# Patient Record
Sex: Female | Born: 1970
Health system: Southern US, Community
[De-identification: ages and names within clinical notes are randomized; demographics above are authoritative.]

## PROBLEM LIST (undated history)

## (undated) VITALS — BP 122/81 | HR 64 | Temp 98.1°F | Resp 18 | Ht 60.75 in | Wt 295.0 lb

## (undated) DIAGNOSIS — K589 Irritable bowel syndrome without diarrhea: Secondary | ICD-10-CM

## (undated) DIAGNOSIS — E119 Type 2 diabetes mellitus without complications: Secondary | ICD-10-CM

## (undated) DIAGNOSIS — F419 Anxiety disorder, unspecified: Secondary | ICD-10-CM

## (undated) DIAGNOSIS — D649 Anemia, unspecified: Secondary | ICD-10-CM

## (undated) DIAGNOSIS — F329 Major depressive disorder, single episode, unspecified: Secondary | ICD-10-CM

## (undated) DIAGNOSIS — G43009 Migraine without aura, not intractable, without status migrainosus: Secondary | ICD-10-CM

## (undated) DIAGNOSIS — G4730 Sleep apnea, unspecified: Secondary | ICD-10-CM

## (undated) DIAGNOSIS — I10 Essential (primary) hypertension: Secondary | ICD-10-CM

## (undated) DIAGNOSIS — F319 Bipolar disorder, unspecified: Secondary | ICD-10-CM

## (undated) DIAGNOSIS — E669 Obesity, unspecified: Secondary | ICD-10-CM

## (undated) DIAGNOSIS — F32A Depression, unspecified: Secondary | ICD-10-CM

## (undated) DIAGNOSIS — F70 Mild intellectual disabilities: Secondary | ICD-10-CM

## (undated) DIAGNOSIS — G40219 Localization-related (focal) (partial) symptomatic epilepsy and epileptic syndromes with complex partial seizures, intractable, without status epilepticus: Secondary | ICD-10-CM

## (undated) DIAGNOSIS — R569 Unspecified convulsions: Secondary | ICD-10-CM

## (undated) DIAGNOSIS — I639 Cerebral infarction, unspecified: Secondary | ICD-10-CM

## (undated) HISTORY — DX: Migraine without aura, not intractable, without status migrainosus: G43.009

## (undated) HISTORY — DX: Type 2 diabetes mellitus without complications: E11.9

## (undated) HISTORY — DX: Sleep apnea, unspecified: G47.30

## (undated) HISTORY — DX: Essential (primary) hypertension: I10

## (undated) HISTORY — DX: Localization-related (focal) (partial) symptomatic epilepsy and epileptic syndromes with complex partial seizures, intractable, without status epilepticus: G40.219

## (undated) HISTORY — DX: Obesity, unspecified: E66.9

## (undated) HISTORY — DX: Anemia, unspecified: D64.9

## (undated) HISTORY — DX: Mild intellectual disabilities: F70

## (undated) HISTORY — PX: COLONOSCOPY: SHX174

## (undated) HISTORY — DX: Unspecified convulsions: R56.9

## (undated) HISTORY — PX: NASAL SINUS SURGERY: SHX719

## (undated) HISTORY — DX: Anxiety disorder, unspecified: F41.9

## (undated) HISTORY — PX: ESOPHAGOGASTRODUODENOSCOPY: SHX1529

---

## 1998-03-21 ENCOUNTER — Encounter: Admission: RE | Admit: 1998-03-21 | Discharge: 1998-03-21 | Payer: Self-pay | Admitting: Family Medicine

## 1998-04-03 ENCOUNTER — Inpatient Hospital Stay (HOSPITAL_COMMUNITY): Admission: AD | Admit: 1998-04-03 | Discharge: 1998-04-06 | Payer: Self-pay | Admitting: Obstetrics

## 1998-05-25 ENCOUNTER — Encounter: Admission: RE | Admit: 1998-05-25 | Discharge: 1998-05-25 | Payer: Self-pay | Admitting: Sports Medicine

## 1998-06-19 ENCOUNTER — Encounter: Admission: RE | Admit: 1998-06-19 | Discharge: 1998-06-19 | Payer: Self-pay | Admitting: Family Medicine

## 1998-07-05 ENCOUNTER — Encounter: Admission: RE | Admit: 1998-07-05 | Discharge: 1998-07-05 | Payer: Self-pay | Admitting: Family Medicine

## 1998-10-05 ENCOUNTER — Encounter: Admission: RE | Admit: 1998-10-05 | Discharge: 1998-10-05 | Payer: Self-pay | Admitting: Family Medicine

## 1998-11-18 ENCOUNTER — Emergency Department (HOSPITAL_COMMUNITY): Admission: EM | Admit: 1998-11-18 | Discharge: 1998-11-18 | Payer: Self-pay | Admitting: Emergency Medicine

## 1999-10-23 ENCOUNTER — Encounter: Admission: RE | Admit: 1999-10-23 | Discharge: 1999-10-23 | Payer: Self-pay | Admitting: Sports Medicine

## 1999-12-07 ENCOUNTER — Emergency Department (HOSPITAL_COMMUNITY): Admission: EM | Admit: 1999-12-07 | Discharge: 1999-12-07 | Payer: Self-pay

## 1999-12-12 ENCOUNTER — Encounter: Admission: RE | Admit: 1999-12-12 | Discharge: 1999-12-12 | Payer: Self-pay | Admitting: Family Medicine

## 1999-12-31 ENCOUNTER — Emergency Department (HOSPITAL_COMMUNITY): Admission: EM | Admit: 1999-12-31 | Discharge: 2000-01-01 | Payer: Self-pay | Admitting: *Deleted

## 2000-01-01 ENCOUNTER — Encounter: Payer: Self-pay | Admitting: Emergency Medicine

## 2000-01-16 ENCOUNTER — Encounter: Admission: RE | Admit: 2000-01-16 | Discharge: 2000-01-16 | Payer: Self-pay | Admitting: Family Medicine

## 2000-07-10 ENCOUNTER — Emergency Department (HOSPITAL_COMMUNITY): Admission: EM | Admit: 2000-07-10 | Discharge: 2000-07-10 | Payer: Self-pay

## 2000-07-11 ENCOUNTER — Encounter: Payer: Self-pay | Admitting: Emergency Medicine

## 2001-02-23 ENCOUNTER — Emergency Department (HOSPITAL_COMMUNITY): Admission: EM | Admit: 2001-02-23 | Discharge: 2001-02-23 | Payer: Self-pay | Admitting: Emergency Medicine

## 2001-03-20 ENCOUNTER — Encounter: Admission: RE | Admit: 2001-03-20 | Discharge: 2001-03-20 | Payer: Self-pay | Admitting: Family Medicine

## 2001-07-27 ENCOUNTER — Ambulatory Visit (HOSPITAL_BASED_OUTPATIENT_CLINIC_OR_DEPARTMENT_OTHER): Admission: RE | Admit: 2001-07-27 | Discharge: 2001-07-27 | Payer: Self-pay | Admitting: Otolaryngology

## 2001-07-29 ENCOUNTER — Emergency Department (HOSPITAL_COMMUNITY): Admission: AC | Admit: 2001-07-29 | Discharge: 2001-07-30 | Payer: Self-pay

## 2001-07-29 ENCOUNTER — Encounter: Payer: Self-pay | Admitting: Emergency Medicine

## 2001-07-30 ENCOUNTER — Encounter: Payer: Self-pay | Admitting: Emergency Medicine

## 2001-07-30 ENCOUNTER — Encounter: Admission: RE | Admit: 2001-07-30 | Discharge: 2001-07-30 | Payer: Self-pay | Admitting: Family Medicine

## 2001-08-12 ENCOUNTER — Encounter: Admission: RE | Admit: 2001-08-12 | Discharge: 2001-08-12 | Payer: Self-pay | Admitting: Family Medicine

## 2001-12-17 ENCOUNTER — Emergency Department (HOSPITAL_COMMUNITY): Admission: EM | Admit: 2001-12-17 | Discharge: 2001-12-17 | Payer: Self-pay | Admitting: Emergency Medicine

## 2003-04-01 ENCOUNTER — Encounter: Admission: RE | Admit: 2003-04-01 | Discharge: 2003-04-01 | Payer: Self-pay | Admitting: Sports Medicine

## 2003-04-01 ENCOUNTER — Encounter: Payer: Self-pay | Admitting: Sports Medicine

## 2003-04-01 ENCOUNTER — Encounter: Admission: RE | Admit: 2003-04-01 | Discharge: 2003-04-01 | Payer: Self-pay | Admitting: Family Medicine

## 2003-04-14 ENCOUNTER — Encounter: Admission: RE | Admit: 2003-04-14 | Discharge: 2003-04-14 | Payer: Self-pay | Admitting: Family Medicine

## 2003-04-22 ENCOUNTER — Encounter: Admission: RE | Admit: 2003-04-22 | Discharge: 2003-04-22 | Payer: Self-pay | Admitting: Family Medicine

## 2003-04-28 ENCOUNTER — Encounter: Admission: RE | Admit: 2003-04-28 | Discharge: 2003-04-28 | Payer: Self-pay | Admitting: Sports Medicine

## 2003-04-28 ENCOUNTER — Other Ambulatory Visit: Admission: RE | Admit: 2003-04-28 | Discharge: 2003-04-28 | Payer: Self-pay | Admitting: Family Medicine

## 2003-05-18 ENCOUNTER — Ambulatory Visit (HOSPITAL_BASED_OUTPATIENT_CLINIC_OR_DEPARTMENT_OTHER): Admission: RE | Admit: 2003-05-18 | Discharge: 2003-05-18 | Payer: Self-pay | Admitting: Otolaryngology

## 2003-05-20 ENCOUNTER — Encounter: Admission: RE | Admit: 2003-05-20 | Discharge: 2003-05-20 | Payer: Self-pay | Admitting: Family Medicine

## 2003-06-11 ENCOUNTER — Emergency Department (HOSPITAL_COMMUNITY): Admission: EM | Admit: 2003-06-11 | Discharge: 2003-06-11 | Payer: Self-pay | Admitting: Emergency Medicine

## 2003-07-24 ENCOUNTER — Emergency Department (HOSPITAL_COMMUNITY): Admission: EM | Admit: 2003-07-24 | Discharge: 2003-07-24 | Payer: Self-pay

## 2003-07-27 ENCOUNTER — Encounter: Admission: RE | Admit: 2003-07-27 | Discharge: 2003-07-27 | Payer: Self-pay | Admitting: Family Medicine

## 2003-08-01 ENCOUNTER — Ambulatory Visit (HOSPITAL_COMMUNITY): Admission: RE | Admit: 2003-08-01 | Discharge: 2003-08-01 | Payer: Self-pay | Admitting: Sports Medicine

## 2003-08-05 ENCOUNTER — Encounter: Admission: RE | Admit: 2003-08-05 | Discharge: 2003-08-05 | Payer: Self-pay | Admitting: Family Medicine

## 2003-08-29 ENCOUNTER — Emergency Department (HOSPITAL_COMMUNITY): Admission: EM | Admit: 2003-08-29 | Discharge: 2003-08-29 | Payer: Self-pay | Admitting: Emergency Medicine

## 2003-09-09 ENCOUNTER — Emergency Department (HOSPITAL_COMMUNITY): Admission: EM | Admit: 2003-09-09 | Discharge: 2003-09-09 | Payer: Self-pay | Admitting: Emergency Medicine

## 2003-09-09 ENCOUNTER — Encounter: Payer: Self-pay | Admitting: Emergency Medicine

## 2003-10-12 ENCOUNTER — Encounter: Admission: RE | Admit: 2003-10-12 | Discharge: 2003-10-12 | Payer: Self-pay | Admitting: Family Medicine

## 2003-12-08 ENCOUNTER — Encounter: Admission: RE | Admit: 2003-12-08 | Discharge: 2003-12-08 | Payer: Self-pay | Admitting: Family Medicine

## 2004-01-16 ENCOUNTER — Encounter: Admission: RE | Admit: 2004-01-16 | Discharge: 2004-01-16 | Payer: Self-pay | Admitting: Family Medicine

## 2004-01-18 ENCOUNTER — Ambulatory Visit (HOSPITAL_COMMUNITY): Admission: RE | Admit: 2004-01-18 | Discharge: 2004-01-19 | Payer: Self-pay | Admitting: Otolaryngology

## 2004-01-20 ENCOUNTER — Emergency Department (HOSPITAL_COMMUNITY): Admission: AD | Admit: 2004-01-20 | Discharge: 2004-01-20 | Payer: Self-pay | Admitting: Emergency Medicine

## 2004-03-22 ENCOUNTER — Encounter: Admission: RE | Admit: 2004-03-22 | Discharge: 2004-03-22 | Payer: Self-pay | Admitting: Family Medicine

## 2004-06-08 ENCOUNTER — Encounter (INDEPENDENT_AMBULATORY_CARE_PROVIDER_SITE_OTHER): Payer: Self-pay | Admitting: *Deleted

## 2004-06-08 LAB — CONVERTED CEMR LAB

## 2004-06-29 ENCOUNTER — Other Ambulatory Visit: Admission: RE | Admit: 2004-06-29 | Discharge: 2004-06-29 | Payer: Self-pay | Admitting: Family Medicine

## 2004-06-29 ENCOUNTER — Encounter: Admission: RE | Admit: 2004-06-29 | Discharge: 2004-06-29 | Payer: Self-pay | Admitting: Family Medicine

## 2004-10-30 ENCOUNTER — Ambulatory Visit: Payer: Self-pay | Admitting: Family Medicine

## 2004-11-16 ENCOUNTER — Emergency Department (HOSPITAL_COMMUNITY): Admission: EM | Admit: 2004-11-16 | Discharge: 2004-11-16 | Payer: Self-pay | Admitting: Emergency Medicine

## 2004-12-06 ENCOUNTER — Ambulatory Visit: Payer: Self-pay | Admitting: Family Medicine

## 2005-02-21 ENCOUNTER — Emergency Department (HOSPITAL_COMMUNITY): Admission: EM | Admit: 2005-02-21 | Discharge: 2005-02-21 | Payer: Self-pay | Admitting: Emergency Medicine

## 2005-02-26 ENCOUNTER — Ambulatory Visit: Payer: Self-pay | Admitting: Psychiatry

## 2005-02-26 ENCOUNTER — Inpatient Hospital Stay (HOSPITAL_COMMUNITY): Admission: AD | Admit: 2005-02-26 | Discharge: 2005-03-02 | Payer: Self-pay | Admitting: Psychiatry

## 2005-03-22 ENCOUNTER — Inpatient Hospital Stay (HOSPITAL_COMMUNITY): Admission: EM | Admit: 2005-03-22 | Discharge: 2005-04-04 | Payer: Self-pay | Admitting: Psychiatry

## 2005-04-02 ENCOUNTER — Encounter: Payer: Self-pay | Admitting: Emergency Medicine

## 2005-05-27 ENCOUNTER — Ambulatory Visit: Payer: Self-pay | Admitting: Sports Medicine

## 2005-06-28 ENCOUNTER — Ambulatory Visit: Payer: Self-pay | Admitting: Family Medicine

## 2005-07-03 ENCOUNTER — Ambulatory Visit: Payer: Self-pay | Admitting: Psychiatry

## 2005-07-03 ENCOUNTER — Inpatient Hospital Stay (HOSPITAL_COMMUNITY): Admission: RE | Admit: 2005-07-03 | Discharge: 2005-07-05 | Payer: Self-pay | Admitting: Psychiatry

## 2005-07-12 ENCOUNTER — Ambulatory Visit: Payer: Self-pay | Admitting: Family Medicine

## 2005-11-12 ENCOUNTER — Ambulatory Visit: Payer: Self-pay | Admitting: Sports Medicine

## 2005-12-12 ENCOUNTER — Ambulatory Visit: Payer: Self-pay | Admitting: Family Medicine

## 2006-02-21 ENCOUNTER — Emergency Department (HOSPITAL_COMMUNITY): Admission: EM | Admit: 2006-02-21 | Discharge: 2006-02-21 | Payer: Self-pay | Admitting: Emergency Medicine

## 2006-02-26 ENCOUNTER — Ambulatory Visit: Payer: Self-pay | Admitting: Family Medicine

## 2006-02-26 ENCOUNTER — Ambulatory Visit (HOSPITAL_COMMUNITY): Admission: RE | Admit: 2006-02-26 | Discharge: 2006-02-26 | Payer: Self-pay | Admitting: Family Medicine

## 2006-03-05 ENCOUNTER — Emergency Department (HOSPITAL_COMMUNITY): Admission: EM | Admit: 2006-03-05 | Discharge: 2006-03-05 | Payer: Self-pay | Admitting: Emergency Medicine

## 2006-06-27 ENCOUNTER — Ambulatory Visit: Payer: Self-pay | Admitting: Family Medicine

## 2006-07-07 ENCOUNTER — Ambulatory Visit: Payer: Self-pay | Admitting: Family Medicine

## 2006-07-31 ENCOUNTER — Emergency Department (HOSPITAL_COMMUNITY): Admission: EM | Admit: 2006-07-31 | Discharge: 2006-08-01 | Payer: Self-pay | Admitting: Emergency Medicine

## 2006-08-20 ENCOUNTER — Encounter: Admission: RE | Admit: 2006-08-20 | Discharge: 2006-08-20 | Payer: Self-pay | Admitting: Sports Medicine

## 2006-09-25 ENCOUNTER — Emergency Department (HOSPITAL_COMMUNITY): Admission: EM | Admit: 2006-09-25 | Discharge: 2006-09-25 | Payer: Self-pay | Admitting: Emergency Medicine

## 2007-02-05 DIAGNOSIS — M479 Spondylosis, unspecified: Secondary | ICD-10-CM | POA: Insufficient documentation

## 2007-02-05 DIAGNOSIS — M25569 Pain in unspecified knee: Secondary | ICD-10-CM

## 2007-02-05 DIAGNOSIS — G40909 Epilepsy, unspecified, not intractable, without status epilepticus: Secondary | ICD-10-CM | POA: Insufficient documentation

## 2007-02-05 DIAGNOSIS — F070 Personality change due to known physiological condition: Secondary | ICD-10-CM

## 2007-02-06 ENCOUNTER — Encounter (INDEPENDENT_AMBULATORY_CARE_PROVIDER_SITE_OTHER): Payer: Self-pay | Admitting: *Deleted

## 2007-04-01 ENCOUNTER — Ambulatory Visit: Payer: Self-pay | Admitting: Sports Medicine

## 2007-04-01 ENCOUNTER — Encounter (INDEPENDENT_AMBULATORY_CARE_PROVIDER_SITE_OTHER): Payer: Self-pay | Admitting: Family Medicine

## 2007-04-01 DIAGNOSIS — L989 Disorder of the skin and subcutaneous tissue, unspecified: Secondary | ICD-10-CM | POA: Insufficient documentation

## 2007-04-02 ENCOUNTER — Telehealth: Payer: Self-pay | Admitting: *Deleted

## 2007-04-06 LAB — CONVERTED CEMR LAB: Chlamydia, DNA Probe: NEGATIVE

## 2007-04-13 ENCOUNTER — Telehealth (INDEPENDENT_AMBULATORY_CARE_PROVIDER_SITE_OTHER): Payer: Self-pay | Admitting: Family Medicine

## 2007-04-21 ENCOUNTER — Telehealth (INDEPENDENT_AMBULATORY_CARE_PROVIDER_SITE_OTHER): Payer: Self-pay | Admitting: *Deleted

## 2007-04-23 ENCOUNTER — Encounter (INDEPENDENT_AMBULATORY_CARE_PROVIDER_SITE_OTHER): Payer: Self-pay | Admitting: Family Medicine

## 2007-04-23 ENCOUNTER — Ambulatory Visit: Payer: Self-pay | Admitting: Family Medicine

## 2007-04-23 DIAGNOSIS — N9089 Other specified noninflammatory disorders of vulva and perineum: Secondary | ICD-10-CM

## 2007-04-24 ENCOUNTER — Encounter (INDEPENDENT_AMBULATORY_CARE_PROVIDER_SITE_OTHER): Payer: Self-pay | Admitting: *Deleted

## 2007-05-08 ENCOUNTER — Ambulatory Visit: Payer: Self-pay | Admitting: Family Medicine

## 2007-05-08 DIAGNOSIS — I868 Varicose veins of other specified sites: Secondary | ICD-10-CM

## 2007-05-15 ENCOUNTER — Ambulatory Visit: Payer: Self-pay | Admitting: Sports Medicine

## 2007-05-20 ENCOUNTER — Ambulatory Visit: Payer: Self-pay | Admitting: Family Medicine

## 2007-05-20 DIAGNOSIS — Z9889 Other specified postprocedural states: Secondary | ICD-10-CM

## 2007-05-22 ENCOUNTER — Ambulatory Visit: Payer: Self-pay | Admitting: Family Medicine

## 2007-05-24 ENCOUNTER — Emergency Department (HOSPITAL_COMMUNITY): Admission: EM | Admit: 2007-05-24 | Discharge: 2007-05-24 | Payer: Self-pay | Admitting: Family Medicine

## 2007-06-18 ENCOUNTER — Encounter: Payer: Self-pay | Admitting: Family Medicine

## 2007-06-18 ENCOUNTER — Encounter (INDEPENDENT_AMBULATORY_CARE_PROVIDER_SITE_OTHER): Payer: Self-pay | Admitting: Family Medicine

## 2007-12-11 ENCOUNTER — Telehealth (INDEPENDENT_AMBULATORY_CARE_PROVIDER_SITE_OTHER): Payer: Self-pay | Admitting: *Deleted

## 2007-12-11 ENCOUNTER — Ambulatory Visit: Payer: Self-pay | Admitting: Family Medicine

## 2008-05-04 ENCOUNTER — Encounter: Payer: Self-pay | Admitting: Family Medicine

## 2008-05-06 ENCOUNTER — Ambulatory Visit (HOSPITAL_BASED_OUTPATIENT_CLINIC_OR_DEPARTMENT_OTHER): Admission: RE | Admit: 2008-05-06 | Discharge: 2008-05-06 | Payer: Self-pay | Admitting: Otolaryngology

## 2008-05-14 ENCOUNTER — Ambulatory Visit: Payer: Self-pay | Admitting: Internal Medicine

## 2008-07-11 ENCOUNTER — Ambulatory Visit (HOSPITAL_COMMUNITY): Admission: RE | Admit: 2008-07-11 | Discharge: 2008-07-11 | Payer: Self-pay | Admitting: Emergency Medicine

## 2008-07-11 ENCOUNTER — Emergency Department (HOSPITAL_COMMUNITY): Admission: EM | Admit: 2008-07-11 | Discharge: 2008-07-11 | Payer: Self-pay | Admitting: Emergency Medicine

## 2008-07-27 ENCOUNTER — Ambulatory Visit: Payer: Self-pay | Admitting: Family Medicine

## 2008-07-27 DIAGNOSIS — I10 Essential (primary) hypertension: Secondary | ICD-10-CM

## 2008-10-03 ENCOUNTER — Ambulatory Visit: Payer: Self-pay | Admitting: Psychology

## 2009-04-20 ENCOUNTER — Encounter: Payer: Self-pay | Admitting: Family Medicine

## 2009-06-20 ENCOUNTER — Encounter: Payer: Self-pay | Admitting: Family Medicine

## 2010-05-15 ENCOUNTER — Emergency Department (HOSPITAL_COMMUNITY): Admission: EM | Admit: 2010-05-15 | Discharge: 2010-05-15 | Payer: Self-pay | Admitting: Emergency Medicine

## 2010-08-25 ENCOUNTER — Emergency Department (HOSPITAL_COMMUNITY): Admission: EM | Admit: 2010-08-25 | Discharge: 2010-08-25 | Payer: Self-pay | Admitting: Emergency Medicine

## 2010-12-09 ENCOUNTER — Emergency Department (HOSPITAL_COMMUNITY)
Admission: EM | Admit: 2010-12-09 | Discharge: 2010-12-09 | Payer: Self-pay | Source: Home / Self Care | Admitting: Family Medicine

## 2010-12-30 ENCOUNTER — Encounter: Payer: Self-pay | Admitting: Family Medicine

## 2011-01-27 ENCOUNTER — Encounter: Payer: Self-pay | Admitting: *Deleted

## 2011-01-28 ENCOUNTER — Encounter (INDEPENDENT_AMBULATORY_CARE_PROVIDER_SITE_OTHER): Payer: Self-pay | Admitting: *Deleted

## 2011-02-05 NOTE — Letter (Addendum)
Summary: New Patient letter  [facility]  [address]   [place], [place] [address]   Phone: [phone]  Fax: [phone]       01/28/2011 MRN: [id number]  [facility] [address] [place], [place]  [address]  Dear Ms. Gammell,  Welcome to the Gastroenterology Division at [facility].    You are scheduled to see Dr.  Jarold Motto on 03/05/2011 at 3:15 on the 3rd floor at [facility], [address]  We ask that you try to arrive at our office 15 minutes prior to your appointment time to allow for check-in.  We would like you to complete the enclosed self-administered evaluation form prior to your visit and bring it with you on the day of your appointment.  We will review it with you.  Also, please bring a complete list of all your medications or, if you prefer, bring the medication bottles and we will list them.  Please bring your insurance card so that we may make a copy of it.  If your insurance requires a referral to see a specialist, please bring your referral form from your primary care physician.  Co-payments are due at the time of your visit and may be paid by cash, check or credit card.     Your office visit will consist of a consult with your physician (includes a physical exam), any laboratory testing he/she may order, scheduling of any necessary diagnostic testing (e.g. x-ray, ultrasound, CT-scan), and scheduling of a procedure (e.g. Endoscopy, Colonoscopy) if required.  Please allow enough time on your schedule to allow for any/all of these possibilities.    If you cannot keep your appointment, please call [phone] to cancel or reschedule prior to your appointment date.  This allows [place] the opportunity to schedule an appointment for another patient in need of care.  If you do not cancel or reschedule by 5 p.m. the business day prior to your appointment date, you will be charged a $50.00 late cancellation/no-show fee.    Thank you for  choosing [facility] for your medical needs.  We appreciate the opportunity to care for you.  Please visit [place] at our website  to learn more about our practice.                     Sincerely,                                                             The Gastroenterology Division

## 2011-02-21 LAB — GLUCOSE, CAPILLARY: Glucose-Capillary: 181 mg/dL — ABNORMAL HIGH (ref 70–99)

## 2011-03-05 ENCOUNTER — Other Ambulatory Visit (INDEPENDENT_AMBULATORY_CARE_PROVIDER_SITE_OTHER): Payer: Self-pay

## 2011-03-05 ENCOUNTER — Encounter: Payer: Self-pay | Admitting: Gastroenterology

## 2011-03-05 ENCOUNTER — Ambulatory Visit (INDEPENDENT_AMBULATORY_CARE_PROVIDER_SITE_OTHER): Payer: Self-pay | Admitting: Gastroenterology

## 2011-03-05 VITALS — BP 120/80 | HR 72 | Ht 60.0 in | Wt 287.8 lb

## 2011-03-05 DIAGNOSIS — R1032 Left lower quadrant pain: Secondary | ICD-10-CM

## 2011-03-05 DIAGNOSIS — R197 Diarrhea, unspecified: Secondary | ICD-10-CM

## 2011-03-05 DIAGNOSIS — E119 Type 2 diabetes mellitus without complications: Secondary | ICD-10-CM

## 2011-03-05 LAB — CBC WITH DIFFERENTIAL/PLATELET
Eosinophils Relative: 1.4 % (ref 0.0–5.0)
HCT: 32.9 % — ABNORMAL LOW (ref 36.0–46.0)
Hemoglobin: 11 g/dL — ABNORMAL LOW (ref 12.0–15.0)
Lymphocytes Relative: 28.2 % (ref 12.0–46.0)
Lymphs Abs: 2.5 10*3/uL (ref 0.7–4.0)
Monocytes Relative: 4.6 % (ref 3.0–12.0)
Neutro Abs: 5.8 10*3/uL (ref 1.4–7.7)
RBC: 3.52 Mil/uL — ABNORMAL LOW (ref 3.87–5.11)
WBC: 8.8 10*3/uL (ref 4.5–10.5)

## 2011-03-05 LAB — BASIC METABOLIC PANEL
BUN: 12 mg/dL (ref 6–23)
Calcium: 8.6 mg/dL (ref 8.4–10.5)
Creatinine, Ser: 0.7 mg/dL (ref 0.4–1.2)
GFR: 114.23 mL/min (ref >60.00)
Potassium: 4.5 mEq/L (ref 3.5–5.1)

## 2011-03-05 LAB — SEDIMENTATION RATE: Sed Rate: 62 mm/hr — ABNORMAL HIGH (ref 0–22)

## 2011-03-05 LAB — HEPATIC FUNCTION PANEL
Albumin: 3.4 g/dL — ABNORMAL LOW (ref 3.5–5.2)
Total Protein: 7.2 g/dL (ref 6.0–8.3)

## 2011-03-05 LAB — IBC PANEL
Iron: 33 ug/dL — ABNORMAL LOW (ref 42–145)
Transferrin: 211.4 mg/dL — ABNORMAL LOW (ref 212.0–360.0)

## 2011-03-05 MED ORDER — PEG-KCL-NACL-NASULF-NA ASC-C 100 G PO SOLR: 1.0000 | Freq: Once | ORAL | Status: AC

## 2011-03-05 MED ORDER — HYOSCYAMINE SULFATE 0.125 MG PO TABS: 0.1250 mg | ORAL_TABLET | ORAL | Status: AC | PRN

## 2011-03-05 NOTE — Progress Notes (Signed)
History of Present Illness:  This is a  40 year old African American female referred by primary care for evaluation of 6 months of crampy lower abdominal pain and intermittent salt diarrhea type stools without rectal bleeding or melena. She denies infectious disease exposure or new medications. She does suffer from a chronic seizure disorder and is on Dilantin and also oral medications for her type 2 diabetes. She apparently was treated for diverticulitis one month ago without much symptomatic improvement. She has not had previous GI evaluations. Review of her labs show that she does have a mild chronic anemia of unexplained etiology. Apparently  She passed what sounds like a possible worm  Several weeks ago. She does have known lactose intolerance.    ROS: The remainder of the 10 point ROS is negative-- the patient does complain of  seasonal allergies, chronic anxiety and depression, mild confusion, chronic fatigue, menstrual cramps, excessive thirst and urination, the swelling of her lower extremities. She also has sleep apnea and uses a CPAP machine. Apparently she has a known ovarian cyst Past Medical History  Diagnosis Date  . Type II or unspecified type diabetes mellitus without mention of complication, not stated as uncontrolled   . Hypertension   . Seizures   . Sleep apnea   . Anxiety    Past Surgical History  Procedure Date  . Nasal sinus surgery     reports that she has never smoked. She does not have any smokeless tobacco history on file. She reports that she does not drink alcohol or use illicit drugs. family history includes Diabetes in her mother. Allergies  Allergen Reactions  . Hydrocodone         Physical Exam: General well developed well nourished patient in no acute distress, appearing their stated age.. Morbid  Obesity noted.Eyes PERRLA, no icterus fundoscopic exam per opthamologist Skin no lesions noted Neck supple, no adenopathy, no thyroid enlargement, no  tenderness Chest clear to percussion and auscultation Heart no significant murmurs, gallops or rubs noted Abdomen no hepatosplenomegaly masses or tenderness, BS normal.  . Extremities no acute joint lesions, edema, phlebitis or evidence of cellulitis. Neurologic patient oriented x 3, cranial nerves intact, no focal neurologic deficits noted. Psychological mental status normal and normal affect.  Assessment and plan: probable diarrhea predominant    Irritable bowel syndrome-rule out inflammatory bowel disease. The patient does have 2 lactose intolerance, she also needs to have test for intestinal parasites. Stool exams and  blood tests ordered.  Including celiac profile and Dilantin level. We have prescribed when necessary sublingual Levsin for abdominal cramps pending further evaluation. Adjustments remainder medications for colonoscopy procedure. Her diabetes appears to be under fairly good control at this time. Recent hemoglobin A1c was 6.6%.

## 2011-03-05 NOTE — Patient Instructions (Signed)
Your procedure has been scheduled for 03/08/2011, please follow the seperate instructions.  Please go to the basement today for your labs.  Your prescription(s) have been sent to you pharmacy.

## 2011-03-06 ENCOUNTER — Telehealth: Payer: Self-pay | Admitting: *Deleted

## 2011-03-06 LAB — PHENYTOIN LEVEL, TOTAL: Phenytoin Lvl: 13 ug/mL (ref 10.0–20.0)

## 2011-03-06 LAB — GLIA (IGA/G) + TTG IGA
Gliadin IgG: 22.4 U/mL — ABNORMAL HIGH (ref <20)
Tissue Transglutaminase Ab, IgA: 6.1 U/mL (ref <20)

## 2011-03-06 LAB — TSH: TSH: 1.17 u[IU]/mL (ref 0.35–5.50)

## 2011-03-06 MED ORDER — FERROUS SULFATE 325 (65 FE) MG PO TABS: 325.0000 mg | ORAL_TABLET | Freq: Two times a day (BID) | ORAL | Status: DC

## 2011-03-06 MED ORDER — FOLIC ACID 1 MG PO TABS: 1.0000 mg | ORAL_TABLET | Freq: Every day | ORAL | Status: AC

## 2011-03-06 NOTE — Telephone Encounter (Signed)
There are no working numbers in the patients chart I will mail her a letter. And rxs sent

## 2011-03-06 NOTE — Telephone Encounter (Signed)
Message copied by Harlow Mares on Wed Mar 06, 2011  3:17 PM ------      Message from: Jarold Motto, DAVID      Created: Wed Mar 06, 2011 11:18 AM       B12 RX,FOLIC ACID 1 MG/AY AND FERROUS SULFATE 325 MG BID.Marland KitchenMarland Kitchen

## 2011-03-07 ENCOUNTER — Encounter: Payer: Self-pay | Admitting: Gastroenterology

## 2011-03-08 ENCOUNTER — Ambulatory Visit (AMBULATORY_SURGERY_CENTER): Payer: Medicare Other | Admitting: Gastroenterology

## 2011-03-08 ENCOUNTER — Other Ambulatory Visit: Payer: Self-pay | Admitting: Gastroenterology

## 2011-03-08 DIAGNOSIS — R109 Unspecified abdominal pain: Secondary | ICD-10-CM

## 2011-03-08 DIAGNOSIS — R1032 Left lower quadrant pain: Secondary | ICD-10-CM

## 2011-03-08 DIAGNOSIS — D126 Benign neoplasm of colon, unspecified: Secondary | ICD-10-CM

## 2011-03-08 DIAGNOSIS — K589 Irritable bowel syndrome without diarrhea: Secondary | ICD-10-CM

## 2011-03-08 DIAGNOSIS — R197 Diarrhea, unspecified: Secondary | ICD-10-CM

## 2011-03-08 NOTE — Patient Instructions (Signed)
Discharged instructions given with verbal understanding. Reschedule to come back for endoscopy. 03-20-11 at 11;00. Also previsit on 03-13-11 at 10;30am.

## 2011-03-11 ENCOUNTER — Telehealth: Payer: Self-pay

## 2011-03-11 LAB — GLUCOSE, CAPILLARY: Glucose-Capillary: 93 mg/dL (ref 70–99)

## 2011-03-11 NOTE — Telephone Encounter (Signed)
No answer. No ID on message. Unable to leave message.

## 2011-03-12 NOTE — Procedures (Signed)
Summary: Colonoscopy  Patient: Brandi Gomez Note: All result statuses are Final unless otherwise noted.  Tests: (1) Colonoscopy (COL)   COL Colonoscopy           DONE      [facility]     [address]     [place], [place]  [address]          COLONOSCOPY PROCEDURE REPORT          PATIENT:  [name], [name]  MR#:  [id number]     BIRTHDATE:  [birth year], 40 yrs. old  GENDER:  female     ENDOSCOPIST:  Vania Rea. Jarold Motto, MD, [facility]     REF. BY:     PROCEDURE DATE:  03/08/2011     PROCEDURE:  Colonoscopy with biopsy     ASA CLASS:  Class III     INDICATIONS:  Abdominal pain, unexplained diarrhea     MEDICATIONS:   Fentanyl 100 mcg IV, Versed 10 mg IV          DESCRIPTION OF PROCEDURE:   After the risks benefits and     alternatives of the procedure were thoroughly explained, informed     consent was obtained.  Digital rectal exam was performed and     revealed no abnormalities.   The LB CF-[id number] endoscope     was introduced through the anus and advanced to the terminal ileum     which was intubated for a short distance, without limitations.     The quality of the prep was excellent, using MoviPrep.  The     instrument was then slowly withdrawn as the colon was fully     examined.     <<PROCEDUREIMAGES>>     FINDINGS:  A normal appearing cecum, ileocecal valve, and     appendiceal orifice were identified. The ascending, hepatic     flexure, transverse, splenic flexure, descending, sigmoid colon,     and rectum appeared unremarkable. random biopsies done.     Retroflexed v     iews in the rectum revealed no abnormalities.    The scope was     then withdrawn from the patient and the procedure completed.          COMPLICATIONS:  None     ENDOSCOPIC IMPRESSION:     1) Normal colon     IBS.R/O MICROSCOPIC COLITIS.R/O CELIAC DISEASE ASSOCIATED WITH     DIABETES.     RECOMMENDATIONS:     1.ENDOSCOPY AND SI BX. NEEDED     2.LEVSIN 0.125 MG Q6H PRN     REPEAT  EXAM:  No          ______________________________     Vania Rea. Jarold Motto, MD, Clementeen Graham          CC:          n.     eSIGNED:   Vania Rea. Patterson at 03/08/2011 02:29 PM          Vilma Prader, [id number]  Note: An exclamation mark (!) indicates a result that was not dispersed into the flowsheet. Document Creation Date: 03/08/2011 2:30 PM _______________________________________________________________________  (1) Order result status: Final Collection or observation date-time: 03/08/2011 14:15 Requested date-time:  Receipt date-time:  Reported date-time:  Referring Physician:   Ordering Physician: Sheryn Bison [phone]) Specimen Source:  Source: Launa Grill Order Number: [id number] Lab site:

## 2011-03-13 ENCOUNTER — Encounter: Payer: Medicare Other | Admitting: *Deleted

## 2011-03-19 ENCOUNTER — Ambulatory Visit (AMBULATORY_SURGERY_CENTER): Payer: Medicare Other | Admitting: *Deleted

## 2011-03-19 ENCOUNTER — Telehealth: Payer: Self-pay | Admitting: Gastroenterology

## 2011-03-19 VITALS — Ht 60.0 in | Wt 290.0 lb

## 2011-03-19 DIAGNOSIS — R197 Diarrhea, unspecified: Secondary | ICD-10-CM

## 2011-03-19 DIAGNOSIS — R109 Unspecified abdominal pain: Secondary | ICD-10-CM

## 2011-03-19 MED ORDER — DICYCLOMINE HCL 10 MG PO CAPS: 10.0000 mg | ORAL_CAPSULE | Freq: Three times a day (TID) | ORAL | Status: DC

## 2011-03-19 NOTE — Telephone Encounter (Signed)
Patient complains that her meds were denied by her insurance and she does not know the names of them. I called the pharm and they state that the patient did not come pick up the iron and folic acid which the iron was 0 and the folic acid is $1.61 when I called the patient back she says that she does not have the money to get those meds and she was just wanting her pain medication. I have advised her her Iron was free and she really needs to get the iron and folic acid and start it ASAP. I looked back on the procedure report and Dr Jarold Motto asked for levsin to be sent but it will not be covered by her insurance so I have sent Bentyl.

## 2011-03-20 ENCOUNTER — Other Ambulatory Visit: Payer: Medicare Other | Admitting: Gastroenterology

## 2011-03-22 ENCOUNTER — Encounter: Payer: Self-pay | Admitting: Gastroenterology

## 2011-03-25 ENCOUNTER — Ambulatory Visit (AMBULATORY_SURGERY_CENTER): Payer: Medicare Other | Admitting: Gastroenterology

## 2011-03-25 ENCOUNTER — Encounter: Payer: Self-pay | Admitting: Gastroenterology

## 2011-03-25 DIAGNOSIS — D1330 Benign neoplasm of unspecified part of small intestine: Secondary | ICD-10-CM

## 2011-03-25 DIAGNOSIS — R197 Diarrhea, unspecified: Secondary | ICD-10-CM

## 2011-03-25 DIAGNOSIS — R1032 Left lower quadrant pain: Secondary | ICD-10-CM

## 2011-03-25 LAB — GLUCOSE, CAPILLARY
Glucose-Capillary: 109 mg/dL — ABNORMAL HIGH (ref 70–99)
Glucose-Capillary: 90 mg/dL (ref 70–99)

## 2011-03-25 MED ORDER — SODIUM CHLORIDE 0.9 % IV SOLN: 500.0000 mL | INTRAVENOUS | Status: DC

## 2011-03-25 NOTE — Patient Instructions (Signed)
Please read over discharge instruction handouts given. Normal exam today,biopsies taken. You will receive result letter in your mail in about 2-3 weeks. Blood sugar today was 90. Resume your regular medications after you eat your 1st meal today. Resume care with your primary physician.

## 2011-03-26 ENCOUNTER — Telehealth: Payer: Self-pay | Admitting: *Deleted

## 2011-03-26 DIAGNOSIS — R1032 Left lower quadrant pain: Secondary | ICD-10-CM

## 2011-03-26 DIAGNOSIS — R197 Diarrhea, unspecified: Secondary | ICD-10-CM

## 2011-03-26 NOTE — Telephone Encounter (Signed)
No voice mail option on telephone number provided.

## 2011-03-27 ENCOUNTER — Encounter: Payer: Self-pay | Admitting: Gastroenterology

## 2011-03-29 ENCOUNTER — Telehealth: Payer: Self-pay | Admitting: *Deleted

## 2011-03-29 ENCOUNTER — Encounter: Payer: Self-pay | Admitting: Gastroenterology

## 2011-03-29 NOTE — Telephone Encounter (Signed)
Error in result note

## 2011-03-29 NOTE — Telephone Encounter (Signed)
NO///NOT HER PROBLEM

## 2011-03-29 NOTE — Telephone Encounter (Signed)
Dr Jarold Motto, H.Pylori was positive. I don't see any orders- do you want a PrevPak? Thanks.

## 2011-04-23 NOTE — Procedures (Signed)
Brandi Gomez, Brandi Gomez            ACCOUNT NO.:  [id number]   MEDICAL RECORD NO.:  [id number]          PATIENT TYPE:  OUT   LOCATION:  SLEEP CENTER                 FACILITY[facility]  [facility]   PHYSICIAN:  Clinton D. Maple Hudson, MD, FCCP, FACPDATE OF BIRTH:  07-27-69   DATE OF STUDY:  05/06/2008                            NOCTURNAL POLYSOMNOGRAM   REFERRING PHYSICIAN:   REFERRING PHYSICIAN:  Suzanna Obey, M.D.   INDICATION FOR STUDY:  Hypersomnia with sleep apnea.   EPWORTH SLEEPINESS SCORE:  15/24.  Height and weight were not provided.   MEDICATIONS:  Home medication charted and reviewed.   SLEEP ARCHITECTURE:  Split study protocol.  During the diagnostic phase  total sleep time was 138.5 minutes with sleep efficiency 86.3%.  Stage I  was 8.3%, stage II 91.7%, stage III and REM were absent.  Sleep latency  1.5 minutes.  Awake after sleep onset 16.5 minutes.  Arousal index 0.9.  Bedtime medication included Dilantin #4.  The patient said she forgot  the morning dose.  Also lorazepam and Lexapro.   RESPIRATORY DATA:  Split study protocol.  Apnea-hypopnea index (AHI)  34.7.  There were 10 obstructive apnea, one mixed apnea, 69 hypopneas.  The events were not positional.  CPAP was titrated to 17 CWP, AHI 0 per  hour.  She chose a small ResMed full-face Quattro mask wit heated  humidifier.   OXYGEN DATA:  Loud snoring before CPAP with oxygen desaturation to a  nadir of 80%.  After CPAP control mean oxygen saturation held at 92.1%  on room air.   CARDIAC DATA:  Sinus rhythm.   MOVEMENT-PARASOMNIA:  No significant movement disturbance.  Bathroom x1.   IMPRESSIONS-RECOMMENDATIONS:  1. Moderate obstructive sleep apnea/hypopnea syndrome, apnea-hypopnea      index 34.7 per hour with nonpositional events, loud snoring and      oxygen desaturation to a nadir of 87%.  2. Successful CPAP titration to 17 CWP, apnea-hypopnea index 0 per      hour.  She chose a small full-face Quattro mask with heated    humidifier.  3. The patient did not know her weight or height.  4. She indicates that she has oxygen intended for sleep at home, which      she does not use.  During the test, before CPAP control, she spent      40.5 minutes with oxygen saturation less than 88%.      Clinton D. Maple Hudson, MD, [facility], FACP  Diplomate, Biomedical engineer of Sleep Medicine  Electronically Signed     CDY/MEDQ  D:  05/14/2008 10:11:55  T:  05/14/2008 10:44:08  Job:  [id number]

## 2011-04-26 NOTE — Discharge Summary (Signed)
Brandi Gomez, CRUNK NO.:  [id number]   MEDICAL RECORD NO.:  [id number]          PATIENT TYPE:  IPS   LOCATION:  0406                          FACILITY:  [facility]   PHYSICIAN:  Geoffery Lyons, M.D.      DATE OF BIRTH:  10-11-1971   DATE OF ADMISSION:  03/22/2005  DATE OF DISCHARGE:  04/04/2005                                 DISCHARGE SUMMARY   CHIEF COMPLAINT AND PRESENT ILLNESS:  This was the second admission to [facility] for this 40 year old single African-American female  voluntarily admitted.  Complaining of suicidal ideation.  Planned to  overdose on her medication because of depression.  Depression is about her  finances.  She was working until about age 35 or 4 when she was found  sleeping and tested positive for drugs.  Currently on probation.  She has  three more years of probation.  She applied for disability.  She is frantic  that she cannot provide for her children.  Her 2 year old daughter has a 70-  month-old daughter.  Her 53 year old daughter and 44-year-old daughter are  always in the home.  Children are of different fathers.  The father of her 62-  year-old is her current boyfriend.  The boyfriend helped with the children  but she is obsessing about maintaining her independence.   PAST PSYCHIATRIC HISTORY:  She was admitted on February 26, 2005.   FAMILY HISTORY:  Mother was bipolar.   ALCOHOL/DRUG HISTORY:  Denies the use or abuse of any substances since she  abstained.   MEDICAL HISTORY:  Seizures, sleep apnea.   MEDICATIONS:  Ativan 1 mg at night, Celexa 20 mg in the morning, Aricept 10  mg at night, Dilantin 300 mg at night, Neurontin 600 mg three times a day.   PHYSICAL EXAMINATION:  Performed and failed to show any acute findings.   LABORATORY DATA:  Not available in the chart.   MENTAL STATUS EXAM:  Alert, cooperative female.  Appropriately groomed and  dressed.  Good eye contact.  Speech was normal rate, tempo and  production.  Mood was depressed, anxious.  Affect was depressed, anxious.  Thought  processes were clear, rational and goal-oriented but she was quite concrete  in her perceptions of the situation and her wanting solutions.  Wanted the  decision made about her financial assistance and social security disability.  There were no evidence of delusions.  There is a sense of hopelessness and  helplessness, feeling very overwhelmed.  Not knowing what to do with some  suicidal ruminations.  Cognition was well-preserved.   ADMISSION DIAGNOSES:   AXIS I:  1.  Major depression, recurrent.  2.  Borderline intellectual functioning.   AXIS II:  No diagnosis.   AXIS III:  1.  Sleep apnea.  2.  Obesity.  3.  Epilepsy.   AXIS IV:  Moderate.   AXIS V:  Global Assessment of Functioning upon admission 30; highest Global  Assessment of Functioning in the last year 60.   HOSPITAL COURSE:  She was admitted.  She was started in individual and group  psychotherapy.  She was given Ambien for sleep.  She was placed on the C-PAP  machine and given Ativan 1 mg at night, Celexa 20 mg in the morning, Aricept  10 mg at night, Dilantin 300 mg at night.  She was placed on Neurontin 600  mg three times a day.  Celexa was eventually increased to 30 mg per day and  then 40 mg.  Neurontin was increased to 600 mg four times a day.  It was  later decreased to 600 mg three times a day.  She endorsed that she was  having a hard time.  Endorsed that she has not been able to get her  disability.  The money is not coming.  She has all these financial  obligations.  Very upset.  Easily overwhelmed.  Ruminating, worrying,  wanting the lawyer to do something to help her out with the disability.  Felt that if she did not get the disability or get some money, she was able  to help the children, the family in the situation that they were.  Had a  difficult time with sleep due to her ruminating about the situation.  Somewhat  concrete in the way she was perceiving the situation.  On April  18th, stated that she was not going to take the medication until something  was done to help her out.  Casemanager was actively involved.  Endorsed that  she was more depressed, more overwhelmed, very worried about the children.  She was somewhat persistent to share how bad things were because of the  possibility of them being removed from the house.  Due to the information  shared, a report was made to Department of Social Services.  She was wanting  to find out if the appeal had been filed by the lawyer's office, easily  overwhelmed.  The more she thought about her situation, she became more  tearful, more upset.  Worried about the situation at home.  Endorsed a lot  of anxiety.  She did respond to encouragement and to medication adjustment.  She had an episode of heaviness in her chest.  She was taken to the  emergency room where she was cleared.  The social worker, casemanager was  able to involve some other outside agencies to try to help her but she was  able to talk to the DSS officers while in the hospital.  She was relieved by  the fact that they were pretty receptive and willing to help her with her  situation.  As things started happening, she was a little bit more  optimistic.  Her mood improved.  Her affect became brighter.  She had  secured a placement and some other help from the community and, as long as  she was pursuing help and she was getting better, DSS was not going to  pursue removing the children.   DISCHARGE DIAGNOSES:   AXIS I:  1.  Major depression, recurrent.  2.  Borderline intellectual functioning.   AXIS II:  No diagnosis.   AXIS III:  1.  Sleep apnea.  2.  Obesity.  3.  Epilepsy.   AXIS IV:  Moderate.   AXIS V:  Global Assessment of Functioning upon discharge 50.   DISCHARGE MEDICATIONS:  1.  Aricept 10 mg daily.  2.  Lorazepam 1 mg at night. 3.  Dilantin 100 mg, 3 at bedtime.   4.  Celexa 40 mg daily.  5.  Vistaril 25 mg, take 1 at noon time.  6.  Neurontin 300 mg, 2 three times a day.  7.  Ambien 10 mg, 1 at bedtime as needed for sleep.   FOLLOW UP:  Tamala Fothergill and [facility].      [place]/MEDQ  D:  04/27/2005  T:  04/27/2005  Job:  [id number]
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
## 2011-04-26 NOTE — Consult Note (Signed)
[name], Gomez NO.:  [id number]   MEDICAL RECORD NO.:  [id number]          PATIENT TYPE:  IPS   LOCATION:  0507                          FACILITY:  [facility]   PHYSICIAN:  Brandi Gomez, M.D.      DATE OF BIRTH:  1971/08/25   DATE OF CONSULTATION:  DATE OF DISCHARGE:                                   CONSULTATION   REASON FOR CONSULTATION:  Seizures.   HISTORY OF PRESENT ILLNESS:  Brandi Gomez is a 40 year old obese African-  American lady who was admitted with suicidal ideations.  She has a history  nocturnal generalized tonic-clonic seizures in sleep over the last 3 years.  She has had 3 episodes; the first two occurred on nights when she had not  used her sleep apnea machine.  She had previously been on Neurontin for  behavioral health reasons.  She saw Dr. Anne Hahn in the office who started her  on Lamictal 50 twice a day.  The patient states she is quite noncompliant  with her medications and does not remember to take the second dose and often  ends up taking them only once a day.  She had generalized tonic-clonic  seizure in sleep about a week ago.  She states her boyfriend woke up because  of her thrashing movements.  She was incontinent of urine as well as stool,  this time she was fairly disoriented and confused for a short time after  that.  She denies any childhood history of epilepsy, seizures, febrile  seizure, significant head injury.   FAMILY HISTORY:  No significant for seizures.   PAST MEDICAL HISTORY:  Significant for obesity, depression, mild mental  retardation.   MEDICAL ALLERGIES:  None known.   PAST SURGICAL HISTORY:  None.   REVIEW OF SYSTEMS:  Significant for suicidal ideations and worsening  depression, no fever, chest, chest pain or diarrhea.   CURRENT MEDICATIONS:  1.  Lamictal. 2. Aricept. 3. Neurontin. 4. CPAP at night.   PHYSICAL EXAMINATION:  GENERAL:  Obese, African-American lady who is not in  distress.  VITAL SIGNS:  She  is afebrile, pulse rate 70, __________.  HEAD:  Nontraumatic.  NECK:  Supple without bruit.  ENT:  Exam is unremarkable.  CARDIAC:  No rub, murmur or gallop.  LUNGS:  Clear to auscultation.  NEUROLOGIC:  Patient is awake, alert, oriented x 3.  She is slow to respond  to questions.  She has diminished attention, concentration and __________.  Her recall is also poor.  There is no aphasia or __________ dysarthria.  Pupils are equal and reactive to light and accommodation.  Face is  symmetric, bilateral.  Movements are normal.  Tongue is midline.  MOTOR:  Exam reveals symmetric upper and lower extremity strength, tone,  reflexes, coordination, sensation.  She walks with a slow steady gait and  has only minimal difficulty with tandem walking.   DATA REVIEWED:  Patient's past neurologic office notes from Dr. Anne Hahn and  Latrelle Dodrill, NP were reviewed.  EEG done on 11/30/03 was normal.  MRI  scan was obtained and on 11/11/03 was also normal.  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  The patient has clearly expressed a desire to take once a day  seizure medications as she keeps forgetting the second dose of her  medicines.  She may benefit by switching to Dilantin which can be given once  a day.  However, given her young age she may be at risk for long-term  complications like osteopenia and osteoporosis and may need supplement  calcium and vitamin D with the Dilantin.  I have also discussed  teratogenicity of Dilantin with the patient, but she states she has already  had three kids and does not want to have more.  I have advised her to use  contraception precautions if she is sexually active.  I recommended reducing  the Lamictal to 100 mg at night for a week and then stopping it.  Give  Dilantin 400 mg  loading dose now as well as a second dose in 8 hours.  Start  Dilantin 300 mg once a day starting tomorrow.  Check Dilantin level in 1  weeks time and aim for a level between 10 and 20 mg%.  The Neurontin I  suspect is for behavioral reasons; if she needs it we can continue.  If it  is for seizures I would recommend tapering the Neurontin as well as it not  the most effective medication for generalized seizures.  She may follow up  electively with Dr. Anne Hahn in the office as necessary.      PPS/MEDQ  D:  02/28/2005  T:  03/01/2005  Job:  [id number]

## 2011-04-26 NOTE — H&P (Signed)
Brandi Gomez, BARCELO NO.:  [id number]   MEDICAL RECORD NO.:  [id number]          PATIENT TYPE:  IPS   LOCATION:  0406                          FACILITY:  [facility]   PHYSICIAN:  Geoffery Lyons, M.D.      DATE OF BIRTH:  April 05, 1971   DATE OF ADMISSION:  03/22/2005  DATE OF DISCHARGE:                         PSYCHIATRIC ADMISSION ASSESSMENT   IDENTIFYING INFORMATION:  This is a 40 year old single African-American  female voluntarily admitted.  Basically she is representing today exactly as  she did at the time of her first admission on March 21.  Apparently her  boyfriend took her to mental health.  She as complaining of suicidal  ideation with a plan to overdose on her medication because of depression.  She is depressed about her finances.  She states that she was working until  about age 10 or 57 when she was found sleeping and tested positive for  drugs.  She is currently on probations.  She has 3 more years of probation.  She has applied for disability.  She is frantic that she cannot provide for  her children.  Her 66 year old daughter has a 40-month-old daughter.  Her 37-  year-old daughter and 67-year-old daughter are all within the home.  These  children all have different fathers.  The father of the 67-year-old is her  current boyfriend.  Apparently he helps take care of the children and pays  the bills etc.  However, the patient is obsessive about maintaining her  independence and getting her own income.   PAST PSYCHIATRIC HISTORY:  As already stated, she was recently admitted on  February 26, 2005, I am not sure of her discharge date.   SOCIAL HISTORY:  She has never married.  She has the children as already  indicated.  Apparently she had mild MR.  Her IQ is 67.   FAMILY HISTORY:  States her mother was bipolar.   ALCOHOL AND DRUG HISTORY:  She denies any recent use.   MEDICAL HISTORY/PRIMARY CARE Areliz Rothman:  She is followed by Dr. Para March at  [facility].  She is seen by Dr. Anne Hahn in neurology for  seizures.  She states that seizures began approximately 1-2 years ago, and  she states it is related to her sleep apnea.   MEDICATIONS:  1.  Lorazepam 1 mg at h.s.  2.  Celexa 20 mg p.o. q.a.m.  3.  Aricept 10 mg p.o. at h.s.  4.  Dilantin 300 mg at h.s.  5.  Neurontin 600 mg t.i.d.   MENTAL STATUS EXAM:  Today she was alert and oriented.  She was  appropriately groomed and dressed, albeit she was in bed.  She had good eye  contact.  Her speech was not pressured.  Her mood was appropriate to the  situation.  Her affect was congruent.  Her thought processes were clear,  rational and goal-oriented.  She wants the decision made about her financial  assistance, her Social Security Disability.  Her judgment and insight are  fair.  Concentration and memory are intact.  Intelligence is average to  below.  Apparently she has a documented IQ of 77.  She denies substance  abuse.  Today she is not suicidal or homicidal.  She denies audio or visual  hallucinations.  She states that she does not know what she is doing.  She  knows that when she comes in here she will calm down.  The real problem is  to get the judge to make some decision about her financial income.  Her labs  are not available yet.  Otherwise, she is obese.  She has her CPAP machine  in there.  She has a seizure disorder, but she has not had an active seizure  since she has been in here.   ADMISSION DIAGNOSES:   AXIS I:  1.  Major depressive disorder, recurrent, severe.  2.  Mild mental retardation, IQ 68.   AXIS II:  Deferred.   AXIS III:  1.  Sleep apnea.  2.  Obesity.  3.  Epilepsy.   AXIS IV:  Severe economic issues; also problems related to legal systems,  she is still on probation.   AXIS V:  30.   PLAN:  The plan is to stabilize and provide safety, adjust her medications  if indicated, and we will have the social worker try to ascertain where her  claim  is for disability.      MD/MEDQ  D:  03/23/2005  T:  03/23/2005  Job:  [id number]

## 2011-04-26 NOTE — Consult Note (Signed)
Brandi Gomez, Brandi Gomez            ACCOUNT NO.:  [id number]   MEDICAL RECORD NO.:  [id number]          PATIENT TYPE:  IPS   LOCATION:  0403                          FACILITY:  [facility]   PHYSICIAN:  Genene Churn. Love, M.D.    DATE OF BIRTH:  06/15/1971   DATE OF CONSULTATION:  07/04/2005  DATE OF DISCHARGE:                                   CONSULTATION   PATIENT ADDRESS:  [address] [place], [place], [place] [address].   IDENTIFYING INFORMATION:  This 40 year old right-handed, black, single  female was seen at the request of Dr. Jeanice Lim for evaluation of  major motor seizures and possible medication adjustments.   HISTORY OF PRESENT ILLNESS:  Brandi Gomez has a 1-1/2 year history of major  motor seizures that have all been nocturnal.  She indicates she has probably  had about four of these seizures during the year and a half, though I  suspect there have been more.  Her last seizure occurred on Sunday, June 22, 2005.  She has no warning of seizures.  Denies macropsia, micropsia, deja  vu, strange odors or tastes.  She has been followed by Dr. Lesia Sago of  [facility] and had an MRI study of the brain and an EEG  in December of 2004, both of which were unremarkable.  She has been on  Dilantin 200 mg q.h.s. and has been on Neurontin 600 mg t.i.d. but using  that primarily as a mood stabilizer.  In the past, I note she has been on  Lamictal also as a mood stabilizer.  She has had a one-year history of  memory loss, which she says began prior to the use of Dilantin medication.  She states that her memory loss is causing her to be very depressed.  She is  unable to work and, because of this, has had an aggravation of depression.  She has been admitted to [facility] in the past  and on this occasion, July 03, 2005, for suicidal ideation.  She has not had  any recent headaches, double vision, swallowing problems, slurred speech,  blackout spells or seizures.   PAST MEDICAL HISTORY:  Obesity, hypertension, depression, sleep apnea,  memory loss and nocturnal seizures.   MEDICATIONS:  1.  Phenytoin 200 mg q.h.s.  2.  Neurontin 600 mg t.i.d.  3.  Hydrochlorothiazide 25 mg q.d.  4.  Aricept 5 mg q.d.   PHYSICAL EXAMINATION:  This is a well-developed, obese, black female with  blood pressure in right arm 120/80, left arm 110/80, heart rate 64 and  regular.  There were no bruits.  Mental status:  She was alert and oriented  x 3.  She scored 29/30 on MMSE.  There was no evidence of an aphasia.  Cranial nerve examination full visual fields full, disks flat, extraocular  movements full, corneales present.  No seventh nerve palsy.  Tongue midline.  Uvula midline.  Gag is present.  Sternocleidomastoid and trapezius testing  normal.  Motor 5/5 strength.  Upper extremities and lower extremities  coordination testing was normal.  Sensory examination  was intact to  pinprick, touch, _________ position and vibration testing.  Deep tendon  reflexes were 1+ and plantar responses were downgoing.  She had decreased  right nasolabial fold.   IMPRESSION:  1.  Nocturnal seizures, code 3.5.10, most likely representing epilepsy but      this is adult onset and repeat MI study of the brain may be indicated.  2.  Depression, code 311.  3.  Poor medicine compliance.  4.  Memory loss secondary to depression, code 780.93.  5.  Sleep apnea, code 780.57.   PLAN:  At this time get a phenytoin level and consider repeating her MRI  study of the brain.  Adjustments in her medications will probably have been  accomplished by her taking her Neurontin now 600 mg t.i.d. which is on a  regular basis for her.       JML/MEDQ  D:  07/04/2005  T:  07/05/2005  Job:  [id number]

## 2011-04-26 NOTE — H&P (Signed)
Brandi Gomez, Brandi Gomez NO.:  [id number]   MEDICAL RECORD NO.:  [id number]          PATIENT TYPE:  IPS   LOCATION:  0505                          FACILITY:  [facility]   PHYSICIAN:  Geoffery Lyons, M.D.      DATE OF BIRTH:  1971/07/24   DATE OF ADMISSION:  02/26/2005  DATE OF DISCHARGE:                         PSYCHIATRIC ADMISSION ASSESSMENT   IDENTIFYING INFORMATION:  This is a 40 year old African-American female who  is single.  This is an involuntary admission.   HISTORY OF PRESENT ILLNESS:  This patient was referred by mental health  after presenting there complaining of suicidal thoughts with depressed mood  and a plan to overdose on her medications.  She endorses having depressed  mood for approximately 2-4 weeks and gradually getting worse over the past  three years.  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  She  is caring for three children and her 66 year old daughter has a 65-month-old  child in the home.  The patient has no means of financial support and is  reliant on her boyfriend to pay her bills.  She is waiting decision about  social security income based on her disability.  She receives food stamps  and is currently on probation because she admitted to stealing some goods  that had been pawned, although she said she, herself, did not steal them.  She decided to take responsibility for it instead of having her friend be  charged.  She endorses frequent crying episodes, anhedonia, depressed mood.  Has begun having some nightmares and, the night prior to admission, had  actually had a dream that she was killing herself and awoke crying in the  middle of the night.  Did not want to tell her family why she was crying.  Sleep is decreased down to five hours per night with some initial insomnia  and broken sleep throughout the night.  Denies any auditory or visual  hallucinations.  The patient is noted a history of short-term memory loss  related to her sleep apnea and grand mal seizures.  Last seizure two days  ago because she has been unable to remember to take her medications.  She  endorses suicidal ideation today but is able to contract for safety on the  unit.   PAST PSYCHIATRIC HISTORY:  The patient has been followed by Dr. Ezzard Flax  at [facility] since 2004 and is also followed by Tamala Fothergill, her psychologist, who she sees weekly.  This is her first inpatient  psychiatric admission.  In the past, she has been treated with Wellbutrin,  which was stopped when she was diagnosed with seizures approximately a year  ago.  Also had taken Paxil in the distant past but does not remember her  response or why it was stopped.  The patient denies any history of panic  attacks.  She does endorse some history of mood lability with increased  energy at times during which time she finds that she is constantly busy  around the  house, restless with poor sleep.  No clear history of mania.   FAMILY HISTORY:  Mother with a history of bipolar disorder.   ALCOHOL/DRUG HISTORY:  The patient denies any current or past history of  substance abuse.  No use of tobacco.   PRIMARY CARE PHYSICIAN:  The patient is followed by Dr. Anastasio Auerbach at  [facility], by Dr. Lesia Sago at [facility], she was last seen there six months ago, and by Dr.  Suzanna Obey, who is her ENT physician for her sleep apnea.   MEDICAL PROBLEMS:  Seizure disorder with her last seizure being a grand mal  seizure approximately two nights ago.  She attributes this to medication  noncompliance since she finds herself forgetting doses frequently.  Obesity  and sleep apnea.  She has past medical history for nasal surgery by her ENT  for her sleep apnea.   MEDICATIONS:  Lamictal 200 mg p.o. q.h.s., Aricept 10 mg prescribed by Dr.  Mila Homer  and Dr. Mila Homer also prescribed the Lamictal, and Neurontin 600 mg t.i.d.  prescribed by Dr. Lesia Sago.  The patient also uses a C-PAP and O2 at 3  liters per minutes q.h.s. supplied by [employer].   ALLERGIES:  None.   POSITIVE PHYSICAL FINDINGS:  GENERAL:  This is a well-nourished, well-  developed, obese, African-American female who is in no acute distress.  Her  hair is shaved close to her head.  Affect is bright with some tearfulness.  She is generally cooperative.  VITALS:  Temperature 98.5, pulse 68, respirations 24, blood pressure 130/88.  Her pulse oximetry on admission was 100%.  She is 5 feet tall and 272  pounds.  HEAD:  Normocephalic.  EENT:  PERRL.  Sclerae nonicteric.  Extraocular movements are normal.  NECK:  Supple.  No thyromegaly.  No carotid bruits.  CHEST:  Clear to auscultation.  BREASTS:  Exam deferred.  CARDIOVASCULAR:  S1 and S2 are heard.  No clicks, murmurs or gallops.  Apical pulse synchronous with radial pulse.  ABDOMEN:  Rounded, soft, nontender, nondistended.  GENITOURINARY:  Deferred.  EXTREMITIES:  Pink, warm.  No pedal edema at this time.  SKIN:  Intact.  No rashes.  No tattoos.  No signs of self-mutilation.  No  remarkable scarring.  NEUROLOGIC:  Cranial nerves 2-12 are intact.  Extraocular movements within  normal limits.  Grip strength equal bilaterally.  Gait is normal with normal  arm swing.  Neurological is nonfocal.   SOCIAL HISTORY:  This is a single female, never married.  Has a supportive  boyfriend.  Three children, ages 27, 24 and 61.  She receives SSI check for  the 81-year-old who has some developmental delay.  She is currently helped  financially by her boyfriend, who is supportive and is caring for the  children while she is hospitalized.  Her disability is pending.  She also  receives food stamps.  She is currently probation for charges as noted  above.  LABORATORY DATA:  CBC with white count 6000, hemoglobin 10.8, hematocrit  32.3,  platelets normal at 292,000.  CMET reveals sodium 135, potassium 3.5,  chloride 99, CO2 29, BUN 7, creatinine 0.9, glucose with very slight  elevation at 123.  The patient's liver enzymes are all within normal limits.  SGOT 19, SGPT 18.  TSH is currently pending.  Her urinalysis was within  normal limits.  Urine drug screen pending and UPT pending.   REVIEW OF SYSTEMS:  The patient reports no fever or chills.  Appetite is  poor but no change in weight.  Her sleep decreased at five hours per night  for the past 2-3 weeks.   MENTAL STATUS EXAM:  This is a well-nourished, well-developed female.  Fully  alert, pleasant and cooperative.  Constricted affect.  Some tearfulness as  the interview progresses.  She is obviously grieving the death of her  mother, who was her most supportive person.  Mother died approximately six  years ago.  The patient feels very inadequate and is also quite fearful  about her memory loss and she had accidentally left the baby in the car and  forgotten it was there one day this past fall.  The patient's manner is  cooperative and appropriate.  Speech is normal in pace, tone and amount.  She is articulate.  Mood depressed, hopeless.  Thought process positive for  suicidal thoughts with a plan to overdose on medications.  She has been  having recurrent suicidal thoughts daily for the past week or so, unable to  get this idea out of her head.  Finds herself planning which medications she  would take.  Has no prior history of suicide attempt.  No homicidal  thoughts.  No hallucinations.  No flight of ideas.  No ideas of reference.  No evidence of psychosis.  Cognitively, she is intact and oriented x 3.  Intelligence above average.  Insight adequate.  Impulse control and judgment  within normal limits.  She does have some obvious short-term memory loss.  She is able to remember one of three things at three minutes and also has  some difficulty remembering some relevant  facts of just her roommate's names  here at the hospital, is unable to remember my name after careful  registration.  By history, she has had some significant problems remembering  to take medications.   DIAGNOSES:   AXIS I:  Bipolar disorder, type 2, depressed.   AXIS II:  Deferred.   AXIS III:  1.  Arthritis.  2.  Seizure disorder.   AXIS IV:  Severe (financial stress and lack of adequate primary supports).   AXIS V:  Current 25-35; past year 60-65, estimated.   PLAN:  To voluntarily admit the patient with 15-minute checks in place.  We  will check a UPT and a UA, which is currently pending.  We are going to ask  the casemanager to follow up on patient's living situation and if we have  any resources that we can offer her.  We are going to consider starting her  on Celexa 10 mg daily and will get in touch with Lesia Sago to coordinate her care and to get some input on possible alternatives to the Neurontin  dosing.  Will place the patient on seizure precautions and she is going to  have her own CPAP machine brought in from her home by her brother.   ESTIMATED LENGTH OF STAY:  Five days.      MAS/MEDQ  D:  02/27/2005  T:  02/27/2005  Job:  [id number]

## 2011-04-26 NOTE — Op Note (Signed)
NAME:  Brandi Gomez, Brandi Gomez                      ACCOUNT NO.:  [id number]   MEDICAL RECORD NO.:  [id number]                   PATIENT TYPE:  OIB   LOCATION:  NA                                   FACILITY:  [facility]   PHYSICIAN:  Suzanna Obey, M.D.                    DATE OF BIRTH:  1971-08-23   DATE OF PROCEDURE:  01/18/2004  DATE OF DISCHARGE:                                 OPERATIVE REPORT   PREOPERATIVE DIAGNOSES:  1. Deviated septum.  2. Turbinate hypertrophy.   POSTOPERATIVE DIAGNOSES:  1. Deviated septum.  2. Turbinate hypertrophy.  3. Adenoid hypertrophy.   PROCEDURE:  Septoplasty, submucous resection of inferior turbinates, and  adenoidectomy.   SURGEON:  Suzanna Obey, M.D.   ANESTHESIA:  General endotracheal tube.   ESTIMATED BLOOD LOSS:  Approximately 20 mL.   INDICATIONS:  This is a 40 year old who has had significant nasal  obstruction and congestion along with sleep apnea problems.  She has nasal  obstruction issues that are enough that any treatment for nasal CPAP would  not be amenable.  She has failed medical therapy.  The patient was informed  of the risks and benefits of the procedure including bleeding, infection,  perforation of the septum, change in the external appearance of the nose,  chronic encrusting and drying, and risks of the anesthetic.  All questions  were answered and consent was obtained.   OPERATION:  The patient was taken to the operating room and placed in supine  position.  After adequate general endotracheal tube anesthesia, was placed  in a supine position and prepped and draped in the usual sterile manner.  The oxymetazoline pledgets were placed in the nose bilaterally and then the  septum and inferior turbinates were injected with 1% lidocaine with  1:100,000 epinephrine.  A right hemitransfixion incision was performed,  raising the mucoperichondrium and ostial flap.  The cartilage was divided  about 2 cm posterior to the caudal strut and  the deviated portion of the  cartilage was removed with the Therapist, nutritional.  The opposite flap was  elevated.  The Jansen-Middleton forceps were used to remove the deviated  portion of the bone.  A 4-mm osteotome was used to remove the inferior spur.  This corrected the septal deflection.  The hemitransfixion incision was  closed with interrupted 4-0 chromic and a 4-0 plain gut quilting stitch  placed at the septum.  The turbinates were in-fractured, midline incision  made with a 15 blade, mucosal flap elevated superiorly and the inferior  mucosa and bone were removed with the turbinate scissors.  The edges were  cauterized with suction cautery and both flaps were laid back down over the  raw surface and both turbinates were out-fractured.  Using the endoscope to  perform this procedure, the adenoid tissue could be seen as obstructing the  choana.  This was attempted to be removed with the suction cautery  through  the nose using the endoscope, but there was too much bleeding and difficulty  with exposure, so the Crowe-Davis was inserted and red rubber catheter was  then placed and the patient's nasopharynx was visualized with a mirror.  Using the suction cautery, the adenoid tissue was removed, opening up the  choana nicely.  There was good hemostasis and  the nasopharynx was irrigated with saline, expressing clear fluid.  The red  rubber catheter and Crowe-Davis were removed.  The Telfa rolls soaked in  Bacitracin were placed into the nose bilaterally and secured with a 3-0  nylon.  The patient was awakened and brought to the recovery room in stable  condition.  Counts correct.                                               Suzanna Obey, M.D.    Cordelia Pen  D:  01/18/2004  T:  01/18/2004  Job:  [id number]   cc:   [facility]

## 2011-04-26 NOTE — Discharge Summary (Signed)
NAMEAUSTRALIA, DROLL NO.:  [id number]   MEDICAL RECORD NO.:  [id number]          PATIENT TYPE:  IPS   LOCATION:  0403                          [facility]ILITY[facility]  [facility]   PHYSICIAN:  Jeanice Lim, M.D. DATE OF BIRTH:  06-Feb-1971   DATE OF ADMISSION:  07/03/2005  DATE OF DISCHARGE:  07/05/2005                                 DISCHARGE SUMMARY   IDENTIFYING DATA:  This is a 40 year old single African-American female  admitted on July 03, 2005.  Voluntary admission.  Presenting with  depression, frustrated, reported suicidal thoughts with plan to overdose.  Sometimes wants to live and other times does not see future.  Wants to be  able to work.  Feels hopeless at times.  Feels she cannot be completely  independent and what kind of life does she have, feeling sorry for herself.  Third admission, here in April.  She is __________ and at [facility].  No history of suicide attempts.  No report of acute  suicidal ideation at the time of admission.  The patient actually denied  being acutely suicidal and did not have a current plan.  Admitted to having  a history of a plan during assessment and therefore was admitted to err on  the side of safety.   MEDICATIONS:  Aricept, Neurontin, hydrochlorothiazide, phenytoin, Vistaril.   ALLERGIES:  No known drug allergies.   PHYSICAL EXAMINATION:  Physical and neurologic exam essentially within  normal limits.   MENTAL STATUS EXAM:  Alert, cooperative.  Good eye contact.  Morbidly obese.  Speech clear.  Mood frustrated, depressed.  No dangerous ideation.  No  suicidal or homicidal thoughts.  No psychotic symptoms.  Cognitively intact.  Judgment and insight were fair.   ADMISSION DIAGNOSES:  AXIS I:  Possible major depressive disorder,  recurrent, moderate versus adjustment disorder with mixed emotions.  AXIS II:  Borderline intellectual functioning.  AXIS III:  Epilepsy, hypertension, sleep apnea.  AXIS  IV:  Moderate (stressors with economic problems, psychosocial issues  and other medical problems).  AXIS V:  45/60.   HOSPITAL COURSE:  The patient was admitted and ordered routine p.r.n.  medications and underwent further monitoring.  Was encouraged to participate  in individual, group and milieu therapy.  The patient's family was contacted  and neuro consult obtained.  The patient was to follow up with [facility] and family felt that this was more important.  Did not  feel the patient presented a safety issue.  The patient denied any suicidal  or homicidal thoughts.   CONDITION ON DISCHARGE:  Discharged in slightly improved condition, denying  any dangerous ideation, no acute intent.  Mood was mostly euthymic.  It was  future-oriented with problem-solving and aftercare plan in place.  The  patient was discharged, again, with no risk issues.  Mood was euthymic.  Affect full.  Medication education given and patient was discharged to  continue medications as previously prescribed.  No medication changes.  No  prescriptions needed.   FOLLOW UP:  The patient was to follow up with Dr. __________ Tuesday, August  8th at 11 a.m. and to continue neurologic follow-up and at [facility] on Thursday, August 3rd at 1 p.m.   DISCHARGE DIAGNOSES:  AXIS I:  Possible major depressive disorder,  recurrent, moderate versus adjustment disorder with mixed emotions.  AXIS II:  Borderline intellectual functioning.  AXIS III:  Epilepsy, hypertension, sleep apnea.  AXIS IV:  Moderate (stressors with economic problems, psychosocial issues  and other medical problems).  AXIS V:  GAF on discharge 55-60.      Jeanice Lim, M.D.  Electronically Signed     JEM/MEDQ  D:  08/14/2005  T:  08/14/2005  Job:  [id number]

## 2011-05-28 ENCOUNTER — Emergency Department (HOSPITAL_COMMUNITY)
Admission: EM | Admit: 2011-05-28 | Discharge: 2011-05-28 | Disposition: A | Discharge status: Home / Self Care | Payer: Medicare Other | Attending: Emergency Medicine | Admitting: Emergency Medicine

## 2011-05-28 DIAGNOSIS — E669 Obesity, unspecified: Secondary | ICD-10-CM | POA: Insufficient documentation

## 2011-05-28 DIAGNOSIS — I10 Essential (primary) hypertension: Secondary | ICD-10-CM | POA: Insufficient documentation

## 2011-05-28 DIAGNOSIS — G4730 Sleep apnea, unspecified: Secondary | ICD-10-CM | POA: Insufficient documentation

## 2011-05-28 DIAGNOSIS — R569 Unspecified convulsions: Secondary | ICD-10-CM | POA: Insufficient documentation

## 2011-05-28 DIAGNOSIS — E119 Type 2 diabetes mellitus without complications: Secondary | ICD-10-CM | POA: Insufficient documentation

## 2011-05-28 DIAGNOSIS — R51 Headache: Secondary | ICD-10-CM | POA: Insufficient documentation

## 2011-05-28 DIAGNOSIS — F319 Bipolar disorder, unspecified: Secondary | ICD-10-CM | POA: Insufficient documentation

## 2011-06-16 ENCOUNTER — Emergency Department (HOSPITAL_COMMUNITY)
Admission: EM | Admit: 2011-06-16 | Discharge: 2011-06-17 | Disposition: A | Discharge status: Home / Self Care | Payer: Medicare Other | Attending: Emergency Medicine | Admitting: Emergency Medicine

## 2011-06-16 DIAGNOSIS — E119 Type 2 diabetes mellitus without complications: Secondary | ICD-10-CM | POA: Insufficient documentation

## 2011-06-16 DIAGNOSIS — G43909 Migraine, unspecified, not intractable, without status migrainosus: Secondary | ICD-10-CM | POA: Insufficient documentation

## 2011-06-16 DIAGNOSIS — F319 Bipolar disorder, unspecified: Secondary | ICD-10-CM | POA: Insufficient documentation

## 2011-06-16 DIAGNOSIS — G40909 Epilepsy, unspecified, not intractable, without status epilepticus: Secondary | ICD-10-CM | POA: Insufficient documentation

## 2011-06-16 LAB — POCT I-STAT, CHEM 8
BUN: 9 mg/dL (ref 6–23)
Calcium, Ion: 1.15 mmol/L (ref 1.12–1.32)
Glucose, Bld: 150 mg/dL — ABNORMAL HIGH (ref 70–99)
TCO2: 27 mmol/L (ref 0–100)

## 2011-06-16 LAB — PHENYTOIN LEVEL, TOTAL: Phenytoin Lvl: 8.1 ug/mL — ABNORMAL LOW (ref 10.0–20.0)

## 2011-09-05 NOTE — Progress Notes (Signed)
Addended by: Maple Hudson on: 09/05/2011 06:43 PM   Modules accepted: Orders

## 2011-09-17 ENCOUNTER — Emergency Department (HOSPITAL_COMMUNITY)
Admission: EM | Admit: 2011-09-17 | Discharge: 2011-09-17 | Disposition: A | Discharge status: Home / Self Care | Payer: Medicare Other | Attending: Emergency Medicine | Admitting: Emergency Medicine

## 2011-09-17 DIAGNOSIS — E119 Type 2 diabetes mellitus without complications: Secondary | ICD-10-CM | POA: Insufficient documentation

## 2011-09-17 DIAGNOSIS — G40909 Epilepsy, unspecified, not intractable, without status epilepticus: Secondary | ICD-10-CM | POA: Insufficient documentation

## 2011-09-17 LAB — BASIC METABOLIC PANEL
BUN: 13 mg/dL (ref 6–23)
CO2: 29 mEq/L (ref 19–32)
Calcium: 9 mg/dL (ref 8.4–10.5)
Creatinine, Ser: 0.72 mg/dL (ref 0.50–1.10)

## 2011-09-17 LAB — PHENYTOIN LEVEL, TOTAL: Phenytoin Lvl: 3 ug/mL — ABNORMAL LOW (ref 10.0–20.0)

## 2011-11-24 ENCOUNTER — Encounter (HOSPITAL_COMMUNITY): Payer: Self-pay | Admitting: *Deleted

## 2011-11-24 ENCOUNTER — Emergency Department (HOSPITAL_COMMUNITY)
Admission: EM | Admit: 2011-11-24 | Discharge: 2011-11-24 | Disposition: A | Discharge status: Home / Self Care | Payer: Medicare Other | Source: Home / Self Care | Attending: Family Medicine | Admitting: Family Medicine

## 2011-11-24 DIAGNOSIS — R05 Cough: Secondary | ICD-10-CM

## 2011-11-24 DIAGNOSIS — J40 Bronchitis, not specified as acute or chronic: Secondary | ICD-10-CM

## 2011-11-24 DIAGNOSIS — R0602 Shortness of breath: Secondary | ICD-10-CM

## 2011-11-24 MED ORDER — ALBUTEROL SULFATE HFA 108 (90 BASE) MCG/ACT IN AERS
1.0000 | INHALATION_SPRAY | Freq: Four times a day (QID) | RESPIRATORY_TRACT | Status: DC | PRN
Start: 2011-11-24 — End: 2012-06-22

## 2011-11-24 MED ORDER — ALBUTEROL SULFATE (5 MG/ML) 0.5% IN NEBU
INHALATION_SOLUTION | RESPIRATORY_TRACT | Status: AC
  Filled 2011-11-24: qty 1

## 2011-11-24 MED ORDER — AZITHROMYCIN 250 MG PO TABS: 250.0000 mg | ORAL_TABLET | Freq: Every day | ORAL | Status: AC

## 2011-11-24 NOTE — ED Provider Notes (Signed)
History     CSN: [id number] Arrival date & time: 11/24/2011  6:11 PM   First MD Initiated Contact with Patient 11/24/11 1858      No chief complaint on file.   (Consider location/radiation/quality/duration/timing/severity/associated sxs/prior treatment) Patient is a 40 y.o. female presenting with cough. The history is provided by the patient. No language interpreter was used.  Cough This is a new problem. The current episode started more than 2 days ago. The problem occurs constantly. The problem has been gradually worsening. The cough is non-productive. There has been no fever. The fever has been present for 1 to 2 days. Associated symptoms include sore throat and shortness of breath. Pertinent negatives include no chest pain. She has tried decongestants and cough syrup for the symptoms. The treatment provided no relief. She is not a smoker. Her past medical history is significant for bronchitis. Her past medical history does not include pneumonia.  Pt has been heating her house with her oven.  Pt reports she thinks fumes have made her short of breath.  Past Medical History  Diagnosis Date  . Type II or unspecified type diabetes mellitus without mention of complication, not stated as uncontrolled   . Hypertension   . Seizures   . Sleep apnea   . Anxiety     Past Surgical History  Procedure Date  . Nasal sinus surgery     Family History  Problem Relation Age of Onset  . Diabetes Mother     History  Substance Use Topics  . Smoking status: Never Smoker   . Smokeless tobacco: Never Used  . Alcohol Use: No    OB History    Grav Para Term Preterm Abortions TAB SAB Ect Mult Living                  Review of Systems  HENT: Positive for sore throat.   Respiratory: Positive for cough and shortness of breath.   Cardiovascular: Negative for chest pain.  All other systems reviewed and are negative.    Allergies  Hydrocodone  Home Medications   Current Outpatient Rx    Name Route Sig Dispense Refill  . CLONAZEPAM 0.5 MG PO TABS Oral Take 0.5 mg by mouth as needed.      Marland Kitchen ESCITALOPRAM OXALATE 20 MG PO TABS Oral Take 20 mg by mouth at bedtime.      . FUROSEMIDE 20 MG PO TABS Oral Take 20 mg by mouth daily.      Marland Kitchen METFORMIN HCL 500 MG PO TABS Oral Take 500 mg by mouth 2 (two) times daily with a meal.     . THERAFLU COLD & COUGH PO Oral Take by mouth.      Marland Kitchen PHENYTOIN 50 MG PO CHEW Oral Chew 50 mg by mouth at bedtime.      Marland Kitchen PHENYTOIN SODIUM EXTENDED 100 MG PO CAPS Oral Take 100 mg by mouth 2 (two) times daily.      Marland Kitchen PHENYTOIN SODIUM EXTENDED 100 MG PO CAPS Oral Take 100 mg by mouth 2 (two) times daily. Take 2 capsules     . SITAGLIPTIN PHOSPHATE 50 MG PO TABS Oral Take 50 mg by mouth daily.      Marland Kitchen DICYCLOMINE HCL 10 MG PO CAPS Oral Take 1 capsule (10 mg total) by mouth 4 (four) times daily -  before meals and at bedtime. 120 capsule 2  . FERROUS SULFATE 325 (65 FE) MG PO TABS Oral Take 1 tablet (325 mg total)  by mouth 2 (two) times daily. 60 tablet 3  . FOLIC ACID 1 MG PO TABS Oral Take 1 tablet (1 mg total) by mouth daily. 30 tablet 3  . HYDROCHLOROTHIAZIDE 12.5 MG PO CAPS Oral Take 12.5 mg by mouth daily.      Marland Kitchen NAPROXEN 500 MG PO TABS Oral Take 500 mg by mouth 2 (two) times daily with meals.        BP 131/85  Pulse 86  Temp(Src) 97.5 F (36.4 C) (Oral)  Resp 22  SpO2 100%  LMP 11/22/2011  Physical Exam  Nursing note and vitals reviewed. Constitutional: She is oriented to person, place, and time. She appears well-developed and well-nourished.  HENT:  Head: Normocephalic and atraumatic.  Right Ear: External ear normal.  Left Ear: External ear normal.  Nose: Nose normal.  Mouth/Throat: Oropharynx is clear and moist.  Eyes: Conjunctivae and EOM are normal. Pupils are equal, round, and reactive to light.  Neck: Normal range of motion. Neck supple.  Cardiovascular: Normal rate and normal heart sounds.   Pulmonary/Chest: Effort normal.  Abdominal:  Soft. Bowel sounds are normal.  Musculoskeletal: Normal range of motion.  Neurological: She is alert and oriented to person, place, and time. She has normal reflexes.  Skin: Skin is warm.  Psychiatric: She has a normal mood and affect.    ED Course  Procedures (including critical care time)  Labs Reviewed - No data to display No results found.   No diagnosis found.    MDM     Pt given albuterol neb.   I will treat with zithromax and albuterol     Langston Masker, [place] 11/24/11 1931

## 2011-11-24 NOTE — ED Provider Notes (Signed)
Medical screening examination/treatment/procedure(s) were performed by non-physician practitioner and as supervising physician I was immediately available for consultation/collaboration.   Barkley Bruns MD.    Barkley Bruns, MD 11/24/11 2010

## 2011-11-24 NOTE — ED Notes (Signed)
Pt with cough/congestion/increased sob - productive cough - denies fever

## 2011-12-26 ENCOUNTER — Ambulatory Visit: Payer: Self-pay | Admitting: Family Medicine

## 2012-01-22 ENCOUNTER — Other Ambulatory Visit: Payer: Self-pay | Admitting: Family Medicine

## 2012-02-06 ENCOUNTER — Other Ambulatory Visit: Payer: Self-pay | Admitting: Family Medicine

## 2012-03-12 DIAGNOSIS — F331 Major depressive disorder, recurrent, moderate: Secondary | ICD-10-CM | POA: Diagnosis not present

## 2012-03-17 ENCOUNTER — Other Ambulatory Visit: Payer: Self-pay | Admitting: Physician Assistant

## 2012-04-05 ENCOUNTER — Other Ambulatory Visit: Payer: Self-pay | Admitting: Physician Assistant

## 2012-04-28 ENCOUNTER — Other Ambulatory Visit: Payer: Self-pay | Admitting: Physician Assistant

## 2012-05-21 ENCOUNTER — Encounter (HOSPITAL_COMMUNITY): Payer: Self-pay | Admitting: Emergency Medicine

## 2012-05-21 ENCOUNTER — Emergency Department (INDEPENDENT_AMBULATORY_CARE_PROVIDER_SITE_OTHER)
Admission: EM | Admit: 2012-05-21 | Discharge: 2012-05-21 | Disposition: A | Discharge status: Home / Self Care | Payer: Medicare Other | Source: Home / Self Care | Attending: Emergency Medicine | Admitting: Emergency Medicine

## 2012-05-21 ENCOUNTER — Telehealth: Payer: Self-pay

## 2012-05-21 DIAGNOSIS — N390 Urinary tract infection, site not specified: Secondary | ICD-10-CM

## 2012-05-21 DIAGNOSIS — E119 Type 2 diabetes mellitus without complications: Secondary | ICD-10-CM | POA: Diagnosis not present

## 2012-05-21 DIAGNOSIS — R609 Edema, unspecified: Secondary | ICD-10-CM | POA: Diagnosis not present

## 2012-05-21 DIAGNOSIS — Z760 Encounter for issue of repeat prescription: Secondary | ICD-10-CM

## 2012-05-21 LAB — POCT URINALYSIS DIP (DEVICE)
Ketones, ur: NEGATIVE mg/dL
Protein, ur: 30 mg/dL — AB
Urobilinogen, UA: 2 mg/dL — ABNORMAL HIGH (ref 0.0–1.0)

## 2012-05-21 LAB — CBC
MCH: 30 pg (ref 26.0–34.0)
MCHC: 32.6 g/dL (ref 30.0–36.0)
MCV: 92 fL (ref 78.0–100.0)
Platelets: 244 10*3/uL (ref 150–400)
RDW: 13.6 % (ref 11.5–15.5)

## 2012-05-21 LAB — DIFFERENTIAL
Basophils Absolute: 0 10*3/uL (ref 0.0–0.1)
Eosinophils Absolute: 0.2 10*3/uL (ref 0.0–0.7)
Eosinophils Relative: 2 % (ref 0–5)

## 2012-05-21 LAB — POCT I-STAT, CHEM 8
Hemoglobin: 12.9 g/dL (ref 12.0–15.0)
Sodium: 141 mEq/L (ref 135–145)
TCO2: 30 mmol/L (ref 0–100)

## 2012-05-21 LAB — PHENYTOIN LEVEL, TOTAL: Phenytoin Lvl: 5.2 ug/mL — ABNORMAL LOW (ref 10.0–20.0)

## 2012-05-21 MED ORDER — NITROFURANTOIN MONOHYD MACRO 100 MG PO CAPS: 100.0000 mg | ORAL_CAPSULE | Freq: Two times a day (BID) | ORAL | Status: AC

## 2012-05-21 MED ORDER — HYDROCHLOROTHIAZIDE 12.5 MG PO CAPS: 12.5000 mg | ORAL_CAPSULE | Freq: Every day | ORAL | Status: DC

## 2012-05-21 NOTE — Telephone Encounter (Signed)
Pt calling to ask if she could get refills on her diabetic medication and on her fluid pills she has an appt next month with [place]

## 2012-05-21 NOTE — ED Notes (Signed)
Multiple complaints.  Reports ankle swelling for 2 weeks, associated with numbness to lower legs.  Also reports frequent urination and odor to urine.  Reports pressure with urination.  Last bm 2 days ago, reports feeling constipation

## 2012-05-21 NOTE — Telephone Encounter (Signed)
Patient down to see Dr. Elbert Ewings on 7/11, can we refill Metformin, Januvia and Lasix?  Looks like last labs were 03/2011.Marland KitchenMarland Kitchen

## 2012-05-21 NOTE — Discharge Instructions (Signed)
Check your blood sugar twice a day: Once in the morning before eating, and one hour after eating a meal. Write this down and take this record to Dr. Faustino Congress. Your blood sugar should be somewhere between 70-200. Your blood sugar was acceptable today, so I am not restarting the metformin or the Januvia  (your diabetic medications). Start the hydrochlorothiazide, this will help with both your blood pressure and with the swelling in your legs. Elevate your legs is much as possible. You  may wear support hose to help prevent swelling. Put these on in the morning when the swelling is at its least. I also sent off some other blood work, including a Dilantin level, to make sure that it is appropriate. Your primary care physician will call you if you need any medication changes. Make sure you finish all of the antibiotics, even if you're feeling better. Drink lots of extra, non sugary fluids.Return if you've a fever above 100.4, if you get worse, or for other concerns.

## 2012-05-21 NOTE — ED Provider Notes (Signed)
History     CSN: [id number]  Arrival date & time 05/21/12  1525   First MD Initiated Contact with Patient 05/21/12 1614      Chief Complaint  Patient presents with  . Joint Swelling    (Consider location/radiation/quality/duration/timing/severity/associated sxs/prior treatment) HPI Comments: Patient presents with multiple complaints. First, patient reports 2 weeks of bilateral lower extremity swelling, worse with standing for prolonged periods of time and at the end of the day, and better with elevation. She states that she is on a "fluid pill" but cannot remember which one. She is has not taken it in over a month. She complains of worsening paresthesias on her bilateral feet and lower legs. No color changes, recent or remote history of trauma to the area. No redness. She is a long-term diabetic, and doesn't check her sugar on a regular basis. She has not checked her glucose over 2 weeks, although she states that she has enough glucose testing strips, and a working meter.. She states that she has not taken her metformin or Januvia in over a month, because she was told by the drugstore that she has to be seen by her primary care physician prior to getting refills. States she has an appointment with him on 7/11. She states that she is taking all of her other medications as written.  Second, she reports urinary urgency, frequency, oderous urine, lower abdominal pressure after urinating. Some nausea, no vomiting. Is having normal bowel movements. No other abdominal pain, fevers, abdominal distention. No hematuria, vaginal bleeding, vaginal discharge. No aggravating or alleviating factors. She's not tried anything for this. She states this feels similar to previous UTIs.  Per chart review, patient has not had a lab work done since 09/27/2011. Her last Dilantin level was low, at 3.0.   ROS as noted in HPI. All other ROS negative.   Patient is a 41 y.o. female presenting with leg pain and frequency.  The history is provided by medical records and the patient. No language interpreter was used.  Leg Pain  The incident occurred more than 1 week ago. The incident occurred at home. There was no injury mechanism. The pain is present in the right leg and left leg. The quality of the pain is described as aching, burning and tingling. The pain has been constant since onset. Associated symptoms include numbness and tingling. Pertinent negatives include no inability to bear weight, no loss of motion and no muscle weakness. She reports no foreign bodies present. Nothing aggravates the symptoms. She has tried elevation for the symptoms. The treatment provided moderate relief.  Urinary Frequency This is a new problem. The current episode started more than 1 week ago. Nothing aggravates the symptoms. Nothing relieves the symptoms. She has tried nothing for the symptoms. The treatment provided no relief.    Past Medical History  Diagnosis Date  . Type II or unspecified type diabetes mellitus without mention of complication, not stated as uncontrolled   . Hypertension   . Seizures   . Sleep apnea   . Anxiety     Past Surgical History  Procedure Date  . Nasal sinus surgery     Family History  Problem Relation Age of Onset  . Diabetes Mother     History  Substance Use Topics  . Smoking status: Never Smoker   . Smokeless tobacco: Never Used  . Alcohol Use: No    OB History    Grav Para Term Preterm Abortions TAB SAB Ect Mult Living  Review of Systems  Genitourinary: Positive for frequency.  Neurological: Positive for tingling and numbness.    Allergies  Hydrocodone  Home Medications   Current Outpatient Rx  Name Route Sig Dispense Refill  . ALBUTEROL SULFATE HFA 108 (90 BASE) MCG/ACT IN AERS Inhalation Inhale 1-2 puffs into the lungs every 6 (six) hours as needed for wheezing. 1 Inhaler 0  . CLONAZEPAM 0.5 MG PO TABS Oral Take 0.5 mg by mouth as needed.      Marland Kitchen  DICYCLOMINE HCL 10 MG PO CAPS Oral Take 1 capsule (10 mg total) by mouth 4 (four) times daily -  before meals and at bedtime. 120 capsule 2  . ESCITALOPRAM OXALATE 20 MG PO TABS Oral Take 20 mg by mouth at bedtime.      Marland Kitchen FERROUS SULFATE 325 (65 FE) MG PO TABS Oral Take 1 tablet (325 mg total) by mouth 2 (two) times daily. 60 tablet 3  . HYDROCHLOROTHIAZIDE 12.5 MG PO CAPS Oral Take 1 capsule (12.5 mg total) by mouth daily. 30 capsule 0  . NAPROXEN 500 MG PO TABS Oral Take 500 mg by mouth 2 (two) times daily with meals.      Marland Kitchen NITROFURANTOIN MONOHYD MACRO 100 MG PO CAPS Oral Take 1 capsule (100 mg total) by mouth 2 (two) times daily. 10 capsule 0  . PHENYTOIN 50 MG PO CHEW Oral Chew 50 mg by mouth at bedtime.      Marland Kitchen PHENYTOIN SODIUM EXTENDED 100 MG PO CAPS Oral Take 100 mg by mouth 2 (two) times daily.      Marland Kitchen PHENYTOIN SODIUM EXTENDED 100 MG PO CAPS Oral Take 100 mg by mouth 2 (two) times daily. Take 2 capsules       BP 160/100  Pulse 73  Temp 98.5 F (36.9 C) (Oral)  Resp 20  SpO2 98%  LMP 04/23/2012  Physical Exam  Nursing note and vitals reviewed. Constitutional: She is oriented to person, place, and time. She appears well-developed and well-nourished.  HENT:  Head: Normocephalic and atraumatic.  Eyes: Conjunctivae and EOM are normal.  Neck: Normal range of motion.  Cardiovascular: Normal rate, regular rhythm, normal heart sounds and intact distal pulses.   No murmur heard. Pulmonary/Chest: Effort normal and breath sounds normal.  Abdominal: Soft. Bowel sounds are normal. She exhibits no distension and no mass. There is no tenderness. There is no rebound, no guarding and no CVA tenderness.  Musculoskeletal: Normal range of motion. She exhibits edema. She exhibits no tenderness.       1+ pitting edema to midshin bilaterally. Skin intact. No signs of infection. Calves symmetric. DP 2+.  Neurological: She is alert and oriented to person, place, and time.  Skin: Skin is warm and dry.    Psychiatric: She has a normal mood and affect. Her behavior is normal. Judgment and thought content normal.    ED Course  Procedures (including critical care time)  Labs Reviewed  POCT URINALYSIS DIP (DEVICE) - Abnormal; Notable for the following:    Bilirubin Urine SMALL (*)     Hgb urine dipstick TRACE (*)     Protein, ur 30 (*)     Urobilinogen, UA 2.0 (*)     Leukocytes, UA SMALL (*)  Biochemical Testing Only. Please order routine urinalysis from main lab if confirmatory testing is needed.   All other components within normal limits  PHENYTOIN LEVEL, TOTAL - Abnormal; Notable for the following:    Phenytoin Lvl 5.2 (*)  All other components within normal limits  CBC - Abnormal; Notable for the following:    RBC 3.77 (*)     Hemoglobin 11.3 (*)     HCT 34.7 (*)     All other components within normal limits  POCT I-STAT, CHEM 8 - Abnormal; Notable for the following:    Glucose, Bld 170 (*)     All other components within normal limits  DIFFERENTIAL  HEMOGLOBIN A1C   No results found.   1. UTI (lower urinary tract infection)   2. Medication refill   3. Peripheral edema      MDM  records reviewed. Patient called PMD"s office today asking for refills of metformin, Januvia, fluid pill ( Lasix?)  Has an appointment next month on 7/11.  Udip noted. Clinically, she has a UTI. Home with Macrobid. Will also send off an i-STAT, if this is normal, will restart her on hydrochlorothiazide for blood pressure and peripheral edema.  Suspect that her paresthesias are from  diabetes. No signs of stroke. Also sending off Dilantin level, CBC, A1c as a courtesy as she's not had lab work in almost a year. Discussed with patient about normal glucose ranges, and will have her start checking her sugars twice a day, and keeping a log of this. She states she has enough supplies to check her sugars regularly.   Labs noted. Glucose acceptable. Since she's not had the Januvia or metformin for 2  months, and her glucose is fine today, we'll not restart her on this. Have her followup with her private care physician for the Dilantin level and hgb a1c. Discuss lab results, MDM with patient. She agrees with plan. Will cc Dr. Faustino Congress on today's visit.  Luiz Blare, MD 05/21/12 2046

## 2012-05-22 ENCOUNTER — Telehealth (HOSPITAL_COMMUNITY): Payer: Self-pay | Admitting: *Deleted

## 2012-05-22 MED ORDER — FUROSEMIDE 20 MG PO TABS: 20.0000 mg | ORAL_TABLET | Freq: Every day | ORAL | Status: DC

## 2012-05-22 MED ORDER — METFORMIN HCL 500 MG PO TABS: ORAL_TABLET | ORAL | Status: DC

## 2012-05-22 MED ORDER — SITAGLIPTIN PHOSPHATE 50 MG PO TABS: 50.0000 mg | ORAL_TABLET | Freq: Every day | ORAL | Status: DC

## 2012-05-22 NOTE — Telephone Encounter (Signed)
Prescriptions done.

## 2012-05-22 NOTE — ED Notes (Signed)
Pt. called back and verified x 2. Pt. given results and Dr. Andreas Blower instructions-( previously noted.) Brandi Gomez 05/22/2012

## 2012-05-22 NOTE — Telephone Encounter (Signed)
Spoke with patient, she was seen at the [facility] yesterday for leg swelling and a UTI.  Given Macrobid and HCTZ, and is feeling better--leg swelling slightly down.  Can we refill meds?

## 2012-05-22 NOTE — Telephone Encounter (Signed)
Patient notified

## 2012-05-22 NOTE — Telephone Encounter (Signed)
It looks like the patient is currently in the hospital. Please get details.

## 2012-05-22 NOTE — ED Notes (Signed)
Phenytoin 5/2 L, Hgb A1C 6.6 H.  Labs shown to Dr. Chaney Malling. She said to call pt. her results and tell her to talk to Dr. Milus Glazier about  adjusting her medication.  I called and left a message to call.

## 2012-06-15 ENCOUNTER — Other Ambulatory Visit: Payer: Self-pay | Admitting: Physician Assistant

## 2012-06-18 ENCOUNTER — Encounter: Payer: Self-pay | Admitting: Family Medicine

## 2012-06-18 ENCOUNTER — Ambulatory Visit (INDEPENDENT_AMBULATORY_CARE_PROVIDER_SITE_OTHER): Payer: Medicare Other | Admitting: Family Medicine

## 2012-06-18 VITALS — BP 133/82 | HR 77 | Temp 98.3°F | Resp 18 | Ht 60.5 in | Wt 295.0 lb

## 2012-06-18 DIAGNOSIS — R600 Localized edema: Secondary | ICD-10-CM

## 2012-06-18 DIAGNOSIS — E1169 Type 2 diabetes mellitus with other specified complication: Secondary | ICD-10-CM

## 2012-06-18 DIAGNOSIS — N390 Urinary tract infection, site not specified: Secondary | ICD-10-CM | POA: Diagnosis not present

## 2012-06-18 DIAGNOSIS — G5710 Meralgia paresthetica, unspecified lower limb: Secondary | ICD-10-CM | POA: Diagnosis not present

## 2012-06-18 DIAGNOSIS — E119 Type 2 diabetes mellitus without complications: Secondary | ICD-10-CM

## 2012-06-18 LAB — POCT URINALYSIS DIPSTICK
Bilirubin, UA: NEGATIVE
Glucose, UA: NEGATIVE
Ketones, UA: NEGATIVE
Nitrite, UA: NEGATIVE
Protein, UA: NEGATIVE
Spec Grav, UA: >=1.03
Urobilinogen, UA: 0.2
pH, UA: 5.5

## 2012-06-18 LAB — POCT UA - MICROSCOPIC ONLY
Casts, Ur, LPF, POC: NEGATIVE
Crystals, Ur, HPF, POC: NEGATIVE
Yeast, UA: NEGATIVE

## 2012-06-18 MED ORDER — SITAGLIPTIN PHOSPHATE 50 MG PO TABS: 50.0000 mg | ORAL_TABLET | Freq: Every day | ORAL | Status: DC

## 2012-06-18 MED ORDER — CIPROFLOXACIN HCL 250 MG PO TABS: 250.0000 mg | ORAL_TABLET | Freq: Two times a day (BID) | ORAL | Status: DC

## 2012-06-18 MED ORDER — FUROSEMIDE 40 MG PO TABS: 40.0000 mg | ORAL_TABLET | Freq: Every day | ORAL | Status: DC

## 2012-06-18 NOTE — Progress Notes (Signed)
@[email or web address] @  Patient ID: Brandi Gomez MRN: [id number], DOB: Aug 04, 1971, 41 y.o. Date of Encounter: 06/18/2012, 8:02 AM  Primary Physician: Elvina Sidle, MD  Chief Complaint: Diabetes follow up  HPI: 41 y.o. year old female with history below presents for follow up of diabetes mellitus. Doing well. No issues or complaints. Taking medications daily without adverse effects. No polydipsia, polyphagia, polyuria, or nocturia.  Patient has run out of her Januvia and doesn't really know which medicines she is taking. She notes that the Dilantin upsets her stomach.  She had a seizure several days ago.  Her Dilantin level was low, and she says she is compliant, but her neurologist had initially refused to refill until she was seen.  She now has a refill. She was recently evaluated at [facility] for a UTI and was treated with Macrobid for a week.  Patient states that she took all the antibiotic.  Hobbies: sewing patchwork, she likes the movies, watches grandchild at the park and at home.  Last A1C:  6.6 two weeks ago  Eye MD: it's been over 2 years, and she says she will make an appointment. DDS: Influenza vaccine: Pneumococcal vaccine: Last CPE:  Past Medical History  Diagnosis Date  . Type II or unspecified type diabetes mellitus without mention of complication, not stated as uncontrolled   . Hypertension   . Seizures   . Sleep apnea   . Anxiety      Home Meds: Prior to Admission medications   Medication Sig Start Date End Date Taking? Authorizing Provider  clonazePAM (KLONOPIN) 0.5 MG tablet Take 0.5 mg by mouth as needed.     Yes Historical Provider, MD  donepezil (ARICEPT) 10 MG tablet Take 10 mg by mouth at bedtime as needed.   Yes Historical Provider, MD  escitalopram (LEXAPRO) 20 MG tablet Take 20 mg by mouth at bedtime.     Yes Historical Provider, MD  metFORMIN (GLUCOPHAGE) 500 MG tablet TAKE 1 TABLET BY MOUTH TWICE A DAY WITH FOOD 06/15/12  Yes Pattricia Boss, PA-C    phenytoin (DILANTIN INFATABS) 50 MG tablet Chew 50 mg by mouth at bedtime.     Yes Historical Provider, MD  phenytoin (DILANTIN) 100 MG ER capsule Take 100 mg by mouth 2 (two) times daily.     Yes Historical Provider, MD  albuterol (PROVENTIL HFA;VENTOLIN HFA) 108 (90 BASE) MCG/ACT inhaler Inhale 1-2 puffs into the lungs every 6 (six) hours as needed for wheezing. 11/24/11 11/23/12  Elson Areas, PA  dicyclomine (BENTYL) 10 MG capsule Take 1 capsule (10 mg total) by mouth 4 (four) times daily -  before meals and at bedtime. 03/19/11 03/18/12  Mardella Layman, MD  ferrous sulfate 325 (65 FE) MG tablet Take 1 tablet (325 mg total) by mouth 2 (two) times daily. 03/06/11 03/05/12  Mardella Layman, MD  furosemide (LASIX) 20 MG tablet Take 1 tablet (20 mg total) by mouth daily. 05/22/12   Heather Jaquita Rector, PA-C  hydrochlorothiazide (MICROZIDE) 12.5 MG capsule Take 1 capsule (12.5 mg total) by mouth daily. 05/21/12   Luiz Blare, MD  naproxen (NAPROSYN) 500 MG tablet Take 500 mg by mouth 2 (two) times daily with meals.      Historical Provider, MD  phenytoin (DILANTIN) 100 MG ER capsule Take 100 mg by mouth 2 (two) times daily. Take 2 capsules     Historical Provider, MD  sitaGLIPtin (JANUVIA) 50 MG tablet Take 1 tablet (50 mg total) by mouth daily. 06/18/12  Elvina Sidle, MD    Allergies:  Allergies  Allergen Reactions  . Hydrocodone     History   Social History  . Marital Status: Single    Spouse Name: N/A    Number of Children: N/A  . Years of Education: N/A   Occupational History  . disabled    Social History Main Topics  . Smoking status: Never Smoker   . Smokeless tobacco: Never Used  . Alcohol Use: No  . Drug Use: No  . Sexually Active: Not on file   Other Topics Concern  . Not on file   Social History Narrative  . No narrative on file     Review of Systems: Constitutional: negative for chills, fever, night sweats, or fatigue  HEENT: negative for vision  changes, hearing loss, congestion, rhinorrhea, or epistaxis Cardiovascular: negative for chest pain, palpitations, diaphoresis, DOE, orthopnea, or edema Respiratory: negative for hemoptysis, wheezing, shortness of breath, dyspnea, or cough Abdominal: negative for abdominal pain, nausea, vomiting, diarrhea, or constipation Dermatological: negative for rash, erythema, or wounds Neurologic: negative for headache, dizziness, or syncope.  Positive for paresthesias in feet Renal:  Negative for polyuria, polydipsia, or dysuria All other systems reviewed and are otherwise negative with the exception to those above and in the HPI.   Physical Exam: Blood pressure 133/82, pulse 77, temperature 98.3 F (36.8 C), resp. rate 18, height 5' 0.5" (1.537 m), weight 295 lb (133.811 kg), last menstrual period 04/23/2012., Body mass index is 56.66 kg/(m^2). General: Well developed, well nourished, in no acute distress. Head: Normocephalic, atraumatic, eyes without discharge, sclera non-icteric, nares are without discharge. Bilateral auditory canals clear, TM's are without perforation, pearly grey and translucent with reflective cone of light bilaterally. Oral cavity moist, posterior pharynx without exudate, erythema, peritonsillar abscess, or post nasal drip.  Neck: Supple. No thyromegaly. Full ROM. No lymphadenopathy. Lungs: Clear bilaterally to auscultation without wheezes, rales, or rhonchi. Breathing is unlabored. Heart: RRR with S1 S2. No murmurs, rubs, or gallops appreciated. Abdomen: Soft, non-tender, non-distended with normoactive bowel sounds. No hepatosplenomegaly. No rebound/guarding. No obvious abdominal masses. Msk:  Strength and tone normal for age. Extremities/Skin: Warm and dry. No clubbing or cyanosis. No edema. No rashes, wounds, or suspicious lesions. Monofilament exam unremarkable bilaterally.  Neuro: Alert and oriented X 3. Moves all extremities spontaneously. Gait is normal. CNII-XII grossly in  tact. Psych:  Responds to questions appropriately with a normal affect.   Labs: Results for orders placed in visit on 06/18/12  POCT URINALYSIS DIPSTICK      Component Value Range   Color, UA yellow     Clarity, UA slightly cloudy     Glucose, UA neg     Bilirubin, UA neg     Ketones, UA neg     Spec Grav, UA >=1.030     Blood, UA small     pH, UA 5.5     Protein, UA neg     Urobilinogen, UA 0.2     Nitrite, UA neg     Leukocytes, UA small (1+)        ASSESSMENT AND PLAN:  41 y.o. year old female with morbid obesity and consequently, hypertension, low back pain, and diabetes.  She is a sweet individual who lives a marginal existence. - 1. Diabetes mellitus type 2 in obese  sitaGLIPtin (JANUVIA) 50 MG tablet  2. UTI (lower urinary tract infection)  POCT urinalysis dipstick, POCT UA - Microscopic Only, ciprofloxacin (CIPRO) 250 MG tablet  3. Pedal  edema  furosemide (LASIX) 40 MG tablet     Signed, Elvina Sidle, MD 06/18/2012 8:02 AM

## 2012-06-22 ENCOUNTER — Ambulatory Visit (HOSPITAL_COMMUNITY)
Admission: RE | Admit: 2012-06-22 | Discharge: 2012-06-22 | Disposition: A | Discharge status: Home / Self Care | Payer: Medicare Other | Attending: Psychiatry | Admitting: Psychiatry

## 2012-06-22 ENCOUNTER — Emergency Department (HOSPITAL_COMMUNITY)
Admission: EM | Admit: 2012-06-22 | Discharge: 2012-06-23 | Disposition: A | Discharge status: Other Acute Inpatient Hospital | Payer: Medicare Other | Source: Home / Self Care

## 2012-06-22 ENCOUNTER — Encounter (HOSPITAL_COMMUNITY): Payer: Self-pay | Admitting: Emergency Medicine

## 2012-06-22 DIAGNOSIS — F329 Major depressive disorder, single episode, unspecified: Secondary | ICD-10-CM | POA: Insufficient documentation

## 2012-06-22 DIAGNOSIS — F411 Generalized anxiety disorder: Secondary | ICD-10-CM | POA: Insufficient documentation

## 2012-06-22 DIAGNOSIS — E669 Obesity, unspecified: Secondary | ICD-10-CM

## 2012-06-22 DIAGNOSIS — Z79899 Other long term (current) drug therapy: Secondary | ICD-10-CM | POA: Insufficient documentation

## 2012-06-22 DIAGNOSIS — F319 Bipolar disorder, unspecified: Secondary | ICD-10-CM | POA: Insufficient documentation

## 2012-06-22 DIAGNOSIS — F3289 Other specified depressive episodes: Secondary | ICD-10-CM | POA: Insufficient documentation

## 2012-06-22 DIAGNOSIS — E119 Type 2 diabetes mellitus without complications: Secondary | ICD-10-CM | POA: Diagnosis not present

## 2012-06-22 HISTORY — DX: Bipolar disorder, unspecified: F31.9

## 2012-06-22 HISTORY — DX: Depression, unspecified: F32.A

## 2012-06-22 HISTORY — DX: Major depressive disorder, single episode, unspecified: F32.9

## 2012-06-22 LAB — RAPID URINE DRUG SCREEN, HOSP PERFORMED
Amphetamines: NOT DETECTED
Cocaine: NOT DETECTED
Opiates: NOT DETECTED
Tetrahydrocannabinol: NOT DETECTED

## 2012-06-22 LAB — COMPREHENSIVE METABOLIC PANEL
ALT: 24 U/L (ref 0–35)
AST: 20 U/L (ref 0–37)
Albumin: 3.2 g/dL — ABNORMAL LOW (ref 3.5–5.2)
Alkaline Phosphatase: 157 U/L — ABNORMAL HIGH (ref 39–117)
BUN: 12 mg/dL (ref 6–23)
Chloride: 99 mEq/L (ref 96–112)
Potassium: 3.7 mEq/L (ref 3.5–5.1)
Sodium: 138 mEq/L (ref 135–145)
Total Bilirubin: 0.2 mg/dL — ABNORMAL LOW (ref 0.3–1.2)
Total Protein: 8 g/dL (ref 6.0–8.3)

## 2012-06-22 LAB — CBC
HCT: 34.9 % — ABNORMAL LOW (ref 36.0–46.0)
RDW: 13.5 % (ref 11.5–15.5)
WBC: 8 10*3/uL (ref 4.0–10.5)

## 2012-06-22 LAB — ETHANOL: Alcohol, Ethyl (B): <11 mg/dL (ref 0–11)

## 2012-06-22 MED ORDER — ACETAMINOPHEN 325 MG PO TABS: 650.0000 mg | ORAL_TABLET | ORAL | Status: DC | PRN

## 2012-06-22 MED ORDER — DONEPEZIL HCL 10 MG PO TABS
10.0000 mg | ORAL_TABLET | Freq: Every day | ORAL | Status: DC
  Filled 2012-06-22: qty 1

## 2012-06-22 MED ORDER — METFORMIN HCL 500 MG PO TABS
500.0000 mg | ORAL_TABLET | Freq: Two times a day (BID) | ORAL | Status: DC
  Filled 2012-06-22 (×3): qty 1

## 2012-06-22 MED ORDER — ONDANSETRON HCL 4 MG PO TABS: 4.0000 mg | ORAL_TABLET | Freq: Three times a day (TID) | ORAL | Status: DC | PRN

## 2012-06-22 MED ORDER — ALUM & MAG HYDROXIDE-SIMETH 200-200-20 MG/5ML PO SUSP: 30.0000 mL | ORAL | Status: DC | PRN

## 2012-06-22 MED ORDER — PHENYTOIN 50 MG PO CHEW
50.0000 mg | CHEWABLE_TABLET | Freq: Every day | ORAL | Status: DC
  Filled 2012-06-22 (×2): qty 1

## 2012-06-22 MED ORDER — CIPROFLOXACIN HCL 250 MG PO TABS: 250.0000 mg | ORAL_TABLET | Freq: Two times a day (BID) | ORAL | Status: DC

## 2012-06-22 MED ORDER — INSULIN ASPART 100 UNIT/ML ~~LOC~~ SOLN: 0.0000 [IU] | Freq: Three times a day (TID) | SUBCUTANEOUS | Status: DC

## 2012-06-22 MED ORDER — ZOLPIDEM TARTRATE 5 MG PO TABS: 5.0000 mg | ORAL_TABLET | Freq: Every evening | ORAL | Status: DC | PRN

## 2012-06-22 MED ORDER — FUROSEMIDE 40 MG PO TABS
40.0000 mg | ORAL_TABLET | Freq: Every day | ORAL | Status: DC
  Filled 2012-06-22: qty 1

## 2012-06-22 MED ORDER — LINAGLIPTIN 5 MG PO TABS
5.0000 mg | ORAL_TABLET | Freq: Every day | ORAL | Status: DC
  Filled 2012-06-22: qty 1

## 2012-06-22 MED ORDER — CITALOPRAM HYDROBROMIDE 20 MG PO TABS
20.0000 mg | ORAL_TABLET | Freq: Every day | ORAL | Status: DC
  Filled 2012-06-22: qty 1

## 2012-06-22 MED ORDER — LORAZEPAM 1 MG PO TABS: 1.0000 mg | ORAL_TABLET | Freq: Three times a day (TID) | ORAL | Status: DC | PRN

## 2012-06-22 MED ORDER — PHENYTOIN SODIUM EXTENDED 100 MG PO CAPS: 200.0000 mg | ORAL_CAPSULE | Freq: Two times a day (BID) | ORAL | Status: DC

## 2012-06-22 NOTE — ED Notes (Signed)
Pt. has 1 bag of belongings -- 1 bag located in locker #4 in triage.

## 2012-06-22 NOTE — ED Notes (Signed)
Pt. in their underwear due to being on menses and hospital underwear does not fit. -- RN Misty Stanley aware.
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
## 2012-06-22 NOTE — ED Provider Notes (Signed)
Medical screening examination/treatment/procedure(s) were performed by non-physician practitioner and as supervising physician I was immediately available for consultation/collaboration.   Orian Figueira, MD 06/22/12 2329 

## 2012-06-22 NOTE — ED Notes (Signed)
Pt. Has 1 belongings bag per NT, [place].

## 2012-06-22 NOTE — ED Notes (Signed)
Pt. and belongings both wanded by security 

## 2012-06-22 NOTE — ED Notes (Signed)
Pt. in gown (due to size, unable to fit scrubs), and red socks.

## 2012-06-22 NOTE — ED Notes (Signed)
Pt sent here from [facility] for medical clearance  Pt states she is suffering from depression and feeling suicidal   Pt denies plan at this time  Pt states hx of same  Pt states she has bipolar and depression

## 2012-06-22 NOTE — Progress Notes (Signed)
Per Ala Dach at [facility], pt has not been officially accepted at this time. Once pt is medically cleared, [facility] will need to be notified to review pt's information for possible acceptance.

## 2012-06-22 NOTE — ED Provider Notes (Signed)
History     CSN: [id number]  Arrival date & time 06/22/12  2040   First MD Initiated Contact with Patient 06/22/12 2151      Chief Complaint  Patient presents with  . Medical Clearance    (Consider location/radiation/quality/duration/timing/severity/associated sxs/prior treatment) HPI History from patient. 41 year old female with past medical history bipolar disorder and depression presents for medical clearance. She states that she has been undergoing financial difficulties lately and has had worsening of her depression. She states that she feels suicidal. She has no plan at this time to act on this. She has felt this way in the past but has never had any suicide attempts. Denies any homicidal ideation. She has had to be inpatient at behavioral health in the past but not for many years. She is currently taking Celexa and Klonopin and Aricept for her symptoms. She denies any chest pain, shortness of breath, abdominal pain, nausea, vomiting, or other somatic symptoms at this time. Patient does also have a history of seizure disorder for which she takes Dilantin. She has been taking her medications as prescribed.  Past Medical History  Diagnosis Date  . Type II or unspecified type diabetes mellitus without mention of complication, not stated as uncontrolled   . Hypertension   . Seizures   . Sleep apnea   . Anxiety   . Depression   . Bipolar 1 disorder     Past Surgical History  Procedure Date  . Nasal sinus surgery     Family History  Problem Relation Age of Onset  . Diabetes Mother     History  Substance Use Topics  . Smoking status: Never Smoker   . Smokeless tobacco: Never Used  . Alcohol Use: No    OB History    Grav Para Term Preterm Abortions TAB SAB Ect Mult Living                  Review of Systems  Constitutional: Negative for fever, chills, activity change and appetite change.  HENT: Negative for congestion.   Respiratory: Negative for cough and  shortness of breath.   Cardiovascular: Negative for chest pain and palpitations.  Gastrointestinal: Negative for nausea, vomiting and abdominal pain.  Genitourinary: Negative for dysuria and flank pain.  Musculoskeletal: Negative for myalgias.  Skin: Negative for color change and rash.  Neurological: Negative for weakness.    Allergies  Hydrocodone  Home Medications   Current Outpatient Rx  Name Route Sig Dispense Refill  . CITALOPRAM HYDROBROMIDE 20 MG PO TABS Oral Take 20 mg by mouth daily.    Marland Kitchen CLONAZEPAM 0.5 MG PO TABS Oral Take 0.5 mg by mouth 3 (three) times daily as needed. For anxiety.    . DONEPEZIL HCL 10 MG PO TABS Oral Take 10 mg by mouth at bedtime.     Marland Kitchen METFORMIN HCL 500 MG PO TABS  TAKE 1 TABLET BY MOUTH TWICE A DAY WITH FOOD 60 tablet 0  . PHENYTOIN 50 MG PO CHEW Oral Chew 50 mg by mouth at bedtime.      Marland Kitchen PHENYTOIN SODIUM EXTENDED 100 MG PO CAPS Oral Take 200 mg by mouth 2 (two) times daily.     Marland Kitchen CIPROFLOXACIN HCL 250 MG PO TABS Oral Take 250 mg by mouth 2 (two) times daily.    . FUROSEMIDE 40 MG PO TABS Oral Take 40 mg by mouth daily.    Marland Kitchen SITAGLIPTIN PHOSPHATE 50 MG PO TABS Oral Take 1 tablet (50 mg total) by  mouth daily. 30 tablet 11    BP 137/88  Pulse 71  Temp 99.4 F (37.4 C) (Oral)  Resp 18  SpO2 99%  LMP 06/21/2012  Physical Exam  Nursing note and vitals reviewed. Constitutional: She is oriented to person, place, and time. She appears well-developed and well-nourished. No distress.  HENT:  Head: Normocephalic and atraumatic.  Mouth/Throat: Oropharynx is clear and moist. No oropharyngeal exudate.  Eyes: EOM are normal. Pupils are equal, round, and reactive to light.  Neck: Normal range of motion.  Cardiovascular: Normal rate, regular rhythm and normal heart sounds.   Pulmonary/Chest: Effort normal and breath sounds normal.  Abdominal: Soft. Bowel sounds are normal. There is no tenderness.  Musculoskeletal: Normal range of motion.  Neurological:  She is alert and oriented to person, place, and time. No cranial nerve deficit. Coordination normal.  Skin: Skin is warm and dry. She is not diaphoretic.  Psychiatric:       Flat affect, avoids eye contact, suicidal ideations without plan     ED Course  Procedures (including critical care time)  Labs Reviewed  CBC - Abnormal; Notable for the following:    RBC 3.77 (*)     Hemoglobin 11.5 (*)     HCT 34.9 (*)     All other components within normal limits  COMPREHENSIVE METABOLIC PANEL - Abnormal; Notable for the following:    Glucose, Bld 112 (*)     Albumin 3.2 (*)     Alkaline Phosphatase 157 (*)     Total Bilirubin 0.2 (*)     All other components within normal limits  URINE RAPID DRUG SCREEN (HOSP PERFORMED) - Abnormal; Notable for the following:    Benzodiazepines POSITIVE (*)     All other components within normal limits  ETHANOL  ACETAMINOPHEN LEVEL   No results found.   1. Diabetes mellitus type 2 in obese       MDM  Patient presents for medical clearance. She is medically cleared for evaluation by the ACT team at this time. ACT counselor aware and will see the patient. Patient agreeable.        Grant Fontana, PA-C 06/22/12 2307

## 2012-06-23 ENCOUNTER — Inpatient Hospital Stay (HOSPITAL_COMMUNITY)
Admission: EM | Admit: 2012-06-23 | Discharge: 2012-06-24 | DRG: 885 | Disposition: A | Discharge status: Home / Self Care | Payer: Medicare Other | Source: Ambulatory Visit | Attending: Psychiatry | Admitting: Psychiatry

## 2012-06-23 DIAGNOSIS — E119 Type 2 diabetes mellitus without complications: Secondary | ICD-10-CM | POA: Diagnosis present

## 2012-06-23 DIAGNOSIS — F332 Major depressive disorder, recurrent severe without psychotic features: Principal | ICD-10-CM

## 2012-06-23 DIAGNOSIS — G40802 Other epilepsy, not intractable, without status epilepticus: Secondary | ICD-10-CM | POA: Diagnosis not present

## 2012-06-23 DIAGNOSIS — I10 Essential (primary) hypertension: Secondary | ICD-10-CM | POA: Diagnosis present

## 2012-06-23 DIAGNOSIS — R45851 Suicidal ideations: Secondary | ICD-10-CM | POA: Diagnosis not present

## 2012-06-23 DIAGNOSIS — E669 Obesity, unspecified: Secondary | ICD-10-CM

## 2012-06-23 DIAGNOSIS — Z79899 Other long term (current) drug therapy: Secondary | ICD-10-CM | POA: Diagnosis not present

## 2012-06-23 DIAGNOSIS — F339 Major depressive disorder, recurrent, unspecified: Secondary | ICD-10-CM

## 2012-06-23 DIAGNOSIS — F411 Generalized anxiety disorder: Secondary | ICD-10-CM | POA: Diagnosis not present

## 2012-06-23 DIAGNOSIS — G4730 Sleep apnea, unspecified: Secondary | ICD-10-CM | POA: Diagnosis not present

## 2012-06-23 DIAGNOSIS — R413 Other amnesia: Secondary | ICD-10-CM | POA: Diagnosis not present

## 2012-06-23 DIAGNOSIS — Z9149 Other personal history of psychological trauma, not elsewhere classified: Secondary | ICD-10-CM | POA: Diagnosis not present

## 2012-06-23 DIAGNOSIS — F070 Personality change due to known physiological condition: Secondary | ICD-10-CM | POA: Diagnosis present

## 2012-06-23 LAB — GLUCOSE, CAPILLARY
Glucose-Capillary: 121 mg/dL — ABNORMAL HIGH (ref 70–99)
Glucose-Capillary: 158 mg/dL — ABNORMAL HIGH (ref 70–99)
Glucose-Capillary: 96 mg/dL (ref 70–99)
Glucose-Capillary: 124 mg/dL — ABNORMAL HIGH (ref 70–99)

## 2012-06-23 MED ORDER — METFORMIN HCL 500 MG PO TABS
500.0000 mg | ORAL_TABLET | Freq: Two times a day (BID) | ORAL | Status: DC
  Administered 2012-06-23 – 2012-06-24 (×2): 500 mg via ORAL
  Filled 2012-06-23 (×4): qty 1

## 2012-06-23 MED ORDER — CITALOPRAM HYDROBROMIDE 40 MG PO TABS
40.0000 mg | ORAL_TABLET | Freq: Every day | ORAL | Status: DC
  Administered 2012-06-24: 40 mg via ORAL
  Filled 2012-06-23 (×3): qty 1

## 2012-06-23 MED ORDER — INSULIN ASPART 100 UNIT/ML ~~LOC~~ SOLN: 0.0000 [IU] | Freq: Three times a day (TID) | SUBCUTANEOUS | Status: DC

## 2012-06-23 MED ORDER — TRAZODONE HCL 100 MG PO TABS
100.0000 mg | ORAL_TABLET | Freq: Every evening | ORAL | Status: DC | PRN
  Administered 2012-06-23: 100 mg via ORAL
  Filled 2012-06-23: qty 1

## 2012-06-23 MED ORDER — ALUM & MAG HYDROXIDE-SIMETH 200-200-20 MG/5ML PO SUSP: 30.0000 mL | ORAL | Status: DC | PRN

## 2012-06-23 MED ORDER — CITALOPRAM HYDROBROMIDE 20 MG PO TABS
20.0000 mg | ORAL_TABLET | Freq: Every day | ORAL | Status: DC
  Administered 2012-06-23: 20 mg via ORAL
  Filled 2012-06-23 (×3): qty 1

## 2012-06-23 MED ORDER — PHENYTOIN 50 MG PO CHEW: 50.0000 mg | CHEWABLE_TABLET | Freq: Every day | ORAL | Status: DC

## 2012-06-23 MED ORDER — INSULIN ASPART 100 UNIT/ML ~~LOC~~ SOLN
6.0000 [IU] | Freq: Three times a day (TID) | SUBCUTANEOUS | Status: DC
  Administered 2012-06-23: 18:00:00 via SUBCUTANEOUS
  Administered 2012-06-24 (×2): 6 [IU] via SUBCUTANEOUS

## 2012-06-23 MED ORDER — PHENYTOIN SODIUM EXTENDED 30 MG PO CAPS
60.0000 mg | ORAL_CAPSULE | Freq: Every day | ORAL | Status: DC
  Administered 2012-06-23: 60 mg via ORAL
  Filled 2012-06-23 (×2): qty 2

## 2012-06-23 MED ORDER — LINAGLIPTIN 5 MG PO TABS
5.0000 mg | ORAL_TABLET | Freq: Every day | ORAL | Status: DC
  Administered 2012-06-23 – 2012-06-24 (×2): 5 mg via ORAL
  Filled 2012-06-23 (×3): qty 1

## 2012-06-23 MED ORDER — FUROSEMIDE 40 MG PO TABS
40.0000 mg | ORAL_TABLET | Freq: Every day | ORAL | Status: DC
  Administered 2012-06-23 – 2012-06-24 (×2): 40 mg via ORAL
  Filled 2012-06-23 (×4): qty 1

## 2012-06-23 MED ORDER — PHENYTOIN SODIUM EXTENDED 100 MG PO CAPS
200.0000 mg | ORAL_CAPSULE | Freq: Two times a day (BID) | ORAL | Status: DC
  Administered 2012-06-23 – 2012-06-24 (×2): 200 mg via ORAL
  Filled 2012-06-23 (×4): qty 2

## 2012-06-23 MED ORDER — DONEPEZIL HCL 10 MG PO TABS
10.0000 mg | ORAL_TABLET | Freq: Every day | ORAL | Status: DC
  Administered 2012-06-23: 10 mg via ORAL
  Filled 2012-06-23 (×2): qty 1

## 2012-06-23 MED ORDER — ACETAMINOPHEN 325 MG PO TABS
650.0000 mg | ORAL_TABLET | Freq: Four times a day (QID) | ORAL | Status: DC | PRN
  Administered 2012-06-23: 650 mg via ORAL

## 2012-06-23 MED ORDER — MAGNESIUM HYDROXIDE 400 MG/5ML PO SUSP: 30.0000 mL | Freq: Every day | ORAL | Status: DC | PRN

## 2012-06-23 NOTE — Progress Notes (Signed)
Admission Note: This is a 41 year old AAF admitted to the unit this morning for depression. Pt tearful on admission and unwilling to talk about her stressors. She reported that her power was turned off because she was unable to make payment. Also stated that her she is on disability and un very limited income. Patient's mood/ affect depressed and sad. She has medical history of Type 2 diabetes, Hypertension, Seizure disorder and obesity. Pt weighs 295 lbs. She endorsed suicide thoughts, but contracted for safety. She complaint of having head ache on admission and received Tylenol for the headache on arrival to the unit.

## 2012-06-23 NOTE — Therapy (Signed)
Psychoeducational Group Note  Date:  06/23/2012 Time:  2005   Group Topic/Focus:  Wrap-Up Group:   The focus of this group is to help patients review their daily goal of treatment and discuss progress on daily workbooks.  Participation Level:  Minimal  Participation Quality:  Resistant  Affect:  Anxious  Cognitive:  Oriented  Insight:  Limited  Engagement in Group:  Limited  Additional Comments:   Patient attended wrap-up group this evening.  Sharnese Heath, Newton Pigg 06/23/2012, 8:41 PM

## 2012-06-23 NOTE — Progress Notes (Signed)
D: Order for pt. To use c-pap with O2 @ HS . Unable to obtain tubing for c-pap. AC contacted & still unable to obtain tubing.

## 2012-06-23 NOTE — [facility] Suicide Risk Assessment (Signed)
Suicide Risk Assessment  Admission Assessment     Demographic factors:    Current Mental Status:    Loss Factors:    Historical Factors:    Risk Reduction Factors:     CLINICAL FACTORS:   Severe Anxiety and/or Agitation Depression:   Anhedonia Hopelessness Insomnia Severe More than one psychiatric diagnosis Previous Psychiatric Diagnoses and Treatments Medical Diagnoses and Treatments/Surgeries  COGNITIVE FEATURES THAT CONTRIBUTE TO RISK:  Thought constriction (tunnel vision)    Diagnosis:  Axis I:  Major Depressive Disorder - Recurrent - Severe.  Generalized Anxiety Disorder.  The patient was seen today and reports the following:   ADL's: Intact.  Sleep: The patient reports to having significant difficulty initiating and maintaining sleep.  Appetite: The patient reports that her appetite is decreased.   Mild>(1-10) >Severe  Hopelessness (1-10): 10  Depression (1-10): 10  Anxiety (1-10): 10   Suicidal Ideation: The patient reports passive suicidal ideations today but with no plan or intent.  Plan: No  Intent: No  Means: No   Homicidal Ideation: The patient denies any homicidal ideations today.  Plan: No  Intent: No.  Means: No   General Appearance/Behavior: The patient was friendly and cooperative today with this provider but appears significantly depressed.  Eye Contact: Good.  Speech: Appropriate in rate and volume today with no pressuring noted.  Motor Behavior: wnl.  Level of Consciousness: Alert and Oriented x 3.  Mental Status: Alert and Oriented x 3.  Mood: Appears severely depressed.  Affect: Essentially flat.  Anxiety Level: Severe anxiety reported today.  Thought Process: wnl  Thought Content: The patient denies any auditory or visual hallucinations or any delusional thinking today.   Perception: wnl  Judgment: Fair.  Insight: Fair.  Cognition: Oriented to person, place and time.   Review of Systems:  Neurological: The patient denies any  headaches today. She reports a seizure disorder which is under good control with medications.  G.I.: The patient denies any constipation or G.I. Upset today.  Musculoskeletal: The patient denies any musculoskeletal issues today.   Current Medications:    . citalopram  40 mg Oral Daily  . donepezil  10 mg Oral QHS  . furosemide  40 mg Oral Daily  . insulin aspart  0-20 Units Subcutaneous TID WC  . insulin aspart  6 Units Subcutaneous TID WC  . linagliptin  5 mg Oral Daily  . metFORMIN  500 mg Oral BID WC  . phenytoin  200 mg Oral BID  . phenytoin  60 mg Oral QHS  . DISCONTD: citalopram  20 mg Oral Daily  . DISCONTD: phenytoin  50 mg Oral QHS   Time was spent today discussing with the patient her current symptoms. The patient states that she is having significant difficulty initiating and maintaining sleep and reports a poor appetite.  She reports severe feelings of sadness, anhedonia and depressed mood as well as severe anxiety symptoms today. She reports passive suicidal ideations with no plan or intent and denies any homicidal ideations today.  She also denies any auditory or visual hallucinations or delusional thinking.   The patient states that she is also being treated for "memory loss."  She states she is having significant financial difficulty and is hoping [facility] can help with this.   Treatment Plan Summary:  1. Daily contact with patient to assess and evaluate symptoms and progress in treatment.  2. Medication management  3. The patient will deny suicidal ideations or homicidal ideations for 48 hours prior  to discharge and have a depression and anxiety rating of 3 or less. The patient will also deny any auditory or visual hallucinations or delusional thinking.  4. The patient will deny any symptoms of substance withdrawal at time of discharge.   Plan:  1. Will continue the patient on her current medications as listed above.  2. Will increase the medication Celexa to 40 mgs po q  am for depression.  3. Will order CPAP for the patient whenever she is sleeping. 4. Laboratory studies reviewed.  5. Will continue to monitor.   SUICIDE RISK:  Mild:  Suicidal ideation of limited frequency, intensity, duration, and specificity.  There are no identifiable plans, no associated intent, mild dysphoria and related symptoms, good self-control (both objective and subjective assessment), few other risk factors, and identifiable protective factors, including available and accessible social support.  Brandi Gomez 06/23/2012, 9:16 PM

## 2012-06-23 NOTE — Progress Notes (Signed)
Psychoeducational Group Note  Date:  06/23/2012 Time:  1100  Group Topic/Focus:  Recovery Goals:   The focus of this group is to identify appropriate goals for recovery and establish a plan to achieve them.  Participation Level:  Did Not Attend  Participation Quality:  Did not attend  Affect:  Appropriate  Cognitive:  Appropriate  Insight:  Did not attend  Engagement in Group:  Did not attend  Additional Comments:  Was meeting with MD during group  Meredith Staggers 06/23/2012, 7:31 PM

## 2012-06-23 NOTE — Progress Notes (Signed)
Patient ID: Brandi Gomez, female   DOB: June 20, 1971, 41 y.o.   MRN: [id number] D-Patient was admitted early this am and sleep was therefore poor.  She also has sleep apnea and did not bring her own machine.  She reports her depression and her anxiety are both 10.  She reports she is having financial struggles that have resulted in her power being turned off and needs help solving this problem,  Pt's daughter lives with her.  Patient was tearful and sad while talking about her issues.  She met with MD and later with treatment team.  Later pt told RN her depression had turned to anger because she got the impression the Team thought she had just come here to get help with bills.  Pt was again tearful and asking to leave. A- Talked with patient and reassured her about the Team's empathy for her situation.  Encouraged her to be open to help and that case manager plans to contact her home support team.R- Pt says that she has trouble advocating for herself and navigating systems to get help.  She  Attended groups this afternoon.  Bariatric bed ordered to increase patient's comfort sleeping.

## 2012-06-23 NOTE — Progress Notes (Signed)
[facility] Group Notes: (Counselor/Nursing/MHT/Case Management/Adjunct) 06/23/2012   @ 1:15-2:30PM Feelings About Diagnosis  Type of Therapy:  Group Therapy  Participation Level:  Minimal  Participation Quality: Drowsy    Affect:  Blunted  Cognitive:  Appropriate  Insight:  None  Engagement in Group: Limited  Engagement in Therapy:  Minimal  Modes of Intervention:  Support and Exploration  Summary of Progress/Problems: Maelee was present but not very engaged in group. She slept for a portion of group and when she woke up was more vocal. She talked briefly about finding support in places outside of the family when the family is toxic. Davelyn also seemed to relate with the concept of people thinking a person is mental illness is fragile and cannot live a fulfilling life.   Angus Palms, LCSW 06/23/2012  3:08 PM

## 2012-06-23 NOTE — ED Notes (Signed)
Report called to [facility] at [facility]. Transported by security and staff member.

## 2012-06-23 NOTE — [facility] Counselor (Signed)
Pt has been accepted to [facility] for tx. Dr. Dan Humphreys attending physician; [phone]

## 2012-06-23 NOTE — Progress Notes (Addendum)
Pt attended discharge planning group and actively participated in group.  SW provided pt with today's workbook.  Pt presents with flat affect and depressed mood.  Pt rates depression and anxiety at a 10 today.  Pt reports having SI, and doesn't want to live.  Pt states that she is having financial problems and being depressed.  Pt states that she lives in [place] with her 42 year old.  Pt states that she is on disability.  Pt states that she was going to [facility] for medication management.  SW contacted [facility] to see if a referral could be made for CST.  The intake coordinator states that pt would be appropriate for this and an intake appointment was scheduled for this week.  No further needs voiced by pt at this time.    Reyes Ivan, LCSWA 06/23/2012  10:31 AM    Per State Regulation 482.30 This Chart was reviewed for medical necessity with respect to the patient's Admission/Duration of stay.   Carmina Miller  06/23/2012  Next Review Date:  06/26/12

## 2012-06-23 NOTE — Tx Team (Signed)
Initial Interdisciplinary Treatment Plan  PATIENT STRENGTHS: (choose at least two) Capable of independent living Motivation for treatment/growth Religious Affiliation Supportive family/friends  PATIENT STRESSORS: Financial difficulties Medication change or noncompliance   PROBLEM LIST: Problem List/Patient Goals Date to be addressed Date deferred Reason deferred Estimated date of resolution  Depression 06/23/12     Suicide thoughts 06/23/12                                                DISCHARGE CRITERIA:  Ability to meet basic life and health needs Adequate post-discharge living arrangements Improved stabilization in mood, thinking, and/or behavior Medical problems require only outpatient monitoring Motivation to continue treatment in a less acute level of care Reduction of life-threatening or endangering symptoms to within safe limits  PRELIMINARY DISCHARGE PLAN: Outpatient therapy Participate in family therapy Return to previous living arrangement  PATIENT/FAMIILY INVOLVEMENT: This treatment plan has been presented to and reviewed with the patient, Brandi Gomez, and/or family member.  The patient and family have been given the opportunity to ask questions and make suggestions.  Roselie Skinner [facility] 06/23/2012, 3:13 AM
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
## 2012-06-23 NOTE — Tx Team (Signed)
Interdisciplinary Treatment Plan Update (Adult)  Date:  06/23/2012  Time Reviewed:  10:50 AM   Progress in Treatment: Attending groups: Yes Participating in groups:  Yes Taking medication as prescribed: Yes Tolerating medication:  Yes Family/Significant other contact made:  Counselor will assess for appropriate contact Patient understands diagnosis:  Yes Discussing patient identified problems/goals with staff:  Yes Medical problems stabilized or resolved:  Yes Denies suicidal/homicidal ideation: Yes Issues/concerns per patient self-inventory:  None identified Other: N/A  New problem(s) identified: None Identified  Reason for Continuation of Hospitalization: Anxiety Depression Medication stabilization Suicidal ideation  Interventions implemented related to continuation of hospitalization: mood stabilization, medication monitoring and adjustment, group therapy and psycho education, safety checks q 15 mins  Additional comments: N/A  Estimated length of stay: 3-5 days  Discharge Plan: Pt will [facility] with [facility] for medication management and therapy  New goal(s): N/A  Review of initial/current patient goals per problem list:    1.  Goal(s): Reduce depressive symptoms  Met:  No  Target date: by discharge  As evidenced by: Reducing depression from a 10 to a 3 as reported by pt.   2.  Goal (s): Reduce/Eliminate suicidal ideation  Met:  No  Target date: by discharge  As evidenced by: pt reporting no SI.    3.  Goal(s): Reduce anxiety symptoms  Met:  No  Target date: by discharge  As evidenced by: Reduce anxiety from a 10 to a 3 as reported by pt.    Attendees: Patient:  Brandi Gomez  06/23/2012 10:52 AM   Family:     Physician:     Nursing:  Barrie Folk, RN 06/23/2012 10:52 AM   Case Manager:  Reyes Ivan, LCSWA 06/23/2012  10:50 AM   Counselor:  Angus Palms, LCSW 06/23/2012  10:50 AM   Other:  Juline Patch, LCSW 06/23/2012  10:50  AM   Other:  Carney Living, RN 06/23/2012 10:52 AM   Other:     Other:      Scribe for Treatment Team:   Carmina Miller, 06/23/2012 , 10:50 AM

## 2012-06-24 LAB — URINALYSIS, ROUTINE W REFLEX MICROSCOPIC
Nitrite: NEGATIVE
Protein, ur: 30 mg/dL — AB
Urobilinogen, UA: 0.2 mg/dL (ref 0.0–1.0)

## 2012-06-24 LAB — URINE MICROSCOPIC-ADD ON

## 2012-06-24 MED ORDER — PHENYTOIN SODIUM EXTENDED 100 MG PO CAPS: 200.0000 mg | ORAL_CAPSULE | Freq: Two times a day (BID) | ORAL | Status: DC

## 2012-06-24 MED ORDER — TRAZODONE HCL 100 MG PO TABS
100.0000 mg | ORAL_TABLET | Freq: Every day | ORAL | Status: DC
  Filled 2012-06-24: qty 1

## 2012-06-24 MED ORDER — CITALOPRAM HYDROBROMIDE 20 MG PO TABS
20.0000 mg | ORAL_TABLET | Freq: Every day | ORAL | Status: DC
  Filled 2012-06-24: qty 2

## 2012-06-24 MED ORDER — DONEPEZIL HCL 10 MG PO TABS: 10.0000 mg | ORAL_TABLET | Freq: Every day | ORAL | Status: DC

## 2012-06-24 MED ORDER — CITALOPRAM HYDROBROMIDE 20 MG PO TABS: 40.0000 mg | ORAL_TABLET | Freq: Every day | ORAL | Status: DC

## 2012-06-24 MED ORDER — METFORMIN HCL 500 MG PO TABS: 500.0000 mg | ORAL_TABLET | Freq: Two times a day (BID) | ORAL | Status: DC

## 2012-06-24 MED ORDER — PHENYTOIN 50 MG PO CHEW: 50.0000 mg | CHEWABLE_TABLET | Freq: Every day | ORAL | Status: DC

## 2012-06-24 MED ORDER — SITAGLIPTIN PHOSPHATE 50 MG PO TABS: 50.0000 mg | ORAL_TABLET | Freq: Every day | ORAL | Status: DC

## 2012-06-24 MED ORDER — CITALOPRAM HYDROBROMIDE 20 MG PO TABS: 20.0000 mg | ORAL_TABLET | Freq: Every day | ORAL | Status: DC

## 2012-06-24 MED ORDER — CLONAZEPAM 0.5 MG PO TABS: 0.5000 mg | ORAL_TABLET | Freq: Three times a day (TID) | ORAL | Status: DC | PRN

## 2012-06-24 MED ORDER — TRAZODONE HCL 100 MG PO TABS: 100.0000 mg | ORAL_TABLET | Freq: Every day | ORAL | Status: DC

## 2012-06-24 MED ORDER — FUROSEMIDE 40 MG PO TABS: 40.0000 mg | ORAL_TABLET | Freq: Every day | ORAL | Status: DC

## 2012-06-24 NOTE — Progress Notes (Signed)
Pt d/c from hospital with family. All items returned. D/C instructions given, samples given and prescriptions given. Pt denies si and hi.

## 2012-06-24 NOTE — Progress Notes (Signed)
No tubings available for CPAP on the unit. Night AC notified. He suggested to give the oxygen 2 L as ordered by physician without CPAP for the night. Pt received oxygen 2 L via nasal canula for tonight. Writer to report to day nurse to follow up with getting CPAP tubings to the unit in the morning or have patient call her family to bring in her CPAP and her tubing from home for her.
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
## 2012-06-24 NOTE — Progress Notes (Signed)
[facility] Group Notes:  (Counselor/Nursing/MHT/Case Management/Adjunct)  06/24/2012 11:29 AM  Type of Therapy:  Psychoeducational Skills  Participation Level:  Active  Participation Quality:  Appropriate, Attentive and Sharing  Affect:  Appropriate  Cognitive:  Alert, Appropriate and Oriented  Insight:  Limited  Engagement in Group:  Good  Engagement in Therapy:  n/a  Modes of Intervention:  Activity, Clarification, Education, Problem-solving, Socialization and Support  Summary of Progress/Problems: [place] attended Psychoeducational group on goals. The focus of this group is to help patients establish daily goals to achieve during treatment and discuss how the patient can incorporate goal setting into their daily lives to aide in recovery. Calin was active while group discussed what makes a SMART goal, an activity reading empowering passages, and creating one SMART goal to work on for the day.    Wandra Scot 06/24/2012, 11:29 AM

## 2012-06-24 NOTE — Progress Notes (Signed)
[facility] MD Progress Note  06/24/2012 1:46 PM  Diagnosis:   Axis I: Generalized Anxiety Disorder, Major Depression, Recurrent severe and Memory loss responding to Arecept Axis II: Deferred Axis III:  Past Medical History  Diagnosis Date  . Type II or unspecified type diabetes mellitus without mention of complication, not stated as uncontrolled   . Hypertension   . Seizures   . Sleep apnea   . Anxiety   . Depression   . Bipolar 1 disorder    Axis IV: other psychosocial or environmental problems Axis V: 61-70 mild symptoms  ADL's:  Intact  Sleep: Good  Appetite:  Good  Suicidal Ideation:  Pt denies any thoughts, plans, intent of suicide Homicidal Ideation:  Pt denies any thoughts, plans, intent of homicide  Mental Status Examination/Evaluation: Objective:  Appearance: Casual  Eye Contact::  Good  Speech:  Clear and Coherent  Volume:  Normal  Mood:  Euthymic  Affect:  Congruent  Thought Process:  Coherent  Orientation:  Full  Thought Content:  WDL  Suicidal Thoughts:  No  Homicidal Thoughts:  No  Memory:  Immediate;   Fair Recent;   Fair Remote;   Fair  Judgement:  Fair  Insight:  Fair  Psychomotor Activity:  Normal  Concentration:  Good  Recall:  Good  Akathisia:  No  AIMS (if indicated):     Assets:  Communication Skills Desire for Improvement  Sleep:  Number of Hours: 5.75    Vital Signs:Blood pressure 122/81, pulse 64, temperature 98.1 F (36.7 C), temperature source Oral, resp. rate 18, height 5' 0.75" (1.543 m), weight 133.811 kg (295 lb), last menstrual period 06/21/2012. Current Medications: Current Facility-Administered Medications  Medication Dose Route Frequency Provider Last Rate Last Dose  . acetaminophen (TYLENOL) tablet 650 mg  650 mg Oral Q6H PRN Sanjuana Kava, NP   650 mg at 06/23/12 0409  . alum & mag hydroxide-simeth (MAALOX/MYLANTA) 200-200-20 MG/5ML suspension 30 mL  30 mL Oral Q4H PRN Sanjuana Kava, NP      . citalopram (CELEXA) tablet 40  mg  40 mg Oral Daily Curlene Labrum Readling, MD   40 mg at 06/24/12 0824  . donepezil (ARICEPT) tablet 10 mg  10 mg Oral QHS Sanjuana Kava, NP   10 mg at 06/23/12 2158  . furosemide (LASIX) tablet 40 mg  40 mg Oral Daily Sanjuana Kava, NP   40 mg at 06/24/12 1914  . insulin aspart (novoLOG) injection 0-20 Units  0-20 Units Subcutaneous TID WC Sanjuana Kava, NP      . insulin aspart (novoLOG) injection 6 Units  6 Units Subcutaneous TID WC Sanjuana Kava, NP   6 Units at 06/24/12 1201  . linagliptin (TRADJENTA) tablet 5 mg  5 mg Oral Daily Sanjuana Kava, NP   5 mg at 06/24/12 0823  . magnesium hydroxide (MILK OF MAGNESIA) suspension 30 mL  30 mL Oral Daily PRN Sanjuana Kava, NP      . metFORMIN (GLUCOPHAGE) tablet 500 mg  500 mg Oral BID WC Sanjuana Kava, NP   500 mg at 06/24/12 0823  . phenytoin (DILANTIN) ER capsule 200 mg  200 mg Oral BID Sanjuana Kava, NP   200 mg at 06/24/12 0824  . phenytoin (DILANTIN) ER capsule 60 mg  60 mg Oral QHS Mike Craze, MD   60 mg at 06/23/12 2157  . traZODone (DESYREL) tablet 100 mg  100 mg Oral QHS Mike Craze, MD      .  DISCONTD: citalopram (CELEXA) tablet 20 mg  20 mg Oral Daily Sanjuana Kava, NP   20 mg at 06/23/12 1616  . DISCONTD: citalopram (CELEXA) tablet 20 mg  20 mg Oral Daily Mike Craze, MD      . DISCONTD: phenytoin (DILANTIN) tablet 50 mg  50 mg Oral QHS Sanjuana Kava, NP      . DISCONTD: traZODone (DESYREL) tablet 100 mg  100 mg Oral QHS PRN Sanjuana Kava, NP   100 mg at 06/23/12 2202    Lab Results:  Results for orders placed during the hospital encounter of 06/23/12 (from the past 48 hour(s))  GLUCOSE, CAPILLARY     Status: Abnormal   Collection Time   06/23/12  6:08 AM      Component Value Range Comment   Glucose-Capillary 121 (*) 70 - 99 mg/dL   URINALYSIS, ROUTINE W REFLEX MICROSCOPIC     Status: Abnormal   Collection Time   06/23/12  6:30 AM      Component Value Range Comment   Color, Urine AMBER (*) YELLOW BIOCHEMICALS MAY BE AFFECTED  BY COLOR   APPearance CLOUDY (*) CLEAR    Specific Gravity, Urine 1.026  1.005 - 1.030    pH 5.5  5.0 - 8.0    Glucose, UA NEGATIVE  NEGATIVE mg/dL    Hgb urine dipstick LARGE (*) NEGATIVE    Bilirubin Urine NEGATIVE  NEGATIVE    Ketones, ur NEGATIVE  NEGATIVE mg/dL    Protein, ur 30 (*) NEGATIVE mg/dL    Urobilinogen, UA 0.2  0.0 - 1.0 mg/dL    Nitrite NEGATIVE  NEGATIVE    Leukocytes, UA SMALL (*) NEGATIVE   URINE MICROSCOPIC-ADD ON     Status: Abnormal   Collection Time   06/23/12  6:30 AM      Component Value Range Comment   Squamous Epithelial / LPF FEW (*) RARE    WBC, UA 7-10  <3 WBC/hpf    RBC / HPF 21-50  <3 RBC/hpf    Bacteria, UA FEW (*) RARE    Urine-Other AMORPHOUS URATES/PHOSPHATES     GLUCOSE, CAPILLARY     Status: Abnormal   Collection Time   06/23/12 11:51 AM      Component Value Range Comment   Glucose-Capillary 158 (*) 70 - 99 mg/dL   GLUCOSE, CAPILLARY     Status: Normal   Collection Time   06/23/12  5:10 PM      Component Value Range Comment   Glucose-Capillary 96  70 - 99 mg/dL   GLUCOSE, CAPILLARY     Status: Abnormal   Collection Time   06/23/12  9:24 PM      Component Value Range Comment   Glucose-Capillary 124 (*) 70 - 99 mg/dL   GLUCOSE, CAPILLARY     Status: Abnormal   Collection Time   06/24/12  6:16 AM      Component Value Range Comment   Glucose-Capillary 118 (*) 70 - 99 mg/dL   GLUCOSE, CAPILLARY     Status: Abnormal   Collection Time   06/24/12 11:47 AM      Component Value Range Comment   Glucose-Capillary 114 (*) 70 - 99 mg/dL     Physical Findings: AIMS: Facial and Oral Movements Muscles of Facial Expression: None, normal Lips and Perioral Area: None, normal Jaw: None, normal Tongue: None, normal,Extremity Movements Upper (arms, wrists, hands, fingers): None, normal Lower (legs, knees, ankles, toes): None, normal, Trunk Movements Neck, shoulders, hips:  None, normal, Overall Severity Severity of abnormal movements (highest score  from questions above): None, normal Incapacitation due to abnormal movements: None, normal Patient's awareness of abnormal movements (rate only patient's report): No Awareness, Dental Status Current problems with teeth and/or dentures?: No Does patient usually wear dentures?: No  CIWA:    COWS:     Treatment Plan Summary: Daily contact with patient to assess and evaluate symptoms and progress in treatment Medication management  Plan: D/C today, pt has several things to get straightened out at home and has a plan to do so now.  Brandi Gomez 06/24/2012, 1:46 PM

## 2012-06-24 NOTE — Progress Notes (Signed)
Pt's depression is down to 1 on a 1-10 scale with 10 being the worst. Pt reports that she is feeling better since coming to the hospital and talking with staff and peers. She says that it has helped to talk with others that are dealing with depression. Pt reports that she felt overwhelmed with family and financial stressors before coming to the hospital. Encouraged pt to express feelings and concerns. Gave scheduled medications as ordered. Pt continues to work on Pharmacologist for depression and stress. She denies si and hi. Safety maintained on unit.
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
## 2012-06-24 NOTE — [facility] Suicide Risk Assessment (Signed)
Suicide Risk Assessment  Discharge Assessment     Demographic factors:       Current Mental Status Per Nursing Assessment::   On Admission:    At Discharge:     Current Mental Status Per Physician:  Loss Factors:    Historical Factors:    Risk Reduction Factors:      Continued Clinical Symptoms:  Severe Anxiety and/or Agitation Depression:   Insomnia Epilepsy Previous Psychiatric Diagnoses and Treatments  Discharge Diagnoses:   Diagnosis:   Axis I: Generalized Anxiety Disorder, Major Depression, Recurrent severe and Memory loss responding to Arecept Axis II: Deferred Axis III:  Past Medical History  Diagnosis Date  . Type II or unspecified type diabetes mellitus without mention of complication, not stated as uncontrolled   . Hypertension   . Seizures   . Sleep apnea   . Anxiety   . Depression   . Bipolar 1 disorder    Axis IV: other psychosocial or environmental problems Axis V: 61-70 mild symptoms  ADL's:  Intact  Sleep: Good  Appetite:  Good  Suicidal Ideation:  Pt denies any thoughts, plans, intent of suicide Homicidal Ideation:  Pt denies any thoughts, plans, intent of homicide  Mental Status Examination/Evaluation: Objective:  Appearance: Casual  Eye Contact::  Good  Speech:  Clear and Coherent  Volume:  Normal  Mood:  Euthymic  Affect:  Congruent  Thought Process:  Coherent  Orientation:  Full  Thought Content:  WDL  Suicidal Thoughts:  No  Homicidal Thoughts:  No  Memory:  Immediate;   Fair Recent;   Fair Remote;   Fair  Judgement:  Fair  Insight:  Fair  Psychomotor Activity:  Normal  Concentration:  Good  Recall:  Good  Akathisia:  No  AIMS (if indicated):     Assets:  Communication Skills Desire for Improvement  Sleep:  Number of Hours: 5.75    Vital Signs:Blood pressure 122/81, pulse 64, temperature 98.1 F (36.7 C), temperature source Oral, resp. rate 18, height 5' 0.75" (1.543 m), weight 133.811 kg (295 lb), last  menstrual period 06/21/2012. Current Medications: Current Facility-Administered Medications  Medication Dose Route Frequency Provider Last Rate Last Dose  . acetaminophen (TYLENOL) tablet 650 mg  650 mg Oral Q6H PRN Sanjuana Kava, NP   650 mg at 06/23/12 0409  . alum & mag hydroxide-simeth (MAALOX/MYLANTA) 200-200-20 MG/5ML suspension 30 mL  30 mL Oral Q4H PRN Sanjuana Kava, NP      . citalopram (CELEXA) tablet 40 mg  40 mg Oral Daily Curlene Labrum Readling, MD   40 mg at 06/24/12 0824  . donepezil (ARICEPT) tablet 10 mg  10 mg Oral QHS Sanjuana Kava, NP   10 mg at 06/23/12 2158  . furosemide (LASIX) tablet 40 mg  40 mg Oral Daily Sanjuana Kava, NP   40 mg at 06/24/12 4098  . insulin aspart (novoLOG) injection 0-20 Units  0-20 Units Subcutaneous TID WC Sanjuana Kava, NP      . insulin aspart (novoLOG) injection 6 Units  6 Units Subcutaneous TID WC Sanjuana Kava, NP   6 Units at 06/24/12 1201  . linagliptin (TRADJENTA) tablet 5 mg  5 mg Oral Daily Sanjuana Kava, NP   5 mg at 06/24/12 0823  . magnesium hydroxide (MILK OF MAGNESIA) suspension 30 mL  30 mL Oral Daily PRN Sanjuana Kava, NP      . metFORMIN (GLUCOPHAGE) tablet 500 mg  500 mg Oral BID WC  Sanjuana Kava, NP   500 mg at 06/24/12 4098  . phenytoin (DILANTIN) ER capsule 200 mg  200 mg Oral BID Sanjuana Kava, NP   200 mg at 06/24/12 0824  . phenytoin (DILANTIN) ER capsule 60 mg  60 mg Oral QHS Mike Craze, MD   60 mg at 06/23/12 2157  . traZODone (DESYREL) tablet 100 mg  100 mg Oral QHS Mike Craze, MD      . DISCONTD: citalopram (CELEXA) tablet 20 mg  20 mg Oral Daily Sanjuana Kava, NP   20 mg at 06/23/12 1616  . DISCONTD: citalopram (CELEXA) tablet 20 mg  20 mg Oral Daily Mike Craze, MD      . DISCONTD: phenytoin (DILANTIN) tablet 50 mg  50 mg Oral QHS Sanjuana Kava, NP      . DISCONTD: traZODone (DESYREL) tablet 100 mg  100 mg Oral QHS PRN Sanjuana Kava, NP   100 mg at 06/23/12 2202    Lab Results:  Results for orders placed during  the hospital encounter of 06/23/12 (from the past 72 hour(s))  GLUCOSE, CAPILLARY     Status: Abnormal   Collection Time   06/23/12  6:08 AM      Component Value Range Comment   Glucose-Capillary 121 (*) 70 - 99 mg/dL   URINALYSIS, ROUTINE W REFLEX MICROSCOPIC     Status: Abnormal   Collection Time   06/23/12  6:30 AM      Component Value Range Comment   Color, Urine AMBER (*) YELLOW BIOCHEMICALS MAY BE AFFECTED BY COLOR   APPearance CLOUDY (*) CLEAR    Specific Gravity, Urine 1.026  1.005 - 1.030    pH 5.5  5.0 - 8.0    Glucose, UA NEGATIVE  NEGATIVE mg/dL    Hgb urine dipstick LARGE (*) NEGATIVE    Bilirubin Urine NEGATIVE  NEGATIVE    Ketones, ur NEGATIVE  NEGATIVE mg/dL    Protein, ur 30 (*) NEGATIVE mg/dL    Urobilinogen, UA 0.2  0.0 - 1.0 mg/dL    Nitrite NEGATIVE  NEGATIVE    Leukocytes, UA SMALL (*) NEGATIVE   URINE MICROSCOPIC-ADD ON     Status: Abnormal   Collection Time   06/23/12  6:30 AM      Component Value Range Comment   Squamous Epithelial / LPF FEW (*) RARE    WBC, UA 7-10  <3 WBC/hpf    RBC / HPF 21-50  <3 RBC/hpf    Bacteria, UA FEW (*) RARE    Urine-Other AMORPHOUS URATES/PHOSPHATES     GLUCOSE, CAPILLARY     Status: Abnormal   Collection Time   06/23/12 11:51 AM      Component Value Range Comment   Glucose-Capillary 158 (*) 70 - 99 mg/dL   GLUCOSE, CAPILLARY     Status: Normal   Collection Time   06/23/12  5:10 PM      Component Value Range Comment   Glucose-Capillary 96  70 - 99 mg/dL   GLUCOSE, CAPILLARY     Status: Abnormal   Collection Time   06/23/12  9:24 PM      Component Value Range Comment   Glucose-Capillary 124 (*) 70 - 99 mg/dL   GLUCOSE, CAPILLARY     Status: Abnormal   Collection Time   06/24/12  6:16 AM      Component Value Range Comment   Glucose-Capillary 118 (*) 70 - 99 mg/dL   GLUCOSE, CAPILLARY  Status: Abnormal   Collection Time   06/24/12 11:47 AM      Component Value Range Comment   Glucose-Capillary 114 (*) 70 - 99 mg/dL      RISK REDUCTION FACTORS: What pt has learned from hospital stay is that she can come back here if she gets depressed again and that when she starts to isolate she needs to go get help for help for her depression.  Risk of self harm is elevated by her depression and her poor impulse control, but she has herself and for her traveling more to live for.  She also has her daughter to live for.  Risk of harm to others is minimal in that she has not been involved in fights or had any legal charges filed on her.  Pt seen in treatment team where she divulged the above information. The treatment team concluded that she was ready for discharge and had met her goals for an inpatient setting.   PLAN: Discharge home Continue Medication List  As of 06/24/2012  1:56 PM   STOP taking these medications         ciprofloxacin 250 MG tablet         TAKE these medications      Indication    citalopram 20 MG tablet   Commonly known as: CELEXA   Take 1 tablet (20 mg total) by mouth daily. For depression.       clonazePAM 0.5 MG tablet   Commonly known as: KLONOPIN   Take 1 tablet (0.5 mg total) by mouth 3 (three) times daily as needed. STOP TAKING, this causes DEPRESSION, prescribed for anxiety, but causes depression       donepezil 10 MG tablet   Commonly known as: ARICEPT   Take 1 tablet (10 mg total) by mouth at bedtime. For memeory       furosemide 40 MG tablet   Commonly known as: LASIX   Take 1 tablet (40 mg total) by mouth daily. For high blood pressure control and fluid build up control       metFORMIN 500 MG tablet   Commonly known as: GLUCOPHAGE   Take 1 tablet (500 mg total) by mouth 2 (two) times daily with a meal. For control of blood sugar       phenytoin 100 MG ER capsule   Commonly known as: DILANTIN   Take 2 capsules (200 mg total) by mouth 2 (two) times daily. For seizure control       phenytoin 50 MG tablet   Commonly known as: DILANTIN   Chew 1 tablet (50 mg total) by  mouth at bedtime. For seizure control       sitaGLIPtin 50 MG tablet   Commonly known as: JANUVIA   Take 1 tablet (50 mg total) by mouth daily. For control of blood sugar       traZODone 100 MG tablet   Commonly known as: DESYREL   Take 1 tablet (100 mg total) by mouth at bedtime. For insomnia.            Follow-up recommendations:  Activities: Resume typical activities Diet: Resume typical diet Other: Follow up with outpatient provider and report any side effects to out patient prescriber.  Plan: D/C today, pt has several things to get straightened out at home and has a plan to do so now.  Cognitive Features That Contribute To Risk:  Loss of executive function Thought constriction (tunnel vision)    Suicide Risk:  Minimal:  No identifiable suicidal ideation.  Patients presenting with no risk factors but with morbid ruminations; may be classified as minimal risk based on the severity of the depressive symptoms  Brandi Gomez 06/24/2012, 1:54 PM

## 2012-06-24 NOTE — Progress Notes (Signed)
[facility] Group Notes:  (Counselor/Nursing/MHT/Case Management/Adjunct)  06/24/2012 2:55 PM  Type of Therapy:  Group Therapy  Participation Level:  Active  Participation Quality:  Appropriate  Affect:  Appropriate  Cognitive:  Appropriate  Insight:  Good  Engagement in Group:  Good  Engagement in Therapy:  Good  Modes of Intervention:  Education and Support  Summary of Progress/Problems: The focus of this group was to discuss emotional regulation.  Counselor discussed three components to regulate your emotions to include: understanding your emotions, reduce emotional vulnerability, and letting go of suffering. Brandi Gomez was able to share an experience of feeling guilt associated with the loss of her mother.  Brandi Gomez also expressed her difficulties with letting go with her adult children who are out of the home due to her instincts of wanting to protect them from harmful life situations.    Brandi Gomez 06/24/2012, 2:55 PM

## 2012-06-24 NOTE — Progress Notes (Signed)
[facility] Adult Inpatient Family/Significant Other Suicide Prevention Education  Suicide Prevention Education:  SW met with Brandi Gomez consent on file).  SW reviewed suicide  Prevention education information.  SW reviewed risk factors/warning signs to look for.  SW reviewed resources available to contact in the event he had concerns for Pt. Safety.  Mr. Brandi Gomez endorsed there were no safety concerns as far as access to weapons, and he would hide kitchen knives from Pt if they present as a concern.  Brandi Gomez volunteered information that he would try to get the Pt. help with her electric bill.  Brandi Gomez also endorsed understanding of resources.  Clarice Pole, LCASA 06/24/2012, 2:50 PM

## 2012-06-26 NOTE — Progress Notes (Signed)
Patient Discharge Instructions:  After Visit Summary (AVS):   Faxed to:  06/25/2012 Psychiatric Admission Assessment Note:   Faxed to:  06/25/2012 Suicide Risk Assessment - Discharge Assessment:   Faxed to:  06/25/2012 Faxed/Sent to the Next Level Care provider:  06/25/2012  Faxed to [facility] of Care @ [phone]  Wandra Scot, 06/26/2012, 4:33 PM

## 2012-07-03 ENCOUNTER — Telehealth: Payer: Self-pay

## 2012-07-03 NOTE — Telephone Encounter (Signed)
Pt was recently released from behavioral health, they did labs when she was there and advised her to f-up with [place]. The discharge paperwork says her alkaline phospate was high and her red blood count was low. She already has an appt with Dr. Elbert Ewings on 8/29, pt wants to know if she can wait until then to address the labs or if she should come in earlier. [phone]

## 2012-07-04 NOTE — Telephone Encounter (Signed)
If possible she should come in sooner to see him in the walk-in clinic so he can recheck labs and discuss plan.

## 2012-07-05 NOTE — Telephone Encounter (Signed)
Left message for patient to call back  

## 2012-07-05 NOTE — Telephone Encounter (Signed)
Patient called back. She will come see Dr. Milus Glazier on Friday, 07/10/12.

## 2012-07-08 DIAGNOSIS — G40309 Generalized idiopathic epilepsy and epileptic syndromes, not intractable, without status epilepticus: Secondary | ICD-10-CM | POA: Diagnosis not present

## 2012-07-08 DIAGNOSIS — Z5181 Encounter for therapeutic drug level monitoring: Secondary | ICD-10-CM | POA: Diagnosis not present

## 2012-07-10 ENCOUNTER — Encounter (HOSPITAL_COMMUNITY): Payer: Self-pay | Admitting: *Deleted

## 2012-07-10 ENCOUNTER — Ambulatory Visit (INDEPENDENT_AMBULATORY_CARE_PROVIDER_SITE_OTHER): Payer: Medicare Other | Admitting: Family Medicine

## 2012-07-10 ENCOUNTER — Emergency Department (HOSPITAL_COMMUNITY)
Admission: EM | Admit: 2012-07-10 | Discharge: 2012-07-10 | Discharge status: Against Medical Advice | Payer: Medicare Other | Attending: Emergency Medicine | Admitting: Emergency Medicine

## 2012-07-10 VITALS — BP 130/90 | HR 75 | Temp 98.6°F | Resp 16 | Ht <= 58 in | Wt 233.4 lb

## 2012-07-10 DIAGNOSIS — F32A Depression, unspecified: Secondary | ICD-10-CM

## 2012-07-10 DIAGNOSIS — R0602 Shortness of breath: Secondary | ICD-10-CM

## 2012-07-10 DIAGNOSIS — F329 Major depressive disorder, single episode, unspecified: Secondary | ICD-10-CM

## 2012-07-10 DIAGNOSIS — F341 Dysthymic disorder: Secondary | ICD-10-CM | POA: Diagnosis not present

## 2012-07-10 DIAGNOSIS — E119 Type 2 diabetes mellitus without complications: Secondary | ICD-10-CM

## 2012-07-10 DIAGNOSIS — G4730 Sleep apnea, unspecified: Secondary | ICD-10-CM

## 2012-07-10 DIAGNOSIS — R569 Unspecified convulsions: Secondary | ICD-10-CM | POA: Diagnosis not present
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
## 2012-07-10 NOTE — ED Notes (Signed)
Called pa at urgent care.
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
## 2012-07-10 NOTE — ED Notes (Signed)
Pt spoke with social and choose to go home

## 2012-07-10 NOTE — ED Notes (Signed)
Pt and family were seen walking out of ED

## 2012-07-10 NOTE — ED Notes (Signed)
pts belongings were sent with spouse home

## 2012-07-11 ENCOUNTER — Ambulatory Visit (INDEPENDENT_AMBULATORY_CARE_PROVIDER_SITE_OTHER): Payer: Medicare Other | Admitting: Family Medicine

## 2012-07-11 ENCOUNTER — Other Ambulatory Visit: Payer: Self-pay | Admitting: Physician Assistant

## 2012-07-11 VITALS — BP 120/85 | HR 74 | Temp 98.1°F | Resp 16 | Ht <= 58 in | Wt 233.0 lb

## 2012-07-11 DIAGNOSIS — G40909 Epilepsy, unspecified, not intractable, without status epilepticus: Secondary | ICD-10-CM | POA: Diagnosis not present

## 2012-07-11 DIAGNOSIS — E119 Type 2 diabetes mellitus without complications: Secondary | ICD-10-CM | POA: Diagnosis not present

## 2012-07-11 DIAGNOSIS — E875 Hyperkalemia: Secondary | ICD-10-CM

## 2012-07-11 DIAGNOSIS — D649 Anemia, unspecified: Secondary | ICD-10-CM

## 2012-07-11 DIAGNOSIS — R109 Unspecified abdominal pain: Secondary | ICD-10-CM

## 2012-07-11 DIAGNOSIS — F319 Bipolar disorder, unspecified: Secondary | ICD-10-CM

## 2012-07-11 LAB — POCT URINALYSIS DIPSTICK
Blood, UA: NEGATIVE
Glucose, UA: NEGATIVE
Nitrite, UA: NEGATIVE
Protein, UA: NEGATIVE
Spec Grav, UA: 1.02
Urobilinogen, UA: 0.2
pH, UA: 5.5

## 2012-07-11 LAB — GLUCOSE, POCT (MANUAL RESULT ENTRY): POC Glucose: 114 mg/dl — AB (ref 70–99)

## 2012-07-11 LAB — COMPREHENSIVE METABOLIC PANEL
ALT: 27 U/L (ref 0–35)
AST: 19 U/L (ref 0–37)
Albumin: 3.6 g/dL (ref 3.5–5.2)
Alkaline Phosphatase: 138 U/L — ABNORMAL HIGH (ref 39–117)
BUN: 11 mg/dL (ref 6–23)
CO2: 32 mEq/L (ref 19–32)
Calcium: 8.7 mg/dL (ref 8.4–10.5)
Chloride: 100 mEq/L (ref 96–112)
Creat: 0.79 mg/dL (ref 0.50–1.10)
Glucose, Bld: 113 mg/dL — ABNORMAL HIGH (ref 70–99)
Potassium: 3.5 mEq/L (ref 3.5–5.3)
Sodium: 138 mEq/L (ref 135–145)
Total Bilirubin: 0.2 mg/dL — ABNORMAL LOW (ref 0.3–1.2)
Total Protein: 6.8 g/dL (ref 6.0–8.3)

## 2012-07-11 LAB — POCT CBC
Granulocyte percent: 48.2 %G (ref 37–80)
HCT, POC: 36.4 % — AB (ref 37.7–47.9)
Hemoglobin: 10.7 g/dL — AB (ref 12.2–16.2)
Lymph, poc: 2.8 (ref 0.6–3.4)
MCH, POC: 28.3 pg (ref 27–31.2)
MCHC: 29.4 g/dL — AB (ref 31.8–35.4)
MCV: 96.3 fL (ref 80–97)
MID (cbc): 0.4 (ref 0–0.9)
MPV: 9.2 fL (ref 0–99.8)
POC Granulocyte: 2.9 (ref 2–6.9)
POC LYMPH PERCENT: 45.6 %L (ref 10–50)
POC MID %: 6.2 %M (ref 0–12)
Platelet Count, POC: 268 10*3/uL (ref 142–424)
RBC: 3.78 M/uL — AB (ref 4.04–5.48)
RDW, POC: 14 %
WBC: 6.1 10*3/uL (ref 4.6–10.2)

## 2012-07-11 LAB — POCT GLYCOSYLATED HEMOGLOBIN (HGB A1C): Hemoglobin A1C: 6.5

## 2012-07-11 LAB — PHENYTOIN LEVEL, TOTAL: Phenytoin Lvl: 8.9 ug/mL — ABNORMAL LOW (ref 10.0–20.0)

## 2012-07-11 MED ORDER — FERROUS GLUCONATE 324 (38 FE) MG PO TABS: 324.0000 mg | ORAL_TABLET | Freq: Every day | ORAL | Status: DC

## 2012-07-11 MED ORDER — CIPROFLOXACIN HCL 250 MG PO TABS: 250.0000 mg | ORAL_TABLET | Freq: Two times a day (BID) | ORAL | Status: AC

## 2012-07-11 NOTE — Telephone Encounter (Signed)
Pt would like a rx for iron tablets so they are cheaper for her.

## 2012-07-11 NOTE — Patient Instructions (Signed)
Return on Tuesday at noon.  Start taking iron tablets daily.

## 2012-07-11 NOTE — Progress Notes (Signed)
Author:  Ophelia Shoulder, RN  Service:  CASE MANAGEMENT  Author Type:  Case Manager   Filed:  07/10/12 1922  Note Time:  07/10/12 1845          [facility] ED CM was contacted by ED RN, Morrie Sheldon for pt who came from urgent care with request for admission because she is on CPAP (sleep apnea PMH) and her Power is out at her home. CM unable to review EPIC but went to speak with the pt, her daughter and a female family member who confirms pt was seen by [facility] urgent care 07/10/12 for labs and assessment. Per pt labs are pending. CM assessed home situation, possible care givers and her present treatment plan for sleep apnea. The daughter and female family member stated they live with someone other than the pt and when asked stated pt could not stay with them. Pt informed CM she was bipolar. Pt reports being compliant with her BH, seizure medications and "other medicines" Report taking Dilantin last on 07/09/12 pm She has not taken medicine today because she reports being "busy with seeing doctor". CM discussed patient responsibility (not the ED) for her present treatment plan, housing, electricity concerns. Discussed the difference in community and hospital level care. Cm discussed with pt that the CM could not explain to her why urgent care sent her to [facility] ED to bed admitted because she does not have electricity. Pt increased her voice level after CM empathase with pt about her electricity being turned off and her not being able to use her CPAP machine (CM shared with pt that CM is familiar with CPAP because of CM family member's). CM reviewed s/s for pt to watch for related to Oxygen saturation level. 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 Pt refused SW services for possible shelter because people there have diseases" Pt refused for Cm to check her O2 Saturation. Pt  speaking with Cm for 15-20 minutes into the assessment without noted sob, color changes, etc. Pt reports not having a seizure in a long time Initially pt daughter wanted pt to be evaluated by EDP for seizure but pt refused because she had labs at pcp office, states her saturation would be "ok" and did not want to wait for ED evaluation after Cm said time frame may be 2-3+ hours depending on other triage patients. Daughter, pt and female all agreed on pt waiting on pcp labs report on Monday. Pt and family left ED RN updated       Above note reviewed with patient.  She disputes the note, saying that she has been having seizures.   Patient says her oxygen level was checked and it was "fine." Her partner accompanying her today says she was having apneic spells and labored breathing overnight.  Objective:  NAD Alert HEENT: no abnormalities noted Neck supple without adenopathy Chest:  Clear Heart:  Reg, no murmur Abdomen:  Soft, nontender, obese Ext: no edema, good pedal pulses Skin:  No rashes. Results for orders placed in visit on 07/11/12  POCT URINALYSIS DIPSTICK      Component Value Range   Color, UA yellow     Clarity, UA clear     Glucose, UA neg     Bilirubin, UA eng     Ketones, UA eng     Spec Grav, UA 1.020  Blood, UA neg     pH, UA 5.5     Protein, UA neg     Urobilinogen, UA 0.2     Nitrite, UA neg     Leukocytes, UA Trace    POCT CBC      Component Value Range   WBC 6.1  4.6 - 10.2 K/uL   Lymph, poc 2.8  0.6 - 3.4   POC LYMPH PERCENT 45.6  10 - 50 %L   MID (cbc) 0.4  0 - 0.9   POC MID % 6.2  0 - 12 %M   POC Granulocyte 2.9  2 - 6.9   Granulocyte percent 48.2  37 - 80 %G   RBC 3.78 (*) 4.04 - 5.48 M/uL   Hemoglobin 10.7 (*) 12.2 - 16.2 g/dL   HCT, POC 16.1 (*) 09.6 - 47.9 %   MCV 96.3  80 - 97 fL   MCH, POC 28.3  27 - 31.2 pg   MCHC 29.4 (*) 31.8 - 35.4 g/dL   RDW, POC 04.5     Platelet Count, POC 268  142 - 424 K/uL   MPV 9.2  0 - 99.8 fL  GLUCOSE, POCT  (MANUAL RESULT ENTRY)      Component Value Range   POC Glucose 114 (*) 70 - 99 mg/dl  POCT GLYCOSYLATED HEMOGLOBIN (HGB A1C)      Component Value Range   Hemoglobin A1C 6.5       Assessment:  Patient clearly has a significant problem in not being able to use her CPAP machine.  I tried to access the available sources that I know of and was unsuccessful.  The emergency department was unable to offer her the assistance she requires to maintain good oxygenation at night.  I spent some time trying to figure out what we can do to prevent sudden death from sleep apnea.  The anemia appears to be worsening.  We will need to follow up closely on Tuesday.  Patient to take iron  1. Seizure disorder  POCT urinalysis dipstick, POCT CBC, Comprehensive metabolic panel  2. Diabetes type 2, controlled  POCT urinalysis dipstick, Comprehensive metabolic panel, POCT glucose (manual entry), POCT glycosylated hemoglobin (Hb A1C), Phenytoin level, total  3. Bipolar 1 disorder  POCT CBC  4. Abdominal pain  ciprofloxacin (CIPRO) 250 MG tablet, Urine culture  5. Anemia

## 2012-07-12 LAB — URINE CULTURE: Colony Count: 15000

## 2012-07-13 ENCOUNTER — Telehealth: Payer: Self-pay

## 2012-07-13 NOTE — Telephone Encounter (Signed)
Looks like this is related to recent behavioral health admission, were you expecting this? Let me know if anything further is neeeded

## 2012-07-13 NOTE — Telephone Encounter (Signed)
Jodean Lima will be the [facility] mgmt case worker for patient.  She can be contacted at [phone].

## 2012-07-25 ENCOUNTER — Other Ambulatory Visit: Payer: Self-pay | Admitting: Physician Assistant

## 2012-08-06 ENCOUNTER — Encounter: Payer: Self-pay | Admitting: Family Medicine

## 2012-08-06 ENCOUNTER — Ambulatory Visit (INDEPENDENT_AMBULATORY_CARE_PROVIDER_SITE_OTHER): Payer: Medicare Other | Admitting: Family Medicine

## 2012-08-06 VITALS — BP 128/82 | HR 69 | Temp 98.9°F | Resp 16 | Ht 60.0 in | Wt 289.0 lb

## 2012-08-06 DIAGNOSIS — L710 Perioral dermatitis: Secondary | ICD-10-CM

## 2012-08-06 DIAGNOSIS — D649 Anemia, unspecified: Secondary | ICD-10-CM | POA: Diagnosis not present

## 2012-08-06 DIAGNOSIS — M7989 Other specified soft tissue disorders: Secondary | ICD-10-CM | POA: Diagnosis not present

## 2012-08-06 DIAGNOSIS — R252 Cramp and spasm: Secondary | ICD-10-CM

## 2012-08-06 DIAGNOSIS — E119 Type 2 diabetes mellitus without complications: Secondary | ICD-10-CM | POA: Diagnosis not present

## 2012-08-06 DIAGNOSIS — L719 Rosacea, unspecified: Secondary | ICD-10-CM

## 2012-08-06 DIAGNOSIS — G4733 Obstructive sleep apnea (adult) (pediatric): Secondary | ICD-10-CM | POA: Diagnosis not present

## 2012-08-06 LAB — FERRITIN: Ferritin: 33 ng/mL (ref 10–291)

## 2012-08-06 MED ORDER — METRONIDAZOLE 1 % EX GEL: Freq: Every day | CUTANEOUS | Status: DC

## 2012-08-06 MED ORDER — MAGNESIUM OXIDE 400 MG PO TABS: 400.0000 mg | ORAL_TABLET | Freq: Every day | ORAL | Status: DC

## 2012-08-06 NOTE — Progress Notes (Signed)
41 yo impoverished woman with diabetes.  Still no power at home and cannot afford payment to [facility] [employer] for CPAP.  Complains of facial rash and cramps in feet for past month.  Not any worse recently.  Objective:  NAD Neck:  Supple Chest: clear Heart: reg, no murmur Extremities:  Normal pedal pulses, no edema Skin:  Acneiform bumps on chin Results for orders placed in visit on 07/11/12  POCT URINALYSIS DIPSTICK      Component Value Range   Color, UA yellow     Clarity, UA clear     Glucose, UA neg     Bilirubin, UA eng     Ketones, UA eng     Spec Grav, UA 1.020     Blood, UA neg     pH, UA 5.5     Protein, UA neg     Urobilinogen, UA 0.2     Nitrite, UA neg     Leukocytes, UA Trace    POCT CBC      Component Value Range   WBC 6.1  4.6 - 10.2 K/uL   Lymph, poc 2.8  0.6 - 3.4   POC LYMPH PERCENT 45.6  10 - 50 %L   MID (cbc) 0.4  0 - 0.9   POC MID % 6.2  0 - 12 %M   POC Granulocyte 2.9  2 - 6.9   Granulocyte percent 48.2  37 - 80 %G   RBC 3.78 (*) 4.04 - 5.48 M/uL   Hemoglobin 10.7 (*) 12.2 - 16.2 g/dL   HCT, POC 16.1 (*) 09.6 - 47.9 %   MCV 96.3  80 - 97 fL   MCH, POC 28.3  27 - 31.2 pg   MCHC 29.4 (*) 31.8 - 35.4 g/dL   RDW, POC 04.5     Platelet Count, POC 268  142 - 424 K/uL   MPV 9.2  0 - 99.8 fL  COMPREHENSIVE METABOLIC PANEL      Component Value Range   Sodium 138  135 - 145 mEq/L   Potassium 3.5  3.5 - 5.3 mEq/L   Chloride 100  96 - 112 mEq/L   CO2 32  19 - 32 mEq/L   Glucose, Bld 113 (*) 70 - 99 mg/dL   BUN 11  6 - 23 mg/dL   Creat 4.09  8.11 - 9.14 mg/dL   Total Bilirubin 0.2 (*) 0.3 - 1.2 mg/dL   Alkaline Phosphatase 138 (*) 39 - 117 U/L   AST 19  0 - 37 U/L   ALT 27  0 - 35 U/L   Total Protein 6.8  6.0 - 8.3 g/dL   Albumin 3.6  3.5 - 5.2 g/dL   Calcium 8.7  8.4 - 78.2 mg/dL  GLUCOSE, POCT (MANUAL RESULT ENTRY)      Component Value Range   POC Glucose 114 (*) 70 - 99 mg/dl  POCT GLYCOSYLATED HEMOGLOBIN (HGB A1C)      Component Value Range   Hemoglobin A1C 6.5    PHENYTOIN LEVEL, TOTAL      Component Value Range   Phenytoin Lvl 8.9 (*) 10.0 - 20.0 ug/mL  URINE CULTURE      Component Value Range   Colony Count 15,000 COLONIES/ML     Organism ID, Bacteria Multiple bacterial morphotypes present, none     Organism ID, Bacteria predominant. Suggest appropriate recollection if      Organism ID, Bacteria clinically indicated.       Assessment:  Acneiform  rash c/w perioral dermatitis and muscle cramps probably related to Lasix  Plan. 1. Perioral dermatitis  metroNIDAZOLE (METROGEL) 1 % gel  2. Muscle cramps  magnesium oxide (MAG-OX 400) 400 MG tablet  3. Type 2 diabetes mellitus    4. OSA on CPAP

## 2012-08-07 ENCOUNTER — Encounter: Payer: Medicare Other | Admitting: Physician Assistant

## 2012-08-08 ENCOUNTER — Ambulatory Visit (INDEPENDENT_AMBULATORY_CARE_PROVIDER_SITE_OTHER): Payer: Medicare Other | Admitting: Family Medicine

## 2012-08-08 VITALS — BP 120/82 | HR 57 | Temp 98.1°F | Resp 18 | Ht 61.0 in | Wt 191.4 lb

## 2012-08-08 DIAGNOSIS — R109 Unspecified abdominal pain: Secondary | ICD-10-CM | POA: Diagnosis not present

## 2012-08-08 MED ORDER — TRAZODONE HCL 100 MG PO TABS: 100.0000 mg | ORAL_TABLET | Freq: Every day | ORAL | Status: DC

## 2012-08-08 MED ORDER — METRONIDAZOLE 250 MG PO TABS: 250.0000 mg | ORAL_TABLET | Freq: Two times a day (BID) | ORAL | Status: AC

## 2012-08-08 NOTE — Progress Notes (Signed)
  Subjective:    Patient ID: Brandi Gomez, female    DOB: [birth year], 41 y.o.   MRN: [id number]  HPI Patient presents with complaints of epigastric pain and frequent loose stools. She has no complaints of any vomiting. Also she request renewal of her trazadone she takes this at night for sleep. Patient also has a difficult social situation recently her Electricity has been discontinued by [employer]. She has had financial difficulty including this situation. Patient would also like to know if the Topamax could be causing her this GI upset, recently her Dr Anne Hahn has advised her to begin this again. She is advised to discontinue this medication  Review of Systems     Objective:   Physical Exam Obese female with epigastric pain.  No HSM or masses.  Pain reproduced with deep palpation in epigastrium       Assessment & Plan:  Flagyl 250 bid x 7days Trazodone 100mg  for sleep. Discontinue the Topamax.  Recheck 72 hours

## 2012-08-11 ENCOUNTER — Telehealth: Payer: Self-pay

## 2012-08-11 NOTE — Telephone Encounter (Signed)
Pt had sent Dr L a letter concerning her power being cut off and [employer] and [employer] stating that she needs a minimum of $600 to get it cut back on. Called [employer] and ultimately spoke w/Energy Protection dept notifying them that pt is on medical equipment that she needs power to run at Dr [employer] request. I was told that there had been some evidence of meter tampering and that they require the minimum payment they D/W pt, but if she paid that, we could write a letter to have power cut back on the same day. They stated that if pt can not make the minimum payment "she will have to make other arrangements". I called pt and gave her this info and advised her to call if she needs [place] to write a letter if she is able to make the payment. Pt agreed

## 2012-09-20 ENCOUNTER — Emergency Department (HOSPITAL_COMMUNITY): Payer: Medicare Other

## 2012-09-20 ENCOUNTER — Encounter (HOSPITAL_COMMUNITY): Payer: Self-pay | Admitting: Emergency Medicine

## 2012-09-20 ENCOUNTER — Emergency Department (HOSPITAL_COMMUNITY)
Admission: EM | Admit: 2012-09-20 | Discharge: 2012-09-21 | Disposition: A | Discharge status: Home / Self Care | Payer: Medicare Other | Attending: Emergency Medicine | Admitting: Emergency Medicine

## 2012-09-20 DIAGNOSIS — R42 Dizziness and giddiness: Secondary | ICD-10-CM | POA: Diagnosis not present

## 2012-09-20 DIAGNOSIS — R569 Unspecified convulsions: Secondary | ICD-10-CM | POA: Diagnosis not present

## 2012-09-20 DIAGNOSIS — Z79899 Other long term (current) drug therapy: Secondary | ICD-10-CM | POA: Diagnosis not present

## 2012-09-20 DIAGNOSIS — G40909 Epilepsy, unspecified, not intractable, without status epilepticus: Secondary | ICD-10-CM | POA: Diagnosis not present

## 2012-09-20 DIAGNOSIS — E119 Type 2 diabetes mellitus without complications: Secondary | ICD-10-CM | POA: Insufficient documentation

## 2012-09-20 DIAGNOSIS — I10 Essential (primary) hypertension: Secondary | ICD-10-CM | POA: Diagnosis not present

## 2012-09-20 DIAGNOSIS — R110 Nausea: Secondary | ICD-10-CM | POA: Diagnosis not present

## 2012-09-20 LAB — COMPREHENSIVE METABOLIC PANEL

## 2012-09-20 LAB — URINALYSIS, ROUTINE W REFLEX MICROSCOPIC
Ketones, ur: NEGATIVE mg/dL
Nitrite: NEGATIVE
Specific Gravity, Urine: 1.021 (ref 1.005–1.030)
pH: 7 (ref 5.0–8.0)

## 2012-09-20 LAB — BASIC METABOLIC PANEL
BUN: 8 mg/dL (ref 6–23)
CO2: 30 mEq/L (ref 19–32)
Calcium: 9 mg/dL (ref 8.4–10.5)
Glucose, Bld: 91 mg/dL (ref 70–99)
Sodium: 135 mEq/L (ref 135–145)

## 2012-09-20 LAB — URINE MICROSCOPIC-ADD ON

## 2012-09-20 LAB — CBC
HCT: 37.5 % (ref 36.0–46.0)
Hemoglobin: 12.2 g/dL (ref 12.0–15.0)
MCH: 31.7 pg (ref 26.0–34.0)
MCHC: 34.1 g/dL (ref 30.0–36.0)
RBC: 4.07 MIL/uL (ref 3.87–5.11)
WBC: 6.3 10*3/uL (ref 4.0–10.5)
WBC: 6.9 10*3/uL (ref 4.0–10.5)

## 2012-09-20 LAB — PHENYTOIN LEVEL, TOTAL: Phenytoin Lvl: 10.4 ug/mL (ref 10.0–20.0)

## 2012-09-20 LAB — GLUCOSE, CAPILLARY: Glucose-Capillary: 139 mg/dL — ABNORMAL HIGH (ref 70–99)

## 2012-09-20 MED ORDER — MECLIZINE HCL 25 MG PO TABS
25.0000 mg | ORAL_TABLET | Freq: Once | ORAL | Status: AC
  Administered 2012-09-20: 25 mg via ORAL
  Filled 2012-09-20: qty 1

## 2012-09-20 MED ORDER — SODIUM CHLORIDE 0.9 % IV SOLN
500.0000 mg | Freq: Once | INTRAVENOUS | Status: AC
  Administered 2012-09-20: 500 mg via INTRAVENOUS
  Filled 2012-09-20: qty 10

## 2012-09-20 MED ORDER — SODIUM CHLORIDE 0.9 % IV BOLUS (SEPSIS)
1000.0000 mL | Freq: Once | INTRAVENOUS | Status: AC
  Administered 2012-09-20: 1000 mL via INTRAVENOUS

## 2012-09-20 MED ORDER — MECLIZINE HCL 25 MG PO TABS: 50.0000 mg | ORAL_TABLET | Freq: Three times a day (TID) | ORAL | Status: DC | PRN

## 2012-09-20 MED ORDER — ONDANSETRON HCL 4 MG/2ML IJ SOLN: 4.0000 mg | Freq: Once | INTRAMUSCULAR | Status: DC

## 2012-09-20 MED ORDER — IBUPROFEN 800 MG PO TABS
800.0000 mg | ORAL_TABLET | Freq: Once | ORAL | Status: AC
  Administered 2012-09-20: 800 mg via ORAL
  Filled 2012-09-20: qty 1

## 2012-09-20 NOTE — ED Notes (Signed)
cbg 88 

## 2012-09-20 NOTE — ED Notes (Signed)
Discussed plan of care with the PA specially the pain in pt's hips, CBG, and blood work. PA is allowing the pt to eat.

## 2012-09-20 NOTE — ED Notes (Addendum)
Pt presenting to ed with c/o dizziness. Pt states it feel like the room is spinning when she closes her eyes. Pt states positive nausea and vomiting. Pt denies chest pain at this time. Pt also states that she has a foul urine smell. Pt denies any dysuria or burning with urination at this time

## 2012-09-20 NOTE — ED Notes (Signed)
Pt reports throbbing pain down the hips and her upper thigh bilaterally.  Pt reports its feels "achy" as well.

## 2012-09-20 NOTE — ED Provider Notes (Signed)
History     CSN: [id number]  Arrival date & time 09/20/12  1731   First MD Initiated Contact with Patient 09/20/12 1858      Chief Complaint  Patient presents with  . Dizziness  . Nausea    (Consider location/radiation/quality/duration/timing/severity/associated sxs/prior treatment) HPI Comments: Patient with history of seizures presents with chief complaint of dizziness. She had a witnessed seizure around 3 am this morning, lasting about 5 minutes and described as left sided upper body jerks. No bladder incontinence. Patient took 2 chewable tablets of phenytoin after the seizure and fell back asleep. Dizziness began when she woke up later that morning. Dizziness (room spinning) occurs when closing eyes or walking & occasionally causes her to feel "off balance". Symptom improved sitting still. It has been worsening throughout the day and is associated with nausea and vomiting. She took her normal dose of phenytoin this morning but vomited up the pills and has not taken any since. Emesis was non-bloody. Denies fever, chills, night sweats, chest pain, SOB, diplopia, neck pain, or headache. She has had intermittent blurry vision throughout the day. She has had loose stools for the past few months with no gross blood. She has not had her phenytoin blood level checked in many months. Patient has sleep apnea but has been unable to use CPAP machine for past 3 months due to no electricity in the home.  The history is provided by the patient and the spouse.    Past Medical History  Diagnosis Date  . Type II or unspecified type diabetes mellitus without mention of complication, not stated as uncontrolled   . Hypertension   . Seizures   . Sleep apnea   . Anxiety   . Depression   . Bipolar 1 disorder     Past Surgical History  Procedure Date  . Nasal sinus surgery     Family History  Problem Relation Age of Onset  . Diabetes Mother     History  Substance Use Topics  . Smoking status:  Never Smoker   . Smokeless tobacco: Never Used  . Alcohol Use: No    OB History    Grav Para Term Preterm Abortions TAB SAB Ect Mult Living                  Review of Systems  Constitutional: Negative for fever and chills.  HENT: Negative for ear pain, trouble swallowing, neck pain, neck stiffness and tinnitus.   Eyes:       Blurry vision.  Cardiovascular: Negative for chest pain.  Gastrointestinal: Positive for nausea and vomiting. Negative for diarrhea.  Genitourinary: Negative for dysuria.  Neurological: Positive for dizziness and seizures. Negative for numbness and headaches.    Allergies  Hydrocodone  Home Medications   Current Outpatient Rx  Name Route Sig Dispense Refill  . CITALOPRAM HYDROBROMIDE 20 MG PO TABS Oral Take 2 tablets (40 mg total) by mouth daily. For depression. 60 tablet 0  . DONEPEZIL HCL 10 MG PO TABS Oral Take 1 tablet (10 mg total) by mouth at bedtime. For memeory 30 tablet 0  . FERROUS GLUCONATE 324 (38 FE) MG PO TABS Oral Take 324 mg by mouth daily with breakfast.    . FUROSEMIDE 40 MG PO TABS Oral Take 1 tablet (40 mg total) by mouth daily. For high blood pressure control and fluid build up control 30 tablet 0  . MAGNESIUM OXIDE 400 MG PO TABS Oral Take 1 tablet (400 mg total) by mouth  daily. 30 tablet 1  . METFORMIN HCL 500 MG PO TABS Oral Take 1 tablet (500 mg total) by mouth 2 (two) times daily with a meal. For control of blood sugar 60 tablet 0  . PHENYTOIN 50 MG PO CHEW Oral Chew 1 tablet (50 mg total) by mouth at bedtime. For seizure control 30 tablet 0  . PHENYTOIN SODIUM EXTENDED 100 MG PO CAPS Oral Take 2 capsules (200 mg total) by mouth 2 (two) times daily. For seizure control 120 capsule 0  . SITAGLIPTIN PHOSPHATE 50 MG PO TABS Oral Take 1 tablet (50 mg total) by mouth daily. For control of blood sugar 30 tablet 0  . TRAZODONE HCL 100 MG PO TABS Oral Take 1 tablet (100 mg total) by mouth at bedtime. For insomnia. 30 tablet 0  . TRAZODONE  HCL 100 MG PO TABS Oral Take 1 tablet (100 mg total) by mouth at bedtime. 30 tablet 5    BP 142/82  Pulse 72  Temp 98.3 F (36.8 C) (Oral)  SpO2 98%  LMP 09/13/2012  Physical Exam  Constitutional: She appears well-developed and well-nourished.  HENT:  Head: Normocephalic and atraumatic.  Eyes:       Pupillary reaction to light difficult to assess because pupil size 1mm bilaterally. Nystagmus noted with extreme horizontal gaze.  Neck: Normal range of motion. Neck supple.  Cardiovascular: Normal rate, regular rhythm and normal heart sounds.   Pulmonary/Chest: Effort normal and breath sounds normal.  Abdominal: Soft. Bowel sounds are normal. There is no tenderness.  Neurological:       CN V, VIII-XII intact.  Unable to perform rapid alternating movements but able to perform finger to nose test. No past pointing. Gait slowed, but without ataxia    ED Course  Procedures (including critical care time)  Labs Reviewed  URINALYSIS, ROUTINE W REFLEX MICROSCOPIC - Abnormal; Notable for the following:    APPearance CLOUDY (*)     Leukocytes, UA SMALL (*)     All other components within normal limits  URINE MICROSCOPIC-ADD ON - Abnormal; Notable for the following:    Squamous Epithelial / LPF MANY (*)     All other components within normal limits  POCT PREGNANCY, URINE  GLUCOSE, CAPILLARY  COMPREHENSIVE METABOLIC PANEL  CBC   No results found.   No diagnosis found.  BP 121/56  Pulse 67  Temp 98.2 F (36.8 C) (Oral)  Resp 22  SpO2 96%  LMP 09/13/2012   MDM  Patient is at mental baseline.  Labs and imaging have been reviewed.  Patient is advised to followup with neurologist in regards to today's event (she will call Dr. Anne Hahn tmw am).  Spoke with patient and family in detail about driving restrictions until cleared by a neurologist.  Patient verbalizes understanding.  Answered all questions.  Patient is hemodynamically stable and in no acute distress prior to discharge.  Medications given as below. Pt seen w Dr. Ethelda Chick who agrees with my plan to dc.      . ibuprofen  800 mg Oral Once  . meclizine  25 mg Oral Once  . ondansetron (ZOFRAN) IV  4 mg Intravenous Once  . phenytoin (DILANTIN) IV  500 mg Intravenous Once  . sodium chloride  1,000 mL Intravenous Once              [employer], PA-C 09/20/12 2249

## 2012-09-20 NOTE — ED Provider Notes (Signed)
Patient had seizure today at 3 AM. She denies noncompliance with medications. She presently feels well except for dizziness i.e. sensation of room spinning which is caused by change of position improved with remaining still she denies visual changes there are difficulty speaking . Nausea with dizziness no other complaint . Exam patient is alert Glasgow Coma Score 15 HEENT exam normal cephalic atraumatic extremities moist no facial asymmetry bilateral tympanic membranes normal neck is supple neurologic Glasgow Coma Score 15 cranial nerves II through XII grossly intact gait is normal finger to nose normal DTRs symmetric bilaterally knee jerk ankle jerk and biceps toes downgoing bilaterally VertigoIs felt to be peripheral in etiology  Doug Sou, MD 09/20/12 2235

## 2012-09-21 ENCOUNTER — Ambulatory Visit (INDEPENDENT_AMBULATORY_CARE_PROVIDER_SITE_OTHER): Payer: Medicare Other | Admitting: Family Medicine

## 2012-09-21 VITALS — BP 112/84 | HR 60 | Temp 98.4°F | Resp 16 | Ht 60.5 in | Wt 268.0 lb

## 2012-09-21 DIAGNOSIS — G4733 Obstructive sleep apnea (adult) (pediatric): Secondary | ICD-10-CM

## 2012-09-21 DIAGNOSIS — R42 Dizziness and giddiness: Secondary | ICD-10-CM

## 2012-09-21 DIAGNOSIS — E119 Type 2 diabetes mellitus without complications: Secondary | ICD-10-CM

## 2012-09-21 DIAGNOSIS — E1169 Type 2 diabetes mellitus with other specified complication: Secondary | ICD-10-CM

## 2012-09-21 DIAGNOSIS — G40909 Epilepsy, unspecified, not intractable, without status epilepticus: Secondary | ICD-10-CM | POA: Diagnosis not present

## 2012-09-21 MED ORDER — SITAGLIPTIN PHOSPHATE 50 MG PO TABS: 50.0000 mg | ORAL_TABLET | Freq: Every day | ORAL | Status: DC

## 2012-09-21 NOTE — Progress Notes (Signed)
41 yo with OSA who had seizure on early Sunday morning.  She thinks it comes from not having her CPAP machine working because [employer] shut off the power.  She had a CAT scan because of seizure and vertigo, and she stayed in ED for 9 hours before being released.  Today, she is complaining of hip pain, leg pain.  Did not bite tongue. Feels blurry vision.  Objective:  NAD.  Appears dejected.  Seen with boyfriend.  Appears disheveled, seen in wheelchair. HEENT:  Normal fundi, EOM, TM's, oroph Chest:  Clear Heart:  Reg, no murmur. Ext:  No edema Morbidly obese  Results for orders placed during the hospital encounter of 09/20/12  URINALYSIS, ROUTINE W REFLEX MICROSCOPIC      Component Value Range   Color, Urine YELLOW  YELLOW   APPearance CLOUDY (*) CLEAR   Specific Gravity, Urine 1.021  1.005 - 1.030   pH 7.0  5.0 - 8.0   Glucose, UA NEGATIVE  NEGATIVE mg/dL   Hgb urine dipstick NEGATIVE  NEGATIVE   Bilirubin Urine NEGATIVE  NEGATIVE   Ketones, ur NEGATIVE  NEGATIVE mg/dL   Protein, ur NEGATIVE  NEGATIVE mg/dL   Urobilinogen, UA 0.2  0.0 - 1.0 mg/dL   Nitrite NEGATIVE  NEGATIVE   Leukocytes, UA SMALL (*) NEGATIVE  POCT PREGNANCY, URINE      Component Value Range   Preg Test, Ur NEGATIVE  NEGATIVE  GLUCOSE, CAPILLARY      Component Value Range   Glucose-Capillary 88  70 - 99 mg/dL  COMPREHENSIVE METABOLIC PANEL      Component Value Range   Sodium HEMOLYZED SPECIMEN - SUGGEST RECOLLECT  135 - 145 mEq/L   Potassium HEMOLYZED SPECIMEN - SUGGEST RECOLLECT  3.5 - 5.1 mEq/L   Chloride HEMOLYZED SPECIMEN - SUGGEST RECOLLECT  96 - 112 mEq/L   CO2 HEMOLYZED SPECIMEN - SUGGEST RECOLLECT  19 - 32 mEq/L   Glucose, Bld HEMOLYZED SPECIMEN - SUGGEST RECOLLECT  70 - 99 mg/dL   BUN HEMOLYZED SPECIMEN - SUGGEST RECOLLECT  6 - 23 mg/dL   Creatinine, Ser HEMOLYZED SPECIMEN - SUGGEST RECOLLECT  0.50 - 1.10 mg/dL   Calcium HEMOLYZED SPECIMEN - SUGGEST RECOLLECT  8.4 - 10.5 mg/dL   Total Protein  HEMOLYZED SPECIMEN - SUGGEST RECOLLECT  6.0 - 8.3 g/dL   Albumin HEMOLYZED SPECIMEN - SUGGEST RECOLLECT  3.5 - 5.2 g/dL   AST HEMOLYZED SPECIMEN - SUGGEST RECOLLECT  0 - 37 U/L   ALT HEMOLYZED SPECIMEN - SUGGEST RECOLLECT  0 - 35 U/L   Alkaline Phosphatase HEMOLYZED SPECIMEN - SUGGEST RECOLLECT  39 - 117 U/L   Total Bilirubin HEMOLYZED SPECIMEN - SUGGEST RECOLLECT  0.3 - 1.2 mg/dL   GFR calc non Af Amer HEMOLYZED SPECIMEN - SUGGEST RECOLLECT  >90 mL/min   GFR calc Af Amer HEMOLYZED SPECIMEN - SUGGEST RECOLLECT  >90 mL/min  CBC      Component Value Range   WBC 6.3  4.0 - 10.5 K/uL   RBC 4.07  3.87 - 5.11 MIL/uL   Hemoglobin 12.2  12.0 - 15.0 g/dL   HCT 16.1  09.6 - 04.5 %   MCV 92.1  78.0 - 100.0 fL   MCH 30.0  26.0 - 34.0 pg   MCHC 32.5  30.0 - 36.0 g/dL   RDW 40.9  81.1 - 91.4 %   Platelets 279  150 - 400 K/uL  URINE MICROSCOPIC-ADD ON      Component Value Range  Squamous Epithelial / LPF MANY (*) RARE   WBC, UA 3-6  <3 WBC/hpf   Bacteria, UA RARE  RARE   Urine-Other MUCOUS PRESENT    PHENYTOIN LEVEL, TOTAL      Component Value Range   Phenytoin Lvl 10.4  10.0 - 20.0 ug/mL  CBC      Component Value Range   WBC 6.9  4.0 - 10.5 K/uL   RBC 3.98  3.87 - 5.11 MIL/uL   Hemoglobin 12.6  12.0 - 15.0 g/dL   HCT 78.2  95.6 - 21.3 %   MCV 92.7  78.0 - 100.0 fL   MCH 31.7  26.0 - 34.0 pg   MCHC 34.1  30.0 - 36.0 g/dL   RDW 08.6  57.8 - 46.9 %   Platelets 256  150 - 400 K/uL  BASIC METABOLIC PANEL      Component Value Range   Sodium 135  135 - 145 mEq/L   Potassium 4.2  3.5 - 5.1 mEq/L   Chloride 97  96 - 112 mEq/L   CO2 30  19 - 32 mEq/L   Glucose, Bld 91  70 - 99 mg/dL   BUN 8  6 - 23 mg/dL   Creatinine, Ser 6.29  0.50 - 1.10 mg/dL   Calcium 9.0  8.4 - 52.8 mg/dL   GFR calc non Af Amer >90  >90 mL/min   GFR calc Af Amer >90  >90 mL/min  GLUCOSE, CAPILLARY      Component Value Range   Glucose-Capillary 86  70 - 99 mg/dL  GLUCOSE, CAPILLARY      Component Value Range    Glucose-Capillary 139 (*) 70 - 99 mg/dL   Comment 1 Notify RN     Assessment:  Very difficult social situation.  Patient cannot afford med's.  She does not appear acutely ill or showing ill effects of recent seizure. I feel bad for patient:  She cannot afford CPAP machine or electricity.  Plan:   Patient reassured.  She will follow up with Dr. Anne Hahn

## 2012-09-21 NOTE — ED Provider Notes (Signed)
Medical screening examination/treatment/procedure(s) were conducted as a shared visit with non-physician practitioner(s) and myself.  I personally evaluated the patient during the encounter  Doug Sou, MD 09/21/12 [phone]

## 2012-09-24 ENCOUNTER — Other Ambulatory Visit: Payer: Self-pay | Admitting: Family Medicine

## 2012-09-24 DIAGNOSIS — D649 Anemia, unspecified: Secondary | ICD-10-CM

## 2012-09-24 MED ORDER — FERROUS SULFATE 325 (65 FE) MG PO TBEC: 325.0000 mg | DELAYED_RELEASE_TABLET | Freq: Three times a day (TID) | ORAL | Status: DC

## 2012-09-25 ENCOUNTER — Telehealth: Payer: Self-pay

## 2012-09-25 NOTE — Telephone Encounter (Signed)
Pt requests refill on diabetic testing supplies  [facility]  Best: [phone]  bf

## 2012-09-26 ENCOUNTER — Ambulatory Visit (INDEPENDENT_AMBULATORY_CARE_PROVIDER_SITE_OTHER): Payer: Medicare Other | Admitting: Family Medicine

## 2012-09-26 VITALS — BP 127/86 | HR 78 | Temp 98.2°F | Resp 17 | Ht 60.5 in | Wt 291.0 lb

## 2012-09-26 DIAGNOSIS — IMO0001 Reserved for inherently not codable concepts without codable children: Secondary | ICD-10-CM

## 2012-09-26 DIAGNOSIS — R51 Headache: Secondary | ICD-10-CM | POA: Diagnosis not present

## 2012-09-26 DIAGNOSIS — E1165 Type 2 diabetes mellitus with hyperglycemia: Secondary | ICD-10-CM

## 2012-09-26 DIAGNOSIS — IMO0002 Reserved for concepts with insufficient information to code with codable children: Secondary | ICD-10-CM

## 2012-09-26 LAB — COMPREHENSIVE METABOLIC PANEL
ALT: 20 U/L (ref 0–35)
AST: 13 U/L (ref 0–37)
Albumin: 3.6 g/dL (ref 3.5–5.2)
Alkaline Phosphatase: 146 U/L — ABNORMAL HIGH (ref 39–117)
BUN: 16 mg/dL (ref 6–23)
CO2: 29 mEq/L (ref 19–32)
Calcium: 9 mg/dL (ref 8.4–10.5)
Chloride: 102 mEq/L (ref 96–112)
Creat: 0.78 mg/dL (ref 0.50–1.10)
Glucose, Bld: 89 mg/dL (ref 70–99)
Potassium: 4.4 mEq/L (ref 3.5–5.3)
Sodium: 138 mEq/L (ref 135–145)
Total Bilirubin: 0.3 mg/dL (ref 0.3–1.2)
Total Protein: 6.9 g/dL (ref 6.0–8.3)

## 2012-09-26 LAB — GLUCOSE, POCT (MANUAL RESULT ENTRY): POC Glucose: 96 mg/dl (ref 70–99)

## 2012-09-26 LAB — POCT CBC
Granulocyte percent: 56.3 %G (ref 37–80)
HCT, POC: 40 % (ref 37.7–47.9)
Hemoglobin: 11.9 g/dL — AB (ref 12.2–16.2)
Lymph, poc: 2.9 (ref 0.6–3.4)
MCH, POC: 28.7 pg (ref 27–31.2)
MCHC: 29.8 g/dL — AB (ref 31.8–35.4)
MCV: 96.6 fL (ref 80–97)
MID (cbc): 0.6 (ref 0–0.9)
MPV: 8.9 fL (ref 0–99.8)
POC Granulocyte: 4.4 (ref 2–6.9)
POC LYMPH PERCENT: 36.4 %L (ref 10–50)
POC MID %: 7.3 %M (ref 0–12)
Platelet Count, POC: 294 10*3/uL (ref 142–424)
RBC: 4.14 M/uL (ref 4.04–5.48)
RDW, POC: 14.7 %
WBC: 7.9 10*3/uL (ref 4.6–10.2)

## 2012-09-26 LAB — POCT GLYCOSYLATED HEMOGLOBIN (HGB A1C): Hemoglobin A1C: 5.8

## 2012-09-26 MED ORDER — KETOROLAC TROMETHAMINE 60 MG/2ML IM SOLN
60.0000 mg | Freq: Once | INTRAMUSCULAR | Status: AC
  Administered 2012-09-26: 60 mg via INTRAMUSCULAR

## 2012-09-26 NOTE — Progress Notes (Signed)
41 yo with two days of headache and facial fullness associated with photophobia.  No nausea or vomiting today, did have vomiting on Thursday.  No head trauma.  No double vision  Objective:  NAD Neuro: normal mental status, CN  III-XII intact, gait and motor intact  HEENT:  Unremarkable except pupils are 1 mm and equally constricted Neck: supple Chest:  Clear Heart: reg, no murmur Skin: no new rash Ext:  No edema Results for orders placed in visit on 09/26/12  GLUCOSE, POCT (MANUAL RESULT ENTRY)      Component Value Range   POC Glucose 96  70 - 99 mg/dl  POCT CBC      Component Value Range   WBC 7.9  4.6 - 10.2 K/uL   Lymph, poc 2.9  0.6 - 3.4   POC LYMPH PERCENT 36.4  10 - 50 %L   MID (cbc) 0.6  0 - 0.9   POC MID % 7.3  0 - 12 %M   POC Granulocyte 4.4  2 - 6.9   Granulocyte percent 56.3  37 - 80 %G   RBC 4.14  4.04 - 5.48 M/uL   Hemoglobin 11.9 (*) 12.2 - 16.2 g/dL   HCT, POC 96.2  95.2 - 47.9 %   MCV 96.6  80 - 97 fL   MCH, POC 28.7  27 - 31.2 pg   MCHC 29.8 (*) 31.8 - 35.4 g/dL   RDW, POC 84.1     Platelet Count, POC 294  142 - 424 K/uL   MPV 8.9  0 - 99.8 fL      Assessment:  Migraine x 2 days.  Plan; 1. Headache  POCT glucose (manual entry), POCT CBC, POCT glycosylated hemoglobin (Hb A1C), Comprehensive metabolic panel, ketorolac (TORADOL) injection 60 mg  2. Diabetes type 2, uncontrolled  POCT glucose (manual entry), POCT CBC, POCT glycosylated hemoglobin (Hb A1C), Comprehensive metabolic panel   Follow up 3 days

## 2012-09-26 NOTE — Telephone Encounter (Signed)
PT STATES STATES SHE IS PRETTY SURE IF DR. L KNEW SHE WAS OUT OF HER RX HE WOULD TAKE CARE OF THIS. SHE HAS BEEN VERY SICK, CAN WE LET DR. L KNOW. [phone]

## 2012-09-26 NOTE — Telephone Encounter (Signed)
Pt came in to be seen

## 2012-09-26 NOTE — Patient Instructions (Signed)
Migraine Headache A migraine headache is an intense, throbbing pain on one or both sides of your head. A migraine can last for 30 minutes to several hours. CAUSES  The exact cause of a migraine headache is not always known. However, a migraine may be caused when nerves in the brain become irritated and release chemicals that cause inflammation. This causes pain. SYMPTOMS  Pain on one or both sides of your head.  Pulsating or throbbing pain.  Severe pain that prevents daily activities.  Pain that is aggravated by any physical activity.  Nausea, vomiting, or both.  Dizziness.  Pain with exposure to bright lights, loud noises, or activity.  General sensitivity to bright lights, loud noises, or smells. Before you get a migraine, you may get warning signs that a migraine is coming (aura). An aura may include:  Seeing flashing lights.  Seeing bright spots, halos, or zig-zag lines.  Having tunnel vision or blurred vision.  Having feelings of numbness or tingling.  Having trouble talking.  Having muscle weakness. MIGRAINE TRIGGERS  Alcohol.  Smoking.  Stress.  Menstruation.  Aged cheeses.  Foods or drinks that contain nitrates, glutamate, aspartame, or tyramine.  Lack of sleep.  Chocolate.  Caffeine.  Hunger.  Physical exertion.  Fatigue.  Medicines used to treat chest pain (nitroglycerine), birth control pills, estrogen, and some blood pressure medicines. DIAGNOSIS  A migraine headache is often diagnosed based on:  Symptoms.  Physical examination.  A CT scan or MRI of your head. TREATMENT Medicines may be given for pain and nausea. Medicines can also be given to help prevent recurrent migraines.  HOME CARE INSTRUCTIONS  Only take over-the-counter or prescription medicines for pain or discomfort as directed by your caregiver. The use of long-term narcotics is not recommended.  Lie down in a dark, quiet room when you have a migraine.  Keep a journal  to find out what may trigger your migraine headaches. For example, write down:  What you eat and drink.  How much sleep you get.  Any change to your diet or medicines.  Limit alcohol consumption.  Quit smoking if you smoke.  Get 7 to 9 hours of sleep, or as recommended by your caregiver.  Limit stress.  Keep lights dim if bright lights bother you and make your migraines worse. SEEK IMMEDIATE MEDICAL CARE IF:   Your migraine becomes severe.  You have a fever.  You have a stiff neck.  You have vision loss.  You have muscular weakness or loss of muscle control.  You start losing your balance or have trouble walking.  You feel faint or pass out.  You have severe symptoms that are different from your first symptoms. MAKE SURE YOU:   Understand these instructions.  Will watch your condition.  Will get help right away if you are not doing well or get worse. Document Released: 11/25/2005 Document Revised: 02/17/2012 Document Reviewed: 11/15/2011 [employer] Patient Information 2013 [employer].  

## 2012-09-29 ENCOUNTER — Encounter: Payer: Self-pay | Admitting: Family Medicine

## 2012-09-29 ENCOUNTER — Ambulatory Visit (INDEPENDENT_AMBULATORY_CARE_PROVIDER_SITE_OTHER): Payer: Medicare Other | Admitting: Family Medicine

## 2012-09-29 VITALS — BP 143/81 | HR 75 | Temp 98.4°F | Resp 18 | Ht 61.0 in | Wt 290.0 lb

## 2012-09-29 DIAGNOSIS — R51 Headache: Secondary | ICD-10-CM | POA: Diagnosis not present

## 2012-09-29 DIAGNOSIS — E669 Obesity, unspecified: Secondary | ICD-10-CM

## 2012-09-29 NOTE — Progress Notes (Signed)
S:  Patient here for follow up of visit 2 days ago.  Headache has resolved.    ROS:  No chest pain, cough, shortness of breath, leg swelling  Objective:  NAD Chest:  Clear Heart:  Reg, no murmur Ext:  1 + pedal edema  Results for orders placed in visit on 09/26/12  GLUCOSE, POCT (MANUAL RESULT ENTRY)      Component Value Range   POC Glucose 96  70 - 99 mg/dl  POCT CBC      Component Value Range   WBC 7.9  4.6 - 10.2 K/uL   Lymph, poc 2.9  0.6 - 3.4   POC LYMPH PERCENT 36.4  10 - 50 %L   MID (cbc) 0.6  0 - 0.9   POC MID % 7.3  0 - 12 %M   POC Granulocyte 4.4  2 - 6.9   Granulocyte percent 56.3  37 - 80 %G   RBC 4.14  4.04 - 5.48 M/uL   Hemoglobin 11.9 (*) 12.2 - 16.2 g/dL   HCT, POC 96.0  45.4 - 47.9 %   MCV 96.6  80 - 97 fL   MCH, POC 28.7  27 - 31.2 pg   MCHC 29.8 (*) 31.8 - 35.4 g/dL   RDW, POC 09.8     Platelet Count, POC 294  142 - 424 K/uL   MPV 8.9  0 - 99.8 fL  POCT GLYCOSYLATED HEMOGLOBIN (HGB A1C)      Component Value Range   Hemoglobin A1C 5.8    COMPREHENSIVE METABOLIC PANEL      Component Value Range   Sodium 138  135 - 145 mEq/L   Potassium 4.4  3.5 - 5.3 mEq/L   Chloride 102  96 - 112 mEq/L   CO2 29  19 - 32 mEq/L   Glucose, Bld 89  70 - 99 mg/dL   BUN 16  6 - 23 mg/dL   Creat 1.19  1.47 - 8.29 mg/dL   Total Bilirubin 0.3  0.3 - 1.2 mg/dL   Alkaline Phosphatase 146 (*) 39 - 117 U/L   AST 13  0 - 37 U/L   ALT 20  0 - 35 U/L   Total Protein 6.9  6.0 - 8.3 g/dL   Albumin 3.6  3.5 - 5.2 g/dL   Calcium 9.0  8.4 - 56.2 mg/dL   Assessment: needs to lose weight.  Plan:  Recheck in November.  Ask about sodas and diet

## 2012-10-01 DIAGNOSIS — E119 Type 2 diabetes mellitus without complications: Secondary | ICD-10-CM | POA: Diagnosis not present

## 2012-10-01 NOTE — Progress Notes (Signed)
This encounter was created in error - please disregard.

## 2012-10-16 DIAGNOSIS — G40309 Generalized idiopathic epilepsy and epileptic syndromes, not intractable, without status epilepticus: Secondary | ICD-10-CM | POA: Insufficient documentation

## 2012-10-19 ENCOUNTER — Encounter: Payer: Self-pay | Admitting: Family Medicine

## 2012-10-19 ENCOUNTER — Ambulatory Visit (INDEPENDENT_AMBULATORY_CARE_PROVIDER_SITE_OTHER): Payer: Medicare Other | Admitting: Family Medicine

## 2012-10-19 VITALS — BP 126/86 | HR 74 | Temp 98.4°F | Resp 16 | Ht 60.5 in | Wt 288.2 lb

## 2012-10-19 DIAGNOSIS — E119 Type 2 diabetes mellitus without complications: Secondary | ICD-10-CM

## 2012-10-19 DIAGNOSIS — E669 Obesity, unspecified: Secondary | ICD-10-CM

## 2012-10-19 DIAGNOSIS — G40909 Epilepsy, unspecified, not intractable, without status epilepticus: Secondary | ICD-10-CM

## 2012-10-19 DIAGNOSIS — Z23 Encounter for immunization: Secondary | ICD-10-CM

## 2012-10-19 DIAGNOSIS — E1169 Type 2 diabetes mellitus with other specified complication: Secondary | ICD-10-CM

## 2012-10-19 DIAGNOSIS — F32A Depression, unspecified: Secondary | ICD-10-CM

## 2012-10-19 DIAGNOSIS — F3289 Other specified depressive episodes: Secondary | ICD-10-CM | POA: Diagnosis not present

## 2012-10-19 DIAGNOSIS — F329 Major depressive disorder, single episode, unspecified: Secondary | ICD-10-CM

## 2012-10-19 MED ORDER — METFORMIN HCL 500 MG PO TABS: 500.0000 mg | ORAL_TABLET | Freq: Two times a day (BID) | ORAL | Status: DC

## 2012-10-19 MED ORDER — SITAGLIPTIN PHOSPHATE 50 MG PO TABS: 50.0000 mg | ORAL_TABLET | Freq: Every day | ORAL | Status: DC

## 2012-10-19 NOTE — Addendum Note (Signed)
Addended by: Anselm Pancoast R on: 10/19/2012 02:36 PM   Modules accepted: Orders

## 2012-10-19 NOTE — Progress Notes (Signed)
@[email or web address] @  Patient ID: Brandi Gomez MRN: [id number], DOB: 1971-11-05, 41 y.o. Date of Encounter: 10/19/2012, 1:26 PM  Primary Physician: Elvina Sidle, MD  Chief Complaint: Diabetes follow up  HPI: 41 y.o. year old female with history below presents for follow up of diabetes mellitus. Doing well. No issues or complaints. Taking medications daily without adverse effects. No polydipsia, polyphagia, polyuria, or nocturia.  Blood sugars at home:  Not checked  Not exercising regularly because of low back pain chronically. Last A1C: 07/11/12:  6.5  Eye MD: 1 week ago Neurologist:  Stopped  DDS:  Three years ago. Influenza vaccine: today Pneumococcal vaccine: never   Past Medical History  Diagnosis Date  . Type II or unspecified type diabetes mellitus without mention of complication, not stated as uncontrolled   . Hypertension   . Seizures   . Sleep apnea   . Anxiety   . Depression   . Bipolar 1 disorder      Home Meds: Prior to Admission medications   Medication Sig Start Date End Date Taking? Authorizing Provider  citalopram (CELEXA) 20 MG tablet Take 2 tablets (40 mg total) by mouth daily. For depression. 06/24/12  Yes Mike Craze, MD  ferrous gluconate (FERGON) 324 MG tablet Take 324 mg by mouth daily with breakfast. 07/11/12 07/11/13 Yes Morrell Riddle, PA-C  ferrous sulfate 325 (65 FE) MG EC tablet Take 1 tablet (325 mg total) by mouth 3 (three) times daily with meals. 09/24/12  Yes Elvina Sidle, MD  furosemide (LASIX) 40 MG tablet Take 1 tablet (40 mg total) by mouth daily. For high blood pressure control and fluid build up control 06/24/12  Yes Mike Craze, MD  ibuprofen (ADVIL,MOTRIN) 200 MG tablet Take 200 mg by mouth every 6 (six) hours as needed.   Yes Historical Provider, MD  magnesium oxide (MAG-OX 400) 400 MG tablet Take 1 tablet (400 mg total) by mouth daily. 08/06/12 08/06/13 Yes Elvina Sidle, MD  metFORMIN (GLUCOPHAGE) 500 MG tablet Take 1 tablet (500 mg  total) by mouth 2 (two) times daily with a meal. For control of blood sugar 06/24/12  Yes Mike Craze, MD  phenytoin (DILANTIN INFATABS) 50 MG tablet Chew 1 tablet (50 mg total) by mouth at bedtime. For seizure control 06/24/12  Yes Mike Craze, MD  phenytoin (DILANTIN) 100 MG ER capsule Take 2 capsules (200 mg total) by mouth 2 (two) times daily. For seizure control 06/24/12  Yes Mike Craze, MD  sitaGLIPtin (JANUVIA) 50 MG tablet Take 1 tablet (50 mg total) by mouth daily. For control of blood sugar 09/21/12  Yes Elvina Sidle, MD  traZODone (DESYREL) 100 MG tablet Take 1 tablet (100 mg total) by mouth at bedtime. 08/08/12 10/26/12 Yes Elvina Sidle, MD  donepezil (ARICEPT) 10 MG tablet Take 1 tablet (10 mg total) by mouth at bedtime. For [facility] 06/24/12   Mike Craze, MD  meclizine (ANTIVERT) 25 MG tablet Take 2 tablets (50 mg total) by mouth 3 (three) times daily as needed for dizziness. 09/20/12   Lisette Paz, PA-C  topiramate (TOPAMAX) 100 MG tablet Take 100 mg by mouth 2 (two) times daily.    Historical Provider, MD  traZODone (DESYREL) 100 MG tablet Take 1 tablet (100 mg total) by mouth at bedtime. For insomnia. 06/24/12 07/24/12  Mike Craze, MD    Allergies:  Allergies  Allergen Reactions  . Hydrocodone Other (See Comments)    Depressed     History   Social  History  . Marital Status: Single    Spouse Name: N/A    Number of Children: N/A  . Years of Education: N/A   Occupational History  . disabled    Social History Main Topics  . Smoking status: Never Smoker   . Smokeless tobacco: Never Used  . Alcohol Use: No  . Drug Use: No  . Sexually Active: Not on file   Other Topics Concern  . Not on file   Social History Narrative  . No narrative on file     Review of Systems: Constitutional: negative for chills, fever, night sweats, weight changes, or fatigue  HEENT: negative for vision changes, hearing loss, congestion, rhinorrhea, or  epistaxis Cardiovascular: negative for chest pain, palpitations, diaphoresis, DOE, orthopnea, or edema Respiratory: negative for hemoptysis, wheezing, shortness of breath, dyspnea, or cough Abdominal: negative for abdominal pain, nausea, vomiting, diarrhea, or constipation Dermatological: negative for rash, erythema, or wounds Neurologic: negative for headache, dizziness, or syncope Renal:  Negative for polyuria, polydipsia, or dysuria All other systems reviewed and are otherwise negative with the exception to those above and in the HPI.   Physical Exam: Blood pressure 126/86, pulse 74, temperature 98.4 F (36.9 C), temperature source Oral, resp. rate 16, height 5' 0.5" (1.537 m), weight 288 lb 3.2 oz (130.727 kg), last menstrual period 09/13/2012, SpO2 97.00%., Body mass index is 55.36 kg/(m^2). General: Well developed, well nourished, in no acute distress. Head: Normocephalic, atraumatic, eyes without discharge, sclera non-icteric, nares are without discharge. Bilateral auditory canals clear, TM's are without perforation, pearly grey and translucent with reflective cone of light bilaterally. Oral cavity moist, posterior pharynx without exudate, erythema, peritonsillar abscess, or post nasal drip.  Neck: Supple. No thyromegaly. Full ROM. No lymphadenopathy. Lungs: Clear bilaterally to auscultation without wheezes, rales, or rhonchi. Breathing is unlabored. Heart: RRR with S1 S2. No murmurs, rubs, or gallops appreciated. Abdomen: Soft, non-tender, non-distended with normoactive bowel sounds. No hepatosplenomegaly. No rebound/guarding. No obvious abdominal masses. Msk:  Strength and tone normal for 41 Extremities/Skin: Warm and dry. No clubbing or cyanosis. No edema. No rashes, wounds, or suspicious lesions. Monofilament exam unremarkable bilaterally.  Neuro: Alert and oriented X 3. Moves all extremities spontaneously. Gait is normal. CNII-XII grossly in tact. Psych:  Responds to questions  appropriately with a normal affect.   Labs:   ASSESSMENT AND PLAN:  41 y.o. year old female with diabetes.  Has gained weight recently, but otherwise seems stable -  Signed, Elvina Sidle, MD 10/19/2012 1:26 PM

## 2012-10-21 DIAGNOSIS — Z5181 Encounter for therapeutic drug level monitoring: Secondary | ICD-10-CM | POA: Diagnosis not present

## 2012-10-31 ENCOUNTER — Encounter: Payer: Self-pay | Admitting: Family Medicine

## 2012-11-19 ENCOUNTER — Ambulatory Visit (INDEPENDENT_AMBULATORY_CARE_PROVIDER_SITE_OTHER): Payer: Medicare Other | Admitting: Family Medicine

## 2012-11-19 ENCOUNTER — Encounter: Payer: Self-pay | Admitting: Family Medicine

## 2012-11-19 VITALS — BP 112/90 | HR 90 | Temp 98.5°F | Resp 16 | Ht 60.0 in | Wt 292.0 lb

## 2012-11-19 DIAGNOSIS — R609 Edema, unspecified: Secondary | ICD-10-CM | POA: Diagnosis not present

## 2012-11-19 DIAGNOSIS — E669 Obesity, unspecified: Secondary | ICD-10-CM

## 2012-11-19 DIAGNOSIS — F418 Other specified anxiety disorders: Secondary | ICD-10-CM

## 2012-11-19 DIAGNOSIS — E119 Type 2 diabetes mellitus without complications: Secondary | ICD-10-CM

## 2012-11-19 DIAGNOSIS — E1169 Type 2 diabetes mellitus with other specified complication: Secondary | ICD-10-CM

## 2012-11-19 DIAGNOSIS — F341 Dysthymic disorder: Secondary | ICD-10-CM | POA: Diagnosis not present

## 2012-11-19 LAB — POCT GLYCOSYLATED HEMOGLOBIN (HGB A1C): Hemoglobin A1C: 6.1

## 2012-11-19 MED ORDER — CITALOPRAM HYDROBROMIDE 20 MG PO TABS: 40.0000 mg | ORAL_TABLET | Freq: Every day | ORAL | Status: DC

## 2012-11-19 MED ORDER — FUROSEMIDE 40 MG PO TABS: 40.0000 mg | ORAL_TABLET | Freq: Every day | ORAL | Status: DC

## 2012-11-19 MED ORDER — METFORMIN HCL 500 MG PO TABS: 500.0000 mg | ORAL_TABLET | Freq: Two times a day (BID) | ORAL | Status: DC

## 2012-11-19 MED ORDER — SITAGLIPTIN PHOSPHATE 50 MG PO TABS: 50.0000 mg | ORAL_TABLET | Freq: Every day | ORAL | Status: DC

## 2012-11-19 NOTE — Progress Notes (Signed)
41 yo woman living with disabled man, Kingsley Callander.  She is disabled herself and living in a motel room which makes her claustrophobic.  She has had her oxygen concentrator and CPAP machines.  She is very stressed.  Eating more and gaining weight.  Having dyspnea on exertion.  Cannot buy daughter a Christmas present.  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  Objective: crying in the office   Patient ID: Brandi Gomez MRN: [id number], DOB: 04/29/1971, 41 y.o. Date of Encounter: 11/19/2012, 11:04 AM  Primary Physician: Elvina Sidle, MD  Chief Complaint: Diabetes follow up  HPI: 41 y.o. year old female with history below presents for follow up of diabetes mellitus. Doing well. No issues or complaints. Taking medications daily without adverse effects. No polydipsia, polyphagia, polyuria, or nocturia.  Blood sugars at home: Diet consists of: Exercising regularly. Last A1C:   Eye MD: DDS: Influenza vaccine: Pneumococcal vaccine: Last CPE:  Past Medical History  Diagnosis Date  . Type II or unspecified type diabetes mellitus without mention of complication, not stated as uncontrolled   . Hypertension   . Sleep apnea   . Anxiety   . Depression   . Bipolar 1 disorder   . Seizures     intractable  . Obesity   . Mild mental retardation      Home Meds: Prior to Admission medications   Medication Sig Start Date End Date Taking? Authorizing Provider  citalopram (CELEXA) 20 MG tablet Take 2 tablets (40 mg total) by mouth daily. For depression. 11/19/12  Yes Elvina Sidle, MD  ferrous gluconate (FERGON) 324 MG tablet Take 324 mg by mouth daily with breakfast. 07/11/12 07/11/13 Yes Morrell Riddle, PA-C  ferrous sulfate 325 (65 FE) MG EC tablet Take 1 tablet (325 mg total) by mouth 3 (three) times daily with meals. 09/24/12  Yes Elvina Sidle, MD  furosemide (LASIX) 40 MG tablet Take 1 tablet (40 mg total) by mouth daily. For high blood pressure control and  fluid build up control 11/19/12  Yes Elvina Sidle, MD  ibuprofen (ADVIL,MOTRIN) 200 MG tablet Take 200 mg by mouth every 6 (six) hours as needed.   Yes Historical Provider, MD  magnesium oxide (MAG-OX 400) 400 MG tablet Take 1 tablet (400 mg total) by mouth daily. 08/06/12 08/06/13 Yes Elvina Sidle, MD  meclizine (ANTIVERT) 25 MG tablet Take 2 tablets (50 mg total) by mouth 3 (three) times daily as needed for dizziness. 09/20/12  Yes Lisette Paz, PA-C  metFORMIN (GLUCOPHAGE) 500 MG tablet Take 1 tablet (500 mg total) by mouth 2 (two) times daily with a meal. For control of blood sugar 11/19/12  Yes Elvina Sidle, MD  phenytoin (DILANTIN INFATABS) 50 MG tablet Chew 1 tablet (50 mg total) by mouth at bedtime. For seizure control 06/24/12  Yes Mike Craze, MD  phenytoin (DILANTIN) 100 MG ER capsule Take 2 capsules (200 mg total) by mouth 2 (two) times daily. For seizure control 06/24/12  Yes Mike Craze, MD  sitaGLIPtin (JANUVIA) 50 MG tablet Take 1 tablet (50 mg total) by mouth daily. For control of blood sugar 11/19/12  Yes Elvina Sidle, MD  topiramate (TOPAMAX) 100 MG tablet Take 100 mg by mouth 2 (two) times daily.    Historical Provider, MD  traZODone (DESYREL) 100 MG tablet Take 1 tablet (100 mg total) by mouth at bedtime. For insomnia. 06/24/12 07/24/12  Mike Craze, MD  traZODone (DESYREL) 100 MG tablet Take 1 tablet (  100 mg total) by mouth at bedtime. 08/08/12 10/26/12  Elvina Sidle, MD    Allergies:  Allergies  Allergen Reactions  . Hydrocodone Other (See Comments)    Depressed     History   Social History  . Marital Status: Single    Spouse Name: N/A    Number of Children: N/A  . Years of Education: N/A   Occupational History  . disabled    Social History Main Topics  . Smoking status: Never Smoker   . Smokeless tobacco: Never Used  . Alcohol Use: No  . Drug Use: No  . Sexually Active: Not on file   Other Topics Concern  . Not on file   Social History  Narrative  . No narrative on file     Review of Systems: Constitutional: negative for chills, fever, night sweats, weight changes, or fatigue  HEENT: negative for vision changes, hearing loss, congestion, rhinorrhea, or epistaxis Cardiovascular: negative for chest pain, palpitations, diaphoresis, DOE, orthopnea, or edema Respiratory: negative for hemoptysis, wheezing, shortness of breath, dyspnea, or cough Abdominal: negative for abdominal pain, nausea, vomiting, diarrhea, or constipation Dermatological: negative for rash, erythema, or wounds Neurologic: negative for headache, dizziness, or syncope Renal:  Negative for polyuria, polydipsia, or dysuria All other systems reviewed and are otherwise negative with the exception to those above and in the HPI.   Physical Exam: Blood pressure 112/90, pulse 90, temperature 98.5 F (36.9 C), temperature source Oral, resp. rate 16, height 5' (1.524 m), weight 292 lb (132.45 kg), last menstrual period 11/02/2012, SpO2 99.00%., Body mass index is 57.03 kg/(m^2). General: Well developed, well nourished, in no acute distress. Head: Normocephalic, atraumatic, eyes without discharge, sclera non-icteric, nares are without discharge. Bilateral auditory canals clear, TM's are without perforation, pearly grey and translucent with reflective cone of light bilaterally. Oral cavity moist, posterior pharynx without exudate, erythema, peritonsillar abscess, or post nasal drip.  Neck: Supple. No thyromegaly. Full ROM. No lymphadenopathy. Lungs: Clear bilaterally to auscultation without wheezes, rales, or rhonchi. Breathing is unlabored. Heart: RRR with S1 S2. No murmurs, rubs, or gallops appreciated. Abdomen: Soft, non-tender, non-distended with normoactive bowel sounds. No hepatosplenomegaly. No rebound/guarding. No obvious abdominal masses. Msk:  Strength and tone normal for age. Extremities/Skin: Warm and dry. No clubbing or cyanosis. No edema. No rashes, wounds,  or suspicious lesions. Monofilament exam unremarkable bilaterally.  Neuro: Alert and oriented X 3. Moves all extremities spontaneously. Gait is normal. CNII-XII grossly in tact. Psych:  Responds to questions appropriately with a normal affect.   Labs: Results for orders placed in visit on 11/19/12  POCT GLYCOSYLATED HEMOGLOBIN (HGB A1C)      Component Value Range   Hemoglobin A1C 6.1        ASSESSMENT AND PLAN:  41 y.o. year old female with diabetes, depression I will try to restart the citalopram and see patient back in a month. 1. Diabetes mellitus type 2 in obese  metFORMIN (GLUCOPHAGE) 500 MG tablet, sitaGLIPtin (JANUVIA) 50 MG tablet, POCT glycosylated hemoglobin (Hb A1C)  2. Edema  furosemide (LASIX) 40 MG tablet  3. Depression with anxiety  citalopram (CELEXA) 20 MG tablet    -  Signed, Elvina Sidle, MD 11/19/2012 11:04 AM

## 2012-11-19 NOTE — Patient Instructions (Signed)
New address:  [facility] [address] [place], near [place] phone:  [phone] Patient's phone: [phone]  Or [phone]

## 2012-11-27 ENCOUNTER — Telehealth: Payer: Self-pay

## 2012-11-27 NOTE — Telephone Encounter (Signed)
Pt needs Dr. Arabella Merles to call her, she has some questions [phone]

## 2012-11-27 NOTE — Telephone Encounter (Signed)
I called her, she is asking about whether or not you are still doing christmas gifts and food this year.

## 2012-11-30 NOTE — Telephone Encounter (Signed)
Tried to call, no answer

## 2012-11-30 NOTE — Telephone Encounter (Signed)
Have patient come in before we close tomorrow.  I am trying to pick a chicken up by 1 pm

## 2012-12-31 ENCOUNTER — Ambulatory Visit (INDEPENDENT_AMBULATORY_CARE_PROVIDER_SITE_OTHER): Payer: Medicare Other | Admitting: Family Medicine

## 2012-12-31 ENCOUNTER — Encounter: Payer: Self-pay | Admitting: Family Medicine

## 2012-12-31 VITALS — BP 130/80 | HR 76 | Temp 98.6°F | Resp 16 | Ht 60.5 in | Wt 285.0 lb

## 2012-12-31 DIAGNOSIS — E669 Obesity, unspecified: Secondary | ICD-10-CM | POA: Diagnosis not present

## 2012-12-31 DIAGNOSIS — E119 Type 2 diabetes mellitus without complications: Secondary | ICD-10-CM

## 2012-12-31 DIAGNOSIS — H6690 Otitis media, unspecified, unspecified ear: Secondary | ICD-10-CM

## 2012-12-31 MED ORDER — CIPROFLOXACIN HCL 500 MG PO TABS: 500.0000 mg | ORAL_TABLET | Freq: Two times a day (BID) | ORAL | Status: DC

## 2012-12-31 NOTE — Progress Notes (Signed)
42 yo woman living in poverty.  She has an apartment in [place] for $600/mo, living on $900/mo social security.  Living with Mr. Almedia Balls and granddaughter (31 yo) in school [address].  No longer has car:  Father brought her here.  No new changes in diabetes Left ear has been draining and ringing at times.  Hearing is muffled. X 2 weeks Last seen by eye doctor 6 months ago  Unsure about last urine microalb Last hgb A1C was 11/2012:  6.1  Objective:  NAD TM's bilateral retraction with yellow pus behind left TM Oroph:  Clear Neck: supple, no adenopathy Chest: clear Heart:  Reg, no murmur Ext: no edema, filament testing normal   Assessment:  Morbid obesity, controlled type 2 diabetes, otitis media  1. Otitis media  ciprofloxacin (CIPRO) 500 MG tablet  2. Obesity (BMI 30.0-34.9)  Ambulatory referral to General Surgery  3. Diabetes type 2, controlled  Microalbumin, urine

## 2012-12-31 NOTE — Patient Instructions (Addendum)
Bariatric Surgery (Gastrointestinal Surgery for Severe Obesity) Severe obesity is a longstanding condition. It is difficult to treat through diet and exercise alone. Gastrointestinal surgery is the best option for people who are severely obese and cannot lose weight by traditional means, or who suffer from serious obesity-related health problems. The surgery promotes weight loss by decreasing the absorption of food and, in some operations, interrupting the digestive process. As in other treatments for obesity, the best results are achieved with healthy eating behaviors and regular physical activity.  People who may consider gastrointestinal surgery include those with a body mass index (BMI) above 40. This is about 100 pounds of overweight for men and 80 pounds for women. People with a BMI between 35 and 40 and who suffer from type 2 diabetes or life-threatening cardiopulmonary (heart and lung) problems, such as severe sleep apnea or obesity-related heart disease, may also be candidates for surgery. (To use the Body Mass Index chart. find your weight on the bottom of the graph. Go straight up from that point until you come to the line that matches your height. Then look to find your weight group). The idea of gastrointestinal surgery to control obesity grew out of results of operations for cancer or severe ulcers that removed large portions of the stomach or small intestine. Patients undergoing these procedures tended to lose weight after surgery. So some physicians began to use such operations to treat severe obesity. The first operation that was widely used for severe obesity was the intestinal bypass. This operation was first used 40 years ago. It produced weight loss by causing malabsorption. The idea was that patients could eat large amounts of food, which would be poorly digested or passed along too fast for the body to absorb many calories. The problem with this surgery was that it caused a loss of  essential nutrients. Also, its side effects were unpredictable and sometimes fatal. The original form of the intestinal bypass operation is no longer used. THE NORMAL DIGESTIVE PROCESS Normally, as food moves along the digestive tract, digestive juices and enzymes digest and absorb calories and nutrients. After we chew and swallow our food, it moves down the esophagus to the stomach. There a strong acid continues the digestive process. The stomach can hold about 3 pints of food at one time. When the stomach contents move to the first portion of the small intestine (duodenum ), bile and pancreatic juice speed up digestion. Most of the iron and calcium in the foods we eat is absorbed in the duodenum. The jejunum and ileum are the remaining two segments of the nearly 20 feet of small intestine. They complete the absorption of almost all calories and nutrients. The food particles that cannot be digested in the small intestine are stored in the large intestine until eliminated.  HOW DOES SURGERY PROMOTE WEIGHT LOSS? Gastrointestinal surgery for obesity is also called bariatric surgery. It alters the digestive process. The operations promote weight loss by closing off parts of the stomach. This will make it smaller. Operations that only reduce stomach size are known as "restrictive operations". They restrict the amount of food the stomach can hold. Some operations combine stomach restriction with a partial bypass of the small intestine. These procedures create a direct connection from the stomach to the lower segment of the small intestine. This causes bypassing portions of the digestive tract that absorb calories and nutrients. These are known as malabsorptive operations. WHAT ARE THE SURGICAL OPTIONS? There are several types of restrictive and  malabsorptive operations. Each one carries its own benefits and risks.  Restrictive Operations  Restrictive operations serve only to restrict food intake. They do not  interfere with the normal digestive process. To perform the surgery, doctors create a small pouch at the top of the stomach where food enters from the esophagus. At first, the pouch holds about 1 ounce of food. It later expands to 2-3 ounces. The lower outlet of the pouch usually has a diameter of only about  inch. This small outlet delays the emptying of food from the pouch and causes a feeling of fullness. As a result of this surgery, most people lose the ability to eat large amounts of food at one time. After an operation, the person usually can eat only  to 1 cup of food without discomfort or nausea. Also, food has to be well chewed. Restrictive operations for obesity include adjustable gastric banding (AGB) and vertical banded gastroplasty (VBG).  Adjustable gastric banding  In this procedure, a hollow band made of special material is placed around the stomach near its upper end. This creates a small pouch and a narrow passage into the larger remainder of the stomach. The band is then inflated with a salt solution. It can be tightened or loosened over time to change the size of the passage by increasing or decreasing the amount of salt solution.  The band is adjusted based on feelings of hunger and weight loss. Patients decide when they need an adjustment and come to their surgeons to evaluate this. The adjustment is done as an office visit. The band is fully reversible with a second surgery if the patient changes his/her mind. There is no cutting or re-routing of the intestine.  Vertical banded gastroplasty  VBG has been the most common restrictive operation for weight control. Both a band and staples are used to create a small stomach pouch. Vertical banded gastroplasty is based on the same principle of restriction as the band. But the stomach is surgically altered with the stapling. This treatment is not reversible.  Restrictive operations lead to weight loss in almost all patients. But they are  less successful than malabsorptive operations in achieving substantial, long-term weight loss. About 30 percent of those who undergo VBG achieve normal weight. About 80 percent achieve some degree of weight loss. Some patients regain weight. Others are unable to adjust their eating habits and fail to lose the desired weight. Successful results depend on the patient's willingness to adopt a long-term plan of healthy eating and regular physical activity.  A common risk of restrictive operations is vomiting. This is caused when the small stomach is overly stretched by food particles that have not been chewed well. Band slippage and saline leakage have been reported after AGB. Risks of VBG include wearing away of the band and breakdown of the staple line. In a small number of cases, stomach juices may leak into the abdomen. This requires an emergency operation. In less than 1 percent of all cases, infection or death from complications may occur. Malabsorptive Operations  Malabsorptive operations are the most common gastrointestinal surgeries for weight loss. They restrict both food intake and the amount of calories and nutrients the body absorbs.  Roux-en-Y gastric bypass (RGB)  This operation is the most common and successful malabsorptive surgery. First, a small stomach pouch is created to restrict food intake. Next, a Y-shaped section of the small intestine is attached to the pouch. This allows food to bypass the lower stomach, the first   segment of the small intestine (duodenum), and the first portion of the jejunum (the second segment of the small intestine). This bypass reduces the amount of calories and nutrients the body absorbs.  Biliopancreatic diversion (BPD)  In this more complicated malabsorptive operation, portions of the stomach are removed. The small pouch that remains is connected directly to the final segment of the small intestine, completely bypassing the duodenum and the jejunum. This  procedure successfully promotes weight loss. But it is less frequently used than other types of surgery because of the high risk for nutritional deficiencies. A variation of BPD includes a "duodenal switch". This leaves a larger portion of the stomach intact, including the pyloric valve. This valve regulates the release of stomach contents into the small intestine. It also keeps a small part of the duodenum in the digestive pathway.  Malabsorptive operations produce more weight loss than restrictive operations. And they are more effective in reversing the health problems associated with severe obesity. Patients who have malabsorptive operations generally lose two-thirds of their excess weight within 2 years.  In addition to the risks of restrictive surgeries, malabsorptive operations also carry greater risk for nutritional deficiencies. This is because the procedure causes food to bypass the duodenum and jejunum. That is where most iron and calcium are absorbed. Menstruating women may develop anemia because not enough vitamin B12 and iron are absorbed. Decreased absorption of calcium may also bring on osteoporosis and metabolic bone disease. Patients are required to take nutritional supplements that usually prevent these deficiencies. Patients who have the biliopancreatic diversion surgery must also take fat-soluble (dissolved by fat) vitamins A, D, E, and K supplements.  RGB and BPD operations may also cause "dumping syndrome". This means that stomach contents move too rapidly through the small intestine. Symptoms include nausea, weakness, sweating, faintness, and sometimes diarrhea after eating. The duodenal switch operation keeps the pyloric valve intact. So it may reduce the likelihood of dumping syndrome.  The more extensive the bypass, the greater the risk is for complications and nutritional deficiencies. Patients with extensive bypasses of the normal digestive process require close monitoring. They  also need life-long use of special foods, supplements, and medications. EXPLORE BENEFITS AND RISKS Surgery to produce weight loss is a serious undertaking. Anyone thinking about surgery should understand what the operation involves. Patients and physicians should carefully consider the following benefits and risks.  Benefits  Right after surgery, most patients lose weight quickly. They continue to lose for 18 to 24 months after the procedure. Most patients regain 5 to 10 percent of the weight they lost. But many maintain a long-term weight loss of about 100 pounds.  Surgery improves most obesity-related conditions. For example, in one study blood sugar levels of 83 percent of obese patients with diabetes returned to normal after surgery. Nearly all patients whose blood sugar levels did not return to normal were older. Or they had lived with diabetes for a long time. Risks  Ten to 20 percent of patients who have weight-loss surgery require follow-up operations to correct complications. Abdominal hernia was the most common complication requiring follow-up surgery. But laparoscopic techniques seem to have solved this problem. In laparoscopy, the surgeon makes one or more small incisions. Slender surgical instruments are passed them. This technique eliminates the need for a large incision. And it creates less tissue damage. Patients who are super obese (greater than 350 pounds) or have had previous abdominal surgery, may not be good candidates for laparoscopy. Less common complications include breakdown  of the staple line and stretched stomach outlets.  Some obese patients who have weight-loss surgery develop gallstones. These are clumps of cholesterol and other matter that form in the gallbladder. During quick or substantial weight loss, one's risk of developing gallstones increases. Taking supplemental bile salts for the first 6 months after surgery can prevent them.  Nearly 30 percent of patients who  have weight-loss surgery develop nutritional deficiencies. These include anemia, osteoporosis, and metabolic bone disease. These usually can be avoided if vitamin and mineral intakes are high enough.  Women of childbearing age should avoid pregnancy until their weight becomes stable. Quick weight loss and nutritional deficiencies can harm a growing fetus.  Other risks of restrictive surgeries include:  Band slippage.  Stomach prolapse.  Band erosion into the lumen of the stomach.  Port infection.  The main risk with malabsorption operations is life threatening. It is the risk of leak from any of the anastomosis. The more involved the operation, the more risk involved.  There is one other risk of having the surgery. If people do not follow a strict diet, they will stretch out their stomach pouches. Then they will not lose weight. MEDICAL COSTS Gastrointestinal surgery costs vary. They depend on the procedure. Medical insurance coverage varies by state and insurance provider. If you are considering gastrointestinal surgery, contact your r egional Medicare or Medicaid office or your insurance plan. Find out from them if the procedure is covered. IS THE SURGERY FOR YOU?  Gastrointestinal surgery may be the next step for people who remain severely obese after trying nonsurgical approaches or have an obesity-related disease. Candidates for surgery have:  A BMI of 40 or more.  A BMI of 35 or more and a life-threatening obesity-related health problem such as:  Diabetes.  Severe sleep apnea.  Heart disease.  Obesity-related physical problems that interfere with:  Employment.  Walking.  Family function. If you fit the profile for surgery, answers to these questions may help you decide whether weight-loss surgery is appropriate for you. Are you:  Unlikely to lose weight successfully without surgery?  Well informed about the surgical procedure? The effects of treatment?  Determined  to lose weight? Improve your health?  Aware of how your life may change after the operation? Adjustment to the side effects of the surgery include the need to chew well and being unable to eat large meals.  Aware of the potential for serious complications? Dietary restrictions? Occasional failures?  Committed to lifelong medical follow-up?  Restrictive operations are very successful with patients who follow a diet created by a dietician. Support groups and follow up with caregivers is important. Remember: There are no guarantees for any method to produce and maintain weight loss. This includes surgery. Success is possible only with:  Maximum cooperation.  Commitment to behavioral change.  Medical follow-up. This cooperation and commitment must be carried out for the rest of your life.  ADDITIONAL RESOURCES [employer] [address] [place], [place] [address] www.asmbs.org  Weight-control Information Network (WIN) [address] [place] [address] [employer] Document Released: 11/25/2005 Document Revised: 02/17/2012 Document Reviewed: 02/18/2007 [facility] Patient Information 2013 [place], [place]. Obesity Obesity is defined as having too much total body fat and a body mass index (BMI) of 30 or more. BMI is an estimate of body fat and is calculated from your height and weight. Obesity happens when you consume more calories than you can burn by exercising or performing daily physical  tasks. Prolonged obesity can cause major illnesses or emergencies, such as:   A stroke.  Heart disease.  Diabetes.  Cancer.  Arthritis.  High blood pressure (hypertension).  High cholesterol.  Sleep apnea.  Erectile dysfunction.  Infertility problems. CAUSES   Regularly eating unhealthy foods.  Physical inactivity.  Certain disorders, such as an underactive thyroid (hypothyroidism), Cushing's syndrome, and  polycystic ovarian syndrome.  Certain medicines, such as steroids, some depression medicines, and antipsychotics.  Genetics.  Lack of sleep. DIAGNOSIS  A caregiver can diagnose obesity after calculating your BMI. Obesity will be diagnosed if your BMI is 30 or higher.  There are other methods of measuring obesity levels. Some other methods include measuring your skin fold thickness, your waist circumference, and comparing your hip circumference to your waist circumference. TREATMENT  A healthy treatment program includes some or all of the following:  Long-term dietary changes.  Exercise and physical activity.  Behavioral and lifestyle changes.  Medicine only under the supervision of your caregiver. Medicines may help, but only if they are used with diet and exercise programs. An unhealthy treatment program includes:  Fasting.  Fad diets.  Supplements and drugs. These choices do not succeed in long-term weight control.  HOME CARE INSTRUCTIONS   Exercise and perform physical activity as directed by your caregiver. To increase physical activity, try the following:  Use stairs instead of elevators.  Park farther away from store entrances.  Garden, bike, or walk instead of watching television or using the computer.  Eat healthy, low-calorie foods and drinks on a regular basis. Eat more fruits and vegetables. Use low-calorie cookbooks or take healthy cooking classes.  Limit fast food, sweets, and processed snack foods.  Eat smaller portions.  Keep a daily journal of everything you eat. There are many free websites to help you with this. It may be helpful to measure your foods so you can determine if you are eating the correct portion sizes.  Avoid drinking alcohol. Drink more water and drinks without calories.  Take vitamins and supplements only as recommended by your caregiver.  Weight-loss support groups, Optometrist, counselors, and stress reduction education  can also be very helpful. SEEK IMMEDIATE MEDICAL CARE IF:  You have chest pain or tightness.  You have trouble breathing or feel short of breath.  You have weakness or leg numbness.  You feel confused or have trouble talking.  You have sudden changes in your vision. MAKE SURE YOU:  Understand these instructions.  Will watch your condition.  Will get help right away if you are not doing well or get worse. Document Released: 01/02/2005 Document Revised: 05/26/2012 Document Reviewed: 01/01/2012 [facility] Patient Information 2013 [place], [place]. Gastric Bypass Surgery Care After Refer to this sheet in the next few weeks. These discharge instructions provide you with general information on caring for yourself after you leave the hospital. Your caregiver may also give you specific instructions. Your treatment has been planned according to the most current medical practices available, but unavoidable complications sometimes occur. If you have any problems or questions after discharge, call your caregiver. HOME CARE INSTRUCTIONS  Activity  Take frequent walks throughout the day. This will help to prevent blood clots. Do not sit for longer than 45 minutes to 1 hour while awake for 4 to 6 weeks after surgery.  Continue to do coughing and deep breathing exercises once you get home. This will help to prevent pneumonia.  Do not do strenuous activities, such as heavy lifting, pushing, or pulling,  until after your follow-up visit with your caregiver. Do not lift anything heavier than 10 lb (4.5 kg).  Talk with your caregiver about when you may return to work and your exercise routine.  Do not drive while taking prescription pain medicine. Nutrition  It is very important that you drink at least 80 oz (2,400 mL) of fluid a day.  You should stay on a clear liquid diet until your follow-up visit with your caregiver. Keep sugar-free, clear liquid items on hand, including:  Tea: hot or cold.  Drink only decaffeinated for the first month.  Broths: clear beef, chicken, vegetable.  Others: water, sugar-free frozen ice pops, flavored water, gelatin (after 1 week).  Do not consume caffeine for 1 month. Large amounts of caffeine can cause dehydration.  A dietician may also give you specific instructions.  Follow your caregiver's recommendations about vitamins and protein requirements after surgery. Hygiene  You may shower and wash your hair 2 days after surgery. Pat incisions dry. Do not rub incisions with a washcloth or towel.  Follow your caregiver's recommendations about baths and pools following surgery. Pain control  If a prescription medicine was given, follow your caregiver's directions.  You may feel some gas pain caused by the carbon dioxide used to inflate your abdomen during surgery. This pain can be felt in your chest, shoulder, back, or abdominal area. Moving around often is advised. Incision care  You may have 4 or more small incisions. They are closed with skin adhesive strips. Skin adhesive strips can get wet and will fall off on their own. Check your incisions and surrounding area daily for any redness, swelling, discoloration, fluid (drainage), or bleeding. Dark red, dried blood may appear under these coverings. This is normal.  If you have a drain, it will be removed at your follow-up visit or before you leave the hospital.  If your drain is left in, follow your caregiver's instructions on drain care.  If your drain is taken out, keep a clean, dry bandage over the drain site. SEEK MEDICAL CARE IF:   You develop persistent nausea and vomiting.  You have pain and discomfort with swallowing.  You have pain, swelling, or warmth in the lower extremities.  You have an oral temperature above 102 F (38.9 C).  You develop chills.  Your incision sites look red, swollen, or have drainage.  Your stool is black, tarry, or maroon in color.  You are  lightheaded when standing.  You notice a bruise getting larger.  You have any questions or concerns. SEEK IMMEDIATE MEDICAL CARE IF:   You have chest pain.  You have severe calf pain or pain not relieved by medicine.  You develop shortness of breath or difficulty breathing.  There is bright red blood coming from the drain.  You feel confused.  You have slurred speech.  You suddenly feel weak. MAKE SURE YOU:   Understand these instructions.  Will watch your condition.  Will get help right away if you are not doing well or get worse. Document Released: 07/09/2004 Document Revised: 02/17/2012 Document Reviewed: 04/17/2010 [facility] Patient Information 2013 [place], [place].

## 2013-01-01 LAB — MICROALBUMIN, URINE: Microalb, Ur: 1.09 mg/dL (ref 0.00–1.89)

## 2013-01-10 ENCOUNTER — Other Ambulatory Visit: Payer: Self-pay | Admitting: Family Medicine

## 2013-02-20 ENCOUNTER — Ambulatory Visit (INDEPENDENT_AMBULATORY_CARE_PROVIDER_SITE_OTHER): Payer: Medicare Other | Admitting: Family Medicine

## 2013-02-20 VITALS — BP 132/88 | HR 73 | Temp 98.3°F | Resp 16 | Ht 61.0 in | Wt 279.0 lb

## 2013-02-20 DIAGNOSIS — H6690 Otitis media, unspecified, unspecified ear: Secondary | ICD-10-CM

## 2013-02-20 DIAGNOSIS — E119 Type 2 diabetes mellitus without complications: Secondary | ICD-10-CM | POA: Diagnosis not present

## 2013-02-20 DIAGNOSIS — G40209 Localization-related (focal) (partial) symptomatic epilepsy and epileptic syndromes with complex partial seizures, not intractable, without status epilepticus: Secondary | ICD-10-CM

## 2013-02-20 DIAGNOSIS — H6692 Otitis media, unspecified, left ear: Secondary | ICD-10-CM

## 2013-02-20 LAB — POCT CBC
Granulocyte percent: 44.8 %G (ref 37–80)
HCT, POC: 36.3 % — AB (ref 37.7–47.9)
Hemoglobin: 10.8 g/dL — AB (ref 12.2–16.2)
Lymph, poc: 2.1 (ref 0.6–3.4)
MCH, POC: 28.6 pg (ref 27–31.2)
MCHC: 29.8 g/dL — AB (ref 31.8–35.4)
MCV: 96.1 fL (ref 80–97)
MID (cbc): 0.2 (ref 0–0.9)
MPV: 9.1 fL (ref 0–99.8)
POC Granulocyte: 1.9 — AB (ref 2–6.9)
POC LYMPH PERCENT: 49.7 %L (ref 10–50)
POC MID %: 5.5 %M (ref 0–12)
Platelet Count, POC: 242 10*3/uL (ref 142–424)
RBC: 3.78 M/uL — AB (ref 4.04–5.48)
RDW, POC: 14.5 %
WBC: 4.3 10*3/uL — AB (ref 4.6–10.2)

## 2013-02-20 LAB — COMPREHENSIVE METABOLIC PANEL
ALT: 23 U/L (ref 0–35)
AST: 18 U/L (ref 0–37)
Albumin: 3.7 g/dL (ref 3.5–5.2)
Alkaline Phosphatase: 154 U/L — ABNORMAL HIGH (ref 39–117)
BUN: 13 mg/dL (ref 6–23)
CO2: 28 mEq/L (ref 19–32)
Calcium: 8.6 mg/dL (ref 8.4–10.5)
Chloride: 103 mEq/L (ref 96–112)
Creat: 0.84 mg/dL (ref 0.50–1.10)
Glucose, Bld: 101 mg/dL — ABNORMAL HIGH (ref 70–99)
Potassium: 4.2 mEq/L (ref 3.5–5.3)
Sodium: 137 mEq/L (ref 135–145)
Total Bilirubin: 0.3 mg/dL (ref 0.3–1.2)
Total Protein: 7.1 g/dL (ref 6.0–8.3)

## 2013-02-20 LAB — PHENYTOIN LEVEL, TOTAL: Phenytoin Lvl: 8.8 ug/mL — ABNORMAL LOW (ref 10.0–20.0)

## 2013-02-20 LAB — POCT GLYCOSYLATED HEMOGLOBIN (HGB A1C): Hemoglobin A1C: 5.9

## 2013-02-20 MED ORDER — CEFDINIR 300 MG PO CAPS: 600.0000 mg | ORAL_CAPSULE | Freq: Every day | ORAL | Status: DC

## 2013-02-20 NOTE — Progress Notes (Signed)
42 yo seizure patient who is homeless.  She reports an episode of lip smacking and unresponsiveness about 3 am.  No loss of bowel or bladder.  No lip laceration.  She feels a little disconnected and floating.  She took an extra 2 dilantin.    She normally takes Dilantin 100 mg 2 tabs twice a day.  At night she usually takes Dilantin 50 infatab at night.  Objective:  NAD Oroph:  Atraumatic Neck: supple CN III-XII: intact Reflexes:  Normal BJ Motor:  Symmetrical and normal  Left ear shows yellow wet meniscus behind the TM and she has decreased hearing there.  Seizure disorder, complex partial - Plan: POCT CBC, Comprehensive metabolic panel, Phenytoin level, total  Otitis media, left - Plan: cefdinir (OMNICEF) 300 MG capsule  Type 2 diabetes mellitus not at goal  If labs okay, will refer to neurology.

## 2013-02-22 ENCOUNTER — Telehealth: Payer: Self-pay

## 2013-02-22 NOTE — Telephone Encounter (Signed)
Patient advised to increase Dilantin infatabs to 2 at bedtime.

## 2013-02-22 NOTE — Telephone Encounter (Signed)
PT STATES WE CALLED THE OTHER DAY WITH HER LAB RESULTS. PLEASE CALL [phone]

## 2013-02-24 ENCOUNTER — Encounter: Payer: Self-pay | Admitting: Family Medicine

## 2013-02-24 ENCOUNTER — Ambulatory Visit (INDEPENDENT_AMBULATORY_CARE_PROVIDER_SITE_OTHER): Payer: Medicare Other | Admitting: Family Medicine

## 2013-02-24 VITALS — BP 134/82 | HR 66 | Temp 98.1°F | Resp 16 | Ht 60.5 in | Wt 280.0 lb

## 2013-02-24 DIAGNOSIS — R609 Edema, unspecified: Secondary | ICD-10-CM

## 2013-02-24 DIAGNOSIS — L259 Unspecified contact dermatitis, unspecified cause: Secondary | ICD-10-CM

## 2013-02-24 DIAGNOSIS — E119 Type 2 diabetes mellitus without complications: Secondary | ICD-10-CM | POA: Diagnosis not present

## 2013-02-24 DIAGNOSIS — G40909 Epilepsy, unspecified, not intractable, without status epilepticus: Secondary | ICD-10-CM

## 2013-02-24 DIAGNOSIS — H6590 Unspecified nonsuppurative otitis media, unspecified ear: Secondary | ICD-10-CM

## 2013-02-24 DIAGNOSIS — Z0000 Encounter for general adult medical examination without abnormal findings: Secondary | ICD-10-CM

## 2013-02-24 DIAGNOSIS — E1169 Type 2 diabetes mellitus with other specified complication: Secondary | ICD-10-CM

## 2013-02-24 DIAGNOSIS — E669 Obesity, unspecified: Secondary | ICD-10-CM

## 2013-02-24 DIAGNOSIS — L309 Dermatitis, unspecified: Secondary | ICD-10-CM

## 2013-02-24 MED ORDER — PHENYTOIN SODIUM EXTENDED 100 MG PO CAPS: 200.0000 mg | ORAL_CAPSULE | Freq: Two times a day (BID) | ORAL | Status: DC

## 2013-02-24 MED ORDER — SITAGLIPTIN PHOSPHATE 50 MG PO TABS: 50.0000 mg | ORAL_TABLET | Freq: Every day | ORAL | Status: DC

## 2013-02-24 MED ORDER — TRIAMCINOLONE ACETONIDE 0.1 % EX CREA: TOPICAL_CREAM | Freq: Two times a day (BID) | CUTANEOUS | Status: DC

## 2013-02-24 MED ORDER — FUROSEMIDE 40 MG PO TABS: 40.0000 mg | ORAL_TABLET | Freq: Every day | ORAL | Status: DC

## 2013-02-24 NOTE — Progress Notes (Signed)
42 yo woman with seizure disorder, diabetes, and otitis media.  She is still not hearing well out of either ear, particularly the left.  Nocturia persists. No further seizures. C/o rash under nose. C/o loose stools  Objective:  NAD  Results for orders placed in visit on 02/20/13  COMPREHENSIVE METABOLIC PANEL      Result Value Range   Sodium 137  135 - 145 mEq/L   Potassium 4.2  3.5 - 5.3 mEq/L   Chloride 103  96 - 112 mEq/L   CO2 28  19 - 32 mEq/L   Glucose, Bld 101 (*) 70 - 99 mg/dL   BUN 13  6 - 23 mg/dL   Creat 1.61  0.96 - 0.45 mg/dL   Total Bilirubin 0.3  0.3 - 1.2 mg/dL   Alkaline Phosphatase 154 (*) 39 - 117 U/L   AST 18  0 - 37 U/L   ALT 23  0 - 35 U/L   Total Protein 7.1  6.0 - 8.3 g/dL   Albumin 3.7  3.5 - 5.2 g/dL   Calcium 8.6  8.4 - 40.9 mg/dL  PHENYTOIN LEVEL, TOTAL      Result Value Range   Phenytoin Lvl 8.8 (*) 10.0 - 20.0 ug/mL  POCT CBC      Result Value Range   WBC 4.3 (*) 4.6 - 10.2 K/uL   Lymph, poc 2.1  0.6 - 3.4   POC LYMPH PERCENT 49.7  10 - 50 %L   MID (cbc) 0.2  0 - 0.9   POC MID % 5.5  0 - 12 %M   POC Granulocyte 1.9 (*) 2 - 6.9   Granulocyte percent 44.8  37 - 80 %G   RBC 3.78 (*) 4.04 - 5.48 M/uL   Hemoglobin 10.8 (*) 12.2 - 16.2 g/dL   HCT, POC 81.1 (*) 91.4 - 47.9 %   MCV 96.1  80 - 97 fL   MCH, POC 28.6  27 - 31.2 pg   MCHC 29.8 (*) 31.8 - 35.4 g/dL   RDW, POC 78.2     Platelet Count, POC 242  142 - 424 K/uL   MPV 9.1  0 - 99.8 fL  POCT GLYCOSYLATED HEMOGLOBIN (HGB A1C)      Result Value Range   Hemoglobin A1C 5.9     Heart:  Reg, no murmur Chest:  Clear Ext:  1+ edema Skin:  Scaly erythema nasolabial folds  Assessment:  Good diabetic control, low dilatin level, persistent otitis, mild edema  Plan: Nonsuppurative otitis media, not specified as acute or chronic - Plan: Ambulatory referral to ENT  Diabetes mellitus type 2 in obese - Plan: sitaGLIPtin (JANUVIA) 50 MG tablet  Edema - Plan: furosemide (LASIX) 40 MG  tablet  Seizure disorder - Plan: phenytoin (DILANTIN) 100 MG ER capsule  Eczema - Plan: triamcinolone cream (KENALOG) 0.1 %  Try holding the metformin to see if it is causing the diarrhea.   Call back on Saturday.  Set up CPE exam for next month

## 2013-03-02 ENCOUNTER — Telehealth: Payer: Self-pay

## 2013-03-02 NOTE — Telephone Encounter (Signed)
PATIENT STATES SHE DOES NOT HAVE ANYMORE OF HER PHENYTOIN 50MG . DR. Milus Glazier INCREASED HER TO TAKE IT 2 AT BEDTIME INSTEAD OF 1. NOW THE PHARMACIST SAYS SHE IS GETTING IT FILLED TOO EARLY. PLEASE CALL IT INTO THE PHARMACY FOR HER. BEST PHONE [phone] (HOME)   PHARMACY CHOICE IS [facility] ON [address].   MBC

## 2013-03-02 NOTE — Telephone Encounter (Signed)
Please advise 

## 2013-03-03 ENCOUNTER — Other Ambulatory Visit: Payer: Self-pay | Admitting: Family Medicine

## 2013-03-03 MED ORDER — PHENYTOIN 50 MG PO CHEW: 100.0000 mg | CHEWABLE_TABLET | Freq: Every day | ORAL | Status: DC

## 2013-03-09 ENCOUNTER — Encounter: Payer: Self-pay | Admitting: Neurology

## 2013-03-09 ENCOUNTER — Ambulatory Visit (INDEPENDENT_AMBULATORY_CARE_PROVIDER_SITE_OTHER): Payer: Medicare Other | Admitting: Neurology

## 2013-03-09 VITALS — BP 133/84 | HR 80 | Ht 61.0 in | Wt 284.0 lb

## 2013-03-09 DIAGNOSIS — R51 Headache: Secondary | ICD-10-CM | POA: Diagnosis not present

## 2013-03-09 DIAGNOSIS — G40309 Generalized idiopathic epilepsy and epileptic syndromes, not intractable, without status epilepticus: Secondary | ICD-10-CM | POA: Diagnosis not present

## 2013-03-09 DIAGNOSIS — R569 Unspecified convulsions: Secondary | ICD-10-CM | POA: Diagnosis not present

## 2013-03-09 DIAGNOSIS — G4733 Obstructive sleep apnea (adult) (pediatric): Secondary | ICD-10-CM

## 2013-03-09 DIAGNOSIS — Z5181 Encounter for therapeutic drug level monitoring: Secondary | ICD-10-CM

## 2013-03-09 NOTE — Patient Instructions (Signed)
  We will check a dilantin level today. I will send you to a sleep doctor for re-evaluation of the CPAP machine.

## 2013-03-09 NOTE — Progress Notes (Signed)
Reason for visit: Seizures  Brandi Gomez is an 42 y.o. female  History of present illness:  Brandi Gomez is a 42 year old right-handed black female with a history of morbid obesity and intractable seizures. The patient has partial complex seizures associated with staring and lip smacking. The patient last had a seizure about 2 weeks ago. The patient was supposed to be on 550 mg of Dilantin daily, but she was actually only on 200 mg twice daily. The patient has been increased to 500 mg daily. Most recent Dilantin levels were 8.8. The patient returns to this office for an evaluation. The patient indicates that she is getting generic Dilantin. The patient goes on to say that she is on CPAP for obstructive sleep apnea. The patient wakes up frequently with headaches, and the headaches go away rapidly when she is fully awake. The patient returns to this office for an evaluation. She is not operating motor vehicle.   Past Medical History  Diagnosis Date  . Type II or unspecified type diabetes mellitus without mention of complication, not stated as uncontrolled   . Hypertension   . Sleep apnea   . Anxiety   . Depression   . Bipolar 1 disorder   . Seizures     intractable  . Obesity   . Mild mental retardation     Past Surgical History  Procedure Laterality Date  . Nasal sinus surgery    . Myringotomy with tube placement      Family History  Problem Relation Age of Onset  . Diabetes Mother     passed away from accidental death  . Mental illness Father   . Diabetes Daughter     Social history:  reports that she has never smoked. She has never used smokeless tobacco. She reports that she does not drink alcohol or use illicit drugs.  Allergies:  Allergies  Allergen Reactions  . Hydrocodone Other (See Comments)    Depressed     Medications:  Current Outpatient Prescriptions on File Prior to Visit  Medication Sig Dispense Refill  . citalopram (CELEXA) 20 MG tablet TAKE 2  TABLETS BY MOUTH EVERY DAY FOR DEPRESSION  60 tablet  0  . furosemide (LASIX) 40 MG tablet Take 1 tablet (40 mg total) by mouth daily. For high blood pressure control and fluid build up control  30 tablet  11  . ibuprofen (ADVIL,MOTRIN) 200 MG tablet Take 200 mg by mouth every 6 (six) hours as needed.      . metFORMIN (GLUCOPHAGE) 500 MG tablet Take 1 tablet (500 mg total) by mouth 2 (two) times daily with a meal. For control of blood sugar  180 tablet  3  . phenytoin (DILANTIN INFATABS) 50 MG tablet Chew 2 tablets (100 mg total) by mouth at bedtime. For seizure control  60 tablet  11  . phenytoin (DILANTIN) 100 MG ER capsule Take 2 capsules (200 mg total) by mouth 2 (two) times daily. For seizure control  120 capsule  11  . sitaGLIPtin (JANUVIA) 50 MG tablet Take 1 tablet (50 mg total) by mouth daily. For control of blood sugar  30 tablet  5  . triamcinolone cream (KENALOG) 0.1 % Apply topically 2 (two) times daily.  30 g  0   No current facility-administered medications on file prior to visit.    ROS:  Out of a complete 14 system review of symptoms, the patient complains only of the following symptoms, and all other reviewed systems are negative.  Weight gain, fatigue Hearing loss Itching Diarrhea Increased thirst Memory loss, confusion, headache, numbness, seizures Anxiety, not enough sleep, decreased energy  Blood pressure 133/84, pulse 80, height 5\' 1"  (1.549 m), weight 284 lb (128.822 kg), last menstrual period 02/16/2013.  Physical Exam  General: The patient is alert and cooperative at the time of the examination. The patient is morbidly obese.  Skin: No significant peripheral edema is noted.   Neurologic Exam  Cranial nerves: Facial symmetry is present. Speech is normal, no aphasia or dysarthria is noted. Extraocular movements are full. Visual fields are full.  Motor: The patient has good strength in all 4 extremities.  Coordination: The patient has good  finger-nose-finger and heel-to-shin bilaterally.  Gait and station: The patient has a normal gait. Tandem gait is minimally unsteady. Romberg is negative. No drift is seen.  Reflexes: Deep tendon reflexes are symmetric.   Assessment/Plan:  One. Intractable seizures  2. Obstructive sleep apnea on CPAP  The patient indicates that she is not actively being followed on her CPAP. The patient will be referred to a sleep specialist through our office. The patient is waking up with early morning headaches, and she may not be getting maximal benefit from her CPAP machine. This may have an adverse effect on her seizure control. The patient will have a Dilantin level checked today, and the dosing will be readjusted accordingly. The patient may require a second seizure medication to help control her seizures. The patient will followup in 4 or 5 months.  Marlan Palau MD 03/09/2013 4:48 PM  [facility] [address] [place], [place] [address]  Phone [phone] Fax [phone]

## 2013-03-10 ENCOUNTER — Telehealth: Payer: Self-pay | Admitting: Neurology

## 2013-03-10 ENCOUNTER — Other Ambulatory Visit: Payer: Self-pay | Admitting: Physician Assistant

## 2013-03-10 DIAGNOSIS — G40909 Epilepsy, unspecified, not intractable, without status epilepticus: Secondary | ICD-10-CM

## 2013-03-10 MED ORDER — PHENYTOIN 50 MG PO CHEW: 100.0000 mg | CHEWABLE_TABLET | Freq: Every morning | ORAL | Status: DC

## 2013-03-10 MED ORDER — PHENYTOIN SODIUM EXTENDED 100 MG PO CAPS: ORAL_CAPSULE | ORAL | Status: DC

## 2013-03-10 NOTE — Telephone Encounter (Signed)
I called patient. The Dilantin level is 11. I will increase the Dilantin dosing by 50 mg daily. The patient will go to 250 mg in the morning, 300 mg in the evening. I discussed this with the patient. I'll call in prescriptions that reflect that.

## 2013-03-24 ENCOUNTER — Telehealth: Payer: Self-pay | Admitting: Neurology

## 2013-03-24 NOTE — Telephone Encounter (Signed)
Dr. Lesia Sago is referring patient for evaluation of OSA.  Ht. - 5'1" Wt. - 284 lbs. BMI - 53.69  OSA Morbid Obesity Hypertension  Snoring Excessive Daytime Sleepiness  Medication List Citalopram 20 MG Furosemide 40 MG Ibuprofen 200 MG Metformin 500 MG Phenytoin 50 MG Phenytoin 100 MG Sitagliptin 50 MG Trazodone 100 MG Triamcinolone Cream 0.1%  Patient has a history of OSA, on CPAP, not being actively managed.  States her previous sleep study was years ago, more than 3.  She is not sure where she had the study.  She has intractable seizures, and she reports early morning headache even on CPAP.  Reports very loud snoring .  Has high degree of daytime sleepiness and fatigue.

## 2013-03-25 ENCOUNTER — Other Ambulatory Visit: Payer: Self-pay | Admitting: Neurology

## 2013-03-25 DIAGNOSIS — G4733 Obstructive sleep apnea (adult) (pediatric): Secondary | ICD-10-CM

## 2013-03-27 ENCOUNTER — Telehealth: Payer: Self-pay

## 2013-03-27 NOTE — Telephone Encounter (Signed)
Pt is wanting to talk with someone to see if she needs to come in -she is having trouble with her legs   Best number [phone]

## 2013-03-29 NOTE — Telephone Encounter (Signed)
What is happening with her legs? Called her. They feel warm, bones feel cold. Her legs are swelling, advised her to come in today to see Dr Milus Glazier.

## 2013-03-31 ENCOUNTER — Ambulatory Visit (INDEPENDENT_AMBULATORY_CARE_PROVIDER_SITE_OTHER): Payer: Medicare Other

## 2013-03-31 DIAGNOSIS — G4710 Hypersomnia, unspecified: Secondary | ICD-10-CM | POA: Diagnosis not present

## 2013-03-31 DIAGNOSIS — G4733 Obstructive sleep apnea (adult) (pediatric): Secondary | ICD-10-CM | POA: Diagnosis not present

## 2013-03-31 DIAGNOSIS — R0989 Other specified symptoms and signs involving the circulatory and respiratory systems: Secondary | ICD-10-CM | POA: Diagnosis not present

## 2013-03-31 DIAGNOSIS — R0609 Other forms of dyspnea: Secondary | ICD-10-CM

## 2013-04-02 ENCOUNTER — Other Ambulatory Visit: Payer: Self-pay | Admitting: Neurology

## 2013-04-02 DIAGNOSIS — G4733 Obstructive sleep apnea (adult) (pediatric): Secondary | ICD-10-CM

## 2013-04-12 ENCOUNTER — Ambulatory Visit (INDEPENDENT_AMBULATORY_CARE_PROVIDER_SITE_OTHER): Payer: Medicare Other | Admitting: Family Medicine

## 2013-04-12 ENCOUNTER — Encounter: Payer: Self-pay | Admitting: Family Medicine

## 2013-04-12 DIAGNOSIS — R609 Edema, unspecified: Secondary | ICD-10-CM

## 2013-04-12 DIAGNOSIS — R600 Localized edema: Secondary | ICD-10-CM

## 2013-04-12 MED ORDER — TORSEMIDE 20 MG PO TABS: 20.0000 mg | ORAL_TABLET | Freq: Every day | ORAL | Status: DC

## 2013-04-12 NOTE — Progress Notes (Signed)
42 yo obese woman who comes in complaining of increasing edema.  She is taking her medicine as ordered, but the swelling increases.  No chest pain or shortness of breath  Objective:  Obese woman in NAD Chest:  Clear Extrem:  Nontender, 2+ pitting edema bilaterally up to knees  Assessment:  Worsening edema  Plan:  Change lasix to torsemide, Jobst stockings.

## 2013-04-15 ENCOUNTER — Encounter: Payer: Self-pay | Admitting: Family Medicine

## 2013-04-15 ENCOUNTER — Telehealth: Payer: Self-pay | Admitting: *Deleted

## 2013-04-15 ENCOUNTER — Ambulatory Visit (INDEPENDENT_AMBULATORY_CARE_PROVIDER_SITE_OTHER): Payer: Medicare Other | Admitting: Family Medicine

## 2013-04-15 DIAGNOSIS — H6520 Chronic serous otitis media, unspecified ear: Secondary | ICD-10-CM | POA: Diagnosis not present

## 2013-04-15 MED ORDER — AMOXICILLIN-POT CLAVULANATE 875-125 MG PO TABS: 1.0000 | ORAL_TABLET | Freq: Two times a day (BID) | ORAL | Status: DC

## 2013-04-15 NOTE — Patient Instructions (Signed)

## 2013-04-15 NOTE — Telephone Encounter (Signed)
spk with [name] and discussed sleep study results of recent split night test.  Gave info for both diagnostic portion of study and CPAP titration portion, also discussed PLMD and follow up with Dr. Frances Furbish.  Pt had no questions, [name] had no questions.  They will call back if any questions come up. -S. Honora Searson, RPSGT

## 2013-04-15 NOTE — Progress Notes (Signed)
Patient ID: Brandi Gomez MRN: [id number], DOB: 1971/07/04, 42 y.o. Date of Encounter: 04/15/2013, 11:13 AM  Primary Physician: Elvina Sidle, MD  Chief Complaint:  Chief Complaint  Patient presents with  . Otalgia    left ear bleeding  . Sinusitis    HPI: 42 y.o. year old female presents with 5 day history of otalgia. Symptoms began with nasal congestion, sore throat, and cough. Afebrile. Nasal congestion thick and green/yellow. Cough is  Sounds productive. Ear painful and  feels full, leading to sensation of muffled hearing. No drainage or discharge from affected ear. Has tried OTC cold preps without success. No GI complaints. Appetite decreased  No sick contacts, recent antibiotics, or recent travels.   Here with   Past Medical History  Diagnosis Date  . Type II or unspecified type diabetes mellitus without mention of complication, not stated as uncontrolled   . Hypertension   . Sleep apnea   . Anxiety   . Depression   . Bipolar 1 disorder   . Seizures     intractable  . Obesity   . Mild mental retardation      Home Meds: Prior to Admission medications   Medication Sig Start Date End Date Taking? Authorizing Provider  citalopram (CELEXA) 20 MG tablet TAKE 2 TABLETS BY MOUTH EVERY DAY FOR DEPRESSION 03/10/13  Yes Heather M Marte, PA-C  ibuprofen (ADVIL,MOTRIN) 200 MG tablet Take 200 mg by mouth every 6 (six) hours as needed.   Yes Historical Provider, MD  phenytoin (DILANTIN INFATABS) 50 MG tablet Chew 2 tablets (100 mg total) by mouth every morning. For seizure control 03/10/13  Yes York Spaniel, MD  phenytoin (DILANTIN) 100 MG ER capsule 2 capsules in the morning, 3 capsules in the evening. Brand is medically necessary. 03/10/13  Yes York Spaniel, MD  sitaGLIPtin (JANUVIA) 50 MG tablet Take 1 tablet (50 mg total) by mouth daily. For control of blood sugar 02/24/13  Yes Elvina Sidle, MD  torsemide (DEMADEX) 20 MG tablet Take 1 tablet (20 mg total) by mouth  daily. 04/12/13  Yes Elvina Sidle, MD  traZODone (DESYREL) 100 MG tablet Take 100 mg by mouth at bedtime.   Yes Historical Provider, MD  triamcinolone cream (KENALOG) 0.1 % Apply topically 2 (two) times daily. 02/24/13  Yes Elvina Sidle, MD  amoxicillin-clavulanate (AUGMENTIN) 875-125 MG per tablet Take 1 tablet by mouth 2 (two) times daily. 04/15/13   Elvina Sidle, MD  metFORMIN (GLUCOPHAGE) 500 MG tablet Take 1 tablet (500 mg total) by mouth 2 (two) times daily with a meal. For control of blood sugar 11/19/12   Elvina Sidle, MD    Allergies:  Allergies  Allergen Reactions  . Hydrocodone Other (See Comments)    Depressed     History   Social History  . Marital Status: Single    Spouse Name: N/A    Number of Children: N/A  . Years of Education: N/A   Occupational History  . disabled    Social History Main Topics  . Smoking status: Never Smoker   . Smokeless tobacco: Never Used  . Alcohol Use: No  . Drug Use: No  . Sexually Active: Not on file   Other Topics Concern  . Not on file   Social History Narrative  . No narrative on file     Review of Systems: Constitutional: negative for chills, fever, night sweats or weight changes HEENT: see above Respiratory: negative for hemoptysis, wheezing, or shortness of breath Abdominal:  negative for abdominal pain, nausea, vomiting or diarrhea Dermatological: negative for rash Neurologic: negative for headache   Physical Exam: Blood pressure 142/92, pulse 83, temperature 98.3 F (36.8 C), temperature source Oral, resp. rate 18, height 5' (1.524 m), weight 282 lb 12.8 oz (128.277 kg), last menstrual period 04/12/2013, SpO2 97.00%., Body mass index is 55.23 kg/(m^2). General: Well developed, well nourished, in no acute distress. Head: Normocephalic, atraumatic, eyes without discharge, sclera non-icteric, nares are congested. Bilateral auditory canals clear. both TM erythematous, dull, and bulging with purulent effusion  behind.  perforation on rightvisualized. Contralateral TM pearly grey with marked scarring. Oral cavity moist, dentition normal. Posterior pharynx with post nasal drip and mild erythema. No peritonsillar abscess or tonsillar exudate. Neck: Supple. No thyromegaly. Full ROM. No lymphadenopathy. Lungs: Clear bilaterally to auscultation without wheezes, rales, or rhonchi. Breathing is unlabored.  Heart: RRR with S1 S2. No murmurs, rubs, or gallops appreciated. Abdomen: Soft, non-tender, non-distended with normoactive bowel sounds. No hepatosplenomegaly. No rebound/guarding. No obvious abdominal masses. McBurney's, Rovsing's, Iliopsoas, and table jar all negative. Msk:  Strength and tone normal for age. Extremities: No clubbing or cyanosis. No edema. Neuro: Alert and oriented X 3. Moves all extremities spontaneously. CNII-XII grossly in tact. Psych:  Responds to questions appropriately with a normal affect.     ASSESSMENT AND PLAN:  42 y.o. year old female with chronic otitis media of both ear with perforation. Chronic serous OM (otitis media), bilateral - Plan: Ambulatory referral to ENT, amoxicillin-clavulanate (AUGMENTIN) 875-125 MG per tablet   - -Mucinex for children -Tylenol prn -Rest/fluids -RTC precautions -RTC 3 days if no improvement  Signed, Elvina Sidle, md 04/15/2013 11:13 AM

## 2013-04-28 ENCOUNTER — Ambulatory Visit (INDEPENDENT_AMBULATORY_CARE_PROVIDER_SITE_OTHER): Payer: Medicare Other | Admitting: Family Medicine

## 2013-04-28 VITALS — BP 132/86 | HR 96 | Temp 98.2°F | Resp 18 | Ht 60.5 in | Wt 280.0 lb

## 2013-04-28 DIAGNOSIS — Z880 Allergy status to penicillin: Secondary | ICD-10-CM | POA: Diagnosis not present

## 2013-04-28 DIAGNOSIS — H6590 Unspecified nonsuppurative otitis media, unspecified ear: Secondary | ICD-10-CM

## 2013-04-28 MED ORDER — PREDNISONE 20 MG PO TABS: 20.0000 mg | ORAL_TABLET | Freq: Every day | ORAL | Status: DC

## 2013-04-28 MED ORDER — LEVOFLOXACIN 500 MG PO TABS: 500.0000 mg | ORAL_TABLET | Freq: Every day | ORAL | Status: DC

## 2013-04-28 NOTE — Patient Instructions (Addendum)
Otitis Media with Effusion Otitis media with effusion is the presence of fluid in the middle ear. This is a common problem that often follows ear infections. It may be present for weeks or longer after the infection. Unlike an acute ear infection, otits media with effusion refers only to fluid behind the ear drum and not infection. Children with repeated ear and sinus infections and allergy problems are the most likely to get otitis media with effusion. CAUSES  The most frequent cause of the fluid buildup is dysfunction of the eustacian tubes. These are the tubes that drain fluid in the ears to the throat. SYMPTOMS   The main symptom of this condition is hearing loss. As a result, you or your child may:  Listen to the TV at a loud volume.  Not respond to questions.  Ask "what" often when spoken to.  There may be a sensation of fullness or pressure but usually not pain. DIAGNOSIS   Your caregiver will diagnose this condition by examining you or your child's ears.  Your caregiver may test the pressure in you or your child's ear with a tympanometer.  A hearing test may be conducted if the problem persists.  A caregiver will want to re-evaluate the condition periodically to see if it improves. TREATMENT   Treatment depends on the duration and the effects of the effusion.  Antibiotics, decongestants, nose drops, and cortisone-type drugs may not be helpful.  Children with persistent ear effusions may have delayed language. Children at risk for developmental delays in hearing, learning, and speech may require referral to a specialist earlier than children not at risk.  You or your child's caregiver may suggest a referral to an Ear, Nose, and Throat (ENT) surgeon for treatment. The following may help restore normal hearing:  Drainage of fluid.  Placement of ear tubes (tympanostomy tubes).  Removal of adenoids (adenoidectomy). HOME CARE INSTRUCTIONS   Avoid second hand  smoke.  Infants who are breast fed are less likely to have this condition.  Avoid feeding infants while laying flat.  Avoid known environmental allergens.  Be sure to see a caregiver or an ENT specialist for follow up.  Avoid people who are sick. SEEK MEDICAL CARE IF:   Hearing is not better in 3 months.  Hearing is worse.  Ear pain.  Drainage from the ear.  Dizziness. Document Released: 01/02/2005 Document Revised: 02/17/2012 Document Reviewed: 04/17/2010 [employer] Patient Information 2014 [employer].  

## 2013-04-28 NOTE — Progress Notes (Signed)
42 yo with chronic OM, with increasing pain right ear and loss of hearing.  She took the Augmentin but this did not resolve the pain and hearing loss.  Pain radiates into left jaw  Also, she has been feeling like bugs are crawling on skin this last week.  Objective:  NAD Both  TM's have perforations Neck:  Supple Skin: clear Eyes:  injected  Assessment:  Chronic OM with amoxicillin reaction  Plan:   Prednisone and levaquin ENT referral  Signed, Elvina Sidle, MD

## 2013-05-05 DIAGNOSIS — H66019 Acute suppurative otitis media with spontaneous rupture of ear drum, unspecified ear: Secondary | ICD-10-CM | POA: Diagnosis not present

## 2013-05-25 ENCOUNTER — Encounter: Payer: Self-pay | Admitting: Family Medicine

## 2013-05-25 ENCOUNTER — Ambulatory Visit (INDEPENDENT_AMBULATORY_CARE_PROVIDER_SITE_OTHER): Payer: Medicare Other | Admitting: Family Medicine

## 2013-05-25 VITALS — BP 123/83 | HR 79 | Temp 97.9°F | Resp 18 | Ht 60.5 in | Wt 286.0 lb

## 2013-05-25 DIAGNOSIS — H9209 Otalgia, unspecified ear: Secondary | ICD-10-CM | POA: Diagnosis not present

## 2013-05-25 DIAGNOSIS — N907 Vulvar cyst: Secondary | ICD-10-CM

## 2013-05-25 DIAGNOSIS — R5381 Other malaise: Secondary | ICD-10-CM | POA: Diagnosis not present

## 2013-05-25 DIAGNOSIS — H7292 Unspecified perforation of tympanic membrane, left ear: Secondary | ICD-10-CM

## 2013-05-25 DIAGNOSIS — R5383 Other fatigue: Secondary | ICD-10-CM

## 2013-05-25 LAB — POCT CBC
Granulocyte percent: 70.9 %G (ref 37–80)
HCT, POC: 34 % — AB (ref 37.7–47.9)
Hemoglobin: 10.3 g/dL — AB (ref 12.2–16.2)
Lymph, poc: 1.9 (ref 0.6–3.4)
MCH, POC: 29.9 pg (ref 27–31.2)
MCHC: 30.3 g/dL — AB (ref 31.8–35.4)
MCV: 98.8 fL — AB (ref 80–97)
MID (cbc): 0.5 (ref 0–0.9)
MPV: 9.4 fL (ref 0–99.8)
POC Granulocyte: 5.8 (ref 2–6.9)
POC LYMPH PERCENT: 22.6 %L (ref 10–50)
POC MID %: 6.5 %M (ref 0–12)
Platelet Count, POC: 228 10*3/uL (ref 142–424)
RBC: 3.44 M/uL — AB (ref 4.04–5.48)
RDW, POC: 14.1 %
WBC: 8.2 10*3/uL (ref 4.6–10.2)

## 2013-05-25 LAB — TSH: TSH: 1.05 u[IU]/mL (ref 0.350–4.500)

## 2013-05-25 MED ORDER — DOXYCYCLINE HYCLATE 100 MG PO TABS: 100.0000 mg | ORAL_TABLET | Freq: Two times a day (BID) | ORAL | Status: DC

## 2013-05-25 NOTE — Progress Notes (Signed)
42 yo woman with otalgia who saw ENT specialist 10 days ago and Cipro drops were prescribed and taken, without improvement.  Now the left sided teeth are sore.  She is feeling weak as well.  She has a bump on left labia for two days.  Never had such a sore before.  Objective:  NAD Perforated left ear drum with mild erythema Oroph: no obvious abscess Neck: mild left AC nodes Chest: clear Left labia:  5 mm swelling, tenderness Results for orders placed in visit on 05/25/13  POCT CBC      Result Value Range   WBC 8.2  4.6 - 10.2 K/uL   Lymph, poc 1.9  0.6 - 3.4   POC LYMPH PERCENT 22.6  10 - 50 %L   MID (cbc) 0.5  0 - 0.9   POC MID % 6.5  0 - 12 %M   POC Granulocyte 5.8  2 - 6.9   Granulocyte percent 70.9  37 - 80 %G   RBC 3.44 (*) 4.04 - 5.48 M/uL   Hemoglobin 10.3 (*) 12.2 - 16.2 g/dL   HCT, POC 47.8 (*) 29.5 - 47.9 %   MCV 98.8 (*) 80 - 97 fL   MCH, POC 29.9  27 - 31.2 pg   MCHC 30.3 (*) 31.8 - 35.4 g/dL   RDW, POC 62.1     Platelet Count, POC 228  142 - 424 K/uL   MPV 9.4  0 - 99.8 fL    Assessment:  Fatigue secondary to low iron, small labial cyst, left ear perforation  Fatigue - Plan: POCT CBC, TSH  Labial cyst  Ear drum perforation, left  Signed, Elvina Sidle, MD

## 2013-05-25 NOTE — Patient Instructions (Signed)
Take iron daily .

## 2013-06-02 DIAGNOSIS — H7200 Central perforation of tympanic membrane, unspecified ear: Secondary | ICD-10-CM | POA: Diagnosis not present

## 2013-06-02 DIAGNOSIS — R1310 Dysphagia, unspecified: Secondary | ICD-10-CM | POA: Diagnosis not present

## 2013-06-02 DIAGNOSIS — K219 Gastro-esophageal reflux disease without esophagitis: Secondary | ICD-10-CM | POA: Diagnosis not present

## 2013-06-10 ENCOUNTER — Encounter: Payer: Self-pay | Admitting: Neurology

## 2013-06-20 ENCOUNTER — Ambulatory Visit (INDEPENDENT_AMBULATORY_CARE_PROVIDER_SITE_OTHER): Payer: Medicare Other | Admitting: Family Medicine

## 2013-06-20 VITALS — BP 129/84 | HR 76 | Temp 98.1°F | Resp 16 | Ht 61.0 in | Wt 279.0 lb

## 2013-06-20 DIAGNOSIS — E119 Type 2 diabetes mellitus without complications: Secondary | ICD-10-CM

## 2013-06-20 DIAGNOSIS — L0291 Cutaneous abscess, unspecified: Secondary | ICD-10-CM

## 2013-06-20 DIAGNOSIS — L0390 Cellulitis, unspecified: Secondary | ICD-10-CM

## 2013-06-20 LAB — POCT CBC
Granulocyte percent: 63.4 %G (ref 37–80)
HCT, POC: 28.9 % — AB (ref 37.7–47.9)
Hemoglobin: 11.8 g/dL — AB (ref 12.2–16.2)
Lymph, poc: 2.2 (ref 0.6–3.4)
MCH, POC: 29.8 pg (ref 27–31.2)
MCHC: 30.3 g/dL — AB (ref 31.8–35.4)
MCV: 98.3 fL — AB (ref 80–97)
MID (cbc): 0.5 (ref 0–0.9)
MPV: 9.6 fL (ref 0–99.8)
POC Granulocyte: 4.7 (ref 2–6.9)
POC LYMPH PERCENT: 29.4 %L (ref 10–50)
POC MID %: 7.2 %M (ref 0–12)
Platelet Count, POC: 256 10*3/uL (ref 142–424)
RBC: 3.96 M/uL — AB (ref 4.04–5.48)
RDW, POC: 14.2 %
WBC: 7.4 10*3/uL (ref 4.6–10.2)

## 2013-06-20 LAB — GLUCOSE, POCT (MANUAL RESULT ENTRY): POC Glucose: 122 mg/dl — AB (ref 70–99)

## 2013-06-20 LAB — POCT GLYCOSYLATED HEMOGLOBIN (HGB A1C): Hemoglobin A1C: 6.6

## 2013-06-20 MED ORDER — DOXYCYCLINE HYCLATE 100 MG PO TABS: 100.0000 mg | ORAL_TABLET | Freq: Two times a day (BID) | ORAL | Status: DC

## 2013-06-20 NOTE — Progress Notes (Signed)
42 yo disabled woman with flea bites on her legs x 2 weeks after having moved to a new apartment.  The areas of the bites are sore and festering.  Yesterday she started vomiting.  No abdominal pain or diarrhea.  She has not been able to check sugar because she ran out of strips.   Obj:  NAD Chest: clear Heart:  Reg, no murmur Abdomen: soft, nontender Ext: bilateral red lesions on left leg with crusting on the central area Results for orders placed in visit on 06/20/13  GLUCOSE, POCT (MANUAL RESULT ENTRY)      Result Value Range   POC Glucose 122 (*) 70 - 99 mg/dl  POCT GLYCOSYLATED HEMOGLOBIN (HGB A1C)      Result Value Range   Hemoglobin A1C 6.6    POCT CBC      Result Value Range   WBC 7.4  4.6 - 10.2 K/uL   Lymph, poc 2.2  0.6 - 3.4   POC LYMPH PERCENT 29.4  10 - 50 %L   MID (cbc) 0.5  0 - 0.9   POC MID % 7.2  0 - 12 %M   POC Granulocyte 4.7  2 - 6.9   Granulocyte percent 63.4  37 - 80 %G   RBC 3.96 (*) 4.04 - 5.48 M/uL   Hemoglobin 11.8 (*) 12.2 - 16.2 g/dL   HCT, POC 16.1 (*) 09.6 - 47.9 %   MCV 98.3 (*) 80 - 97 fL   MCH, POC 29.8  27 - 31.2 pg   MCHC 30.3 (*) 31.8 - 35.4 g/dL   RDW, POC 04.5     Platelet Count, POC 256  142 - 424 K/uL   MPV 9.6  0 - 99.8 fL     Assessment:  Flea bites with some cellulitis, diabetes  Plan:  [facility] will call in for strips and lancets because patient ran out  Diabetes - Plan: POCT glucose (manual entry), POCT glycosylated hemoglobin (Hb A1C), POCT CBC  Cellulitis - Plan: POCT glucose (manual entry), POCT glycosylated hemoglobin (Hb A1C), POCT CBC  Signed, Elvina Sidle, MD

## 2013-06-26 ENCOUNTER — Telehealth: Payer: Self-pay

## 2013-06-26 NOTE — Telephone Encounter (Signed)
Pt thought she had missed something and thought that Dr. Elbert Ewings had sent out lab but I went over again what did here with her.

## 2013-06-26 NOTE — Telephone Encounter (Signed)
Patient is calling to get her lab results from her visit on 7/13.  Patient states that she is very anxious for these results.   Best#: [phone]

## 2013-07-02 ENCOUNTER — Encounter: Payer: Self-pay | Admitting: Neurology

## 2013-07-02 NOTE — Progress Notes (Signed)
Quick Note:  I reviewed the patient's CPAP compliance data from 04/27/2013 to 05/26/2013, which is a total of 30 days, during which time the patient used CPAP every day. The average usage for all days was 6 hours and 10 minutes. The percent used days greater than 4 hours was 97%, indicating excellent compliance. The residual AHI was 1.8 per hour, indicating a good treatment pressure of 10 cwp. I will review this data with the patient at the next visit, provide feedback and additional trouble shooting if need be.  Huston Foley, MD, PhD [facility] ([facility]   ______

## 2013-07-06 ENCOUNTER — Other Ambulatory Visit: Payer: Self-pay | Admitting: Physician Assistant

## 2013-07-10 ENCOUNTER — Encounter: Payer: Self-pay | Admitting: Neurology

## 2013-07-26 ENCOUNTER — Institutional Professional Consult (permissible substitution): Payer: Medicare Other | Admitting: Neurology

## 2013-07-26 ENCOUNTER — Telehealth: Payer: Self-pay | Admitting: Neurology

## 2013-07-28 ENCOUNTER — Institutional Professional Consult (permissible substitution): Payer: Medicare Other | Admitting: Neurology

## 2013-07-28 NOTE — Telephone Encounter (Signed)
I called pt and made appt for her to see Heide Guile, NP// Dr. Anne Hahn on site as well.  She will come in 08-02-13 at 1030.

## 2013-08-02 ENCOUNTER — Ambulatory Visit: Payer: Self-pay | Admitting: Nurse Practitioner

## 2013-08-03 ENCOUNTER — Institutional Professional Consult (permissible substitution): Payer: Medicare Other | Admitting: Neurology

## 2013-08-13 ENCOUNTER — Ambulatory Visit (INDEPENDENT_AMBULATORY_CARE_PROVIDER_SITE_OTHER): Payer: Medicare Other | Admitting: Neurology

## 2013-08-13 ENCOUNTER — Encounter: Payer: Self-pay | Admitting: Neurology

## 2013-08-13 VITALS — BP 127/79 | HR 71 | Temp 98.8°F | Ht 61.0 in | Wt 286.0 lb

## 2013-08-13 DIAGNOSIS — G4733 Obstructive sleep apnea (adult) (pediatric): Secondary | ICD-10-CM | POA: Diagnosis not present

## 2013-08-13 DIAGNOSIS — G40909 Epilepsy, unspecified, not intractable, without status epilepticus: Secondary | ICD-10-CM

## 2013-08-13 NOTE — Patient Instructions (Signed)
Please continue using your CPAP regularly. While your insurance requires that you use CPAP at least 4 hours each night on 70% of the nights, I recommend, that you not skip any nights and use it throughout the night if you can. Getting used to CPAP does take time and patience and discipline. Untreated obstructive sleep apnea when it is moderate to severe can have an adverse impact on cardiovascular health and raise her risk for heart disease, arrhythmias, hypertension, congestive heart failure, stroke and diabetes. Untreated obstructive sleep apnea causes sleep disruption, nonrestorative sleep, and sleep deprivation. This can have an impact on your day to day functioning and cause daytime sleepiness and impairment of cognitive function, memory loss, mood disturbance, and problems focussing. Using CPAP regularly can improve these symptoms.   

## 2013-08-13 NOTE — Progress Notes (Signed)
Subjective:    Patient ID: Brandi Gomez is a 42 y.o. female.  HPI  Huston Foley, MD, PhD [facility] [address] [address] [place], [place] [address]  Dear [name],   I saw your patient, Brandi Gomez, upon your kind request in my clinic today for initial consultation of her obstructive sleep apnea after a recent sleep study. The patient is accompanied by her fiance, [name], today. As you know, [name] is a very pleasant 42 year old right-handed woman with an underlying medical history of hypertension, diabetes, depression, morbid obesity, seizures, anxiety as well as a prior diagnosis of obstructive sleep apnea who has had problems tolerating her CPAP machine and suboptimal compliance. She had stopped using CPAP about 1-2 years ago. You had seen her on 03/09/2013, and which time she reported waking up frequently with headaches. She has a history of partial complex seizures manifested by staring and lipsmacking. Her last seizure was approximately 2 days ago and consisted of lip smacking and hand twitching per BF. She is on Dilantin for seizures. Her other medications are Celexa, Lasix, Advil, metformin, Januvia. She had a split-night sleep study on 03/31/2013 and I went over her test results with her and her BF in detail today. Sleep efficiency was at baseline reduced at 77.3% with a latency to sleep of 11.5 minutes and wake after sleep onset of 12 minutes. She had an arousal index of 21.3 arousals. She had a high normal percentage of stage I sleep, borderline increased percentage of stage II sleep, reduced percentage of deep sleep at 2.5% and markedly increased percentage of REM sleep at 31.9% with a mildly reduced REM latency of 65.5 minutes. She had no significant PLMs or cardiac arrhythmias. She had mild to moderate snoring and slept only in the right lateral position. She had a total of 34 obstructive apneas and 34 obstructive hypopneas, and her AHI was  52.3 per hour. Her baseline oxygen saturation was noted to be only 86%, her nadir was 66% and REM sleep. She was then titrated on CPAP on a pressure of 5-10 cm of water utilizing a nasal mask. Her AHI was reduced to 1.4 events per hour at 10 cm of pressure. Supine REM sleep was achieved on the final pressure. Based on the test results I prescribed CPAP for her. I also reviewed with her compliance data from 04/27/2013 through 05/26/2013 which is a total of 30 days, during which time she uses CPAP every day. Her percent used days greater than 4 hours was 97% indicating excellent compliance. Her average usage was 6 hours and 10 minutes and her residual AHI was 1.8, indicating a good treatment pressure of 10 cm of water with an EPR level of 2.  I also reviewed the most recent compliance data from her compliance to that she brought in from dates 05/15/2013 through 08/12/2013 which is a total of 90 days during which time she used it every day except for 6 days. Her percent used days greater than 4 hours was only 47% and her average usage for all days was 3 hours and 50 minutes only. Her residual AHI was 3.1 indicating again adequate treatment pressure of 10 cm with EPR of 2.  She reports struggling with the machine, but mainly because she is tired of using it. She actually did feel better restarting on CPAP, feeling more rested, less tired during. Her compliance dropped by 50% and her average usage also dropped to exactly 50% of where she was  in the first 30 days.  Her BF tries to remind her to use CPAP each night. She does not have a scheduled bed time or wake time routine and takes multiple cat naps during the day. He reports, that she has had memory issues, mostly forgetful.   Her Past Medical History Is Significant For: Past Medical History  Diagnosis Date  . Type II or unspecified type diabetes mellitus without mention of complication, not stated as uncontrolled   . Hypertension   . Sleep apnea   . Anxiety    . Depression   . Bipolar 1 disorder   . Seizures     intractable  . Obesity   . Mild mental retardation     Her Past Surgical History Is Significant For: Past Surgical History  Procedure Laterality Date  . Nasal sinus surgery    . Myringotomy with tube placement      Her Family History Is Significant For: Family History  Problem Relation Age of Onset  . Diabetes Mother     passed away from accidental death  . Mental illness Father   . Diabetes Daughter     Her Social History Is Significant For: History   Social History  . Marital Status: Single    Spouse Name: N/A    Number of Children: N/A  . Years of Education: N/A   Occupational History  . disabled    Social History Main Topics  . Smoking status: Never Smoker   . Smokeless tobacco: Never Used  . Alcohol Use: No  . Drug Use: No  . Sexual Activity: None   Other Topics Concern  . None   Social History Narrative  . None    Her Allergies Are:  Allergies  Allergen Reactions  . Amoxicillin Itching  . Hydrocodone Other (See Comments)    Depressed   :   Her Current Medications Are:  Outpatient Encounter Prescriptions as of 08/13/2013  Medication Sig Dispense Refill  . citalopram (CELEXA) 20 MG tablet Take 2 tablets (40 mg total) by mouth daily. PATIENT NEEDS OFFICE VISIT FOR ADDITIONAL REFILLS  60 tablet  1  . doxycycline (VIBRA-TABS) 100 MG tablet Take 1 tablet (100 mg total) by mouth 2 (two) times daily.  20 tablet  0  . ibuprofen (ADVIL,MOTRIN) 200 MG tablet Take 200 mg by mouth every 6 (six) hours as needed.      . phenytoin (DILANTIN) 100 MG ER capsule 2 capsules in the morning, 3 capsules in the evening. Brand is medically necessary.  120 capsule  11  . phenytoin (DILANTIN) 50 MG tablet Chew 50 mg by mouth 2 (two) times daily. For seizure control      . sitaGLIPtin (JANUVIA) 50 MG tablet Take 1 tablet (50 mg total) by mouth daily. For control of blood sugar  30 tablet  5  . torsemide (DEMADEX) 20 MG  tablet Take 1 tablet (20 mg total) by mouth daily.  30 tablet  5  . [DISCONTINUED] phenytoin (DILANTIN INFATABS) 50 MG tablet Chew 2 tablets (100 mg total) by mouth every morning. For seizure control  30 tablet  11   No facility-administered encounter medications on file as of 08/13/2013.  :  Review of Systems  Constitutional: Positive for fever, activity change and unexpected weight change.  HENT: Positive for hearing loss and tinnitus.   Respiratory:       Snoring  Cardiovascular: Positive for leg swelling.  Endocrine: Positive for heat intolerance.  Neurological: Positive for weakness and  numbness.       Memory loss  Hematological: Bruises/bleeds easily.  Psychiatric/Behavioral: Positive for confusion, sleep disturbance and dysphoric mood.    Objective:  Neurologic Exam  Physical Exam Physical Examination:   Filed Vitals:   08/13/13 1040  BP: 127/79  Pulse: 71  Temp: 98.8 F (37.1 C)    General Examination: The patient is a very pleasant 42 y.o. female in no acute distress. She appears well-developed and well-nourished and adequately groomed. She is morbidly obese.  HEENT: Normocephalic, atraumatic, pupils are equal, round and reactive to light and accommodation. Funduscopic exam is normal with sharp disc margins noted. Extraocular tracking is good without limitation to gaze excursion or nystagmus noted. Normal smooth pursuit is noted. Hearing is grossly intact. Tympanic membranes are: clear on the R and perforated on the L, not new, per pt. Face is symmetric with normal facial animation and normal facial sensation. Speech is clear with no dysarthria noted. There is no hypophonia. There is no lip, neck/head, jaw or voice tremor. Neck is supple with full range of passive and active motion. There are no carotid bruits on auscultation. Oropharynx exam reveals: mild mouth dryness, adequate dental hygiene and moderate airway crowding, due to redundant soft palate and larger tongue.  Mallampati is class II. Tongue protrudes centrally and palate elevates symmetrically. Neck is enlarged.    Chest: Clear to auscultation without wheezing, rhonchi or crackles noted.  Heart: S1+S2+0, regular and normal without murmurs, rubs or gallops noted.   Abdomen: Soft, non-tender and non-distended with normal bowel sounds appreciated on auscultation.  Extremities: There is trace pitting edema in the distal lower extremities bilaterally in her ankles. Pedal pulses are intact.  Skin: Warm and dry without trophic changes noted. There are no varicose veins.  Musculoskeletal: exam reveals no obvious joint deformities, tenderness or joint swelling or erythema.   Neurologically:  Mental status: The patient is awake, alert and oriented in all 4 spheres. Her memory, attention, language and knowledge are mildly impaired; there is mild psychomotor slowness. There is no aphasia, agnosia, apraxia or anomia. Speech is clear with normal prosody and enunciation. Thought process is linear. Mood is mildly depressed and affect is blunted.  Cranial nerves are as described above under HEENT exam. In addition, shoulder shrug is normal with equal shoulder height noted. Motor exam: Normal bulk, strength and tone is noted. There is no drift, tremor or rebound. Romberg shows swaying. Reflexes are 2-3+ throughout. Fine motor skills are intact with normal finger taps, normal hand movements, normal rapid alternating patting, normal foot taps and normal foot agility.  Cerebellar testing shows no dysmetria or intention tremor on finger to nose testing. There is no truncal or gait ataxia.  Sensory exam is intact to light touch, pinprick, vibration, temperature sense and proprioception in the upper and lower extremities.  Gait, station and balance are unremarkable. No veering to one side is noted. No leaning to one side is noted. Posture is age-appropriate and stance is narrow based. No problems turning are noted. She turns  en bloc. Tandem walk is not possible. She is unable to do toe or heel stance.               Assessment and Plan:   In summary, JINX GILDEN is a very pleasant 42 y.o.-year old female with a history of Seizures, and OSA, now back on CPAP treatment at a pressure of 10 cwp. Her physical exam is stable and She indicates fairly good results with the  use of CPAP, but recently has reduced the use of her CPAP to exactly 50% to where she was in the beginning. She actually reports reasonable tolerance of the pressure and the mask. I reviewed the compliance data with the patient and encouraged her to go back to using CPAP regularly all night and during daytime naps to help reduce cardiovascular risk. We talked about sleep hygiene and I advised her to keep a regular sleep and wake schedule, try not to exercise or have a meal within 2 hours of her bedtime, try to keep her bedroom conducive for sleep, that is, cool and dark, without light distractors such as an illuminated alarm clock, and refrain from watching TV right before sleep or in the middle of the night and do not keep the TV or radio on during the night. Also, she is advised not to use or play on electronic devices at bedtime.   We also talked about trying to maintaining a healthy lifestyle in general. I encouraged the patient to eat healthy, exercise daily and keep well hydrated, to keep a scheduled bedtime and wake time routine, to not skip any meals and eat healthy snacks in between meals and to have protein with every meal. I stressed the importance of regular exercise and weight loss.   I answered all their questions today and the patient and her fiance were in agreement with the above outlined plan. I would like to see the patient back in 3 months, sooner if the need arises and encouraged them to call with any interim questions, concerns, problems or updates.

## 2013-08-17 ENCOUNTER — Encounter: Payer: Self-pay | Admitting: Neurology

## 2013-08-20 ENCOUNTER — Telehealth: Payer: Self-pay | Admitting: Neurology

## 2013-08-20 NOTE — Telephone Encounter (Signed)
I called and spoke with patient and provided her with the information from Dr. Frances Furbish. She asked who could order her a bed. I advised her to discuss that further with Dr. Anne Hahn when she came to see him in October. Patient stated that she would discuss it with her primary doctor. I let her know I would share that information with Dr. Frances Furbish. Patient stated that she is using a c-pap machine.

## 2013-08-20 NOTE — Telephone Encounter (Signed)
Please advise patient that I cannot prescribe her a adjustable bed. As discussed, she will need treatment for obstructive sleep apnea with a CPAP machine. She is also advised to continue to followup with Dr. Anne Hahn as previously scheduled.

## 2013-08-22 ENCOUNTER — Telehealth: Payer: Self-pay | Admitting: Radiology

## 2013-08-22 ENCOUNTER — Ambulatory Visit (INDEPENDENT_AMBULATORY_CARE_PROVIDER_SITE_OTHER): Payer: Medicare Other | Admitting: Family Medicine

## 2013-08-22 VITALS — BP 128/82 | HR 72 | Temp 98.0°F | Resp 17 | Ht 60.5 in | Wt 291.0 lb

## 2013-08-22 DIAGNOSIS — R569 Unspecified convulsions: Secondary | ICD-10-CM | POA: Diagnosis not present

## 2013-08-22 DIAGNOSIS — G4733 Obstructive sleep apnea (adult) (pediatric): Secondary | ICD-10-CM

## 2013-08-22 DIAGNOSIS — M545 Low back pain, unspecified: Secondary | ICD-10-CM

## 2013-08-22 MED ORDER — LORCASERIN HCL 10 MG PO TABS: 1.0000 | ORAL_TABLET | Freq: Every day | ORAL | Status: DC

## 2013-08-22 NOTE — Patient Instructions (Signed)
Calorie Counting Diet A calorie counting diet requires you to eat the number of calories that are right for you in a day. Calories are the measurement of how much energy you get from the food you eat. Eating the right amount of calories is important for staying at a healthy weight. If you eat too many calories, your body will store them as fat and you may gain weight. If you eat too few calories, you may lose weight. Counting the number of calories you eat during a day will help you know if you are eating the right amount. A Registered Dietitian can determine how many calories you need in a day. The amount of calories needed varies from person to person. If your goal is to lose weight, you will need to eat fewer calories. Losing weight can benefit you if you are overweight or have health problems such as heart disease, high blood pressure, or diabetes. If your goal is to gain weight, you will need to eat more calories. Gaining weight may be necessary if you have a certain health problem that causes your body to need more energy. TIPS Whether you are increasing or decreasing the number of calories you eat during a day, it may be hard to get used to changes in what you eat and drink. The following are tips to help you keep track of the number of calories you eat.  Measure foods at home with measuring cups. This helps you know the amount of food and number of calories you are eating.  Restaurants often serve food in amounts that are larger than 1 serving. While eating out, estimate how many servings of a food you are given. For example, a serving of cooked rice is  cup or about the size of half of a fist. Knowing serving sizes will help you be aware of how much food you are eating at restaurants.  Ask for smaller portion sizes or child-size portions at restaurants.  Plan to eat half of a meal at a restaurant. Take the rest home or share the other half with a friend.  Read the Nutrition Facts panel on  food labels for calorie content and serving size. You can find out how many servings are in a package, the size of a serving, and the number of calories each serving has.  For example, a package might contain 3 cookies. The Nutrition Facts panel on that package says that 1 serving is 1 cookie. Below that, it will say there are 3 servings in the container. The calories section of the Nutrition Facts label says there are 90 calories. This means there are 90 calories in 1 cookie (1 serving). If you eat 1 cookie you have eaten 90 calories. If you eat all 3 cookies, you have eaten 270 calories (3 servings x 90 calories = 270 calories). The list below tells you how big or small some common portion sizes are.  1 oz.........4 stacked dice.  3 oz.........Deck of cards.  1 tsp........Tip of little finger.  1 tbs........Thumb.  2 tbs........Golf ball.   cup.......Half of a fist.  1 cup........A fist. KEEP A FOOD LOG Write down every food item you eat, the amount you eat, and the number of calories in each food you eat during the day. At the end of the day, you can add up the total number of calories you have eaten. It may help to keep a list like the one below. Find out the calorie information by reading the   Nutrition Facts panel on food labels. Breakfast  Bran cereal (1 cup, 110 calories).  Fat-free milk ( cup, 45 calories). Snack  Apple (1 medium, 80 calories). Lunch  Spinach (1 cup, 20 calories).  Tomato ( medium, 20 calories).  Chicken breast strips (3 oz, 165 calories).  Shredded cheddar cheese ( cup, 110 calories).  Light Italian dressing (2 tbs, 60 calories).  Whole-wheat bread (1 slice, 80 calories).  Tub margarine (1 tsp, 35 calories).  Vegetable soup (1 cup, 160 calories). Dinner  Pork chop (3 oz, 190 calories).  Brown rice (1 cup, 215 calories).  Steamed broccoli ( cup, 20 calories).  Strawberries (1  cup, 65 calories).  Whipped cream (1 tbs, 50  calories). Daily Calorie Total: 1425 Document Released: 11/25/2005 Document Revised: 02/17/2012 Document Reviewed: 05/22/2007 [employer] Patient Information 2014 [employer].  

## 2013-08-22 NOTE — Telephone Encounter (Signed)
Patient has been given rx for hospital bed

## 2013-08-22 NOTE — Progress Notes (Signed)
  Subjective:    Patient ID: Brandi Gomez, female    DOB: 18-Sep-1971, 42 y.o.   MRN: [id number]  HPI  42yr old female with complaints of back pain. Patient complains of trouble sleeping due to pain, states for two weeks wakes up with pain. States she can not sleep flat on her bed. Wants adjustable bed. Patient indicates her pain is located in the left flank. Patient states she has seen Dr Almyra Brace for her sleep apnea and she has indicated her CPAP does not need settings changed. Patient indicates when she was in hospital she slept fine. Patient frustrated over her current weight, states she wants to lose weight.   Review of Systems     Objective:   Physical Exam Obese female, seated on exam table complains of pain with her back.  HEENT: Unremarkable Neck: Supple without adenopathy or thyromegaly Chest: Clear and nontender Heart: Regular without murmur gallop or rub Abdomen: Soft nontender, morbidly obese Extremities: 1+ edema, normal gait      Assessment & Plan:  Hospital bed order given to patient, per her request.  Patient encouraged to lose weight, increase exercise, decrease calorie intake, encouraged walking and dancing, decrease television, and decrease food intake.   Seizures - Plan: DME Hospital bed  OSA on CPAP - Plan: DME Hospital bed  Lumbago - Plan: DME Hospital bed  Signed, Elvina Sidle, MD

## 2013-09-07 DIAGNOSIS — F70 Mild intellectual disabilities: Secondary | ICD-10-CM | POA: Diagnosis not present

## 2013-09-07 DIAGNOSIS — F331 Major depressive disorder, recurrent, moderate: Secondary | ICD-10-CM | POA: Diagnosis not present

## 2013-09-14 ENCOUNTER — Encounter: Payer: Self-pay | Admitting: Family Medicine

## 2013-09-14 ENCOUNTER — Ambulatory Visit (INDEPENDENT_AMBULATORY_CARE_PROVIDER_SITE_OTHER): Payer: Medicare Other | Admitting: Family Medicine

## 2013-09-14 VITALS — BP 126/84 | HR 66 | Temp 98.9°F | Resp 18 | Ht 60.5 in | Wt 295.8 lb

## 2013-09-14 DIAGNOSIS — R21 Rash and other nonspecific skin eruption: Secondary | ICD-10-CM

## 2013-09-14 DIAGNOSIS — F411 Generalized anxiety disorder: Secondary | ICD-10-CM

## 2013-09-14 DIAGNOSIS — E119 Type 2 diabetes mellitus without complications: Secondary | ICD-10-CM

## 2013-09-14 DIAGNOSIS — N390 Urinary tract infection, site not specified: Secondary | ICD-10-CM

## 2013-09-14 DIAGNOSIS — R109 Unspecified abdominal pain: Secondary | ICD-10-CM

## 2013-09-14 LAB — POCT URINALYSIS DIPSTICK
Bilirubin, UA: NEGATIVE
Glucose, UA: NEGATIVE
Ketones, UA: NEGATIVE
Leukocytes, UA: NEGATIVE
Nitrite, UA: NEGATIVE
Protein, UA: NEGATIVE
Spec Grav, UA: 1.02
Urobilinogen, UA: 0.2
pH, UA: 5.5

## 2013-09-14 LAB — POCT UA - MICROSCOPIC ONLY
Crystals, Ur, HPF, POC: NEGATIVE
Mucus, UA: NEGATIVE
Yeast, UA: NEGATIVE

## 2013-09-14 MED ORDER — MUPIROCIN 2 % EX OINT: TOPICAL_OINTMENT | Freq: Three times a day (TID) | CUTANEOUS | Status: DC

## 2013-09-14 NOTE — Progress Notes (Signed)
Subjective:    Patient ID: Brandi Gomez, female    DOB: 21-Jan-1971, 42 y.o.   MRN: [id number] HPI Review of Systems Objective:   Physical Exam Assessment & Plan:    @UMFCLOGO @   Patient ID: Brandi Gomez MRN: [id number], DOB: 11-Jul-1971, 42 y.o. Date of Encounter: 09/14/2013, 12:04 PM This chart was scribed for Brandi Gomez, Brandi Gomez by Brandi Gomez, ED Scribe. This patient was seen in room 01and the patient's care was started at 12:04.   Primary Physician: Brandi Gomez, Brandi Gomez  Chief Complaint:  Chief Complaint  Patient presents with  . Urinary Tract Infection    pressure  . Rash    pt states she has been itching on her right leg    HPI: 42 year old female presents with history of dysuria, urgency, and frequency. She reports that she feels an unusual pressure when urinating. A pulling like pressure. No hematuria. No sick contacts, recent antibiotics, or recent travels. No vaginal discharge, back pain, fever.   She also reports a sudden, mild, itchy rash underneath her chin. She is requesting a cream for her rash.  When asked about her mattress, she reports that she tried to change her mattress size, but that it is still too thin.   When asked about her eating habits, she reports eating everything, including chips, nuts, sodas, etc. She reports eating throughout the day and into the night. She states she enjoys fish. She doesn't enjoy drinking water. She was informed that sugars and breads will make her hungry by Dr. Milus Glazier. She understood that she needs to change her eating habits.   She denies smoking, EtOH use.   She reports having a 84 year old daughter, who is healthy.   Past Medical History  Diagnosis Date  . Type II or unspecified type diabetes mellitus without mention of complication, not stated as uncontrolled   . Hypertension   . Sleep apnea   . Anxiety   . Depression   . Bipolar 1 disorder   . Seizures     intractable  . Obesity   . Mild mental  retardation      Home Meds: Prior to Admission medications   Medication Sig Start Date End Date Taking? Authorizing Provider  citalopram (CELEXA) 20 MG tablet Take 2 tablets (40 mg total) by mouth daily. PATIENT NEEDS OFFICE VISIT FOR ADDITIONAL REFILLS 07/06/13  Yes Brandi Gomez, Brandi Gomez  ibuprofen (ADVIL,MOTRIN) 200 MG tablet Take 200 mg by mouth every 6 (six) hours as needed.   Yes Historical Provider, Brandi Gomez  phenytoin (DILANTIN) 100 MG ER capsule 2 capsules in the morning, 3 capsules in the evening. Brand is medically necessary. 03/10/13  Yes York Spaniel, Brandi Gomez  phenytoin (DILANTIN) 50 MG tablet Chew 50 mg by mouth 2 (two) times daily. For seizure control 03/10/13  Yes York Spaniel, Brandi Gomez  sitaGLIPtin (JANUVIA) 50 MG tablet Take 1 tablet (50 mg total) by mouth daily. For control of blood sugar 02/24/13  Yes Brandi Gomez, Brandi Gomez  torsemide (DEMADEX) 20 MG tablet Take 1 tablet (20 mg total) by mouth daily. 04/12/13  Yes Brandi Gomez, Brandi Gomez  doxycycline (VIBRA-TABS) 100 MG tablet Take 1 tablet (100 mg total) by mouth 2 (two) times daily. 06/20/13   Brandi Gomez, Brandi Gomez  Lorcaserin HCl (BELVIQ) 10 MG TABS Take 1 tablet by mouth daily. 08/22/13   Brandi Gomez, Brandi Gomez    Allergies:  Allergies  Allergen Reactions  . Amoxicillin Itching  . Hydrocodone Other (See Comments)  Depressed     History   Social History  . Marital Status: Single    Spouse Name: N/A    Number of Children: N/A  . Years of Education: N/A   Occupational History  . disabled    Social History Main Topics  . Smoking status: Never Smoker   . Smokeless tobacco: Never Used  . Alcohol Use: No  . Drug Use: No  . Sexual Activity: Not on file   Other Topics Concern  . Not on file   Social History Narrative  . No narrative on file     Review of Systems: Constitutional: negative for chills, fever, night sweats or weight changes Cardiovascular: negative for chest pain or palpitations Respiratory: negative for hemoptysis,  wheezing, or shortness of breath Abdominal: negative for abdominal pain, nausea, vomiting or diarrhea.  Does have some pelvic "pulling" with voiding Dermatological: negative for rash Neurologic: negative for headache   Physical Exam: Blood pressure 126/84, pulse 66, temperature 98.9 F (37.2 C), temperature source Oral, resp. rate 18, height 5' 0.5" (1.537 m), weight 295 lb 12.8 oz (134.174 kg), SpO2 100.00%., There is no weight on file to calculate BMI. General: Well developed, well nourished, in no acute distress. Head: Normocephalic, atraumatic, eyes without discharge, sclera non-icteric, nares are congested. Bilateral auditory canals clear, TM's are without perforation, pearly grey with reflective cone of light bilaterally. Serous effusion bilaterally behind TM's. Maxillary sinus TTP. Oral cavity moist, dentition normal. Posterior pharynx with post nasal drip and mild erythema. No peritonsillar abscess or tonsillar exudate. Neck: Supple. No thyromegaly. Full ROM. No lymphadenopathy. Lungs: Coarse breath sounds bilaterally without Clear bilaterally to auscultation without wheezes, rales, or rhonchi. Breathing is unlabored.  Heart: RRR with S1 S2. No murmurs, rubs, or gallops appreciated. Abdomen: Soft, non-tender, non-distended with normoactive bowel sounds. No hepatosplenomegaly. No rebound/guarding. No obvious abdominal masses. McBurney's, Rovsing's, Iliopsoas, and table jar all negative. Msk:  Strength and tone normal for age. Extremities: No clubbing or cyanosis. No edema. Neuro: Alert and oriented X 3. Moves all extremities spontaneously. CNII-XII grossly in tact. Psych:  Responds to questions appropriately with a normal affect.   Labs:   ASSESSMENT AND PLAN:  42 y.o. year old female with UTI (urinary tract infection) - Plan: POCT urinalysis dipstick, POCT UA - Microscopic Only, Urine culture  Rash and nonspecific skin eruption  Abdominal  pain, other specified site  Type II or  unspecified type diabetes mellitus without mention of complication, not stated as uncontrolled   - -Mucinex -Tylenol/Motrin prn -Rest/fluids -RTC precautions -RTC 3-5 days if no improvement  Signed, Brandi Gomez, Brandi Gomez 09/14/2013 11:57 AM

## 2013-09-15 ENCOUNTER — Ambulatory Visit: Payer: Medicare Other | Admitting: Neurology

## 2013-09-15 LAB — URINE CULTURE
Colony Count: NO GROWTH
Organism ID, Bacteria: NO GROWTH

## 2013-09-20 ENCOUNTER — Telehealth: Payer: Self-pay

## 2013-09-20 DIAGNOSIS — M545 Low back pain: Secondary | ICD-10-CM

## 2013-09-20 NOTE — Telephone Encounter (Signed)
I do not see any medical necessity for an egg crate, please advise.

## 2013-09-20 NOTE — Telephone Encounter (Signed)
Patient is morbidly obese and suffers back pain.  Egg crate mattress was recommended to decrease her suffering

## 2013-09-20 NOTE — Telephone Encounter (Signed)
Patient would like to [place] to call in for her to get her egg crate foam from [facility].  Patient states that she can not pick up the prescription and is needing [place] to call it in.  Best#: [phone]

## 2013-09-21 NOTE — Addendum Note (Signed)
Addended byCaffie Damme on: 09/21/2013 08:28 AM   Modules accepted: Orders

## 2013-09-21 NOTE — Telephone Encounter (Signed)
Thank you. Order put in, signed, can you assist patient in getting needed DME?

## 2013-09-22 NOTE — Telephone Encounter (Signed)
Spoke w/Amy to see if she has heard anything back from [facility]. Amy reported that [facility] from [facility] advised that ins will not cover the egg crate mattress and that it would be less exp for pt to buy from [facility]. LMOM for pt to CB to advise.

## 2013-09-22 NOTE — Telephone Encounter (Signed)
Spoke with pt, advised message from Amy. Pt understood.

## 2013-09-23 ENCOUNTER — Telehealth: Payer: Self-pay

## 2013-09-23 NOTE — Telephone Encounter (Signed)
Patient is requesting alternate pressure pads for her hospital bed.  Insurance company will not pay for the egg crate.   [phone]

## 2013-09-23 NOTE — Telephone Encounter (Signed)
Patient advised per Brandi Gomez medicare will not cover the egg crate pad, she was advised to try [facility] , but she wants you to recommend something Medicare may cover, I am not familiar with anything else, are you?

## 2013-09-24 ENCOUNTER — Telehealth: Payer: Self-pay | Admitting: Radiology

## 2013-09-24 NOTE — Telephone Encounter (Signed)
Patient states a pressure pad is covered, she will get the information, so we can check with Dr Milus Glazier.

## 2013-09-24 NOTE — Telephone Encounter (Signed)
I don't know of anything else at present

## 2013-09-24 NOTE — Telephone Encounter (Signed)
Called to advise.  Left message

## 2013-09-27 ENCOUNTER — Telehealth: Payer: Self-pay

## 2013-09-27 NOTE — Telephone Encounter (Signed)
PT WOULD LIKE TO KNOW IF DR KURT WOULD ORDER HER AN ALTERNATIVE BED PAD FOR HER, THE MATTRESS HE ORDERED ISN'T COVERED BY HER INSURANCE BUT THE PAD IS PLEASE CALL [phone]   [facility]

## 2013-09-27 NOTE — Telephone Encounter (Signed)
Have advised her we do not know what will be covered, she has indicated she will bring in the information.

## 2013-09-30 ENCOUNTER — Telehealth: Payer: Self-pay

## 2013-09-30 NOTE — Telephone Encounter (Signed)
Called her again to advise her this is not covered by medicare she indicates it is called alternating pressure pad, please advise. She states she is getting sores on her buttock, small areas, using cream. Please advise.

## 2013-09-30 NOTE — Telephone Encounter (Signed)
Patient is calling to check the status of her alternative bed pad.  [phone]

## 2013-10-01 NOTE — Telephone Encounter (Signed)
I really don't have an answer for this.  She can come in so we can discuss this, but I cannot provide what Medicare does not cover

## 2013-11-07 ENCOUNTER — Other Ambulatory Visit: Payer: Self-pay | Admitting: Neurology

## 2013-11-12 ENCOUNTER — Ambulatory Visit: Payer: Medicare Other | Admitting: Neurology

## 2014-01-01 ENCOUNTER — Ambulatory Visit: Payer: Medicare Other

## 2014-01-01 ENCOUNTER — Ambulatory Visit (INDEPENDENT_AMBULATORY_CARE_PROVIDER_SITE_OTHER): Payer: Medicare Other | Admitting: Family Medicine

## 2014-01-01 VITALS — BP 120/70 | HR 74 | Temp 98.2°F | Resp 18 | Ht 60.5 in | Wt 286.2 lb

## 2014-01-01 DIAGNOSIS — E119 Type 2 diabetes mellitus without complications: Secondary | ICD-10-CM

## 2014-01-01 DIAGNOSIS — R059 Cough, unspecified: Secondary | ICD-10-CM

## 2014-01-01 DIAGNOSIS — R05 Cough: Secondary | ICD-10-CM

## 2014-01-01 DIAGNOSIS — J020 Streptococcal pharyngitis: Secondary | ICD-10-CM | POA: Diagnosis not present

## 2014-01-01 DIAGNOSIS — R5383 Other fatigue: Secondary | ICD-10-CM | POA: Diagnosis not present

## 2014-01-01 DIAGNOSIS — R42 Dizziness and giddiness: Secondary | ICD-10-CM | POA: Diagnosis not present

## 2014-01-01 DIAGNOSIS — R5381 Other malaise: Secondary | ICD-10-CM

## 2014-01-01 LAB — POCT CBC
Granulocyte percent: 47.7 % (ref 37–80)
HCT, POC: 36.5 % — AB (ref 37.7–47.9)
Hemoglobin: 11.1 g/dL — AB (ref 12.2–16.2)
Lymph, poc: 3.1 (ref 0.6–3.4)
MCH, POC: 29.4 pg (ref 27–31.2)
MCHC: 30.4 g/dL — AB (ref 31.8–35.4)
MCV: 96.8 fL (ref 80–97)
MID (cbc): 0.6 (ref 0–0.9)
MPV: 9.6 fL (ref 0–99.8)
POC Granulocyte: 3.4 (ref 2–6.9)
POC LYMPH PERCENT: 44.3 %L (ref 10–50)
POC MID %: 8 %M (ref 0–12)
Platelet Count, POC: 259 10*3/uL (ref 142–424)
RBC: 3.77 M/uL — AB (ref 4.04–5.48)
RDW, POC: 13.8 %
WBC: 7.1 10*3/uL (ref 4.6–10.2)

## 2014-01-01 LAB — POCT URINALYSIS DIPSTICK
Bilirubin, UA: NEGATIVE
Glucose, UA: NEGATIVE
Leukocytes, UA: NEGATIVE
Nitrite, UA: NEGATIVE
Spec Grav, UA: 1.025
Urobilinogen, UA: 0.2
pH, UA: 5.5

## 2014-01-01 LAB — POCT UA - MICROSCOPIC ONLY
Casts, Ur, LPF, POC: NEGATIVE
Crystals, Ur, HPF, POC: NEGATIVE
Yeast, UA: NEGATIVE

## 2014-01-01 LAB — POCT RAPID STREP A (OFFICE): Rapid Strep A Screen: POSITIVE — AB

## 2014-01-01 LAB — POCT GLYCOSYLATED HEMOGLOBIN (HGB A1C): Hemoglobin A1C: 6.1

## 2014-01-01 MED ORDER — FIRST-DUKES MOUTHWASH MT SUSP: 10.0000 mL | Freq: Three times a day (TID) | OROMUCOSAL | Status: DC | PRN

## 2014-01-01 MED ORDER — AZITHROMYCIN 250 MG PO TABS: ORAL_TABLET | ORAL | Status: DC

## 2014-01-01 NOTE — Progress Notes (Signed)
Chief Complaint:  Chief Complaint  Patient presents with  . Headache  . Fatigue    no energy  . throat fullness  . thyroid concerns    HPI: Brandi Gomez is a 43 y.o. female who is here for cough, sore throat,  dizziness, hot and cold flashes at night, chest congestion, migraines, for about three weeks.  Feels tired, lots of mucus in her throat and lots of headaches, body feels hot and cold chills. Denies fevers. She did not get flu vaccine She has been complaint with medications She states her DM has been well controlled, she has had some INcrease frequency, denies urgency,admits to not completely empty baldder Denies n/v/abd pain /pelvic pain or back pain Has had numbness in her feet but htat has been chronic.     Past Medical History  Diagnosis Date  . Type II or unspecified type diabetes mellitus without mention of complication, not stated as uncontrolled   . Hypertension   . Sleep apnea   . Anxiety   . Depression   . Bipolar 1 disorder   . Seizures     intractable  . Obesity   . Mild mental retardation    Past Surgical History  Procedure Laterality Date  . Nasal sinus surgery    . Myringotomy with tube placement    . Colonoscopy      2012-normal , Dr Sharlett Iles  . Esophagogastroduodenoscopy      normal-Dr Sharlett Iles 2012   History   Social History  . Marital Status: Single    Spouse Name: N/A    Number of Children: N/A  . Years of Education: N/A   Occupational History  . disabled    Social History Main Topics  . Smoking status: Never Smoker   . Smokeless tobacco: Never Used  . Alcohol Use: No  . Drug Use: No  . Sexual Activity: None   Other Topics Concern  . None   Social History Narrative  . None   Family History  Problem Relation Age of Onset  . Diabetes Mother     passed away from accidental death  . Mental illness Father   . Diabetes Daughter    Allergies  Allergen Reactions  . Amoxicillin Itching  . Hydrocodone Other  (See Comments)    Depressed    Prior to Admission medications   Medication Sig Start Date End Date Taking? Authorizing Provider  citalopram (CELEXA) 20 MG tablet Take 2 tablets (40 mg total) by mouth daily. PATIENT NEEDS OFFICE VISIT FOR ADDITIONAL REFILLS 07/06/13  Yes [name], MD  DILANTIN INFATABS 50 MG tablet CHEW 2 TABLETS (100 MG TOTAL) BY MOUTH EVERY MORNING. FOR SEIZURE CONTROL 11/07/13  Yes [name], MD  ibuprofen (ADVIL,MOTRIN) 200 MG tablet Take 200 mg by mouth every 6 (six) hours as needed.   Yes Historical Provider, MD  phenytoin (DILANTIN) 100 MG ER capsule 2 capsules in the morning, 3 capsules in the evening. Brand is medically necessary. 03/10/13  Yes [name], MD  doxycycline (VIBRA-TABS) 100 MG tablet Take 1 tablet (100 mg total) by mouth 2 (two) times daily. 06/20/13   [name], MD  Lorcaserin HCl (BELVIQ) 10 MG TABS Take 1 tablet by mouth daily. 08/22/13   [name], MD  mupirocin ointment (BACTROBAN) 2 % Apply topically 3 (three) times daily. 09/14/13   [name], MD  sitaGLIPtin (JANUVIA) 50 MG tablet Take 1 tablet (50 mg total) by mouth daily. For control  of blood sugar 02/24/13   [name], MD  torsemide (DEMADEX) 20 MG tablet Take 1 tablet (20 mg total) by mouth daily. 04/12/13   [name], MD     ROS: The patient denies fevers, chills, night sweats, unintentional weight loss, chest pain, palpitations, wheezing, dyspnea on exertion, nausea, vomiting, abdominal pain, dysuria, hematuria, melena, numbness, weakness, or tingling.   All other systems have been reviewed and were otherwise negative with the exception of those mentioned in the HPI and as above.    PHYSICAL EXAM: Filed Vitals:   01/01/14 1543  BP: 120/70  Pulse: 74  Temp: 98.2 F (36.8 C)  Resp: 18   Filed Vitals:   01/01/14 1543  Height: 5' 0.5" (1.537 m)  Weight: 286 lb 3.2 oz (129.819 kg)   Body mass index is 54.95 kg/(m^2).  General: Alert, no  acute distress, obese AA female, delayed HEENT:  Normocephalic, atraumatic, oropharynx patent. EOMI, PERRLA, + mucus, + erythema throat , TM nl, no exudates Cardiovascular:  Regular rate and rhythm, no rubs murmurs or gallops.  No Carotid bruits, radial pulse intact. No pedal edema.  Respiratory: Clear to auscultation bilaterally.  No wheezes, rales, or rhonchi.  No cyanosis, no use of accessory musculature GI: No organomegaly, abdomen is soft and non-tender, positive bowel sounds.  No masses. Skin: No rashes. Neurologic: Facial musculature symmetric. Psychiatric: Patient is appropriate throughout our interaction. Lymphatic: No cervical lymphadenopathy Musculoskeletal: Gait intact.   LABS: Results for orders placed in visit on 01/01/14  TSH      Result Value Range   TSH 2.096  0.350 - 4.500 uIU/mL  POCT CBC      Result Value Range   WBC 7.1  4.6 - 10.2 K/uL   Lymph, poc 3.1  0.6 - 3.4   POC LYMPH PERCENT 44.3  10 - 50 %L   MID (cbc) 0.6  0 - 0.9   POC MID % 8.0  0 - 12 %M   POC Granulocyte 3.4  2 - 6.9   Granulocyte percent 47.7  37 - 80 %G   RBC 3.77 (*) 4.04 - 5.48 M/uL   Hemoglobin 11.1 (*) 12.2 - 16.2 g/dL   HCT, POC 36.5 (*) 37.7 - 47.9 %   MCV 96.8  80 - 97 fL   MCH, POC 29.4  27 - 31.2 pg   MCHC 30.4 (*) 31.8 - 35.4 g/dL   RDW, POC 13.8     Platelet Count, POC 259  142 - 424 K/uL   MPV 9.6  0 - 99.8 fL  POCT RAPID STREP A (OFFICE)      Result Value Range   Rapid Strep A Screen Positive (*) Negative  POCT UA - MICROSCOPIC ONLY      Result Value Range   WBC, Ur, HPF, POC 8-17     RBC, urine, microscopic 0-4     Bacteria, U Microscopic 1+     Mucus, UA mod     Epithelial cells, urine per micros 0-2     Crystals, Ur, HPF, POC neg     Casts, Ur, LPF, POC neg     Yeast, UA neg    POCT URINALYSIS DIPSTICK      Result Value Range   Color, UA amber     Clarity, UA cloudy     Glucose, UA neg     Bilirubin, UA neg     Ketones, UA trace     Spec Grav, UA 1.025  Blood, UA trace-lysed     pH, UA 5.5     Protein, UA trace     Urobilinogen, UA 0.2     Nitrite, UA neg     Leukocytes, UA Negative    POCT GLYCOSYLATED HEMOGLOBIN (HGB A1C)      Result Value Range   Hemoglobin A1C 6.1       EKG/XRAY:   Primary read interpreted by Dr. Marin Comment at [facility]. No acute cardiopulmonary process + Cardiomegaly   ASSESSMENT/PLAN: Encounter Diagnoses  Name Primary?  . Cough   . Dizziness and giddiness   . Other malaise and fatigue   . Type II or unspecified type diabetes mellitus without mention of complication, not stated as uncontrolled   . Strep pharyngitis Yes   Brandi Gomez is a pleasant 44 y/o AA female who is cognittively delayed who presents with a 3 week hx of strep pharyngitis Rx Azithromycin for strep pharyngitis due to PCN allergy Magic mouthwash, salt water gargles She has an assortment of other issues but since it takes a while to get the history I will ask her to return in 1 week to see how here sxs are doing.  Iron deficiency anemia is stable (Hgb stable at around 10-11)  She has had normal EGD and colonoscopy in 2012 with Dr Sharlett Iles, she has hematuria without infection on UA consider rechecking hematuria in 1 week She is stable with her diabetes.  F/u prn or in 1 week   Gross sideeffects, risk and benefits, and alternatives of medications d/w patient. Patient is aware that all medications have potential sideeffects and we are unable to predict every sideeffect or drug-drug interaction that may occur.  LE, [place], DO 01/03/2014 4:19 PM

## 2014-01-01 NOTE — Patient Instructions (Signed)
Strep Throat  Strep throat is an infection of the throat caused by a bacteria named Streptococcus pyogenes. Your caregiver may call the infection streptococcal "tonsillitis" or "pharyngitis" depending on whether there are signs of inflammation in the tonsils or back of the throat. Strep throat is most common in children aged 45 15 years during the cold months of the year, but it can occur in people of any age during any season. This infection is spread from person to person (contagious) through coughing, sneezing, or other close contact.  SYMPTOMS   · Fever or chills.  · Painful, swollen, red tonsils or throat.  · Pain or difficulty when swallowing.  · White or yellow spots on the tonsils or throat.  · Swollen, tender lymph nodes or "glands" of the neck or under the jaw.  · Red rash all over the body (rare).  DIAGNOSIS   Many different infections can cause the same symptoms. A test must be done to confirm the diagnosis so the right treatment can be given. A "rapid strep test" can help your caregiver make the diagnosis in a few minutes. If this test is not available, a light swab of the infected area can be used for a throat culture test. If a throat culture test is done, results are usually available in a day or two.  TREATMENT   Strep throat is treated with antibiotic medicine.  HOME CARE INSTRUCTIONS   · Gargle with 1 tsp of salt in 1 cup of warm water, 3 4 times per day or as needed for comfort.  · Family members who also have a sore throat or fever should be tested for strep throat and treated with antibiotics if they have the strep infection.  · Make sure everyone in your household washes their hands well.  · Do not share food, drinking cups, or personal items that could cause the infection to spread to others.  · You may need to eat a soft food diet until your sore throat gets better.  · Drink enough water and fluids to keep your urine clear or pale yellow. This will help prevent dehydration.  · Get plenty of  rest.  · Stay home from school, daycare, or work until you have been on antibiotics for 24 hours.  · Only take over-the-counter or prescription medicines for pain, discomfort, or fever as directed by your caregiver.  · If antibiotics are prescribed, take them as directed. Finish them even if you start to feel better.  SEEK MEDICAL CARE IF:   · The glands in your neck continue to enlarge.  · You develop a rash, cough, or earache.  · You cough up green, yellow-brown, or bloody sputum.  · You have pain or discomfort not controlled by medicines.  · Your problems seem to be getting worse rather than better.  SEEK IMMEDIATE MEDICAL CARE IF:   · You develop any new symptoms such as vomiting, severe headache, stiff or painful neck, chest pain, shortness of breath, or trouble swallowing.  · You develop severe throat pain, drooling, or changes in your voice.  · You develop swelling of the neck, or the skin on the neck becomes red and tender.  · You have a fever.  · You develop signs of dehydration, such as fatigue, dry mouth, and decreased urination.  · You become increasingly sleepy, or you cannot wake up completely.  Document Released: 11/22/2000 Document Revised: 11/11/2012 Document Reviewed: 01/24/2011  ExitCare® Patient Information ©2014 [employer].

## 2014-01-02 LAB — TSH: TSH: 2.096 u[IU]/mL (ref 0.350–4.500)

## 2014-01-03 ENCOUNTER — Encounter: Payer: Self-pay | Admitting: Family Medicine

## 2014-02-13 ENCOUNTER — Ambulatory Visit (INDEPENDENT_AMBULATORY_CARE_PROVIDER_SITE_OTHER): Payer: Medicare Other | Admitting: Family Medicine

## 2014-02-13 VITALS — BP 132/84 | HR 73 | Temp 98.3°F | Resp 16 | Ht 60.5 in | Wt 286.3 lb

## 2014-02-13 DIAGNOSIS — J029 Acute pharyngitis, unspecified: Secondary | ICD-10-CM | POA: Diagnosis not present

## 2014-02-13 DIAGNOSIS — J020 Streptococcal pharyngitis: Secondary | ICD-10-CM

## 2014-02-13 MED ORDER — METHYLPREDNISOLONE ACETATE 80 MG/ML IJ SUSP
80.0000 mg | Freq: Once | INTRAMUSCULAR | Status: AC
  Administered 2014-02-13: 80 mg via INTRAMUSCULAR

## 2014-02-13 MED ORDER — AZITHROMYCIN 250 MG PO TABS: ORAL_TABLET | ORAL | Status: DC

## 2014-02-13 NOTE — Progress Notes (Signed)
Subjective:    Patient ID: Brandi Gomez, female    DOB: 10/10/1971, 44 y.o.   MRN: [id number]  HPI Chief Complaint  Patient presents with   Mucus in Throat    x 1 month    Sneezing    x 2 weeks ; hears poping in hear when sneezing     This chart was scribed for [name], MD by [name], ED Scribe. This patient was seen in room 13 and the patient's care was started at 11:21 AM.  HPI Comments: Brandi Gomez is a 43 y.o. female who presents to the [facility] complaining of mucus in her throat onset 3 weeks with associated sneezing onset 2 weeks. Pt states when she uses her breathing tubes at night because it makes her nostrils dry.   Past Medical History  Diagnosis Date   Type II or unspecified type diabetes mellitus without mention of complication, not stated as uncontrolled    Hypertension    Sleep apnea    Anxiety    Depression    Bipolar 1 disorder    Seizures     intractable   Obesity    Mild mental retardation    Allergies  Allergen Reactions   Amoxicillin Itching   Hydrocodone Other (See Comments)    Depressed    Prior to Admission medications   Medication Sig Start Date End Date Taking? Authorizing Provider  citalopram (CELEXA) 20 MG tablet Take 2 tablets (40 mg total) by mouth daily. PATIENT NEEDS OFFICE VISIT FOR ADDITIONAL REFILLS 07/06/13  Yes [name], MD  DILANTIN INFATABS 50 MG tablet CHEW 2 TABLETS (100 MG TOTAL) BY MOUTH EVERY MORNING. FOR SEIZURE CONTROL 11/07/13  Yes [name], MD  ibuprofen (ADVIL,MOTRIN) 200 MG tablet Take 200 mg by mouth every 6 (six) hours as needed.   Yes Historical Provider, MD  phenytoin (DILANTIN) 100 MG ER capsule 2 capsules in the morning, 3 capsules in the evening. Brand is medically necessary. 03/10/13  Yes [name], MD  torsemide (DEMADEX) 20 MG tablet Take 1 tablet (20 mg total) by mouth daily. 04/12/13  Yes [name], MD  azithromycin (ZITHROMAX)  250 MG tablet Take 2 tabs po now then 1 tab po daily 01/01/14   Thao P Le, DO  Diphenhyd-Hydrocort-Nystatin (FIRST-DUKES MOUTHWASH) SUSP Use as directed 10 mLs in the mouth or throat 3 (three) times daily as needed. Swish and spit 01/01/14   Thao P Le, DO  doxycycline (VIBRA-TABS) 100 MG tablet Take 1 tablet (100 mg total) by mouth 2 (two) times daily. 06/20/13   [name], MD  Lorcaserin HCl (BELVIQ) 10 MG TABS Take 1 tablet by mouth daily. 08/22/13   [name], MD  mupirocin ointment (BACTROBAN) 2 % Apply topically 3 (three) times daily. 09/14/13   [name], MD  sitaGLIPtin (JANUVIA) 50 MG tablet Take 1 tablet (50 mg total) by mouth daily. For control of blood sugar 02/24/13   [name], MD   Review of Systems  Constitutional: Negative for fever and chills.  HENT: Positive for sneezing.   Respiratory: Negative for cough.        Objective:   Physical Exam  Nursing note and vitals reviewed. Constitutional: She is oriented to person, place, and time. She appears well-developed and well-nourished. No distress.  HENT:  Head: Normocephalic and atraumatic.  Right Ear: Tympanic membrane is retracted.  Left Ear: Tympanic membrane is retracted.  Eyes: EOM are normal.  Neck:  Neck supple.  Cardiovascular: Normal rate.   Pulmonary/Chest: Effort normal.  Musculoskeletal: Normal range of motion.  Neurological: She is alert and oriented to person, place, and time.  Skin: Skin is warm and dry.  Psychiatric: She has a normal mood and affect. Her behavior is normal.      Assessment & Plan:   Strep pharyngitis - Plan: azithromycin (ZITHROMAX) 250 MG tablet, methylPREDNISolone acetate (DEPO-MEDROL) injection 80 mg  Acute pharyngitis  Signed, [name], MD

## 2014-02-21 ENCOUNTER — Other Ambulatory Visit: Payer: Self-pay | Admitting: Neurology

## 2014-03-09 ENCOUNTER — Other Ambulatory Visit: Payer: Self-pay | Admitting: Neurology

## 2014-03-09 NOTE — Telephone Encounter (Signed)
Please refer to note from 04/02.  It says: I called patient. The Dilantin level is 11. I will increase the Dilantin dosing by 50 mg daily. The patient will go to 250 mg in the morning, 300 mg in the evening. I discussed this with the patient. I'll call in prescriptions that reflect that.      50mg  Rx was entered and sent as two tabs (100mg ) in the morning.

## 2014-03-14 ENCOUNTER — Ambulatory Visit (INDEPENDENT_AMBULATORY_CARE_PROVIDER_SITE_OTHER): Payer: Medicare Other | Admitting: Family Medicine

## 2014-03-14 VITALS — BP 120/80 | HR 68 | Temp 98.9°F | Resp 14 | Ht 60.5 in | Wt 294.2 lb

## 2014-03-14 DIAGNOSIS — N63 Unspecified lump in unspecified breast: Secondary | ICD-10-CM

## 2014-03-14 DIAGNOSIS — N644 Mastodynia: Secondary | ICD-10-CM | POA: Diagnosis not present

## 2014-03-14 NOTE — Progress Notes (Signed)
 Chief Complaint:  Chief Complaint  Patient presents with  . Breast Pain    C/O right breast pain from under the arm around to mipple. Also palpated 2 knots and reports a weird sensation, & tender. Noticed last week. No leakage noticed.     HPI: Brandi Gomez is a 43 y.o. female who is here for  1 week history of right breast lumps , 3 lumps which she noticed on self palpation, nothing moves around, they ar just painful lumps. She has had no skin changes, unintentional weight loss, night sweats or nipple discharge. She has not had mammogram ever. She denies any family history of breast cancehe never gets lumps or cystic changes around her periods. She denies smoking, or excessive use of caffeine.   Past Medical History  Diagnosis Date  . Type II or unspecified type diabetes mellitus without mention of complication, not stated as uncontrolled   . Hypertension   . Sleep apnea   . Anxiety   . Depression   . Bipolar 1 disorder   . Seizures     intractable  . Obesity   . Mild mental retardation    Past Surgical History  Procedure Laterality Date  . Nasal sinus surgery    . Myringotomy with tube placement    . Colonoscopy      2012-normal , Dr Sharlett Iles  . Esophagogastroduodenoscopy      normal-Dr Sharlett Iles 2012   History   Social History  . Marital Status: Single    Spouse Name: N/A    Number of Children: N/A  . Years of Education: N/A   Occupational History  . disabled    Social History Main Topics  . Smoking status: Never Smoker   . Smokeless tobacco: Never Used  . Alcohol Use: No  . Drug Use: No  . Sexual Activity: None   Other Topics Concern  . None   Social History Narrative  . None   Family History  Problem Relation Age of Onset  . Diabetes Mother     passed away from accidental death  . Mental illness Father   . Diabetes Daughter    Allergies  Allergen Reactions  . Amoxicillin Itching  . Hydrocodone Other (See Comments)    Depressed      Prior to Admission medications   Medication Sig Start Date End Date Taking? Authorizing Provider  citalopram (CELEXA) 20 MG tablet Take 2 tablets (40 mg total) by mouth daily. PATIENT NEEDS OFFICE VISIT FOR ADDITIONAL REFILLS 07/06/13  Yes [name], MD  DILANTIN 100 MG ER capsule 2 CAPSULES IN THE MORNING, 3 CAPSULES IN THE EVENING. BRAND IS MEDICALLY NECESSARY. 02/21/14  Yes [name], MD  DILANTIN INFATABS 50 MG tablet CHEW 2 TABLETS (100 MG TOTAL) BY MOUTH EVERY MORNING. FOR SEIZURE CONTROL 03/09/14  Yes [name], MD  ibuprofen (ADVIL,MOTRIN) 200 MG tablet Take 200 mg by mouth every 6 (six) hours as needed.   Yes Historical Provider, MD  torsemide (DEMADEX) 20 MG tablet Take 1 tablet (20 mg total) by mouth daily. 04/12/13  Yes [name], MD  sitaGLIPtin (JANUVIA) 50 MG tablet Take 1 tablet (50 mg total) by mouth daily. For control of blood sugar 02/24/13   [name], MD     ROS: The patient denies fevers, chills, night sweats, unintentional weight loss, chest pain, palpitations, wheezing, dyspnea on exertion, nausea, vomiting, abdominal pain, dysuria, hematuria, melena, numbness, weakness, or tingling.   All other  systems have been reviewed and were otherwise negative with the exception of those mentioned in the HPI and as above.    PHYSICAL EXAM: Filed Vitals:   03/14/14 1941  BP: 120/80  Pulse: 68  Temp: 98.9 F (37.2 C)  Resp: 14   Filed Vitals:   03/14/14 1941  Height: 5' 0.5" (1.537 m)  Weight: 294 lb 4 oz (133.471 kg)   Body mass index is 56.5 kg/(m^2).  General: Alert, no acute distress, morbidly obese AA female HEENT:  Normocephalic, atraumatic, oropharynx patent. EOMI, PERRLA Cardiovascular:  Regular rate and rhythm, no rubs murmurs or gallops.  No Carotid bruits, radial pulse intact. No pedal edema.  Respiratory: Clear to auscultation bilaterally.  No wheezes, rales, or rhonchi.  No cyanosis, no use of accessory musculature GI: No  organomegaly, abdomen is soft and non-tender, positive bowel sounds.  No masses. Skin: No rashes. Neurologic: Facial musculature symmetric. Her facial expressionare slightly masked Psychiatric: Patient is appropriate throughout our interaction. Lymphatic: No cervical lymphadenopathy Musculoskeletal: Gait intact. Breast exam- No skin changes, no nipple cahnges/discharge Tenderness along right upper lateral quadrant, tender around areolar Right upper lateral breast lumps BReast tissue is very dense  LABS: Results for orders placed in visit on 01/01/14  TSH      Result Value Ref Range   TSH 2.096  0.350 - 4.500 uIU/mL  POCT CBC      Result Value Ref Range   WBC 7.1  4.6 - 10.2 K/uL   Lymph, poc 3.1  0.6 - 3.4   POC LYMPH PERCENT 44.3  10 - 50 %L   MID (cbc) 0.6  0 - 0.9   POC MID % 8.0  0 - 12 %M   POC Granulocyte 3.4  2 - 6.9   Granulocyte percent 47.7  37 - 80 %G   RBC 3.77 (*) 4.04 - 5.48 M/uL   Hemoglobin 11.1 (*) 12.2 - 16.2 g/dL   HCT, POC 36.5 (*) 37.7 - 47.9 %   MCV 96.8  80 - 97 fL   MCH, POC 29.4  27 - 31.2 pg   MCHC 30.4 (*) 31.8 - 35.4 g/dL   RDW, POC 13.8     Platelet Count, POC 259  142 - 424 K/uL   MPV 9.6  0 - 99.8 fL  POCT RAPID STREP A (OFFICE)      Result Value Ref Range   Rapid Strep A Screen Positive (*) Negative  POCT UA - MICROSCOPIC ONLY      Result Value Ref Range   WBC, Ur, HPF, POC 8-17     RBC, urine, microscopic 0-4     Bacteria, U Microscopic 1+     Mucus, UA mod     Epithelial cells, urine per micros 0-2     Crystals, Ur, HPF, POC neg     Casts, Ur, LPF, POC neg     Yeast, UA neg    POCT URINALYSIS DIPSTICK      Result Value Ref Range   Color, UA amber     Clarity, UA cloudy     Glucose, UA neg     Bilirubin, UA neg     Ketones, UA trace     Spec Grav, UA 1.025     Blood, UA trace-lysed     pH, UA 5.5     Protein, UA trace     Urobilinogen, UA 0.2     Nitrite, UA neg     Leukocytes, UA Negative  POCT GLYCOSYLATED HEMOGLOBIN  (HGB A1C)      Result Value Ref Range   Hemoglobin A1C 6.1       EKG/XRAY:   Primary read interpreted by Dr. Marin Comment at [facility].   ASSESSMENT/PLAN: Encounter Diagnoses  Name Primary?  . Breast lump in female Yes  . Breast pain in female    Refer to mammogram, large and dense breasts so will need to check which facility will be optimal for mammogram [facility] can accommodate Warm compresses/motrin prn for pain F/u prn  F/u prn  Gross sideeffects, risk and benefits, and alternatives of medications d/w patient. Patient is aware that all medications have potential sideeffects and we are unable to predict every sideeffect or drug-drug interaction that may occur.  , [place], DO 03/14/2014 9:24 PM

## 2014-03-17 ENCOUNTER — Telehealth: Payer: Self-pay

## 2014-03-17 ENCOUNTER — Ambulatory Visit (INDEPENDENT_AMBULATORY_CARE_PROVIDER_SITE_OTHER): Payer: Medicare Other | Admitting: Family Medicine

## 2014-03-17 VITALS — BP 134/80 | HR 75 | Temp 98.3°F | Resp 18 | Ht 59.0 in | Wt 292.0 lb

## 2014-03-17 DIAGNOSIS — N63 Unspecified lump in unspecified breast: Secondary | ICD-10-CM

## 2014-03-17 DIAGNOSIS — H6690 Otitis media, unspecified, unspecified ear: Secondary | ICD-10-CM | POA: Diagnosis not present

## 2014-03-17 DIAGNOSIS — R928 Other abnormal and inconclusive findings on diagnostic imaging of breast: Secondary | ICD-10-CM | POA: Diagnosis not present

## 2014-03-17 MED ORDER — CIPROFLOXACIN HCL 500 MG PO TABS: 500.0000 mg | ORAL_TABLET | Freq: Two times a day (BID) | ORAL | Status: DC

## 2014-03-17 NOTE — Progress Notes (Signed)
This chart was scribed for [name], MD by Vernell Barrier, Medical Scribe. This patient's care was started at 6:51 PM.   Patient ID: Brandi Gomez MRN: [id number], DOB: 1971-11-23, 43 y.o. Date of Encounter: 03/17/2014, 6:51 PM  Primary Physician: [name], MD  Chief Complaint: left ear blockage, sore throat, right breast lumps  HPI: 43 y.o. year old female with history below presents with left ear blockage and hearing disturbance. Also reports sore throat. States when she lays down, she feels like she has mucous in the back of her throat. Pt was also seen 3 days ago by Dr. Marin Comment and was supposed to be scheduled to have a mammogram completed but never received a follow up phone call. States there are 2 knots in the right breast that have increased in size and are tender.  Past Medical History  Diagnosis Date   Type II or unspecified type diabetes mellitus without mention of complication, not stated as uncontrolled    Hypertension    Sleep apnea    Anxiety    Depression    Bipolar 1 disorder    Seizures     intractable   Obesity    Mild mental retardation      Home Meds: Prior to Admission medications   Medication Sig Start Date End Date Taking? Authorizing Provider  citalopram (CELEXA) 20 MG tablet Take 2 tablets (40 mg total) by mouth daily. PATIENT NEEDS OFFICE VISIT FOR ADDITIONAL REFILLS 07/06/13  Yes [name], MD  DILANTIN 100 MG ER capsule 2 CAPSULES IN THE MORNING, 3 CAPSULES IN THE EVENING. BRAND IS MEDICALLY NECESSARY. 02/21/14  Yes [name], MD  DILANTIN INFATABS 50 MG tablet CHEW 2 TABLETS (100 MG TOTAL) BY MOUTH EVERY MORNING. FOR SEIZURE CONTROL 03/09/14  Yes [name], MD  ibuprofen (ADVIL,MOTRIN) 200 MG tablet Take 200 mg by mouth every 6 (six) hours as needed.   Yes Historical Provider, MD  sitaGLIPtin (JANUVIA) 50 MG tablet Take 1 tablet (50 mg total) by mouth daily. For control of blood sugar 02/24/13  Yes [name],  MD  torsemide (DEMADEX) 20 MG tablet Take 1 tablet (20 mg total) by mouth daily. 04/12/13  Yes [name], MD    Allergies:  Allergies  Allergen Reactions   Amoxicillin Itching   Hydrocodone Other (See Comments)    Depressed     History   Social History   Marital Status: Single    Spouse Name: N/A    Number of Children: N/A   Years of Education: N/A   Occupational History   disabled    Social History Main Topics   Smoking status: Never Smoker    Smokeless tobacco: Never Used   Alcohol Use: No   Drug Use: No   Sexual Activity: Not on file   Other Topics Concern   Not on file   Social History Narrative   No narrative on file     Review of Systems: Constitutional: negative for chills, fever, night sweats, weight changes, or fatigue  HEENT: negative for vision changes, hearing loss, congestion, rhinorrhea, ST, epistaxis, or sinus pressure. Positive for ear blockage, sore throat. Cardiovascular: negative for chest pain or palpitations Respiratory: negative for hemoptysis, wheezing, shortness of breath, or cough Abdominal: negative for abdominal pain, nausea, vomiting, diarrhea, or constipation Dermatological: negative for rash Neurologic: negative for headache, dizziness, or syncope Skin: Positive for breast lumps All other systems reviewed and are otherwise negative with the exception to those above and in the  HPI.   Physical Exam: Blood pressure 134/80, pulse 75, temperature 98.3 F (36.8 C), temperature source Oral, resp. rate 18, height 4\' 11"  (1.499 m), weight 292 lb (132.45 kg), last menstrual period 02/27/2014, SpO2 98.00%., Body mass index is 58.95 kg/(m^2). General: Well developed, well nourished, in no acute distress. Head: Normocephalic, atraumatic, eyes without discharge, sclera non-icteric, nares are without discharge. Bilateral auditory canals clear, TM's are without perforation, pearly grey and translucent with reflective cone of light  bilaterally. Oral cavity moist, posterior pharynx without exudate, erythema, peritonsillar abscess, or post nasal drip.  Neck: Supple. No thyromegaly. Full ROM. No lymphadenopathy. Lungs: Clear bilaterally to auscultation without wheezes, rales, or rhonchi. Breathing is unlabored. Heart: RRR with S1 S2. No murmurs, rubs, or gallops appreciated. Abdomen: Soft, non-tender, non-distended with normoactive bowel sounds. No hepatomegaly. No rebound/guarding. No obvious abdominal masses. Msk:  Strength and tone normal for age. Extremities/Skin: Warm and dry. No clubbing or cyanosis. No edema. No rashes or suspicious lesions. Neuro: Alert and oriented X 3. Moves all extremities spontaneously. Gait is normal. CNII-XII grossly in tact. Psych:  Responds to questions appropriately with a normal affect.   Patient could not tolerate irrigation of her left ear. She has wet serosanguineous fluid coming from the canal and I do not clearly see the tympanic membrane.  Examination right breast reveals no erythema and no definite mass was she has some nodularity.  ASSESSMENT AND PLAN:  43 y.o. year old female with difficult social situation. I don't know why she hasn't gotten her diagnostic mammogram at this point so we will pick that up in the morning. Otitis media - Plan: Ambulatory referral to ENT  Breast nodule    Signed, [name], MD 03/17/2014 6:51 PM

## 2014-03-17 NOTE — Telephone Encounter (Signed)
Dr Joseph Art check with referrals about mammogram referral. It is put in but pt hasn't had anyone call her.

## 2014-03-22 ENCOUNTER — Telehealth: Payer: Self-pay

## 2014-03-22 DIAGNOSIS — N63 Unspecified lump in unspecified breast: Secondary | ICD-10-CM

## 2014-03-22 DIAGNOSIS — R928 Other abnormal and inconclusive findings on diagnostic imaging of breast: Secondary | ICD-10-CM

## 2014-03-22 NOTE — Telephone Encounter (Signed)
Patient called upset on referral voicemail that she has not been scheduled for a mammogram yet. There are orders under appt tab (not in chart) but not in referral workque. Please fix if you can. I have faxed records to [facility] but they will need orders from Dr. Carlean Jews.

## 2014-03-23 NOTE — Telephone Encounter (Signed)
Order has been placed.

## 2014-03-31 DIAGNOSIS — N644 Mastodynia: Secondary | ICD-10-CM | POA: Diagnosis not present

## 2014-04-07 ENCOUNTER — Ambulatory Visit (INDEPENDENT_AMBULATORY_CARE_PROVIDER_SITE_OTHER): Payer: Medicare Other | Admitting: Family Medicine

## 2014-04-07 ENCOUNTER — Other Ambulatory Visit: Payer: Self-pay | Admitting: Family Medicine

## 2014-04-07 VITALS — BP 116/72 | HR 85 | Temp 99.1°F | Resp 18 | Ht 60.0 in | Wt 294.0 lb

## 2014-04-07 DIAGNOSIS — R635 Abnormal weight gain: Secondary | ICD-10-CM

## 2014-04-07 DIAGNOSIS — R5383 Other fatigue: Secondary | ICD-10-CM | POA: Diagnosis not present

## 2014-04-07 DIAGNOSIS — D649 Anemia, unspecified: Secondary | ICD-10-CM | POA: Diagnosis not present

## 2014-04-07 DIAGNOSIS — R5381 Other malaise: Secondary | ICD-10-CM

## 2014-04-07 LAB — POCT CBC
Granulocyte percent: 57 %G (ref 37–80)
HCT, POC: 33.2 % — AB (ref 37.7–47.9)
Hemoglobin: 10.4 g/dL — AB (ref 12.2–16.2)
Lymph, poc: 2.7 (ref 0.6–3.4)
MCH, POC: 29.9 pg (ref 27–31.2)
MCHC: 31.3 g/dL — AB (ref 31.8–35.4)
MCV: 95.5 fL (ref 80–97)
MID (cbc): 0.5 (ref 0–0.9)
MPV: 9.3 fL (ref 0–99.8)
POC Granulocyte: 4.2 (ref 2–6.9)
POC LYMPH PERCENT: 36.2 %L (ref 10–50)
POC MID %: 6.8 %M (ref 0–12)
Platelet Count, POC: 251 10*3/uL (ref 142–424)
RBC: 3.48 M/uL — AB (ref 4.04–5.48)
RDW, POC: 13.1 %
WBC: 7.4 10*3/uL (ref 4.6–10.2)

## 2014-04-07 NOTE — Progress Notes (Addendum)
Subjective:  This chart was scribed for Brandi Haber, MD by [name], ED Scribe. This patient was seen in room 4 and the patient's care was started at 6:28 PM.   Patient ID: Brandi Gomez, female    DOB: 08-14-1971, 43 y.o.   MRN: [id number]  Chief Complaint  Patient presents with   Fatigue    pt would like thyroid checked    HPI HPI Comments: [name] is a 43 y.o. female with a h/o DM type II and HTN who presents to the [facility] complaining of constant unchanged moderate fatigue. She also reports feeling cold all the time. States she would like her thyroid checked. She last had it checked in January and it was normal.  She also reports recent weight gain and resulting depression. She reports constant hunger and inability to feel satisfied after eating.  She reports reduced hearing from bilateral ears. She was seen here 3 weeks ago for left ear blockage.  PCP Brandi Haber, MD  Past Medical History  Diagnosis Date   Type II or unspecified type diabetes mellitus without mention of complication, not stated as uncontrolled    Hypertension    Sleep apnea    Anxiety    Depression    Bipolar 1 disorder    Seizures     intractable   Obesity    Mild mental retardation    Current Outpatient Prescriptions on File Prior to Visit  Medication Sig Dispense Refill   citalopram (CELEXA) 20 MG tablet Take 2 tablets (40 mg total) by mouth daily. PATIENT NEEDS OFFICE VISIT FOR ADDITIONAL REFILLS  60 tablet  1   DILANTIN 100 MG ER capsule 2 CAPSULES IN THE MORNING, 3 CAPSULES IN THE EVENING. BRAND IS MEDICALLY NECESSARY.  120 capsule  2   DILANTIN INFATABS 50 MG tablet CHEW 2 TABLETS (100 MG TOTAL) BY MOUTH EVERY MORNING. FOR SEIZURE CONTROL  60 tablet  2   ibuprofen (ADVIL,MOTRIN) 200 MG tablet Take 200 mg by mouth every 6 (six) hours as needed.       ciprofloxacin (CIPRO) 500 MG tablet Take 1 tablet (500 mg total) by mouth 2  (two) times daily.  14 tablet  0   sitaGLIPtin (JANUVIA) 50 MG tablet Take 1 tablet (50 mg total) by mouth daily. For control of blood sugar  30 tablet  5   torsemide (DEMADEX) 20 MG tablet Take 1 tablet (20 mg total) by mouth daily.  30 tablet  5   No current facility-administered medications on file prior to visit.   Allergies  Allergen Reactions   Amoxicillin Itching   Hydrocodone Other (See Comments)    Depressed    History  Substance Use Topics   Smoking status: Never Smoker    Smokeless tobacco: Never Used   Alcohol Use: No    Review of Systems  Constitutional: Positive for fatigue.  HENT: Positive for hearing loss.   Endocrine: Positive for cold intolerance.       Objective:   Physical Exam  Nursing note and vitals reviewed. Constitutional: She is oriented to person, place, and time. She appears well-developed and well-nourished. No distress.  HENT:  Head: Normocephalic and atraumatic.  Eyes: EOM are normal.  Cardiovascular: Normal rate.   Pulmonary/Chest: Effort normal. No respiratory distress.  Musculoskeletal: Normal range of motion.  Neurological: She is alert and oriented to person, place, and time.  Skin: Skin is warm and dry.  Psychiatric: She has a normal  mood and affect. Her behavior is normal.     Filed Vitals:   04/07/14 1806  BP: 116/72  Pulse: 85  Temp: 99.1 F (37.3 C)  TempSrc: Oral  Resp: 18  Height: 5' (1.524 m)  Weight: 294 lb (133.358 kg)  SpO2: 96%    Results for orders placed in visit on 04/07/14  POCT CBC      Result Value Ref Range   WBC 7.4  4.6 - 10.2 K/uL   Lymph, poc 2.7  0.6 - 3.4   POC LYMPH PERCENT 36.2  10 - 50 %L   MID (cbc) 0.5  0 - 0.9   POC MID % 6.8  0 - 12 %M   POC Granulocyte 4.2  2 - 6.9   Granulocyte percent 57.0  37 - 80 %G   RBC 3.48 (*) 4.04 - 5.48 M/uL   Hemoglobin 10.4 (*) 12.2 - 16.2 g/dL   HCT, POC 33.2 (*) 37.7 - 47.9 %   MCV 95.5  80 - 97 fL   MCH, POC 29.9  27 - 31.2 pg   MCHC 31.3 (*)  31.8 - 35.4 g/dL   RDW, POC 13.1     Platelet Count, POC 251  142 - 424 K/uL   MPV 9.3  0 - 99.8 fL       Assessment & Plan:    1. Fatigue   2. Weight gain   3. Anemia    Fatigue - Plan: POCT CBC, TSH, Lipase, Folate RBC  Weight gain - Plan: POCT CBC, TSH, Lipase, Folate RBC  Anemia - Plan: POCT CBC, TSH, Ferritin, Lipase, Folate RBC, CANCELED: Folate, RBC and Serum  Signed, Brandi Haber, MD   I personally performed the services described in this documentation, which was scribed in my presence. The recorded information has been reviewed and is accurate.

## 2014-04-07 NOTE — Patient Instructions (Signed)
I want you to take one Citrucel tablet with each meal to help satisfy your appetite

## 2014-04-08 LAB — TSH: TSH: 2.215 u[IU]/mL (ref 0.350–4.500)

## 2014-04-08 LAB — LIPASE: Lipase: 53 U/L (ref 0–75)

## 2014-04-08 LAB — FERRITIN: Ferritin: 36 ng/mL (ref 10–291)

## 2014-04-09 ENCOUNTER — Telehealth: Payer: Self-pay

## 2014-04-09 LAB — FOLATE RBC

## 2014-04-09 NOTE — Telephone Encounter (Signed)
[facility] called to say that a Lavendar top was not sent for the Folate RBC test that was ordered. Do you want pt to RTC?

## 2014-04-15 ENCOUNTER — Encounter: Payer: Self-pay | Admitting: Neurology

## 2014-04-27 ENCOUNTER — Telehealth: Payer: Self-pay

## 2014-04-27 DIAGNOSIS — D649 Anemia, unspecified: Secondary | ICD-10-CM

## 2014-04-27 NOTE — Telephone Encounter (Signed)
I would love to help her with her fatigue.  Some of her problem may be from anemia, which should respond to iron tablets.  However, this is not seeming to help.  Therefore, I will refer her to hematologist to see why.

## 2014-04-27 NOTE — Telephone Encounter (Signed)
Spoke with pt and she has been feeling "extremely fatigued for the past month or two." She has also had a couple headaches. She wants a prescription to help with her fatigue.  Please advise. [phone]

## 2014-04-27 NOTE — Addendum Note (Signed)
Addended by: [name] on: 04/27/2014 10:04 PM   Modules accepted: Orders

## 2014-04-27 NOTE — Telephone Encounter (Signed)
PT STATES SHE HAVE NO ENERGY AT ALL AND WOULD LIKE DR KURT TO CALL HER SOMETHING IN. PLEASE CALL PT AT [phone]    [facility] [address]

## 2014-04-28 ENCOUNTER — Encounter: Payer: Self-pay | Admitting: Family Medicine

## 2014-04-28 NOTE — Telephone Encounter (Signed)
Pt advised.

## 2014-05-03 ENCOUNTER — Telehealth: Payer: Self-pay | Admitting: Hematology and Oncology

## 2014-05-03 NOTE — Telephone Encounter (Signed)
S/W PATIENT AND GAVE NP APPT FOR 06/05 @ 10:30 W/DR. [place].  [place] PACK[facility] MAILED

## 2014-05-05 ENCOUNTER — Ambulatory Visit (INDEPENDENT_AMBULATORY_CARE_PROVIDER_SITE_OTHER): Payer: Medicare Other | Admitting: Family Medicine

## 2014-05-05 VITALS — BP 128/78 | HR 68 | Temp 99.2°F | Resp 20 | Ht 61.0 in | Wt 297.8 lb

## 2014-05-05 DIAGNOSIS — D649 Anemia, unspecified: Secondary | ICD-10-CM | POA: Diagnosis not present

## 2014-05-05 DIAGNOSIS — R109 Unspecified abdominal pain: Secondary | ICD-10-CM | POA: Diagnosis not present

## 2014-05-05 DIAGNOSIS — F329 Major depressive disorder, single episode, unspecified: Secondary | ICD-10-CM

## 2014-05-05 DIAGNOSIS — N946 Dysmenorrhea, unspecified: Secondary | ICD-10-CM | POA: Diagnosis not present

## 2014-05-05 DIAGNOSIS — R1030 Lower abdominal pain, unspecified: Secondary | ICD-10-CM

## 2014-05-05 DIAGNOSIS — R82998 Other abnormal findings in urine: Secondary | ICD-10-CM

## 2014-05-05 DIAGNOSIS — F3289 Other specified depressive episodes: Secondary | ICD-10-CM | POA: Diagnosis not present

## 2014-05-05 DIAGNOSIS — F32A Depression, unspecified: Secondary | ICD-10-CM

## 2014-05-05 DIAGNOSIS — R8281 Pyuria: Secondary | ICD-10-CM

## 2014-05-05 LAB — POCT CBC
Granulocyte percent: 58.2 %G (ref 37–80)
HCT, POC: 32.7 % — AB (ref 37.7–47.9)
Hemoglobin: 10.3 g/dL — AB (ref 12.2–16.2)
Lymph, poc: 2.6 (ref 0.6–3.4)
MCH, POC: 29.9 pg (ref 27–31.2)
MCHC: 31.5 g/dL — AB (ref 31.8–35.4)
MCV: 94.9 fL (ref 80–97)
MID (cbc): 0.5 (ref 0–0.9)
MPV: 9 fL (ref 0–99.8)
POC Granulocyte: 4.3 (ref 2–6.9)
POC LYMPH PERCENT: 34.8 %L (ref 10–50)
POC MID %: 7 %M (ref 0–12)
Platelet Count, POC: 242 10*3/uL (ref 142–424)
RBC: 3.45 M/uL — AB (ref 4.04–5.48)
RDW, POC: 14.1 %
WBC: 7.4 10*3/uL (ref 4.6–10.2)

## 2014-05-05 LAB — POCT URINALYSIS DIPSTICK
Bilirubin, UA: NEGATIVE
Glucose, UA: NEGATIVE
Ketones, UA: NEGATIVE
Nitrite, UA: NEGATIVE
Spec Grav, UA: 1.02
Urobilinogen, UA: 0.2
pH, UA: 6

## 2014-05-05 LAB — POCT UA - MICROSCOPIC ONLY
Casts, Ur, LPF, POC: NEGATIVE
Crystals, Ur, HPF, POC: NEGATIVE
Mucus, UA: NEGATIVE
Yeast, UA: NEGATIVE

## 2014-05-05 LAB — POCT URINE PREGNANCY: Preg Test, Ur: NEGATIVE

## 2014-05-05 MED ORDER — KETOROLAC TROMETHAMINE 60 MG/2ML IM SOLN
60.0000 mg | Freq: Once | INTRAMUSCULAR | Status: AC
  Administered 2014-05-05: 60 mg via INTRAMUSCULAR

## 2014-05-05 MED ORDER — CIPROFLOXACIN HCL 500 MG PO TABS
500.0000 mg | ORAL_TABLET | Freq: Two times a day (BID) | ORAL | Status: DC
Start: 2014-05-05 — End: 2014-05-22

## 2014-05-05 MED ORDER — ARIPIPRAZOLE 10 MG PO TABS: 10.0000 mg | ORAL_TABLET | Freq: Every day | ORAL | Status: DC

## 2014-05-05 NOTE — Progress Notes (Signed)
43 yo obese disabled woman with acute crampy lower abdominal pain consistent with dysmenorrhea.  She is late for her period.  She did have some spotting.  Patient complains of depression as well.  She has no energy.  She is supposed to see hematologist next week.  Objective:  NAD Abdomen:  Obese, minimal suprapubic tenderness, no mass palpated. Extrem:  Moving 4 extremities Results for orders placed in visit on 05/05/14  POCT URINALYSIS DIPSTICK      Result Value Ref Range   Color, UA yellow     Clarity, UA hazy     Glucose, UA neg     Bilirubin, UA neg     Ketones, UA neg     Spec Grav, UA 1.020     Blood, UA large     pH, UA 6.0     Protein, UA trace     Urobilinogen, UA 0.2     Nitrite, UA neg     Leukocytes, UA Trace    POCT URINE PREGNANCY      Result Value Ref Range   Preg Test, Ur Negative    POCT UA - MICROSCOPIC ONLY      Result Value Ref Range   WBC, Ur, HPF, POC 8-12     RBC, urine, microscopic 3-6     Bacteria, U Microscopic 1+     Mucus, UA neg     Epithelial cells, urine per micros 2-4     Crystals, Ur, HPF, POC neg     Casts, Ur, LPF, POC neg     Yeast, UA neg    POCT CBC      Result Value Ref Range   WBC 7.4  4.6 - 10.2 K/uL   Lymph, poc 2.6  0.6 - 3.4   POC LYMPH PERCENT 34.8  10 - 50 %L   MID (cbc) 0.5  0 - 0.9   POC MID % 7.0  0 - 12 %M   POC Granulocyte 4.3  2 - 6.9   Granulocyte percent 58.2  37 - 80 %G   RBC 3.45 (*) 4.04 - 5.48 M/uL   Hemoglobin 10.3 (*) 12.2 - 16.2 g/dL   HCT, POC 32.7 (*) 37.7 - 47.9 %   MCV 94.9  80 - 97 fL   MCH, POC 29.9  27 - 31.2 pg   MCHC 31.5 (*) 31.8 - 35.4 g/dL   RDW, POC 14.1     Platelet Count, POC 242  142 - 424 K/uL   MPV 9.0  0 - 99.8 fL     Assessment:   Anemia - Plan: POCT urinalysis dipstick, POCT urine pregnancy, POCT UA - Microscopic Only, POCT CBC  Dysmenorrhea - Plan: POCT urinalysis dipstick, POCT urine pregnancy, POCT CBC  Lower abdominal pain - Plan: POCT CBC, Urine culture  Depression -  Plan: ARIPiprazole (ABILIFY) 10 MG tablet  Pyuria - Plan: ciprofloxacin (CIPRO) 500 MG tablet  Signed, [name], MD

## 2014-05-07 LAB — URINE CULTURE: Colony Count: 50000

## 2014-05-09 ENCOUNTER — Other Ambulatory Visit: Payer: Self-pay | Admitting: Family Medicine

## 2014-05-09 NOTE — Telephone Encounter (Signed)
Dr L, you recently saw this pt for some chronic issues but I don't see this med/edema discussed. Do you want to RF?

## 2014-05-09 NOTE — Telephone Encounter (Signed)
Pt called in and states that Dr Joseph Art put her on Abilify but it not working and she needs something else. She can be reached at [phone]. Thank you

## 2014-05-10 ENCOUNTER — Telehealth: Payer: Self-pay

## 2014-05-10 NOTE — Telephone Encounter (Signed)
Please inform patient that any antidepressant usually takes 3 weeks to really work.  At this point, I would urge her to give it another week.

## 2014-05-10 NOTE — Telephone Encounter (Signed)
PT STATES THE ABILIFY 10MG S SHE WAS GIVEN FOR DEPRESSION ISN'T WORKING AND WOULD LIKE TO HAVE SOMETHING ELSE CALLED IN. PLEASE CALL PT AT [phone]     [facility] [address]

## 2014-05-10 NOTE — Telephone Encounter (Signed)
Gave pt inst's and she agreed. She will CB if still no improvement in another week or two.

## 2014-05-12 ENCOUNTER — Encounter (INDEPENDENT_AMBULATORY_CARE_PROVIDER_SITE_OTHER): Payer: Self-pay

## 2014-05-12 ENCOUNTER — Telehealth: Payer: Self-pay | Admitting: Neurology

## 2014-05-12 ENCOUNTER — Ambulatory Visit (INDEPENDENT_AMBULATORY_CARE_PROVIDER_SITE_OTHER): Payer: Medicare Other | Admitting: Neurology

## 2014-05-12 ENCOUNTER — Encounter: Payer: Self-pay | Admitting: Neurology

## 2014-05-12 VITALS — BP 137/87 | HR 78 | Wt 301.0 lb

## 2014-05-12 DIAGNOSIS — Z5181 Encounter for therapeutic drug level monitoring: Secondary | ICD-10-CM | POA: Diagnosis not present

## 2014-05-12 DIAGNOSIS — G40219 Localization-related (focal) (partial) symptomatic epilepsy and epileptic syndromes with complex partial seizures, intractable, without status epilepticus: Secondary | ICD-10-CM

## 2014-05-12 DIAGNOSIS — F411 Generalized anxiety disorder: Secondary | ICD-10-CM | POA: Diagnosis not present

## 2014-05-12 DIAGNOSIS — G4733 Obstructive sleep apnea (adult) (pediatric): Secondary | ICD-10-CM

## 2014-05-12 HISTORY — DX: Localization-related (focal) (partial) symptomatic epilepsy and epileptic syndromes with complex partial seizures, intractable, without status epilepticus: G40.219

## 2014-05-12 LAB — COMPREHENSIVE METABOLIC PANEL
ALT: 21 IU/L (ref 0–32)
AST: 18 IU/L (ref 0–40)
Albumin/Globulin Ratio: 1.1 (ref 1.1–2.5)
Albumin: 3.7 g/dL (ref 3.5–5.5)
Alkaline Phosphatase: 185 IU/L — ABNORMAL HIGH (ref 39–117)
BUN/Creatinine Ratio: 19 (ref 9–23)
BUN: 14 mg/dL (ref 6–24)
CO2: 29 mmol/L (ref 18–29)
Calcium: 9 mg/dL (ref 8.7–10.2)
Chloride: 102 mmol/L (ref 96–108)
Creatinine, Ser: 0.75 mg/dL (ref 0.57–1.00)
GFR calc Af Amer: 111 mL/min/{1.73_m2} (ref >59)
GFR calc non Af Amer: 97 mL/min/{1.73_m2} (ref >59)
Globulin, Total: 3.5 g/dL (ref 1.5–4.5)
Glucose: 143 mg/dL — ABNORMAL HIGH (ref 65–99)
POTASSIUM: 4 mmol/L (ref 3.5–5.2)
SODIUM: 139 mmol/L (ref 134–144)
TOTAL PROTEIN: 7.2 g/dL (ref 6.0–8.5)
Total Bilirubin: 0.2 mg/dL (ref 0.0–1.2)

## 2014-05-12 LAB — PHENYTOIN LEVEL, TOTAL: PHENYTOIN LVL: 18.9 ug/mL (ref 10.0–20.0)

## 2014-05-12 NOTE — Progress Notes (Signed)
Reason for visit: Seizures  Brandi Gomez is an 43 y.o. female  History of present illness:  Brandi Gomez is a 43 year old right-handed black female with a history of intractable partial complex type seizures. She indicates that she does not recall the seizure itself. She has had at least 2 seizures within the last month or so, and she will have episodes where she has lipsmacking, and then she will start singing. The event will usually last about 4 or 5 minutes, with resolution at that time. The patient remains on Dilantin taking 200 mg in the morning and 300 mg in the evening with a 50 mg tablet in the evening. She indicates that the 50 mg tablets upset her stomach. She is continuing to gain weight, and she remains on CPAP. She continues to have memory problems and excessive daytime drowsiness. She has been seen by Dr. Rexene Gomez for her sleep issues in the past. She returns to this office for an evaluation.  Past Medical History  Diagnosis Date  . Type II or unspecified type diabetes mellitus without mention of complication, not stated as uncontrolled   . Hypertension   . Sleep apnea   . Anxiety   . Depression   . Bipolar 1 disorder   . Seizures     intractable  . Obesity   . Mild mental retardation   . Partial complex seizure disorder with intractable epilepsy 05/12/2014    Past Surgical History  Procedure Laterality Date  . Nasal sinus surgery    . Myringotomy with tube placement    . Colonoscopy      2012-normal , Dr Brandi Gomez  . Esophagogastroduodenoscopy      normal-Dr Brandi Gomez 2012    Family History  Problem Relation Age of Onset  . Diabetes Mother     passed away from accidental death  . Mental illness Father   . Diabetes Daughter     Social history:  reports that she has never smoked. She has never used smokeless tobacco. She reports that she does not drink alcohol or use illicit drugs.    Allergies  Allergen Reactions  . Amoxicillin Itching  . Hydrocodone  Other (See Comments)    Depressed     Medications:  Current Outpatient Prescriptions on File Prior to Visit  Medication Sig Dispense Refill  . ARIPiprazole (ABILIFY) 10 MG tablet Take 1 tablet (10 mg total) by mouth daily.  30 tablet  3  . ciprofloxacin (CIPRO) 500 MG tablet Take 1 tablet (500 mg total) by mouth 2 (two) times daily.  14 tablet  0  . DILANTIN 100 MG ER capsule 2 CAPSULES IN THE MORNING, 3 CAPSULES IN THE EVENING. BRAND IS MEDICALLY NECESSARY.  120 capsule  2  . ibuprofen (ADVIL,MOTRIN) 200 MG tablet Take 200 mg by mouth every 6 (six) hours as needed.      . torsemide (DEMADEX) 20 MG tablet Take 1 tablet (20 mg total) by mouth daily.  30 tablet  5  . sitaGLIPtin (JANUVIA) 50 MG tablet Take 1 tablet (50 mg total) by mouth daily. For control of blood sugar  30 tablet  5   No current facility-administered medications on file prior to visit.    ROS:  Out of a complete 14 system review of symptoms, the patient complains only of the following symptoms, and all other reviewed systems are negative.  Fatigue Unexpected weight gain, excessive sweating Hearing loss Light sensitivity, blurred vision Cold intolerance, excessive eating Abdominal pain Apnea, daytime  sleepiness Frequent infections Frequency of urination Achy muscles, muscle cramps Skin rash Memory loss, numbness, seizures, weakness Agitation, confusion, decreased concentration, depression  Blood pressure 137/87, pulse 78, weight 301 lb (136.533 kg), last menstrual period 05/05/2014.  Physical Exam  General: The patient is alert and cooperative at the time of the examination. The patient is morbidly obese.  Skin: No significant peripheral edema is noted.   Neurologic Exam  Mental status: The Mini-Mental status examination done today shows a total score 24/30.  Cranial nerves: Facial symmetry is present. Speech is normal, no aphasia or dysarthria is noted. Extraocular movements are full. Visual fields are  full.  Motor: The patient has good strength in all 4 extremities.  Sensory examination: Soft touch sensation is symmetric on the face, arms, and legs.  Coordination: The patient has good finger-nose-finger and heel-to-shin bilaterally.  Gait and station: The patient has a normal gait. Tandem gait is unsteady. Romberg is negative. No drift is seen.  Reflexes: Deep tendon reflexes are symmetric.   Assessment/Plan:  1. Partial complex seizures, intractable  2. Morbid obesity  3. Sleep apnea on CPAP  4. Reported memory disturbance  The patient will be sent for blood work today. If the Dilantin level is adequate, the patient may have Topamax added to her regimen. She will follow up in 4 or 5 months. The patient continues to have excessive daytime drowsiness and memory problems likely related to her sleep apnea.  Brandi Alexanders MD 05/12/2014 3:25 PM  [facility] [address] [place], [place] [address]  Phone [phone] Fax [phone]

## 2014-05-12 NOTE — Telephone Encounter (Signed)
I called patient. The blood work shows an excellent Dilantin level of around 18. The chemistry profile shows an elevation in the alkaline phosphatase which is likely related to the Dilantin. I have talked to her about going on Topamax for seizures, she was on this medication in October 2013 at 100 mg twice daily, possibly being treated for headaches. The patient does not recall why she stopped the medication, she does not remember if she had a reaction to it or not. She will call her primary Dr., if there are no contraindications, this can be restarted.

## 2014-05-12 NOTE — Patient Instructions (Signed)
Epilepsy Epilepsy is a disorder in which a person has repeated seizures over time. A seizure is a release of abnormal electrical activity in the brain. Seizures can cause a change in attention, behavior, or the ability to remain awake and alert (altered mental status). Seizures often involve uncontrollable shaking (convulsions).  Most people with epilepsy lead normal lives. However, people with epilepsy are at an increased risk of falls, accidents, and injuries. Therefore, it is important to begin treatment right away. CAUSES  Epilepsy has many possible causes. Anything that disturbs the normal pattern of brain cell activity can lead to seizures. This may include:   Head injury.  Birth trauma.  High fever as a child.  Stroke.  Bleeding into or around the brain.  Certain drugs.  Prolonged low oxygen, such as what occurs after CPR efforts.  Abnormal brain development.  Certain illnesses, such as meningitis, encephalitis (brain infection), malaria, and other infections.  An imbalance of nerve signaling chemicals (neurotransmitters).  SIGNS AND SYMPTOMS  The symptoms of a seizure can vary greatly from one person to another. Right before a seizure, you may have a warning (aura) that a seizure is about to occur. An aura may include the following symptoms:  Fear or anxiety.  Nausea.  Feeling like the room is spinning (vertigo).  Vision changes, such as seeing flashing lights or spots. Common symptoms during a seizure include:  Abnormal sensations, such as an abnormal smell or a bitter taste in the mouth.   Sudden, general body stiffness.   Convulsions that involve rhythmic jerking of the face, arm, or leg on one or both sides.   Sudden change in consciousness.   Appearing to be awake but not responding.   Appearing to be asleep but cannot be awakened.   Grimacing, chewing, lip smacking, drooling, tongue biting, or loss of bowel or bladder control. After a seizure,  you may feel sleepy for a while. DIAGNOSIS  Your health care provider will ask about your symptoms and take a medical history. Descriptions from any witnesses to your seizures will be very helpful in the diagnosis. A physical exam, including a detailed neurological exam, is necessary. Various tests may be done, such as:   An electroencephalogram (EEG). This is a painless test of your brain waves. In this test, a diagram is created of your brain waves. These diagrams can be interpreted by a specialist.  An MRI of the brain.   A CT scan of the brain.   A spinal tap (lumbar puncture, LP).  Blood tests to check for signs of infection or abnormal blood chemistry. TREATMENT  There is no cure for epilepsy, but it is generally treatable. Once epilepsy is diagnosed, it is important to begin treatment as soon as possible. For most people with epilepsy, seizures can be controlled with medicines. The following may also be used:  A pacemaker for the brain (vagus nerve stimulator) can be used for people with seizures that are not well controlled by medicine.  Surgery on the brain. For some people, epilepsy eventually goes away. HOME CARE INSTRUCTIONS   Follow your health care provider's recommendations on driving and safety in normal activities.  Get enough rest. Lack of sleep can cause seizures.  Only take over-the-counter or prescription medicines as directed by your health care provider. Take any prescribed medicine exactly as directed.  Avoid any known triggers of your seizures.  Keep a seizure diary. Record what you recall about any seizure, especially any possible trigger.   Make   sure the people you live and work with know that you are prone to seizures. They should receive instructions on how to help you. In general, a witness to a seizure should:   Cushion your head and body.   Turn you on your side.   Avoid unnecessarily restraining you.   Not place anything inside your  mouth.   Call for emergency medical help if there is any question about what has occurred.   Follow up with your health care provider as directed. You may need regular blood tests to monitor the levels of your medicine.  SEEK MEDICAL CARE IF:   You develop signs of infection or other illness. This might increase the risk of a seizure.   You seem to be having more frequent seizures.   Your seizure pattern is changing.  SEEK IMMEDIATE MEDICAL CARE IF:   You have a seizure that does not stop after a few moments.   You have a seizure that causes any difficulty in breathing.   You have a seizure that results in a very severe headache.   You have a seizure that leaves you with the inability to speak or use a part of your body.  Document Released: 11/25/2005 Document Revised: 09/15/2013 Document Reviewed: 07/07/2013 [employer] Patient Information 2014 [employer].  

## 2014-05-13 ENCOUNTER — Ambulatory Visit (HOSPITAL_BASED_OUTPATIENT_CLINIC_OR_DEPARTMENT_OTHER): Payer: Medicare Other

## 2014-05-13 ENCOUNTER — Telehealth: Payer: Self-pay | Admitting: Hematology and Oncology

## 2014-05-13 ENCOUNTER — Ambulatory Visit: Payer: Medicare Other

## 2014-05-13 ENCOUNTER — Ambulatory Visit (HOSPITAL_BASED_OUTPATIENT_CLINIC_OR_DEPARTMENT_OTHER): Payer: Medicare Other | Admitting: Hematology and Oncology

## 2014-05-13 ENCOUNTER — Encounter: Payer: Self-pay | Admitting: Hematology and Oncology

## 2014-05-13 VITALS — BP 144/90 | HR 56 | Temp 98.1°F | Resp 20 | Ht 61.0 in | Wt 291.5 lb

## 2014-05-13 DIAGNOSIS — R5383 Other fatigue: Secondary | ICD-10-CM | POA: Insufficient documentation

## 2014-05-13 DIAGNOSIS — D638 Anemia in other chronic diseases classified elsewhere: Secondary | ICD-10-CM

## 2014-05-13 DIAGNOSIS — R5381 Other malaise: Secondary | ICD-10-CM | POA: Diagnosis not present

## 2014-05-13 DIAGNOSIS — D539 Nutritional anemia, unspecified: Secondary | ICD-10-CM

## 2014-05-13 LAB — IRON AND TIBC CHCC
%SAT: 14 % — AB (ref 21–57)
IRON: 35 ug/dL — AB (ref 41–142)
TIBC: 251 ug/dL (ref 236–444)
UIBC: 217 ug/dL (ref 120–384)

## 2014-05-13 LAB — CBC & DIFF AND RETIC
BASO%: 0.3 % (ref 0.0–2.0)
Basophils Absolute: 0 10*3/uL (ref 0.0–0.1)
EOS%: 3 % (ref 0.0–7.0)
Eosinophils Absolute: 0.2 10*3/uL (ref 0.0–0.5)
HCT: 35.1 % (ref 34.8–46.6)
HGB: 11.8 g/dL (ref 11.6–15.9)
IMMATURE RETIC FRACT: 3.2 % (ref 1.60–10.00)
LYMPH#: 2.2 10*3/uL (ref 0.9–3.3)
LYMPH%: 37 % (ref 14.0–49.7)
MCH: 30.6 pg (ref 25.1–34.0)
MCHC: 33.6 g/dL (ref 31.5–36.0)
MCV: 90.9 fL (ref 79.5–101.0)
MONO#: 0.4 10*3/uL (ref 0.1–0.9)
MONO%: 6.1 % (ref 0.0–14.0)
NEUT%: 53.6 % (ref 38.4–76.8)
NEUTROS ABS: 3.2 10*3/uL (ref 1.5–6.5)
NRBC: 0 % (ref 0–0)
PLATELETS: 259 10*3/uL (ref 145–400)
RBC: 3.86 10*6/uL (ref 3.70–5.45)
RDW: 13.2 % (ref 11.2–14.5)
RETIC CT ABS: 50.18 10*3/uL (ref 33.70–90.70)
Retic %: 1.3 % (ref 0.70–2.10)
WBC: 5.9 10*3/uL (ref 3.9–10.3)

## 2014-05-13 LAB — CHCC SMEAR

## 2014-05-13 LAB — VITAMIN B12: Vitamin B-12: 379 pg/mL (ref 211–911)

## 2014-05-13 LAB — LACTATE DEHYDROGENASE (CC13): LDH: 244 U/L (ref 125–245)

## 2014-05-13 NOTE — Progress Notes (Signed)
Checked in new patient with no financial issues. She has appt card and has not been out of the country. No issues at this time because she has not seen the dr.

## 2014-05-13 NOTE — Progress Notes (Signed)
[place] NOTE  Patient Care Team: [name], MD as PCP - General (Family Medicine) [name], MD (Neurology)  CHIEF COMPLAINTS/PURPOSE OF CONSULTATION:  Chronic anemia  HISTORY OF PRESENTING ILLNESS:  Brandi Gomez 43 y.o. female is here because of chronic anemia.  She was found to have abnormal CBC from recent blood work. Her hemoglobin range from 10.3 to within normal limits in 2013.  She denies recent chest pain on exertion. She has shortness of breath on minimal exertion but denies  pre-syncopal episodes or palpitations. She has profound fatigue and take naps all the time. She have occasional headaches and leg cramps.  She had not noticed any recent bleeding such as epistaxis, hematuria or hematochezia The patient denies regular over the counter NSAID ingestion. She is not on antiplatelets agents. Her last colonoscopy was 2012 then it was normal.   She had no prior history or diagnosis of cancer. Her age appropriate screening programs are up-to-date. She denies any pica and eats a variety of diet. She never donated blood or received blood transfusion The patient was not prescribed oral iron supplements. She complained of heavy menorrhagia over the last 2 months. She would have 4-5 days of heavy bleeding with a cycle of every 30 days.   MEDICAL HISTORY:  Past Medical History  Diagnosis Date  . Type II or unspecified type diabetes mellitus without mention of complication, not stated as uncontrolled   . Hypertension   . Sleep apnea   . Anxiety   . Depression   . Bipolar 1 disorder   . Seizures     intractable  . Obesity   . Mild mental retardation   . Partial complex seizure disorder with intractable epilepsy 05/12/2014  . Anemia     SURGICAL HISTORY: Past Surgical History  Procedure Laterality Date  . Nasal sinus surgery    . Myringotomy with tube placement    . Colonoscopy      2012-normal , Dr Sharlett Iles  .  Esophagogastroduodenoscopy      normal-Dr Sharlett Iles 2012    SOCIAL HISTORY: History   Social History  . Marital Status: Single    Spouse Name: N/A    Number of Children: N/A  . Years of Education: N/A   Occupational History  . disabled    Social History Main Topics  . Smoking status: Never Smoker   . Smokeless tobacco: Never Used  . Alcohol Use: No  . Drug Use: No  . Sexual Activity: Not on file   Other Topics Concern  . Not on file   Social History Narrative  . No narrative on file    FAMILY HISTORY: Family History  Problem Relation Age of Onset  . Diabetes Mother     passed away from accidental death  . Mental illness Father   . Diabetes Daughter   . Cancer Daughter     leukemia  . Cancer Maternal Aunt     ovarian ca    ALLERGIES:  is allergic to amoxicillin and hydrocodone.  MEDICATIONS:  Current Outpatient Prescriptions  Medication Sig Dispense Refill  . ARIPiprazole (ABILIFY) 10 MG tablet Take 1 tablet (10 mg total) by mouth daily.  30 tablet  3  . ciprofloxacin (CIPRO) 500 MG tablet Take 1 tablet (500 mg total) by mouth 2 (two) times daily.  14 tablet  0  . DILANTIN 100 MG ER capsule 2 CAPSULES IN THE MORNING, 3 CAPSULES IN THE EVENING. BRAND IS MEDICALLY NECESSARY.  120 capsule  2  . ibuprofen (ADVIL,MOTRIN) 200 MG tablet Take 200 mg by mouth every 6 (six) hours as needed.      . phenytoin (DILANTIN INFATABS) 50 MG tablet one tablet daily      . torsemide (DEMADEX) 20 MG tablet Take 1 tablet (20 mg total) by mouth daily.  30 tablet  5  . sitaGLIPtin (JANUVIA) 50 MG tablet Take 1 tablet (50 mg total) by mouth daily. For control of blood sugar  30 tablet  5   No current facility-administered medications for this visit.    REVIEW OF SYSTEMS:   Constitutional: Denies fevers, chills or abnormal night sweats Eyes: Denies blurriness of vision, double vision or watery eyes Ears, nose, mouth, throat, and face: Denies mucositis or sore  throat Gastrointestinal:  Denies nausea, heartburn or change in bowel habits Skin: Denies abnormal skin rashes Lymphatics: Denies new lymphadenopathy or easy bruising Neurological:Denies numbness, tingling or new weaknesses Behavioral/Psych: Mood is stable, no new changes  All other systems were reviewed with the patient and are negative.  PHYSICAL EXAMINATION: ECOG PERFORMANCE STATUS: 2 - Symptomatic, <50% confined to bed  Filed Vitals:   05/13/14 1102  BP: 144/90  Pulse: 56  Temp: 98.1 F (36.7 C)  Resp: 20   Filed Weights   05/13/14 1102  Weight: 291 lb 8 oz (132.224 kg)    GENERAL:alert, no distress and comfortable. She is morbidly obese  SKIN: skin color, texture, turgor are normal, no rashes or significant lesions EYES: normal, conjunctiva are  pale  and non-injected, sclera clear OROPHARYNX:no exudate, no erythema and lips, buccal mucosa, and tongue normal  NECK: supple, thyroid normal size, non-tender, without nodularity LYMPH:  no palpable lymphadenopathy in the cervical, axillary or inguinal LUNGS: clear to auscultation and percussion with normal breathing effort HEART: regular rate & rhythm and no murmurs and no lower extremity edema ABDOMEN:abdomen soft, non-tender and normal bowel sounds. Unable to appreciate hepatosplenomegaly due to morbid obesity.  Musculoskeletal:no cyanosis of digits and no clubbing  PSYCH: alert & oriented x 3 with fluent speech NEURO: no focal motor/sensory deficits  LABORATORY DATA:  I have reviewed the data as listed Recent Results (from the past 2160 hour(s))  TSH     Status: None   Collection Time    04/07/14  7:26 PM      Result Value Ref Range   TSH 2.215  0.350 - 4.500 uIU/mL  POCT CBC     Status: Abnormal   Collection Time    04/07/14  7:27 PM      Result Value Ref Range   WBC 7.4  4.6 - 10.2 K/uL   Lymph, poc 2.7  0.6 - 3.4   POC LYMPH PERCENT 36.2  10 - 50 %L   MID (cbc) 0.5  0 - 0.9   POC MID % 6.8  0 - 12 %M   POC  Granulocyte 4.2  2 - 6.9   Granulocyte percent 57.0  37 - 80 %G   RBC 3.48 (*) 4.04 - 5.48 M/uL   Hemoglobin 10.4 (*) 12.2 - 16.2 g/dL   HCT, POC 33.2 (*) 37.7 - 47.9 %   MCV 95.5  80 - 97 fL   MCH, POC 29.9  27 - 31.2 pg   MCHC 31.3 (*) 31.8 - 35.4 g/dL   RDW, POC 13.1     Platelet Count, POC 251  142 - 424 K/uL   MPV 9.3  0 - 99.8 fL  FERRITIN  Status: None   Collection Time    04/07/14  7:57 PM      Result Value Ref Range   Ferritin 36  10 - 291 ng/mL  LIPASE     Status: None   Collection Time    04/07/14  8:00 PM      Result Value Ref Range   Lipase 53  0 - 75 U/L  FOLATE RBC     Status: None   Collection Time    04/07/14  8:00 PM      Result Value Ref Range   RBC Folate TEST NOT PERFORMED  >280 ng/mL   Comment: Reference range not established for pediatric patients.  POCT URINALYSIS DIPSTICK     Status: None   Collection Time    05/05/14  7:13 PM      Result Value Ref Range   Color, UA yellow     Clarity, UA hazy     Glucose, UA neg     Bilirubin, UA neg     Ketones, UA neg     Spec Grav, UA 1.020     Blood, UA large     pH, UA 6.0     Protein, UA trace     Urobilinogen, UA 0.2     Nitrite, UA neg     Leukocytes, UA Trace    POCT URINE PREGNANCY     Status: None   Collection Time    05/05/14  7:13 PM      Result Value Ref Range   Preg Test, Ur Negative    POCT UA - MICROSCOPIC ONLY     Status: None   Collection Time    05/05/14  7:13 PM      Result Value Ref Range   WBC, Ur, HPF, POC 8-12     RBC, urine, microscopic 3-6     Bacteria, U Microscopic 1+     Mucus, UA neg     Epithelial cells, urine per micros 2-4     Crystals, Ur, HPF, POC neg     Casts, Ur, LPF, POC neg     Yeast, UA neg    POCT CBC     Status: Abnormal   Collection Time    05/05/14  7:13 PM      Result Value Ref Range   WBC 7.4  4.6 - 10.2 K/uL   Lymph, poc 2.6  0.6 - 3.4   POC LYMPH PERCENT 34.8  10 - 50 %L   MID (cbc) 0.5  0 - 0.9   POC MID % 7.0  0 - 12 %M   POC  Granulocyte 4.3  2 - 6.9   Granulocyte percent 58.2  37 - 80 %G   RBC 3.45 (*) 4.04 - 5.48 M/uL   Hemoglobin 10.3 (*) 12.2 - 16.2 g/dL   HCT, POC 32.7 (*) 37.7 - 47.9 %   MCV 94.9  80 - 97 fL   MCH, POC 29.9  27 - 31.2 pg   MCHC 31.5 (*) 31.8 - 35.4 g/dL   RDW, POC 14.1     Platelet Count, POC 242  142 - 424 K/uL   MPV 9.0  0 - 99.8 fL  URINE CULTURE     Status: None   Collection Time    05/05/14  9:19 PM      Result Value Ref Range   Colony Count 50,000 COLONIES/ML     Organism ID, Bacteria Multiple bacterial morphotypes present, none  Organism ID, Bacteria predominant. Suggest appropriate recollection if      Organism ID, Bacteria clinically indicated.    PHENYTOIN LEVEL, TOTAL     Status: None   Collection Time    05/12/14  2:51 PM      Result Value Ref Range   Phenytoin Lvl 18.9  10.0 - 20.0 ug/mL   Comment:                                 Neonatal:                                     Therapeutic 6.0 - 14.0                                     Detection Limit =  0.6                               <0.6 Indicates None Detected  COMPREHENSIVE METABOLIC PANEL     Status: Abnormal   Collection Time    05/12/14  2:51 PM      Result Value Ref Range   Glucose 143 (*) 65 - 99 mg/dL   BUN 14  6 - 24 mg/dL   Creatinine, Ser 0.75  0.57 - 1.00 mg/dL   GFR calc non Af Amer 97  >59 mL/min/1.73   GFR calc Af Amer 111  >59 mL/min/1.73   BUN/Creatinine Ratio 19  9 - 23   Sodium 139  134 - 144 mmol/L   Potassium 4.0  3.5 - 5.2 mmol/L   Chloride 102  96 - 108 mmol/L   CO2 29  18 - 29 mmol/L   Calcium 9.0  8.7 - 10.2 mg/dL   Total Protein 7.2  6.0 - 8.5 g/dL   Albumin 3.7  3.5 - 5.5 g/dL   Globulin, Total 3.5  1.5 - 4.5 g/dL   Albumin/Globulin Ratio 1.1  1.1 - 2.5   Total Bilirubin 0.2  0.0 - 1.2 mg/dL   Comment: **Result Repeated**   Alkaline Phosphatase 185 (*) 39 - 117 IU/L   AST 18  0 - 40 IU/L   ALT 21  0 - 32 IU/L  CBC & DIFF AND RETIC     Status: None   Collection Time     05/13/14 11:52 AM      Result Value Ref Range   WBC 5.9  3.9 - 10.3 10e3/uL   NEUT# 3.2  1.5 - 6.5 10e3/uL   HGB 11.8  11.6 - 15.9 g/dL   HCT 35.1  34.8 - 46.6 %   Platelets 259  145 - 400 10e3/uL   MCV 90.9  79.5 - 101.0 fL   MCH 30.6  25.1 - 34.0 pg   MCHC 33.6  31.5 - 36.0 g/dL   RBC 3.86  3.70 - 5.45 10e6/uL   RDW 13.2  11.2 - 14.5 %   lymph# 2.2  0.9 - 3.3 10e3/uL   MONO# 0.4  0.1 - 0.9 10e3/uL   Eosinophils Absolute 0.2  0.0 - 0.5 10e3/uL   Basophils Absolute 0.0  0.0 - 0.1 10e3/uL   NEUT% 53.6  38.4 - 76.8 %   LYMPH% 37.0  14.0 -  49.7 %   MONO% 6.1  0.0 - 14.0 %   EOS% 3.0  0.0 - 7.0 %   BASO% 0.3  0.0 - 2.0 %   nRBC 0  0 - 0 %   Retic % 1.30  0.70 - 2.10 %   Retic Ct Abs 50.18  33.70 - 90.70 10e3/uL   Immature Retic Fract 3.20  1.60 - 10.00 %  [facility] SMEAR     Status: None   Collection Time    05/13/14 11:53 AM      Result Value Ref Range   Smear Result Smear Available     ASSESSMENT & PLAN:  Unspecified deficiency anemia This is likely anemia of chronic disease. The patient denies recent history of bleeding such as epistaxis, hematuria or hematochezia. She is asymptomatic from the anemia. We will observe for now.  She does not require transfusion now.  I will order an additional workup for this.  Fatigue I suspect this is due to her medication. The patient also has obstructive sleep apnea. The most recent thyroid function tests were normal. I do not think the fatigue is due to anemia. I reassured the patient.     All questions were answered. The patient knows to call the clinic with any problems, questions or concerns. I spent 40 minutes counseling the patient face to face. The total time spent in the appointment was 55 minutes and more than 50% was on counseling.     Heath Lark, MD 05/13/2014 12:55 PM

## 2014-05-13 NOTE — Assessment & Plan Note (Signed)
This is likely anemia of chronic disease. The patient denies recent history of bleeding such as epistaxis, hematuria or hematochezia. She is asymptomatic from the anemia. We will observe for now.  She does not require transfusion now.  I will order an additional workup for this.

## 2014-05-13 NOTE — Assessment & Plan Note (Signed)
I suspect this is due to her medication. The patient also has obstructive sleep apnea. The most recent thyroid function tests were normal. I do not think the fatigue is due to anemia. I reassured the patient.

## 2014-05-13 NOTE — Telephone Encounter (Signed)
gv adn printed appt sched and avs for pt for June...sent pt to lab.. °

## 2014-05-14 LAB — DIRECT ANTIGLOBULIN TEST (NOT AT ARMC)
DAT (Complement): NEGATIVE
DAT IgG: NEGATIVE

## 2014-05-14 LAB — SEDIMENTATION RATE: Sed Rate: 58 mm/hr — ABNORMAL HIGH (ref 0–22)

## 2014-05-16 ENCOUNTER — Telehealth: Payer: Self-pay

## 2014-05-16 NOTE — Telephone Encounter (Signed)
DR. L - Pt said she has hospital bed that she doesn't need any more.  She said we have to call [facility] before they will come pick it up.  Please call them at  [phone].  Her number is [phone]

## 2014-05-17 NOTE — Telephone Encounter (Signed)
Called [facility]- the system is down and they are unable to take an order at this time. Suggested we call back.

## 2014-05-18 ENCOUNTER — Other Ambulatory Visit: Payer: Self-pay | Admitting: Neurology

## 2014-05-18 NOTE — Telephone Encounter (Signed)
Called [facility] to pick up bed.

## 2014-05-19 ENCOUNTER — Other Ambulatory Visit: Payer: Self-pay | Admitting: Neurology

## 2014-05-22 ENCOUNTER — Ambulatory Visit (INDEPENDENT_AMBULATORY_CARE_PROVIDER_SITE_OTHER): Payer: Medicare Other | Admitting: Family Medicine

## 2014-05-22 VITALS — BP 120/80 | HR 80 | Temp 98.2°F | Resp 18 | Ht 60.0 in | Wt 297.8 lb

## 2014-05-22 DIAGNOSIS — F329 Major depressive disorder, single episode, unspecified: Secondary | ICD-10-CM | POA: Diagnosis not present

## 2014-05-22 DIAGNOSIS — F32A Depression, unspecified: Secondary | ICD-10-CM

## 2014-05-22 DIAGNOSIS — F3289 Other specified depressive episodes: Secondary | ICD-10-CM | POA: Diagnosis not present

## 2014-05-22 DIAGNOSIS — R8281 Pyuria: Secondary | ICD-10-CM

## 2014-05-22 DIAGNOSIS — R82998 Other abnormal findings in urine: Secondary | ICD-10-CM

## 2014-05-22 DIAGNOSIS — L0390 Cellulitis, unspecified: Secondary | ICD-10-CM

## 2014-05-22 DIAGNOSIS — R300 Dysuria: Secondary | ICD-10-CM | POA: Diagnosis not present

## 2014-05-22 DIAGNOSIS — G43909 Migraine, unspecified, not intractable, without status migrainosus: Secondary | ICD-10-CM

## 2014-05-22 DIAGNOSIS — R350 Frequency of micturition: Secondary | ICD-10-CM

## 2014-05-22 DIAGNOSIS — L0291 Cutaneous abscess, unspecified: Secondary | ICD-10-CM

## 2014-05-22 LAB — POCT UA - MICROSCOPIC ONLY
Casts, Ur, LPF, POC: NEGATIVE
Crystals, Ur, HPF, POC: NEGATIVE
Mucus, UA: NEGATIVE
Yeast, UA: NEGATIVE

## 2014-05-22 LAB — POCT URINALYSIS DIPSTICK
Bilirubin, UA: NEGATIVE
Blood, UA: NEGATIVE
Glucose, UA: NEGATIVE
Ketones, UA: NEGATIVE
Nitrite, UA: NEGATIVE
Spec Grav, UA: 1.015
Urobilinogen, UA: 0.2
pH, UA: 5.5

## 2014-05-22 MED ORDER — TOPIRAMATE 50 MG PO TABS: 50.0000 mg | ORAL_TABLET | Freq: Two times a day (BID) | ORAL | Status: DC

## 2014-05-22 MED ORDER — CIPROFLOXACIN HCL 500 MG PO TABS: 500.0000 mg | ORAL_TABLET | Freq: Two times a day (BID) | ORAL | Status: DC

## 2014-05-22 MED ORDER — FLUOXETINE HCL 20 MG PO TABS: 20.0000 mg | ORAL_TABLET | Freq: Every day | ORAL | Status: DC

## 2014-05-22 NOTE — Patient Instructions (Signed)
Urinary Tract Infection  Urinary tract infections (UTIs) can develop anywhere along your urinary tract. Your urinary tract is your body's drainage system for removing wastes and extra water. Your urinary tract includes two kidneys, two ureters, a bladder, and a urethra. Your kidneys are a pair of bean-shaped organs. Each kidney is about the size of your fist. They are located below your ribs, one on each side of your spine.  CAUSES  Infections are caused by microbes, which are microscopic organisms, including fungi, viruses, and bacteria. These organisms are so small that they can only be seen through a microscope. Bacteria are the microbes that most commonly cause UTIs.  SYMPTOMS   Symptoms of UTIs may vary by age and gender of the patient and by the location of the infection. Symptoms in young women typically include a frequent and intense urge to urinate and a painful, burning feeling in the bladder or urethra during urination. Older women and men are more likely to be tired, shaky, and weak and have muscle aches and abdominal pain. A fever may mean the infection is in your kidneys. Other symptoms of a kidney infection include pain in your back or sides below the ribs, nausea, and vomiting.  DIAGNOSIS  To diagnose a UTI, your caregiver will ask you about your symptoms. Your caregiver also will ask to provide a urine sample. The urine sample will be tested for bacteria and white blood cells. White blood cells are made by your body to help fight infection.  TREATMENT   Typically, UTIs can be treated with medication. Because most UTIs are caused by a bacterial infection, they usually can be treated with the use of antibiotics. The choice of antibiotic and length of treatment depend on your symptoms and the type of bacteria causing your infection.  HOME CARE INSTRUCTIONS   If you were prescribed antibiotics, take them exactly as your caregiver instructs you. Finish the medication even if you feel better after you  have only taken some of the medication.   Drink enough water and fluids to keep your urine clear or pale yellow.   Avoid caffeine, tea, and carbonated beverages. They tend to irritate your bladder.   Empty your bladder often. Avoid holding urine for long periods of time.   Empty your bladder before and after sexual intercourse.   After a bowel movement, women should cleanse from front to back. Use each tissue only once.  SEEK MEDICAL CARE IF:    You have back pain.   You develop a fever.   Your symptoms do not begin to resolve within 3 days.  SEEK IMMEDIATE MEDICAL CARE IF:    You have severe back pain or lower abdominal pain.   You develop chills.   You have nausea or vomiting.   You have continued burning or discomfort with urination.  MAKE SURE YOU:    Understand these instructions.   Will watch your condition.   Will get help right away if you are not doing well or get worse.  Document Released: 09/04/2005 Document Revised: 05/26/2012 Document Reviewed: 01/03/2012  [employer] Patient Information 2014 [employer].

## 2014-05-22 NOTE — Progress Notes (Signed)
Subjective:    Patient ID: Brandi Gomez, female    DOB: 09-22-1971, 42 y.o.   MRN: [id number]  Urinary Tract Infection  Associated symptoms include frequency.   Chief Complaint  Patient presents with   Urinary Tract Infection    per pt stopped burning yesterday    This chart was scribed for Brandi Haber, MD by [name], ED Scribe. This patient was seen in room 12 and the patient's care was started at 12:42 PM.  HPI Comments: Brandi Gomez is a 43 y.o. female who presents to the [facility] complaining of a possible UTI with associated dysuria and frequency onset 3 days.    Pt also s/o an infected hair bump on pelvic area.  Pt states she has lost 4lb. She states she has been depressed due to the abilify. She reports she has stopped taking the medication.    Patient Active Problem List   Diagnosis Date Noted   Unspecified deficiency anemia 05/13/2014   Fatigue 05/13/2014   Partial complex seizure disorder with intractable epilepsy 05/12/2014   OSA (obstructive sleep apnea) 03/09/2013   Encounter for therapeutic drug monitoring 10/16/2012   Generalized convulsive epilepsy without mention of intractable epilepsy 10/16/2012   Generalized anxiety disorder 06/23/2012   Unspecified essential hypertension 07/27/2008   HEADACHE 12/11/2007   VARICOSE VEIN 05/08/2007   ANKLE EDEMA 05/08/2007   VULVAR CYST 04/23/2007   SKIN LESION 04/01/2007   OBESITY, NOS 02/05/2007   DEPRESSION, MAJOR, RECURRENT 02/05/2007   MEMORY LOSS 02/05/2007   OSTEOARTHRITIS OF SPINE, NOS 02/05/2007   CONVULSIONS, SEIZURES, NOS 02/05/2007   APNEA, SLEEP 02/05/2007   Past Medical History  Diagnosis Date   Type II or unspecified type diabetes mellitus without mention of complication, not stated as uncontrolled    Hypertension    Sleep apnea    Anxiety    Depression    Bipolar 1 disorder    Seizures     intractable   Obesity    Mild  mental retardation    Partial complex seizure disorder with intractable epilepsy 05/12/2014   Anemia    Allergies  Allergen Reactions   Amoxicillin Itching   Hydrocodone Other (See Comments)    Depressed    Prior to Admission medications   Medication Sig Start Date End Date Taking? Authorizing Provider  ciprofloxacin (CIPRO) 500 MG tablet Take 1 tablet (500 mg total) by mouth 2 (two) times daily. 05/05/14  Yes Brandi Haber, MD  DILANTIN 100 MG ER capsule TAKE 2 CAPSULES IN THE MORNING AND 3 CAPSULES IN THE EVENING 05/18/14  Yes [name], MD  DILANTIN 100 MG ER capsule TAKE 2 CAPSULES IN THE MORNING AND 3 CAPSULES IN THE EVENING 05/19/14  Yes [name], MD  ibuprofen (ADVIL,MOTRIN) 200 MG tablet Take 200 mg by mouth every 6 (six) hours as needed.   Yes Historical Provider, MD  phenytoin (DILANTIN INFATABS) 50 MG tablet one tablet daily 03/09/14  Yes [name], MD  sitaGLIPtin (JANUVIA) 50 MG tablet Take 1 tablet (50 mg total) by mouth daily. For control of blood sugar 02/24/13  Yes Brandi Haber, MD  torsemide (DEMADEX) 20 MG tablet Take 1 tablet (20 mg total) by mouth daily.   Yes Brandi Haber, MD  ARIPiprazole (ABILIFY) 10 MG tablet Take 1 tablet (10 mg total) by mouth daily. 05/05/14   Brandi Haber, MD   Review of Systems  Genitourinary: Positive for dysuria and frequency.  Psychiatric/Behavioral: Positive  for dysphoric mood.     Objective:   Physical Exam  Nursing note and vitals reviewed. Constitutional: She is oriented to person, place, and time. She appears well-developed and well-nourished. No distress.  HENT:  Head: Normocephalic and atraumatic.  Eyes: Conjunctivae and EOM are normal.  Neck: Normal range of motion.  Cardiovascular: Normal rate, regular rhythm and normal heart sounds.  Exam reveals no gallop and no friction rub.   No murmur heard. Musculoskeletal: Normal range of motion.  Neurological: She is alert and oriented to person, place,  and time.  Skin: Skin is warm and dry.  Psychiatric: She has a normal mood and affect. Her behavior is normal.   Results for orders placed in visit on 05/22/14  POCT UA - MICROSCOPIC ONLY      Result Value Ref Range   WBC, Ur, HPF, POC 5-15     RBC, urine, microscopic 2-3     Bacteria, U Microscopic 1+     Mucus, UA neg     Epithelial cells, urine per micros 10-20     Crystals, Ur, HPF, POC neg     Casts, Ur, LPF, POC neg     Yeast, UA neg    POCT URINALYSIS DIPSTICK      Result Value Ref Range   Color, UA yellow     Clarity, UA cloudy     Glucose, UA neg     Bilirubin, UA neg     Ketones, UA neg     Spec Grav, UA 1.015     Blood, UA neg     pH, UA 5.5     Protein, UA trace     Urobilinogen, UA 0.2     Nitrite, UA neg     Leukocytes, UA small (1+)      Assessment & Plan:   1. Urinary frequency   2. Dysuria   3. Depression   4. Pyuria      Meds ordered this encounter  Medications   FLUoxetine (PROZAC) 20 MG tablet    Sig: Take 1 tablet (20 mg total) by mouth daily.    Dispense:  30 tablet    Refill:  3   ciprofloxacin (CIPRO) 500 MG tablet    Sig: Take 1 tablet (500 mg total) by mouth 2 (two) times daily.    Dispense:  10 tablet    Refill:  0   Brandi Haber, MD

## 2014-06-03 ENCOUNTER — Ambulatory Visit: Payer: Self-pay | Admitting: Hematology and Oncology

## 2014-06-13 ENCOUNTER — Other Ambulatory Visit: Payer: Self-pay | Admitting: Neurology

## 2014-07-03 ENCOUNTER — Ambulatory Visit (INDEPENDENT_AMBULATORY_CARE_PROVIDER_SITE_OTHER): Payer: Medicare Other | Admitting: Family Medicine

## 2014-07-03 VITALS — BP 128/74 | HR 79 | Temp 98.2°F | Resp 16 | Ht 60.5 in | Wt 296.4 lb

## 2014-07-03 DIAGNOSIS — L710 Perioral dermatitis: Secondary | ICD-10-CM

## 2014-07-03 DIAGNOSIS — G43909 Migraine, unspecified, not intractable, without status migrainosus: Secondary | ICD-10-CM | POA: Diagnosis not present

## 2014-07-03 DIAGNOSIS — L748 Other eccrine sweat disorders: Secondary | ICD-10-CM | POA: Diagnosis not present

## 2014-07-03 DIAGNOSIS — L750 Bromhidrosis: Secondary | ICD-10-CM

## 2014-07-03 DIAGNOSIS — L719 Rosacea, unspecified: Secondary | ICD-10-CM | POA: Diagnosis not present

## 2014-07-03 DIAGNOSIS — R350 Frequency of micturition: Secondary | ICD-10-CM

## 2014-07-03 LAB — POCT UA - MICROSCOPIC ONLY
Casts, Ur, LPF, POC: NEGATIVE
Crystals, Ur, HPF, POC: NEGATIVE
Mucus, UA: NEGATIVE
Yeast, UA: NEGATIVE

## 2014-07-03 LAB — POCT URINALYSIS DIPSTICK
Glucose, UA: NEGATIVE
Ketones, UA: NEGATIVE
Nitrite, UA: NEGATIVE
Spec Grav, UA: 1.025
Urobilinogen, UA: 0.2
pH, UA: 5.5

## 2014-07-03 MED ORDER — METRONIDAZOLE 500 MG PO TABS: 500.0000 mg | ORAL_TABLET | Freq: Three times a day (TID) | ORAL | Status: DC

## 2014-07-03 MED ORDER — CLINDAMYCIN PHOSPHATE 1 % EX SOLN: Freq: Two times a day (BID) | CUTANEOUS | Status: DC

## 2014-07-03 NOTE — Progress Notes (Addendum)
This 43 year old woman with multiple problems comes in with 1 week of foul-smelling urine. She's had this problem before and it has responded to Flagyl.  Patient has no dysuria but she does have some increased frequency in urination. Patient also denies fever or back pain  Patient is also been breaking out on her face around her mouth and nose. This is been occurring over the last month or 2. She has no pain and no itching.  Objective: No acute distress Patient is seen with her friend [name] No CVAT Chest clear Heart reg, no murmur Facial blemishes (4) without significant erythema, only 1 mm scaly areas  Results for orders placed in visit on 07/03/14  POCT URINALYSIS DIPSTICK      Result Value Ref Range   Color, UA dark yellow     Clarity, UA cloudy     Glucose, UA neg     Bilirubin, UA small     Ketones, UA neg     Spec Grav, UA 1.025     Blood, UA trace-lysed     pH, UA 5.5     Protein, UA trace     Urobilinogen, UA 0.2     Nitrite, UA neg     Leukocytes, UA Trace    POCT UA - MICROSCOPIC ONLY      Result Value Ref Range   WBC, Ur, HPF, POC 3-15     RBC, urine, microscopic 0-2     Bacteria, U Microscopic trace     Mucus, UA neg     Epithelial cells, urine per micros 1-5     Crystals, Ur, HPF, POC neg     Casts, Ur, LPF, POC neg     Yeast, UA neg     Assessment: I suspect patient has a bacterial vaginitis.  Increased frequency of urination - Plan: POCT urinalysis dipstick, POCT UA - Microscopic Only, metroNIDAZOLE (FLAGYL) 500 MG tablet  Urinary body odor - Plan: metroNIDAZOLE (FLAGYL) 500 MG tablet  Mild peri-oral dermatitis  Cleocin solution bid   [name], MD

## 2014-07-03 NOTE — Addendum Note (Signed)
Addended by: [name] on: 07/03/2014 03:35 PM   Modules accepted: Orders, Level of Service

## 2014-08-07 ENCOUNTER — Other Ambulatory Visit: Payer: Self-pay | Admitting: Neurology

## 2014-08-26 ENCOUNTER — Ambulatory Visit (INDEPENDENT_AMBULATORY_CARE_PROVIDER_SITE_OTHER): Payer: Medicare Other | Admitting: Family Medicine

## 2014-08-26 VITALS — BP 120/80 | HR 81 | Temp 97.9°F | Resp 16 | Ht 62.5 in | Wt 293.0 lb

## 2014-08-26 DIAGNOSIS — E669 Obesity, unspecified: Secondary | ICD-10-CM

## 2014-08-26 DIAGNOSIS — R350 Frequency of micturition: Secondary | ICD-10-CM

## 2014-08-26 DIAGNOSIS — L750 Bromhidrosis: Secondary | ICD-10-CM

## 2014-08-26 DIAGNOSIS — E1169 Type 2 diabetes mellitus with other specified complication: Secondary | ICD-10-CM

## 2014-08-26 DIAGNOSIS — E119 Type 2 diabetes mellitus without complications: Secondary | ICD-10-CM

## 2014-08-26 DIAGNOSIS — Z23 Encounter for immunization: Secondary | ICD-10-CM

## 2014-08-26 DIAGNOSIS — L748 Other eccrine sweat disorders: Secondary | ICD-10-CM

## 2014-08-26 LAB — POCT UA - MICROSCOPIC ONLY
BACTERIA, U MICROSCOPIC: NEGATIVE
Casts, Ur, LPF, POC: NEGATIVE
Crystals, Ur, HPF, POC: NEGATIVE
Mucus, UA: NEGATIVE
Yeast, UA: NEGATIVE

## 2014-08-26 LAB — POCT URINALYSIS DIPSTICK
BILIRUBIN UA: NEGATIVE
Glucose, UA: NEGATIVE
KETONES UA: NEGATIVE
Leukocytes, UA: NEGATIVE
Nitrite, UA: NEGATIVE
PH UA: 5.5
SPEC GRAV UA: 1.025
Urobilinogen, UA: 0.2

## 2014-08-26 MED ORDER — METRONIDAZOLE 500 MG PO TABS: 500.0000 mg | ORAL_TABLET | Freq: Two times a day (BID) | ORAL | Status: DC

## 2014-08-26 MED ORDER — SITAGLIPTIN PHOSPHATE 50 MG PO TABS: 50.0000 mg | ORAL_TABLET | Freq: Every day | ORAL | Status: DC

## 2014-08-26 NOTE — Progress Notes (Addendum)
This chart was scribed for [name], MD by Ladene Artist, ED Scribe. The patient was seen in room 11. Patient's care was started at 11:33 AM.  Patient ID: Brandi Gomez, female   DOB: 1971/04/03, 43 y.o.   MRN: [id number]   Patient ID: Brandi Gomez MRN: [id number], DOB: 06-Feb-1971, 43 y.o. Date of Encounter: 08/26/2014, 11:33 AM  Primary Physician: [name], MD  Chief Complaint  Patient presents with  . Urinary Frequency    with odor   HPI: 43 y.o. year old female with history below presents with urinary frequency over the past few days. Pt reports associated malodor and chills. She denies dysuria, hematuria, flank pain, nausea. Pt admits to drinking large amounts of Coca Cola and lemon flavored tea.   Pt also reports back pain with walking. She states that she applies pressure to relieve pain.   Pt states that she can not see herself losing weight right now. She is considering having a weight loss procedure which she suspects will change her mentality about food. Pt dances an hour per night.   Past Medical History  Diagnosis Date  . Type II or unspecified type diabetes mellitus without mention of complication, not stated as uncontrolled   . Hypertension   . Sleep apnea   . Anxiety   . Depression   . Bipolar 1 disorder   . Seizures     intractable  . Obesity   . Mild mental retardation   . Partial complex seizure disorder with intractable epilepsy 05/12/2014  . Anemia      Home Meds: Prior to Admission medications   Medication Sig Start Date End Date Taking? Authorizing Provider  clindamycin (CLEOCIN-T) 1 % external solution Apply topically 2 (two) times daily. 07/03/14   [name], MD  DILANTIN 100 MG ER capsule TAKE 2 CAPSULES IN THE MORNING AND 3 CAPSULES IN THE EVENING 05/19/14   [name], MD  DILANTIN 100 MG ER capsule TAKE 2 CAPSULES IN THE MORNING AND 3 CAPSULES IN THE EVENING 08/07/14   [name], MD  DILANTIN INFATABS 50 MG  tablet CHEW 2 TABLETS BY MOUTH EVERY MORNING FOR SEIZURE CONTROL 06/13/14   [name], MD  FLUoxetine (PROZAC) 20 MG tablet Take 1 tablet (20 mg total) by mouth daily. 05/22/14   [name], MD  ibuprofen (ADVIL,MOTRIN) 200 MG tablet Take 200 mg by mouth every 6 (six) hours as needed.    Historical Provider, MD  metroNIDAZOLE (FLAGYL) 500 MG tablet Take 1 tablet (500 mg total) by mouth 3 (three) times daily. 07/03/14   [name], MD  sitaGLIPtin (JANUVIA) 50 MG tablet Take 1 tablet (50 mg total) by mouth daily. For control of blood sugar 02/24/13   [name], MD  topiramate (TOPAMAX) 50 MG tablet Take 1 tablet (50 mg total) by mouth 2 (two) times daily. 05/22/14   [name], MD  torsemide (DEMADEX) 20 MG tablet Take 1 tablet (20 mg total) by mouth daily.    [name], MD    Allergies:  Allergies  Allergen Reactions  . Amoxicillin Itching  . Hydrocodone Other (See Comments)    Depressed     History   Social History  . Marital Status: Single    Spouse Name: N/A    Number of Children: N/A  . Years of Education: N/A   Occupational History  . disabled    Social History Main Topics  . Smoking status: Never Smoker   . Smokeless tobacco:  Never Used  . Alcohol Use: No  . Drug Use: No  . Sexual Activity: Not on file   Other Topics Concern  . Not on file   Social History Narrative  . No narrative on file     Review of Systems: Constitutional: negative for fever, night sweats, weight changes, or fatigue, +chills HEENT: negative for vision changes, hearing loss, congestion, rhinorrhea, ST, epistaxis, or sinus pressure Cardiovascular: negative for chest pain or palpitations Respiratory: negative for hemoptysis, wheezing, shortness of breath, or cough Abdominal: negative for abdominal pain, nausea, vomiting, diarrhea, or constipation GU: negative for dysuria, hematuria, or flank pain, +frequency Dermatological: negative for rash Neurologic: negative  for headache, dizziness, or syncope All other systems reviewed and are otherwise negative with the exception to those above and in the HPI.  Physical Exam: Triage Vitals: Blood pressure 120/80, pulse 81, temperature 97.9 F (36.6 C), temperature source Oral, resp. rate 16, height 5' 2.5" (1.588 m), weight 293 lb (132.904 kg), last menstrual period 08/15/2014, SpO2 99.00%., Body mass index is 52.7 kg/(m^2). General: Well developed, well nourished, in no acute distress. Head: Normocephalic, atraumatic, eyes without discharge, sclera non-icteric, nares are without discharge. Bilateral auditory canals clear, TM's are without perforation, pearly grey and translucent with reflective cone of light bilaterally. Oral cavity moist, posterior pharynx without exudate, erythema, peritonsillar abscess, or post nasal drip.  Neck: Supple. No thyromegaly. Full ROM. No lymphadenopathy. Lungs: Clear bilaterally to auscultation without wheezes, rales, or rhonchi. Breathing is unlabored. Heart: RRR with S1 S2. No murmurs, rubs, or gallops appreciated. Abdomen: Soft, non-tender, non-distended with normoactive bowel sounds. No hepatomegaly. No rebound/guarding. No obvious abdominal masses. Msk:  Strength and tone normal for age. Extremities/Skin: Warm and dry. No clubbing or cyanosis. No edema. No rashes or suspicious lesions. Neuro: Alert and oriented X 3. Moves all extremities spontaneously. Gait is normal. CNII-XII grossly in tact. Psych:  Responds to questions appropriately with a normal affect.   Wt Readings from Last 3 Encounters:  08/26/14 293 lb (132.904 kg)  07/03/14 296 lb 6.4 oz (134.446 kg)  05/22/14 297 lb 12.8 oz (135.081 kg)   Labs: Results for orders placed in visit on 08/26/14  POCT UA - MICROSCOPIC ONLY      Result Value Ref Range   WBC, Ur, HPF, POC 0-1     RBC, urine, microscopic 0-1     Bacteria, U Microscopic neg     Mucus, UA neg     Epithelial cells, urine per micros 0-2     Crystals,  Ur, HPF, POC neg     Casts, Ur, LPF, POC neg     Yeast, UA neg    POCT URINALYSIS DIPSTICK      Result Value Ref Range   Color, UA yellow     Clarity, UA clear     Glucose, UA neg     Bilirubin, UA neg     Ketones, UA neg     Spec Grav, UA 1.025     Blood, UA trace-lysed     pH, UA 5.5     Protein, UA trace     Urobilinogen, UA 0.2     Nitrite, UA neg     Leukocytes, UA Negative       ASSESSMENT AND PLAN:  42 y.o. year old female with  1. Urinary frequency    Results for orders placed in visit on 08/26/14  POCT UA - MICROSCOPIC ONLY      Result Value Ref Range  WBC, Ur, HPF, POC 0-1     RBC, urine, microscopic 0-1     Bacteria, U Microscopic neg     Mucus, UA neg     Epithelial cells, urine per micros 0-2     Crystals, Ur, HPF, POC neg     Casts, Ur, LPF, POC neg     Yeast, UA neg    POCT URINALYSIS DIPSTICK      Result Value Ref Range   Color, UA yellow     Clarity, UA clear     Glucose, UA neg     Bilirubin, UA neg     Ketones, UA neg     Spec Grav, UA 1.025     Blood, UA trace-lysed     pH, UA 5.5     Protein, UA trace     Urobilinogen, UA 0.2     Nitrite, UA neg     Leukocytes, UA Negative      I personally performed the services described in this documentation, which was scribed in my presence. The recorded information has been reviewed and is accurate. Urinary frequency - Plan: POCT UA - Microscopic Only, POCT urinalysis dipstick  Increased frequency of urination - Plan: metroNIDAZOLE (FLAGYL) 500 MG tablet  Urinary body odor - Plan: metroNIDAZOLE (FLAGYL) 500 MG tablet  Diabetes mellitus type 2 in obese - Plan: sitaGLIPtin (JANUVIA) 50 MG tablet  Need for prophylactic vaccination and inoculation against influenza - Plan: Flu Vaccine QUAD 36+ mos IM   Signed, [name], MD 08/26/2014 11:33 AM

## 2014-09-13 ENCOUNTER — Ambulatory Visit (INDEPENDENT_AMBULATORY_CARE_PROVIDER_SITE_OTHER): Payer: Medicare Other | Admitting: Adult Health

## 2014-09-13 ENCOUNTER — Encounter: Payer: Self-pay | Admitting: Adult Health

## 2014-09-13 VITALS — BP 136/81 | HR 75 | Ht 60.0 in | Wt 293.0 lb

## 2014-09-13 DIAGNOSIS — R569 Unspecified convulsions: Secondary | ICD-10-CM

## 2014-09-13 DIAGNOSIS — Z5181 Encounter for therapeutic drug level monitoring: Secondary | ICD-10-CM | POA: Diagnosis not present

## 2014-09-13 DIAGNOSIS — G4733 Obstructive sleep apnea (adult) (pediatric): Secondary | ICD-10-CM | POA: Diagnosis not present

## 2014-09-13 DIAGNOSIS — Z9989 Dependence on other enabling machines and devices: Secondary | ICD-10-CM

## 2014-09-13 NOTE — Progress Notes (Signed)
I have read the note, and I agree with the clinical assessment and plan.  Sadonna Kotara KEITH   

## 2014-09-13 NOTE — Patient Instructions (Signed)
Nonepileptic Seizures °Nonepileptic seizures are seizures that are not caused by abnormal electrical signals in your brain. These seizures often seem like epileptic seizures, but they are not caused by epilepsy.  °There are two types of nonepileptic seizures: °· A physiologic nonepileptic seizure results from a disruption in your brain. °· A psychogenic seizure results from emotional stress. These seizures are sometimes called pseudoseizures. °CAUSES  °Causes of physiologic nonepileptic seizures include:  °· Sudden drop in blood pressure. °· Low blood sugar. °· Low levels of salt (sodium) in your blood. °· Low levels of calcium in your blood. °· Migraine. °· Heart rhythm problems. °· Sleep disorders. °· Drug and alcohol abuse. °Common causes of psychogenic nonepileptic seizures include: °· Stress. °· Emotional trauma. °· Sexual or physical abuse. °· Major life events, such as divorce or the death of a loved one. °· Mental health disorders, including panic attack and hyperactivity disorder. °SIGNS AND SYMPTOMS °A nonepileptic seizure can look like an epileptic seizure, including uncontrollable shaking (convulsions), or changes in attention, behavior, or the ability to remain awake and alert. However, there are some differences. Nonepileptic seizures usually: °· Do not cause physical injuries. °· Start slowly. °· Include crying or shrieking. °· Last longer than 2 minutes. °· Have a short recovery time without headache or exhaustion. °DIAGNOSIS  °Your health care provider can usually diagnose nonepileptic seizures after taking your medical history and giving you a physical exam. Your health care provider may want to talk to your friends or relatives who have seen you have a seizure.  °You may also need to have tests to look for causes of physiologic nonepileptic seizures. This may include an electroencephalogram (EEG), which is a test that measures electrical activity in your brain. If you have had an epileptic  seizure, the results of your EEG will be abnormal. If your health care provider thinks you have had a psychogenic nonepileptic seizure, you may need to see a mental health specialist for an evaluation. °TREATMENT  °Treatment depends on the type and cause of your seizures. °· For physiologic nonepileptic seizures, treatment is aimed at addressing the underlying condition that caused the seizures. These seizures usually stop when the underlying condition is properly treated. °· Nonepileptic seizures do not respond to the seizure medicines used to treat epilepsy. °· For psychogenic seizures, you may need to work with a mental health specialist. °HOME CARE INSTRUCTIONS °Home care will depend on the type of nonepileptic seizures you have.  °· Follow all your health care provider's instructions. °· Keep all your follow-up appointments. °SEEK MEDICAL CARE IF: °You continue to have seizures after treatment. °SEEK IMMEDIATE MEDICAL CARE IF: °· Your seizures change or become more frequent. °· You injure yourself during a seizure. °· You have one seizure after another. °· You have trouble recovering from a seizure. °· You have chest pain or trouble breathing. °MAKE SURE YOU: °· Understand these instructions. °· Will watch your condition. °· Will get help right away if you are not doing well or get worse. °Document Released: 01/10/2006 Document Revised: 04/11/2014 Document Reviewed: 09/21/2013 °[employer]® Patient Information ©2015 [employer]. This information is not intended to replace advice given to you by your health care provider. Make sure you discuss any questions you have with your health care provider. ° °

## 2014-09-13 NOTE — Progress Notes (Signed)
PATIENT: Brandi Gomez DOB: [birth year]  REASON FOR VISIT: follow up HISTORY FROM: patient  HISTORY OF PRESENT ILLNESS: Brandi Gomez is a 43 year old female with a history of intractable partial complex seizures. She returns today for follow-up. She is currently taking Dilantin and Topamax. She is tolerating both medications well. She reports that she has not had any seizures since the last visit. She does not operate a motor vehicle. She does have OSA on CPAP, she has not followed up with Dr. Rexene Alberts for a Sleep Download. She did not bring her machine today. She states that she uses the CPAP but often takes it off because she gets "mucus in her nose." patient states that her memory has gotten worse. She states that someone can ask her a question and her brain "freezes." She does not think her memory has gotten worse since starting Topamax.   HISTORY 05/12/14 (CW): 43 year old right-handed black female with a history of intractable partial complex type seizures. She indicates that she does not recall the seizure itself. She has had at least 2 seizures within the last month or so, and she will have episodes where she has lipsmacking, and then she will start singing. The event will usually last about 4 or 5 minutes, with resolution at that time. The patient remains on Dilantin taking 200 mg in the morning and 300 mg in the evening with a 50 mg tablet in the evening. She indicates that the 50 mg tablets upset her stomach. She is continuing to gain weight, and she remains on CPAP. She continues to have memory problems and excessive daytime drowsiness. She has been seen by Dr. Rexene Alberts for her sleep issues in the past. She returns to this office for an evaluation.  REVIEW OF SYSTEMS: Full 14 system review of systems performed and notable only for:  Constitutional: Chills Eyes: Eye discharge, light sensitivity, blurred vision Ear/Nose/Throat: Hearing loss Skin: N/A  Cardiovascular: N/A  Respiratory: N/A    Gastrointestinal: N/A  Genitourinary: N/A Hematology/Lymphatic: N/A  Endocrine: N/A Musculoskeletal:N/A  Allergy/Immunology: N/A  Neurological: Memory loss, headache, numbness  Psychiatric: Agitation Sleep: Apnea   ALLERGIES: Allergies  Allergen Reactions  . Amoxicillin Itching  . Hydrocodone Other (See Comments)    Depressed     HOME MEDICATIONS: Outpatient Prescriptions Prior to Visit  Medication Sig Dispense Refill  . clindamycin (CLEOCIN-T) 1 % external solution Apply topically 2 (two) times daily.  30 mL  0  . DILANTIN 100 MG ER capsule TAKE 2 CAPSULES IN THE MORNING AND 3 CAPSULES IN THE EVENING  150 capsule  2  . DILANTIN 100 MG ER capsule TAKE 2 CAPSULES IN THE MORNING AND 3 CAPSULES IN THE EVENING  120 capsule  3  . DILANTIN INFATABS 50 MG tablet CHEW 2 TABLETS BY MOUTH EVERY MORNING FOR SEIZURE CONTROL  60 tablet  3  . FLUoxetine (PROZAC) 20 MG tablet Take 1 tablet (20 mg total) by mouth daily.  30 tablet  3  . ibuprofen (ADVIL,MOTRIN) 200 MG tablet Take 200 mg by mouth every 6 (six) hours as needed.      . metroNIDAZOLE (FLAGYL) 500 MG tablet Take 1 tablet (500 mg total) by mouth 2 (two) times daily.  20 tablet  5  . sitaGLIPtin (JANUVIA) 50 MG tablet Take 1 tablet (50 mg total) by mouth daily. For control of blood sugar  30 tablet  5  . topiramate (TOPAMAX) 50 MG tablet Take 1 tablet (50 mg total) by mouth  2 (two) times daily.  30 tablet  3  . torsemide (DEMADEX) 20 MG tablet Take 1 tablet (20 mg total) by mouth daily.  30 tablet  5   No facility-administered medications prior to visit.    PAST MEDICAL HISTORY: Past Medical History  Diagnosis Date  . Type II or unspecified type diabetes mellitus without mention of complication, not stated as uncontrolled   . Hypertension   . Sleep apnea   . Anxiety   . Depression   . Bipolar 1 disorder   . Seizures     intractable  . Obesity   . Mild mental retardation   . Partial complex seizure disorder with  intractable epilepsy 05/12/2014  . Anemia     PAST SURGICAL HISTORY: Past Surgical History  Procedure Laterality Date  . Nasal sinus surgery    . Myringotomy with tube placement    . Colonoscopy      2012-normal , Dr Sharlett Iles  . Esophagogastroduodenoscopy      normal-Dr Sharlett Iles 2012    FAMILY HISTORY: Family History  Problem Relation Age of Onset  . Diabetes Mother     passed away from accidental death  . Mental illness Father   . Diabetes Daughter   . Cancer Daughter     leukemia  . Cancer Maternal Aunt     ovarian ca    SOCIAL HISTORY: History   Social History  . Marital Status: Single    Spouse Name: N/A    Number of Children: N/A  . Years of Education: N/A   Occupational History  . disabled    Social History Main Topics  . Smoking status: Never Smoker   . Smokeless tobacco: Never Used  . Alcohol Use: No  . Drug Use: No  . Sexual Activity: Not on file   Other Topics Concern  . Not on file   Social History Narrative  . No narrative on file      PHYSICAL EXAM  Filed Vitals:   09/13/14 0906  BP: 136/81  Pulse: 75  Height: 5' (1.524 m)  Weight: 293 lb (132.904 kg)   Body mass index is 57.22 kg/(m^2).  Generalized: Well developed, in no acute distress   Neurological examination  Mentation: Alert oriented to time, place, history taking. Follows all commands speech and language fluent Cranial nerve II-XII: Pupils were equal round reactive to light. Extraocular movements were full, visual field were full on confrontational test. Facial sensation and strength were normal.  Uvula tongue midline. Head turning and shoulder shrug  were normal and symmetric. Motor: The motor testing reveals 5 over 5 strength of all 4 extremities. Good symmetric motor tone is noted throughout.  Sensory: Sensory testing is intact to soft touch on all 4 extremities. No evidence of extinction is noted.  Coordination: Cerebellar testing reveals good finger-nose-finger and  heel-to-shin bilaterally.  Gait and station: Gait is normal. Tandem gait is unsteady. Romberg is positive. No drift is seen.  Reflexes: Deep tendon reflexes are symmetric and normal bilaterally.    DIAGNOSTIC DATA (LABS, IMAGING, TESTING) - I reviewed patient records, labs, notes, testing and imaging myself where available.  Lab Results  Component Value Date   WBC 5.9 05/13/2014   HGB 11.8 05/13/2014   HCT 35.1 05/13/2014   MCV 90.9 05/13/2014   PLT 259 05/13/2014      Component Value Date/Time   NA 139 05/12/2014 1451   NA 137 02/20/2013 1411   K 4.0 05/12/2014 1451   CL 102  05/12/2014 1451   CO2 29 05/12/2014 1451   GLUCOSE 143* 05/12/2014 1451   GLUCOSE 101* 02/20/2013 1411   BUN 14 05/12/2014 1451   BUN 13 02/20/2013 1411   CREATININE 0.75 05/12/2014 1451   CREATININE 0.84 02/20/2013 1411   CALCIUM 9.0 05/12/2014 1451   PROT 7.2 05/12/2014 1451   PROT 7.1 02/20/2013 1411   ALBUMIN 3.7 02/20/2013 1411   AST 18 05/12/2014 1451   ALT 21 05/12/2014 1451   ALKPHOS 185* 05/12/2014 1451   BILITOT 0.2 05/12/2014 1451   GFRNONAA 97 05/12/2014 1451   GFRAA 111 05/12/2014 1451    Lab Results  Component Value Date   HGBA1C 6.1 01/01/2014   Lab Results  Component Value Date   EEFEOFHQ19 758 05/13/2014   Lab Results  Component Value Date   TSH 2.215 04/07/2014      ASSESSMENT AND PLAN 43 y.o. year old female  has a past medical history of Type II or unspecified type diabetes mellitus without mention of complication, not stated as uncontrolled; Hypertension; Sleep apnea; Anxiety; Depression; Bipolar 1 disorder; Seizures; Obesity; Mild mental retardation; Partial complex seizure disorder with intractable epilepsy (05/12/2014); and Anemia. here with:  1. Seizures 2. OSA on CPAP 3. Mild memory loss  Patient's seizures have been controlled with dilantin and topamax. She has not had any additional seizures since the last visit. On exam tandem gait is unsteady and romberg positive. I will check dilantin level today. The  patient does on OSA on CPAP but has not followed up with Dr. Rexene Alberts since diagnoses. I have advised the patient to scheduled an appointment for follow-up with Dr. Rexene Alberts. Her ongoing memory issues could be due to Sleep Apnea not adequately treated. Patient will follow-up in 3 months or sooner if needed.   Ward Givens, MSN, NP-C 09/13/2014, 8:50 AM [facility] [address], [place], [place] [address] [phone]  Note: This document was prepared with digital dictation and possible smart phrase technology. Any transcriptional errors that result from this process are unintentional.

## 2014-09-14 ENCOUNTER — Telehealth: Payer: Self-pay | Admitting: Adult Health

## 2014-09-14 LAB — PHENYTOIN LEVEL, TOTAL: PHENYTOIN LVL: 24.2 ug/mL — AB (ref 10.0–20.0)

## 2014-09-14 MED ORDER — DILANTIN INFATABS 50 MG PO CHEW: CHEWABLE_TABLET | ORAL | Status: DC

## 2014-09-14 NOTE — Telephone Encounter (Signed)
Patient is returning a call regarding her lab results.

## 2014-09-14 NOTE — Telephone Encounter (Signed)
I called the patient in regards to dilantin level. It is elevated. She will decrease her dose and take 1 of the 50 mg chewable dilantin tablets instead of 2. I will have her back in 2 weeks to recheck her dilantin level. Patient verbalized understanding.

## 2014-09-14 NOTE — Telephone Encounter (Signed)
I called the patient in regards to her lab work. Left a message for her to call our office.

## 2014-09-28 ENCOUNTER — Telehealth: Payer: Self-pay | Admitting: Adult Health

## 2014-09-28 NOTE — Telephone Encounter (Signed)
I called the patient to come have her dilantin level rechecked.

## 2014-10-08 ENCOUNTER — Other Ambulatory Visit: Payer: Self-pay | Admitting: Neurology

## 2014-10-10 DIAGNOSIS — Z23 Encounter for immunization: Secondary | ICD-10-CM | POA: Diagnosis not present

## 2014-10-11 ENCOUNTER — Other Ambulatory Visit (INDEPENDENT_AMBULATORY_CARE_PROVIDER_SITE_OTHER): Payer: Self-pay

## 2014-10-11 ENCOUNTER — Other Ambulatory Visit: Payer: Self-pay | Admitting: Adult Health

## 2014-10-11 DIAGNOSIS — Z5181 Encounter for therapeutic drug level monitoring: Secondary | ICD-10-CM

## 2014-10-11 DIAGNOSIS — Z0289 Encounter for other administrative examinations: Secondary | ICD-10-CM

## 2014-10-12 ENCOUNTER — Telehealth: Payer: Self-pay

## 2014-10-12 NOTE — Telephone Encounter (Signed)
Received a fax from [facility] that pt received a flu shot on 10/10/14. We gave pt a flu shot on 08/26/14. Called pt to let her know that she did receive 2 flu shots and that it would not harm her, but that she may receive a bill from either [place] or [facility] because her ins will prob only pay for one.

## 2014-11-10 ENCOUNTER — Other Ambulatory Visit: Payer: Self-pay | Admitting: Neurology

## 2014-11-30 ENCOUNTER — Telehealth: Payer: Self-pay | Admitting: Neurology

## 2014-11-30 NOTE — Telephone Encounter (Signed)
Called patient to ask her to take machine to [facility] for recent download for upcoming visit, she verbalized understanding said ok.

## 2014-12-07 ENCOUNTER — Other Ambulatory Visit: Payer: Self-pay | Admitting: Neurology

## 2014-12-07 ENCOUNTER — Other Ambulatory Visit: Payer: Self-pay | Admitting: Family Medicine

## 2014-12-07 ENCOUNTER — Telehealth: Payer: Self-pay | Admitting: Neurology

## 2014-12-07 NOTE — Telephone Encounter (Signed)
Both Rx's were already sent to the pharmacy.  I called the pharmacy.  Spoke with Lennette Bihari.  He said they do have the Rx's on file, they just do not have the medication in stock, so they have ordered it.  They will contact patient when meds arrive and are ready for pick up.  I called the patient back.  Got no answer.  Left message.

## 2014-12-07 NOTE — Telephone Encounter (Signed)
Pt is calling to make sure that her DILANTIN INFATABS 50 MG tablet and 100mg  will be refilled before the holiday cause she will be out.  She said [facility] contact [facility] on [address].  Please advise.

## 2014-12-12 ENCOUNTER — Ambulatory Visit (INDEPENDENT_AMBULATORY_CARE_PROVIDER_SITE_OTHER): Payer: Commercial Managed Care - HMO | Admitting: Family Medicine

## 2014-12-12 VITALS — BP 136/82 | HR 92 | Temp 98.0°F | Resp 16 | Ht 61.5 in | Wt 297.4 lb

## 2014-12-12 DIAGNOSIS — F329 Major depressive disorder, single episode, unspecified: Secondary | ICD-10-CM

## 2014-12-12 DIAGNOSIS — F32A Depression, unspecified: Secondary | ICD-10-CM

## 2014-12-12 DIAGNOSIS — E119 Type 2 diabetes mellitus without complications: Secondary | ICD-10-CM | POA: Diagnosis not present

## 2014-12-12 LAB — POCT GLYCOSYLATED HEMOGLOBIN (HGB A1C): Hemoglobin A1C: 6.9

## 2014-12-12 MED ORDER — FLUOXETINE HCL 20 MG PO TABS: 20.0000 mg | ORAL_TABLET | Freq: Every day | ORAL | Status: DC

## 2014-12-12 NOTE — Progress Notes (Signed)
Subjective:  This chart was scribed for [name], MD by Mercy Moore, Medial Scribe. This patient was seen in room 4 and the patient's care was started at 7:01 PM.    Patient ID: Brandi Gomez, female    DOB: [birth year], 44 y.o.   MRN: [id number]  Chief Complaint  Patient presents with   Medication Refill    HPI HPI Comments: Brandi Gomez is a 44 y.o. female who presents to the Emergency Department to have her mediations refilled for her depression medication. Patient reports left foot pain located at her arch. Patient reports pain with ambulation and bearing weight, but states that her pain has since resolved.   Patient reports that they are no longer living in a hotel; they now live on [address]. Patient reports turmoil between her and her daughter who just recently married.   Patient Active Problem List   Diagnosis Date Noted   OSA (obstructive sleep apnea) 03/09/2013   Encounter for therapeutic drug monitoring 10/16/2012   Generalized anxiety disorder 06/23/2012   Unspecified essential hypertension 07/27/2008   HEADACHE 12/11/2007   ANKLE EDEMA 05/08/2007   OBESITY, NOS 02/05/2007   DEPRESSION, MAJOR, RECURRENT 02/05/2007   Convulsions 02/05/2007   APNEA, SLEEP 02/05/2007   Past Medical History  Diagnosis Date   Type II or unspecified type diabetes mellitus without mention of complication, not stated as uncontrolled    Hypertension    Sleep apnea    Anxiety    Depression    Bipolar 1 disorder    Seizures     intractable   Obesity    Mild mental retardation    Partial complex seizure disorder with intractable epilepsy 05/12/2014   Anemia    Past Surgical History  Procedure Laterality Date   Nasal sinus surgery     Myringotomy with tube placement     Colonoscopy      2012-normal , Dr Sharlett Iles   Esophagogastroduodenoscopy      normal-Dr Sharlett Iles 2012   Allergies  Allergen Reactions   Amoxicillin Itching    Hydrocodone Other (See Comments)    Depressed    Prior to Admission medications   Medication Sig Start Date End Date Taking? Authorizing Provider  clindamycin (CLEOCIN-T) 1 % external solution Apply topically 2 (two) times daily. 07/03/14   [name], MD  DILANTIN 100 MG ER capsule TAKE 2 CAPSULES IN THE MORNING AND 3 CAPSULES IN THE EVENING 08/07/14   [name], MD  DILANTIN 100 MG ER capsule TAKE 2 CAPSULES IN THE MORNING AND 3 CAPSULES IN THE EVENING 11/11/14   [name], MD  DILANTIN INFATABS 50 MG tablet CHEW 1 TABLETS BY MOUTH EVERY MORNING FOR SEIZURE CONTROL 09/14/14   Ward Givens, NP  FLUoxetine (PROZAC) 20 MG tablet Take 1 tablet (20 mg total) by mouth daily. 05/22/14   [name], MD  ibuprofen (ADVIL,MOTRIN) 200 MG tablet Take 200 mg by mouth every 6 (six) hours as needed.    Historical Provider, MD  metroNIDAZOLE (FLAGYL) 500 MG tablet Take 1 tablet (500 mg total) by mouth 2 (two) times daily. 08/26/14   [name], MD  phenytoin (DILANTIN INFATABS) 50 MG tablet Chew 1 tablet (50 mg total) by mouth daily. 10/08/14   Ward Givens, NP  phenytoin (DILANTIN INFATABS) 50 MG tablet Chew 1 tablet (50 mg total) by mouth every morning. For seizure control 12/07/14   Ward Givens, NP  sitaGLIPtin (JANUVIA) 50 MG tablet Take 1 tablet (50 mg  total) by mouth daily. For control of blood sugar 08/26/14   [name], MD  topiramate (TOPAMAX) 50 MG tablet Take 1 tablet (50 mg total) by mouth 2 (two) times daily. 05/22/14   [name], MD  torsemide (DEMADEX) 20 MG tablet Take 1 tablet (20 mg total) by mouth daily.    [name], MD   History   Social History   Marital Status: Single    Spouse Name: N/A    Number of Children: 3   Years of Education: 81   Occupational History   disabled        Social History Main Topics   Smoking status: Never Smoker    Smokeless tobacco: Never Used   Alcohol Use: No   Drug Use: No   Sexual Activity:  Not on file   Other Topics Concern   Not on file   Social History Narrative   Patient lives at home with daughter.    Patient has 3 children.    Patient is right handed.    Patient has a high school education.    Patient is on disability     Review of Systems     Objective:   Physical Exam  Constitutional: She is oriented to person, place, and time. She appears well-developed and well-nourished. No distress.  Patient is obese and appears depressed.   HENT:  Head: Normocephalic and atraumatic.  Eyes: EOM are normal.  Neck: Neck supple. No tracheal deviation present.  Cardiovascular: Normal rate.   Pulmonary/Chest: Effort normal. No respiratory distress.  Musculoskeletal: Normal range of motion.  Neurological: She is alert and oriented to person, place, and time.  Skin: Skin is warm and dry.  Psychiatric: She has a normal mood and affect. Her behavior is normal.  Nursing note and vitals reviewed.    Filed Vitals:   12/12/14 1841  BP: 136/82  Pulse: 92  Temp: 98 F (36.7 C)  TempSrc: Oral  Resp: 16  Height: 5' 1.5" (1.562 m)  Weight: 297 lb 6.4 oz (134.9 kg)  SpO2: 100%   Results for orders placed or performed in visit on 12/12/14  POCT glycosylated hemoglobin (Hb A1C)  Result Value Ref Range   Hemoglobin A1C 6.9         Assessment & Plan:    I personally performed the services described in this documentation, which was scribed in my presence. The recorded information has been reviewed and is accurate.  This chart was scribed in my presence and reviewed by me personally.    ICD-9-CM ICD-10-CM   1. Depression 311 F32.9 Comprehensive metabolic panel     FLUoxetine (PROZAC) 20 MG tablet     DISCONTINUED: FLUoxetine (PROZAC) 20 MG tablet  2. Diabetes type 2, controlled 250.00 E11.9 POCT glycosylated hemoglobin (Hb A1C)     Comprehensive metabolic panel     Microalbumin, urine     Signed, [name], MD

## 2014-12-12 NOTE — Patient Instructions (Signed)
Continue to work on your weight loss. I like to see undergoing in 3 months

## 2014-12-13 LAB — MICROALBUMIN, URINE: Microalb, Ur: 5 mg/dL — ABNORMAL HIGH (ref <2.0)

## 2014-12-13 LAB — COMPREHENSIVE METABOLIC PANEL
ALT: 21 U/L (ref 0–35)
AST: 16 U/L (ref 0–37)
Albumin: 3.5 g/dL (ref 3.5–5.2)
Alkaline Phosphatase: 186 U/L — ABNORMAL HIGH (ref 39–117)
BUN: 17 mg/dL (ref 6–23)
CO2: 27 mEq/L (ref 19–32)
Calcium: 8.2 mg/dL — ABNORMAL LOW (ref 8.4–10.5)
Chloride: 102 mEq/L (ref 96–112)
Creat: 0.8 mg/dL (ref 0.50–1.10)
Glucose, Bld: 148 mg/dL — ABNORMAL HIGH (ref 70–99)
Potassium: 3.9 mEq/L (ref 3.5–5.3)
Sodium: 138 mEq/L (ref 135–145)
Total Bilirubin: 0.2 mg/dL (ref 0.2–1.2)
Total Protein: 7 g/dL (ref 6.0–8.3)

## 2014-12-16 ENCOUNTER — Telehealth: Payer: Self-pay | Admitting: Neurology

## 2014-12-16 NOTE — Telephone Encounter (Signed)
Contacted [facility] for recent download, (Brandi Gomez)said that they did not have any thing current. Patient has to bring machine in for visit to get information. Called patient to inform to bring in machine for visit.

## 2014-12-19 ENCOUNTER — Ambulatory Visit (INDEPENDENT_AMBULATORY_CARE_PROVIDER_SITE_OTHER): Payer: Commercial Managed Care - HMO | Admitting: Neurology

## 2014-12-19 ENCOUNTER — Encounter: Payer: Self-pay | Admitting: Neurology

## 2014-12-19 VITALS — BP 153/94 | HR 92 | Temp 99.0°F | Ht 60.0 in | Wt 297.0 lb

## 2014-12-19 DIAGNOSIS — G4733 Obstructive sleep apnea (adult) (pediatric): Secondary | ICD-10-CM

## 2014-12-19 DIAGNOSIS — G40909 Epilepsy, unspecified, not intractable, without status epilepticus: Secondary | ICD-10-CM

## 2014-12-19 DIAGNOSIS — Z9989 Dependence on other enabling machines and devices: Principal | ICD-10-CM

## 2014-12-19 NOTE — Progress Notes (Signed)
Subjective:    Patient ID: Brandi Gomez is a 44 y.o. female.  HPI     Interim history:   Brandi Gomez is a 44 year old right-handed woman with an underlying medical history of hypertension, diabetes, depression, morbid obesity, seizures, anxiety who presents for follow-up consultation of her severe obstructive sleep apnea. The patient is accompanied by her friend today. I first met her on 08/13/2013 at the request of Dr. Jannifer Franklin, at which time we talked about her recent sleep study results. Brandi Gomez was previously diagnosed with obstructive sleep apnea but not compliant with CPAP treatment. Brandi Gomez reported trying to adjust CPAP therapy.   Today, I reviewed her compliance data from 09/21/2014 through 12/18/2014 which is a total of 89 days during which time Brandi Gomez used her machine 43 days with percent used days greater than 4 hours at only 7%, indicating poor compliance, residual AHI at 1.7 per hour, leak acceptable at 19 L/m for the 95th percentile pressure of 10 cm with EPR of 2, average usage for all days of only 1 hour and 19 minutes, average usage for days on machine of 2 hours and 45 minutes.  Today, Brandi Gomez reports having had increase in mucus and Brandi Gomez coughs. Brandi Gomez had a new mask, but has no filter in the machine. Her DME is [employer]. Brandi Gomez uses distilled water in the humidifier. Brandi Gomez does admit to skipping many days. Her boyfriend tries to remind her to use her machine. Brandi Gomez reports no acute illness or issues with her seizures or seizure medication.   Brandi Gomez had a split-night sleep study on 03/31/2013. Sleep efficiency was at baseline reduced at 77.3% with a latency to sleep of 11.5 minutes and wake after sleep onset of 12 minutes. Brandi Gomez had an arousal index of 21.3 arousals. Brandi Gomez had a high normal percentage of stage I sleep, borderline increased percentage of stage II sleep, reduced percentage of deep sleep at 2.5% and markedly increased percentage of REM sleep at 31.9% with a mildly reduced REM latency of 65.5 minutes.  Brandi Gomez had no significant PLMs or cardiac arrhythmias. Brandi Gomez had mild to moderate snoring and slept only in the right lateral position. Brandi Gomez had a total of 34 obstructive apneas and 34 obstructive hypopneas, and her AHI was 52.3 per hour. Her baseline oxygen saturation was noted to be only 86%, her nadir was 66% and REM sleep. Brandi Gomez was then titrated on CPAP on a pressure of 5-10 cm of water utilizing a nasal mask. Her AHI was reduced to 1.4 events per hour at 10 cm of pressure. Supine REM sleep was achieved on the final pressure. Based on the test results I prescribed CPAP for her.   I reviewed her compliance data from 04/27/2013 through 05/26/2013 which is a total of 30 days, during which time Brandi Gomez uses CPAP every day. Her percent used days greater than 4 hours was 97% indicating excellent compliance. Her average usage was 6 hours and 10 minutes and her residual AHI was 1.8, indicating a good treatment pressure of 10 cm of water with an EPR level of 2.    I reviewed the most recent compliance data from her compliance to that Brandi Gomez brought in from dates 05/15/2013 through 08/12/2013 which is a total of 90 days during which time Brandi Gomez used it every day except for 6 days. Her percent used days greater than 4 hours was only 47% and her average usage for all days was 3 hours and 50 minutes only. Her residual AHI was 3.1 indicating again adequate  treatment pressure of 10 cm with EPR of 2.    Her Past Medical History Is Significant For: Past Medical History  Diagnosis Date  . Type II or unspecified type diabetes mellitus without mention of complication, not stated as uncontrolled   . Hypertension   . Sleep apnea   . Anxiety   . Depression   . Bipolar 1 disorder   . Seizures     intractable  . Obesity   . Mild mental retardation   . Partial complex seizure disorder with intractable epilepsy 05/12/2014  . Anemia     Her Past Surgical History Is Significant For: Past Surgical History  Procedure Laterality Date  .  Nasal sinus surgery    . Myringotomy with tube placement    . Colonoscopy      2012-normal , Dr Sharlett Iles  . Esophagogastroduodenoscopy      normal-Dr Patterson 2012    Her Family History Is Significant For: Family History  Problem Relation Age of Onset  . Diabetes Mother     passed away from accidental death  . Mental illness Father   . Diabetes Daughter   . Cancer Daughter     leukemia  . Cancer Maternal Aunt     ovarian ca    Her Social History Is Significant For: History   Social History  . Marital Status: Single    Spouse Name: N/A    Number of Children: 3  . Years of Education: 12   Occupational History  . disabled   .     Social History Main Topics  . Smoking status: Never Smoker   . Smokeless tobacco: Never Used  . Alcohol Use: No  . Drug Use: No  . Sexual Activity: None   Other Topics Concern  . None   Social History Narrative   Patient lives at home with daughter.    Patient has 3 children.    Patient is right handed.    Patient has a high school education.    Patient is on disability    Her Allergies Are:  Allergies  Allergen Reactions  . Amoxicillin Itching  . Hydrocodone Other (See Comments)    Depressed   :   Her Current Medications Are:  Outpatient Encounter Prescriptions as of 12/19/2014  Medication Sig  . clindamycin (CLEOCIN-T) 1 % external solution Apply topically 2 (two) times daily.  Marland Kitchen DILANTIN 100 MG ER capsule TAKE 2 CAPSULES IN THE MORNING AND 3 CAPSULES IN THE EVENING  . DILANTIN 100 MG ER capsule TAKE 2 CAPSULES IN THE MORNING AND 3 CAPSULES IN THE EVENING  . DILANTIN INFATABS 50 MG tablet CHEW 1 TABLETS BY MOUTH EVERY MORNING FOR SEIZURE CONTROL  . FLUoxetine (PROZAC) 20 MG tablet Take 1 tablet (20 mg total) by mouth daily.  Marland Kitchen ibuprofen (ADVIL,MOTRIN) 200 MG tablet Take 200 mg by mouth every 6 (six) hours as needed.  . sitaGLIPtin (JANUVIA) 50 MG tablet Take 1 tablet (50 mg total) by mouth daily. For control of blood sugar   . torsemide (DEMADEX) 20 MG tablet Take 1 tablet (20 mg total) by mouth daily.  . [DISCONTINUED] phenytoin (DILANTIN INFATABS) 50 MG tablet Chew 1 tablet (50 mg total) by mouth daily. (Patient not taking: Reported on 12/19/2014)  . [DISCONTINUED] phenytoin (DILANTIN INFATABS) 50 MG tablet Chew 1 tablet (50 mg total) by mouth every morning. For seizure control (Patient not taking: Reported on 12/19/2014)  . [DISCONTINUED] topiramate (TOPAMAX) 50 MG tablet Take 1 tablet (50 mg  total) by mouth 2 (two) times daily. (Patient not taking: Reported on 12/19/2014)  :  Review of Systems:  Out of a complete 14 point review of systems, all are reviewed and negative with the exception of these symptoms as listed below:   Review of Systems  Allergic/Immunologic:       Wake up with a lot of phelp in throat, possible allegies    Objective:  Neurologic Exam  Physical Exam Physical Examination:   Filed Vitals:   12/19/14 1334  BP: 153/94  Pulse: 92  Temp:    General Examination: The patient is a very pleasant 44 y.o. female in no acute distress. Brandi Gomez appears well-developed and well-nourished and adequately groomed. Brandi Gomez is morbidly obese.  HEENT: Normocephalic, atraumatic, pupils are equal, round and reactive to light and accommodation. Funduscopic exam is normal with sharp disc margins noted. Extraocular tracking is good without limitation to gaze excursion or nystagmus noted. Normal smooth pursuit is noted. Hearing is grossly intact. Face is symmetric with normal facial animation and normal facial sensation. Speech is clear with no dysarthria noted. There is no hypophonia. There is no lip, neck/head, jaw or voice tremor. Neck is supple with full range of passive and active motion. There are no carotid bruits on auscultation. Oropharynx exam reveals: mild mouth dryness, adequate dental hygiene and moderate airway crowding, due to redundant soft palate and larger tongue. Mallampati is class II. Tongue  protrudes centrally and palate elevates symmetrically. Neck is enlarged.    Chest: Clear to auscultation without wheezing, rhonchi or crackles noted.  Heart: S1+S2+0, regular and normal without murmurs, rubs or gallops noted.   Abdomen: Soft, non-tender and non-distended with normal bowel sounds appreciated on auscultation.  Extremities: There is trace pitting edema in the distal lower extremities bilaterally in her ankles. Pedal pulses are intact.  Skin: Warm and dry without trophic changes noted. There are no varicose veins.  Musculoskeletal: exam reveals no obvious joint deformities, tenderness or joint swelling or erythema.   Neurologically:  Mental status: The patient is awake, alert and oriented in all 4 spheres. Her memory, attention, language and knowledge are mildly impaired; there is mild psychomotor slowness. There is no aphasia, agnosia, apraxia or anomia. Speech is clear with normal prosody and enunciation. Thought process is linear. Mood is mildly depressed and affect is blunted.  Cranial nerves are as described above under HEENT exam. In addition, shoulder shrug is normal with equal shoulder height noted. Motor exam: Normal bulk, strength and tone is noted. There is no drift, tremor or rebound. Romberg shows swaying. Reflexes are 2-3+ throughout. Fine motor skills are intact with normal finger taps, normal hand movements, normal rapid alternating patting, normal foot taps and normal foot agility.  Cerebellar testing shows no dysmetria or intention tremor on finger to nose testing. There is no truncal or gait ataxia.  Sensory exam is intact to light touch, pinprick, vibration, temperature sense in the upper and lower extremities.  Gait, station and balance are unremarkable. No veering to one side is noted. No leaning to one side is noted. Posture is age-appropriate and stance is narrow based. No problems turning are noted. Brandi Gomez turns en bloc. Tandem walk is possible with some  difficulty.   Assessment and Plan:   In summary, KINGA CASSAR is a very pleasant 44 year old female with a history of Seizures, and OSA, confirmation of diagnosis of severe obstructive sleep apnea in 2014, for which Brandi Gomez was placed back on CPAP therapy with initially very  good compliance but Brandi Gomez has since then been less adherent to therapy. Brandi Gomez reports feeling uncomfortable at times and having excess mucus. Brandi Gomez also reports opening her mouth for which I did prescribe an as needed chinstrap but it does not sound like Brandi Gomez has one. Brandi Gomez is advised to get a chinstrap from her DME supplier and I renewed her prescription for CPAP supplies. Brandi Gomez is advised about her sleep study results and her severe obstructive sleep apnea diagnosis. Brandi Gomez is advised that while other treatment options exist, this is the best treatment for her. Brandi Gomez is advised about the risks and ramifications of untreated severe obstructive sleep apnea including increased seizure frequency, mood disorder, cognitive decline, risk for stroke, risk for diabetes and heart disease and arrhythmias. I asked her to strive for weight loss. The patient is willing to get back on track with treatment. Brandi Gomez is advised that Brandi Gomez needs to pick up supplies regularly from her DME company. Brandi Gomez needs to use a filter in the back of her machine.    I answered all their questions today and the patient and her fiance were in agreement with the above outlined plan. I would like to see the patient back in 6 months, sooner if the need arises and encouraged them to call with any interim questions, concerns, problems or updates.

## 2014-12-19 NOTE — Patient Instructions (Signed)

## 2014-12-25 ENCOUNTER — Ambulatory Visit (INDEPENDENT_AMBULATORY_CARE_PROVIDER_SITE_OTHER): Payer: Commercial Managed Care - HMO | Admitting: Family Medicine

## 2014-12-25 VITALS — BP 134/78 | HR 87 | Temp 99.2°F | Resp 16 | Ht 61.0 in | Wt 299.0 lb

## 2014-12-25 DIAGNOSIS — M722 Plantar fascial fibromatosis: Secondary | ICD-10-CM | POA: Diagnosis not present

## 2014-12-25 MED ORDER — DICLOFENAC SODIUM 75 MG PO TBEC: 75.0000 mg | DELAYED_RELEASE_TABLET | Freq: Two times a day (BID) | ORAL | Status: DC

## 2014-12-25 NOTE — Progress Notes (Signed)
Subjective:  This chart was scribed for Brandi Haber MD, by Brandi Gomez, at [facility].  This patient was seen in room 4 and the patient's care was started at 10:14 AM.    Patient ID: Brandi Gomez, female    DOB: May 12, 1971, 44 y.o.   MRN: [id number]  HPI  HPI Comments: Brandi Gomez is a 44 y.o. female who presents to [facility] complaining of left foot heel pain.  Notes she is not able to stand on her left foot at all. Patient states that her heel does not hurt to touch. Patient notes this is the second occurrence, last time which was three weeks ago.     Patient Active Problem List   Diagnosis Date Noted   OSA (obstructive sleep apnea) 03/09/2013   Encounter for therapeutic drug monitoring 10/16/2012   Generalized anxiety disorder 06/23/2012   Unspecified essential hypertension 07/27/2008   HEADACHE 12/11/2007   ANKLE EDEMA 05/08/2007   OBESITY, NOS 02/05/2007   DEPRESSION, MAJOR, RECURRENT 02/05/2007   Convulsions 02/05/2007   APNEA, SLEEP 02/05/2007   Past Medical History  Diagnosis Date   Type II or unspecified type diabetes mellitus without mention of complication, not stated as uncontrolled    Hypertension    Sleep apnea    Anxiety    Depression    Bipolar 1 disorder    Seizures     intractable   Obesity    Mild mental retardation    Partial complex seizure disorder with intractable epilepsy 05/12/2014   Anemia    Past Surgical History  Procedure Laterality Date   Nasal sinus surgery     Myringotomy with tube placement     Colonoscopy      2012-normal , Dr Sharlett Iles   Esophagogastroduodenoscopy      normal-Dr Sharlett Iles 2012   Allergies  Allergen Reactions   Amoxicillin Itching   Hydrocodone Other (See Comments)    Depressed    Prior to Admission medications   Medication Sig Start Date End Date Taking? Authorizing Provider  DILANTIN 100 MG ER capsule TAKE 2 CAPSULES  IN THE MORNING AND 3 CAPSULES IN THE EVENING 08/07/14  Yes [name], MD  DILANTIN 100 MG ER capsule TAKE 2 CAPSULES IN THE MORNING AND 3 CAPSULES IN THE EVENING 11/11/14  Yes [name], MD  DILANTIN INFATABS 50 MG tablet CHEW 1 TABLETS BY MOUTH EVERY MORNING FOR SEIZURE CONTROL 09/14/14  Yes Ward Givens, NP  FLUoxetine (PROZAC) 20 MG tablet Take 1 tablet (20 mg total) by mouth daily. 12/12/14  Yes Brandi Haber, MD  ibuprofen (ADVIL,MOTRIN) 200 MG tablet Take 200 mg by mouth every 6 (six) hours as needed.   Yes Historical Provider, MD  sitaGLIPtin (JANUVIA) 50 MG tablet Take 1 tablet (50 mg total) by mouth daily. For control of blood sugar 08/26/14  Yes Brandi Haber, MD  torsemide (DEMADEX) 20 MG tablet Take 1 tablet (20 mg total) by mouth daily.   Yes Brandi Haber, MD   History   Social History   Marital Status: Single    Spouse Name: N/A    Number of Children: 3   Years of Education: 12   Occupational History   disabled        Social History Main Topics   Smoking status: Never Smoker    Smokeless tobacco: Never Used   Alcohol Use: No   Drug Use: No   Sexual Activity: Not on file  Other Topics Concern   Not on file   Social History Narrative   Patient lives at home with daughter.    Patient has 3 children.    Patient is right handed.    Patient has a high school education.    Patient is on disability        Review of Systems     Objective:   Physical Exam         Middle-aged African-American woman in no acute distress, morbid obesity present Lower extremities are warm and dry with 1+ edema. She is tender over the left calcaneus, medial aspect.     Filed Vitals:   12/25/14 1001  BP: 134/78  Pulse: 87  Temp: 99.2 F (37.3 C)  TempSrc: Oral  Resp: 16  Height: 5\' 1"  (1.549 m)  Weight: 299 lb (135.626 kg)  SpO2: 96%        Assessment & Plan:    This chart was scribed in my presence and reviewed by me  personally.    ICD-9-CM ICD-10-CM   1. Plantar fasciitis of left foot 728.71 M72.2 diclofenac (VOLTAREN) 75 MG EC tablet     Signed, Brandi Haber, MD \

## 2014-12-28 ENCOUNTER — Encounter: Payer: Self-pay | Admitting: Family Medicine

## 2014-12-28 ENCOUNTER — Ambulatory Visit (INDEPENDENT_AMBULATORY_CARE_PROVIDER_SITE_OTHER): Payer: Commercial Managed Care - HMO | Admitting: Family Medicine

## 2014-12-28 VITALS — BP 132/82 | HR 85 | Temp 98.8°F | Resp 17 | Ht 62.0 in | Wt 297.0 lb

## 2014-12-28 DIAGNOSIS — R3589 Other polyuria: Secondary | ICD-10-CM

## 2014-12-28 DIAGNOSIS — J209 Acute bronchitis, unspecified: Secondary | ICD-10-CM | POA: Diagnosis not present

## 2014-12-28 DIAGNOSIS — R358 Other polyuria: Secondary | ICD-10-CM

## 2014-12-28 DIAGNOSIS — G4733 Obstructive sleep apnea (adult) (pediatric): Secondary | ICD-10-CM | POA: Diagnosis not present

## 2014-12-28 LAB — POCT URINALYSIS DIPSTICK
Bilirubin, UA: NEGATIVE
Glucose, UA: NEGATIVE
Ketones, UA: NEGATIVE
Leukocytes, UA: NEGATIVE
Nitrite, UA: NEGATIVE
Spec Grav, UA: 1.02
Urobilinogen, UA: 0.2
pH, UA: 5

## 2014-12-28 LAB — POCT UA - MICROSCOPIC ONLY
Casts, Ur, LPF, POC: NEGATIVE
Crystals, Ur, HPF, POC: NEGATIVE
Mucus, UA: NEGATIVE
Yeast, UA: NEGATIVE

## 2014-12-28 MED ORDER — AZITHROMYCIN 250 MG PO TABS: ORAL_TABLET | ORAL | Status: DC

## 2014-12-28 MED ORDER — HYDROCODONE-HOMATROPINE 5-1.5 MG/5ML PO SYRP: 5.0000 mL | ORAL_SOLUTION | Freq: Three times a day (TID) | ORAL | Status: DC | PRN

## 2014-12-28 NOTE — Patient Instructions (Signed)
Upper Respiratory Infection, Adult An upper respiratory infection (URI) is also sometimes known as the common cold. The upper respiratory tract includes the nose, sinuses, throat, trachea, and bronchi. Bronchi are the airways leading to the lungs. Most people improve within 1 week, but symptoms can last up to 2 weeks. A residual cough may last even longer.  CAUSES Many different viruses can infect the tissues lining the upper respiratory tract. The tissues become irritated and inflamed and often become very moist. Mucus production is also common. A cold is contagious. You can easily spread the virus to others by oral contact. This includes kissing, sharing a glass, coughing, or sneezing. Touching your mouth or nose and then touching a surface, which is then touched by another person, can also spread the virus. SYMPTOMS  Symptoms typically develop 1 to 3 days after you come in contact with a cold virus. Symptoms vary from person to person. They may include:  Runny nose.  Sneezing.  Nasal congestion.  Sinus irritation.  Sore throat.  Loss of voice (laryngitis).  Cough.  Fatigue.  Muscle aches.  Loss of appetite.  Headache.  Low-grade fever. DIAGNOSIS  You might diagnose your own cold based on familiar symptoms, since most people get a cold 2 to 3 times a year. Your caregiver can confirm this based on your exam. Most importantly, your caregiver can check that your symptoms are not due to another disease such as strep throat, sinusitis, pneumonia, asthma, or epiglottitis. Blood tests, throat tests, and X-rays are not necessary to diagnose a common cold, but they may sometimes be helpful in excluding other more serious diseases. Your caregiver will decide if any further tests are required. RISKS AND COMPLICATIONS  You may be at risk for a more severe case of the common cold if you smoke cigarettes, have chronic heart disease (such as heart failure) or lung disease (such as asthma), or if  you have a weakened immune system. The very young and very old are also at risk for more serious infections. Bacterial sinusitis, middle ear infections, and bacterial pneumonia can complicate the common cold. The common cold can worsen asthma and chronic obstructive pulmonary disease (COPD). Sometimes, these complications can require emergency medical care and may be life-threatening. PREVENTION  The best way to protect against getting a cold is to practice good hygiene. Avoid oral or hand contact with people with cold symptoms. Wash your hands often if contact occurs. There is no clear evidence that vitamin C, vitamin E, echinacea, or exercise reduces the chance of developing a cold. However, it is always recommended to get plenty of rest and practice good nutrition. TREATMENT  Treatment is directed at relieving symptoms. There is no cure. Antibiotics are not effective, because the infection is caused by a virus, not by bacteria. Treatment may include:  Increased fluid intake. Sports drinks offer valuable electrolytes, sugars, and fluids.  Breathing heated mist or steam (vaporizer or shower).  Eating chicken soup or other clear broths, and maintaining good nutrition.  Getting plenty of rest.  Using gargles or lozenges for comfort.  Controlling fevers with ibuprofen or acetaminophen as directed by your caregiver.  Increasing usage of your inhaler if you have asthma. Zinc gel and zinc lozenges, taken in the first 24 hours of the common cold, can shorten the duration and lessen the severity of symptoms. Pain medicines may help with fever, muscle aches, and throat pain. A variety of non-prescription medicines are available to treat congestion and runny nose. Your caregiver   can make recommendations and may suggest nasal or lung inhalers for other symptoms.  HOME CARE INSTRUCTIONS   Only take over-the-counter or prescription medicines for pain, discomfort, or fever as directed by your  caregiver.  Use a warm mist humidifier or inhale steam from a shower to increase air moisture. This may keep secretions moist and make it easier to breathe.  Drink enough water and fluids to keep your urine clear or pale yellow.  Rest as needed.  Return to work when your temperature has returned to normal or as your caregiver advises. You may need to stay home longer to avoid infecting others. You can also use a face mask and careful hand washing to prevent spread of the virus. SEEK MEDICAL CARE IF:   After the first few days, you feel you are getting worse rather than better.  You need your caregiver's advice about medicines to control symptoms.  You develop chills, worsening shortness of breath, or brown or red sputum. These may be signs of pneumonia.  You develop yellow or brown nasal discharge or pain in the face, especially when you bend forward. These may be signs of sinusitis.  You develop a fever, swollen neck glands, pain with swallowing, or white areas in the back of your throat. These may be signs of strep throat. SEEK IMMEDIATE MEDICAL CARE IF:   You have a fever.  You develop severe or persistent headache, ear pain, sinus pain, or chest pain.  You develop wheezing, a prolonged cough, cough up blood, or have a change in your usual mucus (if you have chronic lung disease).  You develop sore muscles or a stiff neck. Document Released: 05/21/2001 Document Revised: 02/17/2012 Document Reviewed: 03/02/2014 [employer] Patient Information 2015 [employer]. This information is not intended to replace advice given to you by your health care provider. Make sure you discuss any questions you have with your health care provider.  

## 2014-12-28 NOTE — Progress Notes (Signed)
Brandi Gomez MRN: [id number], DOB: 1971/04/16, 44 y.o. Date of Encounter: 12/28/2014, 11:46 AM  Primary Physician: [name], MD  Chief Complaint:  Chief Complaint  Patient presents with  . Cough  . Nasal Congestion  . Ear Pain  . Leg Pain  . Other    urine frequency     HPI: 44 y.o. year old female presents with a  3  day history of nasal congestion, post nasal drip, sore throat, and cough. Mild sinus pressure. Afebrile. No chills. Nasal congestion thick and green/yellow. Cough is productive of green/yellow sputum and not associated with time of day. Ears feel full, leading to sensation of muffled hearing. Has tried OTC cold preps without success. No GI complaints.   No sick contacts, recent antibiotics, or recent travels.   No leg trauma, sedentary periods, h/o cancer, or tobacco use.  Past Medical History  Diagnosis Date  . Type II or unspecified type diabetes mellitus without mention of complication, not stated as uncontrolled   . Hypertension   . Sleep apnea   . Anxiety   . Depression   . Bipolar 1 disorder   . Seizures     intractable  . Obesity   . Mild mental retardation   . Partial complex seizure disorder with intractable epilepsy 05/12/2014  . Anemia      Home Meds: Prior to Admission medications   Medication Sig Start Date End Date Taking? Authorizing Provider  diclofenac (VOLTAREN) 75 MG EC tablet Take 1 tablet (75 mg total) by mouth 2 (two) times daily. 12/25/14  Yes [name], MD  DILANTIN 100 MG ER capsule TAKE 2 CAPSULES IN THE MORNING AND 3 CAPSULES IN THE EVENING 08/07/14  Yes [name], MD  DILANTIN 100 MG ER capsule TAKE 2 CAPSULES IN THE MORNING AND 3 CAPSULES IN THE EVENING 11/11/14  Yes [name], MD  DILANTIN INFATABS 50 MG tablet CHEW 1 TABLETS BY MOUTH EVERY MORNING FOR SEIZURE CONTROL 09/14/14  Yes Ward Givens, NP  FLUoxetine (PROZAC) 20 MG tablet Take 1 tablet (20 mg total) by mouth daily. 12/12/14  Yes [name], MD  ibuprofen (ADVIL,MOTRIN) 200 MG tablet Take 200 mg by mouth every 6 (six) hours as needed.   Yes Historical Provider, MD  sitaGLIPtin (JANUVIA) 50 MG tablet Take 1 tablet (50 mg total) by mouth daily. For control of blood sugar 08/26/14  Yes [name], MD  torsemide (DEMADEX) 20 MG tablet Take 1 tablet (20 mg total) by mouth daily.   Yes [name], MD    Allergies:  Allergies  Allergen Reactions  . Amoxicillin Itching  . Hydrocodone Other (See Comments)    Depressed     History   Social History  . Marital Status: Single    Spouse Name: N/A    Number of Children: 3  . Years of Education: 12   Occupational History  . disabled   .     Social History Main Topics  . Smoking status: Never Smoker   . Smokeless tobacco: Never Used  . Alcohol Use: No  . Drug Use: No  . Sexual Activity: Not on file   Other Topics Concern  . Not on file   Social History Narrative   Patient lives at home with daughter.    Patient has 3 children.    Patient is right handed.    Patient has a high school education.    Patient is on disability     Review of Systems:  Constitutional: negative for chills, fever, night sweats or weight changes Cardiovascular: negative for chest pain or palpitations Respiratory: negative for hemoptysis, wheezing, or shortness of breath Abdominal: negative for abdominal pain, nausea, vomiting or diarrhea Dermatological: negative for rash Neurologic: negative for headache Urology:  frequency   Physical Exam: Blood pressure 132/82, pulse 85, temperature 98.8 F (37.1 C), temperature source Oral, resp. rate 17, height 5\' 2"  (1.575 m), weight 297 lb (134.718 kg), last menstrual period 12/12/2014, SpO2 96 %., Body mass index is 54.31 kg/(m^2). General: Well developed, well nourished, in no acute distress. Head: Normocephalic, atraumatic, eyes without discharge, sclera non-icteric, nares are congested. Bilateral auditory canals clear, TM's  show old perforations bilaterally with scarring and dullness. No sinus TTP. Oral cavity moist, dentition normal. Posterior pharynx with post nasal drip and mild erythema. No peritonsillar abscess or tonsillar exudate. Neck: Supple. No thyromegaly. Full ROM. No lymphadenopathy. Lungs: Coarse breath sounds bilaterally without wheezes, rales, or rhonchi. Breathing is unlabored.  Heart: RRR with S1 S2. No murmurs, rubs, or gallops appreciated. Msk:  Strength and tone normal for age. Extremities: No clubbing or cyanosis. No edema. Neuro: Alert and oriented X 3. Moves all extremities spontaneously. CNII-XII grossly in tact. Psych:  Responds to questions appropriately with a normal affect.   Results for orders placed or performed in visit on 12/28/14  POCT urinalysis dipstick  Result Value Ref Range   Color, UA yellow    Clarity, UA cloudy    Glucose, UA neg    Bilirubin, UA neg    Ketones, UA neg    Spec Grav, UA 1.020    Blood, UA small    pH, UA 5.0    Protein, UA trace    Urobilinogen, UA 0.2    Nitrite, UA neg    Leukocytes, UA Negative   POCT UA - Microscopic Only  Result Value Ref Range   WBC, Ur, HPF, POC 1-3    RBC, urine, microscopic 1-2    Bacteria, U Microscopic 1+    Mucus, UA neg    Epithelial cells, urine per micros 5-9    Crystals, Ur, HPF, POC neg    Casts, Ur, LPF, POC neg    Yeast, UA neg      ASSESSMENT AND PLAN:  44 y.o. year old female with bronchitis. -Acute bronchitis, unspecified organism - Plan: HYDROcodone-homatropine (HYCODAN) 5-1.5 MG/5ML syrup, azithromycin (ZITHROMAX Z-PAK) 250 MG tablet  Polyuria - Plan: POCT urinalysis dipstick, POCT UA - Microscopic Only  This chart was scribed in my presence and reviewed by me personally.    ICD-9-CM ICD-10-CM   1. Acute bronchitis, unspecified organism 466.0 J20.9 HYDROcodone-homatropine (HYCODAN) 5-1.5 MG/5ML syrup     azithromycin (ZITHROMAX Z-PAK) 250 MG tablet  2. Polyuria 788.42 R35.8 POCT urinalysis  dipstick     POCT UA - Microscopic Only   -RTC 3-5 days if no improvement  Signed, [name], MD 12/28/2014 11:46 AM

## 2015-01-02 ENCOUNTER — Telehealth: Payer: Self-pay

## 2015-01-02 MED ORDER — HYDROCOD POLST-CHLORPHEN POLST 10-8 MG/5ML PO LQCR: 5.0000 mL | Freq: Two times a day (BID) | ORAL | Status: DC | PRN

## 2015-01-02 NOTE — Telephone Encounter (Signed)
Rx for Tussionex printed.  Meds ordered this encounter  Medications  . chlorpheniramine-HYDROcodone (TUSSIONEX PENNKINETIC ER) 10-8 MG/5ML LQCR    Sig: Take 5 mLs by mouth every 12 (twelve) hours as needed for cough (cough).    Dispense:  80 mL    Refill:  0    Order Specific Question:  Supervising Provider    Answer:  DOOLITTLE, ROBERT P [[id number]]

## 2015-01-02 NOTE — Telephone Encounter (Signed)
Pt advised. Rx in pick up drawer.

## 2015-01-02 NOTE — Telephone Encounter (Signed)
Pt states the HYDROCODONE the Dr. Doristine Section her on, the insurance isn't paying for it and wanted to know if she could be given something else instead Please call pt at [phone]

## 2015-01-02 NOTE — Telephone Encounter (Signed)
I called the pharmacy to check on why the insurance will not pay for the hydrocodone syrup. The pharmacist told me that from experience her insurance may pay for the Tussionex instead. Can we send in a Rx for this. Please advise.

## 2015-01-02 NOTE — Telephone Encounter (Signed)
Can a PA review please?

## 2015-01-03 ENCOUNTER — Ambulatory Visit (INDEPENDENT_AMBULATORY_CARE_PROVIDER_SITE_OTHER): Payer: Commercial Managed Care - HMO | Admitting: Family Medicine

## 2015-01-03 VITALS — BP 120/86 | HR 96 | Temp 98.4°F | Resp 20 | Ht 61.5 in | Wt 300.2 lb

## 2015-01-03 DIAGNOSIS — J209 Acute bronchitis, unspecified: Secondary | ICD-10-CM | POA: Diagnosis not present

## 2015-01-03 MED ORDER — METHYLPREDNISOLONE ACETATE 80 MG/ML IJ SUSP
80.0000 mg | Freq: Once | INTRAMUSCULAR | Status: AC
  Administered 2015-01-03: 80 mg via INTRAMUSCULAR

## 2015-01-03 MED ORDER — ALBUTEROL SULFATE HFA 108 (90 BASE) MCG/ACT IN AERS: 2.0000 | INHALATION_SPRAY | Freq: Four times a day (QID) | RESPIRATORY_TRACT | Status: DC | PRN

## 2015-01-03 MED ORDER — BENZONATATE 200 MG PO CAPS: 200.0000 mg | ORAL_CAPSULE | Freq: Three times a day (TID) | ORAL | Status: DC | PRN

## 2015-01-03 NOTE — Telephone Encounter (Signed)
Pt called back. Her ins will not cover Tussionex nor Tessalon. Advised that she call her ins company and see what they will cover. She will do that and call us back. Advised that she hold on to the Tussionex rx so that she can bring it back if needed.

## 2015-01-03 NOTE — Patient Instructions (Signed)

## 2015-01-03 NOTE — Progress Notes (Signed)
This chart was scribed for Dr. [name], MD by Erling Conte, Medical Scribe. This patient was seen in Room 10 and the patient's care was started at 4:56 PM.  Patient ID: Brandi Gomez MRN: [id number], DOB: 1971/04/01, 44 y.o. Date of Encounter: 01/03/2015, 5:08 PM  Primary Physician: [name], MD  Chief Complaint:  Chief Complaint  Patient presents with  . Follow-up  . Cough    Persistent "barking" cough   . Medication Refill    Januvia  . Medication Management    Pt reports she stopped Prozac 2 days ago, because one of the listed side effects is cough     HPI: 44 y.o. year old female with history below presents with persistent barking cough for 2 weeks. She is having associated mild chest tightness accompanied with the cough. Pt was previously prescribed Hycodan for her symptoms but she states she was unable to get the prescription because her insurance would not cover it. She states that her cough has not improved. Pt states that her right heal has improved since the last visit. She denies any other complaints at this time.   Pt states that she stopped her Prozac medication 2 days ago because one of the listed side effects is a cough.   No sick contacts, recent antibiotics, or recent travels.   No leg trauma, sedentary periods, h/o cancer, or tobacco use.  Past Medical History  Diagnosis Date  . Type II or unspecified type diabetes mellitus without mention of complication, not stated as uncontrolled   . Hypertension   . Sleep apnea   . Anxiety   . Depression   . Bipolar 1 disorder   . Seizures     intractable  . Obesity   . Mild mental retardation   . Partial complex seizure disorder with intractable epilepsy 05/12/2014  . Anemia      Home Meds: Prior to Admission medications   Medication Sig Start Date End Date Taking? Authorizing Provider  diclofenac (VOLTAREN) 75 MG EC tablet Take 1 tablet (75 mg total) by mouth 2 (two) times daily. 12/25/14   Yes [name], MD  DILANTIN 100 MG ER capsule TAKE 2 CAPSULES IN THE MORNING AND 3 CAPSULES IN THE EVENING 08/07/14  Yes [name], MD  DILANTIN INFATABS 50 MG tablet CHEW 1 TABLETS BY MOUTH EVERY MORNING FOR SEIZURE CONTROL 09/14/14  Yes Ward Givens, NP  azithromycin (ZITHROMAX Z-PAK) 250 MG tablet Take as directed on pack Patient not taking: Reported on 01/03/2015 12/28/14   [name], MD  chlorpheniramine-HYDROcodone [facility] PENNKINETIC ER) 10-8 MG/5ML LQCR Take 5 mLs by mouth every 12 (twelve) hours as needed for cough (cough). Patient not taking: Reported on 01/03/2015 01/02/15   Chelle S Jeffery, PA-C  DILANTIN 100 MG ER capsule TAKE 2 CAPSULES IN THE MORNING AND 3 CAPSULES IN THE EVENING Patient not taking: Reported on 01/03/2015 11/11/14   [name], MD  FLUoxetine (PROZAC) 20 MG tablet Take 1 tablet (20 mg total) by mouth daily. Patient not taking: Reported on 01/03/2015 12/12/14   [name], MD  ibuprofen (ADVIL,MOTRIN) 200 MG tablet Take 200 mg by mouth every 6 (six) hours as needed.    Historical Provider, MD  sitaGLIPtin (JANUVIA) 50 MG tablet Take 1 tablet (50 mg total) by mouth daily. For control of blood sugar Patient not taking: Reported on 01/03/2015 08/26/14   [name], MD  torsemide (DEMADEX) 20 MG tablet Take 1 tablet (20 mg total) by mouth daily.  Patient not taking: Reported on 01/03/2015    [name], MD    Allergies:  Allergies  Allergen Reactions  . Amoxicillin Itching  . Hydrocodone Other (See Comments)    Depressed     History   Social History  . Marital Status: Single    Spouse Name: N/A    Number of Children: 3  . Years of Education: 12   Occupational History  . disabled   .     Social History Main Topics  . Smoking status: Never Smoker   . Smokeless tobacco: Never Used  . Alcohol Use: No  . Drug Use: No  . Sexual Activity: Not on file   Other Topics Concern  . Not on file   Social History Narrative    Patient lives at home with daughter.    Patient has 3 children.    Patient is right handed.    Patient has a high school education.    Patient is on disability     Review of Systems: Constitutional: negative for chills, fever, night sweats, weight changes, or fatigue  HEENT: negative for vision changes, hearing loss, congestion, rhinorrhea, ST, epistaxis, or sinus pressure Cardiovascular: negative for chest pain or palpitations Respiratory: positive for cough and chest tightness. negative for hemoptysis, wheezing, or shortness of breath,  Abdominal: negative for abdominal pain, nausea, vomiting, diarrhea, or constipation Dermatological: negative for rash Neurologic: negative for headache, dizziness, or syncope All other systems reviewed and are otherwise negative with the exception to those above and in the HPI.   Physical Exam: Blood pressure 120/86, pulse 96, temperature 98.4 F (36.9 C), temperature source Oral, resp. rate 20, height 5' 1.5" (1.562 m), weight 300 lb 3.2 oz (136.17 kg), last menstrual period 12/12/2014, SpO2 97 %., Body mass index is 55.81 kg/(m^2). General: Well developed, well nourished, in no acute distress. Head: Normocephalic, atraumatic, eyes without discharge, sclera non-icteric, nares are without discharge. Bilateral auditory canals clear, TM's are without perforation, pearly grey and translucent with reflective cone of light bilaterally. Oral cavity moist, posterior pharynx without exudate, erythema, peritonsillar abscess, or post nasal drip.  Neck: Supple. No thyromegaly. Full ROM. No lymphadenopathy. Lungs: Bilateral expiatory wheezes  Heart: RRR with S1 S2. No murmurs, rubs, or gallops appreciated. Abdomen: Soft, non-tender, non-distended with normoactive bowel sounds. No hepatomegaly. No rebound/guarding. No obvious abdominal masses. Msk:  Strength and tone normal for age. Extremities/Skin: Warm and dry. No clubbing or cyanosis. No edema. No rashes or  suspicious lesions. Neuro: Alert and oriented X 3. Moves all extremities spontaneously. Gait is normal. CNII-XII grossly in tact. Psych:  Responds to questions appropriately with a normal affect.    ASSESSMENT AND PLAN:  44 y.o. year old female with  1. Acute bronchitis, unspecified organism     Meds ordered this encounter  Medications  . benzonatate (TESSALON) 200 MG capsule    Sig: Take 1 capsule (200 mg total) by mouth 3 (three) times daily as needed for cough.    Dispense:  20 capsule    Refill:  0  . albuterol (PROVENTIL HFA;VENTOLIN HFA) 108 (90 BASE) MCG/ACT inhaler    Sig: Inhale 2 puffs into the lungs every 6 (six) hours as needed for wheezing or shortness of breath.    Dispense:  1 Inhaler    Refill:  0  . methylPREDNISolone acetate (DEPO-MEDROL) injection 80 mg    Sig:    This chart was scribed in my presence and reviewed by me personally.  ICD-9-CM ICD-10-CM   1. Acute bronchitis, unspecified organism 466.0 J20.9 benzonatate (TESSALON) 200 MG capsule     albuterol (PROVENTIL HFA;VENTOLIN HFA) 108 (90 BASE) MCG/ACT inhaler     methylPREDNISolone acetate (DEPO-MEDROL) injection 80 mg     Signed, [name], MD  Signed, [name], MD 01/03/2015 5:08 PM

## 2015-01-30 ENCOUNTER — Ambulatory Visit (INDEPENDENT_AMBULATORY_CARE_PROVIDER_SITE_OTHER): Payer: Commercial Managed Care - HMO | Admitting: Family Medicine

## 2015-01-30 VITALS — BP 126/82 | HR 86 | Temp 98.3°F | Resp 16 | Ht 61.0 in | Wt 303.0 lb

## 2015-01-30 DIAGNOSIS — R350 Frequency of micturition: Secondary | ICD-10-CM | POA: Diagnosis not present

## 2015-01-30 DIAGNOSIS — R319 Hematuria, unspecified: Secondary | ICD-10-CM

## 2015-01-30 DIAGNOSIS — R109 Unspecified abdominal pain: Secondary | ICD-10-CM

## 2015-01-30 DIAGNOSIS — N898 Other specified noninflammatory disorders of vagina: Secondary | ICD-10-CM

## 2015-01-30 DIAGNOSIS — R569 Unspecified convulsions: Secondary | ICD-10-CM

## 2015-01-30 DIAGNOSIS — E1165 Type 2 diabetes mellitus with hyperglycemia: Secondary | ICD-10-CM

## 2015-01-30 DIAGNOSIS — M25559 Pain in unspecified hip: Secondary | ICD-10-CM | POA: Diagnosis not present

## 2015-01-30 DIAGNOSIS — F3130 Bipolar disorder, current episode depressed, mild or moderate severity, unspecified: Secondary | ICD-10-CM

## 2015-01-30 DIAGNOSIS — N939 Abnormal uterine and vaginal bleeding, unspecified: Secondary | ICD-10-CM

## 2015-01-30 LAB — POCT URINALYSIS DIPSTICK
Bilirubin, UA: NEGATIVE
GLUCOSE UA: 250
Ketones, UA: NEGATIVE
LEUKOCYTES UA: NEGATIVE
NITRITE UA: NEGATIVE
Protein, UA: NEGATIVE
Spec Grav, UA: 1.02
Urobilinogen, UA: 0.2
pH, UA: 6

## 2015-01-30 LAB — POCT CBC
Granulocyte percent: 56.5 % (ref 37–80)
HCT, POC: 37.2 % — AB (ref 37.7–47.9)
Hemoglobin: 12 g/dL — AB (ref 12.2–16.2)
Lymph, poc: 3.3 (ref 0.6–3.4)
MCH, POC: 30.4 pg (ref 27–31.2)
MCHC: 32.3 g/dL (ref 31.8–35.4)
MCV: 94.1 fL (ref 80–97)
MID (cbc): 0.5 (ref 0–0.9)
MPV: 7.2 fL (ref 0–99.8)
POC Granulocyte: 4.9 (ref 2–6.9)
POC LYMPH PERCENT: 37.6 %L (ref 10–50)
POC MID %: 5.9 % (ref 0–12)
Platelet Count, POC: 275 10*3/uL (ref 142–424)
RBC: 3.95 M/uL — AB (ref 4.04–5.48)
RDW, POC: 14.2 %
WBC: 8.7 10*3/uL (ref 4.6–10.2)

## 2015-01-30 LAB — COMPLETE METABOLIC PANEL WITH GFR
ALT: 34 U/L (ref 0–35)
AST: 34 U/L (ref 0–37)
Alkaline Phosphatase: 211 U/L — ABNORMAL HIGH (ref 39–117)
Chloride: 101 mEq/L (ref 96–112)
Creat: 0.72 mg/dL (ref 0.50–1.10)
GFR, Est African American: >89 mL/min
GFR, Est Non African American: >89 mL/min
Sodium: 138 mEq/L (ref 135–145)
Total Bilirubin: 0.3 mg/dL (ref 0.2–1.2)
Total Protein: 7.5 g/dL (ref 6.0–8.3)

## 2015-01-30 LAB — POCT UA - MICROSCOPIC ONLY
CASTS, UR, LPF, POC: NEGATIVE
Crystals, Ur, HPF, POC: NEGATIVE
Mucus, UA: NEGATIVE
Yeast, UA: NEGATIVE

## 2015-01-30 LAB — POCT URINE PREGNANCY: Preg Test, Ur: NEGATIVE

## 2015-01-30 LAB — COMPLETE METABOLIC PANEL WITHOUT GFR
Albumin: 3.8 g/dL (ref 3.5–5.2)
BUN: 12 mg/dL (ref 6–23)
CO2: 29 meq/L (ref 19–32)
Calcium: 8.9 mg/dL (ref 8.4–10.5)
Glucose, Bld: 202 mg/dL — ABNORMAL HIGH (ref 70–99)
Potassium: 4.3 meq/L (ref 3.5–5.3)

## 2015-01-30 LAB — POCT GLYCOSYLATED HEMOGLOBIN (HGB A1C): Hemoglobin A1C: 6.8

## 2015-01-30 NOTE — Progress Notes (Signed)
Chief Complaint:  Chief Complaint  Patient presents with  . abnormal bleeding    x10 days now   . Abdominal Pain  . Urinary Frequency  . Insomnia    HPI: Brandi Gomez is a 44 y.o. female who is here for :  Intermittent abd pain in the pelvic area, associated with  Increase urinary  Frequency for the last 10 days, LMP was 12/30/14 She has had spotting for 10 days now, ittle spots on underwear, she had a little line onher underwear when she changes out her underwear, she denies hemorrhoids, she has been having contsipation, last time she had BM was this AM, she di dnot have constipation this morning, thismorning her BM was  regular. She feels her spotting is from her vagina and not from her urine. She is sexually active, she does not have more or less spotting after sex, she denies any pain with sex, She does not use acondom, she has one partner, she had a STD a "long time ago". Last pap was 3-4 years ago, does not remember if she had any abnormal paps.  She is eating well,no abd pain with food intake, denies any fevers, chills, nausea, vomtiing, back pain, pain with urination; no recent medication changes, she is not complaint with all her meds, she denies any diarrhea with abd pain, she denies recent travels.  She has had more polyuria but not polydipsia, sh eis diabetic but sometimes forgets to take her medicines, denies neuropathy She has a hx of seizures and also Bipolar and is on meds and has no SEs.    Past Medical History  Diagnosis Date  . Type II or unspecified type diabetes mellitus without mention of complication, not stated as uncontrolled   . Hypertension   . Sleep apnea   . Anxiety   . Depression   . Bipolar 1 disorder   . Seizures     intractable  . Obesity   . Mild mental retardation   . Partial complex seizure disorder with intractable epilepsy 05/12/2014  . Anemia    Past Surgical History  Procedure Laterality Date  . Nasal sinus surgery    .  Myringotomy with tube placement    . Colonoscopy      2012-normal , Dr Sharlett Iles  . Esophagogastroduodenoscopy      normal-Dr Sharlett Iles 2012   History   Social History  . Marital Status: Single    Spouse Name: N/A  . Number of Children: 3  . Years of Education: 12   Occupational History  . disabled   .     Social History Main Topics  . Smoking status: Never Smoker   . Smokeless tobacco: Never Used  . Alcohol Use: No  . Drug Use: No  . Sexual Activity: Not on file   Other Topics Concern  . None   Social History Narrative   Patient lives at home with daughter.    Patient has 3 children.    Patient is right handed.    Patient has a high school education.    Patient is on disability   Family History  Problem Relation Age of Onset  . Diabetes Mother     passed away from accidental death  . Mental illness Father   . Diabetes Daughter   . Cancer Daughter     leukemia  . Cancer Maternal Aunt     ovarian ca   Allergies  Allergen Reactions  . Amoxicillin Itching  .  Hydrocodone Other (See Comments)    Depressed    Prior to Admission medications   Medication Sig Start Date End Date Taking? Authorizing Provider  albuterol (PROVENTIL HFA;VENTOLIN HFA) 108 (90 BASE) MCG/ACT inhaler Inhale 2 puffs into the lungs every 6 (six) hours as needed for wheezing or shortness of breath. 01/03/15  Yes [name], MD  diclofenac (VOLTAREN) 75 MG EC tablet Take 1 tablet (75 mg total) by mouth 2 (two) times daily. 12/25/14  Yes [name], MD  DILANTIN 100 MG ER capsule TAKE 2 CAPSULES IN THE MORNING AND 3 CAPSULES IN THE EVENING 08/07/14  Yes [name], MD  DILANTIN INFATABS 50 MG tablet CHEW 1 TABLETS BY MOUTH EVERY MORNING FOR SEIZURE CONTROL 09/14/14  Yes Ward Givens, NP  FLUoxetine (PROZAC) 20 MG tablet Take 1 tablet (20 mg total) by mouth daily. 12/12/14  Yes [name], MD  sitaGLIPtin (JANUVIA) 50 MG tablet Take 1 tablet (50 mg total) by mouth daily. For  control of blood sugar 08/26/14  Yes [name], MD  torsemide (DEMADEX) 20 MG tablet Take 1 tablet (20 mg total) by mouth daily.   Yes [name], MD  azithromycin (ZITHROMAX Z-PAK) 250 MG tablet Take as directed on pack Patient not taking: Reported on 01/03/2015 12/28/14   [name], MD  benzonatate (TESSALON) 200 MG capsule Take 1 capsule (200 mg total) by mouth 3 (three) times daily as needed for cough. Patient not taking: Reported on 01/30/2015 01/03/15   [name], MD  chlorpheniramine-HYDROcodone [facility] ER) 10-8 MG/5ML [facility] Take 5 mLs by mouth every 12 (twelve) hours as needed for cough (cough). Patient not taking: Reported on 01/03/2015 01/02/15   Chelle S Jeffery, PA-C  DILANTIN 100 MG ER capsule TAKE 2 CAPSULES IN THE MORNING AND 3 CAPSULES IN THE EVENING Patient not taking: Reported on 01/03/2015 11/11/14   [name], MD  ibuprofen (ADVIL,MOTRIN) 200 MG tablet Take 200 mg by mouth every 6 (six) hours as needed.    Historical Provider, MD     ROS: The patient denies fevers, chills, night sweats, unintentional weight loss, chest pain, palpitations, wheezing, dyspnea on exertion, nausea, vomiting, dysuria, hematuria, melena, numbness, weakness, or tingling. + vaginal spotting, + icnrease frequency  All other systems have been reviewed and were otherwise negative with the exception of those mentioned in the HPI and as above.    PHYSICAL EXAM: Filed Vitals:   01/30/15 1122  BP: 126/82  Pulse: 86  Temp: 98.3 F (36.8 C)  Resp: 16   Filed Vitals:   01/30/15 1122  Height: 5\' 1"  (1.549 m)  Weight: 303 lb (137.44 kg)   Body mass index is 57.28 kg/(m^2).  General: Alert, no acute distress, slightly agitated AA obese female  HEENT:  Normocephalic, atraumatic, oropharynx patent. EOMI, PERRLA, tm nl, Fundo exam nl Cardiovascular:  Regular rate and rhythm, no rubs murmurs or gallops.  No Carotid bruits, radial pulse intact. No pedal edema.    Respiratory: Clear to auscultation bilaterally.  No wheezes, rales, or rhonchi.  No cyanosis, no use of accessory musculature GI: No organomegaly, abdomen is soft and non-tender, positive bowel sounds.  No masses. Skin: No rashes. Neurologic: Facial musculature symmetric. Psychiatric: Patient is appropriate throughout our interaction. Lymphatic: No cervical lymphadenopathy Musculoskeletal: Gait intact.   LABS: Results for orders placed or performed in visit on 01/30/15  POCT urinalysis dipstick  Result Value Ref Range   Color, UA yellow    Clarity, UA clear  Glucose, UA 250    Bilirubin, UA neg    Ketones, UA neg    Spec Grav, UA 1.020    Blood, UA tr-intact    pH, UA 6.0    Protein, UA neg    Urobilinogen, UA 0.2    Nitrite, UA neg    Leukocytes, UA Negative   POCT UA - Microscopic Only  Result Value Ref Range   WBC, Ur, HPF, POC 0-3    RBC, urine, microscopic 3-5    Bacteria, U Microscopic small    Mucus, UA neg    Epithelial cells, urine per micros 0-5    Crystals, Ur, HPF, POC neg    Casts, Ur, LPF, POC neg    Yeast, UA neg   POCT CBC  Result Value Ref Range   WBC 8.7 4.6 - 10.2 K/uL   Lymph, poc 3.3 0.6 - 3.4   POC LYMPH PERCENT 37.6 10 - 50 %L   MID (cbc) 0.5 0 - 0.9   POC MID % 5.9 0 - 12 %M   POC Granulocyte 4.9 2 - 6.9   Granulocyte percent 56.5 37 - 80 %G   RBC 3.95 (A) 4.04 - 5.48 M/uL   Hemoglobin 12.0 (A) 12.2 - 16.2 g/dL   HCT, POC 37.2 (A) 37.7 - 47.9 %   MCV 94.1 80 - 97 fL   MCH, POC 30.4 27 - 31.2 pg   MCHC 32.3 31.8 - 35.4 g/dL   RDW, POC 14.2 %   Platelet Count, POC 275 142 - 424 K/uL   MPV 7.2 0 - 99.8 fL  POCT urine pregnancy  Result Value Ref Range   Preg Test, Ur Negative   POCT glycosylated hemoglobin (Hb A1C)  Result Value Ref Range   Hemoglobin A1C 6.8      EKG/XRAY:   Primary read interpreted by Dr. Marin Comment at [facility].   ASSESSMENT/PLAN: Encounter Diagnoses  Name Primary?  . Frequent urination Yes  . Abdominal pain,  unspecified abdominal location   . Blood in urine   . Pain in joint, pelvic region and thigh, unspecified laterality   . Seizures   . Bipolar disorder, most recent episode depressed, remission status unspecified   . Vaginal spotting   . Type 2 diabetes mellitus with hyperglycemia     44 y/o AA female with PMH of MR, seizures, OSA, bipolar, T2DM without neuropathy presents with 10 day hx of minimal vaginal spotting and pelvic pain. LAst pap was 3-4 years ago, no prior abnormal paps CBC is stable, UA normal, pregnancy test negative Labs pending: CMP She declines to have a pap or wet prep done, agitated that has had to wait so long Will defer for future appt with Dr Joseph Art, she will see him at 104 appt office Increase urinary frequency without dysuria due to poorly controlled DM vs  ? Cystitis vs GU infection  ( but again  refuseses pap and wet prep, unable to get Gc uriprobe since clean catch) . She is also on torsemide diuretic Fu prn   Gross sideeffects, risk and benefits, and alternatives of medications d/w patient. Patient is aware that all medications have potential sideeffects and we are unable to predict every sideeffect or drug-drug interaction that may occur.  Cathy Ropp, [place], DO 01/30/2015 1:03 PM

## 2015-02-03 ENCOUNTER — Ambulatory Visit (INDEPENDENT_AMBULATORY_CARE_PROVIDER_SITE_OTHER): Payer: Commercial Managed Care - HMO | Admitting: Family Medicine

## 2015-02-03 VITALS — BP 146/89 | HR 74 | Temp 98.8°F | Resp 18 | Wt 301.0 lb

## 2015-02-03 DIAGNOSIS — R1030 Lower abdominal pain, unspecified: Secondary | ICD-10-CM | POA: Diagnosis not present

## 2015-02-03 DIAGNOSIS — R079 Chest pain, unspecified: Secondary | ICD-10-CM

## 2015-02-03 DIAGNOSIS — N898 Other specified noninflammatory disorders of vagina: Secondary | ICD-10-CM | POA: Diagnosis not present

## 2015-02-03 DIAGNOSIS — N920 Excessive and frequent menstruation with regular cycle: Secondary | ICD-10-CM

## 2015-02-03 DIAGNOSIS — R1011 Right upper quadrant pain: Secondary | ICD-10-CM

## 2015-02-03 LAB — POCT URINALYSIS DIPSTICK
Bilirubin, UA: NEGATIVE
Glucose, UA: NEGATIVE
Ketones, UA: NEGATIVE
Leukocytes, UA: NEGATIVE
Nitrite, UA: NEGATIVE
Spec Grav, UA: 1.025
Urobilinogen, UA: 0.2
pH, UA: 5.5

## 2015-02-03 LAB — POCT UA - MICROSCOPIC ONLY
Casts, Ur, LPF, POC: NEGATIVE
Crystals, Ur, HPF, POC: NEGATIVE
Mucus, UA: NEGATIVE
Yeast, UA: NEGATIVE

## 2015-02-03 LAB — POCT URINE PREGNANCY: Preg Test, Ur: NEGATIVE

## 2015-02-03 LAB — GLUCOSE, POCT (MANUAL RESULT ENTRY): POC Glucose: 132 mg/dl — AB (ref 70–99)

## 2015-02-03 MED ORDER — RANITIDINE HCL 300 MG PO TABS: 300.0000 mg | ORAL_TABLET | Freq: Every day | ORAL | Status: DC

## 2015-02-03 NOTE — Progress Notes (Addendum)
Subjective:    Patient ID: Brandi Gomez, female    DOB: 12-Jul-1971, 44 y.o.   MRN: [id number]  This chart was scribed for [name], MD, by Stephania Fragmin, ED Scribe. This patient was seen in room 8 and the patient's care was started at 12:40 PM.   Chief Complaint  Patient presents with   Follow-up   Abdominal Pain    HPI  HPI Comments: Brandi Gomez is a 44 y.o. female who presents to the [facility] complaining of lower abdominal pain and some epigastric pain. This been going on for over a week.  Patient was seen by Dr. Truman Hayward several days ago but she didn't feel comfortable in pursuing the abdominal pain at that time.  Patient has been having some spotting and has not had a regular period in several months. She says she feels like something is falling out of her. She's had no fever or nausea or vomiting.  Patient is not had a Pap test in many years. She is sexually active.   Review of Systems  Gastrointestinal: Positive for abdominal pain.       Objective:   Physical Exam  Constitutional: She is oriented to person, place, and time. She appears well-developed and well-nourished. No distress.  HENT:  Head: Normocephalic and atraumatic.  Eyes: Conjunctivae and EOM are normal.  Neck: Neck supple. No tracheal deviation present.  Cardiovascular: Normal rate.   Pulmonary/Chest: Effort normal. No respiratory distress.  Musculoskeletal: Normal range of motion.  Neurological: She is alert and oriented to person, place, and time.  Skin: Skin is warm and dry.  Psychiatric: She has a normal mood and affect. Her behavior is normal.  Nursing note and vitals reviewed.  pelvic exam reveals some descensus of the uterus with small amount of spotting coming from the cervical os. Patient has morbid obesity. Results for orders placed or performed in visit on 02/03/15  POCT glucose (manual entry)  Result Value Ref Range   POC Glucose 132 (A) 70 - 99 mg/dl    POCT urinalysis dipstick  Result Value Ref Range   Color, UA yellow    Clarity, UA slightly cloudy    Glucose, UA neg    Bilirubin, UA neg    Ketones, UA neg    Spec Grav, UA 1.025    Blood, UA large    pH, UA 5.5    Protein, UA trace    Urobilinogen, UA 0.2    Nitrite, UA neg    Leukocytes, UA Negative   POCT UA - Microscopic Only  Result Value Ref Range   WBC, Ur, HPF, POC 2-4    RBC, urine, microscopic 6-8    Bacteria, U Microscopic trace    Mucus, UA neg    Epithelial cells, urine per micros 0-6    Crystals, Ur, HPF, POC neg    Casts, Ur, LPF, POC neg    Yeast, UA neg   POCT urine pregnancy  Result Value Ref Range   Preg Test, Ur Negative        Assessment & Plan:    Recheck in 2 weeks This chart was scribed in my presence and reviewed by me personally.    ICD-9-CM ICD-10-CM   1. Lower abdominal pain 789.09 R10.30 POCT glucose (manual entry)     POCT urinalysis dipstick     POCT UA - Microscopic Only     POCT urine pregnancy     Ambulatory referral to Gynecology  2. Abdominal pain, right upper quadrant 789.01 R10.11 POCT glucose (manual entry)     POCT urinalysis dipstick     POCT UA - Microscopic Only     POCT urine pregnancy     US Abdomen Limited RUQ     ranitidine (ZANTAC) 300 MG tablet     Pap IG, CT/NG w/ reflex HPV when ASC-U  3. Spotting 623.8 N89.8 POCT urine pregnancy     Ambulatory referral to Gynecology     Pap IG, CT/NG w/ reflex HPV when ASC-U  4. Chest pain, unspecified chest pain type 786.50 R07.9 ranitidine (ZANTAC) 300 MG tablet     Signed, [name], MD

## 2015-02-03 NOTE — Patient Instructions (Signed)
I am referring you to a gynecologist. Were also, check on your gallbladder.  I want you to come back in 2 weeks.  I'm ordering some acid reducing medicine called Zantac.

## 2015-02-07 LAB — PAP IG, CT-NG, RFX HPV ASCU
Chlamydia Probe Amp: NEGATIVE
GC Probe Amp: NEGATIVE

## 2015-02-13 ENCOUNTER — Ambulatory Visit
Admission: RE | Admit: 2015-02-13 | Discharge: 2015-02-13 | Disposition: A | Discharge status: Home / Self Care | Payer: Medicaid Other | Source: Ambulatory Visit | Attending: Family Medicine | Admitting: Family Medicine

## 2015-02-13 DIAGNOSIS — R1011 Right upper quadrant pain: Secondary | ICD-10-CM

## 2015-03-01 ENCOUNTER — Other Ambulatory Visit: Payer: Self-pay

## 2015-03-01 DIAGNOSIS — E669 Obesity, unspecified: Secondary | ICD-10-CM

## 2015-03-01 DIAGNOSIS — E1169 Type 2 diabetes mellitus with other specified complication: Secondary | ICD-10-CM

## 2015-03-01 DIAGNOSIS — R1011 Right upper quadrant pain: Secondary | ICD-10-CM

## 2015-03-01 DIAGNOSIS — R079 Chest pain, unspecified: Secondary | ICD-10-CM

## 2015-03-01 NOTE — Telephone Encounter (Signed)
I don't know of any glaucoma machine for home use.   If she needs a glucose monitor, of course she can have one of her choice with lancets and test strips for daily bid use x 1 year. I have not idea how to order the latter. If the depression is not lifting, she needs to come back in.

## 2015-03-01 NOTE — Telephone Encounter (Signed)
[employer] company is calling on behalf of patient because she needs a prescription for a glucoma machine sent to [facility] on [address]. and she also needs an A1C done on her next visit. She also added that the medication that was recently prescribed for depression is not working for the patient and suggestion that maybe there needs to be a dosage change.

## 2015-03-01 NOTE — Telephone Encounter (Signed)
Should pt just come in for an office visit to discuss these concerns from her insurance company? i believe this would be easier. Please advise.

## 2015-03-02 NOTE — Telephone Encounter (Signed)
Spoke with pt, she will come in on Sunday to be seen by Dr. Joseph Art.

## 2015-03-03 MED ORDER — RANITIDINE HCL 300 MG PO TABS: 300.0000 mg | ORAL_TABLET | Freq: Every day | ORAL | Status: DC

## 2015-03-03 MED ORDER — SITAGLIPTIN PHOSPHATE 50 MG PO TABS: 50.0000 mg | ORAL_TABLET | Freq: Every day | ORAL | Status: DC

## 2015-03-03 NOTE — Addendum Note (Signed)
Addended by: Elwyn Reach A on: 03/03/2015 04:45 PM   Modules accepted: Orders

## 2015-03-03 NOTE — Telephone Encounter (Addendum)
Got RF req from [facility] mail order for prozac, januvia, and ranitidine. I did not want to send in until pt is seen on Sunday in case adjustments are made. Dr L, please send in any of these meds you want her to have at time of OV Sunday. Pended 2 of them, but not prozac since pt is not doing well on it.

## 2015-03-06 ENCOUNTER — Other Ambulatory Visit: Payer: Self-pay

## 2015-03-06 DIAGNOSIS — F329 Major depressive disorder, single episode, unspecified: Secondary | ICD-10-CM

## 2015-03-06 DIAGNOSIS — F32A Depression, unspecified: Secondary | ICD-10-CM

## 2015-03-06 DIAGNOSIS — M722 Plantar fascial fibromatosis: Secondary | ICD-10-CM

## 2015-03-06 MED ORDER — DICLOFENAC SODIUM 75 MG PO TBEC: 75.0000 mg | DELAYED_RELEASE_TABLET | Freq: Two times a day (BID) | ORAL | Status: DC

## 2015-03-06 MED ORDER — FLUOXETINE HCL 20 MG PO TABS: 20.0000 mg | ORAL_TABLET | Freq: Every day | ORAL | Status: DC

## 2015-03-06 NOTE — Telephone Encounter (Signed)
[employer] reqs RF of Diclofenac and dilantin. I have sent note back that Dr Rexene Alberts Rxs the Dilantin. Dr Joseph Art, do you want to RF the diclofenac you Rxd for pt's foot pain in Jan?

## 2015-03-14 ENCOUNTER — Ambulatory Visit (INDEPENDENT_AMBULATORY_CARE_PROVIDER_SITE_OTHER): Payer: Commercial Managed Care - HMO | Admitting: Family Medicine

## 2015-03-14 VITALS — BP 124/80 | HR 67 | Temp 98.5°F | Resp 20 | Ht 61.0 in | Wt 301.4 lb

## 2015-03-14 DIAGNOSIS — E1165 Type 2 diabetes mellitus with hyperglycemia: Secondary | ICD-10-CM

## 2015-03-14 DIAGNOSIS — R5382 Chronic fatigue, unspecified: Secondary | ICD-10-CM | POA: Diagnosis not present

## 2015-03-14 DIAGNOSIS — E669 Obesity, unspecified: Secondary | ICD-10-CM | POA: Diagnosis not present

## 2015-03-14 DIAGNOSIS — E119 Type 2 diabetes mellitus without complications: Secondary | ICD-10-CM | POA: Diagnosis not present

## 2015-03-14 DIAGNOSIS — M25559 Pain in unspecified hip: Secondary | ICD-10-CM | POA: Diagnosis not present

## 2015-03-14 DIAGNOSIS — D539 Nutritional anemia, unspecified: Secondary | ICD-10-CM | POA: Diagnosis not present

## 2015-03-14 LAB — POCT CBC
Granulocyte percent: 55 %G (ref 37–80)
HCT, POC: 35.1 % — AB (ref 37.7–47.9)
Hemoglobin: 11.2 g/dL — AB (ref 12.2–16.2)
Lymph, poc: 3 (ref 0.6–3.4)
MCH, POC: 29.3 pg (ref 27–31.2)
MCHC: 31.9 g/dL (ref 31.8–35.4)
MCV: 92.1 fL (ref 80–97)
MID (cbc): 0.3 (ref 0–0.9)
MPV: 7.8 fL (ref 0–99.8)
POC Granulocyte: 4.1 (ref 2–6.9)
POC LYMPH PERCENT: 40.5 %L (ref 10–50)
POC MID %: 4.5 %M (ref 0–12)
Platelet Count, POC: 310 10*3/uL (ref 142–424)
RBC: 3.81 M/uL — AB (ref 4.04–5.48)
RDW, POC: 14.7 %
WBC: 7.5 10*3/uL (ref 4.6–10.2)

## 2015-03-14 LAB — GLUCOSE, POCT (MANUAL RESULT ENTRY): POC Glucose: 119 mg/dl — AB (ref 70–99)

## 2015-03-14 NOTE — Progress Notes (Addendum)
Patient ID: Brandi Gomez, female   DOB: 31-Aug-1971, 44 y.o.   MRN: [id number]   This chart was scribed for [name], MD by [facility], medical scribe at [facility].The patient was seen in exam room 13 and the patient's care was started at 5:47 PM.  Patient ID: Brandi Gomez MRN: [id number], DOB: [birth year], 44 y.o. Date of Encounter: 03/14/2015  Primary Physician: [name], MD  Chief Complaint:  Chief Complaint  Patient presents with   Diabetes    Patient states insurance wants her to have her A1c checked   Follow-up    Referral to specialist   HPI:  Brandi Gomez is a 44 y.o. female who presents to [facility] for a follow up. She would like a referral to a gynecologist for her abdominal pain. Her abdominal pain is intermittent, last was 3-4 days ago. She describes it has sharp and last 10 min when she gets up and walks. She denies unusual bleeding. Her last period was 8 days ago, and it lasted about 4-5 days. Her periods are generally regular. Pt does have fatigue and headaches. She has not seen a gynecologist. Her appetitie has been the same. She is unsure of her blood sugar. Pt goes to the bathroom about 5-6 times a night. She need another Accu-Chek. She denies dysuria, and fever.  Past Medical History  Diagnosis Date   Type II or unspecified type diabetes mellitus without mention of complication, not stated as uncontrolled    Hypertension    Sleep apnea    Anxiety    Depression    Bipolar 1 disorder    Seizures     intractable   Obesity    Mild mental retardation    Partial complex seizure disorder with intractable epilepsy 05/12/2014   Anemia     Home Meds: Prior to Admission medications   Medication Sig Start Date End Date Taking? Authorizing Provider  diclofenac (VOLTAREN) 75 MG EC tablet Take 1 tablet (75 mg total) by mouth 2 (two) times daily. 03/06/15  Yes [name], MD  DILANTIN 100  MG ER capsule TAKE 2 CAPSULES IN THE MORNING AND 3 CAPSULES IN THE EVENING 08/07/14  Yes [name], MD  DILANTIN INFATABS 50 MG tablet CHEW 1 TABLETS BY MOUTH EVERY MORNING FOR SEIZURE CONTROL 09/14/14  Yes Megan P Millikan, NP  FLUoxetine (PROZAC) 20 MG tablet Take 1 tablet (20 mg total) by mouth daily. 03/06/15  Yes [name], MD  ibuprofen (ADVIL,MOTRIN) 200 MG tablet Take 200 mg by mouth every 6 (six) hours as needed.   Yes Historical Provider, MD  ranitidine (ZANTAC) 300 MG tablet Take 1 tablet (300 mg total) by mouth at bedtime. 03/03/15  Yes [name], MD  sitaGLIPtin (JANUVIA) 50 MG tablet Take 1 tablet (50 mg total) by mouth daily. For control of blood sugar 03/03/15  Yes [name], MD  torsemide (DEMADEX) 20 MG tablet Take 1 tablet (20 mg total) by mouth daily.   Yes [name], MD   Allergies:  Allergies  Allergen Reactions   Amoxicillin Itching   Hydrocodone Other (See Comments)    Depressed    History   Social History   Marital Status: Single    Spouse Name: N/A   Number of Children: 3   Years of Education: 12   Occupational History   disabled        Social History Main Topics   Smoking status: Never  Smoker    Smokeless tobacco: Never Used   Alcohol Use: No   Drug Use: No   Sexual Activity: Not on file   Other Topics Concern   Not on file   Social History Narrative   Patient lives at home with daughter.    Patient has 3 children.    Patient is right handed.    Patient has a high school education.    Patient is on disability    Review of Systems: Constitutional: negative for chills, fever, night sweats, weight changes. Positive for fatigue HEENT: negative for vision changes, hearing loss, congestion, rhinorrhea, ST, epistaxis, or sinus pressure Cardiovascular: negative for chest pain or palpitations Respiratory: negative for hemoptysis, wheezing, shortness of breath, or cough Abdominal: negative for nausea, vomiting,  diarrhea, or constipation. Positive for abdominal pain. Dermatological: negative for rash Genitourinary: Positive for increased urinary frequency. Neurologic: negative for dizziness, or syncope. Positive for headache. All other systems reviewed and are otherwise negative with the exception to those above and in the HPI.  Physical Exam: Blood pressure 124/80, pulse 67, temperature 98.5 F (36.9 C), temperature source Oral, resp. rate 20, height 5\' 1"  (1.549 m), weight 301 lb 6 oz (136.703 kg), last menstrual period 03/06/2015, SpO2 97 %., Body mass index is 56.97 kg/(m^2). General: Well developed, well nourished, in no acute distress. Obese. Head: Normocephalic, atraumatic, eyes without discharge, sclera non-icteric, nares are without discharge. Bilateral auditory canals clear, TM's are without perforation, pearly grey and translucent with reflective cone of light bilaterally. Oral cavity moist, posterior pharynx without exudate, erythema, peritonsillar abscess, or post nasal drip.  Neck: Supple. No thyromegaly. Full ROM. No lymphadenopathy. Lungs: Clear bilaterally to auscultation without wheezes, rales, or rhonchi. Breathing is unlabored. Heart: RRR with S1 S2. No murmurs, rubs, or gallops appreciated. Abdomen: Soft, non-tender, non-distended with normoactive bowel sounds. No hepatomegaly. No rebound/guarding. No obvious abdominal masses. Msk:  Strength and tone normal for age. Extremities/Skin: Warm and dry. No clubbing or cyanosis. No edema. No rashes or suspicious lesions. Neuro: Alert and oriented X 3. Moves all extremities spontaneously. Gait is normal. CNII-XII grossly in tact. Psych:  Responds to questions appropriately with a normal affect.   Labs: Results for orders placed or performed in visit on 03/14/15  POCT CBC  Result Value Ref Range   WBC 7.5 4.6 - 10.2 K/uL   Lymph, poc 3.0 0.6 - 3.4   POC LYMPH PERCENT 40.5 10 - 50 %L   MID (cbc) 0.3 0 - 0.9   POC MID % 4.5 0 - 12 %M    POC Granulocyte 4.1 2 - 6.9   Granulocyte percent 55.0 37 - 80 %G   RBC 3.81 (A) 4.04 - 5.48 M/uL   Hemoglobin 11.2 (A) 12.2 - 16.2 g/dL   HCT, POC 35.1 (A) 37.7 - 47.9 %   MCV 92.1 80 - 97 fL   MCH, POC 29.3 27 - 31.2 pg   MCHC 31.9 31.8 - 35.4 g/dL   RDW, POC 14.7 %   Platelet Count, POC 310 142 - 424 K/uL   MPV 7.8 0 - 99.8 fL  POCT glucose (manual entry)  Result Value Ref Range   POC Glucose 119 (A) 70 - 99 mg/dl     ASSESSMENT AND PLAN:  44 y.o. year old female with  This chart was scribed in my presence and reviewed by me personally.    ICD-9-CM ICD-10-CM   1. Pain in joint, pelvic region and thigh, unspecified laterality 719.45 M25.559  POCT CBC     POCT glucose (manual entry)     POCT glycosylated hemoglobin (Hb A1C)     Ambulatory referral to Gynecology    Signed, [name], MD 03/14/2015 6:18 PM

## 2015-03-15 LAB — HEMOGLOBIN A1C
Hgb A1c MFr Bld: 7 % — ABNORMAL HIGH (ref <5.7)
Mean Plasma Glucose: 154 mg/dL — ABNORMAL HIGH (ref <117)

## 2015-03-15 LAB — TSH: TSH: 1.07 u[IU]/mL (ref 0.350–4.500)

## 2015-03-15 LAB — FERRITIN: Ferritin: 42 ng/mL (ref 10–291)

## 2015-05-13 ENCOUNTER — Encounter (HOSPITAL_COMMUNITY): Payer: Self-pay | Admitting: *Deleted

## 2015-05-13 ENCOUNTER — Emergency Department (HOSPITAL_COMMUNITY)
Admission: EM | Admit: 2015-05-13 | Discharge: 2015-05-13 | Discharge status: Against Medical Advice | Payer: Medicaid Other | Attending: Emergency Medicine | Admitting: Emergency Medicine

## 2015-05-13 ENCOUNTER — Emergency Department (HOSPITAL_COMMUNITY): Admission: EM | Admit: 2015-05-13 | Discharge: 2015-05-13 | Discharge status: Absent without leave | Payer: Self-pay

## 2015-05-13 DIAGNOSIS — E669 Obesity, unspecified: Secondary | ICD-10-CM | POA: Diagnosis not present

## 2015-05-13 DIAGNOSIS — E119 Type 2 diabetes mellitus without complications: Secondary | ICD-10-CM | POA: Diagnosis not present

## 2015-05-13 DIAGNOSIS — R51 Headache: Secondary | ICD-10-CM | POA: Diagnosis present

## 2015-05-13 DIAGNOSIS — I10 Essential (primary) hypertension: Secondary | ICD-10-CM | POA: Insufficient documentation

## 2015-05-13 NOTE — ED Notes (Signed)
Pt left tired of waiting.. She threatened to walk out 20 minutes eralier i asked her to wait

## 2015-05-14 ENCOUNTER — Emergency Department (HOSPITAL_COMMUNITY)
Admission: EM | Admit: 2015-05-14 | Discharge: 2015-05-15 | Disposition: A | Discharge status: Home / Self Care | Payer: Commercial Managed Care - HMO | Attending: Emergency Medicine | Admitting: Emergency Medicine

## 2015-05-14 DIAGNOSIS — F319 Bipolar disorder, unspecified: Secondary | ICD-10-CM | POA: Diagnosis not present

## 2015-05-14 DIAGNOSIS — R51 Headache: Secondary | ICD-10-CM | POA: Diagnosis not present

## 2015-05-14 DIAGNOSIS — Z79899 Other long term (current) drug therapy: Secondary | ICD-10-CM | POA: Diagnosis not present

## 2015-05-14 DIAGNOSIS — E669 Obesity, unspecified: Secondary | ICD-10-CM | POA: Insufficient documentation

## 2015-05-14 DIAGNOSIS — Z862 Personal history of diseases of the blood and blood-forming organs and certain disorders involving the immune mechanism: Secondary | ICD-10-CM | POA: Insufficient documentation

## 2015-05-14 DIAGNOSIS — R519 Headache, unspecified: Secondary | ICD-10-CM

## 2015-05-14 DIAGNOSIS — F419 Anxiety disorder, unspecified: Secondary | ICD-10-CM | POA: Insufficient documentation

## 2015-05-14 DIAGNOSIS — I10 Essential (primary) hypertension: Secondary | ICD-10-CM | POA: Diagnosis not present

## 2015-05-14 DIAGNOSIS — E119 Type 2 diabetes mellitus without complications: Secondary | ICD-10-CM | POA: Diagnosis not present

## 2015-05-15 ENCOUNTER — Telehealth: Payer: Self-pay | Admitting: Neurology

## 2015-05-15 ENCOUNTER — Emergency Department (HOSPITAL_COMMUNITY): Payer: Commercial Managed Care - HMO

## 2015-05-15 ENCOUNTER — Encounter (HOSPITAL_COMMUNITY): Payer: Self-pay | Admitting: Emergency Medicine

## 2015-05-15 DIAGNOSIS — E119 Type 2 diabetes mellitus without complications: Secondary | ICD-10-CM | POA: Diagnosis not present

## 2015-05-15 DIAGNOSIS — F419 Anxiety disorder, unspecified: Secondary | ICD-10-CM | POA: Diagnosis not present

## 2015-05-15 DIAGNOSIS — Z79899 Other long term (current) drug therapy: Secondary | ICD-10-CM | POA: Diagnosis not present

## 2015-05-15 DIAGNOSIS — F319 Bipolar disorder, unspecified: Secondary | ICD-10-CM | POA: Diagnosis not present

## 2015-05-15 DIAGNOSIS — R51 Headache: Secondary | ICD-10-CM | POA: Diagnosis not present

## 2015-05-15 DIAGNOSIS — I10 Essential (primary) hypertension: Secondary | ICD-10-CM | POA: Diagnosis not present

## 2015-05-15 DIAGNOSIS — Z862 Personal history of diseases of the blood and blood-forming organs and certain disorders involving the immune mechanism: Secondary | ICD-10-CM | POA: Diagnosis not present

## 2015-05-15 DIAGNOSIS — E669 Obesity, unspecified: Secondary | ICD-10-CM | POA: Diagnosis not present

## 2015-05-15 LAB — CBC WITH DIFFERENTIAL/PLATELET
BASOS PCT: 0 % (ref 0–1)
Basophils Absolute: 0 10*3/uL (ref 0.0–0.1)
EOS ABS: 0.2 10*3/uL (ref 0.0–0.7)
Eosinophils Relative: 2 % (ref 0–5)
HCT: 32.2 % — ABNORMAL LOW (ref 36.0–46.0)
Hemoglobin: 10.5 g/dL — ABNORMAL LOW (ref 12.0–15.0)
Lymphocytes Relative: 31 % (ref 12–46)
Lymphs Abs: 2.6 10*3/uL (ref 0.7–4.0)
MCH: 30.3 pg (ref 26.0–34.0)
MCHC: 32.6 g/dL (ref 30.0–36.0)
MCV: 92.8 fL (ref 78.0–100.0)
Monocytes Absolute: 0.4 10*3/uL (ref 0.1–1.0)
Monocytes Relative: 5 % (ref 3–12)
NEUTROS ABS: 5.3 10*3/uL (ref 1.7–7.7)
NEUTROS PCT: 62 % (ref 43–77)
Platelets: 236 10*3/uL (ref 150–400)
RBC: 3.47 MIL/uL — ABNORMAL LOW (ref 3.87–5.11)
RDW: 13.2 % (ref 11.5–15.5)
WBC: 8.6 10*3/uL (ref 4.0–10.5)

## 2015-05-15 LAB — BASIC METABOLIC PANEL
ANION GAP: 7 (ref 5–15)
BUN: 17 mg/dL (ref 6–20)
CHLORIDE: 103 mmol/L (ref 101–111)
CO2: 28 mmol/L (ref 22–32)
Calcium: 8.3 mg/dL — ABNORMAL LOW (ref 8.9–10.3)
Creatinine, Ser: 0.81 mg/dL (ref 0.44–1.00)
GFR calc non Af Amer: >60 mL/min (ref >60)
Glucose, Bld: 218 mg/dL — ABNORMAL HIGH (ref 65–99)
Potassium: 3.7 mmol/L (ref 3.5–5.1)
SODIUM: 138 mmol/L (ref 135–145)

## 2015-05-15 MED ORDER — KETOROLAC TROMETHAMINE 30 MG/ML IJ SOLN
30.0000 mg | Freq: Once | INTRAMUSCULAR | Status: AC
  Administered 2015-05-15: 30 mg via INTRAVENOUS
  Filled 2015-05-15: qty 1

## 2015-05-15 MED ORDER — METOCLOPRAMIDE HCL 5 MG/ML IJ SOLN
10.0000 mg | Freq: Once | INTRAMUSCULAR | Status: AC
  Administered 2015-05-15: 10 mg via INTRAVENOUS
  Filled 2015-05-15: qty 2

## 2015-05-15 MED ORDER — SODIUM CHLORIDE 0.9 % IV BOLUS (SEPSIS)
1000.0000 mL | Freq: Once | INTRAVENOUS | Status: AC
Start: 2015-05-15 — End: 2015-05-15
  Administered 2015-05-15: 1000 mL via INTRAVENOUS

## 2015-05-15 MED ORDER — DIPHENHYDRAMINE HCL 50 MG/ML IJ SOLN
25.0000 mg | Freq: Once | INTRAMUSCULAR | Status: AC
  Administered 2015-05-15: 25 mg via INTRAVENOUS
  Filled 2015-05-15: qty 1

## 2015-05-15 NOTE — Discharge Instructions (Signed)
Follow up with your doctor for further evaluation. Refer to attached documents for more information.

## 2015-05-15 NOTE — ED Provider Notes (Signed)
CSN: [id number]     Arrival date & time 05/14/15  2347 History   First MD Initiated Contact with Patient 05/15/15 0110     Chief Complaint  Patient presents with  . Headache     (Consider location/radiation/quality/duration/timing/severity/associated sxs/prior Treatment) HPI Comments: Patient is a 44 year old female with a past medical history of OSA, diabetes, hypertension, and migraines who presents with a headache for 3 days. Patient reports a gradual onset and progressive worsening of the headache. The pain is sharp, constant and is located in the right temporal area without radiation. Patient has tried nothing for symptoms without relief. No alleviating/aggravating factors. Patient reports associated nausea and photophobia. Patient denies fever, vomiting, diarrhea, numbness/tingling, weakness, visual changes, congestion, chest pain, SOB, abdominal pain.      Past Medical History  Diagnosis Date  . Type II or unspecified type diabetes mellitus without mention of complication, not stated as uncontrolled   . Hypertension   . Sleep apnea   . Anxiety   . Depression   . Bipolar 1 disorder   . Seizures     intractable  . Obesity   . Mild mental retardation   . Partial complex seizure disorder with intractable epilepsy 05/12/2014  . Anemia    Past Surgical History  Procedure Laterality Date  . Nasal sinus surgery    . Myringotomy with tube placement    . Colonoscopy      2012-normal , Dr Sharlett Iles  . Esophagogastroduodenoscopy      normal-Dr Sharlett Iles 2012   Family History  Problem Relation Age of Onset  . Diabetes Mother     passed away from accidental death  . Mental illness Father   . Diabetes Daughter   . Cancer Daughter     leukemia  . Cancer Maternal Aunt     ovarian ca   History  Substance Use Topics  . Smoking status: Never Smoker   . Smokeless tobacco: Never Used  . Alcohol Use: No   OB History    No data available     Review of Systems  Constitutional:  Negative for fever, chills and fatigue.  HENT: Negative for trouble swallowing.   Eyes: Negative for visual disturbance.  Respiratory: Negative for shortness of breath.   Cardiovascular: Negative for chest pain and palpitations.  Gastrointestinal: Negative for nausea, vomiting, abdominal pain and diarrhea.  Genitourinary: Negative for dysuria and difficulty urinating.  Musculoskeletal: Negative for arthralgias and neck pain.  Skin: Negative for color change.  Neurological: Positive for headaches. Negative for dizziness and weakness.  Psychiatric/Behavioral: Negative for dysphoric mood.      Allergies  Amoxicillin and Hydrocodone  Home Medications   Prior to Admission medications   Medication Sig Start Date End Date Taking? Authorizing Provider  DILANTIN 100 MG ER capsule TAKE 2 CAPSULES IN THE MORNING AND 3 CAPSULES IN THE EVENING 08/07/14  Yes [name], MD  DILANTIN INFATABS 50 MG tablet CHEW 1 TABLETS BY MOUTH EVERY MORNING FOR SEIZURE CONTROL Patient taking differently: Chew 50 mg by mouth at bedtime. CHEW 1 TABLETS BY MOUTH EVERY MORNING FOR SEIZURE CONTROL 09/14/14  Yes Ward Givens, NP  FLUoxetine (PROZAC) 20 MG tablet Take 1 tablet (20 mg total) by mouth daily. 03/06/15  Yes [name], MD  ibuprofen (ADVIL,MOTRIN) 200 MG tablet Take 400-600 mg by mouth every 6 (six) hours as needed for moderate pain.    Yes Historical Provider, MD  ranitidine (ZANTAC) 300 MG tablet Take 1 tablet (300 mg  total) by mouth at bedtime. 03/03/15  Yes [name], MD  sitaGLIPtin (JANUVIA) 50 MG tablet Take 1 tablet (50 mg total) by mouth daily. For control of blood sugar 03/03/15  Yes [name], MD  torsemide (DEMADEX) 20 MG tablet Take 1 tablet (20 mg total) by mouth daily.   Yes [name], MD  diclofenac (VOLTAREN) 75 MG EC tablet Take 1 tablet (75 mg total) by mouth 2 (two) times daily. Patient not taking: Reported on 05/15/2015 03/06/15   [name], MD   BP 127/88  mmHg  Pulse 73  Temp(Src) 97.5 F (36.4 C) (Oral)  Resp 18  SpO2 94%  LMP 04/22/2015 Physical Exam  Constitutional: She is oriented to person, place, and time. She appears well-developed and well-nourished. No distress.  HENT:  Head: Normocephalic and atraumatic.  Eyes: Conjunctivae and EOM are normal.  Neck: Normal range of motion.  Cardiovascular: Normal rate and regular rhythm.  Exam reveals no gallop and no friction rub.   No murmur heard. Pulmonary/Chest: Effort normal and breath sounds normal. She has no wheezes. She has no rales. She exhibits no tenderness.  Abdominal: Soft. She exhibits no distension. There is no tenderness. There is no rebound.  Musculoskeletal: Normal range of motion.  Neurological: She is alert and oriented to person, place, and time. Coordination normal.  Speech is goal-oriented. Moves limbs without ataxia.   Skin: Skin is warm and dry.  Psychiatric: She has a normal mood and affect. Her behavior is normal.  Nursing note and vitals reviewed.   ED Course  Procedures (including critical care time) Labs Review Labs Reviewed  CBC WITH DIFFERENTIAL/PLATELET - Abnormal; Notable for the following:    RBC 3.47 (*)    Hemoglobin 10.5 (*)    HCT 32.2 (*)    All other components within normal limits  BASIC METABOLIC PANEL - Abnormal; Notable for the following:    Glucose, Bld 218 (*)    Calcium 8.3 (*)    All other components within normal limits    Imaging Review Ct Head Wo Contrast  05/15/2015   CLINICAL DATA:  Headache for 2-3 days.  EXAM: CT HEAD WITHOUT CONTRAST  TECHNIQUE: Contiguous axial images were obtained from the base of the skull through the vertex without intravenous contrast.  COMPARISON:  09/20/2012  FINDINGS: No intracranial hemorrhage, mass effect, or midline shift. No hydrocephalus. The basilar cisterns are patent. No evidence of territorial infarct. No intracranial fluid collection. Calvarium is intact. Included paranasal sinuses are well  aerated. Minimal opacification of the lower mastoid air cells, left greater than right.  IMPRESSION: No acute intracranial abnormality.   Electronically Signed   By: Jeb Levering M.D.   On: 05/15/2015 04:20     EKG Interpretation None      MDM   Final diagnoses:  Nonintractable headache, unspecified chronicity pattern, unspecified headache type    4:38 AM Patient's CT unremarkable for acute changes. Patient will be treated with migraine cocktail.   6:00 AM Patient feeling better after migraine cocktail. Patient will be discharged without further evaluation. Vitals stable and patient afebrile.     Alvina Chou, PA-C 05/15/15 0601  Jola Schmidt, MD 05/15/15 [phone]

## 2015-05-15 NOTE — Telephone Encounter (Signed)
I called the patient. She had a migraine cocktail in the ED this morning but she states it has already worn off. She is only taking ibuprofen at home for her headaches.

## 2015-05-15 NOTE — ED Notes (Signed)
Family at bedside. 

## 2015-05-15 NOTE — ED Notes (Signed)
Patient transported to CT 

## 2015-05-15 NOTE — ED Notes (Signed)
Patient c/o headache x2-3 days. Patient states she has been taking ibuprofen, last dose on 05/13/2015. Patient states pain is sharp, and "comes and goes".

## 2015-05-15 NOTE — Telephone Encounter (Signed)
Patient called requesting to be seen asap stating her headaches are worse. She seen in ED on 05/14/15. Please call and advise. Patient can be reached at [phone].

## 2015-05-15 NOTE — Telephone Encounter (Signed)
I called patient. I left messages. Unable to reach the patient. We have not really treated her for headaches in the past, he was seen previously for seizures. I will try to get a revisit for her to determine how best to manage the headache issue.

## 2015-05-15 NOTE — ED Notes (Signed)
Blood draw attempt unsuccessful. RN notified. 

## 2015-05-15 NOTE — ED Notes (Signed)
Pt states she has been having sharp pain on the right side of her head for the past 3 days  Pt states the pain last a few seconds then lets up then comes again  Pt states [place] went to [place] last [place] but didn't stay

## 2015-05-15 NOTE — ED Notes (Signed)
Patient ambulated unassisted to restroom 

## 2015-05-15 NOTE — ED Notes (Signed)
PA at bedside. Aware of difficulty obtaining labs

## 2015-05-16 NOTE — Telephone Encounter (Signed)
Appt scheduled for 6/10.

## 2015-05-19 ENCOUNTER — Ambulatory Visit (INDEPENDENT_AMBULATORY_CARE_PROVIDER_SITE_OTHER): Payer: Commercial Managed Care - HMO | Admitting: Neurology

## 2015-05-19 ENCOUNTER — Encounter: Payer: Self-pay | Admitting: Neurology

## 2015-05-19 VITALS — BP 140/82 | HR 81 | Ht 61.0 in | Wt 299.0 lb

## 2015-05-19 DIAGNOSIS — G43019 Migraine without aura, intractable, without status migrainosus: Secondary | ICD-10-CM

## 2015-05-19 DIAGNOSIS — R569 Unspecified convulsions: Secondary | ICD-10-CM | POA: Diagnosis not present

## 2015-05-19 DIAGNOSIS — G4733 Obstructive sleep apnea (adult) (pediatric): Secondary | ICD-10-CM

## 2015-05-19 DIAGNOSIS — G43009 Migraine without aura, not intractable, without status migrainosus: Secondary | ICD-10-CM

## 2015-05-19 HISTORY — DX: Migraine without aura, not intractable, without status migrainosus: G43.009

## 2015-05-19 MED ORDER — RIZATRIPTAN BENZOATE 10 MG PO TBDP: 10.0000 mg | ORAL_TABLET | Freq: Three times a day (TID) | ORAL | Status: DC | PRN

## 2015-05-19 MED ORDER — TOPIRAMATE 25 MG PO TABS: ORAL_TABLET | ORAL | Status: DC

## 2015-05-19 NOTE — Progress Notes (Signed)
Reason for visit: Migraine headache  Brandi Gomez is an 44 y.o. female  History of present illness:  Brandi Gomez is a 44 year old right-handed black female with a history of intractable seizures. The patient had been on Dilantin and Topamax, but at some point between October 2015 and now, the patient stopped the Topamax. The patient has begun having headaches over the last week or 2. The patient went to the emergency room on 6 of June 2016 with a severe headache that had been present for 2-3 days. The patient indicates that 4 months prior to this, she had gone without any headaches. She will take Tylenol or Aleve for the headache. The headaches are often times on the right side the head, unassociated with visual changes or nausea or vomiting. The patient reports that she is having problems with racing thoughts that have been a problem for her over the last several weeks. The patient underwent a CT scan of brain in the emergency room that was unremarkable. The patient has done well with her seizures, she has not had a seizure in over 6 months. She does not operate a motor vehicle.  Past Medical History  Diagnosis Date  . Type II or unspecified type diabetes mellitus without mention of complication, not stated as uncontrolled   . Hypertension   . Sleep apnea   . Anxiety   . Depression   . Bipolar 1 disorder   . Seizures     intractable  . Obesity   . Mild mental retardation   . Partial complex seizure disorder with intractable epilepsy 05/12/2014  . Anemia   . Common migraine 05/19/2015    Past Surgical History  Procedure Laterality Date  . Nasal sinus surgery    . Myringotomy with tube placement    . Colonoscopy      2012-normal , Dr Sharlett Iles  . Esophagogastroduodenoscopy      normal-Dr Sharlett Iles 2012    Family History  Problem Relation Age of Onset  . Diabetes Mother     passed away from accidental death  . Mental illness Father   . Diabetes Daughter   . Cancer  Daughter     leukemia  . Cancer Maternal Aunt     ovarian ca    Social history:  reports that she has never smoked. She has never used smokeless tobacco. She reports that she does not drink alcohol or use illicit drugs.    Allergies  Allergen Reactions  . Amoxicillin Itching  . Hydrocodone Other (See Comments)    Depressed     Medications:  Prior to Admission medications   Medication Sig Start Date End Date Taking? Authorizing Provider  diclofenac (VOLTAREN) 75 MG EC tablet Take 1 tablet (75 mg total) by mouth 2 (two) times daily. 03/06/15  Yes [name], MD  DILANTIN 100 MG ER capsule TAKE 2 CAPSULES IN THE MORNING AND 3 CAPSULES IN THE EVENING 08/07/14  Yes [name], MD  DILANTIN INFATABS 50 MG tablet CHEW 1 TABLETS BY MOUTH EVERY MORNING FOR SEIZURE CONTROL Patient taking differently: Chew 50 mg by mouth at bedtime. CHEW 1 TABLETS BY MOUTH EVERY MORNING FOR SEIZURE CONTROL 09/14/14  Yes Ward Givens, NP  FLUoxetine (PROZAC) 20 MG tablet Take 1 tablet (20 mg total) by mouth daily. 03/06/15  Yes [name], MD  ibuprofen (ADVIL,MOTRIN) 200 MG tablet Take 400-600 mg by mouth every 6 (six) hours as needed for moderate pain.    Yes Historical Provider, MD  ranitidine (ZANTAC) 300 MG tablet Take 1 tablet (300 mg total) by mouth at bedtime. 03/03/15  Yes [name], MD  sitaGLIPtin (JANUVIA) 50 MG tablet Take 1 tablet (50 mg total) by mouth daily. For control of blood sugar 03/03/15  Yes [name], MD  torsemide (DEMADEX) 20 MG tablet Take 1 tablet (20 mg total) by mouth daily.   Yes [name], MD    ROS:  Out of a complete 14 system review of symptoms, the patient complains only of the following symptoms, and all other reviewed systems are negative.  Hearing loss Light sensitivity Heat intolerance Constipation Memory loss, headache, numbness Confusion  Blood pressure 140/82, pulse 81, height 5\' 1"  (1.549 m), weight 299 lb (135.626 kg), last  menstrual period 04/22/2015.  Physical Exam  General: The patient is alert and cooperative at the time of the examination. The patient is markedly obese.  Skin: 1+ edema at ankles is noted bilaterally.   Neurologic Exam  Mental status: The patient is alert and oriented x 3 at the time of the examination. The patient has apparent normal recent and remote memory, with an apparently normal attention span and concentration ability.   Cranial nerves: Facial symmetry is present. Speech is normal, no aphasia or dysarthria is noted. Extraocular movements are full. Visual fields are full.  Motor: The patient has good strength in all 4 extremities.  Sensory examination: Soft touch sensation is symmetric on the face, arms, and legs.  Coordination: The patient has good finger-nose-finger and heel-to-shin bilaterally.  Gait and station: The patient has a normal gait. Tandem gait is normal. Romberg is negative. No drift is seen.  Reflexes: Deep tendon reflexes are symmetric.   CT head 05/15/15:  IMPRESSION: No acute intracranial abnormality.  * CT scan images were reviewed online. I agree with the written report.    Assessment/Plan:  1. Intractable seizures  2. Migraine headache  The patient will be placed back on Topamax beginning at 25 mg twice daily for 2 weeks, then go to 50 mg twice daily. Maxalt will be given to take if needed for the headache. The patient will follow-up in about 4 months. She will contact our office if she is not doing well.  Jill Alexanders MD 05/19/2015 6:36 PM  [facility] [address] [place], [place] [address]  Phone [phone] Fax [phone]

## 2015-05-19 NOTE — Patient Instructions (Signed)

## 2015-06-20 ENCOUNTER — Ambulatory Visit: Payer: Medicare Other | Admitting: Neurology

## 2015-06-30 ENCOUNTER — Other Ambulatory Visit: Payer: Self-pay | Admitting: Neurology

## 2015-07-01 ENCOUNTER — Ambulatory Visit (INDEPENDENT_AMBULATORY_CARE_PROVIDER_SITE_OTHER): Payer: Commercial Managed Care - HMO | Admitting: Family Medicine

## 2015-07-01 VITALS — BP 116/82 | HR 66 | Temp 98.8°F | Resp 16 | Ht 61.0 in | Wt 296.2 lb

## 2015-07-01 DIAGNOSIS — R8290 Unspecified abnormal findings in urine: Secondary | ICD-10-CM | POA: Diagnosis not present

## 2015-07-01 DIAGNOSIS — E119 Type 2 diabetes mellitus without complications: Secondary | ICD-10-CM | POA: Diagnosis not present

## 2015-07-01 DIAGNOSIS — R300 Dysuria: Secondary | ICD-10-CM | POA: Diagnosis not present

## 2015-07-01 DIAGNOSIS — R21 Rash and other nonspecific skin eruption: Secondary | ICD-10-CM | POA: Diagnosis not present

## 2015-07-01 LAB — POCT UA - MICROSCOPIC ONLY
Casts, Ur, LPF, POC: NEGATIVE
Crystals, Ur, HPF, POC: NEGATIVE
Yeast, UA: NEGATIVE

## 2015-07-01 LAB — POCT URINALYSIS DIPSTICK
Bilirubin, UA: NEGATIVE
Blood, UA: NEGATIVE
Glucose, UA: NEGATIVE
Ketones, UA: NEGATIVE
Leukocytes, UA: NEGATIVE
Nitrite, UA: NEGATIVE
Spec Grav, UA: >=1.03
Urobilinogen, UA: 0.2
pH, UA: 6

## 2015-07-01 LAB — POCT GLYCOSYLATED HEMOGLOBIN (HGB A1C): Hemoglobin A1C: 7

## 2015-07-01 LAB — GLUCOSE, POCT (MANUAL RESULT ENTRY): POC Glucose: 130 mg/dl — AB (ref 70–99)

## 2015-07-01 MED ORDER — MUPIROCIN 2 % EX OINT: 1.0000 "application " | TOPICAL_OINTMENT | Freq: Two times a day (BID) | CUTANEOUS | Status: DC

## 2015-07-01 MED ORDER — GLUCOSE BLOOD VI STRP: ORAL_STRIP | Status: DC

## 2015-07-01 MED ORDER — KETOCONAZOLE 2 % EX CREA: 1.0000 "application " | TOPICAL_CREAM | Freq: Every day | CUTANEOUS | Status: DC

## 2015-07-01 MED ORDER — FREESTYLE SYSTEM KIT: 1.0000 | PACK | Status: DC | PRN

## 2015-07-01 MED ORDER — METRONIDAZOLE 500 MG PO TABS: 500.0000 mg | ORAL_TABLET | Freq: Two times a day (BID) | ORAL | Status: DC

## 2015-07-01 NOTE — Patient Instructions (Signed)
Please return if the odor does not go away by midweek. You appear to have more vaginal infection.  I'm giving you some Flagyl which he take twice a day and it should take care of the problem. Also giving you a cream for the skin infections.

## 2015-07-01 NOTE — Progress Notes (Addendum)
Subjective:    Patient ID: Brandi Gomez, female    DOB: 15-Dec-1970, 44 y.o.   MRN: [id number] This chart was scribed for [name], MD by Marti Sleigh, Medical Scribe. This patient was seen in Room 3 and the patient's care was started a 11:51 AM.  Chief Complaint  Patient presents with   Dysuria   Other    urine odor     HPI HPI Comments: Brandi Gomez is a 44 y.o. female who presents to [facility] complaining of abnormal urine smell for the last two weeks. Pt denies fever, or back pain.   Pt also states she had a boil at her panty line, which released some puss and blood.  Pt also states she needs a new blood sugar checking machine.    Review of Systems  Constitutional: Negative for fever and chills.  Gastrointestinal: Negative for nausea and abdominal pain.  Genitourinary: Negative for dysuria, frequency and flank pain.       Abnormal urine smell       Objective:   Physical Exam  Constitutional: She is oriented to person, place, and time. She appears well-developed and well-nourished. No distress.  HENT:  Head: Normocephalic and atraumatic.  Eyes: Pupils are equal, round, and reactive to light.  Neck: Neck supple.  Cardiovascular: Normal rate.   Pulmonary/Chest: Effort normal. No respiratory distress.  Musculoskeletal: Normal range of motion.  Neurological: She is alert and oriented to person, place, and time. Coordination normal.  Skin: Skin is warm and dry. She is not diaphoretic.  Psychiatric: She has a normal mood and affect. Her behavior is normal.  Nursing note and vitals reviewed.  This chart was scribed in my presence and reviewed by me personally.  Results for orders placed or performed in visit on 07/01/15  POCT UA - Microscopic Only  Result Value Ref Range   WBC, Ur, HPF, POC 0-2    RBC, urine, microscopic 0-1    Bacteria, U Microscopic 2+    Mucus, UA small    Epithelial cells, urine per micros 5-10    Crystals, Ur, HPF, POC negative     Casts, Ur, LPF, POC negative    Yeast, UA negative   POCT urinalysis dipstick  Result Value Ref Range   Color, UA yellow    Clarity, UA clear    Glucose, UA negative    Bilirubin, UA negative    Ketones, UA negative    Spec Grav, UA >=1.030    Blood, UA negative    pH, UA 6.0    Protein, UA trace    Urobilinogen, UA 0.2    Nitrite, UA negative    Leukocytes, UA Negative Negative  POCT glucose (manual entry)  Result Value Ref Range   POC Glucose 130 (A) 70 - 99 mg/dl  POCT glycosylated hemoglobin (Hb A1C)  Result Value Ref Range   Hemoglobin A1C 7.0         Assessment & Plan:    This chart was scribed in my presence and reviewed by me personally.    ICD-9-CM ICD-10-CM   1. Dysuria 788.1 R30.0 POCT UA - Microscopic Only     POCT urinalysis dipstick     metroNIDAZOLE (FLAGYL) 500 MG tablet  2. Abnormal urine odor 791.9 R82.90 POCT UA - Microscopic Only     POCT urinalysis dipstick     metroNIDAZOLE (FLAGYL) 500 MG tablet  3. Type 2 diabetes mellitus, controlled 250.00 E11.9 POCT glucose (manual entry)  POCT glycosylated hemoglobin (Hb A1C)     glucose monitoring kit (FREESTYLE) monitoring kit     glucose blood test strip  4. Rash and nonspecific skin eruption 782.1 R21 ketoconazole (NIZORAL) 2 % cream     mupirocin ointment (BACTROBAN) 2 %     Signed, [name], MD

## 2015-07-03 ENCOUNTER — Telehealth: Payer: Self-pay

## 2015-07-03 ENCOUNTER — Telehealth: Payer: Self-pay | Admitting: Neurology

## 2015-07-03 NOTE — Telephone Encounter (Signed)
Events noted

## 2015-07-03 NOTE — Telephone Encounter (Signed)
Brandi Gomez with [facility] called and wanted to let Dr. Jannifer Franklin know there was a medication error on filling the prescription Rx. DILANTIN 100 MG ER capsule. The patient noticed the error before taking any of the medication but they are filing an incident report and correcting the issue. No need to return call.

## 2015-09-20 ENCOUNTER — Other Ambulatory Visit: Payer: Self-pay

## 2015-09-20 ENCOUNTER — Ambulatory Visit: Payer: Commercial Managed Care - HMO | Admitting: Neurology

## 2015-09-20 MED ORDER — FREESTYLE SYSTEM KIT: PACK | Status: DC

## 2015-09-20 MED ORDER — GLUCOSE BLOOD VI STRP: ORAL_STRIP | Status: DC

## 2015-09-20 MED ORDER — LANCETS MISC: Status: DC

## 2015-09-24 ENCOUNTER — Ambulatory Visit: Payer: Commercial Managed Care - HMO

## 2015-10-04 NOTE — Telephone Encounter (Signed)
Error

## 2015-10-11 ENCOUNTER — Ambulatory Visit (INDEPENDENT_AMBULATORY_CARE_PROVIDER_SITE_OTHER): Payer: Commercial Managed Care - HMO | Admitting: Family Medicine

## 2015-10-11 VITALS — BP 134/68 | HR 89 | Temp 98.2°F | Resp 16 | Ht 61.0 in | Wt 296.6 lb

## 2015-10-11 DIAGNOSIS — H1089 Other conjunctivitis: Secondary | ICD-10-CM

## 2015-10-11 MED ORDER — TOBRAMYCIN-DEXAMETHASONE 0.3-0.1 % OP SUSP: 1.0000 [drp] | Freq: Two times a day (BID) | OPHTHALMIC | Status: DC

## 2015-10-11 NOTE — Patient Instructions (Signed)
Use the eyedrops twice a day for both eyes for the next 3 days. Then if the problem comes back continues it as needed

## 2015-10-11 NOTE — Progress Notes (Addendum)
This chart was scribed for [name], MD by Moises Blood, medical scribe at [facility].The patient was seen in exam room 9 and the patient's care was started at 1:39 PM.  Patient ID: Brandi Gomez MRN: [id number], DOB: 11/16/1971, 44 y.o. Date of Encounter: 10/11/2015  Primary Physician: [name], MD  Chief Complaint:  Chief Complaint  Patient presents with   Itchy eyes    Both eyes , x 2 weeks    Dry Eye    Usually red   Blurred Vision    HPI:  Brandi Gomez is a 44 y.o. female who presents to [facility] complaining of itchy and dry eyes with blurry vision for about 2 weeks now. She's been using allergy drops on her eyes because it feels "gummy". She's been rubbing her eyes to try to clean the "eye boogers" out of her eyes.   Past Medical History  Diagnosis Date   Type II or unspecified type diabetes mellitus without mention of complication, not stated as uncontrolled    Hypertension    Sleep apnea    Anxiety    Depression    Bipolar 1 disorder (HCC)    Seizures (HCC)     intractable   Obesity    Mild mental retardation    Partial complex seizure disorder with intractable epilepsy ([place]) 05/12/2014   Anemia    Common migraine 05/19/2015     Home Meds: Prior to Admission medications   Medication Sig Start Date End Date Taking? Authorizing Provider  diclofenac (VOLTAREN) 75 MG EC tablet Take 1 tablet (75 mg total) by mouth 2 (two) times daily. 03/06/15   [name], MD  DILANTIN 100 MG ER capsule TAKE 2 CAPSULES IN THE MORNING AND 3 CAPSULES IN THE EVENING 08/07/14   [name], MD  DILANTIN 100 MG ER capsule TAKE 2 CAPSULES IN THE MORNING AND 3 CAPSULES IN THE EVENING 06/30/15   [name], MD  DILANTIN INFATABS 50 MG tablet CHEW 1 TABLETS BY MOUTH EVERY MORNING FOR SEIZURE CONTROL Patient taking differently: Chew 50 mg by mouth at bedtime. CHEW 1 TABLETS BY MOUTH EVERY MORNING FOR  SEIZURE CONTROL 09/14/14   Ward Givens, NP  FLUoxetine (PROZAC) 20 MG tablet Take 1 tablet (20 mg total) by mouth daily. 03/06/15   [name], MD  glucose blood test strip Test blood sugar daily. Dx E11.9 09/20/15   [name], MD  glucose monitoring kit (FREESTYLE) monitoring kit Test blood sugar daily. Dx E11.9 09/20/15   [name], MD  ibuprofen (ADVIL,MOTRIN) 200 MG tablet Take 400-600 mg by mouth every 6 (six) hours as needed for moderate pain.     Historical Provider, MD  ketoconazole (NIZORAL) 2 % cream Apply 1 application topically daily. 07/01/15   [name], MD  Lancets MISC Test blood sugar daily. Dx E11.9 09/20/15   [name], MD  metroNIDAZOLE (FLAGYL) 500 MG tablet Take 1 tablet (500 mg total) by mouth 2 (two) times daily with a meal. DO NOT CONSUME ALCOHOL WHILE TAKING THIS MEDICATION. 07/01/15   [name], MD  mupirocin ointment (BACTROBAN) 2 % Place 1 application into the nose 2 (two) times daily. 07/01/15   [name], MD  ranitidine (ZANTAC) 300 MG tablet Take 1 tablet (300 mg total) by mouth at bedtime. 03/03/15   [name], MD  rizatriptan (MAXALT-MLT) 10 MG disintegrating tablet Take 1 tablet (10 mg total) by mouth 3 (three) times daily as  needed for migraine. Patient not taking: Reported on 07/01/2015 05/19/15   [name], MD  sitaGLIPtin (JANUVIA) 50 MG tablet Take 1 tablet (50 mg total) by mouth daily. For control of blood sugar 03/03/15   [name], MD  topiramate (TOPAMAX) 25 MG tablet 1 tablet twice a day for 2 weeks, then take 2 tablets twice a day 05/19/15   [name], MD  torsemide (DEMADEX) 20 MG tablet Take 1 tablet (20 mg total) by mouth daily.    [name], MD    Allergies:  Allergies  Allergen Reactions   Amoxicillin Itching   Hydrocodone Other (See Comments)    Depressed     Social History   Social History   Marital Status: Single    Spouse Name: N/A   Number of Children: 3    Years of Education: 79   Occupational History   disabled        Social History Main Topics   Smoking status: Never Smoker    Smokeless tobacco: Never Used   Alcohol Use: No   Drug Use: No   Sexual Activity: Not on file   Other Topics Concern   Not on file   Social History Narrative   Patient lives at home with daughter.    Patient has 3 children.    Patient is right handed.    Patient has a high school education.    Patient is on disability   Patient drinks 2 cups of caffeine daily.     Review of Systems: Constitutional: negative for fever, chills, night sweats, weight changes, or fatigue  HEENT: negative for hearing loss, congestion, rhinorrhea, ST, epistaxis, or sinus pressure; positive for eye redness, eye pain, blurry vision Cardiovascular: negative for chest pain or palpitations Respiratory: negative for hemoptysis, wheezing, shortness of breath, or cough Abdominal: negative for abdominal pain, nausea, vomiting, diarrhea, or constipation Dermatological: negative for rash Neurologic: negative for headache, dizziness, or syncope All other systems reviewed and are otherwise negative with the exception to those above and in the HPI.  Physical Exam: Blood pressure 134/68, pulse 89, temperature 98.2 F (36.8 C), temperature source Oral, resp. rate 16, height _0  (1.549 m), weight 296 lb 9.6 oz (134.537 kg), SpO2 97 %., Body mass index is 56.07 kg/(m^2). General: Well developed, well nourished, in no acute distress. Head: Normocephalic, atraumatic, nares are without discharge. Bilateral auditory canals clear, TM's are without perforation, pearly grey and translucent with reflective cone of light bilaterally. Oral cavity moist, posterior pharynx without exudate, erythema, peritonsillar abscess, or post nasal drip; conjunctivitis bilaterally with mild injection, swollen lids Neck: Supple. No thyromegaly. Full ROM. No lymphadenopathy. Lungs: Clear bilaterally to  auscultation without wheezes, rales, or rhonchi. Breathing is unlabored. Heart: RRR with S1 S2. No murmurs, rubs, or gallops appreciated. Extremities/Skin: Warm and dry. No clubbing or cyanosis. No edema. No rashes or suspicious lesions. Neuro: Alert and oriented X 3. Moves all extremities spontaneously. Gait is normal. CNII-XII grossly in tact. Psych:  Responds to questions appropriately with a normal affect.     ASSESSMENT AND PLAN:  44 y.o. year old female with  This chart was scribed in my presence and reviewed by me personally.    ICD-9-CM ICD-10-CM   1. Other conjunctivitis 372.39 H10.89 tobramycin-dexamethasone (TOBRADEX) ophthalmic solution     Signed, [name], MD   By signing my name below, I, Moises Blood, attest that this documentation has been prepared under the direction and in the presence of Synetta Shadow  Joseph Art, MD. Electronically Signed: Moises Blood, [place]. 10/11/2015 , 1:39 PM .  Signed, [name], MD 10/11/2015 1:39 PM

## 2015-10-17 ENCOUNTER — Telehealth: Payer: Self-pay | Admitting: Family Medicine

## 2015-10-17 NOTE — Telephone Encounter (Signed)
LEFT A MESSAGE FOR PATIENT TO RETURN CALL AND FIND OUT WHERE AND WHEN SHE WENT TO THE EYE DOCTOR LAST.  NEED TO UPDATE HER CHART.

## 2015-10-20 ENCOUNTER — Telehealth: Payer: Self-pay | Admitting: Family Medicine

## 2015-10-20 DIAGNOSIS — H109 Unspecified conjunctivitis: Secondary | ICD-10-CM

## 2015-10-20 NOTE — Telephone Encounter (Signed)
Pt stated that her conjunctivitis is not better so she called eye doctor and made appt, but she needed a referral.  Referral made.

## 2015-10-20 NOTE — Telephone Encounter (Signed)
Patient states that she needs a referral for an eye appointment that she has on 10/23/2015. She is going to [facility] and her appointment is at 1:15pm.  [phone]

## 2015-10-23 DIAGNOSIS — H04123 Dry eye syndrome of bilateral lacrimal glands: Secondary | ICD-10-CM | POA: Diagnosis not present

## 2015-10-25 ENCOUNTER — Other Ambulatory Visit: Payer: Self-pay | Admitting: Adult Health

## 2015-12-12 DIAGNOSIS — G4733 Obstructive sleep apnea (adult) (pediatric): Secondary | ICD-10-CM | POA: Diagnosis not present

## 2015-12-12 DIAGNOSIS — M545 Low back pain: Secondary | ICD-10-CM | POA: Diagnosis not present

## 2015-12-12 DIAGNOSIS — R569 Unspecified convulsions: Secondary | ICD-10-CM | POA: Diagnosis not present

## 2015-12-16 ENCOUNTER — Other Ambulatory Visit: Payer: Self-pay

## 2015-12-16 MED ORDER — PHENYTOIN SODIUM EXTENDED 100 MG PO CAPS: ORAL_CAPSULE | ORAL | Status: DC

## 2016-01-02 ENCOUNTER — Ambulatory Visit (INDEPENDENT_AMBULATORY_CARE_PROVIDER_SITE_OTHER): Payer: Commercial Managed Care - HMO | Admitting: Family Medicine

## 2016-01-02 VITALS — BP 116/74 | HR 87 | Temp 99.2°F | Resp 18 | Ht 60.5 in | Wt 284.2 lb

## 2016-01-02 DIAGNOSIS — G479 Sleep disorder, unspecified: Secondary | ICD-10-CM

## 2016-01-02 DIAGNOSIS — R350 Frequency of micturition: Secondary | ICD-10-CM | POA: Diagnosis not present

## 2016-01-02 DIAGNOSIS — IMO0001 Reserved for inherently not codable concepts without codable children: Secondary | ICD-10-CM

## 2016-01-02 LAB — POCT URINALYSIS DIP (MANUAL ENTRY)
Glucose, UA: NEGATIVE
Leukocytes, UA: NEGATIVE
Nitrite, UA: POSITIVE — AB
Protein Ur, POC: 30 — AB
Spec Grav, UA: 1.025
Urobilinogen, UA: 1
pH, UA: 5.5

## 2016-01-02 LAB — POC MICROSCOPIC URINALYSIS (UMFC): Mucus: ABSENT

## 2016-01-02 MED ORDER — CIPROFLOXACIN HCL 250 MG PO TABS: 250.0000 mg | ORAL_TABLET | Freq: Two times a day (BID) | ORAL | Status: DC

## 2016-01-02 MED ORDER — TORSEMIDE 20 MG PO TABS: 20.0000 mg | ORAL_TABLET | Freq: Every day | ORAL | Status: DC

## 2016-01-02 NOTE — Patient Instructions (Signed)

## 2016-01-02 NOTE — Progress Notes (Signed)
By signing my name below, I, Moises Blood, attest that this documentation has been prepared under the direction and in the presence of [name], MD. Electronically Signed: Moises Blood, [place]. 01/02/2016 , 6:30 PM .  Patient was seen in room 3 .   Patient ID: Brandi Gomez MRN: [id number], DOB: [birth year], 45 y.o. Date of Encounter: 01/02/2016  Primary Physician: [name], MD  Chief Complaint:  Chief Complaint  Patient presents with  . Urinary Frequency  . Urine odor  . Medication Refill    torsemide    HPI:  Brandi Gomez is a 45 y.o. female who presents to [facility] complaining of UTI symptoms. She notes having urinary frequency with "really yellow urine". She denies fever and back pain.   She also notes having some sleep paralysis. She could whisper but couldn't talk. She takes dilantin, prescribed by Dr. Jannifer Franklin. She's been having nightmares recently. She says that Dr. Jannifer Franklin will not see her because she owes $39.  Past Medical History  Diagnosis Date  . Type II or unspecified type diabetes mellitus without mention of complication, not stated as uncontrolled   . Hypertension   . Sleep apnea   . Anxiety   . Depression   . Bipolar 1 disorder ([place])   . Seizures ([place])     intractable  . Obesity   . Mild mental retardation   . Partial complex seizure disorder with intractable epilepsy ([place]) 05/12/2014  . Anemia   . Common migraine 05/19/2015     Home Meds: Prior to Admission medications   Medication Sig Start Date End Date Taking? Authorizing Provider  diclofenac (VOLTAREN) 75 MG EC tablet Take 1 tablet (75 mg total) by mouth 2 (two) times daily. Patient not taking: Reported on 10/11/2015 03/06/15   [name], MD  DILANTIN INFATABS 50 MG tablet CHEW 1 TABLETS BY MOUTH EVERY MORNING FOR SEIZURE CONTROL 09/14/14   Ward Givens, NP  DILANTIN INFATABS 50 MG tablet CHEW 1 TABLET BY MOUTH EVERY MORNING. FOR SEIZURE CONTROL  10/26/15   Ward Givens, NP  FLUoxetine (PROZAC) 20 MG tablet Take 1 tablet (20 mg total) by mouth daily. 03/06/15   [name], MD  glucose blood test strip Test blood sugar daily. Dx E11.9 09/20/15   [name], MD  glucose monitoring kit (FREESTYLE) monitoring kit Test blood sugar daily. Dx E11.9 09/20/15   [name], MD  ibuprofen (ADVIL,MOTRIN) 200 MG tablet Take 400-600 mg by mouth every 6 (six) hours as needed for moderate pain.     Historical Provider, MD  ketoconazole (NIZORAL) 2 % cream Apply 1 application topically daily. Patient not taking: Reported on 10/11/2015 07/01/15   [name], MD  Lancets MISC Test blood sugar daily. Dx E11.9 09/20/15   [name], MD  metroNIDAZOLE (FLAGYL) 500 MG tablet Take 1 tablet (500 mg total) by mouth 2 (two) times daily with a meal. DO NOT CONSUME ALCOHOL WHILE TAKING THIS MEDICATION. Patient not taking: Reported on 10/11/2015 07/01/15   [name], MD  mupirocin ointment (BACTROBAN) 2 % Place 1 application into the nose 2 (two) times daily. Patient not taking: Reported on 10/11/2015 07/01/15   [name], MD  phenytoin (DILANTIN) 100 MG ER capsule TAKE 2 CAPSULES IN THE MORNING AND 3 CAPSULES IN THE EVENING 12/16/15   [name], MD  ranitidine (ZANTAC) 300 MG tablet Take 1 tablet (300 mg total) by mouth at bedtime. Patient not taking: Reported on 10/11/2015 03/03/15  [name], MD  rizatriptan (MAXALT-MLT) 10 MG disintegrating tablet Take 1 tablet (10 mg total) by mouth 3 (three) times daily as needed for migraine. Patient not taking: Reported on 07/01/2015 05/19/15   [name], MD  sitaGLIPtin (JANUVIA) 50 MG tablet Take 1 tablet (50 mg total) by mouth daily. For control of blood sugar 03/03/15   [name], MD  tobramycin-dexamethasone [facility]) ophthalmic solution Place 1 drop into both eyes 2 (two) times daily. 10/11/15   [name], MD  topiramate (TOPAMAX) 25 MG tablet 1 tablet twice a day  for 2 weeks, then take 2 tablets twice a day Patient not taking: Reported on 10/11/2015 05/19/15   [name], MD  torsemide (DEMADEX) 20 MG tablet Take 1 tablet (20 mg total) by mouth daily. Patient not taking: Reported on 10/11/2015    [name], MD    Allergies:  Allergies  Allergen Reactions  . Amoxicillin Itching  . Hydrocodone Other (See Comments)    Depressed     Social History   Social History  . Marital Status: Single    Spouse Name: N/A  . Number of Children: 3  . Years of Education: 12   Occupational History  . disabled   .     Social History Main Topics  . Smoking status: Never Smoker   . Smokeless tobacco: Never Used  . Alcohol Use: No  . Drug Use: No  . Sexual Activity: Not on file   Other Topics Concern  . Not on file   Social History Narrative   Patient lives at home with daughter.    Patient has 3 children.    Patient is right handed.    Patient has a high school education.    Patient is on disability   Patient drinks 2 cups of caffeine daily.     Review of Systems: Constitutional: negative for fever, chills, night sweats, weight changes, or fatigue  HEENT: negative for vision changes, hearing loss, congestion, rhinorrhea, ST, epistaxis, or sinus pressure Cardiovascular: negative for chest pain or palpitations Respiratory: negative for hemoptysis, wheezing, shortness of breath, or cough Abdominal: negative for abdominal pain, nausea, vomiting, diarrhea, or constipation Dermatological: negative for rash Musc: negative for back pain Neurologic: negative for headache, dizziness, or syncope Psych: positive for sleep disturbance GU: positive for urinary frequency All other systems reviewed and are otherwise negative with the exception to those above and in the HPI.  Physical Exam: Blood pressure 116/74, pulse 87, temperature 99.2 F (37.3 C), temperature source Oral, resp. rate 18, height 5' 0.5" (1.537 m), weight 284 lb 3.2 oz (128.912  kg), SpO2 97 %., Body mass index is 54.57 kg/(m^2). General: Well developed, well nourished, in no acute distress. Head: Normocephalic, atraumatic, eyes without discharge, sclera non-icteric, nares are without discharge. Bilateral auditory canals clear, TM's are without perforation, pearly grey and translucent with reflective cone of light bilaterally. Oral cavity moist, posterior pharynx without exudate, erythema, peritonsillar abscess, or post nasal drip.  Neck: Supple. No thyromegaly. Full ROM. No lymphadenopathy. Lungs: Clear bilaterally to auscultation without wheezes, rales, or rhonchi. Breathing is unlabored. Heart: RRR with S1 S2. No murmurs, rubs, or gallops appreciated. Abdomen: Soft, non-tender, non-distended with normoactive bowel sounds. No hepatomegaly. No rebound/guarding. No obvious abdominal masses. Msk:  Strength and tone normal for age. Extremities/Skin: Warm and dry. No clubbing or cyanosis. No edema. No rashes or suspicious lesions. Neuro: Alert and oriented X 3. Moves all extremities spontaneously. Gait is normal. CNII-XII grossly in  tact. Psych:  Responds to questions appropriately with a normal affect.   Labs: Results for orders placed or performed in visit on 01/02/16  POCT urinalysis dipstick  Result Value Ref Range   Color, UA yellow yellow   Clarity, UA hazy (A) clear   Glucose, UA negative negative   Bilirubin, UA small (A) negative   Ketones, POC UA trace (5) (A) negative   Spec Grav, UA 1.025    Blood, UA trace-lysed (A) negative   pH, UA 5.5    Protein Ur, POC =30 (A) negative   Urobilinogen, UA 1.0    Nitrite, UA Positive (A) Negative   Leukocytes, UA Negative Negative  POCT Microscopic Urinalysis ([facility])  Result Value Ref Range   WBC,UR,HPF,POC Moderate (A) None WBC/hpf   RBC,UR,HPF,POC Few (A) None RBC/hpf   Bacteria Many (A) None, Too numerous to count   Mucus Absent Absent   Epithelial Cells, UR Per Microscopy Many (A) None, Too numerous to count  cells/hpf    ASSESSMENT AND PLAN:  45 y.o. year old female with  This chart was scribed in my presence and reviewed by me personally.    ICD-9-CM ICD-10-CM   1. Frequency 788.41 R35.0 POCT urinalysis dipstick     POCT Microscopic Urinalysis ([facility])     ciprofloxacin (CIPRO) 250 MG tablet  2. Sleep disorder 780.50 G47.9 Ambulatory referral to Neurology       Signed, [name], MD 01/02/2016 6:45 PM

## 2016-01-02 NOTE — Addendum Note (Signed)
Addended by: [name] on: 01/02/2016 06:50 PM   Modules accepted: Orders, SmartSet

## 2016-01-03 ENCOUNTER — Telehealth: Payer: Self-pay | Admitting: Neurology

## 2016-01-03 ENCOUNTER — Telehealth: Payer: Self-pay

## 2016-01-03 NOTE — Telephone Encounter (Signed)
Dr. Carlean Jews  Your Pt. Called in today wanting to tell you that her right foot has been shaking lately and she is worried about it. She wanted to know should she be worried, and do you want her to come in.

## 2016-01-03 NOTE — Telephone Encounter (Signed)
I called patient, left a message, I will call back later. 

## 2016-01-03 NOTE — Telephone Encounter (Signed)
Pt called and says her right foot started to "vibrate" today, 5 sec. Will go away and come back. Pt says the other night she was asleep but awake in her dream and could not speak out loud. She could whisper though. She says she is not sure if it was a dream or if she was really awake. No new medication, no recreational drugs. No alcohol with medication. May call at [phone]

## 2016-01-03 NOTE — Telephone Encounter (Signed)
I believe this will go away in 2 days

## 2016-01-03 NOTE — Telephone Encounter (Signed)
I called the patient. She had several episodes of vibration sensation of the right foot today, but it has stopped now, and she feels OK. She had an episode of sleep paralysis several days ago, this has not recurred. I have indicated that this is a benign event.

## 2016-01-03 NOTE — Telephone Encounter (Signed)
I spoke to patient, she states that she has an "on and off vibration" sensation in her R foot starting today. This is only a sensation, no movement in foot seen. Eran also reports that she woke up the other night and could only whisper, she was unable to mover her body. She states that she is using CPAP but according to download, she has only used it 2x in last 30 days.

## 2016-01-04 NOTE — Telephone Encounter (Signed)
Advised pt

## 2016-03-05 ENCOUNTER — Ambulatory Visit (INDEPENDENT_AMBULATORY_CARE_PROVIDER_SITE_OTHER): Payer: Commercial Managed Care - HMO | Admitting: Family Medicine

## 2016-03-05 VITALS — BP 138/66 | HR 65 | Temp 98.8°F | Resp 19 | Ht 60.5 in | Wt 286.2 lb

## 2016-03-05 DIAGNOSIS — N915 Oligomenorrhea, unspecified: Secondary | ICD-10-CM | POA: Diagnosis not present

## 2016-03-05 DIAGNOSIS — R140 Abdominal distension (gaseous): Secondary | ICD-10-CM

## 2016-03-05 LAB — POC MICROSCOPIC URINALYSIS (UMFC): Mucus: ABSENT

## 2016-03-05 LAB — POCT URINALYSIS DIP (MANUAL ENTRY)
Bilirubin, UA: NEGATIVE
Glucose, UA: NEGATIVE
Leukocytes, UA: NEGATIVE
Nitrite, UA: NEGATIVE
Protein Ur, POC: 30 — AB
Spec Grav, UA: 1.025
Urobilinogen, UA: 0.2
pH, UA: 5

## 2016-03-05 LAB — POCT URINE PREGNANCY: Preg Test, Ur: NEGATIVE

## 2016-03-05 NOTE — Progress Notes (Signed)
This 45 year old woman with diabetes who has developed epigastric fullness over the last couple weeks. She states that her last period was very light and she has concerns about being pregnant.  Patient notes some breast tenderness as well as the epigastric fullness. She's also having some lower abdominal discomfort.  Objective: Morbidly obese woman in no acute distress.vs BP 138/66 mmHg  Pulse 65  Temp(Src) 98.8 F (37.1 C) (Oral)  Resp 19  Ht 5' 0.5" (1.537 m)  Wt 286 lb 3.2 oz (129.819 kg)  BMI 54.95 kg/m2  SpO2 96%  LMP 01/24/2016 Patient does not appear to be in any significant discomfort   HEENT: Poor dental condition otherwise negative Chest: Clear Heart: Regular no murmur Abdomen: Soft with mild tenderness in the epigastrium Extremities: No edema  Results for orders placed or performed in visit on 03/05/16  POCT urinalysis dipstick  Result Value Ref Range   Color, UA yellow yellow   Clarity, UA cloudy (A) clear   Glucose, UA negative negative   Bilirubin, UA negative negative   Ketones, POC UA trace (5) (A) negative   Spec Grav, UA 1.025    Blood, UA large (A) negative   pH, UA 5.0    Protein Ur, POC =30 (A) negative   Urobilinogen, UA 0.2    Nitrite, UA Negative Negative   Leukocytes, UA Negative Negative  POCT Microscopic Urinalysis ([facility])  Result Value Ref Range   WBC,UR,HPF,POC Few (A) None WBC/hpf   RBC,UR,HPF,POC Too numerous to count  (A) None RBC/hpf   Bacteria Moderate (A) None, Too numerous to count   Mucus Absent Absent   Epithelial Cells, UR Per Microscopy Many (A) None, Too numerous to count cells/hpf  POCT urine pregnancy  Result Value Ref Range   Preg Test, Ur Negative Negative   Assessment: I believe that this epigastric discomfort is coming from her recent antibiotic use (she had 2 different ones simultaneously).  Plan: Patient given samples of probiotics and told to call me if she is not getting better.  Signed, Carola Frost.D.

## 2016-03-05 NOTE — Patient Instructions (Addendum)
Take the probiotics one daily 1103 not better in the next 3 or 4 days

## 2016-04-23 ENCOUNTER — Other Ambulatory Visit: Payer: Self-pay | Admitting: Neurology

## 2016-04-24 ENCOUNTER — Other Ambulatory Visit: Payer: Self-pay

## 2016-05-18 ENCOUNTER — Other Ambulatory Visit: Payer: Self-pay | Admitting: Neurology

## 2016-05-22 ENCOUNTER — Other Ambulatory Visit: Payer: Self-pay | Admitting: Neurology

## 2016-05-23 ENCOUNTER — Other Ambulatory Visit: Payer: Self-pay | Admitting: Adult Health

## 2016-07-09 ENCOUNTER — Ambulatory Visit (INDEPENDENT_AMBULATORY_CARE_PROVIDER_SITE_OTHER): Payer: Commercial Managed Care - HMO | Admitting: Neurology

## 2016-07-09 ENCOUNTER — Encounter: Payer: Self-pay | Admitting: Neurology

## 2016-07-09 VITALS — BP 117/75 | HR 88 | Ht 61.0 in | Wt 292.0 lb

## 2016-07-09 DIAGNOSIS — Z5181 Encounter for therapeutic drug level monitoring: Secondary | ICD-10-CM

## 2016-07-09 DIAGNOSIS — G43019 Migraine without aura, intractable, without status migrainosus: Secondary | ICD-10-CM | POA: Diagnosis not present

## 2016-07-09 DIAGNOSIS — R569 Unspecified convulsions: Secondary | ICD-10-CM | POA: Diagnosis not present

## 2016-07-09 MED ORDER — PHENYTOIN 50 MG PO CHEW: CHEWABLE_TABLET | ORAL | 3 refills | Status: DC

## 2016-07-09 NOTE — Progress Notes (Signed)
Reason for visit: Seizures  Brandi Gomez is an 45 y.o. female  History of present illness:  Brandi Gomez is a 45 year old right-handed black female with a history of obesity, diabetes, sleep apnea on CPAP, and a history of seizures. The patient has had problems with headaches, she was placed on Topamax, but she is not having very frequent headaches at this time. She has gone off of the Topamax. The patient reports some difficulty with sleeping, she may jerk and twitch at night with the sleep apnea, she does wear the CPAP. She has not had any seizures since last seen. She does report some daytime drowsiness. She returns for an evaluation.  Past Medical History:  Diagnosis Date  . Anemia   . Anxiety   . Bipolar 1 disorder ([place])   . Common migraine 05/19/2015  . Depression   . Hypertension   . Mild mental retardation   . Obesity   . Partial complex seizure disorder with intractable epilepsy ([place]) 05/12/2014  . Seizures ([place])    intractable  . Sleep apnea   . Type II or unspecified type diabetes mellitus without mention of complication, not stated as uncontrolled     Past Surgical History:  Procedure Laterality Date  . COLONOSCOPY     2012-normal , Dr Sharlett Iles  . ESOPHAGOGASTRODUODENOSCOPY     normal-Dr Patterson 2012  . MYRINGOTOMY WITH TUBE PLACEMENT    . NASAL SINUS SURGERY      Family History  Problem Relation Age of Onset  . Diabetes Mother     passed away from accidental death  . Mental illness Father   . Diabetes Daughter   . Cancer Daughter     leukemia  . Cancer Maternal Aunt     ovarian ca    Social history:  reports that she has never smoked. She has never used smokeless tobacco. She reports that she does not drink alcohol or use drugs.    Allergies  Allergen Reactions  . Amoxicillin Itching  . Hydrocodone Other (See Comments)    Depressed     Medications:  Prior to Admission medications   Medication Sig Start Date End Date Taking?  Authorizing Provider  DILANTIN 100 MG ER capsule TAKE 2 CAPSULES IN THE MORNING AND 3 CAPSULES IN THE EVENING 05/20/16  Yes [name], MD  phenytoin (DILANTIN INFATABS) 50 MG tablet CHEW 1 TABLET BY MOUTH EVERY MORNING. FOR SEIZURE CONTROL 07/09/16  Yes [name], MD  ranitidine (ZANTAC) 300 MG tablet Take 1 tablet (300 mg total) by mouth at bedtime. Patient taking differently: Take 300 mg by mouth 3 times/day as needed-between meals & bedtime.  03/03/15  Yes [name], MD  sitaGLIPtin (JANUVIA) 50 MG tablet Take 1 tablet (50 mg total) by mouth daily. For control of blood sugar 03/03/15  Yes [name], MD  torsemide (DEMADEX) 20 MG tablet Take 1 tablet (20 mg total) by mouth daily. 01/02/16  Yes [name], MD  glucose blood test strip Test blood sugar daily. Dx E11.9 Patient not taking: Reported on 07/09/2016 09/20/15   [name], MD  glucose monitoring kit (FREESTYLE) monitoring kit Test blood sugar daily. Dx E11.9 Patient not taking: Reported on 07/09/2016 09/20/15   [name], MD  ketoconazole (NIZORAL) 2 % cream Apply 1 application topically daily. Patient not taking: Reported on 07/09/2016 07/01/15   [name], MD  Lancets MISC Test blood sugar daily. Dx E11.9 Patient not taking: Reported on 07/09/2016 09/20/15  [name], MD  rizatriptan (MAXALT-MLT) 10 MG disintegrating tablet Take 1 tablet (10 mg total) by mouth 3 (three) times daily as needed for migraine. Patient not taking: Reported on 07/01/2015 05/19/15   [name], MD    ROS:  Out of a complete 14 system review of symptoms, the patient complains only of the following symptoms, and all other reviewed systems are negative.  Hearing loss Eye discharge, blurred vision Sleep apnea, daytime sleepiness Memory loss Agitation  Blood pressure 117/75, pulse 88, height '5\' 1"'$  (1.549 m), weight 292 lb (132.5 kg).  Physical Exam  General: The patient is alert and cooperative at the time  of the examination. The patient is markedly obese.  Skin: No significant peripheral edema is noted.   Neurologic Exam  Mental status: The patient is alert and oriented x 3 at the time of the examination. The patient has apparent normal recent and remote memory, with an apparently normal attention span and concentration ability.   Cranial nerves: Facial symmetry is present. Speech is normal, no aphasia or dysarthria is noted. Extraocular movements are full. Visual fields are full.  Motor: The patient has good strength in all 4 extremities.  Sensory examination: Soft touch sensation is symmetric on the face, arms, and legs.  Coordination: The patient has good finger-nose-finger and heel-to-shin bilaterally.  Gait and station: The patient has a normal gait. Tandem gait is slightly unsteady. Romberg is negative. No drift is seen.  Reflexes: Deep tendon reflexes are symmetric, but are depressed.   Assessment/Plan:  1. History of seizures  2. History of headache, well controlled  3. Sleep apnea on CPAP  The patient will continue the Dilantin for now. We will check blood levels today. A prescription was given for the 50 mg tablet of Dilantin. The patient will follow-up in one year, sooner if needed. If the headaches recur, the patient will contact our office.  Jill Alexanders MD 07/09/2016 12:23 PM  [facility] [address] [place], [place] [address]  Phone [phone] Fax [phone]

## 2016-07-10 ENCOUNTER — Telehealth: Payer: Self-pay | Admitting: Neurology

## 2016-07-10 LAB — COMPREHENSIVE METABOLIC PANEL WITH GFR
ALT: 20 IU/L (ref 0–32)
AST: 15 IU/L (ref 0–40)
Albumin/Globulin Ratio: 1.1 — ABNORMAL LOW (ref 1.2–2.2)
Albumin: 3.6 g/dL (ref 3.5–5.5)
Alkaline Phosphatase: 182 IU/L — ABNORMAL HIGH (ref 39–117)
BUN/Creatinine Ratio: 18 (ref 9–23)
BUN: 15 mg/dL (ref 6–24)
Bilirubin Total: <0.2 mg/dL (ref 0.0–1.2)
CO2: 24 mmol/L (ref 18–29)
Calcium: 8.5 mg/dL — ABNORMAL LOW (ref 8.7–10.2)
Chloride: 98 mmol/L (ref 96–106)
Creatinine, Ser: 0.83 mg/dL (ref 0.57–1.00)
GFR calc Af Amer: 97 mL/min/1.73
GFR calc non Af Amer: 84 mL/min/1.73
Globulin, Total: 3.2 g/dL (ref 1.5–4.5)
Glucose: 258 mg/dL — ABNORMAL HIGH (ref 65–99)
Potassium: 4.3 mmol/L (ref 3.5–5.2)
Sodium: 138 mmol/L (ref 134–144)
Total Protein: 6.8 g/dL (ref 6.0–8.5)

## 2016-07-10 LAB — CBC WITH DIFFERENTIAL/PLATELET
Basophils Absolute: 0 x10E3/uL (ref 0.0–0.2)
Basos: 0 %
EOS (ABSOLUTE): 0.2 x10E3/uL (ref 0.0–0.4)
Eos: 2 %
Hematocrit: 31.8 % — ABNORMAL LOW (ref 34.0–46.6)
Hemoglobin: 10.8 g/dL — ABNORMAL LOW (ref 11.1–15.9)
Immature Grans (Abs): 0 x10E3/uL (ref 0.0–0.1)
Immature Granulocytes: 0 %
Lymphocytes Absolute: 2.6 x10E3/uL (ref 0.7–3.1)
Lymphs: 35 %
MCH: 30.1 pg (ref 26.6–33.0)
MCHC: 34 g/dL (ref 31.5–35.7)
MCV: 89 fL (ref 79–97)
Monocytes Absolute: 0.5 x10E3/uL (ref 0.1–0.9)
Monocytes: 6 %
Neutrophils Absolute: 4.2 x10E3/uL (ref 1.4–7.0)
Neutrophils: 57 %
Platelets: 251 x10E3/uL (ref 150–379)
RBC: 3.59 x10E6/uL — ABNORMAL LOW (ref 3.77–5.28)
RDW: 14.3 % (ref 12.3–15.4)
WBC: 7.3 x10E3/uL (ref 3.4–10.8)

## 2016-07-10 LAB — PHENYTOIN LEVEL, TOTAL: Phenytoin (Dilantin), Serum: 23 ug/mL (ref 10.0–20.0)

## 2016-07-10 MED ORDER — PHENYTOIN 50 MG PO CHEW: CHEWABLE_TABLET | ORAL | Status: DC

## 2016-07-10 NOTE — Telephone Encounter (Signed)
I called the patient. Blood work showed an elevation in the blood sugar to around 260, not sure what her control has been recently, I will send blood work to her primary care physician. The alkaline phosphatase was slightly elevated, stable from a year ago. This may be related to the use of Dilantin. The calcium level is minimally low. Once again, this is stable from last year. There is a mild stable anemia, hemoglobin is 10.8. Dilantin level is elevated at 23.0. This is not a trough level, but I would like the patient to reduce the Dilantin slightly to taking one half of the 50 mg tablet in the morning rather than the full tablet. The patient seems to understand these instructions.

## 2016-09-06 ENCOUNTER — Telehealth: Payer: Self-pay | Admitting: Neurology

## 2016-09-06 NOTE — Telephone Encounter (Signed)
The patient is having some gum overgrowth on the Dilantin, she wants to come off the medication and go to something different. We will get her into the office in the next several weeks to initiate the transfer to another medication.

## 2016-09-06 NOTE — Telephone Encounter (Signed)
Spoke to pt and she has had gum problems for years now.  Was not aware of dilantin causing this problem.  Wanted to see about changing to something else.  Last seen 07-09-2016.  Nex apt in a yr.  Forwarded to Dr. Jannifer Franklin.

## 2016-09-06 NOTE — Telephone Encounter (Signed)
Pt called in asking to be taken off of phenytoin (DILANTIN INFATABS) 50 MG tablet due to gum issues and weight gain. Would like to know if there is something else she could take? Please call [phone]

## 2016-09-09 NOTE — Telephone Encounter (Signed)
Called and spoke to pt. Appt scheduled for Fri, 10/13 @ noon. Pt agreed to arrive @ 11:45.

## 2016-09-12 ENCOUNTER — Ambulatory Visit (INDEPENDENT_AMBULATORY_CARE_PROVIDER_SITE_OTHER): Payer: Commercial Managed Care - HMO | Admitting: Physician Assistant

## 2016-09-12 VITALS — BP 128/84 | HR 73 | Temp 99.1°F | Resp 16 | Ht 61.0 in | Wt 286.0 lb

## 2016-09-12 DIAGNOSIS — N3090 Cystitis, unspecified without hematuria: Secondary | ICD-10-CM | POA: Diagnosis not present

## 2016-09-12 DIAGNOSIS — R8290 Unspecified abnormal findings in urine: Secondary | ICD-10-CM | POA: Diagnosis not present

## 2016-09-12 DIAGNOSIS — Z23 Encounter for immunization: Secondary | ICD-10-CM

## 2016-09-12 LAB — POCT URINALYSIS DIP (MANUAL ENTRY)
BILIRUBIN UA: NEGATIVE
BILIRUBIN UA: NEGATIVE
Glucose, UA: NEGATIVE
LEUKOCYTES UA: NEGATIVE
NITRITE UA: POSITIVE — AB
PH UA: 5.5
Spec Grav, UA: 1.02
Urobilinogen, UA: 0.2

## 2016-09-12 LAB — POC MICROSCOPIC URINALYSIS (UMFC): MUCUS RE: ABSENT

## 2016-09-12 MED ORDER — NITROFURANTOIN MONOHYD MACRO 100 MG PO CAPS: 100.0000 mg | ORAL_CAPSULE | Freq: Two times a day (BID) | ORAL | 0 refills | Status: AC

## 2016-09-12 NOTE — Patient Instructions (Addendum)
IF you received an x-ray today, you will receive an invoice from [facility]. Please contact [facility] at [phone] with questions or concerns regarding your invoice.   IF you received labwork today, you will receive an invoice from [employer]. Please contact [facility] at [phone] with questions or concerns regarding your invoice.   Our billing staff will not be able to assist you with questions regarding bills from these companies.  You will be contacted with the lab results as soon as they are available. The fastest way to get your results is to activate your My Chart account. Instructions are located on the last page of this paperwork. If you have not heard from [place] regarding the results in 2 weeks, please contact this office.    Please hydrate well with 64 oz. Of water if not more.   Return if your symptoms do not improve. Urinary Tract Infection Urinary tract infections (UTIs) can develop anywhere along your urinary tract. Your urinary tract is your body's drainage system for removing wastes and extra water. Your urinary tract includes two kidneys, two ureters, a bladder, and a urethra. Your kidneys are a pair of bean-shaped organs. Each kidney is about the size of your fist. They are located below your ribs, one on each side of your spine. CAUSES Infections are caused by microbes, which are microscopic organisms, including fungi, viruses, and bacteria. These organisms are so small that they can only be seen through a microscope. Bacteria are the microbes that most commonly cause UTIs. SYMPTOMS  Symptoms of UTIs may vary by age and gender of the patient and by the location of the infection. Symptoms in young women typically include a frequent and intense urge to urinate and a painful, burning feeling in the bladder or urethra during urination. Older women and men are more likely to be tired, shaky, and weak and have muscle aches and  abdominal pain. A fever may mean the infection is in your kidneys. Other symptoms of a kidney infection include pain in your back or sides below the ribs, nausea, and vomiting. DIAGNOSIS To diagnose a UTI, your caregiver will ask you about your symptoms. Your caregiver will also ask you to provide a urine sample. The urine sample will be tested for bacteria and white blood cells. White blood cells are made by your body to help fight infection. TREATMENT  Typically, UTIs can be treated with medication. Because most UTIs are caused by a bacterial infection, they usually can be treated with the use of antibiotics. The choice of antibiotic and length of treatment depend on your symptoms and the type of bacteria causing your infection. HOME CARE INSTRUCTIONS  If you were prescribed antibiotics, take them exactly as your caregiver instructs you. Finish the medication even if you feel better after you have only taken some of the medication.  Drink enough water and fluids to keep your urine clear or pale yellow.  Avoid caffeine, tea, and carbonated beverages. They tend to irritate your bladder.  Empty your bladder often. Avoid holding urine for long periods of time.  Empty your bladder before and after sexual intercourse.  After a bowel movement, women should cleanse from front to back. Use each tissue only once. SEEK MEDICAL CARE IF:   You have back pain.  You develop a fever.  Your symptoms do not begin to resolve within 3 days. SEEK IMMEDIATE MEDICAL CARE IF:   You have severe back pain or lower abdominal pain.  You develop chills.  You have nausea or vomiting.  You have continued burning or discomfort with urination. MAKE SURE YOU:   Understand these instructions.  Will watch your condition.  Will get help right away if you are not doing well or get worse.   This information is not intended to replace advice given to you by your health care provider. Make sure you discuss any  questions you have with your health care provider.   Document Released: 09/04/2005 Document Revised: 08/16/2015 Document Reviewed: 01/03/2012 Elsevier Interactive Patient Education [employer].

## 2016-09-16 NOTE — Progress Notes (Signed)
[facility] [address] [place] [address] [phone]  Date:  09/12/2016   Name:  Brandi Gomez   DOB:  1971-05-06   MRN:  [id number]  PCP:  [name], MD    History of Present Illness:  Brandi Gomez is a 45 y.o. female patient who presents to [facility] for cc of urinary odor. Patient reports that she has 1 week of a malodorous odor with urination.  There is no hematuria, or dysuria.  She is urinating slightly more frequent.  She denies vaginal discharge or rash.  She has no fever, abdominal pain, or flank pain.   She is sexually active but not at this time.     Patient Active Problem List   Diagnosis Date Noted  . Common migraine 05/19/2015  . OSA (obstructive sleep apnea) 03/09/2013  . Encounter for therapeutic drug monitoring 10/16/2012  . Generalized anxiety disorder 06/23/2012  . Unspecified essential hypertension 07/27/2008  . HEADACHE 12/11/2007  . ANKLE EDEMA 05/08/2007  . OBESITY, NOS 02/05/2007  . DEPRESSION, MAJOR, RECURRENT 02/05/2007  . Convulsions ([place]) 02/05/2007  . APNEA, SLEEP 02/05/2007    Past Medical History:  Diagnosis Date  . Anemia   . Anxiety   . Bipolar 1 disorder ([place])   . Common migraine 05/19/2015  . Depression   . Hypertension   . Mild mental retardation   . Obesity   . Partial complex seizure disorder with intractable epilepsy ([place]) 05/12/2014  . Seizures ([place])    intractable  . Sleep apnea   . Type II or unspecified type diabetes mellitus without mention of complication, not stated as uncontrolled     Past Surgical History:  Procedure Laterality Date  . COLONOSCOPY     2012-normal , Dr Sharlett Iles  . ESOPHAGOGASTRODUODENOSCOPY     normal-Dr Patterson 2012  . MYRINGOTOMY WITH TUBE PLACEMENT    . NASAL SINUS SURGERY      Social History  Substance Use Topics  . Smoking status: Never Smoker  . Smokeless tobacco: Never Used  . Alcohol use No    Family History  Problem Relation Age of Onset   . Diabetes Mother     passed away from accidental death  . Mental illness Father   . Diabetes Daughter   . Cancer Daughter     leukemia  . Cancer Maternal Aunt     ovarian ca    Allergies  Allergen Reactions  . Amoxicillin Itching  . Hydrocodone Other (See Comments)    Depressed     Medication list has been reviewed and updated.  Current Outpatient Prescriptions on File Prior to Visit  Medication Sig Dispense Refill  . DILANTIN 100 MG ER capsule TAKE 2 CAPSULES IN THE MORNING AND 3 CAPSULES IN THE EVENING 450 capsule 2  . phenytoin (DILANTIN INFATABS) 50 MG tablet CHEW 1/2 TABLET BY MOUTH EVERY MORNING. FOR SEIZURE CONTROL    . sitaGLIPtin (JANUVIA) 50 MG tablet Take 1 tablet (50 mg total) by mouth daily. For control of blood sugar 90 tablet 1  . torsemide (DEMADEX) 20 MG tablet Take 1 tablet (20 mg total) by mouth daily. 30 tablet 5  . glucose blood test strip Test blood sugar daily. Dx E11.9 (Patient not taking: Reported on 09/12/2016) 100 each 3  . glucose monitoring kit (FREESTYLE) monitoring kit Test blood sugar daily. Dx E11.9 (Patient not taking: Reported on 09/12/2016) 1 each 0  . ketoconazole (NIZORAL) 2 % cream Apply 1 application topically  daily. (Patient not taking: Reported on 09/12/2016) 15 g 0  . Lancets MISC Test blood sugar daily. Dx E11.9 (Patient not taking: Reported on 09/12/2016) 100 each 2  . ranitidine (ZANTAC) 300 MG tablet Take 1 tablet (300 mg total) by mouth at bedtime. (Patient not taking: Reported on 09/12/2016) 90 tablet 3  . rizatriptan (MAXALT-MLT) 10 MG disintegrating tablet Take 1 tablet (10 mg total) by mouth 3 (three) times daily as needed for migraine. (Patient not taking: Reported on 09/12/2016) 12 tablet 3   No current facility-administered medications on file prior to visit.     ROS ROS otherwise unremarkable unless listed above.   Physical Examination: BP 128/84 (BP Location: Right Arm, Patient Position: Sitting, Cuff Size: Large)   Pulse  73   Temp 99.1 F (37.3 C)   Resp 16   Ht _0  (1.549 m)   Wt 286 lb (129.7 kg)   LMP 09/05/2016 (Approximate)   SpO2 97%   BMI 54.04 kg/m  Ideal Body Weight: Weight in (lb) to have BMI = 25: 132  Physical Exam  Constitutional: She is oriented to person, place, and time. She appears well-developed and well-nourished. No distress.  HENT:  Head: Normocephalic and atraumatic.  Right Ear: External ear normal.  Left Ear: External ear normal.  Eyes: Conjunctivae and EOM are normal. Pupils are equal, round, and reactive to light.  Cardiovascular: Normal rate and regular rhythm.  Exam reveals no friction rub.   No murmur heard. Pulmonary/Chest: Effort normal. No respiratory distress. She has no wheezes.  Abdominal: Soft. Bowel sounds are normal. She exhibits no distension. There is no tenderness. There is no guarding.  Neurological: She is alert and oriented to person, place, and time.  Skin: She is not diaphoretic.  Psychiatric: She has a normal mood and affect. Her behavior is normal.     Assessment and Plan: Brandi Gomez is a 45 y.o. female who is here today for urinary odor. uti--treat with macrobid.  rtc as needed.   Flu vaccine given.   Cystitis - Plan: nitrofurantoin, macrocrystal-monohydrate, (MACROBID) 100 MG capsule  Need for influenza vaccination - Plan: Flu Vaccine QUAD 36+ mos IM  Abnormal urine odor - Plan: POCT Microscopic Urinalysis ([facility]), POCT urinalysis dipstick, nitrofurantoin, macrocrystal-monohydrate, (MACROBID) 100 MG capsule  Ivar Drape, PA-C [facility] 10/9/20175:16 PM

## 2016-09-20 ENCOUNTER — Encounter: Payer: Self-pay | Admitting: Neurology

## 2016-09-20 ENCOUNTER — Ambulatory Visit (INDEPENDENT_AMBULATORY_CARE_PROVIDER_SITE_OTHER): Payer: Commercial Managed Care - HMO | Admitting: Neurology

## 2016-09-20 VITALS — BP 144/87 | HR 76 | Ht 61.0 in | Wt 287.5 lb

## 2016-09-20 DIAGNOSIS — G43019 Migraine without aura, intractable, without status migrainosus: Secondary | ICD-10-CM

## 2016-09-20 DIAGNOSIS — R569 Unspecified convulsions: Secondary | ICD-10-CM

## 2016-09-20 MED ORDER — LEVETIRACETAM 500 MG PO TABS: ORAL_TABLET | ORAL | 1 refills | Status: DC

## 2016-09-20 NOTE — Patient Instructions (Addendum)
   With the Dilantin, stop the 50 mg tablet, but continue the capsules for now.   With the Keppra 500 mg tablet take 1/2 tablet twice a day for 2 weeks, then go to one tablet twice a day.   I will call in 3 weeks, and we will start the taper of the Dilantin.  Watch out for drowsiness or irritability on the Keppra.

## 2016-09-20 NOTE — Progress Notes (Signed)
Reason for visit: Seizures  Brandi Gomez is an 45 y.o. female  History of present illness:  Brandi Gomez is a 45 year old right-handed black female with a history of seizures. The last seizure was sometime around December 2015, she has done quite well on Dilantin. She has done well with her headaches and she has been able to come off of Topamax. She has developed some gum hyperplasia, and she wishes come off of Dilantin because of this side effect. She returns for an evaluation. She has not had any seizures since last seen.  Past Medical History:  Diagnosis Date  . Anemia   . Anxiety   . Bipolar 1 disorder ([place])   . Common migraine 05/19/2015  . Depression   . Hypertension   . Mild mental retardation   . Obesity   . Partial complex seizure disorder with intractable epilepsy ([place]) 05/12/2014  . Seizures ([place])    intractable  . Sleep apnea   . Type II or unspecified type diabetes mellitus without mention of complication, not stated as uncontrolled     Past Surgical History:  Procedure Laterality Date  . COLONOSCOPY     2012-normal , Dr Sharlett Iles  . ESOPHAGOGASTRODUODENOSCOPY     normal-Dr Patterson 2012  . MYRINGOTOMY WITH TUBE PLACEMENT    . NASAL SINUS SURGERY      Family History  Problem Relation Age of Onset  . Diabetes Mother     passed away from accidental death  . Mental illness Father   . Diabetes Daughter   . Cancer Daughter     leukemia  . Cancer Maternal Aunt     ovarian ca    Social history:  reports that she has never smoked. She has never used smokeless tobacco. She reports that she does not drink alcohol or use drugs.    Allergies  Allergen Reactions  . Amoxicillin Itching  . Hydrocodone Other (See Comments)    Depressed     Medications:  Prior to Admission medications   Medication Sig Start Date End Date Taking? Authorizing Provider  DILANTIN 100 MG ER capsule TAKE 2 CAPSULES IN THE MORNING AND 3 CAPSULES IN THE EVENING 05/20/16  Yes  [name], MD  phenytoin (DILANTIN INFATABS) 50 MG tablet CHEW 1/2 TABLET BY MOUTH EVERY MORNING. FOR SEIZURE CONTROL 07/10/16  Yes [name], MD  sitaGLIPtin (JANUVIA) 50 MG tablet Take 1 tablet (50 mg total) by mouth daily. For control of blood sugar 03/03/15  Yes [name], MD  torsemide (DEMADEX) 20 MG tablet Take 1 tablet (20 mg total) by mouth daily. 01/02/16  Yes [name], MD  glucose blood test strip Test blood sugar daily. Dx E11.9 Patient not taking: Reported on 09/20/2016 09/20/15   [name], MD  glucose monitoring kit (FREESTYLE) monitoring kit Test blood sugar daily. Dx E11.9 Patient not taking: Reported on 09/20/2016 09/20/15   [name], MD  ketoconazole (NIZORAL) 2 % cream Apply 1 application topically daily. Patient not taking: Reported on 09/20/2016 07/01/15   [name], MD  Lancets MISC Test blood sugar daily. Dx E11.9 Patient not taking: Reported on 09/20/2016 09/20/15   [name], MD  rizatriptan (MAXALT-MLT) 10 MG disintegrating tablet Take 1 tablet (10 mg total) by mouth 3 (three) times daily as needed for migraine. Patient not taking: Reported on 09/20/2016 05/19/15   [name], MD    ROS:  Out of a complete 14 system review of symptoms, the patient complains only  of the following symptoms, and all other reviewed systems are negative.  Hearing loss Cold and heat intolerance Sleep apnea Muscle cramps Memory loss  Blood pressure (!) 144/87, pulse 76, height '5\' 1"'$  (1.549 m), weight 287 lb 8 oz (130.4 kg), last menstrual period 09/05/2016.  Physical Exam  General: The patient is alert and cooperative at the time of the examination. The patient is markedly obese.  Skin: No significant peripheral edema is noted.   Neurologic Exam  Mental status: The patient is alert and oriented x 3 at the time of the examination. The patient has apparent normal recent and remote memory, with an apparently normal attention  span and concentration ability.   Cranial nerves: Facial symmetry is present. Speech is normal, no aphasia or dysarthria is noted. Extraocular movements are full. Visual fields are full.  Motor: The patient has good strength in all 4 extremities.  Sensory examination: Soft touch sensation is symmetric on the face, arms, and legs.  Coordination: The patient has good finger-nose-finger and heel-to-shin bilaterally.  Gait and station: The patient has a normal gait. Tandem gait is slightly unsteady. Romberg is negative. No drift is seen.  Reflexes: Deep tendon reflexes are symmetric, but are depressed.   Assessment/Plan:  1. History of seizures  2. Gum hyperplasia  The patient will the placed on Keppra, we will work up to a 500 mg dose over the next 2 weeks, I will call her in 3 weeks and we will initiate a taper off of the Dilantin. She will stop the 50 mg tablet of Dilantin now. We will eventually work her up to a 750 mg twice daily dose of Keppra. She is to watch out for drowsiness or irritability.  Jill Alexanders MD 09/20/2016 12:40 PM  [facility] [address] [place], [place] [address]  Phone [phone] Fax [phone]

## 2016-10-10 ENCOUNTER — Telehealth: Payer: Self-pay | Admitting: Neurology

## 2016-10-10 MED ORDER — LEVETIRACETAM 750 MG PO TABS: 750.0000 mg | ORAL_TABLET | Freq: Two times a day (BID) | ORAL | 3 refills | Status: DC

## 2016-10-10 NOTE — Telephone Encounter (Signed)
I called patient. The patient has been on Dilantin and Keppra. She missed a dose of Keppra yesterday, had a seizure. The patient otherwise is tolerating the [place] well.  The plans are to increase the Keppra to 750 mg twice daily. The patient then will initiate a taper off of the Dilantin by one capsule every 2 weeks until she is off the medication. If she is having seizures, she is to contact our office.

## 2016-10-11 ENCOUNTER — Other Ambulatory Visit: Payer: Self-pay

## 2016-10-11 MED ORDER — LEVETIRACETAM 750 MG PO TABS: 750.0000 mg | ORAL_TABLET | Freq: Two times a day (BID) | ORAL | 3 refills | Status: DC

## 2016-10-11 NOTE — Telephone Encounter (Signed)
90 day rx e-scribed per faxed request from pharmacy. 

## 2016-11-05 DIAGNOSIS — R69 Illness, unspecified: Secondary | ICD-10-CM | POA: Diagnosis not present

## 2016-11-11 ENCOUNTER — Telehealth: Payer: Self-pay

## 2016-11-11 ENCOUNTER — Ambulatory Visit (INDEPENDENT_AMBULATORY_CARE_PROVIDER_SITE_OTHER): Payer: Commercial Managed Care - HMO | Admitting: Physician Assistant

## 2016-11-11 VITALS — BP 134/86 | HR 73 | Temp 98.6°F | Resp 17 | Ht 61.0 in | Wt 283.0 lb

## 2016-11-11 DIAGNOSIS — E119 Type 2 diabetes mellitus without complications: Secondary | ICD-10-CM | POA: Diagnosis not present

## 2016-11-11 DIAGNOSIS — R748 Abnormal levels of other serum enzymes: Secondary | ICD-10-CM

## 2016-11-11 DIAGNOSIS — N912 Amenorrhea, unspecified: Secondary | ICD-10-CM

## 2016-11-11 DIAGNOSIS — L298 Other pruritus: Secondary | ICD-10-CM | POA: Diagnosis not present

## 2016-11-11 DIAGNOSIS — B3731 Acute candidiasis of vulva and vagina: Secondary | ICD-10-CM

## 2016-11-11 DIAGNOSIS — B373 Candidiasis of vulva and vagina: Secondary | ICD-10-CM | POA: Diagnosis not present

## 2016-11-11 DIAGNOSIS — I10 Essential (primary) hypertension: Secondary | ICD-10-CM

## 2016-11-11 DIAGNOSIS — N898 Other specified noninflammatory disorders of vagina: Secondary | ICD-10-CM

## 2016-11-11 LAB — POCT WET + KOH PREP: Trich by wet prep: ABSENT

## 2016-11-11 LAB — POCT URINE PREGNANCY: Preg Test, Ur: NEGATIVE

## 2016-11-11 MED ORDER — TORSEMIDE 20 MG PO TABS: 20.0000 mg | ORAL_TABLET | Freq: Every day | ORAL | 1 refills | Status: DC

## 2016-11-11 MED ORDER — SITAGLIPTIN PHOSPHATE 50 MG PO TABS: 50.0000 mg | ORAL_TABLET | Freq: Every day | ORAL | 1 refills | Status: DC

## 2016-11-11 MED ORDER — FLUCONAZOLE 150 MG PO TABS: 150.0000 mg | ORAL_TABLET | Freq: Once | ORAL | 0 refills | Status: AC

## 2016-11-11 NOTE — Progress Notes (Signed)
Patient ID: Brandi Gomez, female    DOB: [birth year], 45 y.o.   MRN: [id number]  PCP: Brandi Gomez, who has retired. She plans to follow-up with Brandi Gomez going forward.  Chief Complaint  Patient presents with  . Vaginal Itching  . Medication Refill    januvia,demadex    Subjective:   Presents for evaluation of vaginal itching. She is accompanied by her husband.  "Pulling, itching and fullness" in the vagina x 2 days. Some some atypical discharge. Started after applying Green Grease (hair grease) as a vaginal lubricant, because she had run out of Vaseline.  No pelvic or back pain. No nausea/vomiting or diarrhea. No dyspareunia. No fever/chills. No urinary urgency, frequency or burning. No contraception. "I figured I can't get pregnant." Has regular menses.  Currently on antibiotic for recent oral surgery, almost completed.  Also needs refills of Januvia and Demadex.   Review of Systems As above.    Patient Active Problem List   Diagnosis Date Noted  . Common migraine 05/19/2015  . OSA (obstructive sleep apnea) 03/09/2013  . Encounter for therapeutic drug monitoring 10/16/2012  . Generalized anxiety disorder 06/23/2012  . Unspecified essential hypertension 07/27/2008  . HEADACHE 12/11/2007  . ANKLE EDEMA 05/08/2007  . OBESITY, NOS 02/05/2007  . DEPRESSION, MAJOR, RECURRENT 02/05/2007  . Convulsions ([place]) 02/05/2007  . APNEA, SLEEP 02/05/2007     Prior to Admission medications   Medication Sig Start Date End Date Taking? Authorizing Provider  DILANTIN 100 MG ER capsule TAKE 2 CAPSULES IN THE MORNING AND 3 CAPSULES IN THE EVENING 05/20/16  Yes [name], MD  oxycodone-acetaminophen (PERCOCET) 2.5-325 MG tablet Take 1 tablet by mouth every 4 (four) hours as needed for pain.   Yes Historical Provider, MD  penicillin v potassium (VEETID) 500 MG tablet Take 500 mg by mouth 4 (four) times daily.   Yes Historical Provider, MD  rizatriptan (MAXALT-MLT) 10  MG disintegrating tablet Take 1 tablet (10 mg total) by mouth 3 (three) times daily as needed for migraine. 05/19/15  Yes [name], MD  sitaGLIPtin (JANUVIA) 50 MG tablet Take 1 tablet (50 mg total) by mouth daily. For control of blood sugar 03/03/15  Yes [name], MD  torsemide (DEMADEX) 20 MG tablet Take 1 tablet (20 mg total) by mouth daily. 01/02/16  Yes [name], MD  chlorhexidine (PERIDEX) 0.12 % solution  11/05/16   Historical Provider, MD  glucose blood test strip Test blood sugar daily. Dx E11.9 Patient not taking: Reported on 11/11/2016 09/20/15   [name], MD  glucose monitoring kit (FREESTYLE) monitoring kit Test blood sugar daily. Dx E11.9 Patient not taking: Reported on 11/11/2016 09/20/15   [name], MD  ibuprofen (ADVIL,MOTRIN) 600 MG tablet  11/05/16   Historical Provider, MD  ketoconazole (NIZORAL) 2 % cream Apply 1 application topically daily. Patient not taking: Reported on 11/11/2016 07/01/15   [name], MD  Lancets MISC Test blood sugar daily. Dx E11.9 Patient not taking: Reported on 11/11/2016 09/20/15   [name], MD  oxyCODONE-acetaminophen (PERCOCET/ROXICET) 5-325 MG tablet  11/05/16   Historical Provider, MD     Allergies  Allergen Reactions  . Amoxicillin Itching  . Hydrocodone Other (See Comments)    Depressed        Objective:  Physical Exam  Constitutional: She is oriented to person, place, and time. She appears well-developed and well-nourished. She is active and cooperative. No distress.  BP 134/86 (BP Location: Right Arm, Patient Position: Sitting, Cuff  Size: Large)   Pulse 73   Temp 98.6 F (37 C) (Oral)   Resp 17   Ht '5\' 1"'$  (1.549 m)   Wt 283 lb (128.4 kg)   SpO2 96%   BMI 53.47 kg/m   HENT:  Head: Normocephalic and atraumatic.  Right Ear: Hearing normal.  Left Ear: Hearing normal.  Eyes: Conjunctivae are normal. No scleral icterus.  Neck: Normal range of motion. Neck supple. No thyromegaly  present.  Cardiovascular: Normal rate, regular rhythm and normal heart sounds.   Pulses:      Radial pulses are 2+ on the right side, and 2+ on the left side.  Pulmonary/Chest: Effort normal and breath sounds normal.  Abdominal: Hernia confirmed negative in the right inguinal area and confirmed negative in the left inguinal area.  Genitourinary: Uterus normal. Pelvic exam was performed with patient supine. No labial fusion. There is no rash, tenderness, lesion or injury on the right labia. There is no rash, tenderness, lesion or injury on the left labia. Cervix exhibits motion tenderness (mild). Cervix exhibits no discharge and no friability. Right adnexum displays no mass, no tenderness and no fullness. Left adnexum displays no mass, no tenderness and no fullness. No erythema, tenderness or bleeding in the vagina. No foreign body in the vagina. No signs of injury around the vagina. Vaginal discharge found.  Lymphadenopathy:       Head (right side): No tonsillar, no preauricular, no posterior auricular and no occipital adenopathy present.       Head (left side): No tonsillar, no preauricular, no posterior auricular and no occipital adenopathy present.    She has no cervical adenopathy.       Right: No inguinal and no supraclavicular adenopathy present.       Left: No inguinal and no supraclavicular adenopathy present.  Neurological: She is alert and oriented to person, place, and time. No sensory deficit.  Skin: Skin is warm, dry and intact. No rash noted. No cyanosis or erythema. Nails show no clubbing.  Psychiatric: She has a normal mood and affect. Her speech is normal and behavior is normal.      Results for orders placed or performed in visit on 11/11/16  POCT urine pregnancy  Result Value Ref Range   Preg Test, Ur Negative Negative  POCT Wet + KOH Prep  Result Value Ref Range   Yeast by KOH Present (A) Absent   Yeast by wet prep Present (A) Absent   WBC by wet prep None (A) Few    Clue Cells Wet Prep HPF POC None None   Trich by wet prep Absent Absent   Bacteria Wet Prep HPF POC None (A) Few   Epithelial Cells By [employer] Pref ([facility]) Few None, Few, Too numerous to count   RBC,UR,HPF,POC None None RBC/hpf        Assessment & Plan:   1. Vaginal itching - POCT Wet + KOH Prep  2. Controlled type 2 diabetes mellitus without complication, without long-term current use of insulin ([place]) Await lab results. Will need routine follow-up with new PCP. Will need to consider: ASA, statin, ACEI, metformin. - CBC with Differential/Platelet - Comprehensive metabolic panel - Hemoglobin A1c - Microalbumin, urine - sitaGLIPtin (JANUVIA) 50 MG tablet; Take 1 tablet (50 mg total) by mouth daily. For control of blood sugar  Dispense: 90 tablet; Refill: 1  3. Essential hypertension Controlled. - torsemide (DEMADEX) 20 MG tablet; Take 1 tablet (20 mg total) by mouth daily.  Dispense: 90 tablet; Refill:  1  4. Amenorrhea Negative UCG today. Suspect PCOS. - POCT urine pregnancy  5. Vaginal yeast infection - fluconazole (DIFLUCAN) 150 MG tablet; Take 1 tablet (150 mg total) by mouth once. Repeat if needed  Dispense: 2 tablet; Refill: 0   Fara Chute, PA-C Physician Assistant-Certified [facility]

## 2016-11-11 NOTE — Telephone Encounter (Signed)
[facility] pharmacy is calling about an interaction with [facility]   Please call [phone]

## 2016-11-11 NOTE — Progress Notes (Signed)
[facility]  Aware ok to fill for 1 dose. Pharmacy advised and they will call pt. When ready.

## 2016-11-11 NOTE — Patient Instructions (Addendum)
Use a product designed for vaginal lubrication as needed.    IF you received an x-ray today, you will receive an invoice from [facility]. Please contact [facility] at [phone] with questions or concerns regarding your invoice.   IF you received labwork today, you will receive an invoice from [employer]. Please contact [facility] at [phone] with questions or concerns regarding your invoice.   Our billing staff will not be able to assist you with questions regarding bills from these companies.  You will be contacted with the lab results as soon as they are available. The fastest way to get your results is to activate your My Chart account. Instructions are located on the last page of this paperwork. If you have not heard from [place] regarding the results in 2 weeks, please contact this office.

## 2016-11-11 NOTE — Telephone Encounter (Signed)
[facility]le  Aware ok to fill for 1 dose. Pharmacy advised and they will call pt. When ready. (dilatin and diflucan)

## 2016-11-12 LAB — HEMOGLOBIN A1C
ESTIMATED AVERAGE GLUCOSE: 160 mg/dL
Hgb A1c MFr Bld: 7.2 % — ABNORMAL HIGH (ref 4.8–5.6)

## 2016-11-12 LAB — COMPREHENSIVE METABOLIC PANEL
ALT: 18 IU/L (ref 0–32)
AST: 14 IU/L (ref 0–40)
Albumin/Globulin Ratio: 1.1 — ABNORMAL LOW (ref 1.2–2.2)
Albumin: 4 g/dL (ref 3.5–5.5)
Alkaline Phosphatase: 188 IU/L — ABNORMAL HIGH (ref 39–117)
BUN/Creatinine Ratio: 19 (ref 9–23)
BUN: 13 mg/dL (ref 6–24)
CALCIUM: 8.8 mg/dL (ref 8.7–10.2)
CHLORIDE: 98 mmol/L (ref 96–106)
CO2: 26 mmol/L (ref 18–29)
CREATININE: 0.67 mg/dL (ref 0.57–1.00)
GFR, EST AFRICAN AMERICAN: 121 mL/min/{1.73_m2} (ref >59)
GFR, EST NON AFRICAN AMERICAN: 105 mL/min/{1.73_m2} (ref >59)
Globulin, Total: 3.6 g/dL (ref 1.5–4.5)
Glucose: 124 mg/dL — ABNORMAL HIGH (ref 65–99)
Potassium: 4 mmol/L (ref 3.5–5.2)
Sodium: 139 mmol/L (ref 134–144)
TOTAL PROTEIN: 7.6 g/dL (ref 6.0–8.5)

## 2016-11-12 LAB — CBC WITH DIFFERENTIAL/PLATELET
BASOS ABS: 0 10*3/uL (ref 0.0–0.2)
Basos: 0 %
EOS (ABSOLUTE): 0.2 10*3/uL (ref 0.0–0.4)
Eos: 3 %
Hematocrit: 35 % (ref 34.0–46.6)
Hemoglobin: 11.5 g/dL (ref 11.1–15.9)
IMMATURE GRANS (ABS): 0 10*3/uL (ref 0.0–0.1)
IMMATURE GRANULOCYTES: 0 %
LYMPHS: 44 %
Lymphocytes Absolute: 2.9 10*3/uL (ref 0.7–3.1)
MCH: 30.1 pg (ref 26.6–33.0)
MCHC: 32.9 g/dL (ref 31.5–35.7)
MCV: 92 fL (ref 79–97)
MONOCYTES: 6 %
Monocytes Absolute: 0.4 10*3/uL (ref 0.1–0.9)
NEUTROS PCT: 47 %
Neutrophils Absolute: 3.2 10*3/uL (ref 1.4–7.0)
PLATELETS: 293 10*3/uL (ref 150–379)
RBC: 3.82 x10E6/uL (ref 3.77–5.28)
RDW: 13.9 % (ref 12.3–15.4)
WBC: 6.7 10*3/uL (ref 3.4–10.8)

## 2016-11-12 LAB — MICROALBUMIN, URINE: Microalbumin, Urine: 36.8 ug/mL

## 2016-11-13 ENCOUNTER — Encounter: Payer: Self-pay | Admitting: Physician Assistant

## 2016-11-16 NOTE — Addendum Note (Signed)
Addended by: Fara Chute on: 11/16/2016 04:28 PM   Modules accepted: Orders

## 2016-11-21 ENCOUNTER — Telehealth: Payer: Self-pay

## 2016-11-21 NOTE — Telephone Encounter (Signed)
Brandi Gomez  Patient would like to talk to you regarding her labs and bleeding issues  [phone]

## 2016-11-22 ENCOUNTER — Ambulatory Visit (INDEPENDENT_AMBULATORY_CARE_PROVIDER_SITE_OTHER): Payer: Commercial Managed Care - HMO | Admitting: Physician Assistant

## 2016-11-22 VITALS — BP 122/80 | HR 95 | Temp 98.5°F | Resp 16 | Ht 61.0 in | Wt 282.0 lb

## 2016-11-22 DIAGNOSIS — R102 Pelvic and perineal pain: Secondary | ICD-10-CM

## 2016-11-22 DIAGNOSIS — R748 Abnormal levels of other serum enzymes: Secondary | ICD-10-CM

## 2016-11-22 LAB — POCT URINALYSIS DIP (MANUAL ENTRY)
BILIRUBIN UA: NEGATIVE
GLUCOSE UA: NEGATIVE
Leukocytes, UA: NEGATIVE
Nitrite, UA: NEGATIVE
Protein Ur, POC: 100 — AB
UROBILINOGEN UA: 0.2
pH, UA: 5.5

## 2016-11-22 LAB — POCT CBC
Granulocyte percent: 61.1 %G (ref 37–80)
HCT, POC: 33.4 % — AB (ref 37.7–47.9)
HEMOGLOBIN: 11.6 g/dL — AB (ref 12.2–16.2)
LYMPH, POC: 2.5 (ref 0.6–3.4)
MCH, POC: 31 pg (ref 27–31.2)
MCHC: 34.8 g/dL (ref 31.8–35.4)
MCV: 89.3 fL (ref 80–97)
MID (cbc): 0.3 (ref 0–0.9)
MPV: 8.1 fL (ref 0–99.8)
PLATELET COUNT, POC: 244 10*3/uL (ref 142–424)
POC Granulocyte: 4.5 (ref 2–6.9)
POC LYMPH PERCENT: 34.2 %L (ref 10–50)
POC MID %: 4.7 % (ref 0–12)
RBC: 3.74 M/uL — AB (ref 4.04–5.48)
RDW, POC: 13.8 %
WBC: 7.3 10*3/uL (ref 4.6–10.2)

## 2016-11-22 LAB — IFOBT (OCCULT BLOOD): IMMUNOLOGICAL FECAL OCCULT BLOOD TEST: POSITIVE

## 2016-11-22 MED ORDER — IBUPROFEN 600 MG PO TABS: 600.0000 mg | ORAL_TABLET | Freq: Three times a day (TID) | ORAL | 0 refills | Status: DC

## 2016-11-22 NOTE — Progress Notes (Signed)
 Brandi Gomez  MRN: [id number] DOB: 01/10/1971  Subjective:  Pt presents to clinic with abd pain with pressure in her rectum and vagina - the pain is sharp and feels like there is something stuck in her vagina.  The pain has not changed since it started.  She is having no urinary or vaginal symptoms.  The pain is intermittent and only is helped with the tylenol.  No problems with eating today.    Significant other is with her today.  Review of Systems  Constitutional: Negative for chills and fever.  Gastrointestinal: Positive for abdominal pain (low). Negative for blood in stool, constipation and diarrhea.  Genitourinary: Positive for menstrual problem (current - LMP yesterday). Negative for dysuria, urgency and vaginal discharge.    Patient Active Problem List   Diagnosis Date Noted  . Elevated alkaline phosphatase level 11/13/2016  . Common migraine 05/19/2015  . OSA (obstructive sleep apnea) 03/09/2013  . Encounter for therapeutic drug monitoring 10/16/2012  . Generalized anxiety disorder 06/23/2012  . Essential hypertension 07/27/2008  . HEADACHE 12/11/2007  . ANKLE EDEMA 05/08/2007  . OBESITY, NOS 02/05/2007  . DEPRESSION, MAJOR, RECURRENT 02/05/2007  . Convulsions (HCC) 02/05/2007  . APNEA, SLEEP 02/05/2007    Current Outpatient Prescriptions on File Prior to Visit  Medication Sig Dispense Refill  . chlorhexidine (PERIDEX) 0.12 % solution     . DILANTIN 100 MG ER capsule TAKE 2 CAPSULES IN THE MORNING AND 3 CAPSULES IN THE EVENING 450 capsule 2  . glucose blood test strip Test blood sugar daily. Dx E11.9 100 each 3  . glucose monitoring kit (FREESTYLE) monitoring kit Test blood sugar daily. Dx E11.9 1 each 0  . Lancets MISC Test blood sugar daily. Dx E11.9 100 each 2  . sitaGLIPtin (JANUVIA) 50 MG tablet Take 1 tablet (50 mg total) by mouth daily. For control of blood sugar 90 tablet 1  . torsemide (DEMADEX) 20 MG tablet Take 1 tablet (20 mg total) by mouth daily.  90 tablet 1   No current facility-administered medications on file prior to visit.     Allergies  Allergen Reactions  . Amoxicillin Itching  . Hydrocodone Other (See Comments)    Depressed     Pt patients past, family and social history were reviewed and updated.   Objective:  BP 122/80 (BP Location: Right Arm, Patient Position: Sitting, Cuff Size: Large)   Pulse 95   Temp 98.5 F (36.9 C) (Oral)   Resp 16   Ht 5' 1" (1.549 m)   Wt 282 lb (127.9 kg)   LMP 11/21/2016 (Exact Date)   SpO2 96%   BMI 53.28 kg/m   Physical Exam  Constitutional: She is oriented to person, place, and time and well-developed, well-nourished, and in no distress.  HENT:  Head: Normocephalic and atraumatic.  Right Ear: Hearing and external ear normal.  Left Ear: Hearing and external ear normal.  Eyes: Conjunctivae are normal.  Neck: Normal range of motion.  Pulmonary/Chest: Effort normal.  Abdominal: There is tenderness (generalized).  Genitourinary: Uterus normal, cervix normal, right adnexa normal, left adnexa normal and vulva normal. Rectal exam shows guaiac positive stool. Rectal exam shows no external hemorrhoid and no mass.  Genitourinary Comments: On menses  Neurological: She is alert and oriented to person, place, and time. Gait normal.  Skin: Skin is warm and dry.  Psychiatric: Mood, memory, affect and judgment normal.  Vitals reviewed.  Results for orders placed or performed in visit on 11/22/16  POCT   urinalysis dipstick  Result Value Ref Range   Color, UA yellow yellow   Clarity, UA clear clear   Glucose, UA negative negative   Bilirubin, UA small (A) negative   Ketones, POC UA negative negative   Spec Grav, UA >=1.030    Blood, UA large (A) negative   pH, UA 5.5    Protein Ur, POC =100 (A) negative   Urobilinogen, UA 0.2    Nitrite, UA Negative Negative   Leukocytes, UA Negative Negative  POCT CBC  Result Value Ref Range   WBC 7.3 4.6 - 10.2 K/uL   Lymph, poc 2.5 0.6 -  3.4   POC LYMPH PERCENT 34.2 10 - 50 %L   MID (cbc) 0.3 0 - 0.9   POC MID % 4.7 0 - 12 %M   POC Granulocyte 4.5 2 - 6.9   Granulocyte percent 61.1 37 - 80 %G   RBC 3.74 (A) 4.04 - 5.48 M/uL   Hemoglobin 11.6 (A) 12.2 - 16.2 g/dL   HCT, POC 33.4 (A) 37.7 - 47.9 %   MCV 89.3 80 - 97 fL   MCH, POC 31.0 27 - 31.2 pg   MCHC 34.8 31.8 - 35.4 g/dL   RDW, POC 13.8 %   Platelet Count, POC 244 142 - 424 K/uL   MPV 8.1 0 - 99.8 fL  IFOBT POC (occult bld, rslt in office)  Result Value Ref Range   IFOBT Positive     Assessment and Plan :  Pelvic pain - Plan: POCT urinalysis dipstick, POCT CBC, GC/Chlamydia Probe Amp, IFOBT POC (occult bld, rslt in office), ibuprofen (ADVIL,MOTRIN) 600 MG tablet - unsure of cause of the patient's pain - she did just start her menses so that is a possibility but she has never had this type of pain before - fibroids are possible but I am unable to palpate any on exam due to size of patient - she is sexually active and we will check for STIs - she has a normal blood count and un unremarkable exam - she will watch and wait over the next several days - she will RTC if pain changes or get worse - she will increase her hydration as her urine is concentrated today.  She will be mindful of her bowel habits as constipation can cause abdominal pain.  She has blood in her stool which should be rechecked in the next month or so and if still present patient will be sent to GI.  Elevated alkaline phosphatase level - Plan: Gamma GT, Alkaline Phosphatase, Isoenzymes - repeat labs - pt does not drink ETOH - she drinks mostly sodas - she has never been exposed to someone who has Hep B or Hep C  Sarah Weber PA-C  [facility] 11/22/2016 7:35 PM 

## 2016-11-22 NOTE — Patient Instructions (Addendum)
Increase your fluid intake  Watch your stool to make sure they stay soft and long  If no better return to clinic in 3-4 days if worse seek medical attention at that time    IF you received an x-ray today, you will receive an invoice from [facility]. Please contact [facility] at [phone] with questions or concerns regarding your invoice.   IF you received labwork today, you will receive an invoice from [place] [place]. Please contact [facility] at [phone] with questions or concerns regarding your invoice.   Our billing staff will not be able to assist you with questions regarding bills from these companies.  You will be contacted with the lab results as soon as they are available. The fastest way to get your results is to activate your My Chart account. Instructions are located on the last page of this paperwork. If you have not heard from [place] regarding the results in 2 weeks, please contact this office.

## 2016-11-25 LAB — ALKALINE PHOSPHATASE, ISOENZYMES
ALK PHOS: 213 IU/L — AB (ref 39–117)
BONE FRACTION: 18 % (ref 14–68)
INTESTINAL FRAC.: 6 % (ref 0–18)
LIVER FRACTION: 76 % (ref 18–85)

## 2016-11-25 LAB — GAMMA GT: GGT: 216 IU/L — ABNORMAL HIGH (ref 0–60)

## 2016-11-25 NOTE — Telephone Encounter (Signed)
Spoke with pt and transferred to Brandi Gomez to discuss lab results.

## 2016-11-26 LAB — GC/CHLAMYDIA PROBE AMP
Chlamydia trachomatis, NAA: NEGATIVE
Neisseria gonorrhoeae by PCR: NEGATIVE

## 2016-12-04 ENCOUNTER — Telehealth: Payer: Self-pay

## 2016-12-04 DIAGNOSIS — R1011 Right upper quadrant pain: Secondary | ICD-10-CM

## 2016-12-04 NOTE — Telephone Encounter (Signed)
-----   Message from [place], [place] sent at 11/29/2016 12:15 PM EST ----- Please call this patient. The blood tests suggest that there may be a problem with the liver/gallbladder. She needs to have an ultrasound of the RIGHT Upper quadrant of the abdomen to look at those organs.   [place] RUQ, routine Dx: elevated alkaline phosphatase, elevated GGT

## 2016-12-04 NOTE — Telephone Encounter (Signed)
12/12/16 [place] 845am npo after midnight  Pt advised.(pt wanted after 12/09/16)

## 2016-12-12 ENCOUNTER — Ambulatory Visit (HOSPITAL_COMMUNITY)
Admission: RE | Admit: 2016-12-12 | Discharge: 2016-12-12 | Disposition: A | Discharge status: Home / Self Care | Payer: Medicare HMO | Source: Ambulatory Visit | Attending: Physician Assistant | Admitting: Physician Assistant

## 2016-12-12 DIAGNOSIS — R1011 Right upper quadrant pain: Secondary | ICD-10-CM | POA: Insufficient documentation

## 2016-12-24 ENCOUNTER — Telehealth: Payer: Self-pay | Admitting: Physician Assistant

## 2016-12-24 NOTE — Telephone Encounter (Signed)
Pt has a form for [employer] - please call the patient and tell her to f/u with her PCP regarding this form - I have placed the form in the nurse box.  It looks like Brandi Gomez is her PCP.

## 2016-12-24 NOTE — Telephone Encounter (Signed)
lmtcb to schedule

## 2016-12-27 NOTE — Telephone Encounter (Signed)
Form completed and faxed for pt to get meals from [facility] signed form - pt of [place] - DM

## 2017-01-20 ENCOUNTER — Telehealth (HOSPITAL_COMMUNITY): Payer: Self-pay

## 2017-01-20 NOTE — Telephone Encounter (Signed)
Returned pt's call / L/M on v/mail with call back information. [phone]

## 2017-02-18 ENCOUNTER — Other Ambulatory Visit: Payer: Self-pay | Admitting: Neurology

## 2017-03-05 ENCOUNTER — Telehealth: Payer: Self-pay

## 2017-03-05 NOTE — Telephone Encounter (Signed)
Looks like patient was rescheduled to a spot that was blocked on 4/12. Dr. Rexene Alberts will be out for a meeting at that time. Can you please reschedule her? Feel free to use any open slot this week.

## 2017-03-05 NOTE — Telephone Encounter (Signed)
Patient has been rescheduled for 03/25/17 @ 10:00am.

## 2017-03-12 ENCOUNTER — Ambulatory Visit: Payer: Commercial Managed Care - HMO | Admitting: Neurology

## 2017-03-14 ENCOUNTER — Ambulatory Visit (INDEPENDENT_AMBULATORY_CARE_PROVIDER_SITE_OTHER): Payer: Medicare HMO

## 2017-03-14 ENCOUNTER — Ambulatory Visit (INDEPENDENT_AMBULATORY_CARE_PROVIDER_SITE_OTHER): Payer: Medicare HMO | Admitting: Emergency Medicine

## 2017-03-14 VITALS — BP 124/84 | HR 101 | Temp 99.9°F | Resp 18 | Ht 61.0 in | Wt 273.5 lb

## 2017-03-14 DIAGNOSIS — R6889 Other general symptoms and signs: Secondary | ICD-10-CM | POA: Diagnosis not present

## 2017-03-14 DIAGNOSIS — R52 Pain, unspecified: Secondary | ICD-10-CM

## 2017-03-14 DIAGNOSIS — R05 Cough: Secondary | ICD-10-CM | POA: Diagnosis not present

## 2017-03-14 DIAGNOSIS — J209 Acute bronchitis, unspecified: Secondary | ICD-10-CM | POA: Diagnosis not present

## 2017-03-14 LAB — POCT INFLUENZA A/B
Influenza A, POC: NEGATIVE
Influenza B, POC: NEGATIVE

## 2017-03-14 LAB — POCT CBC
GRANULOCYTE PERCENT: 78.7 % (ref 37–80)
HEMATOCRIT: 33.5 % — AB (ref 37.7–47.9)
HEMOGLOBIN: 11.4 g/dL — AB (ref 12.2–16.2)
LYMPH, POC: 1.9 (ref 0.6–3.4)
MCH: 30.7 pg (ref 27–31.2)
MCHC: 34.1 g/dL (ref 31.8–35.4)
MCV: 90 fL (ref 80–97)
MID (cbc): 0.8 (ref 0–0.9)
MPV: 7.8 fL (ref 0–99.8)
POC GRANULOCYTE: 10.1 — AB (ref 2–6.9)
POC LYMPH PERCENT: 15 %L (ref 10–50)
POC MID %: 6.3 % (ref 0–12)
Platelet Count, POC: 261 10*3/uL (ref 142–424)
RBC: 3.72 M/uL — AB (ref 4.04–5.48)
RDW, POC: 14.3 %
WBC: 12.8 10*3/uL — AB (ref 4.6–10.2)

## 2017-03-14 MED ORDER — DM-GUAIFENESIN ER 30-600 MG PO TB12: 1.0000 | ORAL_TABLET | Freq: Two times a day (BID) | ORAL | 0 refills | Status: AC

## 2017-03-14 MED ORDER — PROMETHAZINE-DM 6.25-15 MG/5ML PO SYRP: 5.0000 mL | ORAL_SOLUTION | Freq: Four times a day (QID) | ORAL | 0 refills | Status: DC | PRN

## 2017-03-14 MED ORDER — AZITHROMYCIN 250 MG PO TABS: ORAL_TABLET | ORAL | 0 refills | Status: DC

## 2017-03-14 NOTE — Patient Instructions (Addendum)
     IF you received an x-ray today, you will receive an invoice from [facility]. Please contact [facility] at [phone] with questions or concerns regarding your invoice.   IF you received labwork today, you will receive an invoice from [facility]. Please contact [facility] at [phone] with questions or concerns regarding your invoice.   Our billing staff will not be able to assist you with questions regarding bills from these companies.  You will be contacted with the lab results as soon as they are available. The fastest way to get your results is to activate your My Chart account. Instructions are located on the last page of this paperwork. If you have not heard from us regarding the results in 2 weeks, please contact this office.      Acute Bronchitis, Adult Acute bronchitis is when air tubes (bronchi) in the lungs suddenly get swollen. The condition can make it hard to breathe. It can also cause these symptoms:  A cough.  Coughing up clear, yellow, or green mucus.  Wheezing.  Chest congestion.  Shortness of breath.  A fever.  Body aches.  Chills.  A sore throat.  Follow these instructions at home: Medicines  Take over-the-counter and prescription medicines only as told by your doctor.  If you were prescribed an antibiotic medicine, take it as told by your doctor. Do not stop taking the antibiotic even if you start to feel better. General instructions  Rest.  Drink enough fluids to keep your pee (urine) clear or pale yellow.  Avoid smoking and secondhand smoke. If you smoke and you need help quitting, ask your doctor. Quitting will help your lungs heal faster.  Use an inhaler, cool mist vaporizer, or humidifier as told by your doctor.  Keep all follow-up visits as told by your doctor. This is important. How is this prevented? To lower your risk of getting this condition again:  Wash your hands often with soap and water. If you cannot  use soap and water, use hand sanitizer.  Avoid contact with people who have cold symptoms.  Try not to touch your hands to your mouth, nose, or eyes.  Make sure to get the flu shot every year.  Contact a doctor if:  Your symptoms do not get better in 2 weeks. Get help right away if:  You cough up blood.  You have chest pain.  You have very bad shortness of breath.  You become dehydrated.  You faint (pass out) or keep feeling like you are going to pass out.  You keep throwing up (vomiting).  You have a very bad headache.  Your fever or chills gets worse. This information is not intended to replace advice given to you by your health care provider. Make sure you discuss any questions you have with your health care provider. Document Released: 05/13/2008 Document Revised: 07/03/2016 Document Reviewed: 05/15/2016 Elsevier Interactive Patient Education  2017 Elsevier Inc.  

## 2017-03-14 NOTE — Progress Notes (Signed)
Brandi Gomez 46 y.o.   Chief Complaint  Patient presents with  . Generalized Body Aches    sxs started within the past few days  . Cough    hurts to cough; takes breath away  . Chills    states she is cold  . Fever    HISTORY OF PRESENT ILLNESS: This is a 46 y.o. female complaining of flu-like symptoms x 4-5 days.  URI   This is a new problem. The current episode started in the past 7 days. The problem has been gradually worsening. The maximum temperature recorded prior to her arrival was 100.4 - 100.9 F. The fever has been present for 1 to 2 days. Associated symptoms include congestion, coughing, headaches, rhinorrhea, sinus pain and a sore throat. Pertinent negatives include no abdominal pain, chest pain, diarrhea, dysuria, ear pain, joint pain, nausea, neck pain, rash, vomiting or wheezing.     Prior to Admission medications   Medication Sig Start Date End Date Taking? Authorizing Provider  levETIRAcetam (KEPPRA) 500 MG tablet Take 1 tablet (500 mg total) by mouth 2 (two) times daily. 02/19/17  Yes [name], MD  sitaGLIPtin (JANUVIA) 50 MG tablet Take 1 tablet (50 mg total) by mouth daily. For control of blood sugar 11/11/16  Yes Chelle Jeffery, PA-C  chlorhexidine (PERIDEX) 0.12 % solution  11/05/16   Historical Provider, MD  DILANTIN 100 MG ER capsule TAKE 2 CAPSULES IN THE MORNING AND 3 CAPSULES IN THE EVENING Patient not taking: Reported on 03/14/2017 05/20/16   [name], MD  glucose blood test strip Test blood sugar daily. Dx E11.9 Patient not taking: Reported on 03/14/2017 09/20/15   [name], MD  glucose monitoring kit (FREESTYLE) monitoring kit Test blood sugar daily. Dx E11.9 Patient not taking: Reported on 03/14/2017 09/20/15   [name], MD  ibuprofen (ADVIL,MOTRIN) 600 MG tablet Take 1 tablet (600 mg total) by mouth 3 (three) times daily. Patient not taking: Reported on 03/14/2017 11/22/16   Mancel Bale, PA-C  Lancets MISC Test blood sugar  daily. Dx E11.9 Patient not taking: Reported on 03/14/2017 09/20/15   [name], MD  torsemide (DEMADEX) 20 MG tablet Take 1 tablet (20 mg total) by mouth daily. Patient not taking: Reported on 03/14/2017 11/11/16   Harrison Mons, PA-C    Allergies  Allergen Reactions  . Amoxicillin Itching  . Hydrocodone Other (See Comments)    Depressed     Patient Active Problem List   Diagnosis Date Noted  . Elevated alkaline phosphatase level 11/13/2016  . Common migraine 05/19/2015  . OSA (obstructive sleep apnea) 03/09/2013  . Encounter for therapeutic drug monitoring 10/16/2012  . Generalized anxiety disorder 06/23/2012  . Essential hypertension 07/27/2008  . HEADACHE 12/11/2007  . ANKLE EDEMA 05/08/2007  . OBESITY, NOS 02/05/2007  . DEPRESSION, MAJOR, RECURRENT 02/05/2007  . Convulsions ([place]) 02/05/2007  . APNEA, SLEEP 02/05/2007    Past Medical History:  Diagnosis Date  . Anemia   . Anxiety   . Bipolar 1 disorder ([place])   . Common migraine 05/19/2015  . Depression   . Hypertension   . Mild mental retardation   . Obesity   . Partial complex seizure disorder with intractable epilepsy ([place]) 05/12/2014  . Seizures ([place])    intractable  . Sleep apnea   . Type II or unspecified type diabetes mellitus without mention of complication, not stated as uncontrolled     Past Surgical History:  Procedure Laterality Date  . COLONOSCOPY  2012-normal , Dr Sharlett Iles  . ESOPHAGOGASTRODUODENOSCOPY     normal-Dr Patterson 2012  . MYRINGOTOMY WITH TUBE PLACEMENT    . NASAL SINUS SURGERY      Social History   Social History  . Marital status: Single    Spouse name: N/A  . Number of children: 3  . Years of education: 12   Occupational History  . disabled   .  Disabled   Social History Main Topics  . Smoking status: Never Smoker  . Smokeless tobacco: Never Used  . Alcohol use No  . Drug use: No  . Sexual activity: Not on file   Other Topics Concern  . Not on file    Social History Narrative   Patient lives at home with daughter.    Patient has 3 children.    Patient is right handed.    Patient has a high school education.    Patient is on disability   Patient drinks 2 cups of caffeine daily.    Family History  Problem Relation Age of Onset  . Diabetes Mother     passed away from accidental death  . Mental illness Father   . Diabetes Daughter   . Cancer Daughter     leukemia  . Cancer Maternal Aunt     ovarian ca     Review of Systems  Constitutional: Positive for chills, fever and malaise/fatigue.  HENT: Positive for congestion, rhinorrhea, sinus pain and sore throat. Negative for ear pain and nosebleeds.   Eyes: Negative for blurred vision, double vision, discharge and redness.  Respiratory: Positive for cough and sputum production. Negative for hemoptysis, shortness of breath and wheezing.   Cardiovascular: Negative for chest pain, palpitations and leg swelling.  Gastrointestinal: Negative for abdominal pain, diarrhea, nausea and vomiting.  Genitourinary: Negative for dysuria and hematuria.  Musculoskeletal: Positive for myalgias. Negative for joint pain and neck pain.  Skin: Negative for rash.  Neurological: Positive for weakness and headaches. Negative for dizziness, sensory change and focal weakness.  Endo/Heme/Allergies: Negative.   All other systems reviewed and are negative.  Vitals:   03/14/17 1220  BP: 124/84  Pulse: (!) 101  Resp: 18  Temp: 99.9 F (37.7 C)     Physical Exam  Constitutional: She is oriented to person, place, and time. She appears well-developed.  Obese.  HENT:  Head: Normocephalic and atraumatic.  Right Ear: External ear normal.  Left Ear: External ear normal.  Mouth/Throat: Uvula is midline. Posterior oropharyngeal erythema present. No oropharyngeal exudate.  Eyes: Conjunctivae and EOM are normal. Pupils are equal, round, and reactive to light.  Neck: Normal range of motion. Neck supple. No  JVD present. No thyromegaly present.  Cardiovascular: Normal rate, regular rhythm and normal heart sounds.   Pulmonary/Chest: Effort normal and breath sounds normal.  Abdominal: Soft. Bowel sounds are normal. There is no tenderness.  Musculoskeletal: Normal range of motion.  Lymphadenopathy:    She has no cervical adenopathy.  Neurological: She is alert and oriented to person, place, and time. No sensory deficit. She exhibits normal muscle tone.  Skin: Skin is warm and dry.  Psychiatric: She has a normal mood and affect. Her behavior is normal.  Vitals reviewed.   CXR reviewed by me: NAD, no pneumonia Results for orders placed or performed in visit on 03/14/17 (from the past 24 hour(s))  POCT Influenza A/B     Status: None   Collection Time: 03/14/17 12:57 PM  Result Value Ref Range   Influenza  A, POC Negative Negative   Influenza B, POC Negative Negative  POCT CBC     Status: Abnormal   Collection Time: 03/14/17  1:17 PM  Result Value Ref Range   WBC 12.8 (A) 4.6 - 10.2 K/uL   Lymph, poc 1.9 0.6 - 3.4   POC LYMPH PERCENT 15.0 10 - 50 %L   MID (cbc) 0.8 0 - 0.9   POC MID % 6.3 0 - 12 %M   POC Granulocyte 10.1 (A) 2 - 6.9   Granulocyte percent 78.7 37 - 80 %G   RBC 3.72 (A) 4.04 - 5.48 M/uL   Hemoglobin 11.4 (A) 12.2 - 16.2 g/dL   HCT, POC 33.5 (A) 37.7 - 47.9 %   MCV 90.0 80 - 97 fL   MCH, POC 30.7 27 - 31.2 pg   MCHC 34.1 31.8 - 35.4 g/dL   RDW, POC 14.3 %   Platelet Count, POC 261 142 - 424 K/uL   MPV 7.8 0 - 99.8 fL     ASSESSMENT & PLAN: Brandi Gomez was seen today for generalized body aches, cough, chills and fever.  Diagnoses and all orders for this visit:  Acute bronchitis, unspecified organism  Body aches -     Cancel: POCT Influenza A -     POCT CBC  Flu-like symptoms -     POCT CBC -     POCT Influenza A/B -     DG Chest 2 View; Future  Other orders -     azithromycin (ZITHROMAX) 250 MG tablet; Sig as indicated -     Discontinue:  promethazine-dextromethorphan (PROMETHAZINE-DM) 6.25-15 MG/5ML syrup; Take 5 mLs by mouth 4 (four) times daily as needed for cough. -     dextromethorphan-guaiFENesin (MUCINEX DM) 30-600 MG 12hr tablet; Take 1 tablet by mouth 2 (two) times daily.    Patient Instructions       IF you received an x-ray today, you will receive an invoice from [facility]. Please contact [facility] at [phone] with questions or concerns regarding your invoice.   IF you received labwork today, you will receive an invoice from [facility]. Please contact [facility] at [phone] with questions or concerns regarding your invoice.   Our billing staff will not be able to assist you with questions regarding bills from these companies.  You will be contacted with the lab results as soon as they are available. The fastest way to get your results is to activate your My Chart account. Instructions are located on the last page of this paperwork. If you have not heard from [place] regarding the results in 2 weeks, please contact this office.      Acute Bronchitis, Adult Acute bronchitis is when air tubes (bronchi) in the lungs suddenly get swollen. The condition can make it hard to breathe. It can also cause these symptoms:  A cough.  Coughing up clear, yellow, or green mucus.  Wheezing.  Chest congestion.  Shortness of breath.  A fever.  Body aches.  Chills.  A sore throat. Follow these instructions at home: Medicines   Take over-the-counter and prescription medicines only as told by your doctor.  If you were prescribed an antibiotic medicine, take it as told by your doctor. Do not stop taking the antibiotic even if you start to feel better. General instructions   Rest.  Drink enough fluids to keep your pee (urine) clear or pale yellow.  Avoid smoking and secondhand smoke. If you smoke and you need help quitting, ask  your doctor. Quitting will help your lungs heal faster.  Use  an inhaler, cool mist vaporizer, or humidifier as told by your doctor.  Keep all follow-up visits as told by your doctor. This is important. How is this prevented? To lower your risk of getting this condition again:  Wash your hands often with soap and water. If you cannot use soap and water, use hand sanitizer.  Avoid contact with people who have cold symptoms.  Try not to touch your hands to your mouth, nose, or eyes.  Make sure to get the flu shot every year. Contact a doctor if:  Your symptoms do not get better in 2 weeks. Get help right away if:  You cough up blood.  You have chest pain.  You have very bad shortness of breath.  You become dehydrated.  You faint (pass out) or keep feeling like you are going to pass out.  You keep throwing up (vomiting).  You have a very bad headache.  Your fever or chills gets worse. This information is not intended to replace advice given to you by your health care provider. Make sure you discuss any questions you have with your health care provider. Document Released: 05/13/2008 Document Revised: 07/03/2016 Document Reviewed: 05/15/2016 Elsevier Interactive Patient Education  2017 Elsevier Inc.       Agustina Caroli, MD [facility]

## 2017-03-19 ENCOUNTER — Emergency Department (HOSPITAL_COMMUNITY)
Admission: EM | Admit: 2017-03-19 | Discharge: 2017-03-19 | Disposition: A | Discharge status: Home / Self Care | Payer: Medicare HMO | Attending: Emergency Medicine | Admitting: Emergency Medicine

## 2017-03-19 ENCOUNTER — Ambulatory Visit (INDEPENDENT_AMBULATORY_CARE_PROVIDER_SITE_OTHER): Payer: Medicare HMO

## 2017-03-19 ENCOUNTER — Ambulatory Visit (INDEPENDENT_AMBULATORY_CARE_PROVIDER_SITE_OTHER): Payer: Medicare HMO | Admitting: Emergency Medicine

## 2017-03-19 ENCOUNTER — Encounter (HOSPITAL_COMMUNITY): Payer: Self-pay

## 2017-03-19 ENCOUNTER — Encounter: Payer: Self-pay | Admitting: Emergency Medicine

## 2017-03-19 VITALS — BP 131/87 | HR 100 | Temp 99.5°F | Resp 18 | Ht <= 58 in | Wt 276.2 lb

## 2017-03-19 DIAGNOSIS — R05 Cough: Secondary | ICD-10-CM | POA: Insufficient documentation

## 2017-03-19 DIAGNOSIS — I10 Essential (primary) hypertension: Secondary | ICD-10-CM | POA: Diagnosis not present

## 2017-03-19 DIAGNOSIS — G479 Sleep disorder, unspecified: Secondary | ICD-10-CM | POA: Diagnosis not present

## 2017-03-19 DIAGNOSIS — J209 Acute bronchitis, unspecified: Secondary | ICD-10-CM

## 2017-03-19 DIAGNOSIS — Z79899 Other long term (current) drug therapy: Secondary | ICD-10-CM | POA: Diagnosis not present

## 2017-03-19 DIAGNOSIS — E119 Type 2 diabetes mellitus without complications: Secondary | ICD-10-CM | POA: Insufficient documentation

## 2017-03-19 DIAGNOSIS — R059 Cough, unspecified: Secondary | ICD-10-CM

## 2017-03-19 DIAGNOSIS — Z7984 Long term (current) use of oral hypoglycemic drugs: Secondary | ICD-10-CM | POA: Diagnosis not present

## 2017-03-19 DIAGNOSIS — R030 Elevated blood-pressure reading, without diagnosis of hypertension: Secondary | ICD-10-CM | POA: Diagnosis not present

## 2017-03-19 MED ORDER — BENZONATATE 100 MG PO CAPS
200.0000 mg | ORAL_CAPSULE | Freq: Once | ORAL | Status: AC
  Administered 2017-03-19: 200 mg via ORAL
  Filled 2017-03-19: qty 2

## 2017-03-19 MED ORDER — IPRATROPIUM-ALBUTEROL 0.5-2.5 (3) MG/3ML IN SOLN
3.0000 mL | Freq: Once | RESPIRATORY_TRACT | Status: AC
  Administered 2017-03-19: 3 mL via RESPIRATORY_TRACT
  Filled 2017-03-19: qty 3

## 2017-03-19 MED ORDER — AEROCHAMBER PLUS FLO-VU LARGE MISC
1.0000 | Freq: Once | Status: AC
  Administered 2017-03-19: 1
  Filled 2017-03-19 (×2): qty 1

## 2017-03-19 MED ORDER — METHYLPREDNISOLONE SODIUM SUCC 125 MG IJ SOLR
125.0000 mg | Freq: Once | INTRAMUSCULAR | Status: AC
  Administered 2017-03-19: 125 mg via INTRAMUSCULAR

## 2017-03-19 MED ORDER — PREDNISONE 20 MG PO TABS: 40.0000 mg | ORAL_TABLET | Freq: Every day | ORAL | 0 refills | Status: DC

## 2017-03-19 MED ORDER — ALBUTEROL SULFATE HFA 108 (90 BASE) MCG/ACT IN AERS
2.0000 | INHALATION_SPRAY | Freq: Once | RESPIRATORY_TRACT | Status: AC
  Administered 2017-03-19: 2 via RESPIRATORY_TRACT
  Filled 2017-03-19: qty 6.7

## 2017-03-19 MED ORDER — BENZONATATE 100 MG PO CAPS: 100.0000 mg | ORAL_CAPSULE | Freq: Three times a day (TID) | ORAL | 0 refills | Status: DC | PRN

## 2017-03-19 NOTE — ED Triage Notes (Signed)
Patient from home with c/o cough x4 days.  Patient ambulatory with EMS to room.  Patient has seen PCP and been prescribed medications without relief.  BP en route 154/86, 99HR, 20RR, 97%RA.

## 2017-03-19 NOTE — Patient Instructions (Addendum)
  Continue medications as prescribed today in the ER.   IF you received an x-ray today, you will receive an invoice from [facility]. Please contact [facility] at [phone] with questions or concerns regarding your invoice.   IF you received labwork today, you will receive an invoice from [place]. Please contact [facility] at [phone] with questions or concerns regarding your invoice.   Our billing staff will not be able to assist you with questions regarding bills from these companies.  You will be contacted with the lab results as soon as they are available. The fastest way to get your results is to activate your My Chart account. Instructions are located on the last page of this paperwork. If you have not heard from [place] regarding the results in 2 weeks, please contact this office.      Acute Bronchitis, Adult Acute bronchitis is when air tubes (bronchi) in the lungs suddenly get swollen. The condition can make it hard to breathe. It can also cause these symptoms:  A cough.  Coughing up clear, yellow, or green mucus.  Wheezing.  Chest congestion.  Shortness of breath.  A fever.  Body aches.  Chills.  A sore throat. Follow these instructions at home: Medicines   Take over-the-counter and prescription medicines only as told by your doctor.  If you were prescribed an antibiotic medicine, take it as told by your doctor. Do not stop taking the antibiotic even if you start to feel better. General instructions   Rest.  Drink enough fluids to keep your pee (urine) clear or pale yellow.  Avoid smoking and secondhand smoke. If you smoke and you need help quitting, ask your doctor. Quitting will help your lungs heal faster.  Use an inhaler, cool mist vaporizer, or humidifier as told by your doctor.  Keep all follow-up visits as told by your doctor. This is important. How is this prevented? To lower your risk of getting this condition again:  Wash  your hands often with soap and water. If you cannot use soap and water, use hand sanitizer.  Avoid contact with people who have cold symptoms.  Try not to touch your hands to your mouth, nose, or eyes.  Make sure to get the flu shot every year. Contact a doctor if:  Your symptoms do not get better in 2 weeks. Get help right away if:  You cough up blood.  You have chest pain.  You have very bad shortness of breath.  You become dehydrated.  You faint (pass out) or keep feeling like you are going to pass out.  You keep throwing up (vomiting).  You have a very bad headache.  Your fever or chills gets worse. This information is not intended to replace advice given to you by your health care provider. Make sure you discuss any questions you have with your health care provider. Document Released: 05/13/2008 Document Revised: 07/03/2016 Document Reviewed: 05/15/2016 Elsevier Interactive Patient Education  2017 [employer].

## 2017-03-19 NOTE — ED Provider Notes (Signed)
[place] DEPT Provider Note   CSN: [id number] Arrival date & time: 03/19/17  0403    History   Chief Complaint Chief Complaint  Patient presents with  . Cough    HPI Brandi Gomez is a 46 y.o. female.   46 year old female with a history of diabetes mellitus, hypertension, bipolar 1 disorder, and mild mental retardation presents to the emergency department for evaluation of cough. She was seen by her primary care doctor for this and diagnosed with acute bronchitis. She took azithromycin which was prescribed to her as well as Robitussin without relief. She continues to complain of a dry cough which is sporadic and worse with deep breathing. She had chills and this really, but denies known fever. She has not had any vomiting or diarrhea. No known sick contacts. Chest x-ray performed 5 days ago was negative for pneumonia.   The history is provided by the patient. No language interpreter was used.  Cough     Past Medical History:  Diagnosis Date  . Anemia   . Anxiety   . Bipolar 1 disorder ([place])   . Common migraine 05/19/2015  . Depression   . Hypertension   . Mild mental retardation   . Obesity   . Partial complex seizure disorder with intractable epilepsy ([place]) 05/12/2014  . Seizures ([place])    intractable  . Sleep apnea   . Type II or unspecified type diabetes mellitus without mention of complication, not stated as uncontrolled     Patient Active Problem List   Diagnosis Date Noted  . Body aches 03/14/2017  . Flu-like symptoms 03/14/2017  . Acute bronchitis 03/14/2017  . Elevated alkaline phosphatase level 11/13/2016  . Common migraine 05/19/2015  . OSA (obstructive sleep apnea) 03/09/2013  . Encounter for therapeutic drug monitoring 10/16/2012  . Generalized anxiety disorder 06/23/2012  . Essential hypertension 07/27/2008  . HEADACHE 12/11/2007  . ANKLE EDEMA 05/08/2007  . OBESITY, NOS 02/05/2007  . DEPRESSION, MAJOR, RECURRENT 02/05/2007  . Convulsions  ([place]) 02/05/2007  . APNEA, SLEEP 02/05/2007    Past Surgical History:  Procedure Laterality Date  . COLONOSCOPY     2012-normal , Dr Sharlett Iles  . ESOPHAGOGASTRODUODENOSCOPY     normal-Dr Patterson 2012  . MYRINGOTOMY WITH TUBE PLACEMENT    . NASAL SINUS SURGERY      OB History    No data available       Home Medications    Prior to Admission medications   Medication Sig Start Date End Date Taking? Authorizing Provider  azithromycin (ZITHROMAX) 250 MG tablet Sig as indicated 03/14/17   [facility], MD  benzonatate (TESSALON) 100 MG capsule Take 1-2 capsules (100-200 mg total) by mouth 3 (three) times daily as needed for cough. 03/19/17   Antonietta Breach, PA-C  chlorhexidine (PERIDEX) 0.12 % solution  11/05/16   Historical Provider, MD  dextromethorphan-guaiFENesin (MUCINEX DM) 30-600 MG 12hr tablet Take 1 tablet by mouth 2 (two) times daily. 03/14/17 03/21/17  Horald Pollen, MD  DILANTIN 100 MG ER capsule TAKE 2 CAPSULES IN THE MORNING AND 3 CAPSULES IN THE EVENING Patient not taking: Reported on 03/14/2017 05/20/16   [name], MD  glucose blood test strip Test blood sugar daily. Dx E11.9 Patient not taking: Reported on 03/14/2017 09/20/15   [name], MD  glucose monitoring kit (FREESTYLE) monitoring kit Test blood sugar daily. Dx E11.9 Patient not taking: Reported on 03/14/2017 09/20/15   [name], MD  ibuprofen (ADVIL,MOTRIN) 600 MG tablet Take  1 tablet (600 mg total) by mouth 3 (three) times daily. Patient not taking: Reported on 03/14/2017 11/22/16   Mancel Bale, PA-C  Lancets MISC Test blood sugar daily. Dx E11.9 Patient not taking: Reported on 03/14/2017 09/20/15   [name], MD  levETIRAcetam (KEPPRA) 500 MG tablet Take 1 tablet (500 mg total) by mouth 2 (two) times daily. 02/19/17   [name], MD  predniSONE (DELTASONE) 20 MG tablet Take 2 tablets (40 mg total) by mouth daily. 03/19/17   Antonietta Breach, PA-C  sitaGLIPtin (JANUVIA) 50 MG tablet  Take 1 tablet (50 mg total) by mouth daily. For control of blood sugar 11/11/16   Chelle Jeffery, PA-C  torsemide (DEMADEX) 20 MG tablet Take 1 tablet (20 mg total) by mouth daily. Patient not taking: Reported on 03/14/2017 11/11/16   Harrison Mons, PA-C    Family History Family History  Problem Relation Age of Onset  . Diabetes Mother     passed away from accidental death  . Mental illness Father   . Diabetes Daughter   . Cancer Daughter     leukemia  . Cancer Maternal Aunt     ovarian ca    Social History Social History  Substance Use Topics  . Smoking status: Never Smoker  . Smokeless tobacco: Never Used  . Alcohol use No     Allergies   Amoxicillin and Hydrocodone   Review of Systems Review of Systems  Respiratory: Positive for cough.   Ten systems reviewed and are negative for acute change, except as noted in the HPI.    Physical Exam Updated Vital Signs BP (!) 136/96 (BP Location: Left Arm)   Pulse 94   Temp 98.7 F (37.1 C) (Oral)   Resp 19   Ht _0  (1.448 m)   Wt 123.8 kg   LMP 02/20/2017 (Approximate)   SpO2 99%   BMI 59.08 kg/m   Physical Exam  Constitutional: She is oriented to person, place, and time. She appears well-developed and well-nourished. No distress.  Nontoxic and in NAD. Morbidly obese.  HENT:  Head: Normocephalic and atraumatic.  Patient tolerating secretions without difficulty.  Eyes: Conjunctivae and EOM are normal. No scleral icterus.  Neck: Normal range of motion.  Cardiovascular: Normal rate, regular rhythm and intact distal pulses.   Pulmonary/Chest: Effort normal. No respiratory distress. She has no wheezes. She has no rales.  Respirations even and unlabored. Very faint expiratory wheeze; scattered. Dry cough appreciated at bedside. No rales or rhonchi.  Musculoskeletal: Normal range of motion.  Neurological: She is alert and oriented to person, place, and time. Coordination normal.  Skin: Skin is warm and dry. No rash  noted. She is not diaphoretic. No erythema. No pallor.  Psychiatric: She has a normal mood and affect. Her behavior is normal.  Nursing note and vitals reviewed.    ED Treatments / Results  Labs (all labs ordered are listed, but only abnormal results are displayed) Labs Reviewed - No data to display   Dg Chest 2 View  Result Date: 03/14/2017 CLINICAL DATA:  Cough and fever.  Rule out pneumonia EXAM: CHEST  2 VIEW COMPARISON:  01/01/2014 FINDINGS: Chronic cardiomegaly.  Stable aortic and hilar contours. There is minimal linear opacity in the left mid lung. There is no edema, consolidation, effusion, or pneumothorax. No acute osseous finding. IMPRESSION: 1. No indication of pneumonia. Probable minor atelectasis on the left. 2. Chronic cardiomegaly. Electronically Signed   By: Monte Fantasia M.D.   On:  03/14/2017 13:17    EKG  EKG Interpretation None       Radiology No results found.  Procedures Procedures (including critical care time)  Medications Ordered in ED Medications  benzonatate (TESSALON) capsule 200 mg (not administered)  albuterol (PROVENTIL HFA;VENTOLIN HFA) 108 (90 Base) MCG/ACT inhaler 2 puff (not administered)  AEROCHAMBER PLUS FLO-VU LARGE MISC 1 each (not administered)  ipratropium-albuterol (DUONEB) 0.5-2.5 (3) MG/3ML nebulizer solution 3 mL (3 mLs Nebulization Given 03/19/17 0539)     Initial Impression / Assessment and Plan / ED Course  I have reviewed the triage vital signs and the nursing notes.  Pertinent labs & imaging results that were available during my care of the patient were reviewed by me and considered in my medical decision making (see chart for details).     46 year old female presents to the emergency department for evaluation of persistent cough. She was seen by her primary care doctor 5 days ago for similar symptoms. She has completed a Z-Pak without relief. She is afebrile today and without hypoxia. Previous CXR reviewed; negative for  PNA. Lung sounds are grossly clear, though scattered expiratory wheeze is noted. This improved on repeat examination following DuoNeb.   I do believe the patient's symptoms to be secondary to bronchitis; likely viral etiology. Will provide outpatient inhaler as well as course of prednisone and Tessalon for cough. Primary care follow-up advised and return precautions given. Patient discharged in stable condition with no unaddressed concerns.   Vitals:   03/19/17 0404 03/19/17 0407 03/19/17 0538 03/19/17 0539  BP: (!) 136/96     Pulse: 94     Resp: 19     Temp: 98.7 F (37.1 C)     TempSrc: Oral     SpO2: 96%  98% 99%  Weight:  123.8 kg    Height:  _0  (1.448 m)      Final Clinical Impressions(s) / ED Diagnoses   Final diagnoses:  Cough    New Prescriptions New Prescriptions   BENZONATATE (TESSALON) 100 MG CAPSULE    Take 1-2 capsules (100-200 mg total) by mouth 3 (three) times daily as needed for cough.   PREDNISONE (DELTASONE) 20 MG TABLET    Take 2 tablets (40 mg total) by mouth daily.     Antonietta Breach, PA-C 03/19/17 [place], MD 03/19/17 [phone]

## 2017-03-19 NOTE — ED Notes (Signed)
Pt reports improved breathing and states "I feel much better".  Lung sounds clear bilat.

## 2017-03-19 NOTE — Progress Notes (Signed)
Brandi Gomez 46 y.o.   Chief Complaint  Patient presents with  . Follow-up    pt not feeling short of breath, can't take a full breath or she starts coughing real hard, some yellow phelgm    HISTORY OF PRESENT ILLNESS: This is a 46 y.o. female seen by me 4/6 with acute bronchitis and started on Z-pak and cough medicine; seen in the ER today and given nebulizer; same diagnosis; has been c/o sob and persistent cough. No new symptoms.  HPI   Prior to Admission medications   Medication Sig Start Date End Date Taking? Authorizing Provider  DILANTIN 100 MG ER capsule TAKE 2 CAPSULES IN THE MORNING AND 3 CAPSULES IN THE EVENING 05/20/16  Yes [name], MD  levETIRAcetam (KEPPRA) 500 MG tablet Take 1 tablet (500 mg total) by mouth 2 (two) times daily. 02/19/17  Yes [name], MD  sitaGLIPtin (JANUVIA) 50 MG tablet Take 1 tablet (50 mg total) by mouth daily. For control of blood sugar 11/11/16  Yes Chelle Jeffery, PA-C  azithromycin (ZITHROMAX) 250 MG tablet Sig as indicated Patient not taking: Reported on 03/19/2017 03/14/17   Horald Pollen, MD  benzonatate (TESSALON) 100 MG capsule Take 1-2 capsules (100-200 mg total) by mouth 3 (three) times daily as needed for cough. Patient not taking: Reported on 03/19/2017 03/19/17   Antonietta Breach, PA-C  chlorhexidine (PERIDEX) 0.12 % solution  11/05/16   Historical Provider, MD  dextromethorphan-guaiFENesin (MUCINEX DM) 30-600 MG 12hr tablet Take 1 tablet by mouth 2 (two) times daily. Patient not taking: Reported on 03/19/2017 03/14/17 03/21/17  Horald Pollen, MD  glucose blood test strip Test blood sugar daily. Dx E11.9 Patient not taking: Reported on 03/14/2017 09/20/15   [name], MD  glucose monitoring kit (FREESTYLE) monitoring kit Test blood sugar daily. Dx E11.9 Patient not taking: Reported on 03/14/2017 09/20/15   [name], MD  ibuprofen (ADVIL,MOTRIN) 600 MG tablet Take 1 tablet (600 mg total) by mouth 3 (three)  times daily. Patient not taking: Reported on 03/14/2017 11/22/16   Mancel Bale, PA-C  Lancets MISC Test blood sugar daily. Dx E11.9 Patient not taking: Reported on 03/14/2017 09/20/15   [name], MD  predniSONE (DELTASONE) 20 MG tablet Take 2 tablets (40 mg total) by mouth daily. Patient not taking: Reported on 03/19/2017 03/19/17   Antonietta Breach, PA-C  torsemide (DEMADEX) 20 MG tablet Take 1 tablet (20 mg total) by mouth daily. Patient not taking: Reported on 03/14/2017 11/11/16   Harrison Mons, PA-C    Allergies  Allergen Reactions  . Amoxicillin Itching  . Hydrocodone Other (See Comments)    Depressed     Patient Active Problem List   Diagnosis Date Noted  . Body aches 03/14/2017  . Flu-like symptoms 03/14/2017  . Acute bronchitis 03/14/2017  . Elevated alkaline phosphatase level 11/13/2016  . Common migraine 05/19/2015  . OSA (obstructive sleep apnea) 03/09/2013  . Encounter for therapeutic drug monitoring 10/16/2012  . Generalized anxiety disorder 06/23/2012  . Essential hypertension 07/27/2008  . HEADACHE 12/11/2007  . ANKLE EDEMA 05/08/2007  . OBESITY, NOS 02/05/2007  . DEPRESSION, MAJOR, RECURRENT 02/05/2007  . Convulsions ([place]) 02/05/2007  . APNEA, SLEEP 02/05/2007    Past Medical History:  Diagnosis Date  . Anemia   . Anxiety   . Bipolar 1 disorder ([place])   . Common migraine 05/19/2015  . Depression   . Hypertension   . Mild mental retardation   . Obesity   .  Partial complex seizure disorder with intractable epilepsy ([place]) 05/12/2014  . Seizures ([place])    intractable  . Sleep apnea   . Type II or unspecified type diabetes mellitus without mention of complication, not stated as uncontrolled     Past Surgical History:  Procedure Laterality Date  . COLONOSCOPY     2012-normal , Dr Sharlett Iles  . ESOPHAGOGASTRODUODENOSCOPY     normal-Dr Patterson 2012  . MYRINGOTOMY WITH TUBE PLACEMENT    . NASAL SINUS SURGERY      Social History   Social History  .  Marital status: Single    Spouse name: N/A  . Number of children: 3  . Years of education: 12   Occupational History  . disabled   .  Disabled   Social History Main Topics  . Smoking status: Never Smoker  . Smokeless tobacco: Never Used  . Alcohol use No  . Drug use: No  . Sexual activity: Not on file   Other Topics Concern  . Not on file   Social History Narrative   Patient lives at home with daughter.    Patient has 3 children.    Patient is right handed.    Patient has a high school education.    Patient is on disability   Patient drinks 2 cups of caffeine daily.    Family History  Problem Relation Age of Onset  . Diabetes Mother     passed away from accidental death  . Mental illness Father   . Diabetes Daughter   . Cancer Daughter     leukemia  . Cancer Maternal Aunt     ovarian ca     Review of Systems  Constitutional: Negative for chills and fever.  HENT: Negative for ear pain, nosebleeds, sinus pain and sore throat.   Eyes: Negative for blurred vision, double vision, discharge and redness.  Respiratory: Positive for cough, sputum production, shortness of breath and wheezing. Negative for hemoptysis.   Cardiovascular: Negative for chest pain, palpitations and claudication.  Gastrointestinal: Negative for abdominal pain, diarrhea, nausea and vomiting.  Genitourinary: Negative for dysuria and hematuria.  Musculoskeletal: Negative for back pain, myalgias and neck pain.  Skin: Negative for rash.  Neurological: Negative for dizziness and headaches.  All other systems reviewed and are negative.    Vitals:   03/19/17 1405  BP: 131/87  Pulse: 100  Resp: 18  Temp: 99.5 F (37.5 C)   CXR: NAD  Physical Exam  Constitutional: She is oriented to person, place, and time. She appears well-developed and well-nourished.  HENT:  Head: Normocephalic and atraumatic.  Nose: Nose normal.  Mouth/Throat: Oropharynx is clear and moist. No oropharyngeal exudate.    Eyes: Conjunctivae and EOM are normal. Pupils are equal, round, and reactive to light.  Neck: Normal range of motion. Neck supple. No JVD present. No thyromegaly present.  Cardiovascular: Normal rate, regular rhythm and normal heart sounds.   Pulmonary/Chest: Effort normal and breath sounds normal.  Abdominal: Soft. Bowel sounds are normal. She exhibits no distension. There is no tenderness.  Musculoskeletal: Normal range of motion.  Lymphadenopathy:    She has no cervical adenopathy.  Neurological: She is alert and oriented to person, place, and time. No sensory deficit. She exhibits normal muscle tone.  Skin: Skin is warm and dry. Capillary refill takes less than 2 seconds. No rash noted.  Psychiatric: She has a normal mood and affect. Her behavior is normal.  Vitals reviewed.    ASSESSMENT & PLAN: Brandi Gomez  was seen today for follow-up.  Diagnoses and all orders for this visit:  Acute bronchitis, unspecified organism -     methylPREDNISolone sodium succinate (SOLU-MEDROL) 125 mg/2 mL injection 125 mg; Inject 2 mLs (125 mg total) into the muscle once. -     DG Chest 2 View; Future    Patient Instructions    Continue medications as prescribed today in the ER.   IF you received an x-ray today, you will receive an invoice from [facility]. Please contact [facility] at [phone] with questions or concerns regarding your invoice.   IF you received labwork today, you will receive an invoice from [place]. Please contact [facility] at [phone] with questions or concerns regarding your invoice.   Our billing staff will not be able to assist you with questions regarding bills from these companies.  You will be contacted with the lab results as soon as they are available. The fastest way to get your results is to activate your My Chart account. Instructions are located on the last page of this paperwork. If you have not heard from [place] regarding the results in 2  weeks, please contact this office.      Acute Bronchitis, Adult Acute bronchitis is when air tubes (bronchi) in the lungs suddenly get swollen. The condition can make it hard to breathe. It can also cause these symptoms:  A cough.  Coughing up clear, yellow, or green mucus.  Wheezing.  Chest congestion.  Shortness of breath.  A fever.  Body aches.  Chills.  A sore throat. Follow these instructions at home: Medicines   Take over-the-counter and prescription medicines only as told by your doctor.  If you were prescribed an antibiotic medicine, take it as told by your doctor. Do not stop taking the antibiotic even if you start to feel better. General instructions   Rest.  Drink enough fluids to keep your pee (urine) clear or pale yellow.  Avoid smoking and secondhand smoke. If you smoke and you need help quitting, ask your doctor. Quitting will help your lungs heal faster.  Use an inhaler, cool mist vaporizer, or humidifier as told by your doctor.  Keep all follow-up visits as told by your doctor. This is important. How is this prevented? To lower your risk of getting this condition again:  Wash your hands often with soap and water. If you cannot use soap and water, use hand sanitizer.  Avoid contact with people who have cold symptoms.  Try not to touch your hands to your mouth, nose, or eyes.  Make sure to get the flu shot every year. Contact a doctor if:  Your symptoms do not get better in 2 weeks. Get help right away if:  You cough up blood.  You have chest pain.  You have very bad shortness of breath.  You become dehydrated.  You faint (pass out) or keep feeling like you are going to pass out.  You keep throwing up (vomiting).  You have a very bad headache.  Your fever or chills gets worse. This information is not intended to replace advice given to you by your health care provider. Make sure you discuss any questions you have with your health  care provider. Document Released: 05/13/2008 Document Revised: 07/03/2016 Document Reviewed: 05/15/2016 Elsevier Interactive Patient Education  2017 [employer]      Agustina Caroli, MD [facility]

## 2017-03-19 NOTE — Discharge Instructions (Signed)
Use 2 puffs of an albuterol inhaler every 4-6 hours as needed for cough and shortness of breath. Take prednisone as prescribed. You may use Tessalon for cough as needed. Follow-up with your primary care doctor regarding your visit today.

## 2017-03-20 ENCOUNTER — Ambulatory Visit: Payer: Commercial Managed Care - HMO | Admitting: Neurology

## 2017-03-24 ENCOUNTER — Other Ambulatory Visit: Payer: Self-pay | Admitting: Neurology

## 2017-03-25 ENCOUNTER — Ambulatory Visit: Payer: Commercial Managed Care - HMO | Admitting: Neurology

## 2017-03-25 ENCOUNTER — Telehealth: Payer: Self-pay

## 2017-03-25 NOTE — Telephone Encounter (Signed)
I called pt to get her appt from this morning r/s since we were closed. No answer, left a message asking her to call me back. If pt calls back, please get her rescheduled for another office visit with Dr. Rexene Alberts.

## 2017-04-01 NOTE — Telephone Encounter (Signed)
I called pt again to get her appt rescheduled with Dr. Rexene Alberts. No answer, left a message asking her to call us back. If pt calls back, please get her rescheduled with Dr. Rexene Alberts.

## 2017-04-20 ENCOUNTER — Other Ambulatory Visit: Payer: Self-pay | Admitting: Neurology

## 2017-04-21 ENCOUNTER — Other Ambulatory Visit: Payer: Self-pay

## 2017-04-21 MED ORDER — PHENYTOIN SODIUM EXTENDED 100 MG PO CAPS: ORAL_CAPSULE | ORAL | 2 refills | Status: DC

## 2017-05-01 ENCOUNTER — Telehealth: Payer: Self-pay

## 2017-05-01 NOTE — Telephone Encounter (Signed)
Left message to schedule Medicare Annual Wellness Visit -nr

## 2017-05-08 ENCOUNTER — Telehealth: Payer: Self-pay | Admitting: Neurology

## 2017-05-08 ENCOUNTER — Ambulatory Visit (INDEPENDENT_AMBULATORY_CARE_PROVIDER_SITE_OTHER): Payer: Medicare HMO | Admitting: Emergency Medicine

## 2017-05-08 ENCOUNTER — Encounter: Payer: Self-pay | Admitting: Emergency Medicine

## 2017-05-08 VITALS — BP 122/84 | HR 79 | Temp 98.5°F | Resp 16 | Ht 60.25 in | Wt 269.8 lb

## 2017-05-08 DIAGNOSIS — N390 Urinary tract infection, site not specified: Secondary | ICD-10-CM

## 2017-05-08 DIAGNOSIS — R8290 Unspecified abnormal findings in urine: Secondary | ICD-10-CM | POA: Diagnosis not present

## 2017-05-08 DIAGNOSIS — H9201 Otalgia, right ear: Secondary | ICD-10-CM

## 2017-05-08 DIAGNOSIS — I10 Essential (primary) hypertension: Secondary | ICD-10-CM

## 2017-05-08 DIAGNOSIS — R109 Unspecified abdominal pain: Secondary | ICD-10-CM | POA: Diagnosis not present

## 2017-05-08 DIAGNOSIS — G8929 Other chronic pain: Secondary | ICD-10-CM

## 2017-05-08 DIAGNOSIS — E119 Type 2 diabetes mellitus without complications: Secondary | ICD-10-CM | POA: Diagnosis not present

## 2017-05-08 LAB — POCT URINALYSIS DIP (MANUAL ENTRY)
GLUCOSE UA: NEGATIVE mg/dL
Ketones, POC UA: NEGATIVE mg/dL
Leukocytes, UA: NEGATIVE
NITRITE UA: POSITIVE — AB
Protein Ur, POC: 30 mg/dL — AB
RBC UA: NEGATIVE
UROBILINOGEN UA: 0.2 U/dL
pH, UA: 5.5 (ref 5.0–8.0)

## 2017-05-08 MED ORDER — SULFAMETHOXAZOLE-TRIMETHOPRIM 800-160 MG PO TABS: 1.0000 | ORAL_TABLET | Freq: Two times a day (BID) | ORAL | 0 refills | Status: AC

## 2017-05-08 MED ORDER — TORSEMIDE 20 MG PO TABS: 20.0000 mg | ORAL_TABLET | Freq: Every day | ORAL | 1 refills | Status: DC

## 2017-05-08 MED ORDER — SITAGLIPTIN PHOSPHATE 50 MG PO TABS: 50.0000 mg | ORAL_TABLET | Freq: Every day | ORAL | 1 refills | Status: DC

## 2017-05-08 MED ORDER — DICLOFENAC SODIUM 75 MG PO TBEC: 75.0000 mg | DELAYED_RELEASE_TABLET | Freq: Two times a day (BID) | ORAL | 0 refills | Status: AC

## 2017-05-08 MED ORDER — PREDNISONE 5 MG PO TABS: ORAL_TABLET | ORAL | 0 refills | Status: DC

## 2017-05-08 NOTE — Telephone Encounter (Signed)
Patient called office in reference to having right sided ear pain radiating to the side of her head behind her ear.  Also down the shoulder, arm and goes to her leg to foot.  Patient states she has a headache also symptoms have been going on for 2 days.   Patient did see PCP today with results of no ear infection.

## 2017-05-08 NOTE — Patient Instructions (Addendum)
IF you received an x-ray today, you will receive an invoice from [facility]iology. Please contact [facility] Radiology at [phone] with questions or concerns regarding your invoice.   IF you received labwork today, you will receive an invoice from [facility]. Please contact [facility] at [phone] with questions or concerns regarding your invoice.   Our billing staff will not be able to assist you with questions regarding bills from these companies.  You will be contacted with the lab results as soon as they are available. The fastest way to get your results is to activate your My Chart account. Instructions are located on the last page of this paperwork. If you have not heard from [place] regarding the results in 2 weeks, please contact this office.    We recommend that you schedule a mammogram for breast cancer screening. Typically, you do not need a referral to do this. Please contact a local imaging center to schedule your mammogram.  [facility] - [phone]  *ask for the Radiology Department [facility] [place] ([place]) - [phone] or [phone]  [facility] - [phone] [place] [phone] [facility] - [phone]  *ask for the [facility] - [phone]  *ask for the Radiology Department [facility] - [phone]  *ask for the Mammography Department [facility] - [phone] Urinary Tract Infection, Adult A urinary tract infection (UTI) is an infection of any part of the urinary tract. The urinary tract includes the:  Kidneys.  Ureters.  Bladder.  Urethra.  These organs make, store, and get rid of pee (urine) in the body. Follow these instructions at home:  Take over-the-counter and prescription medicines only as told by your doctor.  If you were prescribed an antibiotic medicine, take it as told by your doctor. Do not stop  taking the antibiotic even if you start to feel better.  Avoid the following drinks: ? Alcohol. ? Caffeine. ? Tea. ? Carbonated drinks.  Drink enough fluid to keep your pee clear or pale yellow.  Keep all follow-up visits as told by your doctor. This is important.  Make sure to: ? Empty your bladder often and completely. Do not to hold pee for long periods of time. ? Empty your bladder before and after sex. ? Wipe from front to back after a bowel movement if you are female. Use each tissue one time when you wipe. Contact a doctor if:  You have back pain.  You have a fever.  You feel sick to your stomach (nauseous).  You throw up (vomit).  Your symptoms do not get better after 3 days.  Your symptoms go away and then come back. Get help right away if:  You have very bad back pain.  You have very bad lower belly (abdominal) pain.  You are throwing up and cannot keep down any medicines or water. This information is not intended to replace advice given to you by your health care provider. Make sure you discuss any questions you have with your health care provider. Document Released: 05/13/2008 Document Revised: 05/02/2016 Document Reviewed: 10/16/2015 Elsevier Interactive Patient Education  2018 [place], Adult An earache, or ear pain, can be caused by many things, including:  An infection.  Ear wax buildup.  Ear pressure.  Something in the ear that should not be there (foreign body).  A sore throat.  Tooth problems.  Jaw problems.  Treatment of  the earache will depend on the cause. If the cause is not clear or cannot be determined, you may need to watch your symptoms until your earache goes away or until a cause is found. Follow these instructions at home: Pay attention to any changes in your symptoms. Take these actions to help with your pain:  Take or apply over-the-counter and prescription medicines only as told by your health care  provider.  If you were prescribed an antibiotic medicine, use it as told by your health care provider. Do not stop using the antibiotic even if you start to feel better.  Do not put anything in your ear other than medicine that is prescribed by your health care provider.  If directed, apply heat to the affected area as often as told by your health care provider. Use the heat source that your health care provider recommends, such as a moist heat pack or a heating pad. ? Place a towel between your skin and the heat source. ? Leave the heat on for 20-30 minutes. ? Remove the heat if your skin turns bright red. This is especially important if you are unable to feel pain, heat, or cold. You may have a greater risk of getting burned.  If directed, put ice on the ear: ? Put ice in a plastic bag. ? Place a towel between your skin and the bag. ? Leave the ice on for 20 minutes, 2-3 times a day.  Try resting in an upright position instead of lying down. This may help to reduce pressure in your ear and relieve pain.  Chew gum if it helps to relieve your ear pain.  Treat any allergies as told by your health care provider.  Keep all follow-up visits as told by your health care provider. This is important.  Contact a health care provider if:  Your pain does not improve within 2 days.  Your earache gets worse.  You have new symptoms.  You have a fever. Get help right away if:  You have a severe headache.  You have a stiff neck.  You have trouble swallowing.  You have redness or swelling behind your ear.  You have fluid or blood coming from your ear.  You have hearing loss.  You feel dizzy. This information is not intended to replace advice given to you by your health care provider. Make sure you discuss any questions you have with your health care provider. Document Released: 07/12/2004 Document Revised: 07/23/2016 Document Reviewed: 05/20/2016 Elsevier Interactive Patient  Education  [employer].

## 2017-05-08 NOTE — Telephone Encounter (Signed)
I called pt again to get her rescheduled for and OV with Dr. Rexene Alberts. No answer, left a message asking her to call me back to reschedule.

## 2017-05-08 NOTE — Telephone Encounter (Signed)
Noted, thanks for trying, if she calls back, get her scheduled for an office visit.

## 2017-05-08 NOTE — Telephone Encounter (Signed)
I called the patient. Within the last 2 days she has started to have sharp shooting pains in the left ear, back of the head, and going down the entire right side the body to include arm and leg. She believes that the right arm is somewhat weak.  I will call in a prednisone Dosepak for her, her primary doctor gave her a prescription for diclofenac, she will hold off on taking that until she finishes the prednisone.  The patient will be called about getting a work in revisit.

## 2017-05-08 NOTE — Progress Notes (Signed)
Brandi Gomez 46 y.o.   Chief Complaint  Patient presents with  . Ear Pain    RIGHT- PER PATIENT STARTING AND DOWN RIGHT SIDE OF BODY x 2 days   . Medication Refill    januvia and torsemide    HISTORY OF PRESENT ILLNESS: This is a 46 y.o. female complaining of pain to right ear that radiates down the face into neck and right side of her body; denies trauma or any other associated symptoms. Also c/o foul smelling urine.  HPI   Prior to Admission medications   Medication Sig Start Date End Date Taking? Authorizing Provider  ibuprofen (ADVIL,MOTRIN) 600 MG tablet Take 1 tablet (600 mg total) by mouth 3 (three) times daily. 11/22/16  Yes Weber, Sarah L, PA-C  levETIRAcetam (KEPPRA) 500 MG tablet Take 1 tablet (500 mg total) by mouth 2 (two) times daily. 02/19/17  Yes [name], MD  phenytoin (DILANTIN) 100 MG ER capsule TAKE 2 CAPSULES IN THE MORNING AND 3 CAPSULES IN THE EVENING 04/21/17  Yes [name], MD  chlorhexidine ([place]) 0.12 % solution  11/05/16   [provider]  glucose blood test strip Test blood sugar daily. Dx E11.9 Patient not taking: Reported on 03/14/2017 09/20/15   [name], MD  glucose monitoring kit (FREESTYLE) monitoring kit Test blood sugar daily. Dx E11.9 Patient not taking: Reported on 03/14/2017 09/20/15   [name], MD  Lancets MISC Test blood sugar daily. Dx E11.9 Patient not taking: Reported on 03/14/2017 09/20/15   [name], MD  predniSONE (DELTASONE) 20 MG tablet Take 2 tablets (40 mg total) by mouth daily. Patient not taking: Reported on 03/19/2017 03/19/17   Antonietta Breach, PA-C  sitaGLIPtin (JANUVIA) 50 MG tablet Take 1 tablet (50 mg total) by mouth daily. For control of blood sugar 05/08/17   Izak Anding, Ines Bloomer, MD  torsemide (DEMADEX) 20 MG tablet Take 1 tablet (20 mg total) by mouth daily. 05/08/17   Horald Pollen, MD    Allergies  Allergen Reactions  . Amoxicillin Itching  . Hydrocodone Other  (See Comments)    Depressed     Patient Active Problem List   Diagnosis Date Noted  . Body aches 03/14/2017  . Flu-like symptoms 03/14/2017  . Acute bronchitis 03/14/2017  . Elevated alkaline phosphatase level 11/13/2016  . Common migraine 05/19/2015  . OSA (obstructive sleep apnea) 03/09/2013  . Encounter for therapeutic drug monitoring 10/16/2012  . Generalized anxiety disorder 06/23/2012  . Essential hypertension 07/27/2008  . HEADACHE 12/11/2007  . ANKLE EDEMA 05/08/2007  . OBESITY, NOS 02/05/2007  . DEPRESSION, MAJOR, RECURRENT 02/05/2007  . Convulsions ([place]) 02/05/2007  . APNEA, SLEEP 02/05/2007    Past Medical History:  Diagnosis Date  . Anemia   . Anxiety   . Bipolar 1 disorder ([place])   . Common migraine 05/19/2015  . Depression   . Hypertension   . Mild mental retardation   . Obesity   . Partial complex seizure disorder with intractable epilepsy ([place]) 05/12/2014  . Seizures ([place])    intractable  . Sleep apnea   . Type II or unspecified type diabetes mellitus without mention of complication, not stated as uncontrolled     Past Surgical History:  Procedure Laterality Date  . COLONOSCOPY     2012-normal , Dr Sharlett Iles  . ESOPHAGOGASTRODUODENOSCOPY     normal-Dr Patterson 2012  . MYRINGOTOMY WITH TUBE PLACEMENT    . NASAL SINUS SURGERY      Social History  Social History  . Marital status: Single    Spouse name: N/A  . Number of children: 3  . Years of education: 12   Occupational History  . disabled   .  Disabled   Social History Main Topics  . Smoking status: Never Smoker  . Smokeless tobacco: Never Used  . Alcohol use No  . Drug use: No  . Sexual activity: Not on file   Other Topics Concern  . Not on file   Social History Narrative   Patient lives at home with daughter.    Patient has 3 children.    Patient is right handed.    Patient has a high school education.    Patient is on disability   Patient drinks 2 cups of caffeine daily.      Family History  Problem Relation Age of Onset  . Diabetes Mother        passed away from accidental death  . Mental illness Father   . Diabetes Daughter   . Cancer Daughter        leukemia  . Cancer Maternal Aunt        ovarian ca     Review of Systems  Constitutional: Negative.  Negative for chills, fever and malaise/fatigue.  HENT: Positive for ear pain. Negative for congestion, ear discharge, hearing loss, nosebleeds, sinus pain, sore throat and tinnitus.   Eyes: Negative for blurred vision, double vision, discharge and redness.  Respiratory: Negative for cough and shortness of breath.   Cardiovascular: Negative for chest pain, palpitations and leg swelling.  Gastrointestinal: Positive for abdominal pain (chronic). Negative for diarrhea, nausea and vomiting.  Genitourinary: Positive for dysuria, frequency and urgency.  Musculoskeletal: Negative for myalgias and neck pain.  Skin: Negative for rash.  Neurological: Negative for dizziness, sensory change, focal weakness and headaches.  Endo/Heme/Allergies: Negative.   All other systems reviewed and are negative.   Vitals:   05/08/17 1208  BP: 122/84  Pulse: 79  Resp: 16  Temp: 98.5 F (36.9 C)    Physical Exam  Constitutional: She is oriented to person, place, and time. She appears well-developed.  Obese.  HENT:  Head: Normocephalic and atraumatic.  Right Ear: Hearing, tympanic membrane, external ear and ear canal normal.  Left Ear: Hearing, tympanic membrane, external ear and ear canal normal.  Nose: Nose normal.  Mouth/Throat: Oropharynx is clear and moist. No oropharyngeal exudate.  Eyes: Conjunctivae and EOM are normal. Pupils are equal, round, and reactive to light.  Neck: Normal range of motion. Neck supple. No JVD present. No thyromegaly present.  Cardiovascular: Normal rate, regular rhythm, normal heart sounds and intact distal pulses.   Pulmonary/Chest: Effort normal and breath sounds normal.   Abdominal: Soft. Bowel sounds are normal. She exhibits no distension and no mass. There is no tenderness. There is no rebound and no guarding.  Musculoskeletal: Normal range of motion.  Lymphadenopathy:    She has no cervical adenopathy.  Neurological: She is alert and oriented to person, place, and time. No sensory deficit. She exhibits normal muscle tone.  Skin: Skin is warm and dry. Capillary refill takes less than 2 seconds. No rash noted.  Psychiatric: She has a normal mood and affect. Her behavior is normal.  Vitals reviewed.    ASSESSMENT & PLAN: Kathrin was seen today for ear pain and medication refill.  Diagnoses and all orders for this visit:  Acute otalgia, right -     diclofenac (VOLTAREN) 75 MG EC tablet; Take 1  tablet (75 mg total) by mouth 2 (two) times daily.  Essential hypertension -     torsemide (DEMADEX) 20 MG tablet; Take 1 tablet (20 mg total) by mouth daily.  Controlled type 2 diabetes mellitus without complication, without long-term current use of insulin (HCC) -     sitaGLIPtin (JANUVIA) 50 MG tablet; Take 1 tablet (50 mg total) by mouth daily. For control of blood sugar  Foul smelling urine -     POCT urinalysis dipstick -     Urine culture  Acute UTI -     sulfamethoxazole-trimethoprim (BACTRIM DS,SEPTRA DS) 800-160 MG tablet; Take 1 tablet by mouth 2 (two) times daily.  Chronic abdominal pain -     Ambulatory referral to Gastroenterology     Patient Instructions       IF you received an x-ray today, you will receive an invoice from [facility] Radiology. Please contact [facility] Radiology at [phone] with questions or concerns regarding your invoice.   IF you received labwork today, you will receive an invoice from [facility]. Please contact [facility] at [phone] with questions or concerns regarding your invoice.   Our billing staff will not be able to assist you with questions regarding bills from these companies.  You will be  contacted with the lab results as soon as they are available. The fastest way to get your results is to activate your My Chart account. Instructions are located on the last page of this paperwork. If you have not heard from [place] regarding the results in 2 weeks, please contact this office.    We recommend that you schedule a mammogram for breast cancer screening. Typically, you do not need a referral to do this. Please contact a local imaging center to schedule your mammogram.  [facility] - [phone]  *ask for the Radiology Department [facility] [place] ([place]) - [phone] or [phone]  [facility] - [phone] [place] [phone] [facility] - [phone]  *ask for the [facility] - [phone]  *ask for the Radiology Department [facility] - [phone]  *ask for the Mammography Department [facility] - [phone] Urinary Tract Infection, Adult A urinary tract infection (UTI) is an infection of any part of the urinary tract. The urinary tract includes the:  Kidneys.  Ureters.  Bladder.  Urethra.  These organs make, store, and get rid of pee (urine) in the body. Follow these instructions at home:  Take over-the-counter and prescription medicines only as told by your doctor.  If you were prescribed an antibiotic medicine, take it as told by your doctor. Do not stop taking the antibiotic even if you start to feel better.  Avoid the following drinks: ? Alcohol. ? Caffeine. ? Tea. ? Carbonated drinks.  Drink enough fluid to keep your pee clear or pale yellow.  Keep all follow-up visits as told by your doctor. This is important.  Make sure to: ? Empty your bladder often and completely. Do not to hold pee for long periods of time. ? Empty your bladder before and after sex. ? Wipe from front to back after a bowel movement if you are  female. Use each tissue one time when you wipe. Contact a doctor if:  You have back pain.  You have a fever.  You feel sick to your stomach (nauseous).  You throw up (vomit).  Your symptoms do not get better after 3 days.  Your  symptoms go away and then come back. Get help right away if:  You have very bad back pain.  You have very bad lower belly (abdominal) pain.  You are throwing up and cannot keep down any medicines or water. This information is not intended to replace advice given to you by your health care provider. Make sure you discuss any questions you have with your health care provider. Document Released: 05/13/2008 Document Revised: 05/02/2016 Document Reviewed: 10/16/2015 Elsevier Interactive Patient Education  2018 [place], Adult An earache, or ear pain, can be caused by many things, including:  An infection.  Ear wax buildup.  Ear pressure.  Something in the ear that should not be there (foreign body).  A sore throat.  Tooth problems.  Jaw problems.  Treatment of the earache will depend on the cause. If the cause is not clear or cannot be determined, you may need to watch your symptoms until your earache goes away or until a cause is found. Follow these instructions at home: Pay attention to any changes in your symptoms. Take these actions to help with your pain:  Take or apply over-the-counter and prescription medicines only as told by your health care provider.  If you were prescribed an antibiotic medicine, use it as told by your health care provider. Do not stop using the antibiotic even if you start to feel better.  Do not put anything in your ear other than medicine that is prescribed by your health care provider.  If directed, apply heat to the affected area as often as told by your health care provider. Use the heat source that your health care provider recommends, such as a moist heat pack or a heating pad. ? Place a towel  between your skin and the heat source. ? Leave the heat on for 20-30 minutes. ? Remove the heat if your skin turns bright red. This is especially important if you are unable to feel pain, heat, or cold. You may have a greater risk of getting burned.  If directed, put ice on the ear: ? Put ice in a plastic bag. ? Place a towel between your skin and the bag. ? Leave the ice on for 20 minutes, 2-3 times a day.  Try resting in an upright position instead of lying down. This may help to reduce pressure in your ear and relieve pain.  Chew gum if it helps to relieve your ear pain.  Treat any allergies as told by your health care provider.  Keep all follow-up visits as told by your health care provider. This is important.  Contact a health care provider if:  Your pain does not improve within 2 days.  Your earache gets worse.  You have new symptoms.  You have a fever. Get help right away if:  You have a severe headache.  You have a stiff neck.  You have trouble swallowing.  You have redness or swelling behind your ear.  You have fluid or blood coming from your ear.  You have hearing loss.  You feel dizzy. This information is not intended to replace advice given to you by your health care provider. Make sure you discuss any questions you have with your health care provider. Document Released: 07/12/2004 Document Revised: 07/23/2016 Document Reviewed: 05/20/2016 Elsevier Interactive Patient Education  2018 [employer].     Agustina Caroli, MD [facility]

## 2017-05-08 NOTE — Telephone Encounter (Signed)
Sheena,  Will you please call this pt and get her rescheduled for an office visit with Dr. Rexene Alberts that was missed due to the bad weather in April?  Thank you!

## 2017-05-08 NOTE — Addendum Note (Signed)
Addended by: [name] on: 05/08/2017 05:11 PM   Modules accepted: Orders

## 2017-05-09 NOTE — Telephone Encounter (Signed)
Called and spoke with patient. Offered Monday 6/4 at 730am, pt declined stating she had another appt. Offered 05/13/17 at 12pm, pt declined, has appt conflict.   Scheduled 05/13/17 at 730am, check in 715am. Pt verbalized understanding.

## 2017-05-11 LAB — URINE CULTURE

## 2017-05-13 ENCOUNTER — Ambulatory Visit: Payer: Self-pay | Admitting: Neurology

## 2017-05-14 ENCOUNTER — Telehealth: Payer: Self-pay | Admitting: Neurology

## 2017-05-14 ENCOUNTER — Telehealth: Payer: Self-pay | Admitting: *Deleted

## 2017-05-14 ENCOUNTER — Encounter: Payer: Self-pay | Admitting: Neurology

## 2017-05-14 NOTE — Telephone Encounter (Signed)
Called pt. She no showed appt yesterday. Scheduled appt for 05/19/17 at 9am, check in 830am. She verbalized understanding.  cx appt for 05/30/17.

## 2017-05-14 NOTE — Telephone Encounter (Signed)
This patient did not show for a revisit appointment on 05/13/17.

## 2017-05-19 ENCOUNTER — Ambulatory Visit (INDEPENDENT_AMBULATORY_CARE_PROVIDER_SITE_OTHER): Payer: Medicare HMO | Admitting: Neurology

## 2017-05-19 ENCOUNTER — Encounter: Payer: Self-pay | Admitting: Neurology

## 2017-05-19 ENCOUNTER — Encounter: Payer: Self-pay | Admitting: Gastroenterology

## 2017-05-19 VITALS — BP 139/90 | HR 67 | Ht 60.25 in | Wt 272.0 lb

## 2017-05-19 DIAGNOSIS — R569 Unspecified convulsions: Secondary | ICD-10-CM | POA: Diagnosis not present

## 2017-05-19 DIAGNOSIS — G4489 Other headache syndrome: Secondary | ICD-10-CM

## 2017-05-19 NOTE — Progress Notes (Signed)
Reason for visit: History of seizures  Brandi Gomez is an 46 y.o. female  History of present illness:  Brandi Gomez is a 46 year old right-handed black female with a history of sleep apnea, seizures, and migraine headache. The patient comes into the office today for new problem. She began having right occipital and ear discomfort that began about 3 or 4 weeks ago. Over the last 1 week, she has not had any discomfort. The patient indicates that the episodes are associated with sharp pain going into the ear and back of the head, she may have similar pain in the right shoulder and arm with a sensation of weakness of the arm. The patient will have similar sharp pains in the right leg at times, the arm, leg, and head pain occurs independently of one another. The patient has had at least one seizure coming out of sleep 2 weeks ago, she may have missed a dose of medication around that time. The episode was associated with left arm elevation and verbalization, the patient had a blank stare, she was not able to respond. The patient has been tapered down on the Dilantin because of gum hypertrophy. She is only getting 250 mg of Keppra twice daily. The patient returns for an evaluation. She has not been on CPAP over the last 3 months that she is not able to get supplies secondary to a lapse of her Medicaid. The Medicaid is now reinstated.  Past Medical History:  Diagnosis Date  . Anemia   . Anxiety   . Bipolar 1 disorder ([place])   . Common migraine 05/19/2015  . Depression   . Hypertension   . Mild mental retardation   . Obesity   . Partial complex seizure disorder with intractable epilepsy ([place]) 05/12/2014  . Seizures ([place])    intractable  . Sleep apnea   . Type II or unspecified type diabetes mellitus without mention of complication, not stated as uncontrolled     Past Surgical History:  Procedure Laterality Date  . COLONOSCOPY     2012-normal , Dr Sharlett Iles  . ESOPHAGOGASTRODUODENOSCOPY       normal-Dr Patterson 2012  . MYRINGOTOMY WITH TUBE PLACEMENT    . NASAL SINUS SURGERY      Family History  Problem Relation Age of Onset  . Diabetes Mother        passed away from accidental death  . Mental illness Father   . Diabetes Daughter   . Cancer Daughter        leukemia  . Cancer Maternal Aunt        ovarian ca    Social history:  reports that she has never smoked. She has never used smokeless tobacco. She reports that she does not drink alcohol or use drugs.    Allergies  Allergen Reactions  . Amoxicillin Itching  . Hydrocodone Other (See Comments)    Depressed     Medications:  Prior to Admission medications   Medication Sig Start Date End Date Taking? Authorizing Provider  chlorhexidine (PERIDEX) 0.12 % solution  11/05/16  Yes [provider]  ibuprofen (ADVIL,MOTRIN) 600 MG tablet Take 1 tablet (600 mg total) by mouth 3 (three) times daily. 11/22/16  Yes Weber, Sarah L, PA-C  levETIRAcetam (KEPPRA) 500 MG tablet Take 1 tablet (500 mg total) by mouth 2 (two) times daily. 02/19/17  Yes [name], MD  phenytoin (DILANTIN) 100 MG ER capsule TAKE 2 CAPSULES IN THE MORNING AND 3 CAPSULES IN THE  EVENING 04/21/17  Yes [name], MD  predniSONE (DELTASONE) 5 MG tablet Begin taking 6 tablets daily, taper by one tablet daily until off the medication. 05/08/17  Yes [name], MD  sitaGLIPtin (JANUVIA) 50 MG tablet Take 1 tablet (50 mg total) by mouth daily. For control of blood sugar 05/08/17  Yes Sagardia, Ines Bloomer, MD  torsemide (DEMADEX) 20 MG tablet Take 1 tablet (20 mg total) by mouth daily. 05/08/17  Yes Sagardia, Ines Bloomer, MD    ROS:  Out of a complete 14 system review of symptoms, the patient complains only of the following symptoms, and all other reviewed systems are negative.  Hearing loss, wheezing Sleep apnea Memory loss  Blood pressure 139/90, pulse 67, height 5' 0.25" (1.53 m), weight 272 lb (123.4 kg).  Physical  Exam  General: The patient is alert and cooperative at the time of the examination. The patient is markedly obese.  Neuromuscular: Range of movement of the cervical spine is full.  Skin: No significant peripheral edema is noted.   Neurologic Exam  Mental status: The patient is alert and oriented x 3 at the time of the examination. The patient has apparent normal recent and remote memory, with an apparently normal attention span and concentration ability.   Cranial nerves: Facial symmetry is present. Speech is normal, no aphasia or dysarthria is noted. Extraocular movements are full. Visual fields are full.  Motor: The patient has good strength in all 4 extremities.  Sensory examination: Soft touch sensation is symmetric on the face, arms, and legs.  Coordination: The patient has good finger-nose-finger and heel-to-shin bilaterally.  Gait and station: The patient has a normal gait. Tandem gait is normal. Romberg is negative. No drift is seen.  Reflexes: Deep tendon reflexes are symmetric.   Assessment/Plan:  1. Episodic right occipital, right arm, right leg pain  2. Seizures with recent recurrence  3. History of sleep apnea on CPAP  4. Reported memory disturbance  The patient has pain in the right occipital area that could be occipital neuralgia, but the patient also reports a sharp pain in the arm and right leg, the etiology of this discomfort is not clear. The patient will undergo MRI evaluation of the brain, she will go up on the [employer] taking 500 mg twice daily and go down the Dilantin taking 100 mg in the morning and 200 mg in the evening. The patient will follow-up in 3 or 4 months. She will contact our office if seizures recur. The patient is no longer having sharp shooting pains.  Jill Alexanders MD 05/19/2017 8:56 AM  [facility] [address] [place], [place] [address]  Phone [phone] Fax [phone]

## 2017-05-19 NOTE — Patient Instructions (Addendum)
   We will go up to 500 mg (whole tablet) twice a day of the Keppra. Reduce the dilantin 100 mg capsules to one in the morning and two in the evening.  We will get MRI of the brain.

## 2017-05-20 ENCOUNTER — Other Ambulatory Visit: Payer: Self-pay | Admitting: Neurology

## 2017-05-20 ENCOUNTER — Encounter (HOSPITAL_COMMUNITY): Payer: Self-pay | Admitting: Emergency Medicine

## 2017-05-20 ENCOUNTER — Telehealth: Payer: Self-pay | Admitting: Neurology

## 2017-05-20 ENCOUNTER — Inpatient Hospital Stay (HOSPITAL_COMMUNITY)
Admission: EM | Admit: 2017-05-20 | Discharge: 2017-05-22 | DRG: 065 | Disposition: A | Discharge status: Home Health | Payer: Medicare HMO | Attending: Internal Medicine | Admitting: Internal Medicine

## 2017-05-20 ENCOUNTER — Emergency Department (HOSPITAL_COMMUNITY): Payer: Medicare HMO

## 2017-05-20 DIAGNOSIS — F319 Bipolar disorder, unspecified: Secondary | ICD-10-CM | POA: Diagnosis present

## 2017-05-20 DIAGNOSIS — Z7982 Long term (current) use of aspirin: Secondary | ICD-10-CM

## 2017-05-20 DIAGNOSIS — Z6841 Body Mass Index (BMI) 40.0 and over, adult: Secondary | ICD-10-CM | POA: Diagnosis not present

## 2017-05-20 DIAGNOSIS — I10 Essential (primary) hypertension: Secondary | ICD-10-CM

## 2017-05-20 DIAGNOSIS — Z833 Family history of diabetes mellitus: Secondary | ICD-10-CM | POA: Diagnosis not present

## 2017-05-20 DIAGNOSIS — R200 Anesthesia of skin: Secondary | ICD-10-CM

## 2017-05-20 DIAGNOSIS — R29702 NIHSS score 2: Secondary | ICD-10-CM | POA: Diagnosis present

## 2017-05-20 DIAGNOSIS — R569 Unspecified convulsions: Secondary | ICD-10-CM | POA: Diagnosis not present

## 2017-05-20 DIAGNOSIS — I360 Nonrheumatic tricuspid (valve) stenosis: Secondary | ICD-10-CM | POA: Diagnosis not present

## 2017-05-20 DIAGNOSIS — E785 Hyperlipidemia, unspecified: Secondary | ICD-10-CM | POA: Diagnosis not present

## 2017-05-20 DIAGNOSIS — Z23 Encounter for immunization: Secondary | ICD-10-CM

## 2017-05-20 DIAGNOSIS — I639 Cerebral infarction, unspecified: Principal | ICD-10-CM | POA: Diagnosis present

## 2017-05-20 DIAGNOSIS — R9431 Abnormal electrocardiogram [ECG] [EKG]: Secondary | ICD-10-CM | POA: Diagnosis not present

## 2017-05-20 DIAGNOSIS — I110 Hypertensive heart disease with heart failure: Secondary | ICD-10-CM | POA: Diagnosis present

## 2017-05-20 DIAGNOSIS — I63541 Cerebral infarction due to unspecified occlusion or stenosis of right cerebellar artery: Secondary | ICD-10-CM | POA: Diagnosis not present

## 2017-05-20 DIAGNOSIS — Z818 Family history of other mental and behavioral disorders: Secondary | ICD-10-CM

## 2017-05-20 DIAGNOSIS — G4733 Obstructive sleep apnea (adult) (pediatric): Secondary | ICD-10-CM

## 2017-05-20 DIAGNOSIS — F70 Mild intellectual disabilities: Secondary | ICD-10-CM | POA: Diagnosis present

## 2017-05-20 DIAGNOSIS — I5042 Chronic combined systolic (congestive) and diastolic (congestive) heart failure: Secondary | ICD-10-CM | POA: Diagnosis not present

## 2017-05-20 DIAGNOSIS — R531 Weakness: Secondary | ICD-10-CM | POA: Diagnosis present

## 2017-05-20 DIAGNOSIS — E119 Type 2 diabetes mellitus without complications: Secondary | ICD-10-CM | POA: Diagnosis not present

## 2017-05-20 DIAGNOSIS — G40909 Epilepsy, unspecified, not intractable, without status epilepticus: Secondary | ICD-10-CM

## 2017-05-20 DIAGNOSIS — G40209 Localization-related (focal) (partial) symptomatic epilepsy and epileptic syndromes with complex partial seizures, not intractable, without status epilepticus: Secondary | ICD-10-CM | POA: Diagnosis present

## 2017-05-20 DIAGNOSIS — I69398 Other sequelae of cerebral infarction: Principal | ICD-10-CM

## 2017-05-20 DIAGNOSIS — Z7984 Long term (current) use of oral hypoglycemic drugs: Secondary | ICD-10-CM | POA: Diagnosis not present

## 2017-05-20 DIAGNOSIS — I69359 Hemiplegia and hemiparesis following cerebral infarction affecting unspecified side: Secondary | ICD-10-CM

## 2017-05-20 DIAGNOSIS — F329 Major depressive disorder, single episode, unspecified: Secondary | ICD-10-CM | POA: Diagnosis not present

## 2017-05-20 DIAGNOSIS — Z806 Family history of leukemia: Secondary | ICD-10-CM | POA: Diagnosis not present

## 2017-05-20 DIAGNOSIS — E1159 Type 2 diabetes mellitus with other circulatory complications: Secondary | ICD-10-CM | POA: Diagnosis present

## 2017-05-20 LAB — CBC WITH DIFFERENTIAL/PLATELET
BASOS ABS: 0 10*3/uL (ref 0.0–0.1)
Basophils Relative: 0 %
EOS ABS: 0.1 10*3/uL (ref 0.0–0.7)
EOS PCT: 2 %
HCT: 34.5 % — ABNORMAL LOW (ref 36.0–46.0)
HEMOGLOBIN: 11.3 g/dL — AB (ref 12.0–15.0)
LYMPHS ABS: 2.7 10*3/uL (ref 0.7–4.0)
LYMPHS PCT: 48 %
MCH: 30.1 pg (ref 26.0–34.0)
MCHC: 32.8 g/dL (ref 30.0–36.0)
MCV: 91.8 fL (ref 78.0–100.0)
Monocytes Absolute: 0.3 10*3/uL (ref 0.1–1.0)
Monocytes Relative: 5 %
NEUTROS PCT: 45 %
Neutro Abs: 2.5 10*3/uL (ref 1.7–7.7)
PLATELETS: 238 10*3/uL (ref 150–400)
RBC: 3.76 MIL/uL — AB (ref 3.87–5.11)
RDW: 13.7 % (ref 11.5–15.5)
WBC: 5.6 10*3/uL (ref 4.0–10.5)

## 2017-05-20 LAB — BASIC METABOLIC PANEL
ANION GAP: 9 (ref 5–15)
BUN: 15 mg/dL (ref 6–20)
CHLORIDE: 105 mmol/L (ref 101–111)
CO2: 24 mmol/L (ref 22–32)
Calcium: 8.4 mg/dL — ABNORMAL LOW (ref 8.9–10.3)
Creatinine, Ser: 0.86 mg/dL (ref 0.44–1.00)
GFR calc Af Amer: >60 mL/min (ref >60)
GFR calc non Af Amer: >60 mL/min (ref >60)
Glucose, Bld: 146 mg/dL — ABNORMAL HIGH (ref 65–99)
POTASSIUM: 3.9 mmol/L (ref 3.5–5.1)
Sodium: 138 mmol/L (ref 135–145)

## 2017-05-20 LAB — I-STAT TROPONIN, ED: TROPONIN I, POC: 0 ng/mL (ref 0.00–0.08)

## 2017-05-20 LAB — GLUCOSE, CAPILLARY: Glucose-Capillary: 148 mg/dL — ABNORMAL HIGH (ref 65–99)

## 2017-05-20 LAB — APTT: APTT: 22 s — AB (ref 24–36)

## 2017-05-20 LAB — PROTIME-INR
INR: 1.06
PROTHROMBIN TIME: 13.8 s (ref 11.4–15.2)

## 2017-05-20 MED ORDER — PREDNISONE 5 MG PO TABS
10.0000 mg | ORAL_TABLET | Freq: Every day | ORAL | Status: AC
  Administered 2017-05-22: 10 mg via ORAL
  Filled 2017-05-20: qty 2

## 2017-05-20 MED ORDER — PREDNISONE 5 MG PO TABS: 5.0000 mg | ORAL_TABLET | Freq: Every day | ORAL | Status: DC

## 2017-05-20 MED ORDER — PHENYTOIN SODIUM EXTENDED 100 MG PO CAPS
200.0000 mg | ORAL_CAPSULE | Freq: Every day | ORAL | Status: DC
  Administered 2017-05-21 – 2017-05-22 (×2): 200 mg via ORAL
  Filled 2017-05-20 (×3): qty 2

## 2017-05-20 MED ORDER — PREDNISONE 5 MG PO TABS
15.0000 mg | ORAL_TABLET | Freq: Every day | ORAL | Status: AC
  Administered 2017-05-21: 15 mg via ORAL
  Filled 2017-05-20: qty 3

## 2017-05-20 MED ORDER — SODIUM CHLORIDE 0.9 % IV SOLN
INTRAVENOUS | Status: DC
  Administered 2017-05-20: 22:00:00 via INTRAVENOUS

## 2017-05-20 MED ORDER — LEVETIRACETAM 500 MG PO TABS
500.0000 mg | ORAL_TABLET | Freq: Two times a day (BID) | ORAL | Status: DC
  Administered 2017-05-20 – 2017-05-22 (×4): 500 mg via ORAL
  Filled 2017-05-20 (×4): qty 1

## 2017-05-20 MED ORDER — ACETAMINOPHEN 650 MG RE SUPP: 650.0000 mg | RECTAL | Status: DC | PRN

## 2017-05-20 MED ORDER — INSULIN ASPART 100 UNIT/ML ~~LOC~~ SOLN
0.0000 [IU] | Freq: Three times a day (TID) | SUBCUTANEOUS | Status: DC
  Administered 2017-05-21 – 2017-05-22 (×2): 1 [IU] via SUBCUTANEOUS
  Administered 2017-05-22 (×2): 2 [IU] via SUBCUTANEOUS

## 2017-05-20 MED ORDER — STROKE: EARLY STAGES OF RECOVERY BOOK
Freq: Once | Status: AC
  Administered 2017-05-20: 21:00:00

## 2017-05-20 MED ORDER — ACETAMINOPHEN 325 MG PO TABS: 650.0000 mg | ORAL_TABLET | ORAL | Status: DC | PRN

## 2017-05-20 MED ORDER — ACETAMINOPHEN 160 MG/5ML PO SOLN: 650.0000 mg | ORAL | Status: DC | PRN

## 2017-05-20 MED ORDER — LINAGLIPTIN 5 MG PO TABS
5.0000 mg | ORAL_TABLET | Freq: Every day | ORAL | Status: DC
  Administered 2017-05-21 – 2017-05-22 (×2): 5 mg via ORAL
  Filled 2017-05-20 (×2): qty 1

## 2017-05-20 MED ORDER — PHENYTOIN SODIUM EXTENDED 100 MG PO CAPS
300.0000 mg | ORAL_CAPSULE | Freq: Every day | ORAL | Status: DC
  Administered 2017-05-20 – 2017-05-21 (×2): 300 mg via ORAL
  Filled 2017-05-20 (×2): qty 3

## 2017-05-20 MED ORDER — TORSEMIDE 20 MG PO TABS: 20.0000 mg | ORAL_TABLET | Freq: Every day | ORAL | Status: DC

## 2017-05-20 NOTE — H&P (Signed)
History and Physical    Brandi Gomez DOB: 01/20/1971 DOA: 05/20/2017  Referring MD/NP/PA: Dr. Leonides Schanz  PCP: Horald Pollen, MD    Patient coming from: home   Chief Complaint: numbness of left arm  HPI: Brandi Gomez is a 46 y.o. female with medical history significant for seizures (on dilantin and keppra), diabetes (on [place]), hypertension who presented to ED with sudden onset numbness or left upper extremity started from finger and hand and radiated upward to her arm. No numbness of her LE. No slurred speech or facial asymmetry. No history of similar symptoms in past. Here in ED, she continues to have numbness, it has not resolved yet. No chest pain, no shortness of breath or palpitations. No fevers, chills. No abdominal pain, nausea or vomiting.   ED Course: In ED, pt was hemodynamically stable. Her BP was 135/93, RR 14-25, afebrile and good oxygen saturation. Blood work was relatively unremarkable. MRI brain showed small acute right perirolandic infarct and mild chronic small vessel ischemic changes. Neurology recommended transfer to [facility] for stroke work up.   Review of Systems:  Constitutional: Negative for fever, chills, diaphoresis, activity change, appetite change and fatigue.  HENT: Negative for ear pain, nosebleeds, congestion, facial swelling, rhinorrhea, neck pain, neck stiffness and ear discharge.   Eyes: Negative for pain, discharge, redness, itching and visual disturbance.  Respiratory: Negative for cough, choking, chest tightness, shortness of breath, wheezing and stridor.   Cardiovascular: Negative for chest pain, palpitations and leg swelling.  Gastrointestinal: Negative for abdominal distention.  Genitourinary: Negative for dysuria, urgency, frequency, hematuria, flank pain, decreased urine volume, difficulty urinating and dyspareunia.  Musculoskeletal: Negative for back pain, joint swelling, arthralgias and gait problem.  Neurological: per  HPI Hematological: Negative for adenopathy. Does not bruise/bleed easily.  Psychiatric/Behavioral: Negative for hallucinations, behavioral problems, confusion, dysphoric mood, decreased concentration and agitation.   Past Medical History:  Diagnosis Date  . Anemia   . Anxiety   . Bipolar 1 disorder ([place])   . Common migraine 05/19/2015  . Depression   . Hypertension   . Mild mental retardation   . Obesity   . Partial complex seizure disorder with intractable epilepsy ([place]) 05/12/2014  . Seizures ([place])    intractable  . Sleep apnea   . Type II or unspecified type diabetes mellitus without mention of complication, not stated as uncontrolled     Past Surgical History:  Procedure Laterality Date  . COLONOSCOPY     2012-normal , Dr Sharlett Iles  . ESOPHAGOGASTRODUODENOSCOPY     normal-Dr Patterson 2012  . MYRINGOTOMY WITH TUBE PLACEMENT    . NASAL SINUS SURGERY      Social history:  reports that she has never smoked. She has never used smokeless tobacco. She reports that she does not drink alcohol or use drugs.  Ambulation: ambulates without assistance at baseline.  Allergies  Allergen Reactions  . Amoxicillin Itching    Has patient had a PCN reaction causing immediate rash, facial/tongue/throat swelling, SOB or lightheadedness with hypotension: yes Has patient had a PCN reaction causing severe rash involving mucus membranes or skin necrosis: no Has patient had a PCN reaction that required hospitalization: no Has patient had a PCN reaction occurring within the last 10 years: yes If all of the above answers are "NO", then may proceed with Cephalosporin use.  Marland Kitchen Hydrocodone Other (See Comments)    Depressed     Family History  Problem Relation Age of Onset  . Diabetes Mother  passed away from accidental death  . Mental illness Father   . Diabetes Daughter   . Cancer Daughter        leukemia  . Cancer Maternal Aunt        ovarian ca    Prior to Admission medications     Medication Sig Start Date End Date Taking? Authorizing Provider  levETIRAcetam (KEPPRA) 500 MG tablet TAKE 1 TABLET BY MOUTH TWICE A DAY 05/20/17  Yes [name], MD  phenytoin (DILANTIN) 100 MG ER capsule TAKE 2 CAPSULES IN THE MORNING AND 3 CAPSULES IN THE EVENING 04/21/17  Yes [name], MD  predniSONE (DELTASONE) 5 MG tablet Begin taking 6 tablets daily, taper by one tablet daily until off the medication. 05/08/17  Yes [name], MD  sitaGLIPtin (JANUVIA) 50 MG tablet Take 1 tablet (50 mg total) by mouth daily. For control of blood sugar 05/08/17  Yes Sagardia, Ines Bloomer, MD  torsemide (DEMADEX) 20 MG tablet Take 1 tablet (20 mg total) by mouth daily. 05/08/17  Yes Sagardia, Ines Bloomer, MD  ibuprofen (ADVIL,MOTRIN) 600 MG tablet Take 1 tablet (600 mg total) by mouth 3 (three) times daily. Patient not taking: Reported on 05/20/2017 11/22/16   Mancel Bale, PA-C    Physical Exam: Vitals:   05/20/17 1330 05/20/17 1400 05/20/17 1611 05/20/17 1613  BP: 124/87 119/86 (!) 144/86   Pulse: 64 63 64   Resp: 16 20 18    Temp:    98.3 F (36.8 C)  TempSrc:      SpO2: 100% 99% 98%   Weight:      Height:        Constitutional: NAD, calm, comfortable Vitals:   05/20/17 1330 05/20/17 1400 05/20/17 1611 05/20/17 1613  BP: 124/87 119/86 (!) 144/86   Pulse: 64 63 64   Resp: 16 20 18    Temp:    98.3 F (36.8 C)  TempSrc:      SpO2: 100% 99% 98%   Weight:      Height:       Eyes: PERRL, lids and conjunctivae normal ENMT: Mucous membranes are moist. Posterior pharynx clear of any exudate or lesions.Normal dentition.  Neck: normal, supple, no masses, no thyromegaly Respiratory: clear to auscultation bilaterally, no wheezing, no crackles. Normal respiratory effort. No accessory muscle use.  Cardiovascular: Regular rate and rhythm, no murmurs / rubs / gallops. No extremity edema. 2+ pedal pulses. No carotid bruits.  Abdomen: no tenderness, no masses palpated. No  hepatosplenomegaly. Bowel sounds positive.  Musculoskeletal: no clubbing / cyanosis. No joint deformity upper and lower extremities. Good ROM, no contractures. Normal muscle tone.  Skin: no rashes, lesions, ulcers. No induration Psychiatric: Normal judgment and insight. Alert and oriented x 3. Normal mood.  Neurologic: General: Mental Status: Alert, oriented, thought content appropriate. Mild dysarthria without evidence of aphasia. Able to follow 3 step commands without difficulty. Cranial Nerves: II: Discs flat bilaterally; Visual fields grossly normal, pupils equal, round, reactive to light and accommodation III,IV, VI: ptosis not present, extra-ocular motions intact bilaterally V,VII: smile symmetric, facial light touch sensation normal bilaterally VIII: hearing normal bilaterally IX,X: uvula rises symmetrically XI: bilateral shoulder shrug XII: midline tongue extension without atrophy or fasciculations  Motor: Right :Upper extremity 5/5Left: Upper extremity 5/5 Lower extremity 5/5Lower extremity 5/5 Tone and bulk:normal tone throughout; no atrophy noted Sensory: Light touch intact throughout, bilaterally Deep Tendon Reflexes:  Right: Upper Extremity Left: Upper extremity   biceps (C-5 to C-6) 2/4 biceps (  C-5 to C-6) 2/4 tricep (C7) 2/4triceps (C7) 2/4 Brachioradialis (C6) 2/4Brachioradialis (C6) 2/4  Lower Extremity Lower Extremity  quadriceps (L-2 to L-4) 2/4 quadriceps (L-2 to L-4) 2/4 Achilles (S1) 2/4Achilles (S1) 2/4  Plantars: Right: downgoingLeft: downgoing Cerebellar: normal finger-to-nose, normal heel-to-shin test    Labs on Admission: I have personally reviewed  following labs and imaging studies  CBC:  Recent Labs Lab 05/20/17 1149  WBC 5.6  NEUTROABS 2.5  HGB 11.3*  HCT 34.5*  MCV 91.8  PLT 409   Basic Metabolic Panel:  Recent Labs Lab 05/20/17 1149  NA 138  K 3.9  CL 105  CO2 24  GLUCOSE 146*  BUN 15  CREATININE 0.86  CALCIUM 8.4*   GFR: Estimated Creatinine Clearance: 100.3 mL/min (by C-G formula based on SCr of 0.86 mg/dL). Liver Function Tests: No results for input(s): AST, ALT, ALKPHOS, BILITOT, PROT, ALBUMIN in the last 168 hours. No results for input(s): LIPASE, AMYLASE in the last 168 hours. No results for input(s): AMMONIA in the last 168 hours. Coagulation Profile:  Recent Labs Lab 05/20/17 1610  INR 1.06   Cardiac Enzymes: No results for input(s): CKTOTAL, CKMB, CKMBINDEX, TROPONINI in the last 168 hours. BNP (last 3 results) No results for input(s): PROBNP in the last 8760 hours. HbA1C: No results for input(s): HGBA1C in the last 72 hours. CBG: No results for input(s): GLUCAP in the last 168 hours. Lipid Profile: No results for input(s): CHOL, HDL, LDLCALC, TRIG, CHOLHDL, LDLDIRECT in the last 72 hours. Thyroid Function Tests: No results for input(s): TSH, T4TOTAL, FREET4, T3FREE, THYROIDAB in the last 72 hours. Anemia Panel: No results for input(s): VITAMINB12, FOLATE, FERRITIN, TIBC, IRON, RETICCTPCT in the last 72 hours.  epsis Labs: @LABRCNTIP (procalcitonin:4,lacticidven:4) )No results found for this or any previous visit (from the past 240 hour(s)).   Radiological Exams on Admission: [name]  Result Date: 05/20/2017 CLINICAL DATA:  Left arm numbness and heaviness. EXAM: MRI HEAD WITHOUT CONTRAST TECHNIQUE: Multiplanar, multiecho pulse sequences of the brain and surrounding structures were obtained without intravenous contrast. COMPARISON:  Head CT 05/15/2015 FINDINGS: Brain: There is a small acute cortical and subcortical infarct involving the perirolandic region on the right. No  intracranial hemorrhage, mass, midline shift, or extra-axial fluid collection is identified. The ventricles are normal in size. There is mild cerebellar atrophy which may be related to patient's history of seizures. Small foci of T2 hyperintensity scattered throughout the cerebral white matter bilaterally are greater than expected for patient's age and nonspecific but compatible with chronic small vessel ischemic disease given patient's risk factors including diabetes. A small focus of cortical laminar necrosis is noted in the right parietal lobe. Vascular: Major intracranial vascular flow voids are preserved. Skull and upper cervical spine: Calvarial thickening and decreased calvarial bone marrow signal, possibly related to anti epileptic medications. Sinuses/Orbits: Unremarkable orbits. Small bilateral mastoid effusions. Small amount of right middle ear fluid as well. Clear paranasal sinuses. Other: None. IMPRESSION: 1. Small acute right perirolandic infarct. 2. Mild chronic small vessel ischemic disease, advanced for age. Electronically Signed   By: Logan Bores M.D.   On: 05/20/2017 15:31    EKG: pending   Assessment/Plan   Principal Problem:   Acute CVA (cerebrovascular accident) ([place]) Stroke work up initiated:  - Aspirin daily - MRI brain - small acute right perirolandic infarct and chronic small vessel ischemic changes  - 2D ECHO - pending  - Carotid doppler - pending  - HgbA1c, Lipid panel - pending.  LDL goal < 100. Patient not on statin therapy  - Diet: NPO until swallow evaluation completed  - Therapy: PT/OT - Appreciate neurology following  Active Problems:   Convulsions ([employer]) - Continue keppra and dilantin - Seizure precaution     Essential hypertension - Resume torsemide     Controlled type 2 diabetes mellitus without complication, without long-term current use of insulin (HCC) - Resume home meds - Added SSI - Check A1c    DVT prophylaxis: SCD's  Code Status: full code   Family Communication: no family at the bedside  Disposition Plan: admission to telemetry, [facility] Consults called: Neurology   Disposition plan: Further plan will depend as patient's clinical course evolves and further radiologic and laboratory data become available.    At the time of admission, it appears that the appropriate admission status for this patient is INPATIENT .Thisis judged to be reasonable and necessary in order to provide the required intensity of service to ensure the patient's safetygiven the patient presentation of acute stroke in addition to physical exam findings, radiographic and laboratory data in the context of chronic comorbidities.   Leisa Lenz MD [facility] Pager [phone]  If 7PM-7AM, please contact night-coverage [email or web address] Password [facility]  05/20/2017, 4:52 PM

## 2017-05-20 NOTE — ED Notes (Signed)
Patient transported to MRI 

## 2017-05-20 NOTE — Telephone Encounter (Signed)
The patient will be admitted to the hospital with a stroke event involving the right brain, I'm not quite sure how this correlates with her complaints of right sided headache, arm pain, and right leg discomfort.  I will cancel my previous order for the MRI of the brain.

## 2017-05-20 NOTE — Telephone Encounter (Signed)
Pt said she woke this morning with lt hand and arm heaviness and index finger is pulled down. She said left arm is "lifeless". Pt said she doesn't know if she had a stroke during the night, she was advised if she thinks she had a stroke to go to ED. Pt said she is going to wait for RN to call.

## 2017-05-20 NOTE — ED Provider Notes (Signed)
[facility] DEPT Provider Note   CSN: [id number] Arrival date & time: 05/20/17  1108     History   Chief Complaint Chief Complaint  Patient presents with  . Numbness    HPI RESHONDA KOERBER is a 46 y.o. female.  The history is provided by the patient and medical records. No language interpreter was used.   ADYLYNN HERTENSTEIN is a 46 y.o. female  with a PMH of seizure disorder, bipolar disorder, migraines, mild MR, HTN, DM who presents to the Emergency Department complaining of left arm numbness and weakness which she noticed upon awakening this morning. Patient states that she felt fine yesterday. This morning, when she awoke, her left arm felt very heavy. She could move it fine, but it felt like dead weight. Numbness of the entire left arm. No neck pain. She also feels as if her fingers are curling up. She has never experienced similar sxs. Family and patient at bedside deny slurred speech, change in mental status or gait abnormalities. No visual changes. She has been experiencing frequent right-sided headaches over the last week, but no headache currently. She actually saw her neurologist yesterday where outpatient brain MR was scheduled. No fever, chills. No recent injury or change in activity. Not on anticoagulants.   Past Medical History:  Diagnosis Date  . Anemia   . Anxiety   . Bipolar 1 disorder ([place])   . Common migraine 05/19/2015  . Depression   . Hypertension   . Mild mental retardation   . Obesity   . Partial complex seizure disorder with intractable epilepsy ([place]) 05/12/2014  . Seizures ([place])    intractable  . Sleep apnea   . Type II or unspecified type diabetes mellitus without mention of complication, not stated as uncontrolled     Patient Active Problem List   Diagnosis Date Noted  . CVA (cerebral vascular accident) ([place]) 05/20/2017  . Controlled type 2 diabetes mellitus without complication, without long-term current use of insulin ([place]) 05/08/2017  .  Acute UTI 05/08/2017  . Chronic abdominal pain 05/08/2017  . Acute otalgia, right 05/08/2017  . Body aches 03/14/2017  . Flu-like symptoms 03/14/2017  . Acute bronchitis 03/14/2017  . Elevated alkaline phosphatase level 11/13/2016  . Common migraine 05/19/2015  . OSA (obstructive sleep apnea) 03/09/2013  . Encounter for therapeutic drug monitoring 10/16/2012  . Generalized anxiety disorder 06/23/2012  . Essential hypertension 07/27/2008  . HEADACHE 12/11/2007  . ANKLE EDEMA 05/08/2007  . OBESITY, NOS 02/05/2007  . DEPRESSION, MAJOR, RECURRENT 02/05/2007  . Convulsions ([place]) 02/05/2007  . APNEA, SLEEP 02/05/2007    Past Surgical History:  Procedure Laterality Date  . COLONOSCOPY     2012-normal , Dr Sharlett Iles  . ESOPHAGOGASTRODUODENOSCOPY     normal-Dr Patterson 2012  . MYRINGOTOMY WITH TUBE PLACEMENT    . NASAL SINUS SURGERY      OB History    No data available       Home Medications    Prior to Admission medications   Medication Sig Start Date End Date Taking? Authorizing Provider  levETIRAcetam (KEPPRA) 500 MG tablet TAKE 1 TABLET BY MOUTH TWICE A DAY 05/20/17  Yes [name], MD  phenytoin (DILANTIN) 100 MG ER capsule TAKE 2 CAPSULES IN THE MORNING AND 3 CAPSULES IN THE EVENING 04/21/17  Yes [name], MD  predniSONE (DELTASONE) 5 MG tablet Begin taking 6 tablets daily, taper by one tablet daily until off the medication. 05/08/17  Yes [name], MD  sitaGLIPtin (JANUVIA) 50 MG tablet Take 1 tablet (50 mg total) by mouth daily. For control of blood sugar 05/08/17  Yes Sagardia, Ines Bloomer, MD  torsemide (DEMADEX) 20 MG tablet Take 1 tablet (20 mg total) by mouth daily. 05/08/17  Yes Sagardia, Ines Bloomer, MD  ibuprofen (ADVIL,MOTRIN) 600 MG tablet Take 1 tablet (600 mg total) by mouth 3 (three) times daily. Patient not taking: Reported on 05/20/2017 11/22/16   Mancel Bale, PA-C    Family History Family History  Problem Relation Age of Onset    . Diabetes Mother        passed away from accidental death  . Mental illness Father   . Diabetes Daughter   . Cancer Daughter        leukemia  . Cancer Maternal Aunt        ovarian ca    Social History Social History  Substance Use Topics  . Smoking status: Never Smoker  . Smokeless tobacco: Never Used  . Alcohol use No     Allergies   Amoxicillin and Hydrocodone   Review of Systems Review of Systems  Neurological: Positive for weakness and numbness.  All other systems reviewed and are negative.    Physical Exam Updated Vital Signs BP (!) 144/86 (BP Location: Left Arm)   Pulse 64   Temp 98.3 F (36.8 C)   Resp 18   Ht 5\' 2"  (1.575 m)   Wt 123.4 kg (272 lb)   LMP 05/20/2017   SpO2 98%   BMI 49.75 kg/m   Physical Exam  Constitutional: She is oriented to person, place, and time. She appears well-developed and well-nourished. No distress.  HENT:  Head: Normocephalic and atraumatic.  Cardiovascular: Normal rate, regular rhythm, normal heart sounds and intact distal pulses.   No murmur heard. Pulmonary/Chest: Effort normal and breath sounds normal. No respiratory distress.  Abdominal: Soft. She exhibits no distension. There is no tenderness.  Musculoskeletal:  Bilateral upper extremities with full ROM. 5/5 muscle strength of RUE, 4/5 muscle strength of LUE.  Neurological: She is alert and oriented to person, place, and time.  Alert, oriented, thought content appropriate, able to give a coherent history. Speech is clear and goal oriented, able to follow commands.  Cranial Nerves:  II:  Peripheral visual fields grossly normal, pupils equal, round, reactive to light III, IV, VI: EOM intact bilaterally, ptosis not present V,VII: smile symmetric, eyes kept closed tightly against resistance, facial light touch sensation equal VIII: hearing grossly normal IX, X: symmetric soft palate movement, uvula elevates symmetrically  XI: bilateral shoulder shrug symmetric and  strong XII: midline tongue extension Sensory to light touch diminished in LUE when compared to RUE. + drift, left  Skin: Skin is warm and dry.  Nursing note and vitals reviewed.    ED Treatments / Results  Labs (all labs ordered are listed, but only abnormal results are displayed) Labs Reviewed  CBC WITH DIFFERENTIAL/PLATELET - Abnormal; Notable for the following:       Result Value   RBC 3.76 (*)    Hemoglobin 11.3 (*)    HCT 34.5 (*)    All other components within normal limits  BASIC METABOLIC PANEL - Abnormal; Notable for the following:    Glucose, Bld 146 (*)    Calcium 8.4 (*)    All other components within normal limits  APTT  PROTIME-INR  I-STAT TROPOININ, ED    EKG  EKG Interpretation None       Radiology Mr  Brain Wo Contrast  Result Date: 05/20/2017 CLINICAL DATA:  Left arm numbness and heaviness. EXAM: MRI HEAD WITHOUT CONTRAST TECHNIQUE: Multiplanar, multiecho pulse sequences of the brain and surrounding structures were obtained without intravenous contrast. COMPARISON:  Head CT 05/15/2015 FINDINGS: Brain: There is a small acute cortical and subcortical infarct involving the perirolandic region on the right. No intracranial hemorrhage, mass, midline shift, or extra-axial fluid collection is identified. The ventricles are normal in size. There is mild cerebellar atrophy which may be related to patient's history of seizures. Small foci of T2 hyperintensity scattered throughout the cerebral white matter bilaterally are greater than expected for patient's age and nonspecific but compatible with chronic small vessel ischemic disease given patient's risk factors including diabetes. A small focus of cortical laminar necrosis is noted in the right parietal lobe. Vascular: Major intracranial vascular flow voids are preserved. Skull and upper cervical spine: Calvarial thickening and decreased calvarial bone marrow signal, possibly related to anti epileptic medications.  Sinuses/Orbits: Unremarkable orbits. Small bilateral mastoid effusions. Small amount of right middle ear fluid as well. Clear paranasal sinuses. Other: None. IMPRESSION: 1. Small acute right perirolandic infarct. 2. Mild chronic small vessel ischemic disease, advanced for age. Electronically Signed   By: Logan Bores M.D.   On: 05/20/2017 15:31    Procedures Procedures (including critical care time)  Medications Ordered in ED Medications - No data to display   Initial Impression / Assessment and Plan / ED Course  I have reviewed the triage vital signs and the nursing notes.  Pertinent labs & imaging results that were available during my care of the patient were reviewed by me and considered in my medical decision making (see chart for details).    JOSLIN DOELL is a 46 y.o. female who presents to ED for left arm numbness and weakness which she awoke with this morning. On exam, patient with + drift on left, noticeable muscle weakness and diminished sensation to light touch of LUE when compared to RUE. MRI shows small acute right perirolandic infarct. Discussed case with neurology, Dr. Cheral Marker who recommends admission to [facility] and neuro will follow. Dr. Cheral Marker requests to be called when patient arrives to [facility]. Hospitalist consulted who will admit.   Patient seen by and discussed with Dr. Ashok Cordia who agrees with treatment plan.    Final Clinical Impressions(s) / ED Diagnoses   Final diagnoses:  Left arm numbness  Cerebrovascular accident (CVA), unspecified mechanism [facility])    New Prescriptions New Prescriptions   No medications on file     Clotilda Hafer, Ozella Almond, PA-C 05/20/17 1630    Lajean Saver, MD 05/23/17 1739

## 2017-05-20 NOTE — ED Triage Notes (Signed)
Patient c/o numbness to left arm since waking this morning. Denies headache, blurred vision, chest pain, SOB. Ambulatory to triage.

## 2017-05-20 NOTE — Telephone Encounter (Signed)
Spoke to Dr Jannifer Franklin. Advised pt to proceed to ER. Advised her to have someone bring her. She verbalized understanding and has someone to bring her. She is going to go to [facility]. Advised her to call if she has further questions or concerns.

## 2017-05-20 NOTE — ED Notes (Signed)
Attempted to call report. Receiving nurse unable to take report at this time and is to call back.

## 2017-05-20 NOTE — Progress Notes (Signed)
Patient arrived to unit from [facility] ED. Safety precautions and orders reviewed with patient. TELE applied and confirmed. VSS. Will continue to monitor.  Ave Filter, RN

## 2017-05-20 NOTE — ED Notes (Signed)
Brandi Gomez requesting meal tray was informed of the NPO status.

## 2017-05-20 NOTE — ED Notes (Signed)
Attempted to call report. Charge nurse unable to take report. Charge nurse to call back for report.

## 2017-05-20 NOTE — Telephone Encounter (Signed)
Called and spoke with patient. She stated she  woke up this morning and whole left arm numb and fingers numb. Cannot pick up anything. Just hags by her side. Her fingers are curled. Did not have these sx last night. This is first occurrence. Has not happened before. Just started prednisone last week.  Advised I will speak with Dr Jannifer Franklin and call her back to advise how to proceed. May need to go to ER.

## 2017-05-20 NOTE — ED Notes (Signed)
[facility] called for transportation 

## 2017-05-20 NOTE — ED Notes (Signed)
RN at bedside starting ultrasound IV

## 2017-05-20 NOTE — Telephone Encounter (Signed)
Patient called stating she is at [facility] and will be transported to [facility] because she did have a small stroke on her right side. She wanted to thank you for advising her to go to the hospital.

## 2017-05-21 ENCOUNTER — Inpatient Hospital Stay (HOSPITAL_COMMUNITY): Payer: Medicare HMO

## 2017-05-21 DIAGNOSIS — F329 Major depressive disorder, single episode, unspecified: Secondary | ICD-10-CM

## 2017-05-21 DIAGNOSIS — G4733 Obstructive sleep apnea (adult) (pediatric): Secondary | ICD-10-CM

## 2017-05-21 DIAGNOSIS — I639 Cerebral infarction, unspecified: Secondary | ICD-10-CM

## 2017-05-21 LAB — ANTITHROMBIN III: AntiThromb III Func: 119 % (ref 75–120)

## 2017-05-21 LAB — GLUCOSE, CAPILLARY
GLUCOSE-CAPILLARY: 131 mg/dL — AB (ref 65–99)
GLUCOSE-CAPILLARY: 120 mg/dL — AB (ref 65–99)
Glucose-Capillary: 110 mg/dL — ABNORMAL HIGH (ref 65–99)
Glucose-Capillary: 139 mg/dL — ABNORMAL HIGH (ref 65–99)
Glucose-Capillary: 104 mg/dL — ABNORMAL HIGH (ref 65–99)

## 2017-05-21 LAB — VAS US CAROTID
LCCADSYS: -58 cm/s
LEFT ECA DIAS: -14 cm/s
LEFT VERTEBRAL DIAS: 15 cm/s
LICAPDIAS: -27 cm/s
Left CCA dist dias: -17 cm/s
Left CCA prox dias: 21 cm/s
Left CCA prox sys: 91 cm/s
Left ICA dist dias: -21 cm/s
Left ICA dist sys: -58 cm/s
Left ICA prox sys: -79 cm/s
RCCAPDIAS: 14 cm/s
RIGHT ECA DIAS: -12 cm/s
RIGHT VERTEBRAL DIAS: 12 cm/s
Right CCA prox sys: 83 cm/s
Right cca dist sys: -63 cm/s

## 2017-05-21 LAB — HIV ANTIBODY (ROUTINE TESTING W REFLEX): HIV SCREEN 4TH GENERATION: NONREACTIVE

## 2017-05-21 LAB — LIPID PANEL
CHOL/HDL RATIO: 3.6 ratio
CHOLESTEROL: 158 mg/dL (ref 0–200)
HDL: 44 mg/dL (ref >40)
LDL Cholesterol: 82 mg/dL (ref 0–99)
TRIGLYCERIDES: 161 mg/dL — AB (ref <150)
VLDL: 32 mg/dL (ref 0–40)

## 2017-05-21 MED ORDER — PNEUMOCOCCAL VAC POLYVALENT 25 MCG/0.5ML IJ INJ
0.5000 mL | INJECTION | INTRAMUSCULAR | Status: AC
  Administered 2017-05-22: 0.5 mL via INTRAMUSCULAR
  Filled 2017-05-21: qty 0.5

## 2017-05-21 MED ORDER — ATORVASTATIN CALCIUM 10 MG PO TABS
10.0000 mg | ORAL_TABLET | Freq: Every day | ORAL | Status: DC
  Administered 2017-05-22: 10 mg via ORAL
  Filled 2017-05-21: qty 1

## 2017-05-21 MED ORDER — ASPIRIN 325 MG PO TABS
325.0000 mg | ORAL_TABLET | Freq: Every day | ORAL | Status: DC
  Administered 2017-05-21 – 2017-05-22 (×2): 325 mg via ORAL
  Filled 2017-05-21 (×2): qty 1

## 2017-05-21 NOTE — Evaluation (Signed)
Occupational Therapy Evaluation Patient Details Name: Brandi Gomez MRN: [id number] DOB: 1971/08/09 Today's Date: 05/21/2017    History of Present Illness Pt is a 46 y.o. female who presented to the ED with new onset weakness and numbness in her L UE. MRI revealed R perirolandic acute ischemic infarction. Pt has  PMH significant for anemia, anxiety, bipolar 1 disorder, common migraine, depression, hypertension, mild mental retardation, obesity, partial complex seizure intractable epilepsy, intractable seizures, sleep apnea, type II or unspecified type diabetes mellitus without mention of complication, not stated as uncontrolled.    Clinical Impression   PTA, pt reports independence with basic ADL and functional mobility and requires assistance with IADL from her daughter and fiancee. Pt currently requires min assist for self-feeding and grooming tasks and moderate assistance for UB dressing tasks. She presents with decreased L UE strength and decreased L UE coordination along with decreased dynamic standing balance impacting her ability to participate in ADL at PLOF. Pt would benefit from continued OT services while admitted to improve independence with ADL and functional mobility. Initiated HEP as detailed below with written handout provided. Recommend outpatient OT services for OT follow-up as well as [facility] for safe transportation. OT will continue to follow while admitted.     Follow Up Recommendations  Outpatient OT;Supervision/Assistance - 24 hour    Equipment Recommendations  Tub/shower seat;3 in 1 bedside commode    Recommendations for Other Services       Precautions / Restrictions Precautions Precautions: Fall Restrictions Weight Bearing Restrictions: No      Mobility Bed Mobility Overal bed mobility: Needs Assistance Bed Mobility: Supine to Sit;Sit to Supine     Supine to sit: Supervision Sit to supine: Supervision   General bed mobility comments: Supervision for  safety.   Transfers Overall transfer level: Needs assistance Equipment used: None Transfers: Sit to/from Stand Sit to Stand: Supervision         General transfer comment: Supervision for safety.     Balance Overall balance assessment: Needs assistance Sitting-balance support: No upper extremity supported;Feet supported Sitting balance-Leahy Scale: Good     Standing balance support: Bilateral upper extremity supported;During functional activity Standing balance-Leahy Scale: Fair Standing balance comment: Supervision for dynamic standing activities for safety.                            ADL either performed or assessed with clinical judgement   ADL Overall ADL's : Needs assistance/impaired Eating/Feeding: Sitting;Minimal assistance Eating/Feeding Details (indicate cue type and reason): Min assist for bilateral tasks.  Grooming: Minimal assistance;Sitting Grooming Details (indicate cue type and reason): Min assist for bilateral tasks.  Upper Body Bathing: Minimal assistance;Sitting   Lower Body Bathing: Minimal assistance;Sit to/from stand   Upper Body Dressing : Moderate assistance;Sitting   Lower Body Dressing: Minimal assistance;Sit to/from stand   Toilet Transfer: Supervision/safety;Ambulation   Toileting- Clothing Manipulation and Hygiene: Min guard;Sit to/from stand       Functional mobility during ADLs: Supervision/safety General ADL Comments: Pt limited by decreased strength and coordination in L UE.      Vision Patient Visual Report: No change from baseline Vision Assessment?: Yes Eye Alignment: Within Functional Limits Ocular Range of Motion: Within Functional Limits Alignment/Gaze Preference: Within Defined Limits Tracking/Visual Pursuits: Able to track stimulus in all quads without difficulty Saccades: Within functional limits Convergence: Within functional limits Visual Fields: No apparent deficits     Perception     Praxis  Pertinent Vitals/Pain Pain Assessment: Faces Faces Pain Scale: Hurts a little bit Pain Location: L shoulder with movement; "tightness" Pain Descriptors / Indicators: Tightness Pain Intervention(s): Monitored during session     Hand Dominance Right   Extremity/Trunk Assessment Upper Extremity Assessment Upper Extremity Assessment: LUE deficits/detail LUE Deficits / Details: Shoulder strength WFL. Strength for elbow flexion/extension 4/5, grasp strength 3/5. Signs of dysmetria with finger to nose testing. Decreased coordination with rapidly alternating movements which may indicate disdiadochokinesia. Of note, L index finger and fifth digit with decreased active extension at PIP and hyperextension at MCP and pt reports that this is a new deficit.    Lower Extremity Assessment Lower Extremity Assessment: Defer to PT evaluation       Communication Communication Communication:  (slow and slightly slurred speech)   Cognition Arousal/Alertness: Awake/alert Behavior During Therapy: WFL for tasks assessed/performed Overall Cognitive Status: History of cognitive impairments - at baseline                                 General Comments: Pt with history of decreased short-term memory and decreased problem solving skills.    General Comments       Exercises Exercises: Hand exercises;General Upper Extremity;Other exercises General Exercises - Upper Extremity Shoulder Flexion: AROM;Both;10 reps;Seated Shoulder ABduction: AROM;Both;10 reps;Seated Elbow Flexion: AROM;Left;10 reps;Seated Elbow Extension: AROM;Left;10 reps;Seated Digit Composite Flexion: AROM;Left;10 reps;Seated Composite Extension: AROM;10 reps;Seated;Left Hand Exercises Digit Composite Abduction: AROM;Left;10 reps;Seated Digit Composite Adduction: AROM;Left;10 reps;Seated Digit Lifts: AROM;Left;10 reps;Seated Opposition: AROM;Left;10 reps;Seated Other Exercises Other Exercises: Educated pt on thumb  circles for L thumb for 10 repetitions.    Shoulder Instructions      Home Living Family/patient expects to be discharged to:: Private residence Living Arrangements: Spouse/significant other;Children (daughter and fiancee) Available Help at Discharge: Family   Home Access: Stairs to enter Technical brewer of Steps: 4-5 Entrance Stairs-Rails: Right;Left Home Layout: One level     Bathroom Shower/Tub: Tub/shower unit                    Prior Functioning/Environment Level of Independence: Needs assistance    ADL's / Homemaking Assistance Needed: Reports independence with basic ADL. Assistance required for IADL and home management from family.             OT Problem List: Decreased strength;Decreased range of motion;Impaired balance (sitting and/or standing);Decreased cognition;Decreased safety awareness;Impaired UE functional use;Decreased knowledge of precautions;Decreased knowledge of use of DME or AE;Decreased coordination      OT Treatment/Interventions: Self-care/ADL training;Therapeutic exercise;Neuromuscular education;Energy conservation;DME and/or AE instruction;Therapeutic activities;Patient/family education;Balance training;Cognitive remediation/compensation;Splinting    OT Goals(Current goals can be found in the care plan section) Acute Rehab OT Goals Patient Stated Goal: to get her arm to work OT Goal Formulation: With patient Time For Goal Achievement: 06/04/17 Potential to Achieve Goals: Good ADL Goals Pt Will Perform Grooming: with modified independence;standing Pt Will Perform Upper Body Dressing: with modified independence;sitting Pt Will Perform Lower Body Dressing: with modified independence;sit to/from stand Pt Will Transfer to Toilet: with modified independence;ambulating;regular height toilet Pt Will Perform Toileting - Clothing Manipulation and hygiene: with modified independence;sit to/from stand Pt Will Perform Tub/Shower Transfer: with  supervision;ambulating;shower seat Pt/caregiver will Perform Home Exercise Program: Left upper extremity;With written HEP provided;With Supervision (strength and coordination)  OT Frequency: Min 2X/week   Barriers to D/C:            Co-evaluation  AM-PAC PT "6 Clicks" Daily Activity     Outcome Measure Help from another person eating meals?: A Little Help from another person taking care of personal grooming?: A Little Help from another person toileting, which includes using toliet, bedpan, or urinal?: A Little Help from another person bathing (including washing, rinsing, drying)?: A Little Help from another person to put on and taking off regular upper body clothing?: A Lot Help from another person to put on and taking off regular lower body clothing?: A Little 6 Click Score: 17   End of Session Equipment Utilized During Treatment: Gait belt Nurse Communication: Mobility status  Activity Tolerance: Patient tolerated treatment well Patient left: in bed;with call bell/phone within reach;with bed alarm set  OT Visit Diagnosis: Hemiplegia and hemiparesis Hemiplegia - Right/Left: Left Hemiplegia - dominant/non-dominant: Non-Dominant Hemiplegia - caused by: Cerebral infarction                Time: 0821-0858 OT Time Calculation (min): 37 min Charges:  OT General Charges $OT Visit: 1 Procedure OT Evaluation $OT Eval Moderate Complexity: 1 Procedure OT Treatments $Self Care/Home Management : 8-22 mins G-Codes:     Norman Herrlich, MS OTR/L  Pager: McDonald A Jarron Curley 05/21/2017, 10:25 AM

## 2017-05-21 NOTE — Progress Notes (Signed)
[facility] PROGRESS NOTE  Brandi Gomez OMV:[id number] DOB: Aug 13, 1971 DOA: 05/20/2017 PCP: Horald Pollen, MD  Interim summary and HPI 46 y/o female with PMH significant for HTN, DM type, obesity, OSA, seizure and depression; whopresented with new onset LUE weakness and numbness. Found to have acute right perirolandic stroke.  Assessment/Plan: 1-acute perirolandic infarct -with left side weakness/numbness on her upper extremity  -LDL 82 and A1C pending -echo pending -carotid duplex: w/o significant stenosis  -will continue aspirin for secondary prevention -will follow neurology rec's  2-HTN -stable overall -will be permissive with HTN in acute setting of CVA -heart healthy diet ordered  3-HTLD -started on lipitor -will discharge on statins -LDL goal is < 70  4-type 2 diabetes  -A1C pending  -will continue linagliptin  -adjust hypoglycemic regimen as needed   5-morbid obesity -Body mass index is 49.75 kg/m. -low calorie diet and exercise discussed with patient  6-seizure disorder  -stable overall  -will continue dilantin and keppra   7-OSA -will use CPAP QHS  8-depression: -no SI or hallucinations -currently not taking any antidepressant   Code Status: Full Family Communication: no family at bedside  Disposition Plan: will complete stroke work up. Patient seen by PT and OT and recommending outpatient rehab therapy. Will follow neurology rec's.   Consultants:  Neurology   Procedures:  See below for x-ray reports   Carotid duplex: no significant ICA stenosis (1-39%) bilaterally; vertebral arteries with antegrade flow.  2-D echo: pending   Antibiotics:  None   HPI/Subjective: Afebrile, no CP, no SOB, no nausea, no vomiting and no abd pain. Patient with left side weakness, numbness and poor coordination on exam.  Objective: Vitals:   05/21/17 1718 05/21/17 2100  BP: 126/76 (!) 147/94  Pulse: 67 72  Resp: 16 16  Temp: 98.1 F  (36.7 C) 98.9 F (37.2 C)    Intake/Output Summary (Last 24 hours) at 05/21/17 2303 Last data filed at 05/21/17 1722  Gross per 24 hour  Intake              240 ml  Output                0 ml  Net              240 ml   Filed Weights   05/20/17 1111  Weight: 123.4 kg (272 lb)    Exam:   General:  Afebrile, no CP, no SOB. Patient obese on exam,. Reported still some numbness and left hand weakness.  Cardiovascular: S1 and S2, no rubs, no gallops  Respiratory: good air movement, no wheezing, no crackles  Abdomen: soft, NT, ND, positive BS  Musculoskeletal: no edema, no cyanosis, no clubbing  Neurology: left hand weakness, numbness described and poor coordination. Normal right side and grossly intact CN.  Data Reviewed: Basic Metabolic Panel:  Recent Labs Lab 05/20/17 1149  NA 138  K 3.9  CL 105  CO2 24  GLUCOSE 146*  BUN 15  CREATININE 0.86  CALCIUM 8.4*   CBC:  Recent Labs Lab 05/20/17 1149  WBC 5.6  NEUTROABS 2.5  HGB 11.3*  HCT 34.5*  MCV 91.8  PLT 238   CBG:  Recent Labs Lab 05/21/17 0612 05/21/17 0757 05/21/17 1111 05/21/17 1718 05/21/17 2100  GLUCAP 131* 110* 120* 139* 104*    Studies: [name]  Result Date: 05/20/2017 CLINICAL DATA:  Left arm numbness and heaviness. EXAM: MRI HEAD WITHOUT CONTRAST TECHNIQUE: Multiplanar, multiecho pulse sequences  of the brain and surrounding structures were obtained without intravenous contrast. COMPARISON:  Head CT 05/15/2015 FINDINGS: Brain: There is a small acute cortical and subcortical infarct involving the perirolandic region on the right. No intracranial hemorrhage, mass, midline shift, or extra-axial fluid collection is identified. The ventricles are normal in size. There is mild cerebellar atrophy which may be related to patient's history of seizures. Small foci of T2 hyperintensity scattered throughout the cerebral white matter bilaterally are greater than expected for patient's age  and nonspecific but compatible with chronic small vessel ischemic disease given patient's risk factors including diabetes. A small focus of cortical laminar necrosis is noted in the right parietal lobe. Vascular: Major intracranial vascular flow voids are preserved. Skull and upper cervical spine: Calvarial thickening and decreased calvarial bone marrow signal, possibly related to anti epileptic medications. Sinuses/Orbits: Unremarkable orbits. Small bilateral mastoid effusions. Small amount of right middle ear fluid as well. Clear paranasal sinuses. Other: None. IMPRESSION: 1. Small acute right perirolandic infarct. 2. Mild chronic small vessel ischemic disease, advanced for age. Electronically Signed   By: Logan Bores M.D.   On: 05/20/2017 15:31   Mr Jodene Nam Head/brain LD Cm  Result Date: 05/21/2017 CLINICAL DATA:  Acute onset LEFT extremity numbness. History of seizures, diabetes, hypertension, bipolar disorder, migraines. EXAM: MRA HEAD WITHOUT CONTRAST TECHNIQUE: Angiographic images of the Circle of Willis were obtained using MRA technique without intravenous contrast. COMPARISON:  MRI of the head May 20, 2017 at 1419 hours FINDINGS: ANTERIOR CIRCULATION: Normal flow related enhancement of the included cervical, petrous, cavernous and supraclinoid internal carotid arteries. Patent anterior communicating artery. Normal flow related enhancement of the anterior and middle cerebral arteries, including distal segments. No large vessel occlusion, high-grade stenosis, abnormal luminal irregularity, aneurysm. POSTERIOR CIRCULATION: Codominant vertebral artery's. Basilar artery is patent, with normal flow related enhancement of the main branch vessels. Normal flow related enhancement of the posterior cerebral arteries. No large vessel occlusion, high-grade stenosis, abnormal luminal irregularity, aneurysm. ANATOMIC VARIANTS: None. Source images and MIP images were reviewed. IMPRESSION: Negative MRA head.  Electronically Signed   By: Elon Alas M.D.   On: 05/21/2017 02:14    Scheduled Meds: . aspirin  325 mg Oral Daily  . atorvastatin  10 mg Oral q1800  . insulin aspart  0-9 Units Subcutaneous TID WC  . levETIRAcetam  500 mg Oral BID  . linagliptin  5 mg Oral Daily  . phenytoin  200 mg Oral Daily  . phenytoin  300 mg Oral QHS  . [START ON 05/22/2017] pneumococcal 23 valent vaccine  0.5 mL Intramuscular Tomorrow-1000  . [START ON 05/22/2017] predniSONE  10 mg Oral Q breakfast   Followed by  . [START ON 05/23/2017] predniSONE  5 mg Oral Q breakfast   Continuous Infusions: . sodium chloride 50 mL/hr at 05/20/17 2145    Principal Problem:   Acute CVA (cerebrovascular accident) [facility]) Active Problems:   Convulsions ([place])   Essential hypertension   Controlled type 2 diabetes mellitus without complication, without long-term current use of insulin ([place])    Time spent: 25 minutes    Barton Dubois  [facility] Pager [phone]. If 7PM-7AM, please contact night-coverage at [email or web address], password [facility] 05/21/2017, 11:03 PM  LOS: 1 day

## 2017-05-21 NOTE — Progress Notes (Signed)
*  PRELIMINARY RESULTS* Vascular Ultrasound Carotid Duplex (Doppler) has been completed.  Preliminary findings: Bilateral: No significant (1-39%) ICA stenosis. Antegrade vertebral flow.     Landry Mellow, RDMS, RVT  05/21/2017, 3:25 PM

## 2017-05-21 NOTE — Progress Notes (Signed)
STROKE TEAM PROGRESS NOTE   SUBJECTIVE (INTERVAL HISTORY) No family at bedside. Still numb and weak left arm and hand with difficulty using the left hand.    OBJECTIVE Temp:  [98 F (36.7 C)-99.3 F (37.4 C)] 98.1 F (36.7 C) (06/13 1431) Pulse Rate:  [61-74] 73 (06/13 1431) Cardiac Rhythm: Normal sinus rhythm (06/12 2053) Resp:  [14-25] 18 (06/13 1431) BP: (120-153)/(65-97) 131/78 (06/13 1431) SpO2:  [94 %-99 %] 96 % (06/13 1431)  CBC:  Recent Labs Lab 05/20/17 1149  WBC 5.6  NEUTROABS 2.5  HGB 11.3*  HCT 34.5*  MCV 91.8  PLT 466    Basic Metabolic Panel:  Recent Labs Lab 05/20/17 1149  NA 138  K 3.9  CL 105  CO2 24  GLUCOSE 146*  BUN 15  CREATININE 0.86  CALCIUM 8.4*    Lipid Panel:    Component Value Date/Time   CHOL 158 05/21/2017 0506   TRIG 161 (H) 05/21/2017 0506   HDL 44 05/21/2017 0506   CHOLHDL 3.6 05/21/2017 0506   VLDL 32 05/21/2017 0506   LDLCALC 82 05/21/2017 0506   HgbA1c:  Lab Results  Component Value Date   HGBA1C 7.2 (H) 11/11/2016   Urine Drug Screen:    Component Value Date/Time   LABOPIA NONE DETECTED 06/22/2012 2109   COCAINSCRNUR NONE DETECTED 06/22/2012 2109   LABBENZ POSITIVE (A) 06/22/2012 2109   AMPHETMU NONE DETECTED 06/22/2012 2109   THCU NONE DETECTED 06/22/2012 2109   LABBARB NONE DETECTED 06/22/2012 2109    Alcohol Level     Component Value Date/Time   [facility] <11 06/22/2012 2109    IMAGING  [name] 05/20/2017 1. Small acute right perirolandic infarct. 2. Mild chronic small vessel ischemic disease, advanced for age.   Mr Jodene Nam Head/brain Wo Cm 05/21/2017 Negative MRA head.    PHYSICAL EXAM  Physical exam: Exam: Gen: NAD, conversant    CV: RRR, no MRG. No Carotid Bruits. No peripheral edema, warm, nontender Eyes: Conjunctivae clear without exudates or hemorrhage  Neuro: Detailed Neurologic Exam  Speech:    Speech is normal; fluent and spontaneous with normal comprehension.  Cognition:    The patient is oriented to person, place, and time;     recent and remote memory intact;     language fluent;     normal attention, concentration,     fund of knowledge Cranial Nerves:    The pupils are equal, round, and reactive to light. Visual fields are full to finger confrontation. Extraocular movements are intact. Trigeminal sensation is intact and the muscles of mastication are normal. The face is symmetric. The palate elevates in the midline. Hearing intact. Voice is normal. Shoulder shrug is normal. The tongue has normal motion without fasciculations.   Coordination:   Decreased fine motor movements left hand  Motor Observation:    No asymmetry, no atrophy, and no involuntary movements noted. Tone:    Normal muscle tone.    Strength: Left arm drift. Otherwise strength is V/V in the upper and lower limbs.      Sensation: intact to LT     Reflex Exam:  DTR's:    Deep tendon reflexes in the upper and lower extremities are symmetrical bilaterally.    ASSESSMENT/PLAN Ms. FARHEEN PFAHLER is a 46 y.o. female with history of OSA, migraine, seizures on dilantin and keppra, diabetes and HTN presenting with LUE numbness. She did not receive IV t-PA due to delay in arrival.   Stroke:  R perirolandic infarct possibly embolic  MRI head small R perirolandic infarct  MRA head Unremarkable   Resultant mild left arm weakness and difficulty with fine motor left hand  Carotid Doppler  pending  2D Echo  pending  LDL 82  HgbA1c pending  May consider TEE, will wait for carotids and echo and re-evaluate  SCDs for VTE prophylaxis  Diet Heart Room service appropriate? Yes; Fluid consistency: Thin  No antithrombotic prior to admission, now on aspirin 325 mg daily  Patient counseled to be compliant with her antithrombotic medications  Ongoing aggressive stroke risk factor management  Therapy recommendations:  HH SLP, OP OT  Disposition:  pending   Followed by Dr. Jannifer Franklin  as an OP (migraine, seizures)  Hypertension  Stable Permissive hypertension (OK if < 220/120) but gradually normalize in 5-7 days Long-term BP goal normotensive  Hyperlipidemia  Home meds:  No statin  LDL 82, goal < 70  Added statin - lipitor 10  Continue statin at discharge  Diabetes type II  HgbA1c pending, goal < 7.0  Other Stroke Risk Factors  Morbid Obesity, Body mass index is 49.75 kg/m., recommend weight loss, diet and exercise as appropriate   Migraines  Obstructive sleep apnea, stress compliance.  Other Active Problems  Bipolar d/o  Depression  Mild MR  Seizures on dilantin and [facility] day # 1  Personally examined patient and images, and have participated in and made any corrections needed to history, physical, neuro exam,assessment and plan as stated above.  I have personally obtained the history, evaluated lab date, reviewed imaging studies and agree with radiology interpretations.    Sarina Ill, MD Stroke Neurology  To contact Stroke Continuity provider, please refer to [email or web address] After hours, contact General Neurology

## 2017-05-21 NOTE — Evaluation (Addendum)
Physical Therapy Evaluation Patient Details Name: Brandi Gomez MRN: [id number] DOB: September 10, 1971 Today's Date: 05/21/2017   History of Present Illness  Pt is a 46 y.o. female who presented to the ED with new onset weakness and numbness in her L UE. MRI revealed R perirolandic acute ischemic infarction. Pt has  PMH significant for anemia, anxiety, bipolar 1 disorder, common migraine, depression, hypertension, mild mental retardation, obesity, partial complex seizure intractable epilepsy, intractable seizures, sleep apnea, type II or unspecified type diabetes mellitus without mention of complication, not stated as uncontrolled.   Clinical Impression  Pt admitted with/for new onset of weakness and nubness or L UE and heaviness of L LE.  MRI showing right motor cortex.  Pt currently limited functionally due to the problems listed below.  (see problems list.)  Pt will benefit from PT to maximize function and safety to be able to get home safely with available assist.     Follow Up Recommendations Outpatient PT    Equipment Recommendations  None recommended by PT    Recommendations for Other Services       Precautions / Restrictions Precautions Precautions: Fall      Mobility  Bed Mobility Overal bed mobility: Needs Assistance Bed Mobility: Supine to Sit;Sit to Supine     Supine to sit: Supervision Sit to supine: Supervision   General bed mobility comments: Supervision for safety.   Transfers Overall transfer level: Needs assistance Equipment used: None Transfers: Sit to/from Stand Sit to Stand: Supervision         General transfer comment: Supervision for safety.   Ambulation/Gait Ambulation/Gait assistance: Supervision Ambulation Distance (Feet): 160 Feet Assistive device: None Gait Pattern/deviations: Step-through pattern   Gait velocity interpretation: Below normal speed for age/gender General Gait Details: generally steady with mildly excessive lateral w/shift  bil.  Pt able to scan R/L/up/down without deviation,  she can speed up appreciably, turn abruptly without deviation or LOB.  Stairs Stairs: Yes Stairs assistance: Min guard Stair Management: One rail Right;Step to pattern;Forwards Number of Stairs: 2 General stair comments: safe enough with rail  Wheelchair Mobility    Modified Rankin (Stroke Patients Only) Modified Rankin (Stroke Patients Only) Pre-Morbid Rankin Score: Slight disability Modified Rankin: Moderate disability     Balance Overall balance assessment: Needs assistance Sitting-balance support: No upper extremity supported Sitting balance-Leahy Scale: Good     Standing balance support: Bilateral upper extremity supported;During functional activity Standing balance-Leahy Scale: Fair                               Pertinent Vitals/Pain Pain Assessment: No/denies pain    Home Living Family/patient expects to be discharged to:: Private residence Living Arrangements: Spouse/significant other;Children Available Help at Discharge: Family;Available 24 hours/day Type of Home: House Home Access: Stairs to enter Entrance Stairs-Rails: Psychiatric nurse of Steps: 4-5 Home Layout: One level Home Equipment: None      Prior Function Level of Independence: Needs assistance   Gait / Transfers Assistance Needed: Independence in the home.  Sedentary per fiance'  ADL's / Homemaking Assistance Needed: Reports independence with basic ADL. Assistance required for IADL and home management from family.         Hand Dominance   Dominant Hand: Right    Extremity/Trunk Assessment   Upper Extremity Assessment Upper Extremity Assessment: Defer to OT evaluation    Lower Extremity Assessment Lower Extremity Assessment: LLE deficits/detail LLE Deficits / Details: proximal weakness and  imbalances at 4/5 LLE Coordination: decreased fine motor       Communication   Communication: No difficulties   Cognition Arousal/Alertness: Awake/alert Behavior During Therapy: WFL for tasks assessed/performed Overall Cognitive Status: History of cognitive impairments - at baseline                                 General Comments: Pt with history of decreased short-term memory and decreased problem solving skills.       General Comments      Exercises     Assessment/Plan    PT Assessment Patient needs continued PT services  PT Problem List Decreased strength;Decreased activity tolerance;Decreased mobility;Decreased balance       PT Treatment Interventions Gait training;Functional mobility training;Therapeutic activities;Balance training;Patient/family education;Neuromuscular re-education    PT Goals (Current goals can be found in the Care Plan section)  Acute Rehab PT Goals Patient Stated Goal: to get her arm to work PT Goal Formulation: With patient Time For Goal Achievement: 05/28/17 Potential to Achieve Goals: Good    Frequency Min 3X/week   Barriers to discharge        Co-evaluation               AM-PAC PT "6 Clicks" Daily Activity  Outcome Measure Difficulty turning over in bed (including adjusting bedclothes, sheets and blankets)?: None Difficulty moving from lying on back to sitting on the side of the bed? : None Difficulty sitting down on and standing up from a chair with arms (e.g., wheelchair, bedside commode, etc,.)?: A Little Help needed moving to and from a bed to chair (including a wheelchair)?: A Little Help needed walking in hospital room?: A Little Help needed climbing 3-5 steps with a railing? : A Little 6 Click Score: 20    End of Session   Activity Tolerance: Patient tolerated treatment well Patient left: in bed;with call bell/phone within reach;with bed alarm set;with family/visitor present Nurse Communication: Mobility status PT Visit Diagnosis: Unsteadiness on feet (R26.81);Other abnormalities of gait and mobility  (R26.89);Hemiplegia and hemiparesis Hemiplegia - Right/Left: Left Hemiplegia - dominant/non-dominant: Non-dominant Hemiplegia - caused by: Cerebral infarction    Time: 7121-9758 PT Time Calculation (min) (ACUTE ONLY): 33 min   Charges:   PT Evaluation $PT Eval Moderate Complexity: 1 Procedure PT Treatments $Gait Training: 8-22 mins   PT G Codes:        2017-06-17  Donnella Sham, PT [phone]  (pager)  Brandi Gomez 06-17-17, 3:55 PM

## 2017-05-21 NOTE — Consult Note (Signed)
Admission H&P    Chief Complaint: New onset left upper extremity weakness and numbness.  HPI: Brandi Gomez is an 46 y.o. female history diabetes mellitus, sleep apnea, seizure disorder, hypertension and depression, presenting with new onset weakness and numbness involving after upper extremity. Patient noticed when she woke up on the morning of 05/20/2017 that she had numbness and weakness of her left hand and difficulty with using her left upper extremity. She had no symptoms involving left lower extremity. There was no facial droop and no facial numbness. Speech did not change. MRI of the brain showed right perirolandic acute ischemic infarction. NIH stroke score was 2.  LSN: 1:00 AM on 05/20/2017 tPA Given: No: Beyond time under for treatment when she arrived in the ED mRankin:  Past Medical History:  Diagnosis Date  . Anemia   . Anxiety   . Bipolar 1 disorder ([place])   . Common migraine 05/19/2015  . Depression   . Hypertension   . Mild mental retardation   . Obesity   . Partial complex seizure disorder with intractable epilepsy ([place]) 05/12/2014  . Seizures ([place])    intractable  . Sleep apnea   . Type II or unspecified type diabetes mellitus without mention of complication, not stated as uncontrolled     Past Surgical History:  Procedure Laterality Date  . COLONOSCOPY     2012-normal , Dr Sharlett Iles  . ESOPHAGOGASTRODUODENOSCOPY     normal-Dr Patterson 2012  . MYRINGOTOMY WITH TUBE PLACEMENT    . NASAL SINUS SURGERY      Family History  Problem Relation Age of Onset  . Diabetes Mother        passed away from accidental death  . Mental illness Father   . Diabetes Daughter   . Cancer Daughter        leukemia  . Cancer Maternal Aunt        ovarian ca   Social History:  reports that she has never smoked. She has never used smokeless tobacco. She reports that she does not drink alcohol or use drugs.  Allergies:  Allergies  Allergen Reactions  . Amoxicillin Itching     Has patient had a PCN reaction causing immediate rash, facial/tongue/throat swelling, SOB or lightheadedness with hypotension: yes Has patient had a PCN reaction causing severe rash involving mucus membranes or skin necrosis: no Has patient had a PCN reaction that required hospitalization: no Has patient had a PCN reaction occurring within the last 10 years: yes If all of the above answers are "NO", then may proceed with Cephalosporin use.  Marland Kitchen Hydrocodone Other (See Comments)    Depressed     Medications Prior to Admission  Medication Sig Dispense Refill  . levETIRAcetam (KEPPRA) 500 MG tablet TAKE 1 TABLET BY MOUTH TWICE A DAY 60 tablet 3  . phenytoin (DILANTIN) 100 MG ER capsule TAKE 2 CAPSULES IN THE MORNING AND 3 CAPSULES IN THE EVENING 450 capsule 2  . predniSONE (DELTASONE) 5 MG tablet Begin taking 6 tablets daily, taper by one tablet daily until off the medication. 21 tablet 0  . sitaGLIPtin (JANUVIA) 50 MG tablet Take 1 tablet (50 mg total) by mouth daily. For control of blood sugar 90 tablet 1  . torsemide (DEMADEX) 20 MG tablet Take 1 tablet (20 mg total) by mouth daily. 90 tablet 1  . ibuprofen (ADVIL,MOTRIN) 600 MG tablet Take 1 tablet (600 mg total) by mouth 3 (three) times daily. (Patient not taking: Reported on 05/20/2017) 30 tablet  0    ROS: History obtained from chart review and the patient  General ROS: negative for - chills, fatigue, fever, night sweats, weight gain or weight loss Psychological ROS: negative for - behavioral disorder, hallucinations, memory difficulties, mood swings or suicidal ideation Ophthalmic ROS: negative for - blurry vision, double vision, eye pain or loss of vision ENT ROS: negative for - epistaxis, nasal discharge, oral lesions, sore throat, tinnitus or vertigo Allergy and Immunology ROS: negative for - hives or itchy/watery eyes Hematological and Lymphatic ROS: negative for - bleeding problems, bruising or swollen lymph nodes Endocrine ROS:  negative for - galactorrhea, hair pattern changes, polydipsia/polyuria or temperature intolerance Respiratory ROS: negative for - cough, hemoptysis, shortness of breath or wheezing Cardiovascular ROS: negative for - chest pain, dyspnea on exertion, edema or irregular heartbeat Gastrointestinal ROS: negative for - abdominal pain, diarrhea, hematemesis, nausea/vomiting or stool incontinence Genito-Urinary ROS: negative for - dysuria, hematuria, incontinence or urinary frequency/urgency Musculoskeletal ROS: negative for - joint swelling or muscular weakness Neurological ROS: as noted in HPI; nocturnal seizure about 2 weeks ago. Dermatological ROS: negative for rash and skin lesion changes  Physical Examination: Blood pressure 134/75, pulse 74, temperature 98 F (36.7 C), temperature source Oral, resp. rate 16, height '5\' 2"'$  (1.575 m), weight 123.4 kg (272 lb), last menstrual period 05/20/2017, SpO2 98 %.  HEENT-  Normocephalic, no lesions, without obvious abnormality.  Normal external eye and conjunctiva.  Normal TM's bilaterally.  Normal auditory canals and external ears. Normal external nose, mucus membranes and septum.  Normal pharynx. Neck supple with no masses, nodes, nodules or enlargement. Cardiovascular - regular rate and rhythm, S1, S2 normal, no murmur, click, rub or gallop Lungs - chest clear, no wheezing, rales, normal symmetric air entry Abdomen - soft, non-tender; bowel sounds normal; no masses,  no organomegaly Extremities - no joint deformities, effusion, or inflammation  Neurologic Examination: Mental Status: Alert, oriented, no acute distress.  Speech fluent without evidence of aphasia. Able to follow commands without difficulty. Cranial Nerves: II-Visual fields were normal. III/IV/VI-Pupils were equal and reacted normally to light. Extraocular movements were full and conjugate.    V/VII-no facial numbness and no facial weakness. VIII-normal. X-normal speech and symmetrical  palatal movement. XI: trapezius strength/neck flexion strength normal bilaterally XII-midline tongue extension with normal strength. Motor: Slight drift of left upper extremity as well as reduced grip strength of left hand; motor exam is otherwise unremarkable. Sensory: Normal throughout, including left upper extremity distally. Deep Tendon Reflexes: 2+ and symmetric. Plantars: Flexor bilaterally Cerebellar: Normal finger-to-nose testing. Carotid auscultation: Normal  Results for orders placed or performed during the hospital encounter of 05/20/17 (from the past 48 hour(s))  CBC with Differential     Status: Abnormal   Collection Time: 05/20/17 11:49 AM  Result Value Ref Range   WBC 5.6 4.0 - 10.5 K/uL   RBC 3.76 (L) 3.87 - 5.11 MIL/uL   Hemoglobin 11.3 (L) 12.0 - 15.0 g/dL   HCT 34.5 (L) 36.0 - 46.0 %   MCV 91.8 78.0 - 100.0 fL   MCH 30.1 26.0 - 34.0 pg   MCHC 32.8 30.0 - 36.0 g/dL   RDW 13.7 11.5 - 15.5 %   Platelets 238 150 - 400 K/uL   Neutrophils Relative % 45 %   Neutro Abs 2.5 1.7 - 7.7 K/uL   Lymphocytes Relative 48 %   Lymphs Abs 2.7 0.7 - 4.0 K/uL   Monocytes Relative 5 %   Monocytes Absolute 0.3 0.1 - 1.0  K/uL   Eosinophils Relative 2 %   Eosinophils Absolute 0.1 0.0 - 0.7 K/uL   Basophils Relative 0 %   Basophils Absolute 0.0 0.0 - 0.1 K/uL  Basic metabolic panel     Status: Abnormal   Collection Time: 05/20/17 11:49 AM  Result Value Ref Range   Sodium 138 135 - 145 mmol/L   Potassium 3.9 3.5 - 5.1 mmol/L   Chloride 105 101 - 111 mmol/L   CO2 24 22 - 32 mmol/L   Glucose, Bld 146 (H) 65 - 99 mg/dL   BUN 15 6 - 20 mg/dL   Creatinine, Ser 0.86 0.44 - 1.00 mg/dL   Calcium 8.4 (L) 8.9 - 10.3 mg/dL   GFR calc non Af Amer >60 >60 mL/min   GFR calc Af Amer >60 >60 mL/min    Comment: (NOTE) The eGFR has been calculated using the CKD EPI equation. This calculation has not been validated in all clinical situations. eGFR's persistently <60 mL/min signify possible  Chronic Kidney Disease.    Anion gap 9 5 - 15  I-stat troponin, ED     Status: None   Collection Time: 05/20/17 11:57 AM  Result Value Ref Range   Troponin i, poc 0.00 0.00 - 0.08 ng/mL   Comment 3            Comment: Due to the release kinetics of cTnI, a negative result within the first hours of the onset of symptoms does not rule out myocardial infarction with certainty. If myocardial infarction is still suspected, repeat the test at appropriate intervals.   APTT     Status: Abnormal   Collection Time: 05/20/17  4:10 PM  Result Value Ref Range   aPTT 22 (L) 24 - 36 seconds  Protime-INR     Status: None   Collection Time: 05/20/17  4:10 PM  Result Value Ref Range   Prothrombin Time 13.8 11.4 - 15.2 seconds   INR 1.06   Glucose, capillary     Status: Abnormal   Collection Time: 05/20/17  9:53 PM  Result Value Ref Range   Glucose-Capillary 148 (H) 65 - 99 mg/dL   Comment 1 Notify RN    Comment 2 Document in Chart    [name]  Result Date: 05/20/2017 CLINICAL DATA:  Left arm numbness and heaviness. EXAM: MRI HEAD WITHOUT CONTRAST TECHNIQUE: Multiplanar, multiecho pulse sequences of the brain and surrounding structures were obtained without intravenous contrast. COMPARISON:  Head CT 05/15/2015 FINDINGS: Brain: There is a small acute cortical and subcortical infarct involving the perirolandic region on the right. No intracranial hemorrhage, mass, midline shift, or extra-axial fluid collection is identified. The ventricles are normal in size. There is mild cerebellar atrophy which may be related to patient's history of seizures. Small foci of T2 hyperintensity scattered throughout the cerebral white matter bilaterally are greater than expected for patient's age and nonspecific but compatible with chronic small vessel ischemic disease given patient's risk factors including diabetes. A small focus of cortical laminar necrosis is noted in the right parietal lobe. Vascular:  Major intracranial vascular flow voids are preserved. Skull and upper cervical spine: Calvarial thickening and decreased calvarial bone marrow signal, possibly related to anti epileptic medications. Sinuses/Orbits: Unremarkable orbits. Small bilateral mastoid effusions. Small amount of right middle ear fluid as well. Clear paranasal sinuses. Other: None. IMPRESSION: 1. Small acute right perirolandic infarct. 2. Mild chronic small vessel ischemic disease, advanced for age. Electronically Signed   By: Logan Bores  M.D.   On: 05/20/2017 15:31    Assessment: 46 y.o. female with multiple risk factors for stroke presenting with acute right perirolandic ischemic infarction.  Stroke Risk Factors - diabetes mellitus and hypertension  Plan: 1. HgbA1c, fasting lipid panel 2. MRA  of the brain without contrast 3. PT consult, OT consult 4. Echocardiogram 5. Carotid dopplers 6. Prophylactic therapy-Antiplatelet med: Aspirin  7. Risk factor modification 8. Telemetry monitoring 9. Hypercoagulopathy panel  C.R. Nicole Kindred, MD [facility] [phone]  05/21/2017, 12:33 AM

## 2017-05-21 NOTE — Evaluation (Signed)
Speech Language Pathology Evaluation Patient Details Name: Brandi Gomez MRN: [id number] DOB: [birth year] Today's Date: 05/21/2017 Time: 3382-5053 SLP Time Calculation (min) (ACUTE ONLY): 25 min  Problem List:  Patient Active Problem List   Diagnosis Date Noted  . Acute CVA (cerebrovascular accident) ([place]) 05/20/2017  . Controlled type 2 diabetes mellitus without complication, without long-term current use of insulin ([place]) 05/08/2017  . Essential hypertension 07/27/2008  . Convulsions ([place]) 02/05/2007   Past Medical History:  Past Medical History:  Diagnosis Date  . Anemia   . Anxiety   . Bipolar 1 disorder ([place])   . Common migraine 05/19/2015  . Depression   . Hypertension   . Mild mental retardation   . Obesity   . Partial complex seizure disorder with intractable epilepsy ([place]) 05/12/2014  . Seizures ([place])    intractable  . Sleep apnea   . Type II or unspecified type diabetes mellitus without mention of complication, not stated as uncontrolled    Past Surgical History:  Past Surgical History:  Procedure Laterality Date  . COLONOSCOPY     2012-normal , Dr Sharlett Iles  . ESOPHAGOGASTRODUODENOSCOPY     normal-Dr Patterson 2012  . MYRINGOTOMY WITH TUBE PLACEMENT    . NASAL SINUS SURGERY     HPI:  46 year old female admitted 05/20/17 due to LUE numbness. PMH significant for seizures, DM, HTN, anemia, anxiety, BiPolar, mild mental retardation. MRI revealed small acute right perirolandic infarct and advanced mild chronic small vessel ischemic disease.   Assessment / Plan / Recommendation Clinical Impression  The Mini-Mental State Exam (MMSE) was administered. Pt scored 26/30, indicating performance within functional limits for pt level of education (high school graduate) and history of mild mental retardation. Points lost on attention subtest (spelling WORLD backwards), orientation (unsure of date), and figure copying. Pt was encouraged to notify fiance Brandi Gomez or PCP if  difficulties are noted after DC from acute care, as home health ST may be beneficial to maximize safety and independence. No further ST intervention at acute level of care.    SLP Assessment  SLP Recommendation/Assessment: All further Speech Language Pathology  needs can be addressed in the next venue of care (if needs arise)  SLP Visit Diagnosis: Attention and concentration deficit Attention and concentration deficit following: Cerebral infarction    Follow Up Recommendations  Home health SLP (if issues arise once pt returns to daily routine.)       SLP Evaluation Cognition  Overall Cognitive Status: History of cognitive impairments - at baseline Arousal/Alertness: Awake/alert Orientation Level: Oriented X4 Attention: Focused;Sustained Focused Attention: Appears intact Sustained Attention: Appears intact Awareness: Appears intact Problem Solving:  (higher levels of cognition not assessed at this time.) Comments: pt reports memory has "always been bad", due to epilepsy and OSA. Pt reports fiance does most daily chores (cooking, cleaning, manages finances). Pt doesn't drive.       Comprehension  Auditory Comprehension Overall Auditory Comprehension: Appears within functional limits for tasks assessed Reading Comprehension Reading Status: Within funtional limits (sentence level)    Expression Expression Primary Mode of Expression: Verbal Verbal Expression Overall Verbal Expression: Appears within functional limits for tasks assessed Written Expression Dominant Hand: Right Written Expression: Within Functional Limits (sentence level)   Oral / Motor  Oral Motor/Sensory Function Overall Oral Motor/Sensory Function: Within functional limits Motor Speech Overall Motor Speech: Appears within functional limits for tasks assessed   GO  Celia B. [place], [facility], [place]  Shonna Chock 05/21/2017, 12:20 PM

## 2017-05-22 ENCOUNTER — Inpatient Hospital Stay (HOSPITAL_COMMUNITY): Payer: Medicare HMO

## 2017-05-22 DIAGNOSIS — Z23 Encounter for immunization: Secondary | ICD-10-CM | POA: Diagnosis not present

## 2017-05-22 DIAGNOSIS — I360 Nonrheumatic tricuspid (valve) stenosis: Secondary | ICD-10-CM

## 2017-05-22 DIAGNOSIS — G4733 Obstructive sleep apnea (adult) (pediatric): Secondary | ICD-10-CM

## 2017-05-22 LAB — ECHOCARDIOGRAM COMPLETE
HEIGHTINCHES: 62 in
Weight: 4352 oz

## 2017-05-22 LAB — HEMOGLOBIN A1C
Hgb A1c MFr Bld: 7.2 % — ABNORMAL HIGH (ref 4.8–5.6)
Hgb A1c MFr Bld: 7.4 % — ABNORMAL HIGH (ref 4.8–5.6)
MEAN PLASMA GLUCOSE: 166 mg/dL
Mean Plasma Glucose: 160 mg/dL

## 2017-05-22 LAB — GLUCOSE, CAPILLARY
Glucose-Capillary: 125 mg/dL — ABNORMAL HIGH (ref 65–99)
Glucose-Capillary: 162 mg/dL — ABNORMAL HIGH (ref 65–99)
Glucose-Capillary: 160 mg/dL — ABNORMAL HIGH (ref 65–99)

## 2017-05-22 LAB — PROTEIN S, TOTAL: Protein S Ag, Total: 82 % (ref 60–150)

## 2017-05-22 LAB — LUPUS ANTICOAGULANT PANEL
DRVVT: 37.6 s (ref 0.0–47.0)
PTT Lupus Anticoagulant: 31 s (ref 0.0–51.9)

## 2017-05-22 LAB — CARDIOLIPIN ANTIBODIES, IGG, IGM, IGA: Anticardiolipin IgA: <9 APL U/mL (ref 0–11)

## 2017-05-22 LAB — HOMOCYSTEINE: Homocysteine: 9.3 umol/L (ref 0.0–15.0)

## 2017-05-22 LAB — PROTEIN C ACTIVITY: PROTEIN C ACTIVITY: 93 % (ref 73–180)

## 2017-05-22 LAB — PROTEIN S ACTIVITY: Protein S Activity: 63 % (ref 63–140)

## 2017-05-22 MED ORDER — ATORVASTATIN CALCIUM 10 MG PO TABS
10.0000 mg | ORAL_TABLET | Freq: Every day | ORAL | 1 refills | Status: DC
Start: 2017-05-22 — End: 2017-08-06

## 2017-05-22 MED ORDER — CARVEDILOL 3.125 MG PO TABS: 3.1250 mg | ORAL_TABLET | Freq: Two times a day (BID) | ORAL | 3 refills | Status: DC

## 2017-05-22 MED ORDER — LISINOPRIL 20 MG PO TABS: 20.0000 mg | ORAL_TABLET | Freq: Every day | ORAL | 3 refills | Status: DC

## 2017-05-22 MED ORDER — ASPIRIN 325 MG PO TABS: 325.0000 mg | ORAL_TABLET | Freq: Every day | ORAL | 1 refills | Status: DC

## 2017-05-22 NOTE — Progress Notes (Signed)
  Echocardiogram 2D Echocardiogram has been performed.  Matilde Bash 05/22/2017, 12:02 PM

## 2017-05-22 NOTE — Discharge Summary (Signed)
Physician Discharge Summary  Brandi Gomez:[id number] DOB: [birth year] DOA: 05/20/2017  PCP: Horald Pollen, MD  Admit date: 05/20/2017 Discharge date: 05/22/2017  Time spent: 35 minutes  Recommendations for Outpatient Follow-up:  1. Reassess BP and adjust medications as needed 2. Repeat BMET to follow electrolytes and renal function  3. Please arrange outpatient split night and resume CPAP   Discharge Diagnoses:  Principal Problem:   Acute CVA (cerebrovascular accident) ([name]) Active Problems:   Convulsions ([name])   Essential hypertension   Controlled type 2 diabetes mellitus without complication, without long-term current use of insulin (HCC)   OSA (obstructive sleep apnea) Chronic combined CHF  Discharge Condition: stable and improved. Will discharge home with outpatient follow up with neurology and PCP.  Diet recommendation: heart healthy diet   Filed Weights   05/20/17 1111  Weight: 123.4 kg (272 lb)    History of present illness:  46 y/o female with PMH significant for HTN, DM type, obesity, OSA, seizure and depression; whopresented with new onset LUE weakness and numbness. Found to have acute right perirolandic stroke.  Hospital Course:  1-acute perirolandic infarct -with left side weakness/numbness on her upper extremity  -LDL 82 and A1C 7.4 -echo w/o source of emboli; EF 40-45% and left ventricular hypertrophy   -carotid duplex: w/o significant stenosis  -will continue aspirin for secondary prevention -will follow up with neurology at discharge  2-HTN -stable overall -heart healthy diet ordered -will continue demadex -discharge also on low dose coreg and lisinopril   3-HLD -started on lipitor -will discharge on statins -LDL goal is < 70  4-type 2 diabetes  -A1C 7.4  -will continue linagliptin  -adjust hypoglycemic regimen as needed at follow up -advise to follow low carb diet   5-morbid obesity -Body mass index is 49.75  kg/m. -low calorie diet and exercise discussed with patient  6-seizure disorder  -stable overall  -will continue dilantin and keppra   7-OSA -patient used CPAP QHS while inpatient -will need repeat split night as an outpatient and restart treatment with CPAP  8-depression: -no SI or hallucinations -currently not taking any antidepressant   9-chronic combined systolic/diastolic heart failure  -compensated -EF 4045% -started on low dose coreg and lisinopril -advised to follow low sodium diet -will continue demadex  Procedures:  See below for x-ray reports   Carotid duplex: no significant ICA stenosis (1-39%) bilaterally; vertebral arteries with antegrade flow.  2-D echo:  - Left ventricle: Inferior and septal hypokinesis Poor image   quality no definity used. The cavity size was mildly dilated.   Wall thickness was increased in a pattern of mild LVH. Systolic   function was mildly to moderately reduced. The estimated ejection   fraction was in the range of 40% to 45%. Left ventricular   diastolic function parameters were normal. - Left atrium: The atrium was mildly dilated. - Atrial septum: No defect or patent foramen ovale was identified.  Consultations:  Neurology   Discharge Exam: Vitals:   05/22/17 0521 05/22/17 0920  BP: (!) 141/76 (!) 147/79  Pulse: 64 72  Resp: 16 18  Temp: 98 F (36.7 C) 98.3 F (36.8 C)    General:  Afebrile, no CP, no SOB. Patient obese on exam. Reported still some weakness on left hand; no further numbness reported.  Cardiovascular: S1 and S2, no rubs, no gallops  Respiratory: good air movement, no wheezing, no crackles  Abdomen: soft, NT, ND, positive BS  Musculoskeletal: no edema, no cyanosis, no clubbing  Neurology: left hand weakness, numbness described and poor coordination. Normal right side and grossly intact CN.   Discharge Instructions   Discharge Instructions    Ambulatory referral to Neurology    Complete  by:  As directed    Diet - low sodium heart healthy    Complete by:  As directed    Discharge instructions    Complete by:  As directed    Take medications as prescribed Follow heart healthy and modified carb diet  Arrange follow up with PCP in 10 days Follow up with neurology as instructed Follow low calorie diet     Current Discharge Medication List    START taking these medications   Details  aspirin 325 MG tablet Take 1 tablet (325 mg total) by mouth daily. Qty: 30 tablet, Refills: 1    atorvastatin (LIPITOR) 10 MG tablet Take 1 tablet (10 mg total) by mouth daily at 6 PM. Qty: 30 tablet, Refills: 1    carvedilol (COREG) 3.125 MG tablet Take 1 tablet (3.125 mg total) by mouth 2 (two) times daily. Qty: 60 tablet, Refills: 3    lisinopril (PRINIVIL,ZESTRIL) 20 MG tablet Take 1 tablet (20 mg total) by mouth daily. Qty: 30 tablet, Refills: 3      CONTINUE these medications which have NOT CHANGED   Details  levETIRAcetam (KEPPRA) 500 MG tablet TAKE 1 TABLET BY MOUTH TWICE A DAY Qty: 60 tablet, Refills: 3    phenytoin (DILANTIN) 100 MG ER capsule TAKE 2 CAPSULES IN THE MORNING AND 3 CAPSULES IN THE EVENING Qty: 450 capsule, Refills: 2    sitaGLIPtin (JANUVIA) 50 MG tablet Take 1 tablet (50 mg total) by mouth daily. For control of blood sugar Qty: 90 tablet, Refills: 1   Associated Diagnoses: Controlled type 2 diabetes mellitus without complication, without long-term current use of insulin (HCC)    torsemide (DEMADEX) 20 MG tablet Take 1 tablet (20 mg total) by mouth daily. Qty: 90 tablet, Refills: 1   Associated Diagnoses: Essential hypertension      STOP taking these medications     predniSONE (DELTASONE) 5 MG tablet      ibuprofen (ADVIL,MOTRIN) 600 MG tablet        Allergies  Allergen Reactions  . Amoxicillin Itching    Has patient had a PCN reaction causing immediate rash, facial/tongue/throat swelling, SOB or lightheadedness with hypotension: yes Has  patient had a PCN reaction causing severe rash involving mucus membranes or skin necrosis: no Has patient had a PCN reaction that required hospitalization: no Has patient had a PCN reaction occurring within the last 10 years: yes If all of the above answers are "NO", then may proceed with Cephalosporin use.  Marland Kitchen Hydrocodone Other (See Comments)    Depressed    Follow-up Information    Horald Pollen, MD. Schedule an appointment as soon as possible for a visit in 10 day(s).   Specialty:  Internal Medicine Contact information: [place] [address] [phone]           The results of significant diagnostics from this hospitalization (including imaging, microbiology, ancillary and laboratory) are listed below for reference.    Significant Diagnostic Studies: [name]  Result Date: 05/20/2017 CLINICAL DATA:  Left arm numbness and heaviness. EXAM: MRI HEAD WITHOUT CONTRAST TECHNIQUE: Multiplanar, multiecho pulse sequences of the brain and surrounding structures were obtained without intravenous contrast. COMPARISON:  Head CT 05/15/2015 FINDINGS: Brain: There is a small acute cortical and subcortical infarct  involving the perirolandic region on the right. No intracranial hemorrhage, mass, midline shift, or extra-axial fluid collection is identified. The ventricles are normal in size. There is mild cerebellar atrophy which may be related to patient's history of seizures. Small foci of T2 hyperintensity scattered throughout the cerebral white matter bilaterally are greater than expected for patient's age and nonspecific but compatible with chronic small vessel ischemic disease given patient's risk factors including diabetes. A small focus of cortical laminar necrosis is noted in the right parietal lobe. Vascular: Major intracranial vascular flow voids are preserved. Skull and upper cervical spine: Calvarial thickening and decreased calvarial bone marrow signal,  possibly related to anti epileptic medications. Sinuses/Orbits: Unremarkable orbits. Small bilateral mastoid effusions. Small amount of right middle ear fluid as well. Clear paranasal sinuses. Other: None. IMPRESSION: 1. Small acute right perirolandic infarct. 2. Mild chronic small vessel ischemic disease, advanced for age. Electronically Signed   By: Logan Bores M.D.   On: 05/20/2017 15:31   Mr Jodene Nam Head/brain XM Cm  Result Date: 05/21/2017 CLINICAL DATA:  Acute onset LEFT extremity numbness. History of seizures, diabetes, hypertension, bipolar disorder, migraines. EXAM: MRA HEAD WITHOUT CONTRAST TECHNIQUE: Angiographic images of the Circle of Willis were obtained using MRA technique without intravenous contrast. COMPARISON:  MRI of the head May 20, 2017 at 1419 hours FINDINGS: ANTERIOR CIRCULATION: Normal flow related enhancement of the included cervical, petrous, cavernous and supraclinoid internal carotid arteries. Patent anterior communicating artery. Normal flow related enhancement of the anterior and middle cerebral arteries, including distal segments. No large vessel occlusion, high-grade stenosis, abnormal luminal irregularity, aneurysm. POSTERIOR CIRCULATION: Codominant vertebral artery's. Basilar artery is patent, with normal flow related enhancement of the main branch vessels. Normal flow related enhancement of the posterior cerebral arteries. No large vessel occlusion, high-grade stenosis, abnormal luminal irregularity, aneurysm. ANATOMIC VARIANTS: None. Source images and MIP images were reviewed. IMPRESSION: Negative MRA head. Electronically Signed   By: Elon Alas M.D.   On: 05/21/2017 02:14   Labs: Basic Metabolic Panel:  Recent Labs Lab 05/20/17 1149  NA 138  K 3.9  CL 105  CO2 24  GLUCOSE 146*  BUN 15  CREATININE 0.86  CALCIUM 8.4*   CBC:  Recent Labs Lab 05/20/17 1149  WBC 5.6  NEUTROABS 2.5  HGB 11.3*  HCT 34.5*  MCV 91.8  PLT 238   CBG:  Recent  Labs Lab 05/21/17 1718 05/21/17 2100 05/22/17 0606 05/22/17 1122 05/22/17 1611  GLUCAP 139* 104* 125* 162* 160*    Signed:  Barton Dubois MD.  [facility] 05/22/2017, 5:27 PM

## 2017-05-22 NOTE — Care Management (Signed)
ED CM contacted by [name] RN concerning late discharge needs. Recommendations for Outpatient Neuro Rehab.  CM contacted patient to discuss recommendations patient verbalized understanding teach back done and she is agreeable. Referral completed and faxed to [facility] [phone] fax confirmation received. No further CM needs identified

## 2017-05-22 NOTE — Care Management Note (Signed)
Case Management Note  Patient Details  Name: Brandi Gomez MRN: [id number] Date of Birth: 1971/01/06  Subjective/Objective:     Pt admitted with CVA. She lives at home with her daughter and boyfriend. Per patient she has limited transportation. States her boyfriend drives but depends on if they have money for gas. Pt asking for information on transportation services. Pt states she receives some meals through her [facility]. She would like more meal assist but states was turned down for food stamps a few years ago. CM encouraged her to call and try to qualify for food stamps again.                Action/Plan: CM provided her with information on [employer] and the applications. PT/OT recommending outpatient but patient would have a difficult time getting to the appointments. Patient is more interested in having [facility] services. CM will update the MD.  CM continuing to follow for d/c needs.   Expected Discharge Date:                  Expected Discharge Plan:  [place]  In-House Referral:     Discharge planning Services  CM Consult  Post Acute Care Choice:    Choice offered to:     DME Arranged:    DME Agency:     HH Arranged:    [place] Agency:     Status of Service:  In process, will continue to follow  If discussed at Long Length of Stay Meetings, dates discussed:    Additional Comments:  Pollie Friar, RN 05/22/2017, 10:36 AM

## 2017-05-22 NOTE — Progress Notes (Signed)
Pt is being discharged home. Discharge instructions home were reviewed with patient. Pt verbalized understanding.

## 2017-05-22 NOTE — Progress Notes (Signed)
STROKE TEAM PROGRESS NOTE   SUBJECTIVE (INTERVAL HISTORY) No family at bedside. Still numb and weak left arm and hand with difficulty using the left hand. She tried calling fiance and daughter so I could speak to her on the phone and they did not answer. Hopefully will be there for discharge instructions.   OBJECTIVE Temp:  [98 F (36.7 C)-98.9 F (37.2 C)] 98.3 F (36.8 C) (06/14 0920) Pulse Rate:  [64-72] 72 (06/14 0920) Cardiac Rhythm: Normal sinus rhythm (06/14 0700) Resp:  [16-18] 18 (06/14 0920) BP: (126-147)/(75-94) 147/79 (06/14 0920) SpO2:  [97 %-99 %] 99 % (06/14 0920)  CBC:   Recent Labs Lab 05/20/17 1149  WBC 5.6  NEUTROABS 2.5  HGB 11.3*  HCT 34.5*  MCV 91.8  PLT 798    Basic Metabolic Panel:   Recent Labs Lab 05/20/17 1149  NA 138  K 3.9  CL 105  CO2 24  GLUCOSE 146*  BUN 15  CREATININE 0.86  CALCIUM 8.4*    Lipid Panel:     Component Value Date/Time   CHOL 158 05/21/2017 0506   TRIG 161 (H) 05/21/2017 0506   HDL 44 05/21/2017 0506   CHOLHDL 3.6 05/21/2017 0506   VLDL 32 05/21/2017 0506   LDLCALC 82 05/21/2017 0506   HgbA1c:  Lab Results  Component Value Date   HGBA1C 7.4 (H) 05/21/2017   Urine Drug Screen:     Component Value Date/Time   LABOPIA NONE DETECTED 06/22/2012 2109   COCAINSCRNUR NONE DETECTED 06/22/2012 2109   LABBENZ POSITIVE (A) 06/22/2012 2109   AMPHETMU NONE DETECTED 06/22/2012 2109   THCU NONE DETECTED 06/22/2012 2109   LABBARB NONE DETECTED 06/22/2012 2109    Alcohol Level     Component Value Date/Time   [facility] <11 06/22/2012 2109    IMAGING  [name] 05/20/2017 1. Small acute right perirolandic infarct. 2. Mild chronic small vessel ischemic disease, advanced for age.   Mr Jodene Nam Head/brain Wo Cm 05/21/2017 Negative MRA head.   Carotid Dopplers: Findings consistent with 1- 39 percent stenosis involving the right internal carotid artery and the left internal carotid   artery. PHYSICAL  EXAM  Physical exam: Exam: Gen: NAD, conversant    CV: RRR, no MRG. No Carotid Bruits. No peripheral edema, warm, nontender Eyes: Conjunctivae clear without exudates or hemorrhage  Neuro: Detailed Neurologic Exam  Speech:    Speech is normal; fluent and spontaneous with normal comprehension.  Cognition:    The patient is oriented to person, place, and time;     recent and remote memory intact;     language fluent;     normal attention, concentration,     fund of knowledge Cranial Nerves:    The pupils are equal, round, and reactive to light. Visual fields are full to finger confrontation. Extraocular movements are intact. Trigeminal sensation is intact and the muscles of mastication are normal. The face is symmetric. The palate elevates in the midline. Hearing intact. Voice is normal. Shoulder shrug is normal. The tongue has normal motion without fasciculations.   Coordination:   Decreased fine motor movements left hand  Motor Observation:    No asymmetry, no atrophy, and no involuntary movements noted. Tone:    Normal muscle tone.    Strength: Left arm drift. Otherwise strength is V/V in the upper and lower limbs.      Sensation: intact to LT     Reflex Exam:  DTR's:    Deep tendon reflexes in the  upper and lower extremities are symmetrical bilaterally.    ASSESSMENT/PLAN Brandi Gomez is a 46 y.o. female with history of OSA, migraine, seizures on dilantin and keppra, diabetes and HTN presenting with LUE numbness. She did not receive IV t-PA due to delay in arrival.   Stroke:   R perirolandic infarct possibly embolic due to its cortical location  MRI head small R perirolandic infarct  MRA head Unremarkable   Resultant mild left arm weakness and difficulty with fine motor left hand 1- 39 percent stenosis involving the  right internal carotid artery and the left internal carotid   artery.  2D Echo  No thrombus  LDL 82  HgbA1c 7.4  Recommend TEE  outpatient and consideration for loop  SCDs for VTE prophylaxis Diet Heart Room service appropriate? Yes; Fluid consistency: Thin  No antithrombotic prior to admission, now on aspirin 325 mg daily  Patient counseled to be compliant with her antithrombotic medications  Ongoing aggressive stroke risk factor management  Therapy recommendations:  HH SLP, OP OT  Disposition:  pending   Followed by Dr. Jannifer Franklin as an OP (migraine, seizures)  Hypertension  Stable Permissive hypertension (OK if < 220/120) but gradually normalize in 5-7 days Long-term BP goal normotensive  Hyperlipidemia  Home meds:  No statin  LDL 82, goal < 70  Added statin - lipitor 10  Continue statin at discharge  Diabetes type II  HgbA1c 7.4, goal < 7.0  Other Stroke Risk Factors  Morbid Obesity, Body mass index is 49.75 kg/m., recommend weight loss, diet and exercise as appropriate   Migraines  Obstructive sleep apnea, stress compliance.  Other Active Problems  Bipolar d/o  Depression  Mild MR  Seizures on dilantin and keppra  Follow up with Dr. Jannifer Franklin outpatient  [facility] day # 2  Personally examined patient and images, and have participated in and made any corrections needed to history, physical, neuro exam,assessment and plan as stated above.  I have personally obtained the history, evaluated lab date, reviewed imaging studies and agree with radiology interpretations.   Stroke will sign off at this time  Sarina Ill, MD Stroke Neurology  To contact Stroke Continuity provider, please refer to [email or web address] After hours, contact General Neurology

## 2017-05-22 NOTE — Progress Notes (Signed)
Physical Therapy Treatment Patient Details Name: Brandi Gomez MRN: [id number] DOB: [birth year] Today's Date: 05/22/2017    History of Present Illness Pt is a 46 y.o. female who presented to the ED with new onset weakness and numbness in her L UE. MRI revealed R perirolandic acute ischemic infarction. Pt has  PMH significant for anemia, anxiety, bipolar 1 disorder, common migraine, depression, hypertension, mild mental retardation, obesity, partial complex seizure intractable epilepsy, intractable seizures, sleep apnea, type II or unspecified type diabetes mellitus without mention of complication, not stated as uncontrolled.     PT Comments    Pt improving well.  DGI shows low risk for falls, but the bigger test will be more advanced balance and mobility incorporating L UE which can be accomplished at OPPT.   Follow Up Recommendations  Outpatient PT     Equipment Recommendations  None recommended by PT    Recommendations for Other Services       Precautions / Restrictions Precautions Precautions: Fall    Mobility  Bed Mobility Overal bed mobility: Needs Assistance Bed Mobility: Supine to Sit     Supine to sit: Supervision     General bed mobility comments: pt biased toward using R UE to assist up to EOB.  Transfers Overall transfer level: Needs assistance Equipment used: None Transfers: Sit to/from Stand Sit to Stand: Supervision         General transfer comment: Supervision for safety.   Ambulation/Gait Ambulation/Gait assistance: Supervision Ambulation Distance (Feet): 250 Feet Assistive device: None Gait Pattern/deviations: Step-through pattern   Gait velocity interpretation: at or above normal speed for age/gender General Gait Details: more steady with less lat w/shift than on evaluation.   Stairs Stairs: Yes   Stair Management: One rail Right;Step to pattern;Forwards Number of Stairs: 4 General stair comments: safe enough with rail  Wheelchair  Mobility    Modified Rankin (Stroke Patients Only) Modified Rankin (Stroke Patients Only) Pre-Morbid Rankin Score: Slight disability Modified Rankin: Moderate disability     Balance Overall balance assessment: Needs assistance Sitting-balance support: No upper extremity supported Sitting balance-Leahy Scale: Good     Standing balance support: No upper extremity supported Standing balance-Leahy Scale: Good                   Standardized Balance Assessment Standardized Balance Assessment : Dynamic Gait Index   Dynamic Gait Index Level Surface: Normal Change in Gait Speed: Normal Gait with Horizontal Head Turns: Normal Gait with Vertical Head Turns: Mild Impairment Gait and Pivot Turn: Normal Step Over Obstacle: Mild Impairment Step Around Obstacles: Normal Steps: Mild Impairment Total Score: 21      Cognition Arousal/Alertness: Awake/alert Behavior During Therapy: WFL for tasks assessed/performed Overall Cognitive Status: History of cognitive impairments - at baseline                                 General Comments: Pt with history of decreased short-term memory and decreased problem solving skills.       Exercises      General Comments        Pertinent Vitals/Pain Pain Assessment: Faces Faces Pain Scale: No hurt    Home Living                      Prior Function            PT Goals (current goals can now be found in the  care plan section) Acute Rehab PT Goals Patient Stated Goal: to get her arm to work PT Goal Formulation: With patient Time For Goal Achievement: 05/28/17 Potential to Achieve Goals: Good Progress towards PT goals: Progressing toward goals    Frequency    Min 3X/week      PT Plan Current plan remains appropriate    Co-evaluation              AM-PAC PT "6 Clicks" Daily Activity  Outcome Measure  Difficulty turning over in bed (including adjusting bedclothes, sheets and blankets)?:  None Difficulty moving from lying on back to sitting on the side of the bed? : None Difficulty sitting down on and standing up from a chair with arms (e.g., wheelchair, bedside commode, etc,.)?: A Little Help needed moving to and from a bed to chair (including a wheelchair)?: A Little Help needed walking in hospital room?: A Little Help needed climbing 3-5 steps with a railing? : A Little 6 Click Score: 20    End of Session   Activity Tolerance: Patient tolerated treatment well Patient left: in chair;with call bell/phone within reach Nurse Communication: Mobility status PT Visit Diagnosis: Hemiplegia and hemiparesis;Other abnormalities of gait and mobility (R26.89) Hemiplegia - Right/Left: Left Hemiplegia - dominant/non-dominant: Non-dominant Hemiplegia - caused by: Cerebral infarction     Time: 3818-2993 PT Time Calculation (min) (ACUTE ONLY): 24 min  Charges:  $Gait Training: 8-22 mins $Therapeutic Activity: 8-22 mins                    G Codes:       24-May-2017  Donnella Sham, PT [phone]  (pager)   [name] May 24, 2017, 5:21 PM

## 2017-05-23 ENCOUNTER — Telehealth: Payer: Self-pay | Admitting: Emergency Medicine

## 2017-05-23 LAB — BETA-2-GLYCOPROTEIN I ABS, IGG/M/A
Beta-2 Glyco I IgG: <9 GPI IgG units (ref 0–20)
Beta-2-Glycoprotein I IgA: <9 GPI IgA units (ref 0–25)
Beta-2-Glycoprotein I IgM: <9 GPI IgM units (ref 0–32)

## 2017-05-23 NOTE — Consult Note (Signed)
           [facility] CM Primary Care Navigator  05/23/2017  Brandi Gomez 24-Jan-1971 [id number]   Went to see patientat the bedsideto identify possible discharge needs but she was alreadydischarged.  Patient was discharged home yesterday per staff report with home health services.  Primary care provider's office called ([name])to notify of patient's discharge and need for post hospital follow-up and transition of care. Reminded of patient's health issues needing follow-up as well.   Made aware to refer patient to [facility] care management ifdeemed appropriatefor services.    For questions, please contact:  Dannielle Huh, BSN, RN- [facility] Primary Care Navigator  Telephone: [phone] [place]

## 2017-05-23 NOTE — Telephone Encounter (Signed)
Nurse from [facility] called stating that the pt needs to schedule a hospital follow up with [facility].  I offered to schedule but she states that she is not authorized to schedule.  Please contact pt to schedule.  Nurse also states that she was admitted for acute stroke and follow up visit needs to include a BP check and test renal functions, as well as reinforcing dietary actions and use of CPAP.  She also needs to follow up with neuro.  [facility] nurse: [phone]

## 2017-05-23 NOTE — Telephone Encounter (Signed)
pNeeds verbal order that it is okay to go  Nurse from [facility] called stating that there was a delay in starting pt care.  They can go out and see her on the 19th but a verbal OK is needed before they start.  Please advise at [phone]

## 2017-05-23 NOTE — Care Management Note (Signed)
Case Management Note  Patient Details  Name: Brandi Gomez MRN: [id number] Date of Birth: 04-Nov-1971  Subjective/Objective:                    Action/Plan: 05/23/2017 at 24: CM spoke with patient this am and she would prefer [facility] therapy instead of outpatient. CM called patient and went over list of [facility] agencies. She selected [facility]. Mary with [facility] notified and accepted the referral.   Expected Discharge Date:  05/22/17               Expected Discharge Plan:  [place]  In-House Referral:     Discharge planning Services  CM Consult  Post Acute Care Choice:  Home Health Choice offered to:  Patient  DME Arranged:    DME Agency:     HH Arranged:  PT [place]:  [facility] (now [facility])  Status of Service:  Completed, signed off  If discussed at [employer] of Stay Meetings, dates discussed:    Additional Comments:  Pollie Friar, RN 05/23/2017, 10:07 AM

## 2017-05-24 LAB — PROTEIN C, TOTAL: Protein C, Total: 83 % (ref 60–150)

## 2017-05-25 ENCOUNTER — Emergency Department (HOSPITAL_COMMUNITY): Payer: Medicare HMO

## 2017-05-25 ENCOUNTER — Encounter (HOSPITAL_COMMUNITY): Payer: Self-pay | Admitting: Emergency Medicine

## 2017-05-25 ENCOUNTER — Emergency Department (HOSPITAL_COMMUNITY)
Admission: EM | Admit: 2017-05-25 | Discharge: 2017-05-25 | Disposition: A | Discharge status: Home / Self Care | Payer: Medicare HMO | Attending: Emergency Medicine | Admitting: Emergency Medicine

## 2017-05-25 DIAGNOSIS — Z8673 Personal history of transient ischemic attack (TIA), and cerebral infarction without residual deficits: Secondary | ICD-10-CM | POA: Diagnosis not present

## 2017-05-25 DIAGNOSIS — E119 Type 2 diabetes mellitus without complications: Secondary | ICD-10-CM | POA: Insufficient documentation

## 2017-05-25 DIAGNOSIS — Z7982 Long term (current) use of aspirin: Secondary | ICD-10-CM | POA: Insufficient documentation

## 2017-05-25 DIAGNOSIS — Z79899 Other long term (current) drug therapy: Secondary | ICD-10-CM | POA: Diagnosis not present

## 2017-05-25 DIAGNOSIS — R4781 Slurred speech: Secondary | ICD-10-CM | POA: Diagnosis not present

## 2017-05-25 DIAGNOSIS — I6789 Other cerebrovascular disease: Secondary | ICD-10-CM | POA: Diagnosis not present

## 2017-05-25 DIAGNOSIS — I639 Cerebral infarction, unspecified: Secondary | ICD-10-CM | POA: Diagnosis not present

## 2017-05-25 DIAGNOSIS — R791 Abnormal coagulation profile: Secondary | ICD-10-CM | POA: Diagnosis not present

## 2017-05-25 DIAGNOSIS — I10 Essential (primary) hypertension: Secondary | ICD-10-CM | POA: Diagnosis not present

## 2017-05-25 HISTORY — DX: Cerebral infarction, unspecified: I63.9

## 2017-05-25 LAB — CBC
HCT: 34.6 % — ABNORMAL LOW (ref 36.0–46.0)
Hemoglobin: 10.9 g/dL — ABNORMAL LOW (ref 12.0–15.0)
MCH: 29.4 pg (ref 26.0–34.0)
MCHC: 31.5 g/dL (ref 30.0–36.0)
MCV: 93.3 fL (ref 78.0–100.0)
PLATELETS: 261 10*3/uL (ref 150–400)
RBC: 3.71 MIL/uL — ABNORMAL LOW (ref 3.87–5.11)
RDW: 13.7 % (ref 11.5–15.5)
WBC: 6.4 10*3/uL (ref 4.0–10.5)

## 2017-05-25 LAB — COMPREHENSIVE METABOLIC PANEL
ALBUMIN: 3 g/dL — AB (ref 3.5–5.0)
ALK PHOS: 158 U/L — AB (ref 38–126)
ALT: 34 U/L (ref 14–54)
AST: 29 U/L (ref 15–41)
Anion gap: 7 (ref 5–15)
BUN: 11 mg/dL (ref 6–20)
CHLORIDE: 104 mmol/L (ref 101–111)
CO2: 28 mmol/L (ref 22–32)
CREATININE: 0.84 mg/dL (ref 0.44–1.00)
Calcium: 8.5 mg/dL — ABNORMAL LOW (ref 8.9–10.3)
GFR calc Af Amer: >60 mL/min (ref >60)
GFR calc non Af Amer: >60 mL/min (ref >60)
GLUCOSE: 125 mg/dL — AB (ref 65–99)
Potassium: 4.2 mmol/L (ref 3.5–5.1)
SODIUM: 139 mmol/L (ref 135–145)
Total Bilirubin: 0.2 mg/dL — ABNORMAL LOW (ref 0.3–1.2)
Total Protein: 7.5 g/dL (ref 6.5–8.1)

## 2017-05-25 LAB — APTT: APTT: 30 s (ref 24–36)

## 2017-05-25 LAB — ETHANOL: Alcohol, Ethyl (B): <5 mg/dL (ref <5)

## 2017-05-25 LAB — URINALYSIS, ROUTINE W REFLEX MICROSCOPIC
Bilirubin Urine: NEGATIVE
Glucose, UA: NEGATIVE mg/dL
Ketones, ur: NEGATIVE mg/dL
Leukocytes, UA: NEGATIVE
NITRITE: NEGATIVE
PROTEIN: NEGATIVE mg/dL
SPECIFIC GRAVITY, URINE: 1.027 (ref 1.005–1.030)
pH: 5 (ref 5.0–8.0)

## 2017-05-25 LAB — I-STAT CHEM 8, ED
BUN: 14 mg/dL (ref 6–20)
CALCIUM ION: 1.14 mmol/L — AB (ref 1.15–1.40)
CHLORIDE: 102 mmol/L (ref 101–111)
Creatinine, Ser: 0.8 mg/dL (ref 0.44–1.00)
Glucose, Bld: 121 mg/dL — ABNORMAL HIGH (ref 65–99)
HEMATOCRIT: 34 % — AB (ref 36.0–46.0)
Hemoglobin: 11.6 g/dL — ABNORMAL LOW (ref 12.0–15.0)
POTASSIUM: 4.3 mmol/L (ref 3.5–5.1)
SODIUM: 139 mmol/L (ref 135–145)
TCO2: 30 mmol/L (ref 0–100)

## 2017-05-25 LAB — PROTIME-INR
INR: 1.11
PROTHROMBIN TIME: 14.3 s (ref 11.4–15.2)

## 2017-05-25 LAB — I-STAT TROPONIN, ED: Troponin i, poc: 0.01 ng/mL (ref 0.00–0.08)

## 2017-05-25 LAB — RAPID URINE DRUG SCREEN, HOSP PERFORMED
AMPHETAMINES: NOT DETECTED
BENZODIAZEPINES: NOT DETECTED
Barbiturates: NOT DETECTED
Cocaine: NOT DETECTED
OPIATES: NOT DETECTED
TETRAHYDROCANNABINOL: NOT DETECTED

## 2017-05-25 LAB — DIFFERENTIAL
BASOS ABS: 0 10*3/uL (ref 0.0–0.1)
BASOS PCT: 0 %
Eosinophils Absolute: 0.2 10*3/uL (ref 0.0–0.7)
Eosinophils Relative: 3 %
Lymphocytes Relative: 44 %
Lymphs Abs: 2.8 10*3/uL (ref 0.7–4.0)
Monocytes Absolute: 0.4 10*3/uL (ref 0.1–1.0)
Monocytes Relative: 6 %
NEUTROS ABS: 3 10*3/uL (ref 1.7–7.7)
NEUTROS PCT: 47 %

## 2017-05-25 MED ORDER — ACETAMINOPHEN 325 MG PO TABS
650.0000 mg | ORAL_TABLET | Freq: Once | ORAL | Status: AC
  Administered 2017-05-25: 650 mg via ORAL
  Filled 2017-05-25: qty 2

## 2017-05-25 NOTE — ED Provider Notes (Signed)
[place] DEPT Provider Note   CSN: [id number] Arrival date & time: 05/25/17  1051     History   Chief Complaint Chief Complaint  Patient presents with  . Anxiety    HPI Brandi Gomez is a 46 y.o. female.  Patient is a 46 year old female who was recently admitted on June 12 for left-sided weakness and numbness and found to have an acute right perirolandic stroke.  She was admitted for a stroke evaluation and was discharged on June 14. She states that she's been doing fine at home. She has had some improvement in the left-sided weakness. She still has some difficulty walking but is able ambulate. She states she woke up this morning and became very emotional and noticed that she was having slurred speech. Her last known normal from this was last night before she went to bed. She doesn't note any change in her left side weakness. No new numbness. The only difference from when she left the hospital is a new slurred speech which she was not having lost she was in the hospital. She does state that she's been having difficulty controlling her emotions and she was admitted with a stroke and she became very emotional and upset this morning which is when she noticed the slurred speech. She does note that it has persisted since arrival to the ED.      Past Medical History:  Diagnosis Date  . Anemia   . Anxiety   . Bipolar 1 disorder ([place])   . Common migraine 05/19/2015  . Depression   . Hypertension   . Mild mental retardation   . Obesity   . Partial complex seizure disorder with intractable epilepsy ([place]) 05/12/2014  . Seizures ([place])    intractable  . Sleep apnea   . Type II or unspecified type diabetes mellitus without mention of complication, not stated as uncontrolled     Patient Active Problem List   Diagnosis Date Noted  . OSA (obstructive sleep apnea)   . Acute CVA (cerebrovascular accident) ([place]) 05/20/2017  . Controlled type 2 diabetes mellitus without complication,  without long-term current use of insulin ([place]) 05/08/2017  . Essential hypertension 07/27/2008  . Convulsions ([place]) 02/05/2007    Past Surgical History:  Procedure Laterality Date  . COLONOSCOPY     2012-normal , Dr Sharlett Iles  . ESOPHAGOGASTRODUODENOSCOPY     normal-Dr Patterson 2012  . MYRINGOTOMY WITH TUBE PLACEMENT    . NASAL SINUS SURGERY      OB History    No data available       Home Medications    Prior to Admission medications   Medication Sig Start Date End Date Taking? Authorizing Provider  aspirin 325 MG tablet Take 1 tablet (325 mg total) by mouth daily. 05/23/17   Barton Dubois, MD  atorvastatin (LIPITOR) 10 MG tablet Take 1 tablet (10 mg total) by mouth daily at 6 PM. 05/22/17   Barton Dubois, MD  carvedilol (COREG) 3.125 MG tablet Take 1 tablet (3.125 mg total) by mouth 2 (two) times daily. 05/22/17 05/22/18  Barton Dubois, MD  levETIRAcetam (KEPPRA) 500 MG tablet TAKE 1 TABLET BY MOUTH TWICE A DAY 05/20/17   [name], MD  lisinopril (PRINIVIL,ZESTRIL) 20 MG tablet Take 1 tablet (20 mg total) by mouth daily. 05/22/17 05/22/18  Barton Dubois, MD  phenytoin (DILANTIN) 100 MG ER capsule TAKE 2 CAPSULES IN THE MORNING AND 3 CAPSULES IN THE EVENING 04/21/17   [name], MD  sitaGLIPtin [facility])  50 MG tablet Take 1 tablet (50 mg total) by mouth daily. For control of blood sugar 05/08/17   Sagardia, Ines Bloomer, MD  torsemide (DEMADEX) 20 MG tablet Take 1 tablet (20 mg total) by mouth daily. 05/08/17   Horald Pollen, MD    Family History Family History  Problem Relation Age of Onset  . Diabetes Mother        passed away from accidental death  . Mental illness Father   . Diabetes Daughter   . Cancer Daughter        leukemia  . Cancer Maternal Aunt        ovarian ca    Social History Social History  Substance Use Topics  . Smoking status: Never Smoker  . Smokeless tobacco: Never Used  . Alcohol use No     Allergies   Amoxicillin and  Hydrocodone   Review of Systems Review of Systems  Constitutional: Negative for chills, diaphoresis, fatigue and fever.  HENT: Negative for congestion, rhinorrhea and sneezing.   Eyes: Negative.   Respiratory: Negative for cough, chest tightness and shortness of breath.   Cardiovascular: Negative for chest pain and leg swelling.  Gastrointestinal: Negative for abdominal pain, blood in stool, diarrhea, nausea and vomiting.  Genitourinary: Negative for difficulty urinating, flank pain, frequency and hematuria.  Musculoskeletal: Negative for arthralgias and back pain.  Skin: Negative for rash.  Neurological: Positive for speech difficulty and weakness. Negative for dizziness, numbness and headaches.     Physical Exam Updated Vital Signs BP (!) 149/97 (BP Location: Right Arm)   Pulse 68   Temp 98.8 F (37.1 C) (Oral)   Resp (!) 22   LMP 05/20/2017   SpO2 100%   Physical Exam  Constitutional: She is oriented to person, place, and time. She appears well-developed and well-nourished.  HENT:  Head: Normocephalic and atraumatic.  Eyes: Pupils are equal, round, and reactive to light.  Neck: Normal range of motion. Neck supple.  Cardiovascular: Normal rate, regular rhythm and normal heart sounds.   Pulmonary/Chest: Effort normal and breath sounds normal. No respiratory distress. She has no wheezes. She has no rales. She exhibits no tenderness.  Abdominal: Soft. Bowel sounds are normal. There is no tenderness. There is no rebound and no guarding.  Musculoskeletal: Normal range of motion. She exhibits no edema.  Lymphadenopathy:    She has no cervical adenopathy.  Neurological: She is alert and oriented to person, place, and time.  Patient has slurred speech, and there is some slight right-sided facial drooping, she has normal sensation to her face. Normal tongue protrusion, normal shoulder shrug, normal eye movements, motor 4 out of 5 in the left upper and lower extremities, 5 out of 5  on the right, there some slowed finger to nose on the left as compared to the right  Skin: Skin is warm and dry. No rash noted.  Psychiatric: She has a normal mood and affect.     ED Treatments / Results  Labs (all labs ordered are listed, but only abnormal results are displayed) Labs Reviewed  ETHANOL  PROTIME-INR  APTT  CBC  DIFFERENTIAL  COMPREHENSIVE METABOLIC PANEL  RAPID URINE DRUG SCREEN, HOSP PERFORMED  URINALYSIS, ROUTINE W REFLEX MICROSCOPIC  I-STAT CHEM 8, ED  I-STAT TROPOININ, ED    EKG  EKG Interpretation None       Radiology No results found.  Procedures Procedures (including critical care time)  Medications Ordered in ED Medications - No data to display  Initial Impression / Assessment and Plan / ED Course  I have reviewed the triage vital signs and the nursing notes.  Pertinent labs & imaging results that were available during my care of the patient were reviewed by me and considered in my medical decision making (see chart for details).     Patient presents with slurred speech. On recheck, her speech sounds about the same but she states it's back to normal. She has no other new neurologic deficits. CT head is negative for acute abnormality. Labs are non-concerning. I spoke with Dr. Cheral Marker with neurology.  We will obtain an MR and if negative for acute findings, pt may be discharged home.  Care turned over to Dr. Roderic Palau pending MR.  Final Clinical Impressions(s) / ED Diagnoses   Final diagnoses:  None    New Prescriptions New Prescriptions   No medications on file     Malvin Johns, MD 05/25/17 1516

## 2017-05-25 NOTE — ED Provider Notes (Signed)
MRI did not show any new stroke. Patient's symptoms have improved. She will be discharged home and will follow-up as planned for her recent stroke   Milton Ferguson, MD 05/25/17 1904

## 2017-05-25 NOTE — ED Notes (Signed)
Patient transported to MRI 

## 2017-05-25 NOTE — ED Triage Notes (Signed)
Patient arrived to ED via [facility] from home. EMS reports: Patient awoke crying and emotional, thinking of her mom that had passed at age 48. Patient had CVA on Wed & was discharged to home on Friday. Reports that she has been emotional since CVA.  Patient has residual symptoms of slurred speech and L sided weakness from CVA. These symptoms are not new.  BP 160/90, Pulse 80, 97% on room air. CBG 131.

## 2017-05-25 NOTE — ED Notes (Signed)
Patient transported to CT 

## 2017-05-25 NOTE — ED Notes (Signed)
Patient back in room from MRI.

## 2017-05-25 NOTE — Discharge Instructions (Signed)
Follow up as planned for your previous stroke

## 2017-05-26 ENCOUNTER — Telehealth: Payer: Self-pay | Admitting: *Deleted

## 2017-05-26 ENCOUNTER — Other Ambulatory Visit: Payer: Self-pay | Admitting: Physician Assistant

## 2017-05-26 ENCOUNTER — Encounter: Payer: Self-pay | Admitting: *Deleted

## 2017-05-26 LAB — FACTOR 5 LEIDEN

## 2017-05-26 LAB — PROTHROMBIN GENE MUTATION

## 2017-05-26 NOTE — Telephone Encounter (Signed)
Spoke with pt and went over instructions for TEE on 6/22.  Pt verbalized understanding and repeated all instructions back to me.  Advised pt to call back with any questions.  Pt appreciative for call.

## 2017-05-26 NOTE — Telephone Encounter (Signed)
Patient called office in reference to scheduling hospital fu for recent stroke.  Will this patient need to see Dr. Jannifer Franklin for follow up or Dr. Erlinda Hong or Dr. Leonie Man.  Please call

## 2017-05-26 NOTE — Telephone Encounter (Signed)
Called and LVM for pt to call and set up 4-6 week f/u with Dr Jannifer Franklin. Gave [facility] phone number.   *Can offer 06/26/17 at 330pm, check in 300pm if she calls

## 2017-05-26 NOTE — Telephone Encounter (Signed)
The patient will follow-up with me in 4-6 weeks.

## 2017-05-26 NOTE — Telephone Encounter (Signed)
Dr Willis- please advise 

## 2017-05-26 NOTE — Telephone Encounter (Signed)
-----   Message from Damian Leavell, RN sent at 05/26/2017  7:45 AM EDT -----   ----- Message ----- From: Eileen Stanford, PA-C Sent: 05/23/2017  10:59 AM To: Damian Leavell, RN  Can you call patient and put in orders for outpatient TEE for stroke on 6/22 at 9am for Dr. Acie Fredrickson ? Also there is a possible LOOP recorder placement. She is currently admitted but will be going home. Thanks!!!

## 2017-05-27 NOTE — Telephone Encounter (Signed)
Dr Jannifer Franklin- are you okay with placing this referral? Patient has appt with you on 06/26/17 for hospital f/u

## 2017-05-27 NOTE — Telephone Encounter (Signed)
RN Lenna Sciara from [facility] has called asking for a home health nurse to be assigend to pt as a result of her inabilities as a result of the stroke. Melissa states pt will be in need of OT along with other services, including a walker, hospital sent pt home without anything.Please call RN Melissa at [phone] xt [phone]

## 2017-05-27 NOTE — Telephone Encounter (Signed)
Patient called office back. Spoke with phone staff. She verified she can take appt for 06/26/17 at 330 pm check in 300pm. Scheduled patient.

## 2017-05-27 NOTE — Telephone Encounter (Signed)
I called pt and advised her of appt for Friday @11am  with Dr Mitchel Honour @ [address], pt stated this wouldn't work so I transferred her to front desk to reschedule.

## 2017-05-27 NOTE — Telephone Encounter (Signed)
I will place or for home health nursing and right ever stiffen for a rolling walker.

## 2017-05-27 NOTE — Addendum Note (Signed)
Addended by: [name] on: 05/27/2017 04:40 PM   Modules accepted: Orders

## 2017-05-28 NOTE — Telephone Encounter (Signed)
Scott with [facility] called office stating they are going to have to wait for therapy before they can write and order for DME.  Please call

## 2017-05-28 NOTE — Telephone Encounter (Signed)
Called Scott back. He stated to be on safe side, patient needs to see PT/OT first before rx can be written for walker. They will call if they need anything further throughout the process.

## 2017-05-30 ENCOUNTER — Ambulatory Visit (HOSPITAL_COMMUNITY): Payer: Medicare HMO | Admitting: Anesthesiology

## 2017-05-30 ENCOUNTER — Ambulatory Visit (HOSPITAL_BASED_OUTPATIENT_CLINIC_OR_DEPARTMENT_OTHER): Payer: Medicare HMO

## 2017-05-30 ENCOUNTER — Ambulatory Visit (HOSPITAL_COMMUNITY)
Admission: RE | Admit: 2017-05-30 | Discharge: 2017-05-30 | Disposition: A | Discharge status: Home / Self Care | Payer: Medicare HMO | Source: Ambulatory Visit | Attending: Cardiovascular Disease | Admitting: Cardiovascular Disease

## 2017-05-30 ENCOUNTER — Other Ambulatory Visit: Payer: Self-pay | Admitting: Nurse Practitioner

## 2017-05-30 ENCOUNTER — Encounter (HOSPITAL_COMMUNITY)
Admission: RE | Disposition: A | Discharge status: Home / Self Care | Payer: Self-pay | Source: Ambulatory Visit | Attending: Cardiovascular Disease

## 2017-05-30 ENCOUNTER — Encounter (HOSPITAL_COMMUNITY): Payer: Self-pay | Admitting: *Deleted

## 2017-05-30 ENCOUNTER — Ambulatory Visit (HOSPITAL_COMMUNITY): Admit: 2017-05-30 | Payer: Commercial Managed Care - HMO | Admitting: Cardiology

## 2017-05-30 ENCOUNTER — Ambulatory Visit: Payer: Medicare HMO | Admitting: Emergency Medicine

## 2017-05-30 ENCOUNTER — Ambulatory Visit: Payer: Medicare HMO | Admitting: Neurology

## 2017-05-30 DIAGNOSIS — F70 Mild intellectual disabilities: Secondary | ICD-10-CM | POA: Diagnosis not present

## 2017-05-30 DIAGNOSIS — E119 Type 2 diabetes mellitus without complications: Secondary | ICD-10-CM | POA: Insufficient documentation

## 2017-05-30 DIAGNOSIS — G4733 Obstructive sleep apnea (adult) (pediatric): Secondary | ICD-10-CM | POA: Diagnosis not present

## 2017-05-30 DIAGNOSIS — Z6841 Body Mass Index (BMI) 40.0 and over, adult: Secondary | ICD-10-CM | POA: Diagnosis not present

## 2017-05-30 DIAGNOSIS — Z8673 Personal history of transient ischemic attack (TIA), and cerebral infarction without residual deficits: Secondary | ICD-10-CM | POA: Insufficient documentation

## 2017-05-30 DIAGNOSIS — E785 Hyperlipidemia, unspecified: Secondary | ICD-10-CM | POA: Insufficient documentation

## 2017-05-30 DIAGNOSIS — F319 Bipolar disorder, unspecified: Secondary | ICD-10-CM | POA: Insufficient documentation

## 2017-05-30 DIAGNOSIS — G43909 Migraine, unspecified, not intractable, without status migrainosus: Secondary | ICD-10-CM | POA: Diagnosis not present

## 2017-05-30 DIAGNOSIS — I639 Cerebral infarction, unspecified: Secondary | ICD-10-CM | POA: Diagnosis not present

## 2017-05-30 DIAGNOSIS — Z880 Allergy status to penicillin: Secondary | ICD-10-CM | POA: Diagnosis not present

## 2017-05-30 DIAGNOSIS — I6523 Occlusion and stenosis of bilateral carotid arteries: Secondary | ICD-10-CM | POA: Insufficient documentation

## 2017-05-30 DIAGNOSIS — Z7982 Long term (current) use of aspirin: Secondary | ICD-10-CM | POA: Insufficient documentation

## 2017-05-30 DIAGNOSIS — G40219 Localization-related (focal) (partial) symptomatic epilepsy and epileptic syndromes with complex partial seizures, intractable, without status epilepticus: Secondary | ICD-10-CM | POA: Diagnosis not present

## 2017-05-30 DIAGNOSIS — R569 Unspecified convulsions: Secondary | ICD-10-CM | POA: Diagnosis not present

## 2017-05-30 DIAGNOSIS — I340 Nonrheumatic mitral (valve) insufficiency: Secondary | ICD-10-CM

## 2017-05-30 DIAGNOSIS — I10 Essential (primary) hypertension: Secondary | ICD-10-CM | POA: Diagnosis not present

## 2017-05-30 DIAGNOSIS — I081 Rheumatic disorders of both mitral and tricuspid valves: Secondary | ICD-10-CM | POA: Diagnosis not present

## 2017-05-30 DIAGNOSIS — F419 Anxiety disorder, unspecified: Secondary | ICD-10-CM | POA: Insufficient documentation

## 2017-05-30 DIAGNOSIS — Z885 Allergy status to narcotic agent status: Secondary | ICD-10-CM | POA: Diagnosis not present

## 2017-05-30 HISTORY — PX: TEE WITHOUT CARDIOVERSION: SHX5443

## 2017-05-30 HISTORY — PX: LOOP RECORDER INSERTION: EP1214

## 2017-05-30 LAB — GLUCOSE, CAPILLARY: GLUCOSE-CAPILLARY: 135 mg/dL — AB (ref 65–99)

## 2017-05-30 SURGERY — ECHOCARDIOGRAM, TRANSESOPHAGEAL
Anesthesia: Monitor Anesthesia Care

## 2017-05-30 SURGERY — LOOP RECORDER INSERTION
Anesthesia: LOCAL

## 2017-05-30 MED ORDER — LIDOCAINE-EPINEPHRINE 1 %-1:100000 IJ SOLN
INTRAMUSCULAR | Status: AC
  Filled 2017-05-30: qty 1

## 2017-05-30 MED ORDER — LIDOCAINE-EPINEPHRINE 1 %-1:100000 IJ SOLN
INTRAMUSCULAR | Status: DC | PRN
  Administered 2017-05-30: 20 mL

## 2017-05-30 MED ORDER — MIDAZOLAM HCL 2 MG/2ML IJ SOLN
INTRAMUSCULAR | Status: DC | PRN
  Administered 2017-05-30: 2 mg via INTRAVENOUS

## 2017-05-30 MED ORDER — SODIUM CHLORIDE 0.9 % IV SOLN
INTRAVENOUS | Status: DC
  Administered 2017-05-30: 08:00:00 via INTRAVENOUS

## 2017-05-30 MED ORDER — PROPOFOL 500 MG/50ML IV EMUL
INTRAVENOUS | Status: DC | PRN
  Administered 2017-05-30: 100 ug/kg/min via INTRAVENOUS

## 2017-05-30 SURGICAL SUPPLY — 2 items
LOOP REVEAL LINQSYS (Prosthesis & Implant Heart) ×1 IMPLANT
PACK LOOP INSERTION (CUSTOM PROCEDURE TRAY) ×2 IMPLANT

## 2017-05-30 NOTE — Discharge Instructions (Signed)

## 2017-05-30 NOTE — Anesthesia Preprocedure Evaluation (Addendum)
Anesthesia Evaluation  Patient identified by MRN, date of birth, ID band Patient awake    Reviewed: Allergy & Precautions, H&P , NPO status , Patient's Chart, lab work & pertinent test results, reviewed documented beta blocker date and time   Airway Mallampati: II  TM Distance: >3 FB Neck ROM: full    Dental no notable dental hx. (+) Teeth Intact   Pulmonary    Pulmonary exam normal breath sounds clear to auscultation       Cardiovascular hypertension,  Rhythm:regular Rate:Normal     Neuro/Psych    GI/Hepatic   Endo/Other  diabetes  Renal/GU      Musculoskeletal   Abdominal   Peds  Hematology   Anesthesia Other Findings Type II or unspecified type diabetes mellitus Hypertension   Sleep apnea    Anxiety   Depression    Bipolar 1 disorder (HCC)   Seizures   intractable  Obesity   Mild mental retardation    Partial complex seizure disorder with intractable epilepsy ([place]) 05/12/2014  Anemia    Common migraine 05/19/2015  Stroke ([place])         Reproductive/Obstetrics                           Anesthesia Physical Anesthesia Plan  ASA: III  Anesthesia Plan: MAC   Post-op Pain Management:    Induction: Intravenous  PONV Risk Score and Plan:   Airway Management Planned: Mask and Natural Airway  Additional Equipment:   Intra-op Plan:   Post-operative Plan:   Informed Consent: I have reviewed the patients History and Physical, chart, labs and discussed the procedure including the risks, benefits and alternatives for the proposed anesthesia with the patient or authorized representative who has indicated his/her understanding and acceptance.   Dental Advisory Given  Plan Discussed with: CRNA and Surgeon  Anesthesia Plan Comments:         Anesthesia Quick Evaluation

## 2017-05-30 NOTE — H&P (View-Only) (Signed)
STROKE TEAM PROGRESS NOTE   SUBJECTIVE (INTERVAL HISTORY) No family at bedside. Still numb and weak left arm and hand with difficulty using the left hand. She tried calling fiance and daughter so I could speak to her on the phone and they did not answer. Hopefully will be there for discharge instructions.   OBJECTIVE Temp:  [98 F (36.7 C)-98.9 F (37.2 C)] 98.3 F (36.8 C) (06/14 0920) Pulse Rate:  [64-72] 72 (06/14 0920) Cardiac Rhythm: Normal sinus rhythm (06/14 0700) Resp:  [16-18] 18 (06/14 0920) BP: (126-147)/(75-94) 147/79 (06/14 0920) SpO2:  [97 %-99 %] 99 % (06/14 0920)  CBC:   Recent Labs Lab 05/20/17 1149  WBC 5.6  NEUTROABS 2.5  HGB 11.3*  HCT 34.5*  MCV 91.8  PLT 431    Basic Metabolic Panel:   Recent Labs Lab 05/20/17 1149  NA 138  K 3.9  CL 105  CO2 24  GLUCOSE 146*  BUN 15  CREATININE 0.86  CALCIUM 8.4*    Lipid Panel:     Component Value Date/Time   CHOL 158 05/21/2017 0506   TRIG 161 (H) 05/21/2017 0506   HDL 44 05/21/2017 0506   CHOLHDL 3.6 05/21/2017 0506   VLDL 32 05/21/2017 0506   LDLCALC 82 05/21/2017 0506   HgbA1c:  Lab Results  Component Value Date   HGBA1C 7.4 (H) 05/21/2017   Urine Drug Screen:     Component Value Date/Time   LABOPIA NONE DETECTED 06/22/2012 2109   COCAINSCRNUR NONE DETECTED 06/22/2012 2109   LABBENZ POSITIVE (A) 06/22/2012 2109   AMPHETMU NONE DETECTED 06/22/2012 2109   THCU NONE DETECTED 06/22/2012 2109   LABBARB NONE DETECTED 06/22/2012 2109    Alcohol Level     Component Value Date/Time   [facility] <11 06/22/2012 2109    IMAGING  [name] 05/20/2017 1. Small acute right perirolandic infarct. 2. Mild chronic small vessel ischemic disease, advanced for age.   Mr Jodene Nam Head/brain Wo Cm 05/21/2017 Negative MRA head.   Carotid Dopplers: Findings consistent with 1- 39 percent stenosis involving the right internal carotid artery and the left internal carotid   artery. PHYSICAL  EXAM  Physical exam: Exam: Gen: NAD, conversant    CV: RRR, no MRG. No Carotid Bruits. No peripheral edema, warm, nontender Eyes: Conjunctivae clear without exudates or hemorrhage  Neuro: Detailed Neurologic Exam  Speech:    Speech is normal; fluent and spontaneous with normal comprehension.  Cognition:    The patient is oriented to person, place, and time;     recent and remote memory intact;     language fluent;     normal attention, concentration,     fund of knowledge Cranial Nerves:    The pupils are equal, round, and reactive to light. Visual fields are full to finger confrontation. Extraocular movements are intact. Trigeminal sensation is intact and the muscles of mastication are normal. The face is symmetric. The palate elevates in the midline. Hearing intact. Voice is normal. Shoulder shrug is normal. The tongue has normal motion without fasciculations.   Coordination:   Decreased fine motor movements left hand  Motor Observation:    No asymmetry, no atrophy, and no involuntary movements noted. Tone:    Normal muscle tone.    Strength: Left arm drift. Otherwise strength is V/V in the upper and lower limbs.      Sensation: intact to LT     Reflex Exam:  DTR's:    Deep tendon reflexes in the  upper and lower extremities are symmetrical bilaterally.    ASSESSMENT/PLAN [name] is a 46 y.o. female with history of OSA, migraine, seizures on dilantin and keppra, diabetes and HTN presenting with LUE numbness. She did not receive IV t-PA due to delay in arrival.   Stroke:   R perirolandic infarct possibly embolic due to its cortical location  MRI head small R perirolandic infarct  MRA head Unremarkable   Resultant mild left arm weakness and difficulty with fine motor left hand 1- 39 percent stenosis involving the  right internal carotid artery and the left internal carotid   artery.  2D Echo  No thrombus  LDL 82  HgbA1c 7.4  Recommend TEE  outpatient and consideration for loop  SCDs for VTE prophylaxis Diet Heart Room service appropriate? Yes; Fluid consistency: Thin  No antithrombotic prior to admission, now on aspirin 325 mg daily  Patient counseled to be compliant with her antithrombotic medications  Ongoing aggressive stroke risk factor management  Therapy recommendations:  HH SLP, OP OT  Disposition:  pending   Followed by Dr. Jannifer Franklin as an OP (migraine, seizures)  Hypertension  Stable Permissive hypertension (OK if < 220/120) but gradually normalize in 5-7 days Long-term BP goal normotensive  Hyperlipidemia  Home meds:  No statin  LDL 82, goal < 70  Added statin - lipitor 10  Continue statin at discharge  Diabetes type II  HgbA1c 7.4, goal < 7.0  Other Stroke Risk Factors  Morbid Obesity, Body mass index is 49.75 kg/m., recommend weight loss, diet and exercise as appropriate   Migraines  Obstructive sleep apnea, stress compliance.  Other Active Problems  Bipolar d/o  Depression  Mild MR  Seizures on dilantin and keppra  Follow up with Dr. Jannifer Franklin outpatient  [facility] day # 2  Personally examined patient and images, and have participated in and made any corrections needed to history, physical, neuro exam,assessment and plan as stated above.  I have personally obtained the history, evaluated lab date, reviewed imaging studies and agree with radiology interpretations.   Stroke will sign off at this time  Sarina Ill, MD Stroke Neurology  To contact Stroke Continuity provider, please refer to [email or web address] After hours, contact General Neurology

## 2017-05-30 NOTE — Interval H&P Note (Signed)
History and Physical Interval Note:  05/30/2017 8:43 AM  Brandi Gomez  has presented today for surgery, with the diagnosis of STROKE  The various methods of treatment have been discussed with the patient and family. After consideration of risks, benefits and other options for treatment, the patient has consented to  Procedure(s): TRANSESOPHAGEAL ECHOCARDIOGRAM (TEE) (N/A) as a surgical intervention .  The patient's history has been reviewed, patient examined, no change in status, stable for surgery.  I have reviewed the patient's chart and labs.  Questions were answered to the patient's satisfaction.     Mertie Moores

## 2017-05-30 NOTE — Interval H&P Note (Signed)
History and Physical Interval Note:  05/30/2017 8:19 AM  Brandi Gomez  has presented today for surgery, with the diagnosis of STROKE  The various methods of treatment have been discussed with the patient and family. After consideration of risks, benefits and other options for treatment, the patient has consented to  Procedure(s): TRANSESOPHAGEAL ECHOCARDIOGRAM (TEE) (N/A) as a surgical intervention .  The patient's history has been reviewed, patient examined, no change in status, stable for surgery.  I have reviewed the patient's chart and labs.  Questions were answered to the patient's satisfaction.     Mertie Moores

## 2017-05-30 NOTE — CV Procedure (Signed)
    Transesophageal Echocardiogram Note  Brandi Gomez [id number] 01-25-1971  Procedure: Transesophageal Echocardiogram Indications: CVA   Procedure Details Consent: Obtained Time Out: Verified patient identification, verified procedure, site/side was marked, verified correct patient position, special equipment/implants available, Radiology Safety Procedures followed,  medications/allergies/relevent history reviewed, required imaging and test results available.  Performed  Medications:  During this procedure the patient is administered a total of Versed 2 mg and Propofol 163.5 mg IV   to achieve and maintain   Sedation ( per anesthesia ) .  The patient's heart rate, blood pressure, and oxygen saturation are monitored continuously during the procedure.  .  Left Ventrical:  Normal LV function,     Mitral Valve: normal MV  Aortic Valve: normal   Tricuspid Valve: normal   Pulmonic Valve: normal   Left Atrium/ Left atrial appendage: no thrombi   Atrial septum: no ASD or PFO by color flow and bubble contrast   Aorta: normal    Complications: No apparent complications Patient did tolerate procedure well.   Thayer Headings, Brooke Bonito., MD, [facility] 05/30/2017, 9:32 AM

## 2017-05-30 NOTE — Consult Note (Signed)
ELECTROPHYSIOLOGY CONSULT NOTE  Patient ID: Brandi Gomez MRN: [id number], DOB/AGE: 07-25-1971   Admit date: 05/30/2017 Date of Consult: 05/30/2017  Primary Physician: Horald Pollen, MD Primary Cardiologist: new to [facility] Reason for Consultation: Cryptogenic stroke; recommendations regarding Implantable Loop Recorder  History of Present Illness Brandi Gomez is a 46 y.o. female whom EP has been asked to see by Dr Jaynee Eagles for evaluation of implantable loop recorder in the setting of cryptogenic stroke. She was admitted on earlier this month with numbness and weakness of left hand and arm.  They first developed symptoms the morning of admission.  Imaging demonstrated right periolandic infarct felt to be possibly embolic 2/2 unknown source 2/2 location.  She has undergone workup for stroke including echocardiogram and carotid dopplers.  The patient has been monitored on telemetry which has demonstrated sinus rhythm with no arrhythmias.  Stroke work-up is to be completed with a TEE.   Echocardiogram this admission demonstrated EF 40-45%, mild LVH, LA 39.  Lab work is reviewed.  Prior to admission, the patient denies chest pain, shortness of breath, dizziness, palpitations, or syncope.   EP has been asked to evaluate for placement of an implantable loop recorder to monitor for atrial fibrillation.   Past Medical History:  Diagnosis Date  . Anemia   . Anxiety   . Bipolar 1 disorder ([place])   . Common migraine 05/19/2015  . Depression   . Hypertension   . Mild mental retardation   . Obesity   . Partial complex seizure disorder with intractable epilepsy ([place]) 05/12/2014  . Seizures ([place])    intractable  . Sleep apnea   . Stroke ([place])   . Type II or unspecified type diabetes mellitus without mention of complication, not stated as uncontrolled      Surgical History:  Past Surgical History:  Procedure Laterality Date  . COLONOSCOPY     2012-normal , Dr Sharlett Iles  .  ESOPHAGOGASTRODUODENOSCOPY     normal-Dr Patterson 2012  . MYRINGOTOMY WITH TUBE PLACEMENT    . NASAL SINUS SURGERY       Prescriptions Prior to Admission  Medication Sig Dispense Refill Last Dose  . aspirin 325 MG tablet Take 1 tablet (325 mg total) by mouth daily. 30 tablet 1 Past Month at Unknown time  . atorvastatin (LIPITOR) 10 MG tablet Take 1 tablet (10 mg total) by mouth daily at 6 PM. 30 tablet 1 05/29/2017 at Unknown time  . carvedilol (COREG) 3.125 MG tablet Take 1 tablet (3.125 mg total) by mouth 2 (two) times daily. 60 tablet 3 05/29/2017 at Unknown time  . diclofenac (VOLTAREN) 75 MG EC tablet Take 75 mg by mouth 2 (two) times daily.  0 05/29/2017 at Unknown time  . levETIRAcetam (KEPPRA) 500 MG tablet TAKE 1 TABLET BY MOUTH TWICE A DAY 60 tablet 3 05/29/2017 at Unknown time  . lisinopril (PRINIVIL,ZESTRIL) 20 MG tablet Take 1 tablet (20 mg total) by mouth daily. 30 tablet 3 05/29/2017 at Unknown time  . phenytoin (DILANTIN) 100 MG ER capsule TAKE 2 CAPSULES IN THE MORNING AND 3 CAPSULES IN THE EVENING (Patient taking differently: Take 200-300 mg by mouth See admin instructions. TAKE 2 CAPSULES IN THE MORNING AND 3 CAPSULES IN THE EVENING) 450 capsule 2 05/29/2017 at Unknown time  . sitaGLIPtin (JANUVIA) 50 MG tablet Take 1 tablet (50 mg total) by mouth daily. For control of blood sugar 90 tablet 1 05/29/2017 at Unknown time  . torsemide (DEMADEX) 20 MG tablet  Take 1 tablet (20 mg total) by mouth daily. 90 tablet 1 05/29/2017 at Unknown time    Inpatient Medications:   Allergies:  Allergies  Allergen Reactions  . Amoxicillin Itching    Has patient had a PCN reaction causing immediate rash, facial/tongue/throat swelling, SOB or lightheadedness with hypotension: yes Has patient had a PCN reaction causing severe rash involving mucus membranes or skin necrosis: no Has patient had a PCN reaction that required hospitalization: no Has patient had a PCN reaction occurring within the last 10  years: yes If all of the above answers are "NO", then may proceed with Cephalosporin use.  Marland Kitchen Hydrocodone Other (See Comments)    Depressed     Social History   Social History  . Marital status: Single    Spouse name: N/A  . Number of children: 3  . Years of education: 12   Occupational History  . disabled   .  Disabled   Social History Main Topics  . Smoking status: Never Smoker  . Smokeless tobacco: Never Used  . Alcohol use No  . Drug use: No  . Sexual activity: Not on file   Other Topics Concern  . Not on file   Social History Narrative   Patient lives at home with daughter.    Patient has 3 children.    Patient is right handed.    Patient has a high school education.    Patient is on disability   Patient drinks 2 cups of caffeine daily.     Family History  Problem Relation Age of Onset  . Diabetes Mother        passed away from accidental death  . Mental illness Father   . Diabetes Daughter   . Cancer Daughter        leukemia  . Cancer Maternal Aunt        ovarian ca      Review of Systems: All other systems reviewed and are otherwise negative except as noted above.  Physical Exam: Vitals:   05/30/17 0745  BP: 134/79  Pulse: 73  Resp: 19  Temp: 98.4 F (36.9 C)  TempSrc: Oral  SpO2: 96%  Weight: 272 lb (123.4 kg)  Height: 5\' 2"  (1.575 m)    GEN- The patient is well appearing, alert and oriented x 3 today.   Head- normocephalic, atraumatic Eyes-  Sclera clear, conjunctiva pink Ears- hearing intact Oropharynx- clear Neck- supple Lungs- Clear to ausculation bilaterally, normal work of breathing Heart- Regular rate and rhythm  GI- soft, NT, ND, + BS Extremities- no clubbing, cyanosis, or edema MS- no significant deformity or atrophy Skin- no rash or lesion Psych- euthymic mood, full affect   Labs:   Lab Results  Component Value Date   WBC 6.4 05/25/2017   HGB 11.6 (L) 05/25/2017   HCT 34.0 (L) 05/25/2017   MCV 93.3 05/25/2017    PLT 261 05/25/2017    Recent Labs Lab 05/25/17 1101 05/25/17 1249  NA 139 139  K 4.2 4.3  CL 104 102  CO2 28  --   BUN 11 14  CREATININE 0.84 0.80  CALCIUM 8.5*  --   PROT 7.5  --   BILITOT 0.2*  --   ALKPHOS 158*  --   ALT 34  --   AST 29  --   GLUCOSE 125* 121*     Radiology/Studies: Ct Head Wo Contrast  Result Date: 05/25/2017 CLINICAL DATA:  Recent CVA with some residual slurred speech and  left-sided weakness. No new symptoms. EXAM: CT HEAD WITHOUT CONTRAST TECHNIQUE: Contiguous axial images were obtained from the base of the skull through the vertex without intravenous contrast. COMPARISON:  MRI brain 05/20/2017. FINDINGS: Brain: Small area of low attenuation near the right centrum semiovale in the perirolandic area consistent with known recent infarction demonstrated on the MRI. No complicating features such as hemorrhage are identified. No findings for new hemispheric infarction an or intracranial hemorrhage. The ventricles are normal in size and configuration and in the midline without mass effect or shift. No extra-axial fluid collections. The brainstem and cerebellum appear grossly normal and stable. Vascular: No hyperdense vessels or worrisome vascular calcifications. Skull: No skull fracture or bone lesions. Sinuses/Orbits: The paranasal sinuses and mastoid air cells are clear. The globes are intact. Other: No scalp lesions or hematoma. IMPRESSION: Stable right perirolandic infarct. No complicating features such as hemorrhage. No new/ acute intracranial findings such as hemispheric infarction. Electronically Signed   By: Marijo Sanes M.D.   On: 05/25/2017 14:29   [name]  Result Date: 05/25/2017 CLINICAL DATA:  46 y/o F; slurred speech and left-sided weakness from CVA. EXAM: MRI HEAD WITHOUT CONTRAST TECHNIQUE: Multiplanar, multiecho pulse sequences of the brain and surrounding structures were obtained without intravenous contrast. COMPARISON:  05/25/2017 CT of  the head. 05/21/2017 MRI of the brain. FINDINGS: Brain: Focus of reduced diffusion within the right posterior frontal cortex measuring approximately 1.0 x 2.0 x 1.2 cm (volume = 1.3 cm^3) (series 6, image 22) with increased T2 signal and no significant mass effect compatible with acute/early subacute infarction. The infarction is stable from prior MRI. There are scattered foci of T2 FLAIR hyperintense signal abnormality in subcortical and periventricular white matter that are nonspecific but compatible with mild chronic microvascular ischemic changes and there is mild brain parenchymal volume loss. There is no focal mass effect, hydrocephalus, or herniation. No extra-axial collection is evident. No abnormal susceptibility hypointensity to indicate intracranial hemorrhage. Vascular: Normal flow voids. Skull and upper cervical spine: Normal marrow signal. Sinuses/Orbits: The bilateral mastoid effusions. No significant abnormal signal of paranasal sinuses. Orbits are unremarkable. Other: None. IMPRESSION: 1. Stable small acute/early subacute infarction within the right posterior frontal cortex. No associated hemorrhage or significant mass effect. 2. Stable background of mild chronic microvascular ischemic changes and mild parenchymal volume loss of the brain. Electronically Signed   By: Kristine Garbe M.D.   On: 05/25/2017 18:25   [name]  Result Date: 05/20/2017 CLINICAL DATA:  Left arm numbness and heaviness. EXAM: MRI HEAD WITHOUT CONTRAST TECHNIQUE: Multiplanar, multiecho pulse sequences of the brain and surrounding structures were obtained without intravenous contrast. COMPARISON:  Head CT 05/15/2015 FINDINGS: Brain: There is a small acute cortical and subcortical infarct involving the perirolandic region on the right. No intracranial hemorrhage, mass, midline shift, or extra-axial fluid collection is identified. The ventricles are normal in size. There is mild cerebellar atrophy which  may be related to patient's history of seizures. Small foci of T2 hyperintensity scattered throughout the cerebral white matter bilaterally are greater than expected for patient's age and nonspecific but compatible with chronic small vessel ischemic disease given patient's risk factors including diabetes. A small focus of cortical laminar necrosis is noted in the right parietal lobe. Vascular: Major intracranial vascular flow voids are preserved. Skull and upper cervical spine: Calvarial thickening and decreased calvarial bone marrow signal, possibly related to anti epileptic medications. Sinuses/Orbits: Unremarkable orbits. Small bilateral mastoid effusions. Small amount of right  middle ear fluid as well. Clear paranasal sinuses. Other: None. IMPRESSION: 1. Small acute right perirolandic infarct. 2. Mild chronic small vessel ischemic disease, advanced for age. Electronically Signed   By: Logan Bores M.D.   On: 05/20/2017 15:31   Mr Jodene Nam Head/brain AX Cm  Result Date: 05/21/2017 CLINICAL DATA:  Acute onset LEFT extremity numbness. History of seizures, diabetes, hypertension, bipolar disorder, migraines. EXAM: MRA HEAD WITHOUT CONTRAST TECHNIQUE: Angiographic images of the Circle of Willis were obtained using MRA technique without intravenous contrast. COMPARISON:  MRI of the head May 20, 2017 at 1419 hours FINDINGS: ANTERIOR CIRCULATION: Normal flow related enhancement of the included cervical, petrous, cavernous and supraclinoid internal carotid arteries. Patent anterior communicating artery. Normal flow related enhancement of the anterior and middle cerebral arteries, including distal segments. No large vessel occlusion, high-grade stenosis, abnormal luminal irregularity, aneurysm. POSTERIOR CIRCULATION: Codominant vertebral artery's. Basilar artery is patent, with normal flow related enhancement of the main branch vessels. Normal flow related enhancement of the posterior cerebral arteries. No large vessel  occlusion, high-grade stenosis, abnormal luminal irregularity, aneurysm. ANATOMIC VARIANTS: None. Source images and MIP images were reviewed. IMPRESSION: Negative MRA head. Electronically Signed   By: Elon Alas M.D.   On: 05/21/2017 02:14    12-lead ECG sinus rhythm - personally reviewed  All prior EKG's in EPIC reviewed with no documented atrial fibrillation  Telemetry sinus rhythm (reviewed strips from prior admission) - personally reviewed   Assessment and Plan:  1. Cryptogenic stroke The patient presents with cryptogenic stroke.  The patient has a TEE planned for this AM.  I spoke at length with the patient about monitoring for afib with an implantable loop recorder.  Risks, benefits, and alteratives to implantable loop recorder were discussed with the patient today.   At this time, the patient is very clear in their decision to proceed with implantable loop recorder.   Wound care was reviewed with the patient (keep incision clean and dry for 3 days).   Please call with questions.   Chanetta Marshall, NP 05/30/2017 7:58 AM  I have seen and examined this patient with Chanetta Marshall.  Agree with above, note added to reflect my findings.  On exam, RRR, no murmurs, lungs clear. Had cryptogenic stroke. Improving but still with left hand weakness. Plan for TEE today and if negative, plan for LINQ implant. Risks and benefits discussed. Risks include but not limited to bleeding and infection..    Teana Lindahl M. [place] Spees MD 05/30/2017 8:50 AM

## 2017-05-30 NOTE — Anesthesia Procedure Notes (Signed)
Procedure Name: MAC Date/Time: 05/30/2017 9:13 AM Performed by: Kyung Rudd Pre-anesthesia Checklist: Patient identified, Emergency Drugs available, Suction available and Patient being monitored Patient Re-evaluated:Patient Re-evaluated prior to inductionOxygen Delivery Method: Nasal cannula Intubation Type: IV induction Placement Confirmation: positive ETCO2

## 2017-05-30 NOTE — Transfer of Care (Signed)
Immediate Anesthesia Transfer of Care Note  Patient: Brandi Gomez  Procedure(s) Performed: Procedure(s): TRANSESOPHAGEAL ECHOCARDIOGRAM (TEE) (N/A)  Patient Location: Endoscopy Unit  Anesthesia Type:MAC  Level of Consciousness: awake, alert  and oriented  Airway & Oxygen Therapy: Patient Spontanous Breathing and Patient connected to nasal cannula oxygen  Post-op Assessment: Report given to RN, Post -op Vital signs reviewed and stable and Patient moving all extremities X 4  Post vital signs: Reviewed and stable  Last Vitals:  Vitals:   05/30/17 0745  BP: 134/79  Pulse: 73  Resp: 19  Temp: 36.9 C    Last Pain:  Vitals:   05/30/17 0745  TempSrc: Oral         Complications: No apparent anesthesia complications

## 2017-05-30 NOTE — Progress Notes (Signed)
  Echocardiogram Echocardiogram Transesophageal has been performed.  Marcanthony Sleight L Androw 05/30/2017, 9:54 AM

## 2017-06-01 ENCOUNTER — Encounter (HOSPITAL_COMMUNITY): Payer: Self-pay | Admitting: Cardiovascular Disease

## 2017-06-02 ENCOUNTER — Telehealth: Payer: Self-pay | Admitting: Neurology

## 2017-06-02 ENCOUNTER — Telehealth: Payer: Self-pay | Admitting: Emergency Medicine

## 2017-06-02 DIAGNOSIS — Z7984 Long term (current) use of oral hypoglycemic drugs: Secondary | ICD-10-CM | POA: Diagnosis not present

## 2017-06-02 DIAGNOSIS — F319 Bipolar disorder, unspecified: Secondary | ICD-10-CM | POA: Diagnosis not present

## 2017-06-02 DIAGNOSIS — E119 Type 2 diabetes mellitus without complications: Secondary | ICD-10-CM | POA: Diagnosis not present

## 2017-06-02 DIAGNOSIS — I10 Essential (primary) hypertension: Secondary | ICD-10-CM | POA: Diagnosis not present

## 2017-06-02 DIAGNOSIS — F71 Moderate intellectual disabilities: Secondary | ICD-10-CM | POA: Diagnosis not present

## 2017-06-02 DIAGNOSIS — I69354 Hemiplegia and hemiparesis following cerebral infarction affecting left non-dominant side: Secondary | ICD-10-CM | POA: Diagnosis not present

## 2017-06-02 NOTE — Telephone Encounter (Signed)
Home health nurse called to state that they will be seeing the patient starting on 06/04/17

## 2017-06-02 NOTE — Anesthesia Postprocedure Evaluation (Signed)
Anesthesia Post Note  Patient: Brandi Gomez  Procedure(s) Performed: Procedure(s) (LRB): TRANSESOPHAGEAL ECHOCARDIOGRAM (TEE) (N/A)     Patient location during evaluation: PACU Anesthesia Type: MAC Level of consciousness: awake and alert Pain management: pain level controlled Vital Signs Assessment: post-procedure vital signs reviewed and stable Respiratory status: spontaneous breathing, nonlabored ventilation, respiratory function stable and patient connected to nasal cannula oxygen Cardiovascular status: stable and blood pressure returned to baseline Anesthetic complications: no    Last Vitals:  Vitals:   05/30/17 0945 05/30/17 1000  BP: 113/69 (!) 106/55  Pulse: 63 67  Resp: 18   Temp:      Last Pain:  Vitals:   05/30/17 0935  TempSrc: Oral                 Havah Ammon EDWARD

## 2017-06-02 NOTE — Telephone Encounter (Signed)
Geraldine @ [phone] is requesting verbal orders for OT  1 time a week for 1st week 2 times a week for 2 weeks   1 time a week for last week, please call

## 2017-06-02 NOTE — Telephone Encounter (Signed)
Okay per CW,MD to call and give VO. I called and spoke with Brandi S. Gave VO for OT 1W1, 2W2, 1W1. He verbalized understanding and Brandi make note in pt chart. Nothing further needed at this time.

## 2017-06-02 NOTE — Telephone Encounter (Signed)
Called and spoke with Lawson. I gave VO for PT continuation and nursing eval per CW,MD. She verbalized understanding. Nothing further needed at this time.

## 2017-06-02 NOTE — Telephone Encounter (Signed)
Betsy with [facility] called requesting verbal orders for Physical Therapy continuation.  2/1, 1/1, 2/2, 1/1.  Also would like a verbal order for nursing evaluation for disease an medication management.  Please call

## 2017-06-03 ENCOUNTER — Observation Stay (HOSPITAL_COMMUNITY)
Admission: EM | Admit: 2017-06-03 | Discharge: 2017-06-04 | Disposition: A | Discharge status: Home / Self Care | Payer: Medicare HMO | Attending: Internal Medicine | Admitting: Internal Medicine

## 2017-06-03 ENCOUNTER — Encounter (HOSPITAL_COMMUNITY): Payer: Self-pay | Admitting: *Deleted

## 2017-06-03 DIAGNOSIS — I110 Hypertensive heart disease with heart failure: Secondary | ICD-10-CM | POA: Diagnosis not present

## 2017-06-03 DIAGNOSIS — Z7982 Long term (current) use of aspirin: Secondary | ICD-10-CM | POA: Diagnosis not present

## 2017-06-03 DIAGNOSIS — Z79899 Other long term (current) drug therapy: Secondary | ICD-10-CM | POA: Diagnosis not present

## 2017-06-03 DIAGNOSIS — I5042 Chronic combined systolic (congestive) and diastolic (congestive) heart failure: Secondary | ICD-10-CM | POA: Insufficient documentation

## 2017-06-03 DIAGNOSIS — E785 Hyperlipidemia, unspecified: Secondary | ICD-10-CM | POA: Diagnosis not present

## 2017-06-03 DIAGNOSIS — G40909 Epilepsy, unspecified, not intractable, without status epilepticus: Secondary | ICD-10-CM | POA: Insufficient documentation

## 2017-06-03 DIAGNOSIS — T783XXA Angioneurotic edema, initial encounter: Secondary | ICD-10-CM | POA: Diagnosis not present

## 2017-06-03 DIAGNOSIS — T7840XA Allergy, unspecified, initial encounter: Secondary | ICD-10-CM | POA: Diagnosis not present

## 2017-06-03 DIAGNOSIS — E119 Type 2 diabetes mellitus without complications: Secondary | ICD-10-CM | POA: Insufficient documentation

## 2017-06-03 DIAGNOSIS — Z9119 Patient's noncompliance with other medical treatment and regimen: Secondary | ICD-10-CM | POA: Insufficient documentation

## 2017-06-03 DIAGNOSIS — G4733 Obstructive sleep apnea (adult) (pediatric): Secondary | ICD-10-CM | POA: Diagnosis not present

## 2017-06-03 DIAGNOSIS — T464X5A Adverse effect of angiotensin-converting-enzyme inhibitors, initial encounter: Secondary | ICD-10-CM | POA: Diagnosis not present

## 2017-06-03 DIAGNOSIS — R220 Localized swelling, mass and lump, head: Secondary | ICD-10-CM | POA: Diagnosis not present

## 2017-06-03 DIAGNOSIS — Z8673 Personal history of transient ischemic attack (TIA), and cerebral infarction without residual deficits: Secondary | ICD-10-CM | POA: Insufficient documentation

## 2017-06-03 DIAGNOSIS — Z6841 Body Mass Index (BMI) 40.0 and over, adult: Secondary | ICD-10-CM | POA: Insufficient documentation

## 2017-06-03 DIAGNOSIS — F70 Mild intellectual disabilities: Secondary | ICD-10-CM | POA: Diagnosis not present

## 2017-06-03 DIAGNOSIS — R1310 Dysphagia, unspecified: Secondary | ICD-10-CM | POA: Diagnosis not present

## 2017-06-03 DIAGNOSIS — Z833 Family history of diabetes mellitus: Secondary | ICD-10-CM | POA: Insufficient documentation

## 2017-06-03 DIAGNOSIS — Z7984 Long term (current) use of oral hypoglycemic drugs: Secondary | ICD-10-CM | POA: Diagnosis not present

## 2017-06-03 LAB — CBC WITH DIFFERENTIAL/PLATELET
BASOS ABS: 0 10*3/uL (ref 0.0–0.1)
Basophils Relative: 0 %
EOS ABS: 0.2 10*3/uL (ref 0.0–0.7)
Eosinophils Relative: 2 %
HCT: 36.8 % (ref 36.0–46.0)
Hemoglobin: 12 g/dL (ref 12.0–15.0)
Lymphocytes Relative: 25 %
Lymphs Abs: 2.1 10*3/uL (ref 0.7–4.0)
MCH: 30.4 pg (ref 26.0–34.0)
MCHC: 32.6 g/dL (ref 30.0–36.0)
MCV: 93.2 fL (ref 78.0–100.0)
Monocytes Absolute: 0.4 10*3/uL (ref 0.1–1.0)
Monocytes Relative: 5 %
Neutro Abs: 5.9 10*3/uL (ref 1.7–7.7)
Neutrophils Relative %: 68 %
Platelets: 269 10*3/uL (ref 150–400)
RBC: 3.95 MIL/uL (ref 3.87–5.11)
RDW: 13.7 % (ref 11.5–15.5)
WBC: 8.7 10*3/uL (ref 4.0–10.5)

## 2017-06-03 LAB — I-STAT CHEM 8, ED
BUN: 23 mg/dL — ABNORMAL HIGH (ref 6–20)
CALCIUM ION: 0.98 mmol/L — AB (ref 1.15–1.40)
Chloride: 98 mmol/L — ABNORMAL LOW (ref 101–111)
Creatinine, Ser: 1.1 mg/dL — ABNORMAL HIGH (ref 0.44–1.00)
Glucose, Bld: 174 mg/dL — ABNORMAL HIGH (ref 65–99)
HCT: 37 % (ref 36.0–46.0)
Hemoglobin: 12.6 g/dL (ref 12.0–15.0)
Potassium: 3.8 mmol/L (ref 3.5–5.1)
Sodium: 137 mmol/L (ref 135–145)
TCO2: 30 mmol/L (ref 0–100)

## 2017-06-03 LAB — ABO/RH: ABO/RH(D): B POS

## 2017-06-03 LAB — CBG MONITORING, ED: Glucose-Capillary: 191 mg/dL — ABNORMAL HIGH (ref 65–99)

## 2017-06-03 LAB — GLUCOSE, CAPILLARY: GLUCOSE-CAPILLARY: 189 mg/dL — AB (ref 65–99)

## 2017-06-03 MED ORDER — METHYLPREDNISOLONE SODIUM SUCC 125 MG IJ SOLR
125.0000 mg | Freq: Once | INTRAMUSCULAR | Status: AC
  Administered 2017-06-03: 125 mg via INTRAVENOUS
  Filled 2017-06-03: qty 2

## 2017-06-03 MED ORDER — DIPHENHYDRAMINE HCL 50 MG/ML IJ SOLN
25.0000 mg | Freq: Four times a day (QID) | INTRAMUSCULAR | Status: DC
  Administered 2017-06-03 – 2017-06-04 (×3): 25 mg via INTRAVENOUS
  Filled 2017-06-03 (×3): qty 1

## 2017-06-03 MED ORDER — SODIUM CHLORIDE 0.9 % IV SOLN
500.0000 mg | Freq: Two times a day (BID) | INTRAVENOUS | Status: DC
  Administered 2017-06-03: 500 mg via INTRAVENOUS
  Filled 2017-06-03 (×3): qty 5

## 2017-06-03 MED ORDER — FAMOTIDINE IN NACL 20-0.9 MG/50ML-% IV SOLN
20.0000 mg | Freq: Two times a day (BID) | INTRAVENOUS | Status: DC
  Administered 2017-06-04: 20 mg via INTRAVENOUS
  Filled 2017-06-03: qty 50

## 2017-06-03 MED ORDER — SODIUM CHLORIDE 0.9 % IV SOLN
200.0000 mg | Freq: Two times a day (BID) | INTRAVENOUS | Status: DC
  Administered 2017-06-03: 200 mg via INTRAVENOUS
  Filled 2017-06-03 (×2): qty 4

## 2017-06-03 MED ORDER — METHYLPREDNISOLONE SODIUM SUCC 40 MG IJ SOLR
40.0000 mg | Freq: Four times a day (QID) | INTRAMUSCULAR | Status: DC
  Administered 2017-06-03 – 2017-06-04 (×2): 40 mg via INTRAVENOUS
  Filled 2017-06-03 (×2): qty 1

## 2017-06-03 MED ORDER — DIPHENHYDRAMINE HCL 50 MG/ML IJ SOLN
25.0000 mg | Freq: Once | INTRAMUSCULAR | Status: AC
  Administered 2017-06-03: 25 mg via INTRAVENOUS
  Filled 2017-06-03: qty 1

## 2017-06-03 MED ORDER — SODIUM CHLORIDE 0.9 % IV SOLN
INTRAVENOUS | Status: DC
  Administered 2017-06-03: 75 mL/h via INTRAVENOUS

## 2017-06-03 MED ORDER — FAMOTIDINE IN NACL 20-0.9 MG/50ML-% IV SOLN
20.0000 mg | Freq: Once | INTRAVENOUS | Status: AC
  Administered 2017-06-03: 20 mg via INTRAVENOUS
  Filled 2017-06-03: qty 50

## 2017-06-03 MED ORDER — SODIUM CHLORIDE 0.9 % IV BOLUS (SEPSIS)
1000.0000 mL | Freq: Once | INTRAVENOUS | Status: AC
  Administered 2017-06-03: 1000 mL via INTRAVENOUS

## 2017-06-03 MED ORDER — SODIUM CHLORIDE 0.9 % IV SOLN: 10.0000 mL/h | Freq: Once | INTRAVENOUS | Status: DC

## 2017-06-03 MED ORDER — INSULIN ASPART 100 UNIT/ML ~~LOC~~ SOLN
2.0000 [IU] | SUBCUTANEOUS | Status: DC
  Administered 2017-06-03 – 2017-06-04 (×2): 4 [IU] via SUBCUTANEOUS
  Administered 2017-06-04: 6 [IU] via SUBCUTANEOUS
  Administered 2017-06-04: 2 [IU] via SUBCUTANEOUS
  Administered 2017-06-04: 4 [IU] via SUBCUTANEOUS
  Filled 2017-06-03: qty 1

## 2017-06-03 MED ORDER — HEPARIN SODIUM (PORCINE) 5000 UNIT/ML IJ SOLN
5000.0000 [IU] | Freq: Three times a day (TID) | INTRAMUSCULAR | Status: DC
  Administered 2017-06-03 – 2017-06-04 (×3): 5000 [IU] via SUBCUTANEOUS
  Filled 2017-06-03 (×3): qty 1

## 2017-06-03 NOTE — ED Notes (Signed)
vo for another bolus of ns per edp

## 2017-06-03 NOTE — Consult Note (Signed)
PULMONARY / CRITICAL CARE MEDICINE   Name: Brandi Gomez MRN: [id number] DOB: Oct 10, 1971    ADMISSION DATE:  06/03/2017 CONSULTATION DATE:  06/03/17  REFERRING MD:  Dr. Alvino Chapel  CHIEF COMPLAINT:  Angioedema  HISTORY OF PRESENT ILLNESS:   46 year old female with PMH as below, which is significant for HTN, DM, epilepsy, and OSA on CPAP (non-compliant for financial reasons). She was recently admitted to [facility] for CVA 6/12. She was discharged on 6/14 with new prescriptions for coreg and lisinopril. Etiology of stroke was not discovered and she had implantable device placed to monitor her cardiac rhythm 6/19. 6/26 she presented to [facility] ED with complaints of tongue swelling and difficulty swallowing. She says this was preceded by a few days of finger and toe swelling as well. This progressed for about an hour and she presented to the emergency department. She was given solu-medrol, famotidine, and benadryl in ED.  At the time of my exam, she still has mild facial swelling and slurred speech, but she thinks it is improved from when she first presented to the ER. A family member is with her and reports her speech sounds closer to normal now. She still endorses inability to swallow, but on exam she has no tongue or uvula edema.   PAST MEDICAL HISTORY :  She  has a past medical history of Anemia; Anxiety; Bipolar 1 disorder ([place]); Common migraine (05/19/2015); Depression; Hypertension; Mild mental retardation; Obesity; Partial complex seizure disorder with intractable epilepsy ([place]) (05/12/2014); Seizures ([place]); Sleep apnea; Stroke [facility]); and Type II or unspecified type diabetes mellitus without mention of complication, not stated as uncontrolled.  PAST SURGICAL HISTORY: She  has a past surgical history that includes Nasal sinus surgery; Myringotomy with tube placement; Colonoscopy; Esophagogastroduodenoscopy; Loop Recorder Insertion (N/A, 05/30/2017); and TEE without cardioversion (N/A,  05/30/2017).  Allergies  Allergen Reactions  . Amoxicillin Itching    Has patient had a PCN reaction causing immediate rash, facial/tongue/throat swelling, SOB or lightheadedness with hypotension: yes Has patient had a PCN reaction causing severe rash involving mucus membranes or skin necrosis: no Has patient had a PCN reaction that required hospitalization: no Has patient had a PCN reaction occurring within the last 10 years: yes If all of the above answers are "NO", then may proceed with Cephalosporin use.  Marland Kitchen Hydrocodone Other (See Comments)    Depressed   . Lisinopril Swelling    Pt suspects tongue swelling is caused by lisinopril    No current facility-administered medications on file prior to encounter.    Current Outpatient Prescriptions on File Prior to Encounter  Medication Sig  . aspirin 325 MG tablet Take 1 tablet (325 mg total) by mouth daily.  Marland Kitchen atorvastatin (LIPITOR) 10 MG tablet Take 1 tablet (10 mg total) by mouth daily at 6 PM.  . carvedilol (COREG) 3.125 MG tablet Take 1 tablet (3.125 mg total) by mouth 2 (two) times daily.  . diclofenac (VOLTAREN) 75 MG EC tablet Take 75 mg by mouth daily.   Marland Kitchen levETIRAcetam (KEPPRA) 500 MG tablet TAKE 1 TABLET BY MOUTH TWICE A DAY  . lisinopril (PRINIVIL,ZESTRIL) 20 MG tablet Take 1 tablet (20 mg total) by mouth daily.  . phenytoin (DILANTIN) 100 MG ER capsule TAKE 2 CAPSULES IN THE MORNING AND 3 CAPSULES IN THE EVENING (Patient taking differently: Take 200 mg by mouth See admin instructions. TAKE 2 CAPSULES IN THE MORNING AND 2 CAPSULES IN THE EVENING)  . sitaGLIPtin (JANUVIA) 50 MG tablet Take  1 tablet (50 mg total) by mouth daily. For control of blood sugar  . torsemide (DEMADEX) 20 MG tablet Take 1 tablet (20 mg total) by mouth daily.    FAMILY HISTORY:  Her indicated that her mother is deceased. She indicated that her father is alive. She indicated that her brother is alive. She indicated that her daughter is alive. She indicated  that the status of her maternal aunt is unknown.    SOCIAL HISTORY: She  reports that she has never smoked. She has never used smokeless tobacco. She reports that she does not drink alcohol or use drugs.  REVIEW OF SYSTEMS:   Bolds are positive  Constitutional: weight loss, gain, night sweats, Fevers, chills, fatigue .  HEENT: headaches, Sore throat, sneezing, nasal congestion, post nasal drip, Difficulty swallowing, Facial swelling, Tooth/dental problems, visual complaints visual changes, ear ache CV:  chest pain, radiates:,Orthopnea, PND, swelling in lower extremities, dizziness, palpitations, syncope.  GI  heartburn, indigestion, abdominal pain, nausea, vomiting, diarrhea, change in bowel habits, loss of appetite, bloody stools.  Resp: cough, productive:, hemoptysis, dyspnea, chest pain, pleuritic.  Skin: rash or itching or icterus GU: dysuria, change in color of urine, urgency or frequency. flank pain, hematuria  MS: joint pain or swelling. decreased range of motion  Psych: change in mood or affect. depression or anxiety.  Neuro: difficulty with speech, weakness, numbness, ataxia    VITAL SIGNS: BP 107/68   Pulse 65   Temp 98.4 F (36.9 C) (Oral)   Resp 18   LMP 05/20/2017   SpO2 98%      INTAKE / OUTPUT: No intake/output data recorded.  PHYSICAL EXAMINATION: General: morbidly obese AAF lying in stretcher in ER in NAD Neuro: AAOx3, moving all extremities HEENT: Mild swelling in area of right cheek; no lip or tongue edema, no uvular edema. Mallampati of 4.  Cardiovascular:  RRR no m/r/g Lungs: CTA b/l; no respiratory distress. Speaking in full sentences with no accessory muscle use Abdomen: obese soft NTND Musculoskeletal: no clubbing, cyanosis, or edema Skin: no rashes   LABS:  BMET  Recent Labs Lab 06/03/17 1618  NA 137  K 3.8  CL 98*  BUN 23*  CREATININE 1.10*  GLUCOSE 174*    Electrolytes No results for input(s): CALCIUM, MG, PHOS in the last 168  hours.  CBC  Recent Labs Lab 06/03/17 1553 06/03/17 1618  WBC 8.7  --   HGB 12.0 12.6  HCT 36.8 37.0  PLT 269  --     Coag's No results for input(s): APTT, INR in the last 168 hours.  Sepsis Markers No results for input(s): LATICACIDVEN, PROCALCITON, O2SATVEN in the last 168 hours.  ABG No results for input(s): PHART, PCO2ART, PO2ART in the last 168 hours.  Liver Enzymes No results for input(s): AST, ALT, ALKPHOS, BILITOT, ALBUMIN in the last 168 hours.  Cardiac Enzymes No results for input(s): TROPONINI, PROBNP in the last 168 hours.  Glucose  Recent Labs Lab 05/30/17 0819  GLUCAP 135*    Imaging No results found.  LINES/TUBES: PIV's   ASSESSMENT / PLAN: PULMONARY A: Angioedema likely secondary to new ACE-i OSA on CPAP (has not used for 6 months due to financial reasons)  P:   Close airway monitoring, may need intubation if worsens Received Solumedrol 125mg  IV in ER; continue 40mg  IV q6hrs Received Pepcid 20-mg IV in ER; will continue scheduled Received Benadryl only 25mg  in ER; could give up to 50mg  dose for an adult. However as swelling is  objectively improving as well as subjective complaints improved; so will continue 25mg  IVq6hrs Will hold CPAP as she is not using at home. Speech eval for bedside swallow in AM; NPO for now  CARDIOVASCULAR A:  Chronic systolic/diastolic CHF HTN HLD  P:  Telemetry in ICU Holding outpatient coreg, torsemide, atorvastatin  while NPO DC lisinopril  RENAL A:   AKI  P:   Continue IVF's Follow BMP  GASTROINTESTINAL A:   No acute issues  P:   NPO  HEMATOLOGIC A:   No acute issues  P:  Follow CBC  INFECTIOUS A:   No acute issues  P:   Monitor  ENDOCRINE A:   DM 2 P:   Holding sitagliptin while NPO CBG monitoring and SSI  NEUROLOGIC A:   Epilepsy Recent CVA P:   Will convert Keppra and Dilantin to IV until able to take PO   FAMILY  - Updates: patient updated  in ED - Inter-disciplinary family meet or Palliative Care meeting due by:  7/2   60 minutes critical care time   Vernie Murders, MD [facility] [place] Pager: [phone]  06/03/2017, 8:03 PM

## 2017-06-03 NOTE — H&P (Signed)
PULMONARY / CRITICAL CARE MEDICINE   Name: Brandi Gomez MRN: [id number] DOB: 1971-09-02    ADMISSION DATE:  06/03/2017 CONSULTATION DATE:  6/26  REFERRING MD:  Dr. Alvino Chapel  CHIEF COMPLAINT:  Angioedema  HISTORY OF PRESENT ILLNESS:   46 year old female with PMH as below, which is significant for HTN, DM, epilepsy, and OSA on CPAP (non-compliant for financial reasons). She was recently admitted to [facility] for CVA 6/12. She was discharged on 6/14 with new prescriptions for coreg and lisinopril. Etiology of stroke was not discovered and she had implantable device placed to monitor her cardiac rhythm 6/19. 6/26 she presented to [facility] ED with complaints of tongue swelling and difficulty swallowing. She says this was preceded by a few days of finger and toe swelling as well. This progressed for about an hour and she presented to the emergency department. She was given solu-medrol, famotidine, and benadryl in ED. PCCM asked to admit.   PAST MEDICAL HISTORY :  She  has a past medical history of Anemia; Anxiety; Bipolar 1 disorder ([place]); Common migraine (05/19/2015); Depression; Hypertension; Mild mental retardation; Obesity; Partial complex seizure disorder with intractable epilepsy ([place]) (05/12/2014); Seizures ([place]); Sleep apnea; Stroke [facility]); and Type II or unspecified type diabetes mellitus without mention of complication, not stated as uncontrolled.  PAST SURGICAL HISTORY: She  has a past surgical history that includes Nasal sinus surgery; Myringotomy with tube placement; Colonoscopy; Esophagogastroduodenoscopy; Loop Recorder Insertion (N/A, 05/30/2017); and TEE without cardioversion (N/A, 05/30/2017).  Allergies  Allergen Reactions  . Amoxicillin Itching    Has patient had a PCN reaction causing immediate rash, facial/tongue/throat swelling, SOB or lightheadedness with hypotension: yes Has patient had a PCN reaction causing severe rash involving mucus membranes or skin necrosis:  no Has patient had a PCN reaction that required hospitalization: no Has patient had a PCN reaction occurring within the last 10 years: yes If all of the above answers are "NO", then may proceed with Cephalosporin use.  Marland Kitchen Hydrocodone Other (See Comments)    Depressed     No current facility-administered medications on file prior to encounter.    Current Outpatient Prescriptions on File Prior to Encounter  Medication Sig  . aspirin 325 MG tablet Take 1 tablet (325 mg total) by mouth daily.  Marland Kitchen atorvastatin (LIPITOR) 10 MG tablet Take 1 tablet (10 mg total) by mouth daily at 6 PM.  . carvedilol (COREG) 3.125 MG tablet Take 1 tablet (3.125 mg total) by mouth 2 (two) times daily.  . diclofenac (VOLTAREN) 75 MG EC tablet Take 75 mg by mouth daily.   Marland Kitchen levETIRAcetam (KEPPRA) 500 MG tablet TAKE 1 TABLET BY MOUTH TWICE A DAY (Patient taking differently: TAKE 1 TABLET BY MOUTH ONCE A DAY)  . lisinopril (PRINIVIL,ZESTRIL) 20 MG tablet Take 1 tablet (20 mg total) by mouth daily.  . phenytoin (DILANTIN) 100 MG ER capsule TAKE 2 CAPSULES IN THE MORNING AND 3 CAPSULES IN THE EVENING (Patient taking differently: Take 200-300 mg by mouth See admin instructions. TAKE 2 CAPSULES IN THE MORNING AND 3 CAPSULES IN THE EVENING)  . sitaGLIPtin (JANUVIA) 50 MG tablet Take 1 tablet (50 mg total) by mouth daily. For control of blood sugar  . torsemide (DEMADEX) 20 MG tablet Take 1 tablet (20 mg total) by mouth daily.    FAMILY HISTORY:  Her indicated that her mother is deceased. She indicated that her father is alive. She indicated that her brother is alive. She indicated that her daughter  is alive. She indicated that the status of her maternal aunt is unknown.    SOCIAL HISTORY: She  reports that she has never smoked. She has never used smokeless tobacco. She reports that she does not drink alcohol or use drugs.  REVIEW OF SYSTEMS:   Bolds are positive  Constitutional: weight loss, gain, night sweats, Fevers,  chills, fatigue .  HEENT: headaches, Sore throat, sneezing, nasal congestion, post nasal drip, Difficulty swallowing, Tooth/dental problems, visual complaints visual changes, ear ache CV:  chest pain, radiates:,Orthopnea, PND, swelling in lower extremities, dizziness, palpitations, syncope.  GI  heartburn, indigestion, abdominal pain, nausea, vomiting, diarrhea, change in bowel habits, loss of appetite, bloody stools.  Resp: cough, productive:, hemoptysis, dyspnea, chest pain, pleuritic.  Skin: rash or itching or icterus GU: dysuria, change in color of urine, urgency or frequency. flank pain, hematuria  MS: joint pain or swelling. decreased range of motion  Psych: change in mood or affect. depression or anxiety.  Neuro: difficulty with speech, weakness, numbness, ataxia    SUBJECTIVE:    VITAL SIGNS: BP (!) 94/52   Pulse 67   Temp 99.3 F (37.4 C) (Oral)   Resp 20   LMP 05/20/2017   SpO2 99%   HEMODYNAMICS:    VENTILATOR SETTINGS:    INTAKE / OUTPUT: No intake/output data recorded.  PHYSICAL EXAMINATION: General:  Morbidly obese female Neuro:  Alert, oriented, non-focal HEENT:  Tongue edematous. No stridor. Moving good air. Phonating well.  Cardiovascular:  RRR, no MRG Lungs:  Distant clear breath sounds Abdomen:  Soft, non-tender, non-distended Musculoskeletal:  No acute deformity or ROM limitation Skin:  Grossly intact  LABS:  BMET  Recent Labs Lab 06/03/17 1618  NA 137  K 3.8  CL 98*  BUN 23*  CREATININE 1.10*  GLUCOSE 174*    Electrolytes No results for input(s): CALCIUM, MG, PHOS in the last 168 hours.  CBC  Recent Labs Lab 06/03/17 1553 06/03/17 1618  WBC 8.7  --   HGB 12.0 12.6  HCT 36.8 37.0  PLT 269  --     Coag's No results for input(s): APTT, INR in the last 168 hours.  Sepsis Markers No results for input(s): LATICACIDVEN, PROCALCITON, O2SATVEN in the last 168 hours.  ABG No results for input(s): PHART, PCO2ART, PO2ART in the  last 168 hours.  Liver Enzymes No results for input(s): AST, ALT, ALKPHOS, BILITOT, ALBUMIN in the last 168 hours.  Cardiac Enzymes No results for input(s): TROPONINI, PROBNP in the last 168 hours.  Glucose  Recent Labs Lab 05/30/17 0819  GLUCAP 135*    Imaging No results found.   STUDIES:    CULTURES:   ANTIBIOTICS:   SIGNIFICANT EVENTS: 6/26 admit  LINES/TUBES:   DISCUSSION:   ASSESSMENT / PLAN:  PULMONARY A: Angioedema likely secondary to new ACE-i OSA on CPAP (has not used for 6 months due to financial reasons)  P:   Close airway monitoring, may need intubation if worsens Solumedrol Pepcid Benadryl Will hold CPAP as she is not using at home.  CARDIOVASCULAR A:  Chronic systolic/diastolic CHF HTN HLD  P:  Telemetry in ICU Holding outpatient coreg, torsemide, atorvastatin  while NPO DC lisinopril  RENAL A:   AKI  P:   IVF Follow BMP  GASTROINTESTINAL A:   No acute issues  P:   NPO  HEMATOLOGIC A:   No acute issues  P:  Follow CBC  INFECTIOUS A:   No acute issues  P:   Monitor  ENDOCRINE A:   DM 2 P:   Holding sitagliptin while NPO CBG monitoring and SSI  NEUROLOGIC A:   Epilepsy Recent CVA P:   Will convert Keppra and Dilantin to IV until able to take PO    FAMILY  - Updates: patient updated in ED  - Inter-disciplinary family meet or Palliative Care meeting due by:  7/2   Georgann Housekeeper, AGACNP-BC [place] [facility] Pager [phone] or [phone]  06/03/2017 6:48 PM

## 2017-06-03 NOTE — ED Triage Notes (Signed)
Pt arrives via [facility] from home. Pt was started on lisinopril following a CVA 05/28/17. Pt states she noticed some mild hand swelling "a couple days ago" and woke up today with mild tongue and throat swelling which has become progressively worse throughout the day.

## 2017-06-03 NOTE — ED Provider Notes (Signed)
[facility] DEPT Provider Note   CSN: [id number] Arrival date & time: 06/03/17  1542     History   Chief Complaint Chief Complaint  Patient presents with  . Oral Swelling    HPI GWENEVERE Gomez is a 46 y.o. female.  HPI Patient presents with tongue swelling. Began around 2 hours prior to arrival. Some difficulty swallowing with it. No cough. No trauma. No fevers. Started on lisinopril for recent stroke. No family history of tongue swelling. States she also feels of her left fingers are swollen. States has progressed in the 2 hour since it started.   Past Medical History:  Diagnosis Date  . Anemia   . Anxiety   . Bipolar 1 disorder ([place])   . Common migraine 05/19/2015  . Depression   . Hypertension   . Mild mental retardation   . Obesity   . Partial complex seizure disorder with intractable epilepsy ([place]) 05/12/2014  . Seizures ([place])    intractable  . Sleep apnea   . Stroke ([place])   . Type II or unspecified type diabetes mellitus without mention of complication, not stated as uncontrolled     Patient Active Problem List   Diagnosis Date Noted  . Angioedema 06/03/2017  . OSA (obstructive sleep apnea)   . Cerebrovascular accident (CVA) ([place]) 05/20/2017  . Controlled type 2 diabetes mellitus without complication, without long-term current use of insulin ([place]) 05/08/2017  . Essential hypertension 07/27/2008  . Convulsions ([place]) 02/05/2007    Past Surgical History:  Procedure Laterality Date  . COLONOSCOPY     2012-normal , Dr Sharlett Iles  . ESOPHAGOGASTRODUODENOSCOPY     normal-Dr Patterson 2012  . LOOP RECORDER INSERTION N/A 05/30/2017   Procedure: Loop Recorder Insertion;  Surgeon: Constance Haw, MD;  Location: [place] CV LAB;  Service: Cardiovascular;  Laterality: N/A;  . MYRINGOTOMY WITH TUBE PLACEMENT    . NASAL SINUS SURGERY    . TEE WITHOUT CARDIOVERSION N/A 05/30/2017   Procedure: TRANSESOPHAGEAL ECHOCARDIOGRAM (TEE);  Surgeon: Acie Fredrickson Wonda Cheng, MD;  Location: [facility] ENDOSCOPY;  Service: Cardiovascular;  Laterality: N/A;    OB History    No data available       Home Medications    Prior to Admission medications   Medication Sig Start Date End Date Taking? Authorizing Provider  aspirin 325 MG tablet Take 1 tablet (325 mg total) by mouth daily. 05/23/17  Yes Barton Dubois, MD  atorvastatin (LIPITOR) 10 MG tablet Take 1 tablet (10 mg total) by mouth daily at 6 PM. 05/22/17  Yes Barton Dubois, MD  carvedilol (COREG) 3.125 MG tablet Take 1 tablet (3.125 mg total) by mouth 2 (two) times daily. 05/22/17 05/22/18 Yes Barton Dubois, MD  diclofenac (VOLTAREN) 75 MG EC tablet Take 75 mg by mouth daily.  05/08/17  Yes [provider]  levETIRAcetam (KEPPRA) 500 MG tablet TAKE 1 TABLET BY MOUTH TWICE A DAY 05/20/17  Yes [name], MD  lisinopril (PRINIVIL,ZESTRIL) 20 MG tablet Take 1 tablet (20 mg total) by mouth daily. 05/22/17 05/22/18 Yes Barton Dubois, MD  phenytoin (DILANTIN) 100 MG ER capsule TAKE 2 CAPSULES IN THE MORNING AND 3 CAPSULES IN THE EVENING Patient taking differently: Take 200 mg by mouth See admin instructions. TAKE 2 CAPSULES IN THE MORNING AND 2 CAPSULES IN THE EVENING 04/21/17  Yes [name], MD  sitaGLIPtin (JANUVIA) 50 MG tablet Take 1 tablet (50 mg total) by mouth daily. For control of blood sugar 05/08/17  Yes Sagardia,  Ines Bloomer, MD  torsemide (DEMADEX) 20 MG tablet Take 1 tablet (20 mg total) by mouth daily. 05/08/17  Yes SagardiaInes Bloomer, MD    Family History Family History  Problem Relation Age of Onset  . Diabetes Mother        passed away from accidental death  . Mental illness Father   . Diabetes Daughter   . Cancer Daughter        leukemia  . Cancer Maternal Aunt        ovarian ca    Social History Social History  Substance Use Topics  . Smoking status: Never Smoker  . Smokeless tobacco: Never Used  . Alcohol use No     Allergies   Amoxicillin; Hydrocodone; and  Lisinopril   Review of Systems Review of Systems  Constitutional: Negative for appetite change.  HENT: Positive for trouble swallowing.   Respiratory: Negative for shortness of breath.   Gastrointestinal: Negative for abdominal pain.  Genitourinary: Negative for flank pain.  Musculoskeletal: Negative for back pain.  Neurological: Positive for weakness.  Psychiatric/Behavioral: Negative for confusion.     Physical Exam Updated Vital Signs BP 98/68   Pulse 64   Temp 98.1 F (36.7 C) (Oral)   Resp (!) 22   Ht 5\' 1"  (1.549 m)   Wt 124.1 kg (273 lb 9.5 oz)   LMP 05/20/2017   SpO2 94%   BMI 51.69 kg/m   Physical Exam  Constitutional: She appears well-developed.  HENT:  Head: Atraumatic.  Angioedema of tongue and mildly left cheek and floor mouth. Able to speak but does have somewhat slurred voice. Posterior pharynx has no visualized edema.  Neck: Neck supple.  Cardiovascular: Normal rate.   Pulmonary/Chest: Effort normal.  Abdominal: Soft.  Musculoskeletal:  May have mild edema and left fingers but also could be just her normal body habitus.  Neurological: She is alert.  Skin: Skin is warm.  Psychiatric: She has a normal mood and affect.     ED Treatments / Results  Labs (all labs ordered are listed, but only abnormal results are displayed) Labs Reviewed  GLUCOSE, CAPILLARY - Abnormal; Notable for the following:       Result Value   Glucose-Capillary 189 (*)    All other components within normal limits  I-STAT CHEM 8, ED - Abnormal; Notable for the following:    Chloride 98 (*)    BUN 23 (*)    Creatinine, Ser 1.10 (*)    Glucose, Bld 174 (*)    Calcium, Ion 0.98 (*)    All other components within normal limits  CBG MONITORING, ED - Abnormal; Notable for the following:    Glucose-Capillary 191 (*)    All other components within normal limits  CBC WITH DIFFERENTIAL/PLATELET  CBC  BASIC METABOLIC PANEL  MAGNESIUM  PHOSPHORUS  PHENYTOIN LEVEL, TOTAL    PREPARE FRESH FROZEN PLASMA  ABO/RH    EKG  EKG Interpretation None       Radiology No results found.  Procedures Procedures (including critical care time)  Medications Ordered in ED Medications  heparin injection 5,000 Units (5,000 Units Subcutaneous Given 06/03/17 2219)  0.9 %  sodium chloride infusion (75 mL/hr Intravenous New Bag/Given 06/03/17 1927)  famotidine (PEPCID) IVPB 20 mg premix (not administered)  methylPREDNISolone sodium succinate (SOLU-MEDROL) 40 mg/mL injection 40 mg (40 mg Intravenous Given 06/03/17 2247)  diphenhydrAMINE (BENADRYL) injection 25 mg (25 mg Intravenous Given 06/03/17 2247)  insulin aspart (novoLOG) injection 2-6 Units (4 Units  Subcutaneous Given 06/03/17 2029)  levETIRAcetam (KEPPRA) 500 mg in sodium chloride 0.9 % 100 mL IVPB (0 mg Intravenous Stopped 06/03/17 2111)  phenytoin (DILANTIN) 200 mg in sodium chloride 0.9 % 100 mL IVPB (200 mg Intravenous New Bag/Given 06/03/17 2300)  diphenhydrAMINE (BENADRYL) injection 25 mg (25 mg Intravenous Given 06/03/17 1621)  famotidine (PEPCID) IVPB 20 mg premix (0 mg Intravenous Stopped 06/03/17 1651)  methylPREDNISolone sodium succinate (SOLU-MEDROL) 125 mg/2 mL injection 125 mg (125 mg Intravenous Given 06/03/17 1622)  sodium chloride 0.9 % bolus 1,000 mL (0 mLs Intravenous Stopped 06/03/17 1725)     Initial Impression / Assessment and Plan / ED Course  I have reviewed the triage vital signs and the nursing notes.  Pertinent labs & imaging results that were available during my care of the patient were reviewed by me and considered in my medical decision making (see chart for details).     Patient with angioedema. During time of monitoring in the ER swelling is increased. No impending airway obstruction but is worsening. Discussed with critical care who will admit the patient. Given Benadryl and Pepcid steroids and FFP.  CRITICAL CARE Performed by: Mackie Pai Total critical care time: 30  minutes Critical care time was exclusive of separately billable procedures and treating other patients. Critical care was necessary to treat or prevent imminent or life-threatening deterioration. Critical care was time spent personally by me on the following activities: development of treatment plan with patient and/or surrogate as well as nursing, discussions with consultants, evaluation of patient's response to treatment, examination of patient, obtaining history from patient or surrogate, ordering and performing treatments and interventions, ordering and review of laboratory studies, ordering and review of radiographic studies, pulse oximetry and re-evaluation of patient's condition.   Final Clinical Impressions(s) / ED Diagnoses   Final diagnoses:  Angioedema due to angiotensin converting enzyme inhibitor (ACE-I)    New Prescriptions Current Discharge Medication List       Davonna Belling, MD 06/03/17 2322

## 2017-06-04 DIAGNOSIS — T783XXA Angioneurotic edema, initial encounter: Secondary | ICD-10-CM

## 2017-06-04 LAB — BASIC METABOLIC PANEL
ANION GAP: 8 (ref 5–15)
BUN: 15 mg/dL (ref 6–20)
CHLORIDE: 101 mmol/L (ref 101–111)
CO2: 27 mmol/L (ref 22–32)
Calcium: 7.9 mg/dL — ABNORMAL LOW (ref 8.9–10.3)
Creatinine, Ser: 1.03 mg/dL — ABNORMAL HIGH (ref 0.44–1.00)
GFR calc non Af Amer: >60 mL/min (ref >60)
GLUCOSE: 182 mg/dL — AB (ref 65–99)
Potassium: 4.1 mmol/L (ref 3.5–5.1)
Sodium: 136 mmol/L (ref 135–145)

## 2017-06-04 LAB — PREPARE FRESH FROZEN PLASMA
UNIT DIVISION: 0
Unit division: 0

## 2017-06-04 LAB — CBC
HEMATOCRIT: 33.4 % — AB (ref 36.0–46.0)
HEMOGLOBIN: 10.4 g/dL — AB (ref 12.0–15.0)
MCH: 28.9 pg (ref 26.0–34.0)
MCHC: 31.1 g/dL (ref 30.0–36.0)
MCV: 92.8 fL (ref 78.0–100.0)
Platelets: 236 10*3/uL (ref 150–400)
RBC: 3.6 MIL/uL — ABNORMAL LOW (ref 3.87–5.11)
RDW: 13.7 % (ref 11.5–15.5)
WBC: 7.6 10*3/uL (ref 4.0–10.5)

## 2017-06-04 LAB — GLUCOSE, CAPILLARY
GLUCOSE-CAPILLARY: 245 mg/dL — AB (ref 65–99)
Glucose-Capillary: 154 mg/dL — ABNORMAL HIGH (ref 65–99)
Glucose-Capillary: 124 mg/dL — ABNORMAL HIGH (ref 65–99)
Glucose-Capillary: 144 mg/dL — ABNORMAL HIGH (ref 65–99)

## 2017-06-04 LAB — BPAM FFP
BLOOD PRODUCT EXPIRATION DATE: 201806272359
Blood Product Expiration Date: 201806272359
ISSUE DATE / TIME: 201806262035
ISSUE DATE / TIME: 201806261822
UNIT TYPE AND RH: 8400
Unit Type and Rh: 8400

## 2017-06-04 LAB — MRSA PCR SCREENING: MRSA by PCR: NEGATIVE

## 2017-06-04 LAB — MAGNESIUM: Magnesium: 1.6 mg/dL — ABNORMAL LOW (ref 1.7–2.4)

## 2017-06-04 LAB — PHENYTOIN LEVEL, TOTAL: PHENYTOIN LVL: 7.2 ug/mL — AB (ref 10.0–20.0)

## 2017-06-04 LAB — PHOSPHORUS: PHOSPHORUS: 2.6 mg/dL (ref 2.5–4.6)

## 2017-06-04 MED ORDER — PHENYTOIN SODIUM EXTENDED 100 MG PO CAPS
200.0000 mg | ORAL_CAPSULE | Freq: Two times a day (BID) | ORAL | Status: DC
  Administered 2017-06-04: 200 mg via ORAL
  Filled 2017-06-04 (×2): qty 2

## 2017-06-04 MED ORDER — CARVEDILOL 3.125 MG PO TABS
3.1250 mg | ORAL_TABLET | Freq: Two times a day (BID) | ORAL | Status: DC
  Filled 2017-06-04: qty 1

## 2017-06-04 MED ORDER — TORSEMIDE 20 MG PO TABS
20.0000 mg | ORAL_TABLET | Freq: Every day | ORAL | Status: DC
  Administered 2017-06-04: 20 mg via ORAL
  Filled 2017-06-04: qty 1

## 2017-06-04 MED ORDER — ATORVASTATIN CALCIUM 10 MG PO TABS: 10.0000 mg | ORAL_TABLET | Freq: Every day | ORAL | Status: DC

## 2017-06-04 MED ORDER — FAMOTIDINE 20 MG PO TABS
20.0000 mg | ORAL_TABLET | Freq: Every day | ORAL | Status: DC
  Administered 2017-06-04: 20 mg via ORAL
  Filled 2017-06-04: qty 1

## 2017-06-04 MED ORDER — FAMOTIDINE 20 MG PO TABS: 20.0000 mg | ORAL_TABLET | Freq: Two times a day (BID) | ORAL | 0 refills | Status: DC

## 2017-06-04 MED ORDER — PREDNISONE 10 MG PO TABS: ORAL_TABLET | ORAL | 0 refills | Status: DC

## 2017-06-04 MED ORDER — DIPHENHYDRAMINE HCL 25 MG PO CAPS: 25.0000 mg | ORAL_CAPSULE | Freq: Two times a day (BID) | ORAL | 0 refills | Status: DC

## 2017-06-04 MED ORDER — LEVETIRACETAM 500 MG PO TABS
500.0000 mg | ORAL_TABLET | Freq: Two times a day (BID) | ORAL | Status: DC
  Administered 2017-06-04: 500 mg via ORAL
  Filled 2017-06-04: qty 1

## 2017-06-04 MED ORDER — ASPIRIN 325 MG PO TABS
325.0000 mg | ORAL_TABLET | Freq: Every day | ORAL | Status: DC
  Administered 2017-06-04: 325 mg via ORAL
  Filled 2017-06-04: qty 1

## 2017-06-04 MED ORDER — DICLOFENAC SODIUM 75 MG PO TBEC
75.0000 mg | DELAYED_RELEASE_TABLET | Freq: Every day | ORAL | Status: DC
  Administered 2017-06-04: 75 mg via ORAL
  Filled 2017-06-04: qty 1

## 2017-06-04 MED ORDER — PREDNISONE 20 MG PO TABS
40.0000 mg | ORAL_TABLET | Freq: Every day | ORAL | Status: DC
  Administered 2017-06-04: 40 mg via ORAL
  Filled 2017-06-04: qty 2

## 2017-06-04 NOTE — Discharge Instructions (Signed)
Angioedema  Angioedema is sudden swelling in the body. The swelling can happen in any part of the body. It often happens on the skin and causes itchy, bumpy patches (hives) to form.  This condition may:  · Happen only one time.  · Happen more than one time. It may come back at random times.  · Keep coming back for a number of years. Someday it may stop coming back.    Follow these instructions at home:  · Take over-the-counter and prescription medicines only as told by your doctor.  · If you were given medicines for emergency allergy treatment, always carry them with you.  · Wear a medical bracelet as told by your doctor.  · Avoid the things that cause your attacks (triggers).  · If this condition was passed to you from your parents and you want to have kids, talk to your doctor. Your kids may also have this condition.  Contact a doctor if:  · You have another attack.  · Your attacks happen more often, even after you take steps to prevent them.  · This condition was passed to you by your parents and you want to have kids.  Get help right away if:  · Your mouth, tongue, or lips get very swollen.  · You have trouble breathing.  · You have trouble swallowing.  · You pass out (faint).  This information is not intended to replace advice given to you by your health care provider. Make sure you discuss any questions you have with your health care provider.  Document Released: 11/13/2009 Document Revised: 06/26/2016 Document Reviewed: 06/04/2016  Elsevier Interactive Patient Education © 2017 Elsevier Inc.

## 2017-06-04 NOTE — Evaluation (Signed)
Clinical/Bedside Swallow Evaluation Patient Details  Name: Brandi Gomez MRN: [id number] Date of Birth: Jan 22, 1971  Today's Date: 06/04/2017 Time: SLP Start Time (ACUTE ONLY): 0914 SLP Stop Time (ACUTE ONLY): 0924 SLP Time Calculation (min) (ACUTE ONLY): 10 min  Past Medical History:  Past Medical History:  Diagnosis Date  . Anemia   . Anxiety   . Bipolar 1 disorder ([place])   . Common migraine 05/19/2015  . Depression   . Hypertension   . Mild mental retardation   . Obesity   . Partial complex seizure disorder with intractable epilepsy ([place]) 05/12/2014  . Seizures ([place])    intractable  . Sleep apnea   . Stroke ([place])   . Type II or unspecified type diabetes mellitus without mention of complication, not stated as uncontrolled    Past Surgical History:  Past Surgical History:  Procedure Laterality Date  . COLONOSCOPY     2012-normal , Dr Sharlett Iles  . ESOPHAGOGASTRODUODENOSCOPY     normal-Dr Patterson 2012  . LOOP RECORDER INSERTION N/A 05/30/2017   Procedure: Loop Recorder Insertion;  Surgeon: Constance Haw, MD;  Location: [place] CV LAB;  Service: Cardiovascular;  Laterality: N/A;  . MYRINGOTOMY WITH TUBE PLACEMENT    . NASAL SINUS SURGERY    . TEE WITHOUT CARDIOVERSION N/A 05/30/2017   Procedure: TRANSESOPHAGEAL ECHOCARDIOGRAM (TEE);  Surgeon: Acie Fredrickson Wonda Cheng, MD;  Location: [facility] ENDOSCOPY;  Service: Cardiovascular;  Laterality: N/A;   HPI:  46 year old female admitted with angioedema. She was recently d/c on 6/14 after acute CVA, at which time SLP was ordered for speech/language evaluation only. PMH includes HTN, DM, epilepsy, OSA, CVA, and self-reported GER.   Assessment / Plan / Recommendation Clinical Impression  Pt's oropharyngeal swallow appears to be [facility], with no overt signs of aspiration. She believes her swelling has cleared, and there is no obvious lingual edema during her oral motor exam. She self-reports a h/o reflux and describes what seems to be  consistent with a globus sensation intermittently when eating or swallowing pills, but this is a more chronic issue. SLP provided education about general aspiration and esophageal precautions. Would continue with a regular diet and thin liquids as tolerated - SLP to sign off. SLP Visit Diagnosis: Dysphagia, oral phase (R13.11)    Aspiration Risk  No limitations    Diet Recommendation Regular;Thin liquid   Liquid Administration via: Cup;Straw Medication Administration: Whole meds with liquid Supervision: Patient able to self feed;Intermittent supervision to cue for compensatory strategies Compensations: Slow rate;Small sips/bites;Follow solids with liquid Postural Changes: Seated upright at 90 degrees;Remain upright for at least 30 minutes after po intake    Other  Recommendations Oral Care Recommendations: Oral care BID   Follow up Recommendations None      Frequency and Duration            Prognosis        Swallow Study   General HPI: 46 year old female admitted with angioedema. She was recently d/c on 6/14 after acute CVA, at which time SLP was ordered for speech/language evaluation only. PMH includes HTN, DM, epilepsy, OSA, CVA, and self-reported GER. Type of Study: Bedside Swallow Evaluation Previous Swallow Assessment: none in chart Diet Prior to this Study: Regular;Thin liquids (initiated this am, pt has not eaten yet) Temperature Spikes Noted: No Respiratory Status: Room air History of Recent Intubation: No Behavior/Cognition: Alert;Cooperative;Pleasant mood Oral Cavity Assessment: Within Functional Limits Oral Care Completed by SLP: No Oral Cavity - Dentition: Adequate natural dentition  Vision: Functional for self-feeding Self-Feeding Abilities: Able to feed self Patient Positioning: Upright in bed Baseline Vocal Quality: Normal Volitional Cough: Strong Volitional Swallow: Able to elicit    Oral/Motor/Sensory Function Overall Oral Motor/Sensory Function: Within  functional limits   Ice Chips Ice chips: Not tested   Thin Liquid Thin Liquid: Within functional limits Presentation: Self Fed;Straw    Nectar Thick Nectar Thick Liquid: Not tested   Honey Thick Honey Thick Liquid: Not tested   Puree Puree: Within functional limits Presentation: Self Fed;Spoon   Solid   GO   Solid: Within functional limits Presentation: Self Ennis Forts 06/04/2017,9:39 AM   Germain Osgood, M.A. CCC-SLP [phone]

## 2017-06-04 NOTE — Care Management Note (Addendum)
Case Management Note  Patient Details  Name: Brandi Gomez MRN: [id number] Date of Birth: June 01, 1971  Subjective/Objective:   Pt admitted on 06/03/17 with angioedema, likely caused by ACE-I.  PTA, pt resided at home with daughter.  Pt has hx of OSA, but reportedly has not used CPAP in 6 months due to financial reasons.  She is active with [facility] [place].  Her PCP is Dr. Mitchel Honour on [address]                   Action/Plan: Met with pt to discuss discharge needs.  Pt states she has a CPAP machine from [facility] that currently is not working properly.  She has been unable to get supplies from [facility] for some time, per her report, due to a lapse in her Medicaid.  Her Medicaid has now been reinstated, and she wants help with her CPAP/supplies.   I spoke with Santiago Glad from [facility]; she states pt is currently in good standing with [facility] and get CPAP supplies and repair of machine covered with her Medicaid.  She states pt received machine in 2014, and it is paid for.  The last time pt received supplies was January of 2017.  Will request CPAP supplies and technician to look at her machine for likely repair from [facility].   Will put information on AVS for patient to call to order further supplies or have machine serviced.  Pt pleased with this information.   Notified [facility] of pt discharging home today.  They will resume HHPT at dc.    Expected Discharge Date:  06/04/17               Expected Discharge Plan:  [place]  In-House Referral:     Discharge planning Services  CM Consult  Post Acute Care Choice:  Resumption of Svcs/PTA Provider, Durable Medical Equipment Choice offered to:  Patient  DME Arranged:    DME Agency:     HH Arranged:  PT [place]:  [facility] (now [facility])  Status of Service:  Completed, signed off  If discussed at [place] of Stay Meetings, dates discussed:    Additional Comments:  Reinaldo Raddle, RN, BSN  Trauma/Neuro ICU  Case Manager [phone]

## 2017-06-04 NOTE — Care Management CC44 (Signed)
Condition Code 44 Documentation Completed  Patient Details  Name: Brandi Gomez MRN: [id number] Date of Birth: 1971/08/14   Condition Code 44 given:  Yes Patient signature on Condition Code 44 notice:  Yes Documentation of 2 MD's agreement:  Yes Code 44 added to claim:  Yes    Ella Bodo, RN 06/04/2017, 12:13 PM

## 2017-06-04 NOTE — Care Management Obs Status (Signed)
[place] NOTIFICATION   Patient Details  Name: Brandi Gomez MRN: [id number] Date of Birth: 1971/05/06   Medicare Observation Status Notification Given:  Yes    Ella Bodo, RN 06/04/2017, 12:11 PM

## 2017-06-04 NOTE — Care Management Obs Status (Signed)
[place] NOTIFICATION   Patient Details  Name: QUINA WILBOURNE MRN: [id number] Date of Birth: [birth year]   Medicare Observation Status Notification Given:  Yes    Ella Bodo, RN 06/04/2017, 12:11 PM

## 2017-06-04 NOTE — Discharge Summary (Signed)
Physician Discharge Summary  Patient ID: Brandi Gomez MRN: [id number] DOB/AGE: March 28, 1971 46 y.o.  Admit date: 06/03/2017 Discharge date: 06/04/2017    Discharge Diagnoses:  Active Problems:   Angioedema                                                       D/c plan by Discharge Diagnosis  Angioedema likely secondary to new ACE-i OSA on CPAP (has not used for 6 months due to financial reasons) D/c plan --  continue Prednisone with taper  pepcid BID x3 days  Benadryl BID x3 days   Case management to work on CPAP for home  F/u with neurology for repeat sleep study if needed   Epilepsy Recent CVA D/c plan --  Resume outpt dilantin, keppra  Neuro f/u  Cont asa  Chronic systolic/diastolic CHF HTN HLD D/c plan --  NO ACE - listed as allergy  Continue outpt coreg, torsemide, atorvastatin  PCP f/u    DM2  D/c plan --  Continue outpt sitagliptin  PCP f/u   Brief Summary: Brandi Gomez is a 46 y.o. y/o female with a PMH of HTN, DM, epilepsy, and OSA on CPAP (non-compliant for financial reasons). She was recently admitted to [facility] for CVA 6/12. She was discharged on 6/14 with new prescriptions for coreg and lisinopril. Etiology of stroke was not discovered and she had implantable device placed to monitor her cardiac rhythm 6/19. 6/26 she presented to [facility] ED with complaints of tongue swelling and difficulty swallowing. She says this was preceded by a few days of finger and toe swelling as well. This progressed for about an hour and she presented to the emergency department. She was given solu-medrol, famotidine, and benadryl in ED and admitted to ICU for close observation.  Overnight she improved quickly with the above treatments.  On 6/27 am she feels "back to normal" and denies any residual tongue/mouth swelling.  She was seen by SLP and is eating and drinking without difficulty and swallowing meds.  Lisinopril has been listed as an allergy.  Case management  will f/u regarding home CPAP.  Pt is much improved, near baseline with resolved mouth/tongue swelling and ready for d/c home with close outpt f/u.    Consults: none  Lines/tubes: none  Microbiology/Sepsis markers:   Significant Diagnostic Studies:      Vitals:   06/04/17 0500 06/04/17 0600 06/04/17 0700 06/04/17 0800  BP: 94/63 107/70 (!) 104/59 111/76  Pulse: (!) 59 (!) 59 (!) 53 (!) 54  Resp: _0 Temp:    97.9 F (36.6 C)  TempSrc:    Oral  SpO2: 100% 100% 99% 99%  Weight:      Height:         Discharge Labs  BMET  Recent Labs Lab 06/03/17 1618 06/04/17 0040  NA 137 136  K 3.8 4.1  CL 98* 101  CO2  --  27  GLUCOSE 174* 182*  BUN 23* 15  CREATININE 1.10* 1.03*  CALCIUM  --  7.9*  MG  --  1.6*  PHOS  --  2.6     CBC   Recent Labs Lab 06/03/17 1553 06/03/17 1618 06/04/17 0040  HGB 12.0 12.6 10.4*  HCT 36.8 37.0 33.4*  WBC 8.7  --  7.6  PLT 269  --  236   Anti-Coagulation No results for input(s): INR in the last 168 hours.  Discharge Instructions    Diet - low sodium heart healthy    Complete by:  As directed    Increase activity slowly    Complete by:  As directed    Other Restrictions    Complete by:  As directed    Caution - benadryl may cause drowsiness       Follow-up Information    Horald Pollen, MD. Schedule an appointment as soon as possible for a visit in 2 day(s).   Specialty:  Internal Medicine Contact information: [place] [address] [phone]            Allergies as of 06/04/2017      Reactions   Amoxicillin Itching   Has patient had a PCN reaction causing immediate rash, facial/tongue/throat swelling, SOB or lightheadedness with hypotension: yes Has patient had a PCN reaction causing severe rash involving mucus membranes or skin necrosis: no Has patient had a PCN reaction that required hospitalization: no Has patient had a PCN reaction occurring within the last 10 years:  yes If all of the above answers are "NO", then may proceed with Cephalosporin use.   Lisinopril Swelling   Angioedema    Hydrocodone Other (See Comments)   Depressed       Medication List    STOP taking these medications   lisinopril 20 MG tablet Commonly known as:  PRINIVIL,ZESTRIL     TAKE these medications   aspirin 325 MG tablet Take 1 tablet (325 mg total) by mouth daily.   atorvastatin 10 MG tablet Commonly known as:  LIPITOR Take 1 tablet (10 mg total) by mouth daily at 6 PM.   carvedilol 3.125 MG tablet Commonly known as:  COREG Take 1 tablet (3.125 mg total) by mouth 2 (two) times daily.   diclofenac 75 MG EC tablet Commonly known as:  VOLTAREN Take 75 mg by mouth daily.   diphenhydrAMINE 25 mg capsule Commonly known as:  BENADRYL Take 1 capsule (25 mg total) by mouth 2 (two) times daily.   famotidine 20 MG tablet Commonly known as:  PEPCID Take 1 tablet (20 mg total) by mouth 2 (two) times daily.   levETIRAcetam 500 MG tablet Commonly known as:  KEPPRA TAKE 1 TABLET BY MOUTH TWICE A DAY   phenytoin 100 MG ER capsule Commonly known as:  DILANTIN TAKE 2 CAPSULES IN THE MORNING AND 3 CAPSULES IN THE EVENING What changed:  how much to take  how to take this  when to take this  additional instructions   predniSONE 10 MG tablet Commonly known as:  DELTASONE Take 4 tabs PO daily x 2 days, then 3 tabs PO daily x 2 days, then 2 tabs PO daily x 2 days, then 1 tab PO daily x 2 days, then STOP   sitaGLIPtin 50 MG tablet Commonly known as:  JANUVIA Take 1 tablet (50 mg total) by mouth daily. For control of blood sugar   torsemide 20 MG tablet Commonly known as:  DEMADEX Take 1 tablet (20 mg total) by mouth daily.         Disposition: 01-Home or Self Care  Discharged Condition: Brandi Gomez has met maximum benefit of inpatient care and is medically stable and cleared for discharge.  Patient is pending follow up as above.      Time spent  on disposition:  Greater than 35  minutes.   SignedNickolas Madrid, NP 06/04/2017  11:45 AM Pager: [phone] or [phone]

## 2017-06-04 NOTE — Progress Notes (Signed)
Pt d/c to home by car with family. Assessment stable. Prescriptions given. All questions answered. 

## 2017-06-04 NOTE — Telephone Encounter (Signed)
Excellent. Thanks!

## 2017-06-04 NOTE — Telephone Encounter (Signed)
FYI

## 2017-06-05 DIAGNOSIS — F71 Moderate intellectual disabilities: Secondary | ICD-10-CM | POA: Diagnosis not present

## 2017-06-05 DIAGNOSIS — E119 Type 2 diabetes mellitus without complications: Secondary | ICD-10-CM | POA: Diagnosis not present

## 2017-06-05 DIAGNOSIS — Z7984 Long term (current) use of oral hypoglycemic drugs: Secondary | ICD-10-CM | POA: Diagnosis not present

## 2017-06-05 DIAGNOSIS — I69354 Hemiplegia and hemiparesis following cerebral infarction affecting left non-dominant side: Secondary | ICD-10-CM | POA: Diagnosis not present

## 2017-06-05 DIAGNOSIS — F319 Bipolar disorder, unspecified: Secondary | ICD-10-CM | POA: Diagnosis not present

## 2017-06-05 DIAGNOSIS — I10 Essential (primary) hypertension: Secondary | ICD-10-CM | POA: Diagnosis not present

## 2017-06-05 NOTE — Progress Notes (Signed)
SLP Note (late entry):   06/04/17 0900  SLP G-Codes **NOT FOR INPATIENT CLASS**  Functional Assessment Tool Used skilled clinical judgment  Functional Limitations Swallowing  Swallow Current Status (E3329) CI  Swallow Goal Status (J1884) CI  Swallow Discharge Status (Z6606) CI  SLP Evaluations  $ SLP Speech Visit 1 Procedure  SLP Evaluations  $BSS Swallow 1 Procedure   Germain Osgood, M.A. CCC-SLP [phone]

## 2017-06-06 ENCOUNTER — Encounter: Payer: Self-pay | Admitting: Emergency Medicine

## 2017-06-06 ENCOUNTER — Ambulatory Visit (INDEPENDENT_AMBULATORY_CARE_PROVIDER_SITE_OTHER): Payer: Medicare HMO | Admitting: Emergency Medicine

## 2017-06-06 VITALS — BP 146/89 | HR 62 | Temp 98.3°F | Resp 18 | Ht 61.0 in | Wt 276.2 lb

## 2017-06-06 DIAGNOSIS — I69354 Hemiplegia and hemiparesis following cerebral infarction affecting left non-dominant side: Secondary | ICD-10-CM | POA: Diagnosis not present

## 2017-06-06 DIAGNOSIS — F319 Bipolar disorder, unspecified: Secondary | ICD-10-CM | POA: Diagnosis not present

## 2017-06-06 DIAGNOSIS — Z7984 Long term (current) use of oral hypoglycemic drugs: Secondary | ICD-10-CM | POA: Diagnosis not present

## 2017-06-06 DIAGNOSIS — E119 Type 2 diabetes mellitus without complications: Secondary | ICD-10-CM | POA: Diagnosis not present

## 2017-06-06 DIAGNOSIS — Z8673 Personal history of transient ischemic attack (TIA), and cerebral infarction without residual deficits: Secondary | ICD-10-CM | POA: Diagnosis not present

## 2017-06-06 DIAGNOSIS — R109 Unspecified abdominal pain: Secondary | ICD-10-CM

## 2017-06-06 DIAGNOSIS — I10 Essential (primary) hypertension: Secondary | ICD-10-CM

## 2017-06-06 DIAGNOSIS — G8929 Other chronic pain: Secondary | ICD-10-CM

## 2017-06-06 DIAGNOSIS — F71 Moderate intellectual disabilities: Secondary | ICD-10-CM | POA: Diagnosis not present

## 2017-06-06 MED ORDER — GLUCOSE BLOOD VI STRP: ORAL_STRIP | 12 refills | Status: DC

## 2017-06-06 MED ORDER — ACCU-CHEK SOFTCLIX LANCETS MISC: 12 refills | Status: DC

## 2017-06-06 NOTE — Patient Instructions (Addendum)
IF you received an x-ray today, you will receive an invoice from [facility]. Please contact [facility] at [phone] with questions or concerns regarding your invoice.   IF you received labwork today, you will receive an invoice from [facility]. Please contact [facility] at [phone] with questions or concerns regarding your invoice.   Our billing staff will not be able to assist you with questions regarding bills from these companies.  You will be contacted with the lab results as soon as they are available. The fastest way to get your results is to activate your My Chart account. Instructions are located on the last page of this paperwork. If you have not heard from [place] regarding the results in 2 weeks, please contact this office.      Type 2 Diabetes Mellitus, Diagnosis, Adult Type 2 diabetes (type 2 diabetes mellitus) is a long-term (chronic) disease. It may be caused by one or both of these problems:  Your body does not make enough of a hormone called insulin.  Your body does not react in a normal way to insulin that it makes.  Insulin lets sugars (glucose) go into cells in the body. This gives you energy. If you have type 2 diabetes, sugars cannot get into cells. This causes high blood sugar (hyperglycemia). Your doctor will set treatment goals for you. Generally, you should have these blood sugar levels:  Before meals (preprandial): 80-130 mg/dL (4.4-7.2 mmol/L).  After meals (postprandial): below 180 mg/dL (10 mmol/L).  A1c (hemoglobin A1c) level: less than 7%.  Follow these instructions at home: Questions to Ask Your Doctor  You may want to ask these questions:  Do I need to meet with a diabetes educator?  Where can I find a support group for people with diabetes?  What equipment will I need to care for myself at home?  What diabetes medicines do I need? When should I take them?  How often do I need to check my blood sugar?  What number  can I call if I have questions?  When is my next doctor's visit?  General instructions  Take over-the-counter and prescription medicines only as told by your doctor.  Keep all follow-up visits as told by your doctor. This is important. Contact a doctor if:  Your blood sugar is at or above 240 mg/dL (13.3 mmol/L) for 2 days in a row.  You have been sick or have had a fever for 2 days or more and you are not getting better.  You have any of these problems for more than 6 hours: ? You cannot eat or drink. ? You feel sick to your stomach (nauseous). ? You throw up (vomit). ? You have watery poop (diarrhea). Get help right away if:  Your blood sugar is lower than 54 mg/dL (3 mmol/L).  You get confused.  You have trouble: ? Thinking clearly. ? Breathing.  You have moderate or large ketone levels in your pee (urine). This information is not intended to replace advice given to you by your health care provider. Make sure you discuss any questions you have with your health care provider. Document Released: 09/03/2008 Document Revised: 05/02/2016 Document Reviewed: 12/29/2015 Elsevier Interactive Patient Education  2018 [employer].  Hypertension Hypertension is another name for high blood pressure. High blood pressure forces your heart to work harder to pump blood. This can cause problems over time. There are two numbers in a blood pressure reading. There is a top number (systolic) over a bottom  number (diastolic). It is best to have a blood pressure below 120/80. Healthy choices can help lower your blood pressure. You may need medicine to help lower your blood pressure if:  Your blood pressure cannot be lowered with healthy choices.  Your blood pressure is higher than 130/80.  Follow these instructions at home: Eating and drinking  If directed, follow the DASH eating plan. This diet includes: ? Filling half of your plate at each meal with fruits and vegetables. ? Filling one  quarter of your plate at each meal with whole grains. Whole grains include whole wheat pasta, brown rice, and whole grain bread. ? Eating or drinking low-fat dairy products, such as skim milk or low-fat yogurt. ? Filling one quarter of your plate at each meal with low-fat (lean) proteins. Low-fat proteins include fish, skinless chicken, eggs, beans, and tofu. ? Avoiding fatty meat, cured and processed meat, or chicken with skin. ? Avoiding premade or processed food.  Eat less than 1,500 mg of salt (sodium) a day.  Limit alcohol use to no more than 1 drink a day for nonpregnant women and 2 drinks a day for men. One drink equals 12 oz of beer, 5 oz of wine, or 1 oz of hard liquor. Lifestyle  Work with your doctor to stay at a healthy weight or to lose weight. Ask your doctor what the best weight is for you.  Get at least 30 minutes of exercise that causes your heart to beat faster (aerobic exercise) most days of the week. This may include walking, swimming, or biking.  Get at least 30 minutes of exercise that strengthens your muscles (resistance exercise) at least 3 days a week. This may include lifting weights or pilates.  Do not use any products that contain nicotine or tobacco. This includes cigarettes and e-cigarettes. If you need help quitting, ask your doctor.  Check your blood pressure at home as told by your doctor.  Keep all follow-up visits as told by your doctor. This is important. Medicines  Take over-the-counter and prescription medicines only as told by your doctor. Follow directions carefully.  Do not skip doses of blood pressure medicine. The medicine does not work as well if you skip doses. Skipping doses also puts you at risk for problems.  Ask your doctor about side effects or reactions to medicines that you should watch for. Contact a doctor if:  You think you are having a reaction to the medicine you are taking.  You have headaches that keep coming back  (recurring).  You feel dizzy.  You have swelling in your ankles.  You have trouble with your vision. Get help right away if:  You get a very bad headache.  You start to feel confused.  You feel weak or numb.  You feel faint.  You get very bad pain in your: ? Chest. ? Belly (abdomen).  You throw up (vomit) more than once.  You have trouble breathing. Summary  Hypertension is another name for high blood pressure.  Making healthy choices can help lower blood pressure. If your blood pressure cannot be controlled with healthy choices, you may need to take medicine. This information is not intended to replace advice given to you by your health care provider. Make sure you discuss any questions you have with your health care provider. Document Released: 05/13/2008 Document Revised: 10/23/2016 Document Reviewed: 10/23/2016 Elsevier Interactive Patient Education  [employer].

## 2017-06-06 NOTE — Progress Notes (Signed)
Brandi Gomez 46 y.o.   Chief Complaint  Patient presents with  . Other    Otalgia   . Follow-up    HISTORY OF PRESENT ILLNESS: This is a 46 y.o. female here for follow-up; has no complaints; doing well; s/p right perirolandic stroke 6/12; had angioedema due to ACE inhibitor 6/26.  HPI   Prior to Admission medications   Medication Sig Start Date End Date Taking? Authorizing Provider  aspirin 325 MG tablet Take 1 tablet (325 mg total) by mouth daily. 05/23/17  Yes Barton Dubois, MD  atorvastatin (LIPITOR) 10 MG tablet Take 1 tablet (10 mg total) by mouth daily at 6 PM. 05/22/17  Yes Barton Dubois, MD  carvedilol (COREG) 3.125 MG tablet Take 1 tablet (3.125 mg total) by mouth 2 (two) times daily. 05/22/17 05/22/18 Yes Barton Dubois, MD  diclofenac (VOLTAREN) 75 MG EC tablet Take 75 mg by mouth daily.  05/08/17  Yes [provider]  diphenhydrAMINE (BENADRYL) 25 mg capsule Take 1 capsule (25 mg total) by mouth 2 (two) times daily. 06/04/17 06/06/17 Yes Whiteheart, Cristal Ford, NP  famotidine (PEPCID) 20 MG tablet Take 1 tablet (20 mg total) by mouth 2 (two) times daily. 06/04/17 06/08/17 Yes Whiteheart, Cristal Ford, NP  levETIRAcetam (KEPPRA) 500 MG tablet TAKE 1 TABLET BY MOUTH TWICE A DAY 05/20/17  Yes [name], MD  phenytoin (DILANTIN) 100 MG ER capsule TAKE 2 CAPSULES IN THE MORNING AND 3 CAPSULES IN THE EVENING Patient taking differently: Take 200 mg by mouth See admin instructions. TAKE 2 CAPSULES IN THE MORNING AND 2 CAPSULES IN THE EVENING 04/21/17  Yes [name], MD  predniSONE (DELTASONE) 10 MG tablet Take 4 tabs PO daily x 2 days, then 3 tabs PO daily x 2 days, then 2 tabs PO daily x 2 days, then 1 tab PO daily x 2 days, then STOP 06/04/17  Yes Whiteheart, Cristal Ford, NP  sitaGLIPtin (JANUVIA) 50 MG tablet Take 1 tablet (50 mg total) by mouth daily. For control of blood sugar 05/08/17  Yes Loriel Diehl, Ines Bloomer, MD  torsemide (DEMADEX) 20 MG tablet Take 1 tablet  (20 mg total) by mouth daily. 05/08/17  Yes Horald Pollen, MD    Allergies  Allergen Reactions  . Amoxicillin Itching    Has patient had a PCN reaction causing immediate rash, facial/tongue/throat swelling, SOB or lightheadedness with hypotension: yes Has patient had a PCN reaction causing severe rash involving mucus membranes or skin necrosis: no Has patient had a PCN reaction that required hospitalization: no Has patient had a PCN reaction occurring within the last 10 years: yes If all of the above answers are "NO", then may proceed with Cephalosporin use.  Marland Kitchen Lisinopril Swelling    Angioedema   . Hydrocodone Other (See Comments)    Depressed     Patient Active Problem List   Diagnosis Date Noted  . Angioedema 06/03/2017  . OSA (obstructive sleep apnea)   . Cerebrovascular accident (CVA) ([place]) 05/20/2017  . Controlled type 2 diabetes mellitus without complication, without long-term current use of insulin ([place]) 05/08/2017  . Essential hypertension 07/27/2008  . Convulsions ([place]) 02/05/2007    Past Medical History:  Diagnosis Date  . Anemia   . Anxiety   . Bipolar 1 disorder ([place])   . Common migraine 05/19/2015  . Depression   . Hypertension   . Mild mental retardation   . Obesity   . Partial complex seizure disorder with intractable epilepsy ([place]) 05/12/2014  .  Seizures ([place])    intractable  . Sleep apnea   . Stroke ([place])   . Type II or unspecified type diabetes mellitus without mention of complication, not stated as uncontrolled     Past Surgical History:  Procedure Laterality Date  . COLONOSCOPY     2012-normal , Dr Sharlett Iles  . ESOPHAGOGASTRODUODENOSCOPY     normal-Dr Patterson 2012  . LOOP RECORDER INSERTION N/A 05/30/2017   Procedure: Loop Recorder Insertion;  Surgeon: Constance Haw, MD;  Location: [place] [facility];  Service: Cardiovascular;  Laterality: N/A;  . MYRINGOTOMY WITH TUBE PLACEMENT    . NASAL SINUS SURGERY    . TEE WITHOUT  CARDIOVERSION N/A 05/30/2017   Procedure: TRANSESOPHAGEAL ECHOCARDIOGRAM (TEE);  Surgeon: Acie Fredrickson Wonda Cheng, MD;  Location: [facility];  Service: Cardiovascular;  Laterality: N/A;    Social History   Social History  . Marital status: Single    Spouse name: N/A  . Number of children: 3  . Years of education: 12   Occupational History  . disabled   .  Disabled   Social History Main Topics  . Smoking status: Never Smoker  . Smokeless tobacco: Never Used  . Alcohol use No  . Drug use: No  . Sexual activity: Not on file   Other Topics Concern  . Not on file   Social History Narrative   Patient lives at home with daughter.    Patient has 3 children.    Patient is right handed.    Patient has a high school education.    Patient is on disability   Patient drinks 2 cups of caffeine daily.    Family History  Problem Relation Age of Onset  . Diabetes Mother        passed away from accidental death  . Mental illness Father   . Diabetes Daughter   . Cancer Daughter        leukemia  . Cancer Maternal Aunt        ovarian ca     Review of Systems  Constitutional: Negative.  Negative for chills and fever.  HENT: Negative.   Eyes: Negative.   Respiratory: Negative.  Negative for cough and shortness of breath.   Cardiovascular: Negative.  Negative for chest pain and palpitations.  Gastrointestinal: Positive for abdominal pain (chronic). Negative for nausea and vomiting.  Genitourinary: Negative.  Negative for dysuria.  Skin: Negative.  Negative for rash.  Neurological: Negative.  Negative for dizziness and headaches.  Endo/Heme/Allergies: Negative.   All other systems reviewed and are negative.  Vitals:   06/06/17 0949  BP: (!) 146/89  Pulse: 62  Resp: 18  Temp: 98.3 F (36.8 C)    Physical Exam  Constitutional: She is oriented to person, place, and time. She appears well-developed.  Obese.  HENT:  Head: Normocephalic and atraumatic.  Eyes: Conjunctivae and EOM  are normal. Pupils are equal, round, and reactive to light.  Neck: Normal range of motion. Neck supple. No JVD present.  Cardiovascular: Normal rate, regular rhythm, normal heart sounds and intact distal pulses.   Pulmonary/Chest: Effort normal and breath sounds normal.  Abdominal: Soft. She exhibits no distension. There is no tenderness.  Musculoskeletal: Normal range of motion.  Neurological: She is alert and oriented to person, place, and time. No sensory deficit. She exhibits normal muscle tone.  Skin: Skin is warm and dry. Capillary refill takes less than 2 seconds. No rash noted.  Psychiatric: She has a normal mood and affect.  Her behavior is normal.  Vitals reviewed.    ASSESSMENT & PLAN: Carlynn was seen today for other and follow-up.  Diagnoses and all orders for this visit:  Essential hypertension  Controlled type 2 diabetes mellitus without complication, without long-term current use of insulin (HCC) -     ACCU-CHEK SOFTCLIX LANCETS lancets; Use as instructed -     glucose blood test strip; Use as instructed  Chronic abdominal pain  History of recent stroke    Patient Instructions       IF you received an x-ray today, you will receive an invoice from [facility]. Please contact [facility] at [phone] with questions or concerns regarding your invoice.   IF you received labwork today, you will receive an invoice from [place]. Please contact [facility] at [phone] with questions or concerns regarding your invoice.   Our billing staff will not be able to assist you with questions regarding bills from these companies.  You will be contacted with the lab results as soon as they are available. The fastest way to get your results is to activate your My Chart account. Instructions are located on the last page of this paperwork. If you have not heard from [place] regarding the results in 2 weeks, please contact this office.      Type 2 Diabetes  Mellitus, Diagnosis, Adult Type 2 diabetes (type 2 diabetes mellitus) is a long-term (chronic) disease. It may be caused by one or both of these problems:  Your body does not make enough of a hormone called insulin.  Your body does not react in a normal way to insulin that it makes.  Insulin lets sugars (glucose) go into cells in the body. This gives you energy. If you have type 2 diabetes, sugars cannot get into cells. This causes high blood sugar (hyperglycemia). Your doctor will set treatment goals for you. Generally, you should have these blood sugar levels:  Before meals (preprandial): 80-130 mg/dL (4.4-7.2 mmol/L).  After meals (postprandial): below 180 mg/dL (10 mmol/L).  A1c (hemoglobin A1c) level: less than 7%.  Follow these instructions at home: Questions to Ask Your Doctor  You may want to ask these questions:  Do I need to meet with a diabetes educator?  Where can I find a support group for people with diabetes?  What equipment will I need to care for myself at home?  What diabetes medicines do I need? When should I take them?  How often do I need to check my blood sugar?  What number can I call if I have questions?  When is my next doctor's visit?  General instructions  Take over-the-counter and prescription medicines only as told by your doctor.  Keep all follow-up visits as told by your doctor. This is important. Contact a doctor if:  Your blood sugar is at or above 240 mg/dL (13.3 mmol/L) for 2 days in a row.  You have been sick or have had a fever for 2 days or more and you are not getting better.  You have any of these problems for more than 6 hours: ? You cannot eat or drink. ? You feel sick to your stomach (nauseous). ? You throw up (vomit). ? You have watery poop (diarrhea). Get help right away if:  Your blood sugar is lower than 54 mg/dL (3 mmol/L).  You get confused.  You have trouble: ? Thinking clearly. ? Breathing.  You have  moderate or large ketone levels in your pee (urine). This information is  not intended to replace advice given to you by your health care provider. Make sure you discuss any questions you have with your health care provider. Document Released: 09/03/2008 Document Revised: 05/02/2016 Document Reviewed: 12/29/2015 Elsevier Interactive Patient Education  2018 [employer].  Hypertension Hypertension is another name for high blood pressure. High blood pressure forces your heart to work harder to pump blood. This can cause problems over time. There are two numbers in a blood pressure reading. There is a top number (systolic) over a bottom number (diastolic). It is best to have a blood pressure below 120/80. Healthy choices can help lower your blood pressure. You may need medicine to help lower your blood pressure if:  Your blood pressure cannot be lowered with healthy choices.  Your blood pressure is higher than 130/80.  Follow these instructions at home: Eating and drinking  If directed, follow the DASH eating plan. This diet includes: ? Filling half of your plate at each meal with fruits and vegetables. ? Filling one quarter of your plate at each meal with whole grains. Whole grains include whole wheat pasta, brown rice, and whole grain bread. ? Eating or drinking low-fat dairy products, such as skim milk or low-fat yogurt. ? Filling one quarter of your plate at each meal with low-fat (lean) proteins. Low-fat proteins include fish, skinless chicken, eggs, beans, and tofu. ? Avoiding fatty meat, cured and processed meat, or chicken with skin. ? Avoiding premade or processed food.  Eat less than 1,500 mg of salt (sodium) a day.  Limit alcohol use to no more than 1 drink a day for nonpregnant women and 2 drinks a day for men. One drink equals 12 oz of beer, 5 oz of wine, or 1 oz of hard liquor. Lifestyle  Work with your doctor to stay at a healthy weight or to lose weight. Ask your doctor  what the best weight is for you.  Get at least 30 minutes of exercise that causes your heart to beat faster (aerobic exercise) most days of the week. This may include walking, swimming, or biking.  Get at least 30 minutes of exercise that strengthens your muscles (resistance exercise) at least 3 days a week. This may include lifting weights or pilates.  Do not use any products that contain nicotine or tobacco. This includes cigarettes and e-cigarettes. If you need help quitting, ask your doctor.  Check your blood pressure at home as told by your doctor.  Keep all follow-up visits as told by your doctor. This is important. Medicines  Take over-the-counter and prescription medicines only as told by your doctor. Follow directions carefully.  Do not skip doses of blood pressure medicine. The medicine does not work as well if you skip doses. Skipping doses also puts you at risk for problems.  Ask your doctor about side effects or reactions to medicines that you should watch for. Contact a doctor if:  You think you are having a reaction to the medicine you are taking.  You have headaches that keep coming back (recurring).  You feel dizzy.  You have swelling in your ankles.  You have trouble with your vision. Get help right away if:  You get a very bad headache.  You start to feel confused.  You feel weak or numb.  You feel faint.  You get very bad pain in your: ? Chest. ? Belly (abdomen).  You throw up (vomit) more than once.  You have trouble breathing. Summary  Hypertension is another name  for high blood pressure.  Making healthy choices can help lower blood pressure. If your blood pressure cannot be controlled with healthy choices, you may need to take medicine. This information is not intended to replace advice given to you by your health care provider. Make sure you discuss any questions you have with your health care provider. Document Released: 05/13/2008 Document  Revised: 10/23/2016 Document Reviewed: 10/23/2016 Elsevier Interactive Patient Education  2018 [employer]      Agustina Caroli, MD [facility]

## 2017-06-07 ENCOUNTER — Telehealth: Payer: Self-pay | Admitting: Emergency Medicine

## 2017-06-07 NOTE — Telephone Encounter (Signed)
Pt calling in regards to test strips she was prescribed on Friday. Pt said that she went to the pharmacy to get them and they told her they have been recalled. She was not sure what to do concerning this. I asked clinical and they said the pharmacy may be able to give her the proper ones. The pt is going to call her pharmacy to see what they can do and will give [place] a callback if they cannot change it. Pt contact number is [phone].

## 2017-06-10 ENCOUNTER — Telehealth: Payer: Self-pay | Admitting: Neurology

## 2017-06-10 DIAGNOSIS — F319 Bipolar disorder, unspecified: Secondary | ICD-10-CM | POA: Diagnosis not present

## 2017-06-10 DIAGNOSIS — F71 Moderate intellectual disabilities: Secondary | ICD-10-CM | POA: Diagnosis not present

## 2017-06-10 DIAGNOSIS — Z7984 Long term (current) use of oral hypoglycemic drugs: Secondary | ICD-10-CM | POA: Diagnosis not present

## 2017-06-10 DIAGNOSIS — I10 Essential (primary) hypertension: Secondary | ICD-10-CM | POA: Diagnosis not present

## 2017-06-10 DIAGNOSIS — I69354 Hemiplegia and hemiparesis following cerebral infarction affecting left non-dominant side: Secondary | ICD-10-CM | POA: Diagnosis not present

## 2017-06-10 DIAGNOSIS — E119 Type 2 diabetes mellitus without complications: Secondary | ICD-10-CM | POA: Diagnosis not present

## 2017-06-10 NOTE — Telephone Encounter (Signed)
We do not complete prior authorizations for cpap machines. [facility] should do this. I have reached out to [facility] to find out what is going on.

## 2017-06-10 NOTE — Telephone Encounter (Signed)
Please advise 

## 2017-06-10 NOTE — Telephone Encounter (Signed)
Pharmacy should be able to advise and replace as needed.

## 2017-06-10 NOTE — Telephone Encounter (Signed)
Olivia Mackie with [facility] is calling to advise patient cancelled nursing visit for today.

## 2017-06-10 NOTE — Telephone Encounter (Signed)
Patient called office in reference to [name] not covering orignal CPAP machine order due to it being discontinued.  Per patient she states we need to contact [employer] to start a new authorization for a new machine.  [employer] number [phone].  [facility] uses [facility].

## 2017-06-10 NOTE — Telephone Encounter (Signed)
Received this note from [facility]:  "I see that pt brought her unit (S9) in yesterday and it was assessed by our RT group; determine that the unit is un repairable and pt didn't want to private pay for a unit. I have reached out to them to determine what options the pt has, when she would be eligible for a replacement and what would be required etc. I'll let you know soon as I hear back from them."

## 2017-06-12 DIAGNOSIS — I10 Essential (primary) hypertension: Secondary | ICD-10-CM | POA: Diagnosis not present

## 2017-06-12 DIAGNOSIS — I69354 Hemiplegia and hemiparesis following cerebral infarction affecting left non-dominant side: Secondary | ICD-10-CM | POA: Diagnosis not present

## 2017-06-12 DIAGNOSIS — F71 Moderate intellectual disabilities: Secondary | ICD-10-CM | POA: Diagnosis not present

## 2017-06-12 DIAGNOSIS — E119 Type 2 diabetes mellitus without complications: Secondary | ICD-10-CM | POA: Diagnosis not present

## 2017-06-12 DIAGNOSIS — F319 Bipolar disorder, unspecified: Secondary | ICD-10-CM | POA: Diagnosis not present

## 2017-06-12 DIAGNOSIS — Z7984 Long term (current) use of oral hypoglycemic drugs: Secondary | ICD-10-CM | POA: Diagnosis not present

## 2017-06-12 NOTE — Telephone Encounter (Signed)
Received this note from [facility]: "So I have found that pt's unit is not repairable because she has some how broken the knob off the unit. Her current machine was provided in 04/2013 and insurance typically only covers a new unit every 5 years. She now has [employer] primary we can see if they will cover a replacement however pt will have to sign wavier because there is no guarantee they will pay as primary and her Medicaid now being secondary will not cover the copays.   In order to attempt to provide a replacement cpap we would need rx with pressure settings for replacement machine and OV notes within last 6 months showing pt's current usage and benefit of pap therapy.   However in reviewing her 6/11 OV note it states that due to her loosing Medicaid benefits she hasn't used her cpap in over 3 months.   The will still have a copay for a replacement unit as Medicaid has maxed out their coverage for the item.   Unfortunately for this pt since there was a 3 month break in service the pt would need to start the process over (new office visit, new sleep study and new order). "  I called pt. I explained this to her. She will need a new OV with Dr. Rexene Alberts, a new sleep study as well, and then an order for cpap can be generated. Pt is agreeable to an appt with Dr. Rexene Alberts on 06/17/17 at 1:00pm. Pt verbalized understanding of appt date and time.

## 2017-06-13 ENCOUNTER — Other Ambulatory Visit: Payer: Self-pay | Admitting: Adult Health

## 2017-06-13 DIAGNOSIS — I10 Essential (primary) hypertension: Secondary | ICD-10-CM | POA: Diagnosis not present

## 2017-06-13 DIAGNOSIS — F319 Bipolar disorder, unspecified: Secondary | ICD-10-CM | POA: Diagnosis not present

## 2017-06-13 DIAGNOSIS — F71 Moderate intellectual disabilities: Secondary | ICD-10-CM | POA: Diagnosis not present

## 2017-06-13 DIAGNOSIS — Z7984 Long term (current) use of oral hypoglycemic drugs: Secondary | ICD-10-CM | POA: Diagnosis not present

## 2017-06-13 DIAGNOSIS — E119 Type 2 diabetes mellitus without complications: Secondary | ICD-10-CM | POA: Diagnosis not present

## 2017-06-13 DIAGNOSIS — I69354 Hemiplegia and hemiparesis following cerebral infarction affecting left non-dominant side: Secondary | ICD-10-CM | POA: Diagnosis not present

## 2017-06-16 ENCOUNTER — Ambulatory Visit (INDEPENDENT_AMBULATORY_CARE_PROVIDER_SITE_OTHER): Payer: Self-pay | Admitting: *Deleted

## 2017-06-16 DIAGNOSIS — F319 Bipolar disorder, unspecified: Secondary | ICD-10-CM | POA: Diagnosis not present

## 2017-06-16 DIAGNOSIS — I69354 Hemiplegia and hemiparesis following cerebral infarction affecting left non-dominant side: Secondary | ICD-10-CM | POA: Diagnosis not present

## 2017-06-16 DIAGNOSIS — I10 Essential (primary) hypertension: Secondary | ICD-10-CM | POA: Diagnosis not present

## 2017-06-16 DIAGNOSIS — Z7984 Long term (current) use of oral hypoglycemic drugs: Secondary | ICD-10-CM | POA: Diagnosis not present

## 2017-06-16 DIAGNOSIS — F71 Moderate intellectual disabilities: Secondary | ICD-10-CM | POA: Diagnosis not present

## 2017-06-16 DIAGNOSIS — I639 Cerebral infarction, unspecified: Secondary | ICD-10-CM

## 2017-06-16 DIAGNOSIS — E119 Type 2 diabetes mellitus without complications: Secondary | ICD-10-CM | POA: Diagnosis not present

## 2017-06-17 ENCOUNTER — Ambulatory Visit (INDEPENDENT_AMBULATORY_CARE_PROVIDER_SITE_OTHER): Payer: Medicare HMO | Admitting: Neurology

## 2017-06-17 ENCOUNTER — Encounter: Payer: Self-pay | Admitting: Neurology

## 2017-06-17 VITALS — BP 137/93 | HR 77 | Ht 61.0 in | Wt 274.0 lb

## 2017-06-17 DIAGNOSIS — Z7984 Long term (current) use of oral hypoglycemic drugs: Secondary | ICD-10-CM | POA: Diagnosis not present

## 2017-06-17 DIAGNOSIS — R351 Nocturia: Secondary | ICD-10-CM | POA: Diagnosis not present

## 2017-06-17 DIAGNOSIS — F319 Bipolar disorder, unspecified: Secondary | ICD-10-CM | POA: Diagnosis not present

## 2017-06-17 DIAGNOSIS — G4719 Other hypersomnia: Secondary | ICD-10-CM

## 2017-06-17 DIAGNOSIS — F71 Moderate intellectual disabilities: Secondary | ICD-10-CM | POA: Diagnosis not present

## 2017-06-17 DIAGNOSIS — E119 Type 2 diabetes mellitus without complications: Secondary | ICD-10-CM | POA: Diagnosis not present

## 2017-06-17 DIAGNOSIS — Z8673 Personal history of transient ischemic attack (TIA), and cerebral infarction without residual deficits: Secondary | ICD-10-CM

## 2017-06-17 DIAGNOSIS — I69354 Hemiplegia and hemiparesis following cerebral infarction affecting left non-dominant side: Secondary | ICD-10-CM | POA: Diagnosis not present

## 2017-06-17 DIAGNOSIS — G4733 Obstructive sleep apnea (adult) (pediatric): Secondary | ICD-10-CM

## 2017-06-17 DIAGNOSIS — I10 Essential (primary) hypertension: Secondary | ICD-10-CM | POA: Diagnosis not present

## 2017-06-17 DIAGNOSIS — Z6841 Body Mass Index (BMI) 40.0 and over, adult: Secondary | ICD-10-CM

## 2017-06-17 LAB — CUP PACEART INCLINIC DEVICE CHECK
Date Time Interrogation Session: 20180709152416
MDC IDC PG IMPLANT DT: 20180622

## 2017-06-17 NOTE — Progress Notes (Signed)
Wound check in clinic s/p ILR implant. Wound well healed without redness or edema. Incision edges approximated. Normal ILR device function. Battery status: Good. R-waves 0.48mV. 0 symptom episodes, 0 tachy episodes, 0 pause episodes, 0 brady episodes. 0 AF episodes (0% burden). Monthly summary reports and ROV with WC PRN. Patient education completed including wound care and remote monitoring.

## 2017-06-17 NOTE — Progress Notes (Signed)
Subjective:    Patient ID: Brandi Gomez is a 46 y.o. female.  HPI     Interim history:   Brandi Gomez is a 46 year old right-handed woman with an underlying medical history of hypertension, diabetes, depression, morbid obesity, seizures, and anxiety who presents for reevaluation of her OSA. The patient is accompanied by her BF today. I last saw her on 12/19/2014, at which time she reported difficulty tolerating CPAP. She was not fully compliant with it. She lost coverage for her machine in the interim. She presents for reevaluation. She was originally referred by Dr. Jannifer Franklin.  Today, 06/17/2017 (all dictated new, as well as above notes, some dictation done in note pad or Word, outside of chart, may appear as copied):  She reports that her L sided weakness is getting better, in Brandi Gomez, including PT, OT and nursing. Of note, patient was hospitalized recently in June 2018 for angioedema secondary to new ACE inhibitor. She was encouraged to go back on CPAP therapy. Unfortunately, she had a lapse in her compliance for over 3 months and would have to go through the evaluation process again. She is willing to come back for another sleep study. She also was hospitalized earlier in June 2018 for new onset left UE weakness, was found to have an acute right peri-rolandic stroke. She last saw Dr. Jannifer Franklin on 05/19/2017 and I reviewed the note. She does report having mouth dryness and discomfort with the mask, tried a nose mask and FFM. She has a loop monitor placed, about 2 weeks ago. She had extensive w/u in the Gomez. Does not smoke or drink EtOH, regular soda about 1 l per day.  ESS is 15/24 today, does not drive.    The patient's allergies, current medications, family history, past medical history, past social history, past surgical history and problem list were reviewed and updated as appropriate.   Previously (copied from previous notes for reference):   I first met her on 08/13/2013 at the  request of Dr. Jannifer Franklin, at which time we talked about her recent sleep study results. She was previously diagnosed with obstructive sleep apnea but not compliant with CPAP treatment. She reported trying to adjust CPAP therapy.    I reviewed her compliance data from 09/21/2014 through 12/18/2014 which is a total of 89 days during which time she used her machine 43 days with percent used days greater than 4 hours at only 7%, indicating poor compliance, residual AHI at 1.7 per hour, leak acceptable at 19 L/m for the 95th percentile pressure of 10 cm with EPR of 2, average usage for all days of only 1 hour and 19 minutes, average usage for days on machine of 2 hours and 45 minutes.    She had a split-night sleep study on 03/31/2013. Sleep efficiency was at baseline reduced at 77.3% with a latency to sleep of 11.5 minutes and wake after sleep onset of 12 minutes. She had an arousal index of 21.3 arousals. She had a high normal percentage of stage I sleep, borderline increased percentage of stage II sleep, reduced percentage of deep sleep at 2.5% and markedly increased percentage of REM sleep at 31.9% with a mildly reduced REM latency of 65.5 minutes. She had no significant PLMs or cardiac arrhythmias. She had mild to moderate snoring and slept only in the right lateral position. She had a total of 34 obstructive apneas and 34 obstructive hypopneas, and her AHI was 52.3 per hour. Her baseline oxygen saturation was noted to be  only 86%, her nadir was 66% and REM sleep. She was then titrated on CPAP on a pressure of 5-10 cm of water utilizing a nasal mask. Her AHI was reduced to 1.4 events per hour at 10 cm of pressure. Supine REM sleep was achieved on the final pressure. Based on the test results I prescribed CPAP for her.    I reviewed her compliance data from 04/27/2013 through 05/26/2013 which is a total of 30 days, during which time she uses CPAP every day. Her percent used days greater than 4 hours was 97%  indicating excellent compliance. Her average usage was 6 hours and 10 minutes and her residual AHI was 1.8, indicating a good treatment pressure of 10 cm of water with an EPR level of 2.     I reviewed the most recent compliance data from her compliance to that she brought in from dates 05/15/2013 through 08/12/2013 which is a total of 90 days during which time she used it every day except for 6 days. Her percent used days greater than 4 hours was only 47% and her average usage for all days was 3 hours and 50 minutes only. Her residual AHI was 3.1 indicating again adequate treatment pressure of 10 cm with EPR of 2.    Her Past Medical History Is Significant For: Past Medical History:  Diagnosis Date  . Anemia   . Anxiety   . Bipolar 1 disorder ([place])   . Common migraine 05/19/2015  . Depression   . Hypertension   . Mild mental retardation   . Obesity   . Partial complex seizure disorder with intractable epilepsy ([place]) 05/12/2014  . Seizures ([place])    intractable  . Sleep apnea   . Stroke ([place])   . Type II or unspecified type diabetes mellitus without mention of complication, not stated as uncontrolled     Her Past Surgical History Is Significant For: Past Surgical History:  Procedure Laterality Date  . COLONOSCOPY     2012-normal , Dr Sharlett Iles  . ESOPHAGOGASTRODUODENOSCOPY     normal-Dr Patterson 2012  . LOOP RECORDER INSERTION N/A 05/30/2017   Procedure: Loop Recorder Insertion;  Surgeon: Constance Haw, MD;  Location: [facility];  Service: Cardiovascular;  Laterality: N/A;  . MYRINGOTOMY WITH TUBE PLACEMENT    . NASAL SINUS SURGERY    . TEE WITHOUT CARDIOVERSION N/A 05/30/2017   Procedure: TRANSESOPHAGEAL ECHOCARDIOGRAM (TEE);  Surgeon: Acie Fredrickson Wonda Cheng, MD;  Location: [facility] ENDOSCOPY;  Service: Cardiovascular;  Laterality: N/A;    Her Family History Is Significant For: Family History  Problem Relation Age of Onset  . Diabetes Mother        passed away from accidental  death  . Mental illness Father   . Diabetes Daughter   . Cancer Daughter        leukemia  . Cancer Maternal Aunt        ovarian ca    Her Social History Is Significant For: Social History   Social History  . Marital status: Single    Spouse name: N/A  . Number of children: 3  . Years of education: 12   Occupational History  . disabled   .  Disabled   Social History Main Topics  . Smoking status: Never Smoker  . Smokeless tobacco: Never Used  . Alcohol use No  . Drug use: No  . Sexual activity: Not Asked   Other Topics Concern  . None   Social History Narrative  Patient lives at home with daughter.    Patient has 3 children.    Patient is right handed.    Patient has a high school education.    Patient is on disability   Patient drinks 2 cups of caffeine daily.    Her Allergies Are:  Allergies  Allergen Reactions  . Amoxicillin Itching    Has patient had a PCN reaction causing immediate rash, facial/tongue/throat swelling, SOB or lightheadedness with hypotension: yes Has patient had a PCN reaction causing severe rash involving mucus membranes or skin necrosis: no Has patient had a PCN reaction that required hospitalization: no Has patient had a PCN reaction occurring within the last 10 years: yes If all of the above answers are "NO", then may proceed with Cephalosporin use.  Marland Kitchen Lisinopril Swelling    Angioedema   . Hydrocodone Other (See Comments)    Depressed   :   Her Current Medications Are:  Outpatient Encounter Prescriptions as of 06/17/2017  Medication Sig  . ACCU-CHEK SOFTCLIX LANCETS lancets Use as instructed  . aspirin 325 MG tablet Take 1 tablet (325 mg total) by mouth daily.  Marland Kitchen atorvastatin (LIPITOR) 10 MG tablet Take 1 tablet (10 mg total) by mouth daily at 6 PM.  . carvedilol (COREG) 3.125 MG tablet Take 1 tablet (3.125 mg total) by mouth 2 (two) times daily.  . diclofenac (VOLTAREN) 75 MG EC tablet Take 75 mg by mouth daily.   . famotidine  (PEPCID) 20 MG tablet TAKE 1 TABLET BY MOUTH TWICE A DAY  . glucose blood test strip Use as instructed  . levETIRAcetam (KEPPRA) 500 MG tablet TAKE 1 TABLET BY MOUTH TWICE A DAY  . phenytoin (DILANTIN) 100 MG ER capsule TAKE 2 CAPSULES IN THE MORNING AND 3 CAPSULES IN THE EVENING (Patient taking differently: Take 200 mg by mouth See admin instructions. TAKE 2 CAPSULES IN THE MORNING AND 2 CAPSULES IN THE EVENING)  . predniSONE (DELTASONE) 10 MG tablet Take 4 tabs PO daily x 2 days, then 3 tabs PO daily x 2 days, then 2 tabs PO daily x 2 days, then 1 tab PO daily x 2 days, then STOP  . sitaGLIPtin (JANUVIA) 50 MG tablet Take 1 tablet (50 mg total) by mouth daily. For control of blood sugar  . torsemide (DEMADEX) 20 MG tablet Take 1 tablet (20 mg total) by mouth daily.  . diphenhydrAMINE (BENADRYL) 25 mg capsule Take 1 capsule (25 mg total) by mouth 2 (two) times daily.   No facility-administered encounter medications on file as of 06/17/2017.   :  Review of Systems:  Out of a complete 14 point review of systems, all are reviewed and negative with the exception of these symptoms as listed below: Review of Systems  Neurological:       Pt presents today to discuss getting a new cpap. Pt's current cpap is broken and per her insurance requirements, pt must start over with new office visit and sleep study.    Objective:  Neurological Exam  Physical Exam Physical Examination:   Vitals:   06/17/17 1313  BP: (!) 137/93  Pulse: 77   General Examination: The patient is a very pleasant 46 y.o. female in no acute distress. She appears well-developed and well-nourished and well groomed.   HEENT: Normocephalic, atraumatic, pupils are equal, round and reactive to light and accommodation. Extraocular tracking is good without limitation to gaze excursion or nystagmus noted. Normal smooth pursuit is noted. Hearing is grossly intact. Face is  symmetric with normal facial animation and normal facial  sensation. Speech is clear but slight dysarthria noted. There is no hypophonia. There is no lip, neck/head, jaw or voice tremor. Neck is supple with full range of passive and active motion. There are no carotid bruits on auscultation. Oropharynx exam reveals: mild mouth dryness, adequate dental hygiene and moderate airway crowding, due to redundant soft palate and larger tongue and tonsils of 2+. Mallampati is class II. Tongue protrudes centrally and palate elevates symmetrically. Neck is 17 in.    Chest: Clear to auscultation without wheezing, rhonchi or crackles noted.  Heart: S1+S2+0, regular and normal without murmurs, rubs or gallops noted.   Abdomen: Soft, non-tender and non-distended with normal bowel sounds appreciated on auscultation.  Extremities: There is trace pitting edema in the distal lower extremities bilaterally in her ankles. Pedal pulses are intact.  Skin: Warm and dry without trophic changes noted. There are no varicose veins.  Musculoskeletal: exam reveals no obvious joint deformities, tenderness or joint swelling or erythema.   Neurologically:  Mental status: The patient is awake, alert and oriented in all 4 spheres. Her memory, attention, language and knowledge are mildly impaired; there is mild psychomotor slowness. There is no aphasia, agnosia, apraxia or anomia. Speech shows minimal dysarthria. Thought process is linear. Mood is normal, and affect is blunted.  Cranial nerves are as described above under HEENT exam. In addition, shoulder shrug is normal with equal shoulder height noted. Motor exam: Normal bulk, strength and tone is noted with very mild grip strength weakness L hand and L hip flexor weakness. There is no drift, tremor or rebound. Romberg shows swaying. Reflexes are 1-2+ throughout. Fine motor skills are intact with normal finger taps, normal hand movements, normal rapid alternating patting, normal foot taps and normal foot agility.  Cerebellar testing  shows no dysmetria or intention tremor on finger to nose testing. There is no truncal or gait ataxia. Heel to shin difficult with L. Sensory exam is intact to light touch, vibration, and temperature sense in the upper and lower extremities.  Gait, station and balance:  are unremarkable. No veering to one side is noted. No leaning to one side is noted. Posture is age-appropriate and stance is narrow based. No problems turning are noted. No limp.   Assessment and Plan:   In summary, Brandi Gomez is a very pleasant 46 year old female with a history of seizure d/o, Morbid obesity, prior diagnosis of OSA, recent stroke in June 2018 with minimal residual weakness and slight dysarthria, who presents for reevaluation of her obstructive sleep apnea. She was previously diagnosed with severe obstructive sleep apnea, had a split-night sleep study on 03/31/2013, baseline AHI of 52.3 per hour at the time, O2 nadir of 66%. She has had some fluctuation in her weight. Of note, she had a reaction to a recent ACE inhibitor trial and was hospitalized for this. She has an appointment with Dr. Jannifer Franklin next week. She is on a regular size aspirin and Lipitor. She is encouraged to pursue weight loss. She weighs about 10 pounds less compared to when she first had her sleep study. She is encouraged to eliminate soda. She drinks about a liter per day of regular cola. She is encouraged to proceed with a repeat sleep study. I ordered a split-night sleep study and hopefully we can reestablish CPAP therapy quickly. She is motivated to get back on treatment and is advised about the risks and ramifications of untreated severe OSA, particularly with regards  to cardiovascular disease. Especially in light of her recent stroke she is strongly encouraged to be fully compliant with treatment once she requalifies for her machine. She demonstrates understanding and agreement. I answered all their questions today and the patient and her  boyfriend were in agreement.  I spent 25 minutes in total face-to-face time with the patient, more than 50% of which was spent in counseling and coordination of care, reviewing test results, reviewing medication and discussing or reviewing the diagnosis of OSA, its prognosis and treatment options. Pertinent laboratory and imaging test results that were available during this visit with the patient were reviewed by me and considered in my medical decision making (see chart for details).

## 2017-06-17 NOTE — Patient Instructions (Signed)
Based on your symptoms and your exam I believe you still are at risk for obstructive sleep apnea or OSA, and I think we should proceed with a repeat sleep study to determine how severe it is. If you have more than mild OSA, I want you to consider treatment with CPAP. Please remember, the risks and ramifications of moderate to severe obstructive sleep apnea or OSA are: Cardiovascular disease, including congestive heart failure, stroke, difficult to control hypertension, arrhythmias, and even type 2 diabetes has been linked to untreated OSA. Sleep apnea causes disruption of sleep and sleep deprivation in most cases, which, in turn, can cause recurrent headaches, problems with memory, mood, concentration, focus, and vigilance. Most people with untreated sleep apnea report excessive daytime sleepiness, which can affect their ability to drive. Please do not drive if you feel sleepy.   I will likely see you back after your sleep study to go over the test results and where to go from there. We will call you after your sleep study to advise about the results and to set up an appointment at the time, as necessary.    Our sleep lab administrative assistant, Arrie Aran will meet with you or call you to schedule your sleep study. If you don't hear back from her by next week please feel free to call her at [phone]. This is her direct line and please leave a message with your phone number to call back if you get the voicemail box. She will call back as soon as possible.

## 2017-06-18 DIAGNOSIS — F71 Moderate intellectual disabilities: Secondary | ICD-10-CM | POA: Diagnosis not present

## 2017-06-18 DIAGNOSIS — I69354 Hemiplegia and hemiparesis following cerebral infarction affecting left non-dominant side: Secondary | ICD-10-CM | POA: Diagnosis not present

## 2017-06-18 DIAGNOSIS — F319 Bipolar disorder, unspecified: Secondary | ICD-10-CM | POA: Diagnosis not present

## 2017-06-18 DIAGNOSIS — Z7984 Long term (current) use of oral hypoglycemic drugs: Secondary | ICD-10-CM | POA: Diagnosis not present

## 2017-06-18 DIAGNOSIS — I10 Essential (primary) hypertension: Secondary | ICD-10-CM | POA: Diagnosis not present

## 2017-06-18 DIAGNOSIS — E119 Type 2 diabetes mellitus without complications: Secondary | ICD-10-CM | POA: Diagnosis not present

## 2017-06-19 DIAGNOSIS — I10 Essential (primary) hypertension: Secondary | ICD-10-CM | POA: Diagnosis not present

## 2017-06-19 DIAGNOSIS — F319 Bipolar disorder, unspecified: Secondary | ICD-10-CM | POA: Diagnosis not present

## 2017-06-19 DIAGNOSIS — I69354 Hemiplegia and hemiparesis following cerebral infarction affecting left non-dominant side: Secondary | ICD-10-CM | POA: Diagnosis not present

## 2017-06-19 DIAGNOSIS — Z7984 Long term (current) use of oral hypoglycemic drugs: Secondary | ICD-10-CM | POA: Diagnosis not present

## 2017-06-19 DIAGNOSIS — F71 Moderate intellectual disabilities: Secondary | ICD-10-CM | POA: Diagnosis not present

## 2017-06-19 DIAGNOSIS — E119 Type 2 diabetes mellitus without complications: Secondary | ICD-10-CM | POA: Diagnosis not present

## 2017-06-20 DIAGNOSIS — I10 Essential (primary) hypertension: Secondary | ICD-10-CM | POA: Diagnosis not present

## 2017-06-20 DIAGNOSIS — F71 Moderate intellectual disabilities: Secondary | ICD-10-CM | POA: Diagnosis not present

## 2017-06-20 DIAGNOSIS — Z7984 Long term (current) use of oral hypoglycemic drugs: Secondary | ICD-10-CM | POA: Diagnosis not present

## 2017-06-20 DIAGNOSIS — I69354 Hemiplegia and hemiparesis following cerebral infarction affecting left non-dominant side: Secondary | ICD-10-CM | POA: Diagnosis not present

## 2017-06-20 DIAGNOSIS — F319 Bipolar disorder, unspecified: Secondary | ICD-10-CM | POA: Diagnosis not present

## 2017-06-20 DIAGNOSIS — E119 Type 2 diabetes mellitus without complications: Secondary | ICD-10-CM | POA: Diagnosis not present

## 2017-06-23 ENCOUNTER — Encounter: Payer: Self-pay | Admitting: Gastroenterology

## 2017-06-23 ENCOUNTER — Telehealth: Payer: Self-pay | Admitting: Neurology

## 2017-06-23 ENCOUNTER — Ambulatory Visit (INDEPENDENT_AMBULATORY_CARE_PROVIDER_SITE_OTHER): Payer: Medicare HMO | Admitting: Gastroenterology

## 2017-06-23 VITALS — BP 118/76 | HR 100 | Ht 60.0 in | Wt 270.0 lb

## 2017-06-23 DIAGNOSIS — R1013 Epigastric pain: Secondary | ICD-10-CM

## 2017-06-23 DIAGNOSIS — I69398 Other sequelae of cerebral infarction: Principal | ICD-10-CM

## 2017-06-23 DIAGNOSIS — I69354 Hemiplegia and hemiparesis following cerebral infarction affecting left non-dominant side: Secondary | ICD-10-CM | POA: Diagnosis not present

## 2017-06-23 DIAGNOSIS — E119 Type 2 diabetes mellitus without complications: Secondary | ICD-10-CM | POA: Diagnosis not present

## 2017-06-23 DIAGNOSIS — Z7984 Long term (current) use of oral hypoglycemic drugs: Secondary | ICD-10-CM | POA: Diagnosis not present

## 2017-06-23 DIAGNOSIS — F71 Moderate intellectual disabilities: Secondary | ICD-10-CM | POA: Diagnosis not present

## 2017-06-23 DIAGNOSIS — K580 Irritable bowel syndrome with diarrhea: Secondary | ICD-10-CM | POA: Diagnosis not present

## 2017-06-23 DIAGNOSIS — G8929 Other chronic pain: Secondary | ICD-10-CM

## 2017-06-23 DIAGNOSIS — I10 Essential (primary) hypertension: Secondary | ICD-10-CM | POA: Diagnosis not present

## 2017-06-23 DIAGNOSIS — F319 Bipolar disorder, unspecified: Secondary | ICD-10-CM | POA: Diagnosis not present

## 2017-06-23 DIAGNOSIS — I69359 Hemiplegia and hemiparesis following cerebral infarction affecting unspecified side: Secondary | ICD-10-CM

## 2017-06-23 MED ORDER — DICYCLOMINE HCL 10 MG PO CAPS: 10.0000 mg | ORAL_CAPSULE | Freq: Three times a day (TID) | ORAL | 1 refills | Status: DC

## 2017-06-23 NOTE — Addendum Note (Signed)
Addended by: [name] on: 06/23/2017 01:34 PM   Modules accepted: Orders

## 2017-06-23 NOTE — Telephone Encounter (Signed)
Called Will back at [facility]. He requested signed order be faxed to [phone]. Faxed signed order, received confirmation.

## 2017-06-23 NOTE — Telephone Encounter (Signed)
I will write a prescription for the shower chair.

## 2017-06-23 NOTE — Patient Instructions (Addendum)
If you are age 48 or older, your body mass index should be between 23-30. Your Body mass index is 52.73 kg/m. If this is out of the aforementioned range listed, please consider follow up with your Primary Care Provider.  If you are age 23 or younger, your body mass index should be between 19-25. Your Body mass index is 52.73 kg/m. If this is out of the aformentioned range listed, please consider follow up with your Primary Care Provider.      Food Guidelines for a sensitive stomach  Many people have difficulty digesting certain foods, causing a variety of distressing and embarrassing symptoms such as abdominal pain, bloating and gas.  These foods may need to be avoided or consumed in small amounts.  Here are some tips that might be helpful for you.  1.   Lactose intolerance is the difficulty or complete inability to digest lactose, the natural sugar in milk and anything made from milk.  This condition is harmless, common, and can begin any time during life.  Some people can digest a modest amount of lactose while others cannot tolerate any.  Also, not all dairy products contain equal amounts of lactose.  For example, hard cheeses such as parmesan have less lactose than soft cheeses such as cheddar.  Yogurt has less lactose than milk or cheese.  Many packaged foods (even many brands of bread) have milk, so read ingredient lists carefully.  It is difficult to test for lactose intolerance, so just try avoiding lactose as much as possible for a week and see what happens with your symptoms.  If you seem to be lactose intolerant, the best plan is to avoid it (but make sure you get calcium from another source).  The next best thing is to use lactase enzyme supplements, available over the counter everywhere.  Just know that many lactose intolerant people need to take several tablets with each serving of dairy to avoid symptoms.  Lastly, a lot of restaurant food is made with milk or butter.  Many are things you  might not suspect, such as mashed potatoes, rice and pasta (cooked with butter) and "grilled" items.  If you are lactose intolerant, it never hurts to ask your server what has milk or butter.  2.   Fiber is an important part of your diet, but not all fiber is well-tolerated.  Insoluble fiber such as bran is often consumed by normal gut bacteria and converted into gas.  Soluble fiber such as oats, squash, carrots and green beans are typically tolerated better.  3.   Some types of carbohydrates can be poorly digested.  Examples include: fructose (apples, cherries, pears, raisins and other dried fruits), fructans (onions, zucchini, large amounts of wheat), sorbitol/mannitol/xylitol and sucralose/Splenda (common artificial sweeteners), and raffinose (lentils, broccoli, cabbage, asparagus, brussel sprouts, many types of beans).  Do a Development worker, community for [employer] and you will find helpful information. Beano, a dietary supplement, will often help with raffinose-containing foods.  As with lactase tablets, you may need several per serving.  4.   Whenever possible, avoid processed food&meats and chemical additives.  High fructose corn syrup, a common sweetener, may be difficult to digest.  Eggs and soy (comes from the soybean, and added to many foods now) are the other most common bloating/gassy foods.  - Dr. Herma [facility]

## 2017-06-23 NOTE — Progress Notes (Signed)
[facility] Consult Note:  History: Brandi Gomez 06/23/2017  Referring physician: Horald Pollen, MD  Reason for consult/chief complaint: Abdominal Pain (epigastric/upper abd pain, even drinking water hurts stomach) and Diarrhea (soon after eating)   Subjective  HPI:  This is a 46 year old woman last seen by Dr. Sharlett Iles in March 2012 for complaints of abdominal pain and diarrhea. EGD with biopsies and colonoscopy with biopsies were all normal. She was felt likely to have symptoms consistent with IBS and was prescribed Levsin. She is a limited historian and cannot recall whether or not she took that medicine. She is here with same complaints now, primarily upper abdominal pain that might even occur just after drinking water. She also has the urgent need for a BM soon after meals with stools that are typically loose and always nonbloody. These sound like the same symptoms she had when evaluated by Dr. Sharlett Iles. She does in fact report the symptoms have been going on for many years. Her diabetes appears under reasonable control with a recent hemoglobin A1c of 7.4. She is also bothered by difficulty losing weight and feels like her upper abdomen is no longer flat like it once was.  ROS:  Review of Systems  Constitutional: Negative for appetite change and unexpected weight change.  HENT: Negative for mouth sores and voice change.   Eyes: Negative for pain and redness.  Respiratory: Negative for cough and shortness of breath.   Cardiovascular: Positive for leg swelling. Negative for chest pain and palpitations.  Genitourinary: Negative for dysuria and hematuria.  Musculoskeletal: Negative for arthralgias and myalgias.  Skin: Negative for pallor and rash.  Neurological: Negative for weakness and headaches.  Hematological: Negative for adenopathy.  Psychiatric/Behavioral: Positive for dysphoric mood.     Past Medical History: Past Medical History:    Diagnosis Date  . Anemia   . Anxiety   . Bipolar 1 disorder ([place])   . Common migraine 05/19/2015  . Depression   . Hypertension   . Mild mental retardation   . Obesity   . Partial complex seizure disorder with intractable epilepsy ([place]) 05/12/2014  . Seizures ([place])    intractable  . Sleep apnea   . Stroke ([place])   . Type II or unspecified type diabetes mellitus without mention of complication, not stated as uncontrolled    Recent angioedema from ACEI Recent slurred speech in ED - so cards placed loop recorder.  Past Surgical History: Past Surgical History:  Procedure Laterality Date  . COLONOSCOPY     2012-normal , Dr Sharlett Iles  . ESOPHAGOGASTRODUODENOSCOPY     normal-Dr Patterson 2012  . LOOP RECORDER INSERTION N/A 05/30/2017   Procedure: Loop Recorder Insertion;  Surgeon: Constance Haw, MD;  Location: [facility];  Service: Cardiovascular;  Laterality: N/A;  . MYRINGOTOMY WITH TUBE PLACEMENT    . NASAL SINUS SURGERY    . TEE WITHOUT CARDIOVERSION N/A 05/30/2017   Procedure: TRANSESOPHAGEAL ECHOCARDIOGRAM (TEE);  Surgeon: Acie Fredrickson Wonda Cheng, MD;  Location: [facility] ENDOSCOPY;  Service: Cardiovascular;  Laterality: N/A;     Family History: Family History  Problem Relation Age of Onset  . Diabetes Mother        passed away from accidental death  . Hypertension Mother   . Bipolar disorder Father   . Diabetes Daughter   . Leukemia Daughter   . Ovarian cancer Maternal Aunt     Social History: Social History   Social History  . Marital status: Single  Spouse name: N/A  . Number of children: 3  . Years of education: 12   Occupational History  . disabled   .  Disabled   Social History Main Topics  . Smoking status: Never Smoker  . Smokeless tobacco: Never Used  . Alcohol use No  . Drug use: No  . Sexual activity: Not Asked   Other Topics Concern  . None   Social History Narrative   Patient lives at home with daughter.    Patient has 3 children.     Patient is right handed.    Patient has a high school education.    Patient is on disability   Patient drinks 2 cups of caffeine daily.    Allergies: Allergies  Allergen Reactions  . Amoxicillin Itching    Has patient had a PCN reaction causing immediate rash, facial/tongue/throat swelling, SOB or lightheadedness with hypotension: yes Has patient had a PCN reaction causing severe rash involving mucus membranes or skin necrosis: no Has patient had a PCN reaction that required hospitalization: no Has patient had a PCN reaction occurring within the last 10 years: yes If all of the above answers are "NO", then may proceed with Cephalosporin use.  Marland Kitchen Lisinopril Swelling    Angioedema   . Hydrocodone Other (See Comments)    Depressed     Outpatient Meds: Current Outpatient Prescriptions  Medication Sig Dispense Refill  . ACCU-CHEK SOFTCLIX LANCETS lancets Use as instructed 100 each 12  . aspirin 325 MG tablet Take 1 tablet (325 mg total) by mouth daily. 30 tablet 1  . atorvastatin (LIPITOR) 10 MG tablet Take 1 tablet (10 mg total) by mouth daily at 6 PM. 30 tablet 1  . carvedilol (COREG) 3.125 MG tablet Take 1 tablet (3.125 mg total) by mouth 2 (two) times daily. 60 tablet 3  . diclofenac (VOLTAREN) 75 MG EC tablet Take 75 mg by mouth daily.   0  . famotidine (PEPCID) 20 MG tablet TAKE 1 TABLET BY MOUTH TWICE A DAY 15 tablet 0  . glucose blood test strip Use as instructed 100 each 12  . levETIRAcetam (KEPPRA) 500 MG tablet TAKE 1 TABLET BY MOUTH TWICE A DAY 60 tablet 3  . phenytoin (DILANTIN) 100 MG ER capsule TAKE 2 CAPSULES IN THE MORNING AND 3 CAPSULES IN THE EVENING (Patient taking differently: Take 200 mg by mouth See admin instructions. TAKE 2 CAPSULES IN THE MORNING AND 2 CAPSULES IN THE EVENING) 450 capsule 2  . sitaGLIPtin (JANUVIA) 50 MG tablet Take 1 tablet (50 mg total) by mouth daily. For control of blood sugar 90 tablet 1  . torsemide (DEMADEX) 20 MG tablet Take 1 tablet (20  mg total) by mouth daily. 90 tablet 1  . dicyclomine (BENTYL) 10 MG capsule Take 1 capsule (10 mg total) by mouth 3 (three) times daily before meals. 90 capsule 1   No current facility-administered medications for this visit.       ___________________________________________________________________ Objective   Exam:  BP 118/76 (BP Location: Left Wrist, Patient Position: Sitting, Cuff Size: Normal)   Pulse 100   Ht 5' (1.524 m) Comment: height measured without shoes  Wt 270 lb (122.5 kg)   LMP 06/13/2017   BMI 52.73 kg/m    General: this is a(n) Morbidly obese woman accompanied by her significant other   Eyes: sclera anicteric, no redness  ENT: oral mucosa moist without lesions, no cervical or supraclavicular lymphadenopathy, good dentition  CV: RRR without murmur, S1/S2, no JVD,  no peripheral edema  Resp: clear to auscultation bilaterally, normal RR and effort noted  GI: soft, no tenderness, with active bowel sounds. No guarding or palpable organomegaly noted.  Skin; warm and dry, no rash or jaundice noted. Multiple tattoos.  Neuro: awake, alert and oriented x 3. Normal gross motor function and fluent speech  Labs:  June Hgb A1C 7.4  No recent imaging studies  Assessment: Encounter Diagnoses  Name Primary?  . Irritable bowel syndrome with diarrhea Yes  . Abdominal pain, chronic, epigastric   . Controlled type 2 diabetes mellitus without complication, without long-term current use of insulin (HCC)     These appear to be ongoing IBS symptoms that have been stable for many years. Bacterial overgrowth seems much less likely. Previous biopsies were negative for sprue and microscopic colitis. As near as I can tell, it sounds like she makes questionable dietary choices that I think may be a large part of her symptoms.  Plan:  Written dietary advice given Trial of dicyclomine 10 mg 3 times daily before meals See me as needed  Thank you for the courtesy of this  consult.  Please call me with any questions or concerns.  Nelida Meuse III  CC: Horald Pollen, MD

## 2017-06-23 NOTE — Telephone Encounter (Signed)
Will(Occupational therapist ) calling to request shower chair  For pt. Needs a written prescription .  Will can be reached at [phone]

## 2017-06-24 ENCOUNTER — Other Ambulatory Visit: Payer: Self-pay | Admitting: Adult Health

## 2017-06-25 DIAGNOSIS — E119 Type 2 diabetes mellitus without complications: Secondary | ICD-10-CM | POA: Diagnosis not present

## 2017-06-25 DIAGNOSIS — I10 Essential (primary) hypertension: Secondary | ICD-10-CM | POA: Diagnosis not present

## 2017-06-25 DIAGNOSIS — Z7984 Long term (current) use of oral hypoglycemic drugs: Secondary | ICD-10-CM | POA: Diagnosis not present

## 2017-06-25 DIAGNOSIS — F319 Bipolar disorder, unspecified: Secondary | ICD-10-CM | POA: Diagnosis not present

## 2017-06-25 DIAGNOSIS — I69354 Hemiplegia and hemiparesis following cerebral infarction affecting left non-dominant side: Secondary | ICD-10-CM | POA: Diagnosis not present

## 2017-06-25 DIAGNOSIS — F71 Moderate intellectual disabilities: Secondary | ICD-10-CM | POA: Diagnosis not present

## 2017-06-26 ENCOUNTER — Encounter: Payer: Self-pay | Admitting: Neurology

## 2017-06-26 ENCOUNTER — Ambulatory Visit (INDEPENDENT_AMBULATORY_CARE_PROVIDER_SITE_OTHER): Payer: Medicare HMO | Admitting: Neurology

## 2017-06-26 VITALS — BP 129/90 | HR 80 | Ht 64.0 in | Wt 274.0 lb

## 2017-06-26 DIAGNOSIS — Z7984 Long term (current) use of oral hypoglycemic drugs: Secondary | ICD-10-CM | POA: Diagnosis not present

## 2017-06-26 DIAGNOSIS — R569 Unspecified convulsions: Secondary | ICD-10-CM | POA: Diagnosis not present

## 2017-06-26 DIAGNOSIS — F71 Moderate intellectual disabilities: Secondary | ICD-10-CM | POA: Diagnosis not present

## 2017-06-26 DIAGNOSIS — I639 Cerebral infarction, unspecified: Secondary | ICD-10-CM

## 2017-06-26 DIAGNOSIS — I10 Essential (primary) hypertension: Secondary | ICD-10-CM | POA: Diagnosis not present

## 2017-06-26 DIAGNOSIS — I69354 Hemiplegia and hemiparesis following cerebral infarction affecting left non-dominant side: Secondary | ICD-10-CM | POA: Diagnosis not present

## 2017-06-26 DIAGNOSIS — E119 Type 2 diabetes mellitus without complications: Secondary | ICD-10-CM | POA: Diagnosis not present

## 2017-06-26 DIAGNOSIS — F319 Bipolar disorder, unspecified: Secondary | ICD-10-CM | POA: Diagnosis not present

## 2017-06-26 NOTE — Progress Notes (Signed)
Reason for visit: Seizures, stroke  Brandi Gomez is an 46 y.o. female  History of present illness:  Brandi Gomez is a 46 year old right-handed black female with a history of sleep apnea, seizures, and migraine headache. The patient was admitted to the hospital on 05/20/2017 with onset of left arm weakness. The patient was found to have a right brain stroke event that was cortical in nature, felt to be embolic. The patient has undergone a workup without delineation of the source of the stroke. The patient has had a carotid Doppler study, 2-D echocardiogram, transesophageal echocardiogram, MRA of the head, and she has had a loop recorder placed. The patient is on antiplatelet agents currently. She is getting occupational and speech therapy, she has some residual weakness and clumsiness of the left hand. She denies any visual complaints or problems with swallowing. She has not had any falls. She returns for an evaluation.  Past Medical History:  Diagnosis Date  . Anemia   . Anxiety   . Bipolar 1 disorder ([place])   . Common migraine 05/19/2015  . Depression   . Hypertension   . Mild mental retardation   . Obesity   . Partial complex seizure disorder with intractable epilepsy ([place]) 05/12/2014  . Seizures ([place])    intractable  . Sleep apnea   . Stroke ([place])   . Type II or unspecified type diabetes mellitus without mention of complication, not stated as uncontrolled     Past Surgical History:  Procedure Laterality Date  . COLONOSCOPY     2012-normal , Dr Sharlett Iles  . ESOPHAGOGASTRODUODENOSCOPY     normal-Dr Patterson 2012  . LOOP RECORDER INSERTION N/A 05/30/2017   Procedure: Loop Recorder Insertion;  Surgeon: Constance Haw, MD;  Location: [facility];  Service: Cardiovascular;  Laterality: N/A;  . MYRINGOTOMY WITH TUBE PLACEMENT    . NASAL SINUS SURGERY    . TEE WITHOUT CARDIOVERSION N/A 05/30/2017   Procedure: TRANSESOPHAGEAL ECHOCARDIOGRAM (TEE);  Surgeon: Acie Fredrickson  Brandi Cheng, MD;  [facility]: [facility] ENDOSCOPY;  Service: Cardiovascular;  Laterality: N/A;    Family History  Problem Relation Age of Onset  . Diabetes Mother        passed away from accidental death  . Hypertension Mother   . Bipolar disorder Father   . Diabetes Daughter   . Leukemia Daughter   . Ovarian cancer Maternal Aunt     Social history:  reports that she has never smoked. She has never used smokeless tobacco. She reports that she does not drink alcohol or use drugs.    Allergies  Allergen Reactions  . Amoxicillin Itching    Has patient had a PCN reaction causing immediate rash, facial/tongue/throat swelling, SOB or lightheadedness with hypotension: yes Has patient had a PCN reaction causing severe rash involving mucus membranes or skin necrosis: no Has patient had a PCN reaction that required hospitalization: no Has patient had a PCN reaction occurring within the last 10 years: yes If all of the above answers are "NO", then may proceed with Cephalosporin use.  Marland Kitchen Lisinopril Swelling    Angioedema   . Hydrocodone Other (See Comments)    Depressed     Medications:  Prior to Admission medications   Medication Sig Start Date End Date Taking? Authorizing Provider  ACCU-CHEK SOFTCLIX LANCETS lancets Use as instructed 06/06/17  Yes Sagardia, Ines Bloomer, MD  aspirin 325 MG tablet Take 1 tablet (325 mg total) by mouth daily. 05/23/17  Yes Barton Dubois, MD  atorvastatin (LIPITOR) 10 MG tablet Take 1 tablet (10 mg total) by mouth daily at 6 PM. 05/22/17  Yes Barton Dubois, MD  carvedilol (COREG) 3.125 MG tablet Take 1 tablet (3.125 mg total) by mouth 2 (two) times daily. 05/22/17 05/22/18 Yes Barton Dubois, MD  diclofenac (VOLTAREN) 75 MG EC tablet Take 75 mg by mouth daily.  05/08/17  Yes [provider]  dicyclomine (BENTYL) 10 MG capsule Take 1 capsule (10 mg total) by mouth 3 (three) times daily before meals. 06/23/17  Yes Danis, Kirke Corin, MD  famotidine (PEPCID) 20 MG  tablet TAKE 1 TABLET BY MOUTH TWICE A DAY 06/16/17  Yes Whiteheart, Cristal Ford, NP  glucose blood test strip Use as instructed 06/06/17  Yes Sagardia, Ines Bloomer, MD  levETIRAcetam (KEPPRA) 500 MG tablet TAKE 1 TABLET BY MOUTH TWICE A DAY 05/20/17  Yes [name], MD  phenytoin (DILANTIN) 100 MG ER capsule TAKE 2 CAPSULES IN THE MORNING AND 3 CAPSULES IN THE EVENING Patient taking differently: Take 200 mg by mouth See admin instructions. TAKE 2 CAPSULES IN THE MORNING AND 2 CAPSULES IN THE EVENING 04/21/17  Yes [name], MD  sitaGLIPtin (JANUVIA) 50 MG tablet Take 1 tablet (50 mg total) by mouth daily. For control of blood sugar 05/08/17  Yes Sagardia, Ines Bloomer, MD  torsemide (DEMADEX) 20 MG tablet Take 1 tablet (20 mg total) by mouth daily. 05/08/17  Yes Sagardia, Ines Bloomer, MD    ROS:  Out of a complete 14 system review of symptoms, the patient complains only of the following symptoms, and all other reviewed systems are negative.  Blurred vision Memory loss, speech difficulty Sleep apnea, snoring  Blood pressure 129/90, pulse 80, height 5\' 4"  (1.626 m), weight 274 lb (124.3 kg), last menstrual period 06/13/2017.  Physical Exam  General: The patient is alert and cooperative at the time of the examination. The patient is markedly obese.  Skin: No significant peripheral edema is noted.   Neurologic Exam  Mental status: The patient is alert and oriented x 3 at the time of the examination. The patient has apparent normal recent and remote memory, with an apparently normal attention span and concentration ability.   Cranial nerves: Facial symmetry is present. Speech is normal, no aphasia or dysarthria is noted. Extraocular movements are full. Visual fields are full.  Motor: The patient has good strength in all 4 extremities, with exception of some weakness with intrinsic muscles of the left hand. Rapid alternating movements of the left hand were slowed.  Sensory  examination: Soft touch sensation is symmetric on the face, arms, and legs.  Coordination: The patient has good finger-nose-finger and heel-to-shin bilaterally.  Gait and station: The patient has a normal gait. Tandem gait is slightly unsteady. Romberg is negative. No drift is seen.  Reflexes: Deep tendon reflexes are symmetric.   MRI brain 05/20/17:  IMPRESSION: 1. Small acute right perirolandic infarct. 2. Mild chronic small vessel ischemic disease, advanced for age.  * MRI scan images were reviewed online. I agree with the written report.    MRA head 05/21/17:  IMPRESSION: Negative MRA head.   TEE 05/30/17:  Study Conclusions  - Left ventricle: There was mild concentric hypertrophy. - Aortic valve: No evidence of vegetation. - Mitral valve: No evidence of vegetation. There was mild   regurgitation. - Left atrium: No evidence of thrombus in the atrial cavity or   appendage. No evidence of thrombus in the atrial cavity or  appendage. - Atrial septum: No defect or patent foramen ovale was identified.   2d Echo 05/22/17:  Study Conclusions  - Left ventricle: Inferior and septal hypokinesis Poor image   quality no definity used. The cavity size was mildly dilated.   Wall thickness was increased in a pattern of mild LVH. Systolic   function was mildly to moderately reduced. The estimated ejection   fraction was in the range of 40% to 45%. Left ventricular   diastolic function parameters were normal. - Left atrium: The atrium was mildly dilated. - Atrial septum: No defect or patent foramen ovale was identified.   Carotid doppler 05/21/17:  Carotid Dopplers: Findings consistent with 1- 39 percent stenosis involving the right internal carotid artery and the left internal carotid artery.    Assessment/Plan:  1. Recent right brain stroke  2. Sleep apnea  3. Morbid obesity  4. History of seizures, no recent recurrence  5. Migraine headache  The source of  the stroke is unclear. The patient has a loop recorder in place. The patient will continue to be followed in this regard, she will follow-up in this office in October 2018. She is to report any recurring seizure events. She will remain on aspirin, and continue the Keppra and Dilantin for the seizures.  Jill Alexanders MD 06/26/2017 4:06 PM  [facility] [address] [place], [place] [address]  Phone [phone] Fax [phone]

## 2017-06-27 ENCOUNTER — Ambulatory Visit: Payer: Medicare HMO | Admitting: Emergency Medicine

## 2017-06-30 ENCOUNTER — Ambulatory Visit (INDEPENDENT_AMBULATORY_CARE_PROVIDER_SITE_OTHER): Payer: Medicare HMO | Admitting: *Deleted

## 2017-06-30 ENCOUNTER — Ambulatory Visit: Payer: Medicare HMO | Admitting: Emergency Medicine

## 2017-06-30 DIAGNOSIS — I639 Cerebral infarction, unspecified: Secondary | ICD-10-CM | POA: Diagnosis not present

## 2017-07-01 ENCOUNTER — Telehealth: Payer: Self-pay | Admitting: *Deleted

## 2017-07-01 DIAGNOSIS — Z7984 Long term (current) use of oral hypoglycemic drugs: Secondary | ICD-10-CM | POA: Diagnosis not present

## 2017-07-01 DIAGNOSIS — I69354 Hemiplegia and hemiparesis following cerebral infarction affecting left non-dominant side: Secondary | ICD-10-CM | POA: Diagnosis not present

## 2017-07-01 DIAGNOSIS — I10 Essential (primary) hypertension: Secondary | ICD-10-CM | POA: Diagnosis not present

## 2017-07-01 DIAGNOSIS — E119 Type 2 diabetes mellitus without complications: Secondary | ICD-10-CM | POA: Diagnosis not present

## 2017-07-01 DIAGNOSIS — F319 Bipolar disorder, unspecified: Secondary | ICD-10-CM | POA: Diagnosis not present

## 2017-07-01 DIAGNOSIS — F71 Moderate intellectual disabilities: Secondary | ICD-10-CM | POA: Diagnosis not present

## 2017-07-01 NOTE — Progress Notes (Signed)
Carelink Summary Report / Loop Recorder 

## 2017-07-01 NOTE — Telephone Encounter (Signed)
Faxed signed orders (ST evaluate/treat- ST [id number], [id number]) back to [facility]. Fax: [phone]. Received confirmation.

## 2017-07-02 DIAGNOSIS — E119 Type 2 diabetes mellitus without complications: Secondary | ICD-10-CM | POA: Diagnosis not present

## 2017-07-02 DIAGNOSIS — I69354 Hemiplegia and hemiparesis following cerebral infarction affecting left non-dominant side: Secondary | ICD-10-CM | POA: Diagnosis not present

## 2017-07-02 DIAGNOSIS — I10 Essential (primary) hypertension: Secondary | ICD-10-CM | POA: Diagnosis not present

## 2017-07-02 DIAGNOSIS — Z7984 Long term (current) use of oral hypoglycemic drugs: Secondary | ICD-10-CM | POA: Diagnosis not present

## 2017-07-02 DIAGNOSIS — F71 Moderate intellectual disabilities: Secondary | ICD-10-CM | POA: Diagnosis not present

## 2017-07-02 DIAGNOSIS — F319 Bipolar disorder, unspecified: Secondary | ICD-10-CM | POA: Diagnosis not present

## 2017-07-03 ENCOUNTER — Telehealth: Payer: Self-pay | Admitting: Neurology

## 2017-07-03 ENCOUNTER — Telehealth: Payer: Self-pay | Admitting: *Deleted

## 2017-07-03 DIAGNOSIS — G4733 Obstructive sleep apnea (adult) (pediatric): Secondary | ICD-10-CM

## 2017-07-03 DIAGNOSIS — F71 Moderate intellectual disabilities: Secondary | ICD-10-CM | POA: Diagnosis not present

## 2017-07-03 DIAGNOSIS — I69354 Hemiplegia and hemiparesis following cerebral infarction affecting left non-dominant side: Secondary | ICD-10-CM | POA: Diagnosis not present

## 2017-07-03 DIAGNOSIS — I10 Essential (primary) hypertension: Secondary | ICD-10-CM | POA: Diagnosis not present

## 2017-07-03 DIAGNOSIS — F319 Bipolar disorder, unspecified: Secondary | ICD-10-CM | POA: Diagnosis not present

## 2017-07-03 DIAGNOSIS — Z7984 Long term (current) use of oral hypoglycemic drugs: Secondary | ICD-10-CM | POA: Diagnosis not present

## 2017-07-03 DIAGNOSIS — E119 Type 2 diabetes mellitus without complications: Secondary | ICD-10-CM | POA: Diagnosis not present

## 2017-07-03 NOTE — Telephone Encounter (Signed)
I called pt to discuss. No answer, left a message asking her to call me back. 

## 2017-07-03 NOTE — Telephone Encounter (Signed)
[employer] denied Split because of the recent sleep study (2014) suggest auto pap for 30 days.

## 2017-07-03 NOTE — Telephone Encounter (Signed)
If pt calls me back on Friday, please let her know that her insurance denied the in-lab sleep study and that I will call her on Monday to discuss Dr. Guadelupe Sabin recommendation of setting her up on an auto pap.

## 2017-07-03 NOTE — Telephone Encounter (Signed)
Faxed signed order back to [facility] for: "Additional skilled nursing visits for further education on disease management including DM, diet, and BP". Fax: [phone]. Received fax confirmation.

## 2017-07-03 NOTE — Telephone Encounter (Signed)
We will set patient up with autoPAP at home, as insurance denied in house titration study for OSA. Pls process order and notify patient and set up FU in 10 weeks with me or NP.     

## 2017-07-07 NOTE — Telephone Encounter (Signed)
I called pt. I advised pt that her insurance denied her in lab sleep study. Dr. Rexene Alberts recommends that pt start an auto pap at home. I reviewed PAP compliance expectations with the pt. Pt is agreeable to starting a auto-PAP. I advised pt that an order will be sent to a DME, [facility], and [facility] will call the pt within about one week after they file with the pt's insurance. [facility] will show the pt how to use the machine, fit for masks, and troubleshoot the auto-PAP if needed. A follow up appt was made for insurance purposes with Dr. Rexene Alberts on 09/16/17 at 8:30am. Pt verbalized understanding to arrive 15 minutes early and bring their auto-PAP. A letter with all of this information in it will be mailed to the pt as a reminder. I verified with the pt that the address we have on file is correct. Pt verbalized understanding of results. Pt had no questions at this time but was encouraged to call back if questions arise.

## 2017-07-08 DIAGNOSIS — E119 Type 2 diabetes mellitus without complications: Secondary | ICD-10-CM | POA: Diagnosis not present

## 2017-07-08 DIAGNOSIS — F71 Moderate intellectual disabilities: Secondary | ICD-10-CM | POA: Diagnosis not present

## 2017-07-08 DIAGNOSIS — I10 Essential (primary) hypertension: Secondary | ICD-10-CM | POA: Diagnosis not present

## 2017-07-08 DIAGNOSIS — Z7984 Long term (current) use of oral hypoglycemic drugs: Secondary | ICD-10-CM | POA: Diagnosis not present

## 2017-07-08 DIAGNOSIS — I69354 Hemiplegia and hemiparesis following cerebral infarction affecting left non-dominant side: Secondary | ICD-10-CM | POA: Diagnosis not present

## 2017-07-08 DIAGNOSIS — F319 Bipolar disorder, unspecified: Secondary | ICD-10-CM | POA: Diagnosis not present

## 2017-07-09 ENCOUNTER — Ambulatory Visit: Payer: Commercial Managed Care - HMO | Admitting: Adult Health

## 2017-07-09 DIAGNOSIS — R41841 Cognitive communication deficit: Secondary | ICD-10-CM | POA: Diagnosis not present

## 2017-07-09 DIAGNOSIS — I10 Essential (primary) hypertension: Secondary | ICD-10-CM | POA: Diagnosis not present

## 2017-07-09 DIAGNOSIS — F319 Bipolar disorder, unspecified: Secondary | ICD-10-CM | POA: Diagnosis not present

## 2017-07-09 DIAGNOSIS — I69954 Hemiplegia and hemiparesis following unspecified cerebrovascular disease affecting left non-dominant side: Secondary | ICD-10-CM | POA: Diagnosis not present

## 2017-07-09 DIAGNOSIS — I69322 Dysarthria following cerebral infarction: Secondary | ICD-10-CM | POA: Diagnosis not present

## 2017-07-09 DIAGNOSIS — E119 Type 2 diabetes mellitus without complications: Secondary | ICD-10-CM | POA: Diagnosis not present

## 2017-07-09 DIAGNOSIS — Z7984 Long term (current) use of oral hypoglycemic drugs: Secondary | ICD-10-CM | POA: Diagnosis not present

## 2017-07-09 DIAGNOSIS — F71 Moderate intellectual disabilities: Secondary | ICD-10-CM | POA: Diagnosis not present

## 2017-07-09 DIAGNOSIS — I69354 Hemiplegia and hemiparesis following cerebral infarction affecting left non-dominant side: Secondary | ICD-10-CM | POA: Diagnosis not present

## 2017-07-13 LAB — CUP PACEART REMOTE DEVICE CHECK
Implantable Pulse Generator Implant Date: 20180622
MDC IDC SESS DTM: 20180722133600

## 2017-07-13 NOTE — Progress Notes (Signed)
Carelink summary report received. Battery status OK. Normal device function. No new symptom episodes, tachy episodes, brady, or pause episodes. No new AF episodes. Monthly summary reports and ROV/PRN 

## 2017-07-16 ENCOUNTER — Telehealth: Payer: Self-pay

## 2017-07-16 NOTE — Telephone Encounter (Signed)
Pt called the phone room with questions about her auto-pap, I was notified, and I took the call.  Pt says that she received a letter in the mail from [facility] telling her that her sleep study is approved, even though our sleep lab told her that it was not approved. Pt says that she called [employer] and they told her that she could have a sleep study. Pt wants a call back from the sleep lab to discuss this.  Pt also says that she called [facility] today and they are waiting on authorization for her new auto pap from her insurance. I explained to her several times that this is normal, that [facility] has to submit an auth for her new machine. Pt is frustrated in the delay. I explained that this is normal, nevertheless, I will reach out to [facility] and ask them to to discuss their auth process and what to expect.

## 2017-07-17 ENCOUNTER — Ambulatory Visit: Payer: Commercial Managed Care - HMO | Admitting: Adult Health

## 2017-07-17 NOTE — Telephone Encounter (Signed)
Received this notice from [facility]:  "Brandi Gomez,   So here is where we stand with this pt's pap. Barbaraann Rondo has spoken to the pt and explained all this as well. She is supposed to call us back if she decides to go forward.   Unfortunately [employer] will not cover a new pap (auto or regular for her).  Insurance will only cover pap unit replacements once every 5 years. Pt received her current unit in 2014  Her current pap unit has a button that is broken. In order for [facility] to consider a replacement pt must send her current unit through repair process.   We discussed this process with the pt and she states that she cannot afford at this time. It would be $50 cost to send unit off to manufacturer for repair assessment and then the repairs should be billable to insurance.   Pt said that she will call us back if she decides to go this route. "

## 2017-07-18 DIAGNOSIS — E119 Type 2 diabetes mellitus without complications: Secondary | ICD-10-CM | POA: Diagnosis not present

## 2017-07-18 DIAGNOSIS — F71 Moderate intellectual disabilities: Secondary | ICD-10-CM | POA: Diagnosis not present

## 2017-07-18 DIAGNOSIS — R41841 Cognitive communication deficit: Secondary | ICD-10-CM | POA: Diagnosis not present

## 2017-07-18 DIAGNOSIS — I69322 Dysarthria following cerebral infarction: Secondary | ICD-10-CM | POA: Diagnosis not present

## 2017-07-18 DIAGNOSIS — I10 Essential (primary) hypertension: Secondary | ICD-10-CM | POA: Diagnosis not present

## 2017-07-18 DIAGNOSIS — I69954 Hemiplegia and hemiparesis following unspecified cerebrovascular disease affecting left non-dominant side: Secondary | ICD-10-CM | POA: Diagnosis not present

## 2017-07-18 DIAGNOSIS — I69354 Hemiplegia and hemiparesis following cerebral infarction affecting left non-dominant side: Secondary | ICD-10-CM | POA: Diagnosis not present

## 2017-07-18 DIAGNOSIS — F319 Bipolar disorder, unspecified: Secondary | ICD-10-CM | POA: Diagnosis not present

## 2017-07-18 DIAGNOSIS — Z7984 Long term (current) use of oral hypoglycemic drugs: Secondary | ICD-10-CM | POA: Diagnosis not present

## 2017-07-18 NOTE — Telephone Encounter (Signed)
Spoke with patient and her machine is broken and with Medicare it will be a $50 charge to have it fixed or replaced. Patient cannot afford charge. She needs cpap machine due to a previous stroke a few months ago. I cleaned up a donated machine and set pressure at 10 from her 2014 sleep study. She came by office and I instructed her on use of machine and how to clean it. I gave her tubing and a F&P simplus mask. She said she is a mouth breather and the nasal pillows did not work well. I gave her my card to call me with any problems with machine. She was very grateful.

## 2017-07-18 NOTE — Telephone Encounter (Signed)
SUPER! Thank you so much Shirlean Mylar and Cyril Mourning!

## 2017-07-22 DIAGNOSIS — Z7984 Long term (current) use of oral hypoglycemic drugs: Secondary | ICD-10-CM | POA: Diagnosis not present

## 2017-07-22 DIAGNOSIS — I10 Essential (primary) hypertension: Secondary | ICD-10-CM | POA: Diagnosis not present

## 2017-07-22 DIAGNOSIS — F319 Bipolar disorder, unspecified: Secondary | ICD-10-CM | POA: Diagnosis not present

## 2017-07-22 DIAGNOSIS — I69954 Hemiplegia and hemiparesis following unspecified cerebrovascular disease affecting left non-dominant side: Secondary | ICD-10-CM | POA: Diagnosis not present

## 2017-07-22 DIAGNOSIS — I69354 Hemiplegia and hemiparesis following cerebral infarction affecting left non-dominant side: Secondary | ICD-10-CM | POA: Diagnosis not present

## 2017-07-22 DIAGNOSIS — F71 Moderate intellectual disabilities: Secondary | ICD-10-CM | POA: Diagnosis not present

## 2017-07-22 DIAGNOSIS — E119 Type 2 diabetes mellitus without complications: Secondary | ICD-10-CM | POA: Diagnosis not present

## 2017-07-22 DIAGNOSIS — R41841 Cognitive communication deficit: Secondary | ICD-10-CM | POA: Diagnosis not present

## 2017-07-22 DIAGNOSIS — I69322 Dysarthria following cerebral infarction: Secondary | ICD-10-CM | POA: Diagnosis not present

## 2017-07-29 ENCOUNTER — Ambulatory Visit (INDEPENDENT_AMBULATORY_CARE_PROVIDER_SITE_OTHER): Payer: Medicare HMO | Admitting: *Deleted

## 2017-07-29 DIAGNOSIS — I639 Cerebral infarction, unspecified: Secondary | ICD-10-CM | POA: Diagnosis not present

## 2017-07-30 DIAGNOSIS — R41841 Cognitive communication deficit: Secondary | ICD-10-CM | POA: Diagnosis not present

## 2017-07-30 DIAGNOSIS — I69354 Hemiplegia and hemiparesis following cerebral infarction affecting left non-dominant side: Secondary | ICD-10-CM | POA: Diagnosis not present

## 2017-07-30 DIAGNOSIS — I69954 Hemiplegia and hemiparesis following unspecified cerebrovascular disease affecting left non-dominant side: Secondary | ICD-10-CM | POA: Diagnosis not present

## 2017-07-30 DIAGNOSIS — I69322 Dysarthria following cerebral infarction: Secondary | ICD-10-CM | POA: Diagnosis not present

## 2017-07-30 DIAGNOSIS — F319 Bipolar disorder, unspecified: Secondary | ICD-10-CM | POA: Diagnosis not present

## 2017-07-30 DIAGNOSIS — E119 Type 2 diabetes mellitus without complications: Secondary | ICD-10-CM | POA: Diagnosis not present

## 2017-07-30 DIAGNOSIS — Z7984 Long term (current) use of oral hypoglycemic drugs: Secondary | ICD-10-CM | POA: Diagnosis not present

## 2017-07-30 DIAGNOSIS — I10 Essential (primary) hypertension: Secondary | ICD-10-CM | POA: Diagnosis not present

## 2017-07-30 DIAGNOSIS — F71 Moderate intellectual disabilities: Secondary | ICD-10-CM | POA: Diagnosis not present

## 2017-07-30 NOTE — Progress Notes (Signed)
Carelink Summary Report / Loop Recorder 

## 2017-08-03 LAB — CUP PACEART REMOTE DEVICE CHECK
Implantable Pulse Generator Implant Date: 20180622
MDC IDC SESS DTM: 20180821141210

## 2017-08-05 DIAGNOSIS — I69354 Hemiplegia and hemiparesis following cerebral infarction affecting left non-dominant side: Secondary | ICD-10-CM | POA: Diagnosis not present

## 2017-08-05 DIAGNOSIS — F319 Bipolar disorder, unspecified: Secondary | ICD-10-CM | POA: Diagnosis not present

## 2017-08-05 DIAGNOSIS — I69322 Dysarthria following cerebral infarction: Secondary | ICD-10-CM | POA: Diagnosis not present

## 2017-08-05 DIAGNOSIS — I10 Essential (primary) hypertension: Secondary | ICD-10-CM | POA: Diagnosis not present

## 2017-08-05 DIAGNOSIS — E119 Type 2 diabetes mellitus without complications: Secondary | ICD-10-CM | POA: Diagnosis not present

## 2017-08-05 DIAGNOSIS — Z7984 Long term (current) use of oral hypoglycemic drugs: Secondary | ICD-10-CM | POA: Diagnosis not present

## 2017-08-05 DIAGNOSIS — F71 Moderate intellectual disabilities: Secondary | ICD-10-CM | POA: Diagnosis not present

## 2017-08-05 DIAGNOSIS — I69954 Hemiplegia and hemiparesis following unspecified cerebrovascular disease affecting left non-dominant side: Secondary | ICD-10-CM | POA: Diagnosis not present

## 2017-08-05 DIAGNOSIS — R41841 Cognitive communication deficit: Secondary | ICD-10-CM | POA: Diagnosis not present

## 2017-08-06 ENCOUNTER — Ambulatory Visit (INDEPENDENT_AMBULATORY_CARE_PROVIDER_SITE_OTHER): Payer: Medicare HMO | Admitting: Emergency Medicine

## 2017-08-06 ENCOUNTER — Encounter: Payer: Self-pay | Admitting: Emergency Medicine

## 2017-08-06 VITALS — BP 107/71 | HR 69 | Temp 98.9°F | Resp 16 | Ht 60.25 in | Wt 270.4 lb

## 2017-08-06 DIAGNOSIS — I10 Essential (primary) hypertension: Secondary | ICD-10-CM | POA: Diagnosis not present

## 2017-08-06 DIAGNOSIS — E119 Type 2 diabetes mellitus without complications: Secondary | ICD-10-CM | POA: Diagnosis not present

## 2017-08-06 DIAGNOSIS — F411 Generalized anxiety disorder: Secondary | ICD-10-CM | POA: Diagnosis not present

## 2017-08-06 DIAGNOSIS — Z8673 Personal history of transient ischemic attack (TIA), and cerebral infarction without residual deficits: Secondary | ICD-10-CM | POA: Diagnosis not present

## 2017-08-06 MED ORDER — ATORVASTATIN CALCIUM 10 MG PO TABS: 10.0000 mg | ORAL_TABLET | Freq: Every day | ORAL | 3 refills | Status: DC

## 2017-08-06 MED ORDER — DULOXETINE HCL 30 MG PO CPEP: 30.0000 mg | ORAL_CAPSULE | Freq: Every day | ORAL | 3 refills | Status: DC

## 2017-08-06 NOTE — Progress Notes (Signed)
Brandi Gomez 46 y.o.   Chief Complaint  Patient presents with  . FRUSTRATION    x 2 weeks-per patient needs something for her nerves   . Medication Refill    lipitor    HISTORY OF PRESENT ILLNESS: This is a 46 y.o. female complaining of feeling anxious and stressed out; asking for something for her nerves; sometime in the past she was started on Prozac and it helped.  anxiety   Anxiety  Presents for initial visit. Onset was 1 to 4 weeks ago. The problem has been gradually worsening. Symptoms include confusion, decreased concentration, excessive worry and nervous/anxious behavior. Patient reports no chest pain, dizziness, insomnia, nausea, palpitations, panic, restlessness, shortness of breath or suicidal ideas. Symptoms occur constantly. The severity of symptoms is moderate and interfering with daily activities. The quality of sleep is fair.   Past treatments include nothing.     Prior to Admission medications   Medication Sig Start Date End Date Taking? Authorizing Provider  atorvastatin (LIPITOR) 10 MG tablet Take 1 tablet (10 mg total) by mouth daily at 6 PM. 05/22/17  Yes Barton Dubois, MD  carvedilol (COREG) 3.125 MG tablet Take 1 tablet (3.125 mg total) by mouth 2 (two) times daily. 05/22/17 05/22/18 Yes Barton Dubois, MD  diclofenac (VOLTAREN) 75 MG EC tablet Take 75 mg by mouth daily.  05/08/17  Yes [provider]  dicyclomine (BENTYL) 10 MG capsule Take 1 capsule (10 mg total) by mouth 3 (three) times daily before meals. 06/23/17  Yes Danis, Kirke Corin, MD  famotidine (PEPCID) 20 MG tablet TAKE 1 TABLET BY MOUTH TWICE A DAY 06/16/17  Yes Whiteheart, Cristal Ford, NP  levETIRAcetam (KEPPRA) 500 MG tablet TAKE 1 TABLET BY MOUTH TWICE A DAY 05/20/17  Yes [name], MD  phenytoin (DILANTIN) 100 MG ER capsule TAKE 2 CAPSULES IN THE MORNING AND 3 CAPSULES IN THE EVENING Patient taking differently: Take 200 mg by mouth See admin instructions. TAKE 2 CAPSULES IN THE  MORNING AND 2 CAPSULES IN THE EVENING 04/21/17  Yes [name], MD  sitaGLIPtin (JANUVIA) 50 MG tablet Take 1 tablet (50 mg total) by mouth daily. For control of blood sugar 05/08/17  Yes Chauna Osoria, Ines Bloomer, MD  torsemide (DEMADEX) 20 MG tablet Take 1 tablet (20 mg total) by mouth daily. 05/08/17  Yes Verginia Toohey, Ines Bloomer, MD  ACCU-CHEK [facility] LANCETS lancets Use as instructed 06/06/17   Horald Pollen, MD  aspirin 325 MG tablet Take 1 tablet (325 mg total) by mouth daily. Patient not taking: Reported on 08/06/2017 05/23/17   Barton Dubois, MD  glucose blood test strip Use as instructed 06/06/17   Horald Pollen, MD    Allergies  Allergen Reactions  . Amoxicillin Itching    Has patient had a PCN reaction causing immediate rash, facial/tongue/throat swelling, SOB or lightheadedness with hypotension: yes Has patient had a PCN reaction causing severe rash involving mucus membranes or skin necrosis: no Has patient had a PCN reaction that required hospitalization: no Has patient had a PCN reaction occurring within the last 10 years: yes If all of the above answers are "NO", then may proceed with Cephalosporin use.  Marland Kitchen Lisinopril Swelling    Angioedema   . Hydrocodone Other (See Comments)    Depressed     Patient Active Problem List   Diagnosis Date Noted  . Angioedema 06/03/2017  . OSA (obstructive sleep apnea)   . Cerebrovascular accident (CVA) ([place]) 05/20/2017  . Controlled  type 2 diabetes mellitus without complication, without long-term current use of insulin ([place]) 05/08/2017  . Essential hypertension 07/27/2008  . Convulsions ([place]) 02/05/2007    Past Medical History:  Diagnosis Date  . Anemia   . Anxiety   . Bipolar 1 disorder ([place])   . Common migraine 05/19/2015  . Depression   . Hypertension   . Mild mental retardation   . Obesity   . Partial complex seizure disorder with intractable epilepsy ([place]) 05/12/2014  . Seizures ([place])    intractable  . Sleep  apnea   . Stroke ([place])   . Type II or unspecified type diabetes mellitus without mention of complication, not stated as uncontrolled     Past Surgical History:  Procedure Laterality Date  . COLONOSCOPY     2012-normal , Dr Sharlett Iles  . ESOPHAGOGASTRODUODENOSCOPY     normal-Dr Patterson 2012  . LOOP RECORDER INSERTION N/A 05/30/2017   Procedure: Loop Recorder Insertion;  Surgeon: Constance Haw, MD;  Location: [facility];  Service: Cardiovascular;  Laterality: N/A;  . MYRINGOTOMY WITH TUBE PLACEMENT    . NASAL SINUS SURGERY    . TEE WITHOUT CARDIOVERSION N/A 05/30/2017   Procedure: TRANSESOPHAGEAL ECHOCARDIOGRAM (TEE);  Surgeon: Acie Fredrickson Wonda Cheng, MD;  Location: [facility] ENDOSCOPY;  Service: Cardiovascular;  Laterality: N/A;    Social History   Social History  . Marital status: Single    Spouse name: N/A  . Number of children: 3  . Years of education: 12   Occupational History  . disabled   .  Disabled   Social History Main Topics  . Smoking status: Never Smoker  . Smokeless tobacco: Never Used  . Alcohol use No  . Drug use: No  . Sexual activity: Not on file   Other Topics Concern  . Not on file   Social History Narrative   Patient lives at home with daughter.    Patient has 3 children.    Patient is right handed.    Patient has a high school education.    Patient is on disability   Patient drinks 2 cups of caffeine daily.    Family History  Problem Relation Age of Onset  . Diabetes Mother        passed away from accidental death  . Hypertension Mother   . Bipolar disorder Father   . Diabetes Daughter   . Leukemia Daughter   . Ovarian cancer Maternal Aunt      Review of Systems  Constitutional: Negative.  Negative for chills and fever.  HENT: Negative.   Eyes: Negative.   Respiratory: Negative.  Negative for cough and shortness of breath.   Cardiovascular: Negative.  Negative for chest pain and palpitations.  Gastrointestinal: Negative.   Negative for abdominal pain, diarrhea, nausea and vomiting.  Genitourinary: Negative.  Negative for dysuria and hematuria.  Skin: Negative.  Negative for rash.  Neurological: Negative.  Negative for dizziness, focal weakness and headaches.  Endo/Heme/Allergies: Negative.   Psychiatric/Behavioral: Positive for confusion and decreased concentration. Negative for suicidal ideas. The patient is nervous/anxious. The patient does not have insomnia.   All other systems reviewed and are negative.  Vitals:   08/06/17 1119  BP: 107/71  Pulse: 69  Resp: 16  Temp: 98.9 F (37.2 C)  SpO2: 98%    Physical Exam  Constitutional: She is oriented to person, place, and time. She appears well-developed and well-nourished.  HENT:  Head: Normocephalic and atraumatic.  Mouth/Throat: Oropharynx is clear and moist.  Eyes: Pupils are equal, round, and reactive to light. Conjunctivae and EOM are normal.  Neck: Normal range of motion. Neck supple. No JVD present. No thyromegaly present.  Cardiovascular: Normal rate, regular rhythm and normal heart sounds.   Pulmonary/Chest: Effort normal and breath sounds normal.  Abdominal: Soft. Bowel sounds are normal.  Musculoskeletal: Normal range of motion.  Lymphadenopathy:    She has no cervical adenopathy.  Neurological: She is alert and oriented to person, place, and time. No sensory deficit. She exhibits normal muscle tone.  Skin: Skin is warm and dry. Capillary refill takes less than 2 seconds. No rash noted.  Psychiatric: She has a normal mood and affect. Her behavior is normal.  Vitals reviewed.    ASSESSMENT & PLAN: Laila was seen today for frustration and medication refill.  Diagnoses and all orders for this visit:  GAD (generalized anxiety disorder)  Essential hypertension  Controlled type 2 diabetes mellitus without complication, without long-term current use of insulin (HCC)  History of recent stroke  Other orders -     DULoxetine  (CYMBALTA) 30 MG capsule; Take 1 capsule (30 mg total) by mouth daily. -     Discontinue: atorvastatin (LIPITOR) 10 MG tablet; Take 1 tablet (10 mg total) by mouth daily at 6 PM. -     atorvastatin (LIPITOR) 10 MG tablet; Take 1 tablet (10 mg total) by mouth daily at 6 PM.    Patient Instructions   We recommend that you schedule a mammogram for breast cancer screening. Typically, you do not need a referral to do this. Please contact a local imaging center to schedule your mammogram.  [facility] - [phone]  *ask for the Radiology Department [facility] [place] [facility]) - [phone] or [phone]  [facility] - [phone] [place] [phone] [facility] - [phone]  *ask for the [facility] - [phone]  *ask for the Radiology Department [facility] - [phone]  *ask for the [place] - [phone]    IF you received an x-ray today, you will receive an invoice from [facility]. Please contact [facility] Radiology at [phone] with questions or concerns regarding your invoice.   IF you received labwork today, you will receive an invoice from [facility]. Please contact [facility] at [phone] with questions or concerns regarding your invoice.   Our billing staff will not be able to assist you with questions regarding bills from these companies.  You will be contacted with the lab results as soon as they are available. The fastest way to get your results is to activate your My Chart account. Instructions are located on the last page of this paperwork. If you have not heard from [place] regarding the results in 2 weeks, please contact this office.     Generalized Anxiety Disorder, Adult Generalized anxiety disorder (GAD) is a mental health disorder. People with this condition constantly worry about everyday events.  Unlike normal anxiety, worry related to GAD is not triggered by a specific event. These worries also do not fade or get better with time. GAD interferes with life functions, including relationships, work, and school. GAD can vary from mild to severe. People with severe GAD can have intense waves of anxiety with physical symptoms (panic attacks). What are the causes? The exact cause of GAD is not known. What increases the risk? This condition is more likely to develop in:  Women.  People  who have a family history of anxiety disorders.  People who are very shy.  People who experience very stressful life events, such as the death of a loved one.  People who have a very stressful family environment.  What are the signs or symptoms? People with GAD often worry excessively about many things in their lives, such as their health and family. They may also be overly concerned about:  Doing well at work.  Being on time.  Natural disasters.  Friendships.  Physical symptoms of GAD include:  Fatigue.  Muscle tension or having muscle twitches.  Trembling or feeling shaky.  Being easily startled.  Feeling like your heart is pounding or racing.  Feeling out of breath or like you cannot take a deep breath.  Having trouble falling asleep or staying asleep.  Sweating.  Nausea, diarrhea, or irritable bowel syndrome (IBS).  Headaches.  Trouble concentrating or remembering facts.  Restlessness.  Irritability.  How is this diagnosed? Your health care provider can diagnose GAD based on your symptoms and medical history. You will also have a physical exam. The health care provider will ask specific questions about your symptoms, including how severe they are, when they started, and if they come and go. Your health care provider may ask you about your use of alcohol or drugs, including prescription medicines. Your health care provider may refer you to a mental health specialist for  further evaluation. Your health care provider will do a thorough examination and may perform additional tests to rule out other possible causes of your symptoms. To be diagnosed with GAD, a person must have anxiety that:  Is out of his or her control.  Affects several different aspects of his or her life, such as work and relationships.  Causes distress that makes him or her unable to take part in normal activities.  Includes at least three physical symptoms of GAD, such as restlessness, fatigue, trouble concentrating, irritability, muscle tension, or sleep problems.  Before your health care provider can confirm a diagnosis of GAD, these symptoms must be present more days than they are not, and they must last for six months or longer. How is this treated? The following therapies are usually used to treat GAD:  Medicine. Antidepressant medicine is usually prescribed for long-term daily control. Antianxiety medicines may be added in severe cases, especially when panic attacks occur.  Talk therapy (psychotherapy). Certain types of talk therapy can be helpful in treating GAD by providing support, education, and guidance. Options include: ? Cognitive behavioral therapy (CBT). People learn coping skills and techniques to ease their anxiety. They learn to identify unrealistic or negative thoughts and behaviors and to replace them with positive ones. ? Acceptance and commitment therapy (ACT). This treatment teaches people how to be mindful as a way to cope with unwanted thoughts and feelings. ? Biofeedback. This process trains you to manage your body's response (physiological response) through breathing techniques and relaxation methods. You will work with a therapist while machines are used to monitor your physical symptoms.  Stress management techniques. These include yoga, meditation, and exercise.  A mental health specialist can help determine which treatment is best for you. Some people see  improvement with one type of therapy. However, other people require a combination of therapies. Follow these instructions at home:  Take over-the-counter and prescription medicines only as told by your health care provider.  Try to maintain a normal routine.  Try to anticipate stressful situations and allow extra time to manage  them.  Practice any stress management or self-calming techniques as taught by your health care provider.  Do not punish yourself for setbacks or for not making progress.  Try to recognize your accomplishments, even if they are small.  Keep all follow-up visits as told by your health care provider. This is important. Contact a health care provider if:  Your symptoms do not get better.  Your symptoms get worse.  You have signs of depression, such as: ? A persistently sad, cranky, or irritable mood. ? Loss of enjoyment in activities that used to bring you joy. ? Change in weight or eating. ? Changes in sleeping habits. ? Avoiding friends or family members. ? Loss of energy for normal tasks. ? Feelings of guilt or worthlessness. Get help right away if:  You have serious thoughts about hurting yourself or others. If you ever feel like you may hurt yourself or others, or have thoughts about taking your own life, get help right away. You can go to your nearest emergency department or call:  Your local emergency services (911 in the [place]  A suicide crisis helpline, such as the [facility] at 914 404 2102. This is open 24 hours a day.  Summary  Generalized anxiety disorder (GAD) is a mental health disorder that involves worry that is not triggered by a specific event.  People with GAD often worry excessively about many things in their lives, such as their health and family.  GAD may cause physical symptoms such as restlessness, trouble concentrating, sleep problems, frequent sweating, nausea, diarrhea, headaches, and trembling  or muscle twitching.  A mental health specialist can help determine which treatment is best for you. Some people see improvement with one type of therapy. However, other people require a combination of therapies. This information is not intended to replace advice given to you by your health care provider. Make sure you discuss any questions you have with your health care provider. Document Released: 03/22/2013 Document Revised: 10/15/2016 Document Reviewed: 10/15/2016 Elsevier Interactive Patient Education  2018 [employer].     Has appointment with Psychiatrist next October.   Agustina Caroli, MD [facility]

## 2017-08-06 NOTE — Patient Instructions (Addendum)
We recommend that you schedule a mammogram for breast cancer screening. Typically, you do not need a referral to do this. Please contact a local imaging center to schedule your mammogram.  [facility] - [phone]  *ask for the Radiology Department [facility] ([place]) - [phone] or [phone]  [facility] - [phone] [place] [phone] [facility] - [phone]  *ask for the [facility] - [phone]  *ask for the Radiology Department [facility] - [phone]  *ask for the [facility] - [phone]    IF you received an x-ray today, you will receive an invoice from [facility]. Please contact [facility] at [phone] with questions or concerns regarding your invoice.   IF you received labwork today, you will receive an invoice from [facility]. Please contact [facility] at [phone] with questions or concerns regarding your invoice.   Our billing staff will not be able to assist you with questions regarding bills from these companies.  You will be contacted with the lab results as soon as they are available. The fastest way to get your results is to activate your My Chart account. Instructions are located on the last page of this paperwork. If you have not heard from [place] regarding the results in 2 weeks, please contact this office.     Generalized Anxiety Disorder, Adult Generalized anxiety disorder (GAD) is a mental health disorder. People with this condition constantly worry about everyday events. Unlike normal anxiety, worry related to GAD is not triggered by a specific event. These worries also do not fade or get better with time. GAD interferes with life functions, including relationships, work, and school. GAD can vary from mild to severe. People with severe GAD can have intense waves of anxiety  with physical symptoms (panic attacks). What are the causes? The exact cause of GAD is not known. What increases the risk? This condition is more likely to develop in:  Women.  People who have a family history of anxiety disorders.  People who are very shy.  People who experience very stressful life events, such as the death of a loved one.  People who have a very stressful family environment.  What are the signs or symptoms? People with GAD often worry excessively about many things in their lives, such as their health and family. They may also be overly concerned about:  Doing well at work.  Being on time.  Natural disasters.  Friendships.  Physical symptoms of GAD include:  Fatigue.  Muscle tension or having muscle twitches.  Trembling or feeling shaky.  Being easily startled.  Feeling like your heart is pounding or racing.  Feeling out of breath or like you cannot take a deep breath.  Having trouble falling asleep or staying asleep.  Sweating.  Nausea, diarrhea, or irritable bowel syndrome (IBS).  Headaches.  Trouble concentrating or remembering facts.  Restlessness.  Irritability.  How is this diagnosed? Your health care provider can diagnose GAD based on your symptoms and medical history. You will also have a physical exam. The health care provider will ask specific questions about your symptoms, including how severe they are, when they started, and if they come and go. Your health care provider may ask you about your use of alcohol or drugs, including prescription medicines. Your health care provider may refer you to a mental health specialist for further evaluation. Your health care provider will  do a thorough examination and may perform additional tests to rule out other possible causes of your symptoms. To be diagnosed with GAD, a person must have anxiety that:  Is out of his or her control.  Affects several different aspects of his or her life,  such as work and relationships.  Causes distress that makes him or her unable to take part in normal activities.  Includes at least three physical symptoms of GAD, such as restlessness, fatigue, trouble concentrating, irritability, muscle tension, or sleep problems.  Before your health care provider can confirm a diagnosis of GAD, these symptoms must be present more days than they are not, and they must last for six months or longer. How is this treated? The following therapies are usually used to treat GAD:  Medicine. Antidepressant medicine is usually prescribed for long-term daily control. Antianxiety medicines may be added in severe cases, especially when panic attacks occur.  Talk therapy (psychotherapy). Certain types of talk therapy can be helpful in treating GAD by providing support, education, and guidance. Options include: ? Cognitive behavioral therapy (CBT). People learn coping skills and techniques to ease their anxiety. They learn to identify unrealistic or negative thoughts and behaviors and to replace them with positive ones. ? Acceptance and commitment therapy (ACT). This treatment teaches people how to be mindful as a way to cope with unwanted thoughts and feelings. ? Biofeedback. This process trains you to manage your body's response (physiological response) through breathing techniques and relaxation methods. You will work with a therapist while machines are used to monitor your physical symptoms.  Stress management techniques. These include yoga, meditation, and exercise.  A mental health specialist can help determine which treatment is best for you. Some people see improvement with one type of therapy. However, other people require a combination of therapies. Follow these instructions at home:  Take over-the-counter and prescription medicines only as told by your health care provider.  Try to maintain a normal routine.  Try to anticipate stressful situations and allow  extra time to manage them.  Practice any stress management or self-calming techniques as taught by your health care provider.  Do not punish yourself for setbacks or for not making progress.  Try to recognize your accomplishments, even if they are small.  Keep all follow-up visits as told by your health care provider. This is important. Contact a health care provider if:  Your symptoms do not get better.  Your symptoms get worse.  You have signs of depression, such as: ? A persistently sad, cranky, or irritable mood. ? Loss of enjoyment in activities that used to bring you joy. ? Change in weight or eating. ? Changes in sleeping habits. ? Avoiding friends or family members. ? Loss of energy for normal tasks. ? Feelings of guilt or worthlessness. Get help right away if:  You have serious thoughts about hurting yourself or others. If you ever feel like you may hurt yourself or others, or have thoughts about taking your own life, get help right away. You can go to your nearest emergency department or call:  Your local emergency services (911 in the [place]  A suicide crisis helpline, such as the [facility] at 7177768823. This is open 24 hours a day.  Summary  Generalized anxiety disorder (GAD) is a mental health disorder that involves worry that is not triggered by a specific event.  People with GAD often worry excessively about many things in their lives, such as their health and  family.  GAD may cause physical symptoms such as restlessness, trouble concentrating, sleep problems, frequent sweating, nausea, diarrhea, headaches, and trembling or muscle twitching.  A mental health specialist can help determine which treatment is best for you. Some people see improvement with one type of therapy. However, other people require a combination of therapies. This information is not intended to replace advice given to you by your health care provider. Make  sure you discuss any questions you have with your health care provider. Document Released: 03/22/2013 Document Revised: 10/15/2016 Document Reviewed: 10/15/2016 Elsevier Interactive Patient Education  [employer].

## 2017-08-07 ENCOUNTER — Telehealth: Payer: Self-pay | Admitting: Neurology

## 2017-08-07 NOTE — Telephone Encounter (Signed)
Rn Olivia Mackie @ [facility] called to inform pt cancelled nurse visit for today but RN Olivia Mackie states she did see pt either Mon or Tues of this week. If there are questions she can be reached at [phone]

## 2017-08-12 ENCOUNTER — Telehealth: Payer: Self-pay | Admitting: *Deleted

## 2017-08-12 NOTE — Telephone Encounter (Signed)
Faxed signed POC back to [facility]. Fax: [phone]. Received confirmation.

## 2017-08-14 ENCOUNTER — Telehealth: Payer: Self-pay | Admitting: Neurology

## 2017-08-14 NOTE — Telephone Encounter (Signed)
[facility] [phone] called the pt c/a homehealth nursing 9/4 and 9/6. FYI

## 2017-08-18 ENCOUNTER — Encounter: Payer: Self-pay | Admitting: Neurology

## 2017-08-19 DIAGNOSIS — F71 Moderate intellectual disabilities: Secondary | ICD-10-CM | POA: Diagnosis not present

## 2017-08-19 DIAGNOSIS — I69354 Hemiplegia and hemiparesis following cerebral infarction affecting left non-dominant side: Secondary | ICD-10-CM | POA: Diagnosis not present

## 2017-08-19 DIAGNOSIS — I10 Essential (primary) hypertension: Secondary | ICD-10-CM | POA: Diagnosis not present

## 2017-08-19 DIAGNOSIS — I69322 Dysarthria following cerebral infarction: Secondary | ICD-10-CM | POA: Diagnosis not present

## 2017-08-19 DIAGNOSIS — I69954 Hemiplegia and hemiparesis following unspecified cerebrovascular disease affecting left non-dominant side: Secondary | ICD-10-CM | POA: Diagnosis not present

## 2017-08-19 DIAGNOSIS — Z7984 Long term (current) use of oral hypoglycemic drugs: Secondary | ICD-10-CM | POA: Diagnosis not present

## 2017-08-19 DIAGNOSIS — R41841 Cognitive communication deficit: Secondary | ICD-10-CM | POA: Diagnosis not present

## 2017-08-19 DIAGNOSIS — F319 Bipolar disorder, unspecified: Secondary | ICD-10-CM | POA: Diagnosis not present

## 2017-08-19 DIAGNOSIS — E119 Type 2 diabetes mellitus without complications: Secondary | ICD-10-CM | POA: Diagnosis not present

## 2017-08-23 ENCOUNTER — Emergency Department (HOSPITAL_COMMUNITY)
Admission: EM | Admit: 2017-08-23 | Discharge: 2017-08-24 | Disposition: A | Discharge status: Home / Self Care | Payer: Medicare HMO | Attending: Emergency Medicine | Admitting: Emergency Medicine

## 2017-08-23 ENCOUNTER — Encounter (HOSPITAL_COMMUNITY): Payer: Self-pay | Admitting: Emergency Medicine

## 2017-08-23 ENCOUNTER — Emergency Department (HOSPITAL_COMMUNITY): Payer: Medicare HMO

## 2017-08-23 DIAGNOSIS — F70 Mild intellectual disabilities: Secondary | ICD-10-CM | POA: Insufficient documentation

## 2017-08-23 DIAGNOSIS — Z7984 Long term (current) use of oral hypoglycemic drugs: Secondary | ICD-10-CM | POA: Insufficient documentation

## 2017-08-23 DIAGNOSIS — R9431 Abnormal electrocardiogram [ECG] [EKG]: Secondary | ICD-10-CM | POA: Diagnosis not present

## 2017-08-23 DIAGNOSIS — I10 Essential (primary) hypertension: Secondary | ICD-10-CM | POA: Insufficient documentation

## 2017-08-23 DIAGNOSIS — G40909 Epilepsy, unspecified, not intractable, without status epilepticus: Secondary | ICD-10-CM

## 2017-08-23 DIAGNOSIS — Z7982 Long term (current) use of aspirin: Secondary | ICD-10-CM | POA: Diagnosis not present

## 2017-08-23 DIAGNOSIS — E119 Type 2 diabetes mellitus without complications: Secondary | ICD-10-CM | POA: Insufficient documentation

## 2017-08-23 DIAGNOSIS — R569 Unspecified convulsions: Secondary | ICD-10-CM | POA: Diagnosis not present

## 2017-08-23 LAB — I-STAT CHEM 8, ED
BUN: 24 mg/dL — AB (ref 6–20)
CHLORIDE: 96 mmol/L — AB (ref 101–111)
Calcium, Ion: 1.01 mmol/L — ABNORMAL LOW (ref 1.15–1.40)
Creatinine, Ser: 1 mg/dL (ref 0.44–1.00)
Glucose, Bld: 205 mg/dL — ABNORMAL HIGH (ref 65–99)
HEMATOCRIT: 36 % (ref 36.0–46.0)
Hemoglobin: 12.2 g/dL (ref 12.0–15.0)
POTASSIUM: 3.6 mmol/L (ref 3.5–5.1)
SODIUM: 137 mmol/L (ref 135–145)
TCO2: 31 mmol/L (ref 22–32)

## 2017-08-23 LAB — URINALYSIS, ROUTINE W REFLEX MICROSCOPIC
Bilirubin Urine: NEGATIVE
Glucose, UA: NEGATIVE mg/dL
Ketones, ur: NEGATIVE mg/dL
Leukocytes, UA: NEGATIVE
Nitrite: NEGATIVE
PROTEIN: NEGATIVE mg/dL
Specific Gravity, Urine: 1.014 (ref 1.005–1.030)
pH: 5 (ref 5.0–8.0)

## 2017-08-23 LAB — CBC
HCT: 34.9 % — ABNORMAL LOW (ref 36.0–46.0)
HEMOGLOBIN: 11.6 g/dL — AB (ref 12.0–15.0)
MCH: 30.3 pg (ref 26.0–34.0)
MCHC: 33.2 g/dL (ref 30.0–36.0)
MCV: 91.1 fL (ref 78.0–100.0)
PLATELETS: 250 10*3/uL (ref 150–400)
RBC: 3.83 MIL/uL — AB (ref 3.87–5.11)
RDW: 12.8 % (ref 11.5–15.5)
WBC: 8.7 10*3/uL (ref 4.0–10.5)

## 2017-08-23 LAB — I-STAT BETA HCG BLOOD, ED (MC, WL, AP ONLY): I-stat hCG, quantitative: <5 m[IU]/mL (ref <5)

## 2017-08-23 LAB — CBG MONITORING, ED: GLUCOSE-CAPILLARY: 208 mg/dL — AB (ref 65–99)

## 2017-08-23 LAB — PHENYTOIN LEVEL, TOTAL: PHENYTOIN LVL: 15.3 ug/mL (ref 10.0–20.0)

## 2017-08-23 MED ORDER — SODIUM CHLORIDE 0.9 % IV SOLN
1000.0000 mg | Freq: Once | INTRAVENOUS | Status: AC
  Administered 2017-08-23: 1000 mg via INTRAVENOUS
  Filled 2017-08-23: qty 10

## 2017-08-23 NOTE — ED Notes (Signed)
Bed: FM40 Expected date:  Expected time:  Means of arrival:  Comments: 46 yo F/Seizures

## 2017-08-23 NOTE — ED Notes (Signed)
Patient transported to CT 

## 2017-08-23 NOTE — ED Triage Notes (Signed)
Brought in by EMS from home with c/o seizures.  Pt has had 2 episodes of  seizures tonight---- one at 2000 and another at 2200, each lasting for 2 minutes (per family member).  Pt arrived to awake and alert/oriented x 4.  Pt has hx of seizures and takes Keppra and Dilantin.  Pt reported that she was "late taking her seizure medications tonight".

## 2017-08-24 DIAGNOSIS — R569 Unspecified convulsions: Secondary | ICD-10-CM | POA: Diagnosis not present

## 2017-08-24 NOTE — ED Notes (Signed)
Returned from CT.

## 2017-08-24 NOTE — ED Provider Notes (Signed)
[facility] DEPT Provider Note   CSN: [id number] Arrival date & time: 08/23/17  2242     History   Chief Complaint Chief Complaint  Patient presents with  . Seizures    HPI Brandi Gomez is a 46 y.o. female.  The history is provided by the patient.  Seizures   This is a recurrent problem. The current episode started 3 to 5 hours ago. The problem has been resolved. There were 2 to 3 seizures. Associated symptoms include sleepiness. Pertinent negatives include no speech difficulty and no chest pain. Characteristics include rhythmic jerking. The episode was witnessed. There was no sensation of an aura present. The seizures did not continue in the ED. The seizure(s) had no focality. Possible causes include missed seizure meds. There has been no fever. There were no medications administered prior to arrival.    Past Medical History:  Diagnosis Date  . Anemia   . Anxiety   . Bipolar 1 disorder ([place])   . Common migraine 05/19/2015  . Depression   . Hypertension   . Mild mental retardation   . Obesity   . Partial complex seizure disorder with intractable epilepsy ([place]) 05/12/2014  . Seizures ([place])    intractable  . Sleep apnea   . Stroke ([place])   . Type II or unspecified type diabetes mellitus without mention of complication, not stated as uncontrolled     Patient Active Problem List   Diagnosis Date Noted  . History of recent stroke 08/06/2017  . Angioedema 06/03/2017  . OSA (obstructive sleep apnea)   . Cerebrovascular accident (CVA) ([place]) 05/20/2017  . Controlled type 2 diabetes mellitus without complication, without long-term current use of insulin ([place]) 05/08/2017  . Essential hypertension 07/27/2008  . Convulsions ([place]) 02/05/2007    Past Surgical History:  Procedure Laterality Date  . COLONOSCOPY     2012-normal , Dr Sharlett Iles  . ESOPHAGOGASTRODUODENOSCOPY     normal-Dr Patterson 2012  . LOOP RECORDER INSERTION N/A 05/30/2017   Procedure: Loop Recorder  Insertion;  Surgeon: Constance Haw, MD;  Location: [facility];  Service: Cardiovascular;  Laterality: N/A;  . MYRINGOTOMY WITH TUBE PLACEMENT    . NASAL SINUS SURGERY    . TEE WITHOUT CARDIOVERSION N/A 05/30/2017   Procedure: TRANSESOPHAGEAL ECHOCARDIOGRAM (TEE);  Surgeon: Acie Fredrickson Wonda Cheng, MD;  Location: [facility] ENDOSCOPY;  Service: Cardiovascular;  Laterality: N/A;    OB History    No data available       Home Medications    Prior to Admission medications   Medication Sig Start Date End Date Taking? Authorizing Provider  aspirin 325 MG tablet Take 1 tablet (325 mg total) by mouth daily. 05/23/17  Yes Barton Dubois, MD  atorvastatin (LIPITOR) 10 MG tablet Take 1 tablet (10 mg total) by mouth daily at 6 PM. 08/06/17  Yes Sagardia, Ines Bloomer, MD  carvedilol (COREG) 3.125 MG tablet Take 1 tablet (3.125 mg total) by mouth 2 (two) times daily. 05/22/17 05/22/18 Yes Barton Dubois, MD  diclofenac (VOLTAREN) 75 MG EC tablet Take 75 mg by mouth daily.  05/08/17  Yes [provider]  dicyclomine (BENTYL) 10 MG capsule Take 1 capsule (10 mg total) by mouth 3 (three) times daily before meals. 06/23/17  Yes Danis, Kirke Corin, MD  DULoxetine (CYMBALTA) 30 MG capsule Take 1 capsule (30 mg total) by mouth daily. 08/06/17 09/05/17 Yes Horald Pollen, MD  famotidine (PEPCID) 20 MG tablet TAKE 1 TABLET BY MOUTH TWICE A DAY 06/16/17  Yes Whiteheart, Cristal Ford, NP  levETIRAcetam (KEPPRA) 500 MG tablet TAKE 1 TABLET BY MOUTH TWICE A DAY Patient taking differently: Take 1/2 Tablet by mouth bid 05/20/17  Yes [name], MD  phenytoin (DILANTIN) 100 MG ER capsule TAKE 2 CAPSULES IN THE MORNING AND 3 CAPSULES IN THE EVENING Patient taking differently: Take 200 mg by mouth See admin instructions. TAKE 2 CAPSULES IN THE MORNING AND 2 CAPSULES IN THE EVENING 04/21/17  Yes [name], MD  sitaGLIPtin (JANUVIA) 50 MG tablet Take 1 tablet (50 mg total) by mouth daily. For control of  blood sugar 05/08/17  Yes Sagardia, Ines Bloomer, MD  torsemide (DEMADEX) 20 MG tablet Take 1 tablet (20 mg total) by mouth daily. 05/08/17  Yes Sagardia, Ines Bloomer, MD  ACCU-CHEK [facility] LANCETS lancets Use as instructed 06/06/17   Horald Pollen, MD  glucose blood test strip Use as instructed 06/06/17   Horald Pollen, MD    Family History Family History  Problem Relation Age of Onset  . Diabetes Mother        passed away from accidental death  . Hypertension Mother   . Bipolar disorder Father   . Diabetes Daughter   . Leukemia Daughter   . Ovarian cancer Maternal Aunt     Social History Social History  Substance Use Topics  . Smoking status: Never Smoker  . Smokeless tobacco: Never Used  . Alcohol use No     Allergies   Amoxicillin; Lisinopril; and Hydrocodone   Review of Systems Review of Systems  Constitutional: Negative for fever.  Respiratory: Negative for shortness of breath.   Cardiovascular: Negative for chest pain, palpitations and leg swelling.  Neurological: Positive for seizures. Negative for speech difficulty.  All other systems reviewed and are negative.    Physical Exam Updated Vital Signs BP 114/76   Pulse 70   Temp 98.1 F (36.7 C) (Oral)   Resp 15   Ht 5\' 6"  (1.676 m)   Wt 122.5 kg (270 lb)   LMP 07/28/2017   SpO2 99%   BMI 43.58 kg/m   Physical Exam  Constitutional: She appears well-developed and well-nourished. No distress.  HENT:  Head: Normocephalic and atraumatic.  Mouth/Throat: Oropharynx is clear and moist. No oropharyngeal exudate.  Eyes: Pupils are equal, round, and reactive to light. Conjunctivae are normal.  Neck: Normal range of motion. Neck supple.  Cardiovascular: Normal rate, regular rhythm, normal heart sounds and intact distal pulses.   Pulmonary/Chest: Effort normal and breath sounds normal. She has no wheezes. She has no rales.  Abdominal: Soft. Bowel sounds are normal. She exhibits no mass. There is no  tenderness. There is no rebound and no guarding.  Musculoskeletal: Normal range of motion.  Neurological: She is alert. She displays normal reflexes.  Skin: Skin is warm and dry. Capillary refill takes less than 2 seconds.  Psychiatric: She has a normal mood and affect.     ED Treatments / Results   Vitals:   08/24/17 0030 08/24/17 0100  BP: 116/80 114/76  Pulse: 80 70  Resp: (!) 24 15  Temp:    SpO2: 96% 99%    Labs (all labs ordered are listed, but only abnormal results are displayed)  Results for orders placed or performed during the hospital encounter of 08/23/17  Dilantin (phenytoin) Level (if patient is taking this medication)  Result Value Ref Range   Phenytoin Lvl 15.3 10.0 - 20.0 ug/mL  CBC - if new onset seizures  Result Value Ref Range   WBC 8.7 4.0 - 10.5 K/uL   RBC 3.83 (L) 3.87 - 5.11 MIL/uL   Hemoglobin 11.6 (L) 12.0 - 15.0 g/dL   HCT 34.9 (L) 36.0 - 46.0 %   MCV 91.1 78.0 - 100.0 fL   MCH 30.3 26.0 - 34.0 pg   MCHC 33.2 30.0 - 36.0 g/dL   RDW 12.8 11.5 - 15.5 %   Platelets 250 150 - 400 K/uL  Urinalysis, Routine w reflex microscopic  Result Value Ref Range   Color, Urine YELLOW YELLOW   APPearance HAZY (A) CLEAR   Specific Gravity, Urine 1.014 1.005 - 1.030   pH 5.0 5.0 - 8.0   Glucose, UA NEGATIVE NEGATIVE mg/dL   Hgb urine dipstick LARGE (A) NEGATIVE   Bilirubin Urine NEGATIVE NEGATIVE   Ketones, ur NEGATIVE NEGATIVE mg/dL   Protein, ur NEGATIVE NEGATIVE mg/dL   Nitrite NEGATIVE NEGATIVE   Leukocytes, UA NEGATIVE NEGATIVE   RBC / HPF 0-5 0 - 5 RBC/hpf   WBC, UA 0-5 0 - 5 WBC/hpf   Bacteria, UA RARE (A) NONE SEEN   Squamous Epithelial / LPF 0-5 (A) NONE SEEN   Mucus PRESENT    Hyaline Casts, UA PRESENT   CBG monitoring, ED  Result Value Ref Range   Glucose-Capillary 208 (H) 65 - 99 mg/dL  I-Stat beta hCG blood, ED  Result Value Ref Range   I-stat hCG, quantitative <5.0 <5 mIU/mL   Comment 3          I-Stat Chem 8, ED  Result Value Ref  Range   Sodium 137 135 - 145 mmol/L   Potassium 3.6 3.5 - 5.1 mmol/L   Chloride 96 (L) 101 - 111 mmol/L   BUN 24 (H) 6 - 20 mg/dL   Creatinine, Ser 1.00 0.44 - 1.00 mg/dL   Glucose, Bld 205 (H) 65 - 99 mg/dL   Calcium, Ion 1.01 (L) 1.15 - 1.40 mmol/L   TCO2 31 22 - 32 mmol/L   Hemoglobin 12.2 12.0 - 15.0 g/dL   HCT 36.0 36.0 - 46.0 %   Ct Head Wo Contrast  Result Date: 08/24/2017 CLINICAL DATA:  Seizures earlier in the evening EXAM: CT HEAD WITHOUT CONTRAST TECHNIQUE: Contiguous axial images were obtained from the base of the skull through the vertex without intravenous contrast. COMPARISON:  Head CT and brain MRI May 25, 2017 FINDINGS: Brain: The ventricles are normal in size and configuration. There is no intracranial mass, hemorrhage, extra-axial fluid collection, or midline shift. There is evidence of a prior small infarct in the superior, posterior right frontal lobe,, slightly less apparent than on prior studies. Elsewhere, there is rather minimal periventricular small vessel disease. No acute infarct is demonstrable on this study. Vascular: There is no hyperdense vessel evident. No focal vascular calcification evident. Skull: Bony calvarium appears intact. Sinuses/Orbits: Visualized paranasal sinuses are clear. Orbits appear symmetric bilaterally. Other: There is chronic mastoid disease in several peripheral mastoid air cells on the left. Mastoids on the right are hypoplastic with opacification in several right-sided mastoid air cells, stable. IMPRESSION: Prior focal infarct in the high posterior right frontal lobe, less apparent than on studies from 3 months prior. Rather minimal periventricular small vessel disease noted. No acute infarct evident. No mass, hemorrhage, or extra-axial fluid collection. Chronic mastoid air cell disease noted. Electronically Signed   By: Lowella Grip III M.D.   On: 08/24/2017 01:40    EKG  EKG Interpretation  Date/Time:  Saturday August 23 2017  23:04:31 EDT Ventricular Rate:  71 PR Interval:    QRS Duration: 81 QT Interval:  612 QTC Calculation: 666 R Axis:   76 Text Interpretation:  Sinus rhythm Low voltage, precordial leads Prolonged QT interval Confirmed by Dory Horn) on 08/23/2017 11:40:34 PM       Radiology Ct Head Wo Contrast  Result Date: 08/24/2017 CLINICAL DATA:  Seizures earlier in the evening EXAM: CT HEAD WITHOUT CONTRAST TECHNIQUE: Contiguous axial images were obtained from the base of the skull through the vertex without intravenous contrast. COMPARISON:  Head CT and brain MRI May 25, 2017 FINDINGS: Brain: The ventricles are normal in size and configuration. There is no intracranial mass, hemorrhage, extra-axial fluid collection, or midline shift. There is evidence of a prior small infarct in the superior, posterior right frontal lobe,, slightly less apparent than on prior studies. Elsewhere, there is rather minimal periventricular small vessel disease. No acute infarct is demonstrable on this study. Vascular: There is no hyperdense vessel evident. No focal vascular calcification evident. Skull: Bony calvarium appears intact. Sinuses/Orbits: Visualized paranasal sinuses are clear. Orbits appear symmetric bilaterally. Other: There is chronic mastoid disease in several peripheral mastoid air cells on the left. Mastoids on the right are hypoplastic with opacification in several right-sided mastoid air cells, stable. IMPRESSION: Prior focal infarct in the high posterior right frontal lobe, less apparent than on studies from 3 months prior. Rather minimal periventricular small vessel disease noted. No acute infarct evident. No mass, hemorrhage, or extra-axial fluid collection. Chronic mastoid air cell disease noted. Electronically Signed   By: Lowella Grip III M.D.   On: 08/24/2017 01:40    Procedures Procedures (including critical care time)  Medications Ordered in ED Medications  levETIRAcetam (KEPPRA)  1,000 mg in sodium chloride 0.9 % 100 mL IVPB (0 mg Intravenous Stopped 08/24/17 0000)       Final Clinical Impressions(s) / ED Diagnoses  Seizures secondary to not taking keppra.  Restart keppra at appropriate dose.  Follow up with your neurologist.  No driving! Strict return precautions given for  chest pain, dyspnea on exertion, new weakness or numbness changes in vision or speech,  Inability to tolerate liquids or food, changes in voice cough, altered mental status or any concerns. No signs of systemic illness or infection. The patient is nontoxic-appearing on exam and vital signs are within normal limits.    I have reviewed the triage vital signs and the nursing notes. Pertinent labs &imaging results that were available during my care of the patient were reviewed by me and considered in my medical decision making (see chart for details).  After history, exam, and medical workup I feel the patient has been appropriately medically screened and is safe for discharge home. Pertinent diagnoses were discussed with the patient. Patient was given return precautions.     Vayla Wilhelmi, MD 08/24/17 [id number]

## 2017-08-28 ENCOUNTER — Ambulatory Visit (INDEPENDENT_AMBULATORY_CARE_PROVIDER_SITE_OTHER): Payer: Medicare HMO | Admitting: *Deleted

## 2017-08-28 DIAGNOSIS — I639 Cerebral infarction, unspecified: Secondary | ICD-10-CM

## 2017-08-28 NOTE — Progress Notes (Signed)
Carelink Summary Report / Loop Recorder 

## 2017-08-29 DIAGNOSIS — R41841 Cognitive communication deficit: Secondary | ICD-10-CM | POA: Diagnosis not present

## 2017-08-29 DIAGNOSIS — I10 Essential (primary) hypertension: Secondary | ICD-10-CM | POA: Diagnosis not present

## 2017-08-29 DIAGNOSIS — Z7984 Long term (current) use of oral hypoglycemic drugs: Secondary | ICD-10-CM | POA: Diagnosis not present

## 2017-08-29 DIAGNOSIS — I69354 Hemiplegia and hemiparesis following cerebral infarction affecting left non-dominant side: Secondary | ICD-10-CM | POA: Diagnosis not present

## 2017-08-29 DIAGNOSIS — I69322 Dysarthria following cerebral infarction: Secondary | ICD-10-CM | POA: Diagnosis not present

## 2017-08-29 DIAGNOSIS — I69954 Hemiplegia and hemiparesis following unspecified cerebrovascular disease affecting left non-dominant side: Secondary | ICD-10-CM | POA: Diagnosis not present

## 2017-08-29 DIAGNOSIS — F319 Bipolar disorder, unspecified: Secondary | ICD-10-CM | POA: Diagnosis not present

## 2017-08-29 DIAGNOSIS — E119 Type 2 diabetes mellitus without complications: Secondary | ICD-10-CM | POA: Diagnosis not present

## 2017-08-29 DIAGNOSIS — F71 Moderate intellectual disabilities: Secondary | ICD-10-CM | POA: Diagnosis not present

## 2017-08-29 LAB — CUP PACEART REMOTE DEVICE CHECK
Date Time Interrogation Session: 20180920143634
MDC IDC PG IMPLANT DT: 20180622

## 2017-09-01 ENCOUNTER — Ambulatory Visit (HOSPITAL_COMMUNITY)
Admission: EM | Admit: 2017-09-01 | Discharge: 2017-09-01 | Disposition: A | Discharge status: Home / Self Care | Payer: Medicare HMO | Attending: Family Medicine | Admitting: Family Medicine

## 2017-09-01 ENCOUNTER — Encounter (HOSPITAL_COMMUNITY): Payer: Self-pay | Admitting: Emergency Medicine

## 2017-09-01 ENCOUNTER — Telehealth: Payer: Self-pay | Admitting: *Deleted

## 2017-09-01 ENCOUNTER — Ambulatory Visit: Payer: Medicare HMO | Admitting: Family Medicine

## 2017-09-01 DIAGNOSIS — Z1629 Resistance to other single specified antibiotic: Secondary | ICD-10-CM | POA: Diagnosis not present

## 2017-09-01 DIAGNOSIS — N3090 Cystitis, unspecified without hematuria: Secondary | ICD-10-CM | POA: Diagnosis not present

## 2017-09-01 DIAGNOSIS — B9620 Unspecified Escherichia coli [E. coli] as the cause of diseases classified elsewhere: Secondary | ICD-10-CM | POA: Diagnosis not present

## 2017-09-01 DIAGNOSIS — Z1611 Resistance to penicillins: Secondary | ICD-10-CM | POA: Insufficient documentation

## 2017-09-01 DIAGNOSIS — B961 Klebsiella pneumoniae [K. pneumoniae] as the cause of diseases classified elsewhere: Secondary | ICD-10-CM | POA: Diagnosis not present

## 2017-09-01 DIAGNOSIS — N939 Abnormal uterine and vaginal bleeding, unspecified: Secondary | ICD-10-CM

## 2017-09-01 LAB — POCT URINALYSIS DIP (DEVICE)
Glucose, UA: NEGATIVE mg/dL
Glucose, UA: NEGATIVE mg/dL
Ketones, ur: NEGATIVE mg/dL
Nitrite: POSITIVE — AB
Nitrite: POSITIVE — AB
PH: 5.5 (ref 5.0–8.0)
Protein, ur: >=300 mg/dL — AB
Protein, ur: >=300 mg/dL — AB
SPECIFIC GRAVITY, URINE: 1.02 (ref 1.005–1.030)
SPECIFIC GRAVITY, URINE: 1.02 (ref 1.005–1.030)
Urobilinogen, UA: 1 mg/dL (ref 0.0–1.0)
Urobilinogen, UA: 1 mg/dL (ref 0.0–1.0)
pH: 5.5 (ref 5.0–8.0)

## 2017-09-01 MED ORDER — NITROFURANTOIN MONOHYD MACRO 100 MG PO CAPS: 100.0000 mg | ORAL_CAPSULE | Freq: Two times a day (BID) | ORAL | 0 refills | Status: DC

## 2017-09-01 NOTE — ED Provider Notes (Signed)
[facility]    CSN: [id number] Arrival date & time: 09/01/17  1046     History   Chief Complaint Chief Complaint  Patient presents with  . Vaginal Bleeding  . Abdominal Pain    HPI Brandi Gomez is a 46 y.o. female.   46 year old female with history of anemia, anxiety, bipolar 1 disorder, hypertension, mild mental retardation, diabetes, comes in for evaluation of vaginal bleeding and lower abdominal pain. Patient states she has troubles with memory and history provided by her with assistance of family member. Patient states she was told to be perimenopausal recently, with abnormal cycles. She had a cycle 2 weeks ago, which stopped completely, they restarted 3-4 days ago. She's been having clots and lower abdominal cramping since. States cramping is constant, without any aggravating or alleviating factor. She does state that she has less of an appetite, and has been losing weight. Denies nausea, vomiting, diarrhea, constipation. Last bowel movement yesterday without straining. Denies fever, chills, night sweats. Denies urinary frequency, dysuria, hematuria. Denies other vaginal discharge, itchiness/pain. She is sexually active, with one partner, without condom use. Denies weakness, dizziness.       Past Medical History:  Diagnosis Date  . Anemia   . Anxiety   . Bipolar 1 disorder ([place])   . Common migraine 05/19/2015  . Depression   . Hypertension   . Mild mental retardation   . Obesity   . Partial complex seizure disorder with intractable epilepsy ([place]) 05/12/2014  . Seizures ([place])    intractable  . Sleep apnea   . Stroke ([place])   . Type II or unspecified type diabetes mellitus without mention of complication, not stated as uncontrolled     Patient Active Problem List   Diagnosis Date Noted  . History of recent stroke 08/06/2017  . Angioedema 06/03/2017  . OSA (obstructive sleep apnea)   . Cerebrovascular accident (CVA) ([place]) 05/20/2017  . Controlled type  2 diabetes mellitus without complication, without long-term current use of insulin ([place]) 05/08/2017  . Essential hypertension 07/27/2008  . Convulsions ([place]) 02/05/2007    Past Surgical History:  Procedure Laterality Date  . COLONOSCOPY     2012-normal , Dr Sharlett Iles  . ESOPHAGOGASTRODUODENOSCOPY     normal-Dr Patterson 2012  . LOOP RECORDER INSERTION N/A 05/30/2017   Procedure: Loop Recorder Insertion;  Surgeon: Constance Haw, MD;  Location: [facility];  Service: Cardiovascular;  Laterality: N/A;  . MYRINGOTOMY WITH TUBE PLACEMENT    . NASAL SINUS SURGERY    . TEE WITHOUT CARDIOVERSION N/A 05/30/2017   Procedure: TRANSESOPHAGEAL ECHOCARDIOGRAM (TEE);  Surgeon: Acie Fredrickson Wonda Cheng, MD;  Location: [facility] ENDOSCOPY;  Service: Cardiovascular;  Laterality: N/A;    OB History    No data available       Home Medications    Prior to Admission medications   Medication Sig Start Date End Date Taking? Authorizing Provider  ACCU-CHEK SOFTCLIX LANCETS lancets Use as instructed 06/06/17   Horald Pollen, MD  aspirin 325 MG tablet Take 1 tablet (325 mg total) by mouth daily. 05/23/17   Barton Dubois, MD  atorvastatin (LIPITOR) 10 MG tablet Take 1 tablet (10 mg total) by mouth daily at 6 PM. 08/06/17   Horald Pollen, MD  carvedilol (COREG) 3.125 MG tablet Take 1 tablet (3.125 mg total) by mouth 2 (two) times daily. 05/22/17 05/22/18  Barton Dubois, MD  diclofenac (VOLTAREN) 75 MG EC tablet Take 75 mg by mouth daily.  05/08/17  [provider]  dicyclomine (BENTYL) 10 MG capsule Take 1 capsule (10 mg total) by mouth 3 (three) times daily before meals. 06/23/17   Doran Stabler, MD  DULoxetine (CYMBALTA) 30 MG capsule Take 1 capsule (30 mg total) by mouth daily. 08/06/17 09/05/17  Horald Pollen, MD  famotidine (PEPCID) 20 MG tablet TAKE 1 TABLET BY MOUTH TWICE A DAY 06/16/17   Whiteheart, Cristal Ford, NP  glucose blood test strip Use as instructed 06/06/17    Horald Pollen, MD  levETIRAcetam (KEPPRA) 500 MG tablet TAKE 1 TABLET BY MOUTH TWICE A DAY Patient taking differently: Take 1/2 Tablet by mouth bid 05/20/17   [name], MD  nitrofurantoin, macrocrystal-monohydrate, (MACROBID) 100 MG capsule Take 1 capsule (100 mg total) by mouth 2 (two) times daily. 09/01/17   Tasia Catchings, Britian Jentz V, PA-C  phenytoin (DILANTIN) 100 MG ER capsule TAKE 2 CAPSULES IN THE MORNING AND 3 CAPSULES IN THE EVENING Patient taking differently: Take 200 mg by mouth See admin instructions. TAKE 2 CAPSULES IN THE MORNING AND 2 CAPSULES IN THE EVENING 04/21/17   [name], MD  sitaGLIPtin (JANUVIA) 50 MG tablet Take 1 tablet (50 mg total) by mouth daily. For control of blood sugar 05/08/17   Sagardia, Ines Bloomer, MD  torsemide (DEMADEX) 20 MG tablet Take 1 tablet (20 mg total) by mouth daily. 05/08/17   Horald Pollen, MD    Family History Family History  Problem Relation Age of Onset  . Diabetes Mother        passed away from accidental death  . Hypertension Mother   . Bipolar disorder Father   . Diabetes Daughter   . Leukemia Daughter   . Ovarian cancer Maternal Aunt     Social History Social History  Substance Use Topics  . Smoking status: Never Smoker  . Smokeless tobacco: Never Used  . Alcohol use No     Allergies   Amoxicillin; Lisinopril; and Hydrocodone   Review of Systems Review of Systems  Reason unable to perform ROS: See HPI as above.     Physical Exam Triage Vital Signs ED Triage Vitals  Enc Vitals Group     BP 09/01/17 1225 (!) 121/52     Pulse Rate 09/01/17 1225 87     Resp 09/01/17 1225 16     Temp 09/01/17 1225 98.8 F (37.1 C)     Temp Source 09/01/17 1225 Oral     SpO2 09/01/17 1225 99 %     Weight 09/01/17 1224 245 lb (111.1 kg)     Height 09/01/17 1224 5\' 1"  (1.549 m)     Head Circumference --      Peak Flow --      Pain Score 09/01/17 1224 5     Pain Loc --      Pain Edu? --      Excl. in [place]? --    No  data found.   Updated Vital Signs BP (!) 121/52   Pulse 87   Temp 98.8 F (37.1 C) (Oral)   Resp 16   Ht 5\' 1"  (1.549 m)   Wt 245 lb (111.1 kg)   LMP 08/18/2017   SpO2 99%   BMI 46.29 kg/m      Physical Exam  Constitutional: She is oriented to person, place, and time. She appears well-developed and well-nourished. No distress.  HENT:  Head: Normocephalic and atraumatic.  Eyes: Pupils are equal, round, and reactive to light. Conjunctivae  are normal.  Cardiovascular: Normal rate, regular rhythm and normal heart sounds.  Exam reveals no gallop and no friction rub.   No murmur heard. Pulmonary/Chest: Effort normal and breath sounds normal. She has no wheezes. She has no rales.  Abdominal: Soft. Bowel sounds are normal. She exhibits no mass. There is no tenderness. There is no rebound, no guarding and no CVA tenderness.  Genitourinary: Uterus normal. Cervix exhibits no motion tenderness and no friability. Right adnexum displays no mass and no tenderness. Left adnexum displays no mass and no tenderness. There is bleeding in the vagina.  Neurological: She is alert and oriented to person, place, and time.  Skin: Skin is warm and dry.  Psychiatric: She has a normal mood and affect. Her behavior is normal. Judgment normal.     UC Treatments / Results  Labs (all labs ordered are listed, but only abnormal results are displayed) Labs Reviewed  POCT URINALYSIS DIP (DEVICE) - Abnormal; Notable for the following:       Result Value   Bilirubin Urine SMALL (*)    Hgb urine dipstick LARGE (*)    Protein, ur >=300 (*)    Nitrite POSITIVE (*)    Leukocytes, UA TRACE (*)    All other components within normal limits  URINE CULTURE  CERVICOVAGINAL ANCILLARY ONLY    EKG  EKG Interpretation None       Radiology No results found.  Procedures Procedures (including critical care time)  Medications Ordered in UC Medications - No data to display   Initial Impression / Assessment  and Plan / UC Course  I have reviewed the triage vital signs and the nursing notes.  Pertinent labs & imaging results that were available during my care of the patient were reviewed by me and considered in my medical decision making (see chart for details).    Urine dipstick positive for UTI. Start antibiotics as directed. Push fluids. Cytology sent, patient will be contacted with any positive results that require additional treatment. Patient to refrain from sexual activity for the next 7 days. Patient to follow up with GYN for further evaluation of vaginal bleeding, perimenopausal symptoms. . Return precautions given.   Final Clinical Impressions(s) / UC Diagnoses   Final diagnoses:  Cystitis  Vaginal bleeding    New Prescriptions New Prescriptions   NITROFURANTOIN, MACROCRYSTAL-MONOHYDRATE, (MACROBID) 100 MG CAPSULE    Take 1 capsule (100 mg total) by mouth 2 (two) times daily.      Ok Edwards, PA-C 09/01/17 1352

## 2017-09-01 NOTE — ED Triage Notes (Signed)
PT reports she is perimenopausal. PT had a menstrual 2 weeks ago. Bleeding returned 3-4 days ago. Bleeding is heavy with some clotting. PT reports lower abdominal pain and no appetite.

## 2017-09-01 NOTE — Telephone Encounter (Signed)
Faxed signed orders back to [facility] re: "Additional skilled nursing visits for med management, disease education" to [phone]. Received fax confirmation.

## 2017-09-01 NOTE — Discharge Instructions (Signed)
Your urine was positive for an urinary tract infection. Start Macrobid as directed. Keep hydrated, your urine should be clear to pale yellow in color. Monitor for any worsening of symptoms, fever, worsening abdominal pain, nausea/vomiting, flank pain, follow up for reevaluation.   Follow up with GYN for further evaluation of abnormal vaginal bleeding. Cytology sent, you will be contacted with any positive results that requires further treatment. Refrain from sexual activity for the next 7 days. Monitor for any worsening of symptoms, fever, abdominal pain, nausea, vomiting, to follow up for reevaluation.

## 2017-09-02 LAB — CERVICOVAGINAL ANCILLARY ONLY
Bacterial vaginitis: NEGATIVE
CHLAMYDIA, DNA PROBE: NEGATIVE
Candida vaginitis: NEGATIVE
NEISSERIA GONORRHEA: NEGATIVE
TRICH (WINDOWPATH): NEGATIVE

## 2017-09-05 LAB — URINE CULTURE

## 2017-09-08 ENCOUNTER — Telehealth (HOSPITAL_COMMUNITY): Payer: Self-pay | Admitting: *Deleted

## 2017-09-08 NOTE — Telephone Encounter (Signed)
Results given. Patient feeling better. Will continue antibiotic and follow up as needed.

## 2017-09-10 ENCOUNTER — Telehealth: Payer: Self-pay | Admitting: Cardiology

## 2017-09-10 NOTE — Telephone Encounter (Signed)
LMOVM requesting that pt send manual transmission b/c home monitor has not updated in at least 14 days.    

## 2017-09-16 ENCOUNTER — Ambulatory Visit: Payer: Self-pay | Admitting: Neurology

## 2017-09-16 ENCOUNTER — Telehealth: Payer: Self-pay | Admitting: Neurology

## 2017-09-16 DIAGNOSIS — I69322 Dysarthria following cerebral infarction: Secondary | ICD-10-CM | POA: Diagnosis not present

## 2017-09-16 DIAGNOSIS — E119 Type 2 diabetes mellitus without complications: Secondary | ICD-10-CM | POA: Diagnosis not present

## 2017-09-16 DIAGNOSIS — I69354 Hemiplegia and hemiparesis following cerebral infarction affecting left non-dominant side: Secondary | ICD-10-CM | POA: Diagnosis not present

## 2017-09-16 DIAGNOSIS — F319 Bipolar disorder, unspecified: Secondary | ICD-10-CM | POA: Diagnosis not present

## 2017-09-16 DIAGNOSIS — I10 Essential (primary) hypertension: Secondary | ICD-10-CM | POA: Diagnosis not present

## 2017-09-16 DIAGNOSIS — R41841 Cognitive communication deficit: Secondary | ICD-10-CM | POA: Diagnosis not present

## 2017-09-16 DIAGNOSIS — Z7984 Long term (current) use of oral hypoglycemic drugs: Secondary | ICD-10-CM | POA: Diagnosis not present

## 2017-09-16 DIAGNOSIS — F71 Moderate intellectual disabilities: Secondary | ICD-10-CM | POA: Diagnosis not present

## 2017-09-16 NOTE — Telephone Encounter (Signed)
Yolanda from [facility] is asking for a call back to confirm the dosage of levETIRAcetam (KEPPRA) 500 MG tablet pt is to take.  She states pt thought there was a change to medication, please call her at [phone]

## 2017-09-16 NOTE — Telephone Encounter (Signed)
I called patient. The patient was in the emergency room on 09/01/2017 with 2 seizures that day. She had been on Keppra taking one half of a 500 mg tablet twice daily, the medication was increased to a full tablet 500 mg twice daily. The patient also remains on Dilantin.

## 2017-09-17 ENCOUNTER — Encounter (HOSPITAL_COMMUNITY): Payer: Self-pay | Admitting: Emergency Medicine

## 2017-09-17 ENCOUNTER — Emergency Department (HOSPITAL_COMMUNITY)
Admission: EM | Admit: 2017-09-17 | Discharge: 2017-09-17 | Disposition: A | Discharge status: Home / Self Care | Payer: Medicare HMO | Attending: Emergency Medicine | Admitting: Emergency Medicine

## 2017-09-17 DIAGNOSIS — R112 Nausea with vomiting, unspecified: Secondary | ICD-10-CM | POA: Diagnosis not present

## 2017-09-17 DIAGNOSIS — E119 Type 2 diabetes mellitus without complications: Secondary | ICD-10-CM | POA: Insufficient documentation

## 2017-09-17 DIAGNOSIS — Z7984 Long term (current) use of oral hypoglycemic drugs: Secondary | ICD-10-CM | POA: Insufficient documentation

## 2017-09-17 DIAGNOSIS — K2970 Gastritis, unspecified, without bleeding: Secondary | ICD-10-CM | POA: Diagnosis not present

## 2017-09-17 DIAGNOSIS — Z8673 Personal history of transient ischemic attack (TIA), and cerebral infarction without residual deficits: Secondary | ICD-10-CM | POA: Insufficient documentation

## 2017-09-17 DIAGNOSIS — R1013 Epigastric pain: Secondary | ICD-10-CM | POA: Diagnosis not present

## 2017-09-17 DIAGNOSIS — Z79899 Other long term (current) drug therapy: Secondary | ICD-10-CM | POA: Insufficient documentation

## 2017-09-17 LAB — COMPREHENSIVE METABOLIC PANEL
ALBUMIN: 3.5 g/dL (ref 3.5–5.0)
ALK PHOS: 161 U/L — AB (ref 38–126)
ALT: 26 U/L (ref 14–54)
ANION GAP: 13 (ref 5–15)
AST: 28 U/L (ref 15–41)
BILIRUBIN TOTAL: 0.7 mg/dL (ref 0.3–1.2)
BUN: 14 mg/dL (ref 6–20)
CALCIUM: 8.6 mg/dL — AB (ref 8.9–10.3)
CO2: 34 mmol/L — ABNORMAL HIGH (ref 22–32)
Chloride: 89 mmol/L — ABNORMAL LOW (ref 101–111)
Creatinine, Ser: 1.27 mg/dL — ABNORMAL HIGH (ref 0.44–1.00)
GFR calc Af Amer: 57 mL/min — ABNORMAL LOW (ref >60)
GFR, EST NON AFRICAN AMERICAN: 49 mL/min — AB (ref >60)
GLUCOSE: 219 mg/dL — AB (ref 65–99)
Potassium: 2.8 mmol/L — ABNORMAL LOW (ref 3.5–5.1)
Sodium: 136 mmol/L (ref 135–145)
TOTAL PROTEIN: 7.6 g/dL (ref 6.5–8.1)

## 2017-09-17 LAB — I-STAT TROPONIN, ED: Troponin i, poc: 0 ng/mL (ref 0.00–0.08)

## 2017-09-17 LAB — CBC
HCT: 35.7 % — ABNORMAL LOW (ref 36.0–46.0)
Hemoglobin: 11.7 g/dL — ABNORMAL LOW (ref 12.0–15.0)
MCH: 30.5 pg (ref 26.0–34.0)
MCHC: 32.8 g/dL (ref 30.0–36.0)
MCV: 93.2 fL (ref 78.0–100.0)
PLATELETS: 224 10*3/uL (ref 150–400)
RBC: 3.83 MIL/uL — ABNORMAL LOW (ref 3.87–5.11)
RDW: 13.3 % (ref 11.5–15.5)
WBC: 6.7 10*3/uL (ref 4.0–10.5)

## 2017-09-17 LAB — URINALYSIS, ROUTINE W REFLEX MICROSCOPIC
Bilirubin Urine: NEGATIVE
GLUCOSE, UA: NEGATIVE mg/dL
HGB URINE DIPSTICK: NEGATIVE
Ketones, ur: NEGATIVE mg/dL
LEUKOCYTES UA: NEGATIVE
NITRITE: NEGATIVE
PH: 5 (ref 5.0–8.0)
Protein, ur: 30 mg/dL — AB
SPECIFIC GRAVITY, URINE: 1.026 (ref 1.005–1.030)

## 2017-09-17 LAB — LIPASE, BLOOD: Lipase: 53 U/L — ABNORMAL HIGH (ref 11–51)

## 2017-09-17 LAB — POC URINE PREG, ED: Preg Test, Ur: NEGATIVE

## 2017-09-17 LAB — MAGNESIUM: MAGNESIUM: 1.7 mg/dL (ref 1.7–2.4)

## 2017-09-17 MED ORDER — GI COCKTAIL ~~LOC~~
30.0000 mL | Freq: Once | ORAL | Status: AC
  Administered 2017-09-17: 30 mL via ORAL
  Filled 2017-09-17: qty 30

## 2017-09-17 MED ORDER — POTASSIUM CHLORIDE CRYS ER 20 MEQ PO TBCR
80.0000 meq | EXTENDED_RELEASE_TABLET | Freq: Once | ORAL | Status: AC
  Administered 2017-09-17: 80 meq via ORAL
  Filled 2017-09-17: qty 4

## 2017-09-17 MED ORDER — ALUM & MAG HYDROXIDE-SIMETH 400-400-40 MG/5ML PO SUSP: 10.0000 mL | Freq: Four times a day (QID) | ORAL | 0 refills | Status: DC | PRN

## 2017-09-17 MED ORDER — DICYCLOMINE HCL 20 MG PO TABS: 20.0000 mg | ORAL_TABLET | Freq: Two times a day (BID) | ORAL | 0 refills | Status: DC | PRN

## 2017-09-17 MED ORDER — DICYCLOMINE HCL 10 MG PO CAPS
10.0000 mg | ORAL_CAPSULE | Freq: Once | ORAL | Status: AC
  Administered 2017-09-17: 10 mg via ORAL
  Filled 2017-09-17: qty 1

## 2017-09-17 NOTE — Discharge Instructions (Signed)
Your symptoms are likely related to indigestion. Avoid ibuprofen, and take tylenol and bentyl for abdominal pain. Also do not forget to keep taking your pepcid.  Take medications as prescribed for your symptoms.  Return without fail for worsening symptoms, including fever, worsening pain, intractable vomiting, or any other symptoms concerning to you.

## 2017-09-17 NOTE — ED Provider Notes (Signed)
[facility] DEPT Provider Note   CSN: [id number] Arrival date & time: 09/17/17  0610     History   Chief Complaint Chief Complaint  Patient presents with  . Abdominal Pain  . Emesis    HPI Brandi Gomez is a 46 y.o. female.  The history is provided by the patient.  Abdominal Pain   This is a new problem. The current episode started 3 to 5 hours ago. The problem occurs constantly. The problem has not changed since onset.The pain is associated with an unknown factor. The pain is located in the epigastric region. The quality of the pain is pressure-like. The pain is moderate. Pertinent negatives include fever, melena, nausea, constipation and dysuria. The symptoms are aggravated by palpation. Nothing relieves the symptoms.   46 year old female who presents with epigastric abdominal pain. She has a history of diabetes, seizure disorder, cognitive impairment, hypertension, and previous stroke. Reports that she woke up this morning with epigastric abdominal pressure. Denies any nausea, vomiting, melena or hematochezia. States that normally when she eats she does have diarrhea but this is a chronic issue. Denies any fever or chills. Denies any current chest pain, difficulty breathing, cough. No dysuria, urinary frequency, vaginal bleeding or discharge.   Past Medical History:  Diagnosis Date  . Anemia   . Anxiety   . Bipolar 1 disorder ([place])   . Common migraine 05/19/2015  . Depression   . Hypertension   . Mild mental retardation   . Obesity   . Partial complex seizure disorder with intractable epilepsy ([place]) 05/12/2014  . Seizures ([place])    intractable  . Sleep apnea   . Stroke ([place])   . Type II or unspecified type diabetes mellitus without mention of complication, not stated as uncontrolled     Patient Active Problem List   Diagnosis Date Noted  . History of recent stroke 08/06/2017  . Angioedema 06/03/2017  . OSA (obstructive sleep apnea)   . Cerebrovascular accident  (CVA) ([place]) 05/20/2017  . Controlled type 2 diabetes mellitus without complication, without long-term current use of insulin ([place]) 05/08/2017  . Essential hypertension 07/27/2008  . Convulsions ([place]) 02/05/2007    Past Surgical History:  Procedure Laterality Date  . COLONOSCOPY     2012-normal , Dr Sharlett Iles  . ESOPHAGOGASTRODUODENOSCOPY     normal-Dr Patterson 2012  . LOOP RECORDER INSERTION N/A 05/30/2017   Procedure: Loop Recorder Insertion;  Surgeon: Constance Haw, MD;  Location: [facility];  Service: Cardiovascular;  Laterality: N/A;  . MYRINGOTOMY WITH TUBE PLACEMENT    . NASAL SINUS SURGERY    . TEE WITHOUT CARDIOVERSION N/A 05/30/2017   Procedure: TRANSESOPHAGEAL ECHOCARDIOGRAM (TEE);  Surgeon: Acie Fredrickson Wonda Cheng, MD;  Location: [facility] ENDOSCOPY;  Service: Cardiovascular;  Laterality: N/A;    OB History    No data available       Home Medications    Prior to Admission medications   Medication Sig Start Date End Date Taking? Authorizing Provider  atorvastatin (LIPITOR) 10 MG tablet Take 1 tablet (10 mg total) by mouth daily at 6 PM. 08/06/17  Yes Sagardia, Ines Bloomer, MD  carvedilol (COREG) 3.125 MG tablet Take 1 tablet (3.125 mg total) by mouth 2 (two) times daily. 05/22/17 05/22/18 Yes Barton Dubois, MD  diclofenac (VOLTAREN) 75 MG EC tablet Take 75 mg by mouth daily.  05/08/17  Yes [provider]  dicyclomine (BENTYL) 10 MG capsule Take 1 capsule (10 mg total) by mouth 3 (three) times daily before  meals. 06/23/17  Yes Danis, Kirke Corin, MD  DULoxetine (CYMBALTA) 30 MG capsule Take 1 capsule (30 mg total) by mouth daily. 08/06/17 09/17/17 Yes Horald Pollen, MD  famotidine (PEPCID) 20 MG tablet TAKE 1 TABLET BY MOUTH TWICE A DAY 06/16/17  Yes Whiteheart, Cristal Ford, NP  ibuprofen (ADVIL,MOTRIN) 200 MG tablet Take 400 mg by mouth every 6 (six) hours as needed for headache or mild pain.   Yes [provider]  levETIRAcetam (KEPPRA) 500 MG tablet  TAKE 1 TABLET BY MOUTH TWICE A DAY Patient taking differently: taking one 500mg  tablet twice daily 05/20/17  Yes [name], MD  phenytoin (DILANTIN) 100 MG ER capsule TAKE 2 CAPSULES IN THE MORNING AND 3 CAPSULES IN THE EVENING Patient taking differently: Take 200 mg by mouth See admin instructions. TAKE 2 CAPSULES IN THE MORNING AND 2 CAPSULES IN THE EVENING 04/21/17  Yes [name], MD  sitaGLIPtin (JANUVIA) 50 MG tablet Take 1 tablet (50 mg total) by mouth daily. For control of blood sugar 05/08/17  Yes Sagardia, Ines Bloomer, MD  torsemide (DEMADEX) 20 MG tablet Take 1 tablet (20 mg total) by mouth daily. 05/08/17  Yes Sagardia, Ines Bloomer, MD  ACCU-CHEK SOFTCLIX LANCETS lancets Use as instructed 06/06/17   Horald Pollen, MD  alum & mag hydroxide-simeth (MYLANTA DOUBLE-STRENGTH) 272-536-64 MG/5ML suspension Take 10 mLs by mouth every 6 (six) hours as needed for indigestion. 09/17/17   Forde Dandy, MD  aspirin 325 MG tablet Take 1 tablet (325 mg total) by mouth daily. Patient not taking: Reported on 09/17/2017 05/23/17   Barton Dubois, MD  glucose blood test strip Use as instructed 06/06/17   Horald Pollen, MD  nitrofurantoin, macrocrystal-monohydrate, (MACROBID) 100 MG capsule Take 1 capsule (100 mg total) by mouth 2 (two) times daily. Patient not taking: Reported on 09/17/2017 09/01/17   Arturo Morton    Family History Family History  Problem Relation Age of Onset  . Diabetes Mother        passed away from accidental death  . Hypertension Mother   . Bipolar disorder Father   . Diabetes Daughter   . Leukemia Daughter   . Ovarian cancer Maternal Aunt     Social History Social History  Substance Use Topics  . Smoking status: Never Smoker  . Smokeless tobacco: Never Used  . Alcohol use No     Allergies   Amoxicillin; Lisinopril; and Hydrocodone   Review of Systems Review of Systems  Constitutional: Negative for fever.  Respiratory: Negative for  shortness of breath.   Cardiovascular: Negative for chest pain.  Gastrointestinal: Negative for constipation, melena and nausea.  Genitourinary: Negative for dysuria.  All other systems reviewed and are negative.    Physical Exam Updated Vital Signs BP 102/71   Pulse 63   Temp 98 F (36.7 C) (Oral)   Resp 12   Ht 5\' 1"  (1.549 m)   Wt 123.8 kg (273 lb)   LMP 08/18/2017   SpO2 96%   BMI 51.58 kg/m   Physical Exam Physical Exam  Nursing note and vitals reviewed. Constitutional: Well developed, well nourished, non-toxic, and in no acute distress Head: Normocephalic and atraumatic.  Mouth/Throat: Oropharynx is clear and moist.  Neck: Normal range of motion. Neck supple.  Cardiovascular: Normal rate and regular rhythm.   Pulmonary/Chest: Effort normal and breath sounds normal.  Abdominal: Soft. There is mild epigastric tenderness. Negative Murphy's sign. There is no rebound and no  guarding.  Musculoskeletal: Normal range of motion.  Neurological: Alert, no facial droop, fluent speech, moves all extremities symmetrically Skin: Skin is warm and dry.  Psychiatric: Cooperative   ED Treatments / Results  Labs (all labs ordered are listed, but only abnormal results are displayed) Labs Reviewed  LIPASE, BLOOD - Abnormal; Notable for the following:       Result Value   Lipase 53 (*)    All other components within normal limits  COMPREHENSIVE METABOLIC PANEL - Abnormal; Notable for the following:    Potassium 2.8 (*)    Chloride 89 (*)    CO2 34 (*)    Glucose, Bld 219 (*)    Creatinine, Ser 1.27 (*)    Calcium 8.6 (*)    Alkaline Phosphatase 161 (*)    GFR calc non Af Amer 49 (*)    GFR calc Af Amer 57 (*)    All other components within normal limits  CBC - Abnormal; Notable for the following:    RBC 3.83 (*)    Hemoglobin 11.7 (*)    HCT 35.7 (*)    All other components within normal limits  URINALYSIS, ROUTINE W REFLEX MICROSCOPIC - Abnormal; Notable for the  following:    Color, Urine AMBER (*)    APPearance CLOUDY (*)    Protein, ur 30 (*)    Bacteria, UA RARE (*)    Squamous Epithelial / LPF 6-30 (*)    All other components within normal limits  MAGNESIUM  POC URINE PREG, ED  I-STAT TROPONIN, ED    EKG  EKG Interpretation  Date/Time:  Wednesday September 17 2017 09:47:03 EDT Ventricular Rate:  65 PR Interval:    QRS Duration: 102 QT Interval:  382 QTC Calculation: 398 R Axis:   69 Text Interpretation:  Sinus rhythm Low voltage, precordial leads Borderline repolarization abnormality similar to previous EKG 08/23/2017 Confirmed by Brantley Stage [id number]) on 09/17/2017 9:50:18 AM       Radiology No results found.  Procedures Procedures (including critical care time)  Medications Ordered in ED Medications  gi cocktail (Maalox,Lidocaine,Donnatal) (30 mLs Oral Given 09/17/17 1034)  potassium chloride SA (K-DUR,KLOR-CON) CR tablet 80 mEq (80 mEq Oral Given 09/17/17 1034)  dicyclomine (BENTYL) capsule 10 mg (10 mg Oral Given 09/17/17 1034)     Initial Impression / Assessment and Plan / ED Course  I have reviewed the triage vital signs and the nursing notes.  Pertinent labs & imaging results that were available during my care of the patient were reviewed by me and considered in my medical decision making (see chart for details).     Well appearing, no acute distress. Vitals non-concerning. Abdomen soft and overall benign with focal epigastric mild tenderness. No murphy's sign to suggest gallbladder disease.  Blood work reviewed. Minimally elevated lipase, normal LFTs. She has hypokalemia, and given oral repletion. Has been taking daily ibuprofen recently, and question gastritis versus ulcer versus indigestion.  Given gi cocktail, bentyl. States symptoms improved. Will treat as indigestion. Patient to continue supportive care. Continue pepcid at home. Avoid NSAIDs. Strict return and follow-up instructions reviewed. She expressed  understanding of all discharge instructions and felt comfortable with the plan of care.   Final Clinical Impressions(s) / ED Diagnoses   Final diagnoses:  Epigastric pain    New Prescriptions New Prescriptions   ALUM & MAG HYDROXIDE-SIMETH (MYLANTA DOUBLE-STRENGTH) 425-956-38 MG/5ML SUSPENSION    Take 10 mLs by mouth every 6 (six) hours as needed for indigestion.  Forde Dandy, MD 09/17/17 [phone]

## 2017-09-17 NOTE — ED Notes (Signed)
Pt states she understands discharge instructions. Home stable with steady gait.

## 2017-09-17 NOTE — ED Triage Notes (Addendum)
Per EMS- pt was sleeping on her abd, woke up with abdominal pain with diarrhea. Denies nausea/vomiting. Has had this for 3 hours. No acute distress. Pt has hx of IBS. States "i always have diarrhea after I eat, I had ramen for dinner."

## 2017-09-18 ENCOUNTER — Encounter: Payer: Self-pay | Admitting: Adult Health

## 2017-09-18 ENCOUNTER — Ambulatory Visit (INDEPENDENT_AMBULATORY_CARE_PROVIDER_SITE_OTHER): Payer: Medicare HMO | Admitting: Adult Health

## 2017-09-18 VITALS — BP 118/83 | HR 75 | Wt 255.0 lb

## 2017-09-18 DIAGNOSIS — Z8673 Personal history of transient ischemic attack (TIA), and cerebral infarction without residual deficits: Secondary | ICD-10-CM | POA: Diagnosis not present

## 2017-09-18 DIAGNOSIS — R569 Unspecified convulsions: Secondary | ICD-10-CM

## 2017-09-18 NOTE — Progress Notes (Signed)
PATIENT: Brandi Gomez DOB: [birth year]  REASON FOR VISIT: follow up- right brain stroke, seizures HISTORY FROM: patient  HISTORY OF PRESENT ILLNESS: Today 09/18/17 Brandi Gomez is a 46 year old female with a history of right brain stroke, seizures and sleep apnea. She returns today for follow-up. The patient remains on aspirin for stroke prevention. Blood pressure is in normal range today. She continues regular follow-ups with her primary care provider. The patient did have 2 seizures. On October 9 she made Dr. Jannifer Franklin aware and he clarified that she should be taking Keppra 500 mg twice a day as well as Dilantin 200 mg in the morning and 300 mg in the evening. She reports that she does not operate a motor vehicle. She is able to complete all ADLs independently. She reports that the weakness she did have an left hand has resolved. She states today she feels very "weird in the head." She was in the hospital yesterday for chest pain-he was thought to be indigestion. Patient's blood work did show decreased potassium level she reports that she was given K+ supplements in the emergency room. She was also found to have a urinary tract infection and treated with Macrobid. She has not followed with her primary care provider. She denies any additional strokelike symptoms. She returns today for an evaluation.  HISTORY 06/26/17: Brandi Gomez is a 46 year old right-handed black female with a history of sleep apnea, seizures, and migraine headache. The patient was admitted to the hospital on 05/20/2017 with onset of left arm weakness. The patient was found to have a right brain stroke event that was cortical in nature, felt to be embolic. The patient has undergone a workup without delineation of the source of the stroke. The patient has had a carotid Doppler study, 2-D echocardiogram, transesophageal echocardiogram, MRA of the head, and she has had a loop recorder placed. The patient is on antiplatelet agents  currently. She is getting occupational and speech therapy, she has some residual weakness and clumsiness of the left hand. She denies any visual complaints or problems with swallowing. She has not had any falls. She returns for an evaluation.  REVIEW OF SYSTEMS: Out of a complete 14 system review of symptoms, the patient complains only of the following symptoms, and all other reviewed systems are negative.  Appetite change, light sensitivity, hearing loss, apnea, confusion, agitation, seizures, memory loss  ALLERGIES: Allergies  Allergen Reactions  . Amoxicillin Itching    Has patient had a PCN reaction causing immediate rash, facial/tongue/throat swelling, SOB or lightheadedness with hypotension: yes Has patient had a PCN reaction causing severe rash involving mucus membranes or skin necrosis: no Has patient had a PCN reaction that required hospitalization: no Has patient had a PCN reaction occurring within the last 10 years: yes If all of the above answers are "NO", then may proceed with Cephalosporin use.  Marland Kitchen Lisinopril Swelling    Angioedema   . Hydrocodone Other (See Comments)    Depressed     HOME MEDICATIONS: Outpatient Medications Prior to Visit  Medication Sig Dispense Refill  . ACCU-CHEK SOFTCLIX LANCETS lancets Use as instructed 100 each 12  . alum & mag hydroxide-simeth (MYLANTA DOUBLE-STRENGTH) 400-400-40 MG/5ML suspension Take 10 mLs by mouth every 6 (six) hours as needed for indigestion. 355 mL 0  . aspirin 325 MG tablet Take 1 tablet (325 mg total) by mouth daily. 30 tablet 1  . atorvastatin (LIPITOR) 10 MG tablet Take 1 tablet (10 mg total) by mouth daily at  6 PM. 30 tablet 3  . carvedilol (COREG) 3.125 MG tablet Take 1 tablet (3.125 mg total) by mouth 2 (two) times daily. 60 tablet 3  . diclofenac (VOLTAREN) 75 MG EC tablet Take 75 mg by mouth daily.   0  . dicyclomine (BENTYL) 10 MG capsule Take 1 capsule (10 mg total) by mouth 3 (three) times daily before meals. 90  capsule 1  . DULoxetine (CYMBALTA) 30 MG capsule Take 1 capsule (30 mg total) by mouth daily. 30 capsule 3  . famotidine (PEPCID) 20 MG tablet TAKE 1 TABLET BY MOUTH TWICE A DAY 15 tablet 0  . glucose blood test strip Use as instructed 100 each 12  . ibuprofen (ADVIL,MOTRIN) 200 MG tablet Take 400 mg by mouth every 6 (six) hours as needed for headache or mild pain.    Marland Kitchen levETIRAcetam (KEPPRA) 500 MG tablet TAKE 1 TABLET BY MOUTH TWICE A DAY (Patient taking differently: taking one 500mg  tablet twice daily) 60 tablet 3  . nitrofurantoin, macrocrystal-monohydrate, (MACROBID) 100 MG capsule Take 1 capsule (100 mg total) by mouth 2 (two) times daily. 10 capsule 0  . phenytoin (DILANTIN) 100 MG ER capsule TAKE 2 CAPSULES IN THE MORNING AND 3 CAPSULES IN THE EVENING (Patient taking differently: Take 200 mg by mouth See admin instructions. TAKE 2 CAPSULES IN THE MORNING AND 2 CAPSULES IN THE EVENING) 450 capsule 2  . sitaGLIPtin (JANUVIA) 50 MG tablet Take 1 tablet (50 mg total) by mouth daily. For control of blood sugar 90 tablet 1  . torsemide (DEMADEX) 20 MG tablet Take 1 tablet (20 mg total) by mouth daily. 90 tablet 1  . dicyclomine (BENTYL) 20 MG tablet Take 1 tablet (20 mg total) by mouth 2 (two) times daily as needed for spasms (abd pain). 20 tablet 0   No facility-administered medications prior to visit.     PAST MEDICAL HISTORY: Past Medical History:  Diagnosis Date  . Anemia   . Anxiety   . Bipolar 1 disorder ([place])   . Common migraine 05/19/2015  . Depression   . Hypertension   . Mild mental retardation   . Obesity   . Partial complex seizure disorder with intractable epilepsy ([place]) 05/12/2014  . Seizures ([place])    intractable, sz 08/23/17  . Sleep apnea   . Stroke ([place])   . Type II or unspecified type diabetes mellitus without mention of complication, not stated as uncontrolled     PAST SURGICAL HISTORY: Past Surgical History:  Procedure Laterality Date  . COLONOSCOPY      2012-normal , Dr Sharlett Iles  . ESOPHAGOGASTRODUODENOSCOPY     normal-Dr Patterson 2012  . LOOP RECORDER INSERTION N/A 05/30/2017   Procedure: Loop Recorder Insertion;  Surgeon: Constance Haw, MD;  Location: [facility];  Service: Cardiovascular;  Laterality: N/A;  . MYRINGOTOMY WITH TUBE PLACEMENT    . NASAL SINUS SURGERY    . TEE WITHOUT CARDIOVERSION N/A 05/30/2017   Procedure: TRANSESOPHAGEAL ECHOCARDIOGRAM (TEE);  Surgeon: Acie Fredrickson Wonda Cheng, MD;  Location: [facility] ENDOSCOPY;  Service: Cardiovascular;  Laterality: N/A;    FAMILY HISTORY: Family History  Problem Relation Age of Onset  . Diabetes Mother        passed away from accidental death  . Hypertension Mother   . Bipolar disorder Father   . Diabetes Daughter   . Leukemia Daughter   . Ovarian cancer Maternal Aunt     SOCIAL HISTORY: Social History   Social History  . Marital status:  Single    Spouse name: [name]  . Number of children: 3  . Years of education: 12   Occupational History  . disabled   .  Disabled   Social History Main Topics  . Smoking status: Never Smoker  . Smokeless tobacco: Never Used  . Alcohol use No  . Drug use: No  . Sexual activity: Not on file   Other Topics Concern  . Not on file   Social History Narrative   Patient lives at home with daughter.    Patient has 3 children.    Patient is right handed.    Patient has a high school education.    Patient is on disability   Patient drinks 2 cups of caffeine daily.      PHYSICAL EXAM  Vitals:   09/18/17 1041  BP: 118/83  Pulse: 75  Weight: 255 lb (115.7 kg)   Body mass index is 48.18 kg/m.  Generalized: Well developed, in no acute distress   Neurological examination  Mentation: Alert oriented to time, place, history taking. Follows all commands speech and language fluent Cranial nerve II-XII: Pupils were equal round reactive to light. Extraocular movements were full, visual field were full on confrontational test.  Facial sensation and strength were normal. Uvula tongue midline. Head turning and shoulder shrug  were normal and symmetric. Motor: The motor testing reveals 5 over 5 strength of all 4 extremities. Good symmetric motor tone is noted throughout.  Sensory: Sensory testing is intact to soft touch on all 4 extremities. No evidence of extinction is noted.  Coordination: Cerebellar testing reveals good finger-nose-finger and heel-to-shin bilaterally.  Gait and station: Gait is normal. Tandem gait is normal. Romberg is negative. No drift is seen.  Reflexes: Deep tendon reflexes are symmetric and normal bilaterally.   DIAGNOSTIC DATA (LABS, IMAGING, TESTING) - I reviewed patient records, labs, notes, testing and imaging myself where available.  Lab Results  Component Value Date   WBC 6.7 09/17/2017   HGB 11.7 (L) 09/17/2017   HCT 35.7 (L) 09/17/2017   MCV 93.2 09/17/2017   PLT 224 09/17/2017      Component Value Date/Time   NA 136 09/17/2017 0622   NA 139 11/11/2016 1148   K 2.8 (L) 09/17/2017 0622   CL 89 (L) 09/17/2017 0622   CO2 34 (H) 09/17/2017 0622   GLUCOSE 219 (H) 09/17/2017 0622   BUN 14 09/17/2017 0622   BUN 13 11/11/2016 1148   CREATININE 1.27 (H) 09/17/2017 0622   CREATININE 0.72 01/30/2015 1241   CALCIUM 8.6 (L) 09/17/2017 0622   PROT 7.6 09/17/2017 0622   PROT 7.6 11/11/2016 1148   ALBUMIN 3.5 09/17/2017 0622   ALBUMIN 4.0 11/11/2016 1148   AST 28 09/17/2017 0622   ALT 26 09/17/2017 0622   ALKPHOS 161 (H) 09/17/2017 0622   BILITOT 0.7 09/17/2017 0622   BILITOT <0.2 11/11/2016 1148   GFRNONAA 49 (L) 09/17/2017 0622   GFRNONAA >89 01/30/2015 1241   GFRAA 57 (L) 09/17/2017 0622   GFRAA >89 01/30/2015 1241   Lab Results  Component Value Date   CHOL 158 05/21/2017   HDL 44 05/21/2017   LDLCALC 82 05/21/2017   TRIG 161 (H) 05/21/2017   CHOLHDL 3.6 05/21/2017   Lab Results  Component Value Date   HGBA1C 7.4 (H) 05/21/2017   Lab Results  Component Value Date    VITAMINB12 379 05/13/2014   Lab Results  Component Value Date   TSH 1.070 03/14/2015  ASSESSMENT AND PLAN 46 y.o. year old female  has a past medical history of Anemia; Anxiety; Bipolar 1 disorder ([place]); Common migraine (05/19/2015); Depression; Hypertension; Mild mental retardation; Obesity; Partial complex seizure disorder with intractable epilepsy ([place]) (05/12/2014); Seizures ([place]); Sleep apnea; Stroke [facility] and Type II or unspecified type diabetes mellitus without mention of complication, not stated as uncontrolled. here with :  1. History of right brain stroke 2. Seizures  The patient will continue on aspirin for stroke prevention. She should maintain strict control of her blood pressure with goal less than 130/90, cholesterol LDL less than 70 and hemoglobin A1c less than 6.5%. The patient will remain on Keppra 500 mg twice a day as well as Dilantin 200 mg in the morning and 300 mg in the evening for seizure prevention. I have advised the patient that she should not operate a motor vehicle operate heavy machinery, perform activities at heights or participate in water sports until she is seizure-free for 6 months. She voiced understanding. Also advised patient that she should schedule a follow-up appointment with her primary care to follow up on her recent visit to the emergency room. She voiced understanding. Also advised that having a urinary tract infection can decrease her seizure threshold which may potentially cause a seizure. She should be diligent about taking her seizure medication- patient voiced understanding. She will follow-up in 6 months or sooner if needed.  I spent 25 minutes with the patient. 50% of this time was spent reviewing stroke precautions as well as seizure precautions.    Ward Givens, MSN, NP-C 09/18/2017, 11:01 AM [facility] [address] [place], [place] [address] [phone]

## 2017-09-18 NOTE — Progress Notes (Signed)
I have read the note, and I agree with the clinical assessment and plan.  Emiliano Welshans KEITH   

## 2017-09-18 NOTE — Patient Instructions (Signed)
Your Plan:  Continue Keppra 500 mg twice a day, Dilantin 200 mg in the AM, 300 mg in the PM Continue Aspirin  BP <130/90 Cholesterol LDL <70 HgbA1c <6.5 % If you develop any strokelike symptoms please call 911 Having an infection like UTI can lower your seizure threshold causes a seizure. Please take medication as directed. Do not skip any doses.  If your symptoms worsen or you develop new symptoms please let us know.   Thank you for coming to see [place] at [facility]. I hope we have been able to provide you high quality care today.  You may receive a patient satisfaction survey over the next few weeks. We would appreciate your feedback and comments so that we may continue to improve ourselves and the health of our patients.

## 2017-09-22 ENCOUNTER — Ambulatory Visit (HOSPITAL_COMMUNITY): Payer: Self-pay | Admitting: Psychiatry

## 2017-09-24 DIAGNOSIS — R1314 Dysphagia, pharyngoesophageal phase: Secondary | ICD-10-CM | POA: Diagnosis not present

## 2017-09-24 DIAGNOSIS — H722X1 Other marginal perforations of tympanic membrane, right ear: Secondary | ICD-10-CM | POA: Diagnosis not present

## 2017-09-24 DIAGNOSIS — K219 Gastro-esophageal reflux disease without esophagitis: Secondary | ICD-10-CM | POA: Diagnosis not present

## 2017-09-26 DIAGNOSIS — I10 Essential (primary) hypertension: Secondary | ICD-10-CM | POA: Diagnosis not present

## 2017-09-26 DIAGNOSIS — R41841 Cognitive communication deficit: Secondary | ICD-10-CM | POA: Diagnosis not present

## 2017-09-26 DIAGNOSIS — E119 Type 2 diabetes mellitus without complications: Secondary | ICD-10-CM | POA: Diagnosis not present

## 2017-09-26 DIAGNOSIS — I69354 Hemiplegia and hemiparesis following cerebral infarction affecting left non-dominant side: Secondary | ICD-10-CM | POA: Diagnosis not present

## 2017-09-26 DIAGNOSIS — F319 Bipolar disorder, unspecified: Secondary | ICD-10-CM | POA: Diagnosis not present

## 2017-09-26 DIAGNOSIS — Z7984 Long term (current) use of oral hypoglycemic drugs: Secondary | ICD-10-CM | POA: Diagnosis not present

## 2017-09-26 DIAGNOSIS — I69322 Dysarthria following cerebral infarction: Secondary | ICD-10-CM | POA: Diagnosis not present

## 2017-09-26 DIAGNOSIS — F71 Moderate intellectual disabilities: Secondary | ICD-10-CM | POA: Diagnosis not present

## 2017-09-29 ENCOUNTER — Ambulatory Visit (INDEPENDENT_AMBULATORY_CARE_PROVIDER_SITE_OTHER): Payer: Medicare HMO | Admitting: *Deleted

## 2017-09-29 DIAGNOSIS — I639 Cerebral infarction, unspecified: Secondary | ICD-10-CM | POA: Diagnosis not present

## 2017-09-29 NOTE — Progress Notes (Signed)
Carelink Summary Report / Loop Recorder 

## 2017-10-01 ENCOUNTER — Telehealth: Payer: Self-pay | Admitting: Cardiology

## 2017-10-01 ENCOUNTER — Other Ambulatory Visit: Payer: Self-pay | Admitting: Otolaryngology

## 2017-10-01 DIAGNOSIS — H903 Sensorineural hearing loss, bilateral: Secondary | ICD-10-CM | POA: Diagnosis not present

## 2017-10-01 DIAGNOSIS — H722X1 Other marginal perforations of tympanic membrane, right ear: Secondary | ICD-10-CM | POA: Diagnosis not present

## 2017-10-01 DIAGNOSIS — H6983 Other specified disorders of Eustachian tube, bilateral: Secondary | ICD-10-CM | POA: Diagnosis not present

## 2017-10-01 DIAGNOSIS — R1310 Dysphagia, unspecified: Secondary | ICD-10-CM

## 2017-10-01 DIAGNOSIS — R1314 Dysphagia, pharyngoesophageal phase: Secondary | ICD-10-CM | POA: Diagnosis not present

## 2017-10-01 DIAGNOSIS — H7292 Unspecified perforation of tympanic membrane, left ear: Secondary | ICD-10-CM | POA: Diagnosis not present

## 2017-10-01 LAB — CUP PACEART REMOTE DEVICE CHECK
Date Time Interrogation Session: 20181020153619
Implantable Pulse Generator Implant Date: 20180622

## 2017-10-01 NOTE — Telephone Encounter (Signed)
LMOVM requesting that pt send manual transmission b/c home monitor has not updated in at least 14 days.    

## 2017-10-02 ENCOUNTER — Other Ambulatory Visit: Payer: Self-pay | Admitting: Gastroenterology

## 2017-10-02 DIAGNOSIS — F319 Bipolar disorder, unspecified: Secondary | ICD-10-CM | POA: Diagnosis not present

## 2017-10-02 DIAGNOSIS — Z7984 Long term (current) use of oral hypoglycemic drugs: Secondary | ICD-10-CM | POA: Diagnosis not present

## 2017-10-02 DIAGNOSIS — I69354 Hemiplegia and hemiparesis following cerebral infarction affecting left non-dominant side: Secondary | ICD-10-CM | POA: Diagnosis not present

## 2017-10-02 DIAGNOSIS — E119 Type 2 diabetes mellitus without complications: Secondary | ICD-10-CM | POA: Diagnosis not present

## 2017-10-02 DIAGNOSIS — I69322 Dysarthria following cerebral infarction: Secondary | ICD-10-CM | POA: Diagnosis not present

## 2017-10-02 DIAGNOSIS — F71 Moderate intellectual disabilities: Secondary | ICD-10-CM | POA: Diagnosis not present

## 2017-10-02 DIAGNOSIS — I10 Essential (primary) hypertension: Secondary | ICD-10-CM | POA: Diagnosis not present

## 2017-10-02 DIAGNOSIS — R41841 Cognitive communication deficit: Secondary | ICD-10-CM | POA: Diagnosis not present

## 2017-10-02 NOTE — Telephone Encounter (Signed)
Refill request for Bentyl 10mg  3 times a day as needed. Last seen 06-2017.

## 2017-10-06 ENCOUNTER — Ambulatory Visit
Admission: RE | Admit: 2017-10-06 | Discharge: 2017-10-06 | Disposition: A | Discharge status: Home / Self Care | Payer: Medicare HMO | Source: Ambulatory Visit | Attending: Otolaryngology | Admitting: Otolaryngology

## 2017-10-06 DIAGNOSIS — R1310 Dysphagia, unspecified: Secondary | ICD-10-CM

## 2017-10-07 DIAGNOSIS — E119 Type 2 diabetes mellitus without complications: Secondary | ICD-10-CM | POA: Diagnosis not present

## 2017-10-07 DIAGNOSIS — F319 Bipolar disorder, unspecified: Secondary | ICD-10-CM | POA: Diagnosis not present

## 2017-10-07 DIAGNOSIS — I10 Essential (primary) hypertension: Secondary | ICD-10-CM | POA: Diagnosis not present

## 2017-10-07 DIAGNOSIS — R41841 Cognitive communication deficit: Secondary | ICD-10-CM | POA: Diagnosis not present

## 2017-10-07 DIAGNOSIS — I69322 Dysarthria following cerebral infarction: Secondary | ICD-10-CM | POA: Diagnosis not present

## 2017-10-07 DIAGNOSIS — Z7984 Long term (current) use of oral hypoglycemic drugs: Secondary | ICD-10-CM | POA: Diagnosis not present

## 2017-10-07 DIAGNOSIS — I69354 Hemiplegia and hemiparesis following cerebral infarction affecting left non-dominant side: Secondary | ICD-10-CM | POA: Diagnosis not present

## 2017-10-07 DIAGNOSIS — F71 Moderate intellectual disabilities: Secondary | ICD-10-CM | POA: Diagnosis not present

## 2017-10-08 ENCOUNTER — Encounter: Payer: Self-pay | Admitting: Emergency Medicine

## 2017-10-08 ENCOUNTER — Ambulatory Visit (INDEPENDENT_AMBULATORY_CARE_PROVIDER_SITE_OTHER): Payer: Medicare HMO | Admitting: Emergency Medicine

## 2017-10-08 VITALS — BP 110/70 | HR 84 | Temp 98.4°F | Resp 16 | Ht 60.0 in | Wt 252.6 lb

## 2017-10-08 DIAGNOSIS — R399 Unspecified symptoms and signs involving the genitourinary system: Secondary | ICD-10-CM | POA: Diagnosis not present

## 2017-10-08 DIAGNOSIS — N390 Urinary tract infection, site not specified: Secondary | ICD-10-CM

## 2017-10-08 DIAGNOSIS — R8290 Unspecified abnormal findings in urine: Secondary | ICD-10-CM | POA: Diagnosis not present

## 2017-10-08 DIAGNOSIS — R944 Abnormal results of kidney function studies: Secondary | ICD-10-CM | POA: Diagnosis not present

## 2017-10-08 DIAGNOSIS — K5900 Constipation, unspecified: Secondary | ICD-10-CM | POA: Insufficient documentation

## 2017-10-08 LAB — POCT CBC
GRANULOCYTE PERCENT: 63.7 % (ref 37–80)
HEMATOCRIT: 36.1 % — AB (ref 37.7–47.9)
HEMOGLOBIN: 12.2 g/dL (ref 12.2–16.2)
Lymph, poc: 3.7 — AB (ref 0.6–3.4)
MCH: 30.7 pg (ref 27–31.2)
MCHC: 33.9 g/dL (ref 31.8–35.4)
MCV: 90.5 fL (ref 80–97)
MID (cbc): 0.2 (ref 0–0.9)
MPV: 8.1 fL (ref 0–99.8)
POC GRANULOCYTE: 6.8 (ref 2–6.9)
POC LYMPH PERCENT: 34.2 %L (ref 10–50)
POC MID %: 2.1 % (ref 0–12)
Platelet Count, POC: 339 10*3/uL (ref 142–424)
RBC: 3.99 M/uL — AB (ref 4.04–5.48)
RDW, POC: 13.9 %
WBC: 10.7 10*3/uL — AB (ref 4.6–10.2)

## 2017-10-08 LAB — POC MICROSCOPIC URINALYSIS (UMFC): Mucus: ABSENT

## 2017-10-08 LAB — POCT URINALYSIS DIP (MANUAL ENTRY)
BILIRUBIN UA: NEGATIVE
Leukocytes, UA: NEGATIVE
NITRITE UA: NEGATIVE
Spec Grav, UA: 1.025 (ref 1.010–1.025)
Urobilinogen, UA: 0.2 E.U./dL
pH, UA: 5.5 (ref 5.0–8.0)

## 2017-10-08 LAB — GLUCOSE, POCT (MANUAL RESULT ENTRY): POC GLUCOSE: 159 mg/dL — AB (ref 70–99)

## 2017-10-08 MED ORDER — SULFAMETHOXAZOLE-TRIMETHOPRIM 800-160 MG PO TABS: 1.0000 | ORAL_TABLET | Freq: Two times a day (BID) | ORAL | 0 refills | Status: AC

## 2017-10-08 MED ORDER — POLYETHYLENE GLYCOL 3350 17 GM/SCOOP PO POWD: 17.0000 g | Freq: Every day | ORAL | 1 refills | Status: AC

## 2017-10-08 MED ORDER — DOCUSATE SODIUM 100 MG PO CAPS: 100.0000 mg | ORAL_CAPSULE | Freq: Two times a day (BID) | ORAL | 0 refills | Status: DC

## 2017-10-08 NOTE — Progress Notes (Signed)
Brandi Gomez 46 y.o.   Chief Complaint  Patient presents with  . Urinary Tract Infection    PROBLEMS with odor x 2 weeks  . Constipation    x 2 days    HISTORY OF PRESENT ILLNESS: This is a 45 y.o. female complaining of urinary symptoms x 2 weeks and constipation x 2 days. Denies fever, n/v, syncope, or any other significant symptoms.  Urinary Tract Infection   This is a new problem. The current episode started 1 to 4 weeks ago. The problem occurs every urination. The problem has been gradually worsening. The quality of the pain is described as burning. The pain is at a severity of 3/10. The pain is mild. There has been no fever. There is no history of pyelonephritis. Associated symptoms include frequency and urgency. Pertinent negatives include no chills, discharge, flank pain, hematuria, nausea or vomiting. She has tried nothing for the symptoms.     Prior to Admission medications   Medication Sig Start Date End Date Taking? Authorizing Provider  aspirin 325 MG tablet Take 1 tablet (325 mg total) by mouth daily. 05/23/17  Yes Barton Dubois, MD  atorvastatin (LIPITOR) 10 MG tablet Take 1 tablet (10 mg total) by mouth daily at 6 PM. 08/06/17  Yes Symiah Nowotny, Ines Bloomer, MD  carvedilol (COREG) 3.125 MG tablet Take 1 tablet (3.125 mg total) by mouth 2 (two) times daily. 05/22/17 05/22/18 Yes Barton Dubois, MD  diclofenac (VOLTAREN) 75 MG EC tablet Take 75 mg by mouth daily.  05/08/17  Yes [provider]  dicyclomine (BENTYL) 10 MG capsule TAKE 1 CAPSULE (10 MG TOTAL) BY MOUTH 3 (THREE) TIMES DAILY BEFORE MEALS. 10/02/17  Yes Danis, Kirke Corin, MD  ibuprofen (ADVIL,MOTRIN) 200 MG tablet Take 400 mg by mouth every 6 (six) hours as needed for headache or mild pain.   Yes [provider]  levETIRAcetam (KEPPRA) 500 MG tablet TAKE 1 TABLET BY MOUTH TWICE A DAY Patient taking differently: taking one 500mg  tablet twice daily 05/20/17  Yes [name], MD  phenytoin  (DILANTIN) 100 MG ER capsule TAKE 2 CAPSULES IN THE MORNING AND 3 CAPSULES IN THE EVENING Patient taking differently: Take 200 mg by mouth See admin instructions. TAKE 2 CAPSULES IN THE MORNING AND 2 CAPSULES IN THE EVENING 04/21/17  Yes [name], MD  sitaGLIPtin (JANUVIA) 50 MG tablet Take 1 tablet (50 mg total) by mouth daily. For control of blood sugar 05/08/17  Yes Korah Hufstedler, Ines Bloomer, MD  torsemide (DEMADEX) 20 MG tablet Take 1 tablet (20 mg total) by mouth daily. 05/08/17  Yes Obed Samek, Ines Bloomer, MD  ACCU-CHEK SOFTCLIX LANCETS lancets Use as instructed 06/06/17   Horald Pollen, MD  alum & mag hydroxide-simeth (MYLANTA DOUBLE-STRENGTH) 027-741-28 MG/5ML suspension Take 10 mLs by mouth every 6 (six) hours as needed for indigestion. Patient not taking: Reported on 10/08/2017 09/17/17   Forde Dandy, MD  DULoxetine (CYMBALTA) 30 MG capsule Take 1 capsule (30 mg total) by mouth daily. 08/06/17 09/18/17  Horald Pollen, MD  famotidine (PEPCID) 20 MG tablet TAKE 1 TABLET BY MOUTH TWICE A DAY Patient not taking: Reported on 10/08/2017 06/16/17   Marijean Heath, NP  glucose blood test strip Use as instructed 06/06/17   Horald Pollen, MD  nitrofurantoin, macrocrystal-monohydrate, (MACROBID) 100 MG capsule Take 1 capsule (100 mg total) by mouth 2 (two) times daily. Patient not taking: Reported on 10/08/2017 09/01/17   Ok Edwards, PA-C  Allergies  Allergen Reactions  . Amoxicillin Itching    Has patient had a PCN reaction causing immediate rash, facial/tongue/throat swelling, SOB or lightheadedness with hypotension: yes Has patient had a PCN reaction causing severe rash involving mucus membranes or skin necrosis: no Has patient had a PCN reaction that required hospitalization: no Has patient had a PCN reaction occurring within the last 10 years: yes If all of the above answers are "NO", then may proceed with Cephalosporin use.  Marland Kitchen Lisinopril Swelling     Angioedema   . Hydrocodone Other (See Comments)    Depressed     Patient Active Problem List   Diagnosis Date Noted  . History of recent stroke 08/06/2017  . Angioedema 06/03/2017  . OSA (obstructive sleep apnea)   . Cerebrovascular accident (CVA) ([place]) 05/20/2017  . Controlled type 2 diabetes mellitus without complication, without long-term current use of insulin ([place]) 05/08/2017  . Essential hypertension 07/27/2008  . Convulsions ([place]) 02/05/2007    Past Medical History:  Diagnosis Date  . Anemia   . Anxiety   . Bipolar 1 disorder ([place])   . Common migraine 05/19/2015  . Depression   . Hypertension   . Mild mental retardation   . Obesity   . Partial complex seizure disorder with intractable epilepsy ([place]) 05/12/2014  . Seizures ([place])    intractable, sz 08/23/17  . Sleep apnea   . Stroke ([place])   . Type II or unspecified type diabetes mellitus without mention of complication, not stated as uncontrolled     Past Surgical History:  Procedure Laterality Date  . COLONOSCOPY     2012-normal , Dr Sharlett Iles  . ESOPHAGOGASTRODUODENOSCOPY     normal-Dr Patterson 2012  . LOOP RECORDER INSERTION N/A 05/30/2017   Procedure: Loop Recorder Insertion;  Surgeon: Constance Haw, MD;  Location: [facility];  Service: Cardiovascular;  Laterality: N/A;  . MYRINGOTOMY WITH TUBE PLACEMENT    . NASAL SINUS SURGERY    . TEE WITHOUT CARDIOVERSION N/A 05/30/2017   Procedure: TRANSESOPHAGEAL ECHOCARDIOGRAM (TEE);  Surgeon: Acie Fredrickson Wonda Cheng, MD;  Location: [facility];  Service: Cardiovascular;  Laterality: N/A;    Social History   Social History  . Marital status: Single    Spouse name: Brandi Gomez  . Number of children: 3  . Years of education: 12   Occupational History  . disabled   .  Disabled   Social History Main Topics  . Smoking status: Never Smoker  . Smokeless tobacco: Never Used  . Alcohol use No  . Drug use: No  . Sexual activity: Not on file   Other Topics  Concern  . Not on file   Social History Narrative   Patient lives at home with daughter.    Patient has 3 children.    Patient is right handed.    Patient has a high school education.    Patient is on disability   Patient drinks 2 cups of caffeine daily.    Family History  Problem Relation Age of Onset  . Diabetes Mother        passed away from accidental death  . Hypertension Mother   . Bipolar disorder Father   . Diabetes Daughter   . Leukemia Daughter   . Ovarian cancer Maternal Aunt      Review of Systems  Constitutional: Negative for chills and fever.  HENT: Negative.   Eyes: Negative.   Respiratory: Negative.  Negative for cough and shortness of breath.  Cardiovascular: Negative for chest pain and palpitations.  Gastrointestinal: Positive for constipation. Negative for abdominal pain, nausea and vomiting.  Genitourinary: Positive for frequency and urgency. Negative for flank pain and hematuria.  Skin: Negative.  Negative for rash.  Neurological: Negative.  Negative for dizziness and headaches.  Endo/Heme/Allergies: Negative.   All other systems reviewed and are negative.   Vitals:   10/08/17 1551 10/08/17 1655  BP: 100/80 110/70  Pulse: (!) 115 84  Resp: 16   Temp: 98.4 F (36.9 C)   SpO2: 98%     Physical Exam  Constitutional: She is oriented to person, place, and time. She appears well-developed and well-nourished.  HENT:  Head: Normocephalic and atraumatic.  Eyes: Pupils are equal, round, and reactive to light. Conjunctivae are normal.  Neck: Normal range of motion. Neck supple.  Cardiovascular: Normal rate, regular rhythm and normal heart sounds.   Pulmonary/Chest: Effort normal and breath sounds normal.  Abdominal: Soft. Bowel sounds are normal. She exhibits no distension. There is no tenderness.  Musculoskeletal: Normal range of motion.  Neurological: She is alert and oriented to person, place, and time. No sensory deficit. She exhibits normal  muscle tone.  Skin: Skin is warm and dry. Capillary refill takes less than 2 seconds. No rash noted.  Psychiatric: She has a normal mood and affect. Her behavior is normal.  Vitals reviewed.    ASSESSMENT & PLAN: Elynn was seen today for urinary tract infection and constipation.  Diagnoses and all orders for this visit:  Acute UTI  Malodorous urine -     POCT Microscopic Urinalysis ([facility]) -     POCT urinalysis dipstick -     POCT CBC -     POCT glucose (manual entry) -     Comprehensive metabolic panel  Lower urinary tract symptoms (LUTS)  Constipation, unspecified constipation type  Other orders -     sulfamethoxazole-trimethoprim (BACTRIM DS,SEPTRA DS) 800-160 MG tablet; Take 1 tablet by mouth 2 (two) times daily. -     polyethylene glycol powder (GLYCOLAX/MIRALAX) powder; Take 17 g by mouth daily. -     docusate sodium (COLACE) 100 MG capsule; Take 1 capsule (100 mg total) by mouth 2 (two) times daily.      Patient Instructions   We recommend that you schedule a mammogram for breast cancer screening. Typically, you do not need a referral to do this. Please contact a local imaging center to schedule your mammogram.  [facility] - [phone]  *ask for the Radiology Department [facility] ([place]) - [phone] or [phone]  [facility] - [phone] [place] [phone] [facility] - [phone]  *ask for the [facility] - [phone]  *ask for the Radiology Department [facility] - [phone]  *ask for the [facility] - [phone]    IF you received an x-ray today, you will receive an invoice from [facility]. Please contact [facility] at [phone] with questions or concerns regarding your invoice.   IF you received labwork today, you will receive an invoice from [facility].  Please contact [facility] at [phone] with questions or concerns regarding your invoice.   Our billing staff will not be able to assist you with questions regarding bills from these companies.  You will be contacted with the lab results as soon as they are available. The fastest way to get your results is to activate your My  Chart account. Instructions are located on the last page of this paperwork. If you have not heard from [place] regarding the results in 2 weeks, please contact this office.     Urinary Tract Infection, Adult A urinary tract infection (UTI) is an infection of any part of the urinary tract. The urinary tract includes the:  Kidneys.  Ureters.  Bladder.  Urethra.  These organs make, store, and get rid of pee (urine) in the body. Follow these instructions at home:  Take over-the-counter and prescription medicines only as told by your doctor.  If you were prescribed an antibiotic medicine, take it as told by your doctor. Do not stop taking the antibiotic even if you start to feel better.  Avoid the following drinks: ? Alcohol. ? Caffeine. ? Tea. ? Carbonated drinks.  Drink enough fluid to keep your pee clear or pale yellow.  Keep all follow-up visits as told by your doctor. This is important.  Make sure to: ? Empty your bladder often and completely. Do not to hold pee for long periods of time. ? Empty your bladder before and after sex. ? Wipe from front to back after a bowel movement if you are female. Use each tissue one time when you wipe. Contact a doctor if:  You have back pain.  You have a fever.  You feel sick to your stomach (nauseous).  You throw up (vomit).  Your symptoms do not get better after 3 days.  Your symptoms go away and then come back. Get help right away if:  You have very bad back pain.  You have very bad lower belly (abdominal) pain.  You are throwing up and cannot keep down any medicines or water. This information is not  intended to replace advice given to you by your health care provider. Make sure you discuss any questions you have with your health care provider. Document Released: 05/13/2008 Document Revised: 05/02/2016 Document Reviewed: 10/16/2015 Elsevier Interactive Patient Education  2018 Elsevier Inc.     Agustina Caroli, MD [facility]

## 2017-10-08 NOTE — Patient Instructions (Addendum)
We recommend that you schedule a mammogram for breast cancer screening. Typically, you do not need a referral to do this. Please contact a local imaging center to schedule your mammogram.  [facility] - [phone]  *ask for the Radiology Department [facility] - [phone] or [phone]  [facility] - [phone] [place] [phone] [facility] - [phone]  *ask for the [facility] - [phone]  *ask for the Radiology Department [facility] - [phone]  *ask for the [facility] - [phone]    IF you received an x-ray today, you will receive an invoice from [facility]. Please contact [facility]iology at [phone] with questions or concerns regarding your invoice.   IF you received labwork today, you will receive an invoice from [facility]. Please contact [facility] at [phone] with questions or concerns regarding your invoice.   Our billing staff will not be able to assist you with questions regarding bills from these companies.  You will be contacted with the lab results as soon as they are available. The fastest way to get your results is to activate your My Chart account. Instructions are located on the last page of this paperwork. If you have not heard from [place] regarding the results in 2 weeks, please contact this office.     Urinary Tract Infection, Adult A urinary tract infection (UTI) is an infection of any part of the urinary tract. The urinary tract includes the:  Kidneys.  Ureters.  Bladder.  Urethra.  These organs make, store, and get rid of pee (urine) in the body. Follow these instructions at home:  Take over-the-counter and prescription medicines only as told by your doctor.  If you were prescribed an antibiotic medicine, take it as told by your doctor. Do not stop  taking the antibiotic even if you start to feel better.  Avoid the following drinks: ? Alcohol. ? Caffeine. ? Tea. ? Carbonated drinks.  Drink enough fluid to keep your pee clear or pale yellow.  Keep all follow-up visits as told by your doctor. This is important.  Make sure to: ? Empty your bladder often and completely. Do not to hold pee for long periods of time. ? Empty your bladder before and after sex. ? Wipe from front to back after a bowel movement if you are female. Use each tissue one time when you wipe. Contact a doctor if:  You have back pain.  You have a fever.  You feel sick to your stomach (nauseous).  You throw up (vomit).  Your symptoms do not get better after 3 days.  Your symptoms go away and then come back. Get help right away if:  You have very bad back pain.  You have very bad lower belly (abdominal) pain.  You are throwing up and cannot keep down any medicines or water. This information is not intended to replace advice given to you by your health care provider. Make sure you discuss any questions you have with your health care provider. Document Released: 05/13/2008 Document Revised: 05/02/2016 Document Reviewed: 10/16/2015 Elsevier Interactive Patient Education  [employer].

## 2017-10-09 ENCOUNTER — Emergency Department (HOSPITAL_COMMUNITY)
Admission: EM | Admit: 2017-10-09 | Discharge: 2017-10-10 | Disposition: A | Discharge status: Home / Self Care | Payer: Medicare HMO | Attending: Emergency Medicine | Admitting: Emergency Medicine

## 2017-10-09 ENCOUNTER — Encounter (HOSPITAL_COMMUNITY): Payer: Self-pay

## 2017-10-09 DIAGNOSIS — E119 Type 2 diabetes mellitus without complications: Secondary | ICD-10-CM | POA: Insufficient documentation

## 2017-10-09 DIAGNOSIS — F70 Mild intellectual disabilities: Secondary | ICD-10-CM | POA: Diagnosis not present

## 2017-10-09 DIAGNOSIS — K5900 Constipation, unspecified: Secondary | ICD-10-CM | POA: Diagnosis not present

## 2017-10-09 DIAGNOSIS — Z79899 Other long term (current) drug therapy: Secondary | ICD-10-CM | POA: Insufficient documentation

## 2017-10-09 DIAGNOSIS — I10 Essential (primary) hypertension: Secondary | ICD-10-CM | POA: Insufficient documentation

## 2017-10-09 DIAGNOSIS — R194 Change in bowel habit: Secondary | ICD-10-CM | POA: Insufficient documentation

## 2017-10-09 DIAGNOSIS — K6289 Other specified diseases of anus and rectum: Secondary | ICD-10-CM | POA: Diagnosis present

## 2017-10-09 DIAGNOSIS — Z7982 Long term (current) use of aspirin: Secondary | ICD-10-CM | POA: Diagnosis not present

## 2017-10-09 LAB — COMPREHENSIVE METABOLIC PANEL
A/G RATIO: 1.2 (ref 1.2–2.2)
ALK PHOS: 209 IU/L — AB (ref 39–117)
ALT: 24 IU/L (ref 0–32)
AST: 22 IU/L (ref 0–40)
Albumin: 4.4 g/dL (ref 3.5–5.5)
BUN/Creatinine Ratio: 14 (ref 9–23)
BUN: 24 mg/dL (ref 6–24)
CHLORIDE: 88 mmol/L — AB (ref 96–106)
CO2: 30 mmol/L — ABNORMAL HIGH (ref 20–29)
Calcium: 9.3 mg/dL (ref 8.7–10.2)
Creatinine, Ser: 1.67 mg/dL — ABNORMAL HIGH (ref 0.57–1.00)
GFR calc Af Amer: 41 mL/min/{1.73_m2} — ABNORMAL LOW (ref >59)
GFR, EST NON AFRICAN AMERICAN: 36 mL/min/{1.73_m2} — AB (ref >59)
GLOBULIN, TOTAL: 3.7 g/dL (ref 1.5–4.5)
Glucose: 149 mg/dL — ABNORMAL HIGH (ref 65–99)
POTASSIUM: 3.5 mmol/L (ref 3.5–5.2)
SODIUM: 137 mmol/L (ref 134–144)
Total Protein: 8.1 g/dL (ref 6.0–8.5)

## 2017-10-09 NOTE — ED Triage Notes (Signed)
Pt reports constipation and rectal pain. She states that this is the 3rd time in 3 weeks. She reports seeing her PCP yesterday and she was given medication, but she states that she cant wait for it to work. A&Ox4. Ambulatory. No vomiting or fever.

## 2017-10-10 ENCOUNTER — Encounter: Payer: Self-pay | Admitting: Cardiology

## 2017-10-10 ENCOUNTER — Encounter (HOSPITAL_COMMUNITY): Payer: Self-pay | Admitting: Emergency Medicine

## 2017-10-10 ENCOUNTER — Emergency Department (HOSPITAL_COMMUNITY): Payer: Medicare HMO

## 2017-10-10 ENCOUNTER — Emergency Department (HOSPITAL_COMMUNITY)
Admission: EM | Admit: 2017-10-10 | Discharge: 2017-10-10 | Disposition: A | Discharge status: Home / Self Care | Payer: Medicare HMO | Source: Home / Self Care | Attending: Emergency Medicine | Admitting: Emergency Medicine

## 2017-10-10 ENCOUNTER — Ambulatory Visit: Payer: Medicare HMO | Admitting: Emergency Medicine

## 2017-10-10 DIAGNOSIS — Z79899 Other long term (current) drug therapy: Secondary | ICD-10-CM | POA: Insufficient documentation

## 2017-10-10 DIAGNOSIS — I10 Essential (primary) hypertension: Secondary | ICD-10-CM | POA: Insufficient documentation

## 2017-10-10 DIAGNOSIS — E119 Type 2 diabetes mellitus without complications: Secondary | ICD-10-CM

## 2017-10-10 DIAGNOSIS — R194 Change in bowel habit: Secondary | ICD-10-CM | POA: Diagnosis not present

## 2017-10-10 DIAGNOSIS — K5900 Constipation, unspecified: Secondary | ICD-10-CM

## 2017-10-10 DIAGNOSIS — Z7982 Long term (current) use of aspirin: Secondary | ICD-10-CM

## 2017-10-10 MED ORDER — MAGNESIUM CITRATE PO SOLN
1.0000 | Freq: Once | ORAL | Status: AC
  Administered 2017-10-10: 1 via ORAL
  Filled 2017-10-10: qty 296

## 2017-10-10 MED ORDER — MILK AND MOLASSES ENEMA
1.0000 | Freq: Once | RECTAL | Status: AC
  Administered 2017-10-10: 250 mL via RECTAL
  Filled 2017-10-10: qty 250

## 2017-10-10 MED ORDER — MINERAL OIL RE ENEM: 1.0000 | ENEMA | Freq: Once | RECTAL | Status: DC

## 2017-10-10 NOTE — ED Notes (Signed)
ED Provider at bedside. 

## 2017-10-10 NOTE — ED Provider Notes (Signed)
[facility] DEPT Provider Note   CSN: [id number] Arrival date & time: 10/10/17  0800     History   Chief Complaint Chief Complaint  Patient presents with  . Abdominal Pain  . Constipation    HPI Brandi Gomez is a 46 y.o. female.  46 year old female presents with persistent constipation times several days.  Was seen here less than 24 hours ago for similar symptoms and given magnesium citrate and she took this at home which did not help her symptoms.  She denies any fever or abdominal discomfort.  No vomiting noted.  States that she has passed a small amount of stool.  Nothing makes her symptoms better.      Past Medical History:  Diagnosis Date  . Anemia   . Anxiety   . Bipolar 1 disorder ([place])   . Common migraine 05/19/2015  . Depression   . Hypertension   . Mild mental retardation   . Obesity   . Partial complex seizure disorder with intractable epilepsy ([place]) 05/12/2014  . Seizures ([place])    intractable, sz 08/23/17  . Sleep apnea   . Stroke ([place])   . Type II or unspecified type diabetes mellitus without mention of complication, not stated as uncontrolled     Patient Active Problem List   Diagnosis Date Noted  . Malodorous urine 10/08/2017  . Lower urinary tract symptoms (LUTS) 10/08/2017  . Acute UTI 10/08/2017  . Constipation 10/08/2017  . History of recent stroke 08/06/2017  . Angioedema 06/03/2017  . OSA (obstructive sleep apnea)   . Cerebrovascular accident (CVA) ([place]) 05/20/2017  . Controlled type 2 diabetes mellitus without complication, without long-term current use of insulin ([place]) 05/08/2017  . Essential hypertension 07/27/2008  . Convulsions ([place]) 02/05/2007    Past Surgical History:  Procedure Laterality Date  . COLONOSCOPY     2012-normal , Dr Sharlett Iles  . ESOPHAGOGASTRODUODENOSCOPY     normal-Dr Patterson 2012  . LOOP RECORDER INSERTION N/A 05/30/2017   Procedure: Loop Recorder Insertion;  Surgeon:  Constance Haw, MD;  Location: [facility];  Service: Cardiovascular;  Laterality: N/A;  . MYRINGOTOMY WITH TUBE PLACEMENT    . NASAL SINUS SURGERY    . TEE WITHOUT CARDIOVERSION N/A 05/30/2017   Procedure: TRANSESOPHAGEAL ECHOCARDIOGRAM (TEE);  Surgeon: Acie Fredrickson Wonda Cheng, MD;  Location: [facility] END[facility]COPY;  Service: Cardiovascular;  Laterality: N/A;    OB History    No data available       Home Medications    Prior to Admission medications   Medication Sig Start Date End Date Taking? Authorizing Provider  ACCU-CHEK SOFTCLIX LANCETS lancets Use as instructed 06/06/17  Yes Sagardia, Ines Bloomer, MD  aspirin 325 MG tablet Take 1 tablet (325 mg total) by mouth daily. 05/23/17  Yes Barton Dubois, MD  atorvastatin (LIPITOR) 10 MG tablet Take 1 tablet (10 mg total) by mouth daily at 6 PM. 08/06/17  Yes Sagardia, Ines Bloomer, MD  carvedilol (COREG) 3.125 MG tablet Take 1 tablet (3.125 mg total) by mouth 2 (two) times daily. 05/22/17 05/22/18 Yes Barton Dubois, MD  dicyclomine (BENTYL) 10 MG capsule TAKE 1 CAPSULE (10 MG TOTAL) BY MOUTH 3 (THREE) TIMES DAILY BEFORE MEALS. 10/02/17  Yes Danis, Kirke Corin, MD  docusate sodium (COLACE) 100 MG capsule Take 1 capsule (100 mg total) by mouth 2 (two) times daily. 10/08/17  Yes Sagardia, Ines Bloomer, MD  DULoxetine (CYMBALTA) 30 MG capsule Take 1 capsule (30 mg total) by mouth daily.  08/06/17 10/10/17 Yes Sagardia, Ines Bloomer, MD  glucose blood test strip Use as instructed 06/06/17  Yes Sagardia, Ines Bloomer, MD  ibuprofen (ADVIL,MOTRIN) 200 MG tablet Take 400 mg by mouth every 6 (six) hours as needed for headache or mild pain.   Yes [provider]  levETIRAcetam (KEPPRA) 500 MG tablet TAKE 1 TABLET BY MOUTH TWICE A DAY Patient taking differently: taking one 500mg  tablet twice daily 05/20/17  Yes [name], MD  magnesium citrate SOLN Take 1 Bottle by mouth once.   Yes [provider]  phenytoin (DILANTIN) 100 MG ER capsule  TAKE 2 CAPSULES IN THE MORNING AND 3 CAPSULES IN THE EVENING Patient taking differently: Take 200-300 mg by mouth See admin instructions. TAKE 2 CAPSULES IN THE MORNING AND 3 CAPSULES IN THE EVENING 04/21/17  Yes [name], MD  polyethylene glycol powder (GLYCOLAX/MIRALAX) powder Take 17 g by mouth daily. 10/08/17 10/13/17 Yes Sagardia, Ines Bloomer, MD  sitaGLIPtin (JANUVIA) 50 MG tablet Take 1 tablet (50 mg total) by mouth daily. For control of blood sugar 05/08/17  Yes Sagardia, Ines Bloomer, MD  sulfamethoxazole-trimethoprim (BACTRIM DS,SEPTRA DS) 800-160 MG tablet Take 1 tablet by mouth 2 (two) times daily. 10/08/17 10/15/17 Yes Sagardia, Ines Bloomer, MD  torsemide (DEMADEX) 20 MG tablet Take 1 tablet (20 mg total) by mouth daily. 05/08/17  Yes Sagardia, Ines Bloomer, MD  alum & mag hydroxide-simeth (MYLANTA DOUBLE-STRENGTH) 782-956-21 MG/5ML suspension Take 10 mLs by mouth every 6 (six) hours as needed for indigestion. Patient not taking: Reported on 10/08/2017 09/17/17   Forde Dandy, MD  famotidine (PEPCID) 20 MG tablet TAKE 1 TABLET BY MOUTH TWICE A DAY Patient not taking: Reported on 10/08/2017 06/16/17   Marijean Heath, NP  nitrofurantoin, macrocrystal-monohydrate, (MACROBID) 100 MG capsule Take 1 capsule (100 mg total) by mouth 2 (two) times daily. Patient not taking: Reported on 10/08/2017 09/01/17   Arturo Morton    Family History Family History  Problem Relation Age of Onset  . Diabetes Mother        passed away from accidental death  . Hypertension Mother   . Bipolar disorder Father   . Diabetes Daughter   . Leukemia Daughter   . Ovarian cancer Maternal Aunt     Social History Social History  Substance Use Topics  . Smoking status: Never Smoker  . Smokeless tobacco: Never Used  . Alcohol use No     Allergies   Amoxicillin; Lisinopril; and Hydrocodone   Review of Systems Review of Systems  All other systems reviewed and are negative.    Physical  Exam Updated Vital Signs BP (!) 139/91 (BP Location: Left Arm)   Pulse 97   Temp 98.2 F (36.8 C) (Oral)   Resp 20   Ht 1.549 m (5\' 1" )   Wt 113.4 kg (250 lb)   SpO2 94%   BMI 47.24 kg/m   Physical Exam  Constitutional: She is oriented to person, place, and time. She appears well-developed and well-nourished.  Non-toxic appearance. No distress.  HENT:  Head: Normocephalic and atraumatic.  Eyes: Pupils are equal, round, and reactive to light. Conjunctivae, EOM and lids are normal.  Neck: Normal range of motion. Neck supple. No tracheal deviation present. No thyroid mass present.  Cardiovascular: Normal rate, regular rhythm and normal heart sounds.  Exam reveals no gallop.   No murmur heard. Pulmonary/Chest: Effort normal and breath sounds normal. No stridor. No respiratory distress. She has no decreased breath  sounds. She has no wheezes. She has no rhonchi. She has no rales.  Abdominal: Soft. Normal appearance and bowel sounds are normal. She exhibits no distension. There is no tenderness. There is no rigidity, no rebound, no guarding and no CVA tenderness.  Musculoskeletal: Normal range of motion. She exhibits no edema or tenderness.  Neurological: She is alert and oriented to person, place, and time. She has normal strength. No cranial nerve deficit or sensory deficit. GCS eye subscore is 4. GCS verbal subscore is 5. GCS motor subscore is 6.  Skin: Skin is warm and dry. No abrasion and no rash noted.  Psychiatric: She has a normal mood and affect. Her speech is normal and behavior is normal.  Nursing note and vitals reviewed.    ED Treatments / Results  Labs (all labs ordered are listed, but only abnormal results are displayed) Labs Reviewed - No data to display  EKG  EKG Interpretation None       Radiology Dg Abd Acute W/chest  Result Date: 10/10/2017 CLINICAL DATA:  46 year old female with constipation. EXAM: DG ABDOMEN ACUTE W/ 1V CHEST COMPARISON:  Chest radiograph  dated 03/19/2017 FINDINGS: The lungs are clear. There is no pleural effusion or pneumothorax. Top-normal cardiac silhouette. A loop recorder device noted over the heart. No acute osseous pathology. There is no bowel dilatation or evidence of obstruction. Oral contrast from prior study is seen in the distal colon and rectum. No free air or radiopaque calculi. The osseous structures and soft tissues appear unremarkable. IMPRESSION: 1. No acute cardiopulmonary process. 2. No bowel dilatation obstruction or free air. Electronically Signed   By: Anner Crete M.D.   On: 10/10/2017 01:55    Procedures Procedures (including critical care time)  Medications Ordered in ED Medications  milk and molasses enema (not administered)     Initial Impression / Assessment and Plan / ED Course  I have reviewed the triage vital signs and the nursing notes.  Pertinent labs & imaging results that were available during my care of the patient were reviewed by me and considered in my medical decision making (see chart for details).    Patient given a fleets enema here and feels better.  Had good results with her bowel movement.  Stable for discharge  Final Clinical Impressions(s) / ED Diagnoses   Final diagnoses:  None    New Prescriptions New Prescriptions   No medications on file     Lacretia Leigh, MD 10/10/17 1046

## 2017-10-10 NOTE — Discharge Instructions (Signed)
Drink the magnesium citrate when you get home or you can wait til morning. This will likely make you have several bowel movements that may be loose/watery. Follow-up with your doctor if you continue having issues. Return here for any new/worsening symptoms.

## 2017-10-10 NOTE — ED Triage Notes (Signed)
Patient c/o abd pain and constipation. Reports that she was here 3am for same thing.  Patient last BM was 2 days ago.

## 2017-10-10 NOTE — Addendum Note (Signed)
Addended by: Davina Poke on: 10/10/2017 11:04 AM   Modules accepted: Orders

## 2017-10-10 NOTE — ED Notes (Signed)
Gave about half of enema before it came out. Pt aware of need to retain enema for a few minutes if possible. Bedside commode at bedside.

## 2017-10-10 NOTE — ED Provider Notes (Signed)
[facility] DEPT Provider Note   CSN: [id number] Arrival date & time: 10/09/17  2147     History   Chief Complaint Chief Complaint  Patient presents with  . Constipation  . Rectal Pain    HPI Brandi Gomez is a 46 y.o. female.  The history is provided by the patient and medical records.    46 year old F with hx of anemia, bipolar disorder, migraine headaches, depression, HTN, mild mental retardation, seizures, sleep apnea, prior stroke, DM2, presenting to the ED with constipation and rectal pain.  Patient reports over the past month she has had 3 different episodes of constipation.  States she has had to manually disimpact herself a few times which seems to help.  She did this this morning and was able to produce a small bowel movement, but feels like there is more that "needs to come out".  States she is having some rectal pain but she thinks it is from straining to have BM.  She saw her primary care doctor yesterday and was started on MiraLAX which she took this morning but states she does not feel is helping.  Reports she is here because she wants something that will "work faster".  She denies any vomiting or nausea.  Has been able to eat and drink well today without issue.  No fever or chills.  No rectal bleeding or blood in the stool.    Past Medical History:  Diagnosis Date  . Anemia   . Anxiety   . Bipolar 1 disorder ([place])   . Common migraine 05/19/2015  . Depression   . Hypertension   . Mild mental retardation   . Obesity   . Partial complex seizure disorder with intractable epilepsy ([place]) 05/12/2014  . Seizures ([place])    intractable, sz 08/23/17  . Sleep apnea   . Stroke ([place])   . Type II or unspecified type diabetes mellitus without mention of complication, not stated as uncontrolled     Patient Active Problem List   Diagnosis Date Noted  . Malodorous urine 10/08/2017  . Lower urinary tract symptoms (LUTS) 10/08/2017  . Acute UTI  10/08/2017  . Constipation 10/08/2017  . History of recent stroke 08/06/2017  . Angioedema 06/03/2017  . OSA (obstructive sleep apnea)   . Cerebrovascular accident (CVA) ([place]) 05/20/2017  . Controlled type 2 diabetes mellitus without complication, without long-term current use of insulin ([place]) 05/08/2017  . Essential hypertension 07/27/2008  . Convulsions ([place]) 02/05/2007    Past Surgical History:  Procedure Laterality Date  . COLONOSCOPY     2012-normal , Dr Sharlett Iles  . ESOPHAGOGASTRODUODENOSCOPY     normal-Dr Patterson 2012  . LOOP RECORDER INSERTION N/A 05/30/2017   Procedure: Loop Recorder Insertion;  Surgeon: Constance Haw, MD;  Location: [facility];  Service: Cardiovascular;  Laterality: N/A;  . MYRINGOTOMY WITH TUBE PLACEMENT    . NASAL SINUS SURGERY    . TEE WITHOUT CARDIOVERSION N/A 05/30/2017   Procedure: TRANSESOPHAGEAL ECHOCARDIOGRAM (TEE);  Surgeon: Acie Fredrickson Wonda Cheng, MD;  Location: [facility] ENDOSCOPY;  Service: Cardiovascular;  Laterality: N/A;    OB History    No data available       Home Medications    Prior to Admission medications   Medication Sig Start Date End Date Taking? Authorizing Provider  aspirin 325 MG tablet Take 1 tablet (325 mg total) by mouth daily. 05/23/17  Yes Barton Dubois, MD  atorvastatin (LIPITOR) 10 MG tablet Take 1 tablet (10 mg  total) by mouth daily at 6 PM. 08/06/17  Yes Sagardia, Ines Bloomer, MD  carvedilol (COREG) 3.125 MG tablet Take 1 tablet (3.125 mg total) by mouth 2 (two) times daily. 05/22/17 05/22/18 Yes Barton Dubois, MD  dicyclomine (BENTYL) 10 MG capsule TAKE 1 CAPSULE (10 MG TOTAL) BY MOUTH 3 (THREE) TIMES DAILY BEFORE MEALS. 10/02/17  Yes Danis, Kirke Corin, MD  docusate sodium (COLACE) 100 MG capsule Take 1 capsule (100 mg total) by mouth 2 (two) times daily. 10/08/17  Yes Sagardia, Ines Bloomer, MD  DULoxetine (CYMBALTA) 30 MG capsule Take 1 capsule (30 mg total) by mouth daily. 08/06/17 10/10/17 Yes Sagardia, Ines Bloomer, MD  ibuprofen (ADVIL,MOTRIN) 200 MG tablet Take 400 mg by mouth every 6 (six) hours as needed for headache or mild pain.   Yes [provider]  levETIRAcetam (KEPPRA) 500 MG tablet TAKE 1 TABLET BY MOUTH TWICE A DAY Patient taking differently: taking one 500mg  tablet twice daily 05/20/17  Yes [name], MD  phenytoin (DILANTIN) 100 MG ER capsule TAKE 2 CAPSULES IN THE MORNING AND 3 CAPSULES IN THE EVENING Patient taking differently: Take 200 mg by mouth See admin instructions. TAKE 2 CAPSULES IN THE MORNING AND 2 CAPSULES IN THE EVENING 04/21/17  Yes [name], MD  polyethylene glycol powder (GLYCOLAX/MIRALAX) powder Take 17 g by mouth daily. 10/08/17 10/13/17 Yes Sagardia, Ines Bloomer, MD  sitaGLIPtin (JANUVIA) 50 MG tablet Take 1 tablet (50 mg total) by mouth daily. For control of blood sugar 05/08/17  Yes Sagardia, Ines Bloomer, MD  sulfamethoxazole-trimethoprim (BACTRIM DS,SEPTRA DS) 800-160 MG tablet Take 1 tablet by mouth 2 (two) times daily. 10/08/17 10/15/17 Yes Sagardia, Ines Bloomer, MD  torsemide (DEMADEX) 20 MG tablet Take 1 tablet (20 mg total) by mouth daily. 05/08/17  Yes Sagardia, Ines Bloomer, MD  ACCU-CHEK SOFTCLIX LANCETS lancets Use as instructed 06/06/17   Horald Pollen, MD  alum & mag hydroxide-simeth (MYLANTA DOUBLE-STRENGTH) 732-202-54 MG/5ML suspension Take 10 mLs by mouth every 6 (six) hours as needed for indigestion. Patient not taking: Reported on 10/08/2017 09/17/17   Forde Dandy, MD  famotidine (PEPCID) 20 MG tablet TAKE 1 TABLET BY MOUTH TWICE A DAY Patient not taking: Reported on 10/08/2017 06/16/17   Marijean Heath, NP  glucose blood test strip Use as instructed 06/06/17   Horald Pollen, MD  nitrofurantoin, macrocrystal-monohydrate, (MACROBID) 100 MG capsule Take 1 capsule (100 mg total) by mouth 2 (two) times daily. Patient not taking: Reported on 10/08/2017 09/01/17   Arturo Morton    Family History Family History    Problem Relation Age of Onset  . Diabetes Mother        passed away from accidental death  . Hypertension Mother   . Bipolar disorder Father   . Diabetes Daughter   . Leukemia Daughter   . Ovarian cancer Maternal Aunt     Social History Social History  Substance Use Topics  . Smoking status: Never Smoker  . Smokeless tobacco: Never Used  . Alcohol use No     Allergies   Amoxicillin; Lisinopril; and Hydrocodone   Review of Systems Review of Systems  Gastrointestinal: Positive for constipation.  All other systems reviewed and are negative.    Physical Exam Updated Vital Signs BP 131/78 (BP Location: Left Arm)   Pulse 90   Temp 98.6 F (37 C) (Oral)   Resp 18   Ht 5\' 1"  (1.549 m)   Wt 113.4 kg (  250 lb)   SpO2 97%   BMI 47.24 kg/m   Physical Exam  Constitutional: She is oriented to person, place, and time. She appears well-developed and well-nourished.  HENT:  Head: Normocephalic and atraumatic.  Mouth/Throat: Oropharynx is clear and moist.  Eyes: Pupils are equal, round, and reactive to light. Conjunctivae and EOM are normal.  Neck: Normal range of motion.  Cardiovascular: Normal rate, regular rhythm and normal heart sounds.   Pulmonary/Chest: Effort normal and breath sounds normal.  Abdominal: Soft. Bowel sounds are normal. There is no tenderness. There is no rigidity and no guarding.  Abdomen soft, benign, normal bowel sounds  Genitourinary:  Genitourinary Comments: Rectum with small amount of loose, brown stool surrounding; there is a small hemorrhoid at the 12 o'clock position that is not bleeding, thrombosed, and is non-tender  Musculoskeletal: Normal range of motion.  Neurological: She is alert and oriented to person, place, and time.  Skin: Skin is warm and dry.  Psychiatric: She has a normal mood and affect.  Nursing note and vitals reviewed.    ED Treatments / Results  Labs (all labs ordered are listed, but only abnormal results are  displayed) Labs Reviewed - No data to display  EKG  EKG Interpretation None       Radiology Dg Abd Acute W/chest  Result Date: 10/10/2017 CLINICAL DATA:  46 year old female with constipation. EXAM: DG ABDOMEN ACUTE W/ 1V CHEST COMPARISON:  Chest radiograph dated 03/19/2017 FINDINGS: The lungs are clear. There is no pleural effusion or pneumothorax. Top-normal cardiac silhouette. A loop recorder device noted over the heart. No acute osseous pathology. There is no bowel dilatation or evidence of obstruction. Oral contrast from prior study is seen in the distal colon and rectum. No free air or radiopaque calculi. The osseous structures and soft tissues appear unremarkable. IMPRESSION: 1. No acute cardiopulmonary process. 2. No bowel dilatation obstruction or free air. Electronically Signed   By: Anner Crete M.D.   On: 10/10/2017 01:55    Procedures Procedures (including critical care time)  Medications Ordered in ED Medications - No data to display   Initial Impression / Assessment and Plan / ED Course  I have reviewed the triage vital signs and the nursing notes.  Pertinent labs & imaging results that were available during my care of the patient were reviewed by me and considered in my medical decision making (see chart for details).  46 year old female here with complaints of constipation and rectal pain.  Reports for the past month she has had 3 different episodes of constipation.  She has had to manually does impact herself.  She did this this morning and only able to produce small bowel movement.  Reports she has been trying all day.  She was started on stool softener yesterday by her PCP but states she needs something to "work quicker".  Her abdomen is soft and benign.  She has not had any nausea or vomiting, has been eating and drinking normally.  She has normal bowel sounds here.  Abdominal films without obstructive process, she has oral contrast from barium swallow a few days  ago in the distal colon and rectum.  I have very low suspicion for acute obstructive process at this time.  Discussed with patient that she can take magnesium citrate to try and help speed things up.  She was given bottle here but will wait and take at home as to not have an accident in the car, feel this is reasonable.  Encouraged  to increase water intake.  Will have her follow-up closely with PCP.  Discussed plan with patient, she acknowledged understanding and agreed with plan of care.  Return precautions given for new or worsening symptoms.  Final Clinical Impressions(s) / ED Diagnoses   Final diagnoses:  Change in bowel habits    New Prescriptions New Prescriptions   No medications on file     Kathryne Hitch 79/98/72 1587    Delora Fuel, MD 27/61/84 2237

## 2017-10-13 ENCOUNTER — Telehealth: Payer: Self-pay | Admitting: Emergency Medicine

## 2017-10-13 NOTE — Telephone Encounter (Signed)
We need to call patient and schedule OV (she missed last week's appointment) for this week. Thanks.

## 2017-10-13 NOTE — Telephone Encounter (Signed)
[facility] to let them know patient's referral sent on 10/10/17 was urgent and to check status. They said our office needs to advise patient to stop taking voltera, advil, ibuprofen, and any type of inflammation medications. They said this could be affecting renal function. They also said we need to recheck renal function and if the patient still needs to be seen by their office we can call them. Please advise.

## 2017-10-14 NOTE — Telephone Encounter (Signed)
Please see MD note.  Pt needs OV.

## 2017-10-15 DIAGNOSIS — F71 Moderate intellectual disabilities: Secondary | ICD-10-CM | POA: Diagnosis not present

## 2017-10-15 DIAGNOSIS — I10 Essential (primary) hypertension: Secondary | ICD-10-CM | POA: Diagnosis not present

## 2017-10-15 DIAGNOSIS — R41841 Cognitive communication deficit: Secondary | ICD-10-CM | POA: Diagnosis not present

## 2017-10-15 DIAGNOSIS — Z7984 Long term (current) use of oral hypoglycemic drugs: Secondary | ICD-10-CM | POA: Diagnosis not present

## 2017-10-15 DIAGNOSIS — I69354 Hemiplegia and hemiparesis following cerebral infarction affecting left non-dominant side: Secondary | ICD-10-CM | POA: Diagnosis not present

## 2017-10-15 DIAGNOSIS — E119 Type 2 diabetes mellitus without complications: Secondary | ICD-10-CM | POA: Diagnosis not present

## 2017-10-15 DIAGNOSIS — I69322 Dysarthria following cerebral infarction: Secondary | ICD-10-CM | POA: Diagnosis not present

## 2017-10-15 DIAGNOSIS — F319 Bipolar disorder, unspecified: Secondary | ICD-10-CM | POA: Diagnosis not present

## 2017-10-16 ENCOUNTER — Telehealth: Payer: Self-pay | Admitting: *Deleted

## 2017-10-16 NOTE — Telephone Encounter (Signed)
Faxed signed POC back to [facility] at [phone]. Received fax confirmation.

## 2017-10-17 ENCOUNTER — Telehealth: Payer: Self-pay | Admitting: Emergency Medicine

## 2017-10-17 NOTE — Telephone Encounter (Signed)
Copied from [place]. Topic: Quick Communication - See Telephone Encounter >> Oct 17, 2017 10:45 AM Robina Ade, Helene Kelp D wrote: CRM for notification. See Telephone encounter for: 10/17/17. Emogene Morgan from [facility] in [place] called and said that they need all of patients medical records sent today because patient is having surgery on Tuesday. Her direct number is [phone] ext. [phone] and her fax number is [phone].

## 2017-10-20 ENCOUNTER — Telehealth: Payer: Self-pay | Admitting: *Deleted

## 2017-10-20 NOTE — Telephone Encounter (Signed)
Faxed signed order re: "patient requesting to be discharged the week of 10-27-17. Pt feels she is independent with medication" back to [facility] at [phone]. Received fax confirmation.

## 2017-10-20 NOTE — Telephone Encounter (Signed)
Left message for Brandi Gomez to call back and clarify if needing full medical records or most recent ov. Spoke with [name] and she went ahead and sent records. Thanks.

## 2017-10-20 NOTE — Telephone Encounter (Signed)
Please advise 

## 2017-10-22 DIAGNOSIS — I69354 Hemiplegia and hemiparesis following cerebral infarction affecting left non-dominant side: Secondary | ICD-10-CM | POA: Diagnosis not present

## 2017-10-22 DIAGNOSIS — I10 Essential (primary) hypertension: Secondary | ICD-10-CM | POA: Diagnosis not present

## 2017-10-22 DIAGNOSIS — E119 Type 2 diabetes mellitus without complications: Secondary | ICD-10-CM | POA: Diagnosis not present

## 2017-10-22 DIAGNOSIS — F319 Bipolar disorder, unspecified: Secondary | ICD-10-CM | POA: Diagnosis not present

## 2017-10-22 DIAGNOSIS — Z7984 Long term (current) use of oral hypoglycemic drugs: Secondary | ICD-10-CM | POA: Diagnosis not present

## 2017-10-22 DIAGNOSIS — I69322 Dysarthria following cerebral infarction: Secondary | ICD-10-CM | POA: Diagnosis not present

## 2017-10-22 DIAGNOSIS — R41841 Cognitive communication deficit: Secondary | ICD-10-CM | POA: Diagnosis not present

## 2017-10-22 DIAGNOSIS — F71 Moderate intellectual disabilities: Secondary | ICD-10-CM | POA: Diagnosis not present

## 2017-10-23 ENCOUNTER — Encounter: Payer: Self-pay | Admitting: Cardiology

## 2017-10-27 ENCOUNTER — Encounter: Payer: Medicare HMO | Admitting: *Deleted

## 2017-11-01 DIAGNOSIS — R41841 Cognitive communication deficit: Secondary | ICD-10-CM | POA: Diagnosis not present

## 2017-11-01 DIAGNOSIS — F71 Moderate intellectual disabilities: Secondary | ICD-10-CM | POA: Diagnosis not present

## 2017-11-01 DIAGNOSIS — I69354 Hemiplegia and hemiparesis following cerebral infarction affecting left non-dominant side: Secondary | ICD-10-CM | POA: Diagnosis not present

## 2017-11-01 DIAGNOSIS — I10 Essential (primary) hypertension: Secondary | ICD-10-CM | POA: Diagnosis not present

## 2017-11-01 DIAGNOSIS — E119 Type 2 diabetes mellitus without complications: Secondary | ICD-10-CM | POA: Diagnosis not present

## 2017-11-01 DIAGNOSIS — F319 Bipolar disorder, unspecified: Secondary | ICD-10-CM | POA: Diagnosis not present

## 2017-11-01 DIAGNOSIS — Z7984 Long term (current) use of oral hypoglycemic drugs: Secondary | ICD-10-CM | POA: Diagnosis not present

## 2017-11-01 DIAGNOSIS — I69322 Dysarthria following cerebral infarction: Secondary | ICD-10-CM | POA: Diagnosis not present

## 2017-11-03 ENCOUNTER — Other Ambulatory Visit: Payer: Self-pay | Admitting: *Deleted

## 2017-11-03 ENCOUNTER — Other Ambulatory Visit: Payer: Self-pay | Admitting: Otolaryngology

## 2017-11-03 ENCOUNTER — Encounter (HOSPITAL_COMMUNITY): Payer: Self-pay | Admitting: *Deleted

## 2017-11-03 MED ORDER — LEVETIRACETAM 500 MG PO TABS: 500.0000 mg | ORAL_TABLET | Freq: Two times a day (BID) | ORAL | 1 refills | Status: DC

## 2017-11-03 NOTE — Progress Notes (Signed)
Patient denies chest pain, shortness of breath, cardiologist. Patient given the below instructions regarding managing DM.    How do I manage my blood sugar before surgery? . Check your blood sugar at least 4 times a day, starting 2 days before surgery, to make sure that the level is not too high or low. o Check your blood sugar the morning of your surgery when you wake up and every 2 hours until you get to the Short Stay unit. . If your blood sugar is less than 70 mg/dL, you will need to treat for low blood sugar: o Do not take insulin. o Treat a low blood sugar (less than 70 mg/dL) with  cup of clear juice (cranberry or apple), 4 glucose tablets, OR glucose gel. Recheck blood sugar in 15 minutes after treatment (to make sure it is greater than 70 mg/dL). If your blood sugar is not greater than 70 mg/dL on recheck, call [phone] o  for further instructions. . Report your blood sugar to the short stay nurse when you get to Short Stay.  . If you are admitted to the hospital after surgery: o Your blood sugar will be checked by the staff and you will probably be given insulin after surgery (instead of oral diabetes medicines) to make sure you have good blood sugar levels. o The goal for blood sugar control after surgery is 80-180 mg/dL.

## 2017-11-04 ENCOUNTER — Telehealth: Payer: Self-pay

## 2017-11-04 ENCOUNTER — Telehealth: Payer: Self-pay | Admitting: *Deleted

## 2017-11-04 MED ORDER — DICYCLOMINE HCL 10 MG PO CAPS: 10.0000 mg | ORAL_CAPSULE | Freq: Three times a day (TID) | ORAL | 1 refills | Status: DC

## 2017-11-04 NOTE — Telephone Encounter (Signed)
Incoming fax from [facility]. Dicyclomine 10 mg caps 1 po TID before meals. #270 ( 90 day supply). Last seen 06-2017. Please advise on refills.

## 2017-11-04 NOTE — Telephone Encounter (Signed)
Faxed Rx Duloxetine 30 mg with 3 refills to [facility] on [address], per Dr Mitchel Honour. Confirmation page received at 10:01 am.

## 2017-11-04 NOTE — Anesthesia Preprocedure Evaluation (Addendum)
Anesthesia Evaluation  Patient identified by MRN, date of birth, ID band Patient awake    Reviewed: Allergy & Precautions, H&P , NPO status , Patient's Chart, lab work & pertinent test results, reviewed documented beta blocker date and time   Airway Mallampati: II  TM Distance: >3 FB Neck ROM: full    Dental no notable dental hx. (+) Teeth Intact, Dental Advisory Given   Pulmonary sleep apnea and Continuous Positive Airway Pressure Ventilation ,    Pulmonary exam normal breath sounds clear to auscultation       Cardiovascular hypertension, Pt. on medications and Pt. on home beta blockers  Rhythm:regular Rate:Normal     Neuro/Psych Seizures -, Poorly Controlled,  Anxiety Depression Bipolar Disorder Mild mental retardation CVA, No Residual Symptoms    GI/Hepatic negative GI ROS, Neg liver ROS, neg GERD  ,  Endo/Other  diabetes, Type 2  Renal/GU negative Renal ROS  negative genitourinary   Musculoskeletal negative musculoskeletal ROS (+)   Abdominal (+) + obese,   Peds  Hematology  (+) anemia ,   Anesthesia Other Findings   Reproductive/Obstetrics                            Anesthesia Physical  Anesthesia Plan  ASA: III  Anesthesia Plan: General   Post-op Pain Management:    Induction: Intravenous  PONV Risk Score and Plan: Treatment may vary due to age or medical condition and Ondansetron  Airway Management Planned: Mask and Natural Airway  Additional Equipment: None  Intra-op Plan:   Post-operative Plan:   Informed Consent: I have reviewed the patients History and Physical, chart, labs and discussed the procedure including the risks, benefits and alternatives for the proposed anesthesia with the patient or authorized representative who has indicated his/her understanding and acceptance.   Dental Advisory Given  Plan Discussed with: CRNA  Anesthesia Plan Comments:          Anesthesia Quick Evaluation

## 2017-11-04 NOTE — Telephone Encounter (Signed)
done

## 2017-11-05 ENCOUNTER — Other Ambulatory Visit: Payer: Self-pay | Admitting: Emergency Medicine

## 2017-11-05 ENCOUNTER — Ambulatory Visit (HOSPITAL_COMMUNITY)
Admission: RE | Admit: 2017-11-05 | Discharge: 2017-11-05 | Disposition: A | Discharge status: Home / Self Care | Payer: Medicare HMO | Source: Ambulatory Visit | Attending: Otolaryngology | Admitting: Otolaryngology

## 2017-11-05 ENCOUNTER — Encounter (HOSPITAL_COMMUNITY): Payer: Self-pay

## 2017-11-05 ENCOUNTER — Ambulatory Visit (HOSPITAL_COMMUNITY): Payer: Medicare HMO | Admitting: Anesthesiology

## 2017-11-05 ENCOUNTER — Encounter (HOSPITAL_COMMUNITY)
Admission: RE | Disposition: A | Discharge status: Home / Self Care | Payer: Self-pay | Source: Ambulatory Visit | Attending: Otolaryngology

## 2017-11-05 ENCOUNTER — Other Ambulatory Visit: Payer: Self-pay

## 2017-11-05 DIAGNOSIS — G4730 Sleep apnea, unspecified: Secondary | ICD-10-CM | POA: Diagnosis not present

## 2017-11-05 DIAGNOSIS — Z8041 Family history of malignant neoplasm of ovary: Secondary | ICD-10-CM | POA: Insufficient documentation

## 2017-11-05 DIAGNOSIS — H73891 Other specified disorders of tympanic membrane, right ear: Secondary | ICD-10-CM | POA: Insufficient documentation

## 2017-11-05 DIAGNOSIS — Z888 Allergy status to other drugs, medicaments and biological substances status: Secondary | ICD-10-CM | POA: Diagnosis not present

## 2017-11-05 DIAGNOSIS — G4733 Obstructive sleep apnea (adult) (pediatric): Secondary | ICD-10-CM | POA: Diagnosis not present

## 2017-11-05 DIAGNOSIS — K589 Irritable bowel syndrome without diarrhea: Secondary | ICD-10-CM | POA: Diagnosis not present

## 2017-11-05 DIAGNOSIS — Z8673 Personal history of transient ischemic attack (TIA), and cerebral infarction without residual deficits: Secondary | ICD-10-CM | POA: Insufficient documentation

## 2017-11-05 DIAGNOSIS — E669 Obesity, unspecified: Secondary | ICD-10-CM | POA: Diagnosis not present

## 2017-11-05 DIAGNOSIS — Z7982 Long term (current) use of aspirin: Secondary | ICD-10-CM | POA: Diagnosis not present

## 2017-11-05 DIAGNOSIS — Z880 Allergy status to penicillin: Secondary | ICD-10-CM | POA: Insufficient documentation

## 2017-11-05 DIAGNOSIS — F419 Anxiety disorder, unspecified: Secondary | ICD-10-CM | POA: Insufficient documentation

## 2017-11-05 DIAGNOSIS — Z9889 Other specified postprocedural states: Secondary | ICD-10-CM | POA: Diagnosis not present

## 2017-11-05 DIAGNOSIS — Z885 Allergy status to narcotic agent status: Secondary | ICD-10-CM | POA: Insufficient documentation

## 2017-11-05 DIAGNOSIS — R569 Unspecified convulsions: Secondary | ICD-10-CM | POA: Insufficient documentation

## 2017-11-05 DIAGNOSIS — Z7984 Long term (current) use of oral hypoglycemic drugs: Secondary | ICD-10-CM | POA: Diagnosis not present

## 2017-11-05 DIAGNOSIS — Z6841 Body Mass Index (BMI) 40.0 and over, adult: Secondary | ICD-10-CM | POA: Insufficient documentation

## 2017-11-05 DIAGNOSIS — Z8249 Family history of ischemic heart disease and other diseases of the circulatory system: Secondary | ICD-10-CM | POA: Diagnosis not present

## 2017-11-05 DIAGNOSIS — F70 Mild intellectual disabilities: Secondary | ICD-10-CM | POA: Diagnosis not present

## 2017-11-05 DIAGNOSIS — Z79899 Other long term (current) drug therapy: Secondary | ICD-10-CM | POA: Insufficient documentation

## 2017-11-05 DIAGNOSIS — H6981 Other specified disorders of Eustachian tube, right ear: Secondary | ICD-10-CM | POA: Diagnosis not present

## 2017-11-05 DIAGNOSIS — I10 Essential (primary) hypertension: Secondary | ICD-10-CM | POA: Diagnosis not present

## 2017-11-05 DIAGNOSIS — H7292 Unspecified perforation of tympanic membrane, left ear: Secondary | ICD-10-CM | POA: Insufficient documentation

## 2017-11-05 DIAGNOSIS — Z833 Family history of diabetes mellitus: Secondary | ICD-10-CM | POA: Insufficient documentation

## 2017-11-05 DIAGNOSIS — F319 Bipolar disorder, unspecified: Secondary | ICD-10-CM | POA: Diagnosis not present

## 2017-11-05 DIAGNOSIS — Z818 Family history of other mental and behavioral disorders: Secondary | ICD-10-CM | POA: Diagnosis not present

## 2017-11-05 DIAGNOSIS — H6991 Unspecified Eustachian tube disorder, right ear: Secondary | ICD-10-CM | POA: Diagnosis not present

## 2017-11-05 DIAGNOSIS — H6693 Otitis media, unspecified, bilateral: Secondary | ICD-10-CM | POA: Diagnosis not present

## 2017-11-05 DIAGNOSIS — Z806 Family history of leukemia: Secondary | ICD-10-CM | POA: Insufficient documentation

## 2017-11-05 DIAGNOSIS — K5900 Constipation, unspecified: Secondary | ICD-10-CM | POA: Diagnosis not present

## 2017-11-05 DIAGNOSIS — E119 Type 2 diabetes mellitus without complications: Secondary | ICD-10-CM | POA: Insufficient documentation

## 2017-11-05 DIAGNOSIS — H73892 Other specified disorders of tympanic membrane, left ear: Secondary | ICD-10-CM | POA: Diagnosis not present

## 2017-11-05 DIAGNOSIS — H6980 Other specified disorders of Eustachian tube, unspecified ear: Secondary | ICD-10-CM | POA: Diagnosis not present

## 2017-11-05 HISTORY — PX: MYRINGOTOMY WITH TUBE PLACEMENT: SHX5663

## 2017-11-05 HISTORY — DX: Irritable bowel syndrome, unspecified: K58.9

## 2017-11-05 LAB — BASIC METABOLIC PANEL
Anion gap: 8 (ref 5–15)
BUN: 17 mg/dL (ref 6–20)
CO2: 31 mmol/L (ref 22–32)
CREATININE: 1.05 mg/dL — AB (ref 0.44–1.00)
Calcium: 8.4 mg/dL — ABNORMAL LOW (ref 8.9–10.3)
Chloride: 97 mmol/L — ABNORMAL LOW (ref 101–111)
GFR calc Af Amer: >60 mL/min (ref >60)
Glucose, Bld: 169 mg/dL — ABNORMAL HIGH (ref 65–99)
Potassium: 3.4 mmol/L — ABNORMAL LOW (ref 3.5–5.1)
SODIUM: 136 mmol/L (ref 135–145)

## 2017-11-05 LAB — CBC
HCT: 34.2 % — ABNORMAL LOW (ref 36.0–46.0)
Hemoglobin: 11.1 g/dL — ABNORMAL LOW (ref 12.0–15.0)
MCH: 30.3 pg (ref 26.0–34.0)
MCHC: 32.5 g/dL (ref 30.0–36.0)
MCV: 93.4 fL (ref 78.0–100.0)
PLATELETS: 231 10*3/uL (ref 150–400)
RBC: 3.66 MIL/uL — ABNORMAL LOW (ref 3.87–5.11)
RDW: 13.1 % (ref 11.5–15.5)
WBC: 7.8 10*3/uL (ref 4.0–10.5)

## 2017-11-05 LAB — HCG, SERUM, QUALITATIVE: PREG SERUM: NEGATIVE

## 2017-11-05 LAB — GLUCOSE, CAPILLARY
GLUCOSE-CAPILLARY: 158 mg/dL — AB (ref 65–99)
Glucose-Capillary: 175 mg/dL — ABNORMAL HIGH (ref 65–99)

## 2017-11-05 SURGERY — MYRINGOTOMY WITH TUBE PLACEMENT
Anesthesia: General | Site: Ear | Laterality: Right

## 2017-11-05 MED ORDER — PROPOFOL 10 MG/ML IV BOLUS
INTRAVENOUS | Status: DC | PRN
  Administered 2017-11-05: 110 mg via INTRAVENOUS

## 2017-11-05 MED ORDER — DEXAMETHASONE SODIUM PHOSPHATE 10 MG/ML IJ SOLN
INTRAMUSCULAR | Status: DC | PRN
  Administered 2017-11-05: 5 mg via INTRAVENOUS

## 2017-11-05 MED ORDER — ONDANSETRON HCL 4 MG/2ML IJ SOLN
INTRAMUSCULAR | Status: AC
  Filled 2017-11-05: qty 2

## 2017-11-05 MED ORDER — PROPOFOL 10 MG/ML IV BOLUS
INTRAVENOUS | Status: AC
  Filled 2017-11-05: qty 20

## 2017-11-05 MED ORDER — FENTANYL CITRATE (PF) 250 MCG/5ML IJ SOLN
INTRAMUSCULAR | Status: AC
  Filled 2017-11-05: qty 5

## 2017-11-05 MED ORDER — MIDAZOLAM HCL 2 MG/2ML IJ SOLN
INTRAMUSCULAR | Status: AC
  Filled 2017-11-05: qty 2

## 2017-11-05 MED ORDER — LIDOCAINE 2% (20 MG/ML) 5 ML SYRINGE
INTRAMUSCULAR | Status: AC
  Filled 2017-11-05: qty 5

## 2017-11-05 MED ORDER — ONDANSETRON HCL 4 MG/2ML IJ SOLN
INTRAMUSCULAR | Status: DC | PRN
  Administered 2017-11-05: 4 mg via INTRAVENOUS

## 2017-11-05 MED ORDER — ONDANSETRON HCL 4 MG/2ML IJ SOLN: 4.0000 mg | Freq: Once | INTRAMUSCULAR | Status: DC | PRN

## 2017-11-05 MED ORDER — CIPROFLOXACIN-DEXAMETHASONE 0.3-0.1 % OT SUSP
OTIC | Status: AC
  Filled 2017-11-05: qty 7.5

## 2017-11-05 MED ORDER — ATORVASTATIN CALCIUM 10 MG PO TABS: 10.0000 mg | ORAL_TABLET | Freq: Every day | ORAL | 3 refills | Status: DC

## 2017-11-05 MED ORDER — FENTANYL CITRATE (PF) 250 MCG/5ML IJ SOLN
INTRAMUSCULAR | Status: DC | PRN
  Administered 2017-11-05: 50 ug via INTRAVENOUS

## 2017-11-05 MED ORDER — LIDOCAINE 2% (20 MG/ML) 5 ML SYRINGE
INTRAMUSCULAR | Status: DC | PRN
  Administered 2017-11-05: 60 mg via INTRAVENOUS

## 2017-11-05 MED ORDER — 0.9 % SODIUM CHLORIDE (POUR BTL) OPTIME
TOPICAL | Status: DC | PRN
  Administered 2017-11-05: 1000 mL

## 2017-11-05 MED ORDER — LACTATED RINGERS IV SOLN
INTRAVENOUS | Status: DC | PRN
  Administered 2017-11-05: 07:00:00 via INTRAVENOUS

## 2017-11-05 MED ORDER — CIPROFLOXACIN-DEXAMETHASONE 0.3-0.1 % OT SUSP
OTIC | Status: DC | PRN
  Administered 2017-11-05: 4 [drp] via OTIC

## 2017-11-05 MED ORDER — DEXAMETHASONE SODIUM PHOSPHATE 10 MG/ML IJ SOLN
INTRAMUSCULAR | Status: AC
  Filled 2017-11-05: qty 1

## 2017-11-05 MED ORDER — FENTANYL CITRATE (PF) 100 MCG/2ML IJ SOLN: 25.0000 ug | INTRAMUSCULAR | Status: DC | PRN

## 2017-11-05 SURGICAL SUPPLY — 25 items
BLADE MYRINGOTOMY 6 SPEAR HDL (BLADE) ×2 IMPLANT
BLADE MYRINGOTOMY 6" SPEAR HDL (BLADE) ×1
BLADE SURG 15 STRL LF DISP TIS (BLADE) IMPLANT
BLADE SURG 15 STRL SS (BLADE)
CANISTER SUCT 3000ML PPV (MISCELLANEOUS) ×3 IMPLANT
CONT SPEC 4OZ CLIKSEAL STRL BL (MISCELLANEOUS) IMPLANT
COVER MAYO STAND STRL (DRAPES) ×3 IMPLANT
CRADLE DONUT ADULT HEAD (MISCELLANEOUS) IMPLANT
DRAPE HALF SHEET 40X57 (DRAPES) IMPLANT
GLOVE ECLIPSE 7.5 STRL STRAW (GLOVE) ×3 IMPLANT
KIT BASIN OR (CUSTOM PROCEDURE TRAY) ×3 IMPLANT
KIT ROOM TURNOVER OR (KITS) ×3 IMPLANT
NDL HYPO 25GX1X1/2 BEV (NEEDLE) IMPLANT
NEEDLE HYPO 25GX1X1/2 BEV (NEEDLE) IMPLANT
NS IRRIG 1000ML POUR BTL (IV SOLUTION) ×3 IMPLANT
PAD ARMBOARD 7.5X6 YLW CONV (MISCELLANEOUS) ×3 IMPLANT
PROS SHEEHY TY XOMED (OTOLOGIC RELATED)
SYR BULB 3OZ (MISCELLANEOUS) IMPLANT
TOWEL OR 17X24 6PK STRL BLUE (TOWEL DISPOSABLE) ×3 IMPLANT
TUBE CONNECTING 12'X1/4 (SUCTIONS) ×1
TUBE CONNECTING 12X1/4 (SUCTIONS) ×2 IMPLANT
TUBE EAR SHEEHY BUTTON 1.27 (OTOLOGIC RELATED) ×2 IMPLANT
TUBE EAR T MOD 1.32X4.8 BL (OTOLOGIC RELATED) ×1 IMPLANT
TUBE T ENT MOD 1.32X4.8 BL (OTOLOGIC RELATED) ×1
TUBING EXTENTION W/L.L. (IV SETS) ×3 IMPLANT

## 2017-11-05 NOTE — Anesthesia Postprocedure Evaluation (Signed)
Anesthesia Post Note  Patient: Brandi Gomez  Procedure(s) Performed: MYRINGOTOMY WITH TUBE PLACEMENT (Right Ear)     Patient location during evaluation: PACU Anesthesia Type: General Level of consciousness: awake and alert Pain management: pain level controlled Vital Signs Assessment: post-procedure vital signs reviewed and stable Respiratory status: spontaneous breathing, nonlabored ventilation and respiratory function stable Cardiovascular status: blood pressure returned to baseline and stable Postop Assessment: no apparent nausea or vomiting Anesthetic complications: no    Last Vitals:  Vitals:   11/05/17 0830 11/05/17 0845  BP: 106/66 105/76  Pulse: 64 64  Resp: 13 15  Temp:  36.8 C  SpO2: 97% 99%    Last Pain:  Vitals:   11/05/17 6295  TempSrc: Oral                 Audry Pili

## 2017-11-05 NOTE — Transfer of Care (Signed)
Immediate Anesthesia Transfer of Care Note  Patient: Brandi Gomez  Procedure(s) Performed: MYRINGOTOMY WITH TUBE PLACEMENT (Right Ear)  Patient Location: PACU  Anesthesia Type:General  Level of Consciousness: drowsy and patient cooperative  Airway & Oxygen Therapy: Patient Spontanous Breathing and Patient connected to face mask oxygen  Post-op Assessment: Report given to RN and Post -op Vital signs reviewed and stable  Post vital signs: Reviewed and stable  Last Vitals:  Vitals:   11/05/17 0759 11/05/17 0800  BP:  119/84  Pulse:  69  Resp:  15  Temp: 36.9 C   SpO2:  94%    Last Pain:  Vitals:   11/05/17 0611  TempSrc: Oral         Complications: No apparent anesthesia complications

## 2017-11-05 NOTE — Op Note (Signed)
Preop/postop diagnosis:Eustachian tube dysfunction Procedure: Right T-tube placement Anesthesia: Gen. Estimated blood loss less than 5 mL Indications: Patient with persistent eustachian tube dysfunction and severe retraction. She has a perforation in the left tympanic membrane and now needs a tube in the right secondary to the retraction. She is informed risks and benefits of the procedure and options were discussed all questions are answered and consent was obtained. Operation patient And placed in supine position after general mask ventilation anesthesia was placed in the right gaze position and the left ear was examined. The perforation was still present and there was no thickening of the middle ear mucosa. No epithelial debris. Right ear was then examined under or microscope direction. The tympanic membrane severely retracted even opened the anterior superior quadrant. A myringotomy was made in the [place]. Aspect of the tympanic membrane. The tympanic membrane was suctioned and brought off of the [place] easily. A larger myringotomy is made to get a T-tube in place which sat in the anteriorsuperior quadrant.Ciprodex was instilled. There was no evidence of epithelial debris. Patient was awake and brought to recover in stable condition counts correct

## 2017-11-05 NOTE — H&P (Signed)
Brandi Gomez is an 46 y.o. female.   Chief Complaint: ear fluid HPI: hx of tubes and now needs another  Past Medical History:  Diagnosis Date  . Anemia   . Anxiety   . Bipolar 1 disorder ([place])   . Common migraine 05/19/2015  . Depression   . Hypertension   . Irritable bowel syndrome (IBS)   . Mild mental retardation   . Obesity   . Partial complex seizure disorder with intractable epilepsy ([place]) 05/12/2014  . Seizures ([place])    intractable, sz 08/23/17  . Sleep apnea   . Stroke ([place])   . Type II or unspecified type diabetes mellitus without mention of complication, not stated as uncontrolled     Past Surgical History:  Procedure Laterality Date  . COLONOSCOPY     2012-normal , Dr Sharlett Iles  . ESOPHAGOGASTRODUODENOSCOPY     normal-Dr Patterson 2012  . LOOP RECORDER INSERTION N/A 05/30/2017   Procedure: Loop Recorder Insertion;  Surgeon: Constance Haw, MD;  Location: [facility];  Service: Cardiovascular;  Laterality: N/A;  . MYRINGOTOMY WITH TUBE PLACEMENT    . NASAL SINUS SURGERY    . TEE WITHOUT CARDIOVERSION N/A 05/30/2017   Procedure: TRANSESOPHAGEAL ECHOCARDIOGRAM (TEE);  Surgeon: Acie Fredrickson Wonda Cheng, MD;  Location: [facility];  Service: Cardiovascular;  Laterality: N/A;    Family History  Problem Relation Age of Onset  . Diabetes Mother        passed away from accidental death  . Hypertension Mother   . Bipolar disorder Father   . Diabetes Daughter   . Leukemia Daughter   . Ovarian cancer Maternal Aunt    Social History:  reports that  has never smoked. she has never used smokeless tobacco. She reports that she does not drink alcohol or use drugs.  Allergies:  Allergies  Allergen Reactions  . Amoxicillin Itching    Has patient had a PCN reaction causing immediate rash, facial/tongue/throat swelling, SOB or lightheadedness with hypotension: yes Has patient had a PCN reaction causing severe rash involving mucus membranes or skin necrosis: no Has  patient had a PCN reaction that required hospitalization: no Has patient had a PCN reaction occurring within the last 10 years: yes If all of the above answers are "NO", then may proceed with Cephalosporin use.  Marland Kitchen Lisinopril Swelling    Angioedema   . Hydrocodone Other (See Comments)    Depressed     Medications Prior to Admission  Medication Sig Dispense Refill  . aspirin 325 MG tablet Take 1 tablet (325 mg total) by mouth daily. 30 tablet 1  . atorvastatin (LIPITOR) 10 MG tablet Take 1 tablet (10 mg total) by mouth daily at 6 PM. 30 tablet 3  . carvedilol (COREG) 3.125 MG tablet Take 1 tablet (3.125 mg total) by mouth 2 (two) times daily. 60 tablet 3  . dicyclomine (BENTYL) 10 MG capsule Take 1 capsule (10 mg total) by mouth 3 (three) times daily before meals. 90 capsule 1  . docusate sodium (COLACE) 100 MG capsule Take 1 capsule (100 mg total) by mouth 2 (two) times daily. 10 capsule 0  . DULoxetine (CYMBALTA) 30 MG capsule Take 1 capsule (30 mg total) by mouth daily. 30 capsule 3  . levETIRAcetam (KEPPRA) 500 MG tablet Take 1 tablet (500 mg total) by mouth 2 (two) times daily. 180 tablet 1  . magnesium citrate SOLN Take 1 Bottle by mouth once.    . phenytoin (DILANTIN) 100 MG ER capsule  TAKE 2 CAPSULES IN THE MORNING AND 3 CAPSULES IN THE EVENING (Patient taking differently: Take 200-300 mg by mouth See admin instructions. TAKE 2 CAPSULES IN THE MORNING AND 3 CAPSULES IN THE EVENING) 450 capsule 2  . sitaGLIPtin (JANUVIA) 50 MG tablet Take 1 tablet (50 mg total) by mouth daily. For control of blood sugar 90 tablet 1  . torsemide (DEMADEX) 20 MG tablet Take 1 tablet (20 mg total) by mouth daily. 90 tablet 1  . ACCU-CHEK SOFTCLIX LANCETS lancets Use as instructed 100 each 12  . alum & mag hydroxide-simeth (MYLANTA DOUBLE-STRENGTH) 400-400-40 MG/5ML suspension Take 10 mLs by mouth every 6 (six) hours as needed for indigestion. (Patient not taking: Reported on 10/08/2017) 355 mL 0  .  famotidine (PEPCID) 20 MG tablet TAKE 1 TABLET BY MOUTH TWICE A DAY (Patient not taking: Reported on 10/08/2017) 15 tablet 0  . glucose blood test strip Use as instructed 100 each 12  . ibuprofen (ADVIL,MOTRIN) 200 MG tablet Take 400 mg by mouth every 6 (six) hours as needed for headache or mild pain.    . nitrofurantoin, macrocrystal-monohydrate, (MACROBID) 100 MG capsule Take 1 capsule (100 mg total) by mouth 2 (two) times daily. (Patient not taking: Reported on 10/08/2017) 10 capsule 0    Results for orders placed or performed during the hospital encounter of 11/05/17 (from the past 48 hour(s))  Glucose, capillary     Status: Abnormal   Collection Time: 11/05/17  6:14 AM  Result Value Ref Range   Glucose-Capillary 175 (H) 65 - 99 mg/dL   No results found.  Review of Systems  Constitutional: Negative.   HENT: Negative.   Eyes: Negative.   Respiratory: Negative.   Cardiovascular: Negative.   Skin: Negative.     Blood pressure 116/80, pulse 75, temperature 98 F (36.7 C), temperature source Oral, resp. rate 18, height 5\' 1"  (1.549 m), weight 114.3 kg (252 lb), last menstrual period 11/03/2017, SpO2 99 %. Physical Exam  Constitutional: She appears well-developed.  HENT:  Head: Normocephalic and atraumatic.  Mouth/Throat: Oropharynx is clear and moist.  Eyes: Conjunctivae are normal. Pupils are equal, round, and reactive to light.  Neck: Normal range of motion. Neck supple.  Cardiovascular: Normal rate.  Respiratory: Effort normal.  GI: Soft.  Musculoskeletal: Normal range of motion.     Assessment/Plan ETD- discussed tubes and ready to proceed  Melissa Montane, MD 11/05/2017, 7:21 AM

## 2017-11-06 ENCOUNTER — Encounter (HOSPITAL_COMMUNITY): Payer: Self-pay | Admitting: Otolaryngology

## 2017-11-06 ENCOUNTER — Other Ambulatory Visit: Payer: Self-pay | Admitting: Emergency Medicine

## 2017-11-06 LAB — HEMOGLOBIN A1C
HEMOGLOBIN A1C: 7.3 % — AB (ref 4.8–5.6)
Mean Plasma Glucose: 163 mg/dL

## 2017-11-07 ENCOUNTER — Other Ambulatory Visit: Payer: Self-pay

## 2017-11-07 MED ORDER — DOCUSATE SODIUM 100 MG PO CAPS: 100.0000 mg | ORAL_CAPSULE | Freq: Two times a day (BID) | ORAL | 0 refills | Status: DC

## 2017-11-07 NOTE — Telephone Encounter (Signed)
refilled 

## 2017-11-07 NOTE — Telephone Encounter (Signed)
Can pt have more refills now or does she need to be seen in office first?

## 2017-11-11 ENCOUNTER — Other Ambulatory Visit: Payer: Self-pay | Admitting: Emergency Medicine

## 2017-11-25 ENCOUNTER — Telehealth: Payer: Self-pay | Admitting: *Deleted

## 2017-11-25 NOTE — Telephone Encounter (Signed)
Faxed not authorized Rx for polyeth glyc powder to patient's pharmacy. Confirmation page received at 1:15 pm.

## 2017-11-28 ENCOUNTER — Other Ambulatory Visit: Payer: Self-pay | Admitting: *Deleted

## 2017-11-28 MED ORDER — PHENYTOIN SODIUM EXTENDED 100 MG PO CAPS: ORAL_CAPSULE | ORAL | 1 refills | Status: DC

## 2017-12-02 ENCOUNTER — Encounter (HOSPITAL_COMMUNITY): Payer: Self-pay | Admitting: Emergency Medicine

## 2017-12-02 ENCOUNTER — Emergency Department (HOSPITAL_COMMUNITY)
Admission: EM | Admit: 2017-12-02 | Discharge: 2017-12-02 | Disposition: A | Discharge status: Home / Self Care | Payer: Medicare HMO | Attending: Emergency Medicine | Admitting: Emergency Medicine

## 2017-12-02 ENCOUNTER — Emergency Department (HOSPITAL_COMMUNITY): Payer: Medicare HMO

## 2017-12-02 DIAGNOSIS — K5900 Constipation, unspecified: Secondary | ICD-10-CM | POA: Diagnosis not present

## 2017-12-02 DIAGNOSIS — I10 Essential (primary) hypertension: Secondary | ICD-10-CM | POA: Insufficient documentation

## 2017-12-02 DIAGNOSIS — Z7982 Long term (current) use of aspirin: Secondary | ICD-10-CM | POA: Insufficient documentation

## 2017-12-02 DIAGNOSIS — E119 Type 2 diabetes mellitus without complications: Secondary | ICD-10-CM | POA: Insufficient documentation

## 2017-12-02 MED ORDER — DOCUSATE SODIUM 100 MG PO CAPS: 100.0000 mg | ORAL_CAPSULE | Freq: Two times a day (BID) | ORAL | 0 refills | Status: DC

## 2017-12-02 MED ORDER — FLEET ENEMA 7-19 GM/118ML RE ENEM
1.0000 | ENEMA | Freq: Once | RECTAL | Status: AC
  Administered 2017-12-02: 1 via RECTAL
  Filled 2017-12-02: qty 1

## 2017-12-02 NOTE — ED Provider Notes (Signed)
[facility] DEPT Provider Note   CSN: [id number] Arrival date & time: 12/02/17  1425     History   Chief Complaint Chief Complaint  Patient presents with  . Constipation    HPI LOSSIE KALP is a 46 y.o. female with a past medical history of IBS, diabetes, hypertension, who presents to ED for evaluation of 3-day history of constipation.  States that she sometimes has similar symptoms occur every now and then.  She has tried an over-the-counter suppository this morning with no relief in her symptoms.  She is also tried digital disimpaction on her own without improvement.  States that in the past an enema has worked for her.  She denies any nausea, vomiting, abdominal pain, urinary symptoms, hematochezia or melena.  She denies any new changes in her medications.  HPI  Past Medical History:  Diagnosis Date  . Anemia   . Anxiety   . Bipolar 1 disorder ([place])   . Common migraine 05/19/2015  . Depression   . Hypertension   . Irritable bowel syndrome (IBS)   . Mild mental retardation   . Obesity   . Partial complex seizure disorder with intractable epilepsy ([place]) 05/12/2014  . Seizures ([place])    intractable, sz 08/23/17  . Sleep apnea   . Stroke ([place])   . Type II or unspecified type diabetes mellitus without mention of complication, not stated as uncontrolled     Patient Active Problem List   Diagnosis Date Noted  . Malodorous urine 10/08/2017  . Lower urinary tract symptoms (LUTS) 10/08/2017  . Acute UTI 10/08/2017  . Constipation 10/08/2017  . History of recent stroke 08/06/2017  . Angioedema 06/03/2017  . OSA (obstructive sleep apnea)   . Cerebrovascular accident (CVA) ([place]) 05/20/2017  . Controlled type 2 diabetes mellitus without complication, without long-term current use of insulin ([place]) 05/08/2017  . Essential hypertension 07/27/2008  . Convulsions ([place]) 02/05/2007    Past Surgical History:  Procedure Laterality Date  .  COLONOSCOPY     2012-normal , Dr Sharlett Iles  . ESOPHAGOGASTRODUODENOSCOPY     normal-Dr Patterson 2012  . LOOP RECORDER INSERTION N/A 05/30/2017   Procedure: Loop Recorder Insertion;  Surgeon: Constance Haw, MD;  Location: [facility];  Service: Cardiovascular;  Laterality: N/A;  . MYRINGOTOMY WITH TUBE PLACEMENT    . MYRINGOTOMY WITH TUBE PLACEMENT Right 11/05/2017   Procedure: MYRINGOTOMY WITH TUBE PLACEMENT;  Surgeon: Melissa Montane, MD;  Location: [facility];  Service: ENT;  Laterality: Right;  right T Tube placement  . NASAL SINUS SURGERY    . TEE WITHOUT CARDIOVERSION N/A 05/30/2017   Procedure: TRANSESOPHAGEAL ECHOCARDIOGRAM (TEE);  Surgeon: Acie Fredrickson Wonda Cheng, MD;  Location: [facility] ENDOSCOPY;  Service: Cardiovascular;  Laterality: N/A;    OB History    No data available       Home Medications    Prior to Admission medications   Medication Sig Start Date End Date Taking? Authorizing Provider  ACCU-CHEK SOFTCLIX LANCETS lancets Use as instructed 06/06/17   Horald Pollen, MD  alum & mag hydroxide-simeth (MYLANTA DOUBLE-STRENGTH) 606-301-60 MG/5ML suspension Take 10 mLs by mouth every 6 (six) hours as needed for indigestion. Patient not taking: Reported on 10/08/2017 09/17/17   Forde Dandy, MD  aspirin 325 MG tablet Take 1 tablet (325 mg total) by mouth daily. 05/23/17   Barton Dubois, MD  atorvastatin (LIPITOR) 10 MG tablet Take 1 tablet (10 mg total) by mouth daily at 6 PM. 11/05/17  02/03/18  Horald Pollen, MD  carvedilol (COREG) 3.125 MG tablet Take 1 tablet (3.125 mg total) by mouth 2 (two) times daily. 05/22/17 05/22/18  Barton Dubois, MD  dicyclomine (BENTYL) 10 MG capsule Take 1 capsule (10 mg total) by mouth 3 (three) times daily before meals. 11/04/17   Doran Stabler, MD  docusate sodium (COLACE) 100 MG capsule Take 1 capsule (100 mg total) by mouth every 12 (twelve) hours. 12/02/17   Helga Asbury, PA-C  DULoxetine (CYMBALTA) 30 MG capsule Take 1 capsule  (30 mg total) by mouth daily. 08/06/17 11/05/17  Horald Pollen, MD  famotidine (PEPCID) 20 MG tablet TAKE 1 TABLET BY MOUTH TWICE A DAY Patient not taking: Reported on 10/08/2017 06/16/17   Marijean Heath, NP  glucose blood test strip Use as instructed 06/06/17   Horald Pollen, MD  ibuprofen (ADVIL,MOTRIN) 200 MG tablet Take 400 mg by mouth every 6 (six) hours as needed for headache or mild pain.    [provider]  levETIRAcetam (KEPPRA) 500 MG tablet Take 1 tablet (500 mg total) by mouth 2 (two) times daily. 11/03/17   Ward Givens, NP  magnesium citrate SOLN Take 1 Bottle by mouth once.    [provider]  nitrofurantoin, macrocrystal-monohydrate, (MACROBID) 100 MG capsule Take 1 capsule (100 mg total) by mouth 2 (two) times daily. Patient not taking: Reported on 10/08/2017 09/01/17   Ok Edwards, PA-C  phenytoin (DILANTIN) 100 MG ER capsule TAKE 2 CAPSULES IN THE MORNING AND 3 CAPSULES IN THE EVENING 11/28/17   [name], MD  sitaGLIPtin (JANUVIA) 50 MG tablet Take 1 tablet (50 mg total) by mouth daily. For control of blood sugar 05/08/17   Sagardia, Ines Bloomer, MD  torsemide (DEMADEX) 20 MG tablet Take 1 tablet (20 mg total) by mouth daily. 05/08/17   Horald Pollen, MD    Family History Family History  Problem Relation Age of Onset  . Diabetes Mother        passed away from accidental death  . Hypertension Mother   . Bipolar disorder Father   . Diabetes Daughter   . Leukemia Daughter   . Ovarian cancer Maternal Aunt     Social History Social History   Tobacco Use  . Smoking status: Never Smoker  . Smokeless tobacco: Never Used  Substance Use Topics  . Alcohol use: No  . Drug use: No     Allergies   Amoxicillin; Lisinopril; and Hydrocodone   Review of Systems Review of Systems  Constitutional: Positive for appetite change. Negative for chills and fever.  HENT: Negative for ear pain, rhinorrhea, sneezing and sore  throat.   Eyes: Negative for photophobia and visual disturbance.  Respiratory: Negative for cough, chest tightness, shortness of breath and wheezing.   Cardiovascular: Negative for chest pain and palpitations.  Gastrointestinal: Positive for constipation. Negative for abdominal pain, blood in stool, diarrhea, nausea and vomiting.  Genitourinary: Negative for dysuria, hematuria and urgency.  Musculoskeletal: Negative for myalgias.  Skin: Negative for rash.  Neurological: Negative for dizziness, weakness and light-headedness.     Physical Exam Updated Vital Signs BP 105/83 (BP Location: Right Arm)   Pulse 83   Temp 98.6 F (37 C) (Oral)   Resp 20   LMP 11/03/2017   SpO2 100%   Physical Exam  Constitutional: She appears well-developed and well-nourished. No distress.  Nontoxic appearing and in no acute distress.  HENT:  Head: Normocephalic and atraumatic.  Nose:  Nose normal.  Eyes: Conjunctivae and EOM are normal. Left eye exhibits no discharge. No scleral icterus.  Neck: Normal range of motion. Neck supple.  Cardiovascular: Normal rate, regular rhythm, normal heart sounds and intact distal pulses. Exam reveals no gallop and no friction rub.  No murmur heard. Pulmonary/Chest: Effort normal and breath sounds normal. No respiratory distress.  Abdominal: Soft. Bowel sounds are normal. She exhibits no distension. There is no tenderness. There is no guarding.  No abdominal tenderness to palpation.  Musculoskeletal: Normal range of motion. She exhibits no edema.  Neurological: She is alert. She exhibits normal muscle tone. Coordination normal.  Skin: Skin is warm and dry. No rash noted.  Psychiatric: She has a normal mood and affect.  Nursing note and vitals reviewed.    ED Treatments / Results  Labs (all labs ordered are listed, but only abnormal results are displayed) Labs Reviewed - No data to display  EKG  EKG Interpretation None       Radiology Dg Abdomen 1  View  Result Date: 12/02/2017 CLINICAL DATA:  Constipated for 3 days EXAM: ABDOMEN - 1 VIEW COMPARISON:  None. FINDINGS: There is a large amount of stool throughout the colon. There is no bowel dilatation to suggest obstruction. There is no evidence of pneumoperitoneum, portal venous gas or pneumatosis. There are no pathologic calcifications along the expected course of the ureters. The osseous structures are unremarkable. IMPRESSION: Large amount of stool throughout the colon. Electronically Signed   By: Kathreen Devoid   On: 12/02/2017 15:43    Procedures Procedures (including critical care time)  Medications Ordered in ED Medications  sodium phosphate (FLEET) 7-19 GM/118ML enema 1 enema (1 enema Rectal Given 12/02/17 1606)     Initial Impression / Assessment and Plan / ED Course  I have reviewed the triage vital signs and the nursing notes.  Pertinent labs & imaging results that were available during my care of the patient were reviewed by me and considered in my medical decision making (see chart for details).     Patient of constipation for the past 3 days.  She does have a history of IBS and states that she has intermittent constipation from time to time.  She tried over-the-counter suppository with no relief in her symptoms as well as trying to manually disimpact herself.  She denies any abdominal pain, vomiting, recent surgeries, chest pain.  She has no abdominal tenderness to palpation.  She is overall well-appearing.  Abdominal x-ray shows nonobstructive pattern with large amount of stool noted.  Patient given Fleet enema and reports complete resolution of her symptoms.  She did have a bowel discharge her with bowel regimen information about increasing fiber in diet.  Advised to follow-up with her primary care provider for further evaluation. Patient appears stable for discharge at this time. Strict return precautions given.  Final Clinical Impressions(s) / ED Diagnoses   Final  diagnoses:  Constipation, unspecified constipation type    ED Discharge Orders        Ordered    docusate sodium (COLACE) 100 MG capsule  Every 12 hours     12/02/17 [place], [place], PA-C 12/02/17 1715    Tegeler, Gwenyth Allegra, MD 12/03/17 [phone]

## 2017-12-02 NOTE — Discharge Instructions (Signed)
Please read attached information regarding your condition and high fiber foods. Take Colace as needed for constipation. Follow-up with your primary care provider for further evaluation. Return to ED for worsening symptoms, severe abdominal pain, blood in stool, increased vomiting, lightheadedness or loss of consciousness.

## 2017-12-02 NOTE — ED Triage Notes (Signed)
Patient c/o constipation x3 days. Reports using suppository with no relief. Denies vomiting.

## 2017-12-08 ENCOUNTER — Telehealth: Payer: Self-pay | Admitting: Emergency Medicine

## 2017-12-08 NOTE — Telephone Encounter (Signed)
Copied from [place]. Topic: Quick Communication - See Telephone Encounter >> Dec 08, 2017 11:13 AM Ahmed Prima L wrote: CRM for notification. See Telephone encounter for:   12/08/17.  Pt requesting a refill on her CARVEDILOL [facility] [address]

## 2017-12-08 NOTE — Telephone Encounter (Signed)
Medication   Request    Carvedilol   -    Last   rx  05/22/2017   -    Last ov   10/08/2017    For  Unrelated   Encounter     Rx  Was  Written  By   Susa Raring   Hospitalist  ?

## 2017-12-08 NOTE — Telephone Encounter (Signed)
Patient called in for her refill request on Carvedilol, she says she is out, I put her on hold to notify the office, she hung up without waiting for a response, message routed to office.

## 2017-12-10 NOTE — Telephone Encounter (Signed)
Pt needs to be seen, never prescribed Carvedilol from this office.   Sent to Scheduling to call and make appt asap.

## 2017-12-11 NOTE — Telephone Encounter (Signed)
Please review message thread.

## 2017-12-11 NOTE — Telephone Encounter (Signed)
Pt called to check on refill status.  Pt has made a med refill apt for 1/8   Pt has been out of her bp med for about 2 weeks.

## 2017-12-12 ENCOUNTER — Other Ambulatory Visit: Payer: Self-pay | Admitting: Emergency Medicine

## 2017-12-12 MED ORDER — CARVEDILOL 3.125 MG PO TABS: 3.1250 mg | ORAL_TABLET | Freq: Two times a day (BID) | ORAL | 3 refills | Status: DC

## 2017-12-12 NOTE — Telephone Encounter (Signed)
Refill sent to pharmacy.   

## 2017-12-16 ENCOUNTER — Ambulatory Visit: Payer: Medicare HMO | Admitting: Emergency Medicine

## 2017-12-23 ENCOUNTER — Encounter: Payer: Self-pay | Admitting: Emergency Medicine

## 2017-12-23 ENCOUNTER — Ambulatory Visit (INDEPENDENT_AMBULATORY_CARE_PROVIDER_SITE_OTHER): Payer: Medicare HMO | Admitting: Emergency Medicine

## 2017-12-23 VITALS — BP 132/80 | HR 88 | Temp 98.8°F | Resp 17 | Ht 61.0 in | Wt 253.0 lb

## 2017-12-23 DIAGNOSIS — I10 Essential (primary) hypertension: Secondary | ICD-10-CM

## 2017-12-23 DIAGNOSIS — E119 Type 2 diabetes mellitus without complications: Secondary | ICD-10-CM | POA: Diagnosis not present

## 2017-12-23 MED ORDER — ATORVASTATIN CALCIUM 10 MG PO TABS: 10.0000 mg | ORAL_TABLET | Freq: Every day | ORAL | 3 refills | Status: DC

## 2017-12-23 NOTE — Progress Notes (Signed)
Brandi Gomez 47 y.o.   Chief Complaint  Patient presents with  . Medication Refill    lipitor     HISTORY OF PRESENT ILLNESS: This is a 47 y.o. female doing well; needs Lipitor refill. Latest lab results reviewed; renal function improved.  HPI   Prior to Admission medications   Medication Sig Start Date End Date Taking? Authorizing Provider  ACCU-CHEK SOFTCLIX LANCETS lancets Use as instructed 06/06/17  Yes Yilia Sacca, Ines Bloomer, MD  alum & mag hydroxide-simeth (MYLANTA DOUBLE-STRENGTH) 981-191-47 MG/5ML suspension Take 10 mLs by mouth every 6 (six) hours as needed for indigestion. 09/17/17  Yes Forde Dandy, MD  aspirin 325 MG tablet Take 1 tablet (325 mg total) by mouth daily. 05/23/17  Yes Barton Dubois, MD  atorvastatin (LIPITOR) 10 MG tablet Take 1 tablet (10 mg total) by mouth daily at 6 PM. 11/05/17 02/03/18 Yes Jerimy Johanson, Ines Bloomer, MD  carvedilol (COREG) 3.125 MG tablet Take 1 tablet (3.125 mg total) by mouth 2 (two) times daily. 12/12/17 12/12/18 Yes Rozanne Heumann, Ines Bloomer, MD  dicyclomine (BENTYL) 10 MG capsule Take 1 capsule (10 mg total) by mouth 3 (three) times daily before meals. 11/04/17  Yes Danis, Kirke Corin, MD  docusate sodium (COLACE) 100 MG capsule Take 1 capsule (100 mg total) by mouth every 12 (twelve) hours. 12/02/17  Yes Khatri, Hina, PA-C  famotidine (PEPCID) 20 MG tablet TAKE 1 TABLET BY MOUTH TWICE A DAY 06/16/17  Yes Whiteheart, Cristal Ford, NP  glucose blood test strip Use as instructed 06/06/17  Yes Brindy Higginbotham, Ines Bloomer, MD  ibuprofen (ADVIL,MOTRIN) 200 MG tablet Take 400 mg by mouth every 6 (six) hours as needed for headache or mild pain.   Yes [provider]  levETIRAcetam (KEPPRA) 500 MG tablet Take 1 tablet (500 mg total) by mouth 2 (two) times daily. 11/03/17  Yes Ward Givens, NP  magnesium citrate SOLN Take 1 Bottle by mouth once.   Yes [provider]  nitrofurantoin, macrocrystal-monohydrate, (MACROBID) 100 MG capsule Take 1  capsule (100 mg total) by mouth 2 (two) times daily. 09/01/17  Yes Yu, Amy V, PA-C  phenytoin (DILANTIN) 100 MG ER capsule TAKE 2 CAPSULES IN THE MORNING AND 3 CAPSULES IN THE EVENING 11/28/17  Yes [name], MD  sitaGLIPtin (JANUVIA) 50 MG tablet Take 1 tablet (50 mg total) by mouth daily. For control of blood sugar 05/08/17  Yes Ashlon Lottman, Ines Bloomer, MD  torsemide (DEMADEX) 20 MG tablet Take 1 tablet (20 mg total) by mouth daily. 05/08/17  Yes Shaquela Weichert, Ines Bloomer, MD  DULoxetine (CYMBALTA) 30 MG capsule Take 1 capsule (30 mg total) by mouth daily. 08/06/17 11/05/17  Horald Pollen, MD    Allergies  Allergen Reactions  . Amoxicillin Itching    Has patient had a PCN reaction causing immediate rash, facial/tongue/throat swelling, SOB or lightheadedness with hypotension: yes Has patient had a PCN reaction causing severe rash involving mucus membranes or skin necrosis: no Has patient had a PCN reaction that required hospitalization: no Has patient had a PCN reaction occurring within the last 10 years: yes If all of the above answers are "NO", then may proceed with Cephalosporin use.  Marland Kitchen Lisinopril Swelling    Angioedema   . Hydrocodone Other (See Comments)    Depressed     Patient Active Problem List   Diagnosis Date Noted  . Malodorous urine 10/08/2017  . Lower urinary tract symptoms (LUTS) 10/08/2017  . Acute UTI 10/08/2017  . Constipation 10/08/2017  .  History of recent stroke 08/06/2017  . Angioedema 06/03/2017  . OSA (obstructive sleep apnea)   . Cerebrovascular accident (CVA) ([place]) 05/20/2017  . Controlled type 2 diabetes mellitus without complication, without long-term current use of insulin ([place]) 05/08/2017  . Essential hypertension 07/27/2008  . Convulsions ([place]) 02/05/2007    Past Medical History:  Diagnosis Date  . Anemia   . Anxiety   . Bipolar 1 disorder ([place])   . Common migraine 05/19/2015  . Depression   . Hypertension   . Irritable bowel syndrome  (IBS)   . Mild mental retardation   . Obesity   . Partial complex seizure disorder with intractable epilepsy ([place]) 05/12/2014  . Seizures ([place])    intractable, sz 08/23/17  . Sleep apnea   . Stroke ([place])   . Type II or unspecified type diabetes mellitus without mention of complication, not stated as uncontrolled     Past Surgical History:  Procedure Laterality Date  . COLONOSCOPY     2012-normal , Dr Sharlett Iles  . ESOPHAGOGASTRODUODENOSCOPY     normal-Dr Patterson 2012  . LOOP RECORDER INSERTION N/A 05/30/2017   Procedure: Loop Recorder Insertion;  Surgeon: Constance Haw, MD;  Location: [facility];  Service: Cardiovascular;  Laterality: N/A;  . MYRINGOTOMY WITH TUBE PLACEMENT    . MYRINGOTOMY WITH TUBE PLACEMENT Right 11/05/2017   Procedure: MYRINGOTOMY WITH TUBE PLACEMENT;  Surgeon: Melissa Montane, MD;  Location: [facility];  Service: ENT;  Laterality: Right;  right T Tube placement  . NASAL SINUS SURGERY    . TEE WITHOUT CARDIOVERSION N/A 05/30/2017   Procedure: TRANSESOPHAGEAL ECHOCARDIOGRAM (TEE);  Surgeon: Acie Fredrickson Wonda Cheng, MD;  Location: [facility];  Service: Cardiovascular;  Laterality: N/A;    Social History   Socioeconomic History  . Marital status: Single    Spouse name: Brandi Gomez  . Number of children: 3  . Years of education: 56  . Highest education level: Not on file  Social Needs  . Financial resource strain: Not on file  . Food insecurity - worry: Not on file  . Food insecurity - inability: Not on file  . Transportation needs - medical: Not on file  . Transportation needs - non-medical: Not on file  Occupational History  . Occupation: disabled    Employer: DISABLED  Tobacco Use  . Smoking status: Never Smoker  . Smokeless tobacco: Never Used  Substance and Sexual Activity  . Alcohol use: No  . Drug use: No  . Sexual activity: Not on file  Other Topics Concern  . Not on file  Social History Narrative   Patient lives at home with daughter.     Patient has 3 children.    Patient is right handed.    Patient has a high school education.    Patient is on disability   Patient drinks 2 cups of caffeine daily.    Family History  Problem Relation Age of Onset  . Diabetes Mother        passed away from accidental death  . Hypertension Mother   . Bipolar disorder Father   . Diabetes Daughter   . Leukemia Daughter   . Ovarian cancer Maternal Aunt      Review of Systems  Constitutional: Negative.  Negative for chills and fever.  HENT: Negative.  Negative for congestion and sore throat.   Eyes: Negative.  Negative for blurred vision and double vision.  Respiratory: Negative.  Negative for cough and shortness of breath.   Cardiovascular: Negative.  Negative for chest pain and palpitations.  Gastrointestinal: Negative for abdominal pain, diarrhea, nausea and vomiting.  Genitourinary: Negative.  Negative for dysuria and hematuria.  Skin: Negative.  Negative for rash.  Neurological: Negative.  Negative for dizziness and headaches.  Endo/Heme/Allergies: Negative.   All other systems reviewed and are negative.  Vitals:   12/23/17 1020  BP: 132/80  Pulse: 88  Resp: 17  Temp: 98.8 F (37.1 C)  SpO2: 98%     Physical Exam  Constitutional: She is oriented to person, place, and time. She appears well-developed and well-nourished.  HENT:  Head: Normocephalic and atraumatic.  Nose: Nose normal.  Mouth/Throat: Oropharynx is clear and moist.  Eyes: Conjunctivae and EOM are normal. Pupils are equal, round, and reactive to light.  Neck: Normal range of motion. Neck supple.  Cardiovascular: Normal rate, regular rhythm and normal heart sounds.  Pulmonary/Chest: Effort normal and breath sounds normal.  Abdominal: Soft. She exhibits no distension. There is no tenderness.  Musculoskeletal: Normal range of motion.  Neurological: She is alert and oriented to person, place, and time. No sensory deficit. She exhibits normal muscle tone.  Coordination normal.  Skin: Skin is warm and dry. Capillary refill takes less than 2 seconds. No rash noted.  Psychiatric: She has a normal mood and affect. Her behavior is normal.  Vitals reviewed.  A total of 25 minutes was spent in the room with the patient, greater than 50% of which was in counseling/coordination of care.   ASSESSMENT & PLAN: Brandi Gomez was seen today for medication refill.  Diagnoses and all orders for this visit:  Essential hypertension  Controlled type 2 diabetes mellitus without complication, without long-term current use of insulin ([place])  Other orders -     atorvastatin (LIPITOR) 10 MG tablet; Take 1 tablet (10 mg total) by mouth daily at 6 PM.   Patient Instructions       IF you received an x-ray today, you will receive an invoice from [facility]. Please contact [facility] at [phone] with questions or concerns regarding your invoice.   IF you received labwork today, you will receive an invoice from [facility]. Please contact [facility] at [phone] with questions or concerns regarding your invoice.   Our billing staff will not be able to assist you with questions regarding bills from these companies.  You will be contacted with the lab results as soon as they are available. The fastest way to get your results is to activate your My Chart account. Instructions are located on the last page of this paperwork. If you have not heard from [place] regarding the results in 2 weeks, please contact this office.     Health Maintenance, Female Adopting a healthy lifestyle and getting preventive care can go a long way to promote health and wellness. Talk with your health care provider about what schedule of regular examinations is right for you. This is a good chance for you to check in with your provider about disease prevention and staying healthy. In between checkups, there are plenty of things you can do on your own. Experts have done a lot of  research about which lifestyle changes and preventive measures are most likely to keep you healthy. Ask your health care provider for more information. Weight and diet Eat a healthy diet  Be sure to include plenty of vegetables, fruits, low-fat dairy products, and lean protein.  Do not eat a lot of foods high in solid fats, added sugars, or salt.  Get regular  exercise. This is one of the most important things you can do for your health. ? Most adults should exercise for at least 150 minutes each week. The exercise should increase your heart rate and make you sweat (moderate-intensity exercise). ? Most adults should also do strengthening exercises at least twice a week. This is in addition to the moderate-intensity exercise.  Maintain a healthy weight  Body mass index (BMI) is a measurement that can be used to identify possible weight problems. It estimates body fat based on height and weight. Your health care provider can help determine your BMI and help you achieve or maintain a healthy weight.  For females 72 years of age and older: ? A BMI below 18.5 is considered underweight. ? A BMI of 18.5 to 24.9 is normal. ? A BMI of 25 to 29.9 is considered overweight. ? A BMI of 30 and above is considered obese.  Watch levels of cholesterol and blood lipids  You should start having your blood tested for lipids and cholesterol at 49 years of age, then have this test every 5 years.  You may need to have your cholesterol levels checked more often if: ? Your lipid or cholesterol levels are high. ? You are older than 49 years of age. ? You are at high risk for heart disease.  Cancer screening Lung Cancer  Lung cancer screening is recommended for adults 37-46 years old who are at high risk for lung cancer because of a history of smoking.  A yearly low-dose CT scan of the lungs is recommended for people who: ? Currently smoke. ? Have quit within the past 15 years. ? Have at least a  30-pack-year history of smoking. A pack year is smoking an average of one pack of cigarettes a day for 1 year.  Yearly screening should continue until it has been 15 years since you quit.  Yearly screening should stop if you develop a health problem that would prevent you from having lung cancer treatment.  Breast Cancer  Practice breast self-awareness. This means understanding how your breasts normally appear and feel.  It also means doing regular breast self-exams. Let your health care provider know about any changes, no matter how small.  If you are in your 20s or 30s, you should have a clinical breast exam (CBE) by a health care provider every 1-3 years as part of a regular health exam.  If you are 43 or older, have a CBE every year. Also consider having a breast X-ray (mammogram) every year.  If you have a family history of breast cancer, talk to your health care provider about genetic screening.  If you are at high risk for breast cancer, talk to your health care provider about having an MRI and a mammogram every year.  Breast cancer gene (BRCA) assessment is recommended for women who have family members with BRCA-related cancers. BRCA-related cancers include: ? Breast. ? Ovarian. ? Tubal. ? Peritoneal cancers.  Results of the assessment will determine the need for genetic counseling and BRCA1 and BRCA2 testing.  Cervical Cancer Your health care provider may recommend that you be screened regularly for cancer of the pelvic organs (ovaries, uterus, and vagina). This screening involves a pelvic examination, including checking for microscopic changes to the surface of your cervix (Pap test). You may be encouraged to have this screening done every 3 years, beginning at age 24.  For women ages 44-65, health care providers may recommend pelvic exams and Pap testing every 3  years, or they may recommend the Pap and pelvic exam, combined with testing for human papilloma virus (HPV), every  5 years. Some types of HPV increase your risk of cervical cancer. Testing for HPV may also be done on women of any age with unclear Pap test results.  Other health care providers may not recommend any screening for nonpregnant women who are considered low risk for pelvic cancer and who do not have symptoms. Ask your health care provider if a screening pelvic exam is right for you.  If you have had past treatment for cervical cancer or a condition that could lead to cancer, you need Pap tests and screening for cancer for at least 20 years after your treatment. If Pap tests have been discontinued, your risk factors (such as having a new sexual partner) need to be reassessed to determine if screening should resume. Some women have medical problems that increase the chance of getting cervical cancer. In these cases, your health care provider may recommend more frequent screening and Pap tests.  Colorectal Cancer  This type of cancer can be detected and often prevented.  Routine colorectal cancer screening usually begins at 48 years of age and continues through 49 years of age.  Your health care provider may recommend screening at an earlier age if you have risk factors for colon cancer.  Your health care provider may also recommend using home test kits to check for hidden blood in the stool.  A small camera at the end of a tube can be used to examine your colon directly (sigmoidoscopy or colonoscopy). This is done to check for the earliest forms of colorectal cancer.  Routine screening usually begins at age 45.  Direct examination of the colon should be repeated every 5-10 years through 49 years of age. However, you may need to be screened more often if early forms of precancerous polyps or small growths are found.  Skin Cancer  Check your skin from head to toe regularly.  Tell your health care provider about any new moles or changes in moles, especially if there is a change in a mole's shape  or color.  Also tell your health care provider if you have a mole that is larger than the size of a pencil eraser.  Always use sunscreen. Apply sunscreen liberally and repeatedly throughout the day.  Protect yourself by wearing long sleeves, pants, a wide-brimmed hat, and sunglasses whenever you are outside.  Heart disease, diabetes, and high blood pressure  High blood pressure causes heart disease and increases the risk of stroke. High blood pressure is more likely to develop in: ? People who have blood pressure in the high end of the normal range (130-139/85-89 mm Hg). ? People who are overweight or obese. ? People who are African American.  If you are 67-72 years of age, have your blood pressure checked every 3-5 years. If you are 60 years of age or older, have your blood pressure checked every year. You should have your blood pressure measured twice-once when you are at a hospital or clinic, and once when you are not at a hospital or clinic. Record the average of the two measurements. To check your blood pressure when you are not at a hospital or clinic, you can use: ? An automated blood pressure machine at a pharmacy. ? A home blood pressure monitor.  If you are between 65 years and 54 years old, ask your health care provider if you should take aspirin to prevent  strokes.  Have regular diabetes screenings. This involves taking a blood sample to check your fasting blood sugar level. ? If you are at a normal weight and have a low risk for diabetes, have this test once every three years after 49 years of age. ? If you are overweight and have a high risk for diabetes, consider being tested at a younger age or more often. Preventing infection Hepatitis B  If you have a higher risk for hepatitis B, you should be screened for this virus. You are considered at high risk for hepatitis B if: ? You were born in a country where hepatitis B is common. Ask your health care provider which countries  are considered high risk. ? Your parents were born in a high-risk country, and you have not been immunized against hepatitis B (hepatitis B vaccine). ? You have HIV or AIDS. ? You use needles to inject street drugs. ? You live with someone who has hepatitis B. ? You have had sex with someone who has hepatitis B. ? You get hemodialysis treatment. ? You take certain medicines for conditions, including cancer, organ transplantation, and autoimmune conditions.  Hepatitis C  Blood testing is recommended for: ? Everyone born from [birth year] through 1967. ? Anyone with known risk factors for hepatitis C.  Sexually transmitted infections (STIs)  You should be screened for sexually transmitted infections (STIs) including gonorrhea and chlamydia if: ? You are sexually active and are younger than 49 years of age. ? You are older than 49 years of age and your health care provider tells you that you are at risk for this type of infection. ? Your sexual activity has changed since you were last screened and you are at an increased risk for chlamydia or gonorrhea. Ask your health care provider if you are at risk.  If you do not have HIV, but are at risk, it may be recommended that you take a prescription medicine daily to prevent HIV infection. This is called pre-exposure prophylaxis (PrEP). You are considered at risk if: ? You are sexually active and do not regularly use condoms or know the HIV status of your partner(s). ? You take drugs by injection. ? You are sexually active with a partner who has HIV.  Talk with your health care provider about whether you are at high risk of being infected with HIV. If you choose to begin PrEP, you should first be tested for HIV. You should then be tested every 3 months for as long as you are taking PrEP. Pregnancy  If you are premenopausal and you may become pregnant, ask your health care provider about preconception counseling.  If you may become pregnant, take 400  to 800 micrograms (mcg) of folic acid every day.  If you want to prevent pregnancy, talk to your health care provider about birth control (contraception). Osteoporosis and menopause  Osteoporosis is a disease in which the bones lose minerals and strength with aging. This can result in serious bone fractures. Your risk for osteoporosis can be identified using a bone density scan.  If you are 67 years of age or older, or if you are at risk for osteoporosis and fractures, ask your health care provider if you should be screened.  Ask your health care provider whether you should take a calcium or vitamin D supplement to lower your risk for osteoporosis.  Menopause may have certain physical symptoms and risks.  Hormone replacement therapy may reduce some of these symptoms and risks. Talk  to your health care provider about whether hormone replacement therapy is right for you. Follow these instructions at home:  Schedule regular health, dental, and eye exams.  Stay current with your immunizations.  Do not use any tobacco products including cigarettes, chewing tobacco, or electronic cigarettes.  If you are pregnant, do not drink alcohol.  If you are breastfeeding, limit how much and how often you drink alcohol.  Limit alcohol intake to no more than 1 drink per day for nonpregnant women. One drink equals 12 ounces of beer, 5 ounces of wine, or 1 ounces of hard liquor.  Do not use street drugs.  Do not share needles.  Ask your health care provider for help if you need support or information about quitting drugs.  Tell your health care provider if you often feel depressed.  Tell your health care provider if you have ever been abused or do not feel safe at home. This information is not intended to replace advice given to you by your health care provider. Make sure you discuss any questions you have with your health care provider. Document Released: 06/10/2011 Document Revised: 05/02/2016  Document Reviewed: 08/29/2015 Elsevier Interactive Patient Education  2018 [employer]      Agustina Caroli, MD [facility]

## 2017-12-23 NOTE — Patient Instructions (Addendum)
   IF you received an x-ray today, you will receive an invoice from [facility]. Please contact [facility] at [phone] with questions or concerns regarding your invoice.   IF you received labwork today, you will receive an invoice from [facility]. Please contact [facility] at [phone] with questions or concerns regarding your invoice.   Our billing staff will not be able to assist you with questions regarding bills from these companies.  You will be contacted with the lab results as soon as they are available. The fastest way to get your results is to activate your My Chart account. Instructions are located on the last page of this paperwork. If you have not heard from us regarding the results in 2 weeks, please contact this office.     Health Maintenance, Female Adopting a healthy lifestyle and getting preventive care can go a long way to promote health and wellness. Talk with your health care provider about what schedule of regular examinations is right for you. This is a good chance for you to check in with your provider about disease prevention and staying healthy. In between checkups, there are plenty of things you can do on your own. Experts have done a lot of research about which lifestyle changes and preventive measures are most likely to keep you healthy. Ask your health care provider for more information. Weight and diet Eat a healthy diet  Be sure to include plenty of vegetables, fruits, low-fat dairy products, and lean protein.  Do not eat a lot of foods high in solid fats, added sugars, or salt.  Get regular exercise. This is one of the most important things you can do for your health. ? Most adults should exercise for at least 150 minutes each week. The exercise should increase your heart rate and make you sweat (moderate-intensity exercise). ? Most adults should also do strengthening exercises at least twice a week. This is in addition to the  moderate-intensity exercise.  Maintain a healthy weight  Body mass index (BMI) is a measurement that can be used to identify possible weight problems. It estimates body fat based on height and weight. Your health care provider can help determine your BMI and help you achieve or maintain a healthy weight.  For females 20 years of age and older: ? A BMI below 18.5 is considered underweight. ? A BMI of 18.5 to 24.9 is normal. ? A BMI of 25 to 29.9 is considered overweight. ? A BMI of 30 and above is considered obese.  Watch levels of cholesterol and blood lipids  You should start having your blood tested for lipids and cholesterol at 49 years of age, then have this test every 5 years.  You may need to have your cholesterol levels checked more often if: ? Your lipid or cholesterol levels are high. ? You are older than 50 years of age. ? You are at high risk for heart disease.  Cancer screening Lung Cancer  Lung cancer screening is recommended for adults 55-80 years old who are at high risk for lung cancer because of a history of smoking.  A yearly low-dose CT scan of the lungs is recommended for people who: ? Currently smoke. ? Have quit within the past 15 years. ? Have at least a 30-pack-year history of smoking. A pack year is smoking an average of one pack of cigarettes a day for 1 year.  Yearly screening should continue until it has been 15 years since you quit.  Yearly   screening should stop if you develop a health problem that would prevent you from having lung cancer treatment.  Breast Cancer  Practice breast self-awareness. This means understanding how your breasts normally appear and feel.  It also means doing regular breast self-exams. Let your health care provider know about any changes, no matter how small.  If you are in your 20s or 30s, you should have a clinical breast exam (CBE) by a health care provider every 1-3 years as part of a regular health exam.  If you  are 73 or older, have a CBE every year. Also consider having a breast X-ray (mammogram) every year.  If you have a family history of breast cancer, talk to your health care provider about genetic screening.  If you are at high risk for breast cancer, talk to your health care provider about having an MRI and a mammogram every year.  Breast cancer gene (BRCA) assessment is recommended for women who have family members with BRCA-related cancers. BRCA-related cancers include: ? Breast. ? Ovarian. ? Tubal. ? Peritoneal cancers.  Results of the assessment will determine the need for genetic counseling and BRCA1 and BRCA2 testing.  Cervical Cancer Your health care provider may recommend that you be screened regularly for cancer of the pelvic organs (ovaries, uterus, and vagina). This screening involves a pelvic examination, including checking for microscopic changes to the surface of your cervix (Pap test). You may be encouraged to have this screening done every 3 years, beginning at age 61.  For women ages 90-65, health care providers may recommend pelvic exams and Pap testing every 3 years, or they may recommend the Pap and pelvic exam, combined with testing for human papilloma virus (HPV), every 5 years. Some types of HPV increase your risk of cervical cancer. Testing for HPV may also be done on women of any age with unclear Pap test results.  Other health care providers may not recommend any screening for nonpregnant women who are considered low risk for pelvic cancer and who do not have symptoms. Ask your health care provider if a screening pelvic exam is right for you.  If you have had past treatment for cervical cancer or a condition that could lead to cancer, you need Pap tests and screening for cancer for at least 20 years after your treatment. If Pap tests have been discontinued, your risk factors (such as having a new sexual partner) need to be reassessed to determine if screening should  resume. Some women have medical problems that increase the chance of getting cervical cancer. In these cases, your health care provider may recommend more frequent screening and Pap tests.  Colorectal Cancer  This type of cancer can be detected and often prevented.  Routine colorectal cancer screening usually begins at 49 years of age and continues through 49 years of age.  Your health care provider may recommend screening at an earlier age if you have risk factors for colon cancer.  Your health care provider may also recommend using home test kits to check for hidden blood in the stool.  A small camera at the end of a tube can be used to examine your colon directly (sigmoidoscopy or colonoscopy). This is done to check for the earliest forms of colorectal cancer.  Routine screening usually begins at age 67.  Direct examination of the colon should be repeated every 5-10 years through 49 years of age. However, you may need to be screened more often if early forms of precancerous polyps  or small growths are found.  Skin Cancer  Check your skin from head to toe regularly.  Tell your health care provider about any new moles or changes in moles, especially if there is a change in a mole's shape or color.  Also tell your health care provider if you have a mole that is larger than the size of a pencil eraser.  Always use sunscreen. Apply sunscreen liberally and repeatedly throughout the day.  Protect yourself by wearing long sleeves, pants, a wide-brimmed hat, and sunglasses whenever you are outside.  Heart disease, diabetes, and high blood pressure  High blood pressure causes heart disease and increases the risk of stroke. High blood pressure is more likely to develop in: ? People who have blood pressure in the high end of the normal range (130-139/85-89 mm Hg). ? People who are overweight or obese. ? People who are African American.  If you are 18-39 years of age, have your blood  pressure checked every 3-5 years. If you are 40 years of age or older, have your blood pressure checked every year. You should have your blood pressure measured twice-once when you are at a hospital or clinic, and once when you are not at a hospital or clinic. Record the average of the two measurements. To check your blood pressure when you are not at a hospital or clinic, you can use: ? An automated blood pressure machine at a pharmacy. ? A home blood pressure monitor.  If you are between 55 years and 79 years old, ask your health care provider if you should take aspirin to prevent strokes.  Have regular diabetes screenings. This involves taking a blood sample to check your fasting blood sugar level. ? If you are at a normal weight and have a low risk for diabetes, have this test once every three years after 49 years of age. ? If you are overweight and have a high risk for diabetes, consider being tested at a younger age or more often. Preventing infection Hepatitis B  If you have a higher risk for hepatitis B, you should be screened for this virus. You are considered at high risk for hepatitis B if: ? You were born in a country where hepatitis B is common. Ask your health care provider which countries are considered high risk. ? Your parents were born in a high-risk country, and you have not been immunized against hepatitis B (hepatitis B vaccine). ? You have HIV or AIDS. ? You use needles to inject street drugs. ? You live with someone who has hepatitis B. ? You have had sex with someone who has hepatitis B. ? You get hemodialysis treatment. ? You take certain medicines for conditions, including cancer, organ transplantation, and autoimmune conditions.  Hepatitis C  Blood testing is recommended for: ? Everyone born from 1945 through 1965. ? Anyone with known risk factors for hepatitis C.  Sexually transmitted infections (STIs)  You should be screened for sexually transmitted  infections (STIs) including gonorrhea and chlamydia if: ? You are sexually active and are younger than 49 years of age. ? You are older than 49 years of age and your health care provider tells you that you are at risk for this type of infection. ? Your sexual activity has changed since you were last screened and you are at an increased risk for chlamydia or gonorrhea. Ask your health care provider if you are at risk.  If you do not have HIV, but are at risk,   it may be recommended that you take a prescription medicine daily to prevent HIV infection. This is called pre-exposure prophylaxis (PrEP). You are considered at risk if: ? You are sexually active and do not regularly use condoms or know the HIV status of your partner(s). ? You take drugs by injection. ? You are sexually active with a partner who has HIV.  Talk with your health care provider about whether you are at high risk of being infected with HIV. If you choose to begin PrEP, you should first be tested for HIV. You should then be tested every 3 months for as long as you are taking PrEP. Pregnancy  If you are premenopausal and you may become pregnant, ask your health care provider about preconception counseling.  If you may become pregnant, take 400 to 800 micrograms (mcg) of folic acid every day.  If you want to prevent pregnancy, talk to your health care provider about birth control (contraception). Osteoporosis and menopause  Osteoporosis is a disease in which the bones lose minerals and strength with aging. This can result in serious bone fractures. Your risk for osteoporosis can be identified using a bone density scan.  If you are 34 years of age or older, or if you are at risk for osteoporosis and fractures, ask your health care provider if you should be screened.  Ask your health care provider whether you should take a calcium or vitamin D supplement to lower your risk for osteoporosis.  Menopause may have certain physical  symptoms and risks.  Hormone replacement therapy may reduce some of these symptoms and risks. Talk to your health care provider about whether hormone replacement therapy is right for you. Follow these instructions at home:  Schedule regular health, dental, and eye exams.  Stay current with your immunizations.  Do not use any tobacco products including cigarettes, chewing tobacco, or electronic cigarettes.  If you are pregnant, do not drink alcohol.  If you are breastfeeding, limit how much and how often you drink alcohol.  Limit alcohol intake to no more than 1 drink per day for nonpregnant women. One drink equals 12 ounces of beer, 5 ounces of wine, or 1 ounces of hard liquor.  Do not use street drugs.  Do not share needles.  Ask your health care provider for help if you need support or information about quitting drugs.  Tell your health care provider if you often feel depressed.  Tell your health care provider if you have ever been abused or do not feel safe at home. This information is not intended to replace advice given to you by your health care provider. Make sure you discuss any questions you have with your health care provider. Document Released: 06/10/2011 Document Revised: 05/02/2016 Document Reviewed: 08/29/2015 Elsevier Interactive Patient Education  [employer].

## 2018-01-01 ENCOUNTER — Emergency Department (HOSPITAL_COMMUNITY)
Admission: EM | Admit: 2018-01-01 | Discharge: 2018-01-01 | Disposition: A | Discharge status: Home / Self Care | Payer: Medicare HMO | Attending: Emergency Medicine | Admitting: Emergency Medicine

## 2018-01-01 ENCOUNTER — Other Ambulatory Visit: Payer: Self-pay

## 2018-01-01 ENCOUNTER — Encounter (HOSPITAL_COMMUNITY): Payer: Self-pay

## 2018-01-01 DIAGNOSIS — Z8673 Personal history of transient ischemic attack (TIA), and cerebral infarction without residual deficits: Secondary | ICD-10-CM | POA: Diagnosis not present

## 2018-01-01 DIAGNOSIS — Z79899 Other long term (current) drug therapy: Secondary | ICD-10-CM | POA: Insufficient documentation

## 2018-01-01 DIAGNOSIS — E119 Type 2 diabetes mellitus without complications: Secondary | ICD-10-CM | POA: Diagnosis not present

## 2018-01-01 DIAGNOSIS — I10 Essential (primary) hypertension: Secondary | ICD-10-CM | POA: Diagnosis not present

## 2018-01-01 DIAGNOSIS — L299 Pruritus, unspecified: Secondary | ICD-10-CM | POA: Diagnosis not present

## 2018-01-01 MED ORDER — TORSEMIDE 20 MG PO TABS: 20.0000 mg | ORAL_TABLET | Freq: Every day | ORAL | 0 refills | Status: DC

## 2018-01-01 NOTE — Discharge Instructions (Signed)
Please take torsemide daily.  I have written you a prescription for this.  Schedule an appointment with your primary doctor for follow-up of your medical problems and medication management.  Return to the emergency department if you have numbness in which you lose sensation in your hands or feet, weakness in your hands or feet, have trouble speaking, or trouble seeing or have any new or worsening symptoms.

## 2018-01-01 NOTE — ED Notes (Signed)
Pt verbalized understanding discharge instructions and denies any further needs or questions at this time. VS stable, ambulatory and steady gait.   

## 2018-01-01 NOTE — ED Triage Notes (Signed)
Pt presents to the ed with complaints of thinking she was having a stroke. On this RN assessment the pt is complaining of itching and burning in both of her palms since waking up this AM at 0800. Pt reports "The other night in my sleep I felt like I was going to fade out so I called out for [facility] but I came back and woke up feeling fine." Pt denies any other complaints. No unilateral weakness or neuro deficits present.

## 2018-01-01 NOTE — ED Provider Notes (Signed)
[facility] EMERGENCY DEPARTMENT Provider Note   CSN: [id number] Arrival date & time: 01/01/18  2637     History   Chief Complaint Chief Complaint  Patient presents with  . Numbness in both hands    HPI Brandi Gomez is a 47 y.o. female.  HPI   Brandi Gomez is a 47 year old female with a history of bipolar 1 disorder, migraine headaches, hypertension, obesity, CVA (05/2017), NIDDM who presents to the emergency department for evaluation of itching and burning in bilateral hands.  Patient states that this began last night and has been persistent.  She states that bilateral palms feel "bent out of shape" and complains that they feel itchy.  She tried placing chamomile lotion on the hands which improved her symptoms some.  When asked specifically if she has numbness or tingling, patient denies this and states that she feels that her left ring finger is bent.  Denies loss of sensation or weakness.  Denies fevers, chills, headache, visual disturbance, dysarthria, dysphagia, chest pain, shortness of breath, abdominal pain, nausea/vomiting.  She is able to walk independently without difficulty.  States that she is out of her torsemide medication because she forgot to get it refilled.  Past Medical History:  Diagnosis Date  . Anemia   . Anxiety   . Bipolar 1 disorder ([place])   . Common migraine 05/19/2015  . Depression   . Hypertension   . Irritable bowel syndrome (IBS)   . Mild mental retardation   . Obesity   . Partial complex seizure disorder with intractable epilepsy ([place]) 05/12/2014  . Seizures ([place])    intractable, sz 08/23/17  . Sleep apnea   . Stroke ([place])   . Type II or unspecified type diabetes mellitus without mention of complication, not stated as uncontrolled     Patient Active Problem List   Diagnosis Date Noted  . Malodorous urine 10/08/2017  . Lower urinary tract symptoms (LUTS) 10/08/2017  . Acute UTI 10/08/2017  . Constipation 10/08/2017  .  History of recent stroke 08/06/2017  . Angioedema 06/03/2017  . OSA (obstructive sleep apnea)   . Cerebrovascular accident (CVA) ([place]) 05/20/2017  . Controlled type 2 diabetes mellitus without complication, without long-term current use of insulin ([place]) 05/08/2017  . Essential hypertension 07/27/2008  . Convulsions ([place]) 02/05/2007    Past Surgical History:  Procedure Laterality Date  . COLONOSCOPY     2012-normal , Dr Sharlett Iles  . ESOPHAGOGASTRODUODENOSCOPY     normal-Dr Patterson 2012  . LOOP RECORDER INSERTION N/A 05/30/2017   Procedure: Loop Recorder Insertion;  Surgeon: Constance Haw, MD;  Location: [facility];  Service: Cardiovascular;  Laterality: N/A;  . MYRINGOTOMY WITH TUBE PLACEMENT    . MYRINGOTOMY WITH TUBE PLACEMENT Right 11/05/2017   Procedure: MYRINGOTOMY WITH TUBE PLACEMENT;  Surgeon: Melissa Montane, MD;  Location: [facility];  Service: ENT;  Laterality: Right;  right T Tube placement  . NASAL SINUS SURGERY    . TEE WITHOUT CARDIOVERSION N/A 05/30/2017   Procedure: TRANSESOPHAGEAL ECHOCARDIOGRAM (TEE);  Surgeon: Acie Fredrickson Wonda Cheng, MD;  Location: [facility] ENDOSCOPY;  Service: Cardiovascular;  Laterality: N/A;    OB History    No data available       Home Medications    Prior to Admission medications   Medication Sig Start Date End Date Taking? Authorizing Provider  ACCU-CHEK SOFTCLIX LANCETS lancets Use as instructed 06/06/17   Horald Pollen, MD  alum & mag hydroxide-simeth (MYLANTA DOUBLE-STRENGTH) 858-850-27 MG/5ML suspension  Take 10 mLs by mouth every 6 (six) hours as needed for indigestion. 09/17/17   Forde Dandy, MD  aspirin 325 MG tablet Take 1 tablet (325 mg total) by mouth daily. 05/23/17   Barton Dubois, MD  atorvastatin (LIPITOR) 10 MG tablet Take 1 tablet (10 mg total) by mouth daily at 6 PM. 12/23/17 03/23/18  Sagardia, Ines Bloomer, MD  carvedilol (COREG) 3.125 MG tablet Take 1 tablet (3.125 mg total) by mouth 2 (two) times daily. 12/12/17 12/12/18   Horald Pollen, MD  dicyclomine (BENTYL) 10 MG capsule Take 1 capsule (10 mg total) by mouth 3 (three) times daily before meals. 11/04/17   Doran Stabler, MD  docusate sodium (COLACE) 100 MG capsule Take 1 capsule (100 mg total) by mouth every 12 (twelve) hours. 12/02/17   Khatri, Hina, PA-C  DULoxetine (CYMBALTA) 30 MG capsule Take 1 capsule (30 mg total) by mouth daily. 08/06/17 11/05/17  Horald Pollen, MD  famotidine (PEPCID) 20 MG tablet TAKE 1 TABLET BY MOUTH TWICE A DAY 06/16/17   Whiteheart, Cristal Ford, NP  glucose blood test strip Use as instructed 06/06/17   Horald Pollen, MD  ibuprofen (ADVIL,MOTRIN) 200 MG tablet Take 400 mg by mouth every 6 (six) hours as needed for headache or mild pain.    [provider]  levETIRAcetam (KEPPRA) 500 MG tablet Take 1 tablet (500 mg total) by mouth 2 (two) times daily. 11/03/17   Ward Givens, NP  magnesium citrate SOLN Take 1 Bottle by mouth once.    [provider]  nitrofurantoin, macrocrystal-monohydrate, (MACROBID) 100 MG capsule Take 1 capsule (100 mg total) by mouth 2 (two) times daily. 09/01/17   Tasia Catchings, Amy V, PA-C  phenytoin (DILANTIN) 100 MG ER capsule TAKE 2 CAPSULES IN THE MORNING AND 3 CAPSULES IN THE EVENING 11/28/17   [name], MD  sitaGLIPtin (JANUVIA) 50 MG tablet Take 1 tablet (50 mg total) by mouth daily. For control of blood sugar 05/08/17   Sagardia, Ines Bloomer, MD  torsemide (DEMADEX) 20 MG tablet Take 1 tablet (20 mg total) by mouth daily. 05/08/17   Horald Pollen, MD    Family History Family History  Problem Relation Age of Onset  . Diabetes Mother        passed away from accidental death  . Hypertension Mother   . Bipolar disorder Father   . Diabetes Daughter   . Leukemia Daughter   . Ovarian cancer Maternal Aunt     Social History Social History   Tobacco Use  . Smoking status: Never Smoker  . Smokeless tobacco: Never Used  Substance Use Topics  . Alcohol  use: No  . Drug use: No     Allergies   Amoxicillin; Lisinopril; and Hydrocodone   Review of Systems Review of Systems  Constitutional: Negative for chills, fatigue and fever.  Eyes: Negative for visual disturbance.  Respiratory: Negative for shortness of breath.   Cardiovascular: Negative for chest pain.  Gastrointestinal: Negative for abdominal pain, nausea and vomiting.  Musculoskeletal: Positive for arthralgias (itchy and burning in bilateral palms). Negative for gait problem and joint swelling.  Skin: Negative for color change, rash and wound.     Physical Exam Updated Vital Signs BP (!) 129/93 (BP Location: Left Arm)   Pulse 79   Temp 97.9 F (36.6 C) (Oral)   Resp 16   Ht 5\' 1"  (1.549 m)   Wt 113.9 kg (251 lb)   SpO2 98%  BMI 47.43 kg/m   Physical Exam  Constitutional: She is oriented to person, place, and time. She appears well-developed and well-nourished. No distress.  HENT:  Head: Normocephalic and atraumatic.  Mouth/Throat: Oropharynx is clear and moist. No oropharyngeal exudate.  Eyes: Conjunctivae and EOM are normal. Pupils are equal, round, and reactive to light. Right eye exhibits no discharge. Left eye exhibits no discharge.  Neck: Normal range of motion. Neck supple.  Cardiovascular: Normal rate, regular rhythm and intact distal pulses. Exam reveals no friction rub.  No murmur heard. Pulmonary/Chest: Effort normal and breath sounds normal. No stridor. No respiratory distress. She has no wheezes. She has no rales.  Abdominal: Soft. Bowel sounds are normal. There is no tenderness. There is no guarding.  Musculoskeletal:  Bilateral hands without erythema, edema, break in skin or warmth. Non-tender to palpation. Full finger and wrist ROM. Grip strength 5/5. Radial pulses 2+ bilaterally. Bilateral LE without tenderness or swelling.   Neurological: She is alert and oriented to person, place, and time. Coordination normal.  Mental Status:  Alert, oriented,  thought content appropriate, able to give a coherent history. Speech fluent without evidence of aphasia. Able to follow 2 step commands without difficulty.  Cranial Nerves:  II:  Peripheral visual fields grossly normal, pupils equal, round, reactive to light III,IV, VI: ptosis not present, extra-ocular motions intact bilaterally  V,VII: smile symmetric, facial light touch sensation equal VIII: hearing grossly normal to voice  X: uvula elevates symmetrically  XI: bilateral shoulder shrug symmetric and strong XII: midline tongue extension without fassiculations Motor:  Normal tone. 5/5 in upper and lower extremities bilaterally including strong and equal grip strength and dorsiflexion/plantar flexion Sensory: Pinprick and light touch normal in all extremities.  Deep Tendon Reflexes: 1+ and symmetric in the biceps and patella Cerebellar: normal finger-to-nose with bilateral upper extremities Gait: normal gait and balance  Skin: Skin is warm and dry. Capillary refill takes less than 2 seconds. She is not diaphoretic.  Psychiatric: She has a normal mood and affect. Her behavior is normal.  Nursing note and vitals reviewed.   ED Treatments / Results  Labs (all labs ordered are listed, but only abnormal results are displayed) Labs Reviewed - No data to display  EKG  EKG Interpretation  Date/Time:  Thursday January 01 2018 09:16:31 EST Ventricular Rate:  91 PR Interval:  184 QRS Duration: 76 QT Interval:  354 QTC Calculation: 435 R Axis:   92 Text Interpretation:  Normal sinus rhythm Rightward axis ST & T wave abnormality, consider anterolateral ischemia Abnormal ECG ST changes similar to october 2018 Confirmed by Merrily Pew [phone]) on 01/01/2018 9:23:34 AM       Radiology No results found.  Procedures Procedures (including critical care time)  Medications Ordered in ED Medications - No data to display   Initial Impression / Assessment and Plan / ED Course  I have  reviewed the triage vital signs and the nursing notes.  Pertinent labs & imaging results that were available during my care of the patient were reviewed by me and considered in my medical decision making (see chart for details).    Presents with bilateral hand itching. No warmth, erythema, rash or point tenderness in the hands or arms to suggest infection or allergic reaction. No neurological deficits on exam. Presentation non-concerning for acute stroke. Will provide refill of her torsemide and have counseled her to follow up with her PCP for further medication management. Discussed return precautions and patient agrees and voices understanding  to the above plan. She has no complaints prior to discharge.   Final Clinical Impressions(s) / ED Diagnoses   Final diagnoses:  Pruritus    ED Discharge Orders        Ordered    torsemide (DEMADEX) 20 MG tablet  Daily     01/01/18 1049       Bernarda Caffey 01/01/18 1934    Drenda Freeze, MD 01/02/18 [phone]

## 2018-01-12 ENCOUNTER — Other Ambulatory Visit: Payer: Self-pay

## 2018-01-12 ENCOUNTER — Emergency Department (HOSPITAL_COMMUNITY): Payer: Medicare HMO

## 2018-01-12 ENCOUNTER — Encounter (HOSPITAL_COMMUNITY): Payer: Self-pay | Admitting: Psychiatry

## 2018-01-12 ENCOUNTER — Ambulatory Visit (INDEPENDENT_AMBULATORY_CARE_PROVIDER_SITE_OTHER): Payer: Medicare HMO | Admitting: Psychiatry

## 2018-01-12 ENCOUNTER — Emergency Department (HOSPITAL_COMMUNITY)
Admission: EM | Admit: 2018-01-12 | Discharge: 2018-01-12 | Disposition: A | Discharge status: Home / Self Care | Payer: Medicare HMO | Attending: Emergency Medicine | Admitting: Emergency Medicine

## 2018-01-12 ENCOUNTER — Encounter (HOSPITAL_COMMUNITY): Payer: Self-pay | Admitting: Emergency Medicine

## 2018-01-12 VITALS — BP 138/80 | HR 89 | Ht 61.0 in | Wt 247.0 lb

## 2018-01-12 DIAGNOSIS — R519 Headache, unspecified: Secondary | ICD-10-CM

## 2018-01-12 DIAGNOSIS — E119 Type 2 diabetes mellitus without complications: Secondary | ICD-10-CM | POA: Insufficient documentation

## 2018-01-12 DIAGNOSIS — Z7984 Long term (current) use of oral hypoglycemic drugs: Secondary | ICD-10-CM | POA: Diagnosis not present

## 2018-01-12 DIAGNOSIS — F70 Mild intellectual disabilities: Secondary | ICD-10-CM | POA: Insufficient documentation

## 2018-01-12 DIAGNOSIS — Z7982 Long term (current) use of aspirin: Secondary | ICD-10-CM | POA: Diagnosis not present

## 2018-01-12 DIAGNOSIS — I6789 Other cerebrovascular disease: Secondary | ICD-10-CM | POA: Diagnosis not present

## 2018-01-12 DIAGNOSIS — I10 Essential (primary) hypertension: Secondary | ICD-10-CM | POA: Diagnosis not present

## 2018-01-12 DIAGNOSIS — R51 Headache: Secondary | ICD-10-CM | POA: Insufficient documentation

## 2018-01-12 DIAGNOSIS — I639 Cerebral infarction, unspecified: Secondary | ICD-10-CM

## 2018-01-12 DIAGNOSIS — Z79899 Other long term (current) drug therapy: Secondary | ICD-10-CM | POA: Diagnosis not present

## 2018-01-12 DIAGNOSIS — R479 Unspecified speech disturbances: Secondary | ICD-10-CM | POA: Diagnosis not present

## 2018-01-12 DIAGNOSIS — R4781 Slurred speech: Secondary | ICD-10-CM | POA: Diagnosis not present

## 2018-01-12 LAB — CBC WITH DIFFERENTIAL/PLATELET
BASOS ABS: 0 10*3/uL (ref 0.0–0.1)
BASOS PCT: 0 %
Eosinophils Absolute: 0.1 10*3/uL (ref 0.0–0.7)
Eosinophils Relative: 2 %
HEMATOCRIT: 34.2 % — AB (ref 36.0–46.0)
HEMOGLOBIN: 11.3 g/dL — AB (ref 12.0–15.0)
Lymphocytes Relative: 39 %
Lymphs Abs: 2.5 10*3/uL (ref 0.7–4.0)
MCH: 31 pg (ref 26.0–34.0)
MCHC: 33 g/dL (ref 30.0–36.0)
MCV: 93.7 fL (ref 78.0–100.0)
MONOS PCT: 5 %
Monocytes Absolute: 0.3 10*3/uL (ref 0.1–1.0)
NEUTROS ABS: 3.5 10*3/uL (ref 1.7–7.7)
NEUTROS PCT: 54 %
Platelets: 270 10*3/uL (ref 150–400)
RBC: 3.65 MIL/uL — AB (ref 3.87–5.11)
RDW: 12.8 % (ref 11.5–15.5)
WBC: 6.4 10*3/uL (ref 4.0–10.5)

## 2018-01-12 LAB — BASIC METABOLIC PANEL
ANION GAP: 10 (ref 5–15)
BUN: 13 mg/dL (ref 6–20)
CHLORIDE: 97 mmol/L — AB (ref 101–111)
CO2: 30 mmol/L (ref 22–32)
Calcium: 8.6 mg/dL — ABNORMAL LOW (ref 8.9–10.3)
Creatinine, Ser: 1.04 mg/dL — ABNORMAL HIGH (ref 0.44–1.00)
GFR calc non Af Amer: >60 mL/min (ref >60)
Glucose, Bld: 146 mg/dL — ABNORMAL HIGH (ref 65–99)
POTASSIUM: 3.4 mmol/L — AB (ref 3.5–5.1)
Sodium: 137 mmol/L (ref 135–145)

## 2018-01-12 NOTE — ED Notes (Signed)
Patient transported to MRI 

## 2018-01-12 NOTE — ED Notes (Signed)
Pt ambulatory to the restroom without difficulty.

## 2018-01-12 NOTE — ED Notes (Signed)
Patient transported to CT 

## 2018-01-12 NOTE — ED Notes (Signed)
ED Provider at bedside. 

## 2018-01-12 NOTE — Progress Notes (Signed)
Brandi Gomez presents today for psychiatric intake assessment.  During our first few minutes of meeting, it is immediately apparent that the patient is having difficulty speaking, slurring her speech.  She keeps grabbing at her head, noting that she has had a headache for the past day.  Her gait was notable for difficulty with balance.  Her husband is present and reports that this has been her status for the past 24 hours.  She has a past history of stroke.  I recommended immediate transport to the emergency department, and expressed my concern that she could be having another cerebrovascular incident.  She was agreeable to this, and husband is agreeable.  We will reschedule psychiatric intake assessment for later date.  - Spoke with 911 operator, ambulance on route

## 2018-01-12 NOTE — ED Notes (Signed)
Pt c.o cheat pressure while in MRI, when pt returned EKG done and given to EDP

## 2018-01-12 NOTE — ED Provider Notes (Signed)
[facility] EMERGENCY DEPARTMENT Provider Note   CSN: [id number] Arrival date & time: 01/12/18  1226     History   Chief Complaint Chief Complaint  Patient presents with  . Headache    HPI Brandi Gomez is a 47 y.o. female.  The history is provided by the patient and medical records. No language interpreter was used.  Headache     Brandi Gomez is a 47 y.o. female  with a PMH of prior stroke, DM2, bipolar disorder, seizure disorders who presents to the Emergency Department complaining of right-sided headache x 2 days. Patient states that headaches have been sharp and intermittent. She went to her psychiatrist appointment today where psychiatrist felt as if she was slurring her speech. With speech change and headache, he instructed her to come to ER. Patient does not feel as if she is slurring speech. She does report about a week ago, "trying to call Jesus but the words wouldn't come out". She reports that the next day, she was able to talk. She denies any visual changes, neck pain, upper or lower extremity weakness, numbness, tingling, trouble walking. No medications taken for symptoms.  Past Medical History:  Diagnosis Date  . Anemia   . Anxiety   . Bipolar 1 disorder ([place])   . Common migraine 05/19/2015  . Depression   . Diabetes mellitus, type II ([place])   . Hypertension   . Irritable bowel syndrome (IBS)   . Mild mental retardation   . Obesity   . Partial complex seizure disorder with intractable epilepsy ([place]) 05/12/2014  . Seizures ([place])    intractable, sz 08/23/17  . Sleep apnea   . Stroke ([place])   . Type II or unspecified type diabetes mellitus without mention of complication, not stated as uncontrolled     Patient Active Problem List   Diagnosis Date Noted  . Malodorous urine 10/08/2017  . Lower urinary tract symptoms (LUTS) 10/08/2017  . Acute UTI 10/08/2017  . Constipation 10/08/2017  . History of recent stroke 08/06/2017  .  Angioedema 06/03/2017  . OSA (obstructive sleep apnea)   . Cerebrovascular accident (CVA) ([place]) 05/20/2017  . Controlled type 2 diabetes mellitus without complication, without long-term current use of insulin ([place]) 05/08/2017  . Essential hypertension 07/27/2008  . Convulsions ([place]) 02/05/2007    Past Surgical History:  Procedure Laterality Date  . COLONOSCOPY     2012-normal , Dr Sharlett Iles  . ESOPHAGOGASTRODUODENOSCOPY     normal-Dr Patterson 2012  . LOOP RECORDER INSERTION N/A 05/30/2017   Procedure: Loop Recorder Insertion;  Surgeon: Constance Haw, MD;  Location: [facility];  Service: Cardiovascular;  Laterality: N/A;  . MYRINGOTOMY WITH TUBE PLACEMENT    . MYRINGOTOMY WITH TUBE PLACEMENT Right 11/05/2017   Procedure: MYRINGOTOMY WITH TUBE PLACEMENT;  Surgeon: Melissa Montane, MD;  Location: [facility];  Service: ENT;  Laterality: Right;  right T Tube placement  . NASAL SINUS SURGERY    . TEE WITHOUT CARDIOVERSION N/A 05/30/2017   Procedure: TRANSESOPHAGEAL ECHOCARDIOGRAM (TEE);  Surgeon: Acie Fredrickson Wonda Cheng, MD;  Location: [facility] ENDOSCOPY;  Service: Cardiovascular;  Laterality: N/A;    OB History    No data available       Home Medications    Prior to Admission medications   Medication Sig Start Date End Date Taking? Authorizing Provider  ACCU-CHEK SOFTCLIX LANCETS lancets Use as instructed 06/06/17   Horald Pollen, MD  aspirin 325 MG tablet Take 1 tablet (325 mg  total) by mouth daily. 05/23/17   Barton Dubois, MD  atorvastatin (LIPITOR) 10 MG tablet Take 1 tablet (10 mg total) by mouth daily at 6 PM. 12/23/17 03/23/18  Sagardia, Ines Bloomer, MD  carvedilol (COREG) 3.125 MG tablet Take 1 tablet (3.125 mg total) by mouth 2 (two) times daily. 12/12/17 12/12/18  Horald Pollen, MD  dicyclomine (BENTYL) 10 MG capsule Take 1 capsule (10 mg total) by mouth 3 (three) times daily before meals. 11/04/17   Doran Stabler, MD  docusate sodium (COLACE) 100 MG capsule Take 1  capsule (100 mg total) by mouth every 12 (twelve) hours. 12/02/17   Khatri, Hina, PA-C  DULoxetine (CYMBALTA) 30 MG capsule Take 1 capsule (30 mg total) by mouth daily. 08/06/17 01/12/18  Horald Pollen, MD  glucose blood test strip Use as instructed 06/06/17   Horald Pollen, MD  ibuprofen (ADVIL,MOTRIN) 200 MG tablet Take 400 mg by mouth every 6 (six) hours as needed for headache or mild pain.    [provider]  levETIRAcetam (KEPPRA) 500 MG tablet Take 1 tablet (500 mg total) by mouth 2 (two) times daily. 11/03/17   Ward Givens, NP  magnesium citrate SOLN Take 1 Bottle by mouth once.    [provider]  nitrofurantoin, macrocrystal-monohydrate, (MACROBID) 100 MG capsule Take 1 capsule (100 mg total) by mouth 2 (two) times daily. Patient not taking: Reported on 01/12/2018 09/01/17   Ok Edwards, PA-C  phenytoin (DILANTIN) 100 MG ER capsule TAKE 2 CAPSULES IN THE MORNING AND 3 CAPSULES IN THE EVENING 11/28/17   [name], MD  sitaGLIPtin (JANUVIA) 50 MG tablet Take 1 tablet (50 mg total) by mouth daily. For control of blood sugar 05/08/17   Sagardia, Ines Bloomer, MD  torsemide (DEMADEX) 20 MG tablet Take 1 tablet (20 mg total) by mouth daily. 05/08/17   Horald Pollen, MD    Family History Family History  Problem Relation Age of Onset  . Diabetes Mother        passed away from accidental death  . Hypertension Mother   . Bipolar disorder Father   . Diabetes Daughter   . Leukemia Daughter   . Ovarian cancer Maternal Aunt     Social History Social History   Tobacco Use  . Smoking status: Never Smoker  . Smokeless tobacco: Never Used  Substance Use Topics  . Alcohol use: No  . Drug use: No     Allergies   Amoxicillin; Lisinopril; and Hydrocodone   Review of Systems Review of Systems  Neurological: Positive for speech difficulty (Per psychiatrist, patient denies. ) and headaches.  All other systems reviewed and are  negative.    Physical Exam Updated Vital Signs BP 105/78 (BP Location: Right Arm)   Pulse 73   Temp 98.3 F (36.8 C)   Resp 14   Ht 5\' 1"  (1.549 m)   Wt 113.9 kg (251 lb)   SpO2 98%   BMI 47.43 kg/m   Physical Exam  Constitutional: She is oriented to person, place, and time. She appears well-developed and well-nourished. No distress.  HENT:  Head: Normocephalic and atraumatic.  Cardiovascular: Normal rate, regular rhythm and normal heart sounds.  No murmur heard. Pulmonary/Chest: Effort normal and breath sounds normal. No respiratory distress.  Abdominal: Soft. She exhibits no distension. There is no tenderness.  Musculoskeletal: Normal range of motion.  Neurological: She is alert and oriented to person, place, and time.  Alert, oriented, thought content appropriate,  able to give a coherent history. Speech is goal oriented, able to follow commands.  Cranial Nerves:  II:  Peripheral visual fields grossly normal, pupils equal, round, reactive to light III, IV, VI: EOM intact bilaterally, ptosis not present V,VII: smile symmetric, eyes kept closed tightly against resistance, facial light touch sensation equal VIII: hearing grossly normal IX, X: symmetric soft palate movement, uvula elevates symmetrically  XI: bilateral shoulder shrug symmetric and strong XII: midline tongue extension 5/5 muscle strength in upper and lower extremities bilaterally including strong and equal grip strength and dorsiflexion/plantar flexion Sensory to light touch normal in all four extremities.  Normal finger-to-nose and rapid alternating movements.  Skin: Skin is warm and dry.  Nursing note and vitals reviewed.    ED Treatments / Results  Labs (all labs ordered are listed, but only abnormal results are displayed) Labs Reviewed  CBC WITH DIFFERENTIAL/PLATELET - Abnormal; Notable for the following components:      Result Value   RBC 3.65 (*)    Hemoglobin 11.3 (*)    HCT 34.2 (*)    All  other components within normal limits  BASIC METABOLIC PANEL - Abnormal; Notable for the following components:   Potassium 3.4 (*)    Chloride 97 (*)    Glucose, Bld 146 (*)    Creatinine, Ser 1.04 (*)    Calcium 8.6 (*)    All other components within normal limits    EKG  EKG Interpretation None      ED ECG REPORT   Date: 01/12/2018  Rate: 72  Rhythm: normal sinus rhythm  QRS Axis: normal  Intervals: normal  ST/T Wave abnormalities: nonspecific T wave changes  Conduction Disutrbances:none  Narrative Interpretation:   I have personally reviewed the EKG tracing and agree with the computerized printout as noted.  Radiology Ct Head Wo Contrast  Result Date: 01/12/2018 CLINICAL DATA:  New right-sided headache with slurred speech. EXAM: CT HEAD WITHOUT CONTRAST TECHNIQUE: Contiguous axial images were obtained from the base of the skull through the vertex without intravenous contrast. COMPARISON:  08/23/2017 FINDINGS: Brain: High right frontal low-density correlating with infarct on 2018 brain MR. No acute or interval infarct by CT. No hemorrhage, hydrocephalus, or masslike finding. Vascular: No hyperdense vessel or unexpected calcification. Skull: Negative Sinuses/Orbits: Partial left mastoid opacification that is chronic and incidental. IMPRESSION: 1. No acute finding or change from 08/23/2017. 2. Small remote right frontal infarct. Electronically Signed   By: Monte Fantasia M.D.   On: 01/12/2018 14:37   [name]  Result Date: 01/12/2018 CLINICAL DATA:  47 y/o  F; right-sided headache and slurred speech. EXAM: MRI HEAD WITHOUT CONTRAST TECHNIQUE: Multiplanar, multiecho pulse sequences of the brain and surrounding structures were obtained without intravenous contrast. COMPARISON:  01/12/2018 CT head.  05/25/2017 MRI of the head. FINDINGS: Brain: No acute infarction, hemorrhage, hydrocephalus, extra-axial collection or mass lesion. Stable small chronic cortical infarction in  the right posterior frontal lobe. Stable mild chronic microvascular ischemic changes and parenchymal volume loss of the brain. Vascular: Normal flow voids. Skull and upper cervical spine: Normal marrow signal. Sinuses/Orbits: Negative. Other: None. IMPRESSION: 1. No acute intracranial abnormality identified. 2. Stable small chronic cortical infarction in right posterior frontal lobe. Stable mild chronic microvascular ischemic changes and parenchymal volume loss brain. Electronically Signed   By: Kristine Garbe M.D.   On: 01/12/2018 18:31    Procedures Procedures (including critical care time)  Medications Ordered in ED Medications - No data to display  Initial Impression / Assessment and Plan / ED Course  I have reviewed the triage vital signs and the nursing notes.  Pertinent labs & imaging results that were available during my care of the patient were reviewed by me and considered in my medical decision making (see chart for details).    Brandi Gomez is a 47 y.o. female who presents to ED from psychiatrist for concerns of slurred speech. Patient endorses right-sided headache as well. CN 2-12 grossly intact. Strength and sensation equal and intact. Patient does have hx of prior stroke. Will obtain labs and CT head to further evaluate.  Labs reviewed and baseline.  CT head obtained with no acute findings.  Neurology, Dr. Leonel Ramsay, consulted who recommends obtaining MRI.  If negative, can discharge to home.  MRI obtained with no acute findings.  Upon returning to from MRI, patient informed nursing staff of chest pain. EKG taken by nursing staff and reviewed with attending, Dr. Marcha Dutton. EKG reassuring.  Patient reevaluated.  She reports that her chest became very hot in the MRI machine.  She now reports chest pain has resolved now that she is out of MRI. Evaluation does not show pathology that would require ongoing emergent intervention or inpatient treatment. Will have patient follow  up with pcp. Reasons to return to ER discussed and all questions answered.   Final Clinical Impressions(s) / ED Diagnoses   Final diagnoses:  Bad headache    ED Discharge Orders    None       Ward, Ozella Almond, PA-C 01/12/18 1900    Pixie Casino, MD 01/12/18 1902

## 2018-01-12 NOTE — ED Triage Notes (Addendum)
Pt arrives via EMS from her pain clinic. She was seen there today and they were concerned because of a new right sided headache and slurred speech. For EMS no neuro deficits were noted. VAN neg. VSS. Alert, oriented x4. 20g l hand. LSN 24 hours ago

## 2018-01-12 NOTE — Discharge Instructions (Signed)
It was my pleasure taking care of you today!   Fortunately, your CT and MRI were very reassuring today.  Please follow up with your primary care doctor for further discussion of your hospital visit today.   Return to ER for new or worsening symptoms, any additional concerns.

## 2018-01-16 ENCOUNTER — Other Ambulatory Visit: Payer: Self-pay | Admitting: *Deleted

## 2018-01-16 NOTE — Patient Outreach (Signed)
[facility]) Care Management  01/16/2018  Brandi Gomez 06/23/1971 [id number]   RN Health Coach attempted #1 screenitngf outreach call to patient.  Patient was unavailable. HIPPA compliance voicemail message left with return callback number.  Plan: RN will call patient again within 10 business days.  [place] [phone]

## 2018-01-19 ENCOUNTER — Other Ambulatory Visit: Payer: Self-pay | Admitting: *Deleted

## 2018-01-19 NOTE — Patient Outreach (Addendum)
[facility]) Care Management  01/19/2018  Brandi Gomez 1971-10-22 [id number]   [place] attempted #2 screening  outreach call to patient.  Patient was unavailable. HIPPA compliance voicemail message left with return callback number.  Plan: Unsuccessful outreach letter sent RN will call patient again within 10 business days.  [name] [phone]

## 2018-01-30 ENCOUNTER — Other Ambulatory Visit: Payer: Self-pay | Admitting: *Deleted

## 2018-01-30 NOTE — Patient Outreach (Signed)
[facility] [facility]) Care Management  01/30/2018  Brandi Gomez Oct 30, 1971 [id number]   [place] attempted #3 outreach call to patient.  Patient picked up phone and said whose calling. RN identified self. Patient hung up phone.   Plan: RN will send CMA closure.  [place] Management [phone]

## 2018-02-06 ENCOUNTER — Ambulatory Visit: Payer: Medicare HMO | Admitting: Emergency Medicine

## 2018-02-10 ENCOUNTER — Encounter: Payer: Self-pay | Admitting: Emergency Medicine

## 2018-02-10 ENCOUNTER — Ambulatory Visit (INDEPENDENT_AMBULATORY_CARE_PROVIDER_SITE_OTHER): Payer: Medicare HMO | Admitting: Emergency Medicine

## 2018-02-10 ENCOUNTER — Other Ambulatory Visit: Payer: Self-pay

## 2018-02-10 VITALS — BP 120/62 | HR 84 | Temp 98.5°F | Resp 16 | Ht 61.0 in | Wt 256.2 lb

## 2018-02-10 DIAGNOSIS — N390 Urinary tract infection, site not specified: Secondary | ICD-10-CM | POA: Diagnosis not present

## 2018-02-10 DIAGNOSIS — R8290 Unspecified abnormal findings in urine: Secondary | ICD-10-CM | POA: Diagnosis not present

## 2018-02-10 DIAGNOSIS — B369 Superficial mycosis, unspecified: Secondary | ICD-10-CM

## 2018-02-10 DIAGNOSIS — E11628 Type 2 diabetes mellitus with other skin complications: Secondary | ICD-10-CM | POA: Diagnosis not present

## 2018-02-10 DIAGNOSIS — E119 Type 2 diabetes mellitus without complications: Secondary | ICD-10-CM

## 2018-02-10 DIAGNOSIS — E1165 Type 2 diabetes mellitus with hyperglycemia: Secondary | ICD-10-CM | POA: Diagnosis not present

## 2018-02-10 DIAGNOSIS — N898 Other specified noninflammatory disorders of vagina: Secondary | ICD-10-CM | POA: Insufficient documentation

## 2018-02-10 DIAGNOSIS — R5383 Other fatigue: Secondary | ICD-10-CM

## 2018-02-10 LAB — POCT URINALYSIS DIP (MANUAL ENTRY)
Bilirubin, UA: NEGATIVE
GLUCOSE UA: NEGATIVE mg/dL
NITRITE UA: NEGATIVE
RBC UA: NEGATIVE
Spec Grav, UA: 1.02 (ref 1.010–1.025)
Urobilinogen, UA: 0.2 E.U./dL
pH, UA: 6 (ref 5.0–8.0)

## 2018-02-10 LAB — GLUCOSE, POCT (MANUAL RESULT ENTRY): POC Glucose: 188 mg/dl — AB (ref 70–99)

## 2018-02-10 LAB — POCT GLYCOSYLATED HEMOGLOBIN (HGB A1C): Hemoglobin A1C: 6.6

## 2018-02-10 MED ORDER — CLOTRIMAZOLE-BETAMETHASONE 1-0.05 % EX CREA: 1.0000 "application " | TOPICAL_CREAM | Freq: Two times a day (BID) | CUTANEOUS | 2 refills | Status: AC

## 2018-02-10 MED ORDER — METFORMIN HCL 500 MG PO TABS: 500.0000 mg | ORAL_TABLET | Freq: Two times a day (BID) | ORAL | 3 refills | Status: DC

## 2018-02-10 MED ORDER — CIPROFLOXACIN HCL 500 MG PO TABS: 500.0000 mg | ORAL_TABLET | Freq: Two times a day (BID) | ORAL | 0 refills | Status: AC

## 2018-02-10 NOTE — Assessment & Plan Note (Signed)
Urine culture last year showed 2 different bacteria including Klebsiella and E. coli.  Both sensitive to Cipro but resistant to several other antibiotics.  Will start Cipro 500 mg twice a day for 7 days.

## 2018-02-10 NOTE — Progress Notes (Signed)
Brandi Gomez 47 y.o.   Chief Complaint  Patient presents with  . Rash    under arms and breast x 2 weeks  . vaginal odor    x 2 weeks    HISTORY OF PRESENT ILLNESS: This is a 47 y.o. female complaining of: #1 rash to under arms and skin under both breasts for 2 weeks #2 smell to the urine for 2 weeks.  Denies vaginal discharge.  Thinks she has a UTI not a vaginal infection. #3 feels tired. Diabetic.  Noncompliant with testing.  Lab Results  Component Value Date   HGBA1C 7.3 (H) 11/05/2017   Lab Results  Component Value Date   POCGLU 159 (A) 10/08/2017   POCGLU 130 (A) 07/01/2015   POCGLU 119 (A) 03/14/2015   Lab Results  Component Value Date   CREATININE 1.04 (H) 01/12/2018   BUN 13 01/12/2018   NA 137 01/12/2018   K 3.4 (L) 01/12/2018   CL 97 (L) 01/12/2018   CO2 30 01/12/2018   GFR >60 4 weeks ago and 3 months ago.  HPI   Prior to Admission medications   Medication Sig Start Date End Date Taking? Authorizing Provider  aspirin 325 MG tablet Take 1 tablet (325 mg total) by mouth daily. 05/23/17  Yes Barton Dubois, MD  atorvastatin (LIPITOR) 10 MG tablet Take 1 tablet (10 mg total) by mouth daily at 6 PM. 12/23/17 03/23/18 Yes Mallika Sanmiguel, Ines Bloomer, MD  carvedilol (COREG) 3.125 MG tablet Take 1 tablet (3.125 mg total) by mouth 2 (two) times daily. 12/12/17 12/12/18 Yes Daymon Hora, Ines Bloomer, MD  dicyclomine (BENTYL) 10 MG capsule Take 1 capsule (10 mg total) by mouth 3 (three) times daily before meals. 11/04/17  Yes Danis, Kirke Corin, MD  docusate sodium (COLACE) 100 MG capsule Take 1 capsule (100 mg total) by mouth every 12 (twelve) hours. 12/02/17  Yes Khatri, Hina, PA-C  ibuprofen (ADVIL,MOTRIN) 200 MG tablet Take 400 mg by mouth every 6 (six) hours as needed for headache or mild pain.   Yes [provider]  levETIRAcetam (KEPPRA) 500 MG tablet Take 1 tablet (500 mg total) by mouth 2 (two) times daily. 11/03/17  Yes Ward Givens, NP  phenytoin  (DILANTIN) 100 MG ER capsule TAKE 2 CAPSULES IN THE MORNING AND 3 CAPSULES IN THE EVENING 11/28/17  Yes [name], MD  sitaGLIPtin (JANUVIA) 50 MG tablet Take 1 tablet (50 mg total) by mouth daily. For control of blood sugar 05/08/17  Yes Kalyssa Anker, Ines Bloomer, MD  torsemide (DEMADEX) 20 MG tablet Take 1 tablet (20 mg total) by mouth daily. 05/08/17  Yes Syndi Pua, Ines Bloomer, MD  ACCU-CHEK [facility] LANCETS lancets Use as instructed 06/06/17   Horald Pollen, MD  DULoxetine (CYMBALTA) 30 MG capsule Take 1 capsule (30 mg total) by mouth daily. 08/06/17 01/12/18  Horald Pollen, MD  glucose blood test strip Use as instructed 06/06/17   Horald Pollen, MD  magnesium citrate SOLN Take 1 Bottle by mouth once.    [provider]  nitrofurantoin, macrocrystal-monohydrate, (MACROBID) 100 MG capsule Take 1 capsule (100 mg total) by mouth 2 (two) times daily. Patient not taking: Reported on 01/12/2018 09/01/17   Ok Edwards, PA-C    Allergies  Allergen Reactions  . Amoxicillin Itching    Has patient had a PCN reaction causing immediate rash, facial/tongue/throat swelling, SOB or lightheadedness with hypotension: yes Has patient had a PCN reaction causing severe rash involving mucus membranes or skin  necrosis: no Has patient had a PCN reaction that required hospitalization: no Has patient had a PCN reaction occurring within the last 10 years: yes If all of the above answers are "NO", then may proceed with Cephalosporin use.  Marland Kitchen Lisinopril Swelling    Angioedema   . Hydrocodone Other (See Comments)    Depressed     Patient Active Problem List   Diagnosis Date Noted  . Malodorous urine 10/08/2017  . Lower urinary tract symptoms (LUTS) 10/08/2017  . Acute UTI 10/08/2017  . Constipation 10/08/2017  . History of recent stroke 08/06/2017  . Angioedema 06/03/2017  . OSA (obstructive sleep apnea)   . Cerebrovascular accident (CVA) ([place]) 05/20/2017  . Controlled type 2  diabetes mellitus without complication, without long-term current use of insulin ([place]) 05/08/2017  . Essential hypertension 07/27/2008  . Convulsions ([place]) 02/05/2007    Past Medical History:  Diagnosis Date  . Anemia   . Anxiety   . Bipolar 1 disorder ([place])   . Common migraine 05/19/2015  . Depression   . Diabetes mellitus, type II ([place])   . Hypertension   . Irritable bowel syndrome (IBS)   . Mild mental retardation   . Obesity   . Partial complex seizure disorder with intractable epilepsy ([place]) 05/12/2014  . Seizures ([place])    intractable, sz 08/23/17  . Sleep apnea   . Stroke ([place])   . Type II or unspecified type diabetes mellitus without mention of complication, not stated as uncontrolled     Past Surgical History:  Procedure Laterality Date  . COLONOSCOPY     2012-normal , Dr Sharlett Iles  . ESOPHAGOGASTRODUODENOSCOPY     normal-Dr Patterson 2012  . LOOP RECORDER INSERTION N/A 05/30/2017   Procedure: Loop Recorder Insertion;  Surgeon: Constance Haw, MD;  Location: [facility];  Service: Cardiovascular;  Laterality: N/A;  . MYRINGOTOMY WITH TUBE PLACEMENT    . MYRINGOTOMY WITH TUBE PLACEMENT Right 11/05/2017   Procedure: MYRINGOTOMY WITH TUBE PLACEMENT;  Surgeon: Melissa Montane, MD;  Location: [facility];  Service: ENT;  Laterality: Right;  right T Tube placement  . NASAL SINUS SURGERY    . TEE WITHOUT CARDIOVERSION N/A 05/30/2017   Procedure: TRANSESOPHAGEAL ECHOCARDIOGRAM (TEE);  Surgeon: Acie Fredrickson Wonda Cheng, MD;  [facility] ENDOSCOPY;  Service: Cardiovascular;  Laterality: N/A;    Social History   Socioeconomic History  . Marital status: Single    Spouse name: Brandi Gomez  . Number of children: 3  . Years of education: 42  . Highest education level: Not on file  Social Needs  . Financial resource strain: Not on file  . Food insecurity - worry: Not on file  . Food insecurity - inability: Not on file  . Transportation needs - medical: Not on file  . Transportation  needs - non-medical: Not on file  Occupational History  . Occupation: disabled    Employer: DISABLED  Tobacco Use  . Smoking status: Never Smoker  . Smokeless tobacco: Never Used  Substance and Sexual Activity  . Alcohol use: No  . Drug use: No  . Sexual activity: No  Other Topics Concern  . Not on file  Social History Narrative   Patient lives at home with daughter.    Patient has 3 children.    Patient is right handed.    Patient has a high school education.    Patient is on disability   Patient drinks 2 cups of caffeine daily.    Family History  Problem  Relation Age of Onset  . Diabetes Mother        passed away from accidental death  . Hypertension Mother   . Bipolar disorder Father   . Diabetes Daughter   . Leukemia Daughter   . Ovarian cancer Maternal Aunt      Review of Systems  Constitutional: Positive for malaise/fatigue. Negative for chills and fever.  HENT: Negative.  Negative for sore throat.   Eyes: Negative.  Negative for discharge and redness.  Respiratory: Negative.  Negative for cough and shortness of breath.   Cardiovascular: Negative.  Negative for chest pain and palpitations.  Gastrointestinal: Negative.  Negative for abdominal pain, diarrhea, nausea and vomiting.  Genitourinary: Positive for dysuria and frequency.  Musculoskeletal: Negative for back pain, myalgias and neck pain.  Skin: Positive for itching and rash.  Neurological: Positive for weakness. Negative for dizziness, focal weakness, loss of consciousness and headaches.  Endo/Heme/Allergies: Negative.   All other systems reviewed and are negative.   Vitals:   02/10/18 1003  BP: 120/62  Pulse: 84  Resp: 16  Temp: 98.5 F (36.9 C)  SpO2: 97%    Physical Exam  Constitutional: She is oriented to person, place, and time. She appears well-developed.  Obese  HENT:  Head: Normocephalic and atraumatic.  Eyes: EOM are normal. Pupils are equal, round, and reactive to light.  Neck:  Normal range of motion. Neck supple.  Cardiovascular: Normal rate and regular rhythm.  Pulmonary/Chest: Effort normal and breath sounds normal.  Abdominal: Soft. She exhibits no distension. There is no tenderness.  Musculoskeletal: Normal range of motion.  Neurological: She is alert and oriented to person, place, and time. No sensory deficit. She exhibits normal muscle tone.  Skin: Skin is warm and dry. Capillary refill takes less than 2 seconds.  Positive fungal rash to both axillary areas and also in both areas under her breasts.  Psychiatric: She has a normal mood and affect. Her behavior is normal.  Vitals reviewed.  Results for orders placed or performed in visit on 02/10/18 (from the past 24 hour(s))  POCT urinalysis dipstick     Status: Abnormal   Collection Time: 02/10/18 10:20 AM  Result Value Ref Range   Color, UA yellow yellow   Clarity, UA clear clear   Glucose, UA negative negative mg/dL   Bilirubin, UA negative negative   Ketones, POC UA trace (5) (A) negative mg/dL   Spec Grav, UA 1.020 1.010 - 1.025   Blood, UA negative negative   pH, UA 6.0 5.0 - 8.0   Protein Ur, POC trace (A) negative mg/dL   Urobilinogen, UA 0.2 0.2 or 1.0 E.U./dL   Nitrite, UA Negative Negative   Leukocytes, UA Trace (A) Negative  POCT glucose (manual entry)     Status: Abnormal   Collection Time: 02/10/18 10:48 AM  Result Value Ref Range   POC Glucose 188 (A) 70 - 99 mg/dl  POCT glycosylated hemoglobin (Hb A1C)     Status: None   Collection Time: 02/10/18 10:54 AM  Result Value Ref Range   Hemoglobin A1C 6.6    Controlled type 2 diabetes mellitus without complication, without long-term current use of insulin (HCC) Spot glucose high.  Hemoglobin A1c still outside normal range but better than last.  Patient taking Januvia.  Normal renal function.  Last set of labs.  Will start Metformin 500 mg twice a day.  Her skin fungal infection most likely related to her diabetes.  Will reevaluate in 3  months.  Fungal skin infection We will start topical treatment with Lotrisone twice a day for 10 days.  Acute UTI Urine culture last year showed 2 different bacteria including Klebsiella and E. coli.  Both sensitive to Cipro but resistant to several other antibiotics.  Will start Cipro 500 mg twice a day for 7 days.  Tiredness May be a number of things.  Could be related to infections plus uncontrolled diabetes.   A total of 40 minutes was spent in the room with the patient, greater than 50% of which was in counseling/coordination of care.  ASSESSMENT & PLAN: Brandi Gomez was seen today for rash and vaginal odor.  Diagnoses and all orders for this visit:  Fungal skin infection -     clotrimazole-betamethasone (LOTRISONE) cream; Apply 1 application topically 2 (two) times daily for 10 days.  Controlled type 2 diabetes mellitus without complication, without long-term current use of insulin (HCC) -     POCT glycosylated hemoglobin (Hb A1C) -     POCT glucose (manual entry) -     metFORMIN (GLUCOPHAGE) 500 MG tablet; Take 1 tablet (500 mg total) by mouth 2 (two) times daily with a meal.  Acute UTI -     Urine Culture -     ciprofloxacin (CIPRO) 500 MG tablet; Take 1 tablet (500 mg total) by mouth 2 (two) times daily for 7 days.  Malodorous urine -     POCT urinalysis dipstick  Tiredness -     TSH    Patient Instructions       IF you received an x-ray today, you will receive an invoice from [facility]. Please contact [facility] at [phone] with questions or concerns regarding your invoice.   IF you received labwork today, you will receive an invoice from [place]. Please contact [facility] at [phone] with questions or concerns regarding your invoice.   Our billing staff will not be able to assist you with questions regarding bills from these companies.  You will be contacted with the lab results as soon as they are available. The fastest way to get  your results is to activate your My Chart account. Instructions are located on the last page of this paperwork. If you have not heard from [place] regarding the results in 2 weeks, please contact this office.     Urinary Tract Infection, Adult A urinary tract infection (UTI) is an infection of any part of the urinary tract. The urinary tract includes the:  Kidneys.  Ureters.  Bladder.  Urethra.  These organs make, store, and get rid of pee (urine) in the body. Follow these instructions at home:  Take over-the-counter and prescription medicines only as told by your doctor.  If you were prescribed an antibiotic medicine, take it as told by your doctor. Do not stop taking the antibiotic even if you start to feel better.  Avoid the following drinks: ? Alcohol. ? Caffeine. ? Tea. ? Carbonated drinks.  Drink enough fluid to keep your pee clear or pale yellow.  Keep all follow-up visits as told by your doctor. This is important.  Make sure to: ? Empty your bladder often and completely. Do not to hold pee for long periods of time. ? Empty your bladder before and after sex. ? Wipe from front to back after a bowel movement if you are female. Use each tissue one time when you wipe. Contact a doctor if:  You have back pain.  You have a fever.  You feel sick to your stomach (  nauseous).  You throw up (vomit).  Your symptoms do not get better after 3 days.  Your symptoms go away and then come back. Get help right away if:  You have very bad back pain.  You have very bad lower belly (abdominal) pain.  You are throwing up and cannot keep down any medicines or water. This information is not intended to replace advice given to you by your health care provider. Make sure you discuss any questions you have with your health care provider. Document Released: 05/13/2008 Document Revised: 05/02/2016 Document Reviewed: 10/16/2015 Elsevier Interactive Patient Education  2018 [employer].  Body Ringworm Body ringworm is an infection of the skin that often causes a ring-shaped rash. Body ringworm can affect any part of your skin. It can spread easily to others. Body ringworm is also called tinea corporis. What are the causes? This condition is caused by funguses called dermatophytes. The condition develops when these funguses grow out of control on the skin. You can get this condition if you touch a person or animal that has it. You can also get it if you share clothing, bedding, towels, or any other object with an infected person or pet. What increases the risk? This condition is more likely to develop in:  Athletes who often make skin-to-skin contact with other athletes, such as wrestlers.  People who share equipment and mats.  People with a weakened immune system.  What are the signs or symptoms? Symptoms of this condition include:  Itchy, raised red spots and bumps.  Red scaly patches.  A ring-shaped rash. The rash may have: ? A clear center. ? Scales or red bumps at its center. ? Redness near its borders. ? Dry and scaly skin on or around it.  How is this diagnosed? This condition can usually be diagnosed with a skin exam. A skin scraping may be taken from the affected area and examined under a microscope to see if the fungus is present. How is this treated? This condition may be treated with:  An antifungal cream or ointment.  An antifungal shampoo.  Antifungal medicines. These may be prescribed if your ringworm is severe, keeps coming back, or lasts a long time.  Follow these instructions at home:  Take over-the-counter and prescription medicines only as told by your health care provider.  If you were given an antifungal cream or ointment: ? Use it as told by your health care provider. ? Wash the infected area and dry it completely before applying the cream or ointment.  If you were given an antifungal shampoo: ? Use it as told by your  health care provider. ? Leave the shampoo on your body for 3-5 minutes before rinsing.  While you have a rash: ? Wear loose clothing to stop clothes from rubbing and irritating it. ? Wash or change your bed sheets every night.  If your pet has the same infection, take your pet to see a Animal nutritionist. How is this prevented?  Practice good hygiene.  Wear sandals or shoes in public places and showers.  Do not share personal items with others.  Avoid touching red patches of skin on other people.  Avoid touching pets that have bald spots.  If you touch an animal that has a bald spot, wash your hands. Contact a health care provider if:  Your rash continues to spread after 7 days of treatment.  Your rash is not gone in 4 weeks.  The area around your rash gets red, warm,  tender, and swollen. This information is not intended to replace advice given to you by your health care provider. Make sure you discuss any questions you have with your health care provider. Document Released: 11/22/2000 Document Revised: 05/02/2016 Document Reviewed: 09/21/2015 Elsevier Interactive Patient Education  2018 Elsevier Inc.      Agustina Caroli, MD [facility]

## 2018-02-10 NOTE — Assessment & Plan Note (Signed)
May be a number of things.  Could be related to infections plus uncontrolled diabetes.

## 2018-02-10 NOTE — Assessment & Plan Note (Signed)
We will start topical treatment with Lotrisone twice a day for 10 days.

## 2018-02-10 NOTE — Patient Instructions (Addendum)
IF you received an x-ray today, you will receive an invoice from [facility]. Please contact [facility] at [phone] with questions or concerns regarding your invoice.   IF you received labwork today, you will receive an invoice from [facility]. Please contact [facility] at [phone] with questions or concerns regarding your invoice.   Our billing staff will not be able to assist you with questions regarding bills from these companies.  You will be contacted with the lab results as soon as they are available. The fastest way to get your results is to activate your My Chart account. Instructions are located on the last page of this paperwork. If you have not heard from [place] regarding the results in 2 weeks, please contact this office.     Urinary Tract Infection, Adult A urinary tract infection (UTI) is an infection of any part of the urinary tract. The urinary tract includes the:  Kidneys.  Ureters.  Bladder.  Urethra.  These organs make, store, and get rid of pee (urine) in the body. Follow these instructions at home:  Take over-the-counter and prescription medicines only as told by your doctor.  If you were prescribed an antibiotic medicine, take it as told by your doctor. Do not stop taking the antibiotic even if you start to feel better.  Avoid the following drinks: ? Alcohol. ? Caffeine. ? Tea. ? Carbonated drinks.  Drink enough fluid to keep your pee clear or pale yellow.  Keep all follow-up visits as told by your doctor. This is important.  Make sure to: ? Empty your bladder often and completely. Do not to hold pee for long periods of time. ? Empty your bladder before and after sex. ? Wipe from front to back after a bowel movement if you are female. Use each tissue one time when you wipe. Contact a doctor if:  You have back pain.  You have a fever.  You feel sick to your stomach (nauseous).  You throw up (vomit).  Your symptoms do  not get better after 3 days.  Your symptoms go away and then come back. Get help right away if:  You have very bad back pain.  You have very bad lower belly (abdominal) pain.  You are throwing up and cannot keep down any medicines or water. This information is not intended to replace advice given to you by your health care provider. Make sure you discuss any questions you have with your health care provider. Document Released: 05/13/2008 Document Revised: 05/02/2016 Document Reviewed: 10/16/2015 Elsevier Interactive Patient Education  2018 [employer].  Body Ringworm Body ringworm is an infection of the skin that often causes a ring-shaped rash. Body ringworm can affect any part of your skin. It can spread easily to others. Body ringworm is also called tinea corporis. What are the causes? This condition is caused by funguses called dermatophytes. The condition develops when these funguses grow out of control on the skin. You can get this condition if you touch a person or animal that has it. You can also get it if you share clothing, bedding, towels, or any other object with an infected person or pet. What increases the risk? This condition is more likely to develop in:  Athletes who often make skin-to-skin contact with other athletes, such as wrestlers.  People who share equipment and mats.  People with a weakened immune system.  What are the signs or symptoms? Symptoms of this condition include:  Itchy, raised red spots and  bumps.  Red scaly patches.  A ring-shaped rash. The rash may have: ? A clear center. ? Scales or red bumps at its center. ? Redness near its borders. ? Dry and scaly skin on or around it.  How is this diagnosed? This condition can usually be diagnosed with a skin exam. A skin scraping may be taken from the affected area and examined under a microscope to see if the fungus is present. How is this treated? This condition may be treated with:  An  antifungal cream or ointment.  An antifungal shampoo.  Antifungal medicines. These may be prescribed if your ringworm is severe, keeps coming back, or lasts a long time.  Follow these instructions at home:  Take over-the-counter and prescription medicines only as told by your health care provider.  If you were given an antifungal cream or ointment: ? Use it as told by your health care provider. ? Wash the infected area and dry it completely before applying the cream or ointment.  If you were given an antifungal shampoo: ? Use it as told by your health care provider. ? Leave the shampoo on your body for 3-5 minutes before rinsing.  While you have a rash: ? Wear loose clothing to stop clothes from rubbing and irritating it. ? Wash or change your bed sheets every night.  If your pet has the same infection, take your pet to see a Animal nutritionist. How is this prevented?  Practice good hygiene.  Wear sandals or shoes in public places and showers.  Do not share personal items with others.  Avoid touching red patches of skin on other people.  Avoid touching pets that have bald spots.  If you touch an animal that has a bald spot, wash your hands. Contact a health care provider if:  Your rash continues to spread after 7 days of treatment.  Your rash is not gone in 4 weeks.  The area around your rash gets red, warm, tender, and swollen. This information is not intended to replace advice given to you by your health care provider. Make sure you discuss any questions you have with your health care provider. Document Released: 11/22/2000 Document Revised: 05/02/2016 Document Reviewed: 09/21/2015 Elsevier Interactive Patient Education  [employer].

## 2018-02-10 NOTE — Assessment & Plan Note (Signed)
Spot glucose high.  Hemoglobin A1c still outside normal range but better than last.  Patient taking Januvia.  Normal renal function.  Last set of labs.  Will start Metformin 500 mg twice a day.  Her skin fungal infection most likely related to her diabetes.  Will reevaluate in 3 months.

## 2018-02-11 LAB — URINE CULTURE

## 2018-02-11 LAB — TSH: TSH: 1.22 u[IU]/mL (ref 0.450–4.500)

## 2018-02-22 ENCOUNTER — Emergency Department (HOSPITAL_COMMUNITY): Payer: Medicare HMO

## 2018-02-22 ENCOUNTER — Encounter (HOSPITAL_COMMUNITY): Payer: Self-pay | Admitting: Emergency Medicine

## 2018-02-22 ENCOUNTER — Emergency Department (HOSPITAL_COMMUNITY)
Admission: EM | Admit: 2018-02-22 | Discharge: 2018-02-23 | Disposition: A | Discharge status: Home / Self Care | Payer: Medicare HMO | Attending: Emergency Medicine | Admitting: Emergency Medicine

## 2018-02-22 ENCOUNTER — Other Ambulatory Visit: Payer: Self-pay

## 2018-02-22 DIAGNOSIS — R1010 Upper abdominal pain, unspecified: Secondary | ICD-10-CM | POA: Diagnosis not present

## 2018-02-22 DIAGNOSIS — F29 Unspecified psychosis not due to a substance or known physiological condition: Secondary | ICD-10-CM | POA: Diagnosis not present

## 2018-02-22 DIAGNOSIS — R0789 Other chest pain: Secondary | ICD-10-CM | POA: Diagnosis not present

## 2018-02-22 DIAGNOSIS — Z5321 Procedure and treatment not carried out due to patient leaving prior to being seen by health care provider: Secondary | ICD-10-CM | POA: Diagnosis not present

## 2018-02-22 DIAGNOSIS — R079 Chest pain, unspecified: Secondary | ICD-10-CM | POA: Diagnosis not present

## 2018-02-22 DIAGNOSIS — R443 Hallucinations, unspecified: Secondary | ICD-10-CM | POA: Diagnosis not present

## 2018-02-22 LAB — CBC
HCT: 33.4 % — ABNORMAL LOW (ref 36.0–46.0)
HEMOGLOBIN: 10.6 g/dL — AB (ref 12.0–15.0)
MCH: 30.5 pg (ref 26.0–34.0)
MCHC: 31.7 g/dL (ref 30.0–36.0)
MCV: 96 fL (ref 78.0–100.0)
Platelets: 298 10*3/uL (ref 150–400)
RBC: 3.48 MIL/uL — ABNORMAL LOW (ref 3.87–5.11)
RDW: 14 % (ref 11.5–15.5)
WBC: 7.8 10*3/uL (ref 4.0–10.5)

## 2018-02-22 LAB — I-STAT BETA HCG BLOOD, ED (MC, WL, AP ONLY): I-stat hCG, quantitative: <5 m[IU]/mL (ref <5)

## 2018-02-22 LAB — I-STAT TROPONIN, ED: Troponin i, poc: 0.01 ng/mL (ref 0.00–0.08)

## 2018-02-22 LAB — BASIC METABOLIC PANEL
ANION GAP: 7 (ref 5–15)
BUN: 16 mg/dL (ref 6–20)
CHLORIDE: 105 mmol/L (ref 101–111)
CO2: 27 mmol/L (ref 22–32)
Calcium: 8.1 mg/dL — ABNORMAL LOW (ref 8.9–10.3)
Creatinine, Ser: 1.02 mg/dL — ABNORMAL HIGH (ref 0.44–1.00)
GFR calc Af Amer: >60 mL/min (ref >60)
Glucose, Bld: 228 mg/dL — ABNORMAL HIGH (ref 65–99)
POTASSIUM: 3.4 mmol/L — AB (ref 3.5–5.1)
Sodium: 139 mmol/L (ref 135–145)

## 2018-02-22 LAB — CBG MONITORING, ED: Glucose-Capillary: 198 mg/dL — ABNORMAL HIGH (ref 65–99)

## 2018-02-22 NOTE — ED Triage Notes (Addendum)
Patient is here because she started having vibrations in her body. Patient is complaining of tightness in her chest and tightness in upper abdominal pain. When ems arrived on scene patient bs-52 and 15 grams of oral glucose was given and it went to 234.

## 2018-02-23 NOTE — ED Notes (Signed)
Patient was rude and stated people went in front of her. She stated she came by ambulance and she should not be waiting. Patient stated that she did not want to wait.

## 2018-02-24 ENCOUNTER — Ambulatory Visit (HOSPITAL_COMMUNITY)
Admission: EM | Admit: 2018-02-24 | Discharge: 2018-02-24 | Disposition: A | Discharge status: Home / Self Care | Payer: Medicare HMO | Attending: Family Medicine | Admitting: Family Medicine

## 2018-02-24 ENCOUNTER — Encounter (HOSPITAL_COMMUNITY): Payer: Self-pay | Admitting: Emergency Medicine

## 2018-02-24 ENCOUNTER — Ambulatory Visit: Payer: Medicare HMO | Admitting: Emergency Medicine

## 2018-02-24 ENCOUNTER — Telehealth: Payer: Self-pay | Admitting: Cardiology

## 2018-02-24 ENCOUNTER — Other Ambulatory Visit: Payer: Self-pay

## 2018-02-24 DIAGNOSIS — R202 Paresthesia of skin: Secondary | ICD-10-CM | POA: Diagnosis not present

## 2018-02-24 NOTE — Telephone Encounter (Signed)
New message    Patient calling to request information on getting loop recorder removed. Please call

## 2018-02-24 NOTE — Telephone Encounter (Signed)
Informed pt that she has an appt w/ MD on 02-25-18 to discuss the removal of the loop recorder. Pt verbalized understanding.

## 2018-02-24 NOTE — ED Provider Notes (Signed)
[place]   [id number] 02/24/18 Arrival Time: 1110   SUBJECTIVE:  Brandi Gomez is a 47 y.o. female who presents to the urgent care with complaint of bilateral breast pain and "vibrations", onset Sunday  Patient had a cardiac transmitter implanted last summer after a CVA to see if the CVA was caused by an arrhythmia.    Patient's friend told her that the implant could cause mind control. Patient is experiencing some "vibrations" which she thinks may come from the transmitter.  She quotes the Bible, citing "devices are the work of the devil".  Past Medical History:  Diagnosis Date  . Anemia   . Anxiety   . Bipolar 1 disorder ([place])   . Common migraine 05/19/2015  . Depression   . Diabetes mellitus, type II ([place])   . Hypertension   . Irritable bowel syndrome (IBS)   . Mild mental retardation   . Obesity   . Partial complex seizure disorder with intractable epilepsy ([place]) 05/12/2014  . Seizures ([place])    intractable, sz 08/23/17  . Sleep apnea   . Stroke ([place])   . Type II or unspecified type diabetes mellitus without mention of complication, not stated as uncontrolled    Family History  Problem Relation Age of Onset  . Diabetes Mother        passed away from accidental death  . Hypertension Mother   . Bipolar disorder Father   . Diabetes Daughter   . Leukemia Daughter   . Ovarian cancer Maternal Aunt    Social History   Socioeconomic History  . Marital status: Single    Spouse name: [name]  . Number of children: 3  . Years of education: 45  . Highest education level: Not on file  Social Needs  . Financial resource strain: Not on file  . Food insecurity - worry: Not on file  . Food insecurity - inability: Not on file  . Transportation needs - medical: Not on file  . Transportation needs - non-medical: Not on file  Occupational History  . Occupation: disabled    Employer: DISABLED  Tobacco Use  . Smoking status: Never Smoker  . Smokeless  tobacco: Never Used  Substance and Sexual Activity  . Alcohol use: No  . Drug use: No  . Sexual activity: No  Other Topics Concern  . Not on file  Social History Narrative   Patient lives at home with daughter.    Patient has 3 children.    Patient is right handed.    Patient has a high school education.    Patient is on disability   Patient drinks 2 cups of caffeine daily.   No outpatient medications have been marked as taking for the 02/24/18 encounter [facility] Encounter).   Allergies  Allergen Reactions  . Amoxicillin Itching    Has patient had a PCN reaction causing immediate rash, facial/tongue/throat swelling, SOB or lightheadedness with hypotension: yes Has patient had a PCN reaction causing severe rash involving mucus membranes or skin necrosis: no Has patient had a PCN reaction that required hospitalization: no Has patient had a PCN reaction occurring within the last 10 years: yes If all of the above answers are "NO", then may proceed with Cephalosporin use.  Marland Kitchen Lisinopril Swelling    Angioedema   . Hydrocodone Other (See Comments)    Depressed       ROS: As per HPI, remainder of ROS negative.   OBJECTIVE:   Vitals:   02/24/18 1156  BP: (!) 138/104  Pulse: 71  Resp: (!) 22  Temp: 98.6 F (37 C)  TempSrc: Oral  SpO2: 97%     General appearance: alert; no distress Eyes: PERRL; EOMI; conjunctiva normal HENT: normocephalic; atraumatic; ; oral mucosa normal Neck: supple Lungs: clear to auscultation bilaterally Heart: regular rate and rhythm Abdomen: soft, non-tender; bowel sounds normal; no masses or organomegaly; no guarding or rebound tenderness Back: no CVA tenderness Extremities: no cyanosis or edema; symmetrical with no gross deformities Skin: warm and dry Neurologic: normal gait; grossly normal Psychological: alert and cooperative; normal mood and affect;    I spent 30 minutes trying to convince her that the transmitter could not control her  mind.  I think I was successful, but I'm not sure how long she will believe what I told her.      Labs:  Results for orders placed or performed in visit on 02/10/18  Urine Culture  Result Value Ref Range   Urine Culture, Routine Final report    Organism ID, Bacteria Comment   TSH  Result Value Ref Range   TSH 1.220 0.450 - 4.500 uIU/mL  POCT urinalysis dipstick  Result Value Ref Range   Color, UA yellow yellow   Clarity, UA clear clear   Glucose, UA negative negative mg/dL   Bilirubin, UA negative negative   Ketones, POC UA trace (5) (A) negative mg/dL   Spec Grav, UA 1.020 1.010 - 1.025   Blood, UA negative negative   pH, UA 6.0 5.0 - 8.0   Protein Ur, POC trace (A) negative mg/dL   Urobilinogen, UA 0.2 0.2 or 1.0 E.U./dL   Nitrite, UA Negative Negative   Leukocytes, UA Trace (A) Negative  POCT glycosylated hemoglobin (Hb A1C)  Result Value Ref Range   Hemoglobin A1C 6.6   POCT glucose (manual entry)  Result Value Ref Range   POC Glucose 188 (A) 70 - 99 mg/dl    Labs Reviewed - No data to display  No results found.     ASSESSMENT & PLAN:  1. Paresthesia    Reassurance provided.  Since she has had no arrhythmias in 8 months, I suggest that she discuss removal of transmitter with the cardiologist. No orders of the defined types were placed in this encounter.   Reviewed expectations re: course of current medical issues. Questions answered. Outlined signs and symptoms indicating need for more acute intervention. Patient verbalized understanding. After Visit Summary given.    Procedures:      [name], MD 02/24/18 1228

## 2018-02-24 NOTE — Telephone Encounter (Signed)
LVM for pt to call back.

## 2018-02-24 NOTE — Discharge Instructions (Signed)
Ask Dr. Curt Bears about removing the transmitter.

## 2018-02-24 NOTE — ED Triage Notes (Signed)
Complains of bilateral breast pain and "vibrations", onset sunday

## 2018-02-25 ENCOUNTER — Encounter: Payer: Self-pay | Admitting: Cardiology

## 2018-02-25 ENCOUNTER — Other Ambulatory Visit: Payer: Self-pay | Admitting: Cardiology

## 2018-02-25 ENCOUNTER — Ambulatory Visit (INDEPENDENT_AMBULATORY_CARE_PROVIDER_SITE_OTHER): Payer: Medicare HMO | Admitting: Cardiology

## 2018-02-25 VITALS — BP 120/78 | HR 76 | Ht 61.0 in | Wt 257.2 lb

## 2018-02-25 DIAGNOSIS — I639 Cerebral infarction, unspecified: Secondary | ICD-10-CM | POA: Diagnosis not present

## 2018-02-25 DIAGNOSIS — I10 Essential (primary) hypertension: Secondary | ICD-10-CM

## 2018-02-25 NOTE — Patient Instructions (Addendum)
Medication Instructions:  Your physician recommends that you continue on your current medications as directed. Please refer to the Current Medication list given to you today.  Labwork: None ordered  Testing/Procedures: Your physician will be removing your LINQ monitor (loop recorder).  Please arrive to [facility], main entrance "A", @ 7:30 a.m on 03/04/18.  You may eat a light breakfast that morning.  You may take your medications that morning.   Follow-Up: Your physician recommends that you schedule a follow-up appointment in: 7-10 days, after your procedure on 02/04/18, with device clinic for a wound check.  No follow up is needed at this time with Dr. Curt Bears.  He will see you on an as needed basis.   * If you need a refill on your cardiac medications before your next appointment, please call your pharmacy.   *Please note that any paperwork needing to be filled out by the provider will need to be addressed at the front desk prior to seeing the provider. Please note that any FMLA, disability or other documents regarding health condition is subject to a $25.00 charge that must be received prior to completion of paperwork in the form of a money order or check.  Thank you for choosing [facility]!!   Trinidad Curet, RN [phone]

## 2018-02-25 NOTE — Progress Notes (Signed)
Electrophysiology Office Note   Date:  02/25/2018   ID:  Tagan, Bartram 1971/06/21, MRN [id number]  PCP:  Horald Pollen, MD  Cardiologist:   Primary Electrophysiologist:  Chardae Mulkern Meredith Leeds, MD    Chief Complaint  Patient presents with  . Pacemaker Check    Discuss Loop removal/C stroke     History of Present Illness: Brandi Gomez is a 47 y.o. female who is being seen today for the evaluation of CVA at the request of Horald Pollen, *. Presenting today for electrophysiology evaluation.  She has a history of anxiety, bipolar 1, hypertension, epilepsy, sleep apnea, type 2 diabetes, and CVA.  She had a Linq monitor implanted after her CVA on 05/30/17.  Today, she denies symptoms of palpitations, chest pain, shortness of breath, orthopnea, PND, lower extremity edema, claudication, dizziness, presyncope, syncope, bleeding, or neurologic sequela. The patient is tolerating medications without difficulties.  His point, she wishes to have her link monitor removed.  She has no complaints of chest pain, shortness of breath, or fatigue.  She gives no reason for wishing it to be removed.   Past Medical History:  Diagnosis Date  . Anemia   . Anxiety   . Bipolar 1 disorder ([place])   . Common migraine 05/19/2015  . Depression   . Diabetes mellitus, type II ([place])   . Hypertension   . Irritable bowel syndrome (IBS)   . Mild mental retardation   . Obesity   . Partial complex seizure disorder with intractable epilepsy ([place]) 05/12/2014  . Seizures ([place])    intractable, sz 08/23/17  . Sleep apnea   . Stroke ([place])   . Type II or unspecified type diabetes mellitus without mention of complication, not stated as uncontrolled    Past Surgical History:  Procedure Laterality Date  . COLONOSCOPY     2012-normal , Dr Sharlett Iles  . ESOPHAGOGASTRODUODENOSCOPY     normal-Dr Patterson 2012  . LOOP RECORDER INSERTION N/A 05/30/2017   Procedure: Loop Recorder Insertion;  Surgeon:  Constance Haw, MD;  Location: [place] [facility];  Service: Cardiovascular;  Laterality: N/A;  . MYRINGOTOMY WITH TUBE PLACEMENT    . MYRINGOTOMY WITH TUBE PLACEMENT Right 11/05/2017   Procedure: MYRINGOTOMY WITH TUBE PLACEMENT;  Surgeon: Melissa Montane, MD;  Location: [place];  Service: ENT;  Laterality: Right;  right T Tube placement  . NASAL SINUS SURGERY    . TEE WITHOUT CARDIOVERSION N/A 05/30/2017   Procedure: TRANSESOPHAGEAL ECHOCARDIOGRAM (TEE);  Surgeon: Acie Fredrickson Wonda Cheng, MD;  Location: [facility] END[facility]COPY;  Service: Cardiovascular;  Laterality: N/A;     Current Outpatient Medications  Medication Sig Dispense Refill  . ACCU-CHEK SOFTCLIX LANCETS lancets Use as instructed 100 each 12  . aspirin 325 MG tablet Take 1 tablet (325 mg total) by mouth daily. 30 tablet 1  . atorvastatin (LIPITOR) 10 MG tablet Take 1 tablet (10 mg total) by mouth daily at 6 PM. 90 tablet 3  . carvedilol (COREG) 3.125 MG tablet Take 1 tablet (3.125 mg total) by mouth 2 (two) times daily. 60 tablet 3  . dicyclomine (BENTYL) 10 MG capsule Take 1 capsule (10 mg total) by mouth 3 (three) times daily before meals. 90 capsule 1  . docusate sodium (COLACE) 100 MG capsule Take 1 capsule (100 mg total) by mouth every 12 (twelve) hours. 60 capsule 0  . glucose blood test strip Use as instructed 100 each 12  . ibuprofen (ADVIL,MOTRIN) 200 MG tablet Take 400 mg  by mouth every 6 (six) hours as needed for headache or mild pain.    Marland Kitchen levETIRAcetam (KEPPRA) 500 MG tablet Take 1 tablet (500 mg total) by mouth 2 (two) times daily. 180 tablet 1  . magnesium citrate SOLN Take 1 Bottle by mouth once.    . metFORMIN (GLUCOPHAGE) 500 MG tablet Take 1 tablet (500 mg total) by mouth 2 (two) times daily with a meal. 180 tablet 3  . nitrofurantoin, macrocrystal-monohydrate, (MACROBID) 100 MG capsule Take 1 capsule (100 mg total) by mouth 2 (two) times daily. 10 capsule 0  . phenytoin (DILANTIN) 100 MG ER capsule TAKE 2 CAPSULES IN THE MORNING  AND 3 CAPSULES IN THE EVENING 450 capsule 1  . sitaGLIPtin (JANUVIA) 50 MG tablet Take 1 tablet (50 mg total) by mouth daily. For control of blood sugar 90 tablet 1  . torsemide (DEMADEX) 20 MG tablet Take 1 tablet (20 mg total) by mouth daily. 90 tablet 1  . DULoxetine (CYMBALTA) 30 MG capsule Take 1 capsule (30 mg total) by mouth daily. 30 capsule 3   No current facility-administered medications for this visit.     Allergies:   Amoxicillin; Lisinopril; and Hydrocodone   Social History:  The patient  reports that  has never smoked. she has never used smokeless tobacco. She reports that she does not drink alcohol or use drugs.   Family History:  The patient's family history includes Bipolar disorder in her father; Diabetes in her daughter and mother; Hypertension in her mother; Leukemia in her daughter; Ovarian cancer in her maternal aunt.    ROS:  Please see the history of present illness.   Otherwise, review of systems is positive for appetite change, weight change, headaches.   All other systems are reviewed and negative.    PHYSICAL EXAM: VS:  BP 120/78   Pulse 76   Ht 5\' 1"  (1.549 m)   Wt 257 lb 3.2 oz (116.7 kg)   BMI 48.60 kg/m  , BMI Body mass index is 48.6 kg/m. GEN: Well nourished, well developed, in no acute distress  HEENT: normal  Neck: no JVD, carotid bruits, or masses Cardiac: RRR; no murmurs, rubs, or gallops,no edema  Respiratory:  clear to auscultation bilaterally, normal work of breathing GI: soft, nontender, nondistended, + BS MS: no deformity or atrophy  Skin: warm and dry, device pocket is well healed Neuro:  Strength and sensation are intact Psych: euthymic mood, full affect  EKG:  EKG is not ordered today. Personal review of the ekg ordered 02/22/18 shows sinus rhythm, prolonged QTC  Recent Labs: 09/17/2017: Magnesium 1.7 10/08/2017: ALT 24 02/10/2018: TSH 1.220 02/22/2018: BUN 16; Creatinine, Ser 1.02; Hemoglobin 10.6; Platelets 298; Potassium 3.4;  Sodium 139    Lipid Panel     Component Value Date/Time   CHOL 158 05/21/2017 0506   TRIG 161 (H) 05/21/2017 0506   HDL 44 05/21/2017 0506   CHOLHDL 3.6 05/21/2017 0506   VLDL 32 05/21/2017 0506   LDLCALC 82 05/21/2017 0506     Wt Readings from Last 3 Encounters:  02/25/18 257 lb 3.2 oz (116.7 kg)  02/10/18 256 lb 3.2 oz (116.2 kg)  01/12/18 251 lb (113.9 kg)     Other studies Reviewed: Additional studies/ records that were reviewed today include: TTE 05/22/17 Review of the above records today demonstrates:  - Left ventricle: Inferior and septal hypokinesis Poor image   quality no definity used. The cavity size was mildly dilated.   Wall thickness was increased  in a pattern of mild LVH. Systolic   function was mildly to moderately reduced. The estimated ejection   fraction was in the range of 40% to 45%. Left ventricular   diastolic function parameters were normal. - Left atrium: The atrium was mildly dilated. - Atrial septum: No defect or patent foramen ovale was identified.   ASSESSMENT AND PLAN:  1.  Cryptogenic stroke: Status post link monitor.  She does not wish to have the device monitor any further.  She is not been doing remote monitoring for the last few months.  She would prefer to have the monitor removed.    2.  Hypertension: Blood pressure well controlled today.  No changes.  Current medicines are reviewed at length with the patient today.   The patient does not have concerns regarding her medicines.  The following changes were made today:  none  Labs/ tests ordered today include:  No orders of the defined types were placed in this encounter.    Disposition:   FU with Paisley Grajeda PRN  Signed, Tana Trefry Meredith Leeds, MD  02/25/2018 10:08 AM     [facility] [address] [place] [address] [phone] (office) [phone] (fax)

## 2018-02-27 NOTE — Telephone Encounter (Signed)
New Message    Patients wants the device turned off , she wants it turned off , it keeps vibrating, she thinks someone is targeting her device through the transmission.

## 2018-02-27 NOTE — Telephone Encounter (Signed)
Endorsed to the pt that per [place] with Device, they reached out to [name] about this issue, and asked if she could assist with moving up the pts extraction date, to remove her ILR.  Informed the pt that she will hear back from either [name] or Dr. Curt Bears RN, with the status of her extraction date.  Pt verbalized understanding and agrees with this plan.

## 2018-02-27 NOTE — Telephone Encounter (Addendum)
Message below came from our [place].    Jimmey Ralph, RN  Sent: Fri February 27, 2018 12:18 PM  To: Nuala Alpha, LPN      Message   Pamala Hurry already talked to [name] about this. My understanding was that she wanted to move up her extraction since we can't turn "off" the ILR.

## 2018-03-03 NOTE — Telephone Encounter (Signed)
Follow Up: ° ° ° °Returning your call from yesterday. °

## 2018-03-04 ENCOUNTER — Ambulatory Visit (HOSPITAL_COMMUNITY)
Admission: RE | Admit: 2018-03-04 | Discharge: 2018-03-04 | Disposition: A | Discharge status: Home / Self Care | Payer: Medicare HMO | Source: Ambulatory Visit | Attending: Cardiology | Admitting: Cardiology

## 2018-03-04 ENCOUNTER — Encounter (HOSPITAL_COMMUNITY)
Admission: RE | Disposition: A | Discharge status: Home / Self Care | Payer: Self-pay | Source: Ambulatory Visit | Attending: Cardiology

## 2018-03-04 ENCOUNTER — Encounter (HOSPITAL_COMMUNITY): Payer: Self-pay | Admitting: Cardiology

## 2018-03-04 DIAGNOSIS — G43009 Migraine without aura, not intractable, without status migrainosus: Secondary | ICD-10-CM | POA: Insufficient documentation

## 2018-03-04 DIAGNOSIS — Z8249 Family history of ischemic heart disease and other diseases of the circulatory system: Secondary | ICD-10-CM | POA: Insufficient documentation

## 2018-03-04 DIAGNOSIS — Z8041 Family history of malignant neoplasm of ovary: Secondary | ICD-10-CM | POA: Diagnosis not present

## 2018-03-04 DIAGNOSIS — F79 Unspecified intellectual disabilities: Secondary | ICD-10-CM | POA: Insufficient documentation

## 2018-03-04 DIAGNOSIS — Z888 Allergy status to other drugs, medicaments and biological substances status: Secondary | ICD-10-CM | POA: Diagnosis not present

## 2018-03-04 DIAGNOSIS — Z885 Allergy status to narcotic agent status: Secondary | ICD-10-CM | POA: Diagnosis not present

## 2018-03-04 DIAGNOSIS — K589 Irritable bowel syndrome without diarrhea: Secondary | ICD-10-CM | POA: Diagnosis not present

## 2018-03-04 DIAGNOSIS — F419 Anxiety disorder, unspecified: Secondary | ICD-10-CM | POA: Diagnosis not present

## 2018-03-04 DIAGNOSIS — Z818 Family history of other mental and behavioral disorders: Secondary | ICD-10-CM | POA: Diagnosis not present

## 2018-03-04 DIAGNOSIS — Z6841 Body Mass Index (BMI) 40.0 and over, adult: Secondary | ICD-10-CM | POA: Insufficient documentation

## 2018-03-04 DIAGNOSIS — F319 Bipolar disorder, unspecified: Secondary | ICD-10-CM | POA: Diagnosis not present

## 2018-03-04 DIAGNOSIS — I639 Cerebral infarction, unspecified: Secondary | ICD-10-CM | POA: Insufficient documentation

## 2018-03-04 DIAGNOSIS — Z880 Allergy status to penicillin: Secondary | ICD-10-CM | POA: Diagnosis not present

## 2018-03-04 DIAGNOSIS — E669 Obesity, unspecified: Secondary | ICD-10-CM | POA: Insufficient documentation

## 2018-03-04 DIAGNOSIS — I10 Essential (primary) hypertension: Secondary | ICD-10-CM | POA: Diagnosis not present

## 2018-03-04 DIAGNOSIS — G4730 Sleep apnea, unspecified: Secondary | ICD-10-CM | POA: Insufficient documentation

## 2018-03-04 DIAGNOSIS — Z806 Family history of leukemia: Secondary | ICD-10-CM | POA: Insufficient documentation

## 2018-03-04 DIAGNOSIS — E119 Type 2 diabetes mellitus without complications: Secondary | ICD-10-CM | POA: Diagnosis not present

## 2018-03-04 DIAGNOSIS — Z833 Family history of diabetes mellitus: Secondary | ICD-10-CM | POA: Insufficient documentation

## 2018-03-04 DIAGNOSIS — I6389 Other cerebral infarction: Secondary | ICD-10-CM | POA: Diagnosis not present

## 2018-03-04 HISTORY — PX: LOOP RECORDER REMOVAL: EP1215

## 2018-03-04 LAB — GLUCOSE, CAPILLARY: Glucose-Capillary: 153 mg/dL — ABNORMAL HIGH (ref 65–99)

## 2018-03-04 SURGERY — LOOP RECORDER REMOVAL

## 2018-03-04 MED ORDER — LIDOCAINE-EPINEPHRINE 1 %-1:100000 IJ SOLN
INTRAMUSCULAR | Status: AC
  Filled 2018-03-04: qty 1

## 2018-03-04 MED ORDER — LIDOCAINE-EPINEPHRINE 1 %-1:100000 IJ SOLN
INTRAMUSCULAR | Status: DC | PRN
  Administered 2018-03-04: 30 mL

## 2018-03-04 SURGICAL SUPPLY — 1 items: PACK LOOP INSERTION (CUSTOM PROCEDURE TRAY) ×3 IMPLANT

## 2018-03-04 NOTE — H&P (Signed)
Brandi Gomez has presented today for surgery, with the diagnosis of cryptogenic stroke.  The various methods of treatment have been discussed with the patient and family. After consideration of risks, benefits and other options for treatment, the patient has consented to  Procedure(s): LINQ explant as a surgical intervention .  Risks include but not limited to bleeding, infection, among others. The patient's history has been reviewed, patient examined, no change in status, stable for surgery.  I have reviewed the patient's chart and labs.  Questions were answered to the patient's satisfaction.    Brandi Gomez Brandi Bears, MD 03/04/2018 7:23 AM

## 2018-03-05 ENCOUNTER — Other Ambulatory Visit: Payer: Self-pay | Admitting: Emergency Medicine

## 2018-03-11 ENCOUNTER — Ambulatory Visit: Payer: Self-pay

## 2018-03-14 ENCOUNTER — Other Ambulatory Visit: Payer: Self-pay

## 2018-03-14 ENCOUNTER — Encounter (HOSPITAL_COMMUNITY): Payer: Self-pay | Admitting: *Deleted

## 2018-03-14 ENCOUNTER — Ambulatory Visit (HOSPITAL_COMMUNITY)
Admission: EM | Admit: 2018-03-14 | Discharge: 2018-03-14 | Disposition: A | Discharge status: Home / Self Care | Payer: Medicare HMO | Attending: Internal Medicine | Admitting: Internal Medicine

## 2018-03-14 DIAGNOSIS — B359 Dermatophytosis, unspecified: Secondary | ICD-10-CM

## 2018-03-14 MED ORDER — NYSTATIN 100000 UNIT/GM EX POWD: Freq: Two times a day (BID) | CUTANEOUS | 0 refills | Status: DC

## 2018-03-14 MED ORDER — CLOTRIMAZOLE-BETAMETHASONE 1-0.05 % EX CREA: TOPICAL_CREAM | CUTANEOUS | 1 refills | Status: DC

## 2018-03-14 NOTE — ED Triage Notes (Signed)
Rash in center of chest, need refills for Clotrimazole cream

## 2018-03-14 NOTE — ED Provider Notes (Signed)
[place]    CSN: [id number] Arrival date & time: 03/14/18  1158     History   Chief Complaint Chief Complaint  Patient presents with  . Rash  . Medication Refill    HPI Brandi Gomez is a 47 y.o. female.   47 year old female, with history of hypertension, anxiety, depression, bipolar disorder, epilepsy, irritable bowel, diabetes, presenting today due to rash as well as medication refill.  Patient states that she has a rash underneath her bilateral breasts and in her left axilla that has been treated with antifungal cream.  States that the rash has recurred as she has recently run out of her medicine.  The history is provided by the patient and a relative.  Rash  Location:  Torso Torso rash location:  L breast, R breast and L axilla Quality: burning and redness   Severity:  Moderate Onset quality:  Gradual Duration:  2 months Timing:  Constant Progression:  Waxing and waning Chronicity:  Recurrent Context: not animal contact, not chemical exposure, not diapers, not eggs, not exposure to similar rash and not food   Relieved by:  Anti-fungal cream Worsened by:  Nothing Ineffective treatments:  None tried Associated symptoms: no abdominal pain, no diarrhea, no fatigue, no fever, no headaches, no induration, no joint pain, no myalgias, no nausea, no periorbital edema, no shortness of breath, no sore throat and not vomiting     Past Medical History:  Diagnosis Date  . Anemia   . Anxiety   . Bipolar 1 disorder ([place])   . Common migraine 05/19/2015  . Depression   . Diabetes mellitus, type II ([place])   . Hypertension   . Irritable bowel syndrome (IBS)   . Mild mental retardation   . Obesity   . Partial complex seizure disorder with intractable epilepsy ([place]) 05/12/2014  . Seizures ([place])    intractable, sz 08/23/17  . Sleep apnea   . Stroke ([place])   . Type II or unspecified type diabetes mellitus without mention of complication, not stated as uncontrolled      Patient Active Problem List   Diagnosis Date Noted  . Vaginal odor 02/10/2018  . Fungal skin infection 02/10/2018  . Malodorous urine 10/08/2017  . Lower urinary tract symptoms (LUTS) 10/08/2017  . Acute UTI 10/08/2017  . Constipation 10/08/2017  . History of recent stroke 08/06/2017  . Angioedema 06/03/2017  . OSA (obstructive sleep apnea)   . Cryptogenic stroke ([place]) 05/20/2017  . Controlled type 2 diabetes mellitus without complication, without long-term current use of insulin ([place]) 05/08/2017  . Tiredness 05/13/2014  . Essential hypertension 07/27/2008  . Convulsions ([place]) 02/05/2007    Past Surgical History:  Procedure Laterality Date  . COLONOSCOPY     2012-normal , Dr Sharlett Iles  . ESOPHAGOGASTRODUODENOSCOPY     normal-Dr Patterson 2012  . LOOP RECORDER INSERTION N/A 05/30/2017   Procedure: Loop Recorder Insertion;  Surgeon: Constance Haw, MD;  Location: [facility];  Service: Cardiovascular;  Laterality: N/A;  . LOOP RECORDER REMOVAL N/A 03/04/2018   Procedure: LOOP RECORDER REMOVAL;  Surgeon: Constance Haw, MD;  Location: [facility];  Service: Cardiovascular;  Laterality: N/A;  . MYRINGOTOMY WITH TUBE PLACEMENT    . MYRINGOTOMY WITH TUBE PLACEMENT Right 11/05/2017   Procedure: MYRINGOTOMY WITH TUBE PLACEMENT;  Surgeon: Melissa Montane, MD;  Location: [facility];  Service: ENT;  Laterality: Right;  right T Tube placement  . NASAL SINUS SURGERY    . TEE  WITHOUT CARDIOVERSION N/A 05/30/2017   Procedure: TRANSESOPHAGEAL ECHOCARDIOGRAM (TEE);  Surgeon: Acie Fredrickson Wonda Cheng, MD;  Location: [facility];  Service: Cardiovascular;  Laterality: N/A;    OB History   None      Home Medications    Prior to Admission medications   Medication Sig Start Date End Date Taking? Authorizing Provider  ACCU-CHEK SOFTCLIX LANCETS lancets Use as instructed 06/06/17   Horald Pollen, MD  aspirin 325 MG tablet Take 1 tablet (325 mg total) by mouth daily. 05/23/17    Barton Dubois, MD  atorvastatin (LIPITOR) 10 MG tablet Take 1 tablet (10 mg total) by mouth daily at 6 PM. Patient taking differently: Take 10 mg by mouth daily.  12/23/17 03/23/18  Horald Pollen, MD  carvedilol (COREG) 3.125 MG tablet TAKE 1 TABLET BY MOUTH TWICE A DAY 03/05/18   Horald Pollen, MD  clotrimazole-betamethasone (LOTRISONE) cream Apply to affected area 2 times daily prn 03/14/18   Blue, Olivia C, PA-C  dicyclomine (BENTYL) 10 MG capsule Take 1 capsule (10 mg total) by mouth 3 (three) times daily before meals. 11/04/17   Doran Stabler, MD  docusate sodium (COLACE) 100 MG capsule Take 1 capsule (100 mg total) by mouth every 12 (twelve) hours. 12/02/17   Khatri, Hina, PA-C  DULoxetine (CYMBALTA) 30 MG capsule Take 1 capsule (30 mg total) by mouth daily. 08/06/17 03/04/18  Horald Pollen, MD  glucose blood test strip Use as instructed 06/06/17   Horald Pollen, MD  ibuprofen (ADVIL,MOTRIN) 200 MG tablet Take 400 mg by mouth every 6 (six) hours as needed for headache or mild pain.    [provider]  levETIRAcetam (KEPPRA) 500 MG tablet Take 1 tablet (500 mg total) by mouth 2 (two) times daily. 11/03/17   Ward Givens, NP  magnesium citrate SOLN Take 1 Bottle by mouth once.    [provider]  metFORMIN (GLUCOPHAGE) 500 MG tablet Take 1 tablet (500 mg total) by mouth 2 (two) times daily with a meal. 02/10/18   Sagardia, Ines Bloomer, MD  nystatin (MYCOSTATIN/NYSTOP) powder Apply topically 2 (two) times daily. 03/14/18   Blue, Olivia C, PA-C  phenytoin (DILANTIN) 100 MG ER capsule TAKE 2 CAPSULES IN THE MORNING AND 3 CAPSULES IN THE EVENING 11/28/17   [name], MD  sitaGLIPtin (JANUVIA) 50 MG tablet Take 1 tablet (50 mg total) by mouth daily. For control of blood sugar 05/08/17   Sagardia, Ines Bloomer, MD  torsemide (DEMADEX) 20 MG tablet Take 1 tablet (20 mg total) by mouth daily. 05/08/17   Horald Pollen, MD    Family  History Family History  Problem Relation Age of Onset  . Diabetes Mother        passed away from accidental death  . Hypertension Mother   . Bipolar disorder Father   . Diabetes Daughter   . Leukemia Daughter   . Ovarian cancer Maternal Aunt     Social History Social History   Tobacco Use  . Smoking status: Never Smoker  . Smokeless tobacco: Never Used  Substance Use Topics  . Alcohol use: No  . Drug use: No     Allergies   Amoxicillin; Lisinopril; and Hydrocodone   Review of Systems Review of Systems  Constitutional: Negative for chills, fatigue and fever.  HENT: Negative for ear pain and sore throat.   Eyes: Negative for pain and visual disturbance.  Respiratory: Negative for cough and shortness of breath.   Cardiovascular:  Negative for chest pain and palpitations.  Gastrointestinal: Negative for abdominal pain, diarrhea, nausea and vomiting.  Genitourinary: Negative for dysuria and hematuria.  Musculoskeletal: Negative for arthralgias, back pain and myalgias.  Skin: Positive for rash. Negative for color change.  Neurological: Negative for seizures, syncope and headaches.  All other systems reviewed and are negative.    Physical Exam Triage Vital Signs ED Triage Vitals  Enc Vitals Group     BP 03/14/18 1209 139/84     Pulse Rate 03/14/18 1209 70     Resp --      Temp 03/14/18 1209 99.1 F (37.3 C)     Temp Source 03/14/18 1209 Oral     SpO2 03/14/18 1209 100 %     Weight --      Height --      Head Circumference --      Peak Flow --      Pain Score 03/14/18 1208 0     Pain Loc --      Pain Edu? --      Excl. in [place]? --    No data found.  Updated Vital Signs BP 139/84 (BP Location: Left Arm)   Pulse 70   Temp 99.1 F (37.3 C) (Oral)   SpO2 100%   Visual Acuity Right Eye Distance:   Left Eye Distance:   Bilateral Distance:    Right Eye Near:   Left Eye Near:    Bilateral Near:     Physical Exam  Constitutional: She appears  well-developed and well-nourished. No distress.  HENT:  Head: Normocephalic and atraumatic.  Eyes: Conjunctivae are normal.  Neck: Neck supple.  Cardiovascular: Normal rate and regular rhythm.  No murmur heard. Pulmonary/Chest: Effort normal and breath sounds normal. No respiratory distress.  Abdominal: Soft. There is no tenderness.  Musculoskeletal: She exhibits no edema.  Neurological: She is alert.  Skin: Skin is warm and dry. Rash noted.  Erythematous edematous rash to the left axilla and under both breasts consistent with candida  Psychiatric: She has a normal mood and affect.  Nursing note and vitals reviewed.    UC Treatments / Results  Labs (all labs ordered are listed, but only abnormal results are displayed) Labs Reviewed - No data to display  EKG None Radiology No results found.  Procedures Procedures (including critical care time)  Medications Ordered in UC Medications - No data to display   Initial Impression / Assessment and Plan / UC Course  I have reviewed the triage vital signs and the nursing notes.  Pertinent labs & imaging results that were available during my care of the patient were reviewed by me and considered in my medical decision making (see chart for details).     Refill of lotrisone. Added nystatin powder  Final Clinical Impressions(s) / UC Diagnoses   Final diagnoses:  Tinea    ED Discharge Orders        Ordered    clotrimazole-betamethasone (LOTRISONE) cream     03/14/18 1220    nystatin (MYCOSTATIN/NYSTOP) powder  2 times daily     03/14/18 1220       Controlled Substance Prescriptions [place] Controlled Substance Registry consulted? Not Applicable   Phebe Colla, [place] 03/14/18 1225

## 2018-03-16 ENCOUNTER — Other Ambulatory Visit: Payer: Self-pay | Admitting: Emergency Medicine

## 2018-03-17 ENCOUNTER — Ambulatory Visit (HOSPITAL_COMMUNITY): Payer: Medicare HMO | Admitting: Psychiatry

## 2018-03-19 ENCOUNTER — Ambulatory Visit: Payer: Self-pay

## 2018-03-25 ENCOUNTER — Other Ambulatory Visit: Payer: Self-pay

## 2018-03-25 ENCOUNTER — Emergency Department (HOSPITAL_COMMUNITY)
Admission: EM | Admit: 2018-03-25 | Discharge: 2018-03-25 | Discharge status: Against Medical Advice | Payer: Medicare HMO | Attending: Emergency Medicine | Admitting: Emergency Medicine

## 2018-03-25 DIAGNOSIS — Z7982 Long term (current) use of aspirin: Secondary | ICD-10-CM | POA: Insufficient documentation

## 2018-03-25 DIAGNOSIS — I10 Essential (primary) hypertension: Secondary | ICD-10-CM | POA: Diagnosis not present

## 2018-03-25 DIAGNOSIS — F600 Paranoid personality disorder: Secondary | ICD-10-CM | POA: Insufficient documentation

## 2018-03-25 DIAGNOSIS — Z79899 Other long term (current) drug therapy: Secondary | ICD-10-CM | POA: Insufficient documentation

## 2018-03-25 DIAGNOSIS — F22 Delusional disorders: Secondary | ICD-10-CM | POA: Diagnosis not present

## 2018-03-25 DIAGNOSIS — E119 Type 2 diabetes mellitus without complications: Secondary | ICD-10-CM | POA: Diagnosis not present

## 2018-03-25 DIAGNOSIS — Z7984 Long term (current) use of oral hypoglycemic drugs: Secondary | ICD-10-CM | POA: Diagnosis not present

## 2018-03-25 DIAGNOSIS — F23 Brief psychotic disorder: Secondary | ICD-10-CM | POA: Diagnosis not present

## 2018-03-25 DIAGNOSIS — R443 Hallucinations, unspecified: Secondary | ICD-10-CM | POA: Diagnosis not present

## 2018-03-25 LAB — COMPREHENSIVE METABOLIC PANEL
ALT: 24 U/L (ref 14–54)
ANION GAP: 9 (ref 5–15)
AST: 21 U/L (ref 15–41)
Albumin: 3.4 g/dL — ABNORMAL LOW (ref 3.5–5.0)
Alkaline Phosphatase: 186 U/L — ABNORMAL HIGH (ref 38–126)
BUN: 16 mg/dL (ref 6–20)
CHLORIDE: 106 mmol/L (ref 101–111)
CO2: 26 mmol/L (ref 22–32)
CREATININE: 0.99 mg/dL (ref 0.44–1.00)
Calcium: 8.8 mg/dL — ABNORMAL LOW (ref 8.9–10.3)
GFR calc non Af Amer: >60 mL/min (ref >60)
Glucose, Bld: 137 mg/dL — ABNORMAL HIGH (ref 65–99)
POTASSIUM: 4.2 mmol/L (ref 3.5–5.1)
SODIUM: 141 mmol/L (ref 135–145)
Total Bilirubin: 0.4 mg/dL (ref 0.3–1.2)
Total Protein: 7.7 g/dL (ref 6.5–8.1)

## 2018-03-25 LAB — I-STAT BETA HCG BLOOD, ED (MC, WL, AP ONLY): I-stat hCG, quantitative: <5 m[IU]/mL (ref <5)

## 2018-03-25 LAB — CBC
HEMATOCRIT: 35.3 % — AB (ref 36.0–46.0)
HEMOGLOBIN: 11.4 g/dL — AB (ref 12.0–15.0)
MCH: 30.3 pg (ref 26.0–34.0)
MCHC: 32.3 g/dL (ref 30.0–36.0)
MCV: 93.9 fL (ref 78.0–100.0)
Platelets: 273 10*3/uL (ref 150–400)
RBC: 3.76 MIL/uL — AB (ref 3.87–5.11)
RDW: 13.1 % (ref 11.5–15.5)
WBC: 6.5 10*3/uL (ref 4.0–10.5)

## 2018-03-25 LAB — ETHANOL: Alcohol, Ethyl (B): <10 mg/dL (ref <10)

## 2018-03-25 NOTE — ED Notes (Signed)
Pt walking briskly by nursing desk stating she is leaving because no one here believes anything she is saying  Pt states she will call her ride to pick her up but she is not staying here  PA, [employer] notified

## 2018-03-25 NOTE — ED Provider Notes (Signed)
[facility] DEPT Provider Note   CSN: [id number] Arrival date & time: 03/25/18  2231     History   Chief Complaint Chief Complaint  Patient presents with  . Paranoid  . Hallucinations    HPI KHYRA VISCUSO is a 47 y.o. female.  The history is provided by the patient and medical records.     47 year old female with history of anemia, anxiety, bipolar disorder, depression, diabetes, hypertension, mental retardation, prior stroke, sleep apnea, presenting to the ED for psychiatric evaluation.  Patient reports that there is a "bug" in her ear that was implanted a few weeks ago.  States it blows air out of her ear as she constantly hears people talking to her.  Multiple times during exam she is requesting to put my hand up to her ear so I can feel the air blowing.  Patient states she has not had much sleep.  States she is nervous because she thinks her pacemaker is recording her personal information and giving it to people on social media websites.  States she knows that there are which is that are watching her and monitoring everything she is doing.  No expressed SI/HI.  Past Medical History:  Diagnosis Date  . Anemia   . Anxiety   . Bipolar 1 disorder ([place])   . Common migraine 05/19/2015  . Depression   . Diabetes mellitus, type II ([place])   . Hypertension   . Irritable bowel syndrome (IBS)   . Mild mental retardation   . Obesity   . Partial complex seizure disorder with intractable epilepsy ([place]) 05/12/2014  . Seizures ([place])    intractable, sz 08/23/17  . Sleep apnea   . Stroke ([place])   . Type II or unspecified type diabetes mellitus without mention of complication, not stated as uncontrolled     Patient Active Problem List   Diagnosis Date Noted  . Vaginal odor 02/10/2018  . Fungal skin infection 02/10/2018  . Malodorous urine 10/08/2017  . Lower urinary tract symptoms (LUTS) 10/08/2017  . Acute UTI 10/08/2017  . Constipation 10/08/2017    . History of recent stroke 08/06/2017  . Angioedema 06/03/2017  . OSA (obstructive sleep apnea)   . Cryptogenic stroke ([place]) 05/20/2017  . Controlled type 2 diabetes mellitus without complication, without long-term current use of insulin ([place]) 05/08/2017  . Tiredness 05/13/2014  . Essential hypertension 07/27/2008  . Convulsions ([place]) 02/05/2007    Past Surgical History:  Procedure Laterality Date  . COLONOSCOPY     2012-normal , Dr Sharlett Iles  . ESOPHAGOGASTRODUODENOSCOPY     normal-Dr Patterson 2012  . LOOP RECORDER INSERTION N/A 05/30/2017   Procedure: Loop Recorder Insertion;  Surgeon: Constance Haw, MD;  Location: [facility];  Service: Cardiovascular;  Laterality: N/A;  . LOOP RECORDER REMOVAL N/A 03/04/2018   Procedure: LOOP RECORDER REMOVAL;  Surgeon: Constance Haw, MD;  Location: [facility];  Service: Cardiovascular;  Laterality: N/A;  . MYRINGOTOMY WITH TUBE PLACEMENT    . MYRINGOTOMY WITH TUBE PLACEMENT Right 11/05/2017   Procedure: MYRINGOTOMY WITH TUBE PLACEMENT;  Surgeon: Melissa Montane, MD;  Location: [facility];  Service: ENT;  Laterality: Right;  right T Tube placement  . NASAL SINUS SURGERY    . TEE WITHOUT CARDIOVERSION N/A 05/30/2017   Procedure: TRANSESOPHAGEAL ECHOCARDIOGRAM (TEE);  Surgeon: Acie Fredrickson Wonda Cheng, MD;  Location: [facility] ENDOSCOPY;  Service: Cardiovascular;  Laterality: N/A;     OB History   None  Home Medications    Prior to Admission medications   Medication Sig Start Date End Date Taking? Authorizing Provider  aspirin 325 MG tablet Take 1 tablet (325 mg total) by mouth daily. 05/23/17  Yes Barton Dubois, MD  atorvastatin (LIPITOR) 10 MG tablet Take 1 tablet (10 mg total) by mouth daily at 6 PM. Patient taking differently: Take 10 mg by mouth daily.  12/23/17 03/25/18 Yes Sagardia, Ines Bloomer, MD  carvedilol (COREG) 3.125 MG tablet TAKE 1 TABLET BY MOUTH TWICE A DAY 03/05/18  Yes Sagardia, Ines Bloomer, MD   clotrimazole-betamethasone (LOTRISONE) cream Apply to affected area 2 times daily prn 03/14/18  Yes Blue, Olivia C, PA-C  dicyclomine (BENTYL) 10 MG capsule Take 1 capsule (10 mg total) by mouth 3 (three) times daily before meals. 11/04/17  Yes Danis, Kirke Corin, MD  DULoxetine (CYMBALTA) 30 MG capsule Take 1 capsule (30 mg total) by mouth daily. 08/06/17 03/25/18 Yes Sagardia, Ines Bloomer, MD  ibuprofen (ADVIL,MOTRIN) 200 MG tablet Take 400 mg by mouth every 6 (six) hours as needed for headache or mild pain.   Yes [provider]  levETIRAcetam (KEPPRA) 500 MG tablet Take 1 tablet (500 mg total) by mouth 2 (two) times daily. 11/03/17  Yes Ward Givens, NP  metFORMIN (GLUCOPHAGE) 500 MG tablet Take 1 tablet (500 mg total) by mouth 2 (two) times daily with a meal. 02/10/18  Yes Sagardia, Ines Bloomer, MD  nystatin (MYCOSTATIN/NYSTOP) powder Apply topically 2 (two) times daily. 03/14/18  Yes Blue, Olivia C, PA-C  phenytoin (DILANTIN) 100 MG ER capsule TAKE 2 CAPSULES IN THE MORNING AND 3 CAPSULES IN THE EVENING 11/28/17  Yes [name], MD  sitaGLIPtin (JANUVIA) 50 MG tablet Take 1 tablet (50 mg total) by mouth daily. For control of blood sugar 05/08/17  Yes Sagardia, Ines Bloomer, MD  torsemide (DEMADEX) 20 MG tablet Take 1 tablet (20 mg total) by mouth daily. 05/08/17  Yes Sagardia, Ines Bloomer, MD  ACCU-CHEK SOFTCLIX LANCETS lancets Use as instructed 06/06/17   Horald Pollen, MD  docusate sodium (COLACE) 100 MG capsule Take 1 capsule (100 mg total) by mouth every 12 (twelve) hours. 12/02/17   Delia Heady, PA-C  glucose blood test strip Use as instructed 06/06/17   Horald Pollen, MD    Family History Family History  Problem Relation Age of Onset  . Diabetes Mother        passed away from accidental death  . Hypertension Mother   . Bipolar disorder Father   . Diabetes Daughter   . Leukemia Daughter   . Ovarian cancer Maternal Aunt     Social History Social History    Tobacco Use  . Smoking status: Never Smoker  . Smokeless tobacco: Never Used  Substance Use Topics  . Alcohol use: No  . Drug use: No     Allergies   Amoxicillin; Lisinopril; and Hydrocodone   Review of Systems Review of Systems  Psychiatric/Behavioral: Positive for hallucinations.  All other systems reviewed and are negative.    Physical Exam Updated Vital Signs BP 129/72 (BP Location: Left Arm)   Pulse 77   Temp 98.7 F (37.1 C) (Oral)   Resp 18   Ht 5\' 1"  (1.549 m)   Wt 114.3 kg (252 lb)   SpO2 100%   BMI 47.61 kg/m   Physical Exam  Constitutional: She is oriented to person, place, and time. She appears well-developed and well-nourished.  HENT:  Head: Normocephalic and atraumatic.  Mouth/Throat: Oropharynx is clear and moist.  Eyes: Pupils are equal, round, and reactive to light. Conjunctivae and EOM are normal.  Neck: Normal range of motion.  Cardiovascular: Normal rate, regular rhythm and normal heart sounds.  Pulmonary/Chest: Effort normal and breath sounds normal. No stridor. No respiratory distress.  Abdominal: Soft. Bowel sounds are normal. There is no tenderness. There is no rebound.  Musculoskeletal: Normal range of motion.  Neurological: She is alert and oriented to person, place, and time.  Skin: Skin is warm and dry.  Psychiatric: She expresses no homicidal and no suicidal ideation. She expresses no suicidal plans and no homicidal plans.  Odd affect  Nursing note and vitals reviewed.    ED Treatments / Results  Labs (all labs ordered are listed, but only abnormal results are displayed) Labs Reviewed  CBC - Abnormal; Notable for the following components:      Result Value   RBC 3.76 (*)    Hemoglobin 11.4 (*)    HCT 35.3 (*)    All other components within normal limits  COMPREHENSIVE METABOLIC PANEL  ETHANOL  RAPID URINE DRUG SCREEN, HOSP PERFORMED  I-STAT BETA HCG BLOOD, ED ([facility], [facility], [facility] ONLY)    EKG None  Radiology No results  found.  Procedures Procedures (including critical care time)  Medications Ordered in ED Medications - No data to display   Initial Impression / Assessment and Plan / ED Course  I have reviewed the triage vital signs and the nursing notes.  Pertinent labs & imaging results that were available during my care of the patient were reviewed by me and considered in my medical decision making (see chart for details).  47 year old female here with paranoid delusions.  Feels that her pacemaker is transmitting her personal information for social media websites and that there is a "electronic bug" in her ear and that is why she hears voices all the time.  She does not express any suicidal or homicidal ideation.  Labs sent.  Will plan for TTS consult.  11:23 PM Notified by triage RN that patient decided to leave shortly after I examined her.  States "no one believes me".  She has not expressed and SI/HI here.  Has some paranoid delusiond but does not seem to be a danger to herself or others at this time.  Encouraged to follow-up with PCP.  Labs work was collected but not resulted at this time.  Final Clinical Impressions(s) / ED Diagnoses   Final diagnoses:  Paranoia [facility])    ED Discharge Orders    None       Larene Pickett, PA-C 03/26/18 0010    Molpus, Jenny Reichmann, MD 03/26/18 [phone]

## 2018-03-25 NOTE — ED Triage Notes (Signed)
Per EMS: PT is paranoid and that she has a "smart bug" that the Nurses "implanted something inside her". Pt told EMS that she will not allow anyone at the hospital to draw any blood, check anything, or "stick her with anything".  Pt has not been sleeping much and believes the pacemaker she has is transmitting her personal information to witches on social media.

## 2018-03-26 ENCOUNTER — Telehealth: Payer: Self-pay | Admitting: Neurology

## 2018-03-26 ENCOUNTER — Ambulatory Visit: Payer: Medicare HMO | Admitting: Neurology

## 2018-03-26 NOTE — Telephone Encounter (Signed)
This patient did not show for a revisit appointment today. 

## 2018-03-27 ENCOUNTER — Encounter: Payer: Self-pay | Admitting: Neurology

## 2018-04-07 ENCOUNTER — Encounter: Payer: Self-pay | Admitting: Cardiology

## 2018-04-12 ENCOUNTER — Emergency Department (HOSPITAL_COMMUNITY): Payer: Medicare HMO

## 2018-04-12 ENCOUNTER — Emergency Department (HOSPITAL_COMMUNITY)
Admission: EM | Admit: 2018-04-12 | Discharge: 2018-04-12 | Disposition: A | Discharge status: Home / Self Care | Payer: Medicare HMO | Attending: Emergency Medicine | Admitting: Emergency Medicine

## 2018-04-12 ENCOUNTER — Encounter (HOSPITAL_COMMUNITY): Payer: Self-pay | Admitting: Emergency Medicine

## 2018-04-12 DIAGNOSIS — E119 Type 2 diabetes mellitus without complications: Secondary | ICD-10-CM | POA: Insufficient documentation

## 2018-04-12 DIAGNOSIS — R569 Unspecified convulsions: Secondary | ICD-10-CM | POA: Diagnosis not present

## 2018-04-12 DIAGNOSIS — R4781 Slurred speech: Secondary | ICD-10-CM | POA: Diagnosis not present

## 2018-04-12 DIAGNOSIS — F79 Unspecified intellectual disabilities: Secondary | ICD-10-CM | POA: Insufficient documentation

## 2018-04-12 DIAGNOSIS — R410 Disorientation, unspecified: Secondary | ICD-10-CM | POA: Diagnosis not present

## 2018-04-12 DIAGNOSIS — I6789 Other cerebrovascular disease: Secondary | ICD-10-CM | POA: Diagnosis not present

## 2018-04-12 DIAGNOSIS — G40909 Epilepsy, unspecified, not intractable, without status epilepticus: Secondary | ICD-10-CM | POA: Diagnosis not present

## 2018-04-12 DIAGNOSIS — Z7984 Long term (current) use of oral hypoglycemic drugs: Secondary | ICD-10-CM | POA: Insufficient documentation

## 2018-04-12 DIAGNOSIS — I10 Essential (primary) hypertension: Secondary | ICD-10-CM | POA: Insufficient documentation

## 2018-04-12 DIAGNOSIS — Z79899 Other long term (current) drug therapy: Secondary | ICD-10-CM | POA: Insufficient documentation

## 2018-04-12 LAB — CBC WITH DIFFERENTIAL/PLATELET
BASOS ABS: 0 10*3/uL (ref 0.0–0.1)
Basophils Relative: 0 %
Eosinophils Absolute: 0.1 10*3/uL (ref 0.0–0.7)
Eosinophils Relative: 1 %
HCT: 34.1 % — ABNORMAL LOW (ref 36.0–46.0)
HEMOGLOBIN: 11.4 g/dL — AB (ref 12.0–15.0)
LYMPHS ABS: 1.4 10*3/uL (ref 0.7–4.0)
LYMPHS PCT: 24 %
MCH: 30.7 pg (ref 26.0–34.0)
MCHC: 33.4 g/dL (ref 30.0–36.0)
MCV: 91.9 fL (ref 78.0–100.0)
Monocytes Absolute: 0.3 10*3/uL (ref 0.1–1.0)
Monocytes Relative: 5 %
NEUTROS ABS: 4.2 10*3/uL (ref 1.7–7.7)
NEUTROS PCT: 70 %
PLATELETS: 214 10*3/uL (ref 150–400)
RBC: 3.71 MIL/uL — ABNORMAL LOW (ref 3.87–5.11)
RDW: 12.7 % (ref 11.5–15.5)
WBC: 6 10*3/uL (ref 4.0–10.5)

## 2018-04-12 LAB — COMPREHENSIVE METABOLIC PANEL
ALK PHOS: 148 U/L — AB (ref 38–126)
ALT: 37 U/L (ref 14–54)
AST: 41 U/L (ref 15–41)
Albumin: 3.1 g/dL — ABNORMAL LOW (ref 3.5–5.0)
Anion gap: 8 (ref 5–15)
BILIRUBIN TOTAL: 0.4 mg/dL (ref 0.3–1.2)
BUN: 10 mg/dL (ref 6–20)
CALCIUM: 8.5 mg/dL — AB (ref 8.9–10.3)
CO2: 25 mmol/L (ref 22–32)
Chloride: 107 mmol/L (ref 101–111)
Creatinine, Ser: 1.03 mg/dL — ABNORMAL HIGH (ref 0.44–1.00)
GFR calc Af Amer: >60 mL/min (ref >60)
GLUCOSE: 164 mg/dL — AB (ref 65–99)
Potassium: 3.5 mmol/L (ref 3.5–5.1)
Sodium: 140 mmol/L (ref 135–145)
TOTAL PROTEIN: 7 g/dL (ref 6.5–8.1)

## 2018-04-12 LAB — URINALYSIS, ROUTINE W REFLEX MICROSCOPIC
BACTERIA UA: NONE SEEN
Bilirubin Urine: NEGATIVE
GLUCOSE, UA: 50 mg/dL — AB
KETONES UR: NEGATIVE mg/dL
NITRITE: NEGATIVE
PH: 6 (ref 5.0–8.0)
PROTEIN: 100 mg/dL — AB
RBC / HPF: >50 RBC/hpf — ABNORMAL HIGH (ref 0–5)
Specific Gravity, Urine: 1.027 (ref 1.005–1.030)

## 2018-04-12 LAB — I-STAT TROPONIN, ED: Troponin i, poc: 0.01 ng/mL (ref 0.00–0.08)

## 2018-04-12 NOTE — ED Notes (Signed)
Bed: F[facility]88 Expected date:  Expected time:  Means of arrival:  Comments: seizure

## 2018-04-12 NOTE — Discharge Instructions (Signed)
Please follow-up with both your neurologist and your primary doctor for further management of your seizures.  Please stay hydrated and take the medications they prescribed.  Please try and get some sleep as lack of sleep can lower your seizure threshold.  Your work-up today was overall reassuring.  If any symptoms change or worsen, please return to the nearest emergency department.

## 2018-04-12 NOTE — ED Provider Notes (Signed)
[address] DEPT Provider Note   CSN: [id number] Arrival date & time: 04/12/18  1754     History   Chief Complaint Chief Complaint  Patient presents with  . Seizures    HPI Brandi Gomez is a 47 y.o. female.  The history is provided by the patient and medical records.  Seizures   This is a new problem. The current episode started 1 to 2 hours ago. The problem has been resolved. There was 1 seizure. Associated symptoms include confusion. Pertinent negatives include no sleepiness, no headaches, no speech difficulty, no visual disturbance, no neck stiffness, no chest pain, no cough, no nausea, no vomiting, no diarrhea and no muscle weakness. Characteristics include bit tongue. The episode was not witnessed. The seizures did not continue in the ED. The seizure(s) had no focality. Possible causes include sleep deprivation and missed seizure meds. There has been no fever. There were no medications administered prior to arrival.    Past Medical History:  Diagnosis Date  . Anemia   . Anxiety   . Bipolar 1 disorder ([place])   . Common migraine 05/19/2015  . Depression   . Diabetes mellitus, type II ([place])   . Hypertension   . Irritable bowel syndrome (IBS)   . Mild mental retardation   . Obesity   . Partial complex seizure disorder with intractable epilepsy ([place]) 05/12/2014  . Seizures ([place])    intractable, sz 08/23/17  . Sleep apnea   . Stroke ([place])   . Type II or unspecified type diabetes mellitus without mention of complication, not stated as uncontrolled     Patient Active Problem List   Diagnosis Date Noted  . Vaginal odor 02/10/2018  . Fungal skin infection 02/10/2018  . Malodorous urine 10/08/2017  . Lower urinary tract symptoms (LUTS) 10/08/2017  . Acute UTI 10/08/2017  . Constipation 10/08/2017  . History of recent stroke 08/06/2017  . Angioedema 06/03/2017  . OSA (obstructive sleep apnea)   . Cryptogenic stroke ([place]) 05/20/2017  .  Controlled type 2 diabetes mellitus without complication, without long-term current use of insulin ([place]) 05/08/2017  . Tiredness 05/13/2014  . Essential hypertension 07/27/2008  . Convulsions ([place]) 02/05/2007    Past Surgical History:  Procedure Laterality Date  . COLONOSCOPY     2012-normal , Dr Sharlett Iles  . ESOPHAGOGASTRODUODENOSCOPY     normal-Dr Patterson 2012  . LOOP RECORDER INSERTION N/A 05/30/2017   Procedure: Loop Recorder Insertion;  Surgeon: Constance Haw, MD;  Location: [place] [facility];  Service: Cardiovascular;  Laterality: N/A;  . LOOP RECORDER REMOVAL N/A 03/04/2018   Procedure: LOOP RECORDER REMOVAL;  Surgeon: Constance Haw, MD;  Location: [facility];  Service: Cardiovascular;  Laterality: N/A;  . MYRINGOTOMY WITH TUBE PLACEMENT    . MYRINGOTOMY WITH TUBE PLACEMENT Right 11/05/2017   Procedure: MYRINGOTOMY WITH TUBE PLACEMENT;  Surgeon: Melissa Montane, MD;  Location: [facility];  Service: ENT;  Laterality: Right;  right T Tube placement  . NASAL SINUS SURGERY    . TEE WITHOUT CARDIOVERSION N/A 05/30/2017   Procedure: TRANSESOPHAGEAL ECHOCARDIOGRAM (TEE);  Surgeon: Acie Fredrickson Wonda Cheng, MD;  Location: [facility] END[facility];  Service: Cardiovascular;  Laterality: N/A;     OB History   None      Home Medications    Prior to Admission medications   Medication Sig Start Date End Date Taking? Authorizing Provider  ACCU-CHEK SOFTCLIX LANCETS lancets Use as instructed 06/06/17   Horald Pollen, MD  aspirin  325 MG tablet Take 1 tablet (325 mg total) by mouth daily. 05/23/17   Barton Dubois, MD  atorvastatin (LIPITOR) 10 MG tablet Take 1 tablet (10 mg total) by mouth daily at 6 PM. Patient taking differently: Take 10 mg by mouth daily.  12/23/17 03/25/18  Horald Pollen, MD  carvedilol (COREG) 3.125 MG tablet TAKE 1 TABLET BY MOUTH TWICE A DAY 03/05/18   Horald Pollen, MD  clotrimazole-betamethasone (LOTRISONE) cream Apply to affected area 2 times daily  prn 03/14/18   Blue, Olivia C, PA-C  dicyclomine (BENTYL) 10 MG capsule Take 1 capsule (10 mg total) by mouth 3 (three) times daily before meals. 11/04/17   Doran Stabler, MD  docusate sodium (COLACE) 100 MG capsule Take 1 capsule (100 mg total) by mouth every 12 (twelve) hours. 12/02/17   Khatri, Hina, PA-C  DULoxetine (CYMBALTA) 30 MG capsule Take 1 capsule (30 mg total) by mouth daily. 08/06/17 03/25/18  Horald Pollen, MD  glucose blood test strip Use as instructed 06/06/17   Horald Pollen, MD  ibuprofen (ADVIL,MOTRIN) 200 MG tablet Take 400 mg by mouth every 6 (six) hours as needed for headache or mild pain.    [provider]  levETIRAcetam (KEPPRA) 500 MG tablet Take 1 tablet (500 mg total) by mouth 2 (two) times daily. 11/03/17   Ward Givens, NP  metFORMIN (GLUCOPHAGE) 500 MG tablet Take 1 tablet (500 mg total) by mouth 2 (two) times daily with a meal. 02/10/18   Sagardia, Ines Bloomer, MD  nystatin (MYCOSTATIN/NYSTOP) powder Apply topically 2 (two) times daily. 03/14/18   Blue, Olivia C, PA-C  phenytoin (DILANTIN) 100 MG ER capsule TAKE 2 CAPSULES IN THE MORNING AND 3 CAPSULES IN THE EVENING 11/28/17   [name], MD  sitaGLIPtin (JANUVIA) 50 MG tablet Take 1 tablet (50 mg total) by mouth daily. For control of blood sugar 05/08/17   Sagardia, Ines Bloomer, MD  torsemide (DEMADEX) 20 MG tablet Take 1 tablet (20 mg total) by mouth daily. 05/08/17   Horald Pollen, MD    Family History Family History  Problem Relation Age of Onset  . Diabetes Mother        passed away from accidental death  . Hypertension Mother   . Bipolar disorder Father   . Diabetes Daughter   . Leukemia Daughter   . Ovarian cancer Maternal Aunt     Social History Social History   Tobacco Use  . Smoking status: Never Smoker  . Smokeless tobacco: Never Used  Substance Use Topics  . Alcohol use: No  . Drug use: No     Allergies   Amoxicillin; Lisinopril; and  Hydrocodone   Review of Systems Review of Systems  Constitutional: Negative for chills, diaphoresis, fatigue and fever.  HENT: Negative for congestion, dental problem and voice change.        Bit tongue  Eyes: Negative for photophobia and visual disturbance.  Respiratory: Negative for cough, chest tightness, shortness of breath and stridor.   Cardiovascular: Negative for chest pain, palpitations and leg swelling.  Gastrointestinal: Negative for abdominal pain, constipation, diarrhea, nausea and vomiting.  Genitourinary: Negative for dysuria and flank pain.  Musculoskeletal: Negative for back pain, neck pain and neck stiffness.  Skin: Negative for rash and wound.  Neurological: Positive for seizures. Negative for syncope, speech difficulty, weakness, light-headedness, numbness and headaches.  Psychiatric/Behavioral: Positive for confusion. Negative for agitation.  All other systems reviewed and are negative.  Physical Exam Updated Vital Signs There were no vitals taken for this visit.  Physical Exam  Constitutional: She is oriented to person, place, and time. She appears well-developed and well-nourished. No distress.  HENT:  Right Ear: External ear normal.  Left Ear: External ear normal.  Nose: Nose normal.  Mouth/Throat: No lacerations. No oropharyngeal exudate.    Eyes: Pupils are equal, round, and reactive to light. Conjunctivae and EOM are normal.  Neck: Normal range of motion. Neck supple.  Cardiovascular: Normal rate, regular rhythm and intact distal pulses.  No murmur heard. Pulmonary/Chest: Effort normal and breath sounds normal. No stridor. No respiratory distress. She has no wheezes. She exhibits no tenderness.  Abdominal: Soft. There is no tenderness.  Musculoskeletal: She exhibits no edema or tenderness.  Lymphadenopathy:    She has no cervical adenopathy.  Neurological: She is alert and oriented to person, place, and time. She is not disoriented. She displays  no tremor. No cranial nerve deficit or sensory deficit. She exhibits normal muscle tone. Coordination normal. GCS eye subscore is 4. GCS verbal subscore is 5. GCS motor subscore is 6.  Skin: Skin is warm and dry. Capillary refill takes less than 2 seconds. No rash noted. No erythema.  Psychiatric: She has a normal mood and affect.  Nursing note and vitals reviewed.    ED Treatments / Results  Labs (all labs ordered are listed, but only abnormal results are displayed) Labs Reviewed  CBC WITH DIFFERENTIAL/PLATELET - Abnormal; Notable for the following components:      Result Value   RBC 3.71 (*)    Hemoglobin 11.4 (*)    HCT 34.1 (*)    All other components within normal limits  COMPREHENSIVE METABOLIC PANEL - Abnormal; Notable for the following components:   Glucose, Bld 164 (*)    Creatinine, Ser 1.03 (*)    Calcium 8.5 (*)    Albumin 3.1 (*)    Alkaline Phosphatase 148 (*)    All other components within normal limits  URINALYSIS, ROUTINE W REFLEX MICROSCOPIC - Abnormal; Notable for the following components:   APPearance CLOUDY (*)    Glucose, UA 50 (*)    Hgb urine dipstick LARGE (*)    Protein, ur 100 (*)    Leukocytes, UA TRACE (*)    RBC / HPF >50 (*)    All other components within normal limits  URINE CULTURE  PREGNANCY, URINE  POC URINE PREG, ED  I-STAT TROPONIN, ED    EKG EKG Interpretation  Date/Time:  Sunday Apr 12 2018 18:38:27 EDT Ventricular Rate:  77 PR Interval:    QRS Duration: 101 QT Interval:  400 QTC Calculation: 453 R Axis:   59 Text Interpretation:  Sinus rhythm When compared to prior, no significant changes seen.   No STEMI Confirmed by Antony Blackbird [phone]) on 04/12/2018 6:44:02 PM   Radiology Dg Chest 2 View  Result Date: 04/12/2018 CLINICAL DATA:  Seizure. EXAM: CHEST - 2 VIEW COMPARISON:  02/22/2018. FINDINGS: Trachea is midline. Heart size is stable. Lungs are clear. No pleural fluid. IMPRESSION: No acute findings. Electronically Signed    By: Lorin Picket M.D.   On: 04/12/2018 19:21    Procedures Procedures (including critical care time)  Medications Ordered in ED Medications - No data to display   Initial Impression / Assessment and Plan / ED Course  I have reviewed the triage vital signs and the nursing notes.  Pertinent labs & imaging results that were available during my care of  the patient were reviewed by me and considered in my medical decision making (see chart for details).     SHUNA TABOR is a 47 y.o. female with a past medical history significant for anxiety, anemia, depression, seizures, prior stroke, diabetes who presents with a seizure.  Patient reports that she has not been sleeping well for the last week.  She reports she is only getting several hours a night.  She reports that she went to bed very late last night and woke up at around 5 PM and thinks she had a seizure.  She reports feeling groggy and sleepy when she woke up and had bit her tongue.  She denies any fevers, chills, headache, neck pain or neck stiffness.  She reports that she has not taken her seizure medicines for the last week.  She reports that she took a Dilantin and a Keppra pill prior to coming to the emergency department.  She says that she does not drink alcohol or use other drugs.  She reports that while waiting in the waiting room she had several minutes of chest pain that has resolved.  She denies any nausea, vomiting, constipation, diarrhea, or any urinary symptoms.  She denies any complaints otherwise.  On exam, patient has a small tongue abrasion.  No laceration was seen that needs repair.  Neurologic exam is grossly unremarkable.  Patient had normal sensation and strength in all extremes.  No facial droop.  Pupils were reactive and symmetric bilaterally.  Patient was alert and oriented.  Lungs clear chest nontender.  Abdomen nontender.  Normal range of motion of neck.  Patient will work-up to look for other cause of her  seizure however I suspect it was due to lack of sleep and lack of seizure medications.  Patient work-up to look for occult infection in the lungs and urine.  Given the patient's stroke with the last 6 months, patient receiving conversation was felt as to getting a CT scan of her head or not.  With the lack of neurologic deficits and lack of headaches, I doubt patient had a hemorrhagic conversion or other intracerebral injury.  She denies recent trauma.  Patient agreed to hold on imaging unless new symptoms arise.  Patient already took her seizure medicines, will hold on giving her other medications or wait for work-up to result.   Anticipate discharge if work-up is reassuring.  10:21 PM Patient's work-up does appear reassuring.  Laboratory testing was overall similar to prior.  Patient was observed for several hours and had no further seizures.  Patient was able to eat and drink without difficulty.  I suspect the seizure was due to lack of sleep and lack of medications.  Patient has shown that she will take the medications that she took them before coming.  I do not think she needs to have other medications administered at this time.  Given the lack of other symptoms do not feel she needs head CT at this time.  Patient will follow-up with PCP and neurologist.  Patient understood return precautions and had no other worsens or concerns.  Patient discharged in good condition.   Final Clinical Impressions(s) / ED Diagnoses   Final diagnoses:  Seizure [facility])    ED Discharge Orders    None      Clinical Impression: 1. Seizure [facility])     Disposition: Discharge  Condition: Good  I have discussed the results, Dx and Tx plan with the pt(& family if present). He/she/they expressed understanding and  agree(s) with the plan. Discharge instructions discussed at great length. Strict return precautions discussed and pt &/or family have verbalized understanding of the instructions. No further  questions at time of discharge.    New Prescriptions   No medications on file    Follow Up: Horald Pollen, MD [place] [address] [phone]     [place] DEPT [address] [id number] [place]       Tegeler, Gwenyth Allegra, MD 04/12/18 2252

## 2018-04-12 NOTE — ED Triage Notes (Signed)
Transported by [facility] from home-- significant other told EMS that patient has not taken her depression medication within the last 5 months, and has not taken her seizure or bipolar medication in 8 days. Patient did not have a witnessed seizure but family believes she had a seizure because patient woke up "confused" and with oral trauma (bit her tongue). VSS with EMS. Pt ambulatory upon arrival, and only c/o mouth pain.

## 2018-04-12 NOTE — ED Notes (Signed)
ED Provider at bedside. 

## 2018-04-13 ENCOUNTER — Emergency Department (HOSPITAL_COMMUNITY)
Admission: EM | Admit: 2018-04-13 | Discharge: 2018-04-15 | Disposition: A | Discharge status: Home / Self Care | Payer: Medicare HMO | Attending: Emergency Medicine | Admitting: Emergency Medicine

## 2018-04-13 ENCOUNTER — Encounter (HOSPITAL_COMMUNITY): Payer: Self-pay

## 2018-04-13 DIAGNOSIS — Z7984 Long term (current) use of oral hypoglycemic drugs: Secondary | ICD-10-CM | POA: Diagnosis not present

## 2018-04-13 DIAGNOSIS — I10 Essential (primary) hypertension: Secondary | ICD-10-CM | POA: Insufficient documentation

## 2018-04-13 DIAGNOSIS — F319 Bipolar disorder, unspecified: Secondary | ICD-10-CM | POA: Insufficient documentation

## 2018-04-13 DIAGNOSIS — F29 Unspecified psychosis not due to a substance or known physiological condition: Secondary | ICD-10-CM | POA: Insufficient documentation

## 2018-04-13 DIAGNOSIS — R443 Hallucinations, unspecified: Secondary | ICD-10-CM | POA: Diagnosis not present

## 2018-04-13 DIAGNOSIS — E119 Type 2 diabetes mellitus without complications: Secondary | ICD-10-CM | POA: Insufficient documentation

## 2018-04-13 DIAGNOSIS — R638 Other symptoms and signs concerning food and fluid intake: Secondary | ICD-10-CM | POA: Diagnosis not present

## 2018-04-13 DIAGNOSIS — Z818 Family history of other mental and behavioral disorders: Secondary | ICD-10-CM | POA: Diagnosis not present

## 2018-04-13 DIAGNOSIS — F329 Major depressive disorder, single episode, unspecified: Secondary | ICD-10-CM | POA: Diagnosis present

## 2018-04-13 DIAGNOSIS — F419 Anxiety disorder, unspecified: Secondary | ICD-10-CM | POA: Diagnosis not present

## 2018-04-13 DIAGNOSIS — Z7982 Long term (current) use of aspirin: Secondary | ICD-10-CM | POA: Diagnosis not present

## 2018-04-13 DIAGNOSIS — Z79899 Other long term (current) drug therapy: Secondary | ICD-10-CM | POA: Insufficient documentation

## 2018-04-13 DIAGNOSIS — G40909 Epilepsy, unspecified, not intractable, without status epilepticus: Secondary | ICD-10-CM | POA: Diagnosis not present

## 2018-04-13 DIAGNOSIS — Z736 Limitation of activities due to disability: Secondary | ICD-10-CM | POA: Diagnosis not present

## 2018-04-13 DIAGNOSIS — R4587 Impulsiveness: Secondary | ICD-10-CM | POA: Diagnosis not present

## 2018-04-13 DIAGNOSIS — R450 Nervousness: Secondary | ICD-10-CM | POA: Diagnosis not present

## 2018-04-13 LAB — ETHANOL

## 2018-04-13 LAB — COMPREHENSIVE METABOLIC PANEL
ALT: 44 U/L (ref 14–54)
ANION GAP: 11 (ref 5–15)
AST: 38 U/L (ref 15–41)
Albumin: 3.3 g/dL — ABNORMAL LOW (ref 3.5–5.0)
Alkaline Phosphatase: 143 U/L — ABNORMAL HIGH (ref 38–126)
BUN: 10 mg/dL (ref 6–20)
CHLORIDE: 104 mmol/L (ref 101–111)
CO2: 26 mmol/L (ref 22–32)
Calcium: 8.7 mg/dL — ABNORMAL LOW (ref 8.9–10.3)
Creatinine, Ser: 0.95 mg/dL (ref 0.44–1.00)
Glucose, Bld: 111 mg/dL — ABNORMAL HIGH (ref 65–99)
POTASSIUM: 3.5 mmol/L (ref 3.5–5.1)
Sodium: 141 mmol/L (ref 135–145)
Total Bilirubin: 0.8 mg/dL (ref 0.3–1.2)
Total Protein: 7.1 g/dL (ref 6.5–8.1)

## 2018-04-13 LAB — CBC
HCT: 34.1 % — ABNORMAL LOW (ref 36.0–46.0)
HEMOGLOBIN: 11.3 g/dL — AB (ref 12.0–15.0)
MCH: 30.7 pg (ref 26.0–34.0)
MCHC: 33.1 g/dL (ref 30.0–36.0)
MCV: 92.7 fL (ref 78.0–100.0)
Platelets: 216 10*3/uL (ref 150–400)
RBC: 3.68 MIL/uL — AB (ref 3.87–5.11)
RDW: 12.7 % (ref 11.5–15.5)
WBC: 5.7 10*3/uL (ref 4.0–10.5)

## 2018-04-13 LAB — ACETAMINOPHEN LEVEL: Acetaminophen (Tylenol), Serum: <10 ug/mL — ABNORMAL LOW (ref 10–30)

## 2018-04-13 LAB — SALICYLATE LEVEL: Salicylate Lvl: <7 mg/dL (ref 2.8–30.0)

## 2018-04-13 NOTE — ED Provider Notes (Signed)
[facility] DE[facility] Provider Note   CSN: [id number] Arrival date & time: 04/13/18  2240     History   Chief Complaint Chief Complaint  Patient presents with  . Hallucinations    HPI Brandi Gomez is a 47 y.o. female.  Patient brought to emergency department by family over concerns of her hyperreligiosity and refusing to take her medications.  She has a history of bipolar disorder and seizure disorder.  Patient refusing to take medications for these problems because she has become convinced that God and the Bible are for beating her to take the medications.  She reports that it is God's will that she not take her medications.  She has had multiple seizures over the last 8 days or so that she has not taken her seizure medication.  Patient does not endorse suicidality or homicidality.  She reports that she has not been eating over the last couple of days and has not slept for several days.     Past Medical History:  Diagnosis Date  . Anemia   . Anxiety   . Bipolar 1 disorder ([place])   . Common migraine 05/19/2015  . Depression   . Diabetes mellitus, type II ([place])   . Hypertension   . Irritable bowel syndrome (IBS)   . Mild mental retardation   . Obesity   . Partial complex seizure disorder with intractable epilepsy ([place]) 05/12/2014  . Seizures ([place])    intractable, sz 08/23/17  . Sleep apnea   . Stroke ([place])   . Type II or unspecified type diabetes mellitus without mention of complication, not stated as uncontrolled     Patient Active Problem List   Diagnosis Date Noted  . Vaginal odor 02/10/2018  . Fungal skin infection 02/10/2018  . Malodorous urine 10/08/2017  . Lower urinary tract symptoms (LUTS) 10/08/2017  . Acute UTI 10/08/2017  . Constipation 10/08/2017  . History of recent stroke 08/06/2017  . Angioedema 06/03/2017  . OSA (obstructive sleep apnea)   . Cryptogenic stroke ([place]) 05/20/2017  . Controlled type 2 diabetes mellitus  without complication, without long-term current use of insulin ([place]) 05/08/2017  . Tiredness 05/13/2014  . Essential hypertension 07/27/2008  . Convulsions ([place]) 02/05/2007    Past Surgical History:  Procedure Laterality Date  . COLONOSCOPY     2012-normal , Dr Sharlett Iles  . ESOPHAGOGASTRODUODENOSCOPY     normal-Dr Patterson 2012  . LOOP RECORDER INSERTION N/A 05/30/2017   Procedure: Loop Recorder Insertion;  Surgeon: Constance Haw, MD;  Location: [place] [facility];  Service: Cardiovascular;  Laterality: N/A;  . LOOP RECORDER REMOVAL N/A 03/04/2018   Procedure: LOOP RECORDER REMOVAL;  Surgeon: Constance Haw, MD;  Location: [place] [facility];  Service: Cardiovascular;  Laterality: N/A;  . MYRINGOTOMY WITH TUBE PLACEMENT    . MYRINGOTOMY WITH TUBE PLACEMENT Right 11/05/2017   Procedure: MYRINGOTOMY WITH TUBE PLACEMENT;  Surgeon: Melissa Montane, MD;  Location: [place];  Service: ENT;  Laterality: Right;  right T Tube placement  . NASAL SINUS SURGERY    . TEE WITHOUT CARDIOVERSION N/A 05/30/2017   Procedure: TRANSESOPHAGEAL ECHOCARDIOGRAM (TEE);  Surgeon: Acie Fredrickson Wonda Cheng, MD;  Location: [facility] ENDOSCOPY;  Service: Cardiovascular;  Laterality: N/A;     OB History   None      Home Medications    Prior to Admission medications   Medication Sig Start Date End Date Taking? Authorizing Provider  ACCU-CHEK SOFTCLIX LANCETS lancets Use as instructed 06/06/17  Yes  Horald Pollen, MD  aspirin 325 MG tablet Take 1 tablet (325 mg total) by mouth daily. 05/23/17  Yes Barton Dubois, MD  atorvastatin (LIPITOR) 10 MG tablet Take 1 tablet (10 mg total) by mouth daily at 6 PM. 12/23/17 04/13/18 Yes Sagardia, Ines Bloomer, MD  carvedilol (COREG) 3.125 MG tablet TAKE 1 TABLET BY MOUTH TWICE A DAY 03/05/18  Yes Sagardia, Ines Bloomer, MD  clotrimazole-betamethasone (LOTRISONE) cream Apply to affected area 2 times daily prn 03/14/18  Yes Blue, Olivia C, PA-C  dicyclomine (BENTYL) 10 MG capsule Take 1  capsule (10 mg total) by mouth 3 (three) times daily before meals. 11/04/17  Yes Danis, Kirke Corin, MD  docusate sodium (COLACE) 100 MG capsule Take 1 capsule (100 mg total) by mouth every 12 (twelve) hours. 12/02/17  Yes Khatri, Hina, PA-C  DULoxetine (CYMBALTA) 30 MG capsule Take 1 capsule (30 mg total) by mouth daily. 08/06/17 04/13/18 Yes Sagardia, Ines Bloomer, MD  glucose blood test strip Use as instructed 06/06/17  Yes Sagardia, Ines Bloomer, MD  ibuprofen (ADVIL,MOTRIN) 200 MG tablet Take 400 mg by mouth every 6 (six) hours as needed for headache or mild pain.   Yes [provider]  levETIRAcetam (KEPPRA) 500 MG tablet Take 1 tablet (500 mg total) by mouth 2 (two) times daily. 11/03/17  Yes Ward Givens, NP  metFORMIN (GLUCOPHAGE) 500 MG tablet Take 1 tablet (500 mg total) by mouth 2 (two) times daily with a meal. 02/10/18  Yes Sagardia, Ines Bloomer, MD  nystatin (MYCOSTATIN/NYSTOP) powder Apply topically 2 (two) times daily. 03/14/18  Yes Blue, Olivia C, PA-C  phenytoin (DILANTIN) 100 MG ER capsule TAKE 2 CAPSULES IN THE MORNING AND 3 CAPSULES IN THE EVENING 11/28/17  Yes [name], MD  sitaGLIPtin (JANUVIA) 50 MG tablet Take 1 tablet (50 mg total) by mouth daily. For control of blood sugar 05/08/17  Yes Sagardia, Ines Bloomer, MD  torsemide (DEMADEX) 20 MG tablet Take 1 tablet (20 mg total) by mouth daily. 05/08/17  Yes SagardiaInes Bloomer, MD    Family History Family History  Problem Relation Age of Onset  . Diabetes Mother        passed away from accidental death  . Hypertension Mother   . Bipolar disorder Father   . Diabetes Daughter   . Leukemia Daughter   . Ovarian cancer Maternal Aunt     Social History Social History   Tobacco Use  . Smoking status: Never Smoker  . Smokeless tobacco: Never Used  Substance Use Topics  . Alcohol use: No  . Drug use: No     Allergies   Amoxicillin; Lisinopril; and Hydrocodone   Review of Systems Review of Systems    Constitutional: Positive for appetite change.  Neurological: Positive for seizures.  Psychiatric/Behavioral: Positive for sleep disturbance.  All other systems reviewed and are negative.    Physical Exam Updated Vital Signs BP (!) 150/100 (BP Location: Left Arm)   Pulse 60   Temp 99.3 F (37.4 C) (Oral)   Resp 16   SpO2 96%   Physical Exam  Constitutional: She is oriented to person, place, and time. She appears well-developed and well-nourished. No distress.  HENT:  Head: Normocephalic and atraumatic.  Right Ear: Hearing normal.  Left Ear: Hearing normal.  Nose: Nose normal.  Mouth/Throat: Oropharynx is clear and moist and mucous membranes are normal. Lacerations (Abrasion left side of tongue) present.  Eyes: Pupils are equal, round, and reactive to light. Conjunctivae and  EOM are normal.  Neck: Normal range of motion. Neck supple.  Cardiovascular: Regular rhythm, S1 normal and S2 normal. Exam reveals no gallop and no friction rub.  No murmur heard. Pulmonary/Chest: Effort normal and breath sounds normal. No respiratory distress. She exhibits no tenderness.  Abdominal: Soft. Normal appearance and bowel sounds are normal. There is no hepatosplenomegaly. There is no tenderness. There is no rebound, no guarding, no tenderness at McBurney's point and negative Murphy's sign. No hernia.  Musculoskeletal: Normal range of motion.  Neurological: She is alert and oriented to person, place, and time. She has normal strength. No cranial nerve deficit or sensory deficit. Coordination normal. GCS eye subscore is 4. GCS verbal subscore is 5. GCS motor subscore is 6.  Skin: Skin is warm, dry and intact. No rash noted. No cyanosis.  Psychiatric: She has a normal mood and affect. Her speech is normal and behavior is normal. Thought content is delusional.  Nursing note and vitals reviewed.    ED Treatments / Results  Labs (all labs ordered are listed, but only abnormal results are  displayed) Labs Reviewed  COMPREHENSIVE METABOLIC PANEL - Abnormal; Notable for the following components:      Result Value   Glucose, Bld 111 (*)    Calcium 8.7 (*)    Albumin 3.3 (*)    Alkaline Phosphatase 143 (*)    All other components within normal limits  CBC - Abnormal; Notable for the following components:   RBC 3.68 (*)    Hemoglobin 11.3 (*)    HCT 34.1 (*)    All other components within normal limits  ETHANOL  SALICYLATE LEVEL  ACETAMINOPHEN LEVEL  RAPID URINE DRUG SCREEN, HOSP PERFORMED    EKG None  Radiology Dg Chest 2 View  Result Date: 04/12/2018 CLINICAL DATA:  Seizure. EXAM: CHEST - 2 VIEW COMPARISON:  02/22/2018. FINDINGS: Trachea is midline. Heart size is stable. Lungs are clear. No pleural fluid. IMPRESSION: No acute findings. Electronically Signed   By: Lorin Picket M.D.   On: 04/12/2018 19:21    Procedures Procedures (including critical care time)  Medications Ordered in ED Medications - No data to display   Initial Impression / Assessment and Plan / ED Course  I have reviewed the triage vital signs and the nursing notes.  Pertinent labs & imaging results that were available during my care of the patient were reviewed by me and considered in my medical decision making (see chart for details).     Patient brought to the ER by family because of concern for her not taking her medications.  Patient has been refusing medications because she feels that it is "God's will".  She tells me that it states specifically in the Bible that you cannot be religious and take seizure medicine or bipolar medicine.  She has no intention of restarting these medications, therefore is at risk for recurrent seizure.  IVC initiated and psychiatry evaluation will be needed.  Final Clinical Impressions(s) / ED Diagnoses   Final diagnoses:  Psychosis, unspecified psychosis type [facility])    ED Discharge Orders    None       Orpah Greek, MD 04/13/18 2329

## 2018-04-13 NOTE — ED Triage Notes (Signed)
Pt has a history of seizures and refuses to take her medications because it's against God's will and it will mess their relationship up

## 2018-04-13 NOTE — ED Notes (Signed)
Bed: WTR6 Expected date:  Expected time:  Means of arrival:  Comments: 

## 2018-04-14 DIAGNOSIS — F29 Unspecified psychosis not due to a substance or known physiological condition: Secondary | ICD-10-CM | POA: Diagnosis not present

## 2018-04-14 LAB — CBG MONITORING, ED
GLUCOSE-CAPILLARY: 70 mg/dL (ref 65–99)
GLUCOSE-CAPILLARY: 137 mg/dL — AB (ref 65–99)
GLUCOSE-CAPILLARY: 130 mg/dL — AB (ref 65–99)
Glucose-Capillary: 148 mg/dL — ABNORMAL HIGH (ref 65–99)
Glucose-Capillary: 104 mg/dL — ABNORMAL HIGH (ref 65–99)

## 2018-04-14 LAB — RAPID URINE DRUG SCREEN, HOSP PERFORMED
AMPHETAMINES: NOT DETECTED
BENZODIAZEPINES: NOT DETECTED
Barbiturates: NOT DETECTED
Cocaine: NOT DETECTED
OPIATES: NOT DETECTED
TETRAHYDROCANNABINOL: NOT DETECTED

## 2018-04-14 LAB — URINE CULTURE

## 2018-04-14 MED ORDER — ATORVASTATIN CALCIUM 10 MG PO TABS
10.0000 mg | ORAL_TABLET | Freq: Every day | ORAL | Status: DC
  Administered 2018-04-14: 10 mg via ORAL
  Filled 2018-04-14 (×2): qty 1

## 2018-04-14 MED ORDER — DULOXETINE HCL 30 MG PO CPEP
30.0000 mg | ORAL_CAPSULE | Freq: Every day | ORAL | Status: DC
  Administered 2018-04-14 – 2018-04-15 (×2): 30 mg via ORAL
  Filled 2018-04-14 (×2): qty 1

## 2018-04-14 MED ORDER — INSULIN ASPART 100 UNIT/ML ~~LOC~~ SOLN
0.0000 [IU] | Freq: Three times a day (TID) | SUBCUTANEOUS | Status: DC
  Administered 2018-04-14: 2 [IU] via SUBCUTANEOUS
  Administered 2018-04-15: 3 [IU] via SUBCUTANEOUS
  Filled 2018-04-14 (×2): qty 1

## 2018-04-14 MED ORDER — ALUM & MAG HYDROXIDE-SIMETH 200-200-20 MG/5ML PO SUSP: 30.0000 mL | Freq: Four times a day (QID) | ORAL | Status: DC | PRN

## 2018-04-14 MED ORDER — CLOTRIMAZOLE 1 % EX CREA
TOPICAL_CREAM | Freq: Two times a day (BID) | CUTANEOUS | Status: DC
  Administered 2018-04-14 – 2018-04-15 (×4): via TOPICAL
  Filled 2018-04-14 (×2): qty 15

## 2018-04-14 MED ORDER — LINAGLIPTIN 5 MG PO TABS
5.0000 mg | ORAL_TABLET | Freq: Every day | ORAL | Status: DC
  Administered 2018-04-14 – 2018-04-15 (×2): 5 mg via ORAL
  Filled 2018-04-14 (×3): qty 1

## 2018-04-14 MED ORDER — ACETAMINOPHEN 325 MG PO TABS: 650.0000 mg | ORAL_TABLET | ORAL | Status: DC | PRN

## 2018-04-14 MED ORDER — ZOLPIDEM TARTRATE 5 MG PO TABS: 5.0000 mg | ORAL_TABLET | Freq: Every evening | ORAL | Status: DC | PRN

## 2018-04-14 MED ORDER — CARVEDILOL 3.125 MG PO TABS
3.1250 mg | ORAL_TABLET | Freq: Two times a day (BID) | ORAL | Status: DC
  Administered 2018-04-14 – 2018-04-15 (×2): 3.125 mg via ORAL
  Filled 2018-04-14 (×5): qty 1

## 2018-04-14 MED ORDER — OLANZAPINE 10 MG PO TBDP: 10.0000 mg | ORAL_TABLET | Freq: Three times a day (TID) | ORAL | Status: DC | PRN

## 2018-04-14 MED ORDER — RISPERIDONE 0.5 MG PO TABS
0.5000 mg | ORAL_TABLET | Freq: Two times a day (BID) | ORAL | Status: DC
  Administered 2018-04-14 – 2018-04-15 (×3): 0.5 mg via ORAL
  Filled 2018-04-14 (×3): qty 1

## 2018-04-14 MED ORDER — ONDANSETRON HCL 4 MG PO TABS: 4.0000 mg | ORAL_TABLET | Freq: Three times a day (TID) | ORAL | Status: DC | PRN

## 2018-04-14 MED ORDER — DICYCLOMINE HCL 10 MG PO CAPS
10.0000 mg | ORAL_CAPSULE | Freq: Three times a day (TID) | ORAL | Status: DC
  Administered 2018-04-14 – 2018-04-15 (×3): 10 mg via ORAL
  Filled 2018-04-14 (×6): qty 1

## 2018-04-14 MED ORDER — PHENYTOIN SODIUM EXTENDED 100 MG PO CAPS
300.0000 mg | ORAL_CAPSULE | Freq: Every day | ORAL | Status: DC
  Administered 2018-04-14: 300 mg via ORAL
  Filled 2018-04-14: qty 3

## 2018-04-14 MED ORDER — ZIPRASIDONE MESYLATE 20 MG IM SOLR
20.0000 mg | INTRAMUSCULAR | Status: AC | PRN
  Administered 2018-04-14: 20 mg via INTRAMUSCULAR
  Filled 2018-04-14: qty 20

## 2018-04-14 MED ORDER — LEVETIRACETAM 500 MG PO TABS
500.0000 mg | ORAL_TABLET | Freq: Two times a day (BID) | ORAL | Status: DC
  Administered 2018-04-14 – 2018-04-15 (×3): 500 mg via ORAL
  Filled 2018-04-14 (×3): qty 1

## 2018-04-14 MED ORDER — LORAZEPAM 1 MG PO TABS: 1.0000 mg | ORAL_TABLET | ORAL | Status: DC | PRN

## 2018-04-14 MED ORDER — DOCUSATE SODIUM 100 MG PO CAPS
100.0000 mg | ORAL_CAPSULE | Freq: Two times a day (BID) | ORAL | Status: DC
  Administered 2018-04-15: 100 mg via ORAL
  Filled 2018-04-14 (×2): qty 1

## 2018-04-14 MED ORDER — METFORMIN HCL 500 MG PO TABS
500.0000 mg | ORAL_TABLET | Freq: Two times a day (BID) | ORAL | Status: DC
  Administered 2018-04-14 – 2018-04-15 (×3): 500 mg via ORAL
  Filled 2018-04-14 (×3): qty 1

## 2018-04-14 MED ORDER — STERILE WATER FOR INJECTION IJ SOLN
INTRAMUSCULAR | Status: AC
  Administered 2018-04-14: 06:00:00
  Filled 2018-04-14: qty 10

## 2018-04-14 MED ORDER — ASPIRIN 325 MG PO TABS
325.0000 mg | ORAL_TABLET | Freq: Every day | ORAL | Status: DC
  Administered 2018-04-14 – 2018-04-15 (×2): 325 mg via ORAL
  Filled 2018-04-14 (×2): qty 1

## 2018-04-14 MED ORDER — TORSEMIDE 20 MG PO TABS
20.0000 mg | ORAL_TABLET | Freq: Every day | ORAL | Status: DC
  Administered 2018-04-14 – 2018-04-15 (×2): 20 mg via ORAL
  Filled 2018-04-14 (×3): qty 1

## 2018-04-14 MED ORDER — PHENYTOIN SODIUM EXTENDED 100 MG PO CAPS
200.0000 mg | ORAL_CAPSULE | Freq: Every day | ORAL | Status: DC
  Administered 2018-04-14 – 2018-04-15 (×2): 200 mg via ORAL
  Filled 2018-04-14 (×2): qty 2

## 2018-04-14 NOTE — ED Notes (Signed)
Pt would not stop yelling, cursing and attempted to walk out again, when asked to go back to her room she refused and was again escorted to her room, pt continued to yell in the hallway, medication was given

## 2018-04-14 NOTE — ED Notes (Signed)
Pt refused all medications except the lotrimin creme

## 2018-04-14 NOTE — Progress Notes (Signed)
CSW received a call from Sherryl Manges at [facility] at ph :[phone] stating they can't accept pt is pt is actively experiencing seizures and Pamala Hurry states notes indicate that this is the case at this time.  Please reconsult if future social work needs arise.  CSW signing off, as social work intervention is no longer needed.  Alphonse Guild. Sidda Humm, LCSW, LCAS, CSI Clinical Social Worker Ph: [phone]

## 2018-04-14 NOTE — BH Assessment (Signed)
[facility] Assessment Progress Note  Per Buford Dresser, DO, this pt requires psychiatric hospitalization at this time.  The following facilities have been contacted to seek placement for this pt, with results as noted:  Beds available, information sent, decision pending:  [facility]   At capacity:  Santiago Bumpers, [place] Coordinator [phone]

## 2018-04-14 NOTE — ED Notes (Addendum)
Gave patient orange juice and graham crackers.  Will recheck BS.

## 2018-04-14 NOTE — ED Notes (Signed)
Pt A&O x 3, no distress noted, calm & cooperative at present. No seizure activity noted, Family visiting at present.  Monitoring for safety, Q 15 min checks in effect.

## 2018-04-14 NOTE — ED Notes (Signed)
Pt came out of Triage 6, grabbed her belongings and proceeded to walk towards the front exit. This Probation officer called [employer] and [employer] as I was following the pt outside. Because the pt is currently under IVC papers, [place] met myself and the pt outside of the front entrance. Pt was asked to come back inside multiple times by this Probation officer. This Probation officer continued to explain to the pt that because she is under IVC papers, she is not allowed to leave. Pt continued to yell and scream about wanting to leave under her own free will. Security repeatedly asked pt to walk back inside. Pt refused. Pt was placed into wheelchair by [facility] and wheeled back into her room.

## 2018-04-14 NOTE — ED Notes (Signed)
Patient placed in room #43 in Acute Care Unit.  Patient belongings moved to locker #43.  Patient alert, lying in bed calm and cooperative.  Offered patient some medications due with water.  She was willing to take them with some urging, but then when she tried to swallow them with water she couldn't get them down and spit them out.  Offered the medication with applesauce and patient was able to get them down.

## 2018-04-14 NOTE — BH Assessment (Addendum)
Assessment Note  Brandi Gomez is an 47 y.o. female.  -Clinician reviewed note from Dr. Betsey Holiday.  She has a history of bipolar disorder and seizure disorder.  Patient refusing to take medications for these problems because she has become convinced that God and the Bible are for beating her to take the medications.  She reports that it is God's will that she not take her medications.  She has had multiple seizures over the last 8 days or so that she has not taken her seizure medication.  Patient does not endorse suicidality or homicidality.  She reports that she has not been eating over the last couple of days and has not slept for several days  Patient lives with one of her daughters and their friends.  She said that her other daughter (that does not live with her) had gotten her to go to the hospital.  Patient says she does not understand why she is at the hospital.  She says that she feels good. She says she has been off her medications (which she does not remember all of them) for the last week or so.  She says that she wants to "do good and do what God wants me to do."  Patient says that taking medication is against the Bible and she is not going to take medications.  Patient denies any SI, HI or A/V hallucinations.  She denies any previous SI or attempts.  Patient denies use of ETOH or other illicit drugs.    Patient talks about wanting to check with her cousin's husband, who is a Company secretary, regarding the efficacy of medication.  Patient admits to having had a seizure since she went off her dilantin.  Patient says that she does not like side effects of her medications also.  She says "I just want to stay in my room and read my Bible and have peace and quiet.  Dr. Betsey Holiday initiated IVC papers due to patient not making sound decisions regarding her healthcare.  Clinician discussed patient care with [name], PA who recommends inpatient psychiatric care.  TTS to seek placement.  Dr. Betsey Holiday  informed.  Diagnosis: Bipolar 1 d/o  Past Medical History:  Past Medical History:  Diagnosis Date  . Anemia   . Anxiety   . Bipolar 1 disorder ([place])   . Common migraine 05/19/2015  . Depression   . Diabetes mellitus, type II ([place])   . Hypertension   . Irritable bowel syndrome (IBS)   . Mild mental retardation   . Obesity   . Partial complex seizure disorder with intractable epilepsy ([place]) 05/12/2014  . Seizures ([place])    intractable, sz 08/23/17  . Sleep apnea   . Stroke ([place])   . Type II or unspecified type diabetes mellitus without mention of complication, not stated as uncontrolled     Past Surgical History:  Procedure Laterality Date  . COLONOSCOPY     2012-normal , Dr Sharlett Iles  . ESOPHAGOGASTRODUODENOSCOPY     normal-Dr Patterson 2012  . LOOP RECORDER INSERTION N/A 05/30/2017   Procedure: Loop Recorder Insertion;  Surgeon: Constance Haw, MD;  Location: [facility];  Service: Cardiovascular;  Laterality: N/A;  . LOOP RECORDER REMOVAL N/A 03/04/2018   Procedure: LOOP RECORDER REMOVAL;  Surgeon: Constance Haw, MD;  Location: [facility];  Service: Cardiovascular;  Laterality: N/A;  . MYRINGOTOMY WITH TUBE PLACEMENT    . MYRINGOTOMY WITH TUBE PLACEMENT Right 11/05/2017   Procedure: MYRINGOTOMY WITH TUBE  PLACEMENT;  Surgeon: Melissa Montane, MD;  Location: [facility];  Service: ENT;  Laterality: Right;  right T Tube placement  . NASAL SINUS SURGERY    . TEE WITHOUT CARDIOVERSION N/A 05/30/2017   Procedure: TRANSESOPHAGEAL ECHOCARDIOGRAM (TEE);  Surgeon: Acie Fredrickson Wonda Cheng, MD;  Location: [facility] ENDOSCOPY;  Service: Cardiovascular;  Laterality: N/A;    Family History:  Family History  Problem Relation Age of Onset  . Diabetes Mother        passed away from accidental death  . Hypertension Mother   . Bipolar disorder Father   . Diabetes Daughter   . Leukemia Daughter   . Ovarian cancer Maternal Aunt     Social History:  reports that she has never smoked. She  has never used smokeless tobacco. She reports that she does not drink alcohol or use drugs.  Additional Social History:  Alcohol / Drug Use Pain Medications: Pt unsure of medication list. Prescriptions: Pt not taking medication for the last week. Over the Counter: N/A History of alcohol / drug use?: No history of alcohol / drug abuse  CIWA: CIWA-Ar BP: (!) 150/100 Pulse Rate: 60 COWS:    Allergies:  Allergies  Allergen Reactions  . Amoxicillin Itching    Has patient had a PCN reaction causing immediate rash, facial/tongue/throat swelling, SOB or lightheadedness with hypotension: yes Has patient had a PCN reaction causing severe rash involving mucus membranes or skin necrosis: no Has patient had a PCN reaction that required hospitalization: no Has patient had a PCN reaction occurring within the last 10 years: yes If all of the above answers are "NO", then may proceed with Cephalosporin use.  Marland Kitchen Lisinopril Swelling    Angioedema   . Hydrocodone Other (See Comments)    Depressed     Home Medications:  (Not in a hospital admission)  OB/GYN Status:  No LMP recorded. Patient is perimenopausal.  General Assessment Data Location of Assessment: [facility] TTS Assessment: In system Is this a Tele or Face-to-Face Assessment?: Face-to-Face Is this an Initial Assessment or a Re-assessment for this encounter?: Initial Assessment Marital status: Single Is patient pregnant?: Yes Pregnancy Status: Yes (Comment: include estimated delivery date) Living Arrangements: Children(Pt stays with her daughter and friends.) Can pt return to current living arrangement?: Yes Admission Status: Involuntary Is patient capable of signing voluntary admission?: No Referral Source: Self/Family/Friend Insurance type: [facility]     Crisis Care Plan Living Arrangements: Children(Pt stays with her daughter and friends.) Name of Psychiatrist: None Name of Therapist: None  Education Status Is patient  currently in school?: No Is the patient employed, unemployed or receiving disability?: Receiving disability income  Risk to self with the past 6 months Suicidal Ideation: No Has patient been a risk to self within the past 6 months prior to admission? : No Suicidal Intent: No Has patient had any suicidal intent within the past 6 months prior to admission? : No Is patient at risk for suicide?: No Suicidal Plan?: No Has patient had any suicidal plan within the past 6 months prior to admission? : No Access to Means: No What has been your use of drugs/alcohol within the last 12 months?: None Previous Attempts/Gestures: No How many times?: 0 Other Self Harm Risks: None Triggers for Past Attempts: None known Intentional Self Injurious Behavior: None Family Suicide History: No Recent stressful life event(s): Conflict (Comment)(Arguments with daughters.) Persecutory voices/beliefs?: Yes Depression: No Depression Symptoms: (Pt denies depressive symptoms.) Substance abuse history and/or treatment for substance abuse?: No Suicide prevention  information given to non-admitted patients: Not applicable  Risk to Others within the past 6 months Homicidal Ideation: No Does patient have any lifetime risk of violence toward others beyond the six months prior to admission? : No Thoughts of Harm to Others: No Current Homicidal Intent: No Current Homicidal Plan: No Access to Homicidal Means: No Identified Victim: No one History of harm to others?: No Assessment of Violence: None Noted Violent Behavior Description: None reported. Does patient have access to weapons?: No Criminal Charges Pending?: No Does patient have a court date: No Is patient on probation?: No  Psychosis Hallucinations: None noted Delusions: None noted  Mental Status Report Appearance/Hygiene: Unremarkable, In scrubs Eye Contact: Good Motor Activity: Freedom of movement, Unremarkable Speech: Pressured Level of  Consciousness: Alert Mood: Anxious, Apprehensive, Helpless Affect: Anxious, Fearful Anxiety Level: Moderate Thought Processes: Coherent, Relevant Judgement: Impaired Orientation: Person, Place, Time, Situation Obsessive Compulsive Thoughts/Behaviors: Moderate  Cognitive Functioning Concentration: Decreased Memory: Recent Impaired, Remote Intact Is patient IDD: Yes Level of Function: Mild Is patient DD?: No I IQ score available?: No Insight: Poor Impulse Control: Fair Appetite: Poor Have you had any weight changes? : Loss Amount of the weight change? (lbs): (No appetite for last 2 days.) Sleep: Decreased Total Hours of Sleep: (<6H/D) Vegetative Symptoms: Decreased grooming  ADLScreening [facility] Assessment Services) Patient's cognitive ability adequate to safely complete daily activities?: Yes Patient able to express need for assistance with ADLs?: Yes Independently performs ADLs?: Yes (appropriate for developmental age)  Prior Inpatient Therapy Prior Inpatient Therapy: Yes Prior Therapy Dates: Over 5 years Prior Therapy Facilty/Provider(s): [facility] Reason for Treatment: depression  Prior Outpatient Therapy Prior Outpatient Therapy: Yes Prior Therapy Dates: This year but she is not sure Prior Therapy Facilty/Provider(s): [facility]? Reason for Treatment: outpatient? Does patient have an ACCT team?: No Does patient have Intensive In-House Services?  : No Does patient have [facility] services? : No Does patient have P4CC services?: No  ADL Screening (condition at time of admission) Patient's cognitive ability adequate to safely complete daily activities?: Yes Is the patient deaf or have difficulty hearing?: No Does the patient have difficulty seeing, even when wearing glasses/contacts?: No Does the patient have difficulty concentrating, remembering, or making decisions?: Yes Patient able to express need for assistance with ADLs?: Yes Does the patient have difficulty dressing or  bathing?: No Independently performs ADLs?: Yes (appropriate for developmental age) Does the patient have difficulty walking or climbing stairs?: No("Sometimes I get tired.") Weakness of Legs: None Weakness of Arms/Hands: None       Abuse/Neglect Assessment (Assessment to be complete while patient is alone) Abuse/Neglect Assessment Can Be Completed: Yes Physical Abuse: Denies Verbal Abuse: Denies Sexual Abuse: Denies Exploitation of patient/patient's resources: Denies Self-Neglect: Denies     Regulatory affairs officer (For Healthcare) Does Patient Have a Medical Advance Directive?: No Would patient like information on creating a medical advance directive?: No - Patient declined          Disposition:  Disposition Initial Assessment Completed for this Encounter: Yes Patient referred to: Other (Comment)(To be reviewed by PA)  On Site Evaluation by:   Reviewed with Physician:    Raymondo Band 04/14/2018 4:26 AM

## 2018-04-15 DIAGNOSIS — F29 Unspecified psychosis not due to a substance or known physiological condition: Secondary | ICD-10-CM | POA: Diagnosis not present

## 2018-04-15 DIAGNOSIS — R4587 Impulsiveness: Secondary | ICD-10-CM

## 2018-04-15 DIAGNOSIS — F419 Anxiety disorder, unspecified: Secondary | ICD-10-CM

## 2018-04-15 DIAGNOSIS — Z818 Family history of other mental and behavioral disorders: Secondary | ICD-10-CM | POA: Diagnosis not present

## 2018-04-15 DIAGNOSIS — Z736 Limitation of activities due to disability: Secondary | ICD-10-CM

## 2018-04-15 DIAGNOSIS — R450 Nervousness: Secondary | ICD-10-CM | POA: Diagnosis not present

## 2018-04-15 DIAGNOSIS — R443 Hallucinations, unspecified: Secondary | ICD-10-CM | POA: Diagnosis not present

## 2018-04-15 LAB — CBG MONITORING, ED
GLUCOSE-CAPILLARY: 152 mg/dL — AB (ref 65–99)
Glucose-Capillary: 99 mg/dL (ref 65–99)

## 2018-04-15 MED ORDER — RISPERIDONE 0.5 MG PO TABS: 0.5000 mg | ORAL_TABLET | Freq: Two times a day (BID) | ORAL | 0 refills | Status: DC

## 2018-04-15 NOTE — [facility] Suicide Risk Assessment (Signed)
Suicide Risk Assessment  Discharge Assessment   [facility] Discharge Suicide Risk Assessment   Principal Problem: Hallucinations, unspecified Discharge Diagnoses:  Patient Active Problem List   Diagnosis Date Noted  . Hallucinations, unspecified [R44.3] 04/15/2018  . Vaginal odor [N89.8] 02/10/2018  . Fungal skin infection [B36.9] 02/10/2018  . Malodorous urine [R82.90] 10/08/2017  . Lower urinary tract symptoms (LUTS) [R39.9] 10/08/2017  . Acute UTI [N39.0] 10/08/2017  . Constipation [K59.00] 10/08/2017  . History of recent stroke [Z86.73] 08/06/2017  . Angioedema [T78.3XXA] 06/03/2017  . OSA (obstructive sleep apnea) [G47.33]   . Cryptogenic stroke ([place]) [I63.9] 05/20/2017  . Controlled type 2 diabetes mellitus without complication, without long-term current use of insulin ([place]) [E11.9] 05/08/2017  . Tiredness [R53.83] 05/13/2014  . Essential hypertension [I10] 07/27/2008  . Convulsions ([place]) [R56.9] 02/05/2007    Total Time spent with patient: 45 minutes  Musculoskeletal: Strength & Muscle Tone: within normal limits Gait & Station: normal Patient leans: N/A   Psychiatric Specialty Exam: Physical Exam  Constitutional: She is oriented to person, place, and time. She appears well-developed and well-nourished.  HENT:  Head: Normocephalic.  Respiratory: Effort normal.  Musculoskeletal: Normal range of motion.  Neurological: She is alert and oriented to person, place, and time.  Psychiatric: Thought content normal. Her mood appears anxious. Her speech is rapid and/or pressured. She is agitated. Cognition and memory are normal. She expresses impulsivity.   Review of Systems  Psychiatric/Behavioral: Positive for depression. Negative for hallucinations, memory loss, substance abuse and suicidal ideas. The patient is nervous/anxious. The patient does not have insomnia.   All other systems reviewed and are negative.  Blood pressure 113/77, pulse 86, temperature 98.4 F (36.9 C),  temperature source Oral, resp. rate 18, SpO2 100 %.There is no height or weight on file to calculate BMI. General Appearance: Casual Eye Contact:  Good Speech:  Clear and Coherent Volume:  Normal Mood:  Anxious and Depressed Affect:  Congruent and Depressed Thought Process:  Coherent, Goal Directed and Linear Orientation:  Full (Time, Place, and Person) Thought Content:  Logical Suicidal Thoughts:  No Homicidal Thoughts:  No Memory:  Immediate;   Good Recent;   Good Remote;   Fair Judgement:  Fair Insight:  Fair Psychomotor Activity:  Normal Concentration:  Concentration: Good and Attention Span: Good Recall:  Good Fund of Knowledge:  Good Language:  Good Akathisia:  No Handed:  Right AIMS (if indicated):    Assets:  Agricultural consultant Housing Social Support ADL's:  Intact Cognition:  WNL   Mental Status Per Nursing Assessment::   On Admission:   Auditory hallucinations  Demographic Factors:  Low socioeconomic status and Unemployed  Loss Factors: Financial problems/change in socioeconomic status  Historical Factors: Impulsivity  Risk Reduction Factors:   Sense of responsibility to family and Living with another person, especially a relative  Continued Clinical Symptoms:  Severe Anxiety and/or Agitation Depression:   Impulsivity Alcohol/Substance Abuse/Dependencies  Cognitive Features That Contribute To Risk:  Closed-mindedness    Suicide Risk:  Minimal: No identifiable suicidal ideation.  Patients presenting with no risk factors but with morbid ruminations; may be classified as minimal risk based on the severity of the depressive symptoms    Plan Of Care/Follow-up recommendations:  Activity:  as tolerated Diet:  Heart Healthy  Ethelene Hal, NP 04/15/2018, 2:19 PM

## 2018-04-15 NOTE — [facility] Counselor (Signed)
Spoke with pt's dtr, Raford Pitcher [phone]) to advise of pt's pending discharge. Addition of Risperdal was the only med change reported to dtr when she asked.

## 2018-04-15 NOTE — Discharge Instructions (Signed)
For your behavioral health needs, you are advised to continue treatment with your current outpatient providers.  As an alternative for behavioral health services, contact [facility].  New patients are seen at their walk-in clinic.  Walk-in hours are Monday - Friday from 8:00 am - 12:00 pm, and from 1:00 pm - 3:00 pm.  Walk-in patients are seen on a first come, first served basis, so try to arrive as early as possible for the best chance of being seen the same day.  There is an initial fee of $22.50:       [facility] [place], [place] [address]      [phone]

## 2018-04-15 NOTE — Consult Note (Addendum)
[place] [facility] Consult   Reason for Consult:  Psychosis Referring Physician:  EDP Patient Identification: Brandi Gomez MRN:  [id number] Principal Diagnosis: Hallucinations, unspecified Diagnosis:   Patient Active Problem List   Diagnosis Date Noted  . Hallucinations, unspecified [R44.3] 04/15/2018  . Vaginal odor [N89.8] 02/10/2018  . Fungal skin infection [B36.9] 02/10/2018  . Malodorous urine [R82.90] 10/08/2017  . Lower urinary tract symptoms (LUTS) [R39.9] 10/08/2017  . Acute UTI [N39.0] 10/08/2017  . Constipation [K59.00] 10/08/2017  . History of recent stroke [Z86.73] 08/06/2017  . Angioedema [T78.3XXA] 06/03/2017  . OSA (obstructive sleep apnea) [G47.33]   . Cryptogenic stroke ([place]) [I63.9] 05/20/2017  . Controlled type 2 diabetes mellitus without complication, without long-term current use of insulin ([place]) [E11.9] 05/08/2017  . Tiredness [R53.83] 05/13/2014  . Essential hypertension [I10] 07/27/2008  . Convulsions ([place]) [R56.9] 02/05/2007    Total Time spent with patient: 45 minutes  Subjective:   Brandi Gomez is a 47 y.o. female patient admitted with psychosis.  HPI:  Pt was seen and chart reviewed with treatment team and Dr Mariea Clonts.  Pt denies suicidal/homicidal ideation, denies auditory/visual hallucinations and does not appear to be responding to internal stimuli. Pt stated she does not like it when her family cusses and acts out and she just wants to listen to her gospel music and read her book. Pt stated she stopped taking her medications and that is her business. Pt has a seizure disorder and according to family she had had some seizures at home. Pt has had no seizures at the [facility] and has been medication compliant while here. Pt receives her mental health medications from her PCP and is followed by neurology for her seizure disorder. Pt's UDS and BAL are negative. Pt is stable and psychiatrically clear for discharge.   Past Psychiatric History:  As above  Risk to Self: None Risk to Others: None Prior Inpatient Therapy: Prior Inpatient Therapy: Yes Prior Therapy Dates: Over 5 years Prior Therapy Facilty/Provider(s): [facility] Reason for Treatment: depression Prior Outpatient Therapy: Prior Outpatient Therapy: Yes Prior Therapy Dates: This year but she is not sure Prior Therapy Facilty/Provider(s): [facility]? Reason for Treatment: outpatient? Does patient have an ACCT team?: No Does patient have Intensive In-House Services?  : No Does patient have [facility] services? : No Does patient have P4CC services?: No  Past Medical History:  Past Medical History:  Diagnosis Date  . Anemia   . Anxiety   . Bipolar 1 disorder ([place])   . Common migraine 05/19/2015  . Depression   . Diabetes mellitus, type II ([place])   . Hypertension   . Irritable bowel syndrome (IBS)   . Mild mental retardation   . Obesity   . Partial complex seizure disorder with intractable epilepsy ([place]) 05/12/2014  . Seizures ([place])    intractable, sz 08/23/17  . Sleep apnea   . Stroke ([place])   . Type II or unspecified type diabetes mellitus without mention of complication, not stated as uncontrolled     Past Surgical History:  Procedure Laterality Date  . COLONOSCOPY     2012-normal , Dr Sharlett Iles  . ESOPHAGOGASTRODUODENOSCOPY     normal-Dr Patterson 2012  . LOOP RECORDER INSERTION N/A 05/30/2017   Procedure: Loop Recorder Insertion;  Surgeon: Constance Haw, MD;  Location: [place] [facility];  Service: Cardiovascular;  Laterality: N/A;  . LOOP RECORDER REMOVAL N/A 03/04/2018   Procedure: LOOP RECORDER REMOVAL;  Surgeon: Constance Haw, MD;  Location: [place] [facility];  Service: Cardiovascular;  Laterality: N/A;  . MYRINGOTOMY WITH TUBE PLACEMENT    . MYRINGOTOMY WITH TUBE PLACEMENT Right 11/05/2017   Procedure: MYRINGOTOMY WITH TUBE PLACEMENT;  Surgeon: Melissa Montane, MD;  Location: [place];  Service: ENT;  Laterality: Right;  right T Tube placement  . NASAL SINUS  SURGERY    . TEE WITHOUT CARDIOVERSION N/A 05/30/2017   Procedure: TRANSESOPHAGEAL ECHOCARDIOGRAM (TEE);  Surgeon: Acie Fredrickson Wonda Cheng, MD;  Location: [facility] ENDOSCOPY;  Service: Cardiovascular;  Laterality: N/A;   Family History:  Family History  Problem Relation Age of Onset  . Diabetes Mother        passed away from accidental death  . Hypertension Mother   . Bipolar disorder Father   . Diabetes Daughter   . Leukemia Daughter   . Ovarian cancer Maternal Aunt    Family Psychiatric  History: Father-bipolar disorder Social History:  Social History   Substance and Sexual Activity  Alcohol Use No     Social History   Substance and Sexual Activity  Drug Use No    Social History   Socioeconomic History  . Marital status: Single    Spouse name: Brandi Gomez  . Number of children: 3  . Years of education: 10  . Highest education level: Not on file  Occupational History  . Occupation: disabled    Employer: DISABLED  Social Needs  . Financial resource strain: Not on file  . Food insecurity:    Worry: Not on file    Inability: Not on file  . Transportation needs:    Medical: Not on file    Non-medical: Not on file  Tobacco Use  . Smoking status: Never Smoker  . Smokeless tobacco: Never Used  Substance and Sexual Activity  . Alcohol use: No  . Drug use: No  . Sexual activity: Never  Lifestyle  . Physical activity:    Days per week: Not on file    Minutes per session: Not on file  . Stress: Not on file  Relationships  . Social connections:    Talks on phone: Not on file    Gets together: Not on file    Attends religious service: Not on file    Active member of club or organization: Not on file    Attends meetings of clubs or organizations: Not on file    Relationship status: Not on file  Other Topics Concern  . Not on file  Social History Narrative   Patient lives at home with daughter.    Patient has 3 children.    Patient is right handed.    Patient has a high school  education.    Patient is on disability   Patient drinks 2 cups of caffeine daily.   Additional Social History: She lives at home with her daughter.     Allergies:   Allergies  Allergen Reactions  . Amoxicillin Itching    Has patient had a PCN reaction causing immediate rash, facial/tongue/throat swelling, SOB or lightheadedness with hypotension: yes Has patient had a PCN reaction causing severe rash involving mucus membranes or skin necrosis: no Has patient had a PCN reaction that required hospitalization: no Has patient had a PCN reaction occurring within the last 10 years: yes If all of the above answers are "NO", then may proceed with Cephalosporin use.  Marland Kitchen Lisinopril Swelling    Angioedema   . Hydrocodone Other (See Comments)    Depressed     Labs:  Results for orders  placed or performed during the hospital encounter of 04/13/18 (from the past 48 hour(s))  Comprehensive metabolic panel     Status: Abnormal   Collection Time: 04/13/18 10:47 PM  Result Value Ref Range   Sodium 141 135 - 145 mmol/L   Potassium 3.5 3.5 - 5.1 mmol/L   Chloride 104 101 - 111 mmol/L   CO2 26 22 - 32 mmol/L   Glucose, Bld 111 (H) 65 - 99 mg/dL   BUN 10 6 - 20 mg/dL   Creatinine, Ser 0.95 0.44 - 1.00 mg/dL   Calcium 8.7 (L) 8.9 - 10.3 mg/dL   Total Protein 7.1 6.5 - 8.1 g/dL   Albumin 3.3 (L) 3.5 - 5.0 g/dL   AST 38 15 - 41 U/L   ALT 44 14 - 54 U/L   Alkaline Phosphatase 143 (H) 38 - 126 U/L   Total Bilirubin 0.8 0.3 - 1.2 mg/dL   GFR calc non Af Amer >60 >60 mL/min   GFR calc Af Amer >60 >60 mL/min    Comment: (NOTE) The eGFR has been calculated using the CKD EPI equation. This calculation has not been validated in all clinical situations. eGFR's persistently <60 mL/min signify possible Chronic Kidney Disease.    Anion gap 11 5 - 15    Comment: Performed at [facility], [place] [address], [place], [place] [address]  Ethanol     Status: None   Collection Time: 04/13/18  10:47 PM  Result Value Ref Range   Alcohol, Ethyl (B) <10 <10 mg/dL    Comment:        LOWEST DETECTABLE LIMIT FOR SERUM ALCOHOL IS 10 mg/dL FOR MEDICAL PURPOSES ONLY Performed at [place] [address], [place], [place] [address]   Salicylate level     Status: None   Collection Time: 04/13/18 10:47 PM  Result Value Ref Range   Salicylate Lvl <4.1 2.8 - 30.0 mg/dL    Comment: Performed at [facility], [place] [address] [place], [place] [address]  Acetaminophen level     Status: Abnormal   Collection Time: 04/13/18 10:47 PM  Result Value Ref Range   Acetaminophen (Tylenol), Serum <10 (L) 10 - 30 ug/mL    Comment:        THERAPEUTIC CONCENTRATIONS VARY SIGNIFICANTLY. A RANGE OF 10-30 ug/mL MAY BE AN EFFECTIVE CONCENTRATION FOR MANY PATIENTS. HOWEVER, SOME ARE BEST TREATED AT CONCENTRATIONS OUTSIDE THIS RANGE. ACETAMINOPHEN CONCENTRATIONS >150 ug/mL AT 4 HOURS AFTER INGESTION AND >50 ug/mL AT 12 HOURS AFTER INGESTION ARE OFTEN ASSOCIATED WITH TOXIC REACTIONS. Performed at [facility], [place] [address], [place], [place] [address]   cbc     Status: Abnormal   Collection Time: 04/13/18 10:47 PM  Result Value Ref Range   WBC 5.7 4.0 - 10.5 K/uL   RBC 3.68 (L) 3.87 - 5.11 MIL/uL   Hemoglobin 11.3 (L) 12.0 - 15.0 g/dL   HCT 34.1 (L) 36.0 - 46.0 %   MCV 92.7 78.0 - 100.0 fL   MCH 30.7 26.0 - 34.0 pg   MCHC 33.1 30.0 - 36.0 g/dL   RDW 12.7 11.5 - 15.5 %   Platelets 216 150 - 400 K/uL    Comment: Performed at [facility], [place] [address], [place],  [address]  CBG monitoring, ED     Status: Abnormal   Collection Time: 04/14/18  8:13 AM  Result Value Ref Range   Glucose-Capillary 148 (H) 65 - 99  mg/dL  CBG monitoring, ED     Status: None   Collection Time: 04/14/18  2:59 PM  Result Value Ref Range   Glucose-Capillary 70 65 - 99 mg/dL  CBG monitoring, ED     Status: Abnormal   Collection  Time: 04/14/18  3:51 PM  Result Value Ref Range   Glucose-Capillary 104 (H) 65 - 99 mg/dL   Comment 1 Notify RN   Rapid urine drug screen (hospital performed)     Status: None   Collection Time: 04/14/18  5:47 PM  Result Value Ref Range   Opiates NONE DETECTED NONE DETECTED   Cocaine NONE DETECTED NONE DETECTED   Benzodiazepines NONE DETECTED NONE DETECTED   Amphetamines NONE DETECTED NONE DETECTED   Tetrahydrocannabinol NONE DETECTED NONE DETECTED   Barbiturates NONE DETECTED NONE DETECTED    Comment: (NOTE) DRUG SCREEN FOR MEDICAL PURPOSES ONLY.  IF CONFIRMATION IS NEEDED FOR ANY PURPOSE, NOTIFY LAB WITHIN 5 DAYS. LOWEST DETECTABLE LIMITS FOR URINE DRUG SCREEN Drug Class                     Cutoff (ng/mL) Amphetamine and metabolites    1000 Barbiturate and metabolites    200 Benzodiazepine                 790 Tricyclics and metabolites     300 Opiates and metabolites        300 Cocaine and metabolites        300 THC                            50 Performed at [facility], [name] [address] [place], [place] [address]   CBG monitoring, ED     Status: Abnormal   Collection Time: 04/14/18  6:00 PM  Result Value Ref Range   Glucose-Capillary 137 (H) 65 - 99 mg/dL  CBG monitoring, ED     Status: Abnormal   Collection Time: 04/14/18  8:31 PM  Result Value Ref Range   Glucose-Capillary 130 (H) 65 - 99 mg/dL  CBG monitoring, ED     Status: None   Collection Time: 04/15/18  7:24 AM  Result Value Ref Range   Glucose-Capillary 99 65 - 99 mg/dL  CBG monitoring, ED     Status: Abnormal   Collection Time: 04/15/18 11:51 AM  Result Value Ref Range   Glucose-Capillary 152 (H) 65 - 99 mg/dL    Current Facility-Administered Medications  Medication Dose Route Frequency Provider Last Rate Last Dose  . acetaminophen (TYLENOL) tablet 650 mg  650 mg Oral Q4H PRN Pollina, Gwenyth Allegra, MD      . alum & mag hydroxide-simeth (MAALOX/MYLANTA) 200-200-20 MG/5ML suspension  30 mL  30 mL Oral Q6H PRN Pollina, Gwenyth Allegra, MD      . aspirin tablet 325 mg  325 mg Oral Daily Orpah Greek, MD   325 mg at 04/15/18 1036  . atorvastatin (LIPITOR) tablet 10 mg  10 mg Oral q1800 Orpah Greek, MD   10 mg at 04/14/18 1802  . carvedilol (COREG) tablet 3.125 mg  3.125 mg Oral BID Orpah Greek, MD   3.125 mg at 04/15/18 0856  . clotrimazole (LOTRIMIN) 1 % cream   Topical BID Pollina, Gwenyth Allegra, MD      . dicyclomine (BENTYL) capsule 10 mg  10 mg Oral TID AC Orpah Greek, MD   10 mg at 04/15/18  1252  . docusate sodium (COLACE) capsule 100 mg  100 mg Oral Q12H Pollina, Gwenyth Allegra, MD   100 mg at 04/15/18 1252  . DULoxetine (CYMBALTA) DR capsule 30 mg  30 mg Oral Daily Orpah Greek, MD   30 mg at 04/15/18 1036  . insulin aspart (novoLOG) injection 0-15 Units  0-15 Units Subcutaneous TID WC Orpah Greek, MD   3 Units at 04/15/18 1251  . levETIRAcetam (KEPPRA) tablet 500 mg  500 mg Oral BID Orpah Greek, MD   500 mg at 04/15/18 1036  . linagliptin (TRADJENTA) tablet 5 mg  5 mg Oral Daily Pollina, Gwenyth Allegra, MD   5 mg at 04/15/18 1036  . LORazepam (ATIVAN) tablet 1 mg  1 mg Oral PRN Orpah Greek, MD      . metFORMIN (GLUCOPHAGE) tablet 500 mg  500 mg Oral BID WC Orpah Greek, MD   500 mg at 04/15/18 0856  . ondansetron (ZOFRAN) tablet 4 mg  4 mg Oral Q8H PRN Pollina, Gwenyth Allegra, MD      . phenytoin (DILANTIN) ER capsule 200 mg  200 mg Oral Q breakfast Pollina, Gwenyth Allegra, MD   200 mg at 04/15/18 0856  . phenytoin (DILANTIN) ER capsule 300 mg  300 mg Oral QHS Orpah Greek, MD   300 mg at 04/14/18 2139  . risperiDONE (RISPERDAL) tablet 0.5 mg  0.5 mg Oral BID Faythe Dingwall, DO   0.5 mg at 04/15/18 1036  . torsemide (DEMADEX) tablet 20 mg  20 mg Oral Daily Pollina, Gwenyth Allegra, MD   20 mg at 04/15/18 1036  . zolpidem (AMBIEN) tablet 5 mg  5 mg Oral QHS PRN Orpah Greek, MD       Current Outpatient Medications  Medication Sig Dispense Refill  . ACCU-CHEK SOFTCLIX LANCETS lancets Use as instructed 100 each 12  . aspirin 325 MG tablet Take 1 tablet (325 mg total) by mouth daily. 30 tablet 1  . atorvastatin (LIPITOR) 10 MG tablet Take 1 tablet (10 mg total) by mouth daily at 6 PM. 90 tablet 3  . carvedilol (COREG) 3.125 MG tablet TAKE 1 TABLET BY MOUTH TWICE A DAY 60 tablet 2  . clotrimazole-betamethasone (LOTRISONE) cream Apply to affected area 2 times daily prn 45 g 1  . dicyclomine (BENTYL) 10 MG capsule Take 1 capsule (10 mg total) by mouth 3 (three) times daily before meals. 90 capsule 1  . docusate sodium (COLACE) 100 MG capsule Take 1 capsule (100 mg total) by mouth every 12 (twelve) hours. 60 capsule 0  . DULoxetine (CYMBALTA) 30 MG capsule Take 1 capsule (30 mg total) by mouth daily. 30 capsule 3  . glucose blood test strip Use as instructed 100 each 12  . ibuprofen (ADVIL,MOTRIN) 200 MG tablet Take 400 mg by mouth every 6 (six) hours as needed for headache or mild pain.    Marland Kitchen levETIRAcetam (KEPPRA) 500 MG tablet Take 1 tablet (500 mg total) by mouth 2 (two) times daily. 180 tablet 1  . metFORMIN (GLUCOPHAGE) 500 MG tablet Take 1 tablet (500 mg total) by mouth 2 (two) times daily with a meal. 180 tablet 3  . nystatin (MYCOSTATIN/NYSTOP) powder Apply topically 2 (two) times daily. 45 g 0  . phenytoin (DILANTIN) 100 MG ER capsule TAKE 2 CAPSULES IN THE MORNING AND 3 CAPSULES IN THE EVENING 450 capsule 1  . sitaGLIPtin (JANUVIA) 50 MG tablet Take 1 tablet (50 mg total) by mouth  daily. For control of blood sugar 90 tablet 1  . torsemide (DEMADEX) 20 MG tablet Take 1 tablet (20 mg total) by mouth daily. 90 tablet 1  . risperiDONE (RISPERDAL) 0.5 MG tablet Take 1 tablet (0.5 mg total) by mouth 2 (two) times daily. 30 tablet 0    Musculoskeletal: Strength & Muscle Tone: within normal limits Gait & Station: normal Patient leans:  N/A  Psychiatric Specialty Exam: Physical Exam  Nursing note and vitals reviewed. Constitutional: She is oriented to person, place, and time. She appears well-developed and well-nourished.  HENT:  Head: Normocephalic and atraumatic.  Neck: Normal range of motion.  Respiratory: Effort normal.  Musculoskeletal: Normal range of motion.  Neurological: She is alert and oriented to person, place, and time.  Psychiatric: Her speech is normal and behavior is normal. Thought content normal. Her mood appears anxious. Cognition and memory are normal. She expresses impulsivity.    Review of Systems  Psychiatric/Behavioral: Positive for depression. Negative for hallucinations, memory loss, substance abuse and suicidal ideas. The patient is nervous/anxious. The patient does not have insomnia.   All other systems reviewed and are negative.   Blood pressure 113/77, pulse 86, temperature 98.4 F (36.9 C), temperature source Oral, resp. rate 18, SpO2 100 %.There is no height or weight on file to calculate BMI.  General Appearance: Casual  Eye Contact:  Good  Speech:  Clear and Coherent  Volume:  Normal  Mood:  Anxious and Depressed  Affect:  Congruent and Depressed  Thought Process:  Coherent, Goal Directed and Linear  Orientation:  Full (Time, Place, and Person)  Thought Content:  Logical  Suicidal Thoughts:  No  Homicidal Thoughts:  No  Memory:  Immediate;   Good Recent;   Good Remote;   Fair  Judgement:  Fair  Insight:  Fair  Psychomotor Activity:  Normal  Concentration:  Concentration: Good and Attention Span: Good  Recall:  Good  Fund of Knowledge:  Good  Language:  Good  Akathisia:  No  Handed:  Right  AIMS (if indicated):   N/A  Assets:  Agricultural consultant Housing Social Support  ADL's:  Intact  Cognition:  WNL  Sleep:   N/A     Treatment Plan Summary: Plan Hallucinations, unspecified  Discharge Home Take all medications as prescribed.  Started Risperdal 0.5 mg BID for psychosis.  Follow up with your outpatient providers  Disposition: No evidence of imminent risk to self or others at present.   Patient does not meet criteria for psychiatric inpatient admission. Supportive therapy provided about ongoing stressors. Discussed crisis plan, support from social network, calling 911, coming to the Emergency Department, and calling Suicide Hotline.  Ethelene Hal, NP 04/15/2018 2:05 PM   Patient seen face-to-face for psychiatric evaluation, chart reviewed and case discussed with the physician extender and developed treatment plan. Reviewed the information documented and agree with the treatment plan.  Buford Dresser, DO 04/15/18 5:16 PM

## 2018-04-15 NOTE — BH Assessment (Signed)
[facility] Assessment Progress Note  Per Buford Dresser, DO, this pt does not require psychiatric hospitalization at this time.  Pt presents under IVC initiated by EDP Joseph Berkshire, MD, which Dr Mariea Clonts has rescinded.  Pt reportedly receives outpatient behavioral health services from her PCP and her outpatient neurologist.  Discharge instructions advise her to follow up with them, offering referral information for [facility] as an alternative for specialty behavioral health services.  Pt's nurse, Diane, has been notified.  Jalene Mullet, [place] Triage Specialist [phone]

## 2018-04-15 NOTE — ED Notes (Signed)
Pt discharged home. Discharged instructions read to pt who verbalized understanding. All belongings returned to pt who signed for same. Denies SI/HI, is not delusional and not responding to internal stimuli. Escorted pt to the ED exit.    

## 2018-04-23 ENCOUNTER — Encounter (HOSPITAL_COMMUNITY): Payer: Self-pay | Admitting: Emergency Medicine

## 2018-04-23 ENCOUNTER — Emergency Department (HOSPITAL_COMMUNITY)
Admission: EM | Admit: 2018-04-23 | Discharge: 2018-04-23 | Disposition: A | Discharge status: Home / Self Care | Payer: Medicare HMO | Attending: Emergency Medicine | Admitting: Emergency Medicine

## 2018-04-23 ENCOUNTER — Other Ambulatory Visit: Payer: Self-pay

## 2018-04-23 DIAGNOSIS — Z8673 Personal history of transient ischemic attack (TIA), and cerebral infarction without residual deficits: Secondary | ICD-10-CM | POA: Insufficient documentation

## 2018-04-23 DIAGNOSIS — Z7982 Long term (current) use of aspirin: Secondary | ICD-10-CM | POA: Insufficient documentation

## 2018-04-23 DIAGNOSIS — E119 Type 2 diabetes mellitus without complications: Secondary | ICD-10-CM | POA: Insufficient documentation

## 2018-04-23 DIAGNOSIS — Z7984 Long term (current) use of oral hypoglycemic drugs: Secondary | ICD-10-CM | POA: Diagnosis not present

## 2018-04-23 DIAGNOSIS — I10 Essential (primary) hypertension: Secondary | ICD-10-CM | POA: Diagnosis not present

## 2018-04-23 DIAGNOSIS — R569 Unspecified convulsions: Secondary | ICD-10-CM | POA: Diagnosis not present

## 2018-04-23 DIAGNOSIS — G40909 Epilepsy, unspecified, not intractable, without status epilepticus: Secondary | ICD-10-CM | POA: Diagnosis not present

## 2018-04-23 DIAGNOSIS — Z79899 Other long term (current) drug therapy: Secondary | ICD-10-CM | POA: Insufficient documentation

## 2018-04-23 LAB — CBC WITH DIFFERENTIAL/PLATELET
BASOS PCT: 0 %
Basophils Absolute: 0 10*3/uL (ref 0.0–0.1)
Eosinophils Absolute: 0 10*3/uL (ref 0.0–0.7)
Eosinophils Relative: 1 %
HEMATOCRIT: 34.4 % — AB (ref 36.0–46.0)
HEMOGLOBIN: 11.4 g/dL — AB (ref 12.0–15.0)
Lymphocytes Relative: 24 %
Lymphs Abs: 1.5 10*3/uL (ref 0.7–4.0)
MCH: 30.6 pg (ref 26.0–34.0)
MCHC: 33.1 g/dL (ref 30.0–36.0)
MCV: 92.2 fL (ref 78.0–100.0)
Monocytes Absolute: 0.4 10*3/uL (ref 0.1–1.0)
Monocytes Relative: 6 %
NEUTROS ABS: 4.4 10*3/uL (ref 1.7–7.7)
NEUTROS PCT: 69 %
Platelets: 248 10*3/uL (ref 150–400)
RBC: 3.73 MIL/uL — AB (ref 3.87–5.11)
RDW: 12.5 % (ref 11.5–15.5)
WBC: 6.4 10*3/uL (ref 4.0–10.5)

## 2018-04-23 LAB — BASIC METABOLIC PANEL
Anion gap: 11 (ref 5–15)
BUN: 11 mg/dL (ref 6–20)
CO2: 27 mmol/L (ref 22–32)
Calcium: 9 mg/dL (ref 8.9–10.3)
Chloride: 102 mmol/L (ref 101–111)
Creatinine, Ser: 0.94 mg/dL (ref 0.44–1.00)
GFR calc Af Amer: >60 mL/min (ref >60)
GLUCOSE: 146 mg/dL — AB (ref 65–99)
POTASSIUM: 3.5 mmol/L (ref 3.5–5.1)
SODIUM: 140 mmol/L (ref 135–145)

## 2018-04-23 MED ORDER — PHENYTOIN SODIUM EXTENDED 100 MG PO CAPS
200.0000 mg | ORAL_CAPSULE | Freq: Once | ORAL | Status: AC
  Administered 2018-04-23: 200 mg via ORAL
  Filled 2018-04-23: qty 2

## 2018-04-23 MED ORDER — SODIUM CHLORIDE 0.9 % IV BOLUS
1000.0000 mL | Freq: Once | INTRAVENOUS | Status: AC
  Administered 2018-04-23: 1000 mL via INTRAVENOUS

## 2018-04-23 MED ORDER — LEVETIRACETAM IN NACL 1000 MG/100ML IV SOLN
1000.0000 mg | Freq: Once | INTRAVENOUS | Status: AC
  Administered 2018-04-23: 1000 mg via INTRAVENOUS
  Filled 2018-04-23: qty 100

## 2018-04-23 NOTE — ED Notes (Signed)
Pt prefers to keep personal clothing on.

## 2018-04-23 NOTE — ED Notes (Signed)
Verbalized understanding discharge instructions and follow-up.  Pt refused d/c vitals. In no acute distress.  Pt will wait in lobby for ride.  Sts her friend is coming to get her.

## 2018-04-23 NOTE — ED Triage Notes (Signed)
Patient BIB [facility] from home. Family stated pt had a 63min long seizure about 1 hour ago. Pt now c/o back pain. No known injury, seizure happened while pt was lying on the couch. Last known seizure was earlier this week. Pt recently stopped taking her seizure medication.

## 2018-04-23 NOTE — ED Provider Notes (Signed)
[facility] DEPT Provider Note  CSN: [id number] Arrival date & time: 04/23/18 0706  Chief Complaint(s) Seizures  HPI Brandi Gomez is a 47 y.o. female with extensive past medical history listed below including bipolar disorder, mild MR, diabetes, seizures on Keppra and Dilantin who presents to the emergency department for episode of seizure that lasted approximately 12 minutes reported to EMS by family.  Seizure occurred 1 hour prior to arrival.  No associated trauma.  Patient was back to her baseline upon EMS arrival.  Patient reports that she is been off of her medication for approximately 1 to 2 weeks.  She states that she believed that  God and Jesus will help her to feel better and not need the medication.  She states that she now knows that that was a mistake and needs to be back on her medication.  Patient denies any recent fevers or infections.  No recent emesis or diarrhea.  No chest pain or shortness of breath.  No abdominal pain.  No urinary symptoms.  Of note, patient was seen here 8 days ago for medication noncompliance.  At that time she was IVC eat and evaluated by behavioral health who cleared the patient for outpatient management.   Currently the patient only complains of left mid back pain.  She states that she noted the pain after her seizure.  States that she slept on her recliner and believes it was related to that.   Patient reports that she is been off of all of her medication including her seizure medications for the past 1 to 2 weeks.  Attempted to call emergency contact: [name] at [phone], but there was no answer and voice mail was not activated.  HPI  Past Medical History Past Medical History:  Diagnosis Date  . Anemia   . Anxiety   . Bipolar 1 disorder ([place])   . Common migraine 05/19/2015  . Depression   . Diabetes mellitus, type II ([place])   . Hypertension   . Irritable bowel syndrome (IBS)   . Mild mental  retardation   . Obesity   . Partial complex seizure disorder with intractable epilepsy ([place]) 05/12/2014  . Seizures ([place])    intractable, sz 08/23/17  . Sleep apnea   . Stroke ([place])   . Type II or unspecified type diabetes mellitus without mention of complication, not stated as uncontrolled    Patient Active Problem List   Diagnosis Date Noted  . Hallucinations, unspecified 04/15/2018  . Vaginal odor 02/10/2018  . Fungal skin infection 02/10/2018  . Malodorous urine 10/08/2017  . Lower urinary tract symptoms (LUTS) 10/08/2017  . Acute UTI 10/08/2017  . Constipation 10/08/2017  . History of recent stroke 08/06/2017  . Angioedema 06/03/2017  . OSA (obstructive sleep apnea)   . Cryptogenic stroke ([place]) 05/20/2017  . Controlled type 2 diabetes mellitus without complication, without long-term current use of insulin ([place]) 05/08/2017  . Tiredness 05/13/2014  . Essential hypertension 07/27/2008  . Convulsions ([place]) 02/05/2007   Home Medication(s) Prior to Admission medications   Medication Sig Start Date End Date Taking? Authorizing Provider  aspirin EC 81 MG tablet Take 81 mg by mouth daily.   Yes [provider]  atorvastatin (LIPITOR) 10 MG tablet Take 1 tablet (10 mg total) by mouth daily at 6 PM. 12/23/17 04/23/18 Yes Sagardia, Ines Bloomer, MD  carvedilol (COREG) 3.125 MG tablet TAKE 1 TABLET BY MOUTH TWICE A DAY 03/05/18  Yes Sagardia, Ines Bloomer, MD  clotrimazole-betamethasone (  LOTRISONE) cream Apply to affected area 2 times daily prn Patient taking differently: Apply 1 application topically 2 (two) times daily as needed (rash).  03/14/18  Yes Blue, Olivia C, PA-C  levETIRAcetam (KEPPRA) 500 MG tablet Take 1 tablet (500 mg total) by mouth 2 (two) times daily. 11/03/17  Yes Ward Givens, NP  metFORMIN (GLUCOPHAGE) 500 MG tablet Take 1 tablet (500 mg total) by mouth 2 (two) times daily with a meal. 02/10/18  Yes Sagardia, Ines Bloomer, MD  nystatin (MYCOSTATIN/NYSTOP) powder Apply  topically 2 (two) times daily. Patient taking differently: Apply 1 g topically 2 (two) times daily as needed (rash).  03/14/18  Yes Blue, Olivia C, PA-C  phenytoin (DILANTIN) 100 MG ER capsule TAKE 2 CAPSULES IN THE MORNING AND 3 CAPSULES IN THE EVENING Patient taking differently: Take 200-300 mg by mouth See admin instructions. TAKE 3 CAPSULES IN THE MORNING AND 2 CAPSULES IN THE EVENING 11/28/17  Yes [name], MD  risperiDONE (RISPERDAL) 0.5 MG tablet Take 1 tablet (0.5 mg total) by mouth 2 (two) times daily. 04/15/18  Yes Ethelene Hal, NP  sitaGLIPtin (JANUVIA) 50 MG tablet Take 1 tablet (50 mg total) by mouth daily. For control of blood sugar 05/08/17  Yes Sagardia, Ines Bloomer, MD  torsemide (DEMADEX) 20 MG tablet Take 1 tablet (20 mg total) by mouth daily. 05/08/17  Yes Sagardia, Ines Bloomer, MD  ACCU-CHEK [facility] LANCETS lancets Use as instructed 06/06/17   Horald Pollen, MD  aspirin 325 MG tablet Take 1 tablet (325 mg total) by mouth daily. Patient not taking: Reported on 04/23/2018 05/23/17   Barton Dubois, MD  dicyclomine (BENTYL) 10 MG capsule Take 1 capsule (10 mg total) by mouth 3 (three) times daily before meals. Patient not taking: Reported on 04/23/2018 11/04/17   Doran Stabler, MD  docusate sodium (COLACE) 100 MG capsule Take 1 capsule (100 mg total) by mouth every 12 (twelve) hours. Patient not taking: Reported on 04/23/2018 12/02/17   Delia Heady, PA-C  DULoxetine (CYMBALTA) 30 MG capsule Take 1 capsule (30 mg total) by mouth daily. 08/06/17 04/13/18  Horald Pollen, MD  glucose blood test strip Use as instructed 06/06/17   Horald Pollen, MD  ibuprofen (ADVIL,MOTRIN) 200 MG tablet Take 400 mg by mouth every 6 (six) hours as needed for headache or mild pain.    [provider]                                                                                                                                    Past Surgical History Past  Surgical History:  Procedure Laterality Date  . COLONOSCOPY     2012-normal , Dr Sharlett Iles  . ESOPHAGOGASTRODUODENOSCOPY     normal-Dr Patterson 2012  . LOOP RECORDER INSERTION N/A 05/30/2017   Procedure: Loop Recorder Insertion;  Surgeon: Constance Haw, MD;  Location: [facility];  Service: Cardiovascular;  Laterality: N/A;  . LOOP RECORDER REMOVAL N/A 03/04/2018   Procedure: LOOP RECORDER REMOVAL;  Surgeon: Constance Haw, MD;  Location: [facility];  Service: Cardiovascular;  Laterality: N/A;  . MYRINGOTOMY WITH TUBE PLACEMENT    . MYRINGOTOMY WITH TUBE PLACEMENT Right 11/05/2017   Procedure: MYRINGOTOMY WITH TUBE PLACEMENT;  Surgeon: Melissa Montane, MD;  Location: [facility];  Service: ENT;  Laterality: Right;  right T Tube placement  . NASAL SINUS SURGERY    . TEE WITHOUT CARDIOVERSION N/A 05/30/2017   Procedure: TRANSESOPHAGEAL ECHOCARDIOGRAM (TEE);  Surgeon: Acie Fredrickson Wonda Cheng, MD;  [facility];  Service: Cardiovascular;  Laterality: N/A;   Family History Family History  Problem Relation Age of Onset  . Diabetes Mother        passed away from accidental death  . Hypertension Mother   . Bipolar disorder Father   . Diabetes Daughter   . Leukemia Daughter   . Ovarian cancer Maternal Aunt     Social History Social History   Tobacco Use  . Smoking status: Never Smoker  . Smokeless tobacco: Never Used  Substance Use Topics  . Alcohol use: No  . Drug use: No   Allergies Amoxicillin; Lisinopril; and Hydrocodone  Review of Systems Review of Systems All other systems are reviewed and are negative for acute change except as noted in the HPI  Physical Exam Vital Signs  I have reviewed the triage vital signs BP 120/80   Pulse 68   Temp 98 F (36.7 C) (Oral)   Resp 16   Ht 5\' 1"  (1.549 m)   Wt 117.9 kg (260 lb)   SpO2 100%   BMI 49.13 kg/m   Physical Exam  Constitutional: She is oriented to person, place, and time. She appears  well-developed and well-nourished. No distress.  obese  HENT:  Head: Normocephalic and atraumatic.  Right Ear: External ear normal.  Left Ear: External ear normal.  Nose: Nose normal.  Eyes: Conjunctivae and EOM are normal. No scleral icterus.  Neck: Normal range of motion and phonation normal.  Cardiovascular: Normal rate and regular rhythm.  Pulmonary/Chest: Effort normal. No stridor. No respiratory distress.  Abdominal: She exhibits no distension.  Musculoskeletal: Normal range of motion. She exhibits no edema.  Neurological: She is alert and oriented to person, place, and time. She has normal strength. A cranial nerve deficit and sensory deficit is present.  Skin: She is not diaphoretic.  Psychiatric: She has a normal mood and affect. Her behavior is normal.  Vitals reviewed.   ED Results and Treatments Labs (all labs ordered are listed, but only abnormal results are displayed) Labs Reviewed  CBC WITH DIFFERENTIAL/PLATELET - Abnormal; Notable for the following components:      Result Value   RBC 3.73 (*)    Hemoglobin 11.4 (*)    HCT 34.4 (*)    All other components within normal limits  BASIC METABOLIC PANEL - Abnormal; Notable for the following components:   Glucose, Bld 146 (*)    All other components within normal limits  EKG  EKG Interpretation  Date/Time:    Ventricular Rate:    PR Interval:    QRS Duration:   QT Interval:    QTC Calculation:   R Axis:     Text Interpretation:        Radiology No results found. Pertinent labs & imaging results that were available during my care of the patient were reviewed by me and considered in my medical decision making (see chart for details).  Medications Ordered in ED Medications  levETIRAcetam (KEPPRA) IVPB 1000 mg/100 mL premix (0 mg Intravenous Stopped 04/23/18 1043)  phenytoin (DILANTIN) ER  capsule 200 mg (200 mg Oral Given 04/23/18 0921)  sodium chloride 0.9 % bolus 1,000 mL (0 mLs Intravenous Stopped 04/23/18 1043)                                                                                                                                    Procedures Procedures  (including critical care time)  Medical Decision Making / ED Course I have reviewed the nursing notes for this encounter and the patient's prior records (if available in EHR or on provided paperwork).    Seizure activity in the setting of noncompliance.  Patient does appear to be relations but does not appear to be psychotic. No SI, HI, or AVH. She is oriented x4 and endorses remorse for not taking her home medication.  Acknowledges she does need to be on it.  Will obtain screening labs including, significant hyperglycemia as she has been off of all her medications.  Her labs were grossly reassuring without significant electrolyte derangements.  Mild hyperglycemia without DKA or HHS.  Patient was loaded on Keppra and given her home dose of Dilantin.  The patient appears reasonably screened and/or stabilized for discharge and I doubt any other medical condition or other [facility] requiring further screening, evaluation, or treatment in the ED at this time prior to discharge.  The patient is safe for discharge with strict return precautions.   Final Clinical Impression(s) / ED Diagnoses Final diagnoses:  Seizure ([place])   Disposition: Discharge  Condition: Good  I have discussed the results, Dx and Tx plan with the patient who expressed understanding and agree(s) with the plan. Discharge instructions discussed at great length. The patient was given strict return precautions who verbalized understanding of the instructions. No further questions at time of discharge.    ED Discharge Orders    None       Follow Up: Horald Pollen, MD [place] [address] [phone]  Schedule an  appointment as soon as possible for a visit  As needed      This chart was dictated using voice recognition software.  Despite best efforts to proofread,  errors can occur which can change the documentation meaning.   Fatima Blank, MD 04/23/18 1240

## 2018-04-23 NOTE — ED Notes (Signed)
Bed: MB34 Expected date:  Expected time:  Means of arrival:  Comments: 47 yo F  Seizure

## 2018-04-24 ENCOUNTER — Other Ambulatory Visit: Payer: Self-pay | Admitting: *Deleted

## 2018-04-24 NOTE — Patient Outreach (Signed)
[facility]) Care Management  04/24/2018  KENIYAH GELINAS 09-Jun-1971 [id number]   Telephone Screen  Referral Date:  04/24/2018 Referral Source:  [facility] ED Census Reason for Referral:  6 or more ED visits in the past 6 months Insurance:  [employer]   Outreach Attempt:  Outreach attempt #1 to patient for telephone screening. No answer. RN Health Coach left HIPAA compliant voicemail message along with contact information.  Plan:  RN Health Coach will send unsuccessful outreach letter to patient.  RN Health Coach will make another outreach attempt to patient within 3-4 business days if no return call back from patient.   [place] [phone] [email or web address]

## 2018-04-27 ENCOUNTER — Other Ambulatory Visit: Payer: Self-pay | Admitting: *Deleted

## 2018-04-27 NOTE — Patient Outreach (Signed)
[facility]) Care Management  04/27/2018  Brandi Gomez 03-10-1971 [id number]   Telephone Screen  Referral Date:  04/24/2018 Referral Source:  [facility] ED Census Reason for Referral:  6 or more ED visits in the past 6 months Insurance:  [employer]   Outreach Attempt:  Outreach attempt #2 to patient for telephone screening.  Gentleman answered home number listed and stated this was his number and patient was not with him.  Asked if he had number for patient and he stated no.  RN Health Coach outreached to mobile number of file.  No answer.  HIPAA appropriate voice message left.  Plan:   RN Health Coach will make another outreach attempt to patient within 3-4 business days if no return call back from patient.  [place] [phone] [email or web address]

## 2018-04-30 ENCOUNTER — Other Ambulatory Visit: Payer: Self-pay | Admitting: *Deleted

## 2018-04-30 NOTE — Patient Outreach (Signed)
[facility]) Care Management  04/30/2018  Brandi Gomez 1971/03/30 [id number]   Telephone Screen  Referral Date:04/24/2018 Referral Source:[facility] ED Census Reason for Referral:6 or more ED visits in the past 6 months Insurance:[employer]   Outreach Attempt:  Outreach attempt #3 to patient for telephone screenng.  Patient answered.  Would not confirm HIPAA.  [facility] services reviewed and described to patient.  Patient still reluctant to verify HIPAA.  Encouraged patient to discuss [facility] services with primary care provider.  Requested confirmation of address to mail out Successful Letter with [facility] brochure, and patient stated she did not like to give out her address over the phone and did not want information mailed.  Stated she would discuss [facility] services with her primary care provider.  Patient will not confirm HIPAA information and declines services at this time.  Plan:  RN Health Coach will close case based on patient declining [facility] services at this time.  [place] [phone] [email or web address]

## 2018-05-06 ENCOUNTER — Telehealth: Payer: Self-pay | Admitting: Neurology

## 2018-05-06 DIAGNOSIS — F3164 Bipolar disorder, current episode mixed, severe, with psychotic features: Secondary | ICD-10-CM

## 2018-05-06 NOTE — Telephone Encounter (Signed)
I called Mr. Camille Bal, the patient has had issues with not taking her medication, she believes that God will heal her and she does not need to be on her medication.  She has been in the hospital within the last couple weeks with seizures.  She was admitted briefly to the hospital around 13 Apr 2018 with psychosis.  She was sent back home, she has no psychiatric follow-up.  Her psychiatric medications are coming through her primary care physician.  I have recommended taking her through [facility] long hospital to get another psychiatric evaluation.  The patient will need some sort of follow-up following discharge.  The patient has not taking her seizure medications in 4 or 5 days.  Mr. Camille Bal asked me to talk with her daughter, Aneliese Beaudry, she apparently is her POA.  Her phone number is [phone].  I have called the number, no answer, I will call back later.   I called back again.  I talk with the daughter.  I suggested that they take her to was along for an evaluation.  I will try to make a referral to [facility] psychiatry to get somebody to follow her for her bipolar disorder.

## 2018-05-06 NOTE — Telephone Encounter (Signed)
Significant other [place] would like a call back from nurse/doc regarding patient not taking medications, talking to herself, hallucinations, etc. Best call back [phone].

## 2018-05-06 NOTE — Telephone Encounter (Signed)
Pt's daughter Raford Pitcher said she was returning Dr Jannifer Franklin call. She can be reached at [phone]

## 2018-05-10 ENCOUNTER — Other Ambulatory Visit: Payer: Self-pay | Admitting: Adult Health

## 2018-05-12 ENCOUNTER — Encounter: Payer: Self-pay | Admitting: Emergency Medicine

## 2018-05-12 ENCOUNTER — Ambulatory Visit (INDEPENDENT_AMBULATORY_CARE_PROVIDER_SITE_OTHER): Payer: Medicare HMO | Admitting: Emergency Medicine

## 2018-05-12 ENCOUNTER — Other Ambulatory Visit: Payer: Self-pay

## 2018-05-12 VITALS — BP 108/68 | HR 103 | Temp 99.2°F | Resp 16 | Ht 59.5 in | Wt 231.0 lb

## 2018-05-12 DIAGNOSIS — E119 Type 2 diabetes mellitus without complications: Secondary | ICD-10-CM | POA: Diagnosis not present

## 2018-05-12 DIAGNOSIS — I10 Essential (primary) hypertension: Secondary | ICD-10-CM

## 2018-05-12 DIAGNOSIS — Z8669 Personal history of other diseases of the nervous system and sense organs: Secondary | ICD-10-CM | POA: Diagnosis not present

## 2018-05-12 DIAGNOSIS — Z6841 Body Mass Index (BMI) 40.0 and over, adult: Secondary | ICD-10-CM | POA: Diagnosis not present

## 2018-05-12 LAB — POCT GLYCOSYLATED HEMOGLOBIN (HGB A1C): HEMOGLOBIN A1C: 6.3 % — AB (ref 4.0–5.6)

## 2018-05-12 LAB — GLUCOSE, POCT (MANUAL RESULT ENTRY): POC GLUCOSE: 120 mg/dL — AB (ref 70–99)

## 2018-05-12 NOTE — Assessment & Plan Note (Addendum)
Hemoglobin A1c and glucose levels continue to improve.  Continue present medication.  Continue diet and physical activity.  Follow-up in 3-6 months.

## 2018-05-12 NOTE — Patient Instructions (Addendum)
IF you received an x-ray today, you will receive an invoice from [facility]. Please contact [facility] at [phone] with questions or concerns regarding your invoice.   IF you received labwork today, you will receive an invoice from [facility]. Please contact [facility] at [phone] with questions or concerns regarding your invoice.   Our billing staff will not be able to assist you with questions regarding bills from these companies.  You will be contacted with the lab results as soon as they are available. The fastest way to get your results is to activate your My Chart account. Instructions are located on the last page of this paperwork. If you have not heard from [place] regarding the results in 2 weeks, please contact this office.     Diabetes Mellitus and Nutrition When you have diabetes (diabetes mellitus), it is very important to have healthy eating habits because your blood sugar (glucose) levels are greatly affected by what you eat and drink. Eating healthy foods in the appropriate amounts, at about the same times every day, can help you:  Control your blood glucose.  Lower your risk of heart disease.  Improve your blood pressure.  Reach or maintain a healthy weight.  Every person with diabetes is different, and each person has different needs for a meal plan. Your health care provider may recommend that you work with a diet and nutrition specialist (dietitian) to make a meal plan that is best for you. Your meal plan may vary depending on factors such as:  The calories you need.  The medicines you take.  Your weight.  Your blood glucose, blood pressure, and cholesterol levels.  Your activity level.  Other health conditions you have, such as heart or kidney disease.  How do carbohydrates affect me? Carbohydrates affect your blood glucose level more than any other type of food. Eating carbohydrates naturally increases the amount of glucose in your  blood. Carbohydrate counting is a method for keeping track of how many carbohydrates you eat. Counting carbohydrates is important to keep your blood glucose at a healthy level, especially if you use insulin or take certain oral diabetes medicines. It is important to know how many carbohydrates you can safely have in each meal. This is different for every person. Your dietitian can help you calculate how many carbohydrates you should have at each meal and for snack. Foods that contain carbohydrates include:  Bread, cereal, rice, pasta, and crackers.  Potatoes and corn.  Peas, beans, and lentils.  Milk and yogurt.  Fruit and juice.  Desserts, such as cakes, cookies, ice cream, and candy.  How does alcohol affect me? Alcohol can cause a sudden decrease in blood glucose (hypoglycemia), especially if you use insulin or take certain oral diabetes medicines. Hypoglycemia can be a life-threatening condition. Symptoms of hypoglycemia (sleepiness, dizziness, and confusion) are similar to symptoms of having too much alcohol. If your health care provider says that alcohol is safe for you, follow these guidelines:  Limit alcohol intake to no more than 1 drink per day for nonpregnant women and 2 drinks per day for men. One drink equals 12 oz of beer, 5 oz of wine, or 1 oz of hard liquor.  Do not drink on an empty stomach.  Keep yourself hydrated with water, diet soda, or unsweetened iced tea.  Keep in mind that regular soda, juice, and other mixers may contain a lot of sugar and must be counted as carbohydrates.  What are tips for following  this plan? Reading food labels  Start by checking the serving size on the label. The amount of calories, carbohydrates, fats, and other nutrients listed on the label are based on one serving of the food. Many foods contain more than one serving per package.  Check the total grams (g) of carbohydrates in one serving. You can calculate the number of servings of  carbohydrates in one serving by dividing the total carbohydrates by 15. For example, if a food has 30 g of total carbohydrates, it would be equal to 2 servings of carbohydrates.  Check the number of grams (g) of saturated and trans fats in one serving. Choose foods that have low or no amount of these fats.  Check the number of milligrams (mg) of sodium in one serving. Most people should limit total sodium intake to less than 2,300 mg per day.  Always check the nutrition information of foods labeled as "low-fat" or "nonfat". These foods may be higher in added sugar or refined carbohydrates and should be avoided.  Talk to your dietitian to identify your daily goals for nutrients listed on the label. Shopping  Avoid buying canned, premade, or processed foods. These foods tend to be high in fat, sodium, and added sugar.  Shop around the outside edge of the grocery store. This includes fresh fruits and vegetables, bulk grains, fresh meats, and fresh dairy. Cooking  Use low-heat cooking methods, such as baking, instead of high-heat cooking methods like deep frying.  Cook using healthy oils, such as olive, canola, or sunflower oil.  Avoid cooking with butter, cream, or high-fat meats. Meal planning  Eat meals and snacks regularly, preferably at the same times every day. Avoid going long periods of time without eating.  Eat foods high in fiber, such as fresh fruits, vegetables, beans, and whole grains. Talk to your dietitian about how many servings of carbohydrates you can eat at each meal.  Eat 4-6 ounces of lean protein each day, such as lean meat, chicken, fish, eggs, or tofu. 1 ounce is equal to 1 ounce of meat, chicken, or fish, 1 egg, or 1/4 cup of tofu.  Eat some foods each day that contain healthy fats, such as avocado, nuts, seeds, and fish. Lifestyle   Check your blood glucose regularly.  Exercise at least 30 minutes 5 or more days each week, or as told by your health care  provider.  Take medicines as told by your health care provider.  Do not use any products that contain nicotine or tobacco, such as cigarettes and e-cigarettes. If you need help quitting, ask your health care provider.  Work with a Social worker or diabetes educator to identify strategies to manage stress and any emotional and social challenges. What are some questions to ask my health care provider?  Do I need to meet with a diabetes educator?  Do I need to meet with a dietitian?  What number can I call if I have questions?  When are the best times to check my blood glucose? Where to find more information:  American Diabetes Association: diabetes.org/food-and-fitness/food  Academy of Nutrition and Dietetics: PokerClues.dk  [employer] of Diabetes and Digestive and Kidney Diseases (NIH): ContactWire.be Summary  A healthy meal plan will help you control your blood glucose and maintain a healthy lifestyle.  Working with a diet and nutrition specialist (dietitian) can help you make a meal plan that is best for you.  Keep in mind that carbohydrates and alcohol have immediate effects on your blood glucose  levels. It is important to count carbohydrates and to use alcohol carefully. This information is not intended to replace advice given to you by your health care provider. Make sure you discuss any questions you have with your health care provider. Document Released: 08/22/2005 Document Revised: 12/30/2016 Document Reviewed: 12/30/2016 Elsevier Interactive Patient Education  [employer].

## 2018-05-12 NOTE — Progress Notes (Deleted)
[name] 47 y.o.   Chief Complaint  Patient presents with  . Diabetes    follow up x 3 months - fungal skin infection    HISTORY OF PRESENT ILLNESS: This is a 47 y.o. female complaining of ***. ***.  HPI   Prior to Admission medications   Medication Sig Start Date End Date Taking? Authorizing Provider  aspirin EC 81 MG tablet Take 81 mg by mouth daily.   Yes [provider]  carvedilol (COREG) 3.125 MG tablet TAKE 1 TABLET BY MOUTH TWICE A DAY 03/05/18  Yes Shermika Balthaser, Ines Bloomer, MD  clotrimazole-betamethasone (LOTRISONE) cream Apply to affected area 2 times daily prn Patient taking differently: Apply 1 application topically 2 (two) times daily as needed (rash).  03/14/18  Yes Blue, Olivia C, PA-C  dicyclomine (BENTYL) 10 MG capsule Take 1 capsule (10 mg total) by mouth 3 (three) times daily before meals. 11/04/17  Yes Danis, Kirke Corin, MD  docusate sodium (COLACE) 100 MG capsule Take 1 capsule (100 mg total) by mouth every 12 (twelve) hours. 12/02/17  Yes Khatri, Hina, PA-C  levETIRAcetam (KEPPRA) 500 MG tablet TAKE 1 TABLET BY MOUTH TWICE A DAY 05/11/18  Yes Ward Givens, NP  metFORMIN (GLUCOPHAGE) 500 MG tablet Take 1 tablet (500 mg total) by mouth 2 (two) times daily with a meal. 02/10/18  Yes Dalisha Shively, Ines Bloomer, MD  phenytoin (DILANTIN) 100 MG ER capsule TAKE 2 CAPSULES IN THE MORNING AND 3 CAPSULES IN THE EVENING Patient taking differently: Take 200-300 mg by mouth See admin instructions. TAKE 3 CAPSULES IN THE MORNING AND 2 CAPSULES IN THE EVENING 11/28/17  Yes [name], MD  risperiDONE (RISPERDAL) 0.5 MG tablet Take 1 tablet (0.5 mg total) by mouth 2 (two) times daily. 04/15/18  Yes Ethelene Hal, NP  sitaGLIPtin (JANUVIA) 50 MG tablet Take 1 tablet (50 mg total) by mouth daily. For control of blood sugar 05/08/17  Yes Mikolaj Woolstenhulme, Ines Bloomer, MD  torsemide (DEMADEX) 20 MG tablet Take 1 tablet (20 mg total) by mouth daily. 05/08/17  Yes Jalin Alicea,  Ines Bloomer, MD  ACCU-CHEK [facility] LANCETS lancets Use as instructed 06/06/17   Horald Pollen, MD  aspirin 325 MG tablet Take 1 tablet (325 mg total) by mouth daily. Patient not taking: Reported on 04/23/2018 05/23/17   Barton Dubois, MD  atorvastatin (LIPITOR) 10 MG tablet Take 1 tablet (10 mg total) by mouth daily at 6 PM. 12/23/17 04/23/18  Horald Pollen, MD  DULoxetine (CYMBALTA) 30 MG capsule Take 1 capsule (30 mg total) by mouth daily. 08/06/17 04/13/18  Horald Pollen, MD  glucose blood test strip Use as instructed 06/06/17   Horald Pollen, MD  ibuprofen (ADVIL,MOTRIN) 200 MG tablet Take 400 mg by mouth every 6 (six) hours as needed for headache or mild pain.    [provider]  nystatin (MYCOSTATIN/NYSTOP) powder Apply topically 2 (two) times daily. Patient taking differently: Apply 1 g topically 2 (two) times daily as needed (rash).  03/14/18   Blue, Olivia C, PA-C    Allergies  Allergen Reactions  . Amoxicillin Itching    Has patient had a PCN reaction causing immediate rash, facial/tongue/throat swelling, SOB or lightheadedness with hypotension: yes Has patient had a PCN reaction causing severe rash involving mucus membranes or skin necrosis: no Has patient had a PCN reaction that required hospitalization: no Has patient had a PCN reaction occurring within the last 10 years: yes If all of the above  answers are "NO", then may proceed with Cephalosporin use.  Marland Kitchen Lisinopril Swelling    Angioedema   . Hydrocodone Other (See Comments)    Depressed     Patient Active Problem List   Diagnosis Date Noted  . Hallucinations, unspecified 04/15/2018  . Vaginal odor 02/10/2018  . Fungal skin infection 02/10/2018  . Malodorous urine 10/08/2017  . Lower urinary tract symptoms (LUTS) 10/08/2017  . Acute UTI 10/08/2017  . Constipation 10/08/2017  . History of recent stroke 08/06/2017  . Angioedema 06/03/2017  . OSA (obstructive sleep apnea)   .  Cryptogenic stroke ([place]) 05/20/2017  . Controlled type 2 diabetes mellitus without complication, without long-term current use of insulin ([place]) 05/08/2017  . Tiredness 05/13/2014  . Essential hypertension 07/27/2008  . Convulsions ([place]) 02/05/2007    Past Medical History:  Diagnosis Date  . Anemia   . Anxiety   . Bipolar 1 disorder ([place])   . Common migraine 05/19/2015  . Depression   . Diabetes mellitus, type II ([place])   . Hypertension   . Irritable bowel syndrome (IBS)   . Mild mental retardation   . Obesity   . Partial complex seizure disorder with intractable epilepsy ([place]) 05/12/2014  . Seizures ([place])    intractable, sz 08/23/17  . Sleep apnea   . Stroke ([place])   . Type II or unspecified type diabetes mellitus without mention of complication, not stated as uncontrolled     Past Surgical History:  Procedure Laterality Date  . COLONOSCOPY     2012-normal , Dr Sharlett Iles  . ESOPHAGOGASTRODUODENOSCOPY     normal-Dr Patterson 2012  . LOOP RECORDER INSERTION N/A 05/30/2017   Procedure: Loop Recorder Insertion;  Surgeon: Constance Haw, MD;  Location: [place] [facility];  Service: Cardiovascular;  Laterality: N/A;  . LOOP RECORDER REMOVAL N/A 03/04/2018   Procedure: LOOP RECORDER REMOVAL;  Surgeon: Constance Haw, MD;  Location: [place] [facility];  Service: Cardiovascular;  Laterality: N/A;  . MYRINGOTOMY WITH TUBE PLACEMENT    . MYRINGOTOMY WITH TUBE PLACEMENT Right 11/05/2017   Procedure: MYRINGOTOMY WITH TUBE PLACEMENT;  Surgeon: Melissa Montane, MD;  Location: [place];  Service: ENT;  Laterality: Right;  right T Tube placement  . NASAL SINUS SURGERY    . TEE WITHOUT CARDIOVERSION N/A 05/30/2017   Procedure: TRANSESOPHAGEAL ECHOCARDIOGRAM (TEE);  Surgeon: Acie Fredrickson Wonda Cheng, MD;  Location: [facility] ENDOSCOPY;  Service: Cardiovascular;  Laterality: N/A;    Social History   Socioeconomic History  . Marital status: Single    Spouse name: [name]  . Number of children: 3  . Years  of education: 53  . Highest education level: Not on file  Occupational History  . Occupation: disabled    Employer: DISABLED  Social Needs  . Financial resource strain: Not on file  . Food insecurity:    Worry: Not on file    Inability: Not on file  . Transportation needs:    Medical: Not on file    Non-medical: Not on file  Tobacco Use  . Smoking status: Never Smoker  . Smokeless tobacco: Never Used  Substance and Sexual Activity  . Alcohol use: No  . Drug use: No  . Sexual activity: Never  Lifestyle  . Physical activity:    Days per week: Not on file    Minutes per session: Not on file  . Stress: Not on file  Relationships  . Social connections:    Talks on phone: Not on file  Gets together: Not on file    Attends religious service: Not on file    Active member of club or organization: Not on file    Attends meetings of clubs or organizations: Not on file    Relationship status: Not on file  . Intimate partner violence:    Fear of current or ex partner: Not on file    Emotionally abused: Not on file    Physically abused: Not on file    Forced sexual activity: Not on file  Other Topics Concern  . Not on file  Social History Narrative   Patient lives at home with daughter.    Patient has 3 children.    Patient is right handed.    Patient has a high school education.    Patient is on disability   Patient drinks 2 cups of caffeine daily.    Family History  Problem Relation Age of Onset  . Diabetes Mother        passed away from accidental death  . Hypertension Mother   . Bipolar disorder Father   . Diabetes Daughter   . Leukemia Daughter   . Ovarian cancer Maternal Aunt      ROS  Vitals:   05/12/18 1011  BP: 108/68  Pulse: (!) 103  Resp: 16  Temp: 99.2 F (37.3 C)  SpO2: 97%    Physical Exam   ASSESSMENT & PLAN: ***   Agustina Caroli, MD [facility] [place]

## 2018-05-12 NOTE — Progress Notes (Signed)
Wt Readings from Last 3 Encounters:  05/12/18 231 lb (104.8 kg)  04/23/18 260 lb (117.9 kg)  03/25/18 252 lb (114.3 kg)   Brandi Gomez 47 y.o.   Chief Complaint  Patient presents with  . Diabetes    follow up x 3 months - fungal skin infection    HISTORY OF PRESENT ILLNESS: This is a 47 y.o. female with history of diabetes, seizure disorder, bipolar disorder, patient tolerated hypertension.  Here for follow-up.  Wants her glucose checked. Compliant with medications however she stopped seizure medication several weeks ago and ended up in the hospital with a seizure. Back on Dilantin and Keppra.  Seen by me on 02/10/2018 with a fungal skin infection under her breasts.  Doing much better after using Lotrisone.  HPI   Prior to Admission medications   Medication Sig Start Date End Date Taking? Authorizing Provider  aspirin EC 81 MG tablet Take 81 mg by mouth daily.   Yes [provider]  carvedilol (COREG) 3.125 MG tablet TAKE 1 TABLET BY MOUTH TWICE A DAY 03/05/18  Yes Lauri Purdum, Ines Bloomer, MD  clotrimazole-betamethasone (LOTRISONE) cream Apply to affected area 2 times daily prn Patient taking differently: Apply 1 application topically 2 (two) times daily as needed (rash).  03/14/18  Yes Blue, Olivia C, PA-C  dicyclomine (BENTYL) 10 MG capsule Take 1 capsule (10 mg total) by mouth 3 (three) times daily before meals. 11/04/17  Yes Danis, Kirke Corin, MD  docusate sodium (COLACE) 100 MG capsule Take 1 capsule (100 mg total) by mouth every 12 (twelve) hours. 12/02/17  Yes Khatri, Hina, PA-C  levETIRAcetam (KEPPRA) 500 MG tablet TAKE 1 TABLET BY MOUTH TWICE A DAY 05/11/18  Yes Ward Givens, NP  metFORMIN (GLUCOPHAGE) 500 MG tablet Take 1 tablet (500 mg total) by mouth 2 (two) times daily with a meal. 02/10/18  Yes Daviana Haymaker, Ines Bloomer, MD  phenytoin (DILANTIN) 100 MG ER capsule TAKE 2 CAPSULES IN THE MORNING AND 3 CAPSULES IN THE EVENING Patient taking differently: Take 200-300 mg  by mouth See admin instructions. TAKE 3 CAPSULES IN THE MORNING AND 2 CAPSULES IN THE EVENING 11/28/17  Yes [name], MD  risperiDONE (RISPERDAL) 0.5 MG tablet Take 1 tablet (0.5 mg total) by mouth 2 (two) times daily. 04/15/18  Yes Ethelene Hal, NP  sitaGLIPtin (JANUVIA) 50 MG tablet Take 1 tablet (50 mg total) by mouth daily. For control of blood sugar 05/08/17  Yes Daphney Hopke, Ines Bloomer, MD  torsemide (DEMADEX) 20 MG tablet Take 1 tablet (20 mg total) by mouth daily. 05/08/17  Yes Dossie Swor, Ines Bloomer, MD  ACCU-CHEK [facility] LANCETS lancets Use as instructed 06/06/17   Horald Pollen, MD  aspirin 325 MG tablet Take 1 tablet (325 mg total) by mouth daily. Patient not taking: Reported on 04/23/2018 05/23/17   Barton Dubois, MD  atorvastatin (LIPITOR) 10 MG tablet Take 1 tablet (10 mg total) by mouth daily at 6 PM. 12/23/17 04/23/18  Horald Pollen, MD  DULoxetine (CYMBALTA) 30 MG capsule Take 1 capsule (30 mg total) by mouth daily. 08/06/17 04/13/18  Horald Pollen, MD  glucose blood test strip Use as instructed 06/06/17   Horald Pollen, MD  ibuprofen (ADVIL,MOTRIN) 200 MG tablet Take 400 mg by mouth every 6 (six) hours as needed for headache or mild pain.    [provider]  nystatin (MYCOSTATIN/NYSTOP) powder Apply topically 2 (two) times daily. Patient taking differently: Apply 1 g topically 2 (two)  times daily as needed (rash).  03/14/18   Blue, Olivia C, PA-C    Allergies  Allergen Reactions  . Amoxicillin Itching    Has patient had a PCN reaction causing immediate rash, facial/tongue/throat swelling, SOB or lightheadedness with hypotension: yes Has patient had a PCN reaction causing severe rash involving mucus membranes or skin necrosis: no Has patient had a PCN reaction that required hospitalization: no Has patient had a PCN reaction occurring within the last 10 years: yes If all of the above answers are "NO", then may proceed with  Cephalosporin use.  Marland Kitchen Lisinopril Swelling    Angioedema   . Hydrocodone Other (See Comments)    Depressed     Patient Active Problem List   Diagnosis Date Noted  . Hallucinations, unspecified 04/15/2018  . Vaginal odor 02/10/2018  . Fungal skin infection 02/10/2018  . Malodorous urine 10/08/2017  . Lower urinary tract symptoms (LUTS) 10/08/2017  . Acute UTI 10/08/2017  . Constipation 10/08/2017  . History of recent stroke 08/06/2017  . Angioedema 06/03/2017  . OSA (obstructive sleep apnea)   . Cryptogenic stroke ([place]) 05/20/2017  . Controlled type 2 diabetes mellitus without complication, without long-term current use of insulin ([place]) 05/08/2017  . Tiredness 05/13/2014  . Essential hypertension 07/27/2008  . Convulsions ([place]) 02/05/2007    Past Medical History:  Diagnosis Date  . Anemia   . Anxiety   . Bipolar 1 disorder ([place])   . Common migraine 05/19/2015  . Depression   . Diabetes mellitus, type II ([place])   . Hypertension   . Irritable bowel syndrome (IBS)   . Mild mental retardation   . Obesity   . Partial complex seizure disorder with intractable epilepsy ([place]) 05/12/2014  . Seizures ([place])    intractable, sz 08/23/17  . Sleep apnea   . Stroke ([place])   . Type II or unspecified type diabetes mellitus without mention of complication, not stated as uncontrolled     Past Surgical History:  Procedure Laterality Date  . COLONOSCOPY     2012-normal , Dr Sharlett Iles  . ESOPHAGOGASTRODUODENOSCOPY     normal-Dr Patterson 2012  . LOOP RECORDER INSERTION N/A 05/30/2017   Procedure: Loop Recorder Insertion;  Surgeon: Constance Haw, MD;  Location: [facility];  Service: Cardiovascular;  Laterality: N/A;  . LOOP RECORDER REMOVAL N/A 03/04/2018   Procedure: LOOP RECORDER REMOVAL;  Surgeon: Constance Haw, MD;  Location: [facility];  Service: Cardiovascular;  Laterality: N/A;  . MYRINGOTOMY WITH TUBE PLACEMENT    . MYRINGOTOMY WITH TUBE PLACEMENT Right  11/05/2017   Procedure: MYRINGOTOMY WITH TUBE PLACEMENT;  Surgeon: Melissa Montane, MD;  Location: [facility];  Service: ENT;  Laterality: Right;  right T Tube placement  . NASAL SINUS SURGERY    . TEE WITHOUT CARDIOVERSION N/A 05/30/2017   Procedure: TRANSESOPHAGEAL ECHOCARDIOGRAM (TEE);  Surgeon: Acie Fredrickson Wonda Cheng, MD;  Location[facility] END[facility]COPY;  Service: Cardiovascular;  Laterality: N/A;    Social History   Socioeconomic History  . Marital status: Single    Spouse name: [name]  . Number of children: 3  . Years of education: 35  . Highest education level: Not on file  Occupational History  . Occupation: disabled    Employer: DISABLED  Social Needs  . Financial resource strain: Not on file  . Food insecurity:    Worry: Not on file    Inability: Not on file  . Transportation needs:    Medical: Not on file  Non-medical: Not on file  Tobacco Use  . Smoking status: Never Smoker  . Smokeless tobacco: Never Used  Substance and Sexual Activity  . Alcohol use: No  . Drug use: No  . Sexual activity: Never  Lifestyle  . Physical activity:    Days per week: Not on file    Minutes per session: Not on file  . Stress: Not on file  Relationships  . Social connections:    Talks on phone: Not on file    Gets together: Not on file    Attends religious service: Not on file    Active member of club or organization: Not on file    Attends meetings of clubs or organizations: Not on file    Relationship status: Not on file  . Intimate partner violence:    Fear of current or ex partner: Not on file    Emotionally abused: Not on file    Physically abused: Not on file    Forced sexual activity: Not on file  Other Topics Concern  . Not on file  Social History Narrative   Patient lives at home with daughter.    Patient has 3 children.    Patient is right handed.    Patient has a high school education.    Patient is on disability   Patient drinks 2 cups of caffeine daily.    Family History    Problem Relation Age of Onset  . Diabetes Mother        passed away from accidental death  . Hypertension Mother   . Bipolar disorder Father   . Diabetes Daughter   . Leukemia Daughter   . Ovarian cancer Maternal Aunt      Review of Systems  Constitutional: Negative.  Negative for chills and fever.  HENT: Negative.  Negative for congestion, hearing loss, sinus pain and sore throat.   Eyes: Negative.  Negative for blurred vision and double vision.  Respiratory: Negative.  Negative for cough, hemoptysis and shortness of breath.   Cardiovascular: Negative.  Negative for chest pain and palpitations.  Gastrointestinal: Negative.  Negative for abdominal pain, blood in stool, diarrhea, nausea and vomiting.  Genitourinary: Negative.  Negative for dysuria and hematuria.  Musculoskeletal: Negative.  Negative for myalgias and neck pain.  Skin: Negative.  Negative for rash.  Neurological: Positive for seizures (History of seizure disorder). Negative for dizziness and headaches.  Endo/Heme/Allergies: Negative.   All other systems reviewed and are negative.   Vitals:   05/12/18 1011  BP: 108/68  Pulse: (!) 103  Resp: 16  Temp: 99.2 F (37.3 C)  SpO2: 97%    Physical Exam  Constitutional: She is oriented to person, place, and time. She appears well-developed and well-nourished.  HENT:  Head: Normocephalic and atraumatic.  Nose: Nose normal.  Mouth/Throat: Oropharynx is clear and moist.  Eyes: Pupils are equal, round, and reactive to light. Conjunctivae and EOM are normal.  Neck: Normal range of motion. Neck supple. No JVD present. No thyromegaly present.  Cardiovascular: Normal rate, regular rhythm and normal heart sounds.  Pulmonary/Chest: Effort normal and breath sounds normal.  Abdominal: Soft. Bowel sounds are normal. She exhibits no distension. There is no tenderness.  Musculoskeletal: Normal range of motion.  Lymphadenopathy:    She has no cervical adenopathy.   Neurological: She is alert and oriented to person, place, and time. No sensory deficit. She exhibits normal muscle tone.  Skin: Skin is warm and dry. Capillary refill takes less than 2  seconds.  Psychiatric: She has a normal mood and affect. Her behavior is normal.  Vitals reviewed.  Results for orders placed or performed in visit on 05/12/18 (from the past 24 hour(s))  POCT glycosylated hemoglobin (Hb A1C)     Status: Abnormal   Collection Time: 05/12/18 11:56 AM  Result Value Ref Range   Hemoglobin A1C 6.3 (A) 4.0 - 5.6 %   HbA1c, POC (prediabetic range)  5.7 - 6.4 %   HbA1c, POC (controlled diabetic range)  0.0 - 7.0 %  POCT glucose (manual entry)     Status: Abnormal   Collection Time: 05/12/18 11:56 AM  Result Value Ref Range   POC Glucose 120 (A) 70 - 99 mg/dl   Controlled type 2 diabetes mellitus without complication, without long-term current use of insulin (HCC) Hemoglobin A1c and glucose levels continue to improve.  Continue present medication.  Continue diet and physical activity.  Follow-up in 3-6 months.   A total of 25 minutes was spent in the room with the patient, greater than 50% of which was in counseling/coordination of care regarding chronic medical conditions, medications, lifestyle choices, nutrition, and need for follow-up.   ASSESSMENT & PLAN: Tanita was seen today for diabetes.  Diagnoses and all orders for this visit:  Controlled type 2 diabetes mellitus without complication, without long-term current use of insulin (HCC) -     POCT glycosylated hemoglobin (Hb A1C) -     POCT glucose (manual entry)  History of seizure disorder  Essential hypertension    Patient Instructions       IF you received an x-ray today, you will receive an invoice from [facility]. Please contact [facility] at [phone] with questions or concerns regarding your invoice.   IF you received labwork today, you will receive an invoice from [place].  Please contact [facility] at [phone] with questions or concerns regarding your invoice.   Our billing staff will not be able to assist you with questions regarding bills from these companies.  You will be contacted with the lab results as soon as they are available. The fastest way to get your results is to activate your My Chart account. Instructions are located on the last page of this paperwork. If you have not heard from [place] regarding the results in 2 weeks, please contact this office.     Diabetes Mellitus and Nutrition When you have diabetes (diabetes mellitus), it is very important to have healthy eating habits because your blood sugar (glucose) levels are greatly affected by what you eat and drink. Eating healthy foods in the appropriate amounts, at about the same times every day, can help you:  Control your blood glucose.  Lower your risk of heart disease.  Improve your blood pressure.  Reach or maintain a healthy weight.  Every person with diabetes is different, and each person has different needs for a meal plan. Your health care provider may recommend that you work with a diet and nutrition specialist (dietitian) to make a meal plan that is best for you. Your meal plan may vary depending on factors such as:  The calories you need.  The medicines you take.  Your weight.  Your blood glucose, blood pressure, and cholesterol levels.  Your activity level.  Other health conditions you have, such as heart or kidney disease.  How do carbohydrates affect me? Carbohydrates affect your blood glucose level more than any other type of food. Eating carbohydrates naturally increases the amount of glucose in your blood.  Carbohydrate counting is a method for keeping track of how many carbohydrates you eat. Counting carbohydrates is important to keep your blood glucose at a healthy level, especially if you use insulin or take certain oral diabetes medicines. It is important to know  how many carbohydrates you can safely have in each meal. This is different for every person. Your dietitian can help you calculate how many carbohydrates you should have at each meal and for snack. Foods that contain carbohydrates include:  Bread, cereal, rice, pasta, and crackers.  Potatoes and corn.  Peas, beans, and lentils.  Milk and yogurt.  Fruit and juice.  Desserts, such as cakes, cookies, ice cream, and candy.  How does alcohol affect me? Alcohol can cause a sudden decrease in blood glucose (hypoglycemia), especially if you use insulin or take certain oral diabetes medicines. Hypoglycemia can be a life-threatening condition. Symptoms of hypoglycemia (sleepiness, dizziness, and confusion) are similar to symptoms of having too much alcohol. If your health care provider says that alcohol is safe for you, follow these guidelines:  Limit alcohol intake to no more than 1 drink per day for nonpregnant women and 2 drinks per day for men. One drink equals 12 oz of beer, 5 oz of wine, or 1 oz of hard liquor.  Do not drink on an empty stomach.  Keep yourself hydrated with water, diet soda, or unsweetened iced tea.  Keep in mind that regular soda, juice, and other mixers may contain a lot of sugar and must be counted as carbohydrates.  What are tips for following this plan? Reading food labels  Start by checking the serving size on the label. The amount of calories, carbohydrates, fats, and other nutrients listed on the label are based on one serving of the food. Many foods contain more than one serving per package.  Check the total grams (g) of carbohydrates in one serving. You can calculate the number of servings of carbohydrates in one serving by dividing the total carbohydrates by 15. For example, if a food has 30 g of total carbohydrates, it would be equal to 2 servings of carbohydrates.  Check the number of grams (g) of saturated and trans fats in one serving. Choose foods that  have low or no amount of these fats.  Check the number of milligrams (mg) of sodium in one serving. Most people should limit total sodium intake to less than 2,300 mg per day.  Always check the nutrition information of foods labeled as "low-fat" or "nonfat". These foods may be higher in added sugar or refined carbohydrates and should be avoided.  Talk to your dietitian to identify your daily goals for nutrients listed on the label. Shopping  Avoid buying canned, premade, or processed foods. These foods tend to be high in fat, sodium, and added sugar.  Shop around the outside edge of the grocery store. This includes fresh fruits and vegetables, bulk grains, fresh meats, and fresh dairy. Cooking  Use low-heat cooking methods, such as baking, instead of high-heat cooking methods like deep frying.  Cook using healthy oils, such as olive, canola, or sunflower oil.  Avoid cooking with butter, cream, or high-fat meats. Meal planning  Eat meals and snacks regularly, preferably at the same times every day. Avoid going long periods of time without eating.  Eat foods high in fiber, such as fresh fruits, vegetables, beans, and whole grains. Talk to your dietitian about how many servings of carbohydrates you can eat at each meal.  Eat 4-6  ounces of lean protein each day, such as lean meat, chicken, fish, eggs, or tofu. 1 ounce is equal to 1 ounce of meat, chicken, or fish, 1 egg, or 1/4 cup of tofu.  Eat some foods each day that contain healthy fats, such as avocado, nuts, seeds, and fish. Lifestyle   Check your blood glucose regularly.  Exercise at least 30 minutes 5 or more days each week, or as told by your health care provider.  Take medicines as told by your health care provider.  Do not use any products that contain nicotine or tobacco, such as cigarettes and e-cigarettes. If you need help quitting, ask your health care provider.  Work with a Social worker or diabetes educator to identify  strategies to manage stress and any emotional and social challenges. What are some questions to ask my health care provider?  Do I need to meet with a diabetes educator?  Do I need to meet with a dietitian?  What number can I call if I have questions?  When are the best times to check my blood glucose? Where to find more information:  American Diabetes Association: diabetes.org/food-and-fitness/food  Academy of Nutrition and Dietetics: PokerClues.dk  [employer] of Diabetes and Digestive and Kidney Diseases (NIH): ContactWire.be Summary  A healthy meal plan will help you control your blood glucose and maintain a healthy lifestyle.  Working with a diet and nutrition specialist (dietitian) can help you make a meal plan that is best for you.  Keep in mind that carbohydrates and alcohol have immediate effects on your blood glucose levels. It is important to count carbohydrates and to use alcohol carefully. This information is not intended to replace advice given to you by your health care provider. Make sure you discuss any questions you have with your health care provider. Document Released: 08/22/2005 Document Revised: 12/30/2016 Document Reviewed: 12/30/2016 Elsevier Interactive Patient Education  2018 [employer].      Agustina Caroli, MD [facility]

## 2018-05-15 ENCOUNTER — Telehealth: Payer: Self-pay | Admitting: Neurology

## 2018-05-15 NOTE — Telephone Encounter (Signed)
Pt requesting a call to discuss Dilantin before her 12 o'clock dose.

## 2018-05-15 NOTE — Telephone Encounter (Signed)
I called the patient.  The patient has had some problems with hallucinations when she takes her medications over the last week or so, she has lost some weight, we will need to see her next week and check blood levels.  She was placed on Risperdal but she did not take more than a couple tablets because she did not like the way the medication made her feel.  She is on Cymbalta, but this is not an antipsychotic medication.  We will need to look at the Keppra and Dilantin levels.  Keppra sometimes can result in psychosis.

## 2018-05-15 NOTE — Telephone Encounter (Signed)
Pt said she has lost 20-30 pounds and feels the phenytoin (DILANTIN) 100 MG ER capsule dosing should be adjusted. Pt said she is having hallucinations while dreaming. Pt is wanting to a call back today. Pt also said she is wanting to d/c this medication and will talk to Dr Viona Gilmore about this on Monday. FYI

## 2018-05-18 ENCOUNTER — Encounter: Payer: Self-pay | Admitting: Neurology

## 2018-05-18 ENCOUNTER — Ambulatory Visit (INDEPENDENT_AMBULATORY_CARE_PROVIDER_SITE_OTHER): Payer: Medicare HMO | Admitting: Neurology

## 2018-05-18 ENCOUNTER — Other Ambulatory Visit: Payer: Self-pay

## 2018-05-18 VITALS — BP 116/90 | HR 78 | Ht 59.5 in | Wt 230.0 lb

## 2018-05-18 DIAGNOSIS — R443 Hallucinations, unspecified: Secondary | ICD-10-CM | POA: Diagnosis not present

## 2018-05-18 DIAGNOSIS — R569 Unspecified convulsions: Secondary | ICD-10-CM | POA: Diagnosis not present

## 2018-05-18 MED ORDER — LACOSAMIDE 50 MG PO TABS: ORAL_TABLET | ORAL | 3 refills | Status: DC

## 2018-05-18 NOTE — Progress Notes (Signed)
Reason for visit: Seizures  Brandi Gomez is an 47 y.o. female  History of present illness:  Brandi Gomez is a 47 year old right-handed black female with a history of bipolar disorder and history of seizures.  The patient also has a history of cerebrovascular disease.  She has been in the emergency room several times in early mid May, twice for seizures, once for psychosis.  The patient is not taking her medications properly, she feels that the Dilantin is causing hallucinations.  When she goes to bed at night she has trouble with her mind racing.  She remains on Keppra taking 500 mg twice daily but this is the only seizure medication she is on currently.  The patient comes in today with a friend/caretaker.  The patient has gone off of other medications as well, she will not take her Risperdal as she believes this makes her feel bad.  Past Medical History:  Diagnosis Date  . Anemia   . Anxiety   . Bipolar 1 disorder ([place])   . Common migraine 05/19/2015  . Depression   . Diabetes mellitus, type II ([place])   . Hypertension   . Irritable bowel syndrome (IBS)   . Mild mental retardation   . Obesity   . Partial complex seizure disorder with intractable epilepsy ([place]) 05/12/2014  . Seizures ([place])    intractable, sz 08/23/17  . Sleep apnea   . Stroke ([place])   . Type II or unspecified type diabetes mellitus without mention of complication, not stated as uncontrolled     Past Surgical History:  Procedure Laterality Date  . COLONOSCOPY     2012-normal , Dr Sharlett Iles  . ESOPHAGOGASTRODUODENOSCOPY     normal-Dr Patterson 2012  . LOOP RECORDER INSERTION N/A 05/30/2017   Procedure: Loop Recorder Insertion;  Surgeon: Constance Haw, MD;  Location: [facility];  Service: Cardiovascular;  Laterality: N/A;  . LOOP RECORDER REMOVAL N/A 03/04/2018   Procedure: LOOP RECORDER REMOVAL;  Surgeon: Constance Haw, MD;  Location: [facility];  Service: Cardiovascular;  Laterality:  N/A;  . MYRINGOTOMY WITH TUBE PLACEMENT    . MYRINGOTOMY WITH TUBE PLACEMENT Right 11/05/2017   Procedure: MYRINGOTOMY WITH TUBE PLACEMENT;  Surgeon: Melissa Montane, MD;  Location: [facility];  Service: ENT;  Laterality: Right;  right T Tube placement  . NASAL SINUS SURGERY    . TEE WITHOUT CARDIOVERSION N/A 05/30/2017   Procedure: TRANSESOPHAGEAL ECHOCARDIOGRAM (TEE);  Surgeon: Acie Fredrickson Wonda Cheng, MD;  Location: [facility] END[facility]COPY;  Service: Cardiovascular;  Laterality: N/A;    Family History  Problem Relation Age of Onset  . Diabetes Mother        passed away from accidental death  . Hypertension Mother   . Bipolar disorder Father   . Diabetes Daughter   . Leukemia Daughter   . Ovarian cancer Maternal Aunt     Social history:  reports that she has never smoked. She has never used smokeless tobacco. She reports that she does not drink alcohol or use drugs.    Allergies  Allergen Reactions  . Amoxicillin Itching    Has patient had a PCN reaction causing immediate rash, facial/tongue/throat swelling, SOB or lightheadedness with hypotension: yes Has patient had a PCN reaction causing severe rash involving mucus membranes or skin necrosis: no Has patient had a PCN reaction that required hospitalization: no Has patient had a PCN reaction occurring within the last 10 years: yes If all of the above answers are "NO", then  may proceed with Cephalosporin use.  Marland Kitchen Lisinopril Swelling    Angioedema   . Hydrocodone Other (See Comments)    Depressed     Medications:  Prior to Admission medications   Medication Sig Start Date End Date Taking? Authorizing Provider  levETIRAcetam (KEPPRA) 500 MG tablet TAKE 1 TABLET BY MOUTH TWICE A DAY 05/11/18  Yes Millikan, Megan, NP  sitaGLIPtin (JANUVIA) 50 MG tablet Take 1 tablet (50 mg total) by mouth daily. For control of blood sugar 05/08/17  Yes Sagardia, Ines Bloomer, MD  torsemide (DEMADEX) 20 MG tablet Take 1 tablet (20 mg total) by mouth daily. 05/08/17  Yes  Sagardia, Ines Bloomer, MD  atorvastatin (LIPITOR) 10 MG tablet Take 1 tablet (10 mg total) by mouth daily at 6 PM. 12/23/17 04/23/18  Sagardia, Ines Bloomer, MD  DULoxetine (CYMBALTA) 30 MG capsule Take 1 capsule (30 mg total) by mouth daily. 08/06/17 04/13/18  Horald Pollen, MD  lacosamide (VIMPAT) 50 MG TABS tablet One tablet twice a day for 2 weeks, then take 2 tablets twice a day 05/18/18   [name], MD    ROS:  Out of a complete 14 system review of symptoms, the patient complains only of the following symptoms, and all other reviewed systems are negative.  Hearing loss Memory loss, seizure  Blood pressure 116/90, pulse 78, height 4' 11.5" (1.511 m), weight 230 lb (104.3 kg).  Physical Exam  General: The patient is alert and cooperative at the time of the examination.  The patient is markedly obese.  Skin: No significant peripheral edema is noted.   Neurologic Exam  Mental status: The patient is alert and oriented x 3 at the time of the examination. The patient has apparent normal recent and remote memory, with an apparently normal attention span and concentration ability.   Cranial nerves: Facial symmetry is present. Speech is normal, no aphasia or dysarthria is noted. Extraocular movements are full. Visual fields are full.  Motor: The patient has good strength in all 4 extremities.  Sensory examination: Soft touch sensation is symmetric on the face, arms, and legs.  Coordination: The patient has good finger-nose-finger and heel-to-shin bilaterally.  Gait and station: The patient has a normal gait. Tandem gait is slightly unsteady. Romberg is positive, the patient has a tendency to fall over. No drift is seen.  Reflexes: Deep tendon reflexes are symmetric.   Assessment/Plan:  1.  Seizures  2.  Cerebrovascular disease  3.  Psychosis, bipolar disorder  The patient is on Keppra, given the history of hallucinations and psychosis, I am reluctant to go up on  the dose of Keppra.  We will add Vimpat and start at 50 mg twice daily and then go to 100 mg twice daily.  If she does well on this medication the Keppra may be discontinued in the future.  She will follow-up in 3 months.  If the patient does not tolerate the medication she is to contact me before she stops the drug.  Jill Alexanders MD 05/18/2018 3:09 PM  [facility] [address] [place], [place] [address]  Phone [phone] Fax [phone]

## 2018-05-18 NOTE — Patient Instructions (Signed)
We will start Vimpat to take in place of the Dilantin. Stay on the [place] for now.

## 2018-05-22 ENCOUNTER — Encounter (HOSPITAL_COMMUNITY): Payer: Self-pay

## 2018-05-22 ENCOUNTER — Other Ambulatory Visit: Payer: Self-pay

## 2018-05-22 ENCOUNTER — Emergency Department (HOSPITAL_COMMUNITY)
Admission: EM | Admit: 2018-05-22 | Discharge: 2018-05-23 | Disposition: A | Discharge status: Home / Self Care | Payer: Medicare HMO | Attending: Emergency Medicine | Admitting: Emergency Medicine

## 2018-05-22 DIAGNOSIS — R410 Disorientation, unspecified: Secondary | ICD-10-CM | POA: Diagnosis not present

## 2018-05-22 DIAGNOSIS — Z8673 Personal history of transient ischemic attack (TIA), and cerebral infarction without residual deficits: Secondary | ICD-10-CM | POA: Diagnosis not present

## 2018-05-22 DIAGNOSIS — I10 Essential (primary) hypertension: Secondary | ICD-10-CM | POA: Diagnosis not present

## 2018-05-22 DIAGNOSIS — Z79899 Other long term (current) drug therapy: Secondary | ICD-10-CM | POA: Insufficient documentation

## 2018-05-22 DIAGNOSIS — Z7984 Long term (current) use of oral hypoglycemic drugs: Secondary | ICD-10-CM | POA: Diagnosis not present

## 2018-05-22 DIAGNOSIS — R569 Unspecified convulsions: Secondary | ICD-10-CM | POA: Diagnosis not present

## 2018-05-22 DIAGNOSIS — F70 Mild intellectual disabilities: Secondary | ICD-10-CM | POA: Insufficient documentation

## 2018-05-22 DIAGNOSIS — G40909 Epilepsy, unspecified, not intractable, without status epilepticus: Secondary | ICD-10-CM

## 2018-05-22 DIAGNOSIS — R0902 Hypoxemia: Secondary | ICD-10-CM | POA: Diagnosis not present

## 2018-05-22 DIAGNOSIS — R443 Hallucinations, unspecified: Secondary | ICD-10-CM | POA: Diagnosis not present

## 2018-05-22 DIAGNOSIS — E119 Type 2 diabetes mellitus without complications: Secondary | ICD-10-CM | POA: Insufficient documentation

## 2018-05-22 DIAGNOSIS — R402441 Other coma, without documented Glasgow coma scale score, or with partial score reported, in the field [EMT or ambulance]: Secondary | ICD-10-CM | POA: Diagnosis not present

## 2018-05-22 LAB — URINALYSIS, ROUTINE W REFLEX MICROSCOPIC
Glucose, UA: NEGATIVE mg/dL
Hgb urine dipstick: NEGATIVE
Ketones, ur: 80 mg/dL — AB
Leukocytes, UA: NEGATIVE
Nitrite: NEGATIVE
Protein, ur: 100 mg/dL — AB
Specific Gravity, Urine: 1.031 — ABNORMAL HIGH (ref 1.005–1.030)
pH: 5 (ref 5.0–8.0)

## 2018-05-22 LAB — BASIC METABOLIC PANEL
Anion gap: 10 (ref 5–15)
BUN: 12 mg/dL (ref 6–20)
CO2: 31 mmol/L (ref 22–32)
Calcium: 8.5 mg/dL — ABNORMAL LOW (ref 8.9–10.3)
Chloride: 97 mmol/L — ABNORMAL LOW (ref 101–111)
Creatinine, Ser: 1.04 mg/dL — ABNORMAL HIGH (ref 0.44–1.00)
GFR calc Af Amer: >60 mL/min (ref >60)
GFR calc non Af Amer: >60 mL/min (ref >60)
Glucose, Bld: 197 mg/dL — ABNORMAL HIGH (ref 65–99)
Potassium: 3.1 mmol/L — ABNORMAL LOW (ref 3.5–5.1)
Sodium: 138 mmol/L (ref 135–145)

## 2018-05-22 LAB — CBC WITH DIFFERENTIAL/PLATELET
Basophils Absolute: 0 10*3/uL (ref 0.0–0.1)
Basophils Relative: 0 %
Eosinophils Absolute: 0 10*3/uL (ref 0.0–0.7)
Eosinophils Relative: 0 %
HCT: 35 % — ABNORMAL LOW (ref 36.0–46.0)
Hemoglobin: 11.8 g/dL — ABNORMAL LOW (ref 12.0–15.0)
Lymphocytes Relative: 17 %
Lymphs Abs: 1.1 10*3/uL (ref 0.7–4.0)
MCH: 31.1 pg (ref 26.0–34.0)
MCHC: 33.7 g/dL (ref 30.0–36.0)
MCV: 92.3 fL (ref 78.0–100.0)
Monocytes Absolute: 0.2 10*3/uL (ref 0.1–1.0)
Monocytes Relative: 4 %
Neutro Abs: 5 10*3/uL (ref 1.7–7.7)
Neutrophils Relative %: 79 %
Platelets: 197 10*3/uL (ref 150–400)
RBC: 3.79 MIL/uL — ABNORMAL LOW (ref 3.87–5.11)
RDW: 12.6 % (ref 11.5–15.5)
WBC: 6.4 10*3/uL (ref 4.0–10.5)

## 2018-05-22 LAB — RAPID URINE DRUG SCREEN, HOSP PERFORMED
Amphetamines: NOT DETECTED
Benzodiazepines: NOT DETECTED
Cocaine: NOT DETECTED
Opiates: NOT DETECTED
Tetrahydrocannabinol: NOT DETECTED

## 2018-05-22 MED ORDER — LORAZEPAM 2 MG/ML IJ SOLN
INTRAMUSCULAR | Status: AC
  Administered 2018-05-22: 1 mg
  Filled 2018-05-22: qty 1

## 2018-05-22 MED ORDER — KETOROLAC TROMETHAMINE 30 MG/ML IJ SOLN
30.0000 mg | Freq: Once | INTRAMUSCULAR | Status: AC
  Administered 2018-05-22: 30 mg via INTRAVENOUS
  Filled 2018-05-22: qty 1

## 2018-05-22 MED ORDER — LEVETIRACETAM IN NACL 1000 MG/100ML IV SOLN
1000.0000 mg | Freq: Once | INTRAVENOUS | Status: AC
  Administered 2018-05-22: 1000 mg via INTRAVENOUS
  Filled 2018-05-22: qty 100

## 2018-05-22 MED ORDER — LEVETIRACETAM 500 MG PO TABS
1000.0000 mg | ORAL_TABLET | Freq: Once | ORAL | Status: DC
  Filled 2018-05-22: qty 2

## 2018-05-22 NOTE — ED Triage Notes (Addendum)
Per EMS-  Pt heard having seizures by daughter .  Husband came home and found pt postictal. Some confusion noted. Pt is non compliant with seizure meds. Pt states meds make her hallucinate. FSBG 174 .Per husband, pt has been hallucinating, but has been seeing mental health professional. Per husband pt has had 4 seizures in 2 weeks. Hx of schizoaffective disorder, and has not been taking medication for taht either. Per husband, pt talks to non existant objects at home and has video of such. Pt has not been recognizing husband of 23 years- per husband. Pt denies hallucinations, SI, HI

## 2018-05-22 NOTE — ED Notes (Signed)
Seizure at 2239 05/22/2018

## 2018-05-22 NOTE — ED Provider Notes (Addendum)
[facility] DEPT Provider Note   CSN: [id number] Arrival date & time: 05/22/18  1742     History   Chief Complaint Chief Complaint  Patient presents with  . Seizures    HPI Brandi Gomez is a 46 y.o. female.  HPI Patient presents to the emergency department with a seizure that occurred just prior to arrival.  The patient states that she has not been taking her medications for seizure because it caused her to have a headache.  She states that her neurologist recently increased the dosage but she has not been taking it.  Patient states that she does not like the side effect of the headache.  She states that nothing seems to make the condition better or worse.  She states that she has had no other issues recently.  The patient denies chest pain, shortness of breath, headache,blurred vision, neck pain, fever, cough, weakness, numbness, dizziness, anorexia, edema, abdominal pain, nausea, vomiting, diarrhea, rash, back pain, dysuria, hematemesis, bloody stool, near syncope, or syncope. Past Medical History:  Diagnosis Date  . Anemia   . Anxiety   . Bipolar 1 disorder ([place])   . Common migraine 05/19/2015  . Depression   . Diabetes mellitus, type II ([place])   . Hypertension   . Irritable bowel syndrome (IBS)   . Mild mental retardation   . Obesity   . Partial complex seizure disorder with intractable epilepsy ([place]) 05/12/2014  . Seizures ([place])    intractable, sz 08/23/17  . Sleep apnea   . Stroke ([place])   . Type II or unspecified type diabetes mellitus without mention of complication, not stated as uncontrolled     Patient Active Problem List   Diagnosis Date Noted  . Hallucinations, unspecified 04/15/2018  . Vaginal odor 02/10/2018  . Fungal skin infection 02/10/2018  . Malodorous urine 10/08/2017  . Lower urinary tract symptoms (LUTS) 10/08/2017  . Acute UTI 10/08/2017  . Constipation 10/08/2017  . History of recent stroke 08/06/2017  .  Angioedema 06/03/2017  . OSA (obstructive sleep apnea)   . Cryptogenic stroke ([place]) 05/20/2017  . Controlled type 2 diabetes mellitus without complication, without long-term current use of insulin ([place]) 05/08/2017  . Tiredness 05/13/2014  . Essential hypertension 07/27/2008  . Convulsions ([place]) 02/05/2007    Past Surgical History:  Procedure Laterality Date  . COLONOSCOPY     2012-normal , Dr Sharlett Iles  . ESOPHAGOGASTRODUODENOSCOPY     normal-Dr Patterson 2012  . LOOP RECORDER INSERTION N/A 05/30/2017   Procedure: Loop Recorder Insertion;  Surgeon: Constance Haw, MD;  Location: [facility];  Service: Cardiovascular;  Laterality: N/A;  . LOOP RECORDER REMOVAL N/A 03/04/2018   Procedure: LOOP RECORDER REMOVAL;  Surgeon: Constance Haw, MD;  Location: [facility];  Service: Cardiovascular;  Laterality: N/A;  . MYRINGOTOMY WITH TUBE PLACEMENT    . MYRINGOTOMY WITH TUBE PLACEMENT Right 11/05/2017   Procedure: MYRINGOTOMY WITH TUBE PLACEMENT;  Surgeon: Melissa Montane, MD;  Location: [facility];  Service: ENT;  Laterality: Right;  right T Tube placement  . NASAL SINUS SURGERY    . TEE WITHOUT CARDIOVERSION N/A 05/30/2017   Procedure: TRANSESOPHAGEAL ECHOCARDIOGRAM (TEE);  Surgeon: Acie Fredrickson Wonda Cheng, MD;  Location: [facility] ENDOSCOPY;  Service: Cardiovascular;  Laterality: N/A;     OB History   None      Home Medications    Prior to Admission medications   Medication Sig Start Date End Date Taking? Authorizing Provider  acetaminophen (  TYLENOL) 500 MG tablet Take 500 mg by mouth every 6 (six) hours as needed for headache.   Yes [provider]  sitaGLIPtin (JANUVIA) 50 MG tablet Take 1 tablet (50 mg total) by mouth daily. For control of blood sugar 05/08/17  Yes Sagardia, Ines Bloomer, MD  torsemide (DEMADEX) 20 MG tablet Take 1 tablet (20 mg total) by mouth daily. 05/08/17  Yes Sagardia, Ines Bloomer, MD  atorvastatin (LIPITOR) 10 MG tablet Take 1 tablet (10 mg total) by  mouth daily at 6 PM. 12/23/17 04/23/18  Sagardia, Ines Bloomer, MD  DULoxetine (CYMBALTA) 30 MG capsule Take 1 capsule (30 mg total) by mouth daily. 08/06/17 04/13/18  Horald Pollen, MD  lacosamide (VIMPAT) 50 MG TABS tablet One tablet twice a day for 2 weeks, then take 2 tablets twice a day 05/18/18   [name], MD  levETIRAcetam (KEPPRA) 500 MG tablet TAKE 1 TABLET BY MOUTH TWICE A DAY 05/11/18   Ward Givens, NP    Family History Family History  Problem Relation Age of Onset  . Diabetes Mother        passed away from accidental death  . Hypertension Mother   . Bipolar disorder Father   . Diabetes Daughter   . Leukemia Daughter   . Ovarian cancer Maternal Aunt     Social History Social History   Tobacco Use  . Smoking status: Never Smoker  . Smokeless tobacco: Never Used  Substance Use Topics  . Alcohol use: No  . Drug use: No     Allergies   Amoxicillin; Lisinopril; and Hydrocodone   Review of Systems Review of Systems All other systems negative except as documented in the HPI. All pertinent positives and negatives as reviewed in the HPI.  Physical Exam Updated Vital Signs BP 103/63   Pulse 81   Temp 97.8 F (36.6 C) (Oral)   Resp 15   Ht 4' 11.5" (1.511 m)   Wt 104.3 kg (230 lb)   SpO2 100%   BMI 45.68 kg/m   Physical Exam  Constitutional: She is oriented to person, place, and time. She appears well-developed and well-nourished. No distress.  HENT:  Head: Normocephalic and atraumatic.  Mouth/Throat: Oropharynx is clear and moist.  Eyes: Pupils are equal, round, and reactive to light.  Neck: Normal range of motion. Neck supple.  Cardiovascular: Normal rate, regular rhythm and normal heart sounds. Exam reveals no gallop and no friction rub.  No murmur heard. Pulmonary/Chest: Effort normal and breath sounds normal. No respiratory distress. She has no wheezes.  Neurological: She is alert and oriented to person, place, and time. No sensory  deficit. She exhibits normal muscle tone. Coordination normal.  Skin: Skin is warm and dry. Capillary refill takes less than 2 seconds. No rash noted. No erythema.  Psychiatric: She has a normal mood and affect. Her behavior is normal.  Nursing note and vitals reviewed.    ED Treatments / Results  Labs (all labs ordered are listed, but only abnormal results are displayed) Labs Reviewed  BASIC METABOLIC PANEL - Abnormal; Notable for the following components:      Result Value   Potassium 3.1 (*)    Chloride 97 (*)    Glucose, Bld 197 (*)    Creatinine, Ser 1.04 (*)    Calcium 8.5 (*)    All other components within normal limits  CBC WITH DIFFERENTIAL/PLATELET - Abnormal; Notable for the following components:   RBC 3.79 (*)    Hemoglobin 11.8 (*)  HCT 35.0 (*)    All other components within normal limits  URINALYSIS, ROUTINE W REFLEX MICROSCOPIC - Abnormal; Notable for the following components:   Color, Urine AMBER (*)    APPearance HAZY (*)    Specific Gravity, Urine 1.031 (*)    Bilirubin Urine SMALL (*)    Ketones, ur 80 (*)    Protein, ur 100 (*)    Bacteria, UA RARE (*)    All other components within normal limits  RAPID URINE DRUG SCREEN, HOSP PERFORMED - Abnormal; Notable for the following components:   Barbiturates   (*)    Value: Result not available. Reagent lot number recalled by manufacturer.   All other components within normal limits    EKG None  Radiology No results found.  Procedures Procedures (including critical care time)  Medications Ordered in ED Medications  ketorolac (TORADOL) 30 MG/ML injection 30 mg (has no administration in time range)     Initial Impression / Assessment and Plan / ED Course  I have reviewed the triage vital signs and the nursing notes.  Pertinent labs & imaging results that were available during my care of the patient were reviewed by me and considered in my medical decision making (see chart for details).      Patient is advised he will need to start taking her medications as prescribed.  I did advise her to call her neurologist for further evaluation and follow-up and possible medication changes if they are causing her to many symptoms.  Patient agrees to this plan and all questions were answered.  Addendum: Upon attempting to discharge patient the patient had another seizure.  She was given Ativan IV and monitored and she was then ordered Keppra IV as well.  The patient does not take her medications and states that she does not like the side effects.  The patient will be observed about Lorre Munroe, PA-C.  I feel that unless the patient does not get back to baseline she will need to be admitted otherwise she will be discharged home due to the fact that she is noncompliant with her medications and admission to the hospital will not successfully change this most likely.  Final Clinical Impressions(s) / ED Diagnoses   Final diagnoses:  None    ED Discharge Orders    None       Dalia Heading, PA-C 05/22/18 2219    Dalia Heading, PA-C 05/23/18 0029    Hayden Rasmussen, MD 05/23/18 1048

## 2018-05-22 NOTE — Discharge Instructions (Addendum)
Make sure that you are taking your seizure medications.  Follow-up with your neurologist for further evaluation.  Your testing here tonight did not show any significant abnormalities.

## 2018-05-23 NOTE — ED Provider Notes (Signed)
2:51 AM A&Ox4.  DC to home per original plan to follow-up with neurology.   Montine Circle, PA-C 05/23/18 0251    Orpah Greek, MD 05/23/18 [phone]

## 2018-05-25 ENCOUNTER — Telehealth: Payer: Self-pay | Admitting: Neurology

## 2018-05-25 NOTE — Telephone Encounter (Signed)
Pt called stating that Dr. Jannifer Franklin upped her dosing for levETIRAcetam (KEPPRA) 500 MG tablet, Stating that looking into the office visit report it looks like he kept the Keppra dosing the change but did start her on Vimpat. Pt states "that isnt what im talking about". Pt states the dosing change for keppra has caused s/e such as headaches, an a overall sick feeling. Pt requesting a call back to discuss the medication.

## 2018-05-25 NOTE — Telephone Encounter (Signed)
I called the patient and talk with the caretaker.  It does not appear that they picked up the prescription for Vimpat, I did not go up on the [place], they are to continue the current dose of 500 mg twice daily, we will add Vimpat to this, if she tolerates Vimpat well, we will eventually switch to Vimpat and slowly discontinue the Keppra.

## 2018-06-01 ENCOUNTER — Other Ambulatory Visit: Payer: Self-pay | Admitting: Emergency Medicine

## 2018-06-01 DIAGNOSIS — I10 Essential (primary) hypertension: Secondary | ICD-10-CM

## 2018-06-15 ENCOUNTER — Other Ambulatory Visit: Payer: Self-pay | Admitting: Adult Health

## 2018-06-23 ENCOUNTER — Ambulatory Visit: Payer: Medicare HMO | Admitting: Emergency Medicine

## 2018-06-26 IMAGING — MR MR MRA HEAD W/O CM
1 series · 19 of 48 positions shown · non-contrast
Comparison: MRI of the head May 20, 2017 at 5159 hours

CLINICAL DATA: Acute onset LEFT extremity numbness. History of
seizures, diabetes, hypertension, bipolar disorder, migraines.

EXAM:
MRA HEAD WITHOUT CONTRAST
TECHNIQUE: Angiographic images of the Circle of Willis were obtained using MRA
technique without intravenous contrast.

[Series 3: ax (id) 2 · axial · 1.0mm · 0.43mm/px · z∈[-62,+24]mm · 19 of 184 slices shown]
[im 1/184]
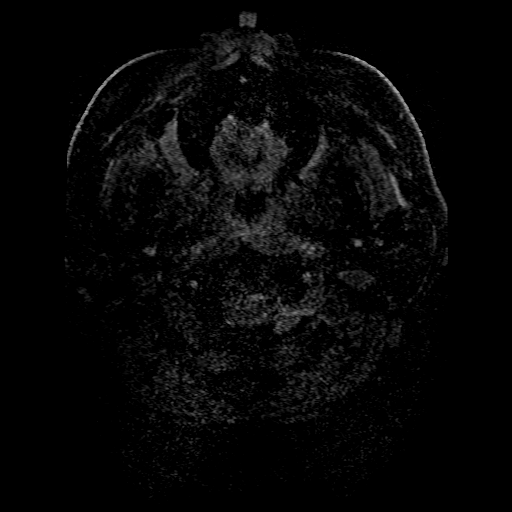
[im 4/184]
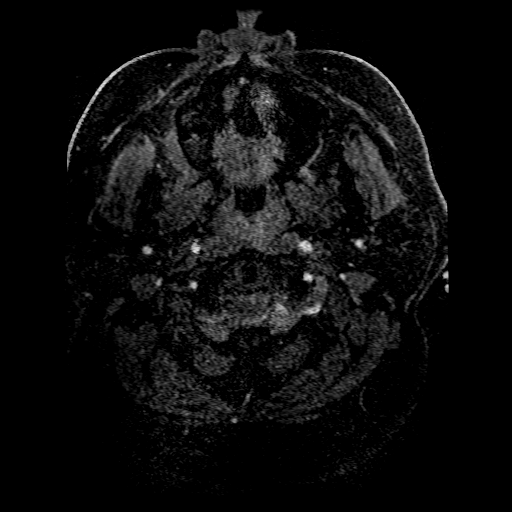
[im 8/184]
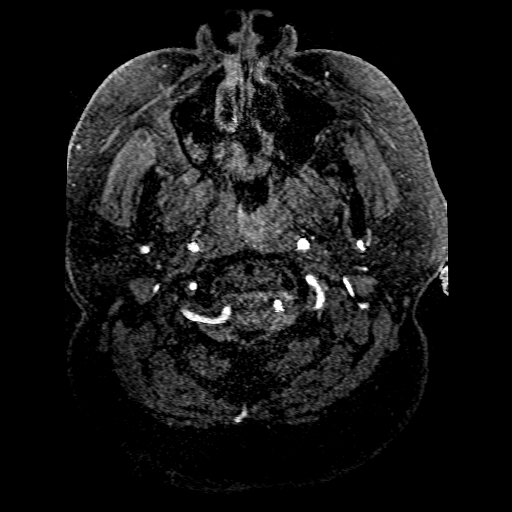
[im 12/184]
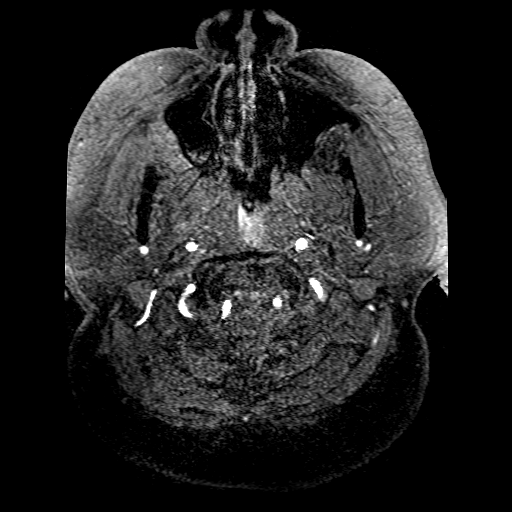
[im 16/184]
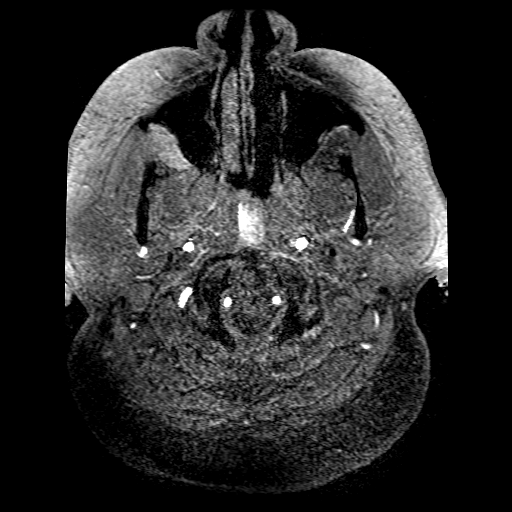
[im 20/184]
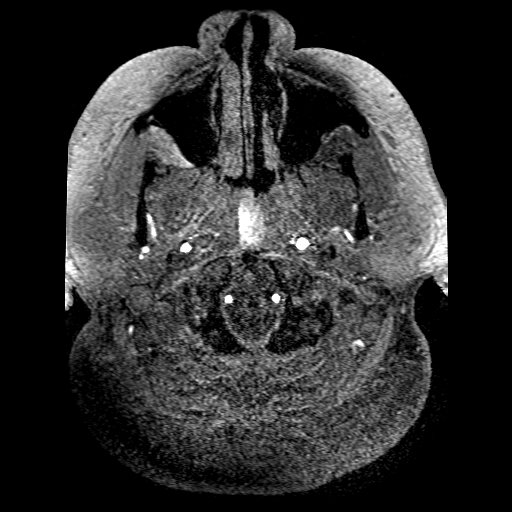
[im 24/184]
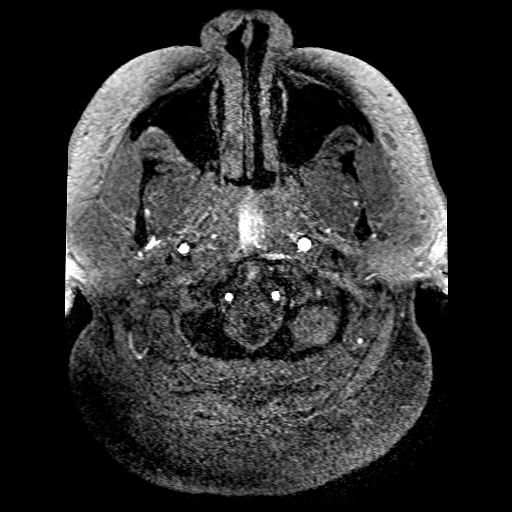
[im 28/184]
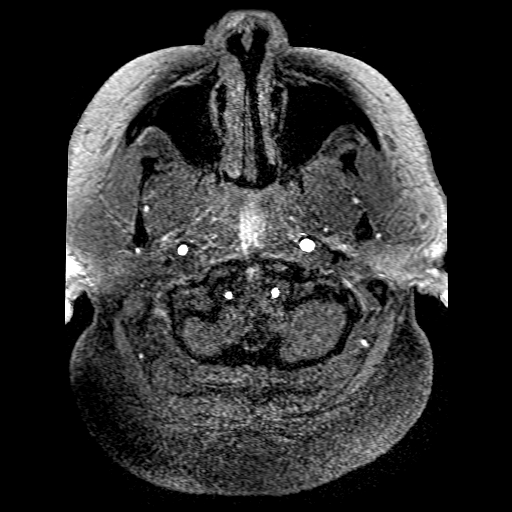
[im 32/184]
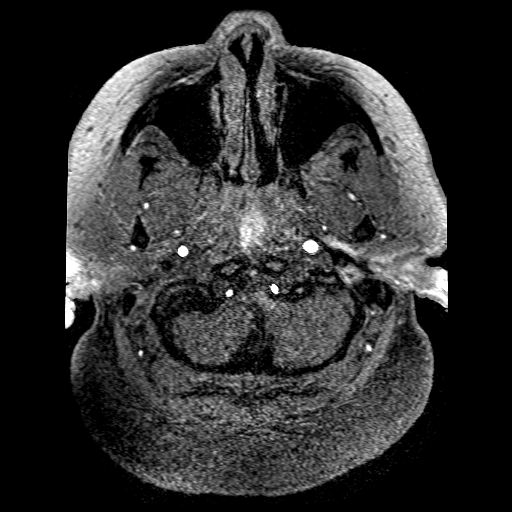
[im 36/184]
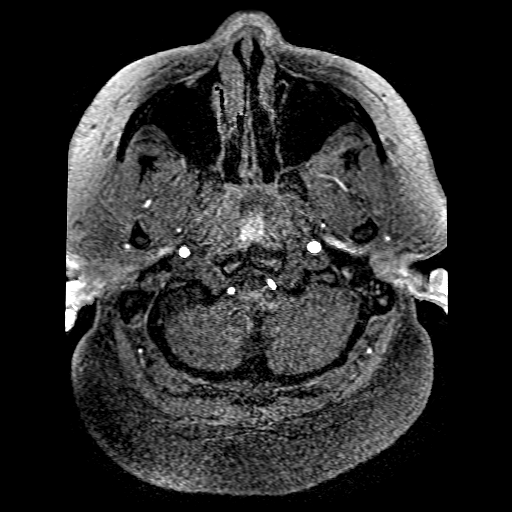
[im 39/184]
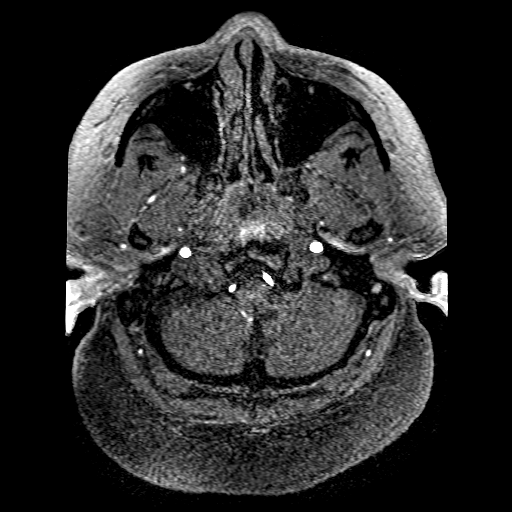
[im 59/184]
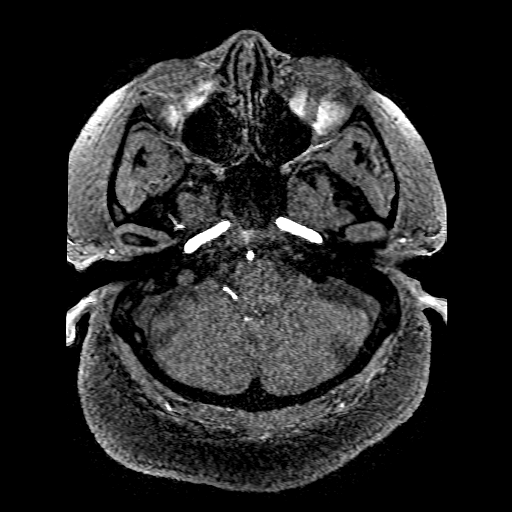
[im 82/184]
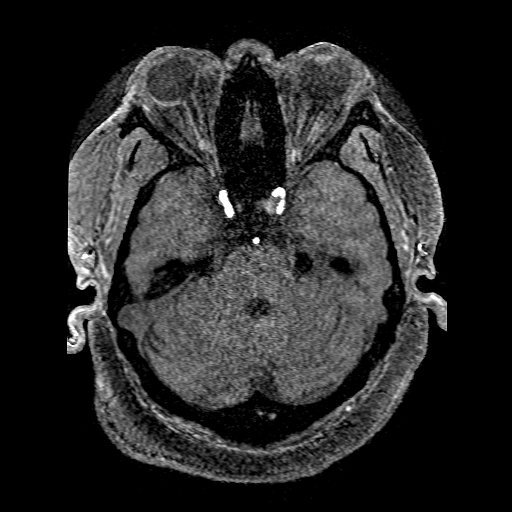
[im 94/184]
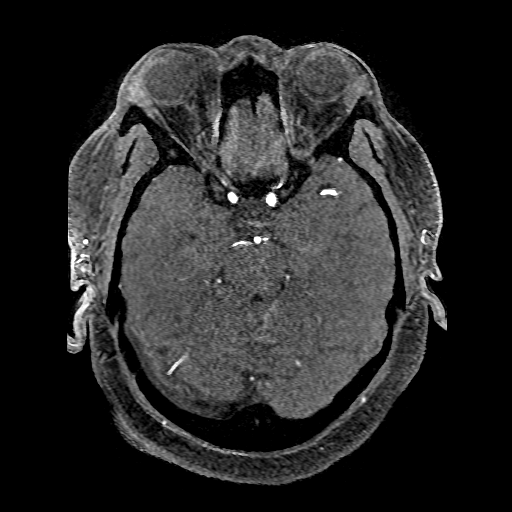
[im 106/184]
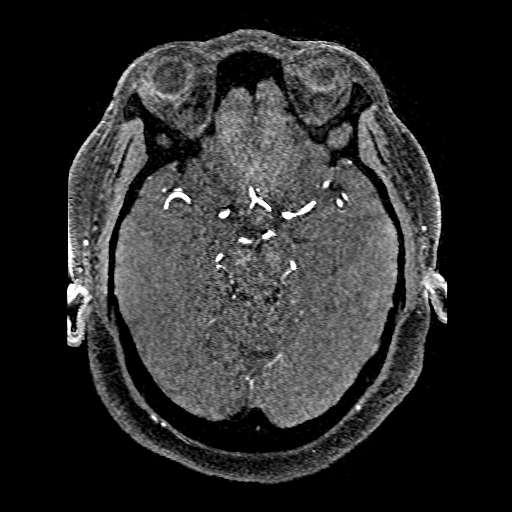
[im 129/184]
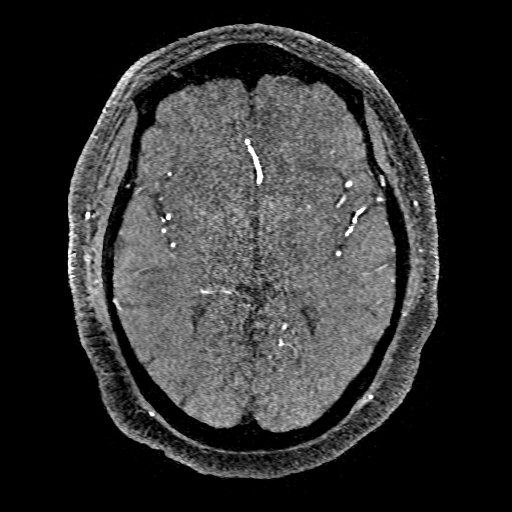
[im 152/184]
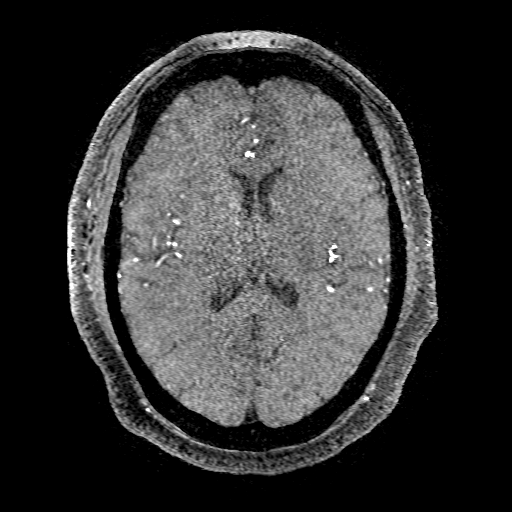
[im 156/184]
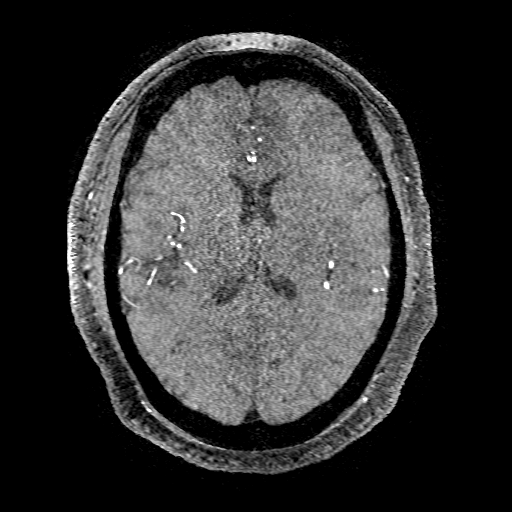
[im 176/184]
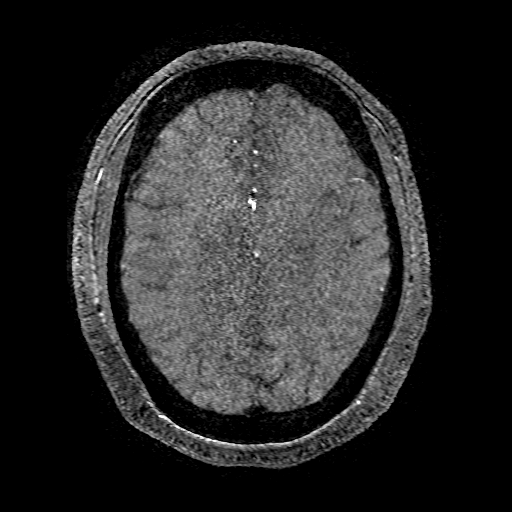

[19 of 48 positions shown; findings below may reference images not displayed]

FINDINGS: ANTERIOR CIRCULATION: Normal flow related enhancement of the
included cervical, petrous, cavernous and supraclinoid internal
carotid arteries. Patent anterior communicating artery. Normal flow
related enhancement of the anterior and middle cerebral arteries,
including distal segments.

No large vessel occlusion, high-grade stenosis, abnormal luminal
irregularity, aneurysm.

POSTERIOR CIRCULATION: Codominant vertebral artery's. Basilar artery
is patent, with normal flow related enhancement of the main branch
vessels. Normal flow related enhancement of the posterior cerebral
arteries.

No large vessel occlusion, high-grade stenosis, abnormal luminal
irregularity, aneurysm.

ANATOMIC VARIANTS: None.

Source images and MIP images were reviewed.
IMPRESSION: Negative MRA head.

## 2018-07-19 ENCOUNTER — Other Ambulatory Visit: Payer: Self-pay | Admitting: Emergency Medicine

## 2018-07-19 DIAGNOSIS — E119 Type 2 diabetes mellitus without complications: Secondary | ICD-10-CM

## 2018-07-20 NOTE — Telephone Encounter (Signed)
refill Last Refill:05/08/18 #90 tabs with 1 refill Last OV: 05/12/18 RTQ:[id number] Pharmacy:[id number]

## 2018-08-04 ENCOUNTER — Other Ambulatory Visit: Payer: Self-pay | Admitting: Emergency Medicine

## 2018-08-04 NOTE — Telephone Encounter (Signed)
Duloxetine hcl 30 mg refill Last Refill:08/06/17 # 30 and 3 refills Last OV: 05/12/18 PCP: Leane Platt [facility] [phone]  This medication was stopped on 04/13/18

## 2018-08-12 NOTE — Telephone Encounter (Signed)
Pt states the mental health dr Reubin Milan see her until October 5.  Pt is out of the  DULoxetine (CYMBALTA) 30 MG capsule  Would like to know if Dr Mitchel Honour will fill this until she can see him?  [id number] [address], [place] [phone] (Phone) [phone] (Fax)

## 2018-08-13 NOTE — Telephone Encounter (Signed)
This medication was stopped on 04/13/18 for a reason not clear to me. Do not advise to restart until seen by psychiatrist.

## 2018-08-13 NOTE — Telephone Encounter (Signed)
Dr Mitchel Honour advise please.  Cymbalta rx has expired.  Will need new rx. Dgaddy, CMA

## 2018-08-13 NOTE — Telephone Encounter (Signed)
Spoke with pt an advised rx expired on 04/13/18 for a reason unclear to provider and he doesn't advise to restart until seen by psychiatrist.  Pt advises she is all out but is agreeable with dr sagardia decision. Dgaddy, CMA

## 2018-08-24 ENCOUNTER — Emergency Department (HOSPITAL_COMMUNITY)
Admission: EM | Admit: 2018-08-24 | Discharge: 2018-08-24 | Disposition: A | Discharge status: Home / Self Care | Payer: Medicare HMO | Attending: Emergency Medicine | Admitting: Emergency Medicine

## 2018-08-24 ENCOUNTER — Encounter (HOSPITAL_COMMUNITY): Payer: Self-pay | Admitting: *Deleted

## 2018-08-24 ENCOUNTER — Other Ambulatory Visit: Payer: Self-pay

## 2018-08-24 DIAGNOSIS — Z79899 Other long term (current) drug therapy: Secondary | ICD-10-CM | POA: Insufficient documentation

## 2018-08-24 DIAGNOSIS — R569 Unspecified convulsions: Secondary | ICD-10-CM | POA: Diagnosis not present

## 2018-08-24 DIAGNOSIS — G40901 Epilepsy, unspecified, not intractable, with status epilepticus: Secondary | ICD-10-CM | POA: Diagnosis not present

## 2018-08-24 DIAGNOSIS — Z7984 Long term (current) use of oral hypoglycemic drugs: Secondary | ICD-10-CM | POA: Insufficient documentation

## 2018-08-24 DIAGNOSIS — F70 Mild intellectual disabilities: Secondary | ICD-10-CM | POA: Diagnosis not present

## 2018-08-24 DIAGNOSIS — Z95818 Presence of other cardiac implants and grafts: Secondary | ICD-10-CM | POA: Insufficient documentation

## 2018-08-24 DIAGNOSIS — I10 Essential (primary) hypertension: Secondary | ICD-10-CM | POA: Insufficient documentation

## 2018-08-24 DIAGNOSIS — E119 Type 2 diabetes mellitus without complications: Secondary | ICD-10-CM | POA: Diagnosis not present

## 2018-08-24 DIAGNOSIS — R404 Transient alteration of awareness: Secondary | ICD-10-CM | POA: Diagnosis not present

## 2018-08-24 DIAGNOSIS — G40909 Epilepsy, unspecified, not intractable, without status epilepticus: Secondary | ICD-10-CM | POA: Diagnosis not present

## 2018-08-24 LAB — URINALYSIS, ROUTINE W REFLEX MICROSCOPIC
Bilirubin Urine: NEGATIVE
Glucose, UA: NEGATIVE mg/dL
Ketones, ur: NEGATIVE mg/dL
Leukocytes, UA: NEGATIVE
Nitrite: NEGATIVE
PROTEIN: 30 mg/dL — AB
SPECIFIC GRAVITY, URINE: 1.013 (ref 1.005–1.030)
pH: 5 (ref 5.0–8.0)

## 2018-08-24 LAB — BASIC METABOLIC PANEL
Anion gap: 8 (ref 5–15)
BUN: 21 mg/dL — AB (ref 6–20)
CHLORIDE: 105 mmol/L (ref 98–111)
CO2: 28 mmol/L (ref 22–32)
Calcium: 8.6 mg/dL — ABNORMAL LOW (ref 8.9–10.3)
Creatinine, Ser: 1.21 mg/dL — ABNORMAL HIGH (ref 0.44–1.00)
GFR calc Af Amer: 60 mL/min — ABNORMAL LOW (ref >60)
GFR calc non Af Amer: 52 mL/min — ABNORMAL LOW (ref >60)
Glucose, Bld: 130 mg/dL — ABNORMAL HIGH (ref 70–99)
POTASSIUM: 3.6 mmol/L (ref 3.5–5.1)
SODIUM: 141 mmol/L (ref 135–145)

## 2018-08-24 LAB — CBC WITH DIFFERENTIAL/PLATELET
Basophils Absolute: 0 10*3/uL (ref 0.0–0.1)
Basophils Relative: 0 %
Eosinophils Absolute: 0.2 10*3/uL (ref 0.0–0.7)
Eosinophils Relative: 3 %
HCT: 33.6 % — ABNORMAL LOW (ref 36.0–46.0)
HEMOGLOBIN: 10.9 g/dL — AB (ref 12.0–15.0)
LYMPHS ABS: 2.2 10*3/uL (ref 0.7–4.0)
LYMPHS PCT: 30 %
MCH: 30.7 pg (ref 26.0–34.0)
MCHC: 32.4 g/dL (ref 30.0–36.0)
MCV: 94.6 fL (ref 78.0–100.0)
MONOS PCT: 4 %
Monocytes Absolute: 0.3 10*3/uL (ref 0.1–1.0)
Neutro Abs: 4.6 10*3/uL (ref 1.7–7.7)
Neutrophils Relative %: 63 %
PLATELETS: 309 10*3/uL (ref 150–400)
RBC: 3.55 MIL/uL — AB (ref 3.87–5.11)
RDW: 13.5 % (ref 11.5–15.5)
WBC: 7.2 10*3/uL (ref 4.0–10.5)

## 2018-08-24 MED ORDER — NITROFURANTOIN MONOHYD MACRO 100 MG PO CAPS: 100.0000 mg | ORAL_CAPSULE | Freq: Two times a day (BID) | ORAL | 0 refills | Status: DC

## 2018-08-24 MED ORDER — LACOSAMIDE 50 MG PO TABS: 50.0000 mg | ORAL_TABLET | Freq: Two times a day (BID) | ORAL | 0 refills | Status: DC

## 2018-08-24 NOTE — Discharge Instructions (Signed)
Please return for any problem.  Follow-up with your regular doctor as instructed.  Continue to take Vimpat as instructed.  Follow-up with neurology as instructed.

## 2018-08-24 NOTE — ED Triage Notes (Signed)
Per EMS, pt had two witnessed seizures this morning. Pt has hx of seizures. Pt stopped keppra 2 months ago, was then put on vimpat. Pt does not recall the last time she had a seizure prior to today. Pt was post ictal upon arrival. Pt denies injury or incontinence. According to husband, the first seizure lasted 20 seconds, the 2nd seizure lasted 1 minute.  BP 123/74 HR 70 RR 16 O2 100% on RA CBG 127

## 2018-08-24 NOTE — ED Notes (Signed)
Bed: WA02 Expected date:  Expected time:  Means of arrival:  Comments: 47 yo f seizure

## 2018-08-24 NOTE — ED Provider Notes (Signed)
[facility] DEPT Provider Note   CSN: [id number] Arrival date & time: 08/24/18  0857     History   Chief Complaint Chief Complaint  Patient presents with  . Seizures    HPI Brandi Gomez is a 47 y.o. female.  47 year old female with prior medical history as detailed below presents with complaint of possible seizure activity.  Patient reports her last seizure was several months previously.  She reports compliance with her Vimpat.  Her last dose of Vimpat was taken yesterday afternoon..  The history is provided by the patient and medical records.  Seizures   This is a recurrent problem. The current episode started 3 to 5 hours ago. The problem has been resolved. There were 2 to 3 seizures. The most recent episode lasted less than 30 seconds. The episode was not witnessed. There was no sensation of an aura present. The seizures did not continue in the ED. The seizure(s) had no focality. There has been no fever.    Past Medical History:  Diagnosis Date  . Anemia   . Anxiety   . Bipolar 1 disorder ([place])   . Common migraine 05/19/2015  . Depression   . Diabetes mellitus, type II ([place])   . Hypertension   . Irritable bowel syndrome (IBS)   . Mild mental retardation   . Obesity   . Partial complex seizure disorder with intractable epilepsy ([place]) 05/12/2014  . Seizures ([place])    intractable, sz 08/23/17  . Sleep apnea   . Stroke ([place])   . Type II or unspecified type diabetes mellitus without mention of complication, not stated as uncontrolled     Patient Active Problem List   Diagnosis Date Noted  . Hallucinations, unspecified 04/15/2018  . Vaginal odor 02/10/2018  . Fungal skin infection 02/10/2018  . Malodorous urine 10/08/2017  . Lower urinary tract symptoms (LUTS) 10/08/2017  . Acute UTI 10/08/2017  . Constipation 10/08/2017  . History of recent stroke 08/06/2017  . Angioedema 06/03/2017  . OSA (obstructive sleep apnea)   . Cryptogenic  stroke ([place]) 05/20/2017  . Controlled type 2 diabetes mellitus without complication, without long-term current use of insulin ([place]) 05/08/2017  . Tiredness 05/13/2014  . Essential hypertension 07/27/2008  . Convulsions ([place]) 02/05/2007    Past Surgical History:  Procedure Laterality Date  . COLONOSCOPY     2012-normal , Dr Sharlett Iles  . ESOPHAGOGASTRODUODENOSCOPY     normal-Dr Patterson 2012  . LOOP RECORDER INSERTION N/A 05/30/2017   Procedure: Loop Recorder Insertion;  Surgeon: Constance Haw, MD;  Location: [facility];  Service: Cardiovascular;  Laterality: N/A;  . LOOP RECORDER REMOVAL N/A 03/04/2018   Procedure: LOOP RECORDER REMOVAL;  Surgeon: Constance Haw, MD;  Location: [facility];  Service: Cardiovascular;  Laterality: N/A;  . MYRINGOTOMY WITH TUBE PLACEMENT    . MYRINGOTOMY WITH TUBE PLACEMENT Right 11/05/2017   Procedure: MYRINGOTOMY WITH TUBE PLACEMENT;  Surgeon: Melissa Montane, MD;  Location: [facility];  Service: ENT;  Laterality: Right;  right T Tube placement  . NASAL SINUS SURGERY    . TEE WITHOUT CARDIOVERSION N/A 05/30/2017   Procedure: TRANSESOPHAGEAL ECHOCARDIOGRAM (TEE);  Surgeon: Acie Fredrickson Wonda Cheng, MD;  Location: [facility] ENDOSCOPY;  Service: Cardiovascular;  Laterality: N/A;     OB History   None      Home Medications    Prior to Admission medications   Medication Sig Start Date End Date Taking? Authorizing Provider  acetaminophen (TYLENOL) 500 MG  tablet Take 500 mg by mouth every 6 (six) hours as needed for headache.   Yes [provider]  atorvastatin (LIPITOR) 10 MG tablet Take 1 tablet (10 mg total) by mouth daily at 6 PM. 12/23/17  Yes Sagardia, Ines Bloomer, MD  carvedilol (COREG) 3.125 MG tablet Take 3.125 mg by mouth daily. 08/20/17  Yes [provider]  DULoxetine (CYMBALTA) 30 MG capsule Take 1 capsule (30 mg total) by mouth daily. 08/06/17  Yes Sagardia, Ines Bloomer, MD  JANUVIA 50 MG tablet TAKE 1 TABLET BY MOUTH  EVERY DAY FOR CONTROL OF BLOOD SUGAR Patient taking differently: Take 50 mg by mouth daily.  07/20/18  Yes Sagardia, Ines Bloomer, MD  lacosamide (VIMPAT) 50 MG TABS tablet One tablet twice a day for 2 weeks, then take 2 tablets twice a day Patient taking differently: Take 50 mg by mouth 2 (two) times daily.  05/18/18  Yes [name], MD  torsemide (DEMADEX) 20 MG tablet Take 1 tablet (20 mg total) by mouth daily. 05/08/17  Yes Horald Pollen, MD  levETIRAcetam (KEPPRA) 500 MG tablet TAKE 1 TABLET BY MOUTH TWICE A DAY Patient not taking: Reported on 08/24/2018 06/15/18   [name], MD    Family History Family History  Problem Relation Age of Onset  . Diabetes Mother        passed away from accidental death  . Hypertension Mother   . Bipolar disorder Father   . Diabetes Daughter   . Leukemia Daughter   . Ovarian cancer Maternal Aunt     Social History Social History   Tobacco Use  . Smoking status: Never Smoker  . Smokeless tobacco: Never Used  Substance Use Topics  . Alcohol use: No  . Drug use: No     Allergies   Amoxicillin; Lisinopril; and Hydrocodone   Review of Systems Review of Systems  Neurological: Positive for seizures.  All other systems reviewed and are negative.    Physical Exam Updated Vital Signs BP 127/85 (BP Location: Right Arm)   Pulse 73   Temp 98.8 F (37.1 C) (Oral)   Resp 18   Ht 5\' 1"  (1.549 m)   Wt 108.9 kg   SpO2 100%   BMI 45.35 kg/m   Physical Exam  Constitutional: She is oriented to person, place, and time. She appears well-developed and well-nourished. No distress.  HENT:  Head: Normocephalic and atraumatic.  Mouth/Throat: Oropharynx is clear and moist.  Eyes: Pupils are equal, round, and reactive to light. Conjunctivae and EOM are normal.  Neck: Normal range of motion. Neck supple.  Cardiovascular: Normal rate, regular rhythm and normal heart sounds.  Pulmonary/Chest: Effort normal and breath sounds normal.  No respiratory distress.  Abdominal: Soft. She exhibits no distension. There is no tenderness.  Musculoskeletal: Normal range of motion. She exhibits no edema or deformity.  Neurological: She is alert and oriented to person, place, and time.  Skin: Skin is warm and dry.  Psychiatric: She has a normal mood and affect.  Nursing note and vitals reviewed.    ED Treatments / Results  Labs (all labs ordered are listed, but only abnormal results are displayed) Labs Reviewed  BASIC METABOLIC PANEL - Abnormal; Notable for the following components:      Result Value   Glucose, Bld 130 (*)    BUN 21 (*)    Creatinine, Ser 1.21 (*)    Calcium 8.6 (*)    GFR calc non Af Amer 52 (*)  GFR calc Af Amer 60 (*)    All other components within normal limits  CBC WITH DIFFERENTIAL/PLATELET - Abnormal; Notable for the following components:   RBC 3.55 (*)    Hemoglobin 10.9 (*)    HCT 33.6 (*)    All other components within normal limits  URINALYSIS, ROUTINE W REFLEX MICROSCOPIC - Abnormal; Notable for the following components:   Color, Urine RED (*)    APPearance HAZY (*)    Hgb urine dipstick LARGE (*)    Protein, ur 30 (*)    RBC / HPF >50 (*)    Bacteria, UA RARE (*)    All other components within normal limits    EKG None  Radiology No results found.  Procedures Procedures (including critical care time)  Medications Ordered in ED Medications - No data to display   Initial Impression / Assessment and Plan / ED Course  I have reviewed the triage vital signs and the nursing notes.  Pertinent labs & imaging results that were available during my care of the patient were reviewed by me and considered in my medical decision making (see chart for details).     MDM  Screen complete  Patient is presenting for evaluation following possible breakthrough seizure activity.  Patient reports compliance with her Vimpat.  Per chart review, patient has history of noncompliance with same.   Screening labs do not reveal significant acute pathology.  Her urine sample appears to be somewhat contaminated. Will treat for possible UTI (as a possible trigger for her reported seizure activity).  Patient appears improved following ED workup and observation.   She is stable for discharge home.   Strict return precautions given and understood.  Importance of close follow-up was stressed. Final Clinical Impressions(s) / ED Diagnoses   Final diagnoses:  Seizure [facility])    ED Discharge Orders         Ordered    nitrofurantoin, macrocrystal-monohydrate, (MACROBID) 100 MG capsule  2 times daily     08/24/18 1113    lacosamide (VIMPAT) 50 MG TABS tablet  2 times daily     08/24/18 1113           Valarie Merino, MD 08/24/18 1118

## 2018-08-24 NOTE — ED Notes (Signed)
Patient ambulated to restroom to provide urine sample

## 2018-09-11 ENCOUNTER — Encounter (HOSPITAL_COMMUNITY): Payer: Self-pay | Admitting: Psychiatry

## 2018-09-11 ENCOUNTER — Ambulatory Visit (INDEPENDENT_AMBULATORY_CARE_PROVIDER_SITE_OTHER): Payer: Medicare HMO | Admitting: Psychiatry

## 2018-09-11 ENCOUNTER — Other Ambulatory Visit: Payer: Self-pay | Admitting: Emergency Medicine

## 2018-09-11 VITALS — BP 136/80 | HR 75 | Ht 61.0 in | Wt 244.0 lb

## 2018-09-11 DIAGNOSIS — F319 Bipolar disorder, unspecified: Secondary | ICD-10-CM | POA: Diagnosis not present

## 2018-09-11 MED ORDER — DULOXETINE HCL 30 MG PO CPEP: 30.0000 mg | ORAL_CAPSULE | Freq: Every day | ORAL | 3 refills | Status: DC
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
## 2018-09-12 ENCOUNTER — Ambulatory Visit (HOSPITAL_COMMUNITY): Payer: Self-pay | Admitting: Psychiatry

## 2018-09-24 ENCOUNTER — Encounter (HOSPITAL_COMMUNITY): Payer: Self-pay | Admitting: Emergency Medicine

## 2018-09-24 ENCOUNTER — Other Ambulatory Visit: Payer: Self-pay

## 2018-09-24 ENCOUNTER — Emergency Department (HOSPITAL_COMMUNITY)
Admission: EM | Admit: 2018-09-24 | Discharge: 2018-09-25 | Disposition: A | Discharge status: Home / Self Care | Payer: Medicare HMO | Attending: Emergency Medicine | Admitting: Emergency Medicine

## 2018-09-24 DIAGNOSIS — R569 Unspecified convulsions: Secondary | ICD-10-CM | POA: Diagnosis not present

## 2018-09-24 DIAGNOSIS — I10 Essential (primary) hypertension: Secondary | ICD-10-CM | POA: Insufficient documentation

## 2018-09-24 DIAGNOSIS — F319 Bipolar disorder, unspecified: Secondary | ICD-10-CM | POA: Insufficient documentation

## 2018-09-24 DIAGNOSIS — Z79899 Other long term (current) drug therapy: Secondary | ICD-10-CM | POA: Diagnosis not present

## 2018-09-24 DIAGNOSIS — G40909 Epilepsy, unspecified, not intractable, without status epilepticus: Secondary | ICD-10-CM | POA: Insufficient documentation

## 2018-09-24 DIAGNOSIS — F79 Unspecified intellectual disabilities: Secondary | ICD-10-CM | POA: Diagnosis not present

## 2018-09-24 DIAGNOSIS — E119 Type 2 diabetes mellitus without complications: Secondary | ICD-10-CM | POA: Diagnosis not present

## 2018-09-24 NOTE — ED Notes (Signed)
Bed: WA02 Expected date:  Expected time:  Means of arrival:  Comments: 26 F seizure hx of same

## 2018-09-24 NOTE — ED Triage Notes (Addendum)
Pt arriving via [facility] for seizure activity. Pt not postictal upon arrival. A&O x4. Denies pain. Has hx of seizures, last one was a couple months ago.

## 2018-09-25 DIAGNOSIS — R569 Unspecified convulsions: Secondary | ICD-10-CM | POA: Diagnosis not present

## 2018-09-25 LAB — CBC WITH DIFFERENTIAL/PLATELET
ABS IMMATURE GRANULOCYTES: 0.07 10*3/uL (ref 0.00–0.07)
BASOS ABS: 0 10*3/uL (ref 0.0–0.1)
Basophils Relative: 0 %
Eosinophils Absolute: 0.1 10*3/uL (ref 0.0–0.5)
Eosinophils Relative: 2 %
HCT: 34.2 % — ABNORMAL LOW (ref 36.0–46.0)
HEMOGLOBIN: 10.6 g/dL — AB (ref 12.0–15.0)
Immature Granulocytes: 1 %
LYMPHS ABS: 2.7 10*3/uL (ref 0.7–4.0)
LYMPHS PCT: 33 %
MCH: 29.9 pg (ref 26.0–34.0)
MCHC: 31 g/dL (ref 30.0–36.0)
MCV: 96.3 fL (ref 80.0–100.0)
MONO ABS: 0.4 10*3/uL (ref 0.1–1.0)
Monocytes Relative: 5 %
NEUTROS PCT: 59 %
NRBC: 0 % (ref 0.0–0.2)
Neutro Abs: 4.9 10*3/uL (ref 1.7–7.7)
Platelets: 286 10*3/uL (ref 150–400)
RBC: 3.55 MIL/uL — ABNORMAL LOW (ref 3.87–5.11)
RDW: 12.9 % (ref 11.5–15.5)
WBC: 8.3 10*3/uL (ref 4.0–10.5)

## 2018-09-25 LAB — VALPROIC ACID LEVEL

## 2018-09-25 LAB — BASIC METABOLIC PANEL
Anion gap: 7 (ref 5–15)
BUN: 21 mg/dL — ABNORMAL HIGH (ref 6–20)
CHLORIDE: 107 mmol/L (ref 98–111)
CO2: 28 mmol/L (ref 22–32)
Calcium: 9 mg/dL (ref 8.9–10.3)
Creatinine, Ser: 1.4 mg/dL — ABNORMAL HIGH (ref 0.44–1.00)
GFR calc non Af Amer: 43 mL/min — ABNORMAL LOW (ref >60)
GFR, EST AFRICAN AMERICAN: 50 mL/min — AB (ref >60)
Glucose, Bld: 121 mg/dL — ABNORMAL HIGH (ref 70–99)
POTASSIUM: 4.3 mmol/L (ref 3.5–5.1)
SODIUM: 142 mmol/L (ref 135–145)

## 2018-09-25 MED ORDER — LACOSAMIDE 50 MG PO TABS
100.0000 mg | ORAL_TABLET | Freq: Two times a day (BID) | ORAL | Status: DC
  Administered 2018-09-25: 100 mg via ORAL
  Filled 2018-09-25: qty 2

## 2018-09-25 NOTE — Discharge Instructions (Signed)
We saw in the ER after you had a seizure. Please increase your Vimpat dose to 100 mg twice a day. Please follow-up with neurologist as soon as possible.  [place] law prevents people with seizures or fainting from driving or operating dangerous machinery until they are free of seizures or fainting for 6 months.

## 2018-09-25 NOTE — ED Notes (Signed)
ED Provider at bedside. 

## 2018-09-25 NOTE — ED Provider Notes (Addendum)
[facility] DEPT Provider Note   CSN: [id number] Arrival date & time: 09/24/18  2331     History   Chief Complaint Chief Complaint  Patient presents with  . Seizures    HPI Brandi Gomez is a 47 y.o. female.  HPI 47 year old female comes in with chief complaint of seizure.  Patient is accompanied by her husband, who reports that prior to ED arrival patient had a witnessed seizure-like activity.  Patient has known history of seizure disorder and is undergoing change in her medications.  Her last seizure was a month ago.  Patient states that she stopped taking Keppra and she was started on Vimpat 50 mg twice a day which she has been taking as prescribed.  She denies any recent infections or increased stress.  Her last seizure was last month, which she was told was because of a UTI.  Past Medical History:  Diagnosis Date  . Anemia   . Anxiety   . Bipolar 1 disorder ([place])   . Common migraine 05/19/2015  . Depression   . Diabetes mellitus, type II ([place])   . Hypertension   . Irritable bowel syndrome (IBS)   . Mild mental retardation   . Obesity   . Partial complex seizure disorder with intractable epilepsy ([place]) 05/12/2014  . Seizures ([place])    intractable, sz 08/23/17  . Sleep apnea   . Stroke ([place])   . Type II or unspecified type diabetes mellitus without mention of complication, not stated as uncontrolled     Patient Active Problem List   Diagnosis Date Noted  . Hallucinations, unspecified 04/15/2018  . Vaginal odor 02/10/2018  . Fungal skin infection 02/10/2018  . Malodorous urine 10/08/2017  . Lower urinary tract symptoms (LUTS) 10/08/2017  . Acute UTI 10/08/2017  . Constipation 10/08/2017  . History of recent stroke 08/06/2017  . Angioedema 06/03/2017  . OSA (obstructive sleep apnea)   . Cryptogenic stroke ([place]) 05/20/2017  . Controlled type 2 diabetes mellitus without complication, without long-term current use of insulin ([place])  05/08/2017  . Tiredness 05/13/2014  . Essential hypertension 07/27/2008  . Convulsions ([place]) 02/05/2007    Past Surgical History:  Procedure Laterality Date  . COLONOSCOPY     2012-normal , Dr Sharlett Iles  . ESOPHAGOGASTRODUODENOSCOPY     normal-Dr Patterson 2012  . LOOP RECORDER INSERTION N/A 05/30/2017   Procedure: Loop Recorder Insertion;  Surgeon: Constance Haw, MD;  Location: [facility];  Service: Cardiovascular;  Laterality: N/A;  . LOOP RECORDER REMOVAL N/A 03/04/2018   Procedure: LOOP RECORDER REMOVAL;  Surgeon: Constance Haw, MD;  Location: [facility];  Service: Cardiovascular;  Laterality: N/A;  . MYRINGOTOMY WITH TUBE PLACEMENT    . MYRINGOTOMY WITH TUBE PLACEMENT Right 11/05/2017   Procedure: MYRINGOTOMY WITH TUBE PLACEMENT;  Surgeon: Melissa Montane, MD;  Location: [facility];  Service: ENT;  Laterality: Right;  right T Tube placement  . NASAL SINUS SURGERY    . TEE WITHOUT CARDIOVERSION N/A 05/30/2017   Procedure: TRANSESOPHAGEAL ECHOCARDIOGRAM (TEE);  Surgeon: Acie Fredrickson Wonda Cheng, MD;  Location: [facility] ENDOSCOPY;  Service: Cardiovascular;  Laterality: N/A;     OB History   None      Home Medications    Prior to Admission medications   Medication Sig Start Date End Date Taking? Authorizing Provider  acetaminophen (TYLENOL) 500 MG tablet Take 500 mg by mouth every 6 (six) hours as needed for headache.   Yes [provider]  atorvastatin (LIPITOR) 10 MG tablet Take 1 tablet (10 mg total) by mouth daily at 6 PM. 12/23/17  Yes Sagardia, Ines Bloomer, MD  carvedilol (COREG) 3.125 MG tablet Take 3.125 mg by mouth daily. 08/20/17  Yes [provider]  DULoxetine (CYMBALTA) 30 MG capsule TAKE ONE CAPSULE BY MOUTH EVERY DAY 09/11/18  Yes Sagardia, [place], MD  JANUVIA 50 MG tablet TAKE 1 TABLET BY MOUTH EVERY DAY FOR CONTROL OF BLOOD SUGAR Patient taking differently: Take 50 mg by mouth daily.  07/20/18  Yes Sagardia, Ines Bloomer, MD  lacosamide  (VIMPAT) 50 MG TABS tablet One tablet twice a day for 2 weeks, then take 2 tablets twice a day Patient taking differently: Take 50 mg by mouth 2 (two) times daily.  05/18/18  Yes [name], MD  torsemide (DEMADEX) 20 MG tablet Take 1 tablet (20 mg total) by mouth daily. 05/08/17  Yes Horald Pollen, MD  levETIRAcetam (KEPPRA) 500 MG tablet TAKE 1 TABLET BY MOUTH TWICE A DAY Patient not taking: Reported on 09/11/2018 06/15/18   [name], MD    Family History Family History  Problem Relation Age of Onset  . Diabetes Mother        passed away from accidental death  . Hypertension Mother   . Bipolar disorder Father   . Diabetes Daughter   . Leukemia Daughter   . Ovarian cancer Maternal Aunt     Social History Social History   Tobacco Use  . Smoking status: Never Smoker  . Smokeless tobacco: Never Used  Substance Use Topics  . Alcohol use: No  . Drug use: No     Allergies   Amoxicillin; Lisinopril; and Hydrocodone   Review of Systems Review of Systems  Constitutional: Positive for activity change. Negative for fever.  Gastrointestinal: Negative for nausea and vomiting.  Allergic/Immunologic: Negative for immunocompromised state.  Neurological: Positive for seizures. Negative for headaches.  Hematological: Does not bruise/bleed easily.  All other systems reviewed and are negative.    Physical Exam Updated Vital Signs BP 107/63   Pulse 69   Resp (!) 21   Ht 5\' 1"  (1.549 m)   Wt 102.1 kg   SpO2 95%   BMI 42.51 kg/m   Physical Exam  Constitutional: She is oriented to person, place, and time. She appears well-developed.  HENT:  Head: Normocephalic and atraumatic.  Eyes: EOM are normal.  Neck: Normal range of motion. Neck supple.  Cardiovascular: Normal rate.  Pulmonary/Chest: Effort normal.  Abdominal: Bowel sounds are normal.  Neurological: She is alert and oriented to person, place, and time. No cranial nerve deficit. Coordination normal.    Skin: Skin is warm and dry.  Nursing note and vitals reviewed.    ED Treatments / Results  Labs (all labs ordered are listed, but only abnormal results are displayed) Labs Reviewed  CBC WITH DIFFERENTIAL/PLATELET - Abnormal; Notable for the following components:      Result Value   RBC 3.55 (*)    Hemoglobin 10.6 (*)    HCT 34.2 (*)    All other components within normal limits  BASIC METABOLIC PANEL - Abnormal; Notable for the following components:   Glucose, Bld 121 (*)    BUN 21 (*)    Creatinine, Ser 1.40 (*)    GFR calc non Af Amer 43 (*)    GFR calc Af Amer 50 (*)    All other components within normal limits  VALPROIC ACID LEVEL - Abnormal; Notable  for the following components:   Valproic Acid Lvl <10 (*)    All other components within normal limits    EKG None  Radiology No results found.  Procedures Procedures (including critical care time)  Medications Ordered in ED Medications  lacosamide (VIMPAT) tablet 100 mg (100 mg Oral Given 09/25/18 0121)     Initial Impression / Assessment and Plan / ED Course  I have reviewed the triage vital signs and the nursing notes.  Pertinent labs & imaging results that were available during my care of the patient were reviewed by me and considered in my medical decision making (see chart for details).  Clinical Course as of Sep 25 301  Fri Sep 25, 2018  0301 Continues to be seizure-free.  We will discharge her.  Labs reviewed and are reassuring.   [AN]    Clinical Course User Index [AN] Varney Biles, MD    Patient with history of seizure disorder comes in after having a seizure.  History is not suggestive of any underlying etiology.  Patient reports that she has been taking her Vimpat as prescribed.  However, when I went to check her neurology notes it seems like patient never increased her Vimpat 200 mg twice daily as recommended.  I informed patient that if she has discontinued Keppra and not increase her  Vimpat, then that could be the underlying cause.  She will follow-up with neurology.  In the interim she will start taking 100 mg Vimpat as recommended by Dr. Jannifer Franklin.  Final Clinical Impressions(s) / ED Diagnoses   Final diagnoses:  Seizure [facility])  Seizure disorder [facility])    ED Discharge Orders    None       Varney Biles, MD 09/25/18 0352    Varney Biles, MD 09/25/18 4818

## 2018-10-21 ENCOUNTER — Encounter: Payer: Self-pay | Admitting: Adult Health

## 2018-10-21 ENCOUNTER — Ambulatory Visit (INDEPENDENT_AMBULATORY_CARE_PROVIDER_SITE_OTHER): Payer: Medicare HMO | Admitting: Adult Health

## 2018-10-21 VITALS — BP 125/82 | HR 86 | Ht 59.5 in | Wt 241.0 lb

## 2018-10-21 DIAGNOSIS — R569 Unspecified convulsions: Secondary | ICD-10-CM | POA: Diagnosis not present

## 2018-10-21 MED ORDER — LACOSAMIDE 100 MG PO TABS: 100.0000 mg | ORAL_TABLET | Freq: Two times a day (BID) | ORAL | 3 refills | Status: DC

## 2018-10-21 NOTE — Progress Notes (Signed)
I have read the note, and I agree with the clinical assessment and plan.  Charles K Willis   

## 2018-10-21 NOTE — Progress Notes (Signed)
PATIENT: Brandi Gomez DOB: [birth year]  REASON FOR VISIT: follow up Brandi FROM: patient  Brandi OF PRESENT ILLNESS: Today 10/21/18:  Brandi Gomez is a 47 year old female with a Brandi of seizures.  And bipolar disorder.  She returns today for follow-up.  She had a seizure back in October.  It appears that the patient stopped Keppra abruptly and did not increase her dose of Vimpat as instructed.  While in the emergency room they increased Vimpat to 100 mg twice a day.  She reports that since October she has not had any additional seizure events.  She does not operate a motor vehicle.  Reports that she is tolerating the medication well.  She is able to complete all ADLs independently.  She returns today for evaluation.  Brandi Gomez is a 47 year old right-handed black female with a Brandi of bipolar disorder and Brandi of seizures.  The patient also has a Brandi of cerebrovascular disease.  She has been in the emergency room several times in early mid May, twice for seizures, once for psychosis.  The patient is not taking her medications properly, she feels that the Dilantin is causing hallucinations.  When she goes to bed at night she has trouble with her mind racing.  She remains on Keppra taking 500 mg twice daily but this is the only seizure medication she is on currently.  The patient comes in today with a friend/caretaker.  The patient has gone off of other medications as well, she will not take her Risperdal as she believes this makes her feel bad.   REVIEW OF SYSTEMS: Out of a complete 14 system review of symptoms, the patient complains only of the following symptoms, and all other reviewed systems are negative.  See HPI  ALLERGIES: Allergies  Allergen Reactions  . Amoxicillin Itching    Has patient had a PCN reaction causing immediate rash, facial/tongue/throat swelling, SOB or lightheadedness with hypotension: yes Has patient had a PCN reaction causing severe rash  involving mucus membranes or skin necrosis: no Has patient had a PCN reaction that required hospitalization: no Has patient had a PCN reaction occurring within the last 10 years: yes If all of the above answers are "NO", then may proceed with Cephalosporin use.  Marland Kitchen Lisinopril Swelling    Angioedema   . Hydrocodone Other (See Comments)    Depressed     HOME MEDICATIONS: Outpatient Medications Prior to Visit  Medication Sig Dispense Refill  . atorvastatin (LIPITOR) 10 MG tablet Take 1 tablet (10 mg total) by mouth daily at 6 PM. 90 tablet 3  . carvedilol (COREG) 3.125 MG tablet Take 3.125 mg by mouth daily.    . DULoxetine (CYMBALTA) 30 MG capsule TAKE ONE CAPSULE BY MOUTH EVERY DAY 90 capsule 0  . JANUVIA 50 MG tablet TAKE 1 TABLET BY MOUTH EVERY DAY FOR CONTROL OF BLOOD SUGAR (Patient taking differently: Take 50 mg by mouth daily. ) 90 tablet 1  . lacosamide (VIMPAT) 50 MG TABS tablet One tablet twice a day for 2 weeks, then take 2 tablets twice a day (Patient taking differently: Take 50 mg by mouth 2 (two) times daily. ) 120 tablet 3  . torsemide (DEMADEX) 20 MG tablet Take 1 tablet (20 mg total) by mouth daily. 90 tablet 1  . acetaminophen (TYLENOL) 500 MG tablet Take 500 mg by mouth every 6 (six) hours as needed for headache.    . levETIRAcetam (KEPPRA) 500 MG tablet TAKE 1 TABLET BY MOUTH TWICE A  DAY 180 tablet 0   No facility-administered medications prior to visit.     PAST MEDICAL Brandi: Past Medical Brandi:  Diagnosis Date  . Anemia   . Anxiety   . Bipolar 1 disorder ([place])   . Common migraine 05/19/2015  . Depression   . Diabetes mellitus, type II ([place])   . Hypertension   . Irritable bowel syndrome (IBS)   . Mild mental retardation   . Obesity   . Partial complex seizure disorder with intractable epilepsy ([place]) 05/12/2014  . Seizures ([place])    intractable, sz 08/23/17  . Sleep apnea   . Stroke ([place])   . Type II or unspecified type diabetes mellitus without mention of  complication, not stated as uncontrolled     PAST SURGICAL Brandi: Past Surgical Brandi:  Procedure Laterality Date  . COLONOSCOPY     2012-normal , Dr Sharlett Iles  . ESOPHAGOGASTRODUODENOSCOPY     normal-Dr Patterson 2012  . LOOP RECORDER INSERTION N/A 05/30/2017   Procedure: Loop Recorder Insertion;  Surgeon: Constance Haw, MD;  Location: [facility];  Service: Cardiovascular;  Laterality: N/A;  . LOOP RECORDER REMOVAL N/A 03/04/2018   Procedure: LOOP RECORDER REMOVAL;  Surgeon: Constance Haw, MD;  Location: [facility];  Service: Cardiovascular;  Laterality: N/A;  . MYRINGOTOMY WITH TUBE PLACEMENT    . MYRINGOTOMY WITH TUBE PLACEMENT Right 11/05/2017   Procedure: MYRINGOTOMY WITH TUBE PLACEMENT;  Surgeon: Melissa Montane, MD;  Location: [facility];  Service: ENT;  Laterality: Right;  right T Tube placement  . NASAL SINUS SURGERY    . TEE WITHOUT CARDIOVERSION N/A 05/30/2017   Procedure: TRANSESOPHAGEAL ECHOCARDIOGRAM (TEE);  Surgeon: Acie Fredrickson Wonda Cheng, MD;  Location[facility] END[facility]COPY;  Service: Cardiovascular;  Laterality: N/A;    FAMILY Brandi: Family Brandi  Problem Relation Age of Onset  . Diabetes Mother        passed away from accidental death  . Hypertension Mother   . Bipolar disorder Father   . Diabetes Daughter   . Leukemia Daughter   . Ovarian cancer Maternal Aunt     SOCIAL Brandi: Social Brandi   Socioeconomic Brandi  . Marital status: Single    Spouse name: [name]  . Number of children: 3  . Years of education: 39  . Highest education level: Not on file  Occupational Brandi  . Occupation: disabled    Employer: DISABLED  Social Needs  . Financial resource strain: Not on file  . Food insecurity:    Worry: Not on file    Inability: Not on file  . Transportation needs:    Medical: Not on file    Non-medical: Not on file  Tobacco Use  . Smoking status: Never Smoker  . Smokeless tobacco: Never Used  Substance and Sexual Activity  .  Alcohol use: No  . Drug use: No  . Sexual activity: Never  Lifestyle  . Physical activity:    Days per week: Not on file    Minutes per session: Not on file  . Stress: Not on file  Relationships  . Social connections:    Talks on phone: Not on file    Gets together: Not on file    Attends religious service: Not on file    Active member of club or organization: Not on file    Attends meetings of clubs or organizations: Not on file    Relationship status: Not on file  . Intimate partner violence:    Fear  of current or ex partner: Not on file    Emotionally abused: Not on file    Physically abused: Not on file    Forced sexual activity: Not on file  Other Topics Concern  . Not on file  Social Brandi Narrative   Patient lives at home with daughter.    Patient has 3 children.    Patient is right handed.    Patient has a high school education.    Patient is on disability   Patient drinks 2 cups of caffeine daily.      PHYSICAL EXAM  Vitals:   10/21/18 1351  BP: 125/82  Pulse: 86  Weight: 241 lb (109.3 kg)  Height: 4' 11.5" (1.511 m)   Body mass index is 47.86 kg/m.  Generalized: Well developed, in no acute distress   Neurological examination  Mentation: Alert oriented to time, place, Brandi taking. Follows all commands speech and language fluent Cranial nerve II-XII: Pupils were equal round reactive to light. Extraocular movements were full, visual field were full on confrontational test. Facial sensation and strength were normal. Uvula tongue midline. Head turning and shoulder shrug  were normal and symmetric. Motor: The motor testing reveals 5 over 5 strength of all 4 extremities. Good symmetric motor tone is noted throughout.  Sensory: Sensory testing is intact to soft touch on all 4 extremities. No evidence of extinction is noted.  Coordination: Cerebellar testing reveals good finger-nose-finger and heel-to-shin bilaterally.  Gait and station: Gait is  normal. Reflexes: Deep tendon reflexes are symmetric and normal bilaterally.   DIAGNOSTIC DATA (LABS, IMAGING, TESTING) - I reviewed patient records, labs, notes, testing and imaging myself where available.  Lab Results  Component Value Date   WBC 8.3 09/25/2018   HGB 10.6 (L) 09/25/2018   HCT 34.2 (L) 09/25/2018   MCV 96.3 09/25/2018   PLT 286 09/25/2018      Component Value Date/Time   NA 142 09/25/2018 0001   NA 137 10/08/2017 1657   K 4.3 09/25/2018 0001   CL 107 09/25/2018 0001   CO2 28 09/25/2018 0001   GLUCOSE 121 (H) 09/25/2018 0001   BUN 21 (H) 09/25/2018 0001   BUN 24 10/08/2017 1657   CREATININE 1.40 (H) 09/25/2018 0001   CREATININE 0.72 01/30/2015 1241   CALCIUM 9.0 09/25/2018 0001   PROT 7.1 04/13/2018 2247   PROT 8.1 10/08/2017 1657   ALBUMIN 3.3 (L) 04/13/2018 2247   ALBUMIN 4.4 10/08/2017 1657   AST 38 04/13/2018 2247   ALT 44 04/13/2018 2247   ALKPHOS 143 (H) 04/13/2018 2247   BILITOT 0.8 04/13/2018 2247   BILITOT <0.2 10/08/2017 1657   GFRNONAA 43 (L) 09/25/2018 0001   GFRNONAA >89 01/30/2015 1241   GFRAA 50 (L) 09/25/2018 0001   GFRAA >89 01/30/2015 1241   Lab Results  Component Value Date   CHOL 158 05/21/2017   HDL 44 05/21/2017   LDLCALC 82 05/21/2017   TRIG 161 (H) 05/21/2017   CHOLHDL 3.6 05/21/2017   Lab Results  Component Value Date   HGBA1C 6.3 (A) 05/12/2018   Lab Results  Component Value Date   VITAMINB12 379 05/13/2014   Lab Results  Component Value Date   TSH 1.220 02/10/2018      ASSESSMENT AND PLAN 47 y.o. year old female  has a past medical Brandi of Anemia, Anxiety, Bipolar 1 disorder ([place]), Common migraine (05/19/2015), Depression, Diabetes mellitus, type II ([place]), Hypertension, Irritable bowel syndrome (IBS), Mild mental retardation, Obesity, Partial complex seizure  disorder with intractable epilepsy ([place]) (05/12/2014), Seizures ([place]), Sleep apnea, Stroke ([place]), and Type II or unspecified type diabetes mellitus  without mention of complication, not stated as uncontrolled. here with:  1.  Seizures  The patient will continue on Vimpat 100 mg twice a day.  I have advised the patient that she should take medication consistently and not skip any doses.  If she has any additional seizure events she should let us know.  She should not operate a motor vehicle until she is seizure-free for 6 months.  She voiced understanding.  She will follow-up in 6 months or sooner if needed.   I spent 15 minutes with the patient. 50% of this time was spent reviewing seizure restrictions.  Ward Givens, MSN, NP-C 10/21/2018, 2:14 PM [facility] [address] [place], [place] [address] [phone]

## 2018-10-21 NOTE — Patient Instructions (Signed)
Your Plan:  Continue Vimpat 100 mg twice twice a day If you have any additional seizures please let us know If your symptoms worsen or you develop new symptoms please let us know.   Thank you for coming to see [place] at [facility]. I hope we have been able to provide you high quality care today.  You may receive a patient satisfaction survey over the next few weeks. We would appreciate your feedback and comments so that we may continue to improve ourselves and the health of our patients.

## 2018-11-03 ENCOUNTER — Other Ambulatory Visit: Payer: Self-pay | Admitting: Neurology

## 2018-11-11 ENCOUNTER — Ambulatory Visit (INDEPENDENT_AMBULATORY_CARE_PROVIDER_SITE_OTHER): Payer: Medicare HMO

## 2018-11-11 ENCOUNTER — Ambulatory Visit (INDEPENDENT_AMBULATORY_CARE_PROVIDER_SITE_OTHER): Payer: Medicare HMO | Admitting: Emergency Medicine

## 2018-11-11 ENCOUNTER — Encounter: Payer: Self-pay | Admitting: Emergency Medicine

## 2018-11-11 ENCOUNTER — Other Ambulatory Visit: Payer: Self-pay

## 2018-11-11 VITALS — BP 142/85 | HR 75 | Temp 98.4°F | Resp 16 | Ht 60.0 in | Wt 244.8 lb

## 2018-11-11 DIAGNOSIS — Z8669 Personal history of other diseases of the nervous system and sense organs: Secondary | ICD-10-CM

## 2018-11-11 DIAGNOSIS — M25552 Pain in left hip: Secondary | ICD-10-CM

## 2018-11-11 DIAGNOSIS — Z6841 Body Mass Index (BMI) 40.0 and over, adult: Secondary | ICD-10-CM | POA: Diagnosis not present

## 2018-11-11 DIAGNOSIS — E119 Type 2 diabetes mellitus without complications: Secondary | ICD-10-CM

## 2018-11-11 DIAGNOSIS — I10 Essential (primary) hypertension: Secondary | ICD-10-CM

## 2018-11-11 LAB — COMPREHENSIVE METABOLIC PANEL
ALBUMIN: 3.7 g/dL (ref 3.5–5.5)
ALK PHOS: 75 IU/L (ref 39–117)
ALT: 13 IU/L (ref 0–32)
AST: 13 IU/L (ref 0–40)
Albumin/Globulin Ratio: 1.3 (ref 1.2–2.2)
BUN / CREAT RATIO: 15 (ref 9–23)
BUN: 17 mg/dL (ref 6–24)
Bilirubin Total: 0.5 mg/dL (ref 0.0–1.2)
CALCIUM: 8.7 mg/dL (ref 8.7–10.2)
CO2: 23 mmol/L (ref 20–29)
CREATININE: 1.15 mg/dL — AB (ref 0.57–1.00)
Chloride: 101 mmol/L (ref 96–106)
GFR calc Af Amer: 65 mL/min/{1.73_m2} (ref >59)
GFR, EST NON AFRICAN AMERICAN: 56 mL/min/{1.73_m2} — AB (ref >59)
GLOBULIN, TOTAL: 2.9 g/dL (ref 1.5–4.5)
Glucose: 99 mg/dL (ref 65–99)
Potassium: 4.3 mmol/L (ref 3.5–5.2)
SODIUM: 139 mmol/L (ref 134–144)
Total Protein: 6.6 g/dL (ref 6.0–8.5)

## 2018-11-11 LAB — CBC WITH DIFFERENTIAL/PLATELET
Basophils Absolute: 0 10*3/uL (ref 0.0–0.2)
Basos: 0 %
EOS (ABSOLUTE): 0.1 10*3/uL (ref 0.0–0.4)
EOS: 1 %
HEMATOCRIT: 29 % — AB (ref 34.0–46.6)
HEMOGLOBIN: 9.5 g/dL — AB (ref 11.1–15.9)
Immature Grans (Abs): 0 10*3/uL (ref 0.0–0.1)
Immature Granulocytes: 0 %
LYMPHS ABS: 2 10*3/uL (ref 0.7–3.1)
Lymphs: 30 %
MCH: 29.6 pg (ref 26.6–33.0)
MCHC: 32.8 g/dL (ref 31.5–35.7)
MCV: 90 fL (ref 79–97)
MONOCYTES: 7 %
MONOS ABS: 0.5 10*3/uL (ref 0.1–0.9)
Neutrophils Absolute: 4.2 10*3/uL (ref 1.4–7.0)
Neutrophils: 62 %
Platelets: 239 10*3/uL (ref 150–450)
RBC: 3.21 x10E6/uL — ABNORMAL LOW (ref 3.77–5.28)
RDW: 13.1 % (ref 12.3–15.4)
WBC: 6.7 10*3/uL (ref 3.4–10.8)

## 2018-11-11 LAB — GLUCOSE, POCT (MANUAL RESULT ENTRY): POC Glucose: 118 mg/dl — AB (ref 70–99)

## 2018-11-11 LAB — POCT GLYCOSYLATED HEMOGLOBIN (HGB A1C): Hemoglobin A1C: 5.8 % — AB (ref 4.0–5.6)

## 2018-11-11 NOTE — Patient Instructions (Addendum)
     If you have lab work done today you will be contacted with your lab results within the next 2 weeks.  If you have not heard from us then please contact us. The fastest way to get your results is to register for My Chart.   IF you received an x-ray today, you will receive an invoice from [facility]. Please contact [facility] at [phone] with questions or concerns regarding your invoice.   IF you received labwork today, you will receive an invoice from [facility]. Please contact [facility] at [phone] with questions or concerns regarding your invoice.   Our billing staff will not be able to assist you with questions regarding bills from these companies.  You will be contacted with the lab results as soon as they are available. The fastest way to get your results is to activate your My Chart account. Instructions are located on the last page of this paperwork. If you have not heard from us regarding the results in 2 weeks, please contact this office.      Hip Pain The hip is the joint between the upper legs and the lower pelvis. The bones, cartilage, tendons, and muscles of your hip joint support your body and allow you to move around. Hip pain can range from a minor ache to severe pain in one or both of your hips. The pain may be felt on the inside of the hip joint near the groin, or the outside near the buttocks and upper thigh. You may also have swelling or stiffness. Follow these instructions at home: Managing pain, stiffness, and swelling  If directed, apply ice to the injured area. ? Put ice in a plastic bag. ? Place a towel between your skin and the bag. ? Leave the ice on for 20 minutes, 2-3 times a day  Sleep with a pillow between your legs on your most comfortable side.  Avoid any activities that cause pain. General instructions  Take over-the-counter and prescription medicines only as told by your health care provider.  Do any exercises as told  by your health care provider.  Record the following: ? How often you have hip pain. ? The location of your pain. ? What the pain feels like. ? What makes the pain worse.  Keep all follow-up visits as told by your health care provider. This is important. Contact a health care provider if:  You cannot put weight on your leg.  Your pain or swelling continues or gets worse after one week.  It gets harder to walk.  You have a fever. Get help right away if:  You fall.  You have a sudden increase in pain and swelling in your hip.  Your hip is red or swollen or very tender to touch. Summary  Hip pain can range from a minor ache to severe pain in one or both of your hips.  The pain may be felt on the inside of the hip joint near the groin, or the outside near the buttocks and upper thigh.  Avoid any activities that cause pain.  Record how often you have hip pain, the location of the pain, what makes it worse and what it feels like. This information is not intended to replace advice given to you by your health care provider. Make sure you discuss any questions you have with your health care provider. Document Released: 05/15/2010 Document Revised: 10/28/2016 Document Reviewed: 10/28/2016 Elsevier Interactive Patient Education  2018 [employer]  

## 2018-11-11 NOTE — Progress Notes (Signed)
Lab Results  Component Value Date   HGBA1C 6.3 (A) 05/12/2018   BP Readings from Last 3 Encounters:  10/21/18 125/82  09/25/18 104/66  08/24/18 123/88   Wt Readings from Last 3 Encounters:  10/21/18 241 lb (109.3 kg)  09/25/18 225 lb (102.1 kg)  08/24/18 240 lb (108.9 kg)   Brandi Gomez 47 y.o.   Chief Complaint  Patient presents with  . Hip Pain    LEFT x 3 days   . Medication Refill    TORSEMIDE    HISTORY OF PRESENT ILLNESS: This is a 47 y.o. female complaining of pain to her left hip for 3 days.  Denies injury. Has a history of diabetes. Also has a history of hypertension. Lab Results  Component Value Date   CREATININE 1.40 (H) 09/25/2018   BUN 21 (H) 09/25/2018   NA 142 09/25/2018   K 4.3 09/25/2018   CL 107 09/25/2018   CO2 28 09/25/2018  Last BMPs showed decreased GFR over the last 2 months.  Patient had been taking torsemide 20 mg daily. Patient not very compliant with medical appointments. Has a history of seizure disorder with a cryptogenic stroke in the past.  HPIIn its most useful clinical sense, the term cryptogenic stroke designates the category of ischemic stroke for which no probable cause is found despite a thorough diagnostic evaluation.      Prior to Admission medications   Medication Sig Start Date End Date Taking? Authorizing Provider  acetaminophen (TYLENOL) 500 MG tablet Take 500 mg by mouth every 6 (six) hours as needed for headache.   Yes [provider]  atorvastatin (LIPITOR) 10 MG tablet Take 1 tablet (10 mg total) by mouth daily at 6 PM. 12/23/17  Yes Ellean Firman, Ines Bloomer, MD  carvedilol (COREG) 3.125 MG tablet Take 3.125 mg by mouth daily. 08/20/17  Yes [provider]  DULoxetine (CYMBALTA) 30 MG capsule TAKE ONE CAPSULE BY MOUTH EVERY DAY 09/11/18  Yes Idamae Coccia, [place], MD  JANUVIA 50 MG tablet TAKE 1 TABLET BY MOUTH EVERY DAY FOR CONTROL OF BLOOD SUGAR Patient taking differently: Take 50 mg by mouth  daily.  07/20/18  Yes Jasira Robinson, Ines Bloomer, MD  Lacosamide 100 MG TABS Take 1 tablet (100 mg total) by mouth 2 (two) times daily. 10/21/18  Yes Ward Givens, NP  torsemide (DEMADEX) 20 MG tablet Take 1 tablet (20 mg total) by mouth daily. 05/08/17  Yes Horald Pollen, MD  levETIRAcetam (KEPPRA) 500 MG tablet TAKE 1 TABLET BY MOUTH TWICE A DAY Patient not taking: Reported on 11/11/2018 06/15/18   [name], MD    Allergies  Allergen Reactions  . Amoxicillin Itching    Has patient had a PCN reaction causing immediate rash, facial/tongue/throat swelling, SOB or lightheadedness with hypotension: yes Has patient had a PCN reaction causing severe rash involving mucus membranes or skin necrosis: no Has patient had a PCN reaction that required hospitalization: no Has patient had a PCN reaction occurring within the last 10 years: yes If all of the above answers are "NO", then may proceed with Cephalosporin use.  Marland Kitchen Lisinopril Swelling    Angioedema   . Hydrocodone Other (See Comments)    Depressed     Patient Active Problem List   Diagnosis Date Noted  . History of recent stroke 08/06/2017  . OSA (obstructive sleep apnea)   . Cryptogenic stroke ([place]) 05/20/2017  . Controlled type 2 diabetes mellitus without complication, without long-term current use of insulin (  [place]) 05/08/2017  . Essential hypertension 07/27/2008  . Convulsions ([place]) 02/05/2007    Past Medical History:  Diagnosis Date  . Anemia   . Anxiety   . Bipolar 1 disorder ([place])   . Common migraine 05/19/2015  . Depression   . Diabetes mellitus, type II ([place])   . Hypertension   . Irritable bowel syndrome (IBS)   . Mild mental retardation   . Obesity   . Partial complex seizure disorder with intractable epilepsy ([place]) 05/12/2014  . Seizures ([place])    intractable, sz 08/23/17  . Sleep apnea   . Stroke ([place])   . Type II or unspecified type diabetes mellitus without mention of complication, not stated as uncontrolled      Past Surgical History:  Procedure Laterality Date  . COLONOSCOPY     2012-normal , Dr Sharlett Iles  . ESOPHAGOGASTRODUODENOSCOPY     normal-Dr Patterson 2012  . LOOP RECORDER INSERTION N/A 05/30/2017   Procedure: Loop Recorder Insertion;  Surgeon: Constance Haw, MD;  Location: [facility];  Service: Cardiovascular;  Laterality: N/A;  . LOOP RECORDER REMOVAL N/A 03/04/2018   Procedure: LOOP RECORDER REMOVAL;  Surgeon: Constance Haw, MD;  Location: [facility];  Service: Cardiovascular;  Laterality: N/A;  . MYRINGOTOMY WITH TUBE PLACEMENT    . MYRINGOTOMY WITH TUBE PLACEMENT Right 11/05/2017   Procedure: MYRINGOTOMY WITH TUBE PLACEMENT;  Surgeon: Melissa Montane, MD;  Location: [facility];  Service: ENT;  Laterality: Right;  right T Tube placement  . NASAL SINUS SURGERY    . TEE WITHOUT CARDIOVERSION N/A 05/30/2017   Procedure: TRANSESOPHAGEAL ECHOCARDIOGRAM (TEE);  Surgeon: Acie Fredrickson Wonda Cheng, MD;  Location[facility];  Service: Cardiovascular;  Laterality: N/A;    Social History   Socioeconomic History  . Marital status: Single    Spouse name: [name]  . Number of children: 3  . Years of education: 64  . Highest education level: Not on file  Occupational History  . Occupation: disabled    Employer: DISABLED  Social Needs  . Financial resource strain: Not on file  . Food insecurity:    Worry: Not on file    Inability: Not on file  . Transportation needs:    Medical: Not on file    Non-medical: Not on file  Tobacco Use  . Smoking status: Never Smoker  . Smokeless tobacco: Never Used  Substance and Sexual Activity  . Alcohol use: No  . Drug use: No  . Sexual activity: Never  Lifestyle  . Physical activity:    Days per week: Not on file    Minutes per session: Not on file  . Stress: Not on file  Relationships  . Social connections:    Talks on phone: Not on file    Gets together: Not on file    Attends religious service: Not on file    Active  member of club or organization: Not on file    Attends meetings of clubs or organizations: Not on file    Relationship status: Not on file  . Intimate partner violence:    Fear of current or ex partner: Not on file    Emotionally abused: Not on file    Physically abused: Not on file    Forced sexual activity: Not on file  Other Topics Concern  . Not on file  Social History Narrative   Patient lives at home with daughter.    Patient has 3 children.    Patient is  right handed.    Patient has a high school education.    Patient is on disability   Patient drinks 2 cups of caffeine daily.    Family History  Problem Relation Age of Onset  . Diabetes Mother        passed away from accidental death  . Hypertension Mother   . Bipolar disorder Father   . Diabetes Daughter   . Leukemia Daughter   . Ovarian cancer Maternal Aunt      Review of Systems  Constitutional: Negative.  Negative for chills and fever.  HENT: Negative.  Negative for hearing loss and sore throat.   Eyes: Negative.  Negative for blurred vision and double vision.  Respiratory: Negative.  Negative for cough and shortness of breath.   Cardiovascular: Negative.  Negative for chest pain and palpitations.  Gastrointestinal: Negative.  Negative for abdominal pain, blood in stool, diarrhea, melena, nausea and vomiting.  Genitourinary: Negative.  Negative for dysuria and hematuria.  Musculoskeletal: Positive for joint pain (Left hip pain).  Skin: Negative.  Negative for rash.  Neurological: Negative.  Negative for dizziness and headaches.  Endo/Heme/Allergies: Negative.   All other systems reviewed and are negative.   Vitals:   11/11/18 1022  BP: (!) 142/85  Pulse: 75  Resp: 16  Temp: 98.4 F (36.9 C)  SpO2: 100%    Physical Exam  Constitutional: She is oriented to person, place, and time. She appears well-developed.  Obese  HENT:  Head: Normocephalic and atraumatic.  Nose: Nose normal.  Mouth/Throat:  Oropharynx is clear and moist.  Eyes: Pupils are equal, round, and reactive to light. Conjunctivae and EOM are normal.  Neck: Normal range of motion. Neck supple.  Cardiovascular: Normal rate and regular rhythm.  Pulmonary/Chest: Effort normal and breath sounds normal.  Abdominal: Soft. There is no tenderness.  Musculoskeletal:  Left hip: Positive tenderness, full range of motion. Left lower extremity:NVI with full range of motion and no tenderness  Neurological: She is alert and oriented to person, place, and time. No sensory deficit. She exhibits normal muscle tone.  Skin: Skin is warm. Capillary refill takes less than 2 seconds.  Psychiatric: She has a normal mood and affect. Her behavior is normal.  Vitals reviewed.  Dg Hip Unilat W Or W/o Pelvis 2-3 Views Left  Result Date: 11/11/2018 CLINICAL DATA:  Left hip pain, no injury EXAM: DG HIP (WITH OR WITHOUT PELVIS) 2-3V LEFT COMPARISON:  None. FINDINGS: There is no evidence of hip fracture or dislocation. There is no evidence of arthropathy or other focal bone abnormality. IMPRESSION: Negative. Electronically Signed   By: Franchot Gallo M.D.   On: 11/11/2018 11:55   Results for orders placed or performed in visit on 11/11/18 (from the past 24 hour(s))  POCT glucose (manual entry)     Status: Abnormal   Collection Time: 11/11/18 10:53 AM  Result Value Ref Range   POC Glucose 118 (A) 70 - 99 mg/dl  POCT glycosylated hemoglobin (Hb A1C)     Status: Abnormal   Collection Time: 11/11/18 10:59 AM  Result Value Ref Range   Hemoglobin A1C 5.8 (A) 4.0 - 5.6 %   HbA1c POC (<> result, manual entry)     HbA1c, POC (prediabetic range)     HbA1c, POC (controlled diabetic range)       ASSESSMENT & PLAN: Arella was seen today for hip pain and medication refill.  Diagnoses and all orders for this visit:  Left hip pain -  DG HIP UNILAT W OR W/O PELVIS 2-3 VIEWS LEFT; Future  Controlled type 2 diabetes mellitus without complication,  without long-term current use of insulin (HCC) -     POCT glucose (manual entry) -     POCT glycosylated hemoglobin (Hb A1C) -     HM Diabetes Foot Exam -     Comprehensive metabolic panel -     CBC with Differential/Platelet  Essential hypertension  History of seizure disorder     Advised to take Tylenol Extra Strength for pain as needed.  Avoid NSAIDs.  Demadex discontinued.  Will check CMP and see what her renal function test is today. Well-controlled diabetes.  Stable blood pressure.  No issues today.  A total of 40 minutes was spent in the room with the patient, greater than 50% of which was in counseling/coordination of care regarding chronic medical conditions, medications, blood results, x-ray review and report, and need for follow-up in 3 months.  Patient Instructions       If you have lab work done today you will be contacted with your lab results within the next 2 weeks.  If you have not heard from [place] then please contact us. The fastest way to get your results is to register for My Chart.   IF you received an x-ray today, you will receive an invoice from [facility]. Please contact [facility] at [phone] with questions or concerns regarding your invoice.   IF you received labwork today, you will receive an invoice from [facility]. Please contact [facility] at [phone] with questions or concerns regarding your invoice.   Our billing staff will not be able to assist you with questions regarding bills from these companies.  You will be contacted with the lab results as soon as they are available. The fastest way to get your results is to activate your My Chart account. Instructions are located on the last page of this paperwork. If you have not heard from [place] regarding the results in 2 weeks, please contact this office.     Hip Pain The hip is the joint between the upper legs and the lower pelvis. The bones, cartilage, tendons, and muscles of your  hip joint support your body and allow you to move around. Hip pain can range from a minor ache to severe pain in one or both of your hips. The pain may be felt on the inside of the hip joint near the groin, or the outside near the buttocks and upper thigh. You may also have swelling or stiffness. Follow these instructions at home: Managing pain, stiffness, and swelling  If directed, apply ice to the injured area. ? Put ice in a plastic bag. ? Place a towel between your skin and the bag. ? Leave the ice on for 20 minutes, 2-3 times a day  Sleep with a pillow between your legs on your most comfortable side.  Avoid any activities that cause pain. General instructions  Take over-the-counter and prescription medicines only as told by your health care provider.  Do any exercises as told by your health care provider.  Record the following: ? How often you have hip pain. ? The location of your pain. ? What the pain feels like. ? What makes the pain worse.  Keep all follow-up visits as told by your health care provider. This is important. Contact a health care provider if:  You cannot put weight on your leg.  Your pain or swelling continues or gets worse after one  week.  It gets harder to walk.  You have a fever. Get help right away if:  You fall.  You have a sudden increase in pain and swelling in your hip.  Your hip is red or swollen or very tender to touch. Summary  Hip pain can range from a minor ache to severe pain in one or both of your hips.  The pain may be felt on the inside of the hip joint near the groin, or the outside near the buttocks and upper thigh.  Avoid any activities that cause pain.  Record how often you have hip pain, the location of the pain, what makes it worse and what it feels like. This information is not intended to replace advice given to you by your health care provider. Make sure you discuss any questions you have with your health care  provider. Document Released: 05/15/2010 Document Revised: 10/28/2016 Document Reviewed: 10/28/2016 Elsevier Interactive Patient Education  2018 Elsevier Inc.     Agustina Caroli, MD [facility]

## 2018-11-16 ENCOUNTER — Encounter: Payer: Self-pay | Admitting: *Deleted

## 2018-11-24 ENCOUNTER — Telehealth: Payer: Self-pay | Admitting: Neurology

## 2018-11-24 MED ORDER — LACOSAMIDE 150 MG PO TABS: 150.0000 mg | ORAL_TABLET | Freq: Two times a day (BID) | ORAL | 1 refills | Status: DC

## 2018-11-24 NOTE — Addendum Note (Signed)
Addended by: [name] on: 11/24/2018 01:30 PM   Modules accepted: Orders

## 2018-11-24 NOTE — Telephone Encounter (Signed)
I contacted the patient to confirm dosage. She stated she is taking Vimpat 100 mg 1 tab bid. Patient stated during this most recent seizure she did not loose bladder/bowl function, she did not loose consciousness and no injury took place.

## 2018-11-24 NOTE — Telephone Encounter (Signed)
Patient states she had a seizure this morning and had one last week as well.. Paramedics came today and she was ok by then. Does Vimpat need too be increased? Patient is requesting a call back from Dr. Jannifer Franklin.

## 2018-11-24 NOTE — Telephone Encounter (Signed)
I called the patient.  The patient had a seizure this morning and a seizure last week.  She has not missed any doses of Vimpat or the Keppra.  The Vimpat dose will be increased: to 100 mg in the morning and 150 mg in the evening for 1 week and then go to 150 mg twice daily.

## 2018-12-03 ENCOUNTER — Encounter (HOSPITAL_COMMUNITY): Payer: Self-pay

## 2018-12-03 ENCOUNTER — Emergency Department (HOSPITAL_COMMUNITY)
Admission: EM | Admit: 2018-12-03 | Discharge: 2018-12-03 | Disposition: A | Discharge status: Home / Self Care | Payer: Medicare HMO | Attending: Emergency Medicine | Admitting: Emergency Medicine

## 2018-12-03 ENCOUNTER — Telehealth: Payer: Self-pay | Admitting: Neurology

## 2018-12-03 DIAGNOSIS — E119 Type 2 diabetes mellitus without complications: Secondary | ICD-10-CM | POA: Diagnosis not present

## 2018-12-03 DIAGNOSIS — Z79899 Other long term (current) drug therapy: Secondary | ICD-10-CM | POA: Diagnosis not present

## 2018-12-03 DIAGNOSIS — G40209 Localization-related (focal) (partial) symptomatic epilepsy and epileptic syndromes with complex partial seizures, not intractable, without status epilepticus: Secondary | ICD-10-CM | POA: Diagnosis not present

## 2018-12-03 DIAGNOSIS — I10 Essential (primary) hypertension: Secondary | ICD-10-CM | POA: Diagnosis not present

## 2018-12-03 DIAGNOSIS — R410 Disorientation, unspecified: Secondary | ICD-10-CM | POA: Diagnosis not present

## 2018-12-03 DIAGNOSIS — R569 Unspecified convulsions: Secondary | ICD-10-CM | POA: Diagnosis not present

## 2018-12-03 DIAGNOSIS — Z7984 Long term (current) use of oral hypoglycemic drugs: Secondary | ICD-10-CM | POA: Insufficient documentation

## 2018-12-03 DIAGNOSIS — G40A01 Absence epileptic syndrome, not intractable, with status epilepticus: Secondary | ICD-10-CM | POA: Diagnosis not present

## 2018-12-03 DIAGNOSIS — G40909 Epilepsy, unspecified, not intractable, without status epilepticus: Secondary | ICD-10-CM | POA: Diagnosis not present

## 2018-12-03 NOTE — ED Notes (Signed)
Seizure pads placed on bed rails 

## 2018-12-03 NOTE — Discharge Instructions (Addendum)
Take Vimpat 150 mg twice a day.  If you have additional seizures, talk with Dr. Jannifer Franklin to see how to adjust your medications.

## 2018-12-03 NOTE — Telephone Encounter (Addendum)
Pt called she is wanting to get cpap machine. She feels this will also help with controlling the seizures. appt scheduled 12/30 at 3pm

## 2018-12-03 NOTE — ED Provider Notes (Signed)
[facility] DEPT Provider Note   CSN: [id number] Arrival date & time: 12/03/18  0448     History   Chief Complaint Chief Complaint  Patient presents with  . Seizures    HPI Brandi Gomez is a 47 y.o. female.  The history is provided by the patient.  Seizures    She has history of hypertension, diabetes, bipolar disorder, partial complex seizure disorder and comes in following 2 seizures at home.  Patient does not recall either episode.  Family is not here to give additional history.  She denies bit lip or tongue and denies bowel or bladder dysfunction.  She states she has been compliant with her medications, but she does not know what she takes because family members give her her medications.  Past Medical History:  Diagnosis Date  . Anemia   . Anxiety   . Bipolar 1 disorder ([place])   . Common migraine 05/19/2015  . Depression   . Diabetes mellitus, type II ([place])   . Hypertension   . Irritable bowel syndrome (IBS)   . Mild mental retardation   . Obesity   . Partial complex seizure disorder with intractable epilepsy ([place]) 05/12/2014  . Seizures ([place])    intractable, sz 08/23/17  . Sleep apnea   . Stroke ([place])   . Type II or unspecified type diabetes mellitus without mention of complication, not stated as uncontrolled     Patient Active Problem List   Diagnosis Date Noted  . History of recent stroke 08/06/2017  . OSA (obstructive sleep apnea)   . Cryptogenic stroke ([place]) 05/20/2017  . Controlled type 2 diabetes mellitus without complication, without long-term current use of insulin ([place]) 05/08/2017  . Essential hypertension 07/27/2008  . Convulsions ([place]) 02/05/2007    Past Surgical History:  Procedure Laterality Date  . COLONOSCOPY     2012-normal , Dr Sharlett Iles  . ESOPHAGOGASTRODUODENOSCOPY     normal-Dr Patterson 2012  . LOOP RECORDER INSERTION N/A 05/30/2017   Procedure: Loop Recorder Insertion;  Surgeon: Constance Haw, MD;  Location: [facility];  Service: Cardiovascular;  Laterality: N/A;  . LOOP RECORDER REMOVAL N/A 03/04/2018   Procedure: LOOP RECORDER REMOVAL;  Surgeon: Constance Haw, MD;  Location: [facility];  Service: Cardiovascular;  Laterality: N/A;  . MYRINGOTOMY WITH TUBE PLACEMENT    . MYRINGOTOMY WITH TUBE PLACEMENT Right 11/05/2017   Procedure: MYRINGOTOMY WITH TUBE PLACEMENT;  Surgeon: Melissa Montane, MD;  Location: [facility];  Service: ENT;  Laterality: Right;  right T Tube placement  . NASAL SINUS SURGERY    . TEE WITHOUT CARDIOVERSION N/A 05/30/2017   Procedure: TRANSESOPHAGEAL ECHOCARDIOGRAM (TEE);  Surgeon: Acie Fredrickson Wonda Cheng, MD;  Location: [facility];  Service: Cardiovascular;  Laterality: N/A;     OB History   No obstetric history on file.      Home Medications    Prior to Admission medications   Medication Sig Start Date End Date Taking? Authorizing Provider  acetaminophen (TYLENOL) 500 MG tablet Take 500 mg by mouth every 6 (six) hours as needed for headache.    [provider]  atorvastatin (LIPITOR) 10 MG tablet Take 1 tablet (10 mg total) by mouth daily at 6 PM. 12/23/17   Sagardia, Ines Bloomer, MD  carvedilol (COREG) 3.125 MG tablet Take 3.125 mg by mouth daily. 08/20/17   [provider]  DULoxetine (CYMBALTA) 30 MG capsule TAKE ONE CAPSULE BY MOUTH EVERY DAY 09/11/18   Agustina Caroli  Jose, MD  JANUVIA 50 MG tablet TAKE 1 TABLET BY MOUTH EVERY DAY FOR CONTROL OF BLOOD SUGAR Patient taking differently: Take 50 mg by mouth daily.  07/20/18   Horald Pollen, MD  Lacosamide (VIMPAT) 150 MG TABS Take 1 tablet (150 mg total) by mouth 2 (two) times daily. 11/24/18   [name], MD  levETIRAcetam (KEPPRA) 500 MG tablet TAKE 1 TABLET BY MOUTH TWICE A DAY Patient not taking: Reported on 11/11/2018 06/15/18   [name], MD    Family History Family History  Problem Relation Age of Onset  . Diabetes Mother        passed  away from accidental death  . Hypertension Mother   . Bipolar disorder Father   . Diabetes Daughter   . Leukemia Daughter   . Ovarian cancer Maternal Aunt     Social History Social History   Tobacco Use  . Smoking status: Never Smoker  . Smokeless tobacco: Never Used  Substance Use Topics  . Alcohol use: No  . Drug use: No     Allergies   Amoxicillin; Lisinopril; and Hydrocodone   Review of Systems Review of Systems  Neurological: Positive for seizures.  All other systems reviewed and are negative.    Physical Exam Updated Vital Signs BP (!) 154/94   Pulse 81   Temp 98.4 F (36.9 C) (Oral)   Resp (!) 23   SpO2 100%   Physical Exam Vitals signs and nursing note reviewed.    47 year old female, resting comfortably and in no acute distress. Vital signs are significant for elevated blood pressure and respiratory rate. Oxygen saturation is 100%, which is normal. Head is normocephalic and atraumatic. PERRLA, EOMI. Oropharynx is clear. Neck is nontender and supple without adenopathy or JVD. Back is nontender and there is no CVA tenderness. Lungs are clear without rales, wheezes, or rhonchi. Chest is nontender. Heart has regular rate and rhythm without murmur. Abdomen is soft, flat, nontender without masses or hepatosplenomegaly and peristalsis is normoactive. Extremities have no cyanosis or edema, full range of motion is present. Skin is warm and dry without rash. Neurologic: Mental status is normal, cranial nerves are intact, there are no motor or sensory deficits.  ED Treatments / Results   Procedures Procedures  Medications Ordered in ED Medications - No data to display   Initial Impression / Assessment and Plan / ED Course  I have reviewed the triage vital signs and the nursing notes.  Pertinent labs & imaging results that were available during my care of the patient were reviewed by me and considered in my medical decision making (see chart for  details).  Breakthrough seizures.  Old records are reviewed, and she has multiple ED visits for seizures.  However, on reviewing her records, it is not clear to me what her anticonvulsant doses are.  At her last office visit, it was stated that patient had stopped her Keppra, but there was no mention as to whether she was to go back on it.  She was supposed to be taking Vimpat 100 mg twice a day, and on phone call on December 17, she was advised to increase the Vimpat to 150 mg twice daily.  Patient does not know what she is taking because family members give it to her.  And will need to discuss her medications with family in order to make appropriate treatment decisions.  No indication for blood testing, since blood levels are not immediately available for  Vimpat or Keppra.  Family member has arrived, and it is clear that she is not taking Keppra, he has brought in a box of Vimpat 150 mg, but apparently there is also Vimpat 100 mg at home.  It is not clear how it is being taken.  I discussed the case with Dr. Augustin Coupe from [facility] who recommends that she stay on Vimpat 150 mg twice a day.  I have explained this to the patient and the caregiver.  If she has additional breakthrough seizures, she will need to contact her neurologist.  Final Clinical Impressions(s) / ED Diagnoses   Final diagnoses:  Partial symptomatic epilepsy with complex partial seizures, not intractable, without status epilepticus [facility])    ED Discharge Orders    None       Delora Fuel, MD 72/25/75 [phone]

## 2018-12-03 NOTE — ED Triage Notes (Signed)
Pt arrived from home due to two seizures today, per family petite mal. Patient postictal upon arrival. Medications changed this week.

## 2018-12-03 NOTE — Telephone Encounter (Signed)
Noted, it appears that our sleep lab gave her a donated cpap in 2018. Pt's cpap through [facility] was started in 2014. Pt may be eligible for a new machine through her insurance. I will check with [facility] to find out what they need from [place] to get pt a new cpap.

## 2018-12-03 NOTE — ED Notes (Signed)
Bed: MD80 Expected date:  Expected time:  Means of arrival:  Comments: EMS seizure

## 2018-12-03 NOTE — Telephone Encounter (Signed)
I was able to talk with emergency room physician Dr Roxanne Mins, patient presented to the emergency room for recurrent seizure,  Reviewed previous office note on October 21, 2018, she has stopped the [place] abruptly, supposed to be on titrating dose of Vimpat,   I have advised to keep on titrating Vimpat to 150 mg twice a day, emphasized importance of being compliant with antiepileptic medications

## 2018-12-04 ENCOUNTER — Other Ambulatory Visit: Payer: Self-pay | Admitting: Emergency Medicine

## 2018-12-04 NOTE — Telephone Encounter (Signed)
Requested Prescriptions  Pending Prescriptions Disp Refills  . DULoxetine (CYMBALTA) 30 MG capsule [Pharmacy Med Name: DULOXETINE HCL DR 30 MG CAP] 90 capsule 0    Sig: TAKE 1 CAPSULE BY MOUTH EVERY DAY     Psychiatry: Antidepressants - SNRI Passed - 12/04/2018 10:46 AM      Passed - Last BP in normal range    BP Readings from Last 1 Encounters:  12/03/18 135/86         Passed - Valid encounter within last 6 months    Recent Outpatient Visits          3 weeks ago Left hip pain   Primary Care at [facility], [place], [place]   6 months ago Controlled type 2 diabetes mellitus without complication, without long-term current use of insulin [facility])   Primary Care at [facility], [place], [place]   9 months ago Fungal skin infection   Primary Care at [facility], [place] Bloomer, MD   11 months ago Essential hypertension   Primary Care at [facility], Ines Bloomer, [place]   1 year ago Acute UTI   Primary Care at [facility], Ines Bloomer, [place]      Future Appointments            In 2 months Sag[place], Ines Bloomer, MD Primary Care at [place], [facility]

## 2018-12-04 NOTE — Telephone Encounter (Signed)
Pt called stating she did not miss any dosing of Vimpat 150MG , confirmed she is no longer taking medication Keppra FYI

## 2018-12-04 NOTE — Telephone Encounter (Signed)
I called the patient.  I left a message.  The patient was seen in the emergency room yesterday for a seizure.  It was not clear that the medication that she was taking was being administered properly.  The patient is supposed to be on Vimpat 150 mg twice daily, she apparently has stopped the Keppra.  If she was on the proper dose and she has not missed any doses, she is to contact our office, we may need to make further dose adjustments only Vimpat.

## 2018-12-06 MED ORDER — LACOSAMIDE 200 MG PO TABS: 200.0000 mg | ORAL_TABLET | Freq: Two times a day (BID) | ORAL | 3 refills | Status: DC

## 2018-12-06 NOTE — Telephone Encounter (Signed)
I called the patient, left a message, I will try to get a revisit for her, we may need to go up on the Vimpat to the 200 mg twice daily dose, she apparently was on the 150 mg tablets twice daily and was taking medication properly.  The patient called right back, I gave instructions to go to the 150 mg Vimpat in the morning, 200 in the evening for 2 weeks and go to 200 mg twice daily.

## 2018-12-06 NOTE — Addendum Note (Signed)
Addended by: [name] on: 12/06/2018 04:51 PM   Modules accepted: Orders

## 2018-12-07 ENCOUNTER — Encounter: Payer: Self-pay | Admitting: Neurology

## 2018-12-07 ENCOUNTER — Ambulatory Visit (INDEPENDENT_AMBULATORY_CARE_PROVIDER_SITE_OTHER): Payer: Medicare HMO | Admitting: Neurology

## 2018-12-07 VITALS — BP 156/88 | HR 88 | Ht 61.0 in | Wt 243.0 lb

## 2018-12-07 DIAGNOSIS — G4733 Obstructive sleep apnea (adult) (pediatric): Secondary | ICD-10-CM

## 2018-12-07 NOTE — Patient Instructions (Signed)
I will prescribe a new autoPAP machine and we will send the order to [facility].  You will need to use the autoPAP machine regularly. While your insurance requires that you use PAP at least 4 hours each night on 70% of the nights, I recommend, that you not skip any nights and use it throughout the night if you can. Getting used to PAP and staying with the treatment long term does take time and patience and discipline. Untreated obstructive sleep apnea when it is moderate to severe can have an adverse impact on cardiovascular health and raise her risk for heart disease, arrhythmias, hypertension, congestive heart failure, stroke and diabetes. Untreated obstructive sleep apnea causes sleep disruption, nonrestorative sleep, and sleep deprivation. This can have an impact on your day to day functioning and cause daytime sleepiness and impairment of cognitive function, memory loss, mood disturbance, and problems focussing. Using PAP regularly can improve these symptoms.  You will need a follow up in three months.

## 2018-12-07 NOTE — Progress Notes (Signed)
Order for new cpap sent to [facility] via community message in [place]. Confirmation received that the order transmitted was successful.

## 2018-12-07 NOTE — Telephone Encounter (Signed)
I spoke with Brandi Gomez at [facility]. She reports that pt does not need another sleep study to get a new cpap. They need an order for the cpap and OV notes within the last 6 months that discuss the use and benefit of a cpap.

## 2018-12-07 NOTE — Progress Notes (Signed)
Subjective:    Patient ID: Brandi Gomez is a 47 y.o. female.  HPI     Interim history:   Brandi Gomez is a 47 year old right-handed woman with an underlying medical history of hypertension, diabetes, depression, morbid obesity, seizures, and anxiety who presents for re-evaluation of her OSA. The patient is accompanied by her daughter, today. I last saw her on 06/17/2017, at which time she presented for reevaluation of her OSA, she had lost coverage for her CPAP machine. She did receive a refurbished CPAP machine through our office. I ordered a sleep study at the time but she did not pursue it.  Today, 12/07/2018 (all dictated new, as well as above notes, some dictation done in note pad or Word, outside of chart, may appear as copied):  She reports that the CPAP she has is not working properly. A compliance download was not available for me to review today. She has an older CPAP machine. She reports using it. Her DME company has been [facility] in the past. Her bedtime currently is around midnight, rise time around 7 AM. She would like to get a new machine. Epworth Sleepiness Scale score is 10 out of 24, fatigue score is 45 out of 63. She tries to use her current CPAP as much as possible and benefits from using it, she feels better rested when she can use her CPAP machine. She has had interim ER visits secondary to seizures.  The patient's allergies, current medications, family history, past medical history, past social history, past surgical history and problem list were reviewed and updated as appropriate.    Previously (copied from previous notes for reference):   I saw her on 12/19/2014, at which time she reported difficulty tolerating CPAP. She was not fully compliant with it. She lost coverage for her machine in the interim. She presents for reevaluation. She was originally referred by Dr. Jannifer Franklin.    I first met her on 08/13/2013 at the request of Dr. Jannifer Franklin, at which time we  talked about her recent sleep study results. She was previously diagnosed with obstructive sleep apnea but not compliant with CPAP treatment. She reported trying to adjust CPAP therapy.    I reviewed her compliance data from 09/21/2014 through 12/18/2014 which is a total of 89 days during which time she used her machine 43 days with percent used days greater than 4 hours at only 7%, indicating poor compliance, residual AHI at 1.7 per hour, leak acceptable at 19 L/m for the 95th percentile pressure of 10 cm with EPR of 2, average usage for all days of only 1 hour and 19 minutes, average usage for days on machine of 2 hours and 45 minutes.   She had a split-night sleep study on 03/31/2013. Sleep efficiency was at baseline reduced at 77.3% with a latency to sleep of 11.5 minutes and wake after sleep onset of 12 minutes. She had an arousal index of 21.3 arousals. She had a high normal percentage of stage I sleep, borderline increased percentage of stage II sleep, reduced percentage of deep sleep at 2.5% and markedly increased percentage of REM sleep at 31.9% with a mildly reduced REM latency of 65.5 minutes. She had no significant PLMs or cardiac arrhythmias. She had mild to moderate snoring and slept only in the right lateral position. She had a total of 34 obstructive apneas and 34 obstructive hypopneas, and her AHI was 52.3 per hour. Her baseline oxygen saturation was noted to be only 86%, her  nadir was 66% and REM sleep. She was then titrated on CPAP on a pressure of 5-10 cm of water utilizing a nasal mask. Her AHI was reduced to 1.4 events per hour at 10 cm of pressure. Supine REM sleep was achieved on the final pressure. Based on the test results I prescribed CPAP for her.    I reviewed her compliance data from 04/27/2013 through 05/26/2013 which is a total of 30 days, during which time she uses CPAP every day. Her percent used days greater than 4 hours was 97% indicating excellent compliance. Her average  usage was 6 hours and 10 minutes and her residual AHI was 1.8, indicating a good treatment pressure of 10 cm of water with an EPR level of 2.     I reviewed the most recent compliance data from her compliance to that she brought in from dates 05/15/2013 through 08/12/2013 which is a total of 90 days during which time she used it every day except for 6 days. Her percent used days greater than 4 hours was only 47% and her average usage for all days was 3 hours and 50 minutes only. Her residual AHI was 3.1 indicating again adequate treatment pressure of 10 cm with EPR of 2.    Her Past Medical History Is Significant For: Past Medical History:  Diagnosis Date  . Anemia   . Anxiety   . Bipolar 1 disorder ([place])   . Common migraine 05/19/2015  . Depression   . Diabetes mellitus, type II ([place])   . Hypertension   . Irritable bowel syndrome (IBS)   . Mild mental retardation   . Obesity   . Partial complex seizure disorder with intractable epilepsy ([place]) 05/12/2014  . Seizures ([place])    intractable, sz 08/23/17  . Sleep apnea   . Stroke ([place])   . Type II or unspecified type diabetes mellitus without mention of complication, not stated as uncontrolled     Her Past Surgical History Is Significant For: Past Surgical History:  Procedure Laterality Date  . COLONOSCOPY     2012-normal , Dr Sharlett Iles  . ESOPHAGOGASTRODUODENOSCOPY     normal-Dr Patterson 2012  . LOOP RECORDER INSERTION N/A 05/30/2017   Procedure: Loop Recorder Insertion;  Surgeon: Constance Haw, MD;  Location: [facility];  Service: Cardiovascular;  Laterality: N/A;  . LOOP RECORDER REMOVAL N/A 03/04/2018   Procedure: LOOP RECORDER REMOVAL;  Surgeon: Constance Haw, MD;  Location: [facility];  Service: Cardiovascular;  Laterality: N/A;  . MYRINGOTOMY WITH TUBE PLACEMENT    . MYRINGOTOMY WITH TUBE PLACEMENT Right 11/05/2017   Procedure: MYRINGOTOMY WITH TUBE PLACEMENT;  Surgeon: Melissa Montane, MD;  Location: [facility];  Service: ENT;  Laterality: Right;  right T Tube placement  . NASAL SINUS SURGERY    . TEE WITHOUT CARDIOVERSION N/A 05/30/2017   Procedure: TRANSESOPHAGEAL ECHOCARDIOGRAM (TEE);  Surgeon: Acie Fredrickson Wonda Cheng, MD;  Location: [facility];  Service: Cardiovascular;  Laterality: N/A;    Her Family History Is Significant For: Family History  Problem Relation Age of Onset  . Diabetes Mother        passed away from accidental death  . Hypertension Mother   . Bipolar disorder Father   . Diabetes Daughter   . Leukemia Daughter   . Ovarian cancer Maternal Aunt     Her Social History Is Significant For: Social History   Socioeconomic History  . Marital status: Single    Spouse name: [name]  .  Number of children: 3  . Years of education: 58  . Highest education level: Not on file  Occupational History  . Occupation: disabled    Employer: DISABLED  Social Needs  . Financial resource strain: Not on file  . Food insecurity:    Worry: Not on file    Inability: Not on file  . Transportation needs:    Medical: Not on file    Non-medical: Not on file  Tobacco Use  . Smoking status: Never Smoker  . Smokeless tobacco: Never Used  Substance and Sexual Activity  . Alcohol use: No  . Drug use: No  . Sexual activity: Never  Lifestyle  . Physical activity:    Days per week: Not on file    Minutes per session: Not on file  . Stress: Not on file  Relationships  . Social connections:    Talks on phone: Not on file    Gets together: Not on file    Attends religious service: Not on file    Active member of club or organization: Not on file    Attends meetings of clubs or organizations: Not on file    Relationship status: Not on file  Other Topics Concern  . Not on file  Social History Narrative   Patient lives at home with daughter.    Patient has 3 children.    Patient is right handed.    Patient has a high school education.    Patient is on disability   Patient drinks 2 cups  of caffeine daily.    Her Allergies Are:  Allergies  Allergen Reactions  . Amoxicillin Itching    Has patient had a PCN reaction causing immediate rash, facial/tongue/throat swelling, SOB or lightheadedness with hypotension: yes Has patient had a PCN reaction causing severe rash involving mucus membranes or skin necrosis: no Has patient had a PCN reaction that required hospitalization: no Has patient had a PCN reaction occurring within the last 10 years: yes If all of the above answers are "NO", then may proceed with Cephalosporin use.  Marland Kitchen Lisinopril Swelling    Angioedema   . Hydrocodone Other (See Comments)    Depressed   :   Her Current Medications Are:  Outpatient Encounter Medications as of 12/07/2018  Medication Sig  . acetaminophen (TYLENOL) 500 MG tablet Take 500 mg by mouth every 6 (six) hours as needed for headache.  Marland Kitchen atorvastatin (LIPITOR) 10 MG tablet Take 1 tablet (10 mg total) by mouth daily at 6 PM.  . carvedilol (COREG) 3.125 MG tablet Take 3.125 mg by mouth daily.  . DULoxetine (CYMBALTA) 30 MG capsule TAKE 1 CAPSULE BY MOUTH EVERY DAY  . JANUVIA 50 MG tablet TAKE 1 TABLET BY MOUTH EVERY DAY FOR CONTROL OF BLOOD SUGAR (Patient taking differently: Take 50 mg by mouth daily. )  . lacosamide (VIMPAT) 200 MG TABS tablet Take 1 tablet (200 mg total) by mouth 2 (two) times daily.   No facility-administered encounter medications on file as of 12/07/2018.   :  Review of Systems:  Out of a complete 14 point review of systems, all are reviewed and negative with the exception of these symptoms as listed below: Review of Systems  Neurological:       Pt presents today to discuss her cpap. The cpap that we gave her is "not working right". The pressure seems variable, per her report. Pt does feel better when she uses her cpap. Pt is requesting a new cpap.  Epworth Sleepiness Scale 0= would never doze 1= slight chance of dozing 2= moderate chance of dozing 3= high chance of  dozing  Sitting and reading: 1 Watching TV: 0 Sitting inactive in a public place (ex. Theater or meeting): 2 As a passenger in a car for an hour without a break: 2 Lying down to rest in the afternoon: 3 Sitting and talking to someone: 2 Sitting quietly after lunch (no alcohol): 0 In a car, while stopped in traffic: 0 Total: 10     Objective:  Neurological Exam  Physical Exam Physical Examination:   There were no vitals filed for this visit.  General Examination: The patient is a very pleasant 46 y.o. female in no acute distress. She appears well-developed and well-nourished and well groomed.   HEENT:Normocephalic, atraumatic, pupils are equal, round and reactive to light and accommodation. Extraocular tracking is good without limitation to gaze excursion or nystagmus noted. Normal smooth pursuit is noted. Hearing is grossly intact. Face is symmetric with normal facial animation and normal facial sensation. Speech is clear but slight dysarthria noted. There is no hypophonia. There is no lip, neck/head, jaw or voice tremor. Neck shows FROM. Oropharynx exam reveals: mild mouth dryness, adequate dental hygiene and moderate airway crowding, due to redundant soft palate and larger tongue and tonsils of 2+. Mallampati is class II. Tongue protrudes centrally and palate elevates symmetrically. Neck is 16 1/4 in.   Chest:Clear to auscultation without wheezing, rhonchi or crackles noted.  Heart:S1+S2+0, regular and normal without murmurs, rubs or gallops noted.   Abdomen:Soft, non-tender and non-distended with normal bowel sounds appreciated on auscultation.  Extremities:There is trace pitting edema in the distal lower extremities bilaterally in her ankles.   Skin: Warm and dry without trophic changes noted.   Musculoskeletal: exam reveals no obvious joint deformities, tenderness or joint swelling or erythema.   Neurologically:  Mental status: The patient is awake, alert and  oriented in all 4 spheres. Her memory, attention, language and knowledge are mildly impaired; there is mild psychomotor slowness. There is no aphasia, agnosia, apraxia or anomia. Speech shows minimal dysarthria. Thought process is linear. Mood is normal, and affect is blunted.  Cranial nerves are as described above under HEENT exam.  Motor exam: Normal bulk, strength and tone is noted with very mild grip strength weakness L hand and L hip flexor weakness. There is no drift, tremor or rebound. Fine motor skills are grossly intact.  Cerebellar testing shows no dysmetria or intention tremor. Sensory exam is intact to light touch.  Gait, station and balance:  are unremarkable. No veering to one side is noted. No leaning to one side is noted. Posture is age-appropriate and stance is narrow based. No problems turning are noted. No limp.   Assessment and Plan:   In summary, CLORIA CIRESI is a very pleasant 47 year old female with a history of seizure d/o, Morbid obesity, prior diagnosis of OSA, Hx of stroke in June 2018, who presents for re-evaluation of her obstructive sleep apnea. She was previously diagnosed with severe obstructive sleep apnea. Of noted, she had a split-night sleep study on 03/31/2013, baseline AHI of 52.3 per hour at the time, O2 nadir of 66%. She has had some fluctuation in her weight. She has been using an older CPAP machine. She reports that the machine has not been working properly. She has benefited from CPAP therapy and would like to get a new machine. I will write for an AutoPap machine, range of pressure  between 7 cm and 12 cm. Previously, in the past she was on CPAP of 10 cm. She is advised to be compliant with her new AutoPap machine. We will send the order to her current DME company. She will need a follow-up within 3 months, I will ask her to see one of our nurse practitioners. I answered all their questions today and the patient and her daughter were in agreement with the  plan. I spent 25 minutes in total face-to-face time with the patient, more than 50% of which was spent in counseling and coordination of care, reviewing test results, reviewing medication and discussing or reviewing the diagnosis of OSA, its prognosis and treatment options. Pertinent laboratory and imaging test results that were available during this visit with the patient were reviewed by me and considered in my medical decision making (see chart for details).

## 2018-12-11 ENCOUNTER — Encounter (HOSPITAL_COMMUNITY): Payer: Self-pay | Admitting: Psychiatry

## 2018-12-11 ENCOUNTER — Ambulatory Visit (INDEPENDENT_AMBULATORY_CARE_PROVIDER_SITE_OTHER): Payer: Medicare HMO | Admitting: Psychiatry

## 2018-12-11 VITALS — BP 135/77 | HR 63 | Ht 61.0 in | Wt 254.0 lb

## 2018-12-11 DIAGNOSIS — G40909 Epilepsy, unspecified, not intractable, without status epilepticus: Secondary | ICD-10-CM | POA: Diagnosis not present

## 2018-12-11 DIAGNOSIS — F3160 Bipolar disorder, current episode mixed, unspecified: Secondary | ICD-10-CM

## 2018-12-11 MED ORDER — DULOXETINE HCL 30 MG PO CPEP: 30.0000 mg | ORAL_CAPSULE | Freq: Every day | ORAL | 6 refills | Status: DC

## 2018-12-11 NOTE — Progress Notes (Signed)
Psychiatric Initial Adult Assessment   Patient Identification: Brandi Gomez MRN:  [id number] Date of Evaluation:  12/11/2018 Referral Source: Dr. Tory Emerald Chief Complaint:   Visit Diagnosis: Mood disorder secondary to CVA  Today the patient is seen once again with her boyfriend [name].  They have been together for over 25 years.  The patient overall is doing fairly well.  She did have a couple seizures in the last month and she has seen her neurologist Dr. Jannifer Franklin who changed her seizure medicine.  The patient is a chronic problem poor memory that is been going on for years.  The patient denies any significant mood symptomatology.  She denies depression or anhedonia.  She denies irritability or euphoria.  She actually is sleeping and eating very well.  She is got good energy.  Her hyperreligiosity seems to have lessened.  She is taking Cymbalta 30 mg and according to the patient and her boyfriend she is better.  They are not clear exactly how much she is better but her mood seems to be more even as she is less upset.  The biggest complaint seems to be that she is very suspicious of [name].  She believes he is unfaithful.  In our room he denies any problems with being unfaithful.  He says is been committed to work for 25 years.  Both acknowledge that they are not sexually active but [name] says that is no issue.  The patient denies auditory or visual hallucinations.  She denies any chest pain shortness of breath or any neurological symptoms other than her seizures.  The patient is on a CPAP machine which is very helpful.  Once again it is noted that the patient claims she is on disability for bipolar disorder, has never been psychiatrically hospitalized and at this time takes no specific medicines for bipolar disorder.  I believe her diagnosis is still not clear.  I suspect the best diagnosis is that she does have a mood disorder that is due to a frontal CVA. Associated Signs/Symptoms: Depression  Symptoms:  fatigue, (Hypo) Manic Symptoms:   Anxiety Symptoms:   Psychotic Symptoms:   PTSD Symptoms:   Past Psychiatric History: Cymbalta 30 mg Previous Psychotropic Medications: Cymbalta 30 mg  Substance Abuse History in the last 12 months:  No.  Consequences of Substance Abuse:   Past Medical History:  Past Medical History:  Diagnosis Date  . Anemia   . Anxiety   . Bipolar 1 disorder ([place])   . Common migraine 05/19/2015  . Depression   . Diabetes mellitus, type II ([place])   . Hypertension   . Irritable bowel syndrome (IBS)   . Mild mental retardation   . Obesity   . Partial complex seizure disorder with intractable epilepsy ([place]) 05/12/2014  . Seizures ([place])    intractable, sz 08/23/17  . Sleep apnea   . Stroke ([place])   . Type II or unspecified type diabetes mellitus without mention of complication, not stated as uncontrolled     Past Surgical History:  Procedure Laterality Date  . COLONOSCOPY     2012-normal , Dr Sharlett Iles  . ESOPHAGOGASTRODUODENOSCOPY     normal-Dr Patterson 2012  . LOOP RECORDER INSERTION N/A 05/30/2017   Procedure: Loop Recorder Insertion;  Surgeon: Constance Haw, MD;  Location: [facility];  Service: Cardiovascular;  Laterality: N/A;  . LOOP RECORDER REMOVAL N/A 03/04/2018   Procedure: LOOP RECORDER REMOVAL;  Surgeon: Constance Haw, MD;  Location: [facility];  Service: Cardiovascular;  Laterality: N/A;  . MYRINGOTOMY WITH TUBE PLACEMENT    . MYRINGOTOMY WITH TUBE PLACEMENT Right 11/05/2017   Procedure: MYRINGOTOMY WITH TUBE PLACEMENT;  Surgeon: Melissa Montane, MD;  Location: [facility];  Service: ENT;  Laterality: Right;  right T Tube placement  . NASAL SINUS SURGERY    . TEE WITHOUT CARDIOVERSION N/A 05/30/2017   Procedure: TRANSESOPHAGEAL ECHOCARDIOGRAM (TEE);  Surgeon: Acie Fredrickson Wonda Cheng, MD;  Location: [facility];  Service: Cardiovascular;  Laterality: N/A;    Family Psychiatric History:   Family History:  Family History   Problem Relation Age of Onset  . Diabetes Mother        passed away from accidental death  . Hypertension Mother   . Bipolar disorder Father   . Diabetes Daughter   . Leukemia Daughter   . Ovarian cancer Maternal Aunt     Social History:   Social History   Socioeconomic History  . Marital status: Single    Spouse name: [name]  . Number of children: 3  . Years of education: 72  . Highest education level: Not on file  Occupational History  . Occupation: disabled    Employer: DISABLED  Social Needs  . Financial resource strain: Not on file  . Food insecurity:    Worry: Not on file    Inability: Not on file  . Transportation needs:    Medical: Not on file    Non-medical: Not on file  Tobacco Use  . Smoking status: Never Smoker  . Smokeless tobacco: Never Used  Substance and Sexual Activity  . Alcohol use: No  . Drug use: No  . Sexual activity: Never  Lifestyle  . Physical activity:    Days per week: Not on file    Minutes per session: Not on file  . Stress: Not on file  Relationships  . Social connections:    Talks on phone: Not on file    Gets together: Not on file    Attends religious service: Not on file    Active member of club or organization: Not on file    Attends meetings of clubs or organizations: Not on file    Relationship status: Not on file  Other Topics Concern  . Not on file  Social History Narrative   Patient lives at home with daughter.    Patient has 3 children.    Patient is right handed.    Patient has a high school education.    Patient is on disability   Patient drinks 2 cups of caffeine daily.    Additional Social History:   Allergies:   Allergies  Allergen Reactions  . Amoxicillin Itching    Has patient had a PCN reaction causing immediate rash, facial/tongue/throat swelling, SOB or lightheadedness with hypotension: yes Has patient had a PCN reaction causing severe rash involving mucus membranes or skin necrosis: no Has  patient had a PCN reaction that required hospitalization: no Has patient had a PCN reaction occurring within the last 10 years: yes If all of the above answers are "NO", then may proceed with Cephalosporin use.  Marland Kitchen Lisinopril Swelling    Angioedema   . Hydrocodone Other (See Comments)    Depressed     Metabolic Disorder Labs: Lab Results  Component Value Date   HGBA1C 5.8 (A) 11/11/2018   MPG 163 11/05/2017   MPG 166 05/21/2017   No results found for: PROLACTIN Lab Results  Component Value Date   CHOL 158 05/21/2017  TRIG 161 (H) 05/21/2017   HDL 44 05/21/2017   CHOLHDL 3.6 05/21/2017   VLDL 32 05/21/2017   LDLCALC 82 05/21/2017     Current Medications: Current Outpatient Medications  Medication Sig Dispense Refill  . acetaminophen (TYLENOL) 500 MG tablet Take 500 mg by mouth every 6 (six) hours as needed for headache.    Marland Kitchen atorvastatin (LIPITOR) 10 MG tablet Take 1 tablet (10 mg total) by mouth daily at 6 PM. 90 tablet 3  . carvedilol (COREG) 3.125 MG tablet Take 3.125 mg by mouth daily.    . DULoxetine (CYMBALTA) 30 MG capsule Take 1 capsule (30 mg total) by mouth daily. 30 capsule 6  . JANUVIA 50 MG tablet TAKE 1 TABLET BY MOUTH EVERY DAY FOR CONTROL OF BLOOD SUGAR (Patient taking differently: Take 50 mg by mouth daily. ) 90 tablet 1  . lacosamide (VIMPAT) 200 MG TABS tablet Take 1 tablet (200 mg total) by mouth 2 (two) times daily. 60 tablet 3   No current facility-administered medications for this visit.     Neurologic: Headache: No Seizure: Yes Paresthesias:No  Musculoskeletal: Strength & Muscle Tone: within normal limits Gait & Station: normal Patient leans: Backward and N/A  Psychiatric Specialty Exam: ROS  Blood pressure 135/77, pulse 63, height 5\' 1"  (1.549 m), weight 254 lb (115.2 kg).Body mass index is 47.99 kg/m.  General Appearance: Casual  Eye Contact:  Fair  Speech:  Slurred  Volume:  Normal  Mood:  Dysphoric  Affect:  Appropriate  Thought  Process:  Goal Directed  Orientation:  Full (Time, Place, and Person)  Thought Content:  Logical  Suicidal Thoughts:  No  Homicidal Thoughts:  No  Memory:  Negative  Judgement:  Fair  Insight:  Lacking  Psychomotor Activity:  Decreased  Concentration:    Recall:  [place] of Knowledge:Fair  Language: Fair  Akathisia:  No  Handed:    AIMS (if indicated):    Assets:  Desire for Improvement  ADL's:  Intact  Cognition: WNL  Sleep:      Treatment Plan Summary: At this time I believe the patient's major problem/condition that could explain most of her symptoms is a frontal CVA.  I think this would explain some of her hyperreligiosity which albeit is somewhat better.  I think it affects her cognitive abilities and as a result she experiences a poor memory.  She shows no significant affect of symptomatology.  At this time I believe the use of Cymbalta probably is helpful.  I suspect that there is some level of depression and Cymbalta is beneficial.  Her neurological problems including her seizure disorder are is not well controlled at this time.  She will continue seeing Dr. Jannifer Franklin for this condition.  I do suspect ongoing seizures can also have an effect on her mood state and certainly can affect her cognitive ability.  It is my hope that once her seizure disorder is better controlled her moods will be better.  I will avoid benzodiazepines or neuroleptics in this patient.  Our plan is for her to continue taking Cymbalta and to refer her today to see supportive psychotherapist.  The patient will return to see me in 4 months for a 30-minute visit.  Overall patient is functioning fairly well.  Her energy level is good.  I think her symptomatology would be considered to be minimal to mild.  The patient certainly is not suicidal nor she homicidal.  In my opinion her diagnosis is not clear and  while I will continue to endorse that she has bipolar disorder based upon her disability I think this could be  challenged.   Jerral Ralph, MD 1/3/20209:02 AM

## 2018-12-15 DIAGNOSIS — G4733 Obstructive sleep apnea (adult) (pediatric): Secondary | ICD-10-CM | POA: Diagnosis not present

## 2018-12-15 DIAGNOSIS — R569 Unspecified convulsions: Secondary | ICD-10-CM | POA: Diagnosis not present

## 2018-12-15 DIAGNOSIS — M545 Low back pain: Secondary | ICD-10-CM | POA: Diagnosis not present

## 2018-12-16 ENCOUNTER — Ambulatory Visit (INDEPENDENT_AMBULATORY_CARE_PROVIDER_SITE_OTHER): Payer: Medicare HMO | Admitting: Licensed Clinical Social Worker

## 2018-12-16 DIAGNOSIS — F319 Bipolar disorder, unspecified: Secondary | ICD-10-CM
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
## 2019-01-07 ENCOUNTER — Telehealth: Payer: Self-pay | Admitting: Neurology

## 2019-01-07 ENCOUNTER — Ambulatory Visit (HOSPITAL_COMMUNITY): Payer: Medicare HMO | Admitting: Licensed Clinical Social Worker

## 2019-01-07 NOTE — Telephone Encounter (Signed)
I called the patient, left message.  The patient is having seizures with some regularity, she is having least 1 seizure every 3 months or so.  The Vimpat alone is not preventing her from having seizures, she may need to have another medication added to her regimen such as Lamictal, she has already been on Dilantin and Keppra previously.  We will get a revisit set up for her.

## 2019-01-07 NOTE — Telephone Encounter (Signed)
I contacted the pt and left a vm requesting a call back to schedule f/u visit.
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
## 2019-01-08 NOTE — Telephone Encounter (Signed)
I called the pt and was able to discuss recent seizure activity. Pt states she did not miss vimpat dosage yesterday. Pt advised me she has not been compliant with her C-pap machine and believes seizure was r/t to this. I advised Dr. Jannifer Franklin had reviewed her chart and requested an earlier appt be made to discuss possible medications adjustments.. Pt advised me at this time she did not think she needed an appt and she is " fine". I advised Dr. Jannifer Franklin is requesting an earlier appt but pt refused to make. I advised pt we did have her scheduled for o/v on 05/19/19 and to keep this appt and pt was agreeable.  Pt advised to call back if any other seizures take place. Pt was agreeable.

## 2019-01-15 DIAGNOSIS — G4733 Obstructive sleep apnea (adult) (pediatric): Secondary | ICD-10-CM | POA: Diagnosis not present

## 2019-01-15 DIAGNOSIS — M545 Low back pain: Secondary | ICD-10-CM | POA: Diagnosis not present

## 2019-01-15 DIAGNOSIS — R569 Unspecified convulsions: Secondary | ICD-10-CM | POA: Diagnosis not present

## 2019-01-22 ENCOUNTER — Other Ambulatory Visit: Payer: Self-pay

## 2019-01-22 ENCOUNTER — Emergency Department (HOSPITAL_COMMUNITY)
Admission: EM | Admit: 2019-01-22 | Discharge: 2019-01-23 | Disposition: A | Discharge status: Home / Self Care | Payer: Medicare HMO | Attending: Emergency Medicine | Admitting: Emergency Medicine

## 2019-01-22 ENCOUNTER — Encounter (HOSPITAL_COMMUNITY): Payer: Self-pay

## 2019-01-22 DIAGNOSIS — R4589 Other symptoms and signs involving emotional state: Secondary | ICD-10-CM

## 2019-01-22 DIAGNOSIS — E119 Type 2 diabetes mellitus without complications: Secondary | ICD-10-CM | POA: Insufficient documentation

## 2019-01-22 DIAGNOSIS — I10 Essential (primary) hypertension: Secondary | ICD-10-CM | POA: Insufficient documentation

## 2019-01-22 DIAGNOSIS — F329 Major depressive disorder, single episode, unspecified: Secondary | ICD-10-CM | POA: Insufficient documentation

## 2019-01-22 DIAGNOSIS — Z7984 Long term (current) use of oral hypoglycemic drugs: Secondary | ICD-10-CM | POA: Insufficient documentation

## 2019-01-22 DIAGNOSIS — Z79899 Other long term (current) drug therapy: Secondary | ICD-10-CM | POA: Insufficient documentation

## 2019-01-22 LAB — COMPREHENSIVE METABOLIC PANEL
ALT: 13 U/L (ref 0–44)
AST: 16 U/L (ref 15–41)
Albumin: 3.7 g/dL (ref 3.5–5.0)
Alkaline Phosphatase: 74 U/L (ref 38–126)
Anion gap: 7 (ref 5–15)
BUN: 19 mg/dL (ref 6–20)
CO2: 26 mmol/L (ref 22–32)
Calcium: 8.2 mg/dL — ABNORMAL LOW (ref 8.9–10.3)
Chloride: 105 mmol/L (ref 98–111)
Creatinine, Ser: 1.14 mg/dL — ABNORMAL HIGH (ref 0.44–1.00)
GFR calc Af Amer: >60 mL/min (ref >60)
GFR calc non Af Amer: 56 mL/min — ABNORMAL LOW (ref >60)
Glucose, Bld: 132 mg/dL — ABNORMAL HIGH (ref 70–99)
Potassium: 3.6 mmol/L (ref 3.5–5.1)
SODIUM: 138 mmol/L (ref 135–145)
Total Bilirubin: 0.2 mg/dL — ABNORMAL LOW (ref 0.3–1.2)
Total Protein: 7.3 g/dL (ref 6.5–8.1)

## 2019-01-22 LAB — SALICYLATE LEVEL: Salicylate Lvl: <7 mg/dL (ref 2.8–30.0)

## 2019-01-22 LAB — CBC
HCT: 33.1 % — ABNORMAL LOW (ref 36.0–46.0)
Hemoglobin: 10.1 g/dL — ABNORMAL LOW (ref 12.0–15.0)
MCH: 29.2 pg (ref 26.0–34.0)
MCHC: 30.5 g/dL (ref 30.0–36.0)
MCV: 95.7 fL (ref 80.0–100.0)
Platelets: 267 10*3/uL (ref 150–400)
RBC: 3.46 MIL/uL — AB (ref 3.87–5.11)
RDW: 14.5 % (ref 11.5–15.5)
WBC: 7.5 10*3/uL (ref 4.0–10.5)
nRBC: 0 % (ref 0.0–0.2)

## 2019-01-22 LAB — ETHANOL: Alcohol, Ethyl (B): <10 mg/dL (ref <10)

## 2019-01-22 LAB — I-STAT BETA HCG BLOOD, ED (MC, WL, AP ONLY)

## 2019-01-22 LAB — CBG MONITORING, ED: Glucose-Capillary: 115 mg/dL — ABNORMAL HIGH (ref 70–99)

## 2019-01-22 LAB — ACETAMINOPHEN LEVEL

## 2019-01-22 MED ORDER — CARVEDILOL 3.125 MG PO TABS
3.1250 mg | ORAL_TABLET | Freq: Every day | ORAL | Status: DC
  Administered 2019-01-23: 3.125 mg via ORAL
  Filled 2019-01-22: qty 1

## 2019-01-22 MED ORDER — ATORVASTATIN CALCIUM 10 MG PO TABS: 10.0000 mg | ORAL_TABLET | Freq: Every day | ORAL | Status: DC

## 2019-01-22 MED ORDER — ACETAMINOPHEN 500 MG PO TABS: 500.0000 mg | ORAL_TABLET | Freq: Four times a day (QID) | ORAL | Status: DC | PRN

## 2019-01-22 MED ORDER — LACOSAMIDE 50 MG PO TABS
200.0000 mg | ORAL_TABLET | Freq: Two times a day (BID) | ORAL | Status: DC
  Administered 2019-01-22 – 2019-01-23 (×2): 200 mg via ORAL
  Filled 2019-01-22 (×2): qty 4

## 2019-01-22 MED ORDER — LINAGLIPTIN 5 MG PO TABS
5.0000 mg | ORAL_TABLET | Freq: Every day | ORAL | Status: DC
  Administered 2019-01-23: 5 mg via ORAL
  Filled 2019-01-22: qty 1

## 2019-01-22 MED ORDER — DULOXETINE HCL 30 MG PO CPEP
30.0000 mg | ORAL_CAPSULE | Freq: Every day | ORAL | Status: DC
  Administered 2019-01-23: 30 mg via ORAL
  Filled 2019-01-22: qty 1
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
## 2019-01-22 NOTE — ED Notes (Signed)
Bed: RV20 Expected date:  Expected time:  Means of arrival:  Comments: Tr 4

## 2019-01-23 DIAGNOSIS — F329 Major depressive disorder, single episode, unspecified: Secondary | ICD-10-CM | POA: Diagnosis not present

## 2019-01-23 LAB — RAPID URINE DRUG SCREEN, HOSP PERFORMED
Amphetamines: NOT DETECTED
BENZODIAZEPINES: NOT DETECTED
Barbiturates: NOT DETECTED
Cocaine: NOT DETECTED
Opiates: NOT DETECTED
TETRAHYDROCANNABINOL: NOT DETECTED

## 2019-01-23 LAB — CBG MONITORING, ED: Glucose-Capillary: 92 mg/dL (ref 70–99)

## 2019-01-23 NOTE — ED Notes (Addendum)
PT DISCHARGED. INSTRUCTIONS AND RESOURCES GIVEN. AAOX4. PT IN NO APPARENT DISTRESS OR PAIN. PT ESCORTED TO THE LOBBY. GAIT STEADY. THE OPPORTUNITY TO ASK QUESTIONS WAS PROVIDED.
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
## 2019-01-23 NOTE — ED Provider Notes (Signed)
Pt cleared by psych. No SI/HI. NP has assessed the patient and cleared. No resources needed - outpatient f/u.   Varney Biles, MD 01/23/19 [id number]

## 2019-01-23 NOTE — Progress Notes (Signed)
Per Lindon Romp, NP pt is psych cleared and does not meet criteria for inpt treatment. Pt is recommended to follow up with her current OPT provider. EDP Dr. Kathrynn Humble, MD and Candie Chroman, RN have been advised.  Lind Covert, MSW, LCSW Therapeutic Triage Specialist  [phone]

## 2019-01-23 NOTE — Discharge Instructions (Signed)
We saw you in the ER for your mental health concerns and had our behavioral health team evaluate you. The team feels comfortable sending you home, please follow-up with your outpatient psychiatrist.  Continue taking medications as prescribed. Return to the ER if your symptoms worsen.

## 2019-01-23 NOTE — ED Notes (Signed)
Pt agrees to be discharged back home with resources, due to pt medical history, a shelter will be difficult to place her properly with the care and support that she needs. Pt will call her family to come and get her. Pt wants to wait in the ER lobby. Sandwich and diet coke given.

## 2019-01-23 NOTE — ED Notes (Signed)
Social work at the bedside. Pt does not want to be discharged home, but is unable to ride the bus. She also needs help with possible placement.
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
## 2019-02-03 ENCOUNTER — Ambulatory Visit (HOSPITAL_COMMUNITY): Payer: Medicare HMO | Admitting: Licensed Clinical Social Worker

## 2019-02-07 ENCOUNTER — Other Ambulatory Visit: Payer: Self-pay | Admitting: Emergency Medicine

## 2019-02-08 NOTE — Telephone Encounter (Signed)
Requested medication (s) are due for refill today: Yes  Requested medication (s) are on the active medication list: Yes  Last refill:  By historical provider  Future visit scheduled: Yes  Notes to clinic:  Unable to refill     Requested Prescriptions  Pending Prescriptions Disp Refills   carvedilol (COREG) 3.125 MG tablet [Pharmacy Med Name: CARVEDILOL 3.125 MG TABLET] 180 tablet 0    Sig: TAKE 1 TABLET BY MOUTH TWICE A DAY     Cardiovascular:  Beta Blockers Failed - 02/07/2019  9:15 AM      Failed - Last BP in normal range    BP Readings from Last 1 Encounters:  01/23/19 (!) 130/91         Passed - Last Heart Rate in normal range    Pulse Readings from Last 1 Encounters:  01/23/19 67         Passed - Valid encounter within last 6 months    Recent Outpatient Visits          2 months ago Left hip pain   [facility] at [place], [place], MD   9 months ago Controlled type 2 diabetes mellitus without complication, without long-term current use of insulin [facility])   Primary Care at [facility], [place], MD   12 months ago Fungal skin infection   Primary Care at [facility], Ines Bloomer, MD   1 year ago Essential hypertension   Primary Care at [place], Ines Bloomer, MD   1 year ago Acute UTI   Primary Care at [facility], Ines Bloomer, MD      Future Appointments            In 3 days Sag[place], Ines Bloomer, MD Primary Care at [place], [facility]

## 2019-02-09 ENCOUNTER — Other Ambulatory Visit: Payer: Self-pay | Admitting: Emergency Medicine

## 2019-02-09 ENCOUNTER — Ambulatory Visit (INDEPENDENT_AMBULATORY_CARE_PROVIDER_SITE_OTHER): Payer: Medicare HMO | Admitting: Psychiatry

## 2019-02-09 VITALS — BP 143/81 | HR 77 | Ht 61.0 in | Wt 265.0 lb

## 2019-02-09 DIAGNOSIS — F39 Unspecified mood [affective] disorder: Secondary | ICD-10-CM | POA: Diagnosis not present

## 2019-02-09 DIAGNOSIS — I10 Essential (primary) hypertension: Secondary | ICD-10-CM

## 2019-02-09 DIAGNOSIS — R5383 Other fatigue: Secondary | ICD-10-CM

## 2019-02-09 DIAGNOSIS — R569 Unspecified convulsions: Secondary | ICD-10-CM | POA: Diagnosis not present

## 2019-02-09 DIAGNOSIS — F3162 Bipolar disorder, current episode mixed, moderate: Secondary | ICD-10-CM

## 2019-02-09 MED ORDER — CARBAMAZEPINE ER 200 MG PO CP12: 200.0000 mg | ORAL_CAPSULE | Freq: Two times a day (BID) | ORAL | Status: DC

## 2019-02-09 MED ORDER — DULOXETINE HCL 30 MG PO CPEP: 30.0000 mg | ORAL_CAPSULE | Freq: Every day | ORAL | 6 refills | Status: DC

## 2019-02-09 MED ORDER — CARBAMAZEPINE 200 MG PO TABS: 200.0000 mg | ORAL_TABLET | Freq: Two times a day (BID) | ORAL | 4 refills | Status: DC
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
## 2019-02-11 ENCOUNTER — Ambulatory Visit (HOSPITAL_COMMUNITY): Payer: Medicare HMO | Admitting: Licensed Clinical Social Worker

## 2019-02-11 ENCOUNTER — Ambulatory Visit: Payer: Medicare HMO | Admitting: Emergency Medicine

## 2019-02-13 DIAGNOSIS — R569 Unspecified convulsions: Secondary | ICD-10-CM | POA: Diagnosis not present

## 2019-02-13 DIAGNOSIS — M545 Low back pain: Secondary | ICD-10-CM | POA: Diagnosis not present

## 2019-02-13 DIAGNOSIS — G4733 Obstructive sleep apnea (adult) (pediatric): Secondary | ICD-10-CM | POA: Diagnosis not present

## 2019-02-14 ENCOUNTER — Other Ambulatory Visit: Payer: Self-pay | Admitting: Emergency Medicine

## 2019-02-14 DIAGNOSIS — E119 Type 2 diabetes mellitus without complications: Secondary | ICD-10-CM

## 2019-02-17 ENCOUNTER — Ambulatory Visit (INDEPENDENT_AMBULATORY_CARE_PROVIDER_SITE_OTHER): Payer: Medicare HMO | Admitting: Licensed Clinical Social Worker

## 2019-02-17 ENCOUNTER — Other Ambulatory Visit: Payer: Self-pay

## 2019-02-17 DIAGNOSIS — F3162 Bipolar disorder, current episode mixed, moderate: Secondary | ICD-10-CM

## 2019-02-17 NOTE — Progress Notes (Signed)
   THERAPIST PROGRESS NOTE  Session Time: 12:05pm-12:50pm  Participation Level: Active  Behavioral Response: CasualAlert and ConfusedEuthymic  Type of Therapy: Individual Therapy  Treatment Goals addressed: Coping  Interventions: Motivational Interviewing and Supportive  Summary: Brandi Gomez is a 48 y.o. female who presents with bipolar 1 disorder and complaints about memory problems following stroke and seizures. Client is interested in therapy or placement to help with her trouble with memory. Client's roommate notes change in behaviors with the recent changes in medication from primary psychiatrist, noting decrease irritability. Client denies noticing changes. Client endorses irritability due to daughter checking in on her too frequently and is open to setting boundaries on how often her daughter can check on her; client reports once every 45 mins is acceptable.   Suicidal/Homicidal: Nowithout intent/plan  Therapist Response: Clinician met with client face to face accompanied by 'roommate.' Clinician inquired about reason for return to therapy and recent stressors or problematic symptoms. Clinician inquired how trouble with memory is effecting mood. Clinician validated client desire to be independent as well as frustration with memory problems.  Plan: Return again in 3-4 weeks.  Diagnosis: Axis I: Bipolar, mixed     Olegario Messier, LCSW 02/17/2019

## 2019-02-26 ENCOUNTER — Telehealth: Payer: Self-pay | Admitting: *Deleted

## 2019-02-26 NOTE — Telephone Encounter (Signed)
Faxed refill request to pharmacy for Torsemide 20 mg, this medication was discontinued on 12/01/2018. The request was not authorized, patient needs an appointment. Confirmation page received at 5:48 pm

## 2019-02-27 ENCOUNTER — Other Ambulatory Visit: Payer: Self-pay

## 2019-02-27 ENCOUNTER — Encounter (HOSPITAL_COMMUNITY): Payer: Self-pay | Admitting: *Deleted

## 2019-02-27 ENCOUNTER — Ambulatory Visit (HOSPITAL_COMMUNITY)
Admission: EM | Admit: 2019-02-27 | Discharge: 2019-02-27 | Disposition: A | Discharge status: Home / Self Care | Payer: Medicare HMO | Attending: Family Medicine | Admitting: Family Medicine

## 2019-02-27 DIAGNOSIS — B349 Viral infection, unspecified: Secondary | ICD-10-CM

## 2019-02-27 NOTE — ED Provider Notes (Signed)
[place]   [id number] 02/27/19 Arrival Time: 1104  ASSESSMENT & PLAN:  1. Viral illness    See AVS for discharge instructions.  Discussed typical duration of symptoms. OTC symptom care as needed. Ensure adequate fluid intake and rest. May f/u with PCP or here as needed.  Reviewed expectations re: course of current medical issues. Questions answered. Outlined signs and symptoms indicating need for more acute intervention. Patient verbalized understanding. After Visit Summary given.   SUBJECTIVE: History from: patient.  Brandi Gomez is a 48 y.o. female who presents with complaint of nasal congestion, sneezing and a persistent dry cough; without sore throat. Mild HA. Onset abrupt, 2 d ago; with fatigue and without body aches. SOB: none. Wheezing: none. Fever: no. Overall normal PO intake without n/v. Known sick contacts: no. No specific or significant aggravating or alleviating factors reported. OTC treatment: none reported.  Social History   Tobacco Use  Smoking Status Never Smoker  Smokeless Tobacco Never Used    ROS: As per HPI.   OBJECTIVE:  Vitals:   02/27/19 1136  BP: 135/85  Pulse: 67  Resp: 16  Temp: 98.8 F (37.1 C)  TempSrc: Oral  SpO2: 99%     General appearance: alert; appears fatigued HEENT: nasal congestion; clear runny nose; throat irritation secondary to post-nasal drainage Neck: supple without LAD CV: RRR Lungs: unlabored respirations, symmetrical air entry without wheezing; cough: mild Abd: soft Ext: no LE edema Skin: warm and dry Psychological: alert and cooperative; normal mood and affect  Allergies  Allergen Reactions  . Amoxicillin Itching    Has patient had a PCN reaction causing immediate rash, facial/tongue/throat swelling, SOB or lightheadedness with hypotension: yes Has patient had a PCN reaction causing severe rash involving mucus membranes or skin necrosis: no Has patient had a PCN reaction that required  hospitalization: no Has patient had a PCN reaction occurring within the last 10 years: yes If all of the above answers are "NO", then may proceed with Cephalosporin use.  Marland Kitchen Lisinopril Swelling    Angioedema   . Hydrocodone Other (See Comments)    Depressed     Past Medical History:  Diagnosis Date  . Anemia   . Anxiety   . Bipolar 1 disorder ([place])   . Common migraine 05/19/2015  . Depression   . Diabetes mellitus, type II ([place])   . Hypertension   . Irritable bowel syndrome (IBS)   . Mild mental retardation   . Obesity   . Partial complex seizure disorder with intractable epilepsy ([place]) 05/12/2014  . Seizures ([place])    intractable, sz 08/23/17  . Sleep apnea   . Stroke ([place])   . Type II or unspecified type diabetes mellitus without mention of complication, not stated as uncontrolled    Family History  Problem Relation Age of Onset  . Diabetes Mother        passed away from accidental death  . Hypertension Mother   . Bipolar disorder Father   . Diabetes Daughter   . Leukemia Daughter   . Ovarian cancer Maternal Aunt    Social History   Socioeconomic History  . Marital status: Single    Spouse name: [name]  . Number of children: 3  . Years of education: 25  . Highest education level: Not on file  Occupational History  . Occupation: disabled    Employer: DISABLED  Social Needs  . Financial resource strain: Not on file  . Food insecurity:    Worry: Not  on file    Inability: Not on file  . Transportation needs:    Medical: Not on file    Non-medical: Not on file  Tobacco Use  . Smoking status: Never Smoker  . Smokeless tobacco: Never Used  Substance and Sexual Activity  . Alcohol use: No  . Drug use: No  . Sexual activity: Never  Lifestyle  . Physical activity:    Days per week: Not on file    Minutes per session: Not on file  . Stress: Not on file  Relationships  . Social connections:    Talks on phone: Not on file    Gets together: Not on file     Attends religious service: Not on file    Active member of club or organization: Not on file    Attends meetings of clubs or organizations: Not on file    Relationship status: Not on file  . Intimate partner violence:    Fear of current or ex partner: Not on file    Emotionally abused: Not on file    Physically abused: Not on file    Forced sexual activity: Not on file  Other Topics Concern  . Not on file  Social History Narrative   Patient lives at home with daughter.    Patient has 3 children.    Patient is right handed.    Patient has a high school education.    Patient is on disability   Patient drinks 2 cups of caffeine daily.           Vanessa Kick, MD 03/02/19 [phone]

## 2019-02-27 NOTE — ED Triage Notes (Signed)
C/O cough, HA, neck pain, sneezing x 2 days.  Unk if fevers.

## 2019-02-27 NOTE — Discharge Instructions (Signed)
You may try taking an over the counter medicine such as Claritin daily to see if this helps with your symptoms.

## 2019-02-27 NOTE — ED Notes (Signed)
Bed: UC07 Expected date: 02/27/19 Expected time: 12:15 PM Means of arrival:  Comments: Hold till 12:15

## 2019-03-02 ENCOUNTER — Other Ambulatory Visit: Payer: Self-pay | Admitting: Emergency Medicine

## 2019-03-02 NOTE — Telephone Encounter (Signed)
Please advise on refill. Does not look like this was refilled by anyone in practice before

## 2019-03-07 ENCOUNTER — Encounter: Payer: Self-pay | Admitting: Neurology

## 2019-03-09 ENCOUNTER — Ambulatory Visit (INDEPENDENT_AMBULATORY_CARE_PROVIDER_SITE_OTHER): Payer: Medicare HMO | Admitting: Adult Health

## 2019-03-09 ENCOUNTER — Encounter: Payer: Self-pay | Admitting: Adult Health

## 2019-03-09 ENCOUNTER — Other Ambulatory Visit: Payer: Self-pay

## 2019-03-09 DIAGNOSIS — G4733 Obstructive sleep apnea (adult) (pediatric): Secondary | ICD-10-CM

## 2019-03-09 DIAGNOSIS — Z9989 Dependence on other enabling machines and devices: Secondary | ICD-10-CM | POA: Diagnosis not present

## 2019-03-09 NOTE — Progress Notes (Addendum)
[facility] [address] [place]. [place] [address] [phone]       VIRTUAL VISIT FOLLOW UP NOTE  [name] Date of Birth:  Apr 08, 1971 Medical Record Number:  [id number]     Virtual Visit via Telephone Note  I connected with Brandi Gomez on 03/09/19 at 10:15 AM EDT by telephone located at [facility] and verified that I am speaking with the correct person using two identifiers who reports being located in her own home.    I discussed the limitations, risks, security and privacy concerns of performing an evaluation and management service by telephone and the availability of in person appointments. I also discussed with the patient that there may be a patient responsible charge related to this service. The patient expressed understanding and agreed to proceed.    CHIEF COMPLAINT:  Chief Complaint  Patient presents with   Follow-up    CIPAP follow up tele with Brandi Billow NP    HPI: Brandi Gomez is a well-established patient to this office where she follows with Dr. Rexene Alberts for OSA on CPAP management along with Dr. Jannifer Franklin for seizure disorder management. She was initially scheduled for an 15-month office follow-up visit regarding CPAP compliance but due to [place] pandemic, in office visits are limited therefore rescheduled for telemedicine visit via video.  CPAP compliance report reviewed from 02/06/19-03/07/19 which showed 30 out of 30 usage days with greater than 4 hour usage of 26 days for 87% compliance with average usage 5 hours and 44 minutes.  Residual AHI 0.9 with leaks in the 95th percentile 38.5 L/min and pressure in the 95th percentile 11.7 cm H2O on 7 cm H2O minimum pressure and 12 cm H2O max pressure with a EPR level 1.  She reports overall good use of her CPAP and experiences minimal daytime fatigue.  She does report obtaining a different mask while in the ED a couple months ago and has been using the mask since then  and has been experiencing leaking around the chin area.  She attempts to readjust the straps but she feels as though there is still consistently.  She has not attempted to reach out to her DME company at this time.   Of note, she does endorse worsening of memory and concentration which has been ongoing for many years.  She plans on speaking to Dr. Jannifer Franklin at follow-up visit regarding her memory as scheduled appointment in 05/2019.  Confirmed all current medications, allergy list and medical history as correct     ROS:   Fatigue severity scale: 13/63 Epworth sleepiness scale: 1/24  See HPI   PMH:  Past Medical History:  Diagnosis Date   Anemia    Anxiety    Bipolar 1 disorder ([place])    Common migraine 05/19/2015   Depression    Diabetes mellitus, type II ([place])    Hypertension    Irritable bowel syndrome (IBS)    Mild mental retardation    Obesity    Partial complex seizure disorder with intractable epilepsy ([place]) 05/12/2014   Seizures ([place])    intractable, sz 08/23/17   Sleep apnea    Stroke ([place])    Type II or unspecified type diabetes mellitus without mention of complication, not stated as uncontrolled     PSH:  Past Surgical History:  Procedure Laterality Date   COLONOSCOPY     2012-normal , Dr Sharlett Iles   ESOPHAGOGASTRODUODENOSCOPY     normal-Dr Patterson 2012   LOOP RECORDER INSERTION N/A  05/30/2017   Procedure: Loop Recorder Insertion;  Surgeon: Constance Haw, MD;  Location: [facility];  Service: Cardiovascular;  Laterality: N/A;   LOOP RECORDER REMOVAL N/A 03/04/2018   Procedure: LOOP RECORDER REMOVAL;  Surgeon: Constance Haw, MD;  Location: [facility];  Service: Cardiovascular;  Laterality: N/A;   MYRINGOTOMY WITH TUBE PLACEMENT     MYRINGOTOMY WITH TUBE PLACEMENT Right 11/05/2017   Procedure: MYRINGOTOMY WITH TUBE PLACEMENT;  Surgeon: Melissa Montane, MD;  Location: [facility];  Service: ENT;  Laterality: Right;  right T Tube  placement   NASAL SINUS SURGERY     TEE WITHOUT CARDIOVERSION N/A 05/30/2017   Procedure: TRANSESOPHAGEAL ECHOCARDIOGRAM (TEE);  Surgeon: Acie Fredrickson, Wonda Cheng, MD;  Location: [facility];  Service: Cardiovascular;  Laterality: N/A;    Social History:  Social History   Socioeconomic History   Marital status: Single    Spouse name: [name]   Number of children: 3   Years of education: 12   Highest education level: Not on file  Occupational History   Occupation: disabled    Fish farm manager: DISABLED  Social Designer, fashion/clothing strain: Not on file   Food insecurity:    Worry: Not on file    Inability: Not on file   Transportation needs:    Medical: Not on file    Non-medical: Not on file  Tobacco Use   Smoking status: Never Smoker   Smokeless tobacco: Never Used  Substance and Sexual Activity   Alcohol use: No   Drug use: No   Sexual activity: Never  Lifestyle   Physical activity:    Days per week: Not on file    Minutes per session: Not on file   Stress: Not on file  Relationships   Social connections:    Talks on phone: Not on file    Gets together: Not on file    Attends religious service: Not on file    Active member of club or organization: Not on file    Attends meetings of clubs or organizations: Not on file    Relationship status: Not on file   Intimate partner violence:    Fear of current or ex partner: Not on file    Emotionally abused: Not on file    Physically abused: Not on file    Forced sexual activity: Not on file  Other Topics Concern   Not on file  Social History Narrative   Patient lives at home with daughter.    Patient has 3 children.    Patient is right handed.    Patient has a high school education.    Patient is on disability   Patient drinks 2 cups of caffeine daily.    Family History:  Family History  Problem Relation Age of Onset   Diabetes Mother        passed away from accidental death   Hypertension Mother     Bipolar disorder Father    Diabetes Daughter    Leukemia Daughter    Ovarian cancer Maternal Aunt     Medications:   Current Outpatient Medications on File Prior to Visit  Medication Sig Dispense Refill   acetaminophen (TYLENOL) 500 MG tablet Take 500 mg by mouth every 6 (six) hours as needed for headache.     atorvastatin (LIPITOR) 10 MG tablet Take 1 tablet (10 mg total) by mouth daily at 6 PM. 90 tablet 3   carbamazepine (TEGRETOL) 200 MG tablet Take 1  tablet (200 mg total) by mouth 2 (two) times daily at 10 AM and 5 PM. 60 tablet 4   carvedilol (COREG) 3.125 MG tablet Take 3.125 mg by mouth daily.     DULoxetine (CYMBALTA) 30 MG capsule Take 1 capsule (30 mg total) by mouth daily. 30 capsule 6   lacosamide (VIMPAT) 200 MG TABS tablet Take 1 tablet (200 mg total) by mouth 2 (two) times daily. 60 tablet 3   sitaGLIPtin (JANUVIA) 50 MG tablet Take 1 tablet (50 mg total) by mouth daily. Schedule appointment for additional refills. 90 tablet 0   Current Facility-Administered Medications on File Prior to Visit  Medication Dose Route Frequency Provider Last Rate Last Dose   carbamazepine (EQUETRO) 12 hr capsule 200 mg  200 mg Oral BID Norma Fredrickson, MD        Allergies:   Allergies  Allergen Reactions   Amoxicillin Itching    Has patient had a PCN reaction causing immediate rash, facial/tongue/throat swelling, SOB or lightheadedness with hypotension: yes Has patient had a PCN reaction causing severe rash involving mucus membranes or skin necrosis: no Has patient had a PCN reaction that required hospitalization: no Has patient had a PCN reaction occurring within the last 10 years: yes If all of the above answers are "NO", then may proceed with Cephalosporin use.   Lisinopril Swelling    Angioedema    Hydrocodone Other (See Comments)    Depressed      Physical Exam  General: Pleasant middle-aged African-American female asking and answering appropriate questions  during conversation  Exam limited due to visit type     ASSESSMENT/PLAN: RAELI WIENS is a 48 y.o. year old female who is followed by Dr. Rexene Alberts for OSA on CPAP management and underwent telemedicine telephone visit today to review CPAP compliance.   After review of CPAP compliance report, no indication to change settings at this time as she is tolerating well with optimal residual AHI.  Order will be placed to her DME company [facility] requesting mask refit due to high leak.  Advised her to continue to follow with DME company for any concerns regarding her machine or mask along with any needs supplies.  She will continue to follow with Dr. Jannifer Franklin for seizure management along with memory concerns on 05/2019.    She will follow-up in 1 years time regarding CPAP compliance with Jinny Blossom, NP or call earlier if needed   I discussed the assessment and treatment plan with the patient along with reviewing CPAP download.  The patient was provided an opportunity to ask questions and all were answered to their satisfaction. The patient agreed with the plan and verbalized an understanding of the instructions.   I provided 29 minutes of non-face-to-face time during this encounter.   Venancio Poisson, AGNP-BC  [facility] [address] [place], [place] [address]  Phone [phone] Fax [phone] Note: This document was prepared with digital dictation and possible smart phrase technology. Any transcriptional errors that result from this process are unintentional.  I reviewed the above note and documentation by the Nurse Practitioner and agree with the history, physical exam, assessment and plan as outlined above. I was available for consultation. Star Age, MD, PhD [facility])

## 2019-03-16 ENCOUNTER — Other Ambulatory Visit: Payer: Self-pay | Admitting: Emergency Medicine

## 2019-03-16 ENCOUNTER — Other Ambulatory Visit: Payer: Self-pay

## 2019-03-16 ENCOUNTER — Ambulatory Visit (INDEPENDENT_AMBULATORY_CARE_PROVIDER_SITE_OTHER): Payer: Medicare HMO | Admitting: Licensed Clinical Social Worker

## 2019-03-16 DIAGNOSIS — M545 Low back pain: Secondary | ICD-10-CM | POA: Diagnosis not present

## 2019-03-16 DIAGNOSIS — G4733 Obstructive sleep apnea (adult) (pediatric): Secondary | ICD-10-CM | POA: Diagnosis not present

## 2019-03-16 DIAGNOSIS — F3162 Bipolar disorder, current episode mixed, moderate: Secondary | ICD-10-CM

## 2019-03-16 DIAGNOSIS — R569 Unspecified convulsions: Secondary | ICD-10-CM | POA: Diagnosis not present

## 2019-03-16 DIAGNOSIS — I10 Essential (primary) hypertension: Secondary | ICD-10-CM

## 2019-03-16 NOTE — Progress Notes (Signed)
Virtual Visit via Telephone Note  I connected with Brandi Gomez on 03/16/19 at 12:00 PM EDT by telephone and verified that I am speaking with the correct person using two identifiers.   I discussed the limitations, risks, security and privacy concerns of performing an evaluation and management service by telephone and the availability of in person appointments. I also discussed with the patient that there may be a patient responsible charge related to this service. The patient expressed understanding and agreed to proceed.   I discussed the assessment and treatment plan with the patient. The patient was provided an opportunity to ask questions and all were answered. The patient agreed with the plan and demonstrated an understanding of the instructions.   The patient was advised to call back or seek an in-person evaluation if the symptoms worsen or if the condition fails to improve as anticipated.  I provided 15 minutes of non-face-to-face time during this encounter.    THERAPIST PROGRESS NOTE  Session Time: 12:05-12:20pm  Participation Level: Active  Behavioral Response: NAAlertDysphoric and Euphoric  Type of Therapy: Individual Therapy  Treatment Goals addressed: Coping  Interventions: CBT and Supportive  Summary: Brandi Gomez is a 48 y.o. female who presents with bipolar 1 disorder. Client continues to be frustrated with memory concerns and not being able to live on her own. Client feels she has no privacy currently. Client reports she feels she is being fussed at and is not allowed to have her own opinion in her current home.  Suicidal/Homicidal: Nowithout intent/plan  Therapist Response: Clinician checked in with client, assessing for SI/HI/psychosis and overall level of functioning. Clinician inquired about changes in current symptoms.Clinician actively listened and validated client feelings.  Plan: Return again in 3-4 weeks.  Diagnosis: Axis I: Bipolar,  mixed       Brandi Messier, LCSW 03/16/2019

## 2019-03-17 ENCOUNTER — Ambulatory Visit (INDEPENDENT_AMBULATORY_CARE_PROVIDER_SITE_OTHER): Payer: Medicare HMO | Admitting: Psychiatry

## 2019-03-17 ENCOUNTER — Other Ambulatory Visit: Payer: Self-pay

## 2019-03-17 DIAGNOSIS — F3110 Bipolar disorder, current episode manic without psychotic features, unspecified: Secondary | ICD-10-CM | POA: Diagnosis not present

## 2019-03-17 MED ORDER — CARBAMAZEPINE 200 MG PO TABS: 200.0000 mg | ORAL_TABLET | Freq: Two times a day (BID) | ORAL | 4 refills | Status: DC

## 2019-03-17 MED ORDER — DULOXETINE HCL 30 MG PO CPEP: 30.0000 mg | ORAL_CAPSULE | Freq: Every day | ORAL | 6 refills | Status: DC

## 2019-03-17 NOTE — Progress Notes (Signed)
Psychiatric Initial Adult Assessment   Patient Identification: Brandi Gomez MRN:  [id number] Date of Evaluation:  03/17/2019 Referral Source: Dr. Tory Emerald Chief Complaint:  Suspiciousness/paranoia Visit Diagnosis: Mood disorder secondary to CVA  Today I spoke to both the patient on 1 number and her husband a different number.  Generally emotionally things are about the same.  The patient is not making efforts to leave which is good.  She still is quite suspicious.  This is evident by the fact that she spends a lot of time by herself in her room.  This is not related to the viral epidemic.  She has issues with trusting people and an issue with her daughter.  The patient wants to live on her own.  Her husband shares with her that this is just about impossible.  The patient did risky behavior in the past.  She would go to the neighborhood where she is likely to get her.  She apparently is eating very well and sleeping fairly well.  She is not violent or agitated.  There is no overt evidence of psychosis at this time.  She takes her medicine denies any side effects from it.  There is no evidence she is using alcohol or using drugs.  Associated Signs/Symptoms: Depression Symptoms:  fatigue, (Hypo) Manic Symptoms:   Anxiety Symptoms:   Psychotic Symptoms:   PTSD Symptoms:   Past Psychiatric History: Cymbalta 30 mg Previous Psychotropic Medications: Cymbalta 30 mg  Substance Abuse History in the last 12 months:  No.  Consequences of Substance Abuse:   Past Medical History:  Past Medical History:  Diagnosis Date  . Anemia   . Anxiety   . Bipolar 1 disorder ([place])   . Common migraine 05/19/2015  . Depression   . Diabetes mellitus, type II ([place])   . Hypertension   . Irritable bowel syndrome (IBS)   . Mild mental retardation   . Obesity   . Partial complex seizure disorder with intractable epilepsy ([place]) 05/12/2014  . Seizures ([place])    intractable, sz 08/23/17  . Sleep apnea   . Stroke  ([place])   . Type II or unspecified type diabetes mellitus without mention of complication, not stated as uncontrolled     Past Surgical History:  Procedure Laterality Date  . COLONOSCOPY     2012-normal , Dr Sharlett Iles  . ESOPHAGOGASTRODUODENOSCOPY     normal-Dr Patterson 2012  . LOOP RECORDER INSERTION N/A 05/30/2017   Procedure: Loop Recorder Insertion;  Surgeon: Constance Haw, MD;  Location: [facility];  Service: Cardiovascular;  Laterality: N/A;  . LOOP RECORDER REMOVAL N/A 03/04/2018   Procedure: LOOP RECORDER REMOVAL;  Surgeon: Constance Haw, MD;  Location: [facility];  Service: Cardiovascular;  Laterality: N/A;  . MYRINGOTOMY WITH TUBE PLACEMENT    . MYRINGOTOMY WITH TUBE PLACEMENT Right 11/05/2017   Procedure: MYRINGOTOMY WITH TUBE PLACEMENT;  Surgeon: Melissa Montane, MD;  Location: [facility];  Service: ENT;  Laterality: Right;  right T Tube placement  . NASAL SINUS SURGERY    . TEE WITHOUT CARDIOVERSION N/A 05/30/2017   Procedure: TRANSESOPHAGEAL ECHOCARDIOGRAM (TEE);  Surgeon: Acie Fredrickson Wonda Cheng, MD;  Location: [facility];  Service: Cardiovascular;  Laterality: N/A;    Family Psychiatric History:   Family History:  Family History  Problem Relation Age of Onset  . Diabetes Mother        passed away from accidental death  . Hypertension Mother   . Bipolar disorder Father   .  Diabetes Daughter   . Leukemia Daughter   . Ovarian cancer Maternal Aunt     Social History:   Social History   Socioeconomic History  . Marital status: Single    Spouse name: [name]  . Number of children: 3  . Years of education: 63  . Highest education level: Not on file  Occupational History  . Occupation: disabled    Employer: DISABLED  Social Needs  . Financial resource strain: Not on file  . Food insecurity:    Worry: Not on file    Inability: Not on file  . Transportation needs:    Medical: Not on file    Non-medical: Not on file  Tobacco Use  . Smoking  status: Never Smoker  . Smokeless tobacco: Never Used  Substance and Sexual Activity  . Alcohol use: No  . Drug use: No  . Sexual activity: Never  Lifestyle  . Physical activity:    Days per week: Not on file    Minutes per session: Not on file  . Stress: Not on file  Relationships  . Social connections:    Talks on phone: Not on file    Gets together: Not on file    Attends religious service: Not on file    Active member of club or organization: Not on file    Attends meetings of clubs or organizations: Not on file    Relationship status: Not on file  Other Topics Concern  . Not on file  Social History Narrative   Patient lives at home with daughter.    Patient has 3 children.    Patient is right handed.    Patient has a high school education.    Patient is on disability   Patient drinks 2 cups of caffeine daily.    Additional Social History:   Allergies:   Allergies  Allergen Reactions  . Amoxicillin Itching    Has patient had a PCN reaction causing immediate rash, facial/tongue/throat swelling, SOB or lightheadedness with hypotension: yes Has patient had a PCN reaction causing severe rash involving mucus membranes or skin necrosis: no Has patient had a PCN reaction that required hospitalization: no Has patient had a PCN reaction occurring within the last 10 years: yes If all of the above answers are "NO", then may proceed with Cephalosporin use.  Marland Kitchen Lisinopril Swelling    Angioedema   . Hydrocodone Other (See Comments)    Depressed     Metabolic Disorder Labs: Lab Results  Component Value Date   HGBA1C 5.8 (A) 11/11/2018   MPG 163 11/05/2017   MPG 166 05/21/2017   No results found for: PROLACTIN Lab Results  Component Value Date   CHOL 158 05/21/2017   TRIG 161 (H) 05/21/2017   HDL 44 05/21/2017   CHOLHDL 3.6 05/21/2017   VLDL 32 05/21/2017   LDLCALC 82 05/21/2017     Current Medications: Current Outpatient Medications  Medication Sig Dispense  Refill  . acetaminophen (TYLENOL) 500 MG tablet Take 500 mg by mouth every 6 (six) hours as needed for headache.    Marland Kitchen atorvastatin (LIPITOR) 10 MG tablet Take 1 tablet (10 mg total) by mouth daily at 6 PM. 90 tablet 3  . carbamazepine (TEGRETOL) 200 MG tablet Take 1 tablet (200 mg total) by mouth 2 (two) times daily at 10 AM and 5 PM. 60 tablet 4  . carvedilol (COREG) 3.125 MG tablet Take 3.125 mg by mouth daily.    . DULoxetine (CYMBALTA) 30 MG  capsule Take 1 capsule (30 mg total) by mouth daily. 30 capsule 6  . lacosamide (VIMPAT) 200 MG TABS tablet Take 1 tablet (200 mg total) by mouth 2 (two) times daily. 60 tablet 3  . sitaGLIPtin (JANUVIA) 50 MG tablet Take 1 tablet (50 mg total) by mouth daily. Schedule appointment for additional refills. 90 tablet 0   Current Facility-Administered Medications  Medication Dose Route Frequency Provider Last Rate Last Dose  . carbamazepine (EQUETRO) 12 hr capsule 200 mg  200 mg Oral BID Norma Fredrickson, MD        Neurologic: Headache: No Seizure: Yes Paresthesias:No  Musculoskeletal: Strength & Muscle Tone: within normal limits Gait & Station: normal Patient leans: Backward and N/A  Psychiatric Specialty Exam: ROS  There were no vitals taken for this visit.There is no height or weight on file to calculate BMI.  General Appearance: Casual  Eye Contact:  Fair  Speech:  Slurred  Volume:  Normal  Mood:  Dysphoric  Affect:  Appropriate  Thought Process:  Goal Directed  Orientation:  Full (Time, Place, and Person)  Thought Content:  Logical  Suicidal Thoughts:  No  Homicidal Thoughts:  No  Memory:  Negative  Judgement:  Fair  Insight:  Lacking  Psychomotor Activity:  Decreased  Concentration:    Recall:  [place] of Knowledge:Fair  Language: Fair  Akathisia:  No  Handed:    AIMS (if indicated):    Assets:  Desire for Improvement  ADL's:  Intact  Cognition: WNL  Sleep:      Treatment Plan Summary:  This patient is #1 at this  time the patient is #1 problem seems to be a mood disorder secondary to a CVA.  I think this likely she has a seizure disorder as well directly related to a CVA.  She is started on Tegretol twice a day and is taking it as prescribed.  She is not having any side effects from it did not clear she is improving from it.  She will return to see me in 6 weeks we will we can see her face-to-face.  She will continue taking Cymbalta.  The patient is not suicidal nor she homicidal.  The patient amazingly is quite compliant about her medicines.  Her husband [name] is very supportive. Jerral Ralph, MD 4/8/20204:31 PM

## 2019-03-18 ENCOUNTER — Encounter (HOSPITAL_COMMUNITY): Payer: Self-pay | Admitting: Emergency Medicine

## 2019-03-18 ENCOUNTER — Ambulatory Visit (HOSPITAL_COMMUNITY)
Admission: EM | Admit: 2019-03-18 | Discharge: 2019-03-18 | Disposition: A | Discharge status: Home / Self Care | Payer: Medicare HMO | Attending: Urgent Care | Admitting: Urgent Care

## 2019-03-18 DIAGNOSIS — N912 Amenorrhea, unspecified: Secondary | ICD-10-CM | POA: Diagnosis not present

## 2019-03-18 DIAGNOSIS — E119 Type 2 diabetes mellitus without complications: Secondary | ICD-10-CM

## 2019-03-18 DIAGNOSIS — F79 Unspecified intellectual disabilities: Secondary | ICD-10-CM

## 2019-03-18 DIAGNOSIS — E669 Obesity, unspecified: Secondary | ICD-10-CM

## 2019-03-18 DIAGNOSIS — R600 Localized edema: Secondary | ICD-10-CM

## 2019-03-18 DIAGNOSIS — I10 Essential (primary) hypertension: Secondary | ICD-10-CM

## 2019-03-18 LAB — POCT PREGNANCY, URINE: Preg Test, Ur: NEGATIVE

## 2019-03-18 NOTE — Discharge Instructions (Signed)
Avoid salt in your diet, use compression stockings. Prop your legs up and make sure you follow up with your PCP.

## 2019-03-18 NOTE — ED Triage Notes (Signed)
[name] bilateral lower leg swelling x3 days. No pain, just numbness.

## 2019-03-18 NOTE — ED Provider Notes (Addendum)
MRN: [id number] DOB: [birth year]  Subjective:   Brandi Gomez is a 48 y.o. female presenting for 3-day history of bilateral lower leg swelling.  Patient is also concerned about having pregnancy, states that she is 1 week late.  Would like to do a urine pregnancy test.  Denies history of heart failure, DVT, PVD.  She does have a history of high blood pressure, stroke, diabetes.  Also has a history of mild mental retardation.  Patient's last creatinine was 1.14, GFR greater than 60 on 01/22/2019.   Current Facility-Administered Medications:  .  carbamazepine (EQUETRO) 12 hr capsule 200 mg, 200 mg, Oral, BID, Plovsky, Gerald, MD  Current Outpatient Medications:  .  acetaminophen (TYLENOL) 500 MG tablet, Take 500 mg by mouth every 6 (six) hours as needed for headache., Disp: , Rfl:  .  atorvastatin (LIPITOR) 10 MG tablet, Take 1 tablet (10 mg total) by mouth daily at 6 PM., Disp: 90 tablet, Rfl: 3 .  carbamazepine (TEGRETOL) 200 MG tablet, Take 1 tablet (200 mg total) by mouth 2 (two) times daily at 10 AM and 5 PM., Disp: 60 tablet, Rfl: 4 .  carvedilol (COREG) 3.125 MG tablet, Take 3.125 mg by mouth daily., Disp: , Rfl:  .  DULoxetine (CYMBALTA) 30 MG capsule, Take 1 capsule (30 mg total) by mouth daily., Disp: 30 capsule, Rfl: 6 .  lacosamide (VIMPAT) 200 MG TABS tablet, Take 1 tablet (200 mg total) by mouth 2 (two) times daily., Disp: 60 tablet, Rfl: 3 .  sitaGLIPtin (JANUVIA) 50 MG tablet, Take 1 tablet (50 mg total) by mouth daily. Schedule appointment for additional refills., Disp: 90 tablet, Rfl: 0    Allergies  Allergen Reactions  . Amoxicillin Itching    Has patient had a PCN reaction causing immediate rash, facial/tongue/throat swelling, SOB or lightheadedness with hypotension: yes Has patient had a PCN reaction causing severe rash involving mucus membranes or skin necrosis: no Has patient had a PCN reaction that required hospitalization: no Has patient had a PCN reaction  occurring within the last 10 years: yes If all of the above answers are "NO", then may proceed with Cephalosporin use.  Marland Kitchen Lisinopril Swelling    Angioedema   . Hydrocodone Other (See Comments)    Depressed     Past Medical History:  Diagnosis Date  . Anemia   . Anxiety   . Bipolar 1 disorder ([place])   . Common migraine 05/19/2015  . Depression   . Diabetes mellitus, type II ([place])   . Hypertension   . Irritable bowel syndrome (IBS)   . Mild mental retardation   . Obesity   . Partial complex seizure disorder with intractable epilepsy ([place]) 05/12/2014  . Seizures ([place])    intractable, sz 08/23/17  . Sleep apnea   . Stroke ([place])   . Type II or unspecified type diabetes mellitus without mention of complication, not stated as uncontrolled      Past Surgical History:  Procedure Laterality Date  . COLONOSCOPY     2012-normal , Dr Sharlett Iles  . ESOPHAGOGASTRODUODENOSCOPY     normal-Dr Patterson 2012  . LOOP RECORDER INSERTION N/A 05/30/2017   Procedure: Loop Recorder Insertion;  Surgeon: Constance Haw, MD;  Location: [facility];  Service: Cardiovascular;  Laterality: N/A;  . LOOP RECORDER REMOVAL N/A 03/04/2018   Procedure: LOOP RECORDER REMOVAL;  Surgeon: Constance Haw, MD;  Location: [facility];  Service: Cardiovascular;  Laterality: N/A;  . MYRINGOTOMY WITH TUBE PLACEMENT    .  MYRINGOTOMY WITH TUBE PLACEMENT Right 11/05/2017   Procedure: MYRINGOTOMY WITH TUBE PLACEMENT;  Surgeon: Melissa Montane, MD;  Location: [facility];  Service: ENT;  Laterality: Right;  right T Tube placement  . NASAL SINUS SURGERY    . TEE WITHOUT CARDIOVERSION N/A 05/30/2017   Procedure: TRANSESOPHAGEAL ECHOCARDIOGRAM (TEE);  Surgeon: Acie Fredrickson Wonda Cheng, MD;  Location: [facility];  Service: Cardiovascular;  Laterality: N/A;    Review of Systems  Constitutional: Negative for fever and malaise/fatigue.  HENT: Negative for ear pain, sinus pain and sore throat.   Eyes: Negative for blurred  vision and double vision.  Respiratory: Negative for cough, hemoptysis, shortness of breath and wheezing.   Cardiovascular: Negative for chest pain and palpitations.  Gastrointestinal: Negative for abdominal pain, nausea and vomiting.  Genitourinary: Negative for dysuria and hematuria.  Musculoskeletal: Negative for myalgias.  Skin: Negative for rash.  Neurological: Negative for dizziness, weakness and headaches.  Psychiatric/Behavioral: Negative for depression and substance abuse.    Objective:   Vitals: BP 139/81   Pulse 70   Temp 98.1 F (36.7 C)   Resp 16   SpO2 98%   Physical Exam Constitutional:      General: She is not in acute distress.    Appearance: Normal appearance. She is well-developed. She is obese. She is not ill-appearing, toxic-appearing or diaphoretic.  HENT:     Head: Normocephalic and atraumatic.     Nose: Nose normal.     Mouth/Throat:     Mouth: Mucous membranes are moist.     Pharynx: Oropharynx is clear.  Eyes:     General: No scleral icterus.    Extraocular Movements: Extraocular movements intact.     Pupils: Pupils are equal, round, and reactive to light.  Cardiovascular:     Rate and Rhythm: Normal rate and regular rhythm.     Pulses: Normal pulses.     Heart sounds: Normal heart sounds. No murmur. No friction rub. No gallop.      Comments: Dorsalis pedis and posterior tibial pulses 2+ bilaterally. Pulmonary:     Effort: Pulmonary effort is normal. No respiratory distress.     Breath sounds: Normal breath sounds. No stridor. No wheezing, rhonchi or rales.  Musculoskeletal:        General: Swelling (trace, bilaterally in lower feet) present. No tenderness, deformity or signs of injury.     Comments: No warmth, erythema, calf tenderness.  Skin:    General: Skin is warm and dry.     Findings: No rash.  Neurological:     General: No focal deficit present.     Mental Status: She is alert and oriented to person, place, and time.  Psychiatric:         Mood and Affect: Mood normal.        Behavior: Behavior normal.        Thought Content: Thought content normal.    Results for orders placed or performed during the hospital encounter of 03/18/19 (from the past 24 hour(s))  Pregnancy, urine POC     Status: None   Collection Time: 03/18/19  4:49 PM  Result Value Ref Range   Preg Test, Ur NEGATIVE NEGATIVE    Assessment and Plan :   Amenorrhea  Bilateral lower extremity edema  Essential hypertension  Type 2 diabetes mellitus without complication, without long-term current use of insulin (HCC)  Obesity, unspecified classification, unspecified obesity type, unspecified whether serious comorbidity present  Mentally challenged  Patient's peripheral edema is trace at  best.  She has good pulses and overall physical exam findings are reassuring.  I obliged patient and did a urine pregnancy test which was negative.  Patient admits that she eats very unhealthy diet.  Counseled that I need her to avoid salt as an effort to combat fluid retention which may be the single source of her lower leg swelling.  Also recommended the use of compression stockings.  She is to follow-up with her PCP.  Strict ER and return to clinic precautions.     Jaynee Eagles, PA-C 03/18/19 1716    Jaynee Eagles, PA-C 03/18/19 1717

## 2019-04-10 ENCOUNTER — Other Ambulatory Visit: Payer: Self-pay

## 2019-04-10 ENCOUNTER — Encounter (HOSPITAL_COMMUNITY): Payer: Self-pay | Admitting: Emergency Medicine

## 2019-04-10 ENCOUNTER — Emergency Department (HOSPITAL_COMMUNITY)
Admission: EM | Admit: 2019-04-10 | Discharge: 2019-04-10 | Disposition: A | Discharge status: Home / Self Care | Payer: Medicare HMO | Attending: Emergency Medicine | Admitting: Emergency Medicine

## 2019-04-10 DIAGNOSIS — Z7984 Long term (current) use of oral hypoglycemic drugs: Secondary | ICD-10-CM | POA: Diagnosis not present

## 2019-04-10 DIAGNOSIS — G40219 Localization-related (focal) (partial) symptomatic epilepsy and epileptic syndromes with complex partial seizures, intractable, without status epilepticus: Secondary | ICD-10-CM | POA: Diagnosis not present

## 2019-04-10 DIAGNOSIS — R410 Disorientation, unspecified: Secondary | ICD-10-CM | POA: Diagnosis not present

## 2019-04-10 DIAGNOSIS — F70 Mild intellectual disabilities: Secondary | ICD-10-CM | POA: Insufficient documentation

## 2019-04-10 DIAGNOSIS — Z79899 Other long term (current) drug therapy: Secondary | ICD-10-CM | POA: Insufficient documentation

## 2019-04-10 DIAGNOSIS — I10 Essential (primary) hypertension: Secondary | ICD-10-CM | POA: Insufficient documentation

## 2019-04-10 DIAGNOSIS — E119 Type 2 diabetes mellitus without complications: Secondary | ICD-10-CM | POA: Diagnosis not present

## 2019-04-10 DIAGNOSIS — R569 Unspecified convulsions: Secondary | ICD-10-CM | POA: Diagnosis not present

## 2019-04-10 DIAGNOSIS — G40909 Epilepsy, unspecified, not intractable, without status epilepticus: Secondary | ICD-10-CM | POA: Diagnosis not present

## 2019-04-10 LAB — CBC WITH DIFFERENTIAL/PLATELET
Abs Immature Granulocytes: 0.03 10*3/uL (ref 0.00–0.07)
Basophils Absolute: 0 10*3/uL (ref 0.0–0.1)
Basophils Relative: 1 %
Eosinophils Absolute: 0.1 10*3/uL (ref 0.0–0.5)
Eosinophils Relative: 2 %
HCT: 32.3 % — ABNORMAL LOW (ref 36.0–46.0)
Hemoglobin: 10.1 g/dL — ABNORMAL LOW (ref 12.0–15.0)
Immature Granulocytes: 1 %
Lymphocytes Relative: 23 %
Lymphs Abs: 1.5 10*3/uL (ref 0.7–4.0)
MCH: 30.1 pg (ref 26.0–34.0)
MCHC: 31.3 g/dL (ref 30.0–36.0)
MCV: 96.1 fL (ref 80.0–100.0)
Monocytes Absolute: 0.4 10*3/uL (ref 0.1–1.0)
Monocytes Relative: 6 %
Neutro Abs: 4.4 10*3/uL (ref 1.7–7.7)
Neutrophils Relative %: 67 %
Platelets: 233 10*3/uL (ref 150–400)
RBC: 3.36 MIL/uL — ABNORMAL LOW (ref 3.87–5.11)
RDW: 15.5 % (ref 11.5–15.5)
WBC: 6.5 10*3/uL (ref 4.0–10.5)
nRBC: 0 % (ref 0.0–0.2)

## 2019-04-10 LAB — COMPREHENSIVE METABOLIC PANEL
ALT: 14 U/L (ref 0–44)
AST: 16 U/L (ref 15–41)
Albumin: 3.3 g/dL — ABNORMAL LOW (ref 3.5–5.0)
Alkaline Phosphatase: 82 U/L (ref 38–126)
Anion gap: 8 (ref 5–15)
BUN: 20 mg/dL (ref 6–20)
CO2: 25 mmol/L (ref 22–32)
Calcium: 8.1 mg/dL — ABNORMAL LOW (ref 8.9–10.3)
Chloride: 105 mmol/L (ref 98–111)
Creatinine, Ser: 1.09 mg/dL — ABNORMAL HIGH (ref 0.44–1.00)
GFR calc Af Amer: >60 mL/min (ref >60)
GFR calc non Af Amer: 59 mL/min — ABNORMAL LOW (ref >60)
Glucose, Bld: 136 mg/dL — ABNORMAL HIGH (ref 70–99)
Potassium: 3.7 mmol/L (ref 3.5–5.1)
Sodium: 138 mmol/L (ref 135–145)
Total Bilirubin: 0.2 mg/dL — ABNORMAL LOW (ref 0.3–1.2)
Total Protein: 7 g/dL (ref 6.5–8.1)

## 2019-04-10 LAB — CARBAMAZEPINE LEVEL, TOTAL: Carbamazepine Lvl: 2.1 ug/mL — ABNORMAL LOW (ref 4.0–12.0)

## 2019-04-10 MED ORDER — CARBAMAZEPINE ER 200 MG PO CP12: 200.0000 mg | ORAL_CAPSULE | Freq: Two times a day (BID) | ORAL | Status: DC

## 2019-04-10 MED ORDER — CARBAMAZEPINE 200 MG PO TABS
300.0000 mg | ORAL_TABLET | Freq: Once | ORAL | Status: AC
  Administered 2019-04-10: 10:00:00 300 mg via ORAL
  Filled 2019-04-10: qty 1.5

## 2019-04-10 MED ORDER — LORAZEPAM 2 MG/ML IJ SOLN
1.0000 mg | Freq: Once | INTRAMUSCULAR | Status: AC
  Administered 2019-04-10: 06:00:00 1 mg via INTRAVENOUS
  Filled 2019-04-10: qty 1

## 2019-04-10 NOTE — ED Provider Notes (Signed)
[facility] DEPT Provider Note   CSN: [id number] Arrival date & time: 04/10/19  6387    History   Chief Complaint Chief Complaint  Patient presents with  . Seizures    HPI Brandi Gomez is a 48 y.o. female.   The history is provided by the patient and the EMS personnel.  Seizures  She has a history of diabetes, hypertension, bipolar disorder, partial complex seizure disorder, sleep apnea, stroke and is brought in by ambulance after having a seizure at home.  She states that she was wearing her CPAP mask.  She has been compliant with her medications.  She feels she is back to her baseline.  Past Medical History:  Diagnosis Date  . Anemia   . Anxiety   . Bipolar 1 disorder ([place])   . Common migraine 05/19/2015  . Depression   . Diabetes mellitus, type II ([place])   . Hypertension   . Irritable bowel syndrome (IBS)   . Mild mental retardation   . Obesity   . Partial complex seizure disorder with intractable epilepsy ([place]) 05/12/2014  . Seizures ([place])    intractable, sz 08/23/17  . Sleep apnea   . Stroke ([place])   . Type II or unspecified type diabetes mellitus without mention of complication, not stated as uncontrolled     Patient Active Problem List   Diagnosis Date Noted  . History of recent stroke 08/06/2017  . OSA (obstructive sleep apnea)   . Cryptogenic stroke ([place]) 05/20/2017  . Controlled type 2 diabetes mellitus without complication, without long-term current use of insulin ([place]) 05/08/2017  . Essential hypertension 07/27/2008  . Convulsions ([place]) 02/05/2007    Past Surgical History:  Procedure Laterality Date  . COLONOSCOPY     2012-normal , Dr Sharlett Iles  . ESOPHAGOGASTRODUODENOSCOPY     normal-Dr Patterson 2012  . LOOP RECORDER INSERTION N/A 05/30/2017   Procedure: Loop Recorder Insertion;  Surgeon: Constance Haw, MD;  Location: [facility];  Service: Cardiovascular;  Laterality: N/A;  . LOOP RECORDER REMOVAL  N/A 03/04/2018   Procedure: LOOP RECORDER REMOVAL;  Surgeon: Constance Haw, MD;  Location: [facility];  Service: Cardiovascular;  Laterality: N/A;  . MYRINGOTOMY WITH TUBE PLACEMENT    . MYRINGOTOMY WITH TUBE PLACEMENT Right 11/05/2017   Procedure: MYRINGOTOMY WITH TUBE PLACEMENT;  Surgeon: Melissa Montane, MD;  Location: [facility];  Service: ENT;  Laterality: Right;  right T Tube placement  . NASAL SINUS SURGERY    . TEE WITHOUT CARDIOVERSION N/A 05/30/2017   Procedure: TRANSESOPHAGEAL ECHOCARDIOGRAM (TEE);  Surgeon: Acie Fredrickson Wonda Cheng, MD;  Location: [facility] END[facility]COPY;  Service: Cardiovascular;  Laterality: N/A;     OB History   No obstetric history on file.      Home Medications    Prior to Admission medications   Medication Sig Start Date End Date Taking? Authorizing Provider  acetaminophen (TYLENOL) 500 MG tablet Take 500 mg by mouth every 6 (six) hours as needed for headache.    [provider]  atorvastatin (LIPITOR) 10 MG tablet Take 1 tablet (10 mg total) by mouth daily at 6 PM. 12/23/17   Sagardia, Ines Bloomer, MD  carbamazepine (TEGRETOL) 200 MG tablet Take 1 tablet (200 mg total) by mouth 2 (two) times daily at 10 AM and 5 PM. 03/17/19 03/16/20  Plovsky, Berneta Sages, MD  carvedilol (COREG) 3.125 MG tablet Take 3.125 mg by mouth daily. 08/20/17   [provider]  DULoxetine (CYMBALTA) 30 MG  capsule Take 1 capsule (30 mg total) by mouth daily. 03/17/19   Plovsky, Berneta Sages, MD  lacosamide (VIMPAT) 200 MG TABS tablet Take 1 tablet (200 mg total) by mouth 2 (two) times daily. 12/06/18   [name], MD  sitaGLIPtin (JANUVIA) 50 MG tablet Take 1 tablet (50 mg total) by mouth daily. Schedule appointment for additional refills. 02/15/19   Horald Pollen, MD    Family History Family History  Problem Relation Age of Onset  . Diabetes Mother        passed away from accidental death  . Hypertension Mother   . Bipolar disorder Father   . Diabetes Daughter   . Leukemia  Daughter   . Ovarian cancer Maternal Aunt     Social History Social History   Tobacco Use  . Smoking status: Never Smoker  . Smokeless tobacco: Never Used  Substance Use Topics  . Alcohol use: No  . Drug use: No     Allergies   Amoxicillin; Lisinopril; and Hydrocodone   Review of Systems Review of Systems  Neurological: Positive for seizures.  All other systems reviewed and are negative.    Physical Exam Updated Vital Signs BP 130/65 (BP Location: Left Arm)   Pulse 76   Temp 97.9 F (36.6 C) (Oral)   Resp 15   SpO2 98%   Physical Exam Vitals signs and nursing note reviewed.    48 year old female, resting comfortably and in no acute distress. Vital signs are normal. Oxygen saturation is 98%, which is normal. Head is normocephalic and atraumatic. PERRLA, EOMI. Oropharynx is clear. Neck is nontender and supple without adenopathy or JVD. Back is nontender and there is no CVA tenderness. Lungs are clear without rales, wheezes, or rhonchi. Chest is nontender. Heart has regular rate and rhythm without murmur. Abdomen is soft, flat, nontender without masses or hepatosplenomegaly and peristalsis is normoactive. Extremities have trace edema, full range of motion is present. Skin is warm and dry without rash. Neurologic: Mental status is normal, cranial nerves are intact, there are no motor or sensory deficits.  ED Treatments / Results   Procedures Procedures  Medications Ordered in ED Medications  LORazepam (ATIVAN) injection 1 mg (1 mg Intravenous Given 04/10/19 0556)     Initial Impression / Assessment and Plan / ED Course  I have reviewed the triage vital signs and the nursing notes.  Breakthrough seizure.  Old records are reviewed, and she does have prior ED visits for seizures.  She had a phone consultation with her neurologist on January 31 about breakthrough seizures and her neurologist wanted her to come in for an appointment but she had refused.  She is  scheduled to see her neurologist in June.  No indication for lab testing today as her anticonvulsant does not have levels that we can get back in a timely fashion.  She will be referred back to her neurologist.  While being observed in the ED, patient had a second seizure.  She is given a dose of lorazepam.  Will check carbamazepine level.  I have attempted to contact her neurologist, but have been unsuccessful so far.  If carbamazepine level is subtherapeutic, will adjust the dose.  If carbamazepine is therapeutic, will plan to send home on clonazepam 0.5 mg twice a day for the next several days until she can contact her neurologist.  Case is signed out to Dr. Kathrynn Humble.  Final Clinical Impressions(s) / ED Diagnoses   Final diagnoses:  Seizure ([place])  ED Discharge Orders    None       Delora Fuel, MD 28/41/32 [phone]

## 2019-04-10 NOTE — Discharge Instructions (Addendum)
You are seen in the ER after he had a seizure.  Your medication level is low.  We recommend that you take 300 mg Tegretol in the morning and 200 mg Tegretol at night. This means that in the morning you will have to take 1.5 tablets and at night 1 tablet. Please follow-up with your neurologist soon as possible.

## 2019-04-10 NOTE — ED Triage Notes (Signed)
Pt comes to ed via ems, 2 witnessed seizures lasting 2 min's. Pt has a hx of seizures, takes her med's regularly. Pt was in bed during seizures, no witnessed or visibile injury. Vs on arrival 98 room air, rr22, 142/90, hr 80 cbg 135, temp 98.1.

## 2019-04-10 NOTE — ED Provider Notes (Signed)
  Physical Exam  BP 124/76 (BP Location: Left Arm)   Pulse 81   Temp 97.9 F (36.6 C) (Oral)   Resp 15   SpO2 93%   Physical Exam  ED Course/Procedures     Procedures  MDM  Assuming care of patient from Dr. Roxanne Mins.   Patient in the ED for break through seizures. She is on tegretol and vimpat.  Concerning findings are as following: none. Important pending results are tegretol levels   According to Dr. Roxanne Mins, plan is to await the tegretol level. If the levels are low then we will go up on Tegretol to a 500 mg total dose for the day.  If the levels are normal then she will be discharged with Klonopin for the next 2 to 3 days and advised to follow-up with her neurologist.  Patient had no complains, no concerns from the nursing side. Will continue to monitor.  Reassessment: Patient's Tegretol level is lower.  We will advise her to increase her morning dose to 300 mg.  Continued follow-up with her primary neurology recommended      Varney Biles, MD 04/11/19 [phone]

## 2019-04-10 NOTE — ED Notes (Signed)
NO RX GIVEN. INSTRUCTIONS FOR MEDICATION AT HOME GIVEN

## 2019-04-12 ENCOUNTER — Telehealth: Payer: Self-pay | Admitting: Neurology

## 2019-04-12 NOTE — Telephone Encounter (Signed)
I contacted the pt and was able to schedule a video visit with Dr. Jannifer Franklin on 04/13/19 at 11:00 am.   Pt understands that although there may be some limitations with this type of visit, we will take all precautions to reduce any security or privacy concerns.  Pt understands that this will be treated like an in office visit and we will file with pt's insurance, and there may be a patient responsible charge related to this service.  Pt's phone number is [phone]. Link for visit has been sent via text.  Pt is f/u on recent ED visit on 04/10/19 for breakthrough seizure event.

## 2019-04-12 NOTE — Telephone Encounter (Signed)
Pt returned call that was given to her about being seen sooner through a Virtual Visit. Pt stated that she does not know how to work [employer]. Please advise.

## 2019-04-13 ENCOUNTER — Other Ambulatory Visit: Payer: Self-pay

## 2019-04-13 ENCOUNTER — Ambulatory Visit (INDEPENDENT_AMBULATORY_CARE_PROVIDER_SITE_OTHER): Payer: Medicare HMO | Admitting: Neurology

## 2019-04-13 ENCOUNTER — Encounter: Payer: Self-pay | Admitting: Neurology

## 2019-04-13 DIAGNOSIS — G40909 Epilepsy, unspecified, not intractable, without status epilepticus: Secondary | ICD-10-CM

## 2019-04-13 DIAGNOSIS — Z5181 Encounter for therapeutic drug level monitoring: Secondary | ICD-10-CM | POA: Diagnosis not present

## 2019-04-13 MED ORDER — LACOSAMIDE 200 MG PO TABS: 200.0000 mg | ORAL_TABLET | Freq: Two times a day (BID) | ORAL | 1 refills | Status: DC

## 2019-04-13 NOTE — Addendum Note (Signed)
Addended by: Inis Sizer D on: 04/13/2019 01:25 PM   Modules accepted: Orders

## 2019-04-13 NOTE — Progress Notes (Signed)
     Virtual Visit via Video Note  I connected with Brandi Gomez on 04/13/19 at 11:00 AM EDT by a video enabled telemedicine application and verified that I am speaking with the correct person using two identifiers.  Location: Patient: The patient is at home. Provider: Physician in office.   I discussed the limitations of evaluation and management by telemedicine and the availability of in person appointments. The patient expressed understanding and agreed to proceed.  History of Present Illness: Brandi Gomez is a 48 year old right-handed black female with a history of an intractable seizure disorder.  She was taken off of [place] recently because of some problems with psychosis, and she was placed on Vimpat.  She is followed by Dr. Casimiro Needle from psychiatry, at some point she has been placed on carbamazepine taking 200 mg twice daily.  She went to the emergency room on 10 Apr 2019 with a seizure event that occurred while sleeping.  The patient indicates that she had another seizure about a week prior to this and did not go to the hospital.  The patient is unsure about her medications, but she believes that she may have missed a few doses of the carbamazepine.  She has not missed Vimpat.  The carbamazepine levels in the emergency room were low.  They did not check Vimpat levels.  The patient does not operate a motor vehicle.  She does report some swelling in the ankles recently.  She denies any drowsiness or gait instability on the medications.   Observations/Objective: The video evaluation reveals the patient is alert and cooperative.  She has full extraocular movements.  Speech is well enunciated, not aphasic or dysarthric.  She is answering questions appropriately.  She has good protrusion of the tongue in the midline with good lateral movement of the tongue.  She has good finger-nose-finger and heel shin bilaterally.  Gait is unremarkable, tandem gait is normal.  Romberg is negative.   Assessment and Plan: 1.  History of intractable seizures  The patient will be brought in for blood work to check levels of Vimpat and carbamazepine.  We may adjust medications depend upon the results of the above studies.  She will follow-up in 3 months for an evaluation.  Follow Up Instructions: 74-month follow-up, may see nurse practitioner.   I discussed the assessment and treatment plan with the patient. The patient was provided an opportunity to ask questions and all were answered. The patient agreed with the plan and demonstrated an understanding of the instructions.   The patient was advised to call back or seek an in-person evaluation if the symptoms worsen or if the condition fails to improve as anticipated.  I provided 20 minutes of non-face-to-face time during this encounter.   [name], MD

## 2019-04-15 ENCOUNTER — Telehealth: Payer: Self-pay

## 2019-04-15 ENCOUNTER — Other Ambulatory Visit: Payer: Self-pay

## 2019-04-15 ENCOUNTER — Ambulatory Visit (HOSPITAL_COMMUNITY)
Admission: EM | Admit: 2019-04-15 | Discharge: 2019-04-15 | Disposition: A | Discharge status: Home / Self Care | Payer: Medicare HMO | Attending: Family Medicine | Admitting: Family Medicine

## 2019-04-15 DIAGNOSIS — R600 Localized edema: Secondary | ICD-10-CM

## 2019-04-15 DIAGNOSIS — M545 Low back pain: Secondary | ICD-10-CM | POA: Diagnosis not present

## 2019-04-15 DIAGNOSIS — G4733 Obstructive sleep apnea (adult) (pediatric): Secondary | ICD-10-CM | POA: Diagnosis not present

## 2019-04-15 DIAGNOSIS — R569 Unspecified convulsions: Secondary | ICD-10-CM | POA: Diagnosis not present

## 2019-04-15 LAB — CARBAMAZEPINE LEVEL, TOTAL: Carbamazepine (Tegretol), S: 5.2 ug/mL (ref 4.0–12.0)

## 2019-04-15 LAB — LACOSAMIDE: Lacosamide: 10.8 ug/mL — ABNORMAL HIGH (ref 5.0–10.0)

## 2019-04-15 MED ORDER — TORSEMIDE 10 MG PO TABS: 10.0000 mg | ORAL_TABLET | Freq: Every day | ORAL | 0 refills | Status: DC

## 2019-04-15 NOTE — Discharge Instructions (Addendum)
Take the torsemide once a day I am giving you one week Avoid salt Elevate legs when swolllen  Swelling in your ankles is a chronic problem for you When it worsens, you should call your family doctor for instructions Your doctor will tell you when you need to come to the urgent care or emergency room

## 2019-04-15 NOTE — Telephone Encounter (Signed)
I contacted the pt and was able to advise on lab results. Pt verbalized understanding.

## 2019-04-15 NOTE — Telephone Encounter (Signed)
-----   Message from [name], MD sent at 04/15/2019  7:01 AM EDT ----- Blood work reveals therapeutic levels of carbamazepine at this point, prior level was low.  A random blood level of Vimpat was slightly elevated, no change in dosing of carbamazepine or Vimpat, continue on these medications.  Please call the patient, make sure that she stays on both of these medications, carbamazepine is now therapeutic. ----- Message ----- From: Interface, [facility] Lab Results In Sent: 04/14/2019   7:37 AM EDT To: [name], MD

## 2019-04-15 NOTE — ED Provider Notes (Signed)
[facility]    CSN: [id number] Arrival date & time: 04/15/19  1152     History   Chief Complaint Chief Complaint  Patient presents with  . Leg Pain    HPI Brandi Gomez is a 48 y.o. female.   HPI  Patient states she is here for increased pedal edema.  Patient is not a real good historian.  She has a history of mental illness.  She also has some mild mental impairment.  She is here by herself.  She tells me that her ankles are swollen, and uncomfortable.  As I reviewed her chart I see that she is to be on torsemide for pedal edema.  This is a chronic complaint.  This was discontinued in December because patient was noncompliant.  She also had some renal impairment.  It was felt it was not safe to continue on torsemide as she may have some electrolyte abnormalities or worsening kidney failure. I am unable to determine whether patient has been eating more salt.  I know she has been less active because of the COVID-19 epidemic.  She does not have compression stockings.  She does not elevate her feet properly, above the level of her heart.  No chest pain or shortness of breath.  She denies heart disease or heart failure.   Past Medical History:  Diagnosis Date  . Anemia   . Anxiety   . Bipolar 1 disorder ([place])   . Common migraine 05/19/2015  . Depression   . Diabetes mellitus, type II ([place])   . Hypertension   . Irritable bowel syndrome (IBS)   . Mild mental retardation   . Obesity   . Partial complex seizure disorder with intractable epilepsy ([place]) 05/12/2014  . Seizures ([place])    intractable, sz 08/23/17  . Sleep apnea   . Stroke ([place])   . Type II or unspecified type diabetes mellitus without mention of complication, not stated as uncontrolled     Patient Active Problem List   Diagnosis Date Noted  . History of recent stroke 08/06/2017  . OSA (obstructive sleep apnea)   . Cryptogenic stroke ([place]) 05/20/2017  . Controlled type 2 diabetes mellitus without  complication, without long-term current use of insulin ([place]) 05/08/2017  . Essential hypertension 07/27/2008  . Seizure disorder ([place]) 02/05/2007    Past Surgical History:  Procedure Laterality Date  . COLONOSCOPY     2012-normal , Dr Sharlett Iles  . ESOPHAGOGASTRODUODENOSCOPY     normal-Dr Patterson 2012  . LOOP RECORDER INSERTION N/A 05/30/2017   Procedure: Loop Recorder Insertion;  Surgeon: Constance Haw, MD;  Location: [facility];  Service: Cardiovascular;  Laterality: N/A;  . LOOP RECORDER REMOVAL N/A 03/04/2018   Procedure: LOOP RECORDER REMOVAL;  Surgeon: Constance Haw, MD;  Location: [facility];  Service: Cardiovascular;  Laterality: N/A;  . MYRINGOTOMY WITH TUBE PLACEMENT    . MYRINGOTOMY WITH TUBE PLACEMENT Right 11/05/2017   Procedure: MYRINGOTOMY WITH TUBE PLACEMENT;  Surgeon: Melissa Montane, MD;  Location: [facility];  Service: ENT;  Laterality: Right;  right T Tube placement  . NASAL SINUS SURGERY    . TEE WITHOUT CARDIOVERSION N/A 05/30/2017   Procedure: TRANSESOPHAGEAL ECHOCARDIOGRAM (TEE);  Surgeon: Acie Fredrickson Wonda Cheng, MD;  Location: [facility] END[facility];  Service: Cardiovascular;  Laterality: N/A;    OB History   No obstetric history on file.      Home Medications    Prior to Admission medications   Medication Sig Start  Date End Date Taking? Authorizing Provider  acetaminophen (TYLENOL) 500 MG tablet Take 500 mg by mouth every 6 (six) hours as needed for headache.    [provider]  atorvastatin (LIPITOR) 10 MG tablet Take 1 tablet (10 mg total) by mouth daily at 6 PM. 12/23/17   Sagardia, Ines Bloomer, MD  carbamazepine (TEGRETOL) 200 MG tablet Take 1 tablet (200 mg total) by mouth 2 (two) times daily at 10 AM and 5 PM. 03/17/19 03/16/20  Plovsky, Berneta Sages, MD  carvedilol (COREG) 3.125 MG tablet Take 3.125 mg by mouth daily. 08/20/17   [provider]  DULoxetine (CYMBALTA) 30 MG capsule Take 1 capsule (30 mg total) by mouth daily. 03/17/19    Plovsky, Berneta Sages, MD  lacosamide (VIMPAT) 200 MG TABS tablet Take 1 tablet (200 mg total) by mouth 2 (two) times daily. 04/13/19   [name], MD  sitaGLIPtin (JANUVIA) 50 MG tablet Take 1 tablet (50 mg total) by mouth daily. Schedule appointment for additional refills. 02/15/19   Horald Pollen, MD  torsemide (DEMADEX) 10 MG tablet Take 1 tablet (10 mg total) by mouth daily. 04/15/19   Raylene Everts, MD    Family History Family History  Problem Relation Age of Onset  . Diabetes Mother        passed away from accidental death  . Hypertension Mother   . Bipolar disorder Father   . Diabetes Daughter   . Leukemia Daughter   . Ovarian cancer Maternal Aunt     Social History Social History   Tobacco Use  . Smoking status: Never Smoker  . Smokeless tobacco: Never Used  Substance Use Topics  . Alcohol use: No  . Drug use: No     Allergies   Amoxicillin; Lisinopril; and Hydrocodone   Review of Systems Review of Systems  Constitutional: Negative for chills, fatigue and fever.  HENT: Negative for ear pain and sore throat.   Eyes: Negative for pain and visual disturbance.  Respiratory: Negative for cough and shortness of breath.   Cardiovascular: Positive for leg swelling. Negative for chest pain and palpitations.  Gastrointestinal: Negative for abdominal pain and vomiting.  Genitourinary: Negative for dysuria and hematuria.  Musculoskeletal: Negative for arthralgias and back pain.  Skin: Negative for color change and rash.  Neurological: Negative for seizures and syncope.  All other systems reviewed and are negative.    Physical Exam Triage Vital Signs ED Triage Vitals  Enc Vitals Group     BP 04/15/19 1240 127/75     Pulse Rate 04/15/19 1239 72     Resp 04/15/19 1239 18     Temp 04/15/19 1239 98.9 F (37.2 C)     Temp src --      SpO2 04/15/19 1239 100 %   No data found.  Updated Vital Signs BP 127/75   Pulse 72   Temp 98.9 F (37.2 C)   Resp 18    SpO2 100%      Physical Exam Constitutional:      General: She is not in acute distress.    Appearance: She is well-developed. She is obese.     Comments: Patient has a rather bland affect.  Slow to respond to questions.  Poor fund of knowledge  HENT:     Head: Normocephalic and atraumatic.  Eyes:     Conjunctiva/sclera: Conjunctivae normal.     Pupils: Pupils are equal, round, and reactive to light.  Neck:     Musculoskeletal: Normal range  of motion.  Cardiovascular:     Rate and Rhythm: Normal rate and regular rhythm.     Heart sounds: Normal heart sounds.  Pulmonary:     Effort: Pulmonary effort is normal. No respiratory distress.     Breath sounds: Normal breath sounds. No rales.  Abdominal:     General: There is no distension.     Palpations: Abdomen is soft.  Musculoskeletal: Normal range of motion.     Right lower leg: Edema present.     Left lower leg: Edema present.     Comments: 2-3+ pitting edema both ankles.  Pedal pulses good.  Cap refill normal.  Skin:    General: Skin is warm and dry.  Neurological:     Mental Status: She is alert.      UC Treatments / Results  Labs (all labs ordered are listed, but only abnormal results are displayed) Labs Reviewed  BASIC METABOLIC PANEL    EKG None  Radiology No results found.  Procedures Procedures (including critical care time)  Medications Ordered in UC Medications - No data to display  Initial Impression / Assessment and Plan / UC Course  I have reviewed the triage vital signs and the nursing notes.  Pertinent labs & imaging results that were available during my care of the patient were reviewed by me and considered in my medical decision making (see chart for details).     I tried to explain to the patient that it was preferable to stay at home during the COVID 19 crisis.  She came in without a mask and she is at increased risk of COVID complications.  I advised her to call her primary care  doctor's office for advice, and they would let her know when she needs to come to the urgent care, or whether she can be managed by telephone.  I could not determine the patient processed and received this message, when I asked her to repeat it to me she states "sometimes I cough a lot too" Final Clinical Impressions(s) / UC Diagnoses   Final diagnoses:  Pedal edema     Discharge Instructions     Take the torsemide once a day I am giving you one week I am getting a blood test to check your kidneys Avoid salt Elevate legs when swolllen  Swelling in your ankles is a chronic problem for you When it worsens, you should call your family doctor for instructions Your doctor will tell you when you need to come to the urgent care or emergency room   ED Prescriptions    Medication Sig Dispense Auth. Provider   torsemide (DEMADEX) 10 MG tablet Take 1 tablet (10 mg total) by mouth daily. 7 tablet Raylene Everts, MD     Controlled Substance Prescriptions [place] Controlled Substance Registry consulted? Not Applicable   Raylene Everts, MD 04/15/19 1504

## 2019-04-15 NOTE — ED Triage Notes (Signed)
Pt here for bilateral leg swelling and pain x3 days. No injury. No obvious swelling or deformities. Pt ambulatory.

## 2019-04-16 ENCOUNTER — Ambulatory Visit (HOSPITAL_COMMUNITY): Payer: Medicare HMO | Admitting: Psychiatry

## 2019-04-17 ENCOUNTER — Other Ambulatory Visit: Payer: Self-pay

## 2019-04-17 ENCOUNTER — Encounter (HOSPITAL_COMMUNITY): Payer: Self-pay

## 2019-04-17 ENCOUNTER — Emergency Department (HOSPITAL_COMMUNITY)
Admission: EM | Admit: 2019-04-17 | Discharge: 2019-04-17 | Disposition: A | Discharge status: Home / Self Care | Payer: Medicare HMO | Attending: Emergency Medicine | Admitting: Emergency Medicine

## 2019-04-17 DIAGNOSIS — G40909 Epilepsy, unspecified, not intractable, without status epilepticus: Secondary | ICD-10-CM | POA: Diagnosis not present

## 2019-04-17 DIAGNOSIS — Z8673 Personal history of transient ischemic attack (TIA), and cerebral infarction without residual deficits: Secondary | ICD-10-CM | POA: Diagnosis not present

## 2019-04-17 DIAGNOSIS — Z7984 Long term (current) use of oral hypoglycemic drugs: Secondary | ICD-10-CM | POA: Insufficient documentation

## 2019-04-17 DIAGNOSIS — I10 Essential (primary) hypertension: Secondary | ICD-10-CM | POA: Diagnosis not present

## 2019-04-17 DIAGNOSIS — E119 Type 2 diabetes mellitus without complications: Secondary | ICD-10-CM | POA: Diagnosis not present

## 2019-04-17 DIAGNOSIS — R569 Unspecified convulsions: Secondary | ICD-10-CM | POA: Diagnosis not present

## 2019-04-17 DIAGNOSIS — Z79899 Other long term (current) drug therapy: Secondary | ICD-10-CM | POA: Diagnosis not present

## 2019-04-17 DIAGNOSIS — F99 Mental disorder, not otherwise specified: Secondary | ICD-10-CM | POA: Diagnosis not present

## 2019-04-17 DIAGNOSIS — R404 Transient alteration of awareness: Secondary | ICD-10-CM | POA: Diagnosis not present

## 2019-04-17 DIAGNOSIS — R410 Disorientation, unspecified: Secondary | ICD-10-CM | POA: Diagnosis not present

## 2019-04-17 LAB — COMPREHENSIVE METABOLIC PANEL
ALT: 10 U/L (ref 0–44)
AST: 21 U/L (ref 15–41)
Albumin: 3.4 g/dL — ABNORMAL LOW (ref 3.5–5.0)
Alkaline Phosphatase: 74 U/L (ref 38–126)
Anion gap: 9 (ref 5–15)
BUN: 19 mg/dL (ref 6–20)
CO2: 26 mmol/L (ref 22–32)
Calcium: 8.2 mg/dL — ABNORMAL LOW (ref 8.9–10.3)
Chloride: 103 mmol/L (ref 98–111)
Creatinine, Ser: 1.26 mg/dL — ABNORMAL HIGH (ref 0.44–1.00)
GFR calc Af Amer: 58 mL/min — ABNORMAL LOW (ref >60)
GFR calc non Af Amer: 50 mL/min — ABNORMAL LOW (ref >60)
Glucose, Bld: 103 mg/dL — ABNORMAL HIGH (ref 70–99)
Potassium: 4.6 mmol/L (ref 3.5–5.1)
Sodium: 138 mmol/L (ref 135–145)
Total Bilirubin: 0.6 mg/dL (ref 0.3–1.2)
Total Protein: 7.3 g/dL (ref 6.5–8.1)

## 2019-04-17 LAB — CBC WITH DIFFERENTIAL/PLATELET
Abs Immature Granulocytes: 0.02 10*3/uL (ref 0.00–0.07)
Basophils Absolute: 0 10*3/uL (ref 0.0–0.1)
Basophils Relative: 0 %
Eosinophils Absolute: 0.2 10*3/uL (ref 0.0–0.5)
Eosinophils Relative: 3 %
HCT: 33.4 % — ABNORMAL LOW (ref 36.0–46.0)
Hemoglobin: 10.6 g/dL — ABNORMAL LOW (ref 12.0–15.0)
Immature Granulocytes: 0 %
Lymphocytes Relative: 29 %
Lymphs Abs: 1.7 10*3/uL (ref 0.7–4.0)
MCH: 29.9 pg (ref 26.0–34.0)
MCHC: 31.7 g/dL (ref 30.0–36.0)
MCV: 94.4 fL (ref 80.0–100.0)
Monocytes Absolute: 0.4 10*3/uL (ref 0.1–1.0)
Monocytes Relative: 8 %
Neutro Abs: 3.4 10*3/uL (ref 1.7–7.7)
Neutrophils Relative %: 60 %
Platelets: 226 10*3/uL (ref 150–400)
RBC: 3.54 MIL/uL — ABNORMAL LOW (ref 3.87–5.11)
RDW: 15.2 % (ref 11.5–15.5)
WBC: 5.8 10*3/uL (ref 4.0–10.5)
nRBC: 0 % (ref 0.0–0.2)

## 2019-04-17 LAB — CARBAMAZEPINE LEVEL, TOTAL: Carbamazepine Lvl: 4.2 ug/mL (ref 4.0–12.0)

## 2019-04-17 MED ORDER — LORAZEPAM 2 MG/ML IJ SOLN
1.0000 mg | Freq: Once | INTRAMUSCULAR | Status: AC
  Administered 2019-04-17: 05:00:00 1 mg via INTRAVENOUS
  Filled 2019-04-17: qty 1

## 2019-04-17 MED ORDER — CARBAMAZEPINE 200 MG PO TABS
200.0000 mg | ORAL_TABLET | Freq: Once | ORAL | Status: AC
  Administered 2019-04-17: 200 mg via ORAL
  Filled 2019-04-17: qty 1

## 2019-04-17 NOTE — ED Provider Notes (Signed)
[facility] DEPT Provider Note  CSN: [id number] Arrival date & time: 04/17/19 0220  Chief Complaint(s) Seizures ED Triage Notes  Beaulaurier, Helane Gunther, RN (Registered Nurse)   Emergency Medicine   04/17/2019 2:27 AM   Signed    Pt was lying in bed when seizure took place, no fall.     ED Triage Notes  Beaulaurier, Helane Gunther, RN (Registered Nurse)   Emergency Medicine   04/17/2019 2:22 AM   Signed    Patient BIB [facility] from home after having seizure lasting about 3-4 minutes. Pt has hx of seizures and is compliant with meds. pts roommate reported to EMS that pt typically has another seizure about an hour or so after the initial seizure. Pt was postictal -  a/o x2 when EMS arrived then a/o x4 within about 10 minutes.       HPI Brandi Gomez is a 48 y.o. female with extensive past medical history listed below including epilepsy on Vimpat and Tegretol who presents to the emergency department after a seizure episode at home.  Per EMS patient was postictal.  Now back to baseline.  Patient reports that she did not feel the seizure, none.  She is unsure of her medication dosing stating that her daughter at home manages her medications.  Currently denies any complaints.  She denies any recent fevers or infections.  Reports that she believes she takes her medicines as prescribed.    HPI  Past Medical History Past Medical History:  Diagnosis Date   Anemia    Anxiety    Bipolar 1 disorder ([place])    Common migraine 05/19/2015   Depression    Diabetes mellitus, type II ([place])    Hypertension    Irritable bowel syndrome (IBS)    Mild mental retardation    Obesity    Partial complex seizure disorder with intractable epilepsy ([place]) 05/12/2014   Seizures ([place])    intractable, sz 08/23/17   Sleep apnea    Stroke ([place])    Type II or unspecified type diabetes mellitus without mention of complication, not stated as uncontrolled    Patient  Active Problem List   Diagnosis Date Noted   History of recent stroke 08/06/2017   OSA (obstructive sleep apnea)    Cryptogenic stroke ([employer]) 05/20/2017   Controlled type 2 diabetes mellitus without complication, without long-term current use of insulin ([place]) 05/08/2017   Essential hypertension 07/27/2008   Seizure disorder ([place]) 02/05/2007   Home Medication(s) Prior to Admission medications   Medication Sig Start Date End Date Taking? Authorizing Provider  atorvastatin (LIPITOR) 10 MG tablet Take 1 tablet (10 mg total) by mouth daily at 6 PM. 12/23/17  Yes Sagardia, Ines Bloomer, MD  carbamazepine (TEGRETOL) 200 MG tablet Take 1 tablet (200 mg total) by mouth 2 (two) times daily at 10 AM and 5 PM. 03/17/19 03/16/20 Yes Plovsky, Berneta Sages, MD  carvedilol (COREG) 3.125 MG tablet Take 3.125 mg by mouth daily. 08/20/17  Yes [provider]  DULoxetine (CYMBALTA) 30 MG capsule Take 1 capsule (30 mg total) by mouth daily. 03/17/19  Yes Plovsky, Berneta Sages, MD  lacosamide (VIMPAT) 200 MG TABS tablet Take 1 tablet (200 mg total) by mouth 2 (two) times daily. 04/13/19  Yes [name], MD  sitaGLIPtin (JANUVIA) 50 MG tablet Take 1 tablet (50 mg total) by mouth daily. Schedule appointment for additional refills. 02/15/19  Yes Horald Pollen, MD  torsemide (DEMADEX) 10 MG tablet Take 1 tablet (10 mg  total) by mouth daily. 04/15/19  Yes Raylene Everts, MD                                                                                                                                    Past Surgical History Past Surgical History:  Procedure Laterality Date   COLONOSCOPY     2012-normal , Dr Sharlett Iles   ESOPHAGOGASTRODUODENOSCOPY     normal-Dr Patterson 2012   LOOP RECORDER INSERTION N/A 05/30/2017   Procedure: Loop Recorder Insertion;  Surgeon: Constance Haw, MD;  Location: [facility];  Service: Cardiovascular;  Laterality: N/A;   LOOP RECORDER REMOVAL N/A 03/04/2018    Procedure: LOOP RECORDER REMOVAL;  Surgeon: Constance Haw, MD;  Location: [facility];  Service: Cardiovascular;  Laterality: N/A;   MYRINGOTOMY WITH TUBE PLACEMENT     MYRINGOTOMY WITH TUBE PLACEMENT Right 11/05/2017   Procedure: MYRINGOTOMY WITH TUBE PLACEMENT;  Surgeon: Melissa Montane, MD;  Location: [facility];  Service: ENT;  Laterality: Right;  right T Tube placement   NASAL SINUS SURGERY     TEE WITHOUT CARDIOVERSION N/A 05/30/2017   Procedure: TRANSESOPHAGEAL ECHOCARDIOGRAM (TEE);  Surgeon: Thayer Headings, MD;  Location: [facility]COPY;  Service: Cardiovascular;  Laterality: N/A;   Family History Family History  Problem Relation Age of Onset   Diabetes Mother        passed away from accidental death   Hypertension Mother    Bipolar disorder Father    Diabetes Daughter    Leukemia Daughter    Ovarian cancer Maternal Aunt     Social History Social History   Tobacco Use   Smoking status: Never Smoker   Smokeless tobacco: Never Used  Substance Use Topics   Alcohol use: No   Drug use: No   Allergies Amoxicillin; Lisinopril; and Hydrocodone  Review of Systems Review of Systems All other systems are reviewed and are negative for acute change except as noted in the HPI  Physical Exam Vital Signs  I have reviewed the triage vital signs BP 132/73    Pulse (!) 55    Temp 98.5 F (36.9 C) (Oral)    Resp 18    Ht 5\' 1"  (1.549 m)    Wt 123.4 kg    SpO2 100%    BMI 51.39 kg/m   Physical Exam Vitals signs reviewed.  Constitutional:      General: She is not in acute distress.    Appearance: She is well-developed. She is obese. She is not diaphoretic.  HENT:     Head: Normocephalic and atraumatic.     Nose: Nose normal.  Eyes:     General: No scleral icterus.       Right eye: No discharge.        Left eye: No discharge.     Conjunctiva/sclera: Conjunctivae normal.     Pupils: Pupils are equal, round, and reactive  to light.  Neck:      Musculoskeletal: Normal range of motion and neck supple.  Cardiovascular:     Rate and Rhythm: Normal rate and regular rhythm.     Heart sounds: No murmur. No friction rub. No gallop.   Pulmonary:     Effort: Pulmonary effort is normal. No respiratory distress.     Breath sounds: Normal breath sounds. No stridor. No rales.  Abdominal:     General: There is no distension.     Palpations: Abdomen is soft.     Tenderness: There is no abdominal tenderness.  Musculoskeletal:        General: No tenderness.  Skin:    General: Skin is warm and dry.     Findings: No erythema or rash.  Neurological:     Mental Status: She is alert and oriented to person, place, and time.     Sensory: Sensation is intact.     Motor: Motor function is intact.     ED Results and Treatments Labs (all labs ordered are listed, but only abnormal results are displayed) Labs Reviewed  CBC WITH DIFFERENTIAL/PLATELET - Abnormal; Notable for the following components:      Result Value   RBC 3.54 (*)    Hemoglobin 10.6 (*)    HCT 33.4 (*)    All other components within normal limits  COMPREHENSIVE METABOLIC PANEL - Abnormal; Notable for the following components:   Glucose, Bld 103 (*)    Creatinine, Ser 1.26 (*)    Calcium 8.2 (*)    Albumin 3.4 (*)    GFR calc non Af Amer 50 (*)    GFR calc Af Amer 58 (*)    All other components within normal limits  CARBAMAZEPINE LEVEL, TOTAL                                                                                                                         EKG  EKG Interpretation  Date/Time:    Ventricular Rate:    PR Interval:    QRS Duration:   QT Interval:    QTC Calculation:   R Axis:     Text Interpretation:        Radiology No results found. Pertinent labs & imaging results that were available during my care of the patient were reviewed by me and considered in my medical decision making (see chart for details).  Medications Ordered in ED Medications   LORazepam (ATIVAN) injection 1 mg (1 mg Intravenous Given 04/17/19 0520)  Procedures Procedures  (including critical care time)  Medical Decision Making / ED Course I have reviewed the nursing notes for this encounter and the patient's prior records (if available in EHR or on provided paperwork).     Review of records, patient was seen here on May 2 for breakthrough seizure and noted to have a subtherapeutic carbamazepine level.  At that time she was instructed to increase her morning dose to 300 mg.  I spoke with patient's daughter and boyfriend at home who reported that they were unaware of this change and have been given her regular dosing of 200 mg twice daily.  They did report that the patient took her afternoon dose late today.  Patient had a telephone visit with her neurologist earlier this week and no medication changes were made.  Screening labs were obtained and close to patient's baseline.  Awaiting carbamazepine level.  Provided with small fluid bolus.  During her stay, patient had another seizure and became postictal.  This was unwitnessed.  She was given 1 mg of Ativan. Will continue to monitor while awaiting carbamazepine level.  5:15 AM Back to baseline.  6:35 AM Still stable. Still awaiting Tegretol level. If subtherapeutic, will need to increase morning dose to 300 mg and discuss this with Neurologist.    Patient care turned over to Dr Tomi Bamberger at 0730. Patient case and results discussed in detail; please see their note for further ED managment.      Final Clinical Impression(s) / ED Diagnoses Final diagnoses:  Seizure [facility])      This chart was dictated using voice recognition software.  Despite best efforts to proofread,  errors can occur which can change the documentation meaning.   Fatima Blank, MD 04/17/19 [phone]

## 2019-04-17 NOTE — Discharge Instructions (Addendum)
Follow-up with your neurologist, continue your current medications, return as needed for recurrent episodes

## 2019-04-17 NOTE — ED Triage Notes (Signed)
Patient BIB [facility] from home after having seizure lasting about 3-4 minutes. Pt has hx of seizures and is compliant with meds. pts roommate reported to EMS that pt typically has another seizure about an hour or so after the initial seizure. Pt was postictal -  a/o x2 when EMS arrived then a/o x4 within about 10 minutes.

## 2019-04-17 NOTE — Progress Notes (Signed)
[name] TOC CM -referral 5 ED visit in 6 months   Contacted [facility] for [facility] and SW (PCS, ALF)Britnee Mcdevitt [employer] RN CCM Case Mgmt phone [phone]

## 2019-04-17 NOTE — TOC Initial Note (Addendum)
Transition of Care [facility]) - Initial/Assessment Note    Patient Details  Name: Brandi Gomez MRN: [id number] Date of Birth: 1970-12-12  [facility] CM/SW Contact:    Erenest Rasher, RN Phone Number: 04/17/2019, 12:56 PM  Clinical Narrative:    [name] TOC CM -referral 5 ED visit in 6 months, HH              Spoke to pt and states she lives with her daughter and boyfriend. States they try to assist her at home. States she can afford meds but here memory is bad and she is forgetful. Her boyfriend and daughter tries to assist her at home by putting in pill planner. States she is agreeable to go back home with [facility]) which is covered under her Medicaid. But her long term goal is ALF. Offered choice for [facility] RN (medication complaince) and SW (PCS, ALF). Pt agreeable to [facility] for [facility]. [facility] referral pending.     Expected Discharge Plan: [place] Barriers to Discharge: No Barriers Identified   Patient Goals and CMS Choice Patient states their goals for this hospitalization and ongoing recovery are:: want to go to ALF CMS Medicare.gov Compare Post Acute Care list provided to:: Patient Choice offered to / list presented to : Patient  Expected Discharge Plan and Services Expected Discharge Plan: [place] In-house Referral: [facility] Discharge Planning Services: CM Consult Post Acute Care Choice: [place] arrangements for the past 2 months: Single Family Home                 DME Arranged: N/A DME Agency: NA       HH Arranged: RN, Social Work [place] Agency: [facility] Date [place]: 04/17/19 Time [place]: 1252 Representative spoke with at [place]: Glyn Ade  Prior Living Arrangements/Services Living arrangements for the past 2 months: [place] with:: Adult Children, Significant Other Patient language and need for interpreter reviewed:: Yes Do  you feel safe going back to the place where you live?: Yes      Need for Family Participation in Patient Care: Yes (Comment) Care giver support system in place?: Yes (comment)   Criminal Activity/Legal Involvement Pertinent to Current Situation/Hospitalization: No - Comment as needed  Activities of Daily Living      Permission Sought/Granted Permission sought to share information with : Case Manager, PCP, Family Supports Permission granted to share information with : Yes, Verbal Permission Granted  Share Information with NAME: Agnes Lawrence, [place] granted to share info w AGENCY: [facility]  Permission granted to share info w Relationship: daughter, boyfriend  Permission granted to share info w Contact Information: [phone]  Emotional Assessment   Attitude/Demeanor/Rapport: Engaged, Gracious Affect (typically observed): Accepting Orientation: : Oriented to Self, Oriented to Place, Oriented to  Time, Oriented to Situation   Psych Involvement: No (comment)  Admission diagnosis:  seizure Patient Active Problem List   Diagnosis Date Noted  . History of recent stroke 08/06/2017  . OSA (obstructive sleep apnea)   . Cryptogenic stroke ([place]) 05/20/2017  . Controlled type 2 diabetes mellitus without complication, without long-term current use of insulin ([place]) 05/08/2017  . Essential hypertension 07/27/2008  . Seizure disorder ([place]) 02/05/2007   PCP:  Horald Pollen, MD Pharmacy[facility]   [id number] [address], [place]  [place] [address] Phone: [phone] Fax: [phone]     Social Determinants of Health (SDOH) Interventions    Readmission Risk Interventions No flowsheet data found.

## 2019-04-17 NOTE — Progress Notes (Signed)
     Home health agencies that serve [phone]. Your favorite home health agencies  [place] of Patient Care Rating Patient Survey Summary Rating  [place]  [phone] 3 out of 5 stars 5 out of [place]  [phone] 3 out of 5 stars 4 out of [place]  [phone] 4  out of 5 stars 3 out of [place]  [phone] 4 out of 5 stars 4 out of [place]  [phone] 4 out of 5 stars 4 out of 5 stars  [facility] [place]  [phone] 3  out of 5 stars 4 out of [place]  [phone] 3 out of 5 stars 4 out of 5 stars  [facility]  [phone] 4 out of 5 stars Not Available  [facility]  [phone] 3  out of 5 stars 3 out of [place]  [phone] 3  out of 5 stars 3 out of [place]  [phone] 4  out of 5 stars 3 out of [place] number Footnote as displayed on [place]  1 This agency provides services under a federal waiver program to non-traditional, chronic long term population.  2 This agency provides services to a special needs population.  3 Not Available.  4 The number of patient episodes for this measure is too small to report.  5 This measure currently does not have data or provider has been certified/recertified for less than 6 months.  6 The national average for this measure is not provided because of state-to-state differences in data collection.  7 Medicare is not displaying rates for this measure for any home health agency, because of an issue with the data.  8 There were problems with the data and they are being corrected.  9 Zero, or very few, patients met the survey's rules for inclusion. The  scores shown, if any, reflect a very small number of surveys and may not accurately tell how an agency is doing.  10 Survey results are based on less than 12 months of data.  11 Fewer than 70 patients completed the survey. Use the scores shown, if any, with caution as the number of surveys may be too low to accurately tell how an agency is doing.  12 No survey results are available for this period.  13 Data suppressed by CMS for one or more quarters.

## 2019-04-17 NOTE — ED Notes (Signed)
Bed: QB16 Expected date:  Expected time:  Means of arrival:  Comments: EMS 48 yo female from home seizure-seizure tonight-A&O x 4-unable to start IV

## 2019-04-17 NOTE — ED Provider Notes (Signed)
Pt has remained stable.  No recurrent seizures.  We checked on tegretol level at 8 am.  Notified by lab that it was sent to [facility] for processing.  I checked back at noon because results still had not returned.  Notified that sample never was sent to [facility] and now it would have to be recollected.  At this point the patient has been here for 8 hours.  She does not need to stay later.  We will collect another sample and allow her to leave.  I will ask pt to follow up with her neurologist to check on the result.   Dorie Rank, MD 04/17/19 1204

## 2019-04-17 NOTE — ED Triage Notes (Signed)
Pt was lying in bed when seizure took place, no fall.

## 2019-04-18 ENCOUNTER — Telehealth: Payer: Self-pay | Admitting: Neurology

## 2019-04-18 MED ORDER — CARBAMAZEPINE 200 MG PO TABS: 300.0000 mg | ORAL_TABLET | Freq: Two times a day (BID) | ORAL | 4 refills | Status: DC

## 2019-04-18 NOTE — Telephone Encounter (Signed)
I called the patient.  The patient is to go up on the carbamazepine to 300 mg twice daily, she had another seizure was seen in the ER on 17 Apr 2019.

## 2019-04-19 ENCOUNTER — Telehealth: Payer: Self-pay | Admitting: Emergency Medicine

## 2019-04-19 ENCOUNTER — Other Ambulatory Visit: Payer: Self-pay

## 2019-04-19 ENCOUNTER — Telehealth: Payer: Self-pay

## 2019-04-19 DIAGNOSIS — I10 Essential (primary) hypertension: Secondary | ICD-10-CM | POA: Diagnosis not present

## 2019-04-19 DIAGNOSIS — Z8673 Personal history of transient ischemic attack (TIA), and cerebral infarction without residual deficits: Secondary | ICD-10-CM | POA: Diagnosis not present

## 2019-04-19 DIAGNOSIS — G4733 Obstructive sleep apnea (adult) (pediatric): Secondary | ICD-10-CM | POA: Diagnosis not present

## 2019-04-19 DIAGNOSIS — F419 Anxiety disorder, unspecified: Secondary | ICD-10-CM | POA: Diagnosis not present

## 2019-04-19 DIAGNOSIS — E119 Type 2 diabetes mellitus without complications: Secondary | ICD-10-CM

## 2019-04-19 DIAGNOSIS — G40211 Localization-related (focal) (partial) symptomatic epilepsy and epileptic syndromes with complex partial seizures, intractable, with status epilepticus: Secondary | ICD-10-CM | POA: Diagnosis not present

## 2019-04-19 DIAGNOSIS — Z7984 Long term (current) use of oral hypoglycemic drugs: Secondary | ICD-10-CM | POA: Diagnosis not present

## 2019-04-19 DIAGNOSIS — D649 Anemia, unspecified: Secondary | ICD-10-CM | POA: Diagnosis not present

## 2019-04-19 DIAGNOSIS — F319 Bipolar disorder, unspecified: Secondary | ICD-10-CM | POA: Diagnosis not present

## 2019-04-19 MED ORDER — ATORVASTATIN CALCIUM 10 MG PO TABS: 10.0000 mg | ORAL_TABLET | Freq: Every day | ORAL | 3 refills | Status: DC

## 2019-04-19 MED ORDER — TORSEMIDE 10 MG PO TABS: 10.0000 mg | ORAL_TABLET | Freq: Every day | ORAL | 0 refills | Status: DC

## 2019-04-19 MED ORDER — CARVEDILOL 3.125 MG PO TABS: 3.1250 mg | ORAL_TABLET | Freq: Every day | ORAL | 1 refills | Status: DC

## 2019-04-19 NOTE — Telephone Encounter (Signed)
FYIMaudie Mercury which is the home care provider for the patient is needing to know if the pt is to continue on the medication promizide that was given in the er. Message was sent to Dr. Mitchel Honour, waiting for response

## 2019-04-19 NOTE — Telephone Encounter (Signed)
The name of the drug is torsemide

## 2019-04-19 NOTE — Telephone Encounter (Signed)
Dr Mitchel Honour approved medication to be sent to the pharmacy, Spoke with Maudie Mercury at home care, she knows that pt is to continue med

## 2019-04-19 NOTE — Telephone Encounter (Signed)
Not sure what promizide is. Please find out more.

## 2019-04-19 NOTE — Telephone Encounter (Signed)
Given promizide at er, do you want her to continue on this med, home care is inquiring? Pt is also asking for refills on other medications which I will pend for you to sign. Not sure of any interactions with new medication given by er.

## 2019-04-19 NOTE — Telephone Encounter (Unsigned)
Copied from [place] [id number]. Topic: Quick Communication - Home Health Verbal Orders >> Apr 19, 2019  3:41 PM Sharene Skeans wrote: Caller/Agency: [facility] Number: [phone] Requesting Skilled Nursing  Frequency: 1x a week for 4 weeks with 1 PRN  Questions about Pt meds: Pt was prescribed promizide after an ER visit and Nurse wants to know if Dr. Mitchel Honour will be having her continue this medication due to Pt last dose will be taken on Thursday 5.14.20.  Pt also states that she is supoose to taking Lipitor , L[place] and Carvedilol. If so Pt doesn't have any bottles in the home and [facility] / please advise

## 2019-04-20 NOTE — Telephone Encounter (Signed)
Thanks

## 2019-04-21 DIAGNOSIS — G40211 Localization-related (focal) (partial) symptomatic epilepsy and epileptic syndromes with complex partial seizures, intractable, with status epilepticus: Secondary | ICD-10-CM | POA: Diagnosis not present

## 2019-04-21 DIAGNOSIS — D649 Anemia, unspecified: Secondary | ICD-10-CM | POA: Diagnosis not present

## 2019-04-21 DIAGNOSIS — E119 Type 2 diabetes mellitus without complications: Secondary | ICD-10-CM | POA: Diagnosis not present

## 2019-04-21 DIAGNOSIS — Z8673 Personal history of transient ischemic attack (TIA), and cerebral infarction without residual deficits: Secondary | ICD-10-CM | POA: Diagnosis not present

## 2019-04-21 DIAGNOSIS — F419 Anxiety disorder, unspecified: Secondary | ICD-10-CM | POA: Diagnosis not present

## 2019-04-21 DIAGNOSIS — Z7984 Long term (current) use of oral hypoglycemic drugs: Secondary | ICD-10-CM | POA: Diagnosis not present

## 2019-04-21 DIAGNOSIS — G4733 Obstructive sleep apnea (adult) (pediatric): Secondary | ICD-10-CM | POA: Diagnosis not present

## 2019-04-21 DIAGNOSIS — F319 Bipolar disorder, unspecified: Secondary | ICD-10-CM | POA: Diagnosis not present

## 2019-04-21 DIAGNOSIS — I10 Essential (primary) hypertension: Secondary | ICD-10-CM | POA: Diagnosis not present

## 2019-04-22 DIAGNOSIS — G40211 Localization-related (focal) (partial) symptomatic epilepsy and epileptic syndromes with complex partial seizures, intractable, with status epilepticus: Secondary | ICD-10-CM | POA: Diagnosis not present

## 2019-04-22 DIAGNOSIS — D649 Anemia, unspecified: Secondary | ICD-10-CM | POA: Diagnosis not present

## 2019-04-22 DIAGNOSIS — G4733 Obstructive sleep apnea (adult) (pediatric): Secondary | ICD-10-CM | POA: Diagnosis not present

## 2019-04-22 DIAGNOSIS — Z7984 Long term (current) use of oral hypoglycemic drugs: Secondary | ICD-10-CM | POA: Diagnosis not present

## 2019-04-22 DIAGNOSIS — Z8673 Personal history of transient ischemic attack (TIA), and cerebral infarction without residual deficits: Secondary | ICD-10-CM | POA: Diagnosis not present

## 2019-04-22 DIAGNOSIS — E119 Type 2 diabetes mellitus without complications: Secondary | ICD-10-CM | POA: Diagnosis not present

## 2019-04-22 DIAGNOSIS — F419 Anxiety disorder, unspecified: Secondary | ICD-10-CM | POA: Diagnosis not present

## 2019-04-22 DIAGNOSIS — F319 Bipolar disorder, unspecified: Secondary | ICD-10-CM | POA: Diagnosis not present

## 2019-04-22 DIAGNOSIS — I10 Essential (primary) hypertension: Secondary | ICD-10-CM | POA: Diagnosis not present

## 2019-04-23 ENCOUNTER — Telehealth: Payer: Self-pay | Admitting: Emergency Medicine

## 2019-04-23 NOTE — Telephone Encounter (Signed)
Copied from [place] [id number]. Topic: Quick Communication - Home Health Verbal Orders >> Apr 23, 2019  9:29 AM Sheran Luz wrote: Caller/Agency: [facility] [place] Number: [phone] Requesting Speech Therapy for cognitive communication deficit  Frequency: 2w3

## 2019-04-27 NOTE — Telephone Encounter (Signed)
Brandi Gomez from [place] is calling to check on the staus of verbal orders. Please advise. CB- [phone]

## 2019-04-28 DIAGNOSIS — D649 Anemia, unspecified: Secondary | ICD-10-CM | POA: Diagnosis not present

## 2019-04-28 DIAGNOSIS — I10 Essential (primary) hypertension: Secondary | ICD-10-CM | POA: Diagnosis not present

## 2019-04-28 DIAGNOSIS — F419 Anxiety disorder, unspecified: Secondary | ICD-10-CM | POA: Diagnosis not present

## 2019-04-28 DIAGNOSIS — G40211 Localization-related (focal) (partial) symptomatic epilepsy and epileptic syndromes with complex partial seizures, intractable, with status epilepticus: Secondary | ICD-10-CM | POA: Diagnosis not present

## 2019-04-28 DIAGNOSIS — G4733 Obstructive sleep apnea (adult) (pediatric): Secondary | ICD-10-CM | POA: Diagnosis not present

## 2019-04-28 DIAGNOSIS — E119 Type 2 diabetes mellitus without complications: Secondary | ICD-10-CM | POA: Diagnosis not present

## 2019-04-28 DIAGNOSIS — Z7984 Long term (current) use of oral hypoglycemic drugs: Secondary | ICD-10-CM | POA: Diagnosis not present

## 2019-04-28 DIAGNOSIS — F319 Bipolar disorder, unspecified: Secondary | ICD-10-CM | POA: Diagnosis not present

## 2019-04-28 DIAGNOSIS — Z8673 Personal history of transient ischemic attack (TIA), and cerebral infarction without residual deficits: Secondary | ICD-10-CM | POA: Diagnosis not present

## 2019-04-28 NOTE — Telephone Encounter (Signed)
Provided verbal auth to [place] at [facility] for speech therapy covering cognitive communication.

## 2019-04-30 DIAGNOSIS — G4733 Obstructive sleep apnea (adult) (pediatric): Secondary | ICD-10-CM | POA: Diagnosis not present

## 2019-05-03 ENCOUNTER — Encounter (HOSPITAL_COMMUNITY): Payer: Self-pay

## 2019-05-03 ENCOUNTER — Ambulatory Visit (HOSPITAL_COMMUNITY)
Admission: EM | Admit: 2019-05-03 | Discharge: 2019-05-03 | Disposition: A | Discharge status: Home / Self Care | Payer: Medicare HMO | Attending: Family Medicine | Admitting: Family Medicine

## 2019-05-03 ENCOUNTER — Other Ambulatory Visit: Payer: Self-pay

## 2019-05-03 DIAGNOSIS — R300 Dysuria: Secondary | ICD-10-CM

## 2019-05-03 DIAGNOSIS — Z7984 Long term (current) use of oral hypoglycemic drugs: Secondary | ICD-10-CM | POA: Diagnosis not present

## 2019-05-03 DIAGNOSIS — R109 Unspecified abdominal pain: Secondary | ICD-10-CM | POA: Insufficient documentation

## 2019-05-03 DIAGNOSIS — G4733 Obstructive sleep apnea (adult) (pediatric): Secondary | ICD-10-CM | POA: Diagnosis not present

## 2019-05-03 DIAGNOSIS — I10 Essential (primary) hypertension: Secondary | ICD-10-CM | POA: Diagnosis not present

## 2019-05-03 DIAGNOSIS — D649 Anemia, unspecified: Secondary | ICD-10-CM | POA: Diagnosis not present

## 2019-05-03 DIAGNOSIS — F319 Bipolar disorder, unspecified: Secondary | ICD-10-CM | POA: Diagnosis not present

## 2019-05-03 DIAGNOSIS — Z8673 Personal history of transient ischemic attack (TIA), and cerebral infarction without residual deficits: Secondary | ICD-10-CM | POA: Diagnosis not present

## 2019-05-03 DIAGNOSIS — F419 Anxiety disorder, unspecified: Secondary | ICD-10-CM | POA: Diagnosis not present

## 2019-05-03 DIAGNOSIS — E119 Type 2 diabetes mellitus without complications: Secondary | ICD-10-CM | POA: Diagnosis not present

## 2019-05-03 DIAGNOSIS — G40211 Localization-related (focal) (partial) symptomatic epilepsy and epileptic syndromes with complex partial seizures, intractable, with status epilepticus: Secondary | ICD-10-CM | POA: Diagnosis not present

## 2019-05-03 LAB — POCT URINALYSIS DIP (DEVICE)
Bilirubin Urine: NEGATIVE
Glucose, UA: NEGATIVE mg/dL
Hgb urine dipstick: NEGATIVE
Ketones, ur: NEGATIVE mg/dL
Leukocytes,Ua: NEGATIVE
Nitrite: NEGATIVE
Protein, ur: NEGATIVE mg/dL
Specific Gravity, Urine: 1.025 (ref 1.005–1.030)
Urobilinogen, UA: 0.2 mg/dL (ref 0.0–1.0)
pH: 5.5 (ref 5.0–8.0)

## 2019-05-03 LAB — POCT PREGNANCY, URINE: Preg Test, Ur: NEGATIVE

## 2019-05-03 MED ORDER — CLOTRIMAZOLE 1 % EX CREA: TOPICAL_CREAM | CUTANEOUS | 0 refills | Status: DC

## 2019-05-03 MED ORDER — NAPROXEN 375 MG PO TABS: 375.0000 mg | ORAL_TABLET | Freq: Two times a day (BID) | ORAL | 0 refills | Status: DC

## 2019-05-03 NOTE — ED Triage Notes (Signed)
Pt presents with symptoms of urinary tract infection; abdominal discomfort, flank pain, and abnormal urine.  Pt also states her period is 16 days late.

## 2019-05-03 NOTE — ED Provider Notes (Signed)
[place]    CSN: [id number] Arrival date & time: 05/03/19  1248     History   Chief Complaint Chief Complaint  Patient presents with  . Urinary Tract Infection    HPI Brandi Gomez is a 48 y.o. female history of hypertension, seizures, DM type II, OSA, recent stroke, presenting today for evaluation of possible UTI and pregnancy test.  Patient states that over the past week she has had intermittent abdominal cramping mainly on her right side.  She has also noticed that her urine has been more yellow than normal.  She has had some slight dysuria and slight increase in frequency.  She also notes that she is 16 days late on her menstrual cycle.  Typically has regular cycles.  Does not believe she is perimenopausal yet.  She denies abnormal vaginal discharge itching or irritation.  She has noticed a slight rash in her groin region which she has attributed to chafing from pads.  She denies itching associated with this.  States that is been there for a while.  Eating and drinking like normal.  Denies nausea vomiting or diarrhea.  HPI  Past Medical History:  Diagnosis Date  . Anemia   . Anxiety   . Bipolar 1 disorder ([place])   . Common migraine 05/19/2015  . Depression   . Diabetes mellitus, type II ([place])   . Hypertension   . Irritable bowel syndrome (IBS)   . Mild mental retardation   . Obesity   . Partial complex seizure disorder with intractable epilepsy ([place]) 05/12/2014  . Seizures ([place])    intractable, sz 08/23/17  . Sleep apnea   . Stroke ([place])   . Type II or unspecified type diabetes mellitus without mention of complication, not stated as uncontrolled     Patient Active Problem List   Diagnosis Date Noted  . History of recent stroke 08/06/2017  . OSA (obstructive sleep apnea)   . Cryptogenic stroke ([place]) 05/20/2017  . Controlled type 2 diabetes mellitus without complication, without long-term current use of insulin ([place]) 05/08/2017  . Essential hypertension  07/27/2008  . Seizure disorder ([place]) 02/05/2007    Past Surgical History:  Procedure Laterality Date  . COLONOSCOPY     2012-normal , Dr Sharlett Iles  . ESOPHAGOGASTRODUODENOSCOPY     normal-Dr Patterson 2012  . LOOP RECORDER INSERTION N/A 05/30/2017   Procedure: Loop Recorder Insertion;  Surgeon: Constance Haw, MD;  Location: [facility];  Service: Cardiovascular;  Laterality: N/A;  . LOOP RECORDER REMOVAL N/A 03/04/2018   Procedure: LOOP RECORDER REMOVAL;  Surgeon: Constance Haw, MD;  Location: [facility];  Service: Cardiovascular;  Laterality: N/A;  . MYRINGOTOMY WITH TUBE PLACEMENT    . MYRINGOTOMY WITH TUBE PLACEMENT Right 11/05/2017   Procedure: MYRINGOTOMY WITH TUBE PLACEMENT;  Surgeon: Melissa Montane, MD;  Location: [facility];  Service: ENT;  Laterality: Right;  right T Tube placement  . NASAL SINUS SURGERY    . TEE WITHOUT CARDIOVERSION N/A 05/30/2017   Procedure: TRANSESOPHAGEAL ECHOCARDIOGRAM (TEE);  Surgeon: Acie Fredrickson Wonda Cheng, MD;  Location: [facility];  Service: Cardiovascular;  Laterality: N/A;    OB History   No obstetric history on file.      Home Medications    Prior to Admission medications   Medication Sig Start Date End Date Taking? Authorizing Provider  atorvastatin (LIPITOR) 10 MG tablet Take 1 tablet (10 mg total) by mouth daily at 6 PM. 04/19/19   [place], [place],  MD  carbamazepine (TEGRETOL) 200 MG tablet Take 1.5 tablets (300 mg total) by mouth 2 (two) times daily at 10 AM and 5 PM. 04/18/19   [name], MD  carvedilol (COREG) 3.125 MG tablet Take 1 tablet (3.125 mg total) by mouth daily. 04/19/19   Horald Pollen, MD  clotrimazole (LOTRIMIN) 1 % cream Apply to affected area 2 times daily 05/03/19   Wieters, Hallie C, PA-C  DULoxetine (CYMBALTA) 30 MG capsule Take 1 capsule (30 mg total) by mouth daily. 03/17/19   Plovsky, Berneta Sages, MD  lacosamide (VIMPAT) 200 MG TABS tablet Take 1 tablet (200 mg total) by mouth 2 (two)  times daily. 04/13/19   [name], MD  naproxen (NAPROSYN) 375 MG tablet Take 1 tablet (375 mg total) by mouth 2 (two) times daily. 05/03/19   Wieters, Hallie C, PA-C  sitaGLIPtin (JANUVIA) 50 MG tablet Take 1 tablet (50 mg total) by mouth daily. Schedule appointment for additional refills. 02/15/19   Horald Pollen, MD  torsemide (DEMADEX) 10 MG tablet Take 1 tablet (10 mg total) by mouth daily. 04/19/19   Horald Pollen, MD    Family History Family History  Problem Relation Age of Onset  . Diabetes Mother        passed away from accidental death  . Hypertension Mother   . Bipolar disorder Father   . Diabetes Daughter   . Leukemia Daughter   . Ovarian cancer Maternal Aunt     Social History Social History   Tobacco Use  . Smoking status: Never Smoker  . Smokeless tobacco: Never Used  Substance Use Topics  . Alcohol use: No  . Drug use: No     Allergies   Amoxicillin; Lisinopril; and Hydrocodone   Review of Systems Review of Systems  Constitutional: Negative for activity change, appetite change, chills, fatigue and fever.  HENT: Negative for congestion, ear pain, rhinorrhea, sinus pressure, sore throat and trouble swallowing.   Eyes: Negative for discharge and redness.  Respiratory: Negative for cough, chest tightness and shortness of breath.   Cardiovascular: Negative for chest pain.  Gastrointestinal: Positive for abdominal pain. Negative for diarrhea, nausea and vomiting.  Genitourinary: Positive for dysuria and frequency. Negative for decreased urine volume, genital sores, pelvic pain, urgency, vaginal bleeding and vaginal discharge.  Musculoskeletal: Negative for myalgias.  Skin: Negative for rash.  Neurological: Negative for dizziness, light-headedness and headaches.     Physical Exam Triage Vital Signs ED Triage Vitals  Enc Vitals Group     BP 05/03/19 1310 (!) 153/87     Pulse Rate 05/03/19 1310 81     Resp 05/03/19 1310 18     Temp  05/03/19 1310 98.8 F (37.1 C)     Temp Source 05/03/19 1310 Oral     SpO2 05/03/19 1310 100 %     Weight --      Height --      Head Circumference --      Peak Flow --      Pain Score 05/03/19 1315 6     Pain Loc --      Pain Edu? --      Excl. in [place]? --    No data found.  Updated Vital Signs BP (!) 153/87 (BP Location: Right Arm)   Pulse 81   Temp 98.8 F (37.1 C) (Oral)   Resp 18   LMP 03/22/2019   SpO2 100%   Visual Acuity Right Eye Distance:   Left  Eye Distance:   Bilateral Distance:    Right Eye Near:   Left Eye Near:    Bilateral Near:     Physical Exam Vitals signs and nursing note reviewed.  Constitutional:      General: She is not in acute distress.    Appearance: She is well-developed.  HENT:     Head: Normocephalic and atraumatic.  Eyes:     Conjunctiva/sclera: Conjunctivae normal.  Neck:     Musculoskeletal: Neck supple.  Cardiovascular:     Rate and Rhythm: Normal rate and regular rhythm.     Heart sounds: No murmur.  Pulmonary:     Effort: Pulmonary effort is normal. No respiratory distress.     Breath sounds: Normal breath sounds.  Abdominal:     Palpations: Abdomen is soft.     Tenderness: There is no abdominal tenderness.     Comments: Soft, nondistended, nontender to light and deep palpation throughout entire abdomen  Genitourinary:    Comments: Slight erythema noted to lower labia majora bilaterally, no noted skin breakdown or lesions.  Vaginal mucosa does appear dry, no visible discharge. Skin:    General: Skin is warm and dry.  Neurological:     Mental Status: She is alert.      UC Treatments / Results  Labs (all labs ordered are listed, but only abnormal results are displayed) Labs Reviewed  POC URINE PREG, ED  POCT URINALYSIS DIP (DEVICE)  POCT PREGNANCY, URINE  CERVICOVAGINAL ANCILLARY ONLY    EKG None  Radiology No results found.  Procedures Procedures (including critical care time)  Medications Ordered in  UC Medications - No data to display  Initial Impression / Assessment and Plan / UC Course  I have reviewed the triage vital signs and the nursing notes.  Pertinent labs & imaging results that were available during my care of the patient were reviewed by me and considered in my medical decision making (see chart for details).  Clinical Course as of May 02 1342  Mon May 03, 2019  1338 POCT Urinalysis, Dipstick [HW]    Clinical Course User Index [HW] Wieters, Hallie C, PA-C    UA completely unremarkable, pregnancy negative, patient most likely perimenopausal and likely starting to have irregular cycles.  Will obtain vaginal swab to check for BV, yeast and STDs as other causes of urinary symptoms and abdominal discomfort.  Patient currently without abdominal pain, denies suspect underlying abdominal emergency.  Vital signs stable.  Will treat with Naprosyn for cramping and provide Lotrimin twice daily for rash consistently over the next couple weeks.  Continue to monitor,Discussed strict return precautions. Patient verbalized understanding and is agreeable with plan.  Final Clinical Impressions(s) / UC Diagnoses   Final diagnoses:  Dysuria  Abdominal cramping     Discharge Instructions     Your urine did not show signs of infection, it was completely normal Pregnancy test was negative Please use Naprosyn twice daily with food on stomach for further management of cramping May apply Lotrimin twice daily to rash consistently over the next couple weeks We will call with the results of the swab in a few days  Please continue to monitor symptoms and follow-up if symptoms worsening or not improving.   ED Prescriptions    Medication Sig Dispense Auth. Provider   clotrimazole (LOTRIMIN) 1 % cream Apply to affected area 2 times daily 30 g Wieters, Hallie C, PA-C   naproxen (NAPROSYN) 375 MG tablet Take 1 tablet (375 mg total) by  mouth 2 (two) times daily. 20 tablet Wieters, Lynn C, PA-C      Controlled Substance Prescriptions [place] Controlled Substance Registry consulted? Not Applicable   Janith Lima, [place] 05/03/19 1350

## 2019-05-03 NOTE — Discharge Instructions (Signed)
Your urine did not show signs of infection, it was completely normal Pregnancy test was negative Please use Naprosyn twice daily with food on stomach for further management of cramping May apply Lotrimin twice daily to rash consistently over the next couple weeks We will call with the results of the swab in a few days  Please continue to monitor symptoms and follow-up if symptoms worsening or not improving.

## 2019-05-04 ENCOUNTER — Emergency Department (HOSPITAL_COMMUNITY)
Admission: EM | Admit: 2019-05-04 | Discharge: 2019-05-05 | Disposition: A | Discharge status: Home / Self Care | Payer: Medicare HMO | Attending: Emergency Medicine | Admitting: Emergency Medicine

## 2019-05-04 ENCOUNTER — Encounter (HOSPITAL_COMMUNITY): Payer: Self-pay

## 2019-05-04 ENCOUNTER — Telehealth (HOSPITAL_COMMUNITY): Payer: Self-pay | Admitting: Emergency Medicine

## 2019-05-04 DIAGNOSIS — Z7984 Long term (current) use of oral hypoglycemic drugs: Secondary | ICD-10-CM | POA: Diagnosis not present

## 2019-05-04 DIAGNOSIS — I10 Essential (primary) hypertension: Secondary | ICD-10-CM | POA: Insufficient documentation

## 2019-05-04 DIAGNOSIS — E119 Type 2 diabetes mellitus without complications: Secondary | ICD-10-CM | POA: Diagnosis not present

## 2019-05-04 DIAGNOSIS — I959 Hypotension, unspecified: Secondary | ICD-10-CM | POA: Diagnosis not present

## 2019-05-04 DIAGNOSIS — R51 Headache: Secondary | ICD-10-CM | POA: Insufficient documentation

## 2019-05-04 DIAGNOSIS — Z79899 Other long term (current) drug therapy: Secondary | ICD-10-CM | POA: Diagnosis not present

## 2019-05-04 DIAGNOSIS — R519 Headache, unspecified: Secondary | ICD-10-CM

## 2019-05-04 DIAGNOSIS — Z8673 Personal history of transient ischemic attack (TIA), and cerebral infarction without residual deficits: Secondary | ICD-10-CM | POA: Diagnosis not present

## 2019-05-04 DIAGNOSIS — R0902 Hypoxemia: Secondary | ICD-10-CM | POA: Diagnosis not present

## 2019-05-04 DIAGNOSIS — R001 Bradycardia, unspecified: Secondary | ICD-10-CM | POA: Diagnosis not present

## 2019-05-04 LAB — CERVICOVAGINAL ANCILLARY ONLY
Bacterial vaginitis: POSITIVE — AB
Candida vaginitis: NEGATIVE
Chlamydia: NEGATIVE
Neisseria Gonorrhea: NEGATIVE
Trichomonas: NEGATIVE

## 2019-05-04 MED ORDER — ACETAMINOPHEN 500 MG PO TABS
1000.0000 mg | ORAL_TABLET | Freq: Once | ORAL | Status: AC
  Administered 2019-05-05: 1000 mg via ORAL
  Filled 2019-05-04: qty 2

## 2019-05-04 MED ORDER — LACOSAMIDE 50 MG PO TABS
200.0000 mg | ORAL_TABLET | Freq: Once | ORAL | Status: AC
  Administered 2019-05-05: 200 mg via ORAL
  Filled 2019-05-04: qty 4

## 2019-05-04 MED ORDER — METRONIDAZOLE 500 MG PO TABS: 500.0000 mg | ORAL_TABLET | Freq: Two times a day (BID) | ORAL | 0 refills | Status: DC

## 2019-05-04 NOTE — ED Triage Notes (Signed)
Pt states that her 2200 seizure medication is due

## 2019-05-04 NOTE — ED Provider Notes (Signed)
[facility] DEPT Provider Note   CSN: [id number] Arrival date & time: 05/04/19  2157    History   Chief Complaint Chief Complaint  Patient presents with  . Headache    HPI Brandi Gomez is a 48 y.o. female.     The history is provided by the patient and medical records.  Headache     48 year old female with history of anemia, anxiety, bipolar disorder, migraines, depression, diabetes, hypertension, prior stroke, sleep apnea, seizures, mild mental retardation, presenting to the ED with headache.  States this started about an hour prior to arrival.  States she feels it throughout her whole head, described as a dull ache.  She denies any associated numbness, weakness, dizziness, confusion, blurred vision, tinnitus, changes in speech, difficulty walking, fever, chills, or neck pain.  States she just feels "bad".  She denies any falls or head trauma.  She is not currently on anticoagulation.  States her headache seems to be feeling better by time of my evaluation but she was "afraid to go to sleep" because she might have a seizure.  States she missed her 10:00 PM dose of Vimpat because she was in the ambulance.  Past Medical History:  Diagnosis Date  . Anemia   . Anxiety   . Bipolar 1 disorder ([place])   . Common migraine 05/19/2015  . Depression   . Diabetes mellitus, type II ([place])   . Hypertension   . Irritable bowel syndrome (IBS)   . Mild mental retardation   . Obesity   . Partial complex seizure disorder with intractable epilepsy ([place]) 05/12/2014  . Seizures ([place])    intractable, sz 08/23/17  . Sleep apnea   . Stroke ([place])   . Type II or unspecified type diabetes mellitus without mention of complication, not stated as uncontrolled     Patient Active Problem List   Diagnosis Date Noted  . History of recent stroke 08/06/2017  . OSA (obstructive sleep apnea)   . Cryptogenic stroke ([place]) 05/20/2017  . Controlled type 2 diabetes mellitus  without complication, without long-term current use of insulin ([place]) 05/08/2017  . Essential hypertension 07/27/2008  . Seizure disorder ([place]) 02/05/2007    Past Surgical History:  Procedure Laterality Date  . COLONOSCOPY     2012-normal , Dr Sharlett Iles  . ESOPHAGOGASTRODUODENOSCOPY     normal-Dr Patterson 2012  . LOOP RECORDER INSERTION N/A 05/30/2017   Procedure: Loop Recorder Insertion;  Surgeon: Constance Haw, MD;  Location: [facility];  Service: Cardiovascular;  Laterality: N/A;  . LOOP RECORDER REMOVAL N/A 03/04/2018   Procedure: LOOP RECORDER REMOVAL;  Surgeon: Constance Haw, MD;  Location: [facility];  Service: Cardiovascular;  Laterality: N/A;  . MYRINGOTOMY WITH TUBE PLACEMENT    . MYRINGOTOMY WITH TUBE PLACEMENT Right 11/05/2017   Procedure: MYRINGOTOMY WITH TUBE PLACEMENT;  Surgeon: Melissa Montane, MD;  Location: [facility];  Service: ENT;  Laterality: Right;  right T Tube placement  . NASAL SINUS SURGERY    . TEE WITHOUT CARDIOVERSION N/A 05/30/2017   Procedure: TRANSESOPHAGEAL ECHOCARDIOGRAM (TEE);  Surgeon: Acie Fredrickson Wonda Cheng, MD;  Location: [facility];  Service: Cardiovascular;  Laterality: N/A;     OB History   No obstetric history on file.      Home Medications    Prior to Admission medications   Medication Sig Start Date End Date Taking? Authorizing Provider  atorvastatin (LIPITOR) 10 MG tablet Take 1 tablet (10 mg total) by mouth daily  at 6 PM. 04/19/19   Sag[place]ia, Ines Bloomer, MD  carbamazepine (TEGRETOL) 200 MG tablet Take 1.5 tablets (300 mg total) by mouth 2 (two) times daily at 10 AM and 5 PM. 04/18/19   [name], MD  carvedilol (COREG) 3.125 MG tablet Take 1 tablet (3.125 mg total) by mouth daily. 04/19/19   Horald Pollen, MD  clotrimazole (LOTRIMIN) 1 % cream Apply to affected area 2 times daily 05/03/19   Wieters, Hallie C, PA-C  DULoxetine (CYMBALTA) 30 MG capsule Take 1 capsule (30 mg total) by mouth daily. 03/17/19    Plovsky, Berneta Sages, MD  lacosamide (VIMPAT) 200 MG TABS tablet Take 1 tablet (200 mg total) by mouth 2 (two) times daily. 04/13/19   [name], MD  metroNIDAZOLE (FLAGYL) 500 MG tablet Take 1 tablet (500 mg total) by mouth 2 (two) times daily for 7 days. 05/04/19 05/11/19  Raylene Everts, MD  naproxen (NAPROSYN) 375 MG tablet Take 1 tablet (375 mg total) by mouth 2 (two) times daily. 05/03/19   Wieters, Hallie C, PA-C  sitaGLIPtin (JANUVIA) 50 MG tablet Take 1 tablet (50 mg total) by mouth daily. Schedule appointment for additional refills. 02/15/19   Horald Pollen, MD  torsemide (DEMADEX) 10 MG tablet Take 1 tablet (10 mg total) by mouth daily. 04/19/19   Horald Pollen, MD    Family History Family History  Problem Relation Age of Onset  . Diabetes Mother        passed away from accidental death  . Hypertension Mother   . Bipolar disorder Father   . Diabetes Daughter   . Leukemia Daughter   . Ovarian cancer Maternal Aunt     Social History Social History   Tobacco Use  . Smoking status: Never Smoker  . Smokeless tobacco: Never Used  Substance Use Topics  . Alcohol use: No  . Drug use: No     Allergies   Amoxicillin; Lisinopril; and Hydrocodone   Review of Systems Review of Systems  Neurological: Positive for headaches.  All other systems reviewed and are negative.    Physical Exam Updated Vital Signs BP (!) 141/93 (BP Location: Right Arm)   Pulse 62   Temp 98.8 F (37.1 C) (Oral)   Resp 18   Ht 5\' 1"  (1.549 m)   Wt 117.9 kg   SpO2 98%   BMI 49.13 kg/m   Physical Exam Vitals signs and nursing note reviewed.  Constitutional:      General: She is not in acute distress.    Appearance: She is well-developed. She is not diaphoretic.     Comments: Happy, smiling during exam, pleasant  HENT:     Head: Normocephalic and atraumatic.     Right Ear: External ear normal.     Left Ear: External ear normal.  Eyes:     Conjunctiva/sclera:  Conjunctivae normal.     Pupils: Pupils are equal, round, and reactive to light.  Neck:     Musculoskeletal: Full passive range of motion without pain, normal range of motion and neck supple. No neck rigidity.     Comments: No rigidity, no meningismus Cardiovascular:     Rate and Rhythm: Normal rate and regular rhythm.     Heart sounds: Normal heart sounds. No murmur.  Pulmonary:     Effort: Pulmonary effort is normal. No respiratory distress.     Breath sounds: Normal breath sounds. No wheezing or rhonchi.  Abdominal:     General: Bowel sounds  are normal.     Palpations: Abdomen is soft.     Tenderness: There is no abdominal tenderness. There is no guarding.  Musculoskeletal: Normal range of motion.  Skin:    General: Skin is warm and dry.     Findings: No rash.  Neurological:     Mental Status: She is alert and oriented to person, place, and time.     Cranial Nerves: No cranial nerve deficit.     Sensory: No sensory deficit.     Motor: No tremor or seizure activity.     Comments: AAOx3, answering questions and following commands appropriately; equal strength UE and LE bilaterally; CN grossly intact; moves all extremities appropriately without ataxia; no focal neuro deficits or facial asymmetry appreciated  Psychiatric:        Behavior: Behavior normal.        Thought Content: Thought content normal.      ED Treatments / Results  Labs (all labs ordered are listed, but only abnormal results are displayed) Labs Reviewed - No data to display  EKG None  Radiology No results found.  Procedures Procedures (including critical care time)  Medications Ordered in ED Medications  acetaminophen (TYLENOL) tablet 1,000 mg (1,000 mg Oral Given 05/05/19 0022)  lacosamide (VIMPAT) tablet 200 mg (200 mg Oral Given 05/05/19 0022)     Initial Impression / Assessment and Plan / ED Course  I have reviewed the triage vital signs and the nursing notes.  Pertinent labs & imaging results  that were available during my care of the patient were reviewed by me and considered in my medical decision making (see chart for details).  48 year old female here with headache.  States it began about an hour prior to arrival.  Headache is dull and aching throughout her entire head.  She has no associated symptoms with this.  She does have history of migraines.  By time of my evaluation she states headache seems to be going away but is still mild.  She is awake, alert, appropriately oriented.  She does not have any focal neurologic deficits.  She does not have any fever or nuchal rigidity suggestive of meningitis.  Her vitals are stable.  I have very low suspicion for any acute intracranial pathology.  I do not feel she needs labs or imaging at this time.  She requested something for her headache so given dose of Tylenol as well as her nighttime dose of Vimpat that she missed since she was in the ambulance.  I have encouraged her to continue symptomatic care at home as needed.  She can follow-up with her primary care doctor.  Return here for any new or acute changes.  Final Clinical Impressions(s) / ED Diagnoses   Final diagnoses:  Mild headache    ED Discharge Orders    None       Larene Pickett, PA-C 05/05/19 0031    Orpah Greek, MD 05/05/19 [phone]

## 2019-05-04 NOTE — ED Triage Notes (Signed)
Pt complains of a headache for about an hour, she was diagnosed yesterday with a UTI but didn't receive any medications for it. Pt states she just feels bad with a headache

## 2019-05-04 NOTE — Telephone Encounter (Signed)
Bacterial vaginosis is positive. This was not treated at the urgent care visit.  Flagyl 500 mg BID x 7 days #14 no refills sent to patients pharmacy of choice.    Patient contacted and made aware of all results, all questions answered.

## 2019-05-05 ENCOUNTER — Telehealth: Payer: Self-pay | Admitting: Emergency Medicine

## 2019-05-05 ENCOUNTER — Other Ambulatory Visit: Payer: Self-pay | Admitting: Emergency Medicine

## 2019-05-05 ENCOUNTER — Other Ambulatory Visit: Payer: Self-pay

## 2019-05-05 ENCOUNTER — Ambulatory Visit (HOSPITAL_COMMUNITY): Payer: Medicare HMO | Admitting: Psychiatry

## 2019-05-05 DIAGNOSIS — Z7984 Long term (current) use of oral hypoglycemic drugs: Secondary | ICD-10-CM | POA: Diagnosis not present

## 2019-05-05 DIAGNOSIS — I10 Essential (primary) hypertension: Secondary | ICD-10-CM

## 2019-05-05 DIAGNOSIS — F319 Bipolar disorder, unspecified: Secondary | ICD-10-CM | POA: Diagnosis not present

## 2019-05-05 DIAGNOSIS — E119 Type 2 diabetes mellitus without complications: Secondary | ICD-10-CM | POA: Diagnosis not present

## 2019-05-05 DIAGNOSIS — G4733 Obstructive sleep apnea (adult) (pediatric): Secondary | ICD-10-CM | POA: Diagnosis not present

## 2019-05-05 DIAGNOSIS — D649 Anemia, unspecified: Secondary | ICD-10-CM | POA: Diagnosis not present

## 2019-05-05 DIAGNOSIS — R51 Headache: Secondary | ICD-10-CM | POA: Diagnosis not present

## 2019-05-05 DIAGNOSIS — F419 Anxiety disorder, unspecified: Secondary | ICD-10-CM | POA: Diagnosis not present

## 2019-05-05 DIAGNOSIS — G40211 Localization-related (focal) (partial) symptomatic epilepsy and epileptic syndromes with complex partial seizures, intractable, with status epilepticus: Secondary | ICD-10-CM | POA: Diagnosis not present

## 2019-05-05 DIAGNOSIS — Z8673 Personal history of transient ischemic attack (TIA), and cerebral infarction without residual deficits: Secondary | ICD-10-CM | POA: Diagnosis not present

## 2019-05-05 NOTE — Discharge Instructions (Signed)
Can continue taking tylenol or motrin for headache. I recommend that you follow-up with your primary care doctor. Return here for any new/acute changes.

## 2019-05-05 NOTE — Telephone Encounter (Signed)
Copied from [place] [phone]. Topic: Quick Communication - See Telephone Encounter >> May 05, 2019  1:07 PM Sheran Luz wrote: CRM for notification. See Telephone encounter for: 05/05/19.  Minda, with [facility], requesting call back from CMA to go over patients current medication list.

## 2019-05-05 NOTE — Telephone Encounter (Signed)
Refill request for torsemide 10 mg. 7 tablets given on 04/19/19 at last refill  LOV  11/11/18 Dr. Mitchel Honour  No upcoming appointment

## 2019-05-06 ENCOUNTER — Telehealth (HOSPITAL_COMMUNITY): Payer: Self-pay

## 2019-05-06 ENCOUNTER — Telehealth (INDEPENDENT_AMBULATORY_CARE_PROVIDER_SITE_OTHER): Payer: Medicare HMO | Admitting: Emergency Medicine

## 2019-05-06 ENCOUNTER — Encounter: Payer: Self-pay | Admitting: Emergency Medicine

## 2019-05-06 DIAGNOSIS — Z8673 Personal history of transient ischemic attack (TIA), and cerebral infarction without residual deficits: Secondary | ICD-10-CM | POA: Diagnosis not present

## 2019-05-06 DIAGNOSIS — D649 Anemia, unspecified: Secondary | ICD-10-CM | POA: Diagnosis not present

## 2019-05-06 DIAGNOSIS — E119 Type 2 diabetes mellitus without complications: Secondary | ICD-10-CM

## 2019-05-06 DIAGNOSIS — Z7984 Long term (current) use of oral hypoglycemic drugs: Secondary | ICD-10-CM | POA: Diagnosis not present

## 2019-05-06 DIAGNOSIS — I10 Essential (primary) hypertension: Secondary | ICD-10-CM | POA: Diagnosis not present

## 2019-05-06 DIAGNOSIS — G40211 Localization-related (focal) (partial) symptomatic epilepsy and epileptic syndromes with complex partial seizures, intractable, with status epilepticus: Secondary | ICD-10-CM | POA: Diagnosis not present

## 2019-05-06 DIAGNOSIS — F319 Bipolar disorder, unspecified: Secondary | ICD-10-CM | POA: Diagnosis not present

## 2019-05-06 DIAGNOSIS — F419 Anxiety disorder, unspecified: Secondary | ICD-10-CM | POA: Diagnosis not present

## 2019-05-06 DIAGNOSIS — G4733 Obstructive sleep apnea (adult) (pediatric): Secondary | ICD-10-CM | POA: Diagnosis not present

## 2019-05-06 MED ORDER — BLOOD PRESSURE MONITOR KIT: PACK | 0 refills | Status: DC

## 2019-05-06 MED ORDER — FREESTYLE SYSTEM KIT: 1.0000 | PACK | 1 refills | Status: DC | PRN

## 2019-05-06 NOTE — Progress Notes (Signed)
CC- hypertension/diabetes- Patient have not been checking her bp due to machine not working. Patient would like a new blood pressure machine and a new glucometer. Patient stated she do not know if she was supposed to still be taking the metformin and the Januvia. Patient stated she have not been taking the metformin but just wasn't to make sure. Patient also would like a refill on the torsemide.

## 2019-05-06 NOTE — Telephone Encounter (Signed)
Patient called regarding her Duloxetine 30mg  and her Carbamazepine 200mg . She did not know if she should take them both together or discontinue one. I looked at doctor note from her appointment on 03/17/19 and it stated "patient will continue taking Cymbalta and started on Tegretol twice a day and taking as prescribed." I informed her of that note. She also had an appointment scheduled for 05/05/19 that she did not make it in the office for (in office appt per doctor) and has another scheduled appointment on 05/21/19. Thank you.

## 2019-05-06 NOTE — Progress Notes (Signed)
Lab Results  Component Value Date   HGBA1C 5.8 (A) 11/11/2018   BP Readings from Last 3 Encounters:  05/05/19 134/74  05/03/19 (!) 153/87  04/17/19 (!) 113/59      Telemedicine Encounter- SOAP NOTE Established Patient  This telephone encounter was conducted with the patient's (or proxy's) verbal consent via audio telecommunications: yes/no: Yes Patient was instructed to have this encounter in a suitably private space; and to only have persons present to whom they give permission to participate. In addition, patient identity was confirmed by use of name plus two identifiers (DOB and address).  I discussed the limitations, risks, security and privacy concerns of performing an evaluation and management service by telephone and the availability of in person appointments. I also discussed with the patient that there may be a patient responsible charge related to this service. The patient expressed understanding and agreed to proceed.  I spent a total of TIME; 0 MIN TO 60 MIN: 10 minutes talking with the patient or their proxy.  No chief complaint on file. Hypertension and diabetes follow-up  Subjective   Brandi Gomez is a 48 y.o. female established patient. Telephone visit today for follow-up of hypertension and diabetes.  Has not been to the office in a while and has missed several appointments.  Present medications unclear.  Patient was supposed to be taking metformin and Januvia but she has been noncompliant.  Has a history of hypertension.  At some point recently she was started on Demadex 10 mg but unclear if she is taking it or not.  Patient requesting blood pressure machine and glucometer.  She needs an office visit to better assess her medical condition and chronic medical problems.  She has no other complaints or medical concerns today.  HPI   Patient Active Problem List   Diagnosis Date Noted  . History of recent stroke 08/06/2017  . OSA (obstructive sleep apnea)   .  Cryptogenic stroke ([place]) 05/20/2017  . Controlled type 2 diabetes mellitus without complication, without long-term current use of insulin ([place]) 05/08/2017  . Essential hypertension 07/27/2008  . Seizure disorder ([place]) 02/05/2007    Past Medical History:  Diagnosis Date  . Anemia   . Anxiety   . Bipolar 1 disorder ([place])   . Common migraine 05/19/2015  . Depression   . Diabetes mellitus, type II ([place])   . Hypertension   . Irritable bowel syndrome (IBS)   . Mild mental retardation   . Obesity   . Partial complex seizure disorder with intractable epilepsy ([place]) 05/12/2014  . Seizures ([place])    intractable, sz 08/23/17  . Sleep apnea   . Stroke ([place])   . Type II or unspecified type diabetes mellitus without mention of complication, not stated as uncontrolled     Current Outpatient Medications  Medication Sig Dispense Refill  . atorvastatin (LIPITOR) 10 MG tablet Take 1 tablet (10 mg total) by mouth daily at 6 PM. 90 tablet 3  . carbamazepine (TEGRETOL) 200 MG tablet Take 1.5 tablets (300 mg total) by mouth 2 (two) times daily at 10 AM and 5 PM. 90 tablet 4  . carvedilol (COREG) 3.125 MG tablet Take 1 tablet (3.125 mg total) by mouth daily. 30 tablet 1  . clotrimazole (LOTRIMIN) 1 % cream Apply to affected area 2 times daily 30 g 0  . DULoxetine (CYMBALTA) 30 MG capsule Take 1 capsule (30 mg total) by mouth daily. 30 capsule 6  . lacosamide (VIMPAT) 200 MG TABS tablet Take  1 tablet (200 mg total) by mouth 2 (two) times daily. 180 tablet 1  . naproxen (NAPROSYN) 375 MG tablet Take 1 tablet (375 mg total) by mouth 2 (two) times daily. 20 tablet 0  . sitaGLIPtin (JANUVIA) 50 MG tablet Take 1 tablet (50 mg total) by mouth daily. Schedule appointment for additional refills. 90 tablet 0  . torsemide (DEMADEX) 10 MG tablet Take 1 tablet (10 mg total) by mouth daily. 7 tablet 0  . Blood Pressure Monitor KIT Take blood pressure reading twice a day for the next 2 weeks and then once a day. 1 each 0   . glucose monitoring kit (FREESTYLE) monitoring kit 1 each by Does not apply route as needed for other. 1 each 1   No current facility-administered medications for this visit.     Allergies  Allergen Reactions  . Amoxicillin Itching    Has patient had a PCN reaction causing immediate rash, facial/tongue/throat swelling, SOB or lightheadedness with hypotension: yes Has patient had a PCN reaction causing severe rash involving mucus membranes or skin necrosis: no Has patient had a PCN reaction that required hospitalization: no Has patient had a PCN reaction occurring within the last 10 years: yes If all of the above answers are "NO", then may proceed with Cephalosporin use.  Marland Kitchen Lisinopril Swelling    Angioedema   . Hydrocodone Other (See Comments)    Depressed     Social History   Socioeconomic History  . Marital status: Single    Spouse name: [name]  . Number of children: 3  . Years of education: 92  . Highest education level: Not on file  Occupational History  . Occupation: disabled    Employer: DISABLED  Social Needs  . Financial resource strain: Not on file  . Food insecurity:    Worry: Not on file    Inability: Not on file  . Transportation needs:    Medical: Not on file    Non-medical: Not on file  Tobacco Use  . Smoking status: Never Smoker  . Smokeless tobacco: Never Used  Substance and Sexual Activity  . Alcohol use: No  . Drug use: No  . Sexual activity: Never  Lifestyle  . Physical activity:    Days per week: Not on file    Minutes per session: Not on file  . Stress: Not on file  Relationships  . Social connections:    Talks on phone: Not on file    Gets together: Not on file    Attends religious service: Not on file    Active member of club or organization: Not on file    Attends meetings of clubs or organizations: Not on file    Relationship status: Not on file  . Intimate partner violence:    Fear of current or ex partner: Not on file     Emotionally abused: Not on file    Physically abused: Not on file    Forced sexual activity: Not on file  Other Topics Concern  . Not on file  Social History Narrative   Patient lives at home with daughter.    Patient has 3 children.    Patient is right handed.    Patient has a high school education.    Patient is on disability   Patient drinks 2 cups of caffeine daily.    ROS  Objective   Vitals as reported by the patient: There were no vitals filed for this visit. Awake and oriented x3 in  no apparent respiratory distress while talking to me on the phone.  No coughing. Diagnoses and all orders for this visit:  Controlled type 2 diabetes mellitus without complication, without long-term current use of insulin (HCC) -     glucose monitoring kit (FREESTYLE) monitoring kit; 1 each by Does not apply route as needed for other.  Essential hypertension -     Blood Pressure Monitor KIT; Take blood pressure reading twice a day for the next 2 weeks and then once a day.  Office visit in the next several weeks.   I discussed the assessment and treatment plan with the patient. The patient was provided an opportunity to ask questions and all were answered. The patient agreed with the plan and demonstrated an understanding of the instructions.   The patient was advised to call back or seek an in-person evaluation if the symptoms worsen or if the condition fails to improve as anticipated.  I provided 10 minutes of non-face-to-face time during this encounter.  Horald Pollen, MD  Primary Care at [facility]

## 2019-05-06 NOTE — Patient Instructions (Signed)
° ° ° °  If you have lab work done today you will be contacted with your lab results within the next 2 weeks.  If you have not heard from us then please contact us. The fastest way to get your results is to register for My Chart. ° ° °IF you received an x-ray today, you will receive an invoice from [facility]. Please contact [facility] at [phone] with questions or concerns regarding your invoice.  ° °IF you received labwork today, you will receive an invoice from [facility]. Please contact [facility] at [phone] with questions or concerns regarding your invoice.  ° °Our billing staff will not be able to assist you with questions regarding bills from these companies. ° °You will be contacted with the lab results as soon as they are available. The fastest way to get your results is to activate your My Chart account. Instructions are located on the last page of this paperwork. If you have not heard from us regarding the results in 2 weeks, please contact this office. °  ° ° ° °

## 2019-05-07 ENCOUNTER — Other Ambulatory Visit: Payer: Self-pay | Admitting: Emergency Medicine

## 2019-05-07 ENCOUNTER — Telehealth: Payer: Self-pay | Admitting: Emergency Medicine

## 2019-05-07 DIAGNOSIS — E119 Type 2 diabetes mellitus without complications: Secondary | ICD-10-CM

## 2019-05-07 NOTE — Telephone Encounter (Signed)
Copied from [place] [id number]. Topic: Quick Communication - Rx Refill/Question >> May 07, 2019  9:19 AM Blase Mess A wrote: Medication: torsemide (DEMADEX) 10 MG tablet [[id number]]   Has the patient contacted their pharmacy? Yes  (Agent: If no, request that the patient contact the pharmacy for the refill.) (Agent: If yes, when and what did the pharmacy advise?)  Preferred Pharmacy (with phone number or street name):  [id number] - [place], [place] [phone] (Phone) [phone] (Fax)     Agent: Please be advised that RX refills may take up to 3 business days. We ask that you follow-up with your pharmacy.

## 2019-05-07 NOTE — Telephone Encounter (Signed)
I have called miss Minda back and informed her that it does look like the medication list was just reviewed at the pt tele-med visit on yesterday. If she still wanted to go over the pt medication list then she can call us back at [phone].

## 2019-05-08 ENCOUNTER — Encounter (HOSPITAL_COMMUNITY): Payer: Self-pay | Admitting: Emergency Medicine

## 2019-05-08 ENCOUNTER — Emergency Department (HOSPITAL_COMMUNITY): Payer: Medicare HMO

## 2019-05-08 ENCOUNTER — Emergency Department (HOSPITAL_COMMUNITY)
Admission: EM | Admit: 2019-05-08 | Discharge: 2019-05-08 | Disposition: A | Discharge status: Home / Self Care | Payer: Medicare HMO | Attending: Emergency Medicine | Admitting: Emergency Medicine

## 2019-05-08 ENCOUNTER — Emergency Department (HOSPITAL_COMMUNITY)
Admission: EM | Admit: 2019-05-08 | Discharge: 2019-05-08 | Disposition: A | Discharge status: Home / Self Care | Payer: Medicare HMO | Source: Home / Self Care | Attending: Emergency Medicine | Admitting: Emergency Medicine

## 2019-05-08 ENCOUNTER — Other Ambulatory Visit: Payer: Self-pay

## 2019-05-08 DIAGNOSIS — R410 Disorientation, unspecified: Secondary | ICD-10-CM | POA: Diagnosis not present

## 2019-05-08 DIAGNOSIS — R001 Bradycardia, unspecified: Secondary | ICD-10-CM | POA: Diagnosis not present

## 2019-05-08 DIAGNOSIS — I10 Essential (primary) hypertension: Secondary | ICD-10-CM | POA: Insufficient documentation

## 2019-05-08 DIAGNOSIS — E876 Hypokalemia: Secondary | ICD-10-CM | POA: Insufficient documentation

## 2019-05-08 DIAGNOSIS — F71 Moderate intellectual disabilities: Secondary | ICD-10-CM | POA: Insufficient documentation

## 2019-05-08 DIAGNOSIS — Z8673 Personal history of transient ischemic attack (TIA), and cerebral infarction without residual deficits: Secondary | ICD-10-CM | POA: Insufficient documentation

## 2019-05-08 DIAGNOSIS — R569 Unspecified convulsions: Secondary | ICD-10-CM

## 2019-05-08 DIAGNOSIS — G40901 Epilepsy, unspecified, not intractable, with status epilepticus: Secondary | ICD-10-CM | POA: Diagnosis not present

## 2019-05-08 DIAGNOSIS — Z79899 Other long term (current) drug therapy: Secondary | ICD-10-CM | POA: Insufficient documentation

## 2019-05-08 DIAGNOSIS — G40909 Epilepsy, unspecified, not intractable, without status epilepticus: Secondary | ICD-10-CM | POA: Diagnosis not present

## 2019-05-08 DIAGNOSIS — E119 Type 2 diabetes mellitus without complications: Secondary | ICD-10-CM | POA: Insufficient documentation

## 2019-05-08 DIAGNOSIS — F329 Major depressive disorder, single episode, unspecified: Secondary | ICD-10-CM | POA: Diagnosis not present

## 2019-05-08 DIAGNOSIS — S0990XA Unspecified injury of head, initial encounter: Secondary | ICD-10-CM | POA: Diagnosis not present

## 2019-05-08 LAB — CBC WITH DIFFERENTIAL/PLATELET
Abs Immature Granulocytes: 0.01 10*3/uL (ref 0.00–0.07)
Basophils Absolute: 0 10*3/uL (ref 0.0–0.1)
Basophils Relative: 0 %
Eosinophils Absolute: 0 10*3/uL (ref 0.0–0.5)
Eosinophils Relative: 1 %
HCT: 36 % (ref 36.0–46.0)
Hemoglobin: 11.3 g/dL — ABNORMAL LOW (ref 12.0–15.0)
Immature Granulocytes: 0 %
Lymphocytes Relative: 28 %
Lymphs Abs: 1.7 10*3/uL (ref 0.7–4.0)
MCH: 29.6 pg (ref 26.0–34.0)
MCHC: 31.4 g/dL (ref 30.0–36.0)
MCV: 94.2 fL (ref 80.0–100.0)
Monocytes Absolute: 0.4 10*3/uL (ref 0.1–1.0)
Monocytes Relative: 7 %
Neutro Abs: 3.8 10*3/uL (ref 1.7–7.7)
Neutrophils Relative %: 64 %
Platelets: 246 10*3/uL (ref 150–400)
RBC: 3.82 MIL/uL — ABNORMAL LOW (ref 3.87–5.11)
RDW: 14.6 % (ref 11.5–15.5)
WBC: 6 10*3/uL (ref 4.0–10.5)
nRBC: 0 % (ref 0.0–0.2)

## 2019-05-08 LAB — COMPREHENSIVE METABOLIC PANEL
ALT: 15 U/L (ref 0–44)
AST: 15 U/L (ref 15–41)
Albumin: 3.8 g/dL (ref 3.5–5.0)
Alkaline Phosphatase: 82 U/L (ref 38–126)
Anion gap: 10 (ref 5–15)
BUN: 17 mg/dL (ref 6–20)
CO2: 24 mmol/L (ref 22–32)
Calcium: 8.6 mg/dL — ABNORMAL LOW (ref 8.9–10.3)
Chloride: 106 mmol/L (ref 98–111)
Creatinine, Ser: 1.24 mg/dL — ABNORMAL HIGH (ref 0.44–1.00)
GFR calc Af Amer: 59 mL/min — ABNORMAL LOW (ref >60)
GFR calc non Af Amer: 51 mL/min — ABNORMAL LOW (ref >60)
Glucose, Bld: 122 mg/dL — ABNORMAL HIGH (ref 70–99)
Potassium: 3.3 mmol/L — ABNORMAL LOW (ref 3.5–5.1)
Sodium: 140 mmol/L (ref 135–145)
Total Bilirubin: 0.5 mg/dL (ref 0.3–1.2)
Total Protein: 7.9 g/dL (ref 6.5–8.1)

## 2019-05-08 LAB — MAGNESIUM: Magnesium: 2.1 mg/dL (ref 1.7–2.4)

## 2019-05-08 LAB — CARBAMAZEPINE LEVEL, TOTAL: Carbamazepine Lvl: 2.2 ug/mL — ABNORMAL LOW (ref 4.0–12.0)

## 2019-05-08 MED ORDER — DIPHENHYDRAMINE HCL 50 MG/ML IJ SOLN
25.0000 mg | Freq: Once | INTRAMUSCULAR | Status: DC
  Filled 2019-05-08: qty 1

## 2019-05-08 MED ORDER — CARBAMAZEPINE 200 MG PO TABS
300.0000 mg | ORAL_TABLET | Freq: Once | ORAL | Status: AC
  Administered 2019-05-08: 300 mg via ORAL
  Filled 2019-05-08: qty 1.5

## 2019-05-08 MED ORDER — LACOSAMIDE 50 MG PO TABS
200.0000 mg | ORAL_TABLET | Freq: Once | ORAL | Status: AC
  Administered 2019-05-08: 200 mg via ORAL
  Filled 2019-05-08: qty 4

## 2019-05-08 MED ORDER — METOCLOPRAMIDE HCL 5 MG/ML IJ SOLN
10.0000 mg | Freq: Once | INTRAMUSCULAR | Status: AC
  Administered 2019-05-08: 10 mg via INTRAVENOUS
  Filled 2019-05-08: qty 2

## 2019-05-08 MED ORDER — POTASSIUM CHLORIDE CRYS ER 20 MEQ PO TBCR
40.0000 meq | EXTENDED_RELEASE_TABLET | Freq: Once | ORAL | Status: AC
  Administered 2019-05-08: 40 meq via ORAL
  Filled 2019-05-08: qty 2

## 2019-05-08 NOTE — Discharge Instructions (Addendum)
It is very important to take your seizure medicines on time as prescribed.  You have been given your Tegretol and Vimpat tonight.  If you develop severe headache, fever, weakness or numbness in your extremities, or any other new/concerning symptoms then return to the ER for evaluation.  Otherwise call your neurologist for possible outpatient medicine adjustments.

## 2019-05-08 NOTE — ED Notes (Signed)
3rd call made to patients home. No answer, another message left.

## 2019-05-08 NOTE — ED Notes (Signed)
Bed: WA15 Expected date:  Expected time:  Means of arrival:  Comments: EMS- seizure 

## 2019-05-08 NOTE — ED Notes (Signed)
Patient refuses blood draw, states she thinks she's fine now and is ready to go home. MD made aware.

## 2019-05-08 NOTE — ED Notes (Signed)
Bed: WA21 Expected date:  Expected time:  Means of arrival:  Comments: EMS 48 yo female from home/seizure/increased seizures per family-now alert and oriented-HR 58 126/76

## 2019-05-08 NOTE — ED Notes (Signed)
Pt states friend, Will Camille Bal is picking her up

## 2019-05-08 NOTE — ED Triage Notes (Signed)
Per [facility] pt from home for unwitnessed seizure with incontinence. Vitals 115/70, 74HR, CBG 115

## 2019-05-08 NOTE — ED Provider Notes (Signed)
[facility] DEPT Provider Note: Georgena Spurling, MD, FACEP  CSN: [id number] MRN: [id number] ARRIVAL: 05/08/19 at 0437 ROOM: [place]  Seizures   HISTORY OF PRESENT ILLNESS  05/08/19 4:55 AM Brandi Gomez is a 48 y.o. female with a history of seizures, mild mental moderate retardation and suspected early Alzheimer's disease.  A family member called EMS this morning after witnessing an approximately 2-minute generalized tonic-clonic seizure.  He reported that her seizures have become more frequent over the past 4 to 6 weeks.  He also reports that her confusion is worsening and believes she needs to be placed in a facility.  It is unclear if she has been compliant with her medications.   The patient is somewhat confused as to why she is here.  She thought she was brought here because she felt short of breath earlier while lying in bed.  She does not remember being told she had a seizure.  She denies any pain or injury.   Past Medical History:  Diagnosis Date  . Anemia   . Anxiety   . Bipolar 1 disorder ([place])   . Common migraine 05/19/2015  . Depression   . Diabetes mellitus, type II ([place])   . Hypertension   . Irritable bowel syndrome (IBS)   . Mild mental retardation   . Obesity   . Partial complex seizure disorder with intractable epilepsy ([place]) 05/12/2014  . Seizures ([place])    intractable, sz 08/23/17  . Sleep apnea   . Stroke ([place])   . Type II or unspecified type diabetes mellitus without mention of complication, not stated as uncontrolled     Past Surgical History:  Procedure Laterality Date  . COLONOSCOPY     2012-normal , Dr Sharlett Iles  . ESOPHAGOGASTRODUODENOSCOPY     normal-Dr Patterson 2012  . LOOP RECORDER INSERTION N/A 05/30/2017   Procedure: Loop Recorder Insertion;  Surgeon: Constance Haw, MD;  Location: [facility];  Service: Cardiovascular;  Laterality: N/A;  . LOOP RECORDER REMOVAL N/A 03/04/2018   Procedure: LOOP RECORDER  REMOVAL;  Surgeon: Constance Haw, MD;  Location: [facility];  Service: Cardiovascular;  Laterality: N/A;  . MYRINGOTOMY WITH TUBE PLACEMENT    . MYRINGOTOMY WITH TUBE PLACEMENT Right 11/05/2017   Procedure: MYRINGOTOMY WITH TUBE PLACEMENT;  Surgeon: Melissa Montane, MD;  Location: [facility];  Service: ENT;  Laterality: Right;  right T Tube placement  . NASAL SINUS SURGERY    . TEE WITHOUT CARDIOVERSION N/A 05/30/2017   Procedure: TRANSESOPHAGEAL ECHOCARDIOGRAM (TEE);  Surgeon: Acie Fredrickson Wonda Cheng, MD;  Location: [facility];  Service: Cardiovascular;  Laterality: N/A;    Family History  Problem Relation Age of Onset  . Diabetes Mother        passed away from accidental death  . Hypertension Mother   . Bipolar disorder Father   . Diabetes Daughter   . Leukemia Daughter   . Ovarian cancer Maternal Aunt     Social History   Tobacco Use  . Smoking status: Never Smoker  . Smokeless tobacco: Never Used  Substance Use Topics  . Alcohol use: No  . Drug use: No    Prior to Admission medications   Medication Sig Start Date End Date Taking? Authorizing Provider  atorvastatin (LIPITOR) 10 MG tablet Take 1 tablet (10 mg total) by mouth daily at 6 PM. 04/19/19  Yes Sagardia, Ines Bloomer, MD  carbamazepine (TEGRETOL) 200 MG tablet Take 1.5 tablets (300 mg total)  by mouth 2 (two) times daily at 10 AM and 5 PM. 04/18/19  Yes [name], MD  carvedilol (COREG) 3.125 MG tablet Take 1 tablet (3.125 mg total) by mouth daily. 04/19/19  Yes Sagardia, Ines Bloomer, MD  clotrimazole (LOTRIMIN) 1 % cream Apply to affected area 2 times daily Patient taking differently: Apply 1 application topically 2 (two) times daily as needed. rash 05/03/19  Yes Wieters, Hallie C, PA-C  DULoxetine (CYMBALTA) 30 MG capsule Take 1 capsule (30 mg total) by mouth daily. 03/17/19  Yes Plovsky, Berneta Sages, MD  lacosamide (VIMPAT) 200 MG TABS tablet Take 1 tablet (200 mg total) by mouth 2 (two) times daily. 04/13/19  Yes [name], MD  metroNIDAZOLE (FLAGYL) 500 MG tablet Take 500 mg by mouth 3 (three) times daily. 05/04/19 05/11/19 Yes [provider]  naproxen (NAPROSYN) 375 MG tablet Take 1 tablet (375 mg total) by mouth 2 (two) times daily. 05/03/19  Yes Wieters, Hallie C, PA-C  sitaGLIPtin (JANUVIA) 50 MG tablet Take 1 tablet (50 mg total) by mouth daily. Schedule appointment for additional refills. 02/15/19  Yes Sagardia, Ines Bloomer, MD  torsemide (DEMADEX) 10 MG tablet Take 1 tablet (10 mg total) by mouth daily. 04/19/19  Yes SagardiaInes Bloomer, MD  Blood Pressure Monitor KIT Take blood pressure reading twice a day for the next 2 weeks and then once a day. 05/06/19   Horald Pollen, MD  glucose monitoring kit (FREESTYLE) monitoring kit 1 each by Does not apply route as needed for other. 05/06/19   Horald Pollen, MD    Allergies Amoxicillin; Lisinopril; and Hydrocodone   REVIEW OF SYSTEMS  Negative except as noted here or in the History of Present Illness.   PHYSICAL EXAMINATION  Initial Vital Signs Blood pressure 131/74, pulse 64, temperature 97.6 F (36.4 C), temperature source Oral, resp. rate 20, height '5\' 1"'$  (1.549 m), weight 122.5 kg, SpO2 100 %.  Examination General: Well-developed, well-nourished female in no acute distress; appearance consistent with age of record HENT: normocephalic; atraumatic Eyes: pupils equal, round and reactive to light; extraocular muscles intact Neck: supple Heart: regular rate and rhythm Lungs: clear to auscultation bilaterally Abdomen: soft; nondistended; nontender; bowel sounds present Extremities: No deformity; full range of motion; pulses normal; trace edema of lower legs Neurologic: Awake, alert and oriented to day of week, year and POTUS but not month; motor function intact in all extremities and symmetric; no facial droop Skin: Warm and dry Psychiatric: Normal mood and affect   RESULTS  Summary of this visit's results, reviewed by  myself:   EKG Interpretation  Date/Time:    Ventricular Rate:    PR Interval:    QRS Duration:   QT Interval:    QTC Calculation:   R Axis:     Text Interpretation:        Laboratory Studies: No results found for this or any previous visit (from the past 24 hour(s)). Imaging Studies: No results found.  ED COURSE and MDM  Nursing notes and initial vitals signs, including pulse oximetry, reviewed.  Vitals:   05/08/19 0438 05/08/19 0447 05/08/19 0501  BP:  131/74   Pulse:  64   Resp:  20   Temp:  97.6 F (36.4 C)   TempSrc:  Oral   SpO2: 98% 100%   Weight:  122.5 kg 123.4 kg  Height:  '5\' 1"'$  (1.549 m) '5\' 1"'$  (1.549 m)   5:32 AM The patient is refusing any diagnostic studies  and is requesting to be discharged back home.  At this time she seems capable of making her own decisions and the confusion described by paramedics has improved and was likely due in part to a postictal state.  PROCEDURES    ED DIAGNOSES     ICD-10-CM   1. Seizures ([place]) R56.9        Adeoluwa Silvers, MD 05/08/19 [phone]

## 2019-05-08 NOTE — ED Notes (Signed)
Patient returned from CT

## 2019-05-08 NOTE — ED Notes (Signed)
Patients home called to pick pt up, no answer. Message left.
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
## 2019-05-08 NOTE — ED Notes (Signed)
Patient taken to Ct scan

## 2019-05-09 ENCOUNTER — Other Ambulatory Visit: Payer: Self-pay | Admitting: Adult Health

## 2019-05-10 ENCOUNTER — Other Ambulatory Visit: Payer: Self-pay | Admitting: Neurology

## 2019-05-10 LAB — LACOSAMIDE: Lacosamide: 7.5 ug/mL (ref 5.0–10.0)

## 2019-05-10 MED ORDER — LACOSAMIDE 200 MG PO TABS: 200.0000 mg | ORAL_TABLET | Freq: Two times a day (BID) | ORAL | 0 refills | Status: DC

## 2019-05-10 NOTE — Telephone Encounter (Signed)
[facility] Verified LR: 04/19/2019 Qty: 180 Pending appointment: 07/14/2019 Brandi Gomez)

## 2019-05-11 ENCOUNTER — Encounter (HOSPITAL_COMMUNITY): Payer: Self-pay | Admitting: Emergency Medicine

## 2019-05-11 ENCOUNTER — Ambulatory Visit: Payer: Self-pay | Admitting: *Deleted

## 2019-05-11 ENCOUNTER — Other Ambulatory Visit: Payer: Self-pay | Admitting: Emergency Medicine

## 2019-05-11 ENCOUNTER — Other Ambulatory Visit: Payer: Self-pay

## 2019-05-11 ENCOUNTER — Ambulatory Visit (HOSPITAL_COMMUNITY)
Admission: EM | Admit: 2019-05-11 | Discharge: 2019-05-11 | Disposition: A | Discharge status: Home / Self Care | Payer: Medicare HMO | Attending: Family Medicine | Admitting: Family Medicine

## 2019-05-11 DIAGNOSIS — B370 Candidal stomatitis: Secondary | ICD-10-CM | POA: Diagnosis not present

## 2019-05-11 DIAGNOSIS — I10 Essential (primary) hypertension: Secondary | ICD-10-CM

## 2019-05-11 DIAGNOSIS — J392 Other diseases of pharynx: Secondary | ICD-10-CM

## 2019-05-11 MED ORDER — PREDNISONE 20 MG PO TABS: 20.0000 mg | ORAL_TABLET | Freq: Every day | ORAL | 0 refills | Status: DC

## 2019-05-11 MED ORDER — NYSTATIN 100000 UNIT/ML MT SUSP: 500000.0000 [IU] | Freq: Four times a day (QID) | OROMUCOSAL | 0 refills | Status: DC

## 2019-05-11 NOTE — Telephone Encounter (Signed)
This was already discussed with patient during telemedicine visit.  This medication was prescribed in the emergency room, not by me, and not sure she even needs it.  Do not refill at this time.  Thanks.

## 2019-05-11 NOTE — Telephone Encounter (Signed)
Do not approve this medication.  This was already addressed during telemedicine visit.  This medication was prescribed in the emergency room, not by me, and I do not think she even needs it.  Thanks.

## 2019-05-11 NOTE — ED Triage Notes (Signed)
Pt presents to [facility] for throat tightness starting earlier today.

## 2019-05-11 NOTE — Telephone Encounter (Signed)
Copied from [place] [id number]. Topic: Quick Communication - Rx Refill/Question >> May 11, 2019 11:16 AM Mathis Bud wrote: Medication:  torsemide (DEMADEX) 10 MG tablet   Has the patient contacted their pharmacy? No more refills. Patient is saying her feet and ankles are very swollen.   Preferred Pharmacy (with phone number or street name):  [id number] - [place], [place] [phone] (Phone) [phone] (Fax)   Agent: Please be advised that RX refills may take up to 3 business days. We ask that you follow-up with your pharmacy.

## 2019-05-11 NOTE — Telephone Encounter (Signed)
Has apt tomorrow with Dr. Mitchel Honour.

## 2019-05-11 NOTE — Telephone Encounter (Signed)
Okay to refill this medication? rx was sent for #7 on 04/19/19. Or does pt need to be seen in the office for refill?

## 2019-05-11 NOTE — Discharge Instructions (Addendum)
Hold your medication tonight and until after you talk to your doctor tomorrow morning. Take prednisone once a day for 3 days. Use mouth wash 4 times a day - do NOT swallow. Go to the ER if you develop worse swelling, lip swelling, or have difficulty breathing.

## 2019-05-11 NOTE — Telephone Encounter (Signed)
Pt reports edema both legs; feet, ankles, "A little up both legs." States has been out of her torsemide for 2-3 weeks. Denies redness, warmth, denies SOB. States noon-pitting but rates at 8/10. States elevating does not help. Denies pain, tenderness. Called initially to request refill of torsemide. Pt has appt 06/01/2019,Call transferred to practice, [facility], for consideration of earlier appt. Pt does not have access to computer.  Reason for Disposition . [1] MODERATE leg swelling (e.g., swelling extends up to knees) AND [2] new onset or worsening  Answer Assessment - Initial Assessment Questions 1. ONSET: "When did the swelling start?" (e.g., minutes, hours, days)     Sunday 2. LOCATION: "What part of the leg is swollen?"  "Are both legs swollen or just one leg?"     Ankles and feet, mild to knee 3. SEVERITY: "How bad is the swelling?" (e.g., localized; mild, moderate, severe)  - Localized - small area of swelling localized to one leg  - MILD pedal edema - swelling limited to foot and ankle, pitting edema < 1/4 inch (6 mm) deep, rest and elevation eliminate most or all swelling  - MODERATE edema - swelling of lower leg to knee, pitting edema > 1/4 inch (6 mm) deep, rest and elevation only partially reduce swelling  - SEVERE edema - swelling extends above knee, facial or hand swelling present      moderate 4. REDNESS: "Does the swelling look red or infected?"     no 5. PAIN: "Is the swelling painful to touch?" If so, ask: "How painful is it?"   (Scale 1-10; mild, moderate or severe)     no 6. FEVER: "Do you have a fever?" If so, ask: "What is it, how was it measured, and when did it start?"      Feels a little warm 7. CAUSE: "What do you think is causing the leg swelling?"     Happens occasionally 8. MEDICAL HISTORY: "Do you have a history of heart failure, kidney disease, liver failure, or cancer?"     DM, CVA 9. RECURRENT SYMPTOM: "Have you had leg swelling before?" If so, ask: "When was the  last time?" "What happened that time?"    Yes, "A while ago"  10. OTHER SYMPTOMS: "Do you have any other symptoms?" (e.g., chest pain, difficulty breathing)      no  Protocols used: LEG SWELLING AND EDEMA-A-AH

## 2019-05-11 NOTE — ED Provider Notes (Signed)
[place]    CSN: [id number] Arrival date & time: 05/11/19  1339     History   Chief Complaint Chief Complaint  Patient presents with  . Sore Throat    HPI Brandi Gomez is a 48 y.o. female with history of seizures, type 2 diabetes, and self-reported "memory issues "presenting for throat tightness.  Patient states that this started earlier today.  Patient thinks that she was prescribed a new seizure medication, possibly carbamazepine, within the past week.  No known new medications, foods, beverages.  Patient states that she had angioedema reaction to lisinopril, states that this feels similar.  Patient denies lip, tongue swelling.  Patient denies dysphagia, odynophagia, choking, dysphasia, wheezing, shortness of breath, cough, chest pain, rash.  Better was able to bring patient out to vehicle to speak with friend, who corroborated story and verbalized understanding of plan.  Patient states that she has routine follow-up with her PCP tomorrow.  Patient's friend states that he has a phone number for patient psychiatric provider who prescribed medication.   Past Medical History:  Diagnosis Date  . Anemia   . Anxiety   . Bipolar 1 disorder ([place])   . Common migraine 05/19/2015  . Depression   . Diabetes mellitus, type II ([place])   . Hypertension   . Irritable bowel syndrome (IBS)   . Mild mental retardation   . Obesity   . Partial complex seizure disorder with intractable epilepsy ([place]) 05/12/2014  . Seizures ([place])    intractable, sz 08/23/17  . Sleep apnea   . Stroke ([place])   . Type II or unspecified type diabetes mellitus without mention of complication, not stated as uncontrolled     Patient Active Problem List   Diagnosis Date Noted  . History of recent stroke 08/06/2017  . OSA (obstructive sleep apnea)   . Cryptogenic stroke ([place]) 05/20/2017  . Controlled type 2 diabetes mellitus without complication, without long-term current use of insulin ([place]) 05/08/2017   . Essential hypertension 07/27/2008  . Seizure disorder ([place]) 02/05/2007    Past Surgical History:  Procedure Laterality Date  . COLONOSCOPY     2012-normal , Dr Sharlett Iles  . ESOPHAGOGASTRODUODENOSCOPY     normal-Dr Patterson 2012  . LOOP RECORDER INSERTION N/A 05/30/2017   Procedure: Loop Recorder Insertion;  Surgeon: Constance Haw, MD;  Location: [facility];  Service: Cardiovascular;  Laterality: N/A;  . LOOP RECORDER REMOVAL N/A 03/04/2018   Procedure: LOOP RECORDER REMOVAL;  Surgeon: Constance Haw, MD;  Location: [facility];  Service: Cardiovascular;  Laterality: N/A;  . MYRINGOTOMY WITH TUBE PLACEMENT    . MYRINGOTOMY WITH TUBE PLACEMENT Right 11/05/2017   Procedure: MYRINGOTOMY WITH TUBE PLACEMENT;  Surgeon: Melissa Montane, MD;  Location: [facility];  Service: ENT;  Laterality: Right;  right T Tube placement  . NASAL SINUS SURGERY    . TEE WITHOUT CARDIOVERSION N/A 05/30/2017   Procedure: TRANSESOPHAGEAL ECHOCARDIOGRAM (TEE);  Surgeon: Acie Fredrickson Wonda Cheng, MD;  Location: [facility];  Service: Cardiovascular;  Laterality: N/A;    OB History   No obstetric history on file.      Home Medications    Prior to Admission medications   Medication Sig Start Date End Date Taking? Authorizing Provider  atorvastatin (LIPITOR) 10 MG tablet Take 1 tablet (10 mg total) by mouth daily at 6 PM. 04/19/19   Sagardia, Ines Bloomer, MD  Blood Pressure Monitor KIT Take blood pressure reading twice a day for the next  2 weeks and then once a day. 05/06/19   Horald Pollen, MD  carbamazepine (TEGRETOL) 200 MG tablet Take 1.5 tablets (300 mg total) by mouth 2 (two) times daily at 10 AM and 5 PM. 04/18/19   [name], MD  carvedilol (COREG) 3.125 MG tablet Take 1 tablet (3.125 mg total) by mouth daily. 04/19/19   Horald Pollen, MD  clotrimazole (LOTRIMIN) 1 % cream Apply to affected area 2 times daily Patient taking differently: Apply 1 application topically 2  (two) times daily as needed. rash 05/03/19   Wieters, Hallie C, PA-C  DULoxetine (CYMBALTA) 30 MG capsule Take 1 capsule (30 mg total) by mouth daily. 03/17/19   Plovsky, Berneta Sages, MD  glucose monitoring kit (FREESTYLE) monitoring kit 1 each by Does not apply route as needed for other. 05/06/19   Horald Pollen, MD  lacosamide (VIMPAT) 200 MG TABS tablet Take 1 tablet (200 mg total) by mouth 2 (two) times daily. 05/10/19   [name], MD  metroNIDAZOLE (FLAGYL) 500 MG tablet Take 500 mg by mouth 3 (three) times daily. 05/04/19 05/11/19  [provider]  naproxen (NAPROSYN) 375 MG tablet Take 1 tablet (375 mg total) by mouth 2 (two) times daily. 05/03/19   Wieters, Hallie C, PA-C  nystatin (MYCOSTATIN) 100000 UNIT/ML suspension Take 5 mLs (500,000 Units total) by mouth 4 (four) times daily. 05/11/19   Hall-Potvin, [place], PA-C  predniSONE (DELTASONE) 20 MG tablet Take 1 tablet (20 mg total) by mouth daily. 05/11/19   Hall-Potvin, [place], PA-C  sitaGLIPtin (JANUVIA) 50 MG tablet Take 1 tablet (50 mg total) by mouth daily. Schedule appointment for additional refills. 02/15/19   Horald Pollen, MD  torsemide ([place]) 10 MG tablet TAKE 1 TABLET BY MOUTH EVERY DAY 05/11/19   Horald Pollen, MD    Family History Family History  Problem Relation Age of Onset  . Diabetes Mother        passed away from accidental death  . Hypertension Mother   . Bipolar disorder Father   . Diabetes Daughter   . Leukemia Daughter   . Ovarian cancer Maternal Aunt     Social History Social History   Tobacco Use  . Smoking status: Never Smoker  . Smokeless tobacco: Never Used  Substance Use Topics  . Alcohol use: No  . Drug use: No     Allergies   Amoxicillin; Lisinopril; and Hydrocodone   Review of Systems As per HPI   Physical Exam Triage Vital Signs ED Triage Vitals  Enc Vitals Group     BP 05/11/19 1401 140/87     Pulse Rate 05/11/19 1401 65     Resp 05/11/19 1401 18      Temp 05/11/19 1401 99.1 F (37.3 C)     Temp Source 05/11/19 1401 Oral     SpO2 05/11/19 1401 98 %     Weight --      Height --      Head Circumference --      Peak Flow --      Pain Score 05/11/19 1403 0     Pain Loc --      Pain Edu? --      Excl. in [place]? --    No data found.  Updated Vital Signs BP 140/87 (BP Location: Left Wrist)   Pulse 65   Temp 99.1 F (37.3 C) (Oral)   Resp 18   SpO2 98%   Visual Acuity Right Eye Distance:  Left Eye Distance:   Bilateral Distance:    Right Eye Near:   Left Eye Near:    Bilateral Near:     Physical Exam Constitutional:      General: She is not in acute distress.    Appearance: She is obese.  HENT:     Head: Normocephalic and atraumatic.     Right Ear: Tympanic membrane and ear canal normal.     Left Ear: Tympanic membrane and ear canal normal.     Nose: No congestion or rhinorrhea.     Mouth/Throat:     Mouth: Mucous membranes are moist.     Pharynx: Uvula midline. No pharyngeal swelling, oropharyngeal exudate, posterior oropharyngeal erythema or uvula swelling.     Tonsils: No tonsillar exudate or tonsillar abscesses.     Comments: Thick, white coating over tongue that does not scrape off.  No bugle or soft palate lesions. Eyes:     General: No scleral icterus.    Pupils: Pupils are equal, round, and reactive to light.  Neck:     Musculoskeletal: Normal range of motion and neck supple.     Thyroid: No thyromegaly.     Comments: Nontender to palpation Cardiovascular:     Rate and Rhythm: Normal rate.  Pulmonary:     Effort: Pulmonary effort is normal. No respiratory distress.     Breath sounds: No wheezing.  Lymphadenopathy:     Cervical: No cervical adenopathy.  Skin:    Capillary Refill: Capillary refill takes less than 2 seconds.     Coloration: Skin is not pale.     Findings: No rash.  Neurological:     Mental Status: She is alert and oriented to person, place, and time.      UC Treatments / Results   Labs (all labs ordered are listed, but only abnormal results are displayed) Labs Reviewed - No data to display  EKG None  Radiology No results found.  Procedures Procedures (including critical care time)  Medications Ordered in UC Medications - No data to display  Initial Impression / Assessment and Plan / UC Course  I have reviewed the triage vital signs and the nursing notes.  Pertinent labs & imaging results that were available during my care of the patient were reviewed by me and considered in my medical decision making (see chart for details).     48 year old female with history of seizures, type 2 diabetes, and self-reported memory issues presenting for tightness.  Incidental finding of oral thrush, which could be contributing to discomfort: We will treat with mouth rinse.  Patient stable in office, no acute distress.  Will treat with short-term steroid: Discussed that this may temporarily elevate sugars.  We will also hold carbamazepine tonight and tomorrow morning until she speaks with her prescribing provider.  Discussed that this may lower seizure threshold.  Shared decision making with patient and her friend was performed: All parties agreeable to plan.  Precautions discussed, patient and friend verbalized understanding.  Patient to keep appointment with PCP tomorrow and call her Tegretol prescribing provider when she gets home today to inform of medication issue. Final Clinical Impressions(s) / UC Diagnoses   Final diagnoses:  Oral thrush  Swelling of throat     Discharge Instructions     Hold your medication tonight and until after you talk to your doctor tomorrow morning. Take prednisone once a day for 3 days. Use mouth wash 4 times a day - do NOT swallow. Go to the  ER if you develop worse swelling, lip swelling, or have difficulty breathing.     ED Prescriptions    Medication Sig Dispense Auth. Provider   predniSONE (DELTASONE) 20 MG tablet Take 1 tablet  (20 mg total) by mouth daily. 3 tablet Hall-Potvin, [place], PA-C   nystatin (MYCOSTATIN) 100000 UNIT/ML suspension Take 5 mLs (500,000 Units total) by mouth 4 (four) times daily. 60 mL Hall-Potvin, [place], PA-C     Controlled Substance Prescriptions [place] Controlled Substance Registry consulted? Not Applicable   Quincy Sheehan, [place] 05/11/19 2059

## 2019-05-11 NOTE — Telephone Encounter (Signed)
Patient was sent in 7 tabs of torsemide on 04/19/2019 not sure if can be approved to a refill for this medication. You approved it on 04/19/2019 so please advise.

## 2019-05-12 ENCOUNTER — Telehealth (INDEPENDENT_AMBULATORY_CARE_PROVIDER_SITE_OTHER): Payer: Medicare HMO | Admitting: Emergency Medicine

## 2019-05-12 ENCOUNTER — Other Ambulatory Visit: Payer: Self-pay

## 2019-05-12 ENCOUNTER — Encounter: Payer: Self-pay | Admitting: Emergency Medicine

## 2019-05-12 ENCOUNTER — Encounter (HOSPITAL_COMMUNITY): Payer: Self-pay

## 2019-05-12 ENCOUNTER — Other Ambulatory Visit: Payer: Self-pay | Admitting: *Deleted

## 2019-05-12 ENCOUNTER — Emergency Department (HOSPITAL_COMMUNITY)
Admission: EM | Admit: 2019-05-12 | Discharge: 2019-05-13 | Disposition: A | Discharge status: Home / Self Care | Payer: Medicare HMO | Attending: Emergency Medicine | Admitting: Emergency Medicine

## 2019-05-12 ENCOUNTER — Ambulatory Visit: Payer: Self-pay | Admitting: *Deleted

## 2019-05-12 VITALS — Ht 61.0 in | Wt 270.0 lb

## 2019-05-12 DIAGNOSIS — R410 Disorientation, unspecified: Secondary | ICD-10-CM | POA: Diagnosis not present

## 2019-05-12 DIAGNOSIS — E119 Type 2 diabetes mellitus without complications: Secondary | ICD-10-CM

## 2019-05-12 DIAGNOSIS — F918 Other conduct disorders: Secondary | ICD-10-CM | POA: Diagnosis not present

## 2019-05-12 DIAGNOSIS — Z79899 Other long term (current) drug therapy: Secondary | ICD-10-CM | POA: Insufficient documentation

## 2019-05-12 DIAGNOSIS — F419 Anxiety disorder, unspecified: Secondary | ICD-10-CM | POA: Diagnosis not present

## 2019-05-12 DIAGNOSIS — R443 Hallucinations, unspecified: Secondary | ICD-10-CM | POA: Diagnosis not present

## 2019-05-12 DIAGNOSIS — Z8673 Personal history of transient ischemic attack (TIA), and cerebral infarction without residual deficits: Secondary | ICD-10-CM | POA: Diagnosis not present

## 2019-05-12 DIAGNOSIS — Z03818 Encounter for observation for suspected exposure to other biological agents ruled out: Secondary | ICD-10-CM | POA: Diagnosis not present

## 2019-05-12 DIAGNOSIS — G40211 Localization-related (focal) (partial) symptomatic epilepsy and epileptic syndromes with complex partial seizures, intractable, with status epilepticus: Secondary | ICD-10-CM | POA: Diagnosis not present

## 2019-05-12 DIAGNOSIS — Z7984 Long term (current) use of oral hypoglycemic drugs: Secondary | ICD-10-CM | POA: Insufficient documentation

## 2019-05-12 DIAGNOSIS — Z20828 Contact with and (suspected) exposure to other viral communicable diseases: Secondary | ICD-10-CM | POA: Insufficient documentation

## 2019-05-12 DIAGNOSIS — F319 Bipolar disorder, unspecified: Secondary | ICD-10-CM | POA: Diagnosis not present

## 2019-05-12 DIAGNOSIS — I10 Essential (primary) hypertension: Secondary | ICD-10-CM | POA: Insufficient documentation

## 2019-05-12 DIAGNOSIS — F69 Unspecified disorder of adult personality and behavior: Secondary | ICD-10-CM

## 2019-05-12 DIAGNOSIS — D649 Anemia, unspecified: Secondary | ICD-10-CM | POA: Diagnosis not present

## 2019-05-12 DIAGNOSIS — G4733 Obstructive sleep apnea (adult) (pediatric): Secondary | ICD-10-CM | POA: Diagnosis not present

## 2019-05-12 DIAGNOSIS — T7840XA Allergy, unspecified, initial encounter: Secondary | ICD-10-CM | POA: Diagnosis present

## 2019-05-12 MED ORDER — BLOOD GLUCOSE METER KIT: PACK | 0 refills | Status: DC

## 2019-05-12 MED ORDER — BLOOD GLUCOSE MONITORING SUPPL KIT: PACK | 0 refills | Status: DC

## 2019-05-12 MED ORDER — ATORVASTATIN CALCIUM 10 MG PO TABS: 10.0000 mg | ORAL_TABLET | Freq: Every day | ORAL | 3 refills | Status: DC

## 2019-05-12 MED ORDER — BLOOD PRESSURE MONITOR KIT: PACK | 0 refills | Status: DC

## 2019-05-12 NOTE — Telephone Encounter (Signed)
Call received regarding [name] and information below.  Family reported delusions, mood swings, hallucinations for the past few weeks but have been worsening. Primary care provider is Dr. Mitchel Honour, but appears she is followed by Dr. Casimiro Needle as treating psychiatrist.  Recommended that nurse advise family to contact her psychiatrist about these symptoms, however if acute worsening of symptoms today, should be seen through behavioral health or emergency room.

## 2019-05-12 NOTE — Telephone Encounter (Signed)
Hinton Dyer, RN from [facility] called regarding pt having adverse reations from her medication.  She is taking Cymbalta  30 mg daily. Her family thinks it is causing her to hallucinate, have mood swings and have delusions. Her b/p is 125/80 HR  69  Temp 97.7  O2 Sat 98 and respirations 16. She has not been sleeping, getting up in the middle of the night and going outside. She has not been violent or abusive. But her mate caught her with her medicine spread out on the bed today, stating she had not had her medication. She is given her medication by her family daily. And he is not sure if she had taken any extra medication. Per Hinton Dyer, she is alert and oriented a X's at this time. On call provider (Dr. Carlota Raspberry) notified regarding her next dose of medication. He suggested to speak with her psychiatrist Dr.Plovsky . Hinton Dyer will reach out to him. Advised also if he can not be reached, she needs to be seen at [facility]. Hinton Dyer and mate voiced understanding. Routing to the practice of PCP.  Reason for Disposition . Caller has URGENT medication question about med that PCP prescribed and triager unable to answer question  Answer Assessment - Initial Assessment Questions 1. SYMPTOMS: "Do you have any symptoms?"     Yes  2. SEVERITY: If symptoms are present, ask "Are they mild, moderate or severe?"     severe  Protocols used: MEDICATION QUESTION CALL-A-AH

## 2019-05-12 NOTE — Progress Notes (Signed)
  Telemedicine Encounter- SOAP NOTE Established Patient  This telephone encounter was conducted with the patient's (or proxy's) verbal consent via audio telecommunications: yes/no: Yes Patient was instructed to have this encounter in a suitably private space; and to only have persons present to whom they give permission to participate. In addition, patient identity was confirmed by use of name plus two identifiers (DOB and address).  I discussed the limitations, risks, security and privacy concerns of performing an evaluation and management service by telephone and the availability of in person appointments. I also discussed with the patient that there may be a patient responsible charge related to this service. The patient expressed understanding and agreed to proceed.  I spent a total of TIME; 0 MIN TO 60 MIN: 10 minutes talking with the patient or their proxy.  No chief complaint on file. Medication refill  Subjective   Brandi Gomez is a 48 y.o. female established patient. Telephone visit today for evaluation of chronic medical conditions. Patient has a history of dyslipidemia needs refill for atorvastatin.  Also requesting prescription for blood pressure and glucose monitoring machines.  Has no complaints today.  HPI   Patient Active Problem List   Diagnosis Date Noted  . History of recent stroke 08/06/2017  . OSA (obstructive sleep apnea)   . Cryptogenic stroke ([facility]) 05/20/2017  . Controlled type 2 diabetes mellitus without complication, without long-term current use of insulin ([facility]) 05/08/2017  . Essential hypertension 07/27/2008  . Seizure disorder ([facility]) 02/05/2007    Past Medical History:  Diagnosis Date  . Anemia   . Anxiety   . Bipolar 1 disorder ([facility])   . Common migraine 05/19/2015  . Depression   . Diabetes mellitus, type II ([facility])   . Hypertension   . Irritable bowel syndrome (IBS)   . Mild mental retardation   . Obesity   . Partial complex seizure  disorder with intractable epilepsy ([facility]) 05/12/2014  . Seizures ([facility])    intractable, sz 08/23/17  . Sleep apnea   . Stroke ([facility])   . Type II or unspecified type diabetes mellitus without mention of complication, not stated as uncontrolled     Current Outpatient Medications  Medication Sig Dispense Refill  . atorvastatin (LIPITOR) 10 MG tablet Take 1 tablet (10 mg total) by mouth daily at 6 PM. 90 tablet 3  . Blood Pressure Monitor KIT Take blood pressure reading twice a day for the next 2 weeks and then once a day. 1 each 0  . carvedilol (COREG) 3.125 MG tablet Take 1 tablet (3.125 mg total) by mouth daily. 30 tablet 1  . clotrimazole (LOTRIMIN) 1 % cream Apply to affected area 2 times daily (Patient taking differently: Apply 1 application topically 2 (two) times daily as needed. rash) 30 g 0  . DULoxetine (CYMBALTA) 30 MG capsule Take 1 capsule (30 mg total) by mouth daily. 30 capsule 6  . lacosamide (VIMPAT) 200 MG TABS tablet Take 1 tablet (200 mg total) by mouth 2 (two) times daily. 180 tablet 0  . naproxen (NAPROSYN) 375 MG tablet Take 1 tablet (375 mg total) by mouth 2 (two) times daily. 20 tablet 0  . nystatin (MYCOSTATIN) 100000 UNIT/ML suspension Take 5 mLs (500,000 Units total) by mouth 4 (four) times daily. 60 mL 0  . predniSONE (DELTASONE) 20 MG tablet Take 1 tablet (20 mg total) by mouth daily. 3 tablet 0  . sitaGLIPtin (JANUVIA) 50 MG tablet Take 1 tablet (50 mg total) by mouth daily.   Schedule appointment for additional refills. 90 tablet 0  . torsemide (DEMADEX) 10 MG tablet TAKE 1 TABLET BY MOUTH EVERY DAY 30 tablet 0  . carbamazepine (TEGRETOL) 200 MG tablet Take 1.5 tablets (300 mg total) by mouth 2 (two) times daily at 10 AM and 5 PM. (Patient not taking: Reported on 05/12/2019) 90 tablet 4  . glucose monitoring kit (FREESTYLE) monitoring kit 1 each by Does not apply route as needed for other. 1 each 1   No current facility-administered medications for this visit.      Allergies  Allergen Reactions  . Amoxicillin Itching    Has patient had a PCN reaction causing immediate rash, facial/tongue/throat swelling, SOB or lightheadedness with hypotension: yes Has patient had a PCN reaction causing severe rash involving mucus membranes or skin necrosis: no Has patient had a PCN reaction that required hospitalization: no Has patient had a PCN reaction occurring within the last 10 years: yes If all of the above answers are "NO", then may proceed with Cephalosporin use.  Marland Kitchen Lisinopril Swelling    Angioedema   . Hydrocodone Other (See Comments)    Depressed   . Tegretol [Carbamazepine] Swelling    Throat swells    Social History   Socioeconomic History  . Marital status: Single    Spouse name: [name]  . Number of children: 3  . Years of education: 22  . Highest education level: Not on file  Occupational History  . Occupation: disabled    Employer: DISABLED  Social Needs  . Financial resource strain: Not on file  . Food insecurity:    Worry: Not on file    Inability: Not on file  . Transportation needs:    Medical: Not on file    Non-medical: Not on file  Tobacco Use  . Smoking status: Never Smoker  . Smokeless tobacco: Never Used  Substance and Sexual Activity  . Alcohol use: No  . Drug use: No  . Sexual activity: Never  Lifestyle  . Physical activity:    Days per week: Not on file    Minutes per session: Not on file  . Stress: Not on file  Relationships  . Social connections:    Talks on phone: Not on file    Gets together: Not on file    Attends religious service: Not on file    Active member of club or organization: Not on file    Attends meetings of clubs or organizations: Not on file    Relationship status: Not on file  . Intimate partner violence:    Fear of current or ex partner: Not on file    Emotionally abused: Not on file    Physically abused: Not on file    Forced sexual activity: Not on file  Other Topics Concern  .  Not on file  Social History Narrative   Patient lives at home with daughter.    Patient has 3 children.    Patient is right handed.    Patient has a high school education.    Patient is on disability   Patient drinks 2 cups of caffeine daily.    Review of Systems  Constitutional: Negative.  Negative for chills and fever.  HENT: Negative for congestion, nosebleeds and sore throat.   Eyes: Negative.  Negative for blurred vision and double vision.  Respiratory: Negative.  Negative for cough and shortness of breath.   Cardiovascular: Negative for chest pain and palpitations.  Gastrointestinal: Negative.  Negative for abdominal  pain, diarrhea, nausea and vomiting.  Genitourinary: Negative.  Negative for dysuria and hematuria.  Musculoskeletal: Negative.  Negative for back pain, myalgias and neck pain.  Skin: Negative.  Negative for rash.  Neurological: Negative for dizziness and headaches.  All other systems reviewed and are negative.   Objective   Vitals as reported by the patient: Today's Vitals   05/12/19 1100  Weight: 270 lb (122.5 kg)  Height: 5' 1" (1.549 m)    Diagnoses and all orders for this visit:  Essential hypertension -     atorvastatin (LIPITOR) 10 MG tablet; Take 1 tablet (10 mg total) by mouth daily at 6 PM. -     Blood Pressure Monitor KIT; Take blood pressure reading twice a day for the next 2 weeks and then once a day.  Clinically stable.  No medical concerns identified during this visit. Office visit in 3 months.  Continue present medications.  No changes.   I discussed the assessment and treatment plan with the patient. The patient was provided an opportunity to ask questions and all were answered. The patient agreed with the plan and demonstrated an understanding of the instructions.   The patient was advised to call back or seek an in-person evaluation if the symptoms worsen or if the condition fails to improve as anticipated.  I provided 10 minutes of  non-face-to-face time during this encounter.   Jose , MD  [facility]  

## 2019-05-12 NOTE — Progress Notes (Signed)
Spoke to patient , she will call back and make her 3 month appnt

## 2019-05-12 NOTE — ED Triage Notes (Signed)
Pt states that she told a nurse today that she wasn't acting right, she's agitated and not walking right, she thinks she's allergic to her cymbalta but she's been taking it for a year

## 2019-05-12 NOTE — Progress Notes (Signed)
Called patient to triage for appointment. Patient states she needs a new glucose monitor kit and blood pressure monitor because they do not work anymore. Patient states glucose monitor prescription sent to pharmacy, insurance will not pay for it.

## 2019-05-13 ENCOUNTER — Encounter (HOSPITAL_COMMUNITY): Payer: Self-pay

## 2019-05-13 ENCOUNTER — Observation Stay (HOSPITAL_COMMUNITY)
Admission: RE | Admit: 2019-05-13 | Discharge: 2019-05-13 | Disposition: A | Discharge status: Home / Self Care | Payer: Medicare HMO | Source: Intra-hospital | Attending: Psychiatry | Admitting: Psychiatry

## 2019-05-13 DIAGNOSIS — F319 Bipolar disorder, unspecified: Principal | ICD-10-CM | POA: Diagnosis present

## 2019-05-13 DIAGNOSIS — R5383 Other fatigue: Secondary | ICD-10-CM | POA: Diagnosis not present

## 2019-05-13 DIAGNOSIS — Z79899 Other long term (current) drug therapy: Secondary | ICD-10-CM | POA: Diagnosis not present

## 2019-05-13 DIAGNOSIS — Z818 Family history of other mental and behavioral disorders: Secondary | ICD-10-CM | POA: Diagnosis not present

## 2019-05-13 DIAGNOSIS — F70 Mild intellectual disabilities: Secondary | ICD-10-CM | POA: Diagnosis not present

## 2019-05-13 DIAGNOSIS — F419 Anxiety disorder, unspecified: Secondary | ICD-10-CM | POA: Diagnosis not present

## 2019-05-13 DIAGNOSIS — Z8249 Family history of ischemic heart disease and other diseases of the circulatory system: Secondary | ICD-10-CM | POA: Diagnosis not present

## 2019-05-13 DIAGNOSIS — Z888 Allergy status to other drugs, medicaments and biological substances status: Secondary | ICD-10-CM | POA: Insufficient documentation

## 2019-05-13 DIAGNOSIS — I10 Essential (primary) hypertension: Secondary | ICD-10-CM | POA: Insufficient documentation

## 2019-05-13 DIAGNOSIS — G4730 Sleep apnea, unspecified: Secondary | ICD-10-CM | POA: Diagnosis not present

## 2019-05-13 DIAGNOSIS — Z8673 Personal history of transient ischemic attack (TIA), and cerebral infarction without residual deficits: Secondary | ICD-10-CM | POA: Insufficient documentation

## 2019-05-13 DIAGNOSIS — G4700 Insomnia, unspecified: Secondary | ICD-10-CM | POA: Diagnosis not present

## 2019-05-13 DIAGNOSIS — E119 Type 2 diabetes mellitus without complications: Secondary | ICD-10-CM | POA: Diagnosis not present

## 2019-05-13 DIAGNOSIS — Z791 Long term (current) use of non-steroidal anti-inflammatories (NSAID): Secondary | ICD-10-CM | POA: Insufficient documentation

## 2019-05-13 DIAGNOSIS — G40209 Localization-related (focal) (partial) symptomatic epilepsy and epileptic syndromes with complex partial seizures, not intractable, without status epilepticus: Secondary | ICD-10-CM | POA: Insufficient documentation

## 2019-05-13 DIAGNOSIS — K589 Irritable bowel syndrome without diarrhea: Secondary | ICD-10-CM | POA: Diagnosis not present

## 2019-05-13 DIAGNOSIS — F3189 Other bipolar disorder: Secondary | ICD-10-CM | POA: Diagnosis not present

## 2019-05-13 DIAGNOSIS — F4322 Adjustment disorder with anxiety: Secondary | ICD-10-CM

## 2019-05-13 DIAGNOSIS — Z885 Allergy status to narcotic agent status: Secondary | ICD-10-CM | POA: Insufficient documentation

## 2019-05-13 DIAGNOSIS — Z833 Family history of diabetes mellitus: Secondary | ICD-10-CM | POA: Diagnosis not present

## 2019-05-13 DIAGNOSIS — Z880 Allergy status to penicillin: Secondary | ICD-10-CM | POA: Diagnosis not present

## 2019-05-13 DIAGNOSIS — F329 Major depressive disorder, single episode, unspecified: Secondary | ICD-10-CM | POA: Diagnosis not present

## 2019-05-13 LAB — CBC WITH DIFFERENTIAL/PLATELET
Abs Immature Granulocytes: 0.01 10*3/uL (ref 0.00–0.07)
Basophils Absolute: 0 10*3/uL (ref 0.0–0.1)
Basophils Relative: 0 %
Eosinophils Absolute: 0.1 10*3/uL (ref 0.0–0.5)
Eosinophils Relative: 2 %
HCT: 33.3 % — ABNORMAL LOW (ref 36.0–46.0)
Hemoglobin: 10.5 g/dL — ABNORMAL LOW (ref 12.0–15.0)
Immature Granulocytes: 0 %
Lymphocytes Relative: 35 %
Lymphs Abs: 2.6 10*3/uL (ref 0.7–4.0)
MCH: 29.9 pg (ref 26.0–34.0)
MCHC: 31.5 g/dL (ref 30.0–36.0)
MCV: 94.9 fL (ref 80.0–100.0)
Monocytes Absolute: 0.5 10*3/uL (ref 0.1–1.0)
Monocytes Relative: 7 %
Neutro Abs: 4.1 10*3/uL (ref 1.7–7.7)
Neutrophils Relative %: 56 %
Platelets: 232 10*3/uL (ref 150–400)
RBC: 3.51 MIL/uL — ABNORMAL LOW (ref 3.87–5.11)
RDW: 14.8 % (ref 11.5–15.5)
WBC: 7.3 10*3/uL (ref 4.0–10.5)
nRBC: 0 % (ref 0.0–0.2)

## 2019-05-13 LAB — COMPREHENSIVE METABOLIC PANEL
ALT: 13 U/L (ref 0–44)
AST: 16 U/L (ref 15–41)
Albumin: 3.5 g/dL (ref 3.5–5.0)
Alkaline Phosphatase: 76 U/L (ref 38–126)
Anion gap: 10 (ref 5–15)
BUN: 19 mg/dL (ref 6–20)
CO2: 24 mmol/L (ref 22–32)
Calcium: 8.6 mg/dL — ABNORMAL LOW (ref 8.9–10.3)
Chloride: 105 mmol/L (ref 98–111)
Creatinine, Ser: 1.24 mg/dL — ABNORMAL HIGH (ref 0.44–1.00)
GFR calc Af Amer: 59 mL/min — ABNORMAL LOW (ref >60)
GFR calc non Af Amer: 51 mL/min — ABNORMAL LOW (ref >60)
Glucose, Bld: 98 mg/dL (ref 70–99)
Potassium: 3.4 mmol/L — ABNORMAL LOW (ref 3.5–5.1)
Sodium: 139 mmol/L (ref 135–145)
Total Bilirubin: 0.1 mg/dL — ABNORMAL LOW (ref 0.3–1.2)
Total Protein: 7.6 g/dL (ref 6.5–8.1)

## 2019-05-13 LAB — GLUCOSE, CAPILLARY: Glucose-Capillary: 109 mg/dL — ABNORMAL HIGH (ref 70–99)

## 2019-05-13 LAB — ETHANOL: Alcohol, Ethyl (B): <10 mg/dL (ref <10)

## 2019-05-13 LAB — HCG, QUANTITATIVE, PREGNANCY: hCG, Beta Chain, Quant, S: <1 m[IU]/mL (ref <5)

## 2019-05-13 LAB — SARS CORONAVIRUS 2 BY RT PCR (HOSPITAL ORDER, PERFORMED IN ~~LOC~~ HOSPITAL LAB): SARS Coronavirus 2: NEGATIVE

## 2019-05-13 LAB — CBG MONITORING, ED: Glucose-Capillary: 99 mg/dL (ref 70–99)

## 2019-05-13 MED ORDER — TORSEMIDE 10 MG PO TABS
10.0000 mg | ORAL_TABLET | Freq: Every day | ORAL | Status: DC
  Administered 2019-05-13: 10 mg via ORAL
  Filled 2019-05-13 (×3): qty 1

## 2019-05-13 MED ORDER — CARVEDILOL 3.125 MG PO TABS
3.1250 mg | ORAL_TABLET | Freq: Every day | ORAL | Status: DC
  Administered 2019-05-13: 3.125 mg via ORAL
  Filled 2019-05-13: qty 1

## 2019-05-13 MED ORDER — DULOXETINE HCL 30 MG PO CPEP
30.0000 mg | ORAL_CAPSULE | Freq: Every day | ORAL | Status: DC
  Administered 2019-05-13: 30 mg via ORAL
  Filled 2019-05-13: qty 1

## 2019-05-13 MED ORDER — ATORVASTATIN CALCIUM 10 MG PO TABS: 10.0000 mg | ORAL_TABLET | Freq: Every day | ORAL | Status: DC

## 2019-05-13 MED ORDER — NYSTATIN 100000 UNIT/ML MT SUSP
500000.0000 [IU] | Freq: Four times a day (QID) | OROMUCOSAL | Status: DC
  Administered 2019-05-13: 500000 [IU] via ORAL
  Filled 2019-05-13 (×9): qty 5

## 2019-05-13 MED ORDER — PREDNISONE 20 MG PO TABS
20.0000 mg | ORAL_TABLET | Freq: Every day | ORAL | Status: AC
  Administered 2019-05-13: 20 mg via ORAL
  Filled 2019-05-13: qty 1

## 2019-05-13 MED ORDER — ACETAMINOPHEN 325 MG PO TABS: 650.0000 mg | ORAL_TABLET | Freq: Four times a day (QID) | ORAL | Status: DC | PRN

## 2019-05-13 MED ORDER — NAPROXEN 250 MG PO TABS
375.0000 mg | ORAL_TABLET | Freq: Two times a day (BID) | ORAL | Status: DC
  Administered 2019-05-13: 375 mg via ORAL
  Filled 2019-05-13: qty 2

## 2019-05-13 MED ORDER — MAGNESIUM HYDROXIDE 400 MG/5ML PO SUSP: 30.0000 mL | Freq: Every day | ORAL | Status: DC | PRN

## 2019-05-13 MED ORDER — ALUM & MAG HYDROXIDE-SIMETH 200-200-20 MG/5ML PO SUSP: 30.0000 mL | ORAL | Status: DC | PRN

## 2019-05-13 MED ORDER — HYDROXYZINE HCL 25 MG PO TABS: 25.0000 mg | ORAL_TABLET | Freq: Three times a day (TID) | ORAL | Status: DC | PRN

## 2019-05-13 MED ORDER — LINAGLIPTIN 5 MG PO TABS
5.0000 mg | ORAL_TABLET | Freq: Every day | ORAL | Status: DC
  Filled 2019-05-13 (×3): qty 1

## 2019-05-13 MED ORDER — LACOSAMIDE 50 MG PO TABS
200.0000 mg | ORAL_TABLET | Freq: Two times a day (BID) | ORAL | Status: DC
  Administered 2019-05-13: 200 mg via ORAL
  Filled 2019-05-13: qty 4

## 2019-05-13 NOTE — Progress Notes (Signed)
Discharge Note: D: Patient verbalizes readiness for discharge. Denies suicidal and homicidal ideations. Denies auditory and visual hallucinations.  No complaints of pain.  A:  Patient and significant other receptive to discharge instructions. Questions encouraged, both verbalize understanding.  R:  Escorted to the lobby by this RN.
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
## 2019-05-13 NOTE — [facility]HH Suicide Risk Assessment (Signed)
[facility] Discharge Suicide Risk Assessment   Principal Problem: Adjustment disorder with anxiety Discharge Diagnoses: Principal Problem:   Adjustment disorder with anxiety Active Problems:   Bipolar disorder ([place])   Bipolar 1 disorder ([place])   Total Time spent with patient: 30 minutes  Musculoskeletal: Strength & Muscle Tone: within normal limits Gait & Station: UTA since patient is sitting in bed. Patient leans: N/A  Psychiatric Specialty Exam: Review of Systems  Psychiatric/Behavioral: Positive for depression and memory loss. Negative for hallucinations and suicidal ideas.  All other systems reviewed and are negative.   Blood pressure 116/71, pulse (!) 59, temperature 98.5 F (36.9 C), temperature source Oral, resp. rate 16, height 5\' 1"  (1.549 m), weight 122.5 kg, SpO2 100 %.Body mass index is 51.04 kg/m.  General Appearance: Fairly Groomed, middle aged, [name], wearing paper scrubs who is sitting in bed. NAD.   Eye Contact::  Good  Speech:  Clear and Coherent and Normal Rate  Volume:  Normal  Mood:  Euthymic  Affect:  Appropriate and Congruent  Thought Process:  Goal Directed, Linear and Descriptions of Associations: Intact  Orientation:  Full (Time, Place, and Person)  Thought Content:  Logical  Suicidal Thoughts:  No  Homicidal Thoughts:  No  Memory:  Immediate;   Fair Recent;   Fair Remote;   Fair  Judgement:  Fair  Insight:  Fair  Psychomotor Activity:  Normal  Concentration:  Fair  Recall:  [employer]  Language: Fair  Akathisia:  No  Handed:  Right  AIMS (if indicated):   N/A  Assets:  Communication Skills Desire for Improvement Housing Resilience Social Support  Sleep:   N/A  Cognition: Impaired with short term memory deficits.   ADL's:  Intact   Mental Status Per Nursing Assessment::   On Admission:  "Brandi Gomez is a 48 year old female being admitted voluntarily to [facility] OBS unit room 205.  She came to the ED for feeling  irritable, not sleeping well, delusions about someone being under the house and believes that the Cymbalta that was prescribed a few months ago is making her symptoms worse.  During [facility] OBS admission, she was pleasant and cooperative.  She denied SI/HI or A/V hallucinations but did state that she believed that people were out to get her the other night.  She reported history of seizures, diabetes, stroke, hypertension and sleep apnea.  She denied any pain or discomfort and appeared to be in no physical distress.  Oriented her to the unit.  BH-OBS paperwork completed and signed.  Belongings secured in tamper resistant bag and placed in locker # 49.  No contraband found.  Skin assessment completed and no skin issues noted.  Q 15 minute checks initiated for safety."  Demographic Factors:  Unemployed  Loss Factors: NA  Historical Factors: NA  Risk Reduction Factors:   Living with another person, especially a relative and Positive social support  Continued Clinical Symptoms:  More than one psychiatric diagnosis Previous Psychiatric Diagnoses and Treatments Medical Diagnoses and Treatments/Surgeries  Cognitive Features That Contribute To Risk:  None    Suicide Risk:  Minimal: No identifiable suicidal ideation.  Patients presenting with no risk factors but with morbid ruminations; may be classified as minimal risk based on the severity of the depressive symptoms    Plan Of Care/Follow-up recommendations:  -Continue psychotropic medications as prescribed. -Follow up with outpatient provider for medication management.   Faythe Dingwall, DO 05/13/2019, 1:51 PM

## 2019-05-13 NOTE — Plan of Care (Signed)
[place] Observation Crisis Plan  Reason for Crisis Plan:  Crisis Stabilization   Plan of Care:  Referral for Telepsychiatry/Psychiatric Consult  Family Support:      Current Living Environment:  Living Arrangements: Children, Spouse/significant other  Insurance:   Hospital Account    Name Acct ID Class Status Primary Coverage   Brandi Gomez, Brandi Gomez [id number] [place] HMO        Guarantor Account (for Hospital Account [id number])    Name Relation to Pt Service Area Active? Acct Type   [name] Self [facility] Yes Behavioral Health   Address Phone       [address] [place], [place] [address] [phone])          Coverage Information (for Hospital Account [id number])    1. [facility] MEDICARE HMO    F/O Payor/Plan Precert #   [facility] [place] MEDICARE HMO    Subscriber Subscriber #   [place], [place] [id number]   Address Phone   [address] [place] [address] [phone]       2. [place]    F/O Payor/Plan Precert #   [facility] MEDICAID/[place]    Subscriber Subscriber #   [place], [place] [id number]   Address Phone   [address] [place], [place] [address] [phone]          Legal Guardian:     Primary Care Provider:  Horald Pollen, MD  Current Outpatient Providers:  Dr. Amalia Greenhouse  Psychiatrist:  Name of Psychiatrist: Dr. Amalia Greenhouse  Counselor/Therapist:  Name of Therapist: None  Compliant with Medications:  Yes  Additional Information:   Windell Moment 6/4/20203:35 AM

## 2019-05-13 NOTE — Discharge Instructions (Signed)
For your behavioral health needs, you are advised to follow up with your primary care provider at your earliest opportunity.

## 2019-05-13 NOTE — BH Assessment (Signed)
[facility] Assessment Progress Note  Per Buford Dresser, DO, this pt does not require psychiatric hospitalization at this time.  Pt is to be discharged from [facility] with recommendation to follow up with her primary care provider.  This has been included in pt's discharge instructions.  Pt's nurse, Freda Munro, has been notified.  Jalene Mullet, [place] Triage Specialist [phone]

## 2019-05-13 NOTE — ED Notes (Signed)
EKG given to EDP,Cardama,MD., for review. 

## 2019-05-13 NOTE — Progress Notes (Signed)
[place] NOVEL CORONAVIRUS (COVID-19) DAILY CHECK-OFF SYMPTOMS - answer yes or no to each - every day NO YES  Have you had a fever in the past 24 hours?  . Fever (Temp > 37.80C / 100F) X   Have you had any of these symptoms in the past 24 hours? . New Cough .  Sore Throat  .  Shortness of Breath .  Difficulty Breathing .  Unexplained Body Aches   X   Have you had any one of these symptoms in the past 24 hours not related to allergies?   . Runny Nose .  Nasal Congestion .  Sneezing   X   If you have had runny nose, nasal congestion, sneezing in the past 24 hours, has it worsened?  X   EXPOSURES - check yes or no X   Have you traveled outside the state in the past 14 days?  X   Have you been in contact with someone with a confirmed diagnosis of COVID-19 or PUI in the past 14 days without wearing appropriate PPE?  X   Have you been living in the same home as a person with confirmed diagnosis of COVID-19 or a PUI (household contact)?    X   Have you been diagnosed with COVID-19?    X              What to do next: Answered NO to all: Answered YES to anything:   Proceed with unit schedule Follow the [facility] Inpatient Flowsheet.   

## 2019-05-13 NOTE — ED Notes (Signed)
Pt. CBG 99, RN,Jeneen made aware.

## 2019-05-13 NOTE — Progress Notes (Signed)
Brandi Gomez is a 48 year old female being admitted voluntarily to [facility] OBS unit room 205.  She came to the ED for feeling irritable, not sleeping well, delusions about someone being under the house and believes that the Cymbalta that was prescribed a few months ago is making her symptoms worse.  During [facility] OBS admission, she was pleasant and cooperative.  She denied SI/HI or A/V hallucinations but did state that she believed that people were out to get her the other night.  She reported history of seizures, diabetes, stroke, hypertension and sleep apnea.  She denied any pain or discomfort and appeared to be in no physical distress.  Oriented her to the unit.  BH-OBS paperwork completed and signed.  Belongings secured in tamper resistant bag and placed in locker # 49.  No contraband found.  Skin assessment completed and no skin issues noted.  Q 15 minute checks initiated for safety.

## 2019-05-13 NOTE — H&P (Signed)
[facility] Provider Admission PAA/H&P  Patient Identification: DARIANA GARBETT MRN:  [id number] Date of Evaluation:  05/13/2019 Chief Complaint:  Pending Principal Diagnosis: Bipolar disorder ([place]) Diagnosis:  Principal Problem:   Bipolar disorder ([place])  History of Present Illness:    Brandi Gomez is an 48 y.o. female.  Patient started at [facility] for med clearance.  Pt was brought over to [facility] from home by her significant other.  She lives at home with daughter's father and daughter.  Pt is single and lists child's father as a family support. Patient says that she feels that her cymbalta is causing an "allergic reaction."  She has been on this for awhile.  Patient says she feels irritable and feels like the floor is vibrating at times.  She says "I have been wanting to go out at night and walk."  She describes being restless and having poor sleep over the last two days. Patient says that she has no SI, HI or A/V hallucinations.  It was documented that patient had said someone may be under the house.  Lindon Romp, FNP asked her about this and patient clarified that she felt like the floor was shaking such that it made her think someone might be under the house but she knows that would not be the case. Patient reports some depression.  She is able to tell some pas experiences but says her recent memory and concentration have been poor lately.  Patient's home health nurse called her primary care physician, Dr. Merri Ray.  He recommended contacting Dr. Casimiro Needle  But if the symptoms were acute to bring her to the ED.  Her significant other brought her to the ED.  Family reports that ptient has had worsening hallucinations and delusional thinking over the lst few weeks.  Patient has been going to Dr. Casimiro Needle for the last 6 months.  Seh has been to [facility] "many years ago."    On evaluation patient is alert and oriented x 4, pleasant, and cooperative. Speech is clear and coherent. Mood is  depressed and affect is congruent with mood. Thought process is coherent and thought content is linear. Denies suicidal thoughts. When asked about thoughts of dying she states "A few days ago I felt like the blood was draining from my legs and the feeling moved up to my chest. I thought it was my time to die, so I wrote my family a letter. After I laid down for a while I started feeling better. I tore up the letter because I didn't want my family to see it." Denies homicidal ideations. When asked about reports that she thought someone was under her house she stated "I felt like the floor was vibrating so I thought someone might be under the house. I didn't hear anyone talking or anything, I just wanted to find out why the floor was vibrating. No indication that she is responding to internal stimuli.   States that she does not know the names of her medications because her daughter and someone from home health assist her with her medications. States "I have a memory problem so its is safer to have someone to help me with my medications."   Associated Signs/Symptoms: Depression Symptoms:  depressed mood, insomnia, anxiety, loss of energy/fatigue, (Hypo) Manic Symptoms:  Irritable Mood, Anxiety Symptoms:  general anxiety Psychotic Symptoms:  Possible delusions. reports feeling like the floor in her house is vibrating. PTSD Symptoms: Negative Total Time spent with patient: 30 minutes   Past  Psychiatric History: Bipolar Disorder. Patient has been going to Dr. Casimiro Needle for the last 6 months.    Is the patient at risk to self? No.  Has the patient been a risk to self in the past 6 months? No.  Has the patient been a risk to self within the distant past? No.  Is the patient a risk to others? No.  Has the patient been a risk to others in the past 6 months? No.  Has the patient been a risk to others within the distant past? No.   Prior Inpatient Therapy: Prior Inpatient Therapy: Yes Prior Therapy  Dates: Years ago Prior Therapy Facilty/Provider(s): [facility] Reason for Treatment: depression Prior Outpatient Therapy: Prior Outpatient Therapy: Yes Prior Therapy Dates: Last 6 months Prior Therapy Facilty/Provider(s): Dr. Amalia Greenhouse Reason for Treatment: med management Does patient have an ACCT team?: No Does patient have Intensive In-House Services?  : No Does patient have [facility] services? : No Does patient have P4CC services?: No  Alcohol Screening:   Substance Abuse History in the last 12 months:  No. Consequences of Substance Abuse: NA Previous Psychotropic Medications: Yes  Psychological Evaluations: Yes  Past Medical History:  Past Medical History:  Diagnosis Date  . Anemia   . Anxiety   . Bipolar 1 disorder ([place])   . Common migraine 05/19/2015  . Depression   . Diabetes mellitus, type II ([place])   . Hypertension   . Irritable bowel syndrome (IBS)   . Mild mental retardation   . Obesity   . Partial complex seizure disorder with intractable epilepsy ([place]) 05/12/2014  . Seizures ([place])    intractable, sz 08/23/17  . Sleep apnea   . Stroke ([place])   . Type II or unspecified type diabetes mellitus without mention of complication, not stated as uncontrolled     Past Surgical History:  Procedure Laterality Date  . COLONOSCOPY     2012-normal , Dr Sharlett Iles  . ESOPHAGOGASTRODUODENOSCOPY     normal-Dr Patterson 2012  . LOOP RECORDER INSERTION N/A 05/30/2017   Procedure: Loop Recorder Insertion;  Surgeon: Constance Haw, MD;  Location: [place] [facility];  Service: Cardiovascular;  Laterality: N/A;  . LOOP RECORDER REMOVAL N/A 03/04/2018   Procedure: LOOP RECORDER REMOVAL;  Surgeon: Constance Haw, MD;  Location: [place] [facility];  Service: Cardiovascular;  Laterality: N/A;  . MYRINGOTOMY WITH TUBE PLACEMENT    . MYRINGOTOMY WITH TUBE PLACEMENT Right 11/05/2017   Procedure: MYRINGOTOMY WITH TUBE PLACEMENT;  Surgeon: Melissa Montane, MD;  Location: [place];  Service: ENT;   Laterality: Right;  right T Tube placement  . NASAL SINUS SURGERY    . TEE WITHOUT CARDIOVERSION N/A 05/30/2017   Procedure: TRANSESOPHAGEAL ECHOCARDIOGRAM (TEE);  Surgeon: Acie Fredrickson Wonda Cheng, MD;  Location: [facility];  Service: Cardiovascular;  Laterality: N/A;   Family History:  Family History  Problem Relation Age of Onset  . Diabetes Mother        passed away from accidental death  . Hypertension Mother   . Bipolar disorder Father   . Diabetes Daughter   . Leukemia Daughter   . Ovarian cancer Maternal Aunt    Family Psychiatric History: No pertinent hisotry Tobacco Screening:   Social History:  Social History   Substance and Sexual Activity  Alcohol Use No     Social History   Substance and Sexual Activity  Drug Use No    Additional Social History: Marital status: Single    Pain Medications: See PTA medication  list Prescriptions: See PTA medication list Over the Counter: See PTA medication list History of alcohol / drug use?: No history of alcohol / drug abuse                    Allergies:   Allergies  Allergen Reactions  . Amoxicillin Itching    Has patient had a PCN reaction causing immediate rash, facial/tongue/throat swelling, SOB or lightheadedness with hypotension: yes Has patient had a PCN reaction causing severe rash involving mucus membranes or skin necrosis: no Has patient had a PCN reaction that required hospitalization: no Has patient had a PCN reaction occurring within the last 10 years: yes If all of the above answers are "NO", then may proceed with Cephalosporin use.  Marland Kitchen Lisinopril Swelling    Angioedema   . Hydrocodone Other (See Comments)    Depressed   . Tegretol [Carbamazepine] Swelling    Throat swells   Lab Results:  Results for orders placed or performed during the hospital encounter of 05/12/19 (from the past 48 hour(s))  POC CBG, ED     Status: None   Collection Time: 05/13/19 12:20 AM  Result Value Ref Range    Glucose-Capillary 99 70 - 99 mg/dL   Comment 1 Notify RN   Comprehensive metabolic panel     Status: Abnormal   Collection Time: 05/13/19 12:53 AM  Result Value Ref Range   Sodium 139 135 - 145 mmol/L   Potassium 3.4 (L) 3.5 - 5.1 mmol/L   Chloride 105 98 - 111 mmol/L   CO2 24 22 - 32 mmol/L   Glucose, Bld 98 70 - 99 mg/dL   BUN 19 6 - 20 mg/dL   Creatinine, Ser 1.24 (H) 0.44 - 1.00 mg/dL   Calcium 8.6 (L) 8.9 - 10.3 mg/dL   Total Protein 7.6 6.5 - 8.1 g/dL   Albumin 3.5 3.5 - 5.0 g/dL   AST 16 15 - 41 U/L   ALT 13 0 - 44 U/L   Alkaline Phosphatase 76 38 - 126 U/L   Total Bilirubin 0.1 (L) 0.3 - 1.2 mg/dL   GFR calc non Af Amer 51 (L) >60 mL/min   GFR calc Af Amer 59 (L) >60 mL/min   Anion gap 10 5 - 15    Comment: Performed at [facility], [place] [address] [place], [place] [address]  Ethanol     Status: None   Collection Time: 05/13/19 12:53 AM  Result Value Ref Range   Alcohol, Ethyl (B) <10 <10 mg/dL    Comment: (NOTE) Lowest detectable limit for serum alcohol is 10 mg/dL. For medical purposes only. Performed at [facility], [place] [address] [place], [place] [address]   CBC with Diff     Status: Abnormal   Collection Time: 05/13/19 12:53 AM  Result Value Ref Range   WBC 7.3 4.0 - 10.5 K/uL   RBC 3.51 (L) 3.87 - 5.11 MIL/uL   Hemoglobin 10.5 (L) 12.0 - 15.0 g/dL   HCT 33.3 (L) 36.0 - 46.0 %   MCV 94.9 80.0 - 100.0 fL   MCH 29.9 26.0 - 34.0 pg   MCHC 31.5 30.0 - 36.0 g/dL   RDW 14.8 11.5 - 15.5 %   Platelets 232 150 - 400 K/uL   nRBC 0.0 0.0 - 0.2 %   Neutrophils Relative % 56 %   Neutro Abs 4.1 1.7 - 7.7 K/uL   Lymphocytes Relative 35 %   Lymphs Abs 2.6  0.7 - 4.0 K/uL   Monocytes Relative 7 %   Monocytes Absolute 0.5 0.1 - 1.0 K/uL   Eosinophils Relative 2 %   Eosinophils Absolute 0.1 0.0 - 0.5 K/uL   Basophils Relative 0 %   Basophils Absolute 0.0 0.0 - 0.1 K/uL   Immature Granulocytes 0 %   Abs Immature Granulocytes  0.01 0.00 - 0.07 K/uL    Comment: Performed at [facility], [place] [address], [place], [place] [address]  SARS Coronavirus 2 (CEPHEID - Performed in [place] hospital lab), Hosp Order     Status: None   Collection Time: 05/13/19 12:53 AM  Result Value Ref Range   SARS Coronavirus 2 NEGATIVE NEGATIVE    Comment: (NOTE) If result is NEGATIVE SARS-CoV-2 target nucleic acids are NOT DETECTED. The SARS-CoV-2 RNA is generally detectable in upper and lower  respiratory specimens during the acute phase of infection. The lowest  concentration of SARS-CoV-2 viral copies this assay can detect is 250  copies / mL. A negative result does not preclude SARS-CoV-2 infection  and should not be used as the sole basis for treatment or other  patient management decisions.  A negative result may occur with  improper specimen collection / handling, submission of specimen other  than nasopharyngeal swab, presence of viral mutation(s) within the  areas targeted by this assay, and inadequate number of viral copies  (<250 copies / mL). A negative result must be combined with clinical  observations, patient history, and epidemiological information. If result is POSITIVE SARS-CoV-2 target nucleic acids are DETECTED. The SARS-CoV-2 RNA is generally detectable in upper and lower  respiratory specimens dur ing the acute phase of infection.  Positive  results are indicative of active infection with SARS-CoV-2.  Clinical  correlation with patient history and other diagnostic information is  necessary to determine patient infection status.  Positive results do  not rule out bacterial infection or co-infection with other viruses. If result is PRESUMPTIVE POSTIVE SARS-CoV-2 nucleic acids MAY BE PRESENT.   A presumptive positive result was obtained on the submitted specimen  and confirmed on repeat testing.  While 2019 novel coronavirus  (SARS-CoV-2) nucleic acids may be present in the submitted  sample  additional confirmatory testing may be necessary for epidemiological  and / or clinical management purposes  to differentiate between  SARS-CoV-2 and other Sarbecovirus currently known to infect humans.  If clinically indicated additional testing with an alternate test  methodology [phone]) is advised. The SARS-CoV-2 RNA is generally  detectable in upper and lower respiratory sp ecimens during the acute  phase of infection. The expected result is Negative. Fact Sheet for Patients:  StrictlyIdeas.no Fact Sheet for Healthcare Providers: BankingDealers.co.za This test is not yet approved or cleared by the [place] FDA and has been authorized for detection and/or diagnosis of SARS-CoV-2 by FDA under an Emergency Use Authorization (EUA).  This EUA will remain in effect (meaning this test can be used) for the duration of the COVID-19 declaration under Section 564(b)(1) of the Act, 21 U.S.C. section 360bbb-3(b)(1), unless the authorization is terminated or revoked sooner. Performed at [facility], [place] [address], [place], [place] [address]   hCG, quantitative, pregnancy     Status: None   Collection Time: 05/13/19  1:08 AM  Result Value Ref Range   hCG, Beta Chain, Quant, S <1 <5 mIU/mL    Comment:          GEST. AGE  CONC.  (mIU/mL)   <=1 WEEK        5 - 50     2 WEEKS       50 - 500     3 WEEKS       100 - 10,000     4 WEEKS     1,000 - 30,000     5 WEEKS     3,500 - 115,000   6-8 WEEKS     12,000 - 270,000    12 WEEKS     15,000 - 220,000        FEMALE AND NON-PREGNANT FEMALE:     LESS THAN 5 mIU/mL Performed at [facility], [place] [address], [place], [place] [address]     Blood Alcohol level:  Lab Results  Component Value Date   [facility] <10 05/13/2019   ETH <10 72/62/0355    Metabolic Disorder Labs:  Lab Results  Component Value Date   HGBA1C 5.8 (A) 11/11/2018   MPG 163  11/05/2017   MPG 166 05/21/2017   No results found for: PROLACTIN Lab Results  Component Value Date   CHOL 158 05/21/2017   TRIG 161 (H) 05/21/2017   HDL 44 05/21/2017   CHOLHDL 3.6 05/21/2017   VLDL 32 05/21/2017   LDLCALC 82 05/21/2017    Current Medications: No current facility-administered medications for this encounter.    PTA Medications: Medications Prior to Admission  Medication Sig Dispense Refill Last Dose  . atorvastatin (LIPITOR) 10 MG tablet Take 1 tablet (10 mg total) by mouth daily at 6 PM. 90 tablet 3 05/12/2019 at Unknown time  . Blood Glucose Monitoring Suppl KIT Please supply kit covered by insurance and supplies for testing four times daily. E11.9 E10.9 1 each 0   . Blood Pressure Monitor KIT Take blood pressure reading twice a day for the next 2 weeks and then once a day. 1 each 0   . carbamazepine (TEGRETOL) 200 MG tablet Take 1.5 tablets (300 mg total) by mouth 2 (two) times daily at 10 AM and 5 PM. (Patient not taking: Reported on 05/12/2019) 90 tablet 4 Not Taking  . carvedilol (COREG) 3.125 MG tablet Take 1 tablet (3.125 mg total) by mouth daily. 30 tablet 1 05/12/2019 at 1000  . clotrimazole (LOTRIMIN) 1 % cream Apply to affected area 2 times daily (Patient taking differently: Apply 1 application topically 2 (two) times daily as needed. rash) 30 g 0 Past Month at Unknown time  . DULoxetine (CYMBALTA) 30 MG capsule Take 1 capsule (30 mg total) by mouth daily. 30 capsule 6 05/12/2019 at Unknown time  . lacosamide (VIMPAT) 200 MG TABS tablet Take 1 tablet (200 mg total) by mouth 2 (two) times daily. 180 tablet 0 05/12/2019 at 1000  . naproxen (NAPROSYN) 375 MG tablet Take 1 tablet (375 mg total) by mouth 2 (two) times daily. 20 tablet 0 05/12/2019 at Unknown time  . nystatin (MYCOSTATIN) 100000 UNIT/ML suspension Take 5 mLs (500,000 Units total) by mouth 4 (four) times daily. 60 mL 0 05/12/2019 at Unknown time  . predniSONE (DELTASONE) 20 MG tablet Take 1 tablet (20 mg total)  by mouth daily. 3 tablet 0 05/12/2019 at Unknown time  . sitaGLIPtin (JANUVIA) 50 MG tablet Take 1 tablet (50 mg total) by mouth daily. Schedule appointment for additional refills. 90 tablet 0 05/12/2019 at Unknown time  . torsemide (DEMADEX) 10 MG tablet TAKE 1 TABLET BY MOUTH EVERY DAY (Patient taking differently: Take 10 mg  by mouth daily. ) 30 tablet 0 05/12/2019 at Unknown time    Musculoskeletal: Strength & Muscle Tone: within normal limits Gait & Station: normal Patient leans: Front  Psychiatric Specialty Exam: Physical Exam  Constitutional: She is oriented to person, place, and time. She appears well-developed and well-nourished. No distress.  HENT:  Head: Normocephalic and atraumatic.  Right Ear: External ear normal.  Left Ear: External ear normal.  Eyes: Pupils are equal, round, and reactive to light. Conjunctivae are normal. Right eye exhibits no discharge. Left eye exhibits no discharge. No scleral icterus.  Respiratory: Effort normal. No respiratory distress.  Musculoskeletal: Normal range of motion.  Neurological: She is alert and oriented to person, place, and time.  Skin: She is not diaphoretic.  Psychiatric: She is not withdrawn and not actively hallucinating. Thought content is not paranoid and not delusional. She exhibits a depressed mood. She expresses no homicidal and no suicidal ideation.    Review of Systems  Constitutional: Negative for chills, diaphoresis, fever, malaise/fatigue and weight loss.  Respiratory: Negative for cough and shortness of breath.   Cardiovascular: Negative for chest pain.  Gastrointestinal: Negative for diarrhea, nausea and vomiting.  Psychiatric/Behavioral: Positive for depression. Negative for hallucinations, memory loss, substance abuse and suicidal ideas. The patient is nervous/anxious and has insomnia.     Blood pressure 127/82, pulse 71, temperature 98.7 F (37.1 C), temperature source Oral, resp. rate 18, height '5\' 1"'$  (1.549 m), weight  122.5 kg, SpO2 100 %.Body mass index is 51.04 kg/m.  General Appearance: Casual and Well Groomed  Eye Contact:  Good  Speech:  Clear and Coherent and Normal Rate  Volume:  Normal  Mood:  Depressed  Affect:  Congruent and Depressed  Thought Process:  Coherent and Descriptions of Associations: Intact  Orientation:  Full (Time, Place, and Person)  Thought Content:  Logical  Suicidal Thoughts:  No  Homicidal Thoughts:  No  Memory:  Immediate;   Fair Recent;   Fair  Judgement:  Fair  Insight:  Fair  Psychomotor Activity:  Normal  Concentration:  Concentration: Fair and Attention Span: Fair  Recall:  [employer] of Knowledge:  Fair  Language:  Good  Akathisia:  Negative  Handed:  Right  AIMS (if indicated):     Assets:  Communication Skills Desire for Improvement Financial Resources/Insurance Housing Intimacy Leisure Time Physical Health  ADL's:  Intact  Cognition:  Impaired,  Mild  Sleep:         Treatment Plan Summary: Daily contact with patient to assess and evaluate symptoms and progress in treatment  Observation Level/Precautions:  15 minute checks Laboratory:  See ED labs Psychotherapy:  Indiviudal Medications:  Resumed home medications  Consultations:  As neded Discharge Concerns:   Estimated LOS: Other:      Rozetta Nunnery, NP 6/4/20203:38 AM

## 2019-05-13 NOTE — Discharge Summary (Addendum)
Physician Discharge Summary Note  Patient:  Brandi Gomez is an 48 y.o., female MRN:  [id number] DOB:  1971-11-19 Patient phone:  [phone] (home)  Patient address:   [address] [place] [address],  Total Time spent with patient: 1 hour  Date of Admission:  05/13/2019 Date of Discharge: 05/13/19   Reason for Admission:  Medication side effect  Principal Problem: Adjustment disorder with anxiety Discharge Diagnoses: Principal Problem:   Bipolar disorder ([place]) Active Problems:   Bipolar 1 disorder ([place])   Past Psychiatric History: Bipolar 1 disorder, anxiety and depression.   Past Medical History:  Past Medical History:  Diagnosis Date  . Anemia   . Anxiety   . Bipolar 1 disorder ([place])   . Common migraine 05/19/2015  . Depression   . Diabetes mellitus, type II ([place])   . Hypertension   . Irritable bowel syndrome (IBS)   . Mild mental retardation   . Obesity   . Partial complex seizure disorder with intractable epilepsy ([place]) 05/12/2014  . Seizures ([place])    intractable, sz 08/23/17  . Sleep apnea   . Stroke ([place])   . Type II or unspecified type diabetes mellitus without mention of complication, not stated as uncontrolled     Past Surgical History:  Procedure Laterality Date  . COLONOSCOPY     2012-normal , Dr Sharlett Iles  . ESOPHAGOGASTRODUODENOSCOPY     normal-Dr Patterson 2012  . LOOP RECORDER INSERTION N/A 05/30/2017   Procedure: Loop Recorder Insertion;  Surgeon: Constance Haw, MD;  Location: [place] [facility];  Service: Cardiovascular;  Laterality: N/A;  . LOOP RECORDER REMOVAL N/A 03/04/2018   Procedure: LOOP RECORDER REMOVAL;  Surgeon: Constance Haw, MD;  Location: [place] [facility];  Service: Cardiovascular;  Laterality: N/A;  . MYRINGOTOMY WITH TUBE PLACEMENT    . MYRINGOTOMY WITH TUBE PLACEMENT Right 11/05/2017   Procedure: MYRINGOTOMY WITH TUBE PLACEMENT;  Surgeon: Melissa Montane, MD;  Location: [facility];  Service: ENT;  Laterality: Right;   right T Tube placement  . NASAL SINUS SURGERY    . TEE WITHOUT CARDIOVERSION N/A 05/30/2017   Procedure: TRANSESOPHAGEAL ECHOCARDIOGRAM (TEE);  Surgeon: Acie Fredrickson Wonda Cheng, MD;  Location: [facility] ENDOSCOPY;  Service: Cardiovascular;  Laterality: N/A;   Family History:  Family History  Problem Relation Age of Onset  . Diabetes Mother        passed away from accidental death  . Hypertension Mother   . Bipolar disorder Father   . Diabetes Daughter   . Leukemia Daughter   . Ovarian cancer Maternal Aunt    Family Psychiatric  History: As listed above.  Social History:  Social History   Substance and Sexual Activity  Alcohol Use No     Social History   Substance and Sexual Activity  Drug Use No    Social History   Socioeconomic History  . Marital status: Single    Spouse name: [name]  . Number of children: 3  . Years of education: 67  . Highest education level: Not on file  Occupational History  . Occupation: disabled    Employer: DISABLED  Social Needs  . Financial resource strain: Not on file  . Food insecurity:    Worry: Not on file    Inability: Not on file  . Transportation needs:    Medical: Not on file    Non-medical: Not on file  Tobacco Use  . Smoking status: Never Smoker  . Smokeless tobacco: Never Used  Substance  and Sexual Activity  . Alcohol use: No  . Drug use: No  . Sexual activity: Never  Lifestyle  . Physical activity:    Days per week: Not on file    Minutes per session: Not on file  . Stress: Not on file  Relationships  . Social connections:    Talks on phone: Not on file    Gets together: Not on file    Attends religious service: Not on file    Active member of club or organization: Not on file    Attends meetings of clubs or organizations: Not on file    Relationship status: Not on file  Other Topics Concern  . Not on file  Social History Narrative   Patient lives at home with daughter.    Patient has 3 children.    Patient is right  handed.    Patient has a high school education.    Patient is on disability   Patient drinks 2 cups of caffeine daily.    Hospital Course:   Brandi Gomez presents to the hospital for complaint of side effects secondary to Cymbalta. She reports that she placed her feet on the floor and it felt like it was moving a day ago. She also felt a burning sensation in her legs and it felt like blood was rushing from her feet to her chest. She attributes these somatic symptoms to Cymbalta although it is not a new medication. Per chart review, she was seen at [facility] on 6/2 for possible reaction to Tegretol. It was reportedly recently started by her outpatient psychiatrist. Tegretol was discontinued and she was started on Prednisone for 3 days.   On interview, Brandi Gomez denies further somatic complaints since admission. She reports that she is doing well today. She does report some interpersonal conflict with being herself. She reports a strained relationship with her 73 y/o daughter and states, "I have been going through a lot." She denies SI, HI or AVH. She does not appear to be responding to internal stimuli. She reports recent seizures although she cannot provide the exact date due to impaired memory.   Physical Findings: AIMS: Facial and Oral Movements Muscles of Facial Expression: None, normal Lips and Perioral Area: None, normal Jaw: None, normal Tongue: None, normal,Extremity Movements Upper (arms, wrists, hands, fingers): None, normal Lower (legs, knees, ankles, toes): None, normal, Trunk Movements Neck, shoulders, hips: None, normal, Overall Severity Severity of abnormal movements (highest score from questions above): None, normal Incapacitation due to abnormal movements: None, normal Patient's awareness of abnormal movements (rate only patient's report): No Awareness, Dental Status Current problems with teeth and/or dentures?: No Does patient usually wear dentures?: No  CIWA:    N/A COWS:   N/A  Musculoskeletal: Strength & Muscle Tone: within normal limits Gait & Station: UTA since patient is sitting in bed. Patient leans: N/A  Psychiatric Specialty Exam: Physical Exam  Nursing note and vitals reviewed. Constitutional: She is oriented to person, place, and time. She appears well-developed and well-nourished.  HENT:  Head: Normocephalic and atraumatic.  Neck: Normal range of motion.  Respiratory: Effort normal.  Musculoskeletal: Normal range of motion.  Neurological: She is alert and oriented to person, place, and time.  Psychiatric: She has a normal mood and affect. Her speech is normal and behavior is normal. Judgment and thought content normal. Cognition and memory are impaired.    Review of Systems  Psychiatric/Behavioral: Positive for depression. Negative for hallucinations, substance abuse and suicidal  ideas.  All other systems reviewed and are negative.   Blood pressure 116/71, pulse (!) 59, temperature 98.5 F (36.9 C), temperature source Oral, resp. rate 16, height '5\' 1"'$  (1.549 m), weight 122.5 kg, SpO2 100 %.Body mass index is 51.04 kg/m.  General Appearance: Fairly Groomed, middle aged, [name], wearing paper scrubs who is sitting in bed. NAD.   Eye Contact:  Good  Speech:  Clear and Coherent and Normal Rate  Volume:  Normal  Mood:  Euthymic  Affect:  Appropriate and Congruent  Thought Process:  Goal Directed, Linear and Descriptions of Associations: Intact  Orientation:  Full (Time, Place, and Person)  Thought Content:  Logical  Suicidal Thoughts:  No  Homicidal Thoughts:  No  Memory:  Immediate;   Poor Recent;   Fair Remote;   Fair  Judgement:  Fair  Insight:  Fair  Psychomotor Activity:  Normal  Concentration:  Concentration: Fair and Attention Span: Fair  Recall:  [employer] of Knowledge:  Fair  Language:  Fair  Akathisia:  No  Handed:  Right  AIMS (if indicated):   N/A  Assets:  Communication Skills Desire  for Improvement Housing Resilience Social Support  ADL's:  Intact  Cognition: Impaired with short term memory deficits.   Sleep:   N/A     Have you used any form of tobacco in the last 30 days? (Cigarettes, Smokeless Tobacco, Cigars, and/or Pipes): No  Has this patient used any form of tobacco in the last 30 days? (Cigarettes, Smokeless Tobacco, Cigars, and/or Pipes) No  Blood Alcohol level:  Lab Results  Component Value Date   ETH <10 05/13/2019   ETH <10 26/20/3559    Metabolic Disorder Labs:  Lab Results  Component Value Date   HGBA1C 5.8 (A) 11/11/2018   MPG 163 11/05/2017   MPG 166 05/21/2017   No results found for: PROLACTIN Lab Results  Component Value Date   CHOL 158 05/21/2017   TRIG 161 (H) 05/21/2017   HDL 44 05/21/2017   CHOLHDL 3.6 05/21/2017   VLDL 32 05/21/2017   LDLCALC 82 05/21/2017    See Psychiatric Specialty Exam and Suicide Risk Assessment completed by Attending Physician prior to discharge.  Discharge destination:  Home  Is patient on multiple antipsychotic therapies at discharge:  No   Has Patient had three or more failed trials of antipsychotic monotherapy by history:  No  Recommended Plan for Multiple Antipsychotic Therapies: NA  Discharge Instructions    Diet - low sodium heart healthy   Complete by:  As directed    Increase activity slowly   Complete by:  As directed      Allergies as of 05/13/2019      Reactions   Amoxicillin Itching   Has patient had a PCN reaction causing immediate rash, facial/tongue/throat swelling, SOB or lightheadedness with hypotension: yes Has patient had a PCN reaction causing severe rash involving mucus membranes or skin necrosis: no Has patient had a PCN reaction that required hospitalization: no Has patient had a PCN reaction occurring within the last 10 years: yes If all of the above answers are "NO", then may proceed with Cephalosporin use.   Lisinopril Swelling   Angioedema    Hydrocodone Other  (See Comments)   Depressed    Tegretol [carbamazepine] Swelling   Throat swells      Medication List    STOP taking these medications   carbamazepine 200 MG tablet Commonly known as:  TEGretol  TAKE these medications     Indication  atorvastatin 10 MG tablet Commonly known as:  LIPITOR Take 1 tablet (10 mg total) by mouth daily at 6 PM.  Indication:  hyperlipidemia   Blood Glucose Monitoring Suppl Kit Please supply kit covered by insurance and supplies for testing four times daily. E11.9 E10.9  Indication:  diabetes   Blood Pressure Monitor Kit Take blood pressure reading twice a day for the next 2 weeks and then once a day.  Indication:  hypertension   carvedilol 3.125 MG tablet Commonly known as:  COREG Take 1 tablet (3.125 mg total) by mouth daily.  Indication:  High Blood Pressure Disorder   clotrimazole 1 % cream Commonly known as:  LOTRIMIN Apply to affected area 2 times daily What changed:    how much to take  how to take this  when to take this  reasons to take this  additional instructions  Indication:  candida   DULoxetine 30 MG capsule Commonly known as:  CYMBALTA Take 1 capsule (30 mg total) by mouth daily.  Indication:  depression   lacosamide 200 MG Tabs tablet Commonly known as:  Vimpat Take 1 tablet (200 mg total) by mouth 2 (two) times daily.  Indication:  seizures   naproxen 375 MG tablet Commonly known as:  NAPROSYN Take 1 tablet (375 mg total) by mouth 2 (two) times daily.  Indication:  Pain   nystatin 100000 UNIT/ML suspension Commonly known as:  MYCOSTATIN Take 5 mLs (500,000 Units total) by mouth 4 (four) times daily.  Indication:  candida   predniSONE 20 MG tablet Commonly known as:  DELTASONE Take 1 tablet (20 mg total) by mouth daily.  Indication:  oral thrush   sitaGLIPtin 50 MG tablet Commonly known as:  Januvia Take 1 tablet (50 mg total) by mouth daily. Schedule appointment for additional refills.   Indication:  Type 2 Diabetes   torsemide 10 MG tablet Commonly known as:  DEMADEX TAKE 1 TABLET BY MOUTH EVERY DAY  Indication:  High Blood Pressure Disorder      Assessment:  Brandi Gomez is a 48 y.o. female who was admitted with reported allergic reaction to Cymbalta. Tegretol was recently discontinued at the [facility] and Prednisone was started. It is possible that that her somatic complaints are related to recent medication changes. She denies current complaints. She denies SI, HI or AVH. She does not appear to be responding to internal stimuli. She does not warrant inpatient psychiatric hospitalization at this time and will follow up with her outpatient providers for medical and psychiatric care.   Follow-up recommendations:   -Continue psychotropic medications as prescribed. -Follow up with outpatient provider for medication management.   Comments:  N/A  Signed: Faythe Dingwall, DO 05/13/2019, 11:44 AM

## 2019-05-13 NOTE — H&P (Signed)
Behavioral Health Medical Screening Exam  Brandi Gomez is an 48 y.o. female.   Psychiatric Specialty Exam: Physical Exam  Constitutional: She is oriented to person, place, and time. She appears well-developed and well-nourished. No distress.  HENT:  Head: Normocephalic and atraumatic.  Right Ear: External ear normal.  Left Ear: External ear normal.  Eyes: Pupils are equal, round, and reactive to light. Conjunctivae are normal. Right eye exhibits no discharge. Left eye exhibits no discharge. No scleral icterus.  Respiratory: Effort normal. No respiratory distress.  Musculoskeletal: Normal range of motion.  Neurological: She is alert and oriented to person, place, and time.  Skin: She is not diaphoretic.  Psychiatric: She is not withdrawn and not actively hallucinating. Thought content is not paranoid and not delusional. She exhibits a depressed mood. She expresses no homicidal and no suicidal ideation.    Review of Systems  Constitutional: Negative for chills, diaphoresis, fever, malaise/fatigue and weight loss.  Respiratory: Negative for cough and shortness of breath.   Cardiovascular: Negative for chest pain.  Gastrointestinal: Negative for diarrhea, nausea and vomiting.  Psychiatric/Behavioral: Positive for depression. Negative for hallucinations, memory loss, substance abuse and suicidal ideas. The patient is nervous/anxious and has insomnia.     Blood pressure 127/82, pulse 71, temperature 98.7 F (37.1 C), temperature source Oral, resp. rate 18, height 5\' 1"  (1.549 m), weight 122.5 kg, SpO2 100 %.Body mass index is 51.04 kg/m.  General Appearance: Casual and Well Groomed  Eye Contact:  Good  Speech:  Clear and Coherent and Normal Rate  Volume:  Normal  Mood:  Depressed  Affect:  Congruent and Depressed  Thought Process:  Coherent and Descriptions of Associations: Intact  Orientation:  Full (Time, Place, and Person)  Thought Content:  Logical  Suicidal Thoughts:  No   Homicidal Thoughts:  No  Memory:  Immediate;   Fair Recent;   Fair  Judgement:  Fair  Insight:  Fair  Psychomotor Activity:  Normal  Concentration:  Concentration: Fair and Attention Span: Fair  Recall:  [employer] of Knowledge:  Fair  Language:  Good  Akathisia:  Negative  Handed:  Right  AIMS (if indicated):     Assets:  Communication Skills Desire for Improvement Financial Resources/Insurance Housing Intimacy Leisure Time Physical Health  ADL's:  Intact  Cognition:  Impaired,  Mild  Sleep:         Blood pressure 127/82, pulse 71, temperature 98.7 F (37.1 C), temperature source Oral, resp. rate 18, height 5\' 1"  (1.549 m), weight 122.5 kg, SpO2 100 %.  Recommendations:  Based on my evaluation the patient does not appear to have an emergency medical condition.  Rozetta Nunnery, NP 05/13/2019, 6:00 AM

## 2019-05-13 NOTE — ED Provider Notes (Addendum)
[facility] DEPT Provider Note   CSN: [id number] Arrival date & time: 05/12/19  2146    History   Chief Complaint Chief Complaint  Patient presents with  . Allergic Reaction    HPI Brandi Gomez is a 48 y.o. female with h/o seizures, diabetes, HTN, depression, presents to ER for "irritated mood" for 2 days. States she feels "a certain way" but cant' describe it. Thinks she is having a reaction to duloxetine.  She has been taking this medicine for 6-12 months without recent dose changes.  Last night she kept thinking someone was hiding under her house and that her floor was vibrating.  Has not been sleeping well.  [place] called PCP who advised her to follow up with psychiatrist (Dr Casimiro Needle).  Family reporting hallucinations, mood swings and delusions.  She denies suicidal thoughts but sometimes thinks "its time". No homicidal thoughts or plans. No medical complaints including CP, SOB, cough, vomiting, diarrhea, abdominal pain.      HPI  Past Medical History:  Diagnosis Date  . Anemia   . Anxiety   . Bipolar 1 disorder ([place])   . Common migraine 05/19/2015  . Depression   . Diabetes mellitus, type II ([place])   . Hypertension   . Irritable bowel syndrome (IBS)   . Mild mental retardation   . Obesity   . Partial complex seizure disorder with intractable epilepsy ([place]) 05/12/2014  . Seizures ([place])    intractable, sz 08/23/17  . Sleep apnea   . Stroke ([place])   . Type II or unspecified type diabetes mellitus without mention of complication, not stated as uncontrolled     Patient Active Problem List   Diagnosis Date Noted  . History of recent stroke 08/06/2017  . OSA (obstructive sleep apnea)   . Cryptogenic stroke ([place]) 05/20/2017  . Controlled type 2 diabetes mellitus without complication, without long-term current use of insulin ([place]) 05/08/2017  . Essential hypertension 07/27/2008  . Seizure disorder ([place]) 02/05/2007    Past  Surgical History:  Procedure Laterality Date  . COLONOSCOPY     2012-normal , Dr Sharlett Iles  . ESOPHAGOGASTRODUODENOSCOPY     normal-Dr Patterson 2012  . LOOP RECORDER INSERTION N/A 05/30/2017   Procedure: Loop Recorder Insertion;  Surgeon: Constance Haw, MD;  Location: [place] [facility];  Service: Cardiovascular;  Laterality: N/A;  . LOOP RECORDER REMOVAL N/A 03/04/2018   Procedure: LOOP RECORDER REMOVAL;  Surgeon: Constance Haw, MD;  Location: [place] [facility];  Service: Cardiovascular;  Laterality: N/A;  . MYRINGOTOMY WITH TUBE PLACEMENT    . MYRINGOTOMY WITH TUBE PLACEMENT Right 11/05/2017   Procedure: MYRINGOTOMY WITH TUBE PLACEMENT;  Surgeon: Melissa Montane, MD;  Location: [facility];  Service: ENT;  Laterality: Right;  right T Tube placement  . NASAL SINUS SURGERY    . TEE WITHOUT CARDIOVERSION N/A 05/30/2017   Procedure: TRANSESOPHAGEAL ECHOCARDIOGRAM (TEE);  Surgeon: Acie Fredrickson Wonda Cheng, MD;  Location: [facility] ENDOSCOPY;  Service: Cardiovascular;  Laterality: N/A;     OB History   No obstetric history on file.      Home Medications    Prior to Admission medications   Medication Sig Start Date End Date Taking? Authorizing Provider  atorvastatin (LIPITOR) 10 MG tablet Take 1 tablet (10 mg total) by mouth daily at 6 PM. 05/12/19 08/10/19  Horald Pollen, MD  Blood Glucose Monitoring Suppl KIT Please supply kit covered by insurance and supplies for testing four times daily.  E11.9 E10.9 05/12/19   Horald Pollen, MD  Blood Pressure Monitor KIT Take blood pressure reading twice a day for the next 2 weeks and then once a day. 05/12/19   Horald Pollen, MD  carbamazepine (TEGRETOL) 200 MG tablet Take 1.5 tablets (300 mg total) by mouth 2 (two) times daily at 10 AM and 5 PM. Patient not taking: Reported on 05/12/2019 04/18/19   [name], MD  carvedilol (COREG) 3.125 MG tablet Take 1 tablet (3.125 mg total) by mouth daily. 04/19/19   Horald Pollen, MD   clotrimazole (LOTRIMIN) 1 % cream Apply to affected area 2 times daily Patient taking differently: Apply 1 application topically 2 (two) times daily as needed. rash 05/03/19   Wieters, Hallie C, PA-C  DULoxetine (CYMBALTA) 30 MG capsule Take 1 capsule (30 mg total) by mouth daily. 03/17/19   Plovsky, Berneta Sages, MD  lacosamide (VIMPAT) 200 MG TABS tablet Take 1 tablet (200 mg total) by mouth 2 (two) times daily. 05/10/19   [name], MD  naproxen (NAPROSYN) 375 MG tablet Take 1 tablet (375 mg total) by mouth 2 (two) times daily. 05/03/19   Wieters, Hallie C, PA-C  nystatin (MYCOSTATIN) 100000 UNIT/ML suspension Take 5 mLs (500,000 Units total) by mouth 4 (four) times daily. 05/11/19   Hall-Potvin, [place], PA-C  predniSONE (DELTASONE) 20 MG tablet Take 1 tablet (20 mg total) by mouth daily. 05/11/19   Hall-Potvin, [place], PA-C  sitaGLIPtin (JANUVIA) 50 MG tablet Take 1 tablet (50 mg total) by mouth daily. Schedule appointment for additional refills. 02/15/19   Horald Pollen, MD  torsemide ([place]) 10 MG tablet TAKE 1 TABLET BY MOUTH EVERY DAY 05/11/19   Horald Pollen, MD    Family History Family History  Problem Relation Age of Onset  . Diabetes Mother        passed away from accidental death  . Hypertension Mother   . Bipolar disorder Father   . Diabetes Daughter   . Leukemia Daughter   . Ovarian cancer Maternal Aunt     Social History Social History   Tobacco Use  . Smoking status: Never Smoker  . Smokeless tobacco: Never Used  Substance Use Topics  . Alcohol use: No  . Drug use: No     Allergies   Amoxicillin; Lisinopril; Hydrocodone; and Tegretol [carbamazepine]   Review of Systems Review of Systems  Psychiatric/Behavioral: Positive for confusion, hallucinations and sleep disturbance.  All other systems reviewed and are negative.    Physical Exam Updated Vital Signs BP 109/69 (BP Location: Right Arm)   Pulse 68   Temp 99.4 F (37.4 C) (Oral)   Resp  18   Ht '5\' 1"'$  (1.549 m)   Wt 122.5 kg   SpO2 97%   BMI 51.03 kg/m   Physical Exam Vitals signs and nursing note reviewed.  Constitutional:      General: She is not in acute distress.    Appearance: She is well-developed.     Comments: NAD.  HENT:     Head: Normocephalic and atraumatic.     Right Ear: External ear normal.     Left Ear: External ear normal.     Nose: Nose normal.  Eyes:     General: No scleral icterus.    Conjunctiva/sclera: Conjunctivae normal.  Neck:     Musculoskeletal: Normal range of motion and neck supple.  Cardiovascular:     Rate and Rhythm: Normal rate and regular rhythm.  Heart sounds: Normal heart sounds. No murmur.  Pulmonary:     Effort: Pulmonary effort is normal.     Breath sounds: Normal breath sounds. No wheezing.  Musculoskeletal: Normal range of motion.        General: No deformity.  Skin:    General: Skin is warm and dry.     Capillary Refill: Capillary refill takes less than 2 seconds.  Neurological:     Mental Status: She is alert and oriented to person, place, and time.  Psychiatric:        Mood and Affect: Affect is blunt and flat.        Speech: Speech is delayed.        Cognition and Memory: Memory is impaired.     Comments: Slight delayed responses.  Denies SI, HI. Reports fear of someone hiding under her house, floor shaking.       ED Treatments / Results  Labs (all labs ordered are listed, but only abnormal results are displayed) Labs Reviewed  CBG MONITORING, ED    EKG None  Radiology No results found.  Procedures Procedures (including critical care time)  Medications Ordered in ED Medications - No data to display   Initial Impression / Assessment and Plan / ED Course  I have reviewed the triage vital signs and the nursing notes.  Pertinent labs & imaging results that were available during my care of the patient were reviewed by me and considered in my medical decision making (see chart for details).          48 yo with h/o depression on duloxetine presents for mood issues.  She feels more irritated. Per chart review, family concern for delusions, hallucinations. Pt reports last night she thought someone was under her house, she kept going outside to check.  No SI, HI.  Slight blunted and delayed responses but otherwise cooperative in ER.  Has previous psychiatric history and currently compliant on home medication regimen. No illicit drug or ETOH abuse. Exam is unremarkable. She has no medical complaints. VS WNL. Given h/o hyperglycemia will check CBG although she has no signs or symptoms of diabetic crisis.  No previous psych admission or decompensations in the past. Given new psych symptoms, will have TTS evaluate to determine need for psych stabilization, observation.    Final Clinical Impressions(s) / ED Diagnoses   CBG 90s. TTS eval pending.  Final diagnoses:  Behavior concern in adult    ED Discharge Orders    None         Arlean Hopping 05/13/19 0022    Fatima Blank, MD 05/13/19 [phone]

## 2019-05-14 ENCOUNTER — Encounter (HOSPITAL_COMMUNITY): Payer: Self-pay | Admitting: Emergency Medicine

## 2019-05-14 ENCOUNTER — Emergency Department (HOSPITAL_COMMUNITY): Payer: Medicare HMO

## 2019-05-14 ENCOUNTER — Emergency Department (HOSPITAL_COMMUNITY)
Admission: EM | Admit: 2019-05-14 | Discharge: 2019-05-14 | Disposition: A | Discharge status: Home / Self Care | Payer: Medicare HMO | Attending: Emergency Medicine | Admitting: Emergency Medicine

## 2019-05-14 DIAGNOSIS — E119 Type 2 diabetes mellitus without complications: Secondary | ICD-10-CM | POA: Insufficient documentation

## 2019-05-14 DIAGNOSIS — F99 Mental disorder, not otherwise specified: Secondary | ICD-10-CM | POA: Diagnosis not present

## 2019-05-14 DIAGNOSIS — R1013 Epigastric pain: Secondary | ICD-10-CM | POA: Diagnosis not present

## 2019-05-14 DIAGNOSIS — R072 Precordial pain: Secondary | ICD-10-CM | POA: Diagnosis not present

## 2019-05-14 DIAGNOSIS — R102 Pelvic and perineal pain: Secondary | ICD-10-CM | POA: Insufficient documentation

## 2019-05-14 DIAGNOSIS — Z79899 Other long term (current) drug therapy: Secondary | ICD-10-CM | POA: Diagnosis not present

## 2019-05-14 DIAGNOSIS — R52 Pain, unspecified: Secondary | ICD-10-CM | POA: Diagnosis not present

## 2019-05-14 DIAGNOSIS — Z8673 Personal history of transient ischemic attack (TIA), and cerebral infarction without residual deficits: Secondary | ICD-10-CM | POA: Insufficient documentation

## 2019-05-14 DIAGNOSIS — R079 Chest pain, unspecified: Secondary | ICD-10-CM | POA: Insufficient documentation

## 2019-05-14 DIAGNOSIS — Z7984 Long term (current) use of oral hypoglycemic drugs: Secondary | ICD-10-CM | POA: Insufficient documentation

## 2019-05-14 DIAGNOSIS — I10 Essential (primary) hypertension: Secondary | ICD-10-CM | POA: Insufficient documentation

## 2019-05-14 DIAGNOSIS — R569 Unspecified convulsions: Secondary | ICD-10-CM | POA: Diagnosis not present

## 2019-05-14 DIAGNOSIS — T300 Burn of unspecified body region, unspecified degree: Secondary | ICD-10-CM | POA: Diagnosis not present

## 2019-05-14 LAB — URINALYSIS, ROUTINE W REFLEX MICROSCOPIC
Bilirubin Urine: NEGATIVE
Glucose, UA: NEGATIVE mg/dL
Ketones, ur: 5 mg/dL — AB
Leukocytes,Ua: NEGATIVE
Nitrite: NEGATIVE
Protein, ur: 30 mg/dL — AB
Specific Gravity, Urine: 1.032 — ABNORMAL HIGH (ref 1.005–1.030)
pH: 5 (ref 5.0–8.0)

## 2019-05-14 LAB — CBC
HCT: 34.3 % — ABNORMAL LOW (ref 36.0–46.0)
Hemoglobin: 10.4 g/dL — ABNORMAL LOW (ref 12.0–15.0)
MCH: 28.8 pg (ref 26.0–34.0)
MCHC: 30.3 g/dL (ref 30.0–36.0)
MCV: 95 fL (ref 80.0–100.0)
Platelets: 235 10*3/uL (ref 150–400)
RBC: 3.61 MIL/uL — ABNORMAL LOW (ref 3.87–5.11)
RDW: 14.6 % (ref 11.5–15.5)
WBC: 5.6 10*3/uL (ref 4.0–10.5)
nRBC: 0 % (ref 0.0–0.2)

## 2019-05-14 LAB — COMPREHENSIVE METABOLIC PANEL
ALT: 13 U/L (ref 0–44)
AST: 15 U/L (ref 15–41)
Albumin: 3.5 g/dL (ref 3.5–5.0)
Alkaline Phosphatase: 71 U/L (ref 38–126)
Anion gap: 9 (ref 5–15)
BUN: 17 mg/dL (ref 6–20)
CO2: 25 mmol/L (ref 22–32)
Calcium: 8.2 mg/dL — ABNORMAL LOW (ref 8.9–10.3)
Chloride: 104 mmol/L (ref 98–111)
Creatinine, Ser: 1.18 mg/dL — ABNORMAL HIGH (ref 0.44–1.00)
GFR calc Af Amer: >60 mL/min (ref >60)
GFR calc non Af Amer: 54 mL/min — ABNORMAL LOW (ref >60)
Glucose, Bld: 125 mg/dL — ABNORMAL HIGH (ref 70–99)
Potassium: 3.6 mmol/L (ref 3.5–5.1)
Sodium: 138 mmol/L (ref 135–145)
Total Bilirubin: 0.3 mg/dL (ref 0.3–1.2)
Total Protein: 7.3 g/dL (ref 6.5–8.1)

## 2019-05-14 LAB — WET PREP, GENITAL
Clue Cells Wet Prep HPF POC: NONE SEEN
Sperm: NONE SEEN
Trich, Wet Prep: NONE SEEN
Yeast Wet Prep HPF POC: NONE SEEN

## 2019-05-14 LAB — LIPASE, BLOOD: Lipase: 47 U/L (ref 11–51)

## 2019-05-14 LAB — TROPONIN I
Troponin I: <0.03 ng/mL (ref <0.03)
Troponin I: <0.03 ng/mL (ref <0.03)

## 2019-05-14 LAB — HCG, QUANTITATIVE, PREGNANCY: hCG, Beta Chain, Quant, S: <1 m[IU]/mL (ref <5)

## 2019-05-14 LAB — BRAIN NATRIURETIC PEPTIDE: B Natriuretic Peptide: 22.4 pg/mL (ref 0.0–100.0)

## 2019-05-14 MED ORDER — SODIUM CHLORIDE 0.9% FLUSH: 3.0000 mL | Freq: Once | INTRAVENOUS | Status: DC

## 2019-05-14 NOTE — ED Triage Notes (Signed)
Per EMS-states patient complaining of abdominal pain and pressure in her vaginal area-states she has a history of seizures but hasn't taken meds today-was at [facility] yesterday

## 2019-05-14 NOTE — ED Provider Notes (Signed)
[facility] DEPT Provider Note   CSN: [id number] Arrival date & time: 05/14/19  1047    History   Chief Complaint Chief Complaint  Patient presents with  . Abdominal Pain  . Vaginal Pain    HPI Brandi Gomez is a 48 y.o. female with PMHx depression, anxiety, bipolar disorder, type II DM, seizures, stroke, who presents to the ED complaining of sudden onset, constant, pressure like, epigastric abdominal pain that began about 2 hrs ago. Pt also complains of a "pressure" in her vaginal area and states it felt like something was coming out. She states she has not been sexually active in over a year. Endorses that she still has periods but cannot specifically say when her last period was. Denies fever, chills, shortness of breath, nausea, vomiting, diarrhea, constipation, urinary sx, vaginal discharge, vaginal itching, or any other associated symptoms. Unknown FHx CAD.        Past Medical History:  Diagnosis Date  . Anemia   . Anxiety   . Bipolar 1 disorder ([place])   . Common migraine 05/19/2015  . Depression   . Diabetes mellitus, type II ([place])   . Hypertension   . Irritable bowel syndrome (IBS)   . Mild mental retardation   . Obesity   . Partial complex seizure disorder with intractable epilepsy ([place]) 05/12/2014  . Seizures ([place])    intractable, sz 08/23/17  . Sleep apnea   . Stroke ([place])   . Type II or unspecified type diabetes mellitus without mention of complication, not stated as uncontrolled     Patient Active Problem List   Diagnosis Date Noted  . Bipolar disorder ([place]) 05/13/2019  . Bipolar 1 disorder ([place]) 05/13/2019  . Adjustment disorder with anxiety   . History of recent stroke 08/06/2017  . OSA (obstructive sleep apnea)   . Cryptogenic stroke ([place]) 05/20/2017  . Controlled type 2 diabetes mellitus without complication, without long-term current use of insulin ([place]) 05/08/2017  . Essential hypertension 07/27/2008  . Seizure  disorder ([place]) 02/05/2007    Past Surgical History:  Procedure Laterality Date  . COLONOSCOPY     2012-normal , Dr Sharlett Iles  . ESOPHAGOGASTRODUODENOSCOPY     normal-Dr Patterson 2012  . LOOP RECORDER INSERTION N/A 05/30/2017   Procedure: Loop Recorder Insertion;  Surgeon: Constance Haw, MD;  Location: [facility];  Service: Cardiovascular;  Laterality: N/A;  . LOOP RECORDER REMOVAL N/A 03/04/2018   Procedure: LOOP RECORDER REMOVAL;  Surgeon: Constance Haw, MD;  Location: [facility];  Service: Cardiovascular;  Laterality: N/A;  . MYRINGOTOMY WITH TUBE PLACEMENT    . MYRINGOTOMY WITH TUBE PLACEMENT Right 11/05/2017   Procedure: MYRINGOTOMY WITH TUBE PLACEMENT;  Surgeon: Melissa Montane, MD;  Location: [facility];  Service: ENT;  Laterality: Right;  right T Tube placement  . NASAL SINUS SURGERY    . TEE WITHOUT CARDIOVERSION N/A 05/30/2017   Procedure: TRANSESOPHAGEAL ECHOCARDIOGRAM (TEE);  Surgeon: Acie Fredrickson Wonda Cheng, MD;  Location: [facility] ENDOSCOPY;  Service: Cardiovascular;  Laterality: N/A;     OB History   No obstetric history on file.      Home Medications    Prior to Admission medications   Medication Sig Start Date End Date Taking? Authorizing Provider  atorvastatin (LIPITOR) 10 MG tablet Take 1 tablet (10 mg total) by mouth daily at 6 PM. 05/12/19 08/10/19  Horald Pollen, MD  Blood Glucose Monitoring Suppl KIT Please supply kit covered by insurance and supplies for  testing four times daily. E11.9 E10.9 05/12/19   Horald Pollen, MD  Blood Pressure Monitor KIT Take blood pressure reading twice a day for the next 2 weeks and then once a day. 05/12/19   Horald Pollen, MD  carvedilol (COREG) 3.125 MG tablet Take 1 tablet (3.125 mg total) by mouth daily. 04/19/19   Horald Pollen, MD  clotrimazole (LOTRIMIN) 1 % cream Apply to affected area 2 times daily Patient taking differently: Apply 1 application topically 2 (two) times daily as needed. rash  05/03/19   Wieters, Hallie C, PA-C  DULoxetine (CYMBALTA) 30 MG capsule Take 1 capsule (30 mg total) by mouth daily. 03/17/19   Plovsky, Berneta Sages, MD  lacosamide (VIMPAT) 200 MG TABS tablet Take 1 tablet (200 mg total) by mouth 2 (two) times daily. 05/10/19   [name], MD  naproxen (NAPROSYN) 375 MG tablet Take 1 tablet (375 mg total) by mouth 2 (two) times daily. 05/03/19   Wieters, Hallie C, PA-C  nystatin (MYCOSTATIN) 100000 UNIT/ML suspension Take 5 mLs (500,000 Units total) by mouth 4 (four) times daily. 05/11/19   Hall-Potvin, [place], PA-C  predniSONE (DELTASONE) 20 MG tablet Take 1 tablet (20 mg total) by mouth daily. 05/11/19   Hall-Potvin, [place], PA-C  sitaGLIPtin (JANUVIA) 50 MG tablet Take 1 tablet (50 mg total) by mouth daily. Schedule appointment for additional refills. 02/15/19   Horald Pollen, MD  torsemide (DEMADEX) 10 MG tablet TAKE 1 TABLET BY MOUTH EVERY DAY Patient taking differently: Take 10 mg by mouth daily.  05/11/19   Horald Pollen, MD    Family History Family History  Problem Relation Age of Onset  . Diabetes Mother        passed away from accidental death  . Hypertension Mother   . Bipolar disorder Father   . Diabetes Daughter   . Leukemia Daughter   . Ovarian cancer Maternal Aunt     Social History Social History   Tobacco Use  . Smoking status: Never Smoker  . Smokeless tobacco: Never Used  Substance Use Topics  . Alcohol use: No  . Drug use: No     Allergies   Amoxicillin; Lisinopril; Hydrocodone; and Tegretol [carbamazepine]   Review of Systems Review of Systems  Constitutional: Negative for chills and fever.  HENT: Negative for congestion.   Eyes: Negative for redness.  Respiratory: Negative for cough and shortness of breath.   Cardiovascular: Positive for chest pain.  Gastrointestinal: Positive for abdominal pain. Negative for constipation, diarrhea, nausea and vomiting.  Genitourinary: Positive for pelvic pain. Negative  for dysuria, flank pain, frequency, vaginal bleeding and vaginal discharge.  Musculoskeletal: Negative for myalgias.  Skin: Negative for rash.  Neurological: Negative for weakness and numbness.     Physical Exam Updated Vital Signs BP 129/85 (BP Location: Right Arm)   Pulse 70   Temp 98.6 F (37 C) (Oral)   Resp 18   LMP 04/13/2019   SpO2 98%   Physical Exam Vitals signs and nursing note reviewed.  Constitutional:      Appearance: She is not ill-appearing.  HENT:     Head: Normocephalic and atraumatic.  Eyes:     Conjunctiva/sclera: Conjunctivae normal.  Neck:     Musculoskeletal: Neck supple.  Cardiovascular:     Rate and Rhythm: Normal rate and regular rhythm.  Pulmonary:     Effort: Pulmonary effort is normal.     Breath sounds: Normal breath sounds. No wheezing, rhonchi or rales.  Comments: Tenderness along distal sternum; no crepitus appreciated Chest:     Chest wall: Tenderness present.  Abdominal:     Palpations: Abdomen is soft.     Tenderness: There is no abdominal tenderness. There is no right CVA tenderness, left CVA tenderness, guarding or rebound. Negative signs include Murphy's sign.  Genitourinary:    Comments: Chaperone present for exam. No rashes, lesions, or tenderness to external genitalia. No erythema, injury, or tenderness to vaginal mucosa. Bleeding noted within the vaginal vault. No adnexal masses, tenderness, or fullness. No CMT, cervical friability, or discharge from cervical os. Cervical os is closed.  Skin:    General: Skin is warm and dry.  Neurological:     Mental Status: She is alert.      ED Treatments / Results  Labs (all labs ordered are listed, but only abnormal results are displayed) Labs Reviewed  WET PREP, GENITAL - Abnormal; Notable for the following components:      Result Value   WBC, Wet Prep HPF POC FEW (*)    All other components within normal limits  COMPREHENSIVE METABOLIC PANEL - Abnormal; Notable for the  following components:   Glucose, Bld 125 (*)    Creatinine, Ser 1.18 (*)    Calcium 8.2 (*)    GFR calc non Af Amer 54 (*)    All other components within normal limits  CBC - Abnormal; Notable for the following components:   RBC 3.61 (*)    Hemoglobin 10.4 (*)    HCT 34.3 (*)    All other components within normal limits  URINALYSIS, ROUTINE W REFLEX MICROSCOPIC - Abnormal; Notable for the following components:   APPearance HAZY (*)    Specific Gravity, Urine 1.032 (*)    Hgb urine dipstick LARGE (*)    Ketones, ur 5 (*)    Protein, ur 30 (*)    Bacteria, UA FEW (*)    All other components within normal limits  LIPASE, BLOOD  TROPONIN I  HCG, QUANTITATIVE, PREGNANCY  BRAIN NATRIURETIC PEPTIDE  TROPONIN I  RPR  HIV ANTIBODY (ROUTINE TESTING W REFLEX)  GC/CHLAMYDIA PROBE AMP () NOT AT [facility]    EKG EKG Interpretation  Date/Time:  Friday May 14 2019 13:13:44 EDT Ventricular Rate:  59 PR Interval:    QRS Duration: 99 QT Interval:  420 QTC Calculation: 416 R Axis:   74 Text Interpretation:  Sinus arrhythmia Borderline prolonged PR interval Borderline T abnormalities, anterior leads No significant change since last tracing Confirmed by Lacretia Leigh ([id number]) on 05/14/2019 4:04:05 PM   Radiology Dg Chest Port 1 View  Result Date: 05/14/2019 CLINICAL DATA:  Chest pain. EXAM: PORTABLE CHEST 1 VIEW COMPARISON:  04/12/2018. FINDINGS: Stable mildly enlarged cardiac silhouette and mildly prominent pulmonary vasculature. The lungs are clear. No visible pleural fluid. Unremarkable bones. IMPRESSION: Stable mild cardiomegaly and mild pulmonary vascular congestion. Electronically Signed   By: Claudie Revering M.D.   On: 05/14/2019 13:56    Procedures Procedures (including critical care time)  Medications Ordered in ED Medications - No data to display   Initial Impression / Assessment and Plan / ED Course  I have reviewed the triage vital signs and the nursing notes.   Pertinent labs & imaging results that were available during my care of the patient were reviewed by me and considered in my medical decision making (see chart for details).    Pt is a 48 year old female who extensive psych history who presents complaining of  epigastric/substernal chest pain and vaginal discomfort that began about 2 hours ago. Reports that she feels something wants to come out of her vagina; denies sexual intercourse for over a 1 year; unable to say when LNMP was but endorses she does have periods. Pt is tender to substernal area on exam; no epigastric TTP; no peritoneal signs; low suspicion for ACS but will get baseline bloodwork including CBC, CMP, lipase, Troponin, and U/A as well as CXR and perform pelvic exam. PERC negative.   Upon entering room again to do pelvic exam patient states sensation has gone away in her vagina; proceeded with pelvic exam regardless; small amount of blood visualized in vaginal vault without FB, adnexal or CMT tenderness on exam; no vaginal discharge or cervicitis appreciated; suspect patient may be started her menstrual cycle at this time. Given no complaint of actual pain, more a discomfort/sensation of FB do no feel patient needs pelvic ultrasound at this time.   CBC without leukocytosis; H&H stable; U/A with hgb on dipstick as well as 11-20 per HPF; again consistent with patient being on period. Small amount of bacteria appreciated as well although 6-10 epithelial cells suggests this is contaminated; no LE or nitrites. Creatinine stable and unchanged from previous. Initial trop negative; awaiting remainder of labs. CXR with cardiomegaly and pulmonary congestion; patient denies any complaints of shortness of breath. Satting 100% on RA. Does not appear fluid overloaded but will obtain BNP as well as repeat trop.   BNP within normal limits. Repeat trop negative as well. Will discharge patient home at this time.         Final Clinical Impressions(s) /  ED Diagnoses   Final diagnoses:  Chest pain, unspecified type    ED Discharge Orders    None       Eustaquio Maize, PA-C 05/14/19 1713    Davonna Belling, MD 05/15/19 1113

## 2019-05-14 NOTE — Discharge Instructions (Signed)
You were seen in the ED today for pain underneath your breast; your labwork and xray were reassuring today. Please follow up with your PCP regarding your ED visit. Return to the ED for any worsening symptoms including worsening pain, vomiting, shortness of breath.

## 2019-05-15 ENCOUNTER — Other Ambulatory Visit: Payer: Self-pay | Admitting: Emergency Medicine

## 2019-05-15 DIAGNOSIS — E119 Type 2 diabetes mellitus without complications: Secondary | ICD-10-CM

## 2019-05-15 LAB — RPR: RPR Ser Ql: NONREACTIVE

## 2019-05-15 LAB — HIV ANTIBODY (ROUTINE TESTING W REFLEX): HIV Screen 4th Generation wRfx: NONREACTIVE

## 2019-05-15 NOTE — Telephone Encounter (Signed)
Requested medication (s) are due for refill today: yes  Requested medication (s) are on the active medication list: yes  Last refill:  02/15/19  Future visit scheduled: no  Notes to clinic:  Pt needs a virtual appt (NT not scheduling virtual appts at this time). >3 months without OV. Routing back to practice to advise   Requested Prescriptions  Pending Prescriptions Disp Refills   JANUVIA 50 MG tablet [Pharmacy Med Name: JANUVIA 50 MG TABLET] 90 tablet 0    Sig: TAKE 1 TABLET (50 MG TOTAL) BY MOUTH DAILY. SCHEDULE APPOINTMENT FOR ADDITIONAL REFILLS.     Endocrinology:  Diabetes - DPP-4 Inhibitors Failed - 05/15/2019 12:56 AM      Failed - HBA1C is between 0 and 7.9 and within 180 days    Hemoglobin A1C  Date Value Ref Range Status  11/11/2018 5.8 (A) 4.0 - 5.6 % Final   Hgb A1c MFr Bld  Date Value Ref Range Status  11/05/2017 7.3 (H) 4.8 - 5.6 % Final    Comment:    (NOTE)         Prediabetes: 5.7 - 6.4         Diabetes: >6.4         Glycemic control for adults with diabetes: <7.0          Failed - Cr in normal range and within 360 days    Creat  Date Value Ref Range Status  01/30/2015 0.72 0.50 - 1.10 mg/dL Final   Creatinine, Ser  Date Value Ref Range Status  05/14/2019 1.18 (H) 0.44 - 1.00 mg/dL Final         Failed - Valid encounter within last 6 months    Recent Outpatient Visits          6 months ago Left hip pain   [facility] [place], [place], MD   1 year ago Controlled type 2 diabetes mellitus without complication, without long-term current use of insulin [facility])   Primary Care at [facility], Ines Bloomer, MD   1 year ago Fungal skin infection   [facility] at [facility], Ines Bloomer, MD   1 year ago Essential hypertension   [facility] at [place], Ines Bloomer, MD   1 year ago Acute UTI   Primary Care at [facility], Ines Bloomer, MD

## 2019-05-16 DIAGNOSIS — R569 Unspecified convulsions: Secondary | ICD-10-CM | POA: Diagnosis not present

## 2019-05-16 DIAGNOSIS — G4733 Obstructive sleep apnea (adult) (pediatric): Secondary | ICD-10-CM | POA: Diagnosis not present

## 2019-05-16 DIAGNOSIS — M545 Low back pain: Secondary | ICD-10-CM | POA: Diagnosis not present

## 2019-05-17 LAB — GC/CHLAMYDIA PROBE AMP (~~LOC~~) NOT AT ARMC
Chlamydia: NEGATIVE
Neisseria Gonorrhea: NEGATIVE

## 2019-05-18 ENCOUNTER — Other Ambulatory Visit: Payer: Self-pay | Admitting: Emergency Medicine

## 2019-05-18 DIAGNOSIS — I10 Essential (primary) hypertension: Secondary | ICD-10-CM

## 2019-05-20 ENCOUNTER — Telehealth: Payer: Self-pay | Admitting: Emergency Medicine

## 2019-05-20 NOTE — Telephone Encounter (Signed)
Thanks

## 2019-05-20 NOTE — Telephone Encounter (Signed)
Called Kendra back and verbal orders given via voicemail.

## 2019-05-20 NOTE — Telephone Encounter (Signed)
Copied from [place] [id number]. Topic: Quick Communication - Home Health Verbal Orders >> May 19, 2019  4:00 PM Virl Axe D wrote: Caller/AgencyMadison Hickman Callback Number: [phone] Requesting OT/PT/Skilled Nursing/Social Work/Speech Therapy:PT Frequency: 1 week 1/ 2 week 2

## 2019-05-20 NOTE — Telephone Encounter (Signed)
Routed to clinical

## 2019-05-21 ENCOUNTER — Ambulatory Visit (INDEPENDENT_AMBULATORY_CARE_PROVIDER_SITE_OTHER): Payer: Medicare HMO | Admitting: Psychiatry

## 2019-05-21 ENCOUNTER — Encounter (HOSPITAL_COMMUNITY): Payer: Self-pay | Admitting: Psychiatry

## 2019-05-21 ENCOUNTER — Ambulatory Visit: Payer: Medicare HMO | Admitting: Neurology

## 2019-05-21 ENCOUNTER — Other Ambulatory Visit: Payer: Self-pay

## 2019-05-21 VITALS — BP 128/74 | Ht 61.0 in | Wt 255.0 lb

## 2019-05-21 DIAGNOSIS — F319 Bipolar disorder, unspecified: Secondary | ICD-10-CM

## 2019-05-21 MED ORDER — CARBAMAZEPINE 200 MG PO TABS: ORAL_TABLET | ORAL | 2 refills | Status: DC

## 2019-05-21 MED ORDER — ARIPIPRAZOLE 5 MG PO TABS: 5.0000 mg | ORAL_TABLET | Freq: Every day | ORAL | 4 refills | Status: DC

## 2019-05-21 NOTE — Progress Notes (Signed)
Psychiatric Initial Adult Assessment   Patient Identification: Brandi Gomez MRN:  [id number] Date of Evaluation:  05/21/2019 Referral Source: Dr. Tory Emerald Chief Complaint:  Suspiciousness/paranoia Visit Diagnosis: Mood disorder secondary to CVA  Today the patient was seen with her significant other Brandi Gomez.  Patient apparently spent 1 day in the hospital as again she was found wandering in the community.  I believe this patient's cognitive abilities are very limited.  Her husband describes her as being paranoid and suspicious but most importantly being illogical.  Even in this interview the patient seems to have difficulty organizes her thinking and I think her psychosis is characterized by thought disorder and probably also delusional material.  She is hyperreligious.  She limits people around her watching TV because she thinks statements.  On the other hand the patient is dangerous to herself and that she wanders in bed communities requiring her husband to find your bring her home.  This exactly what happened when she was admitted to the hospital.  There is some question of a seizure disorder.  She takes seizure medicines and will be seeing her doctor in the next few weeks.  She sees Dr. Jannifer Franklin.  The patient is resistant to taking Cymbalta.  I do not think Cymbalta is making much of a difference anyway.  Generally she sleeps and eats well.  She herself denies auditory hallucinations but certainly she has disturbed behavior and problems thinking and organized manner.  I am not 100% sure it is safe for her to live at home.  Interestingly she does have [facility] doing weekly visits.  Associated Signs/Symptoms: Depression Symptoms:  fatigue, (Hypo) Manic Symptoms:   Anxiety Symptoms:   Psychotic Symptoms:   PTSD Symptoms:   Past Psychiatric History: Cymbalta 30 mg Previous Psychotropic Medications: Cymbalta 30 mg  Substance Abuse History in the last 12 months:  No.  Consequences of  Substance Abuse:   Past Medical History:  Past Medical History:  Diagnosis Date  . Anemia   . Anxiety   . Bipolar 1 disorder ([employer])   . Common migraine 05/19/2015  . Depression   . Diabetes mellitus, type II ([place])   . Hypertension   . Irritable bowel syndrome (IBS)   . Mild mental retardation   . Obesity   . Partial complex seizure disorder with intractable epilepsy ([place]) 05/12/2014  . Seizures ([place])    intractable, sz 08/23/17  . Sleep apnea   . Stroke ([place])   . Type II or unspecified type diabetes mellitus without mention of complication, not stated as uncontrolled     Past Surgical History:  Procedure Laterality Date  . COLONOSCOPY     2012-normal , Dr Sharlett Iles  . ESOPHAGOGASTRODUODENOSCOPY     normal-Dr Patterson 2012  . LOOP RECORDER INSERTION N/A 05/30/2017   Procedure: Loop Recorder Insertion;  Surgeon: Constance Haw, MD;  Location: [facility];  Service: Cardiovascular;  Laterality: N/A;  . LOOP RECORDER REMOVAL N/A 03/04/2018   Procedure: LOOP RECORDER REMOVAL;  Surgeon: Constance Haw, MD;  Location: [facility];  Service: Cardiovascular;  Laterality: N/A;  . MYRINGOTOMY WITH TUBE PLACEMENT    . MYRINGOTOMY WITH TUBE PLACEMENT Right 11/05/2017   Procedure: MYRINGOTOMY WITH TUBE PLACEMENT;  Surgeon: Melissa Montane, MD;  Location: [facility];  Service: ENT;  Laterality: Right;  right T Tube placement  . NASAL SINUS SURGERY    . TEE WITHOUT CARDIOVERSION N/A 05/30/2017   Procedure: TRANSESOPHAGEAL ECHOCARDIOGRAM (TEE);  Surgeon: Mertie Moores  J, MD;  Location: [facility];  Service: Cardiovascular;  Laterality: N/A;    Family Psychiatric History:   Family History:  Family History  Problem Relation Age of Onset  . Diabetes Mother        passed away from accidental death  . Hypertension Mother   . Bipolar disorder Father   . Diabetes Daughter   . Leukemia Daughter   . Ovarian cancer Maternal Aunt     Social History:   Social History    Socioeconomic History  . Marital status: Single    Spouse name: Brandi Gomez  . Number of children: 3  . Years of education: 75  . Highest education level: Not on file  Occupational History  . Occupation: disabled    Employer: DISABLED  Social Needs  . Financial resource strain: Not on file  . Food insecurity    Worry: Not on file    Inability: Not on file  . Transportation needs    Medical: Not on file    Non-medical: Not on file  Tobacco Use  . Smoking status: Never Smoker  . Smokeless tobacco: Never Used  Substance and Sexual Activity  . Alcohol use: No  . Drug use: No  . Sexual activity: Never  Lifestyle  . Physical activity    Days per week: Not on file    Minutes per session: Not on file  . Stress: Not on file  Relationships  . Social Herbalist on phone: Not on file    Gets together: Not on file    Attends religious service: Not on file    Active member of club or organization: Not on file    Attends meetings of clubs or organizations: Not on file    Relationship status: Not on file  Other Topics Concern  . Not on file  Social History Narrative   Patient lives at home with daughter.    Patient has 3 children.    Patient is right handed.    Patient has a high school education.    Patient is on disability   Patient drinks 2 cups of caffeine daily.    Additional Social History:   Allergies:   Allergies  Allergen Reactions  . Amoxicillin Itching    Has patient had a PCN reaction causing immediate rash, facial/tongue/throat swelling, SOB or lightheadedness with hypotension: yes Has patient had a PCN reaction causing severe rash involving mucus membranes or skin necrosis: no Has patient had a PCN reaction that required hospitalization: no Has patient had a PCN reaction occurring within the last 10 years: yes If all of the above answers are "NO", then may proceed with Cephalosporin use.  Marland Kitchen Lisinopril Swelling    Angioedema   . Hydrocodone Other  (See Comments)    Depressed   . Tegretol [Carbamazepine] Swelling    Throat swells    Metabolic Disorder Labs: Lab Results  Component Value Date   HGBA1C 5.8 (A) 11/11/2018   MPG 163 11/05/2017   MPG 166 05/21/2017   No results found for: PROLACTIN Lab Results  Component Value Date   CHOL 158 05/21/2017   TRIG 161 (H) 05/21/2017   HDL 44 05/21/2017   CHOLHDL 3.6 05/21/2017   VLDL 32 05/21/2017   LDLCALC 82 05/21/2017     Current Medications: Current Outpatient Medications  Medication Sig Dispense Refill  . atorvastatin (LIPITOR) 10 MG tablet Take 1 tablet (10 mg total) by mouth daily at 6 PM. 90 tablet 3  .  Blood Glucose Monitoring Suppl KIT Please supply kit covered by insurance and supplies for testing four times daily. E11.9 E10.9 1 each 0  . Blood Pressure Monitor KIT Take blood pressure reading twice a day for the next 2 weeks and then once a day. 1 each 0  . carvedilol (COREG) 3.125 MG tablet TAKE 1 TABLET (3.125 MG TOTAL) BY MOUTH DAILY. 30 tablet 1  . clotrimazole (LOTRIMIN) 1 % cream Apply to affected area 2 times daily (Patient taking differently: Apply 1 application topically 2 (two) times daily as needed. rash) 30 g 0  . DULoxetine (CYMBALTA) 30 MG capsule Take 1 capsule (30 mg total) by mouth daily. 30 capsule 6  . JANUVIA 50 MG tablet TAKE 1 TABLET (50 MG TOTAL) BY MOUTH DAILY. SCHEDULE APPOINTMENT FOR ADDITIONAL REFILLS. 90 tablet 0  . lacosamide (VIMPAT) 200 MG TABS tablet Take 1 tablet (200 mg total) by mouth 2 (two) times daily. 180 tablet 0  . naproxen (NAPROSYN) 375 MG tablet Take 1 tablet (375 mg total) by mouth 2 (two) times daily. 20 tablet 0  . nystatin (MYCOSTATIN) 100000 UNIT/ML suspension Take 5 mLs (500,000 Units total) by mouth 4 (four) times daily. 60 mL 0  . predniSONE (DELTASONE) 20 MG tablet Take 1 tablet (20 mg total) by mouth daily. 3 tablet 0  . torsemide (DEMADEX) 10 MG tablet TAKE 1 TABLET BY MOUTH EVERY DAY (Patient taking differently:  Take 10 mg by mouth daily. ) 30 tablet 0  . ARIPiprazole (ABILIFY) 5 MG tablet Take 1 tablet (5 mg total) by mouth daily. 30 tablet 4  . carbamazepine (TEGRETOL) 200 MG tablet 1  bid 60 tablet 2   No current facility-administered medications for this visit.     Neurologic: Headache: No Seizure: Yes Paresthesias:No  Musculoskeletal: Strength & Muscle Tone: within normal limits Gait & Station: normal Patient leans: Backward and N/A  Psychiatric Specialty Exam: ROS  Blood pressure 128/74, height _0  (1.549 m), weight 255 lb (115.7 kg).Body mass index is 48.18 kg/m.  General Appearance: Casual  Eye Contact:  Fair  Speech:  Slurred  Volume:  Normal  Mood:  Dysphoric  Affect:  Appropriate  Thought Process:  Goal Directed  Orientation:  Full (Time, Place, and Person)  Thought Content:  Logical  Suicidal Thoughts:  No  Homicidal Thoughts:  No  Memory:  Negative  Judgement:  Fair  Insight:  Lacking  Psychomotor Activity:  Decreased  Concentration:    Recall:  [place] of Knowledge:Fair  Language: Fair  Akathisia:  No  Handed:    AIMS (if indicated):    Assets:  Desire for Improvement  ADL's:  Intact  Cognition: WNL  Sleep:      Treatment Plan Summary: This patient is #1 problem is that of a mood disorder and probably a thought disorder related to a past CVA.  At this time we will discontinue her Cymbalta.  At this time we will restart her on Tegretol 200 mg twice daily.  Today we will also begin her on Abilify 5 mg every morning.  This patient will be seen again in 6 to 7 weeks.  I do not believe she is overtly dangerous to herself or to anyone else.  I think she has a behavioral disturbance and not sure living independently with her husband is the best for her.  We will discuss this at her next visit. Jerral Ralph, MD 6/12/202011:17 AM

## 2019-05-22 DIAGNOSIS — F319 Bipolar disorder, unspecified: Secondary | ICD-10-CM | POA: Diagnosis not present

## 2019-05-22 DIAGNOSIS — E119 Type 2 diabetes mellitus without complications: Secondary | ICD-10-CM | POA: Diagnosis not present

## 2019-05-22 DIAGNOSIS — G4733 Obstructive sleep apnea (adult) (pediatric): Secondary | ICD-10-CM | POA: Diagnosis not present

## 2019-05-22 DIAGNOSIS — F419 Anxiety disorder, unspecified: Secondary | ICD-10-CM | POA: Diagnosis not present

## 2019-05-22 DIAGNOSIS — D649 Anemia, unspecified: Secondary | ICD-10-CM | POA: Diagnosis not present

## 2019-05-22 DIAGNOSIS — G40211 Localization-related (focal) (partial) symptomatic epilepsy and epileptic syndromes with complex partial seizures, intractable, with status epilepticus: Secondary | ICD-10-CM | POA: Diagnosis not present

## 2019-05-22 DIAGNOSIS — I10 Essential (primary) hypertension: Secondary | ICD-10-CM | POA: Diagnosis not present

## 2019-05-22 DIAGNOSIS — Z8673 Personal history of transient ischemic attack (TIA), and cerebral infarction without residual deficits: Secondary | ICD-10-CM | POA: Diagnosis not present

## 2019-05-22 DIAGNOSIS — Z7984 Long term (current) use of oral hypoglycemic drugs: Secondary | ICD-10-CM | POA: Diagnosis not present

## 2019-05-24 DIAGNOSIS — F319 Bipolar disorder, unspecified: Secondary | ICD-10-CM | POA: Diagnosis not present

## 2019-05-24 DIAGNOSIS — G4733 Obstructive sleep apnea (adult) (pediatric): Secondary | ICD-10-CM | POA: Diagnosis not present

## 2019-05-24 DIAGNOSIS — D649 Anemia, unspecified: Secondary | ICD-10-CM | POA: Diagnosis not present

## 2019-05-24 DIAGNOSIS — Z7984 Long term (current) use of oral hypoglycemic drugs: Secondary | ICD-10-CM | POA: Diagnosis not present

## 2019-05-24 DIAGNOSIS — G40211 Localization-related (focal) (partial) symptomatic epilepsy and epileptic syndromes with complex partial seizures, intractable, with status epilepticus: Secondary | ICD-10-CM | POA: Diagnosis not present

## 2019-05-24 DIAGNOSIS — Z8673 Personal history of transient ischemic attack (TIA), and cerebral infarction without residual deficits: Secondary | ICD-10-CM | POA: Diagnosis not present

## 2019-05-24 DIAGNOSIS — E119 Type 2 diabetes mellitus without complications: Secondary | ICD-10-CM | POA: Diagnosis not present

## 2019-05-24 DIAGNOSIS — I10 Essential (primary) hypertension: Secondary | ICD-10-CM | POA: Diagnosis not present

## 2019-05-24 DIAGNOSIS — F419 Anxiety disorder, unspecified: Secondary | ICD-10-CM | POA: Diagnosis not present

## 2019-05-25 ENCOUNTER — Telehealth (HOSPITAL_COMMUNITY): Payer: Self-pay

## 2019-05-25 DIAGNOSIS — G4733 Obstructive sleep apnea (adult) (pediatric): Secondary | ICD-10-CM | POA: Diagnosis not present

## 2019-05-25 DIAGNOSIS — F319 Bipolar disorder, unspecified: Secondary | ICD-10-CM | POA: Diagnosis not present

## 2019-05-25 DIAGNOSIS — F419 Anxiety disorder, unspecified: Secondary | ICD-10-CM | POA: Diagnosis not present

## 2019-05-25 DIAGNOSIS — E119 Type 2 diabetes mellitus without complications: Secondary | ICD-10-CM | POA: Diagnosis not present

## 2019-05-25 DIAGNOSIS — Z7984 Long term (current) use of oral hypoglycemic drugs: Secondary | ICD-10-CM | POA: Diagnosis not present

## 2019-05-25 DIAGNOSIS — D649 Anemia, unspecified: Secondary | ICD-10-CM | POA: Diagnosis not present

## 2019-05-25 DIAGNOSIS — Z8673 Personal history of transient ischemic attack (TIA), and cerebral infarction without residual deficits: Secondary | ICD-10-CM | POA: Diagnosis not present

## 2019-05-25 DIAGNOSIS — I10 Essential (primary) hypertension: Secondary | ICD-10-CM | POA: Diagnosis not present

## 2019-05-25 DIAGNOSIS — G40211 Localization-related (focal) (partial) symptomatic epilepsy and epileptic syndromes with complex partial seizures, intractable, with status epilepticus: Secondary | ICD-10-CM | POA: Diagnosis not present

## 2019-05-25 NOTE — Telephone Encounter (Signed)
Patient called and was concerned about the Abilify, she read the pamphlet and it says to let the doctor know if she has ever had a stroke. Patient states that she has had a stroke in the past and wants to know if it is still okay to take the Abilify. She is on a 5 mg dose. Please review and advise, thank you.

## 2019-05-27 DIAGNOSIS — G40211 Localization-related (focal) (partial) symptomatic epilepsy and epileptic syndromes with complex partial seizures, intractable, with status epilepticus: Secondary | ICD-10-CM | POA: Diagnosis not present

## 2019-05-27 DIAGNOSIS — I10 Essential (primary) hypertension: Secondary | ICD-10-CM | POA: Diagnosis not present

## 2019-05-27 DIAGNOSIS — Z8673 Personal history of transient ischemic attack (TIA), and cerebral infarction without residual deficits: Secondary | ICD-10-CM | POA: Diagnosis not present

## 2019-05-27 DIAGNOSIS — Z7984 Long term (current) use of oral hypoglycemic drugs: Secondary | ICD-10-CM | POA: Diagnosis not present

## 2019-05-27 DIAGNOSIS — E119 Type 2 diabetes mellitus without complications: Secondary | ICD-10-CM | POA: Diagnosis not present

## 2019-05-27 DIAGNOSIS — F419 Anxiety disorder, unspecified: Secondary | ICD-10-CM | POA: Diagnosis not present

## 2019-05-27 DIAGNOSIS — G4733 Obstructive sleep apnea (adult) (pediatric): Secondary | ICD-10-CM | POA: Diagnosis not present

## 2019-05-27 DIAGNOSIS — D649 Anemia, unspecified: Secondary | ICD-10-CM | POA: Diagnosis not present

## 2019-05-27 DIAGNOSIS — F319 Bipolar disorder, unspecified: Secondary | ICD-10-CM | POA: Diagnosis not present

## 2019-05-28 DIAGNOSIS — F419 Anxiety disorder, unspecified: Secondary | ICD-10-CM | POA: Diagnosis not present

## 2019-05-28 DIAGNOSIS — Z7984 Long term (current) use of oral hypoglycemic drugs: Secondary | ICD-10-CM | POA: Diagnosis not present

## 2019-05-28 DIAGNOSIS — I10 Essential (primary) hypertension: Secondary | ICD-10-CM | POA: Diagnosis not present

## 2019-05-28 DIAGNOSIS — Z8673 Personal history of transient ischemic attack (TIA), and cerebral infarction without residual deficits: Secondary | ICD-10-CM | POA: Diagnosis not present

## 2019-05-28 DIAGNOSIS — E119 Type 2 diabetes mellitus without complications: Secondary | ICD-10-CM | POA: Diagnosis not present

## 2019-05-28 DIAGNOSIS — G4733 Obstructive sleep apnea (adult) (pediatric): Secondary | ICD-10-CM | POA: Diagnosis not present

## 2019-05-28 DIAGNOSIS — G40211 Localization-related (focal) (partial) symptomatic epilepsy and epileptic syndromes with complex partial seizures, intractable, with status epilepticus: Secondary | ICD-10-CM | POA: Diagnosis not present

## 2019-05-28 DIAGNOSIS — D649 Anemia, unspecified: Secondary | ICD-10-CM | POA: Diagnosis not present

## 2019-05-28 DIAGNOSIS — F319 Bipolar disorder, unspecified: Secondary | ICD-10-CM | POA: Diagnosis not present

## 2019-05-31 DIAGNOSIS — D649 Anemia, unspecified: Secondary | ICD-10-CM | POA: Diagnosis not present

## 2019-05-31 DIAGNOSIS — Z8673 Personal history of transient ischemic attack (TIA), and cerebral infarction without residual deficits: Secondary | ICD-10-CM | POA: Diagnosis not present

## 2019-05-31 DIAGNOSIS — G40211 Localization-related (focal) (partial) symptomatic epilepsy and epileptic syndromes with complex partial seizures, intractable, with status epilepticus: Secondary | ICD-10-CM | POA: Diagnosis not present

## 2019-05-31 DIAGNOSIS — F319 Bipolar disorder, unspecified: Secondary | ICD-10-CM | POA: Diagnosis not present

## 2019-05-31 DIAGNOSIS — E119 Type 2 diabetes mellitus without complications: Secondary | ICD-10-CM | POA: Diagnosis not present

## 2019-05-31 DIAGNOSIS — G4733 Obstructive sleep apnea (adult) (pediatric): Secondary | ICD-10-CM | POA: Diagnosis not present

## 2019-05-31 DIAGNOSIS — Z7984 Long term (current) use of oral hypoglycemic drugs: Secondary | ICD-10-CM | POA: Diagnosis not present

## 2019-05-31 DIAGNOSIS — I10 Essential (primary) hypertension: Secondary | ICD-10-CM | POA: Diagnosis not present

## 2019-05-31 DIAGNOSIS — F419 Anxiety disorder, unspecified: Secondary | ICD-10-CM | POA: Diagnosis not present

## 2019-06-01 ENCOUNTER — Ambulatory Visit: Payer: Medicare HMO | Admitting: Emergency Medicine

## 2019-06-02 ENCOUNTER — Other Ambulatory Visit: Payer: Self-pay | Admitting: Emergency Medicine

## 2019-06-02 DIAGNOSIS — G4733 Obstructive sleep apnea (adult) (pediatric): Secondary | ICD-10-CM | POA: Diagnosis not present

## 2019-06-02 DIAGNOSIS — E119 Type 2 diabetes mellitus without complications: Secondary | ICD-10-CM | POA: Diagnosis not present

## 2019-06-02 DIAGNOSIS — I10 Essential (primary) hypertension: Secondary | ICD-10-CM

## 2019-06-02 DIAGNOSIS — F319 Bipolar disorder, unspecified: Secondary | ICD-10-CM | POA: Diagnosis not present

## 2019-06-02 DIAGNOSIS — F419 Anxiety disorder, unspecified: Secondary | ICD-10-CM | POA: Diagnosis not present

## 2019-06-02 DIAGNOSIS — D649 Anemia, unspecified: Secondary | ICD-10-CM | POA: Diagnosis not present

## 2019-06-02 DIAGNOSIS — Z8673 Personal history of transient ischemic attack (TIA), and cerebral infarction without residual deficits: Secondary | ICD-10-CM | POA: Diagnosis not present

## 2019-06-02 DIAGNOSIS — G40211 Localization-related (focal) (partial) symptomatic epilepsy and epileptic syndromes with complex partial seizures, intractable, with status epilepticus: Secondary | ICD-10-CM | POA: Diagnosis not present

## 2019-06-02 DIAGNOSIS — Z7984 Long term (current) use of oral hypoglycemic drugs: Secondary | ICD-10-CM | POA: Diagnosis not present

## 2019-06-02 NOTE — Telephone Encounter (Signed)
Requested medication (s) are due for refill today: yes  Requested medication (s) are on the active medication list: yes  Last refill:  05/11/2019  Future visit scheduled: No  Notes to clinic:  No valid encounter in last 6 months    Requested Prescriptions  Pending Prescriptions Disp Refills   torsemide (DEMADEX) 10 MG tablet [Pharmacy Med Name: TORSEMIDE 10 MG TABLET] 30 tablet 0    Sig: TAKE 1 TABLET BY MOUTH EVERY DAY     Cardiovascular:  Diuretics - Loop Failed - 06/02/2019 11:33 AM      Failed - Ca in normal range and within 360 days    Calcium  Date Value Ref Range Status  05/14/2019 8.2 (L) 8.9 - 10.3 mg/dL Final         Failed - Cr in normal range and within 360 days    Creat  Date Value Ref Range Status  01/30/2015 0.72 0.50 - 1.10 mg/dL Final   Creatinine, Ser  Date Value Ref Range Status  05/14/2019 1.18 (H) 0.44 - 1.00 mg/dL Final         Failed - Valid encounter within last 6 months    Recent Outpatient Visits          6 months ago Left hip pain   [facility] at [place], Ines Bloomer, MD   1 year ago Controlled type 2 diabetes mellitus without complication, without long-term current use of insulin [facility])   [facility] at [place], Ines Bloomer, MD   1 year ago Fungal skin infection   [facility] at [place], Ines Bloomer, MD   1 year ago Essential hypertension   [facility] at [place], [place], MD   1 year ago Acute UTI   [facility] at [place], [place], MD             [place] in normal range and within 360 days    Potassium  Date Value Ref Range Status  05/14/2019 3.6 3.5 - 5.1 mmol/L Final         Passed - Na in normal range and within 360 days    Sodium  Date Value Ref Range Status  05/14/2019 138 135 - 145 mmol/L Final  11/11/2018 139 134 - 144 mmol/L Final         Passed - Last BP in normal range    BP Readings from Last 1 Encounters:  05/14/19 132/84

## 2019-06-03 ENCOUNTER — Telehealth: Payer: Self-pay | Admitting: *Deleted

## 2019-06-03 DIAGNOSIS — D649 Anemia, unspecified: Secondary | ICD-10-CM | POA: Diagnosis not present

## 2019-06-03 DIAGNOSIS — Z7984 Long term (current) use of oral hypoglycemic drugs: Secondary | ICD-10-CM | POA: Diagnosis not present

## 2019-06-03 DIAGNOSIS — I10 Essential (primary) hypertension: Secondary | ICD-10-CM | POA: Diagnosis not present

## 2019-06-03 DIAGNOSIS — G4733 Obstructive sleep apnea (adult) (pediatric): Secondary | ICD-10-CM | POA: Diagnosis not present

## 2019-06-03 DIAGNOSIS — Z8673 Personal history of transient ischemic attack (TIA), and cerebral infarction without residual deficits: Secondary | ICD-10-CM | POA: Diagnosis not present

## 2019-06-03 DIAGNOSIS — F319 Bipolar disorder, unspecified: Secondary | ICD-10-CM | POA: Diagnosis not present

## 2019-06-03 DIAGNOSIS — F419 Anxiety disorder, unspecified: Secondary | ICD-10-CM | POA: Diagnosis not present

## 2019-06-03 DIAGNOSIS — E119 Type 2 diabetes mellitus without complications: Secondary | ICD-10-CM | POA: Diagnosis not present

## 2019-06-03 DIAGNOSIS — G40211 Localization-related (focal) (partial) symptomatic epilepsy and epileptic syndromes with complex partial seizures, intractable, with status epilepticus: Secondary | ICD-10-CM | POA: Diagnosis not present

## 2019-06-03 NOTE — Telephone Encounter (Signed)
Faxed signed orders for 05/20/2019 and 06/01/2019 to [facility]. Confirmation page received for both orders.

## 2019-06-04 ENCOUNTER — Telehealth: Payer: Self-pay | Admitting: Emergency Medicine

## 2019-06-04 DIAGNOSIS — D649 Anemia, unspecified: Secondary | ICD-10-CM | POA: Diagnosis not present

## 2019-06-04 DIAGNOSIS — G4733 Obstructive sleep apnea (adult) (pediatric): Secondary | ICD-10-CM | POA: Diagnosis not present

## 2019-06-04 DIAGNOSIS — F419 Anxiety disorder, unspecified: Secondary | ICD-10-CM | POA: Diagnosis not present

## 2019-06-04 DIAGNOSIS — Z8673 Personal history of transient ischemic attack (TIA), and cerebral infarction without residual deficits: Secondary | ICD-10-CM | POA: Diagnosis not present

## 2019-06-04 DIAGNOSIS — Z7984 Long term (current) use of oral hypoglycemic drugs: Secondary | ICD-10-CM | POA: Diagnosis not present

## 2019-06-04 DIAGNOSIS — G40211 Localization-related (focal) (partial) symptomatic epilepsy and epileptic syndromes with complex partial seizures, intractable, with status epilepticus: Secondary | ICD-10-CM | POA: Diagnosis not present

## 2019-06-04 DIAGNOSIS — F319 Bipolar disorder, unspecified: Secondary | ICD-10-CM | POA: Diagnosis not present

## 2019-06-04 DIAGNOSIS — E119 Type 2 diabetes mellitus without complications: Secondary | ICD-10-CM | POA: Diagnosis not present

## 2019-06-04 DIAGNOSIS — I10 Essential (primary) hypertension: Secondary | ICD-10-CM | POA: Diagnosis not present

## 2019-06-04 NOTE — Telephone Encounter (Signed)
Caller: Angus Palms, RN with [facility]  Requesting Nursing  Frequency: 1w4 with 2prn for dizziness, medication management and education   [phone] VM Ok

## 2019-06-08 ENCOUNTER — Encounter: Payer: Self-pay | Admitting: *Deleted

## 2019-06-08 DIAGNOSIS — E119 Type 2 diabetes mellitus without complications: Secondary | ICD-10-CM | POA: Diagnosis not present

## 2019-06-08 DIAGNOSIS — H5213 Myopia, bilateral: Secondary | ICD-10-CM | POA: Diagnosis not present

## 2019-06-08 LAB — HM DIABETES EYE EXAM

## 2019-06-09 ENCOUNTER — Ambulatory Visit: Payer: Medicare HMO | Admitting: Emergency Medicine

## 2019-06-09 DIAGNOSIS — Z8673 Personal history of transient ischemic attack (TIA), and cerebral infarction without residual deficits: Secondary | ICD-10-CM | POA: Diagnosis not present

## 2019-06-09 DIAGNOSIS — F319 Bipolar disorder, unspecified: Secondary | ICD-10-CM | POA: Diagnosis not present

## 2019-06-09 DIAGNOSIS — G4733 Obstructive sleep apnea (adult) (pediatric): Secondary | ICD-10-CM | POA: Diagnosis not present

## 2019-06-09 DIAGNOSIS — Z7984 Long term (current) use of oral hypoglycemic drugs: Secondary | ICD-10-CM | POA: Diagnosis not present

## 2019-06-09 DIAGNOSIS — I10 Essential (primary) hypertension: Secondary | ICD-10-CM | POA: Diagnosis not present

## 2019-06-09 DIAGNOSIS — E119 Type 2 diabetes mellitus without complications: Secondary | ICD-10-CM | POA: Diagnosis not present

## 2019-06-09 DIAGNOSIS — D649 Anemia, unspecified: Secondary | ICD-10-CM | POA: Diagnosis not present

## 2019-06-09 DIAGNOSIS — F419 Anxiety disorder, unspecified: Secondary | ICD-10-CM | POA: Diagnosis not present

## 2019-06-09 DIAGNOSIS — G40211 Localization-related (focal) (partial) symptomatic epilepsy and epileptic syndromes with complex partial seizures, intractable, with status epilepticus: Secondary | ICD-10-CM | POA: Diagnosis not present

## 2019-06-10 DIAGNOSIS — F319 Bipolar disorder, unspecified: Secondary | ICD-10-CM | POA: Diagnosis not present

## 2019-06-10 DIAGNOSIS — G40211 Localization-related (focal) (partial) symptomatic epilepsy and epileptic syndromes with complex partial seizures, intractable, with status epilepticus: Secondary | ICD-10-CM | POA: Diagnosis not present

## 2019-06-10 DIAGNOSIS — E119 Type 2 diabetes mellitus without complications: Secondary | ICD-10-CM | POA: Diagnosis not present

## 2019-06-10 DIAGNOSIS — I10 Essential (primary) hypertension: Secondary | ICD-10-CM | POA: Diagnosis not present

## 2019-06-10 DIAGNOSIS — G4733 Obstructive sleep apnea (adult) (pediatric): Secondary | ICD-10-CM | POA: Diagnosis not present

## 2019-06-10 DIAGNOSIS — D649 Anemia, unspecified: Secondary | ICD-10-CM | POA: Diagnosis not present

## 2019-06-10 DIAGNOSIS — Z7984 Long term (current) use of oral hypoglycemic drugs: Secondary | ICD-10-CM | POA: Diagnosis not present

## 2019-06-10 DIAGNOSIS — Z8673 Personal history of transient ischemic attack (TIA), and cerebral infarction without residual deficits: Secondary | ICD-10-CM | POA: Diagnosis not present

## 2019-06-10 DIAGNOSIS — F419 Anxiety disorder, unspecified: Secondary | ICD-10-CM | POA: Diagnosis not present

## 2019-06-12 ENCOUNTER — Other Ambulatory Visit: Payer: Self-pay | Admitting: Emergency Medicine

## 2019-06-12 ENCOUNTER — Other Ambulatory Visit (HOSPITAL_COMMUNITY): Payer: Self-pay | Admitting: Psychiatry

## 2019-06-12 DIAGNOSIS — I10 Essential (primary) hypertension: Secondary | ICD-10-CM

## 2019-06-12 NOTE — Telephone Encounter (Signed)
Requested medication (s) are due for refill today: yes  Requested medication (s) are on the active medication list: yes  Last refill:  05/18/19  Future visit scheduled: no  Notes to clinic:  > 3 months overdue for OV. Please advise.   Requested Prescriptions  Pending Prescriptions Disp Refills   carvedilol (COREG) 3.125 MG tablet [Pharmacy Med Name: CARVEDILOL 3.125 MG TABLET] 30 tablet 1    Sig: TAKE 1 TABLET BY MOUTH EVERY DAY     Cardiovascular:  Beta Blockers Failed - 06/12/2019  9:33 AM      Failed - Valid encounter within last 6 months    Recent Outpatient Visits          7 months ago Left hip pain   [facility] at [place], [place], MD   1 year ago Controlled type 2 diabetes mellitus without complication, without long-term current use of insulin [facility])   Primary Care at [facility], Ines Bloomer, MD   1 year ago Fungal skin infection   Primary Care at [facility], Ines Bloomer, MD   1 year ago Essential hypertension   [facility] at [place], Ines Bloomer, MD   1 year ago Acute UTI   [facility] at [place], [employer] [place], MD             [place] BP in normal range    BP Readings from Last 1 Encounters:  05/14/19 132/84         Passed - Last Heart Rate in normal range    Pulse Readings from Last 1 Encounters:  05/14/19 (!) 49

## 2019-06-14 DIAGNOSIS — G4733 Obstructive sleep apnea (adult) (pediatric): Secondary | ICD-10-CM | POA: Diagnosis not present

## 2019-06-15 ENCOUNTER — Other Ambulatory Visit: Payer: Self-pay

## 2019-06-15 ENCOUNTER — Emergency Department (HOSPITAL_COMMUNITY)
Admission: EM | Admit: 2019-06-15 | Discharge: 2019-06-15 | Disposition: A | Discharge status: Home / Self Care | Payer: Medicare HMO | Attending: Emergency Medicine | Admitting: Emergency Medicine

## 2019-06-15 ENCOUNTER — Emergency Department (HOSPITAL_COMMUNITY): Payer: Medicare HMO

## 2019-06-15 ENCOUNTER — Encounter (HOSPITAL_COMMUNITY): Payer: Self-pay

## 2019-06-15 DIAGNOSIS — E119 Type 2 diabetes mellitus without complications: Secondary | ICD-10-CM | POA: Insufficient documentation

## 2019-06-15 DIAGNOSIS — M545 Low back pain: Secondary | ICD-10-CM | POA: Diagnosis not present

## 2019-06-15 DIAGNOSIS — R569 Unspecified convulsions: Secondary | ICD-10-CM | POA: Diagnosis not present

## 2019-06-15 DIAGNOSIS — Z20828 Contact with and (suspected) exposure to other viral communicable diseases: Secondary | ICD-10-CM | POA: Diagnosis not present

## 2019-06-15 DIAGNOSIS — R059 Cough, unspecified: Secondary | ICD-10-CM

## 2019-06-15 DIAGNOSIS — G4733 Obstructive sleep apnea (adult) (pediatric): Secondary | ICD-10-CM | POA: Diagnosis not present

## 2019-06-15 DIAGNOSIS — R05 Cough: Secondary | ICD-10-CM

## 2019-06-15 DIAGNOSIS — I10 Essential (primary) hypertension: Secondary | ICD-10-CM | POA: Insufficient documentation

## 2019-06-15 MED ORDER — TORSEMIDE 10 MG PO TABS: 10.0000 mg | ORAL_TABLET | Freq: Every day | ORAL | 0 refills | Status: DC

## 2019-06-15 NOTE — ED Provider Notes (Signed)
[facility] DEPT Provider Note   CSN: [id number] Arrival date & time: 06/15/19  1532    History   Chief Complaint Chief Complaint  Patient presents with  . Cough    HPI Brandi Gomez is a 48 y.o. female with PMHx HTN, Diabetes, bipolar disorder, depression, who presents to the ED today complaining of an intermittent cough for the past week. Pt also complains of a rash to her left forearm that she noticed a week ago; no itching noted to rash. No pain. No new hygiene products, foods, medicines. She is concerned she could have covid 19 and wants to be tested. Pt reports she had an appointment with her PCP on 07/01 but missed it after she "forgot." No known covid 19 exposure. Denies fever, chills, shortness of breath, tongue swelling, lip swelling, or any other associated symptoms.        Past Medical History:  Diagnosis Date  . Anemia   . Anxiety   . Bipolar 1 disorder ([place])   . Common migraine 05/19/2015  . Depression   . Diabetes mellitus, type II ([place])   . Hypertension   . Irritable bowel syndrome (IBS)   . Mild mental retardation   . Obesity   . Partial complex seizure disorder with intractable epilepsy ([place]) 05/12/2014  . Seizures ([place])    intractable, sz 08/23/17  . Sleep apnea   . Stroke ([place])   . Type II or unspecified type diabetes mellitus without mention of complication, not stated as uncontrolled     Patient Active Problem List   Diagnosis Date Noted  . Bipolar disorder ([place]) 05/13/2019  . Bipolar 1 disorder ([place]) 05/13/2019  . Adjustment disorder with anxiety   . History of recent stroke 08/06/2017  . OSA (obstructive sleep apnea)   . Cryptogenic stroke ([place]) 05/20/2017  . Controlled type 2 diabetes mellitus without complication, without long-term current use of insulin ([place]) 05/08/2017  . Essential hypertension 07/27/2008  . Seizure disorder ([place]) 02/05/2007    Past Surgical History:  Procedure Laterality Date  .  COLONOSCOPY     2012-normal , Dr Sharlett Iles  . ESOPHAGOGASTRODUODENOSCOPY     normal-Dr Patterson 2012  . LOOP RECORDER INSERTION N/A 05/30/2017   Procedure: Loop Recorder Insertion;  Surgeon: Constance Haw, MD;  Location: [facility];  Service: Cardiovascular;  Laterality: N/A;  . LOOP RECORDER REMOVAL N/A 03/04/2018   Procedure: LOOP RECORDER REMOVAL;  Surgeon: Constance Haw, MD;  Location: [facility];  Service: Cardiovascular;  Laterality: N/A;  . MYRINGOTOMY WITH TUBE PLACEMENT    . MYRINGOTOMY WITH TUBE PLACEMENT Right 11/05/2017   Procedure: MYRINGOTOMY WITH TUBE PLACEMENT;  Surgeon: Melissa Montane, MD;  Location: [facility];  Service: ENT;  Laterality: Right;  right T Tube placement  . NASAL SINUS SURGERY    . TEE WITHOUT CARDIOVERSION N/A 05/30/2017   Procedure: TRANSESOPHAGEAL ECHOCARDIOGRAM (TEE);  Surgeon: Acie Fredrickson Wonda Cheng, MD;  Location: [facility];  Service: Cardiovascular;  Laterality: N/A;     OB History   No obstetric history on file.      Home Medications    Prior to Admission medications   Medication Sig Start Date End Date Taking? Authorizing Provider  ARIPiprazole (ABILIFY) 5 MG tablet Take 1 tablet (5 mg total) by mouth daily. 05/21/19   Plovsky, Berneta Sages, MD  atorvastatin (LIPITOR) 10 MG tablet Take 1 tablet (10 mg total) by mouth daily at 6 PM. 05/12/19 08/10/19  Horald Pollen, MD  Blood Glucose Monitoring Suppl KIT Please supply kit covered by insurance and supplies for testing four times daily. E11.9 E10.9 05/12/19   Horald Pollen, MD  Blood Pressure Monitor KIT Take blood pressure reading twice a day for the next 2 weeks and then once a day. 05/12/19   Horald Pollen, MD  carbamazepine (TEGRETOL) 200 MG tablet 1  bid 05/21/19   Plovsky, Berneta Sages, MD  carvedilol (COREG) 3.125 MG tablet TAKE 1 TABLET (3.125 MG TOTAL) BY MOUTH DAILY. 05/18/19   Horald Pollen, MD  clotrimazole (LOTRIMIN) 1 % cream Apply to affected area 2 times  daily Patient taking differently: Apply 1 application topically 2 (two) times daily as needed. rash 05/03/19   Wieters, Hallie C, PA-C  DULoxetine (CYMBALTA) 30 MG capsule Take 1 capsule (30 mg total) by mouth daily. 03/17/19   Plovsky, Berneta Sages, MD  JANUVIA 50 MG tablet TAKE 1 TABLET (50 MG TOTAL) BY MOUTH DAILY. SCHEDULE APPOINTMENT FOR ADDITIONAL REFILLS. 05/17/19   Horald Pollen, MD  lacosamide (VIMPAT) 200 MG TABS tablet Take 1 tablet (200 mg total) by mouth 2 (two) times daily. 05/10/19   [name], MD  naproxen (NAPROSYN) 375 MG tablet Take 1 tablet (375 mg total) by mouth 2 (two) times daily. 05/03/19   Wieters, Hallie C, PA-C  nystatin (MYCOSTATIN) 100000 UNIT/ML suspension Take 5 mLs (500,000 Units total) by mouth 4 (four) times daily. 05/11/19   Hall-Potvin, [place], PA-C  predniSONE (DELTASONE) 20 MG tablet Take 1 tablet (20 mg total) by mouth daily. 05/11/19   Hall-Potvin, [place], PA-C  torsemide (DEMADEX) 10 MG tablet Take 1 tablet (10 mg total) by mouth daily for 7 days. 06/15/19 06/22/19  Eustaquio Maize, PA-C    Family History Family History  Problem Relation Age of Onset  . Diabetes Mother        passed away from accidental death  . Hypertension Mother   . Bipolar disorder Father   . Diabetes Daughter   . Leukemia Daughter   . Ovarian cancer Maternal Aunt     Social History Social History   Tobacco Use  . Smoking status: Never Smoker  . Smokeless tobacco: Never Used  Substance Use Topics  . Alcohol use: No  . Drug use: No     Allergies   Amoxicillin, Lisinopril, Hydrocodone, and Tegretol [carbamazepine]   Review of Systems Review of Systems  Constitutional: Negative for chills and fever.  Respiratory: Positive for cough. Negative for shortness of breath.      Physical Exam Updated Vital Signs BP (!) 153/77 (BP Location: Left Arm)   Pulse (!) 51   Temp 98.1 F (36.7 C) (Oral)   Resp 16   Ht '5\' 1"'$  (1.549 m)   Wt 119.3 kg   SpO2 100%   BMI  49.69 kg/m   Physical Exam Vitals signs and nursing note reviewed.  Constitutional:      Appearance: She is obese. She is not ill-appearing.  HENT:     Head: Normocephalic and atraumatic.  Eyes:     Conjunctiva/sclera: Conjunctivae normal.  Cardiovascular:     Rate and Rhythm: Normal rate and regular rhythm.  Pulmonary:     Effort: Pulmonary effort is normal.     Breath sounds: Normal breath sounds.  Musculoskeletal:     Comments: Large hyperpigmented papule to left proximal forearm  Skin:    General: Skin is warm and dry.     Coloration: Skin is not jaundiced.  Neurological:  Mental Status: She is alert.      ED Treatments / Results  Labs (all labs ordered are listed, but only abnormal results are displayed) Labs Reviewed  NOVEL CORONAVIRUS, NAA (HOSPITAL ORDER, SEND-OUT TO REF LAB)    EKG None  Radiology Dg Chest Port 1 View  Result Date: 06/15/2019 CLINICAL DATA:  Cough EXAM: PORTABLE CHEST 1 VIEW COMPARISON:  May 14, 2019 FINDINGS: The heart size remains enlarged. There is mild vascular congestion. No pneumothorax. There may be trace bilateral pleural effusions. There is no acute osseous abnormality. IMPRESSION: Cardiomegaly with mild vascular congestion. Electronically Signed   By: Constance Holster M.D.   On: 06/15/2019 19:56    Procedures Procedures (including critical care time)  Medications Ordered in ED Medications - No data to display   Initial Impression / Assessment and Plan / ED Course  I have reviewed the triage vital signs and the nursing notes.  Pertinent labs & imaging results that were available during my care of the patient were reviewed by me and considered in my medical decision making (see chart for details).    48 year old female presenting to the ED today requesting covid 19 testing. She reports she has had a cough for about 1 week and is concerned. Pt also reports a rash to her left forearm that she noticed a week ago as well. No  known exposure to covid 19 positive patients. Will obtain xray in the ED today and send out [facility] covid test. Pt will be advised to stay at home for 2-3 days until she receives test results.   Chest x ray with mild vascular congestion; pt reports she has been out of her Torsemide for the past couple of days. No shortness of breath. Satting 100% on RA. Speaking in full sentences without accessory muscle usage. She has an appointment scheduled with her PCP on 07/13. Will prescribe 1 weeks worth for her today and encouraged follow up with PCP.       Final Clinical Impressions(s) / ED Diagnoses   Final diagnoses:  Cough    ED Discharge Orders         Ordered    torsemide (DEMADEX) 10 MG tablet  Daily    Note to Pharmacy: DX Code Needed  .   06/15/19 [place], [place], PA-C 06/16/19 0138    Hayden Rasmussen, MD 06/16/19 [phone]

## 2019-06-15 NOTE — Discharge Instructions (Addendum)
You were seen in the ED today for a cough; we have tested you for covid 19. This test can take 2-3 days to return. You will need to stay home until you hear about your results. I have prescribed you a weeks worth of your fluid medication; it is important that you take this everyday. Your primary care doctor will need to prescribe this to you for the long term.

## 2019-06-15 NOTE — ED Triage Notes (Signed)
Pt states she has a rash and a cough "every now and then". Pt would like COVID testing. Denies any + contacts .

## 2019-06-17 LAB — NOVEL CORONAVIRUS, NAA (HOSP ORDER, SEND-OUT TO REF LAB; TAT 18-24 HRS): SARS-CoV-2, NAA: NOT DETECTED

## 2019-06-21 ENCOUNTER — Other Ambulatory Visit: Payer: Self-pay

## 2019-06-21 ENCOUNTER — Encounter: Payer: Self-pay | Admitting: Emergency Medicine

## 2019-06-21 ENCOUNTER — Ambulatory Visit (INDEPENDENT_AMBULATORY_CARE_PROVIDER_SITE_OTHER): Payer: Medicare HMO | Admitting: Emergency Medicine

## 2019-06-21 VITALS — BP 94/64 | HR 80 | Temp 99.3°F | Resp 16 | Wt 255.2 lb

## 2019-06-21 DIAGNOSIS — E119 Type 2 diabetes mellitus without complications: Secondary | ICD-10-CM | POA: Diagnosis not present

## 2019-06-21 DIAGNOSIS — R600 Localized edema: Secondary | ICD-10-CM

## 2019-06-21 DIAGNOSIS — L989 Disorder of the skin and subcutaneous tissue, unspecified: Secondary | ICD-10-CM

## 2019-06-21 DIAGNOSIS — I10 Essential (primary) hypertension: Secondary | ICD-10-CM | POA: Diagnosis not present

## 2019-06-21 DIAGNOSIS — E559 Vitamin D deficiency, unspecified: Secondary | ICD-10-CM | POA: Diagnosis not present

## 2019-06-21 MED ORDER — TORSEMIDE 10 MG PO TABS: 10.0000 mg | ORAL_TABLET | Freq: Every day | ORAL | 1 refills | Status: DC

## 2019-06-21 NOTE — Progress Notes (Signed)
Lab Results  Component Value Date   HGBA1C 5.8 (A) 11/11/2018   [name] 48 y.o.   Chief Complaint  Patient presents with   Vitamin d    per patient wants it check to see if she needs medication    HISTORY OF PRESENT ILLNESS: This is a 47 y.o. female diabetic here for follow-up.  Also has a history of seizure disorder. Also requesting refill of Demadex which was started in 2015 for pedal edema.  Echocardiogram in the past has shown: Study Conclusions  - Left ventricle: Inferior and septal hypokinesis Poor image   quality no definity used. The cavity size was mildly dilated.   Wall thickness was increased in a pattern of mild LVH. Systolic   function was mildly to moderately reduced. The estimated ejection   fraction was in the range of 40% to 45%. Left ventricular   diastolic function parameters were normal. - Left atrium: The atrium was mildly dilated. - Atrial septum: No defect or patent foramen ovale was identified   Today she has no complaints but is inquiring about vitamin D levels. HPI   Prior to Admission medications   Medication Sig Start Date End Date Taking? Authorizing Provider  ARIPiprazole (ABILIFY) 5 MG tablet Take 1 tablet (5 mg total) by mouth daily. 05/21/19  Yes Plovsky, Berneta Sages, MD  atorvastatin (LIPITOR) 10 MG tablet Take 1 tablet (10 mg total) by mouth daily at 6 PM. 05/12/19 08/10/19 Yes Adrian Specht, Ines Bloomer, MD  carbamazepine (TEGRETOL) 200 MG tablet 1  bid 05/21/19  Yes Plovsky, Berneta Sages, MD  carvedilol (COREG) 3.125 MG tablet TAKE 1 TABLET (3.125 MG TOTAL) BY MOUTH DAILY. 05/18/19  Yes Alichia Alridge, Ines Bloomer, MD  DULoxetine (CYMBALTA) 30 MG capsule Take 1 capsule (30 mg total) by mouth daily. 03/17/19  Yes Plovsky, Berneta Sages, MD  JANUVIA 50 MG tablet TAKE 1 TABLET (50 MG TOTAL) BY MOUTH DAILY. SCHEDULE APPOINTMENT FOR ADDITIONAL REFILLS. 05/17/19  Yes Jovaughn Wojtaszek, Ines Bloomer, MD  lacosamide (VIMPAT) 200 MG TABS tablet Take 1 tablet (200 mg total) by mouth 2  (two) times daily. 05/10/19  Yes [name], MD  naproxen (NAPROSYN) 375 MG tablet Take 1 tablet (375 mg total) by mouth 2 (two) times daily. 05/03/19  Yes Wieters, Hallie C, PA-C  predniSONE (DELTASONE) 20 MG tablet Take 1 tablet (20 mg total) by mouth daily. 05/11/19  Yes Hall-Potvin, [place], PA-C  torsemide (DEMADEX) 10 MG tablet Take 1 tablet (10 mg total) by mouth daily for 7 days. 06/15/19 06/22/19 Yes Venter, Margaux, PA-C  Blood Glucose Monitoring Suppl KIT Please supply kit covered by insurance and supplies for testing four times daily. E11.9 E10.9 05/12/19   Horald Pollen, MD  Blood Pressure Monitor KIT Take blood pressure reading twice a day for the next 2 weeks and then once a day. 05/12/19   Horald Pollen, MD  clotrimazole (LOTRIMIN) 1 % cream Apply to affected area 2 times daily Patient not taking: Reported on 06/21/2019 05/03/19   Wieters, Hallie C, PA-C  nystatin (MYCOSTATIN) 100000 UNIT/ML suspension Take 5 mLs (500,000 Units total) by mouth 4 (four) times daily. Patient not taking: Reported on 06/21/2019 05/11/19   Hall-Potvin, [place], PA-C    Allergies  Allergen Reactions   Amoxicillin Itching    Has patient had a PCN reaction causing immediate rash, facial/tongue/throat swelling, SOB or lightheadedness with hypotension: yes Has patient had a PCN reaction causing severe rash involving mucus membranes or skin necrosis: no Has patient had a  PCN reaction that required hospitalization: no Has patient had a PCN reaction occurring within the last 10 years: yes If all of the above answers are "NO", then may proceed with Cephalosporin use.   Lisinopril Swelling    Angioedema    Hydrocodone Other (See Comments)    Depressed    Tegretol [Carbamazepine] Swelling    Throat swells    Patient Active Problem List   Diagnosis Date Noted   Bipolar disorder ([place]) 05/13/2019   Bipolar 1 disorder ([place]) 05/13/2019   Adjustment disorder with anxiety    History of  recent stroke 08/06/2017   OSA (obstructive sleep apnea)    Cryptogenic stroke ([place]) 05/20/2017   Controlled type 2 diabetes mellitus without complication, without long-term current use of insulin ([place]) 05/08/2017   Essential hypertension 07/27/2008   Seizure disorder ([place]) 02/05/2007    Past Medical History:  Diagnosis Date   Anemia    Anxiety    Bipolar 1 disorder ([place])    Common migraine 05/19/2015   Depression    Diabetes mellitus, type II ([place])    Hypertension    Irritable bowel syndrome (IBS)    Mild mental retardation    Obesity    Partial complex seizure disorder with intractable epilepsy ([place]) 05/12/2014   Seizures ([place])    intractable, sz 08/23/17   Sleep apnea    Stroke ([place])    Type II or unspecified type diabetes mellitus without mention of complication, not stated as uncontrolled     Past Surgical History:  Procedure Laterality Date   COLONOSCOPY     2012-normal , Dr Sharlett Iles   ESOPHAGOGASTRODUODENOSCOPY     normal-Dr Patterson 2012   LOOP RECORDER INSERTION N/A 05/30/2017   Procedure: Loop Recorder Insertion;  Surgeon: Constance Haw, MD;  Location: [facility];  Service: Cardiovascular;  Laterality: N/A;   LOOP RECORDER REMOVAL N/A 03/04/2018   Procedure: LOOP RECORDER REMOVAL;  Surgeon: Constance Haw, MD;  Location: [facility];  Service: Cardiovascular;  Laterality: N/A;   MYRINGOTOMY WITH TUBE PLACEMENT     MYRINGOTOMY WITH TUBE PLACEMENT Right 11/05/2017   Procedure: MYRINGOTOMY WITH TUBE PLACEMENT;  Surgeon: Melissa Montane, MD;  Location: [facility];  Service: ENT;  Laterality: Right;  right T Tube placement   NASAL SINUS SURGERY     TEE WITHOUT CARDIOVERSION N/A 05/30/2017   Procedure: TRANSESOPHAGEAL ECHOCARDIOGRAM (TEE);  Surgeon: Acie Fredrickson, Wonda Cheng, MD;  Location[facility];  Service: Cardiovascular;  Laterality: N/A;    Social History   Socioeconomic History   Marital status: Single    Spouse name:  Brandi Gomez   Number of children: 3   Years of education: 12   Highest education level: Not on file  Occupational History   Occupation: disabled    Fish farm manager: DISABLED  Social Designer, fashion/clothing strain: Not on file   Food insecurity    Worry: Not on file    Inability: Not on file   Transportation needs    Medical: Not on file    Non-medical: Not on file  Tobacco Use   Smoking status: Never Smoker   Smokeless tobacco: Never Used  Substance and Sexual Activity   Alcohol use: No   Drug use: No   Sexual activity: Never  Lifestyle   Physical activity    Days per week: Not on file    Minutes per session: Not on file   Stress: Not on file  Relationships   Social connections  Talks on phone: Not on file    Gets together: Not on file    Attends religious service: Not on file    Active member of club or organization: Not on file    Attends meetings of clubs or organizations: Not on file    Relationship status: Not on file   Intimate partner violence    Fear of current or ex partner: Not on file    Emotionally abused: Not on file    Physically abused: Not on file    Forced sexual activity: Not on file  Other Topics Concern   Not on file  Social History Narrative   Patient lives at home with daughter.    Patient has 3 children.    Patient is right handed.    Patient has a high school education.    Patient is on disability   Patient drinks 2 cups of caffeine daily.    Family History  Problem Relation Age of Onset   Diabetes Mother        passed away from accidental death   Hypertension Mother    Bipolar disorder Father    Diabetes Daughter    Leukemia Daughter    Ovarian cancer Maternal Aunt      Review of Systems  Constitutional: Negative.  Negative for chills and fever.  HENT: Negative.  Negative for congestion, nosebleeds and sore throat.   Eyes: Negative.   Respiratory: Negative.  Negative for cough and shortness of breath.     Cardiovascular: Negative.  Negative for chest pain and palpitations.  Gastrointestinal: Negative.  Negative for abdominal pain, diarrhea, nausea and vomiting.  Genitourinary: Negative.  Negative for dysuria and hematuria.  Musculoskeletal: Negative.   Skin:       Skin lesions and notes on arms  Neurological: Positive for seizures. Negative for dizziness and headaches.  Endo/Heme/Allergies: Negative.    Vitals:   06/21/19 1458  BP: 94/64  Pulse: 80  Resp: 16  Temp: 99.3 F (37.4 C)  SpO2: 98%     Physical Exam Vitals signs reviewed.  Constitutional:      Appearance: Normal appearance. She is obese.  HENT:     Head: Normocephalic and atraumatic.     Nose:     Comments: Positive hyperpigmented skin lesion Eyes:     Extraocular Movements: Extraocular movements intact.     Pupils: Pupils are equal, round, and reactive to light.  Neck:     Musculoskeletal: Normal range of motion and neck supple.  Cardiovascular:     Rate and Rhythm: Normal rate and regular rhythm.     Heart sounds: Normal heart sounds.  Pulmonary:     Effort: Pulmonary effort is normal.     Breath sounds: Normal breath sounds.  Abdominal:     Palpations: Abdomen is soft.     Tenderness: There is no abdominal tenderness.  Musculoskeletal: Normal range of motion.     Right lower leg: Edema (+2) present.     Left lower leg: Edema (+2) present.  Skin:    General: Skin is warm and dry.     Capillary Refill: Capillary refill takes less than 2 seconds.  Neurological:     General: No focal deficit present.     Mental Status: She is alert and oriented to person, place, and time.  Psychiatric:        Mood and Affect: Mood normal.        Behavior: Behavior normal.      ASSESSMENT &  PLAN: Brandi Gomez was seen today for vitamin d.  Diagnoses and all orders for this visit:  Controlled type 2 diabetes mellitus without complication, without long-term current use of insulin (HCC) -     Hemoglobin  A1c  Essential hypertension -     torsemide (DEMADEX) 10 MG tablet; Take 1 tablet (10 mg total) by mouth daily. -     VITAMIN D 25 Hydroxy (Vit-D Deficiency, Fractures) -     CBC with Differential/Platelet -     Comprehensive metabolic panel  Lesion of skin of nose -     Ambulatory referral to Dermatology  Bilateral leg edema    Patient Instructions       If you have lab work done today you will be contacted with your lab results within the next 2 weeks.  If you have not heard from [place] then please contact us. The fastest way to get your results is to register for My Chart.   IF you received an x-ray today, you will receive an invoice from [facility]. Please contact [facility] at [phone] with questions or concerns regarding your invoice.   IF you received labwork today, you will receive an invoice from [facility]. Please contact [facility] at [phone] with questions or concerns regarding your invoice.   Our billing staff will not be able to assist you with questions regarding bills from these companies.  You will be contacted with the lab results as soon as they are available. The fastest way to get your results is to activate your My Chart account. Instructions are located on the last page of this paperwork. If you have not heard from [place] regarding the results in 2 weeks, please contact this office.     Health Maintenance, Female Adopting a healthy lifestyle and getting preventive care are important in promoting health and wellness. Ask your health care provider about:  The right schedule for you to have regular tests and exams.  Things you can do on your own to prevent diseases and keep yourself healthy. What should I know about diet, weight, and exercise? Eat a healthy diet   Eat a diet that includes plenty of vegetables, fruits, low-fat dairy products, and lean protein.  Do not eat a lot of foods that are high in solid fats, added sugars, or  sodium. Maintain a healthy weight Body mass index (BMI) is used to identify weight problems. It estimates body fat based on height and weight. Your health care provider can help determine your BMI and help you achieve or maintain a healthy weight. Get regular exercise Get regular exercise. This is one of the most important things you can do for your health. Most adults should:  Exercise for at least 150 minutes each week. The exercise should increase your heart rate and make you sweat (moderate-intensity exercise).  Do strengthening exercises at least twice a week. This is in addition to the moderate-intensity exercise.  Spend less time sitting. Even light physical activity can be beneficial. Watch cholesterol and blood lipids Have your blood tested for lipids and cholesterol at 50 years of age, then have this test every 5 years. Have your cholesterol levels checked more often if:  Your lipid or cholesterol levels are high.  You are older than 50 years of age.  You are at high risk for heart disease. What should I know about cancer screening? Depending on your health history and family history, you may need to have cancer screening at various ages. This may include  screening for:  Breast cancer.  Cervical cancer.  Colorectal cancer.  Skin cancer.  Lung cancer. What should I know about heart disease, diabetes, and high blood pressure? Blood pressure and heart disease  High blood pressure causes heart disease and increases the risk of stroke. This is more likely to develop in people who have high blood pressure readings, are of African descent, or are overweight.  Have your blood pressure checked: ? Every 3-5 years if you are 34-39 years of age. ? Every year if you are 11 years old or older. Diabetes Have regular diabetes screenings. This checks your fasting blood sugar level. Have the screening done:  Once every three years after age 72 if you are at a normal weight and have  a low risk for diabetes.  More often and at a younger age if you are overweight or have a high risk for diabetes. What should I know about preventing infection? Hepatitis B If you have a higher risk for hepatitis B, you should be screened for this virus. Talk with your health care provider to find out if you are at risk for hepatitis B infection. Hepatitis C Testing is recommended for:  Everyone born from [birth year] through 1967.  Anyone with known risk factors for hepatitis C. Sexually transmitted infections (STIs)  Get screened for STIs, including gonorrhea and chlamydia, if: ? You are sexually active and are younger than 50 years of age. ? You are older than 50 years of age and your health care provider tells you that you are at risk for this type of infection. ? Your sexual activity has changed since you were last screened, and you are at increased risk for chlamydia or gonorrhea. Ask your health care provider if you are at risk.  Ask your health care provider about whether you are at high risk for HIV. Your health care provider may recommend a prescription medicine to help prevent HIV infection. If you choose to take medicine to prevent HIV, you should first get tested for HIV. You should then be tested every 3 months for as long as you are taking the medicine. Pregnancy  If you are about to stop having your period (premenopausal) and you may become pregnant, seek counseling before you get pregnant.  Take 400 to 800 micrograms (mcg) of folic acid every day if you become pregnant.  Ask for birth control (contraception) if you want to prevent pregnancy. Osteoporosis and menopause Osteoporosis is a disease in which the bones lose minerals and strength with aging. This can result in bone fractures. If you are 107 years old or older, or if you are at risk for osteoporosis and fractures, ask your health care provider if you should:  Be screened for bone loss.  Take a calcium or vitamin D  supplement to lower your risk of fractures.  Be given hormone replacement therapy (HRT) to treat symptoms of menopause. Follow these instructions at home: Lifestyle  Do not use any products that contain nicotine or tobacco, such as cigarettes, e-cigarettes, and chewing tobacco. If you need help quitting, ask your health care provider.  Do not use street drugs.  Do not share needles.  Ask your health care provider for help if you need support or information about quitting drugs. Alcohol use  Do not drink alcohol if: ? Your health care provider tells you not to drink. ? You are pregnant, may be pregnant, or are planning to become pregnant.  If you drink alcohol: ? Limit how much  you use to 0-1 drink a day. ? Limit intake if you are breastfeeding.  Be aware of how much alcohol is in your drink. In the [place] one drink equals one 12 oz bottle of beer (355 mL), one 5 oz glass of wine (148 mL), or one 1 oz glass of hard liquor (44 mL). General instructions  Schedule regular health, dental, and eye exams.  Stay current with your vaccines.  Tell your health care provider if: ? You often feel depressed. ? You have ever been abused or do not feel safe at home. Summary  Adopting a healthy lifestyle and getting preventive care are important in promoting health and wellness.  Follow your health care provider's instructions about healthy diet, exercising, and getting tested or screened for diseases.  Follow your health care provider's instructions on monitoring your cholesterol and blood pressure. This information is not intended to replace advice given to you by your health care provider. Make sure you discuss any questions you have with your health care provider. Document Released: 06/10/2011 Document Revised: 11/18/2018 Document Reviewed: 11/18/2018 Elsevier Patient Education  2020 [employer]      Agustina Caroli, MD [facility]

## 2019-06-21 NOTE — Patient Instructions (Addendum)
   If you have lab work done today you will be contacted with your lab results within the next 2 weeks.  If you have not heard from us then please contact us. The fastest way to get your results is to register for My Chart.   IF you received an x-ray today, you will receive an invoice from [facility]. Please contact [facility] at [phone] with questions or concerns regarding your invoice.   IF you received labwork today, you will receive an invoice from [facility]. Please contact [facility] at [phone] with questions or concerns regarding your invoice.   Our billing staff will not be able to assist you with questions regarding bills from these companies.  You will be contacted with the lab results as soon as they are available. The fastest way to get your results is to activate your My Chart account. Instructions are located on the last page of this paperwork. If you have not heard from us regarding the results in 2 weeks, please contact this office.      Health Maintenance, Female Adopting a healthy lifestyle and getting preventive care are important in promoting health and wellness. Ask your health care provider about:  The right schedule for you to have regular tests and exams.  Things you can do on your own to prevent diseases and keep yourself healthy. What should I know about diet, weight, and exercise? Eat a healthy diet   Eat a diet that includes plenty of vegetables, fruits, low-fat dairy products, and lean protein.  Do not eat a lot of foods that are high in solid fats, added sugars, or sodium. Maintain a healthy weight Body mass index (BMI) is used to identify weight problems. It estimates body fat based on height and weight. Your health care provider can help determine your BMI and help you achieve or maintain a healthy weight. Get regular exercise Get regular exercise. This is one of the most important things you can do for your health. Most  adults should:  Exercise for at least 150 minutes each week. The exercise should increase your heart rate and make you sweat (moderate-intensity exercise).  Do strengthening exercises at least twice a week. This is in addition to the moderate-intensity exercise.  Spend less time sitting. Even light physical activity can be beneficial. Watch cholesterol and blood lipids Have your blood tested for lipids and cholesterol at 50 years of age, then have this test every 5 years. Have your cholesterol levels checked more often if:  Your lipid or cholesterol levels are high.  You are older than 50 years of age.  You are at high risk for heart disease. What should I know about cancer screening? Depending on your health history and family history, you may need to have cancer screening at various ages. This may include screening for:  Breast cancer.  Cervical cancer.  Colorectal cancer.  Skin cancer.  Lung cancer. What should I know about heart disease, diabetes, and high blood pressure? Blood pressure and heart disease  High blood pressure causes heart disease and increases the risk of stroke. This is more likely to develop in people who have high blood pressure readings, are of African descent, or are overweight.  Have your blood pressure checked: ? Every 3-5 years if you are 18-39 years of age. ? Every year if you are 40 years old or older. Diabetes Have regular diabetes screenings. This checks your fasting blood sugar level. Have the screening done:  Once every   three years after age 40 if you are at a normal weight and have a low risk for diabetes.  More often and at a younger age if you are overweight or have a high risk for diabetes. What should I know about preventing infection? Hepatitis B If you have a higher risk for hepatitis B, you should be screened for this virus. Talk with your health care provider to find out if you are at risk for hepatitis B infection. Hepatitis  C Testing is recommended for:  Everyone born from 1945 through 1965.  Anyone with known risk factors for hepatitis C. Sexually transmitted infections (STIs)  Get screened for STIs, including gonorrhea and chlamydia, if: ? You are sexually active and are younger than 50 years of age. ? You are older than 50 years of age and your health care provider tells you that you are at risk for this type of infection. ? Your sexual activity has changed since you were last screened, and you are at increased risk for chlamydia or gonorrhea. Ask your health care provider if you are at risk.  Ask your health care provider about whether you are at high risk for HIV. Your health care provider may recommend a prescription medicine to help prevent HIV infection. If you choose to take medicine to prevent HIV, you should first get tested for HIV. You should then be tested every 3 months for as long as you are taking the medicine. Pregnancy  If you are about to stop having your period (premenopausal) and you may become pregnant, seek counseling before you get pregnant.  Take 400 to 800 micrograms (mcg) of folic acid every day if you become pregnant.  Ask for birth control (contraception) if you want to prevent pregnancy. Osteoporosis and menopause Osteoporosis is a disease in which the bones lose minerals and strength with aging. This can result in bone fractures. If you are 65 years old or older, or if you are at risk for osteoporosis and fractures, ask your health care provider if you should:  Be screened for bone loss.  Take a calcium or vitamin D supplement to lower your risk of fractures.  Be given hormone replacement therapy (HRT) to treat symptoms of menopause. Follow these instructions at home: Lifestyle  Do not use any products that contain nicotine or tobacco, such as cigarettes, e-cigarettes, and chewing tobacco. If you need help quitting, ask your health care provider.  Do not use street  drugs.  Do not share needles.  Ask your health care provider for help if you need support or information about quitting drugs. Alcohol use  Do not drink alcohol if: ? Your health care provider tells you not to drink. ? You are pregnant, may be pregnant, or are planning to become pregnant.  If you drink alcohol: ? Limit how much you use to 0-1 drink a day. ? Limit intake if you are breastfeeding.  Be aware of how much alcohol is in your drink. In the [place] one drink equals one 12 oz bottle of beer (355 mL), one 5 oz glass of wine (148 mL), or one 1 oz glass of hard liquor (44 mL). General instructions  Schedule regular health, dental, and eye exams.  Stay current with your vaccines.  Tell your health care provider if: ? You often feel depressed. ? You have ever been abused or do not feel safe at home. Summary  Adopting a healthy lifestyle and getting preventive care are important in promoting health and wellness.    Follow your health care provider's instructions about healthy diet, exercising, and getting tested or screened for diseases.  Follow your health care provider's instructions on monitoring your cholesterol and blood pressure. This information is not intended to replace advice given to you by your health care provider. Make sure you discuss any questions you have with your health care provider. Document Released: 06/10/2011 Document Revised: 11/18/2018 Document Reviewed: 11/18/2018 Elsevier Patient Education  2020 Elsevier Inc.  

## 2019-06-22 LAB — COMPREHENSIVE METABOLIC PANEL
ALT: 11 IU/L (ref 0–32)
AST: 15 IU/L (ref 0–40)
Albumin/Globulin Ratio: 1.1 — ABNORMAL LOW (ref 1.2–2.2)
Albumin: 4 g/dL (ref 3.8–4.8)
Alkaline Phosphatase: 83 IU/L (ref 39–117)
BUN/Creatinine Ratio: 15 (ref 9–23)
BUN: 22 mg/dL (ref 6–24)
Bilirubin Total: 0.3 mg/dL (ref 0.0–1.2)
CO2: 25 mmol/L (ref 20–29)
Calcium: 9.3 mg/dL (ref 8.7–10.2)
Chloride: 98 mmol/L (ref 96–106)
Creatinine, Ser: 1.46 mg/dL — ABNORMAL HIGH (ref 0.57–1.00)
GFR calc Af Amer: 48 mL/min/{1.73_m2} — ABNORMAL LOW (ref >59)
GFR calc non Af Amer: 42 mL/min/{1.73_m2} — ABNORMAL LOW (ref >59)
Globulin, Total: 3.5 g/dL (ref 1.5–4.5)
Glucose: 166 mg/dL — ABNORMAL HIGH (ref 65–99)
Potassium: 4 mmol/L (ref 3.5–5.2)
Sodium: 142 mmol/L (ref 134–144)
Total Protein: 7.5 g/dL (ref 6.0–8.5)

## 2019-06-22 LAB — CBC WITH DIFFERENTIAL/PLATELET
Basophils Absolute: 0 10*3/uL (ref 0.0–0.2)
Basos: 0 %
EOS (ABSOLUTE): 0.1 10*3/uL (ref 0.0–0.4)
Eos: 1 %
Hematocrit: 33.6 % — ABNORMAL LOW (ref 34.0–46.6)
Hemoglobin: 11.1 g/dL (ref 11.1–15.9)
Immature Grans (Abs): 0 10*3/uL (ref 0.0–0.1)
Immature Granulocytes: 0 %
Lymphocytes Absolute: 2 10*3/uL (ref 0.7–3.1)
Lymphs: 26 %
MCH: 30.1 pg (ref 26.6–33.0)
MCHC: 33 g/dL (ref 31.5–35.7)
MCV: 91 fL (ref 79–97)
Monocytes Absolute: 0.4 10*3/uL (ref 0.1–0.9)
Monocytes: 5 %
Neutrophils Absolute: 5.1 10*3/uL (ref 1.4–7.0)
Neutrophils: 68 %
Platelets: 283 10*3/uL (ref 150–450)
RBC: 3.69 x10E6/uL — ABNORMAL LOW (ref 3.77–5.28)
RDW: 13.6 % (ref 11.7–15.4)
WBC: 7.6 10*3/uL (ref 3.4–10.8)

## 2019-06-22 LAB — VITAMIN D 25 HYDROXY (VIT D DEFICIENCY, FRACTURES): Vit D, 25-Hydroxy: 17.7 ng/mL — ABNORMAL LOW (ref 30.0–100.0)

## 2019-06-22 LAB — HEMOGLOBIN A1C
Est. average glucose Bld gHb Est-mCnc: 128 mg/dL
Hgb A1c MFr Bld: 6.1 % — ABNORMAL HIGH (ref 4.8–5.6)

## 2019-06-23 ENCOUNTER — Other Ambulatory Visit: Payer: Self-pay | Admitting: Emergency Medicine

## 2019-06-23 MED ORDER — VITAMIN D (ERGOCALCIFEROL) 1.25 MG (50000 UNIT) PO CAPS: 50000.0000 [IU] | ORAL_CAPSULE | ORAL | 0 refills | Status: DC

## 2019-06-24 ENCOUNTER — Telehealth (HOSPITAL_COMMUNITY): Payer: Self-pay

## 2019-06-24 NOTE — Telephone Encounter (Signed)
Patients daughter called, patient was with her and gave me verbal permission to speak with her, she needs a note from [place] because she is taking some time off of work to take care of the patient. Please review and advise, thank you

## 2019-06-25 ENCOUNTER — Encounter (HOSPITAL_COMMUNITY): Payer: Self-pay

## 2019-06-25 ENCOUNTER — Telehealth: Payer: Self-pay | Admitting: Neurology

## 2019-06-25 NOTE — Telephone Encounter (Addendum)
Pt is aware of her upcoming appointment with NP but she is asking the message be relayed to Dr Jannifer Franklin that she has increase memory loss and confusion.  Pt would like to know what Dr Jannifer Franklin would suggest    I called the patient.  The patient is reporting some problems with slowing of cognitive processing.  She indicates that when she does not take her medication she feels better.  She may be having cognitive side effects from carbamazepine or Vimpat or both.  I suggested considering switching off to another medication such as Lamictal, the patient does not wish to consider this at this time, she will be evaluated in our office in early August.

## 2019-06-28 NOTE — Telephone Encounter (Signed)
I called the patient.  The patient is reporting some cognitive side effects, she is on carbamazepine and Vimpat, these medications could potentially make her feel slow cognitively.  She claims that if she misses her doses she feels better.  She however does not wish to consider switching over to another medication

## 2019-06-30 ENCOUNTER — Emergency Department (HOSPITAL_COMMUNITY)
Admission: EM | Admit: 2019-06-30 | Discharge: 2019-06-30 | Disposition: A | Discharge status: Home / Self Care | Payer: Medicare HMO | Attending: Emergency Medicine | Admitting: Emergency Medicine

## 2019-06-30 ENCOUNTER — Emergency Department (HOSPITAL_COMMUNITY): Payer: Medicare HMO

## 2019-06-30 ENCOUNTER — Other Ambulatory Visit: Payer: Self-pay

## 2019-06-30 ENCOUNTER — Encounter (HOSPITAL_COMMUNITY): Payer: Self-pay

## 2019-06-30 DIAGNOSIS — G40909 Epilepsy, unspecified, not intractable, without status epilepticus: Secondary | ICD-10-CM | POA: Diagnosis not present

## 2019-06-30 DIAGNOSIS — G9340 Encephalopathy, unspecified: Secondary | ICD-10-CM | POA: Diagnosis not present

## 2019-06-30 DIAGNOSIS — Z8673 Personal history of transient ischemic attack (TIA), and cerebral infarction without residual deficits: Secondary | ICD-10-CM | POA: Diagnosis not present

## 2019-06-30 DIAGNOSIS — I10 Essential (primary) hypertension: Secondary | ICD-10-CM | POA: Insufficient documentation

## 2019-06-30 DIAGNOSIS — F319 Bipolar disorder, unspecified: Secondary | ICD-10-CM | POA: Insufficient documentation

## 2019-06-30 DIAGNOSIS — Z7984 Long term (current) use of oral hypoglycemic drugs: Secondary | ICD-10-CM | POA: Insufficient documentation

## 2019-06-30 DIAGNOSIS — Z9119 Patient's noncompliance with other medical treatment and regimen: Secondary | ICD-10-CM | POA: Insufficient documentation

## 2019-06-30 DIAGNOSIS — F70 Mild intellectual disabilities: Secondary | ICD-10-CM | POA: Insufficient documentation

## 2019-06-30 DIAGNOSIS — E119 Type 2 diabetes mellitus without complications: Secondary | ICD-10-CM | POA: Diagnosis not present

## 2019-06-30 DIAGNOSIS — R569 Unspecified convulsions: Secondary | ICD-10-CM | POA: Diagnosis not present

## 2019-06-30 DIAGNOSIS — R0902 Hypoxemia: Secondary | ICD-10-CM | POA: Diagnosis not present

## 2019-06-30 DIAGNOSIS — I959 Hypotension, unspecified: Secondary | ICD-10-CM | POA: Diagnosis not present

## 2019-06-30 LAB — CBC WITH DIFFERENTIAL/PLATELET
Abs Immature Granulocytes: 0.04 10*3/uL (ref 0.00–0.07)
Basophils Absolute: 0 10*3/uL (ref 0.0–0.1)
Basophils Relative: 0 %
Eosinophils Absolute: 0.1 10*3/uL (ref 0.0–0.5)
Eosinophils Relative: 2 %
HCT: 31.8 % — ABNORMAL LOW (ref 36.0–46.0)
Hemoglobin: 9.7 g/dL — ABNORMAL LOW (ref 12.0–15.0)
Immature Granulocytes: 1 %
Lymphocytes Relative: 25 %
Lymphs Abs: 1.5 10*3/uL (ref 0.7–4.0)
MCH: 29.4 pg (ref 26.0–34.0)
MCHC: 30.5 g/dL (ref 30.0–36.0)
MCV: 96.4 fL (ref 80.0–100.0)
Monocytes Absolute: 0.2 10*3/uL (ref 0.1–1.0)
Monocytes Relative: 3 %
Neutro Abs: 4.2 10*3/uL (ref 1.7–7.7)
Neutrophils Relative %: 69 %
Platelets: UNDETERMINED 10*3/uL (ref 150–400)
RBC: 3.3 MIL/uL — ABNORMAL LOW (ref 3.87–5.11)
RDW: 13.9 % (ref 11.5–15.5)
WBC: 6 10*3/uL (ref 4.0–10.5)
nRBC: 0 % (ref 0.0–0.2)

## 2019-06-30 LAB — URINALYSIS, ROUTINE W REFLEX MICROSCOPIC
Bilirubin Urine: NEGATIVE
Glucose, UA: NEGATIVE mg/dL
Hgb urine dipstick: NEGATIVE
Ketones, ur: NEGATIVE mg/dL
Leukocytes,Ua: NEGATIVE
Nitrite: NEGATIVE
Protein, ur: NEGATIVE mg/dL
Specific Gravity, Urine: 1.016 (ref 1.005–1.030)
pH: 5 (ref 5.0–8.0)

## 2019-06-30 LAB — COMPREHENSIVE METABOLIC PANEL
ALT: 14 U/L (ref 0–44)
AST: 16 U/L (ref 15–41)
Albumin: 3.2 g/dL — ABNORMAL LOW (ref 3.5–5.0)
Alkaline Phosphatase: 59 U/L (ref 38–126)
Anion gap: 9 (ref 5–15)
BUN: 21 mg/dL — ABNORMAL HIGH (ref 6–20)
CO2: 22 mmol/L (ref 22–32)
Calcium: 8.1 mg/dL — ABNORMAL LOW (ref 8.9–10.3)
Chloride: 105 mmol/L (ref 98–111)
Creatinine, Ser: 1.16 mg/dL — ABNORMAL HIGH (ref 0.44–1.00)
GFR calc Af Amer: >60 mL/min (ref >60)
GFR calc non Af Amer: 55 mL/min — ABNORMAL LOW (ref >60)
Glucose, Bld: 148 mg/dL — ABNORMAL HIGH (ref 70–99)
Potassium: 3.9 mmol/L (ref 3.5–5.1)
Sodium: 136 mmol/L (ref 135–145)
Total Bilirubin: 0.3 mg/dL (ref 0.3–1.2)
Total Protein: 6.8 g/dL (ref 6.5–8.1)

## 2019-06-30 LAB — I-STAT BETA HCG BLOOD, ED (MC, WL, AP ONLY): I-stat hCG, quantitative: <5 m[IU]/mL (ref <5)

## 2019-06-30 LAB — CARBAMAZEPINE LEVEL, TOTAL: Carbamazepine Lvl: <2 ug/mL — ABNORMAL LOW (ref 4.0–12.0)

## 2019-06-30 MED ORDER — LACOSAMIDE 50 MG PO TABS
200.0000 mg | ORAL_TABLET | Freq: Two times a day (BID) | ORAL | Status: DC
  Administered 2019-06-30: 200 mg via ORAL
  Filled 2019-06-30: qty 4

## 2019-06-30 MED ORDER — VITAMIN D (ERGOCALCIFEROL) 1.25 MG (50000 UNIT) PO CAPS
50000.0000 [IU] | ORAL_CAPSULE | ORAL | Status: DC
  Administered 2019-06-30: 11:00:00 50000 [IU] via ORAL
  Filled 2019-06-30: qty 1

## 2019-06-30 MED ORDER — LACOSAMIDE 50 MG PO TABS
200.0000 mg | ORAL_TABLET | Freq: Once | ORAL | Status: AC
  Administered 2019-06-30: 200 mg via ORAL
  Filled 2019-06-30: qty 4

## 2019-06-30 MED ORDER — LINAGLIPTIN 5 MG PO TABS
5.0000 mg | ORAL_TABLET | Freq: Every day | ORAL | Status: DC
  Administered 2019-06-30: 5 mg via ORAL
  Filled 2019-06-30: qty 1

## 2019-06-30 MED ORDER — LORAZEPAM 1 MG PO TABS: 1.0000 mg | ORAL_TABLET | ORAL | Status: DC | PRN

## 2019-06-30 MED ORDER — ZIPRASIDONE MESYLATE 20 MG IM SOLR: 20.0000 mg | INTRAMUSCULAR | Status: DC | PRN

## 2019-06-30 MED ORDER — LAMOTRIGINE 25 MG PO TABS: ORAL_TABLET | ORAL | 2 refills | Status: DC

## 2019-06-30 MED ORDER — CARBAMAZEPINE 200 MG PO TABS: 200.0000 mg | ORAL_TABLET | Freq: Two times a day (BID) | ORAL | Status: DC

## 2019-06-30 MED ORDER — CARVEDILOL 3.125 MG PO TABS
3.1250 mg | ORAL_TABLET | Freq: Every day | ORAL | Status: DC
  Administered 2019-06-30: 3.125 mg via ORAL
  Filled 2019-06-30: qty 1

## 2019-06-30 MED ORDER — RISPERIDONE 1 MG PO TBDP: 2.0000 mg | ORAL_TABLET | Freq: Three times a day (TID) | ORAL | Status: DC | PRN

## 2019-06-30 MED ORDER — CARBAMAZEPINE 200 MG PO TABS
300.0000 mg | ORAL_TABLET | Freq: Two times a day (BID) | ORAL | Status: DC
  Administered 2019-06-30: 11:00:00 300 mg via ORAL
  Filled 2019-06-30 (×2): qty 1.5

## 2019-06-30 MED ORDER — ONDANSETRON HCL 4 MG PO TABS: 4.0000 mg | ORAL_TABLET | Freq: Three times a day (TID) | ORAL | Status: DC | PRN

## 2019-06-30 MED ORDER — ACETAMINOPHEN 325 MG PO TABS: 650.0000 mg | ORAL_TABLET | ORAL | Status: DC | PRN

## 2019-06-30 MED ORDER — TORSEMIDE 10 MG PO TABS
10.0000 mg | ORAL_TABLET | Freq: Every day | ORAL | Status: DC
  Administered 2019-06-30: 10 mg via ORAL
  Filled 2019-06-30: qty 1

## 2019-06-30 NOTE — [facility] Suicide Risk Assessment (Cosign Needed)
Suicide Risk Assessment  Discharge Assessment   [facility] Discharge Suicide Risk Assessment   Principal Problem: <principal problem not specified> Discharge Diagnoses: Active Problems:   * No active hospital problems. *   Total Time spent with patient: 30 minutes  Musculoskeletal: Strength & Muscle Tone: within normal limits Gait & Station: normal Patient leans: N/A  Psychiatric Specialty Exam:   Blood pressure 133/76, pulse 69, temperature 99.1 F (37.3 C), resp. rate 16, height 5\' 1"  (1.549 m), weight 115 kg, SpO2 99 %.Body mass index is 47.9 kg/m.  General Appearance: Casual  Eye Contact::  Good  Speech:  Clear and Coherent and Normal Rate409  Volume:  Normal  Mood:  Appropriate  Affect:  Appropriate and Congruent  Thought Process:  Coherent and Goal Directed  Orientation:  Full (Time, Place, and Person)  Thought Content:  WDL and Denies hallucinations, delusions, and paranoia  Suicidal Thoughts:  No  Homicidal Thoughts:  No  Memory:  Immediate;   Good Recent;   Good  Judgement:  Intact  Insight:  Present  Psychomotor Activity:  Normal  Concentration:  Fair  Recall:  Good  Fund of Knowledge:Fair  Language: Good  Akathisia:  No  Handed:  Right  AIMS (if indicated):     Assets:  Communication Skills Desire for Improvement Housing Social Support  Sleep:     Cognition: WNL  ADL's:  Intact   Mental Status Per Nursing Assessment::   On Admission:    Brandi Gomez, 48 y.o., female patient seen via tele psych by this provider, Dr. Mariea Clonts; and chart reviewed on 06/30/19.  On evaluation Brandi Gomez reports she came to the hospital because she had a seizure and feels that it may be related to her medications.  Patient denies suicidal/self harm/homicidal ideation, psychosis, and paranoia.  Patient does state that she is having conflict with her 72 yr old daughter who doesn't listen to her.   During evaluation Brandi Gomez is alert/oriented x 4;  calm/cooperative; and mood is congruent with affect.  He/She does not appear to be responding to internal/external stimuli or delusional thoughts.  Patient denies suicidal/self-harm/homicidal ideation, psychosis, and paranoia.  Patient answered question appropriately.  Patient also informed to follow up with neurologist related to seizure.    Demographic Factors:  NA  Loss Factors: NA  Historical Factors: NA  Risk Reduction Factors:   Sense of responsibility to family, Religious beliefs about death, Living with another person, especially a relative and Positive social support  Continued Clinical Symptoms:  Previous Psychiatric Diagnoses and Treatments  Cognitive Features That Contribute To Risk:  None    Suicide Risk:  Minimal: No identifiable suicidal ideation.  Patients presenting with no risk factors but with morbid ruminations; may be classified as minimal risk based on the severity of the depressive symptoms    Plan Of Care/Follow-up recommendations:  Activity:  As tolerated Diet:  Heart healthy carb modified Other:  Follow up with current outpatient psychiatric provider   Disposition: No evidence of imminent risk to self or others at present.   Patient does not meet criteria for psychiatric inpatient admission. Supportive therapy provided about ongoing stressors. Discussed crisis plan, support from social network, calling 911, coming to the Emergency Department, and calling Suicide Hotline.  Brandi Colston, NP 06/30/2019, 1:24 PM

## 2019-06-30 NOTE — ED Notes (Signed)
Pt ambulated in hall with no assistance.

## 2019-06-30 NOTE — ED Notes (Signed)
Patient given discharge teaching and verbalized understanding. Patient ambulated out of ED with a steady gait. 

## 2019-06-30 NOTE — ED Notes (Signed)
Waiting for medication to be sent down from pharamacy for medication administration.

## 2019-06-30 NOTE — ED Provider Notes (Signed)
[facility] DEPT Provider Note   CSN: [id number] Arrival date & time: 06/30/19  0228     History   Chief Complaint Chief Complaint  Patient presents with   Seizures    HPI Brandi Gomez is a 48 y.o. female.     Patient here by EMS after suspected seizure.  She does not recall what happened.  History obtained from patient's friend [name] [phone].  States patient woke up from sleep screaming and fell  Off the bed onto the floor.  Does not think that she hit her head.  She had a witnessed seizure lasted for about 5 minutes by his report.  Patient now alert and oriented.  She did have some tongue biting.  No incontinence.  [name] states patient has not had a seizure medication in 3 days which includes Vimpat and carbamazepine.  Patient states that she does not take her medicines because she believes God will take care of her seizures.  She denies any alcohol or drug use.  She denies any head, neck, back, chest or abdominal pain.  No focal weakness, numbness or tingling.  She denies any alcohol or drug use.  She does not recall the last time she had her seizure medications.  She denies feeling suicidal or homicidal.  The history is provided by the patient.  Seizures   Past Medical History:  Diagnosis Date   Anemia    Anxiety    Bipolar 1 disorder ([place])    Common migraine 05/19/2015   Depression    Diabetes mellitus, type II ([place])    Hypertension    Irritable bowel syndrome (IBS)    Mild mental retardation    Obesity    Partial complex seizure disorder with intractable epilepsy ([place]) 05/12/2014   Seizures ([place])    intractable, sz 08/23/17   Sleep apnea    Stroke ([place])    Type II or unspecified type diabetes mellitus without mention of complication, not stated as uncontrolled     Patient Active Problem List   Diagnosis Date Noted   Bipolar disorder ([place]) 05/13/2019   Bipolar 1 disorder ([place]) 05/13/2019   Adjustment  disorder with anxiety    History of recent stroke 08/06/2017   OSA (obstructive sleep apnea)    Cryptogenic stroke ([place]) 05/20/2017   Controlled type 2 diabetes mellitus without complication, without long-term current use of insulin ([place]) 05/08/2017   Essential hypertension 07/27/2008   Seizure disorder ([place]) 02/05/2007    Past Surgical History:  Procedure Laterality Date   COLONOSCOPY     2012-normal , Dr Sharlett Iles   ESOPHAGOGASTRODUODENOSCOPY     normal-Dr Patterson 2012   LOOP RECORDER INSERTION N/A 05/30/2017   Procedure: Loop Recorder Insertion;  Surgeon: Constance Haw, MD;  Location: [facility];  Service: Cardiovascular;  Laterality: N/A;   LOOP RECORDER REMOVAL N/A 03/04/2018   Procedure: LOOP RECORDER REMOVAL;  Surgeon: Constance Haw, MD;  Location: [facility];  Service: Cardiovascular;  Laterality: N/A;   MYRINGOTOMY WITH TUBE PLACEMENT     MYRINGOTOMY WITH TUBE PLACEMENT Right 11/05/2017   Procedure: MYRINGOTOMY WITH TUBE PLACEMENT;  Surgeon: Melissa Montane, MD;  Location: [facility];  Service: ENT;  Laterality: Right;  right T Tube placement   NASAL SINUS SURGERY     TEE WITHOUT CARDIOVERSION N/A 05/30/2017   Procedure: TRANSESOPHAGEAL ECHOCARDIOGRAM (TEE);  Surgeon: Acie Fredrickson Wonda Cheng, MD;  Location: [facility];  Service: Cardiovascular;  Laterality: N/A;     OB  History   No obstetric history on file.      Home Medications    Prior to Admission medications   Medication Sig Start Date End Date Taking? Authorizing Provider  ARIPiprazole (ABILIFY) 5 MG tablet Take 1 tablet (5 mg total) by mouth daily. 05/21/19   Plovsky, Berneta Sages, MD  atorvastatin (LIPITOR) 10 MG tablet Take 1 tablet (10 mg total) by mouth daily at 6 PM. 05/12/19 08/10/19  Horald Pollen, MD  Blood Glucose Monitoring Suppl KIT Please supply kit covered by insurance and supplies for testing four times daily. E11.9 E10.9 05/12/19   Horald Pollen, MD  Blood Pressure  Monitor KIT Take blood pressure reading twice a day for the next 2 weeks and then once a day. 05/12/19   Horald Pollen, MD  carbamazepine (TEGRETOL) 200 MG tablet 1  bid 05/21/19   Plovsky, Berneta Sages, MD  carvedilol (COREG) 3.125 MG tablet TAKE 1 TABLET (3.125 MG TOTAL) BY MOUTH DAILY. 05/18/19   Horald Pollen, MD  clotrimazole (LOTRIMIN) 1 % cream Apply to affected area 2 times daily Patient not taking: Reported on 06/21/2019 05/03/19   Wieters, Hallie C, PA-C  DULoxetine (CYMBALTA) 30 MG capsule Take 1 capsule (30 mg total) by mouth daily. 03/17/19   Plovsky, Berneta Sages, MD  JANUVIA 50 MG tablet TAKE 1 TABLET (50 MG TOTAL) BY MOUTH DAILY. SCHEDULE APPOINTMENT FOR ADDITIONAL REFILLS. 05/17/19   Horald Pollen, MD  lacosamide (VIMPAT) 200 MG TABS tablet Take 1 tablet (200 mg total) by mouth 2 (two) times daily. 05/10/19   [name], MD  naproxen (NAPROSYN) 375 MG tablet Take 1 tablet (375 mg total) by mouth 2 (two) times daily. 05/03/19   Wieters, Hallie C, PA-C  nystatin (MYCOSTATIN) 100000 UNIT/ML suspension Take 5 mLs (500,000 Units total) by mouth 4 (four) times daily. Patient not taking: Reported on 06/21/2019 05/11/19   Hall-Potvin, [place], PA-C  predniSONE (DELTASONE) 20 MG tablet Take 1 tablet (20 mg total) by mouth daily. 05/11/19   Hall-Potvin, [place], PA-C  torsemide (DEMADEX) 10 MG tablet Take 1 tablet (10 mg total) by mouth daily. 06/21/19 09/19/19  Horald Pollen, MD  Vitamin D, Ergocalciferol, (DRISDOL) 1.25 MG (50000 UT) CAPS capsule Take 1 capsule (50,000 Units total) by mouth every 7 (seven) days. 06/23/19   Horald Pollen, MD    Family History Family History  Problem Relation Age of Onset   Diabetes Mother        passed away from accidental death   Hypertension Mother    Bipolar disorder Father    Diabetes Daughter    Leukemia Daughter    Ovarian cancer Maternal Aunt     Social History Social History   Tobacco Use   Smoking status: Never  Smoker   Smokeless tobacco: Never Used  Substance Use Topics   Alcohol use: No   Drug use: No     Allergies   Amoxicillin, Lisinopril, Hydrocodone, and Tegretol [carbamazepine]   Review of Systems Review of Systems  Constitutional: Negative for activity change, appetite change and fever.  HENT: Negative for congestion and rhinorrhea.   Eyes: Negative for visual disturbance.  Respiratory: Negative for cough, chest tightness and shortness of breath.   Cardiovascular: Negative for chest pain.  Gastrointestinal: Negative for abdominal pain, nausea and vomiting.  Genitourinary: Negative for dysuria and hematuria.  Musculoskeletal: Negative for arthralgias and myalgias.  Neurological: Positive for seizures. Negative for dizziness and headaches.    all other systems are negative  except as noted in the HPI and PMH.    Physical Exam Updated Vital Signs BP 121/82    Pulse 76    Temp 99.1 F (37.3 C)    Resp 14    Ht '5\' 1"'$  (1.549 m)    Wt 115 kg    SpO2 99%    BMI 47.90 kg/m   Physical Exam Vitals signs and nursing note reviewed.  Constitutional:      General: She is not in acute distress.    Appearance: She is well-developed.  HENT:     Head: Normocephalic and atraumatic.     Mouth/Throat:     Pharynx: No oropharyngeal exudate.     Comments: Tongue biting present Eyes:     Conjunctiva/sclera: Conjunctivae normal.     Pupils: Pupils are equal, round, and reactive to light.  Neck:     Musculoskeletal: Normal range of motion and neck supple.     Comments: No C-spine tenderness Cardiovascular:     Rate and Rhythm: Normal rate and regular rhythm.     Heart sounds: Normal heart sounds. No murmur.  Pulmonary:     Effort: Pulmonary effort is normal. No respiratory distress.     Breath sounds: Normal breath sounds.  Abdominal:     Palpations: Abdomen is soft.     Tenderness: There is no abdominal tenderness. There is no guarding or rebound.  Musculoskeletal: Normal range of  motion.        General: No tenderness.  Skin:    General: Skin is warm.     Capillary Refill: Capillary refill takes less than 2 seconds.  Neurological:     General: No focal deficit present.     Mental Status: She is alert and oriented to person, place, and time. Mental status is at baseline.     Cranial Nerves: No cranial nerve deficit.     Motor: No abnormal muscle tone.     Coordination: Coordination normal.     Comments:  5/5 strength throughout. CN 2-12 intact.Equal grip strength.   Psychiatric:        Behavior: Behavior normal.      ED Treatments / Results  Labs (all labs ordered are listed, but only abnormal results are displayed) Labs Reviewed  COMPREHENSIVE METABOLIC PANEL - Abnormal; Notable for the following components:      Result Value   Glucose, Bld 148 (*)    BUN 21 (*)    Creatinine, Ser 1.16 (*)    Calcium 8.1 (*)    Albumin 3.2 (*)    GFR calc non Af Amer 55 (*)    All other components within normal limits  URINALYSIS, ROUTINE W REFLEX MICROSCOPIC  CBC WITH DIFFERENTIAL/PLATELET  CARBAMAZEPINE LEVEL, TOTAL  CBC WITH DIFFERENTIAL/PLATELET    EKG EKG Interpretation  Date/Time:  Wednesday June 30 2019 04:51:51 EDT Ventricular Rate:  53 PR Interval:    QRS Duration: 87 QT Interval:  399 QTC Calculation: 375 R Axis:   77 Text Interpretation:  Sinus rhythm Abnormal R-wave progression, early transition Borderline T abnormalities, diffuse leads No significant change was found Confirmed by Ezequiel Essex [phone]) on 06/30/2019 4:56:43 AM   Radiology Ct Head Wo Contrast  Result Date: 06/30/2019 CLINICAL DATA:  Seizure, encephalopathy. EXAM: CT HEAD WITHOUT CONTRAST TECHNIQUE: Contiguous axial images were obtained from the base of the skull through the vertex without intravenous contrast. COMPARISON:  05/08/2019 FINDINGS: Brain: No acute intracranial abnormality. Specifically, no hemorrhage, hydrocephalus, mass lesion, acute infarction, or significant  intracranial injury. Vascular: No hyperdense vessel or unexpected calcification. Skull: No acute calvarial abnormality. Sinuses/Orbits: Visualized paranasal sinuses and mastoids clear. Orbital soft tissues unremarkable. Other: None IMPRESSION: No acute intracranial abnormality. Electronically Signed   By: Rolm Baptise M.D.   On: 06/30/2019 08:07    Procedures Procedures (including critical care time)  Medications Ordered in ED Medications - No data to display   Initial Impression / Assessment and Plan / ED Course  I have reviewed the triage vital signs and the nursing notes.  Pertinent labs & imaging results that were available during my care of the patient were reviewed by me and considered in my medical decision making (see chart for details).       Patient with witnessed seizure in setting of noncompliance.  States that "God is going to take care of my seizures and I do not need medication".  She is not suicidal or homicidal.  She is awake and alert and oriented x3.  She does not recall what happened.  Patient significant other reports that she was seizing at home and hit her head.  Patient no longer on carbamazepine it appears.  Discussed with Dr. Leonel Ramsay of neurology.  States we can give her usual Vimpat dose of 200 mg in the ED and place her back on her regular medications.  Imaging will be obtained given her possible head trauma.  She remains alert and oriented without confusion.  TTS consult will be obtained given her refusal to take medications and stating that "God will take care of me".  Labs reassuring. CT head negative.  Patient remains oriented and seems to be at baseline.  Dr. Kathrynn Humble to assume care at shift change. TTS consult pending.   Final Clinical Impressions(s) / ED Diagnoses   Final diagnoses:  None    ED Discharge Orders    None       Jalyric Kaestner, Annie Main, MD 06/30/19 [phone]

## 2019-06-30 NOTE — Telephone Encounter (Signed)
I called the patient.  The patient had a seizure today, apparently she stopped her carbamazepine and Vimpat a couple weeks ago because she felt poorly on the medication.  I will get her started on Lamictal now, she will be seen back in the office on 5 August.

## 2019-06-30 NOTE — ED Notes (Signed)
Patient transported to CT 

## 2019-06-30 NOTE — Consult Note (Signed)
Patient presents to the hospital due to concern for seizure in the setting of poor medication compliance. She denies SI, HI or AVH. She denies alcohol or illicit substance use. She is appropriate in behavior and organized in thought process. She does not appear to be responding to internal stimuli. She is encouraged to follow up with her outpatient social worker for assistance with psychosocial issues including housing.   Buford Dresser, DO 06/30/19 7:14 PM

## 2019-06-30 NOTE — Telephone Encounter (Signed)
I called pt about her medication. Pt stated she was seen in the ED because she had a seizure. She wants to switch to another medication. Pt also stated she had a CT scan and wants the results. I stated message will be sent to Dr.WIllis. The pt verbalized understanding.

## 2019-06-30 NOTE — ED Notes (Signed)
Patient was given chicken soup and soda.

## 2019-06-30 NOTE — Addendum Note (Signed)
Addended by: [name] on: 06/30/2019 04:54 PM   Modules accepted: Orders

## 2019-06-30 NOTE — ED Triage Notes (Signed)
Per EMS, Pt had a witnessed seizure. Pt has not been compliant with her seizure medication. Pt now alert and oriented 4x. No obvious signs of trauma to head or mouth. Unknown how long pt seized. Pt now A&O 4x.

## 2019-06-30 NOTE — BH Assessment (Signed)
[facility] Assessment Progress Note  Per Buford Dresser, DO, this pt does not require psychiatric hospitalization at this time.  Pt is to be discharged from [facility] with recommendation to keep her previously scheduled appointment with Norma Fredrickson, MD at the [facility] at [place].  Appointment is set for Friday, 7/31/ 2020 at 11:30 am.  This has been included in pt's discharge instructions.  Pt's nurse has been notified.  Jalene Mullet, [place] Triage Specialist [phone]

## 2019-06-30 NOTE — Telephone Encounter (Signed)
Pt returning call please call back °

## 2019-06-30 NOTE — ED Notes (Signed)
Waiting for medications to be verified in order to administer.

## 2019-06-30 NOTE — ED Notes (Signed)
Patient was given soda and crackers. Patient tolerated w/o any complaints or difficulties.

## 2019-06-30 NOTE — ED Notes (Signed)
Per TTS pt to be observed and reassessed later today

## 2019-06-30 NOTE — ED Provider Notes (Addendum)
Cleared for psych eval. CBC from July was normal, repeat not indicated.  Spoke with pharmacy. Cleared for formulary med.   Varney Biles, MD 06/30/19 [phone]   1:19 PM Psychiatry team has cleared the patient. Admitting home health for home.  Patient would benefit with social work and nursing evaluation to ensure she is taking her meds, questions about medications are appropriately answered and the living situation is appropriate. Pt made aware of the plan.   Varney Biles, MD 06/30/19 1319

## 2019-06-30 NOTE — Discharge Instructions (Signed)
We have requested Social Worker team to come to your home and ensure your home situation is fine and your medications are being taken properly.  For your behavioral health needs, you are advised to continue treatment with your current outpatient providers.  You have an appointment with Norma Fredrickson, MD scheduled for Friday, July 09, 2019 at 11:30 am.  Plan to keep this appointment:       Norma Fredrickson, MD      [facility] at [facility]. [address] [place], [place] [address]      [phone]

## 2019-06-30 NOTE — ED Notes (Signed)
Patient stated she doesn't feel as though she needs to stay in hospital. Patient want to talk to provider. This Probation officer sent provider a secure chat to notify and to ask for his assistance.
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
## 2019-07-01 ENCOUNTER — Telehealth: Payer: Self-pay | Admitting: Neurology

## 2019-07-01 NOTE — Telephone Encounter (Signed)
The patient had some concerns about side effects from the Lamictal, and indicated the medication is very safe, the risk of not taking medication is much higher than taking the medication if she continues to have seizures.  The Lamictal has fewer cognitive side effects, I am hoping that she can tolerate this better than the carbamazepine and the Vimpat.

## 2019-07-01 NOTE — Telephone Encounter (Signed)
Pt is asking for a call from RN to discuss concerns about lamoTRIgine (LAMICTAL) 25 MG tablet, she states she already has a rash and she is unsure as to which medication caused it.  Please call

## 2019-07-01 NOTE — Telephone Encounter (Signed)
I spoke with pt about having a rash on her nose. Pt stated she has had the rash for  3 months. The pt never started the lamictal that was prescribed by Dr. Jannifer Franklin. She is taking the vimpat. PT is unsure where the rash is coming from. She also does not know if the tegretol that's prescribed by her psychiatrist is causing a rash on her nose.

## 2019-07-02 DIAGNOSIS — L811 Chloasma: Secondary | ICD-10-CM | POA: Diagnosis not present

## 2019-07-09 ENCOUNTER — Ambulatory Visit (INDEPENDENT_AMBULATORY_CARE_PROVIDER_SITE_OTHER): Payer: Medicare HMO | Admitting: Psychiatry

## 2019-07-09 ENCOUNTER — Other Ambulatory Visit: Payer: Self-pay

## 2019-07-09 DIAGNOSIS — I639 Cerebral infarction, unspecified: Secondary | ICD-10-CM

## 2019-07-09 DIAGNOSIS — I69398 Other sequelae of cerebral infarction: Secondary | ICD-10-CM | POA: Diagnosis not present

## 2019-07-09 DIAGNOSIS — F0630 Mood disorder due to known physiological condition, unspecified: Secondary | ICD-10-CM

## 2019-07-09 MED ORDER — ARIPIPRAZOLE 10 MG PO TABS: 10.0000 mg | ORAL_TABLET | Freq: Every day | ORAL | 3 refills | Status: DC

## 2019-07-09 MED ORDER — CARBAMAZEPINE 200 MG PO TABS: ORAL_TABLET | ORAL | 2 refills | Status: DC

## 2019-07-09 NOTE — Progress Notes (Signed)
Psychiatric Initial Adult Assessment   Patient Identification: Brandi Gomez MRN:  [id number] Date of Evaluation:  07/09/2019 Referral Source: Dr. Tory Emerald Chief Complaint:  Suspiciousness/paranoia Visit Diagnosis: Mood disorder secondary to CVA  Today the patient seems to be at her baseline.  According to [name] she is just a little bit better.  She still has wishes to go under a bridge and to be with other people.  Her thinking is still somewhat disordered.  Recently Dr. Jannifer Franklin her neurologist put her on Lamictal probably for seizures and perhaps for mood stability.  The patient presently is taking her Tegretol 200 mg twice daily which we will continue.  She takes 5 mg of Abilify.  Her thinking I think is somewhat improved.  At this time we will get a go ahead though and increase the Abilify to the maximum dose of 10 mg.  The patient is sleeping and eating well.  There is no overt evidence of depression.  I think this patient is cognitively impaired.  There are other factors that may be involved including irritability and impulsivity related to her strokes.  I think the use of Lamictal together with Tegretol makes good sense for mood stability reduction of irritability and perhaps to treat some underlying seizure disorder.  We will increase her Abilify to 10 mg but I think we will leave it there.  Patient is not overtly suicidal.  The patient does not seem to be fearful of the virus. Associated Signs/Symptoms: Depression Symptoms:  fatigue, (Hypo) Manic Symptoms:   Anxiety Symptoms:   Psychotic Symptoms:   PTSD Symptoms:   Past Psychiatric History: Cymbalta 30 mg Previous Psychotropic Medications: Cymbalta 30 mg  Substance Abuse History in the last 12 months:  No.  Consequences of Substance Abuse:   Past Medical History:  Past Medical History:  Diagnosis Date  . Anemia   . Anxiety   . Bipolar 1 disorder ([place])   . Common migraine 05/19/2015  . Depression   . Diabetes mellitus,  type II ([place])   . Hypertension   . Irritable bowel syndrome (IBS)   . Mild mental retardation   . Obesity   . Partial complex seizure disorder with intractable epilepsy ([place]) 05/12/2014  . Seizures ([place])    intractable, sz 08/23/17  . Sleep apnea   . Stroke ([place])   . Type II or unspecified type diabetes mellitus without mention of complication, not stated as uncontrolled     Past Surgical History:  Procedure Laterality Date  . COLONOSCOPY     2012-normal , Dr Sharlett Iles  . ESOPHAGOGASTRODUODENOSCOPY     normal-Dr Patterson 2012  . LOOP RECORDER INSERTION N/A 05/30/2017   Procedure: Loop Recorder Insertion;  Surgeon: Constance Haw, MD;  Location: [facility];  Service: Cardiovascular;  Laterality: N/A;  . LOOP RECORDER REMOVAL N/A 03/04/2018   Procedure: LOOP RECORDER REMOVAL;  Surgeon: Constance Haw, MD;  Location: [facility];  Service: Cardiovascular;  Laterality: N/A;  . MYRINGOTOMY WITH TUBE PLACEMENT    . MYRINGOTOMY WITH TUBE PLACEMENT Right 11/05/2017   Procedure: MYRINGOTOMY WITH TUBE PLACEMENT;  Surgeon: Melissa Montane, MD;  Location: [facility];  Service: ENT;  Laterality: Right;  right T Tube placement  . NASAL SINUS SURGERY    . TEE WITHOUT CARDIOVERSION N/A 05/30/2017   Procedure: TRANSESOPHAGEAL ECHOCARDIOGRAM (TEE);  Surgeon: Acie Fredrickson Wonda Cheng, MD;  Location: [facility] ENDOSCOPY;  Service: Cardiovascular;  Laterality: N/A;    Family Psychiatric History:   Family  History:  Family History  Problem Relation Age of Onset  . Diabetes Mother        passed away from accidental death  . Hypertension Mother   . Bipolar disorder Father   . Diabetes Daughter   . Leukemia Daughter   . Ovarian cancer Maternal Aunt     Social History:   Social History   Socioeconomic History  . Marital status: Single    Spouse name: [name]  . Number of children: 3  . Years of education: 67  . Highest education level: Not on file  Occupational History  . Occupation:  disabled    Employer: DISABLED  Social Needs  . Financial resource strain: Not on file  . Food insecurity    Worry: Not on file    Inability: Not on file  . Transportation needs    Medical: Not on file    Non-medical: Not on file  Tobacco Use  . Smoking status: Never Smoker  . Smokeless tobacco: Never Used  Substance and Sexual Activity  . Alcohol use: No  . Drug use: No  . Sexual activity: Never  Lifestyle  . Physical activity    Days per week: Not on file    Minutes per session: Not on file  . Stress: Not on file  Relationships  . Social Herbalist on phone: Not on file    Gets together: Not on file    Attends religious service: Not on file    Active member of club or organization: Not on file    Attends meetings of clubs or organizations: Not on file    Relationship status: Not on file  Other Topics Concern  . Not on file  Social History Narrative   Patient lives at home with daughter.    Patient has 3 children.    Patient is right handed.    Patient has a high school education.    Patient is on disability   Patient drinks 2 cups of caffeine daily.    Additional Social History:   Allergies:   Allergies  Allergen Reactions  . Amoxicillin Itching    Has patient had a PCN reaction causing immediate rash, facial/tongue/throat swelling, SOB or lightheadedness with hypotension: yes Has patient had a PCN reaction causing severe rash involving mucus membranes or skin necrosis: no Has patient had a PCN reaction that required hospitalization: no Has patient had a PCN reaction occurring within the last 10 years: yes If all of the above answers are "NO", then may proceed with Cephalosporin use.  Marland Kitchen Lisinopril Swelling    Angioedema   . Hydrocodone Other (See Comments)    Depressed   . Tegretol [Carbamazepine] Swelling    Throat swells    Metabolic Disorder Labs: Lab Results  Component Value Date   HGBA1C 6.1 (H) 06/21/2019   MPG 163 11/05/2017   MPG  166 05/21/2017   No results found for: PROLACTIN Lab Results  Component Value Date   CHOL 158 05/21/2017   TRIG 161 (H) 05/21/2017   HDL 44 05/21/2017   CHOLHDL 3.6 05/21/2017   VLDL 32 05/21/2017   LDLCALC 82 05/21/2017     Current Medications: Current Outpatient Medications  Medication Sig Dispense Refill  . ARIPiprazole (ABILIFY) 10 MG tablet Take 1 tablet (10 mg total) by mouth daily. 30 tablet 3  . atorvastatin (LIPITOR) 10 MG tablet Take 1 tablet (10 mg total) by mouth daily at 6 PM. 90 tablet 3  . Blood  Glucose Monitoring Suppl KIT Please supply kit covered by insurance and supplies for testing four times daily. E11.9 E10.9 1 each 0  . Blood Pressure Monitor KIT Take blood pressure reading twice a day for the next 2 weeks and then once a day. 1 each 0  . carbamazepine (TEGRETOL) 200 MG tablet 1  bid 60 tablet 2  . carvedilol (COREG) 3.125 MG tablet TAKE 1 TABLET (3.125 MG TOTAL) BY MOUTH DAILY. 30 tablet 1  . clotrimazole (LOTRIMIN) 1 % cream Apply to affected area 2 times daily (Patient not taking: Reported on 06/21/2019) 30 g 0  . JANUVIA 50 MG tablet TAKE 1 TABLET (50 MG TOTAL) BY MOUTH DAILY. SCHEDULE APPOINTMENT FOR ADDITIONAL REFILLS. (Patient taking differently: Take 50 mg by mouth daily. ) 90 tablet 0  . lacosamide (VIMPAT) 200 MG TABS tablet Take 1 tablet (200 mg total) by mouth 2 (two) times daily. 180 tablet 0  . lamoTRIgine (LAMICTAL) 25 MG tablet 1 tablet twice daily for 2 weeks then take 2 tablets twice daily for 2 weeks, then take 3 tablets twice daily 180 tablet 2  . metFORMIN (GLUCOPHAGE) 500 MG tablet Take 500 mg by mouth 2 (two) times daily with a meal.     . naproxen (NAPROSYN) 375 MG tablet Take 1 tablet (375 mg total) by mouth 2 (two) times daily. (Patient not taking: Reported on 06/30/2019) 20 tablet 0  . nystatin (MYCOSTATIN) 100000 UNIT/ML suspension Take 5 mLs (500,000 Units total) by mouth 4 (four) times daily. (Patient not taking: Reported on 06/21/2019)  60 mL 0  . predniSONE (DELTASONE) 20 MG tablet Take 1 tablet (20 mg total) by mouth daily. (Patient not taking: Reported on 06/30/2019) 3 tablet 0  . torsemide (DEMADEX) 10 MG tablet Take 1 tablet (10 mg total) by mouth daily. 90 tablet 1  . Vitamin D, Ergocalciferol, (DRISDOL) 1.25 MG (50000 UT) CAPS capsule Take 1 capsule (50,000 Units total) by mouth every 7 (seven) days. (Patient not taking: Reported on 06/30/2019) 5 capsule 0   No current facility-administered medications for this visit.     Neurologic: Headache: No Seizure: Yes Paresthesias:No  Musculoskeletal: Strength & Muscle Tone: within normal limits Gait & Station: normal Patient leans: Backward and N/A  Psychiatric Specialty Exam: ROS  There were no vitals taken for this visit.There is no height or weight on file to calculate BMI.  General Appearance: Casual  Eye Contact:  Fair  Speech:  Slurred  Volume:  Normal  Mood:  Dysphoric  Affect:  Appropriate  Thought Process:  Goal Directed  Orientation:  Full (Time, Place, and Person)  Thought Content:  Logical  Suicidal Thoughts:  No  Homicidal Thoughts:  No  Memory:  Negative  Judgement:  Fair  Insight:  Lacking  Psychomotor Activity:  Decreased  Concentration:    Recall:  [place] of Knowledge:Fair  Language: Fair  Akathisia:  No  Handed:    AIMS (if indicated):    Assets:  Desire for Improvement  ADL's:  Intact  Cognition: WNL  Sleep:      Treatment Plan Summary: This patient has 1 major problem and that is a mood instability and cognitive impairment related to her stroke.  I am not clear if she has any underlying severe psychiatric illnesses.  At this time the basic target symptoms are her impulsivity to leave her house and to go into bed neighborhoods.  She actually has not been doing that since her last visit.  She takes short  walks around the neighborhood but she never leaves at this time.  Her thinking is still minimal.  I think your processes  slightly improved.  At this time we will continue her Tegretol and the Abilify and increase her Abilify to 10 mg.  This patient she will be seen in this office in 2 months.  She is functioning fairly well and I think she shows some small degree of improvement.  The improvement is characterized and that she has been trying to leave her home although she talks about doing it a lot.  Today we could confront her about that this would be a mistake and she seemed to agree that she would not attempt to take off like she is done in the past. Jerral Ralph, MD 7/31/202011:58 AM

## 2019-07-12 ENCOUNTER — Other Ambulatory Visit: Payer: Self-pay | Admitting: Neurology

## 2019-07-13 NOTE — Progress Notes (Signed)
PATIENT: Brandi Gomez DOB: June 27, 1971  REASON FOR VISIT: follow up HISTORY FROM: patient  HISTORY OF PRESENT ILLNESS: Today 07/14/19  Brandi Gomez is a 48 year old female with history of intractable seizure disorder.  She was taken off Keppra because of some problems with psychosis and she was placed on Vimpat.  She is followed by psychiatry. She has been in the ER May 9, May 30, and  July 17, July 22 for seizures.  She was recently switched off carbamazepine and Vimpat due to side effect.  She was started on Lamictal.  She has been to the ER 15 times since January 2020 for a multitude of complaints.  She was recently in the ER 06/30/2019 for report of seizure, she stopped taking her carbamazepine and Vimpat because she said "God told her to".  She was evaluated by psychiatry and was cleared for discharge home.  She saw her psychiatrist recently was continued on carbamazepine 200 mg twice a day, Abilify 10 mg daily.  She did have a CT scan of the brain 06/30/2019 was normal.  She is here today accompanied by her fianc Brandi Gomez.  She remains on the titration of Lamictal.  She is taking Lamictal 25 mg twice a day.  She is due to go up on the dose in a few days.  She is tolerating the medication.  She did see a dermatologist regarding a rash to the top of her nose for 2 months, was told it is nothing serious.  She says since her last seizure she has been feeling depressed.  She says she wants to get out of the healthy living hotels.  She does not like being at home.  She denies any suicidal ideation.  She presents today for follow-up.  HISTORY 04/13/2019 Brandi Gomez: Brandi Gomez is a 48 year old right-handed black female with a history of an intractable seizure disorder.  She was taken off of [place] recently because of some problems with psychosis, and she was placed on Vimpat.  She is followed by Dr. Casimiro Gomez from psychiatry, at some point she has been placed on carbamazepine taking 200 mg twice  daily.  She went to the emergency room on 10 Apr 2019 with a seizure event that occurred while sleeping.  The patient indicates that she had another seizure about a week prior to this and did not go to the hospital.  The patient is unsure about her medications, but she believes that she may have missed a few doses of the carbamazepine.  She has not missed Vimpat.  The carbamazepine levels in the emergency room were low.  They did not check Vimpat levels.  The patient does not operate a motor vehicle.  She does report some swelling in the ankles recently.  She denies any drowsiness or gait instability on the medications.  REVIEW OF SYSTEMS: Out of a complete 14 system review of symptoms, the patient complains only of the following symptoms, and all other reviewed systems are negative.  Seizures, depression  ALLERGIES: Allergies  Allergen Reactions   Amoxicillin Itching    Has patient had a PCN reaction causing immediate rash, facial/tongue/throat swelling, SOB or lightheadedness with hypotension: yes Has patient had a PCN reaction causing severe rash involving mucus membranes or skin necrosis: no Has patient had a PCN reaction that required hospitalization: no Has patient had a PCN reaction occurring within the last 10 years: yes If all of the above answers are "NO", then may proceed with Cephalosporin use.   Lisinopril  Swelling    Angioedema    Hydrocodone Other (See Comments)    Depressed    Tegretol [Carbamazepine] Swelling    Throat swells    HOME MEDICATIONS: Outpatient Medications Prior to Visit  Medication Sig Dispense Refill   ARIPiprazole (ABILIFY) 5 MG tablet Take 5 mg by mouth daily.     atorvastatin (LIPITOR) 10 MG tablet Take 1 tablet (10 mg total) by mouth daily at 6 PM. 90 tablet 3   Blood Glucose Monitoring Suppl KIT Please supply kit covered by insurance and supplies for testing four times daily. E11.9 E10.9 1 each 0   Blood Pressure Monitor KIT Take blood pressure  reading twice a day for the next 2 weeks and then once a day. 1 each 0   carvedilol (COREG) 3.125 MG tablet TAKE 1 TABLET (3.125 MG TOTAL) BY MOUTH DAILY. 30 tablet 1   JANUVIA 50 MG tablet TAKE 1 TABLET (50 MG TOTAL) BY MOUTH DAILY. SCHEDULE APPOINTMENT FOR ADDITIONAL REFILLS. (Patient taking differently: Take 50 mg by mouth daily. ) 90 tablet 0   lamoTRIgine (LAMICTAL) 25 MG tablet 1 tablet twice daily for 2 weeks then take 2 tablets twice daily for 2 weeks, then take 3 tablets twice daily 180 tablet 2   naproxen (NAPROSYN) 375 MG tablet Take 1 tablet (375 mg total) by mouth 2 (two) times daily. 20 tablet 0   torsemide (DEMADEX) 10 MG tablet Take 1 tablet (10 mg total) by mouth daily. 90 tablet 1   Vitamin D, Ergocalciferol, (DRISDOL) 1.25 MG (50000 UT) CAPS capsule Take 1 capsule (50,000 Units total) by mouth every 7 (seven) days. (Patient not taking: Reported on 06/30/2019) 5 capsule 0   ARIPiprazole (ABILIFY) 10 MG tablet Take 1 tablet (10 mg total) by mouth daily. 30 tablet 3   carbamazepine (TEGRETOL) 200 MG tablet 1  bid 60 tablet 2   clotrimazole (LOTRIMIN) 1 % cream Apply to affected area 2 times daily (Patient not taking: Reported on 06/21/2019) 30 g 0   lacosamide (VIMPAT) 200 MG TABS tablet Take 1 tablet (200 mg total) by mouth 2 (two) times daily. (Patient not taking: Reported on 07/14/2019) 180 tablet 0   metFORMIN (GLUCOPHAGE) 500 MG tablet Take 500 mg by mouth 2 (two) times daily with a meal.      nystatin (MYCOSTATIN) 100000 UNIT/ML suspension Take 5 mLs (500,000 Units total) by mouth 4 (four) times daily. (Patient not taking: Reported on 06/21/2019) 60 mL 0   predniSONE (DELTASONE) 20 MG tablet Take 1 tablet (20 mg total) by mouth daily. (Patient not taking: Reported on 06/30/2019) 3 tablet 0   No facility-administered medications prior to visit.     PAST MEDICAL HISTORY: Past Medical History:  Diagnosis Date   Anemia    Anxiety    Bipolar 1 disorder ([place])     Common migraine 05/19/2015   Depression    Diabetes mellitus, type II ([place])    Hypertension    Irritable bowel syndrome (IBS)    Mild mental retardation    Obesity    Partial complex seizure disorder with intractable epilepsy ([place]) 05/12/2014   Seizures ([place])    intractable, sz 08/23/17   Sleep apnea    Stroke ([place])    Type II or unspecified type diabetes mellitus without mention of complication, not stated as uncontrolled     PAST SURGICAL HISTORY: Past Surgical History:  Procedure Laterality Date   COLONOSCOPY     2012-normal , Dr Sharlett Iles   ESOPHAGOGASTRODUODENOSCOPY  normal-Dr Patterson 2012   LOOP RECORDER INSERTION N/A 05/30/2017   Procedure: Loop Recorder Insertion;  Surgeon: Constance Haw, MD;  Location: [facility];  Service: Cardiovascular;  Laterality: N/A;   LOOP RECORDER REMOVAL N/A 03/04/2018   Procedure: LOOP RECORDER REMOVAL;  Surgeon: Constance Haw, MD;  Location: [facility];  Service: Cardiovascular;  Laterality: N/A;   MYRINGOTOMY WITH TUBE PLACEMENT     MYRINGOTOMY WITH TUBE PLACEMENT Right 11/05/2017   Procedure: MYRINGOTOMY WITH TUBE PLACEMENT;  Surgeon: Melissa Montane, MD;  Location: [facility];  Service: ENT;  Laterality: Right;  right T Tube placement   NASAL SINUS SURGERY     TEE WITHOUT CARDIOVERSION N/A 05/30/2017   Procedure: TRANSESOPHAGEAL ECHOCARDIOGRAM (TEE);  Surgeon: Acie Fredrickson, Wonda Cheng, MD;  Location: [facility] ENDOSCOPY;  Service: Cardiovascular;  Laterality: N/A;    FAMILY HISTORY: Family History  Problem Relation Age of Onset   Diabetes Mother        passed away from accidental death   Hypertension Mother    Bipolar disorder Father    Diabetes Daughter    Leukemia Daughter    Ovarian cancer Maternal Aunt     SOCIAL HISTORY: Social History   Socioeconomic History   Marital status: Single    Spouse name: Brandi Gomez   Number of children: 3   Years of education: 12   Highest education level: Not  on file  Occupational History   Occupation: disabled    Fish farm manager: DISABLED  Social Designer, fashion/clothing strain: Not on file   Food insecurity    Worry: Not on file    Inability: Not on file   Transportation needs    Medical: Not on file    Non-medical: Not on file  Tobacco Use   Smoking status: Never Smoker   Smokeless tobacco: Never Used  Substance and Sexual Activity   Alcohol use: No   Drug use: No   Sexual activity: Never  Lifestyle   Physical activity    Days per week: Not on file    Minutes per session: Not on file   Stress: Not on file  Relationships   Social connections    Talks on phone: Not on file    Gets together: Not on file    Attends religious service: Not on file    Active member of club or organization: Not on file    Attends meetings of clubs or organizations: Not on file    Relationship status: Not on file   Intimate partner violence    Fear of current or ex partner: Not on file    Emotionally abused: Not on file    Physically abused: Not on file    Forced sexual activity: Not on file  Other Topics Concern   Not on file  Social History Narrative   Patient lives at home with daughter.    Patient has 3 children.    Patient is right handed.    Patient has a high school education.    Patient is on disability   Patient drinks 2 cups of caffeine daily.    PHYSICAL EXAM  Vitals:   07/14/19 1040  BP: 111/65  Pulse: (!) 54  Temp: 97.7 F (36.5 C)  Weight: 266 lb (120.7 kg)  Height: '5\' 1"'$  (1.549 m)   Body mass index is 50.26 kg/m.  Generalized: Well developed, in no acute distress   Neurological examination  Mentation: Alert oriented to time, place, history taking. Follows  all commands speech and language fluent Cranial nerve II-XII: Pupils were equal round reactive to light. Extraocular movements were full, visual field were full on confrontational test. Facial sensation and strength were normal.  Head turning and  shoulder shrug  were normal and symmetric. Motor: The motor testing reveals 5 over 5 strength of all 4 extremities. Good symmetric motor tone is noted throughout.  Sensory: Sensory testing is intact to soft touch on all 4 extremities. No evidence of extinction is noted.  Coordination: Cerebellar testing reveals good finger-nose-finger and heel-to-shin bilaterally.  Gait and station: Gait is normal. Tandem gait is normal.   Reflexes: Deep tendon reflexes are symmetric and normal bilaterally.   DIAGNOSTIC DATA (LABS, IMAGING, TESTING) - I reviewed patient records, labs, notes, testing and imaging myself where available.  Lab Results  Component Value Date   WBC 6.0 06/30/2019   HGB 9.7 (L) 06/30/2019   HCT 31.8 (L) 06/30/2019   MCV 96.4 06/30/2019   PLT PLATELET CLUMPS NOTED ON SMEAR, UNABLE TO ESTIMATE 06/30/2019      Component Value Date/Time   NA 136 06/30/2019 0252   NA 142 06/21/2019 1618   K 3.9 06/30/2019 0252   CL 105 06/30/2019 0252   CO2 22 06/30/2019 0252   GLUCOSE 148 (H) 06/30/2019 0252   BUN 21 (H) 06/30/2019 0252   BUN 22 06/21/2019 1618   CREATININE 1.16 (H) 06/30/2019 0252   CREATININE 0.72 01/30/2015 1241   CALCIUM 8.1 (L) 06/30/2019 0252   PROT 6.8 06/30/2019 0252   PROT 7.5 06/21/2019 1618   ALBUMIN 3.2 (L) 06/30/2019 0252   ALBUMIN 4.0 06/21/2019 1618   AST 16 06/30/2019 0252   ALT 14 06/30/2019 0252   ALKPHOS 59 06/30/2019 0252   BILITOT 0.3 06/30/2019 0252   BILITOT 0.3 06/21/2019 1618   GFRNONAA 55 (L) 06/30/2019 0252   GFRNONAA >89 01/30/2015 1241   GFRAA >60 06/30/2019 0252   GFRAA >89 01/30/2015 1241   Lab Results  Component Value Date   CHOL 158 05/21/2017   HDL 44 05/21/2017   LDLCALC 82 05/21/2017   TRIG 161 (H) 05/21/2017   CHOLHDL 3.6 05/21/2017   Lab Results  Component Value Date   HGBA1C 6.1 (H) 06/21/2019   Lab Results  Component Value Date   VITAMINB12 379 05/13/2014   Lab Results  Component Value Date   TSH 1.220  02/10/2018    ASSESSMENT AND PLAN 48 y.o. year old female  has a past medical history of Anemia, Anxiety, Bipolar 1 disorder ([place]), Common migraine (05/19/2015), Depression, Diabetes mellitus, type II ([place]), Hypertension, Irritable bowel syndrome (IBS), Mild mental retardation, Obesity, Partial complex seizure disorder with intractable epilepsy ([place]) (05/12/2014), Seizures ([place]), Sleep apnea, Stroke ([place]), and Type II or unspecified type diabetes mellitus without mention of complication, not stated as uncontrolled. here with:  1.  History of intractable seizures 2.  Depression  We discussed the importance of her taking her seizure medications as prescribed.  She will continue the titration of Lamictal.  In a few days she will be increasing her dose to 50 mg twice a day.  Once she has been taking 75 mg twice a day, she will call our office for a new prescription for Lamictal 100 mg twice a day for seizure prevention.  She and her fianc, Brandi Gomez verbalized understanding.  She is tolerating medication without side effect.  In regards to her reported depression, I suggested she contact her psychiatrist or go to the emergency room if she  feels that it is significant enough.  She denies any suicidal ideation at this time.  She did just see her psychiatrist on 07/09/2019.  At that time, her Abilify was increased.  She will follow-up in this office in 4 months or sooner if needed.  I advised that if her symptoms worsen or she develops any new symptoms she should let us know.   I spent 25 minutes with the patient. 50% of this time was spent discussing her plan of care.   Butler Denmark, AGNP-C, DNP 07/14/2019, 11:03 AM [facility] [address] [place], [place] [address] [phone]

## 2019-07-14 ENCOUNTER — Other Ambulatory Visit: Payer: Self-pay

## 2019-07-14 ENCOUNTER — Encounter (HOSPITAL_COMMUNITY): Payer: Self-pay | Admitting: Emergency Medicine

## 2019-07-14 ENCOUNTER — Ambulatory Visit (INDEPENDENT_AMBULATORY_CARE_PROVIDER_SITE_OTHER): Payer: Medicare HMO | Admitting: Neurology

## 2019-07-14 ENCOUNTER — Encounter: Payer: Self-pay | Admitting: Neurology

## 2019-07-14 ENCOUNTER — Emergency Department (HOSPITAL_COMMUNITY)
Admission: EM | Admit: 2019-07-14 | Discharge: 2019-07-14 | Disposition: A | Discharge status: Home / Self Care | Payer: Medicare HMO | Attending: Emergency Medicine | Admitting: Emergency Medicine

## 2019-07-14 VITALS — BP 111/65 | HR 54 | Temp 97.7°F | Ht 61.0 in | Wt 266.0 lb

## 2019-07-14 DIAGNOSIS — G40909 Epilepsy, unspecified, not intractable, without status epilepticus: Secondary | ICD-10-CM | POA: Diagnosis not present

## 2019-07-14 DIAGNOSIS — E119 Type 2 diabetes mellitus without complications: Secondary | ICD-10-CM | POA: Insufficient documentation

## 2019-07-14 DIAGNOSIS — Z7984 Long term (current) use of oral hypoglycemic drugs: Secondary | ICD-10-CM | POA: Insufficient documentation

## 2019-07-14 DIAGNOSIS — F329 Major depressive disorder, single episode, unspecified: Secondary | ICD-10-CM | POA: Diagnosis not present

## 2019-07-14 DIAGNOSIS — Z79899 Other long term (current) drug therapy: Secondary | ICD-10-CM | POA: Diagnosis not present

## 2019-07-14 DIAGNOSIS — F331 Major depressive disorder, recurrent, moderate: Secondary | ICD-10-CM | POA: Insufficient documentation

## 2019-07-14 DIAGNOSIS — Z8673 Personal history of transient ischemic attack (TIA), and cerebral infarction without residual deficits: Secondary | ICD-10-CM | POA: Insufficient documentation

## 2019-07-14 DIAGNOSIS — F32A Depression, unspecified: Secondary | ICD-10-CM

## 2019-07-14 DIAGNOSIS — I10 Essential (primary) hypertension: Secondary | ICD-10-CM | POA: Insufficient documentation

## 2019-07-14 DIAGNOSIS — F321 Major depressive disorder, single episode, moderate: Secondary | ICD-10-CM

## 2019-07-14 LAB — ETHANOL: Alcohol, Ethyl (B): <10 mg/dL (ref <10)

## 2019-07-14 LAB — RAPID URINE DRUG SCREEN, HOSP PERFORMED
Amphetamines: NOT DETECTED
Barbiturates: NOT DETECTED
Benzodiazepines: NOT DETECTED
Cocaine: NOT DETECTED
Opiates: NOT DETECTED
Tetrahydrocannabinol: NOT DETECTED

## 2019-07-14 LAB — CBC
HCT: 34.1 % — ABNORMAL LOW (ref 36.0–46.0)
Hemoglobin: 10.3 g/dL — ABNORMAL LOW (ref 12.0–15.0)
MCH: 30.1 pg (ref 26.0–34.0)
MCHC: 30.2 g/dL (ref 30.0–36.0)
MCV: 99.7 fL (ref 80.0–100.0)
Platelets: 242 10*3/uL (ref 150–400)
RBC: 3.42 MIL/uL — ABNORMAL LOW (ref 3.87–5.11)
RDW: 14.2 % (ref 11.5–15.5)
WBC: 8.1 10*3/uL (ref 4.0–10.5)
nRBC: 0 % (ref 0.0–0.2)

## 2019-07-14 LAB — COMPREHENSIVE METABOLIC PANEL
ALT: 14 U/L (ref 0–44)
AST: 14 U/L — ABNORMAL LOW (ref 15–41)
Albumin: 3.2 g/dL — ABNORMAL LOW (ref 3.5–5.0)
Alkaline Phosphatase: 67 U/L (ref 38–126)
Anion gap: 8 (ref 5–15)
BUN: 16 mg/dL (ref 6–20)
CO2: 28 mmol/L (ref 22–32)
Calcium: 8.5 mg/dL — ABNORMAL LOW (ref 8.9–10.3)
Chloride: 105 mmol/L (ref 98–111)
Creatinine, Ser: 1.24 mg/dL — ABNORMAL HIGH (ref 0.44–1.00)
GFR calc Af Amer: 59 mL/min — ABNORMAL LOW (ref >60)
GFR calc non Af Amer: 51 mL/min — ABNORMAL LOW (ref >60)
Glucose, Bld: 118 mg/dL — ABNORMAL HIGH (ref 70–99)
Potassium: 3.8 mmol/L (ref 3.5–5.1)
Sodium: 141 mmol/L (ref 135–145)
Total Bilirubin: 0.2 mg/dL — ABNORMAL LOW (ref 0.3–1.2)
Total Protein: 6.9 g/dL (ref 6.5–8.1)

## 2019-07-14 NOTE — BH Assessment (Signed)
[facility] Assessment Progress Note  Per Buford Dresser, DO, this pt does not require psychiatric hospitalization at this time.  Pt is to be discharged from [facility] with outpatient referrals.  Pt is a current client of the [facility] [place], where she sees Norma Fredrickson, MD.  Her next appointment is scheduled for Wednesday 09/08/2019 at 15:00.  This has been included in pt's discharge instructions, along with a recommendation to call them at her earliest opportunity to schedule a therapy appointment.  Pt's nurse, Diane, has been notified.  Jalene Mullet, [place] Triage Specialist [phone]

## 2019-07-14 NOTE — Discharge Instructions (Addendum)
It was our pleasure to provide your ER care today - we hope that you feel better.  If worse, having mental health issue and/or crisis, you may go directly to [facility].  For your behavioral health needs, you are advised to continue treatment at the [facility].  Your next psychiatry appointment with Brandi Fredrickson, MD is scheduled for Wednesday, September 08, 2019 at 3:00 pm.  You may also benefit from seeing a therapist.  Call them at your earliest opportunity and ask to schedule an appointment:       [facility]. [address] [place], [place] [address]      [phone]

## 2019-07-14 NOTE — Patient Instructions (Signed)
Please continue the titration of Lamictal for seizures. When you have finished the increase of 75 mg twice daily, call for a new prescription of 100 mg twice a day. If you are feeling depressed, please contact your psychiatrist or go to the ER for further care. We will see you in 4 months.

## 2019-07-14 NOTE — ED Provider Notes (Addendum)
[place] DEPT Provider Note   CSN: [id number] Arrival date & time: 07/14/19  1159     History   Chief Complaint Chief Complaint  Patient presents with   Medical Clearance    HPI Brandi Gomez is a 48 y.o. female.     Patient with hx bipolar disorder c/o increased feelings of depression in past few weeks. Symptoms gradual onset, moderate, persistent, without acute or abrupt worsening today. Denies specific stressor. States lives w family and a friend, and is taking meds per normal routine. Recently saw her psychiatrist w same, meds adjusted, but states feels no different. Denies suicidal thoughts or plan. No attempt to harm self. Normal appetite. No wt change. No trouble sleeping. No hallucinations. States physical health at baseline.   The history is provided by the patient.    Past Medical History:  Diagnosis Date   Anemia    Anxiety    Bipolar 1 disorder ([place])    Common migraine 05/19/2015   Depression    Diabetes mellitus, type II ([place])    Hypertension    Irritable bowel syndrome (IBS)    Mild mental retardation    Obesity    Partial complex seizure disorder with intractable epilepsy ([place]) 05/12/2014   Seizures ([place])    intractable, sz 08/23/17   Sleep apnea    Stroke ([place])    Type II or unspecified type diabetes mellitus without mention of complication, not stated as uncontrolled     Patient Active Problem List   Diagnosis Date Noted   Depression 07/14/2019   Bipolar disorder ([place]) 05/13/2019   Bipolar 1 disorder ([place]) 05/13/2019   Adjustment disorder with anxiety    History of recent stroke 08/06/2017   OSA (obstructive sleep apnea)    Cryptogenic stroke ([place]) 05/20/2017   Controlled type 2 diabetes mellitus without complication, without long-term current use of insulin ([place]) 05/08/2017   Essential hypertension 07/27/2008   Seizure disorder ([place]) 02/05/2007    Past Surgical History:  Procedure  Laterality Date   COLONOSCOPY     2012-normal , Dr Sharlett Iles   ESOPHAGOGASTRODUODENOSCOPY     normal-Dr Patterson 2012   LOOP RECORDER INSERTION N/A 05/30/2017   Procedure: Loop Recorder Insertion;  Surgeon: Constance Haw, MD;  Location: [place] [facility];  Service: Cardiovascular;  Laterality: N/A;   LOOP RECORDER REMOVAL N/A 03/04/2018   Procedure: LOOP RECORDER REMOVAL;  Surgeon: Constance Haw, MD;  Location: [place] [facility];  Service: Cardiovascular;  Laterality: N/A;   MYRINGOTOMY WITH TUBE PLACEMENT     MYRINGOTOMY WITH TUBE PLACEMENT Right 11/05/2017   Procedure: MYRINGOTOMY WITH TUBE PLACEMENT;  Surgeon: Melissa Montane, MD;  Location: [place];  Service: ENT;  Laterality: Right;  right T Tube placement   NASAL SINUS SURGERY     TEE WITHOUT CARDIOVERSION N/A 05/30/2017   Procedure: TRANSESOPHAGEAL ECHOCARDIOGRAM (TEE);  Surgeon: Acie Fredrickson Wonda Cheng, MD;  Location: [facility] ENDOSCOPY;  Service: Cardiovascular;  Laterality: N/A;     OB History   No obstetric history on file.      Home Medications    Prior to Admission medications   Medication Sig Start Date End Date Taking? Authorizing Provider  ARIPiprazole (ABILIFY) 5 MG tablet Take 5 mg by mouth daily.    [provider]  atorvastatin (LIPITOR) 10 MG tablet Take 1 tablet (10 mg total) by mouth daily at 6 PM. 05/12/19 08/10/19  Horald Pollen, MD  Blood Glucose Monitoring Suppl KIT Please supply kit  covered by insurance and supplies for testing four times daily. E11.9 E10.9 05/12/19   Horald Pollen, MD  Blood Pressure Monitor KIT Take blood pressure reading twice a day for the next 2 weeks and then once a day. 05/12/19   Horald Pollen, MD  carvedilol (COREG) 3.125 MG tablet TAKE 1 TABLET (3.125 MG TOTAL) BY MOUTH DAILY. 05/18/19   Sagardia, Ines Bloomer, MD  JANUVIA 50 MG tablet TAKE 1 TABLET (50 MG TOTAL) BY MOUTH DAILY. SCHEDULE APPOINTMENT FOR ADDITIONAL REFILLS. Patient taking differently: Take  50 mg by mouth daily.  05/17/19   Horald Pollen, MD  lamoTRIgine (LAMICTAL) 25 MG tablet 1 tablet twice daily for 2 weeks then take 2 tablets twice daily for 2 weeks, then take 3 tablets twice daily 06/30/19   [name], MD  naproxen (NAPROSYN) 375 MG tablet Take 1 tablet (375 mg total) by mouth 2 (two) times daily. 05/03/19   Wieters, Hallie C, PA-C  torsemide (DEMADEX) 10 MG tablet Take 1 tablet (10 mg total) by mouth daily. 06/21/19 09/19/19  Horald Pollen, MD  Vitamin D, Ergocalciferol, (DRISDOL) 1.25 MG (50000 UT) CAPS capsule Take 1 capsule (50,000 Units total) by mouth every 7 (seven) days. Patient not taking: Reported on 06/30/2019 06/23/19   Horald Pollen, MD    Family History Family History  Problem Relation Age of Onset   Diabetes Mother        passed away from accidental death   Hypertension Mother    Bipolar disorder Father    Diabetes Daughter    Leukemia Daughter    Ovarian cancer Maternal Aunt     Social History Social History   Tobacco Use   Smoking status: Never Smoker   Smokeless tobacco: Never Used  Substance Use Topics   Alcohol use: No   Drug use: No     Allergies   Amoxicillin, Lisinopril, Hydrocodone, and Tegretol [carbamazepine]   Review of Systems Review of Systems  Constitutional: Negative for chills and fever.  HENT: Negative for sore throat.   Eyes: Negative for redness.  Respiratory: Negative for cough and shortness of breath.   Cardiovascular: Negative for chest pain.  Gastrointestinal: Negative for abdominal pain, diarrhea and vomiting.  Genitourinary: Negative for flank pain.  Musculoskeletal: Negative for back pain and neck pain.  Skin: Negative for rash.  Neurological: Negative for headaches.  Hematological: Does not bruise/bleed easily.  Psychiatric/Behavioral: Positive for dysphoric mood.     Physical Exam Updated Vital Signs BP 116/75 (BP Location: Right Arm)    Temp 98.8 F (37.1 C)  (Oral)    Resp 18    Ht 1.549 m ('5\' 1"'$ )    Wt 120.7 kg    SpO2 100%    BMI 50.26 kg/m   Physical Exam Vitals signs and nursing note reviewed.  Constitutional:      Appearance: Normal appearance. She is well-developed.  HENT:     Head: Atraumatic.     Nose: Nose normal.     Mouth/Throat:     Mouth: Mucous membranes are moist.  Eyes:     General: No scleral icterus.    Conjunctiva/sclera: Conjunctivae normal.     Pupils: Pupils are equal, round, and reactive to light.  Neck:     Musculoskeletal: Normal range of motion and neck supple. No neck rigidity or muscular tenderness.     Trachea: No tracheal deviation.  Cardiovascular:     Rate and Rhythm: Normal rate and regular rhythm.  Pulses: Normal pulses.     Heart sounds: Normal heart sounds. No murmur. No friction rub. No gallop.   Pulmonary:     Effort: Pulmonary effort is normal. No respiratory distress.     Breath sounds: Normal breath sounds.  Abdominal:     General: Bowel sounds are normal. There is no distension.     Palpations: Abdomen is soft.     Tenderness: There is no abdominal tenderness. There is no guarding.  Genitourinary:    Comments: No cva tenderness.  Musculoskeletal:        General: No swelling.  Skin:    General: Skin is warm and dry.     Findings: No rash.  Neurological:     Mental Status: She is alert.     Comments: Alert, speech normal. Steady gait.   Psychiatric:     Comments: States feels depressed. No SI.       ED Treatments / Results  Labs (all labs ordered are listed, but only abnormal results are displayed) Results for orders placed or performed during the hospital encounter of 07/14/19  CBC  Result Value Ref Range   WBC 8.1 4.0 - 10.5 K/uL   RBC 3.42 (L) 3.87 - 5.11 MIL/uL   Hemoglobin 10.3 (L) 12.0 - 15.0 g/dL   HCT 34.1 (L) 36.0 - 46.0 %   MCV 99.7 80.0 - 100.0 fL   MCH 30.1 26.0 - 34.0 pg   MCHC 30.2 30.0 - 36.0 g/dL   RDW 14.2 11.5 - 15.5 %   Platelets 242 150 - 400 K/uL     nRBC 0.0 0.0 - 0.2 %  Comprehensive metabolic panel  Result Value Ref Range   Sodium 141 135 - 145 mmol/L   Potassium 3.8 3.5 - 5.1 mmol/L   Chloride 105 98 - 111 mmol/L   CO2 28 22 - 32 mmol/L   Glucose, Bld 118 (H) 70 - 99 mg/dL   BUN 16 6 - 20 mg/dL   Creatinine, Ser 1.24 (H) 0.44 - 1.00 mg/dL   Calcium 8.5 (L) 8.9 - 10.3 mg/dL   Total Protein 6.9 6.5 - 8.1 g/dL   Albumin 3.2 (L) 3.5 - 5.0 g/dL   AST 14 (L) 15 - 41 U/L   ALT 14 0 - 44 U/L   Alkaline Phosphatase 67 38 - 126 U/L   Total Bilirubin 0.2 (L) 0.3 - 1.2 mg/dL   GFR calc non Af Amer 51 (L) >60 mL/min   GFR calc Af Amer 59 (L) >60 mL/min   Anion gap 8 5 - 15  Ethanol  Result Value Ref Range   Alcohol, Ethyl (B) <10 <10 mg/dL   Ct Head Wo Contrast  Result Date: 06/30/2019 CLINICAL DATA:  Seizure, encephalopathy. EXAM: CT HEAD WITHOUT CONTRAST TECHNIQUE: Contiguous axial images were obtained from the base of the skull through the vertex without intravenous contrast. COMPARISON:  05/08/2019 FINDINGS: Brain: No acute intracranial abnormality. Specifically, no hemorrhage, hydrocephalus, mass lesion, acute infarction, or significant intracranial injury. Vascular: No hyperdense vessel or unexpected calcification. Skull: No acute calvarial abnormality. Sinuses/Orbits: Visualized paranasal sinuses and mastoids clear. Orbital soft tissues unremarkable. Other: None IMPRESSION: No acute intracranial abnormality. Electronically Signed   By: Rolm Baptise M.D.   On: 06/30/2019 08:07   Dg Chest Port 1 View  Result Date: 06/15/2019 CLINICAL DATA:  Cough EXAM: PORTABLE CHEST 1 VIEW COMPARISON:  May 14, 2019 FINDINGS: The heart size remains enlarged. There is mild vascular congestion. No pneumothorax. There may be  trace bilateral pleural effusions. There is no acute osseous abnormality. IMPRESSION: Cardiomegaly with mild vascular congestion. Electronically Signed   By: Constance Holster M.D.   On: 06/15/2019 19:56     EKG None  Radiology No results found.  Procedures Procedures (including critical care time)  Medications Ordered in ED Medications - No data to display   Initial Impression / Assessment and Plan / ED Course  I have reviewed the triage vital signs and the nursing notes.  Pertinent labs & imaging results that were available during my care of the patient were reviewed by me and considered in my medical decision making (see chart for details).  Labs sent.   Reviewed nursing notes and prior charts for additional history. Recent eval by her psychiatrist - expressed feelings of depression then.   Will get [facility] consult. ?whether possible outpt program and or counseling.   Labs reviewed by me - hgb c/w prior, lytes normal. etoh neg.  BH eval pending.   Patient is medically clear for BH eval and disposition.   Disposition per [facility] team.   See [facility] note below - psychiatry team has evaluated, and indicates psych clear for d/c to home, f/u arranged.   Per Buford Dresser, DO, this pt does not require psychiatric hospitalization at this time.  Pt is to be discharged from [facility] with outpatient referrals.  Pt is a current client of the [facility] [place], where she sees Norma Fredrickson, MD.  Her next appointment is scheduled for Wednesday 09/08/2019 at 15:00.  This has been included in pt's discharge instructions, along with a recommendation to call them at her earliest opportunity to schedule a therapy appointment.  Final Clinical Impressions(s) / ED Diagnoses   Final diagnoses:  None    ED Discharge Orders    None           Lajean Saver, MD 07/14/19 [phone]

## 2019-07-14 NOTE — BH Assessment (Signed)
Tele Assessment Note   Patient Name: Brandi Gomez MRN: [id number] Referring Physician: Dr. Ashok Cordia Location of Patient: [place] Location of Provider: [place] is an 48 y.o. female. Pt denies SI/HI and AVH. Pt came to the ED reporting concerns about her seizures and seizure medications. Pt also reported depression. Pt states she is depressed because her last seizure was "really bad" and because her current living environment makes it difficult for her to live a [place] life. Pt recently began seeing Dr. Casimiro Needle. Pt denies previous SI attempts. Pt denies previous hospitalizations. Pt is seeking [place] counseling.   Dr. Mariea Clonts recommends D/C with resources.  Diagnosis:  F33.1 MDD  Past Medical History:  Past Medical History:  Diagnosis Date  . Anemia   . Anxiety   . Bipolar 1 disorder ([place])   . Common migraine 05/19/2015  . Depression   . Diabetes mellitus, type II ([place])   . Hypertension   . Irritable bowel syndrome (IBS)   . Mild mental retardation   . Obesity   . Partial complex seizure disorder with intractable epilepsy ([place]) 05/12/2014  . Seizures ([place])    intractable, sz 08/23/17  . Sleep apnea   . Stroke ([place])   . Type II or unspecified type diabetes mellitus without mention of complication, not stated as uncontrolled     Past Surgical History:  Procedure Laterality Date  . COLONOSCOPY     2012-normal , Dr Sharlett Iles  . ESOPHAGOGASTRODUODENOSCOPY     normal-Dr Patterson 2012  . LOOP RECORDER INSERTION N/A 05/30/2017   Procedure: Loop Recorder Insertion;  Surgeon: Constance Haw, MD;  Location: [place] [facility];  Service: Cardiovascular;  Laterality: N/A;  . LOOP RECORDER REMOVAL N/A 03/04/2018   Procedure: LOOP RECORDER REMOVAL;  Surgeon: Constance Haw, MD;  Location: [place] [facility];  Service: Cardiovascular;  Laterality: N/A;  . MYRINGOTOMY WITH TUBE PLACEMENT    . MYRINGOTOMY WITH TUBE PLACEMENT Right  11/05/2017   Procedure: MYRINGOTOMY WITH TUBE PLACEMENT;  Surgeon: Melissa Montane, MD;  Location: [place];  Service: ENT;  Laterality: Right;  right T Tube placement  . NASAL SINUS SURGERY    . TEE WITHOUT CARDIOVERSION N/A 05/30/2017   Procedure: TRANSESOPHAGEAL ECHOCARDIOGRAM (TEE);  Surgeon: Acie Fredrickson Wonda Cheng, MD;  Location: [facility] ENDOSCOPY;  Service: Cardiovascular;  Laterality: N/A;    Family History:  Family History  Problem Relation Age of Onset  . Diabetes Mother        passed away from accidental death  . Hypertension Mother   . Bipolar disorder Father   . Diabetes Daughter   . Leukemia Daughter   . Ovarian cancer Maternal Aunt     Social History:  reports that she has never smoked. She has never used smokeless tobacco. She reports that she does not drink alcohol or use drugs.  Additional Social History:  Alcohol / Drug Use Pain Medications: please see mar Prescriptions: please see mar Over the Counter: please see mar History of alcohol / drug use?: No history of alcohol / drug abuse  CIWA: CIWA-Ar BP: 116/75 COWS:    Allergies:  Allergies  Allergen Reactions  . Amoxicillin Itching    Has patient had a PCN reaction causing immediate rash, facial/tongue/throat swelling, SOB or lightheadedness with hypotension: yes Has patient had a PCN reaction causing severe rash involving mucus membranes or skin necrosis: no Has patient had a PCN reaction that required hospitalization: no Has patient had a PCN reaction  occurring within the last 10 years: yes If all of the above answers are "NO", then may proceed with Cephalosporin use.  Marland Kitchen Lisinopril Swelling    Angioedema   . Hydrocodone Other (See Comments)    Depressed   . Tegretol [Carbamazepine] Swelling    Throat swells    Home Medications: (Not in a hospital admission)   OB/GYN Status:  No LMP recorded. Patient is perimenopausal.  General Assessment Data [facility] of Assessment: [facility] TTS Assessment: In system Is this a  Tele or Face-to-Face Assessment?: Tele Assessment Is this an Initial Assessment or a Re-assessment for this encounter?: Initial Assessment Patient Accompanied by:: N/A Language Other than English: No Living Arrangements: Other (Comment) What gender do you identify as?: Female Marital status: Single Maiden name: NA Pregnancy Status: No Living Arrangements: Children, Spouse/significant other Can pt return to current living arrangement?: Yes Admission Status: Voluntary Is patient capable of signing voluntary admission?: Yes Referral Source: Self/Family/Friend Insurance type: [place] Living Arrangements: Children, Spouse/significant other Legal Guardian: Other:(self) Name of Psychiatrist: NA Name of Therapist: NA  Education Status Is patient currently in school?: No Is the patient employed, unemployed or receiving disability?: Receiving disability income  Risk to self with the past 6 months Suicidal Ideation: No Has patient been a risk to self within the past 6 months prior to admission? : No Suicidal Intent: No Has patient had any suicidal intent within the past 6 months prior to admission? : No Is patient at risk for suicide?: No Suicidal Plan?: No Has patient had any suicidal plan within the past 6 months prior to admission? : No Access to Means: No What has been your use of drugs/alcohol within the last 12 months?: Pt denies Previous Attempts/Gestures: No How many times?: 0 Other Self Harm Risks: NA Triggers for Past Attempts: None known Intentional Self Injurious Behavior: None Family Suicide History: No Recent stressful life event(s): Conflict (Comment) Persecutory voices/beliefs?: No Depression: Yes Depression Symptoms: Feeling worthless/self pity, Feeling angry/irritable, Tearfulness, Isolating Substance abuse history and/or treatment for substance abuse?: No Suicide prevention information given to non-admitted patients: Not applicable  Risk  to Others within the past 6 months Homicidal Ideation: No Does patient have any lifetime risk of violence toward others beyond the six months prior to admission? : No Thoughts of Harm to Others: No Current Homicidal Intent: No Current Homicidal Plan: No Access to Homicidal Means: No Identified Victim: NA History of harm to others?: No Assessment of Violence: None Noted Violent Behavior Description: NA Does patient have access to weapons?: No Criminal Charges Pending?: No Does patient have a court date: No Is patient on probation?: No  Psychosis Hallucinations: None noted Delusions: None noted  Mental Status Report Appearance/Hygiene: In scrubs Eye Contact: Good Motor Activity: Freedom of movement Speech: Slow Level of Consciousness: Quiet/awake Mood: Anxious Affect: Anxious Anxiety Level: Minimal Thought Processes: Coherent, Relevant Judgement: Unimpaired Orientation: Person, Place, Time, Situation Obsessive Compulsive Thoughts/Behaviors: None  Cognitive Functioning Concentration: Normal Memory: Recent Intact, Remote Intact Is patient IDD: No Insight: Fair Impulse Control: Fair Appetite: Fair Have you had any weight changes? : No Change Sleep: No Change Total Hours of Sleep: 8 Vegetative Symptoms: None  ADLScreening [facility] Assessment Services) Patient's cognitive ability adequate to safely complete daily activities?: Yes Patient able to express need for assistance with ADLs?: Yes Independently performs ADLs?: Yes (appropriate for developmental age)  Prior Inpatient Therapy Prior Inpatient Therapy: No  Prior Outpatient Therapy Prior Outpatient Therapy: Yes  Prior Therapy Dates: current Prior Therapy Facilty/Provider(s): Dr. Casimiro Needle Reason for Treatment: depression Does patient have an ACCT team?: No Does patient have Intensive In-House Services?  : No Does patient have [facility] services? : No Does patient have P4CC services?: No  ADL Screening (condition at  time of admission) Patient's cognitive ability adequate to safely complete daily activities?: Yes Is the patient deaf or have difficulty hearing?: No Does the patient have difficulty seeing, even when wearing glasses/contacts?: No Does the patient have difficulty concentrating, remembering, or making decisions?: No Patient able to express need for assistance with ADLs?: Yes Does the patient have difficulty dressing or bathing?: No Independently performs ADLs?: Yes (appropriate for developmental age)       Abuse/Neglect Assessment (Assessment to be complete while patient is alone) Abuse/Neglect Assessment Can Be Completed: Yes Physical Abuse: Denies Verbal Abuse: Denies Sexual Abuse: Denies Exploitation of patient/patient's resources: Denies     Regulatory affairs officer (For Healthcare) Does Patient Have a Medical Advance Directive?: No Would patient like information on creating a medical advance directive?: No - Patient declined          Disposition:  Disposition Initial Assessment Completed for this Encounter: Yes Patient referred to: Other (Comment)  This service was provided via telemedicine using a 2-way, interactive audio and video technology.  Names of all persons participating in this telemedicine service and their role in this encounter. Name: Leilani Merl Role: Physician  Name:  Role:   Name:  Role:   Name:  Role:     Uchechukwu Dhawan D 07/14/2019 1:10 PM

## 2019-07-14 NOTE — Progress Notes (Signed)
I have read the note, and I agree with the clinical assessment and plan.  Brandi Gomez   

## 2019-07-14 NOTE — ED Triage Notes (Signed)
Pt reports increasing depression after her last seizure which happened while she was here during her last visit one month ago.  She feels that the seizure medications might be changing her personality.  "I feel like a different person."  She does not remember the name of the medication. Gets meds filled at [facility] on [address].

## 2019-07-14 NOTE — ED Notes (Signed)
Pt discharged home. Discharged instructions read to pt who verbalized understanding. All belongings returned to pt. Denies SI/HI, is not delusional and not responding to internal stimuli. Escorted pt to the ED exit.   

## 2019-07-15 ENCOUNTER — Ambulatory Visit (HOSPITAL_COMMUNITY)
Admission: RE | Admit: 2019-07-15 | Discharge: 2019-07-15 | Disposition: A | Discharge status: Home / Self Care | Payer: Medicare HMO | Attending: Psychiatry | Admitting: Psychiatry

## 2019-07-15 ENCOUNTER — Other Ambulatory Visit: Payer: Self-pay | Admitting: *Deleted

## 2019-07-15 ENCOUNTER — Telehealth (HOSPITAL_COMMUNITY): Payer: Self-pay

## 2019-07-15 DIAGNOSIS — G4730 Sleep apnea, unspecified: Secondary | ICD-10-CM | POA: Insufficient documentation

## 2019-07-15 DIAGNOSIS — Z833 Family history of diabetes mellitus: Secondary | ICD-10-CM | POA: Insufficient documentation

## 2019-07-15 DIAGNOSIS — Z885 Allergy status to narcotic agent status: Secondary | ICD-10-CM | POA: Diagnosis not present

## 2019-07-15 DIAGNOSIS — Z8673 Personal history of transient ischemic attack (TIA), and cerebral infarction without residual deficits: Secondary | ICD-10-CM | POA: Insufficient documentation

## 2019-07-15 DIAGNOSIS — F419 Anxiety disorder, unspecified: Secondary | ICD-10-CM | POA: Insufficient documentation

## 2019-07-15 DIAGNOSIS — E119 Type 2 diabetes mellitus without complications: Secondary | ICD-10-CM | POA: Diagnosis not present

## 2019-07-15 DIAGNOSIS — Z888 Allergy status to other drugs, medicaments and biological substances status: Secondary | ICD-10-CM | POA: Diagnosis not present

## 2019-07-15 DIAGNOSIS — F319 Bipolar disorder, unspecified: Secondary | ICD-10-CM | POA: Insufficient documentation

## 2019-07-15 DIAGNOSIS — E669 Obesity, unspecified: Secondary | ICD-10-CM | POA: Insufficient documentation

## 2019-07-15 DIAGNOSIS — Z806 Family history of leukemia: Secondary | ICD-10-CM | POA: Insufficient documentation

## 2019-07-15 DIAGNOSIS — Z881 Allergy status to other antibiotic agents status: Secondary | ICD-10-CM | POA: Insufficient documentation

## 2019-07-15 DIAGNOSIS — I10 Essential (primary) hypertension: Secondary | ICD-10-CM | POA: Insufficient documentation

## 2019-07-15 DIAGNOSIS — F341 Dysthymic disorder: Secondary | ICD-10-CM | POA: Insufficient documentation

## 2019-07-15 DIAGNOSIS — Z8249 Family history of ischemic heart disease and other diseases of the circulatory system: Secondary | ICD-10-CM | POA: Diagnosis not present

## 2019-07-15 DIAGNOSIS — Z818 Family history of other mental and behavioral disorders: Secondary | ICD-10-CM | POA: Diagnosis not present

## 2019-07-15 DIAGNOSIS — Z8041 Family history of malignant neoplasm of ovary: Secondary | ICD-10-CM | POA: Diagnosis not present

## 2019-07-15 NOTE — Patient Outreach (Signed)
[place] [facility]) Care Management  07/15/2019  Brandi Gomez 1971/08/04 [id number]    Referral received 07/15/2019 Initial Outreach 07/15/2019 Primary provider to completed transition of care  Telephone Assessment-Unsuccessful  RN attempted outreach however unsuccessful. RN able to leave a HIPAA approved voice message requesting a call back. Will further engage at that time.  Will attempted another outreach call with in the next 4 business days.  Brandi Mina, RN Care Management Coordinator [place] Office [phone]
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
## 2019-07-15 NOTE — H&P (Signed)
Behavioral Health Medical Screening Exam  Brandi Gomez is an 48 y.o. female.who presents to [facility] as a walk-in, voluntarily, accompanied alone. Patient was seen at [facility] ED yesterday for a similar presentation. She endorses she is depressed and overwhelmed with her current living situation as she is living with her daughter and her daughters father. She endorses her daughter who is 67, has behavioral, is causing her to feel overwhelmed on top of her health issues. She reports she has seizures and memory loss. along with other health conditions. She denies any suicidal ideations although states that she feels as if her living situation does not get better, " I might as well take some pills."  She denies any plan of suicide.  She denies homicidal or psychosis. Denies concerns with sleep or appetite.  She reports she was told by a provider yesterday that an assisted living may be more beneficial and she was provided with resources to call assisted living faculties. She states she needs these services to assist her with medications as she sometimes forget to take them. She states it has been difficult for her to follow-up with the faculties as she becomes confused. She does have outpatient psychiatry at [facility] at [place], where she sees Norma Fredrickson, MD. At this time, pt does not require psychiatric hospitalization at this time. I spoke with CSW here on the unit and it appears that she has [facility]. She was advised to contact [facility] to seek advise with finding an independent living community.    Total Time spent with patient: 20 minutes  Psychiatric Specialty Exam: Physical Exam  Vitals reviewed. Constitutional: She is oriented to person, place, and time.  Neurological: She is alert and oriented to person, place, and time.    Review of Systems  Psychiatric/Behavioral: Positive for depression. Negative for hallucinations, memory loss, substance abuse and  suicidal ideas. The patient is not nervous/anxious and does not have insomnia.   All other systems reviewed and are negative.   There were no vitals taken for this visit.There is no height or weight on file to calculate BMI.  General Appearance: Fairly Groomed  Eye Contact:  Good  Speech:  Clear and Coherent and Normal Rate  Volume:  Normal  Mood:  Depressed  Affect:  Congruent  Thought Process:  Coherent, Linear and Descriptions of Associations: Intact  Orientation:  Full (Time, Place, and Person)  Thought Content:  WDL  Suicidal Thoughts:  No  Homicidal Thoughts:  No  Memory:  Immediate;   Fair Recent;   Fair  Judgement:  Fair  Insight:  Fair  Psychomotor Activity:  Normal  Concentration: Concentration: Fair and Attention Span: Fair  Recall:  [employer] of Knowledge:Fair  Language: Good  Akathisia:  Negative  Handed:  Right  AIMS (if indicated):     Assets:  Communication Skills Desire for Improvement Resilience Social Support  Sleep:       Musculoskeletal: Strength & Muscle Tone: within normal limits Gait & Station: normal Patient leans: N/A  There were no vitals taken for this visit.  Recommendations:  Based on my evaluation the patient does not appear to have an emergency medical condition.   No evidence of imminent risk to self or others at present.   Patient does not meet criteria for psychiatric inpatient admission. Reccommended to continue follow-up with her outpatient team..Reecommedned to follow-up with previous resources provided and with [facility] to assist her on independent living communities.  [name], NP 07/15/2019, 3:12 PM

## 2019-07-15 NOTE — Telephone Encounter (Signed)
Patient arrived at my office today, she was at the ED yesterday and was not admitted. Patient states that she is worse today and having thoughts of hurting herself. Patient states that her home life is not good and that she needs help. I advised patient to go to [facility] for an eval. Patient has her fiancee with her and he agreed to take her up to the hospital. I advised patient to speak with the social workers there to see if they can advise her on independent living communities.

## 2019-07-16 DIAGNOSIS — M545 Low back pain: Secondary | ICD-10-CM | POA: Diagnosis not present

## 2019-07-16 DIAGNOSIS — R569 Unspecified convulsions: Secondary | ICD-10-CM | POA: Diagnosis not present

## 2019-07-16 DIAGNOSIS — G4733 Obstructive sleep apnea (adult) (pediatric): Secondary | ICD-10-CM | POA: Diagnosis not present

## 2019-07-19 ENCOUNTER — Other Ambulatory Visit: Payer: Self-pay | Admitting: *Deleted

## 2019-07-19 NOTE — Patient Outreach (Signed)
[facility]) Care Management  07/19/2019  LAZARIAH SAVARD 1971/03/18 [id number]    Referral received 07/15/2019 Incoming call 07/19/2019  Telephone Assessment  RN spoke with pt today and introduced [facility] services and the purpose for the call earlier last week. Pt returned the call today and RN further engaged on pt's possible needs. Pt states she has spoken with the out-pt RN at the clinic who has informed her of a office visit with Dr. Casimiro Needle ([place]) on 9/30. RN inquired if a sooner appointments was needed however pt "okay" with the one in place. Several issues discussed today for [facility] services as followed:  Housing- Pt states she is seeking another residence due to her current relationship with the people she is currently living with at this time. States this living situation is stressing her out. States she is seeking assisted living and not long term care at this time. Continue to want her independence with her living arrangement. RN offered to refer to [facility] social worker to assist with available resources for this requested (pt receptive). Also mentioned counseling with the involved social worker if needed. Medications-Pt states she takes her medications with no problems with assisted from her artistic daughter. States the pill box does not work for her and she currently takes directly from the bottle.  RN inquired further and offered pharmacy to assist for a more organized way for medication administration (pt receptive).  Diabetes- Pt states she does not check her glucose due to meter issues. States she has a meter but no strips or needles for continuous monitoring. RN offered to call her provider however needed the name of the new glucose device. Pt states she will find the device and call RN case manager back later today with the device name for the requested accessories. Last A1c 6.1 % on 06/21/2019.   RN will further discuss [facility] involvement via RN case management  services on the return call for improving her ongoing management of care. Will gather additional information upon the call back for RN to assist further.   Addendum: No call back received as RN will follow up tomorrow with another telephone assessment to continue to assist pt at needed. Note referral placed for social worker and pharmacy via [facility] services.  [name], RN Care Management Coordinator [place] Office [phone]

## 2019-07-20 ENCOUNTER — Ambulatory Visit: Payer: Self-pay | Admitting: *Deleted

## 2019-07-20 ENCOUNTER — Other Ambulatory Visit: Payer: Self-pay | Admitting: Pharmacist

## 2019-07-20 NOTE — Patient Outreach (Signed)
[facility]) Care Management  [place]   07/20/2019  Brandi Gomez January 11, 1971 [id number]  Reason for referral: Medication Management (medication organization)  Referral source: [facility] RN Current insurance: [employer]  PMHx includes but not limited to: seizure disorder, bipolar 1 disorder, T2DM, IBS, hx stroke, obesity, mild mental retardation  Outreach:  Successful telephone call with patient.  HIPAA identifiers verified.   Subjective:  Patient reports she has all her medication bottles in a bag and her daughter gives them to her each day. Daughter lives with patient.  Daughter able to speak with me over the phone and review medications telephonically.   Objective: The ASCVD Risk score Brandi Gomez., et al., 2013) failed to calculate for the following reasons:   The patient has a prior MI or stroke diagnosis  Lab Results  Component Value Date   CREATININE 1.24 (H) 07/14/2019   CREATININE 1.16 (H) 06/30/2019   CREATININE 1.46 (H) 06/21/2019    Lab Results  Component Value Date   HGBA1C 6.1 (H) 06/21/2019    Lipid Panel     Component Value Date/Time   CHOL 158 05/21/2017 0506   TRIG 161 (H) 05/21/2017 0506   HDL 44 05/21/2017 0506   CHOLHDL 3.6 05/21/2017 0506   VLDL 32 05/21/2017 0506   LDLCALC 82 05/21/2017 0506    BP Readings from Last 3 Encounters:  07/14/19 (!) 151/79  07/14/19 111/65  06/30/19 (!) 143/84    Allergies  Allergen Reactions  . Amoxicillin Itching    Has patient had a PCN reaction causing immediate rash, facial/tongue/throat swelling, SOB or lightheadedness with hypotension: yes Has patient had a PCN reaction causing severe rash involving mucus membranes or skin necrosis: no Has patient had a PCN reaction that required hospitalization: no Has patient had a PCN reaction occurring within the last 10 years: yes If all of the above answers are "NO", then may proceed with Cephalosporin use.  Marland Kitchen Lisinopril Swelling     Angioedema   . Hydrocodone Other (See Comments)    Depressed   . Tegretol [Carbamazepine] Swelling    Throat swells    Medications Reviewed Today    Reviewed by [name], NP (Nurse Practitioner) on 07/14/19 at 1214  Med List Status: <None>  Medication Order Taking? Sig Documenting Provider Last Dose Status Informant  ARIPiprazole (ABILIFY) 5 MG tablet [id number] Yes Take 5 mg by mouth daily. [provider] Taking Active   atorvastatin (LIPITOR) 10 MG tablet [id number] Yes Take 1 tablet (10 mg total) by mouth daily at 6 PM. Caldwell, Ines Bloomer, MD Taking Active Pharmacy Records           Med Note [name] Jun 30, 2019 10:08 AM) 90 day supply Last filled 04/19/2019  Blood Glucose Monitoring Suppl KIT [id number] Yes Please supply kit covered by insurance and supplies for testing four times daily. E11.9 E10.9 Horald Pollen, MD Taking Active Pharmacy Records           Med Note [name], [name]   Mon Jun 21, 2019  3:00 PM) use  Blood Pressure Monitor KIT [id number] Yes Take blood pressure reading twice a day for the next 2 weeks and then once a day. Horald Pollen, MD Taking Active Pharmacy Records           Med Note [name], Caren Griffins A   Mon Jun 21, 2019  3:00 PM) use  carvedilol (COREG) 3.125 MG tablet [id number] Yes TAKE  1 TABLET (3.125 MG TOTAL) BY MOUTH DAILY. Horald Pollen, MD Taking Active Pharmacy Records           Med Note [name], [name] Jun 30, 2019 10:10 AM) 30 day supply Last filled 06/17/2019  JANUVIA 50 MG tablet [id number] Yes TAKE 1 TABLET (50 MG TOTAL) BY MOUTH DAILY. SCHEDULE APPOINTMENT FOR ADDITIONAL REFILLS.  Patient taking differently: Take 50 mg by mouth daily.    Horald Pollen, MD Taking Active Pharmacy Records           Med Note [name], [name]   Wed Jun 30, 2019 10:13 AM) 90 day supply Last filled 05/17/2019  lamoTRIgine (LAMICTAL) 25 MG tablet [id number] Yes 1 tablet twice daily for 2 weeks then take 2  tablets twice daily for 2 weeks, then take 3 tablets twice daily [name], MD Taking Active   naproxen (NAPROSYN) 375 MG tablet [id number] Yes Take 1 tablet (375 mg total) by mouth 2 (two) times daily. Wieters, [name], PA-C Taking Active Pharmacy Records  torsemide (DEMADEX) 10 MG tablet [id number] Yes Take 1 tablet (10 mg total) by mouth daily. Horald Pollen, MD Taking Active Pharmacy Records           Med Note [name], [name] Jun 30, 2019 10:17 AM) 90 day supply Last filled 06/21/2019  Vitamin D, Ergocalciferol, (DRISDOL) 1.25 MG (50000 UT) CAPS capsule [id number] No Take 1 capsule (50,000 Units total) by mouth every 7 (seven) days.  Patient not taking: Reported on 06/30/2019   Horald Pollen, MD Not Taking Active Pharmacy Records           Med Note [name], [name] Jun 30, 2019 10:18 AM) No dispense records from pharmacy  Med List Note [name], [name] 04/17/19 0405): [name] helps care for pt's meds [phone]) or [phone] ---pt gave verbal consent to speak with these two pts about medications on 04/12/18 7:17pm          Assessment: Drugs sorted by system:  Neurologic/Psychologic: aripiprazole, lamotrigine  Cardiovascular: atorvastatin, torsemide, carvedilol  Endocrine: Januvia  Vitamins/Minerals/Supplements: vitamin D  Medication Review Findings:  . Lamotrigine: still only taking 1 tablet BID, reviewed to increase dose per titration schedule instructions . Vitamin D - needs refill, patient will request this  . CBG:  Patient states she is going to start checking TID, reports she has the test strips and lancets now, no issues with glucometer . Medication Adherence:  Reviewed several strategies for adherence including pillboxes, marking bottle caps with 1x or 2x daily stickers, alarms, calendars, and compliance packs.  Daughter and patient state current system is working well for them at this time.    . Provided my phone number to patient if she needs to contact me in the future  Medication Assistance Findings:  No medication assistance needs identified   Plan: . Will close [facility] pharmacy case as no further medication needs identified at this time.  Am happy to assist in the future as needed.     Ralene Bathe, PharmD, [place] [phone]

## 2019-07-21 ENCOUNTER — Other Ambulatory Visit: Payer: Self-pay | Admitting: *Deleted

## 2019-07-21 NOTE — Patient Outreach (Signed)
[facility]) Care Management  07/21/2019  JANNELY HENTHORN 19-Oct-1971 [id number]  Telephone Assessment-Diabetes   RN returned a call to pt today and inquired further on services with [facility]. Pt receptive and RN proceeded with enrolling pt into the [facility] program and services. Pt has indicated she found supplies for her blood sugar monitoring and no long needs assistance with these supplies. Pt has not started but did not know to complete her blood sugars prior to her meals. Pt is aware to completed three checks a day for her blood sugars. RN requested pt to document all readings for her providers to view. Will also mail out printed material along with a [facility] calendar for document her readings. Discussed diabetes and the importance of monitoring this condition due to the damage it can cause if unmonitored with related vision, kidney, wounds and other life threatening conditions. Pt verbalized an understanding and was receptive to all discussed today. A plan of care along with goals and interventions generated based upon pt's lack of knowledge related to this condition. Discussed pt's responsibility with daily monitoring, adherence with her medical appointments due to her recent hospitalization.   Pt had to cut today's conversation short indicating she needed to eat and requested a call back later this week. RN will further discuss pt's history and completed the assessment accordingly. Will also contact pt's provider concerning pt's disposition with [facility] services and send a barriers letter. Will also print EMMI for mail out to discuss further with pt as ongoing education will continue.  THN CM Care Plan Problem One     Most Recent Value  Care Plan Problem One  Deficient knowledge related to diabetes management  Role Documenting the Problem One  Care Management Coordinator  Care Plan for Problem One  Active  THN Long Term Goal   Pt will verbalize two symptoms of hypo-hyperglycemia with in  the next 90 days.  THN Long Term Goal Start Date  07/21/19  Interventions for Problem One Long Term Goal  Will send and discuss printed material to further educate pt on diabetes and the risk involved if unmonitored. Will request pt to documet all readings for her providers to view for ongoing management of care.  THN CM Short Term Goal #1   Pt will monitor her BS daily and keep a log for the next 30 days.  THN CM Short Term Goal #1 Start Date  07/21/19  Interventions for Short Term Goal #1  Will discus how and when to monitor blood sugars with meals, Will stress the importance of when to report acute readings to her provider for the appropiate coverage    [facility] CM Care Plan Problem Two     Most Recent Value  Care Plan Problem Two  Adherence to all medical appointments post hospital discharge  Role Documenting the Problem Two  Care Management [place] for Problem Two  Active  THN CM Short Term Goal #1   Pt will attend all medical appointments with providers in the next 30 days.  THN CM Short Term Goal #1 Start Date  07/21/19  Interventions for Short Term Goal #2   Will verify sources of transportation and provider available resources for transport services if needed. Will stress the importanc eof attendance for ongoing management of care.      [name], RN Care Management Coordinator [place] Office [phone]

## 2019-07-23 ENCOUNTER — Other Ambulatory Visit: Payer: Self-pay | Admitting: *Deleted

## 2019-07-23 ENCOUNTER — Encounter: Payer: Self-pay | Admitting: *Deleted

## 2019-07-23 NOTE — Patient Outreach (Signed)
[place] [facility]) Care Management  07/23/2019  Brandi Gomez [birth year] [id number]   [name] made initial contact with pt who confirmed her identity. CSW introduced self, role and reason for call. Pt reports living with a daughter and a friend. "I have been living with someone for years and I want to live a [place] life and cannot do that if living with someone".  She denies any abuse/threats or concerns related to her current housing and yet wants to get her own place.  CSW discussed possible options with her for housing to include ALF, Senior apartments, etc.  She seems most interested in pursuing her own home/apartment and CSW will mail local resource options to her to review and consider.  "I pay all the rent and they pay the other bills".    CSW will plan a follow up call to pt in 7-10 business days for further assistance/guidance and support.   Eduard Clos, MSW, [place] Worker  [place] [phone]

## 2019-07-23 NOTE — Patient Outreach (Signed)
[facility]) Care Management  07/23/2019  Brandi Gomez 1971-07-16 [id number]    Telephone Assessment  RN scheduled a call back for today to completed initial assessment however unsuccessful. RN able to leave a HIPAA approved voice message requesting a call back.   Will continue to follow up with another call next week. Note enrollment and plan of care has been established.  [name], RN Care Management Coordinator [place] Office [phone]

## 2019-07-26 ENCOUNTER — Encounter (HOSPITAL_COMMUNITY): Payer: Self-pay | Admitting: Emergency Medicine

## 2019-07-26 ENCOUNTER — Other Ambulatory Visit: Payer: Self-pay

## 2019-07-26 ENCOUNTER — Ambulatory Visit (HOSPITAL_COMMUNITY)
Admission: EM | Admit: 2019-07-26 | Discharge: 2019-07-26 | Disposition: A | Discharge status: Home / Self Care | Payer: Medicare HMO | Attending: Family Medicine | Admitting: Family Medicine

## 2019-07-26 DIAGNOSIS — K121 Other forms of stomatitis: Secondary | ICD-10-CM

## 2019-07-26 MED ORDER — TRIAMCINOLONE ACETONIDE 0.1 % MT PSTE: 1.0000 "application " | PASTE | Freq: Two times a day (BID) | OROMUCOSAL | 0 refills | Status: DC

## 2019-07-26 NOTE — ED Triage Notes (Signed)
Pt presents to [facility] for assessment lower right lip swelling and tongue swelling since waking up this morning.  Denies known injury.  Swelling to right lower lip, and rash of some sort to left side of tongue.  Denies SOB, difficulty breathing or swallowing.

## 2019-07-26 NOTE — ED Provider Notes (Signed)
[place]    CSN: [id number] Arrival date & time: 07/26/19  1851     History   Chief Complaint Chief Complaint  Patient presents with  . Oral Swelling    HPI Brandi Gomez is a 48 y.o. female.   Brandi Gomez presents with complaints of swelling to her lower lip. She woke with it swollen as well with some soreness to lateral aspect of tongue. Mild soreness to inside of lip. Hasn't increased in size. Denies any previous similar. Applied ice which hasn't helped. Doesn't itch or burn. No URI symptoms. No known bite. No throat involvement or other face involvement. Denies history  Of cold sores. History  Of anxiety, bipolar, depression, dm, seizures, stroke.     ROS per HPI, negative if not otherwise mentioned.      Past Medical History:  Diagnosis Date  . Anemia   . Anxiety   . Bipolar 1 disorder ([place])   . Common migraine 05/19/2015  . Depression   . Diabetes mellitus, type II ([place])   . Hypertension   . Irritable bowel syndrome (IBS)   . Mild mental retardation   . Obesity   . Partial complex seizure disorder with intractable epilepsy ([place]) 05/12/2014  . Seizures ([place])    intractable, sz 08/23/17  . Sleep apnea   . Stroke ([place])   . Type II or unspecified type diabetes mellitus without mention of complication, not stated as uncontrolled     Patient Active Problem List   Diagnosis Date Noted  . Depression 07/14/2019  . Bipolar disorder ([place]) 05/13/2019  . Bipolar 1 disorder ([place]) 05/13/2019  . Adjustment disorder with anxiety   . History of recent stroke 08/06/2017  . OSA (obstructive sleep apnea)   . Cryptogenic stroke ([place]) 05/20/2017  . Controlled type 2 diabetes mellitus without complication, without long-term current use of insulin ([place]) 05/08/2017  . Essential hypertension 07/27/2008  . Seizure disorder ([place]) 02/05/2007    Past Surgical History:  Procedure Laterality Date  . COLONOSCOPY     2012-normal , Dr Sharlett Iles  .  ESOPHAGOGASTRODUODENOSCOPY     normal-Dr Patterson 2012  . LOOP RECORDER INSERTION N/A 05/30/2017   Procedure: Loop Recorder Insertion;  Surgeon: Constance Haw, MD;  Location: [place] [facility];  Service: Cardiovascular;  Laterality: N/A;  . LOOP RECORDER REMOVAL N/A 03/04/2018   Procedure: LOOP RECORDER REMOVAL;  Surgeon: Constance Haw, MD;  Location: [place] [facility];  Service: Cardiovascular;  Laterality: N/A;  . MYRINGOTOMY WITH TUBE PLACEMENT    . MYRINGOTOMY WITH TUBE PLACEMENT Right 11/05/2017   Procedure: MYRINGOTOMY WITH TUBE PLACEMENT;  Surgeon: Melissa Montane, MD;  Location: [place];  Service: ENT;  Laterality: Right;  right T Tube placement  . NASAL SINUS SURGERY    . TEE WITHOUT CARDIOVERSION N/A 05/30/2017   Procedure: TRANSESOPHAGEAL ECHOCARDIOGRAM (TEE);  Surgeon: Acie Fredrickson Wonda Cheng, MD;  Location: [facility] ENDOSCOPY;  Service: Cardiovascular;  Laterality: N/A;    OB History   No obstetric history on file.      Home Medications    Prior to Admission medications   Medication Sig Start Date End Date Taking? Authorizing Provider  ARIPiprazole (ABILIFY) 5 MG tablet Take 5 mg by mouth daily.    [provider]  atorvastatin (LIPITOR) 10 MG tablet Take 1 tablet (10 mg total) by mouth daily at 6 PM. 05/12/19 08/10/19  Horald Pollen, MD  Blood Glucose Monitoring Suppl KIT Please supply kit covered by insurance  and supplies for testing four times daily. E11.9 E10.9 05/12/19   Horald Pollen, MD  Blood Pressure Monitor KIT Take blood pressure reading twice a day for the next 2 weeks and then once a day. 05/12/19   Horald Pollen, MD  carvedilol (COREG) 3.125 MG tablet TAKE 1 TABLET (3.125 MG TOTAL) BY MOUTH DAILY. 05/18/19   Sagardia, Ines Bloom[place], MD  JANUVIA 50 MG tablet TAKE 1 TABLET (50 MG TOTAL) BY MOUTH DAILY. SCHEDULE APPOINTMENT FOR ADDITIONAL REFILLS. Patient taking differently: Take 50 mg by mouth daily.  05/17/19   Horald Pollen, MD   lamoTRIgine (LAMICTAL) 25 MG tablet 1 tablet twice daily for 2 weeks then take 2 tablets twice daily for 2 weeks, then take 3 tablets twice daily 06/30/19   [name], MD  torsemide (DEMADEX) 10 MG tablet Take 1 tablet (10 mg total) by mouth daily. 06/21/19 09/19/19  Horald Pollen, MD  triamcinolone (KENALOG) 0.1 % paste Use as directed 1 application in the mouth or throat 2 (two) times daily. 07/26/19   Zigmund Gottron, NP  Vitamin D, Ergocalciferol, (DRISDOL) 1.25 MG (50000 UT) CAPS capsule Take 1 capsule (50,000 Units total) by mouth every 7 (seven) days. 06/23/19   Horald Pollen, MD    Family History Family History  Problem Relation Age of Onset  . Diabetes Mother        passed away from accidental death  . Hypertension Mother   . Bipolar disorder Father   . Diabetes Daughter   . Leukemia Daughter   . Ovarian cancer Maternal Aunt     Social History Social History   Tobacco Use  . Smoking status: Never Smoker  . Smokeless tobacco: Never Used  Substance Use Topics  . Alcohol use: No  . Drug use: No     Allergies   Amoxicillin, Lisinopril, Hydrocodone, and Tegretol [carbamazepine]   Review of Systems Review of Systems   Physical Exam Triage Vital Signs ED Triage Vitals  Enc Vitals Group     BP 07/26/19 1926 (!) 143/87     Pulse Rate 07/26/19 1926 80     Resp 07/26/19 1926 18     Temp 07/26/19 1926 98.5 F (36.9 C)     Temp Source 07/26/19 1926 Oral     SpO2 07/26/19 1926 100 %     Weight --      Height --      Head Circumference --      Peak Flow --      Pain Score 07/26/19 1925 2     Pain Loc --      Pain Edu? --      Excl. in [place]? --    No data found.  Updated Vital Signs BP (!) 143/87 (BP Location: Right Arm)   Pulse 80   Temp 98.5 F (36.9 C) (Oral)   Resp 18   SpO2 100%    Physical Exam Constitutional:      General: She is not in acute distress.    Appearance: She is well-developed.  HENT:     Mouth/Throat:       Comments: Ulcers to inside of right lower lip with swelling noted; ulceration noted to left lateral aspect of tongue; see photos  Cardiovascular:     Rate and Rhythm: Normal rate.  Pulmonary:     Effort: Pulmonary effort is normal.  Skin:    General: Skin is warm and dry.  Neurological:  Mental Status: She is alert and oriented to person, place, and time.            UC Treatments / Results  Labs (all labs ordered are listed, but only abnormal results are displayed) Labs Reviewed - No data to display  EKG   Radiology No results found.  Procedures Procedures (including critical care time)  Medications Ordered in UC Medications - No data to display  Initial Impression / Assessment and Plan / UC Course  I have reviewed the triage vital signs and the nursing notes.  Pertinent labs & imaging results that were available during my care of the patient were reviewed by me and considered in my medical decision making (see chart for details).     Ulcers appear source of swelling. Triamcinolone paste to affected area. Ice, orajel as needed, or other supportive measures until resolution. Patient verbalized understanding and agreeable to plan.   Final Clinical Impressions(s) / UC Diagnoses   Final diagnoses:  Oral ulcer     Discharge Instructions     May use ice application to help with your symptoms.  Steroid paste as prescribed or you may use over the counter OraJel if needed.  This should improve on its own in the next week, try to avoid biting of the area as this will further perpetuate the swelling.     ED Prescriptions    Medication Sig Dispense Auth. Provider   triamcinolone (KENALOG) 0.1 % paste Use as directed 1 application in the mouth or throat 2 (two) times daily. 5 g Zigmund Gottron, NP     Controlled Substance Prescriptions [place] Controlled Substance Registry consulted? Not Applicable   Zigmund Gottron, NP 07/26/19 [phone]

## 2019-07-26 NOTE — Discharge Instructions (Signed)
May use ice application to help with your symptoms.  Steroid paste as prescribed or you may use over the counter OraJel if needed.  This should improve on its own in the next week, try to avoid biting of the area as this will further perpetuate the swelling.

## 2019-07-27 ENCOUNTER — Other Ambulatory Visit: Payer: Self-pay | Admitting: *Deleted

## 2019-07-27 NOTE — Patient Outreach (Signed)
[place] [facility]) Care Management  07/27/2019  INZA MIKRUT 07/01/1971 [id number]   Follow up ED visit on 07/26/2019  Telephone Assessment-Unsuccessful  RN attempted outreach call to pt however unsuccessful. RN able to leave a HIPAA approved voice message requesting a call back.  Will follow up in 4 business days for ongoing [facility] services.  [name], RN Care Management Coordinator [place] Office [phone]

## 2019-07-30 ENCOUNTER — Other Ambulatory Visit: Payer: Self-pay | Admitting: *Deleted

## 2019-07-30 NOTE — Patient Outreach (Signed)
Brandi Gomez [facility]) Care Management  07/30/2019  ALAN SCHWIEGER 1971/03/30 [id number]    Telephone Assessment-Unsuccessful  RN attempted once again to reach this pt for pending [facility] services. RN called the home number and was informed to contact pt on her mobile device. RN attempted to call pt on her mobile however unsuccessful. RN able to leave a HIPAA approved voice message requesting a call back. Note letter [facility] letter sent with no response.   Will allow pt time to respond prior to case closure per policy.  [name], RN Care Management Coordinator [place] Office [phone]

## 2019-08-02 ENCOUNTER — Other Ambulatory Visit: Payer: Self-pay | Admitting: *Deleted

## 2019-08-02 NOTE — Patient Outreach (Signed)
[place] [facility]) Care Management  08/02/2019  Brandi Gomez 27-Oct-1971 [id number]   [place] made contact with pt today who reports she received the housing info mailed to her but is concerned they only take "older people". CSW encouraged pt to call and inquire and/or check out their websites as well as to consider seeking a more accommodating apartment/housing option within her budget. CSW will also ask Amber Chrismon, BSW, to reach out to pt for resource/info and support.   CSW will sign off as pt does not want to pursue placement in facility (ALF).   Eduard Clos, MSW, [place] Worker  [place] [phone]

## 2019-08-03 ENCOUNTER — Other Ambulatory Visit: Payer: Self-pay

## 2019-08-03 DIAGNOSIS — G4733 Obstructive sleep apnea (adult) (pediatric): Secondary | ICD-10-CM | POA: Diagnosis not present

## 2019-08-03 NOTE — Patient Outreach (Signed)
[place] [facility]) Care Management  08/03/2019  ISREAL RUNQUIST 12-14-1970 [id number]   Message received from [place], Eduard Clos, to outreach patient regarding housing resources.  Patient received housing resources that were previously mailed to her by Marcie Bal but stated that they are for seniors.  Suggested that patient utilize [email or web address] and [email or web address] as she is seeking low income/income based housing.  Patient stated that she has already searched options on [email or web address] but she will also search [email or web address].  She was informed that low income housing facilities typically have wait lists.  Closing case because all housing resources within patients budget have been provided.    Ronn Melena, BSW Social Worker [phone]

## 2019-08-09 ENCOUNTER — Ambulatory Visit (INDEPENDENT_AMBULATORY_CARE_PROVIDER_SITE_OTHER): Payer: Medicare HMO | Admitting: Emergency Medicine

## 2019-08-09 ENCOUNTER — Other Ambulatory Visit: Payer: Self-pay

## 2019-08-09 ENCOUNTER — Encounter: Payer: Self-pay | Admitting: Emergency Medicine

## 2019-08-09 ENCOUNTER — Other Ambulatory Visit: Payer: Self-pay | Admitting: *Deleted

## 2019-08-09 VITALS — BP 118/64 | HR 60 | Temp 99.0°F | Resp 16 | Ht 61.0 in | Wt 274.4 lb

## 2019-08-09 DIAGNOSIS — Z780 Asymptomatic menopausal state: Secondary | ICD-10-CM

## 2019-08-09 DIAGNOSIS — Z8742 Personal history of other diseases of the female genital tract: Secondary | ICD-10-CM | POA: Diagnosis not present

## 2019-08-09 DIAGNOSIS — R600 Localized edema: Secondary | ICD-10-CM

## 2019-08-09 DIAGNOSIS — R102 Pelvic and perineal pain: Secondary | ICD-10-CM | POA: Diagnosis not present

## 2019-08-09 DIAGNOSIS — Z23 Encounter for immunization: Secondary | ICD-10-CM

## 2019-08-09 DIAGNOSIS — G8929 Other chronic pain: Secondary | ICD-10-CM

## 2019-08-09 NOTE — Patient Outreach (Signed)
[facility]) Care Management  08/09/2019  Brandi Gomez October 28, 1971 [id number]

## 2019-08-09 NOTE — Progress Notes (Signed)
Brandi Gomez 48 y.o.   Chief Complaint  Patient presents with  . Abdominal Pain    per patient x 2 months ago and concerned if it is an ovarian cyst - referral to OB/GYN  . Referral    PER PATIENT TO FOOT DOCTOR    HISTORY OF PRESENT ILLNESS: This is a 48 y.o. female complaining of pelvic pain on and off for 2 months.  Has a history of ovarian cyst.  Needs gynecological referral. Eating and drinking well.  Denies nausea or vomiting.  Denies fever or chills.  Thinks she might be going into her menopause. Also has a history of chronic lower leg edema complaining of swelling to both feet. No other complaints or medical concerns today.  HPI   Prior to Admission medications   Medication Sig Start Date End Date Taking? Authorizing Provider  ARIPiprazole (ABILIFY) 5 MG tablet Take 5 mg by mouth daily.    [provider]  atorvastatin (LIPITOR) 10 MG tablet Take 1 tablet (10 mg total) by mouth daily at 6 PM. 05/12/19 08/10/19  Horald Pollen, MD  Blood Glucose Monitoring Suppl KIT Please supply kit covered by insurance and supplies for testing four times daily. E11.9 E10.9 05/12/19   Horald Pollen, MD  Blood Pressure Monitor KIT Take blood pressure reading twice a day for the next 2 weeks and then once a day. 05/12/19   Horald Pollen, MD  carvedilol (COREG) 3.125 MG tablet TAKE 1 TABLET (3.125 MG TOTAL) BY MOUTH DAILY. 05/18/19   Keshun Berrett, Ines Bloomer, MD  JANUVIA 50 MG tablet TAKE 1 TABLET (50 MG TOTAL) BY MOUTH DAILY. SCHEDULE APPOINTMENT FOR ADDITIONAL REFILLS. Patient taking differently: Take 50 mg by mouth daily.  05/17/19   Horald Pollen, MD  lamoTRIgine (LAMICTAL) 25 MG tablet 1 tablet twice daily for 2 weeks then take 2 tablets twice daily for 2 weeks, then take 3 tablets twice daily 06/30/19   [name], MD  torsemide (DEMADEX) 10 MG tablet Take 1 tablet (10 mg total) by mouth daily. 06/21/19 09/19/19  Horald Pollen, MD  triamcinolone  (KENALOG) 0.1 % paste Use as directed 1 application in the mouth or throat 2 (two) times daily. 07/26/19   Zigmund Gottron, NP  Vitamin D, Ergocalciferol, (DRISDOL) 1.25 MG (50000 UT) CAPS capsule Take 1 capsule (50,000 Units total) by mouth every 7 (seven) days. 06/23/19   Horald Pollen, MD    Allergies  Allergen Reactions  . Amoxicillin Itching    Has patient had a PCN reaction causing immediate rash, facial/tongue/throat swelling, SOB or lightheadedness with hypotension: yes Has patient had a PCN reaction causing severe rash involving mucus membranes or skin necrosis: no Has patient had a PCN reaction that required hospitalization: no Has patient had a PCN reaction occurring within the last 10 years: yes If all of the above answers are "NO", then may proceed with Cephalosporin use.  Marland Kitchen Lisinopril Swelling    Angioedema   . Hydrocodone Other (See Comments)    Depressed   . Tegretol [Carbamazepine] Swelling    Throat swells    Patient Active Problem List   Diagnosis Date Noted  . Depression 07/14/2019  . Bipolar disorder ([employer]) 05/13/2019  . Bipolar 1 disorder ([place]) 05/13/2019  . Adjustment disorder with anxiety   . History of recent stroke 08/06/2017  . OSA (obstructive sleep apnea)   . Cryptogenic stroke ([place]) 05/20/2017  . Controlled type 2 diabetes mellitus without complication, without  long-term current use of insulin ([place]) 05/08/2017  . Essential hypertension 07/27/2008  . Seizure disorder ([place]) 02/05/2007    Past Medical History:  Diagnosis Date  . Anemia   . Anxiety   . Bipolar 1 disorder ([place])   . Common migraine 05/19/2015  . Depression   . Diabetes mellitus, type II ([place])   . Hypertension   . Irritable bowel syndrome (IBS)   . Mild mental retardation   . Obesity   . Partial complex seizure disorder with intractable epilepsy ([place]) 05/12/2014  . Seizures ([place])    intractable, sz 08/23/17  . Sleep apnea   . Stroke ([place])   . Type II or unspecified type  diabetes mellitus without mention of complication, not stated as uncontrolled     Past Surgical History:  Procedure Laterality Date  . COLONOSCOPY     2012-normal , Dr Sharlett Iles  . ESOPHAGOGASTRODUODENOSCOPY     normal-Dr Patterson 2012  . LOOP RECORDER INSERTION N/A 05/30/2017   Procedure: Loop Recorder Insertion;  Surgeon: Constance Haw, MD;  Location: [facility];  Service: Cardiovascular;  Laterality: N/A;  . LOOP RECORDER REMOVAL N/A 03/04/2018   Procedure: LOOP RECORDER REMOVAL;  Surgeon: Constance Haw, MD;  Location: [facility];  Service: Cardiovascular;  Laterality: N/A;  . MYRINGOTOMY WITH TUBE PLACEMENT    . MYRINGOTOMY WITH TUBE PLACEMENT Right 11/05/2017   Procedure: MYRINGOTOMY WITH TUBE PLACEMENT;  Surgeon: Melissa Montane, MD;  Location: [facility];  Service: ENT;  Laterality: Right;  right T Tube placement  . NASAL SINUS SURGERY    . TEE WITHOUT CARDIOVERSION N/A 05/30/2017   Procedure: TRANSESOPHAGEAL ECHOCARDIOGRAM (TEE);  Surgeon: Acie Fredrickson Wonda Cheng, MD;  Location: [facility];  Service: Cardiovascular;  Laterality: N/A;    Social History   Socioeconomic History  . Marital status: Single    Spouse name: [name]  . Number of children: 3  . Years of education: 33  . Highest education level: Not on file  Occupational History  . Occupation: disabled    Employer: DISABLED  Social Needs  . Financial resource strain: Not on file  . Food insecurity    Worry: Not on file    Inability: Not on file  . Transportation needs    Medical: Not on file    Non-medical: Not on file  Tobacco Use  . Smoking status: Never Smoker  . Smokeless tobacco: Never Used  Substance and Sexual Activity  . Alcohol use: No  . Drug use: No  . Sexual activity: Never  Lifestyle  . Physical activity    Days per week: Not on file    Minutes per session: Not on file  . Stress: Not on file  Relationships  . Social Herbalist on phone: Not on file    Gets  together: Not on file    Attends religious service: Not on file    Active member of club or organization: Not on file    Attends meetings of clubs or organizations: Not on file    Relationship status: Not on file  . Intimate partner violence    Fear of current or ex partner: Not on file    Emotionally abused: Not on file    Physically abused: Not on file    Forced sexual activity: Not on file  Other Topics Concern  . Not on file  Social History Narrative   Patient lives at home with daughter.    Patient has 3  children.    Patient is right handed.    Patient has a high school education.    Patient is on disability   Patient drinks 2 cups of caffeine daily.    Family History  Problem Relation Age of Onset  . Diabetes Mother        passed away from accidental death  . Hypertension Mother   . Bipolar disorder Father   . Diabetes Daughter   . Leukemia Daughter   . Ovarian cancer Maternal Aunt      Review of Systems  Constitutional: Negative.  Negative for chills and fever.  HENT: Negative.  Negative for congestion and sore throat.   Eyes: Negative.  Negative for blurred vision and double vision.  Respiratory: Negative.  Negative for cough and shortness of breath.   Cardiovascular: Negative.  Negative for chest pain and palpitations.  Gastrointestinal: Positive for abdominal pain. Negative for blood in stool, diarrhea, melena and nausea.  Genitourinary: Negative.  Negative for dysuria and hematuria.  Musculoskeletal: Negative for myalgias.  Skin: Negative.  Negative for rash.  Neurological: Negative.  Negative for dizziness and headaches.  Endo/Heme/Allergies: Negative.   All other systems reviewed and are negative.    Vitals:   08/09/19 1132  BP: 118/64  Pulse: 60  Resp: 16  Temp: 99 F (37.2 C)  SpO2: 100%     Physical Exam Vitals signs reviewed.  Constitutional:      Appearance: She is obese.  HENT:     Head: Normocephalic.  Eyes:     Extraocular  Movements: Extraocular movements intact.     Pupils: Pupils are equal, round, and reactive to light.  Neck:     Musculoskeletal: Normal range of motion.  Cardiovascular:     Rate and Rhythm: Normal rate and regular rhythm.     Heart sounds: Normal heart sounds.  Pulmonary:     Effort: Pulmonary effort is normal.     Breath sounds: Normal breath sounds.  Abdominal:     Palpations: Abdomen is soft. There is no mass.     Tenderness: There is no abdominal tenderness.  Musculoskeletal:     Comments: Lower extremities: Mild distal edema mostly ankle and feet.  Warm to touch.  Good distal pulses with good capillary refill.  Good sensation.  Skin:    General: Skin is warm and dry.     Capillary Refill: Capillary refill takes less than 2 seconds.  Neurological:     General: No focal deficit present.     Mental Status: She is alert and oriented to person, place, and time.  Psychiatric:        Mood and Affect: Mood normal.        Behavior: Behavior normal.      ASSESSMENT & PLAN:  Rayhana was seen today for abdominal pain and referral.  Diagnoses and all orders for this visit:  Chronic pelvic pain in female -     Ambulatory referral to Obstetrics / Gynecology -     US Pelvis Complete; Future  Need for prophylactic vaccination and inoculation against influenza -     Flu Vaccine QUAD 36+ mos IM  History of ovarian cyst -     Ambulatory referral to Obstetrics / Gynecology -     US Pelvis Complete; Future  Menopause -     Ambulatory referral to Obstetrics / Gynecology  Edema of both feet    Patient Instructions       If you have lab work done  today you will be contacted with your lab results within the next 2 weeks.  If you have not heard from [place] then please contact us. The fastest way to get your results is to register for My Chart.   IF you received an x-ray today, you will receive an invoice from [facility]. Please contact [facility] at  [phone] with questions or concerns regarding your invoice.   IF you received labwork today, you will receive an invoice from [facility]. Please contact [facility] at [phone] with questions or concerns regarding your invoice.   Our billing staff will not be able to assist you with questions regarding bills from these companies.  You will be contacted with the lab results as soon as they are available. The fastest way to get your results is to activate your My Chart account. Instructions are located on the last page of this paperwork. If you have not heard from [place] regarding the results in 2 weeks, please contact this office.     Health Maintenance, Female Adopting a healthy lifestyle and getting preventive care are important in promoting health and wellness. Ask your health care provider about:  The right schedule for you to have regular tests and exams.  Things you can do on your own to prevent diseases and keep yourself healthy. What should I know about diet, weight, and exercise? Eat a healthy diet   Eat a diet that includes plenty of vegetables, fruits, low-fat dairy products, and lean protein.  Do not eat a lot of foods that are high in solid fats, added sugars, or sodium. Maintain a healthy weight Body mass index (BMI) is used to identify weight problems. It estimates body fat based on height and weight. Your health care provider can help determine your BMI and help you achieve or maintain a healthy weight. Get regular exercise Get regular exercise. This is one of the most important things you can do for your health. Most adults should:  Exercise for at least 150 minutes each week. The exercise should increase your heart rate and make you sweat (moderate-intensity exercise).  Do strengthening exercises at least twice a week. This is in addition to the moderate-intensity exercise.  Spend less time sitting. Even light physical activity can be beneficial. Watch cholesterol and  blood lipids Have your blood tested for lipids and cholesterol at 50 years of age, then have this test every 5 years. Have your cholesterol levels checked more often if:  Your lipid or cholesterol levels are high.  You are older than 50 years of age.  You are at high risk for heart disease. What should I know about cancer screening? Depending on your health history and family history, you may need to have cancer screening at various ages. This may include screening for:  Breast cancer.  Cervical cancer.  Colorectal cancer.  Skin cancer.  Lung cancer. What should I know about heart disease, diabetes, and high blood pressure? Blood pressure and heart disease  High blood pressure causes heart disease and increases the risk of stroke. This is more likely to develop in people who have high blood pressure readings, are of African descent, or are overweight.  Have your blood pressure checked: ? Every 3-5 years if you are 72-42 years of age. ? Every year if you are 13 years old or older. Diabetes Have regular diabetes screenings. This checks your fasting blood sugar level. Have the screening done:  Once every three years after age 62 if you are at  a normal weight and have a low risk for diabetes.  More often and at a younger age if you are overweight or have a high risk for diabetes. What should I know about preventing infection? Hepatitis B If you have a higher risk for hepatitis B, you should be screened for this virus. Talk with your health care provider to find out if you are at risk for hepatitis B infection. Hepatitis C Testing is recommended for:  Everyone born from [birth year] through 1967.  Anyone with known risk factors for hepatitis C. Sexually transmitted infections (STIs)  Get screened for STIs, including gonorrhea and chlamydia, if: ? You are sexually active and are younger than 50 years of age. ? You are older than 50 years of age and your health care provider tells  you that you are at risk for this type of infection. ? Your sexual activity has changed since you were last screened, and you are at increased risk for chlamydia or gonorrhea. Ask your health care provider if you are at risk.  Ask your health care provider about whether you are at high risk for HIV. Your health care provider may recommend a prescription medicine to help prevent HIV infection. If you choose to take medicine to prevent HIV, you should first get tested for HIV. You should then be tested every 3 months for as long as you are taking the medicine. Pregnancy  If you are about to stop having your period (premenopausal) and you may become pregnant, seek counseling before you get pregnant.  Take 400 to 800 micrograms (mcg) of folic acid every day if you become pregnant.  Ask for birth control (contraception) if you want to prevent pregnancy. Osteoporosis and menopause Osteoporosis is a disease in which the bones lose minerals and strength with aging. This can result in bone fractures. If you are 24 years old or older, or if you are at risk for osteoporosis and fractures, ask your health care provider if you should:  Be screened for bone loss.  Take a calcium or vitamin D supplement to lower your risk of fractures.  Be given hormone replacement therapy (HRT) to treat symptoms of menopause. Follow these instructions at home: Lifestyle  Do not use any products that contain nicotine or tobacco, such as cigarettes, e-cigarettes, and chewing tobacco. If you need help quitting, ask your health care provider.  Do not use street drugs.  Do not share needles.  Ask your health care provider for help if you need support or information about quitting drugs. Alcohol use  Do not drink alcohol if: ? Your health care provider tells you not to drink. ? You are pregnant, may be pregnant, or are planning to become pregnant.  If you drink alcohol: ? Limit how much you use to 0-1 drink a day. ?  Limit intake if you are breastfeeding.  Be aware of how much alcohol is in your drink. In the [place] one drink equals one 12 oz bottle of beer (355 mL), one 5 oz glass of wine (148 mL), or one 1 oz glass of hard liquor (44 mL). General instructions  Schedule regular health, dental, and eye exams.  Stay current with your vaccines.  Tell your health care provider if: ? You often feel depressed. ? You have ever been abused or do not feel safe at home. Summary  Adopting a healthy lifestyle and getting preventive care are important in promoting health and wellness.  Follow your health care provider's instructions about healthy  diet, exercising, and getting tested or screened for diseases.  Follow your health care provider's instructions on monitoring your cholesterol and blood pressure. This information is not intended to replace advice given to you by your health care provider. Make sure you discuss any questions you have with your health care provider. Document Released: 06/10/2011 Document Revised: 11/18/2018 Document Reviewed: 11/18/2018 Elsevier Patient Education  2020 Elsevier Inc.      Agustina Caroli, MD [facility]

## 2019-08-09 NOTE — Patient Instructions (Addendum)
   If you have lab work done today you will be contacted with your lab results within the next 2 weeks.  If you have not heard from us then please contact us. The fastest way to get your results is to register for My Chart.   IF you received an x-ray today, you will receive an invoice from [facility]. Please contact [facility] at [phone] with questions or concerns regarding your invoice.   IF you received labwork today, you will receive an invoice from [facility]. Please contact [facility] at [phone] with questions or concerns regarding your invoice.   Our billing staff will not be able to assist you with questions regarding bills from these companies.  You will be contacted with the lab results as soon as they are available. The fastest way to get your results is to activate your My Chart account. Instructions are located on the last page of this paperwork. If you have not heard from us regarding the results in 2 weeks, please contact this office.      Health Maintenance, Female Adopting a healthy lifestyle and getting preventive care are important in promoting health and wellness. Ask your health care provider about:  The right schedule for you to have regular tests and exams.  Things you can do on your own to prevent diseases and keep yourself healthy. What should I know about diet, weight, and exercise? Eat a healthy diet   Eat a diet that includes plenty of vegetables, fruits, low-fat dairy products, and lean protein.  Do not eat a lot of foods that are high in solid fats, added sugars, or sodium. Maintain a healthy weight Body mass index (BMI) is used to identify weight problems. It estimates body fat based on height and weight. Your health care provider can help determine your BMI and help you achieve or maintain a healthy weight. Get regular exercise Get regular exercise. This is one of the most important things you can do for your health. Most  adults should:  Exercise for at least 150 minutes each week. The exercise should increase your heart rate and make you sweat (moderate-intensity exercise).  Do strengthening exercises at least twice a week. This is in addition to the moderate-intensity exercise.  Spend less time sitting. Even light physical activity can be beneficial. Watch cholesterol and blood lipids Have your blood tested for lipids and cholesterol at 50 years of age, then have this test every 5 years. Have your cholesterol levels checked more often if:  Your lipid or cholesterol levels are high.  You are older than 50 years of age.  You are at high risk for heart disease. What should I know about cancer screening? Depending on your health history and family history, you may need to have cancer screening at various ages. This may include screening for:  Breast cancer.  Cervical cancer.  Colorectal cancer.  Skin cancer.  Lung cancer. What should I know about heart disease, diabetes, and high blood pressure? Blood pressure and heart disease  High blood pressure causes heart disease and increases the risk of stroke. This is more likely to develop in people who have high blood pressure readings, are of African descent, or are overweight.  Have your blood pressure checked: ? Every 3-5 years if you are 18-39 years of age. ? Every year if you are 40 years old or older. Diabetes Have regular diabetes screenings. This checks your fasting blood sugar level. Have the screening done:  Once every   three years after age 40 if you are at a normal weight and have a low risk for diabetes.  More often and at a younger age if you are overweight or have a high risk for diabetes. What should I know about preventing infection? Hepatitis B If you have a higher risk for hepatitis B, you should be screened for this virus. Talk with your health care provider to find out if you are at risk for hepatitis B infection. Hepatitis  C Testing is recommended for:  Everyone born from 1945 through 1965.  Anyone with known risk factors for hepatitis C. Sexually transmitted infections (STIs)  Get screened for STIs, including gonorrhea and chlamydia, if: ? You are sexually active and are younger than 50 years of age. ? You are older than 50 years of age and your health care provider tells you that you are at risk for this type of infection. ? Your sexual activity has changed since you were last screened, and you are at increased risk for chlamydia or gonorrhea. Ask your health care provider if you are at risk.  Ask your health care provider about whether you are at high risk for HIV. Your health care provider may recommend a prescription medicine to help prevent HIV infection. If you choose to take medicine to prevent HIV, you should first get tested for HIV. You should then be tested every 3 months for as long as you are taking the medicine. Pregnancy  If you are about to stop having your period (premenopausal) and you may become pregnant, seek counseling before you get pregnant.  Take 400 to 800 micrograms (mcg) of folic acid every day if you become pregnant.  Ask for birth control (contraception) if you want to prevent pregnancy. Osteoporosis and menopause Osteoporosis is a disease in which the bones lose minerals and strength with aging. This can result in bone fractures. If you are 65 years old or older, or if you are at risk for osteoporosis and fractures, ask your health care provider if you should:  Be screened for bone loss.  Take a calcium or vitamin D supplement to lower your risk of fractures.  Be given hormone replacement therapy (HRT) to treat symptoms of menopause. Follow these instructions at home: Lifestyle  Do not use any products that contain nicotine or tobacco, such as cigarettes, e-cigarettes, and chewing tobacco. If you need help quitting, ask your health care provider.  Do not use street  drugs.  Do not share needles.  Ask your health care provider for help if you need support or information about quitting drugs. Alcohol use  Do not drink alcohol if: ? Your health care provider tells you not to drink. ? You are pregnant, may be pregnant, or are planning to become pregnant.  If you drink alcohol: ? Limit how much you use to 0-1 drink a day. ? Limit intake if you are breastfeeding.  Be aware of how much alcohol is in your drink. In the [place] one drink equals one 12 oz bottle of beer (355 mL), one 5 oz glass of wine (148 mL), or one 1 oz glass of hard liquor (44 mL). General instructions  Schedule regular health, dental, and eye exams.  Stay current with your vaccines.  Tell your health care provider if: ? You often feel depressed. ? You have ever been abused or do not feel safe at home. Summary  Adopting a healthy lifestyle and getting preventive care are important in promoting health and wellness.    Follow your health care provider's instructions about healthy diet, exercising, and getting tested or screened for diseases.  Follow your health care provider's instructions on monitoring your cholesterol and blood pressure. This information is not intended to replace advice given to you by your health care provider. Make sure you discuss any questions you have with your health care provider. Document Released: 06/10/2011 Document Revised: 11/18/2018 Document Reviewed: 11/18/2018 Elsevier Patient Education  2020 [employer]vier Inc.  

## 2019-08-09 NOTE — Patient Outreach (Signed)
[place] [facility]) Care Management  08/09/2019  Brandi Gomez 1971/05/16 [id number]  Case Closure  RN has made several outreach attempts to reach this pt however unsuccessful. No response to the outreach letter or HIPAA message left to both pt's contact numbers. Will closed this case at this time with no other disciplines involved at this time. Primary provider will be notified accordingly and case will be noted as inactive.  [name], RN Care Management Coordinator [place] Office [phone]

## 2019-08-11 ENCOUNTER — Other Ambulatory Visit: Payer: Self-pay | Admitting: Emergency Medicine

## 2019-08-11 DIAGNOSIS — E119 Type 2 diabetes mellitus without complications: Secondary | ICD-10-CM

## 2019-08-11 NOTE — Telephone Encounter (Signed)
Requested Prescriptions  Pending Prescriptions Disp Refills  . sitaGLIPtin (JANUVIA) 50 MG tablet [Pharmacy Med Name: JANUVIA 50 MG TABLET] 90 tablet 1    Sig: Take 1 tablet by mouth daily.     Endocrinology:  Diabetes - DPP-4 Inhibitors Failed - 08/11/2019 11:32 AM      Failed - Cr in normal range and within 360 days    Creat  Date Value Ref Range Status  01/30/2015 0.72 0.50 - 1.10 mg/dL Final   Creatinine, Ser  Date Value Ref Range Status  07/14/2019 1.24 (H) 0.44 - 1.00 mg/dL Final         Passed - HBA1C is between 0 and 7.9 and within 180 days    Hgb A1c MFr Bld  Date Value Ref Range Status  06/21/2019 6.1 (H) 4.8 - 5.6 % Final    Comment:             Prediabetes: 5.7 - 6.4          Diabetes: >6.4          Glycemic control for adults with diabetes: <7.0          Passed - Valid encounter within last 6 months    Recent Outpatient Visits          2 days ago Chronic pelvic pain in female   Primary Care at [place], [place], MD   1 month ago Controlled type 2 diabetes mellitus without complication, without long-term current use of insulin [facility])   Primary Care at [facility], [place], MD   9 months ago Left hip pain   Primary Care at [place], [place], MD   1 year ago Controlled type 2 diabetes mellitus without complication, without long-term current use of insulin [facility])   Primary Care at [facility], Ines [place]er, MD   1 year ago Fungal skin infection   Primary Care at [facility], Ines Bloomer, MD      Future Appointments            In 5 months [place], Ines Bloomer, MD Primary Care at [place], [facility]           elevated Creatinine was reviewed by provider.

## 2019-08-16 DIAGNOSIS — R569 Unspecified convulsions: Secondary | ICD-10-CM | POA: Diagnosis not present

## 2019-08-16 DIAGNOSIS — G4733 Obstructive sleep apnea (adult) (pediatric): Secondary | ICD-10-CM | POA: Diagnosis not present

## 2019-08-16 DIAGNOSIS — M545 Low back pain: Secondary | ICD-10-CM | POA: Diagnosis not present

## 2019-08-17 ENCOUNTER — Other Ambulatory Visit: Payer: Self-pay | Admitting: Emergency Medicine

## 2019-08-17 NOTE — Telephone Encounter (Signed)
Requested medication (s) are due for refill today: yes  Requested medication (s) are on the active medication list yes   Last refill:  07/20/2019  Future visit scheduled:yes  Notes to clinic:  50,000 IU strengths are not delegated  Requested Prescriptions  Pending Prescriptions Disp Refills   Vitamin D, Ergocalciferol, (DRISDOL) 1.25 MG (50000 UT) CAPS capsule [Pharmacy Med Name: VITAMIN D2 1.25MG (50,000 UNIT)] 5 capsule 0    Sig: Take 1 capsule (50,000 Units total) by mouth every 7 (seven) days.     Endocrinology:  Vitamins - Vitamin D Supplementation Failed - 08/17/2019  1:35 AM      Failed - 50,000 IU strengths are not delegated      Failed - Ca in normal range and within 360 days    Calcium  Date Value Ref Range Status  07/14/2019 8.5 (L) 8.9 - 10.3 mg/dL Final         Failed - Phosphate in normal range and within 360 days    Phosphorus  Date Value Ref Range Status  06/04/2017 2.6 2.5 - 4.6 mg/dL Final         Failed - Vitamin D in normal range and within 360 days    Vit D, 25-Hydroxy  Date Value Ref Range Status  06/21/2019 17.7 (L) 30.0 - 100.0 ng/mL Final    Comment:    Vitamin D deficiency has been defined by the Institute of Medicine and an Endocrine Society practice guideline as a level of serum 25-OH vitamin D less than 20 ng/mL (1,2). The Endocrine Society went on to further define vitamin D insufficiency as a level between 21 and 29 ng/mL (2). 1. IOM (Institute of Medicine). 2010. Dietary reference    intakes for calcium and D. [place]: The    [employer]. 2. Holick MF, Binkley Pitcairn, Bischoff-Ferrari HA, et al.    Evaluation, treatment, and prevention of vitamin D    deficiency: an Endocrine Society clinical practice    guideline. JCEM. 2011 Jul; 96(7):1911-30.          Passed - Valid encounter within last 12 months    Recent Outpatient Visits          1 week ago Chronic pelvic pain in female   Primary Care at [place], [place], MD   1 month ago Controlled type 2 diabetes mellitus without complication, without long-term current use of insulin [facility])   Primary Care at [facility], [place], MD   3 months ago Controlled type 2 diabetes mellitus without complication, without long-term current use of insulin [facility])   Primary Care at [facility], [place], MD   3 months ago Controlled type 2 diabetes mellitus without complication, without long-term current use of insulin [facility])   Primary Care at [facility], [place], MD   9 months ago Left hip pain   Primary Care at [facility], Ines Bloomer, MD      Future Appointments            In 5 months [place], Ines Bloomer, MD Primary Care at [place], [facility]

## 2019-08-19 ENCOUNTER — Other Ambulatory Visit: Payer: Self-pay | Admitting: *Deleted

## 2019-08-23 ENCOUNTER — Other Ambulatory Visit: Payer: Self-pay | Admitting: *Deleted

## 2019-08-23 ENCOUNTER — Encounter: Payer: Self-pay | Admitting: *Deleted

## 2019-08-23 NOTE — Patient Outreach (Signed)
[place] [facility]) Care Management  08/23/2019  ANJELINA CUPPY [birth year] [id number]   Referral Received 08/23/2019 Initial Outreach 9//14/2020  Note pt was started in a program in the pass 30 days for diabetes however unable to maintain ongoing communication and case was closured. Pt has been re-referred for interventions today.   Telephone Assessment-Successful and enrolled for HTN  RN spoke with pt and reintroduced to the [facility] program and services. Pt receptive to a conversation today. RN discussed previous encounters related pt's diabetes however controlled with morning readings around the 100's. Pt states she needs to eat healthier and received the printer material RN sent to her last month. RN strongly encouraged pt to review the information and briefly discussed what was sent. Other discussed related to pt's BP. Pt requested a focus on her HTN as she will try to reduce the carbs in her diet to improve her ongoing diabetes.   HTN-Pt reports her readings at the time of this call were 203/113 and 205/125 with no symptoms. RN inquired further on the positioning, type of cuff and technique when taking her BP. RN instructed pt step-by-step on how to apply the cuff to the non dominate arm, positioning of arm at heart level resting on table and encouraged pt to be in a relaxed atmosphere prior to taking her BP. Once this was completed pt's BP readings were left arm 170/77 and right 190/83. Pt has never reports any of her BP or received instructions on how to obtain her BP other then the instructions on the device itself. Pt relieved to know her BP is not as elevated as she thought and very appreciative for the information and instructions provider today. RN further discussed her main issues and inquired on a focus point however addressing any other issues presented. Verified no related symptoms at this time with no headaches or GI issues related to elevated BP. Will offer EMMI to be sent  for future discussion on HTN and stroke prevention measures part of the goals of care concerning this program (pt receptive).   DIABETES-Discussed how importance that morning fasting reading is related to her Diabetes and consuming a more healthier diet eliminating high carb items and consuming more proteins. Strongly encouraged pt to review the printed material send to pt last month on healthy eating habits related to diabetes. Pt feels she is able to make a change in her diet to improve her diabetes.  MEDICATION-Pt states she is not aware of what her medications are for or why she is taking them. RN review all her medications and the purpose. Verified pt ha a clearer understanding and enough supplies. Encouraged pt to refill all medications several weeks before to ensure she has enough medications not to miss any dosages.  States her supportive daughter Arrie Senate) prepares her medications for administration with no problems. Offered to discuss her medication regimen with her daughter however daughter was sleeping at this time. Pt was able to follow through with all medications and indicated she understood the purpose at this time but also states she has a bad memory and will forget this discuss on her medications. Based upon today's discussion pt labeled all her pill bottles as discussed today for future reminders on the purpose of each medications.   NUTRITION-Will inquire on dietary habits and strongly encouraged on healthier eating habits. Pt inquired on meal delivery with [employer] as she has recieved this in the past. RN offered the contact number for [employer] Meals with [facility] and  provider the contact number. If any problems pt aware to contact this RN case manager and provider the direct contact number if needed.   Based upon the above information and pt's agreement to participate once again. Will focus on pt's elevated BP and education will be provider over the course of several months as pt attempts  to improve her HTN and consuming a healthier lifestyle of food items. Plan of care generated and pt's provider will be updated on pt's disposition with [facility] services.  THN CM Care Plan Problem One     Most Recent Value  Care Plan Problem One  Deficient knowledge related to Hypertension  Role Documenting the Problem One  Care Management Coordinator  Care Plan for Problem One  Active  THN Long Term Goal   Pt will verbalized two symptoms of elevated BP within 90 days  THN Long Term Goal Start Date  08/23/19  Interventions for Problem One Long Term Goal  Will send printed material on HTN and stroke prevention. Will discuss the risk involved if this condition is not monitored. Will educate on symptoms and what to do if acute.    THN CM Short Term Goal #1   Pt will monitor her BP twice weekly and document readings in calendar for providers to view within the next 30 days.  THN CM Short Term Goal #1 Start Date  08/23/19  Interventions for Short Term Goal #1  Will again stress the importance of HTN related to strokes and her hx of TIA. Will discuss how often and when to take her BP to avoid acute events and iearly intervention. Pt will take twice weeky and aware when to contact her provider with acute readings.   THN CM Short Term Goal #2   Pt will improve her dietary intake with decrease sodium and consume a more healthier diet within the next 30 days  THN CM Short Term Goal #2 Start Date  08/23/19  Interventions for Short Term Goal #2  Will discuss food items to avoid if possible that are high in sodium and other food items to avoid that can result in high blood presssure. Nettie Elm also send EMMI Administrator with nutritional information .      [name], RN Care Management Coordinator [place] Office [phone]

## 2019-08-28 ENCOUNTER — Emergency Department (HOSPITAL_COMMUNITY)
Admission: EM | Admit: 2019-08-28 | Discharge: 2019-08-28 | Disposition: A | Discharge status: Home / Self Care | Payer: Medicare HMO | Attending: Emergency Medicine | Admitting: Emergency Medicine

## 2019-08-28 ENCOUNTER — Encounter (HOSPITAL_COMMUNITY): Payer: Self-pay

## 2019-08-28 ENCOUNTER — Other Ambulatory Visit: Payer: Self-pay

## 2019-08-28 DIAGNOSIS — Z8673 Personal history of transient ischemic attack (TIA), and cerebral infarction without residual deficits: Secondary | ICD-10-CM | POA: Insufficient documentation

## 2019-08-28 DIAGNOSIS — R52 Pain, unspecified: Secondary | ICD-10-CM | POA: Diagnosis not present

## 2019-08-28 DIAGNOSIS — R5383 Other fatigue: Secondary | ICD-10-CM | POA: Diagnosis present

## 2019-08-28 DIAGNOSIS — F99 Mental disorder, not otherwise specified: Secondary | ICD-10-CM | POA: Diagnosis not present

## 2019-08-28 DIAGNOSIS — Z79899 Other long term (current) drug therapy: Secondary | ICD-10-CM | POA: Insufficient documentation

## 2019-08-28 DIAGNOSIS — R531 Weakness: Secondary | ICD-10-CM | POA: Diagnosis not present

## 2019-08-28 DIAGNOSIS — I10 Essential (primary) hypertension: Secondary | ICD-10-CM | POA: Insufficient documentation

## 2019-08-28 DIAGNOSIS — E119 Type 2 diabetes mellitus without complications: Secondary | ICD-10-CM | POA: Insufficient documentation

## 2019-08-28 DIAGNOSIS — Z7984 Long term (current) use of oral hypoglycemic drugs: Secondary | ICD-10-CM | POA: Insufficient documentation

## 2019-08-28 LAB — CBC WITH DIFFERENTIAL/PLATELET
Abs Immature Granulocytes: 0.04 10*3/uL (ref 0.00–0.07)
Basophils Absolute: 0 10*3/uL (ref 0.0–0.1)
Basophils Relative: 0 %
Eosinophils Absolute: 0.2 10*3/uL (ref 0.0–0.5)
Eosinophils Relative: 2 %
HCT: 35.3 % — ABNORMAL LOW (ref 36.0–46.0)
Hemoglobin: 10.9 g/dL — ABNORMAL LOW (ref 12.0–15.0)
Immature Granulocytes: 1 %
Lymphocytes Relative: 33 %
Lymphs Abs: 2.9 10*3/uL (ref 0.7–4.0)
MCH: 28.8 pg (ref 26.0–34.0)
MCHC: 30.9 g/dL (ref 30.0–36.0)
MCV: 93.4 fL (ref 80.0–100.0)
Monocytes Absolute: 0.5 10*3/uL (ref 0.1–1.0)
Monocytes Relative: 6 %
Neutro Abs: 5.1 10*3/uL (ref 1.7–7.7)
Neutrophils Relative %: 58 %
Platelets: 312 10*3/uL (ref 150–400)
RBC: 3.78 MIL/uL — ABNORMAL LOW (ref 3.87–5.11)
RDW: 14.3 % (ref 11.5–15.5)
WBC: 8.7 10*3/uL (ref 4.0–10.5)
nRBC: 0 % (ref 0.0–0.2)

## 2019-08-28 LAB — URINALYSIS, ROUTINE W REFLEX MICROSCOPIC
Bilirubin Urine: NEGATIVE
Glucose, UA: NEGATIVE mg/dL
Hgb urine dipstick: NEGATIVE
Ketones, ur: NEGATIVE mg/dL
Leukocytes,Ua: NEGATIVE
Nitrite: NEGATIVE
Protein, ur: NEGATIVE mg/dL
Specific Gravity, Urine: 1.014 (ref 1.005–1.030)
pH: 5 (ref 5.0–8.0)

## 2019-08-28 LAB — COMPREHENSIVE METABOLIC PANEL
ALT: 16 U/L (ref 0–44)
AST: 36 U/L (ref 15–41)
Albumin: 3.9 g/dL (ref 3.5–5.0)
Alkaline Phosphatase: 85 U/L (ref 38–126)
Anion gap: 13 (ref 5–15)
BUN: 25 mg/dL — ABNORMAL HIGH (ref 6–20)
CO2: 26 mmol/L (ref 22–32)
Calcium: 8.9 mg/dL (ref 8.9–10.3)
Chloride: 101 mmol/L (ref 98–111)
Creatinine, Ser: 1.51 mg/dL — ABNORMAL HIGH (ref 0.44–1.00)
GFR calc Af Amer: 46 mL/min — ABNORMAL LOW (ref >60)
GFR calc non Af Amer: 40 mL/min — ABNORMAL LOW (ref >60)
Glucose, Bld: 111 mg/dL — ABNORMAL HIGH (ref 70–99)
Potassium: 3.5 mmol/L (ref 3.5–5.1)
Sodium: 140 mmol/L (ref 135–145)
Total Bilirubin: 0.2 mg/dL — ABNORMAL LOW (ref 0.3–1.2)
Total Protein: 8.1 g/dL (ref 6.5–8.1)

## 2019-08-28 NOTE — ED Notes (Signed)
Pt is requesting to know about her labs results

## 2019-08-28 NOTE — ED Notes (Addendum)
NT attempted blood draw x 2 and was unsuccessful. RN made aware. Phlebotomy contacted.

## 2019-08-28 NOTE — ED Notes (Signed)
Pt ambulated to/from the restroom with standby assist. No issues observed during ambulation.

## 2019-08-28 NOTE — ED Provider Notes (Signed)
[place] DEPT Provider Note   CSN: [id number] Arrival date & time: 08/28/19  1601     History   Chief Complaint Chief Complaint  Patient presents with  . Fatigue    HPI Brandi Gomez is a 48 y.o. female.     HPI Patient presents to the emergency department with a seizure that occurred earlier this morning.  The patient states that she may have had a seizure while sleeping.  The patient states she obviously bit her tongue but she is having pain and there is bruise to the tongue.  The patient states that she is unsure what time this occurred.  She estimates maybe about 6:30 in the morning.  Patient states her daughter checked on her but was unable to verify if she had had a seizure.  Patient states that she has not seen her neurologist recently for any issues.  Patient states that she has been taking her medications as prescribed.  The patient denies chest pain, shortness of breath, headache,blurred vision, neck pain, fever, cough, weakness, numbness, dizziness, anorexia, edema, abdominal pain, nausea, vomiting, diarrhea, rash, back pain, dysuria, hematemesis, bloody stool, near syncope, or syncope. Past Medical History:  Diagnosis Date  . Anemia   . Anxiety   . Bipolar 1 disorder ([place])   . Common migraine 05/19/2015  . Depression   . Diabetes mellitus, type II ([place])   . Hypertension   . Irritable bowel syndrome (IBS)   . Mild mental retardation   . Obesity   . Partial complex seizure disorder with intractable epilepsy ([place]) 05/12/2014  . Seizures ([place])    intractable, sz 08/23/17  . Sleep apnea   . Stroke ([place])   . Type II or unspecified type diabetes mellitus without mention of complication, not stated as uncontrolled     Patient Active Problem List   Diagnosis Date Noted  . Depression 07/14/2019  . Bipolar disorder ([place]) 05/13/2019  . Bipolar 1 disorder ([place]) 05/13/2019  . Adjustment disorder with anxiety   . History of recent stroke  08/06/2017  . OSA (obstructive sleep apnea)   . Cryptogenic stroke ([place]) 05/20/2017  . Controlled type 2 diabetes mellitus without complication, without long-term current use of insulin ([place]) 05/08/2017  . Essential hypertension 07/27/2008  . Seizure disorder ([place]) 02/05/2007    Past Surgical History:  Procedure Laterality Date  . COLONOSCOPY     2012-normal , Dr Sharlett Iles  . ESOPHAGOGASTRODUODENOSCOPY     normal-Dr Patterson 2012  . LOOP RECORDER INSERTION N/A 05/30/2017   Procedure: Loop Recorder Insertion;  Surgeon: Constance Haw, MD;  Location: [place] [facility];  Service: Cardiovascular;  Laterality: N/A;  . LOOP RECORDER REMOVAL N/A 03/04/2018   Procedure: LOOP RECORDER REMOVAL;  Surgeon: Constance Haw, MD;  Location: [place] [facility];  Service: Cardiovascular;  Laterality: N/A;  . MYRINGOTOMY WITH TUBE PLACEMENT    . MYRINGOTOMY WITH TUBE PLACEMENT Right 11/05/2017   Procedure: MYRINGOTOMY WITH TUBE PLACEMENT;  Surgeon: Melissa Montane, MD;  Location: [place];  Service: ENT;  Laterality: Right;  right T Tube placement  . NASAL SINUS SURGERY    . TEE WITHOUT CARDIOVERSION N/A 05/30/2017   Procedure: TRANSESOPHAGEAL ECHOCARDIOGRAM (TEE);  Surgeon: Acie Fredrickson Wonda Cheng, MD;  Location: [facility] ENDOSCOPY;  Service: Cardiovascular;  Laterality: N/A;     OB History   No obstetric history on file.      Home Medications    Prior to Admission medications   Medication Sig Start  Date End Date Taking? Authorizing Provider  ARIPiprazole (ABILIFY) 5 MG tablet Take 5 mg by mouth daily.    [provider]  atorvastatin (LIPITOR) 10 MG tablet Take 1 tablet (10 mg total) by mouth daily at 6 PM. 05/12/19 08/23/19  Horald Pollen, MD  Blood Glucose Monitoring Suppl KIT Please supply kit covered by insurance and supplies for testing four times daily. E11.9 E10.9 05/12/19   Horald Pollen, MD  Blood Pressure Monitor KIT Take blood pressure reading twice a day for the next 2  weeks and then once a day. 05/12/19   Horald Pollen, MD  carvedilol (COREG) 3.125 MG tablet TAKE 1 TABLET (3.125 MG TOTAL) BY MOUTH DAILY. 05/18/19   Horald Pollen, MD  lamoTRIgine (LAMICTAL) 25 MG tablet 1 tablet twice daily for 2 weeks then take 2 tablets twice daily for 2 weeks, then take 3 tablets twice daily 06/30/19   [name], MD  sitaGLIPtin (JANUVIA) 50 MG tablet Take 1 tablet by mouth daily. 08/11/19   Horald Pollen, MD  torsemide (DEMADEX) 10 MG tablet Take 1 tablet (10 mg total) by mouth daily. 06/21/19 09/19/19  Horald Pollen, MD  triamcinolone (KENALOG) 0.1 % paste Use as directed 1 application in the mouth or throat 2 (two) times daily. Patient not taking: Reported on 08/23/2019 07/26/19   Augusto Gamble B, NP  Vitamin D, Ergocalciferol, (DRISDOL) 1.25 MG (50000 UT) CAPS capsule TAKE 1 CAPSULE (50,000 UNITS TOTAL) BY MOUTH EVERY 7 (SEVEN) DAYS. Patient taking differently: Take 50,000 Units by mouth every 7 (seven) days. Restart today will pick up at local drug store 08/22/19   Horald Pollen, MD    Family History Family History  Problem Relation Age of Onset  . Diabetes Mother        passed away from accidental death  . Hypertension Mother   . Bipolar disorder Father   . Diabetes Daughter   . Leukemia Daughter   . Ovarian cancer Maternal Aunt     Social History Social History   Tobacco Use  . Smoking status: Never Smoker  . Smokeless tobacco: Never Used  Substance Use Topics  . Alcohol use: No  . Drug use: No     Allergies   Amoxicillin, Lisinopril, Hydrocodone, and Tegretol [carbamazepine]   Review of Systems Review of Systems All other systems negative except as documented in the HPI. All pertinent positives and negatives as reviewed in the HPI.  Physical Exam Updated Vital Signs BP 127/86 (BP Location: Left Arm)   Pulse 62   Temp 99.1 F (37.3 C) (Oral)   Resp 18   Wt 125 kg   SpO2 100%   BMI 52.07 kg/m    Physical Exam Vitals signs and nursing note reviewed.  Constitutional:      General: She is not in acute distress.    Appearance: She is well-developed.  HENT:     Head: Normocephalic and atraumatic.  Eyes:     Pupils: Pupils are equal, round, and reactive to light.  Neck:     Musculoskeletal: Normal range of motion and neck supple.  Cardiovascular:     Rate and Rhythm: Normal rate and regular rhythm.     Heart sounds: Normal heart sounds. No murmur. No friction rub. No gallop.   Pulmonary:     Effort: Pulmonary effort is normal. No respiratory distress.     Breath sounds: Normal breath sounds. No wheezing.  Abdominal:     General:  Bowel sounds are normal. There is no distension.     Palpations: Abdomen is soft.     Tenderness: There is no abdominal tenderness.  Skin:    General: Skin is warm and dry.     Capillary Refill: Capillary refill takes less than 2 seconds.     Findings: No erythema or rash.  Neurological:     Mental Status: She is alert and oriented to person, place, and time.     Sensory: No sensory deficit.     Motor: No weakness or abnormal muscle tone.     Coordination: Coordination normal.     Gait: Gait normal.     Deep Tendon Reflexes: Reflexes normal.  Psychiatric:        Behavior: Behavior normal.      ED Treatments / Results  Labs (all labs ordered are listed, but only abnormal results are displayed) Labs Reviewed  COMPREHENSIVE METABOLIC PANEL - Abnormal; Notable for the following components:      Result Value   Glucose, Bld 111 (*)    BUN 25 (*)    Creatinine, Ser 1.51 (*)    Total Bilirubin 0.2 (*)    GFR calc non Af Amer 40 (*)    GFR calc Af Amer 46 (*)    All other components within normal limits  CBC WITH DIFFERENTIAL/PLATELET - Abnormal; Notable for the following components:   RBC 3.78 (*)    Hemoglobin 10.9 (*)    HCT 35.3 (*)    All other components within normal limits  URINALYSIS, ROUTINE W REFLEX MICROSCOPIC    EKG None   Radiology No results found.  Procedures Procedures (including critical care time)  Medications Ordered in ED Medications - No data to display   Initial Impression / Assessment and Plan / ED Course  I have reviewed the triage vital signs and the nursing notes.  Pertinent labs & imaging results that were available during my care of the patient were reviewed by me and considered in my medical decision making (see chart for details).      Patient is eventually to follow-up with her neurologist for further evaluation and care.  The patient is advised of her results and all questions were answered.  Advised the patient that she needs to ensure that she is taking her appropriate dose of Tegretol.  She is on no other medications for her seizures.  Patient has been observed here in the emergency department for 4 hours.  She has been stable and ambulated multiple times.  Final Clinical Impressions(s) / ED Diagnoses   Final diagnoses:  None    ED Discharge Orders    None       Dalia Heading, PA-C 08/28/19 2004    Francine Graven, DO 08/29/19 1629

## 2019-08-28 NOTE — Discharge Instructions (Signed)
You will need to see your neurologist for a recheck.  Make sure that you are taking your medications as prescribed.  Return here for any worsening in your condition.

## 2019-08-28 NOTE — ED Triage Notes (Addendum)
Pt BIBA from home. Pt states that she had a seizure at [place]. Pt assumes this because she bit her tongue, hx of epilepsy. Pt states that since then, she has just been feeling "off"

## 2019-08-30 ENCOUNTER — Other Ambulatory Visit: Payer: Self-pay | Admitting: *Deleted

## 2019-08-30 NOTE — Patient Outreach (Addendum)
[place] [facility]) Care Management  08/30/2019  Brandi Gomez July 10, 1971 [id number]    Telephone Assessment-Unsuccessful  RN attempted outreach calls to both the home and mobile numbers only abe to leave a message requesting a call back. Spoke with individual in the home on the home contact # who indicates pt was sleeping.   Will rescheduled another outreach call within the next 4 business days for ongoing services.  [name], RN Care Management Coordinator [place] Office [phone]

## 2019-09-01 ENCOUNTER — Encounter: Payer: Self-pay | Admitting: *Deleted

## 2019-09-01 ENCOUNTER — Other Ambulatory Visit: Payer: Self-pay | Admitting: *Deleted

## 2019-09-01 NOTE — Patient Outreach (Signed)
[place] [facility]) Care Management  09/01/2019  Brandi Gomez 12-31-1970 [id number]    Telephone Assessment-HTN  RN received a call from pt today with an update on her ongoing management of care. RN able to completed the initial assessment. Note RN attempted to reach pt earlier this week as pt returned the call today for this reason. Plan of care was also addressed, reviewed with updated interventions. Strongly encouraged pt to continue her management of care and offered any needed resources at this time (none needed).   RN also followed up wit a recent ED visit as pt reports she thought she was having a seizure bu this was negative. Pt verifies she is taking all her medications and has sufficient transportation to her medical appointments. Based upon today's assessment pt informed her provider will be updated with this plan of care and goals discussed for ongoing management of care.   Will follow up next month with a virtual call concerning the following plan of care.  THN CM Care Plan Problem One     Most Recent Value  Care Plan Problem One  Deficient knowledge related to Hypertension  Role Documenting the Problem One  Care Management Coordinator  Care Plan for Problem One  Active  THN Long Term Goal   Pt will verbalized two symptoms of elevated BP within 90 days  THN Long Term Goal Start Date  08/23/19  Interventions for Problem One Long Term Goal  Will reiterate on the symptoms that pt may encounter and what to do if acute. Will strongly encouraged pt ot review the printed material provider for ongoing self management of care related her HTN.  THN CM Short Term Goal #1   Pt will monitor her BP twice weekly and document readings in calendar for providers to view within the next 30 days.  THN CM Short Term Goal #1 Start Date  08/23/19  Interventions for Short Term Goal #1  WIll continue to encouraged document all readings and report any abnormal  symptoms reltaed elevated  BP. Will re-evaluate this goal next month for pt's adherence,.  THN CM Short Term Goal #2   Pt will improve her dietary intake with decrease sodium and consume a more healthier diet within the next 30 days  THN CM Short Term Goal #2 Start Date  08/23/19  Interventions for Short Term Goal #2  WIll continue to encouraged a low sodium diet with bake foods and lots of veggies. Will continue ot encouraged adherence with this diet and re-evaluate over the next month.      [name], RN Care Management Coordinator [place] Office [phone]

## 2019-09-03 ENCOUNTER — Ambulatory Visit: Payer: Self-pay | Admitting: *Deleted

## 2019-09-06 ENCOUNTER — Encounter: Payer: Medicare HMO | Admitting: Obstetrics and Gynecology

## 2019-09-06 ENCOUNTER — Encounter: Payer: Self-pay | Admitting: Family Medicine

## 2019-09-08 ENCOUNTER — Ambulatory Visit (INDEPENDENT_AMBULATORY_CARE_PROVIDER_SITE_OTHER): Payer: Medicare HMO | Admitting: Psychiatry

## 2019-09-08 ENCOUNTER — Other Ambulatory Visit: Payer: Self-pay

## 2019-09-08 DIAGNOSIS — I639 Cerebral infarction, unspecified: Secondary | ICD-10-CM | POA: Diagnosis not present

## 2019-09-08 DIAGNOSIS — F0630 Mood disorder due to known physiological condition, unspecified: Secondary | ICD-10-CM | POA: Diagnosis not present

## 2019-09-08 MED ORDER — ARIPIPRAZOLE 5 MG PO TABS: 5.0000 mg | ORAL_TABLET | Freq: Every day | ORAL | 3 refills | Status: DC

## 2019-09-08 NOTE — Progress Notes (Signed)
Psychiatric Initial Adult Assessment   Patient Identification: Brandi Gomez MRN:  [id number] Date of Evaluation:  09/08/2019 Referral Source: Dr. Tory Emerald Chief Complaint:  Suspiciousness/paranoia Visit Diagnosis: Mood disorder secondary to CVA  Today the patient was interviewed with her boyfriend Brandi Gomez who lives with her.  Overall the patient is actually doing a little bit better.  She no longer is making efforts to elope.  The patient also was open to share with me that she was lonely wanted a therapist to talk to him.  She says she has a lot of issues she want to get into.  She seems to have a lot more going on inside than is apparent.  The patient does not appear anxious or depressed according to [facility].  She actually is sleeping and eating fairly well.  She shows no evidence of psychosis.  There is some confusion about her medications.  According to the chart she is not taking Tegretol just Lamictal and she is not taking 10 mg of Abilify she is actually taking 5 mg.  The patient shows little evidence of being irritable or impulsive.  I think the patient is significantly cognitively limited and needs to have some structured stimulation.  The ideal person for her would be a counselor would help her organize her day in her life.  Patient is not aggressive.  She is not suicidal.  She denies being short of breath or coughing or fever.  Neurologically she is stable. Depression Symptoms:  fatigue, (Hypo) Manic Symptoms:   Anxiety Symptoms:   Psychotic Symptoms:   PTSD Symptoms:   Past Psychiatric History: Cymbalta 30 mg Previous Psychotropic Medications: Cymbalta 30 mg  Substance Abuse History in the last 12 months:  No.  Consequences of Substance Abuse:   Past Medical History:  Past Medical History:  Diagnosis Date  . Anemia   . Anxiety   . Bipolar 1 disorder ([place])   . Common migraine 05/19/2015  . Depression   . Diabetes mellitus, type II ([place])   . Hypertension   . Irritable  bowel syndrome (IBS)   . Mild mental retardation   . Obesity   . Partial complex seizure disorder with intractable epilepsy ([place]) 05/12/2014  . Seizures ([place])    intractable, sz 08/23/17  . Sleep apnea   . Stroke ([place])   . Type II or unspecified type diabetes mellitus without mention of complication, not stated as uncontrolled     Past Surgical History:  Procedure Laterality Date  . COLONOSCOPY     2012-normal , Dr Sharlett Iles  . ESOPHAGOGASTRODUODENOSCOPY     normal-Dr Patterson 2012  . LOOP RECORDER INSERTION N/A 05/30/2017   Procedure: Loop Recorder Insertion;  Surgeon: Constance Haw, MD;  Location: [place] [facility];  Service: Cardiovascular;  Laterality: N/A;  . LOOP RECORDER REMOVAL N/A 03/04/2018   Procedure: LOOP RECORDER REMOVAL;  Surgeon: Constance Haw, MD;  Location: [place] [facility];  Service: Cardiovascular;  Laterality: N/A;  . MYRINGOTOMY WITH TUBE PLACEMENT    . MYRINGOTOMY WITH TUBE PLACEMENT Right 11/05/2017   Procedure: MYRINGOTOMY WITH TUBE PLACEMENT;  Surgeon: Melissa Montane, MD;  Location: [place];  Service: ENT;  Laterality: Right;  right T Tube placement  . NASAL SINUS SURGERY    . TEE WITHOUT CARDIOVERSION N/A 05/30/2017   Procedure: TRANSESOPHAGEAL ECHOCARDIOGRAM (TEE);  Surgeon: Acie Fredrickson Wonda Cheng, MD;  Location: [facility] ENDOSCOPY;  Service: Cardiovascular;  Laterality: N/A;    Family Psychiatric History:   Family History:  Family History  Problem Relation Age of Onset  . Diabetes Mother        passed away from accidental death  . Hypertension Mother   . Bipolar disorder Father   . Diabetes Daughter   . Leukemia Daughter   . Ovarian cancer Maternal Aunt     Social History:   Social History   Socioeconomic History  . Marital status: Single    Spouse name: Brandi Gomez  . Number of children: 3  . Years of education: 11  . Highest education level: Not on file  Occupational History  . Occupation: disabled    Employer: DISABLED  Social Needs  .  Financial resource strain: Not on file  . Food insecurity    Worry: Not on file    Inability: Not on file  . Transportation needs    Medical: Not on file    Non-medical: Not on file  Tobacco Use  . Smoking status: Never Smoker  . Smokeless tobacco: Never Used  Substance and Sexual Activity  . Alcohol use: No  . Drug use: No  . Sexual activity: Never  Lifestyle  . Physical activity    Days per week: Not on file    Minutes per session: Not on file  . Stress: Not on file  Relationships  . Social Herbalist on phone: Not on file    Gets together: Not on file    Attends religious service: Not on file    Active member of club or organization: Not on file    Attends meetings of clubs or organizations: Not on file    Relationship status: Not on file  Other Topics Concern  . Not on file  Social History Narrative   Patient lives at home with daughter.    Patient has 3 children.    Patient is right handed.    Patient has a high school education.    Patient is on disability   Patient drinks 2 cups of caffeine daily.    Additional Social History:   Allergies:   Allergies  Allergen Reactions  . Amoxicillin Itching    Has patient had a PCN reaction causing immediate rash, facial/tongue/throat swelling, SOB or lightheadedness with hypotension: yes Has patient had a PCN reaction causing severe rash involving mucus membranes or skin necrosis: no Has patient had a PCN reaction that required hospitalization: no Has patient had a PCN reaction occurring within the last 10 years: yes If all of the above answers are "NO", then may proceed with Cephalosporin use.  Marland Kitchen Lisinopril Swelling    Angioedema   . Hydrocodone Other (See Comments)    Depressed   . Tegretol [Carbamazepine] Swelling    Throat swells    Metabolic Disorder Labs: Lab Results  Component Value Date   HGBA1C 6.1 (H) 06/21/2019   MPG 163 11/05/2017   MPG 166 05/21/2017   No results found for:  PROLACTIN Lab Results  Component Value Date   CHOL 158 05/21/2017   TRIG 161 (H) 05/21/2017   HDL 44 05/21/2017   CHOLHDL 3.6 05/21/2017   VLDL 32 05/21/2017   LDLCALC 82 05/21/2017     Current Medications: Current Outpatient Medications  Medication Sig Dispense Refill  . ARIPiprazole (ABILIFY) 5 MG tablet Take 1 tablet (5 mg total) by mouth daily. 30 tablet 3  . atorvastatin (LIPITOR) 10 MG tablet Take 1 tablet (10 mg total) by mouth daily at 6 PM. 90 tablet 3  . Blood Glucose Monitoring  Suppl KIT Please supply kit covered by insurance and supplies for testing four times daily. E11.9 E10.9 1 each 0  . Blood Pressure Monitor KIT Take blood pressure reading twice a day for the next 2 weeks and then once a day. 1 each 0  . carvedilol (COREG) 3.125 MG tablet TAKE 1 TABLET (3.125 MG TOTAL) BY MOUTH DAILY. 30 tablet 1  . lamoTRIgine (LAMICTAL) 25 MG tablet 1 tablet twice daily for 2 weeks then take 2 tablets twice daily for 2 weeks, then take 3 tablets twice daily 180 tablet 2  . sitaGLIPtin (JANUVIA) 50 MG tablet Take 1 tablet by mouth daily. 90 tablet 1  . torsemide (DEMADEX) 10 MG tablet Take 1 tablet (10 mg total) by mouth daily. 90 tablet 1  . triamcinolone (KENALOG) 0.1 % paste Use as directed 1 application in the mouth or throat 2 (two) times daily. (Patient not taking: Reported on 08/23/2019) 5 g 0  . Vitamin D, Ergocalciferol, (DRISDOL) 1.25 MG (50000 UT) CAPS capsule TAKE 1 CAPSULE (50,000 UNITS TOTAL) BY MOUTH EVERY 7 (SEVEN) DAYS. (Patient taking differently: Take 50,000 Units by mouth every 7 (seven) days. Restart today will pick up at local drug store) 5 capsule 0   No current facility-administered medications for this visit.     Neurologic: Headache: No Seizure: Yes Paresthesias:No  Musculoskeletal: Strength & Muscle Tone: within normal limits Gait & Station: normal Patient leans: Backward and N/A  Psychiatric Specialty Exam: ROS  There were no vitals taken for this  visit.There is no height or weight on file to calculate BMI.  General Appearance: Casual  Eye Contact:  Fair  Speech:  Slurred  Volume:  Normal  Mood:  Dysphoric  Affect:  Appropriate  Thought Process:  Goal Directed  Orientation:  Full (Time, Place, and Person)  Thought Content:  Logical  Suicidal Thoughts:  No  Homicidal Thoughts:  No  Memory:  Negative  Judgement:  Fair  Insight:  Lacking  Psychomotor Activity:  Decreased  Concentration:    Recall:  [place] of Knowledge:Fair  Language: Fair  Akathisia:  No  Handed:    AIMS (if indicated):    Assets:  Desire for Improvement  ADL's:  Intact  Cognition: WNL  Sleep:      Treatment Plan Summary: 9/30/20203:36 PM   This patient is #1 problem is that of a mood disorder related to her CVA.  It is hard to define clear episodes of major depression or mania.  She takes Lamictal for seizure control and I suspect it does help her mood somewhat.  She takes Abilify 5 mg which I am not sure is beneficial.  When her next visit we shall consider discontinuing the Abilify.  Initially we are using for impulsivity but at this point I am interested in discontinuing it.  The patient has never shown any evidence of tardive dyskinesia.  She will be seen again in 10 weeks with Brandi Gomez.  I think her behavior is better and that she is not attempting to elope on her own.

## 2019-09-15 DIAGNOSIS — G4733 Obstructive sleep apnea (adult) (pediatric): Secondary | ICD-10-CM | POA: Diagnosis not present

## 2019-09-15 DIAGNOSIS — R569 Unspecified convulsions: Secondary | ICD-10-CM | POA: Diagnosis not present

## 2019-09-15 DIAGNOSIS — M545 Low back pain: Secondary | ICD-10-CM | POA: Diagnosis not present

## 2019-09-19 ENCOUNTER — Other Ambulatory Visit: Payer: Self-pay | Admitting: Neurology

## 2019-09-19 ENCOUNTER — Other Ambulatory Visit: Payer: Self-pay | Admitting: Emergency Medicine

## 2019-09-19 NOTE — Telephone Encounter (Signed)
Requested medication (s) are due for refill today: yes  Requested medication (s) are on the active medication list: yes  Last refill: 08/22/2019  #5  0 refills  Future visit scheduled yes  Notes to clinic:not delegated  Requested Prescriptions  Pending Prescriptions Disp Refills   Vitamin D, Ergocalciferol, (DRISDOL) 1.25 MG (50000 UT) CAPS capsule [Pharmacy Med Name: VITAMIN D2 1.25MG (50,000 UNIT)] 5 capsule 0    Sig: TAKE 1 CAPSULE (50,000 UNITS TOTAL) BY MOUTH EVERY 7 (SEVEN) DAYS.     Endocrinology:  Vitamins - Vitamin D Supplementation Failed - 09/19/2019  1:02 AM      Failed - 50,000 IU strengths are not delegated      Failed - Phosphate in normal range and within 360 days    Phosphorus  Date Value Ref Range Status  06/04/2017 2.6 2.5 - 4.6 mg/dL Final         Failed - Vitamin D in normal range and within 360 days    Vit D, 25-Hydroxy  Date Value Ref Range Status  06/21/2019 17.7 (L) 30.0 - 100.0 ng/mL Final    Comment:    Vitamin D deficiency has been defined by the Institute of Medicine and an Endocrine Society practice guideline as a level of serum 25-OH vitamin D less than 20 ng/mL (1,2). The Endocrine Society went on to further define vitamin D insufficiency as a level between 21 and 29 ng/mL (2). 1. IOM (Institute of Medicine). 2010. Dietary reference    intakes for calcium and D. [place]: The    [employer]. 2. Holick MF, Binkley , Bischoff-Ferrari HA, et al.    Evaluation, treatment, and prevention of vitamin D    deficiency: an Endocrine Society clinical practice    guideline. JCEM. 2011 Jul; 96(7):1911-30.          Passed - Ca in normal range and within 360 days    Calcium  Date Value Ref Range Status  08/28/2019 8.9 8.9 - 10.3 mg/dL Final         Passed - Valid encounter within last 12 months    Recent Outpatient Visits          1 month ago Chronic pelvic pain in female   [facility] at [place], [place], MD   3  months ago Controlled type 2 diabetes mellitus without complication, without long-term current use of insulin [facility] [address])   Primary Care at [facility], [place], MD   4 months ago Controlled type 2 diabetes mellitus without complication, without long-term current use of insulin [facility])   Primary Care at [facility], [place], MD   4 months ago Controlled type 2 diabetes mellitus without complication, without long-term current use of insulin [facility]   Primary Care at [facility], [place], MD   10 months ago Left hip pain   Primary Care at [facility], Ines Bloomer, MD      Future Appointments            In 4 months [place], Ines Bloomer, MD Primary Care at [place], [facility]

## 2019-09-20 ENCOUNTER — Ambulatory Visit (HOSPITAL_COMMUNITY)
Admission: RE | Admit: 2019-09-20 | Discharge: 2019-09-20 | Disposition: A | Discharge status: Home / Self Care | Payer: Medicare HMO | Attending: Psychiatry | Admitting: Psychiatry

## 2019-09-20 DIAGNOSIS — Z8673 Personal history of transient ischemic attack (TIA), and cerebral infarction without residual deficits: Secondary | ICD-10-CM | POA: Diagnosis not present

## 2019-09-20 DIAGNOSIS — E119 Type 2 diabetes mellitus without complications: Secondary | ICD-10-CM | POA: Insufficient documentation

## 2019-09-20 DIAGNOSIS — G4730 Sleep apnea, unspecified: Secondary | ICD-10-CM | POA: Insufficient documentation

## 2019-09-20 DIAGNOSIS — I10 Essential (primary) hypertension: Secondary | ICD-10-CM | POA: Diagnosis not present

## 2019-09-20 DIAGNOSIS — F332 Major depressive disorder, recurrent severe without psychotic features: Secondary | ICD-10-CM | POA: Diagnosis not present

## 2019-09-20 DIAGNOSIS — Z915 Personal history of self-harm: Secondary | ICD-10-CM | POA: Insufficient documentation

## 2019-09-20 DIAGNOSIS — Z91410 Personal history of adult physical and sexual abuse: Secondary | ICD-10-CM | POA: Diagnosis not present
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
## 2019-09-20 NOTE — H&P (Signed)
Behavioral Health Medical Screening Exam  Brandi Gomez is an 48 y.o. female who presents to [facility] as a walk-in voluntarily for psychiatric assessment. Patient reports she is here at behavioral health because she feels lonely. She denies SI, HI or AVH. She reports she was dating a guy name Brandi Gomez for over twenty years and two years ago, she broke up with him. States," I use to watch wrestling shows and there was a character on there that I never met but I started to like him because I thought we had somethings in common. I broke up with Brandi Gomez because I was hoping that I could met the wrestler but I decided that he was not for me. Me and Brandi Gomez are not together but we still live together and since I broke up with him, he has been leaving the house for several hours per day. I think he is dating someone else and that make me feel lonely." Patient does not identify any other stressors at this time. She again states that she is not suicidal and reports no prior suicide attempts. She states, " The last time I thought about that was 10 years ago and the last time I was admitted to the mental hospital was 5 years ago. " Patient is seeing Dr. Casimiro Needle. Per her most recent visit dated 09/08/2019, patient was interviewed with her boyfriend Brandi Gomez and Dr. Casimiro Needle noted that patient was doing a little bit better. It was also noted that patient discussed feelinglonely wanting a therapist to talk to. On the same day as Dr. Casimiro Needle evaluation, William.noted that patient did not appear anxious or depressed. Patient has declined to give verbal consent to speak with Brandi Gomez. Patient appears slightly depressed at this time but mostly when she speaks of Brandi Gomez and feeling lonely when he is not home. She  denies homicidal ideations or psychosis and there is no observation of psychosis. There is largely some cognitive limitations noted. She does have a history of stroke and seizure disorder and she endorses some memory  loss.        Total Time spent with patient: 20 minutes  Psychiatric Specialty Exam: Physical Exam  Vitals reviewed. Constitutional: She is oriented to person, place, and time.  Neurological: She is alert and oriented to person, place, and time.    Review of Systems  Psychiatric/Behavioral: Positive for depression. Negative for hallucinations, memory loss, substance abuse and suicidal ideas. The patient is not nervous/anxious and does not have insomnia.   All other systems reviewed and are negative.   Blood pressure (!) 152/96, pulse 99, temperature 98.3 F (36.8 C), temperature source Oral, resp. rate 18, SpO2 97 %.There is no height or weight on file to calculate BMI.  General Appearance: Fairly Groomed  Eye Contact:  Good  Speech:  Clear and Coherent and Normal Rate  Volume:  Normal  Mood:  Depressed  Affect:  Congruent  Thought Process:  Coherent, Linear and Descriptions of Associations: Intact  Orientation:  Full (Time, Place, and Person)  Thought Content:  Logical  Suicidal Thoughts:  No  Homicidal Thoughts:  No  Memory:  Immediate;   Fair Recent;   Fair  Judgement:  Fair  Insight:  Fair  Psychomotor Activity:  Normal  Concentration: Concentration: Fair and Attention Span: Fair  Recall:  [employer] of Knowledge:Fair  Language: Good  Akathisia:  Negative  Handed:  Right  AIMS (if indicated):     Assets:  Communication Skills Resilience  Sleep:  Musculoskeletal: Strength & Muscle Tone: within normal limits Gait & Station: normal Patient leans: N/A  Blood pressure (!) 152/96, pulse 99, temperature 98.3 F (36.8 C), temperature source Oral, resp. rate 18, SpO2 97 %.  Recommendations:  Based on my evaluation the patient does not appear to have an emergency medical condition.  Patient does not meet inpatient psychiatric hospitalization. I agree with Dr. Casimiro Needle in that a counselor/therpsuist would be beneficial. Patient was given resources for  outpatient therapy/ counseling. Spoke with TTS counselor who stated that an adult day program maybe helpful. TTS counselor will provide resources for both. Patient was strongly encouraged to follow-up with resources provided.    [name], NP 09/20/2019, 2:26 PM

## 2019-09-21 ENCOUNTER — Other Ambulatory Visit: Payer: Self-pay

## 2019-09-21 NOTE — Patient Outreach (Signed)
[facility]) Care Management  [place]  09/21/2019   KADIN BERA September 30, 1971 [id number]  Subjective: Telephone call to patient for check in. Patient states that her blood pressures are some better.  Most recent reading 170/79. Reviewed with patient target to get BP less than 140/80.  Patient is writing them down and checking twice a day.  Encouraged patient to continue.  Advised patient on diet.  She verbalized understanding.    Objective:   Encounter Medications:  Outpatient Encounter Medications as of 09/21/2019  Medication Sig  . ARIPiprazole (ABILIFY) 5 MG tablet Take 1 tablet (5 mg total) by mouth daily.  Marland Kitchen atorvastatin (LIPITOR) 10 MG tablet Take 1 tablet (10 mg total) by mouth daily at 6 PM.  . Blood Glucose Monitoring Suppl KIT Please supply kit covered by insurance and supplies for testing four times daily. E11.9 E10.9  . Blood Pressure Monitor KIT Take blood pressure reading twice a day for the next 2 weeks and then once a day.  . carvedilol (COREG) 3.125 MG tablet TAKE 1 TABLET (3.125 MG TOTAL) BY MOUTH DAILY.  Marland Kitchen lamoTRIgine (LAMICTAL) 25 MG tablet TAKE 3 TABLETS TWICE DAILY  . sitaGLIPtin (JANUVIA) 50 MG tablet Take 1 tablet by mouth daily.  Marland Kitchen torsemide (DEMADEX) 10 MG tablet Take 1 tablet (10 mg total) by mouth daily.  Marland Kitchen triamcinolone (KENALOG) 0.1 % paste Use as directed 1 application in the mouth or throat 2 (two) times daily. (Patient not taking: Reported on 08/23/2019)  . Vitamin D, Ergocalciferol, (DRISDOL) 1.25 MG (50000 UT) CAPS capsule TAKE 1 CAPSULE (50,000 UNITS TOTAL) BY MOUTH EVERY 7 (SEVEN) DAYS. (Patient taking differently: Take 50,000 Units by mouth every 7 (seven) days. Restart today will pick up at local drug store)   No facility-administered encounter medications on file as of 09/21/2019.     Functional Status:  In your present state of health, do you have any difficulty performing the following activities: 09/01/2019  07/15/2019  Hearing? N -  [place]? N -  Comment - -  Difficulty concentrating or making decisions? N -  Comment - pt reports memory loss due to seizures  Walking or climbing stairs? N -  Dressing or bathing? N -  Doing errands, shopping? Y -  Comment Pt has assistance for this task -  Preparing Food and eating ? N -  Using the Toilet? Y -  Comment Occassional leaks due to medication -  In the past six months, have you accidently leaked urine? N -  Do you have problems with loss of bowel control? N -  Managing your Medications? Y -  Comment daughter Levonne Hubert with this task -  Managing your Finances? N -  Housekeeping or managing your Housekeeping? N -  Some encounter information is confidential and restricted. Go to Review Flowsheets activity to see all data.  Some recent data might be hidden    Fall/Depression Screening: Fall Risk  09/01/2019 08/09/2019 06/21/2019  Falls in the past year? 0 0 0  Number falls in past yr: - - -  Injury with Fall? - - -  Comment - - -  Follow up - Falls evaluation completed Falls evaluation completed   PHQ 2/9 Scores 09/01/2019 08/09/2019 06/21/2019 05/12/2019 05/06/2019 11/11/2018 05/12/2018  PHQ - 2 Score 0 0 0 0 0 0 1    Assessment: Patient continues to monitor blood pressure. Patient not following diet all the time.  Patient encouraged to follow low salt  diet and watch her sugars and starchy foods as well.    Plan:  [facility] CM Care Plan Problem One     Most Recent Value  Care Plan Problem One  Deficient knowledge related to Hypertension  Role Documenting the Problem One  Care Management Telephonic Coordinator  Care Plan for Problem One  Active  THN Long Term Goal   Pt will verbalized two symptoms of elevated BP within 90 days  THN Long Term Goal Start Date  08/23/19  Interventions for Problem One Long Term Goal  RN CM discussed signs of elevated BP.  Discussed educational material with patient for BP management.  THN CM Short Term Goal #1    Pt will monitor her BP twice weekly and document readings in calendar for providers to view within the next 30 days.  THN CM Short Term Goal #1 Start Date  08/23/19  THN CM Short Term Goal #1 Met Date  09/21/19  THN CM Short Term Goal #2   Pt will improve her dietary intake with decrease sodium and consume a more healthier diet within the next 30 days  THN CM Short Term Goal #2 Start Date  08/23/19  Interventions for Short Term Goal #2  RN CM discussed with patient importance of low salt diet in controlling her BP.  Encouraged diet adherence.     RN CM will outreach patient next month and patient agrees.   [name], RN, MSN [place] Management Care Management Coordinator Direct Line [phone] Cell [phone] Toll Free: [phone]  Fax: [phone]

## 2019-09-22 ENCOUNTER — Other Ambulatory Visit: Payer: Self-pay | Admitting: Emergency Medicine

## 2019-09-22 ENCOUNTER — Ambulatory Visit: Payer: Medicare HMO | Admitting: *Deleted

## 2019-09-22 ENCOUNTER — Ambulatory Visit: Payer: Medicare HMO

## 2019-09-22 NOTE — Telephone Encounter (Signed)
Pt was last seen 06/21/19 and vit d levels were low then.  No current vit d Levels since then and pt requesting refill.  Do you want to do labs?  Next f/u appt is on 02/04/20  Please advise on Vit D refill request.

## 2019-09-30 ENCOUNTER — Ambulatory Visit: Payer: Medicare HMO | Admitting: *Deleted

## 2019-10-11 ENCOUNTER — Inpatient Hospital Stay (HOSPITAL_COMMUNITY)
Admission: EM | Admit: 2019-10-11 | Discharge: 2019-10-15 | DRG: 065 | Disposition: A | Discharge status: Rehabilitation Facility | Payer: Medicare HMO | Attending: Internal Medicine | Admitting: Internal Medicine

## 2019-10-11 ENCOUNTER — Encounter (HOSPITAL_COMMUNITY): Payer: Self-pay

## 2019-10-11 DIAGNOSIS — I429 Cardiomyopathy, unspecified: Secondary | ICD-10-CM | POA: Diagnosis present

## 2019-10-11 DIAGNOSIS — E785 Hyperlipidemia, unspecified: Secondary | ICD-10-CM | POA: Diagnosis present

## 2019-10-11 DIAGNOSIS — F319 Bipolar disorder, unspecified: Secondary | ICD-10-CM | POA: Diagnosis present

## 2019-10-11 DIAGNOSIS — R531 Weakness: Secondary | ICD-10-CM | POA: Diagnosis not present

## 2019-10-11 DIAGNOSIS — Z79899 Other long term (current) drug therapy: Secondary | ICD-10-CM

## 2019-10-11 DIAGNOSIS — G40919 Epilepsy, unspecified, intractable, without status epilepticus: Secondary | ICD-10-CM | POA: Diagnosis not present

## 2019-10-11 DIAGNOSIS — Z20828 Contact with and (suspected) exposure to other viral communicable diseases: Secondary | ICD-10-CM | POA: Diagnosis present

## 2019-10-11 DIAGNOSIS — Y9301 Activity, walking, marching and hiking: Secondary | ICD-10-CM | POA: Diagnosis present

## 2019-10-11 DIAGNOSIS — N183 Chronic kidney disease, stage 3 unspecified: Secondary | ICD-10-CM | POA: Diagnosis present

## 2019-10-11 DIAGNOSIS — E1142 Type 2 diabetes mellitus with diabetic polyneuropathy: Secondary | ICD-10-CM | POA: Diagnosis present

## 2019-10-11 DIAGNOSIS — Z818 Family history of other mental and behavioral disorders: Secondary | ICD-10-CM | POA: Diagnosis not present

## 2019-10-11 DIAGNOSIS — Z885 Allergy status to narcotic agent status: Secondary | ICD-10-CM

## 2019-10-11 DIAGNOSIS — Y92830 Public park as the place of occurrence of the external cause: Secondary | ICD-10-CM

## 2019-10-11 DIAGNOSIS — I639 Cerebral infarction, unspecified: Secondary | ICD-10-CM

## 2019-10-11 DIAGNOSIS — I130 Hypertensive heart and chronic kidney disease with heart failure and stage 1 through stage 4 chronic kidney disease, or unspecified chronic kidney disease: Secondary | ICD-10-CM | POA: Diagnosis present

## 2019-10-11 DIAGNOSIS — I69351 Hemiplegia and hemiparesis following cerebral infarction affecting right dominant side: Secondary | ICD-10-CM | POA: Diagnosis present

## 2019-10-11 DIAGNOSIS — Z7902 Long term (current) use of antithrombotics/antiplatelets: Secondary | ICD-10-CM

## 2019-10-11 DIAGNOSIS — F70 Mild intellectual disabilities: Secondary | ICD-10-CM | POA: Diagnosis present

## 2019-10-11 DIAGNOSIS — Z6841 Body Mass Index (BMI) 40.0 and over, adult: Secondary | ICD-10-CM | POA: Diagnosis not present

## 2019-10-11 DIAGNOSIS — E559 Vitamin D deficiency, unspecified: Secondary | ICD-10-CM | POA: Diagnosis present

## 2019-10-11 DIAGNOSIS — Z7984 Long term (current) use of oral hypoglycemic drugs: Secondary | ICD-10-CM

## 2019-10-11 DIAGNOSIS — G4733 Obstructive sleep apnea (adult) (pediatric): Secondary | ICD-10-CM | POA: Diagnosis present

## 2019-10-11 DIAGNOSIS — G4700 Insomnia, unspecified: Secondary | ICD-10-CM | POA: Diagnosis present

## 2019-10-11 DIAGNOSIS — I63422 Cerebral infarction due to embolism of left anterior cerebral artery: Secondary | ICD-10-CM | POA: Diagnosis present

## 2019-10-11 DIAGNOSIS — I63521 Cerebral infarction due to unspecified occlusion or stenosis of right anterior cerebral artery: Secondary | ICD-10-CM | POA: Diagnosis not present

## 2019-10-11 DIAGNOSIS — G40209 Localization-related (focal) (partial) symptomatic epilepsy and epileptic syndromes with complex partial seizures, not intractable, without status epilepticus: Secondary | ICD-10-CM | POA: Diagnosis present

## 2019-10-11 DIAGNOSIS — Z8249 Family history of ischemic heart disease and other diseases of the circulatory system: Secondary | ICD-10-CM | POA: Diagnosis not present

## 2019-10-11 DIAGNOSIS — K5900 Constipation, unspecified: Secondary | ICD-10-CM | POA: Diagnosis present

## 2019-10-11 DIAGNOSIS — Z7982 Long term (current) use of aspirin: Secondary | ICD-10-CM

## 2019-10-11 DIAGNOSIS — Z880 Allergy status to penicillin: Secondary | ICD-10-CM | POA: Diagnosis not present

## 2019-10-11 DIAGNOSIS — Z8041 Family history of malignant neoplasm of ovary: Secondary | ICD-10-CM

## 2019-10-11 DIAGNOSIS — Z888 Allergy status to other drugs, medicaments and biological substances status: Secondary | ICD-10-CM

## 2019-10-11 DIAGNOSIS — W19XXXA Unspecified fall, initial encounter: Secondary | ICD-10-CM | POA: Diagnosis present

## 2019-10-11 DIAGNOSIS — R001 Bradycardia, unspecified: Secondary | ICD-10-CM | POA: Diagnosis not present

## 2019-10-11 DIAGNOSIS — N1831 Chronic kidney disease, stage 3a: Secondary | ICD-10-CM | POA: Diagnosis not present

## 2019-10-11 DIAGNOSIS — D631 Anemia in chronic kidney disease: Secondary | ICD-10-CM | POA: Diagnosis present

## 2019-10-11 DIAGNOSIS — I5023 Acute on chronic systolic (congestive) heart failure: Secondary | ICD-10-CM

## 2019-10-11 DIAGNOSIS — I6389 Other cerebral infarction: Secondary | ICD-10-CM | POA: Diagnosis not present

## 2019-10-11 DIAGNOSIS — Z833 Family history of diabetes mellitus: Secondary | ICD-10-CM

## 2019-10-11 DIAGNOSIS — G8311 Monoplegia of lower limb affecting right dominant side: Secondary | ICD-10-CM | POA: Diagnosis present

## 2019-10-11 DIAGNOSIS — F419 Anxiety disorder, unspecified: Secondary | ICD-10-CM | POA: Diagnosis present

## 2019-10-11 DIAGNOSIS — R569 Unspecified convulsions: Secondary | ICD-10-CM | POA: Diagnosis not present

## 2019-10-11 DIAGNOSIS — E1122 Type 2 diabetes mellitus with diabetic chronic kidney disease: Secondary | ICD-10-CM | POA: Diagnosis present

## 2019-10-11 DIAGNOSIS — S0990XA Unspecified injury of head, initial encounter: Secondary | ICD-10-CM | POA: Diagnosis not present

## 2019-10-11 DIAGNOSIS — I6529 Occlusion and stenosis of unspecified carotid artery: Secondary | ICD-10-CM | POA: Diagnosis present

## 2019-10-11 DIAGNOSIS — I5042 Chronic combined systolic (congestive) and diastolic (congestive) heart failure: Secondary | ICD-10-CM | POA: Diagnosis present

## 2019-10-11 DIAGNOSIS — I10 Essential (primary) hypertension: Secondary | ICD-10-CM | POA: Diagnosis not present

## 2019-10-11 DIAGNOSIS — G40909 Epilepsy, unspecified, not intractable, without status epilepticus: Secondary | ICD-10-CM | POA: Diagnosis present

## 2019-10-11 DIAGNOSIS — G9349 Other encephalopathy: Secondary | ICD-10-CM | POA: Diagnosis present

## 2019-10-11 DIAGNOSIS — Z806 Family history of leukemia: Secondary | ICD-10-CM | POA: Diagnosis not present

## 2019-10-11 DIAGNOSIS — R29810 Facial weakness: Secondary | ICD-10-CM | POA: Diagnosis not present

## 2019-10-11 DIAGNOSIS — M5126 Other intervertebral disc displacement, lumbar region: Secondary | ICD-10-CM | POA: Diagnosis not present

## 2019-10-11 DIAGNOSIS — E1151 Type 2 diabetes mellitus with diabetic peripheral angiopathy without gangrene: Secondary | ICD-10-CM | POA: Diagnosis present

## 2019-10-11 LAB — CBC WITH DIFFERENTIAL/PLATELET
Abs Immature Granulocytes: 0.03 10*3/uL (ref 0.00–0.07)
Basophils Absolute: 0 10*3/uL (ref 0.0–0.1)
Basophils Relative: 0 %
Eosinophils Absolute: 0.2 10*3/uL (ref 0.0–0.5)
Eosinophils Relative: 2 %
HCT: 33.2 % — ABNORMAL LOW (ref 36.0–46.0)
Hemoglobin: 10.4 g/dL — ABNORMAL LOW (ref 12.0–15.0)
Immature Granulocytes: 0 %
Lymphocytes Relative: 33 %
Lymphs Abs: 2.7 10*3/uL (ref 0.7–4.0)
MCH: 28.8 pg (ref 26.0–34.0)
MCHC: 31.3 g/dL (ref 30.0–36.0)
MCV: 92 fL (ref 80.0–100.0)
Monocytes Absolute: 0.5 10*3/uL (ref 0.1–1.0)
Monocytes Relative: 6 %
Neutro Abs: 4.8 10*3/uL (ref 1.7–7.7)
Neutrophils Relative %: 59 %
Platelets: 350 10*3/uL (ref 150–400)
RBC: 3.61 MIL/uL — ABNORMAL LOW (ref 3.87–5.11)
RDW: 14.6 % (ref 11.5–15.5)
WBC: 8.2 10*3/uL (ref 4.0–10.5)
nRBC: 0 % (ref 0.0–0.2)

## 2019-10-11 NOTE — ED Provider Notes (Signed)
[place] EMERGENCY DEPARTMENT Provider Note   CSN: [id number] Arrival date & time: 10/11/19  2309     History   Chief Complaint Chief Complaint  Patient presents with  . Weakness    HPI Brandi Gomez is a 48 y.o. female.     Patient is a 48 year old female with past medical history of diabetes, hypertension, bipolar, prior CVA, seizures, and obesity.  She presents today with complaints of right leg weakness.  This began approximately 6 hours ago.  Patient was walking through a park to get to a pond to go fishing when she states her right leg became weak and caused her to fall.  She describes weakness and numbness of the right leg from the hip down to the foot.  She denies any involvement of the face or arm.  She denies any back pain or bowel or bladder complaints.  The history is provided by the patient.  Weakness Severity:  Moderate Onset quality:  Sudden Timing:  Constant Progression:  Unchanged Chronicity:  New Relieved by:  Nothing Worsened by:  Nothing Ineffective treatments:  None tried   Past Medical History:  Diagnosis Date  . Anemia   . Anxiety   . Bipolar 1 disorder ([place])   . Common migraine 05/19/2015  . Depression   . Diabetes mellitus, type II ([place])   . Hypertension   . Irritable bowel syndrome (IBS)   . Mild mental retardation   . Obesity   . Partial complex seizure disorder with intractable epilepsy ([place]) 05/12/2014  . Seizures ([place])    intractable, sz 08/23/17  . Sleep apnea   . Stroke ([place])   . Type II or unspecified type diabetes mellitus without mention of complication, not stated as uncontrolled     Patient Active Problem List   Diagnosis Date Noted  . Depression 07/14/2019  . Bipolar disorder ([place]) 05/13/2019  . Bipolar 1 disorder ([place]) 05/13/2019  . Adjustment disorder with anxiety   . History of recent stroke 08/06/2017  . OSA (obstructive sleep apnea)   . Cryptogenic stroke ([place]) 05/20/2017  . Controlled type  2 diabetes mellitus without complication, without long-term current use of insulin ([place]) 05/08/2017  . Essential hypertension 07/27/2008  . Seizure disorder ([place]) 02/05/2007    Past Surgical History:  Procedure Laterality Date  . COLONOSCOPY     2012-normal , Dr Sharlett Iles  . ESOPHAGOGASTRODUODENOSCOPY     normal-Dr Patterson 2012  . LOOP RECORDER INSERTION N/A 05/30/2017   Procedure: Loop Recorder Insertion;  Surgeon: Constance Haw, MD;  Location: [place] CV LAB;  Service: Cardiovascular;  Laterality: N/A;  . LOOP RECORDER REMOVAL N/A 03/04/2018   Procedure: LOOP RECORDER REMOVAL;  Surgeon: Constance Haw, MD;  Location: [place] CV LAB;  Service: Cardiovascular;  Laterality: N/A;  . MYRINGOTOMY WITH TUBE PLACEMENT    . MYRINGOTOMY WITH TUBE PLACEMENT Right 11/05/2017   Procedure: MYRINGOTOMY WITH TUBE PLACEMENT;  Surgeon: Melissa Montane, MD;  Location: [facility];  Service: ENT;  Laterality: Right;  right T Tube placement  . NASAL SINUS SURGERY    . TEE WITHOUT CARDIOVERSION N/A 05/30/2017   Procedure: TRANSESOPHAGEAL ECHOCARDIOGRAM (TEE);  Surgeon: Acie Fredrickson Wonda Cheng, MD;  Location: [facility] ENDOSCOPY;  Service: Cardiovascular;  Laterality: N/A;     OB History   No obstetric history on file.      Home Medications    Prior to Admission medications   Medication Sig Start Date End Date Taking? Authorizing Provider  ARIPiprazole (  ABILIFY) 5 MG tablet Take 1 tablet (5 mg total) by mouth daily. 09/08/19   Plovsky, Berneta Sages, MD  atorvastatin (LIPITOR) 10 MG tablet Take 1 tablet (10 mg total) by mouth daily at 6 PM. 05/12/19 08/23/19  Horald Pollen, MD  Blood Glucose Monitoring Suppl KIT Please supply kit covered by insurance and supplies for testing four times daily. E11.9 E10.9 05/12/19   Horald Pollen, MD  Blood Pressure Monitor KIT Take blood pressure reading twice a day for the next 2 weeks and then once a day. 05/12/19   Horald Pollen, MD  carvedilol (COREG) 3.125  MG tablet TAKE 1 TABLET (3.125 MG TOTAL) BY MOUTH DAILY. 05/18/19   Horald Pollen, MD  lamoTRIgine (LAMICTAL) 25 MG tablet TAKE 3 TABLETS TWICE DAILY 09/20/19   [name], NP  sitaGLIPtin (JANUVIA) 50 MG tablet Take 1 tablet by mouth daily. 08/11/19   Horald Pollen, MD  triamcinolone (KENALOG) 0.1 % paste Use as directed 1 application in the mouth or throat 2 (two) times daily. Patient not taking: Reported on 08/23/2019 07/26/19   Augusto Gamble B, NP  Vitamin D, Ergocalciferol, (DRISDOL) 1.25 MG (50000 UT) CAPS capsule TAKE 1 CAPSULE (50,000 UNITS TOTAL) BY MOUTH EVERY 7 (SEVEN) DAYS. 09/22/19   Horald Pollen, MD    Family History Family History  Problem Relation Age of Onset  . Diabetes Mother        passed away from accidental death  . Hypertension Mother   . Bipolar disorder Father   . Diabetes Daughter   . Leukemia Daughter   . Ovarian cancer Maternal Aunt     Social History Social History   Tobacco Use  . Smoking status: Never Smoker  . Smokeless tobacco: Never Used  Substance Use Topics  . Alcohol use: No  . Drug use: No     Allergies   Amoxicillin, Lisinopril, Hydrocodone, and Tegretol [carbamazepine]   Review of Systems Review of Systems  Neurological: Positive for weakness.  All other systems reviewed and are negative.    Physical Exam Updated Vital Signs BP 135/80   Pulse 66   Temp 98.8 F (37.1 C) (Oral)   Resp 19   Ht '5\' 1"'$  (1.549 m)   Wt 117.9 kg   SpO2 100%   BMI 49.13 kg/m   Physical Exam Vitals signs and nursing note reviewed.  Constitutional:      General: She is not in acute distress.    Appearance: She is well-developed. She is not diaphoretic.  HENT:     Head: Normocephalic and atraumatic.  Eyes:     Extraocular Movements: Extraocular movements intact.     Pupils: Pupils are equal, round, and reactive to light.  Neck:     Musculoskeletal: Normal range of motion and neck supple.  Cardiovascular:     Rate  and Rhythm: Normal rate and regular rhythm.     Heart sounds: No murmur. No friction rub. No gallop.   Pulmonary:     Effort: Pulmonary effort is normal. No respiratory distress.     Breath sounds: Normal breath sounds. No wheezing.  Abdominal:     General: Bowel sounds are normal. There is no distension.     Palpations: Abdomen is soft.     Tenderness: There is no abdominal tenderness.  Musculoskeletal: Normal range of motion.  Skin:    General: Skin is warm and dry.  Neurological:     Mental Status: She is alert and oriented to  person, place, and time.     Cranial Nerves: No cranial nerve deficit.     Sensory: No sensory deficit.     Motor: No weakness.     Coordination: Coordination normal.     Comments: To my exam, strength appears equal in both lower extremities.  Strength is 5 out of 5 and DTRs are trace and symmetrical in the patellar and Achilles tendons bilaterally.  Sensation is intact and equal to both legs.      ED Treatments / Results  Labs (all labs ordered are listed, but only abnormal results are displayed) Labs Reviewed - No data to display  EKG EKG Interpretation  Date/Time:  Monday October 11 2019 23:11:44 EST Ventricular Rate:  66 PR Interval:    QRS Duration: 88 QT Interval:  412 QTC Calculation: 432 R Axis:   93 Text Interpretation: Sinus rhythm Borderline right axis deviation Low voltage, precordial leads Borderline T abnormalities, anterior leads Confirmed by Veryl Speak [phone]) on 10/11/2019 11:16:15 PM   Radiology No results found.  Procedures Procedures (including critical care time)  Medications Ordered in ED Medications - No data to display   Initial Impression / Assessment and Plan / ED Course  I have reviewed the triage vital signs and the nursing notes.  Pertinent labs & imaging results that were available during my care of the patient were reviewed by me and considered in my medical decision making (see chart for details).   Patient presenting here with complaints of right leg weakness that began approximately 6 hours prior to presentation.  She states that her leg became weak and it caused her to fall when she was walking through the park.  Work-up was initiated including imaging studies of the lumbar spine and brain as I was unsure as to whether the symptoms were originating from her back or possibly a stroke.  The lumbar spine showed no explanation for her symptoms, however head CT did show what appears to be an acute CVA of the left frontal lobe.  This finding was discussed with Dr. Cheral Marker from neurology who is recommending admission to the hospitalist service.  I have spoken with Dr. Marlowe Sax who agrees to admit.  CRITICAL CARE Performed by: Veryl Speak Total critical care time: 35 minutes Critical care time was exclusive of separately billable procedures and treating other patients. Critical care was necessary to treat or prevent imminent or life-threatening deterioration. Critical care was time spent personally by me on the following activities: development of treatment plan with patient and/or surrogate as well as nursing, discussions with consultants, evaluation of patient's response to treatment, examination of patient, obtaining history from patient or surrogate, ordering and performing treatments and interventions, ordering and review of laboratory studies, ordering and review of radiographic studies, pulse oximetry and re-evaluation of patient's condition.   Final Clinical Impressions(s) / ED Diagnoses   Final diagnoses:  None    ED Discharge Orders    None       Veryl Speak, MD 10/12/19 [phone]

## 2019-10-11 NOTE — ED Triage Notes (Signed)
Pt BIB [facility] from home. 6 HRS ago pt was ambulating and had weakness in R. Leg and fell. No LOC or injuries noted. Pt went home and still had weakness and decided to be seen.  No other neuro deficits noted. No injuires noted.   VSS with EMS. BP in the [id number] systolic. CBG was 120

## 2019-10-12 ENCOUNTER — Emergency Department (HOSPITAL_COMMUNITY): Payer: Medicare HMO

## 2019-10-12 ENCOUNTER — Observation Stay (HOSPITAL_COMMUNITY): Payer: Medicare HMO

## 2019-10-12 ENCOUNTER — Other Ambulatory Visit: Payer: Self-pay

## 2019-10-12 ENCOUNTER — Inpatient Hospital Stay (HOSPITAL_COMMUNITY): Payer: Medicare HMO

## 2019-10-12 DIAGNOSIS — I255 Ischemic cardiomyopathy: Secondary | ICD-10-CM

## 2019-10-12 DIAGNOSIS — N1831 Chronic kidney disease, stage 3a: Secondary | ICD-10-CM | POA: Diagnosis not present

## 2019-10-12 DIAGNOSIS — Z880 Allergy status to penicillin: Secondary | ICD-10-CM | POA: Diagnosis not present

## 2019-10-12 DIAGNOSIS — W19XXXA Unspecified fall, initial encounter: Secondary | ICD-10-CM | POA: Diagnosis present

## 2019-10-12 DIAGNOSIS — I429 Cardiomyopathy, unspecified: Secondary | ICD-10-CM | POA: Diagnosis present

## 2019-10-12 DIAGNOSIS — Y9301 Activity, walking, marching and hiking: Secondary | ICD-10-CM | POA: Diagnosis present

## 2019-10-12 DIAGNOSIS — G4700 Insomnia, unspecified: Secondary | ICD-10-CM | POA: Diagnosis present

## 2019-10-12 DIAGNOSIS — I5042 Chronic combined systolic (congestive) and diastolic (congestive) heart failure: Secondary | ICD-10-CM | POA: Diagnosis present

## 2019-10-12 DIAGNOSIS — E1122 Type 2 diabetes mellitus with diabetic chronic kidney disease: Secondary | ICD-10-CM | POA: Diagnosis present

## 2019-10-12 DIAGNOSIS — Z79899 Other long term (current) drug therapy: Secondary | ICD-10-CM | POA: Diagnosis not present

## 2019-10-12 DIAGNOSIS — G9349 Other encephalopathy: Secondary | ICD-10-CM | POA: Diagnosis present

## 2019-10-12 DIAGNOSIS — I639 Cerebral infarction, unspecified: Secondary | ICD-10-CM

## 2019-10-12 DIAGNOSIS — Z833 Family history of diabetes mellitus: Secondary | ICD-10-CM | POA: Diagnosis not present

## 2019-10-12 DIAGNOSIS — G4733 Obstructive sleep apnea (adult) (pediatric): Secondary | ICD-10-CM

## 2019-10-12 DIAGNOSIS — M5126 Other intervertebral disc displacement, lumbar region: Secondary | ICD-10-CM | POA: Diagnosis not present

## 2019-10-12 DIAGNOSIS — I63521 Cerebral infarction due to unspecified occlusion or stenosis of right anterior cerebral artery: Secondary | ICD-10-CM | POA: Diagnosis not present

## 2019-10-12 DIAGNOSIS — N183 Chronic kidney disease, stage 3 unspecified: Secondary | ICD-10-CM | POA: Diagnosis present

## 2019-10-12 DIAGNOSIS — Z8249 Family history of ischemic heart disease and other diseases of the circulatory system: Secondary | ICD-10-CM | POA: Diagnosis not present

## 2019-10-12 DIAGNOSIS — Z20828 Contact with and (suspected) exposure to other viral communicable diseases: Secondary | ICD-10-CM | POA: Diagnosis present

## 2019-10-12 DIAGNOSIS — Z818 Family history of other mental and behavioral disorders: Secondary | ICD-10-CM | POA: Diagnosis not present

## 2019-10-12 DIAGNOSIS — Z6841 Body Mass Index (BMI) 40.0 and over, adult: Secondary | ICD-10-CM | POA: Diagnosis not present

## 2019-10-12 DIAGNOSIS — G40909 Epilepsy, unspecified, not intractable, without status epilepticus: Secondary | ICD-10-CM | POA: Diagnosis present

## 2019-10-12 DIAGNOSIS — I10 Essential (primary) hypertension: Secondary | ICD-10-CM | POA: Diagnosis not present

## 2019-10-12 DIAGNOSIS — F70 Mild intellectual disabilities: Secondary | ICD-10-CM | POA: Diagnosis present

## 2019-10-12 DIAGNOSIS — Y92830 Public park as the place of occurrence of the external cause: Secondary | ICD-10-CM | POA: Diagnosis not present

## 2019-10-12 DIAGNOSIS — Z806 Family history of leukemia: Secondary | ICD-10-CM | POA: Diagnosis not present

## 2019-10-12 DIAGNOSIS — I130 Hypertensive heart and chronic kidney disease with heart failure and stage 1 through stage 4 chronic kidney disease, or unspecified chronic kidney disease: Secondary | ICD-10-CM | POA: Diagnosis present

## 2019-10-12 DIAGNOSIS — E559 Vitamin D deficiency, unspecified: Secondary | ICD-10-CM | POA: Diagnosis present

## 2019-10-12 DIAGNOSIS — G40209 Localization-related (focal) (partial) symptomatic epilepsy and epileptic syndromes with complex partial seizures, not intractable, without status epilepticus: Secondary | ICD-10-CM | POA: Diagnosis present

## 2019-10-12 DIAGNOSIS — I63422 Cerebral infarction due to embolism of left anterior cerebral artery: Secondary | ICD-10-CM | POA: Diagnosis present

## 2019-10-12 DIAGNOSIS — I5023 Acute on chronic systolic (congestive) heart failure: Secondary | ICD-10-CM

## 2019-10-12 DIAGNOSIS — I6389 Other cerebral infarction: Secondary | ICD-10-CM

## 2019-10-12 DIAGNOSIS — E785 Hyperlipidemia, unspecified: Secondary | ICD-10-CM

## 2019-10-12 DIAGNOSIS — K5900 Constipation, unspecified: Secondary | ICD-10-CM | POA: Diagnosis present

## 2019-10-12 DIAGNOSIS — E1159 Type 2 diabetes mellitus with other circulatory complications: Secondary | ICD-10-CM

## 2019-10-12 DIAGNOSIS — Z7982 Long term (current) use of aspirin: Secondary | ICD-10-CM | POA: Diagnosis not present

## 2019-10-12 DIAGNOSIS — R569 Unspecified convulsions: Secondary | ICD-10-CM | POA: Diagnosis not present

## 2019-10-12 DIAGNOSIS — Z7984 Long term (current) use of oral hypoglycemic drugs: Secondary | ICD-10-CM | POA: Diagnosis not present

## 2019-10-12 DIAGNOSIS — G40919 Epilepsy, unspecified, intractable, without status epilepticus: Secondary | ICD-10-CM | POA: Diagnosis not present

## 2019-10-12 DIAGNOSIS — F319 Bipolar disorder, unspecified: Secondary | ICD-10-CM | POA: Diagnosis present

## 2019-10-12 DIAGNOSIS — I69351 Hemiplegia and hemiparesis following cerebral infarction affecting right dominant side: Secondary | ICD-10-CM | POA: Diagnosis present

## 2019-10-12 DIAGNOSIS — E1142 Type 2 diabetes mellitus with diabetic polyneuropathy: Secondary | ICD-10-CM | POA: Diagnosis present

## 2019-10-12 LAB — COMPREHENSIVE METABOLIC PANEL
ALT: 12 U/L (ref 0–44)
AST: 17 U/L (ref 15–41)
Albumin: 3.3 g/dL — ABNORMAL LOW (ref 3.5–5.0)
Alkaline Phosphatase: 75 U/L (ref 38–126)
Anion gap: 8 (ref 5–15)
BUN: 21 mg/dL — ABNORMAL HIGH (ref 6–20)
CO2: 26 mmol/L (ref 22–32)
Calcium: 8.5 mg/dL — ABNORMAL LOW (ref 8.9–10.3)
Chloride: 104 mmol/L (ref 98–111)
Creatinine, Ser: 1.52 mg/dL — ABNORMAL HIGH (ref 0.44–1.00)
GFR calc Af Amer: 46 mL/min — ABNORMAL LOW (ref >60)
GFR calc non Af Amer: 40 mL/min — ABNORMAL LOW (ref >60)
Glucose, Bld: 122 mg/dL — ABNORMAL HIGH (ref 70–99)
Potassium: 4.2 mmol/L (ref 3.5–5.1)
Sodium: 138 mmol/L (ref 135–145)
Total Bilirubin: 0.8 mg/dL (ref 0.3–1.2)
Total Protein: 6.9 g/dL (ref 6.5–8.1)

## 2019-10-12 LAB — RAPID URINE DRUG SCREEN, HOSP PERFORMED
Amphetamines: NOT DETECTED
Barbiturates: NOT DETECTED
Benzodiazepines: NOT DETECTED
Cocaine: NOT DETECTED
Opiates: NOT DETECTED
Tetrahydrocannabinol: NOT DETECTED

## 2019-10-12 LAB — LIPID PANEL
Cholesterol: 123 mg/dL (ref 0–200)
HDL: 49 mg/dL (ref >40)
LDL Cholesterol: 64 mg/dL (ref 0–99)
Total CHOL/HDL Ratio: 2.5 RATIO
Triglycerides: 49 mg/dL (ref <150)
VLDL: 10 mg/dL (ref 0–40)

## 2019-10-12 LAB — ECHOCARDIOGRAM COMPLETE
Height: 61 in
Weight: 4160 oz

## 2019-10-12 LAB — GLUCOSE, CAPILLARY
Glucose-Capillary: 150 mg/dL — ABNORMAL HIGH (ref 70–99)
Glucose-Capillary: 124 mg/dL — ABNORMAL HIGH (ref 70–99)
Glucose-Capillary: 126 mg/dL — ABNORMAL HIGH (ref 70–99)
Glucose-Capillary: 116 mg/dL — ABNORMAL HIGH (ref 70–99)

## 2019-10-12 LAB — HEMOGLOBIN A1C
Hgb A1c MFr Bld: 6.5 % — ABNORMAL HIGH (ref 4.8–5.6)
Mean Plasma Glucose: 139.85 mg/dL

## 2019-10-12 LAB — PROTIME-INR
INR: 1 (ref 0.8–1.2)
Prothrombin Time: 13.2 seconds (ref 11.4–15.2)

## 2019-10-12 LAB — SARS CORONAVIRUS 2 (TAT 6-24 HRS): SARS Coronavirus 2: NEGATIVE

## 2019-10-12 MED ORDER — ATORVASTATIN CALCIUM 40 MG PO TABS
40.0000 mg | ORAL_TABLET | Freq: Every day | ORAL | Status: DC
  Administered 2019-10-12 – 2019-10-14 (×3): 40 mg via ORAL
  Filled 2019-10-12 (×3): qty 1

## 2019-10-12 MED ORDER — ACETAMINOPHEN 325 MG PO TABS
650.0000 mg | ORAL_TABLET | ORAL | Status: DC | PRN
  Administered 2019-10-12: 650 mg via ORAL
  Filled 2019-10-12: qty 2

## 2019-10-12 MED ORDER — ACETAMINOPHEN 160 MG/5ML PO SOLN: 650.0000 mg | ORAL | Status: DC | PRN

## 2019-10-12 MED ORDER — INSULIN ASPART 100 UNIT/ML ~~LOC~~ SOLN
0.0000 [IU] | Freq: Three times a day (TID) | SUBCUTANEOUS | Status: DC
  Administered 2019-10-12 (×3): 1 [IU] via SUBCUTANEOUS
  Administered 2019-10-13 – 2019-10-14 (×2): 2 [IU] via SUBCUTANEOUS
  Administered 2019-10-15: 1 [IU] via SUBCUTANEOUS

## 2019-10-12 MED ORDER — ASPIRIN EC 81 MG PO TBEC
81.0000 mg | DELAYED_RELEASE_TABLET | Freq: Every day | ORAL | Status: DC
  Administered 2019-10-13 – 2019-10-15 (×3): 81 mg via ORAL
  Filled 2019-10-12 (×3): qty 1

## 2019-10-12 MED ORDER — ASPIRIN 325 MG PO TABS: 325.0000 mg | ORAL_TABLET | Freq: Every day | ORAL | Status: DC

## 2019-10-12 MED ORDER — INSULIN ASPART 100 UNIT/ML ~~LOC~~ SOLN: 0.0000 [IU] | Freq: Every day | SUBCUTANEOUS | Status: DC

## 2019-10-12 MED ORDER — STROKE: EARLY STAGES OF RECOVERY BOOK
Freq: Once | Status: AC
  Administered 2019-10-12: 04:00:00
  Filled 2019-10-12: qty 1

## 2019-10-12 MED ORDER — LAMOTRIGINE 25 MG PO TABS
75.0000 mg | ORAL_TABLET | Freq: Two times a day (BID) | ORAL | Status: DC
  Administered 2019-10-12 (×2): 75 mg via ORAL
  Filled 2019-10-12 (×4): qty 3

## 2019-10-12 MED ORDER — ARIPIPRAZOLE 10 MG PO TABS
10.0000 mg | ORAL_TABLET | Freq: Every day | ORAL | Status: DC
  Administered 2019-10-12 – 2019-10-15 (×4): 10 mg via ORAL
  Filled 2019-10-12 (×4): qty 1

## 2019-10-12 MED ORDER — ACETAMINOPHEN 650 MG RE SUPP: 650.0000 mg | RECTAL | Status: DC | PRN

## 2019-10-12 MED ORDER — ATORVASTATIN CALCIUM 80 MG PO TABS: 80.0000 mg | ORAL_TABLET | Freq: Every day | ORAL | Status: DC

## 2019-10-12 MED ORDER — VITAMIN D (ERGOCALCIFEROL) 1.25 MG (50000 UNIT) PO CAPS
50000.0000 [IU] | ORAL_CAPSULE | ORAL | Status: DC
  Administered 2019-10-12: 50000 [IU] via ORAL
  Filled 2019-10-12: qty 1

## 2019-10-12 MED ORDER — CLOPIDOGREL BISULFATE 75 MG PO TABS
75.0000 mg | ORAL_TABLET | Freq: Every day | ORAL | Status: DC
  Administered 2019-10-12 – 2019-10-15 (×4): 75 mg via ORAL
  Filled 2019-10-12 (×4): qty 1

## 2019-10-12 MED ORDER — ASPIRIN 81 MG PO CHEW
324.0000 mg | CHEWABLE_TABLET | Freq: Once | ORAL | Status: AC
  Administered 2019-10-12: 324 mg via ORAL
  Filled 2019-10-12: qty 4

## 2019-10-12 MED ORDER — SENNOSIDES-DOCUSATE SODIUM 8.6-50 MG PO TABS
1.0000 | ORAL_TABLET | Freq: Every evening | ORAL | Status: DC | PRN
  Administered 2019-10-15: 1 via ORAL
  Filled 2019-10-12: qty 1

## 2019-10-12 MED ORDER — ASPIRIN EC 81 MG PO TBEC: 81.0000 mg | DELAYED_RELEASE_TABLET | Freq: Every day | ORAL | Status: DC

## 2019-10-12 MED ORDER — ATORVASTATIN CALCIUM 40 MG PO TABS: 40.0000 mg | ORAL_TABLET | Freq: Every day | ORAL | Status: DC

## 2019-10-12 MED ORDER — ENOXAPARIN SODIUM 40 MG/0.4ML ~~LOC~~ SOLN
40.0000 mg | SUBCUTANEOUS | Status: DC
  Administered 2019-10-12 – 2019-10-15 (×4): 40 mg via SUBCUTANEOUS
  Filled 2019-10-12 (×4): qty 0.4

## 2019-10-12 NOTE — TOC Initial Note (Signed)
Transition of Care [facility]) - Initial/Assessment Note    Patient Details  Name: Brandi Gomez MRN: [id number] Date of Birth: Apr 22, 1971  Transition of [facility]) CM/SW Contact:    Benard Halsted, LCSW Phone Number: 10/12/2019, 6:05 PM  Clinical Narrative:                 Patient is a 48 year old female with a history of CKD stage III, type 2 diabetes mellitus, bipolar disorder, depression, hypertension hyperlipidemia who was admitted with an acute ischemic nonhemorrhagic posterior left frontal lobe infarct. CSW following for patient needs as patient has a high hospital readmission risk score. Patient is followed by [employer] in the community.     Barriers to Discharge: Continued Medical Work up   Patient Goals and CMS Choice        Expected Discharge Plan and Services   In-house Referral: Clinical Social Work     Living arrangements for the past 2 months: Single Family Home                                      Prior Living Arrangements/Services Living arrangements for the past 2 months: Single Family Home Lives with:: Adult Children Patient language and need for interpreter reviewed:: Yes        Need for Family Participation in Patient Care: Yes (Comment) Care giver support system in place?: Yes (comment)   Criminal Activity/Legal Involvement Pertinent to Current Situation/Hospitalization: No - Comment as needed  Activities of Daily Living Home Assistive Devices/Equipment: None ADL Screening (condition at time of admission) Patient's cognitive ability adequate to safely complete daily activities?: Yes Is the patient deaf or have difficulty hearing?: No Does the patient have difficulty seeing, even when wearing glasses/contacts?: No Does the patient have difficulty concentrating, remembering, or making decisions?: No Patient able to express need for assistance with ADLs?: Yes Does the patient have difficulty dressing or bathing?:  No Independently performs ADLs?: Yes (appropriate for developmental age) Does the patient have difficulty walking or climbing stairs?: Yes Weakness of Legs: Right Weakness of Arms/Hands: None  Permission Sought/Granted                  Emotional Assessment Appearance:: Appears stated age     Orientation: : Oriented to Self, Oriented to Place, Oriented to  Time, Oriented to Situation(Slowed cognition) Alcohol / Substance Use: Not Applicable Psych Involvement: No (comment)  Admission diagnosis:  Acute CVA (cerebrovascular accident) [facility]) [I63.9] Patient Active Problem List   Diagnosis Date Noted  . Acute CVA (cerebrovascular accident) ([place]) 10/12/2019  . HLD (hyperlipidemia) 10/12/2019  . CKD (chronic kidney disease) stage 3, GFR 30-59 ml/min 10/12/2019  . Acute ischemic stroke ([place]) 10/12/2019  . Chronic combined systolic and diastolic heart failure ([place])   . Depression 07/14/2019  . Bipolar disorder ([place]) 05/13/2019  . Bipolar 1 disorder ([place]) 05/13/2019  . Adjustment disorder with anxiety   . History of recent stroke 08/06/2017  . OSA (obstructive sleep apnea)   . Cryptogenic stroke ([place]) 05/20/2017  . Controlled type 2 diabetes mellitus without complication, without long-term current use of insulin ([place]) 05/08/2017  . Essential hypertension 07/27/2008  . Seizure disorder ([place]) 02/05/2007   PCP:  Horald Pollen, MD Pharmacy:   [id number] - [place], [place] [address] Phone: [phone] Fax: [phone]  Social Determinants of Health (SDOH) Interventions    Readmission Risk Interventions No flowsheet data found.

## 2019-10-12 NOTE — ED Notes (Addendum)
Patient transported to MRI 

## 2019-10-12 NOTE — Evaluation (Signed)
Physical Therapy Evaluation Patient Details Name: Brandi Gomez MRN: [id number] DOB: [birth year] Today's Date: 10/12/2019   History of Present Illness  Brandi Gomez is a 48 y.o. female with medical history significant of anemia, anxiety, bipolar disorder, depression, type 2 diabetes, CKD stage III, CVA, hypertension, hyperlipidemia, IBS, mild mental retardation, obesity (BMI 49.13), seizures, sleep apnea presenting with complaints of right leg weakness.  MRI revealed L posterior frontal lobe CVA.  Clinical Impression  Pt admitted with above diagnosis. Pt presents with very slow processing and motor planning. Required min A for bed mobility and transfers but with ambulation, had LOB to R with mild R knee buckling and needed mod A +2 to correct. Recommend intense rehab to return to PLOF.  Pt currently with functional limitations due to the deficits listed below (see PT Problem List). Pt will benefit from skilled PT to increase their independence and safety with mobility to allow discharge to the venue listed below.      Follow Up Recommendations CIR;Supervision/Assistance - 24 hour    Equipment Recommendations  Other (comment)(TBD)    Recommendations for Other Services Rehab consult     Precautions / Restrictions Precautions Precautions: Fall Restrictions Weight Bearing Restrictions: No      Mobility  Bed Mobility Overal bed mobility: Needs Assistance Bed Mobility: Rolling;Sidelying to Sit Rolling: Supervision Sidelying to sit: Min guard       General bed mobility comments: pt with use of bed rails to progress trunk to upright position;minguard for safety with mobility  Transfers Overall transfer level: Needs assistance Equipment used: Rolling walker (2 wheeled) Transfers: Sit to/from [facility] Sit to Stand: Min assist;+2 safety/equipment Stand pivot transfers: Min assist;+2 safety/equipment       General transfer comment: minA +2 for safety,  noted R knee weakness with initial standing. vc's for hand placement but pt did not heed, needed manual facilitation of hand placement  Ambulation/Gait Ambulation/Gait assistance: Mod assist;+2 physical assistance;Min assist Gait Distance (Feet): 10 Feet Assistive device: Rolling walker (2 wheeled) Gait Pattern/deviations: Step-through pattern;Decreased step length - right;Decreased weight shift to right;Decreased stance time - right Gait velocity: decreased Gait velocity interpretation: <1.31 ft/sec, indicative of household ambulator General Gait Details: pt with LOB to R with inabiliy to self correct to any extent, needed mod A +2. Pt also with decreased awareness of safety deficits and mildly impulsive. vc's for remaining close to RW.   Stairs            Wheelchair Mobility    Modified Rankin (Stroke Patients Only) Modified Rankin (Stroke Patients Only) Pre-Morbid Rankin Score: Moderate disability Modified Rankin: Moderately severe disability     Balance Overall balance assessment: Needs assistance Sitting-balance support: No upper extremity supported;Feet supported Sitting balance-Leahy Scale: Fair Sitting balance - Comments: able to sit EOB without LOB or physical assistance required for stability   Standing balance support: Bilateral upper extremity supported Standing balance-Leahy Scale: Poor Standing balance comment: needs UE support for safety                             Pertinent Vitals/Pain Pain Assessment: No/denies pain    Home Living Family/patient expects to be discharged to:: Private residence Living Arrangements: Children Available Help at Discharge: Family;Friend(s);Available 24 hours/day Type of Home: House Home Access: Stairs to enter Entrance Stairs-Rails: Psychiatric nurse of Steps: 4-5 Home Layout: One level Home Equipment: None Additional Comments: pt lives with her daughter and a  friend     Prior Function Level  of Independence: Needs assistance   Gait / Transfers Assistance Needed: independent without AD  ADL's / Homemaking Assistance Needed: pt reports she did not cook due to memory deficits;but otherwise was independent with ADL        Hand Dominance   Dominant Hand: Right    Extremity/Trunk Assessment   Upper Extremity Assessment Upper Extremity Assessment: Defer to OT evaluation RUE Deficits / Details: grossly 3-/5;R weaker than L;slower rapid alternative movements; required finger to thumb one hand at a time for increased speed, attempted both hands at the same time and demonstrated increased difficulty RUE Sensation: WNL RUE Coordination: WNL LUE Deficits / Details: grossly 3/5 LUE Sensation: WNL LUE Coordination: WNL    Lower Extremity Assessment Lower Extremity Assessment: RLE deficits/detail;LLE deficits/detail RLE Deficits / Details: knee ext 3+/5, R knee buckling occaisonally in standing, as much a neurological issue as strength. Hip flex 3/5, knee flex 4/5 RLE Sensation: WNL RLE Coordination: decreased gross motor LLE Deficits / Details: grossly 4+/5 throughout LLE Sensation: WNL LLE Coordination: WNL    Cervical / Trunk Assessment Cervical / Trunk Assessment: Normal  Communication   Communication: Expressive difficulties(slow)  Cognition Arousal/Alertness: Awake/alert Behavior During Therapy: WFL for tasks assessed/performed;Impulsive Overall Cognitive Status: No family/caregiver present to determine baseline cognitive functioning Area of Impairment: Attention;Memory;Following commands;Safety/judgement;Awareness;Problem solving                   Current Attention Level: Sustained Memory: Decreased recall of precautions Following Commands: Follows one step commands with increased time;Follows multi-step commands inconsistently Safety/Judgement: Decreased awareness of safety;Decreased awareness of deficits Awareness: Emergent Problem Solving: Slow  processing;Difficulty sequencing;Requires tactile cues;Requires verbal cues General Comments: pt demonstrated difficulty with motor planning, decreased safety awareness;had 1 LOB and did not appear to have a change in affect with it, continued to require multimodal cues for proper use of RW;slow processing;further assessment of cognition warranted      General Comments General comments (skin integrity, edema, etc.): VSS throughout;session limited secondary to arrival of transport to bring pt to MRI    Exercises     Assessment/Plan    PT Assessment Patient needs continued PT services  PT Problem List Decreased cognition;Decreased coordination;Decreased mobility;Decreased balance;Decreased activity tolerance;Decreased strength;Decreased knowledge of use of DME;Decreased safety awareness;Decreased knowledge of precautions;Obesity       PT Treatment Interventions DME instruction;Gait training;Functional mobility training;Therapeutic activities;Stair training;Therapeutic exercise;Balance training;Neuromuscular re-education;Cognitive remediation;Patient/family education    PT Goals (Current goals can be found in the Care Plan section)  Acute Rehab PT Goals Patient Stated Goal: to go home PT Goal Formulation: With patient Time For Goal Achievement: 10/26/19 Potential to Achieve Goals: Good    Frequency Min 4X/week   Barriers to discharge        Co-evaluation PT/OT/SLP Co-Evaluation/Treatment: Yes Reason for Co-Treatment: Complexity of the patient's impairments (multi-system involvement);Necessary to address cognition/behavior during functional activity;For patient/therapist safety PT goals addressed during session: Mobility/safety with mobility;Balance;Proper use of DME OT goals addressed during session: ADL's and self-care       AM-PAC PT "6 Clicks" Mobility  Outcome Measure Help needed turning from your back to your side while in a flat bed without using bedrails?: A Little Help  needed moving from lying on your back to sitting on the side of a flat bed without using bedrails?: A Little Help needed moving to and from a bed to a chair (including a wheelchair)?: A Little Help needed standing up from a chair  using your arms (e.g., wheelchair or bedside chair)?: A Little Help needed to walk in hospital room?: A Lot Help needed climbing 3-5 steps with a railing? : Total 6 Click Score: 15    End of Session Equipment Utilized During Treatment: Gait belt Activity Tolerance: Patient tolerated treatment well Patient left: in bed;with call bell/phone within reach Nurse Communication: Mobility status PT Visit Diagnosis: Unsteadiness on feet (R26.81);History of falling (Z91.81);Ataxic gait (R26.0);Difficulty in walking, not elsewhere classified (R26.2)    Time: [id number] PT Time Calculation (min) (ACUTE ONLY): 23 min   Charges:   PT Evaluation $PT Eval Moderate Complexity: [place]  Pager [phone] Office [place] 10/12/2019, 12:54 PM

## 2019-10-12 NOTE — Consult Note (Signed)
Referring Physician: Dr. Doristine Bosworth    Chief Complaint: Right lower extremity weakness  HPI: Brandi Gomez is an 48 y.o. female with prior history of stroke manifesting as left sided weakness, DM2, HTN, morbid obesity, partial complex seizures and sleep apnea who presented to the ED via EMS from her home late Monday night with c/c of fall due to RLE weakness. Symptoms began 6 hours prior to presentation. CBG was 120 per EMS, with VSS. The patient continued to have weakness of her RLE on arrival, also endorsing a numb sensation down her right leg from hip to foot. She was out of the tPA time window on arrival.  MRI L-spine was unremarkable.   MRI brain revealed an acute 12 mm left ACA territory ischemic infarction.    Past Medical History:  Diagnosis Date  . Anemia   . Anxiety   . Bipolar 1 disorder ([place])   . Common migraine 05/19/2015  . Depression   . Diabetes mellitus, type II ([place])   . Hypertension   . Irritable bowel syndrome (IBS)   . Mild mental retardation   . Obesity   . Partial complex seizure disorder with intractable epilepsy ([place]) 05/12/2014  . Seizures ([place])    intractable, sz 08/23/17  . Sleep apnea   . Stroke ([place])   . Type II or unspecified type diabetes mellitus without mention of complication, not stated as uncontrolled     Past Surgical History:  Procedure Laterality Date  . COLONOSCOPY     2012-normal , Dr Sharlett Iles  . ESOPHAGOGASTRODUODENOSCOPY     normal-Dr Patterson 2012  . LOOP RECORDER INSERTION N/A 05/30/2017   Procedure: Loop Recorder Insertion;  Surgeon: Constance Haw, MD;  Location: [facility];  Service: Cardiovascular;  Laterality: N/A;  . LOOP RECORDER REMOVAL N/A 03/04/2018   Procedure: LOOP RECORDER REMOVAL;  Surgeon: Constance Haw, MD;  Location: [facility];  Service: Cardiovascular;  Laterality: N/A;  . MYRINGOTOMY WITH TUBE PLACEMENT    . MYRINGOTOMY WITH TUBE PLACEMENT Right 11/05/2017   Procedure: MYRINGOTOMY  WITH TUBE PLACEMENT;  Surgeon: Melissa Montane, MD;  Location: [place] [facility];  Service: ENT;  Laterality: Right;  right T Tube placement  . NASAL SINUS SURGERY    . TEE WITHOUT CARDIOVERSION N/A 05/30/2017   Procedure: TRANSESOPHAGEAL ECHOCARDIOGRAM (TEE);  Surgeon: Acie Fredrickson Wonda Cheng, MD;  [facility];  Service: Cardiovascular;  Laterality: N/A;    Family History  Problem Relation Age of Onset  . Diabetes Mother        passed away from accidental death  . Hypertension Mother   . Bipolar disorder Father   . Diabetes Daughter   . Leukemia Daughter   . Ovarian cancer Maternal Aunt    Social History:  reports that she has never smoked. She has never used smokeless tobacco. She reports that she does not drink alcohol or use drugs.  Allergies:  Allergies  Allergen Reactions  . Amoxicillin Itching    Has patient had a PCN reaction causing immediate rash, facial/tongue/throat swelling, SOB or lightheadedness with hypotension: yes Has patient had a PCN reaction causing severe rash involving mucus membranes or skin necrosis: no Has patient had a PCN reaction that required hospitalization: no Has patient had a PCN reaction occurring within the last 10 years: yes If all of the above answers are "NO", then may proceed with Cephalosporin use.  Marland Kitchen Lisinopril Swelling    Angioedema   . Hydrocodone Other (See Comments)  Depressed   . Tegretol [Carbamazepine] Swelling    Throat swells    Medications:  No current facility-administered medications on file prior to encounter.    Current Outpatient Medications on File Prior to Encounter  Medication Sig Dispense Refill  . ARIPiprazole (ABILIFY) 10 MG tablet Take 10 mg by mouth daily.    Marland Kitchen atorvastatin (LIPITOR) 10 MG tablet Take 1 tablet (10 mg total) by mouth daily at 6 PM. 90 tablet 3  . carvedilol (COREG) 3.125 MG tablet TAKE 1 TABLET (3.125 MG TOTAL) BY MOUTH DAILY. 30 tablet 1  . lamoTRIgine (LAMICTAL) 25 MG tablet TAKE 3 TABLETS TWICE DAILY  (Patient taking differently: Take 75 mg by mouth 2 (two) times daily. ) 540 tablet 0  . sitaGLIPtin (JANUVIA) 50 MG tablet Take 1 tablet by mouth daily. 90 tablet 1  . torsemide (DEMADEX) 10 MG tablet Take 10 mg by mouth daily.    . Vitamin D, Ergocalciferol, (DRISDOL) 1.25 MG (50000 UT) CAPS capsule TAKE 1 CAPSULE (50,000 UNITS TOTAL) BY MOUTH EVERY 7 (SEVEN) DAYS. (Patient taking differently: Take 50,000 Units by mouth every Tuesday. ) 5 capsule 0  . ARIPiprazole (ABILIFY) 5 MG tablet Take 1 tablet (5 mg total) by mouth daily. (Patient not taking: Reported on 10/12/2019) 30 tablet 3  . Blood Glucose Monitoring Suppl KIT Please supply kit covered by insurance and supplies for testing four times daily. E11.9 E10.9 1 each 0  . Blood Pressure Monitor KIT Take blood pressure reading twice a day for the next 2 weeks and then once a day. 1 each 0  . triamcinolone (KENALOG) 0.1 % paste Use as directed 1 application in the mouth or throat 2 (two) times daily. (Patient not taking: Reported on 08/23/2019) 5 g 0     ROS: As per HPI. Comprehensive ROS otherwise negative.   Physical Examination: Blood pressure 118/81, pulse 64, temperature 98.8 F (37.1 C), temperature source Oral, resp. rate 16, height _0  (1.549 m), weight 117.9 kg, SpO2 100 %.  HEENT: /AT Lungs: Respirations unlabored Ext: No edema  Neurologic Examination: Mental Status: Alert, fully oriented, thought content appropriate.  Speech fluent with intact comprehension and naming. Cranial Nerves: II:  Visual fields intact with no extinction to DSS. PERRL.  III,IV, VI: No ptosis. EOMI. No nystagmus.  V,VII: No facial droop. Temp sensation equal bilaterally.  VIII: hearing intact to voice IX,X: No hypophonia XI: Symmetric XII: midline tongue extension  Motor: RUE with 4/5 deltoid strength, otherwise 5/5 RLE 4/5 proximally. ADF/APF is 5/5 LUE and LLE 5/5 Sensory: Temp sensation intact x 4 Deep Tendon Reflexes:  Normoactive x  4 Cerebellar: No ataxia with FNF bilaterally  Gait: Deferred   Results for orders placed or performed during the hospital encounter of 10/11/19 (from the past 48 hour(s))  Comprehensive metabolic panel     Status: Abnormal   Collection Time: 10/11/19 11:45 PM  Result Value Ref Range   Sodium 138 135 - 145 mmol/L   Potassium 4.2 3.5 - 5.1 mmol/L   Chloride 104 98 - 111 mmol/L   CO2 26 22 - 32 mmol/L   Glucose, Bld 122 (H) 70 - 99 mg/dL   BUN 21 (H) 6 - 20 mg/dL   Creatinine, Ser 1.52 (H) 0.44 - 1.00 mg/dL   Calcium 8.5 (L) 8.9 - 10.3 mg/dL   Total Protein 6.9 6.5 - 8.1 g/dL   Albumin 3.3 (L) 3.5 - 5.0 g/dL   AST 17 15 - 41 U/L   ALT  12 0 - 44 U/L   Alkaline Phosphatase 75 38 - 126 U/L   Total Bilirubin 0.8 0.3 - 1.2 mg/dL   GFR calc non Af Amer 40 (L) >60 mL/min   GFR calc Af Amer 46 (L) >60 mL/min   Anion gap 8 5 - 15    Comment: Performed at [facility] [address], [place], [place] [address]  CBC with Differential     Status: Abnormal   Collection Time: 10/11/19 11:45 PM  Result Value Ref Range   WBC 8.2 4.0 - 10.5 K/uL   RBC 3.61 (L) 3.87 - 5.11 MIL/uL   Hemoglobin 10.4 (L) 12.0 - 15.0 g/dL   HCT 33.2 (L) 36.0 - 46.0 %   MCV 92.0 80.0 - 100.0 fL   MCH 28.8 26.0 - 34.0 pg   MCHC 31.3 30.0 - 36.0 g/dL   RDW 14.6 11.5 - 15.5 %   Platelets 350 150 - 400 K/uL   nRBC 0.0 0.0 - 0.2 %   Neutrophils Relative % 59 %   Neutro Abs 4.8 1.7 - 7.7 K/uL   Lymphocytes Relative 33 %   Lymphs Abs 2.7 0.7 - 4.0 K/uL   Monocytes Relative 6 %   Monocytes Absolute 0.5 0.1 - 1.0 K/uL   Eosinophils Relative 2 %   Eosinophils Absolute 0.2 0.0 - 0.5 K/uL   Basophils Relative 0 %   Basophils Absolute 0.0 0.0 - 0.1 K/uL   Immature Granulocytes 0 %   Abs Immature Granulocytes 0.03 0.00 - 0.07 K/uL    Comment: Performed at [facility] [address] [place], [place] [address]   Mr Brain 90 Contrast  Result Date: 10/12/2019 CLINICAL DATA:  Initial evaluation for acute  right lower extremity weakness, fall. EXAM: MRI HEAD WITHOUT CONTRAST TECHNIQUE: Multiplanar, multiecho pulse sequences of the brain and surrounding structures were obtained without intravenous contrast. COMPARISON:  Prior head CT from 06/30/2019. FINDINGS: Brain: Examination mildly degraded by motion artifact. Cerebral volume within normal limits for age. Patchy T2/FLAIR hyperintensity within the periventricular and deep white matter both cerebral hemispheres noted, nonspecific, but most like related chronic microvascular ischemic disease, mild in nature. Patchy focus of restricted diffusion measuring approximately 12 mm seen involving the cortical and subcortical aspect of the parasagittal posterior left frontal lobe, left ACA distribution (series 9, images 81, 83). No associated hemorrhage or mass effect. No other evidence for acute or subacute ischemia. Gray-white matter differentiation otherwise maintained. No other areas of remote cortical infarction. No foci of susceptibility artifact to suggest acute or chronic intracranial hemorrhage. No mass lesion, midline shift or mass effect. No hydrocephalus. No extra-axial fluid collection. Pituitary gland suprasellar region normal. Midline structures intact. Vascular: Major intracranial vascular flow voids are maintained. Skull and upper cervical spine: Craniocervical junction normal. Upper cervical spine within normal limits. Diffusely decreased T1 signal intensity seen throughout the visualized bone marrow, nonspecific, but most commonly related to anemia, smoking, or obesity. No discrete osseous lesions. Scalp soft tissues within normal limits. Sinuses/Orbits: Globes and orbital soft tissues within normal limits. Paranasal sinuses are clear. Small bilateral mastoid effusions noted, of doubtful significance. Visualized nasopharynx within normal limits. Inner ear structures grossly normal. Other: None. IMPRESSION: 1. 12 mm acute ischemic nonhemorrhagic posterior  left frontal lobe infarct, left ACA distribution. No associated hemorrhage or mass effect. 2. Underlying mild chronic microvascular ischemic disease. Electronically Signed   By: Jeannine Boga M.D.   On: 10/12/2019 01:13   Mr Lumbar Spine  Wo Contrast  Result Date: 10/12/2019 CLINICAL DATA:  Initial evaluation for acute right lower extremity weakness. EXAM: MRI LUMBAR SPINE WITHOUT CONTRAST TECHNIQUE: Multiplanar, multisequence MR imaging of the lumbar spine was performed. No intravenous contrast was administered. COMPARISON:  None. FINDINGS: Segmentation: Standard. Lowest well-formed disc space labeled the L5-S1 level. Alignment: Physiologic with preservation of the normal lumbar lordosis. No listhesis. Vertebrae: Vertebral body height maintained without evidence for acute or chronic fracture. Bone marrow signal intensity diffusely decreased on T1 weighted imaging, nonspecific, but most commonly related to anemia, smoking, or obesity. Few scattered benign hemangiomata noted. No other discrete or worrisome osseous lesions. No abnormal marrow edema. Conus medullaris and cauda equina: Conus extends to the L1-2 level. Conus and cauda equina appear normal. Paraspinal and other soft tissues: Paraspinous soft tissues within normal limits. Visualized visceral structures are unremarkable. Disc levels: L1-2:  Unremarkable. L2-3:  Unremarkable. L3-4: Normal interspace. Mild bilateral facet hypertrophy. No significant stenosis. L4-5: Minimal annular disc bulge with disc desiccation. Moderate bilateral facet hypertrophy. Associated trace joint effusion on the left. No significant spinal stenosis. Mild right L4 foraminal narrowing. L5-S1: Minimal annular disc bulge with disc desiccation. Moderate left greater than right facet hypertrophy. Associated small bilateral joint effusions. Epidural lipomatosis. No significant spinal stenosis. Mild left L5 foraminal narrowing. IMPRESSION: 1. No acute abnormality within the  lumbar spine. No significant spinal stenosis or evidence for neural impingement. 2. Moderate facet arthropathy at L4-5 and L5-S1 with resultant mild right L4 and left L5 foraminal stenosis. 3. Diffusely decreased T1 marrow signal intensity, nonspecific, but most commonly related to anemia, smoking, or obesity. Electronically Signed   By: Jeannine Boga M.D.   On: 10/12/2019 01:19    Assessment: 48 y.o. female with new onset of RLE weakness. MRI reveals an acute 12 mm left posterior ACA territory ischemic infarction.  1. Exam reveals right deltoid and proximal RLE weakness 2. Stroke Risk Factors - Morbid obesity, HTN, DM2, sleep apnea and prior stroke 3. History of partial complex seizures. On Lamictal 75 mg BID at home.   Plan: 1. HgbA1c, fasting lipid panel 2. MRA of the brain without contrast 3. PT consult, OT consult, Speech consult 4. Echocardiogram 5. Carotid dopplers 6. Prophylactic therapy- Start ASA 81 mg po qd. Increase atorvastatin to 40 mg po qd. Obtain CK level.  7. Risk factor modification 8. Telemetry monitoring 9. Frequent neuro checks 10. BP management   _0  signed: Dr. Kerney Elbe  10/12/2019, 2:05 AM

## 2019-10-12 NOTE — Progress Notes (Signed)
Rehab Admissions Coordinator Note:  Per PT and OT recommendation, this patient was screened by Raechel Ache for appropriateness for an Inpatient Acute Rehab Consult.  At this time, we are recommending Inpatient Rehab consult. AC will contact MD to request order  Raechel Ache 10/12/2019, 2:04 PM  I can be reached at [phone].

## 2019-10-12 NOTE — Consult Note (Signed)
Cardiology Consultation:   Patient ID: CHANCEY CULLINANE MRN: [id number]; DOB: [birth year]  Admit date: 10/11/2019 Date of Consult: 10/12/2019  Primary Care Provider: Horald Pollen, MD Primary Cardiologist: No primary care provider on file.  Primary Electrophysiologist:  None    Patient Profile:   Brandi Gomez is a 48 y.o. female with a hx of CVA 2018, HTN, HLD, CKD stage III, epilepsy, mild mental retardation, OSA on CPAP, bipolar I, and depression who is being seen today for the evaluation of heart failure at the request of Dr. Doristine Bosworth.  History of Present Illness:   Brandi Gomez does not regularly follow with cardiology. In 2018 the patient had a cryptogenic stroke. Echo TEE during admission showed EF 40-45% with mild LVH. She was seen by Dr. Curt Bears for the placement of a Linq monitor which was placed on 05/30/2017. Nothing was noted on the monitor. She was seen on 02/25/18 and requested the monitor be removed. It was removed on 03/04/2018.   The patient presented to the ER on 10/11/19 for right leg weakness. She was walking through a park when she felt extreme right leg weakness causing her to fall. She caught herself with her hands. She didn't hit her head or loose consciousness. No right arm weakness or trouble speaking. She was able to get up but was limping. Her friend called EMS who brought the patient to the hospital.  In the ED the patient continued to have right leg weakness on arrival. Labs revealed sodium 138, potassium 4.2, glucose 122, creatinine 1.52, albumin 3.3. WBC 8.2, Hgb 10.4. MRI lumbar was unremarkable. MRI brain showed an acute left ACA ischemic infarction. Neurology was consulted and the patient was admitted. Echo showed reduced EF and cardiology was consulted.  Patient says her right leg weakness is improving. Strength is improving. Sensation is equal on both sides. Denies tobacco/alcohol/drug abuse. The patient lives with a friend. She is on disability  for mental health dx. She uses CPAP every night. Denies chest pain or shortness of breath at rest or on exertion. Denies lower leg edema.   Heart Pathway Score:     Past Medical History:  Diagnosis Date   Anemia    Anxiety    Bipolar 1 disorder ([place])    Common migraine 05/19/2015   Depression    Diabetes mellitus, type II ([place])    Hypertension    Irritable bowel syndrome (IBS)    Mild mental retardation    Obesity    Partial complex seizure disorder with intractable epilepsy ([place]) 05/12/2014   Seizures ([place])    intractable, sz 08/23/17   Sleep apnea    Stroke ([place])    Type II or unspecified type diabetes mellitus without mention of complication, not stated as uncontrolled     Past Surgical History:  Procedure Laterality Date   COLONOSCOPY     2012-normal , Dr Sharlett Iles   ESOPHAGOGASTRODUODENOSCOPY     normal-Dr Patterson 2012   LOOP RECORDER INSERTION N/A 05/30/2017   Procedure: Loop Recorder Insertion;  Surgeon: Constance Haw, MD;  Location: [facility];  Service: Cardiovascular;  Laterality: N/A;   LOOP RECORDER REMOVAL N/A 03/04/2018   Procedure: LOOP RECORDER REMOVAL;  Surgeon: Constance Haw, MD;  Location: [facility];  Service: Cardiovascular;  Laterality: N/A;   MYRINGOTOMY WITH TUBE PLACEMENT     MYRINGOTOMY WITH TUBE PLACEMENT Right 11/05/2017   Procedure: MYRINGOTOMY WITH TUBE PLACEMENT;  Surgeon: Melissa Montane, MD;  Location: [facility];  Service: ENT;  Laterality: Right;  right T Tube placement   NASAL SINUS SURGERY     TEE WITHOUT CARDIOVERSION N/A 05/30/2017   Procedure: TRANSESOPHAGEAL ECHOCARDIOGRAM (TEE);  Surgeon: Acie Fredrickson Wonda Cheng, MD;  Location: [facility] ENDOSCOPY;  Service: Cardiovascular;  Laterality: N/A;     Home Medications:  Prior to Admission medications   Medication Sig Start Date End Date Taking? Authorizing Provider  ARIPiprazole (ABILIFY) 10 MG tablet Take 10 mg by mouth daily. 09/18/19  Yes [provider]  atorvastatin (LIPITOR) 10 MG tablet Take 1 tablet (10 mg total) by mouth daily at 6 PM. 05/12/19 10/11/28 Yes Sagardia, Ines Bloomer, MD  carvedilol (COREG) 3.125 MG tablet TAKE 1 TABLET (3.125 MG TOTAL) BY MOUTH DAILY. 05/18/19  Yes Sagardia, Ines Bloomer, MD  lamoTRIgine (LAMICTAL) 25 MG tablet TAKE 3 TABLETS TWICE DAILY Patient taking differently: Take 75 mg by mouth 2 (two) times daily.  09/20/19  Yes [name], NP  sitaGLIPtin (JANUVIA) 50 MG tablet Take 1 tablet by mouth daily. 08/11/19  Yes Sagardia, Ines Bloomer, MD  torsemide (DEMADEX) 10 MG tablet Take 10 mg by mouth daily. 10/03/19  Yes [provider]  Vitamin D, Ergocalciferol, (DRISDOL) 1.25 MG (50000 UT) CAPS capsule TAKE 1 CAPSULE (50,000 UNITS TOTAL) BY MOUTH EVERY 7 (SEVEN) DAYS. Patient taking differently: Take 50,000 Units by mouth every Tuesday.  09/22/19  Yes Sagardia, Ines Bloomer, MD  ARIPiprazole (ABILIFY) 5 MG tablet Take 1 tablet (5 mg total) by mouth daily. Patient not taking: Reported on 10/12/2019 09/08/19   Norma Fredrickson, MD  Blood Glucose Monitoring Suppl KIT Please supply kit covered by insurance and supplies for testing four times daily. E11.9 E10.9 05/12/19   Horald Pollen, MD  Blood Pressure Monitor KIT Take blood pressure reading twice a day for the next 2 weeks and then once a day. 05/12/19   Horald Pollen, MD  triamcinolone (KENALOG) 0.1 % paste Use as directed 1 application in the mouth or throat 2 (two) times daily. Patient not taking: Reported on 08/23/2019 07/26/19   Zigmund Gottron, NP    Inpatient Medications: Scheduled Meds:  ARIPiprazole  10 mg Oral Daily   [START ON 10/13/2019] aspirin EC  81 mg Oral Daily   atorvastatin  40 mg Oral q1800   clopidogrel  75 mg Oral Daily   enoxaparin (LOVENOX) injection  40 mg Subcutaneous Q24H   insulin aspart  0-5 Units Subcutaneous QHS   insulin aspart  0-9 Units Subcutaneous TID WC   lamoTRIgine  75 mg Oral BID   Vitamin D  (Ergocalciferol)  50,000 Units Oral Q Tue   Continuous Infusions:  PRN Meds: acetaminophen **OR** acetaminophen (TYLENOL) oral liquid 160 mg/5 mL **OR** acetaminophen, senna-docusate  Allergies:    Allergies  Allergen Reactions   Amoxicillin Itching    Has patient had a PCN reaction causing immediate rash, facial/tongue/throat swelling, SOB or lightheadedness with hypotension: yes Has patient had a PCN reaction causing severe rash involving mucus membranes or skin necrosis: no Has patient had a PCN reaction that required hospitalization: no Has patient had a PCN reaction occurring within the last 10 years: yes If all of the above answers are "NO", then may proceed with Cephalosporin use.   Lisinopril Swelling    Angioedema    Hydrocodone Other (See Comments)    Depressed    Tegretol [Carbamazepine] Swelling    Throat swells    Social History:   Social History   Socioeconomic History  Marital status: Single    Spouse name: [name]   Number of children: 3   Years of education: 12   Highest education level: Not on file  Occupational History   Occupation: disabled    Fish farm manager: DISABLED  Social Designer, fashion/clothing strain: Not on file   Food insecurity    Worry: Never true    Inability: Never true   Transportation needs    Medical: No    Non-medical: No  Tobacco Use   Smoking status: Never Smoker   Smokeless tobacco: Never Used  Substance and Sexual Activity   Alcohol use: No   Drug use: No   Sexual activity: Never  Lifestyle   Physical activity    Days per week: Not on file    Minutes per session: Not on file   Stress: Not on file  Relationships   Social connections    Talks on phone: Not on file    Gets together: Not on file    Attends religious service: Not on file    Active member of club or organization: Not on file    Attends meetings of clubs or organizations: Not on file    Relationship status: Not on file   Intimate  partner violence    Fear of current or ex partner: Not on file    Emotionally abused: Not on file    Physically abused: Not on file    Forced sexual activity: Not on file  Other Topics Concern   Not on file  Social History Narrative   Patient lives at home with daughter.    Patient has 3 children.    Patient is right handed.    Patient has a high school education.    Patient is on disability   Patient drinks 2 cups of caffeine daily.    Family History:   Family History  Problem Relation Age of Onset   Diabetes Mother        passed away from accidental death   Hypertension Mother    Bipolar disorder Father    Diabetes Daughter    Leukemia Daughter    Ovarian cancer Maternal Aunt      ROS:  Please see the history of present illness.  All other ROS reviewed and negative.     Physical Exam/Data:   Vitals:   10/12/19 0740 10/12/19 0940 10/12/19 1055 10/12/19 1140  BP: 111/61 (!) 107/54  127/78  Pulse:   61   Resp:  18  20  Temp: 98.1 F (36.7 C) 98.6 F (37 C) 98.6 F (37 C) 98.9 F (37.2 C)  TempSrc: Oral Oral Oral Oral  SpO2: 100% 100% 99% 100%  Weight:      Height:        Intake/Output Summary (Last 24 hours) at 10/12/2019 1500 Last data filed at 10/12/2019 0333 Gross per 24 hour  Intake --  Output 1350 ml  Net -1350 ml   Last 3 Weights 10/11/2019 08/28/2019 08/09/2019  Weight (lbs) 260 lb 275 lb 9.2 oz 274 lb 6.4 oz  Weight (kg) 117.935 kg 125 kg 124.467 kg  Some encounter information is confidential and restricted. Go to Review Flowsheets activity to see all data.     Body mass index is 49.13 kg/m.  General:  Obese AAF in no acute distress HEENT: normal Lymph: no adenopathy Neck: no JVD Endocrine:  No thryomegaly Vascular: No carotid bruits; FA pulses 2+ bilaterally without bruits  Cardiac:  normal S1, S2; RRR;  no murmur  Lungs:  clear to auscultation bilaterally, no wheezing, rhonchi or rales  Abd: soft, nontender, no hepatomegaly  Ext: no  edema Musculoskeletal:  No deformities, BUE and BLE strength normal and equal Skin: warm and dry  Neuro:  CNs 2-12 intact, no focal abnormalities noted Psych:  Mild mental retardation  EKG:  The EKG was personally reviewed and demonstrates:   Telemetry:  Telemetry was personally reviewed and demonstrates:  NSR, HR have been labile; was in the 50s, now slowly climbing tot he 70s, does have sinus tachy peak rate 141; first degree AV block PRI 0.23s  Relevant CV Studies:  Echo 10/12/19 1. Left ventricular ejection fraction, by visual estimation, is 35 to 40%. The left ventricle has moderately decreased function. There is mildly increased left ventricular hypertrophy.  2. Left ventricular diastolic parameters are consistent with Grade II diastolic dysfunction (pseudonormalization).  3. Global right ventricle has normal systolic function.The right ventricular size is normal.  4. Left atrial size was normal.  5. Right atrial size was normal.  6. The mitral valve is normal in structure. No evidence of mitral valve regurgitation. No evidence of mitral stenosis.  7. The tricuspid valve is normal in structure. Tricuspid valve regurgitation is trivial.  8. The aortic valve is normal in structure. Aortic valve regurgitation is not visualized. No evidence of aortic valve sclerosis or stenosis.  9. The pulmonic valve was not well visualized. Pulmonic valve regurgitation is not visualized. 10. Technically difficult; moderate global reduction in LV systolic function; grade 2 diastolic dysfunction; mild LVH.  Laboratory Data:  High Sensitivity Troponin:  No results for input(s): TROPONINIHS in the last 720 hours.   Chemistry Recent Labs  Lab 10/11/19 2345  NA 138  K 4.2  CL 104  CO2 26  GLUCOSE 122*  BUN 21*  CREATININE 1.52*  CALCIUM 8.5*  GFRNONAA 40*  GFRAA 46*  ANIONGAP 8    Recent Labs  Lab 10/11/19 2345  PROT 6.9  ALBUMIN 3.3*  AST 17  ALT 12  ALKPHOS 75  BILITOT 0.8    Hematology Recent Labs  Lab 10/11/19 2345  WBC 8.2  RBC 3.61*  HGB 10.4*  HCT 33.2*  MCV 92.0  MCH 28.8  MCHC 31.3  RDW 14.6  PLT 350   BNPNo results for input(s): BNP, PROBNP in the last 168 hours.  DDimer No results for input(s): DDIMER in the last 168 hours.   Radiology/Studies:  Mr Angio Head Wo Contrast  Result Date: 10/12/2019 CLINICAL DATA:  Stroke, follow-up. EXAM: MRA HEAD WITHOUT CONTRAST TECHNIQUE: Angiographic images of the Circle of Willis were obtained using MRA technique without intravenous contrast. COMPARISON:  Brain MRI 10/12/2019, MRA head 05/21/2017 FINDINGS: The intracranial internal carotid arteries are patent without significant stenosis. The right middle cerebral artery is patent without significant proximal stenosis. The right anterior cerebral artery is patent. Apparent mild focal stenosis within the non dominant right A1 segment (series 252, image 3). The left middle and anterior cerebral arteries are patent without significant proximal stenosis. No intracranial aneurysm is identified. The intracranial vertebral arteries are patent without significant stenosis, as is the basilar artery. The bilateral posterior cerebral arteries are patent without significant proximal stenosis. IMPRESSION: No intracranial large vessel occlusion or proximal high-grade arterial stenosis. Mild focal stenosis within the non dominant A1 right anterior cerebral artery. Electronically Signed   By: Kellie Simmering DO   On: 10/12/2019 12:31   [name]  Result Date: 10/12/2019 CLINICAL DATA:  Initial evaluation for acute right lower extremity weakness, fall. EXAM: MRI HEAD WITHOUT CONTRAST TECHNIQUE: Multiplanar, multiecho pulse sequences of the brain and surrounding structures were obtained without intravenous contrast. COMPARISON:  Prior head CT from 06/30/2019. FINDINGS: Brain: Examination mildly degraded by motion artifact. Cerebral volume within normal limits for age. Patchy  T2/FLAIR hyperintensity within the periventricular and deep white matter both cerebral hemispheres noted, nonspecific, but most like related chronic microvascular ischemic disease, mild in nature. Patchy focus of restricted diffusion measuring approximately 12 mm seen involving the cortical and subcortical aspect of the parasagittal posterior left frontal lobe, left ACA distribution (series 9, images 81, 83). No associated hemorrhage or mass effect. No other evidence for acute or subacute ischemia. Gray-white matter differentiation otherwise maintained. No other areas of remote cortical infarction. No foci of susceptibility artifact to suggest acute or chronic intracranial hemorrhage. No mass lesion, midline shift or mass effect. No hydrocephalus. No extra-axial fluid collection. Pituitary gland suprasellar region normal. Midline structures intact. Vascular: Major intracranial vascular flow voids are maintained. Skull and upper cervical spine: Craniocervical junction normal. Upper cervical spine within normal limits. Diffusely decreased T1 signal intensity seen throughout the visualized bone marrow, nonspecific, but most commonly related to anemia, smoking, or obesity. No discrete osseous lesions. Scalp soft tissues within normal limits. Sinuses/Orbits: Globes and orbital soft tissues within normal limits. Paranasal sinuses are clear. Small bilateral mastoid effusions noted, of doubtful significance. Visualized nasopharynx within normal limits. Inner ear structures grossly normal. Other: None. IMPRESSION: 1. 12 mm acute ischemic nonhemorrhagic posterior left frontal lobe infarct, left ACA distribution. No associated hemorrhage or mass effect. 2. Underlying mild chronic microvascular ischemic disease. Electronically Signed   By: Jeannine Boga M.D.   On: 10/12/2019 01:13   [name]  Result Date: 10/12/2019 CLINICAL DATA:  Initial evaluation for acute right lower extremity weakness. EXAM:  MRI LUMBAR SPINE WITHOUT CONTRAST TECHNIQUE: Multiplanar, multisequence MR imaging of the lumbar spine was performed. No intravenous contrast was administered. COMPARISON:  None. FINDINGS: Segmentation: Standard. Lowest well-formed disc space labeled the L5-S1 level. Alignment: Physiologic with preservation of the normal lumbar lordosis. No listhesis. Vertebrae: Vertebral body height maintained without evidence for acute or chronic fracture. Bone marrow signal intensity diffusely decreased on T1 weighted imaging, nonspecific, but most commonly related to anemia, smoking, or obesity. Few scattered benign hemangiomata noted. No other discrete or worrisome osseous lesions. No abnormal marrow edema. Conus medullaris and cauda equina: Conus extends to the L1-2 level. Conus and cauda equina appear normal. Paraspinal and other soft tissues: Paraspinous soft tissues within normal limits. Visualized visceral structures are unremarkable. Disc levels: L1-2:  Unremarkable. L2-3:  Unremarkable. L3-4: Normal interspace. Mild bilateral facet hypertrophy. No significant stenosis. L4-5: Minimal annular disc bulge with disc desiccation. Moderate bilateral facet hypertrophy. Associated trace joint effusion on the left. No significant spinal stenosis. Mild right L4 foraminal narrowing. L5-S1: Minimal annular disc bulge with disc desiccation. Moderate left greater than right facet hypertrophy. Associated small bilateral joint effusions. Epidural lipomatosis. No significant spinal stenosis. Mild left L5 foraminal narrowing. IMPRESSION: 1. No acute abnormality within the lumbar spine. No significant spinal stenosis or evidence for neural impingement. 2. Moderate facet arthropathy at L4-5 and L5-S1 with resultant mild right L4 and left L5 foraminal stenosis. 3. Diffusely decreased T1 marrow signal intensity, nonspecific, but most commonly related to anemia, smoking, or obesity. Electronically Signed   By: Jeannine Boga M.D.   On:  10/12/2019 01:19    Assessment and Plan:  Acute ischemic stroke/H/o stroke 2018 Patient had stroke in 2018 presenting with left upper extremity numbness. TEE was negative for PFO. Loop recorder had no evidence of afib. EF at that time was 40-45% with mild LVH. Was not put on antiplatelet therapy - Now presents with new symptoms of right leg weakness. MRI head showed acute ischemic stroke; was outside of TPA window. Neurology feel it is an embolic pattern - MRA unremarkable - A1C 6.5, LDL 64 - Echo showed reduced EF - No evidence of afib on telemetry - Started on Aspirin and plavix - permissive hypertension - per neurology  Cardiomyopathy with new reduced EF 35-40% and G2DD - Echo in 2018 showed EF 40-45%. Was on coreg 3.125 mg at baseline - Echo this admission with reduced EF 35-40% and G2DD - EKG with no ischemic changes - Patient denies chest pain or shortness of breath - She does not appear to be fluid overloaded at this time.  - Patient will need an ischemic evaluation at some point, likely as outpatient. Creatinine is 1.52 (baseline around 1.2). Given size would favor cardiac cath over cardiac CT once creatinine is improved.  - Coreg currently on hold>> would restart once cleared by neurology  HTN - home meds coreg 3.125 mg daily>> held on admission for permissive hypertension - Pressures reasonable - would restart low does BB per neurology  HLD - home med atorvastatin 10 mg>>was changed to 40 mg - LDL 46, goal <70  DM2 - A1C 6.5  CKD stage III - baseline around 1.2 - creatinine on admission was 1.52 - daily bmet   For questions or updates, please contact [facility] Please consult [email or web address] for contact info under     Signed, Mercy Malena Ninfa Meeker, PA-C  10/12/2019 3:00 PM

## 2019-10-12 NOTE — ED Notes (Signed)
Attempted to call report, requested RN read report and call w/ questions.

## 2019-10-12 NOTE — ED Notes (Signed)
Lab drawling blood

## 2019-10-12 NOTE — Progress Notes (Signed)
STROKE TEAM PROGRESS NOTE   INTERVAL HISTORY RN at bedside. Pt lying in bed, stated that she had right leg weakness and gave out on her yesterday. Now is much improved. She had stroke in 05/2017 and record showed she had left sided weakness and right frontal cortical stroke on MRI. However, she stated that what she remembered that she also had right side weakness back then and always has been slightly weaker on the right comparing with left. She said her left side always normal.   Vitals:   10/12/19 0540 10/12/19 0548 10/12/19 0740 10/12/19 0940  BP:  117/61 111/61 (!) 107/54  Pulse: 61 (!) 56    Resp: 15 15  18   Temp:  98.5 F (36.9 C) 98.1 F (36.7 C) 98.6 F (37 C)  TempSrc:  Axillary Oral Oral  SpO2: 100% 100% 100% 100%  Weight:      Height:        CBC:  Recent Labs  Lab 10/11/19 2345  WBC 8.2  NEUTROABS 4.8  HGB 10.4*  HCT 33.2*  MCV 92.0  PLT [id number]    Basic Metabolic Panel:  Recent Labs  Lab 10/11/19 2345  NA 138  K 4.2  CL 104  CO2 26  GLUCOSE 122*  BUN 21*  CREATININE 1.52*  CALCIUM 8.5*   Lipid Panel:     Component Value Date/Time   CHOL 123 10/12/2019 0319   TRIG 49 10/12/2019 0319   HDL 49 10/12/2019 0319   CHOLHDL 2.5 10/12/2019 0319   VLDL 10 10/12/2019 0319   LDLCALC 64 10/12/2019 0319   HgbA1c:  Lab Results  Component Value Date   HGBA1C 6.5 (H) 10/12/2019   Urine Drug Screen:     Component Value Date/Time   LABOPIA NONE DETECTED 07/14/2019 1616   COCAINSCRNUR NONE DETECTED 07/14/2019 1616   LABBENZ NONE DETECTED 07/14/2019 1616   AMPHETMU NONE DETECTED 07/14/2019 1616   THCU NONE DETECTED 07/14/2019 1616   LABBARB NONE DETECTED 07/14/2019 1616    Alcohol Level     Component Value Date/Time   ETH <10 07/14/2019 1511    IMAGING [name]  Result Date: 10/12/2019 CLINICAL DATA:  Initial evaluation for acute right lower extremity weakness, fall. EXAM: MRI HEAD WITHOUT CONTRAST TECHNIQUE: Multiplanar, multiecho pulse  sequences of the brain and surrounding structures were obtained without intravenous contrast. COMPARISON:  Prior head CT from 06/30/2019. FINDINGS: Brain: Examination mildly degraded by motion artifact. Cerebral volume within normal limits for age. Patchy T2/FLAIR hyperintensity within the periventricular and deep white matter both cerebral hemispheres noted, nonspecific, but most like related chronic microvascular ischemic disease, mild in nature. Patchy focus of restricted diffusion measuring approximately 12 mm seen involving the cortical and subcortical aspect of the parasagittal posterior left frontal lobe, left ACA distribution (series 9, images 81, 83). No associated hemorrhage or mass effect. No other evidence for acute or subacute ischemia. Gray-white matter differentiation otherwise maintained. No other areas of remote cortical infarction. No foci of susceptibility artifact to suggest acute or chronic intracranial hemorrhage. No mass lesion, midline shift or mass effect. No hydrocephalus. No extra-axial fluid collection. Pituitary gland suprasellar region normal. Midline structures intact. Vascular: Major intracranial vascular flow voids are maintained. Skull and upper cervical spine: Craniocervical junction normal. Upper cervical spine within normal limits. Diffusely decreased T1 signal intensity seen throughout the visualized bone marrow, nonspecific, but most commonly related to anemia, smoking, or obesity. No discrete osseous lesions. Scalp soft tissues within normal limits. Sinuses/Orbits: Globes  and orbital soft tissues within normal limits. Paranasal sinuses are clear. Small bilateral mastoid effusions noted, of doubtful significance. Visualized nasopharynx within normal limits. Inner ear structures grossly normal. Other: None. IMPRESSION: 1. 12 mm acute ischemic nonhemorrhagic posterior left frontal lobe infarct, left ACA distribution. No associated hemorrhage or mass effect. 2. Underlying mild  chronic microvascular ischemic disease. Electronically Signed   By: Jeannine Boga M.D.   On: 10/12/2019 01:13   [name]  Result Date: 10/12/2019 CLINICAL DATA:  Initial evaluation for acute right lower extremity weakness. EXAM: MRI LUMBAR SPINE WITHOUT CONTRAST TECHNIQUE: Multiplanar, multisequence MR imaging of the lumbar spine was performed. No intravenous contrast was administered. COMPARISON:  None. FINDINGS: Segmentation: Standard. Lowest well-formed disc space labeled the L5-S1 level. Alignment: Physiologic with preservation of the normal lumbar lordosis. No listhesis. Vertebrae: Vertebral body height maintained without evidence for acute or chronic fracture. Bone marrow signal intensity diffusely decreased on T1 weighted imaging, nonspecific, but most commonly related to anemia, smoking, or obesity. Few scattered benign hemangiomata noted. No other discrete or worrisome osseous lesions. No abnormal marrow edema. Conus medullaris and cauda equina: Conus extends to the L1-2 level. Conus and cauda equina appear normal. Paraspinal and other soft tissues: Paraspinous soft tissues within normal limits. Visualized visceral structures are unremarkable. Disc levels: L1-2:  Unremarkable. L2-3:  Unremarkable. L3-4: Normal interspace. Mild bilateral facet hypertrophy. No significant stenosis. L4-5: Minimal annular disc bulge with disc desiccation. Moderate bilateral facet hypertrophy. Associated trace joint effusion on the left. No significant spinal stenosis. Mild right L4 foraminal narrowing. L5-S1: Minimal annular disc bulge with disc desiccation. Moderate left greater than right facet hypertrophy. Associated small bilateral joint effusions. Epidural lipomatosis. No significant spinal stenosis. Mild left L5 foraminal narrowing. IMPRESSION: 1. No acute abnormality within the lumbar spine. No significant spinal stenosis or evidence for neural impingement. 2. Moderate facet arthropathy at  L4-5 and L5-S1 with resultant mild right L4 and left L5 foraminal stenosis. 3. Diffusely decreased T1 marrow signal intensity, nonspecific, but most commonly related to anemia, smoking, or obesity. Electronically Signed   By: Jeannine Boga M.D.   On: 10/12/2019 01:19    PHYSICAL EXAM  Temp:  [98.1 F (36.7 C)-98.9 F (37.2 C)] 98.9 F (37.2 C) (11/03 1140) Pulse Rate:  [50-70] 61 (11/03 1055) Resp:  [15-28] 20 (11/03 1140) BP: (107-144)/(54-94) 127/78 (11/03 1140) SpO2:  [99 %-100 %] 100 % (11/03 1140) Weight:  [117.9 kg] 117.9 kg (11/02 2310)  General - Well nourished, well developed, in no apparent distress.  Ophthalmologic - fundi not visualized due to noncooperation.  Cardiovascular - Regular rhythm and rate.  Mental Status -  Level of arousal and orientation to time, place, and person were intact. Language including expression, naming, repetition, comprehension was assessed and found intact.  Cranial Nerves II - XII - II - Visual field intact OU. III, IV, VI - Extraocular movements intact. V - Facial sensation intact bilaterally. VII - Facial movement intact bilaterally. VIII - Hearing & vestibular intact bilaterally. X - Palate elevates symmetrically. XI - Chin turning & shoulder shrug intact bilaterally. XII - Tongue protrusion intact.  Motor Strength - The patient's strength was normal in all extremities except RLE 5-/5 and pronator drift was absent.  Bulk was normal and fasciculations were absent.   Motor Tone - Muscle tone was assessed at the neck and appendages and was normal.  Reflexes - The patient's reflexes were symmetrical in all extremities and she had no pathological reflexes.  Sensory - Light touch, temperature/pinprick were assessed and were symmetrical.    Coordination - The patient had normal movements in the hands with no ataxia or dysmetria.  Tremor was absent.  Gait and Station - deferred.   ASSESSMENT/PLAN [name] is a 48  y.o. female with history of prior cryptogenic stroke, DB, HTN, morbid obesity, partial complex sz and OSA presenting after a fall at home with RLE weakness, R leg numbness.   Stroke:   L ACA territory small patchy infarct, embolic pattern, could be secondary to cardiomyopathy  MRI  Posterior L frontal lobe infarct L ACA territory. Small vessel disease.   MRA  unremarkable  Carotid Doppler  pending  2D Echo EF 35-40%  LDL 64  HgbA1c 6.5  Lovenox 40 mg sq daily for VTE prophylaxis  No antithrombotic prior to admission, now on aspirin 81 mg daily and clopidogrel 75 mg daily.   Therapy recommendations:  pending   Disposition:  pending   Hx stroke/TIA  05/2017 presenting w/ LUE numbness.  MRI showed R perirolandic small infarct likely embolic source.  MRA unremarkable, LDL 82, A1c 7.4.  Carotid Doppler negative.  EF 40 to 45%. Discharged with aspirin and Lipitor 10. Had outpatient TEE unremarkable, no PFO.  Loop recorder placed 05/30/2017, no A. fib found, and removed 03/04/2018 at pt request (due to shocking sensation as per patient).    Follows at [facility], no antiplatelet PTA  Cardiomyopathy  05/2017 EF 40 to 45%  Current admission EF 35 to 40%  Patient to strokes in 2018 and this admission all consistent with embolic source.  Loop recorder negative for A. fib.  Concerning for cardiomyopathy as the source of emboli  Patient has no cardiology follow-up as outpatient  Recommend cardiology consultation for CHF management  Hypertension  Stable . Permissive hypertension (OK if < 220/120) but gradually normalize in 5-7 days . Long-term BP goal normotensive  Hyperlipidemia  Home meds:  lipitor 10  Now on lipitor 40  LDL 64, goal < 70  Continue statin at discharge  Diabetes type II Controlled  HgbA1c 6.5, goal < 7.0  CBGs  SSI   PCP follow-up  Partial complex sz d/o w/ intractable epilepsy  Followed by Dr. Jannifer Franklin at [facility]  Has been on multiple AEDs in the past, but  not tolerating  Currently on Lamictal  Continue Lamictal and follow-up with Dr. Jannifer Franklin at [facility]  Other Stroke Risk Factors  Morbid Obesity, Body mass index is 49.13 kg/m., recommend weight loss, diet and exercise as appropriate   Migraines - followed by Dr. Jannifer Franklin  Obstructive sleep apnea, on CPAP at hs   Other Active Problems  Bipolar 1 d/o on Abilify and lamotrigine  Mild mental retardation  Depression on Abilify  CKD stage 3, creatinine 1.52  Hospital day # 0  Rosalin Hawking, MD PhD Stroke Neurology 10/12/2019 2:21 PM    To contact Stroke Continuity provider, please refer to [email or web address] After hours, contact General Neurology

## 2019-10-12 NOTE — Progress Notes (Signed)
Arrived from ED at 0350. Alert and oriented. Denies any pain. Call light within reach.

## 2019-10-12 NOTE — ED Notes (Signed)
Pt informed RN that she uses a Cpap at night.  RN requested that Secretary page admitting provider to order a CPap.  Provider called back and will order.

## 2019-10-12 NOTE — ED Notes (Signed)
RN called floor back, pt can now transport to floor.

## 2019-10-12 NOTE — ED Notes (Signed)
Informed provider that pt did pass swallow screen test

## 2019-10-12 NOTE — H&P (Signed)
History and Physical    Brandi Gomez UJW:[id number] DOB: 1971/10/21 DOA: 10/11/2019  PCP: Horald Pollen, MD Patient coming from: Home  Chief Complaint: Right leg weakness  HPI: Brandi Gomez is a 48 y.o. female with medical history significant of anemia, anxiety, bipolar disorder, depression, type 2 diabetes, CKD stage III, CVA, hypertension, hyperlipidemia, IBS, mild mental retardation, obesity (BMI 49.13), seizures, sleep apnea presenting with complaints of right leg weakness.  Patient states [place] and experienced acute onset right leg weakness around 5 or 6 PM yesterday.  The right side of her body felt heavy and she fell on the ground but did not injure herself as she caught herself with her hands.  She did not hit her head.  States her leg continues to feel weak and she is not able to walk.  No blurry vision or difficulty with speech.  No numbness or tingling.  Reports prior stroke 5 years ago but is currently not on aspirin or any other antiplatelet agent.  She does not smoke cigarettes.  ED Course: MRI of lumbar spine showing no acute abnormality, no significant spinal stenosis or evidence of neural impingement.  Brain MRI showing a 12 mm acute ischemic nonhemorrhagic posterior left frontal lobe infarct, left ACA distribution.  No associated hemorrhage or mass-effect. Aspirin 324 mg ordered.  Neurology consulted.  Review of Systems:  All systems reviewed and apart from history of presenting illness, are negative.  Past Medical History:  Diagnosis Date   Anemia    Anxiety    Bipolar 1 disorder ([place])    Common migraine 05/19/2015   Depression    Diabetes mellitus, type II ([place])    Hypertension    Irritable bowel syndrome (IBS)    Mild mental retardation    Obesity    Partial complex seizure disorder with intractable epilepsy ([place]) 05/12/2014   Seizures ([place])    intractable, sz 08/23/17   Sleep apnea    Stroke ([place])     Type II or unspecified type diabetes mellitus without mention of complication, not stated as uncontrolled     Past Surgical History:  Procedure Laterality Date   COLONOSCOPY     2012-normal , Dr Sharlett Iles   ESOPHAGOGASTRODUODENOSCOPY     normal-Dr Patterson 2012   LOOP RECORDER INSERTION N/A 05/30/2017   Procedure: Loop Recorder Insertion;  Surgeon: Constance Haw, MD;  Location: [facility];  Service: Cardiovascular;  Laterality: N/A;   LOOP RECORDER REMOVAL N/A 03/04/2018   Procedure: LOOP RECORDER REMOVAL;  Surgeon: Constance Haw, MD;  Location: [facility];  Service: Cardiovascular;  Laterality: N/A;   MYRINGOTOMY WITH TUBE PLACEMENT     MYRINGOTOMY WITH TUBE PLACEMENT Right 11/05/2017   Procedure: MYRINGOTOMY WITH TUBE PLACEMENT;  Surgeon: Melissa Montane, MD;  Location: [facility];  Service: ENT;  Laterality: Right;  right T Tube placement   NASAL SINUS SURGERY     TEE WITHOUT CARDIOVERSION N/A 05/30/2017   Procedure: TRANSESOPHAGEAL ECHOCARDIOGRAM (TEE);  Surgeon: Acie Fredrickson Wonda Cheng, MD;  Location: [facility];  Service: Cardiovascular;  Laterality: N/A;     reports that she has never smoked. She has never used smokeless tobacco. She reports that she does not drink alcohol or use drugs.  Allergies  Allergen Reactions   Amoxicillin Itching    Has patient had a PCN reaction causing immediate rash, facial/tongue/throat swelling, SOB or lightheadedness with hypotension: yes Has patient had a PCN reaction causing severe rash  involving mucus membranes or skin necrosis: no Has patient had a PCN reaction that required hospitalization: no Has patient had a PCN reaction occurring within the last 10 years: yes If all of the above answers are "NO", then may proceed with Cephalosporin use.   Lisinopril Swelling    Angioedema    Hydrocodone Other (See Comments)    Depressed    Tegretol [Carbamazepine] Swelling    Throat swells    Family History  Problem  Relation Age of Onset   Diabetes Mother        passed away from accidental death   Hypertension Mother    Bipolar disorder Father    Diabetes Daughter    Leukemia Daughter    Ovarian cancer Maternal Aunt     Prior to Admission medications   Medication Sig Start Date End Date Taking? Authorizing Provider  ARIPiprazole (ABILIFY) 10 MG tablet Take 10 mg by mouth daily. 09/18/19  Yes [provider]  atorvastatin (LIPITOR) 10 MG tablet Take 1 tablet (10 mg total) by mouth daily at 6 PM. 05/12/19 10/11/28 Yes Sagardia, Ines Bloomer, MD  carvedilol (COREG) 3.125 MG tablet TAKE 1 TABLET (3.125 MG TOTAL) BY MOUTH DAILY. 05/18/19  Yes Sagardia, Ines Bloomer, MD  lamoTRIgine (LAMICTAL) 25 MG tablet TAKE 3 TABLETS TWICE DAILY Patient taking differently: Take 75 mg by mouth 2 (two) times daily.  09/20/19  Yes [name], NP  sitaGLIPtin (JANUVIA) 50 MG tablet Take 1 tablet by mouth daily. 08/11/19  Yes Sagardia, Ines Bloomer, MD  torsemide (DEMADEX) 10 MG tablet Take 10 mg by mouth daily. 10/03/19  Yes [provider]  Vitamin D, Ergocalciferol, (DRISDOL) 1.25 MG (50000 UT) CAPS capsule TAKE 1 CAPSULE (50,000 UNITS TOTAL) BY MOUTH EVERY 7 (SEVEN) DAYS. Patient taking differently: Take 50,000 Units by mouth every Tuesday.  09/22/19  Yes Sagardia, Ines Bloomer, MD  ARIPiprazole (ABILIFY) 5 MG tablet Take 1 tablet (5 mg total) by mouth daily. Patient not taking: Reported on 10/12/2019 09/08/19   Norma Fredrickson, MD  Blood Glucose Monitoring Suppl KIT Please supply kit covered by insurance and supplies for testing four times daily. E11.9 E10.9 05/12/19   Horald Pollen, MD  Blood Pressure Monitor KIT Take blood pressure reading twice a day for the next 2 weeks and then once a day. 05/12/19   Horald Pollen, MD  triamcinolone (KENALOG) 0.1 % paste Use as directed 1 application in the mouth or throat 2 (two) times daily. Patient not taking: Reported on 08/23/2019 07/26/19   Zigmund Gottron, NP    Physical Exam: Vitals:   10/12/19 0015 10/12/19 0145 10/12/19 0200 10/12/19 0245  BP: 118/81 130/77 130/84 (!) 144/75  Pulse: 64 (!) 53 (!) 56 70  Resp: 16 (!) 23 (!) 24 (!) 21  Temp:      TempSrc:      SpO2: 100% 100% 100% 100%  Weight:      Height:        Physical Exam  Constitutional: She is oriented to person, place, and time. She appears well-developed and well-nourished. No distress.  HENT:  Head: Normocephalic.  Mouth/Throat: Oropharynx is clear and moist.  Eyes: EOM are normal. Right eye exhibits no discharge. Left eye exhibits no discharge.  Neck: Neck supple.  Cardiovascular: Normal rate, regular rhythm and intact distal pulses.  Pulmonary/Chest: Effort normal and breath sounds normal. No respiratory distress. She has no wheezes. She has no rales.  Abdominal: Soft. Bowel sounds are normal. She  exhibits no distension. There is no abdominal tenderness. There is no guarding.  Musculoskeletal:        General: No edema.  Neurological: She is alert and oriented to person, place, and time.  Speech fluent, tongue midline, and no facial droop.  Strength 5 out of 5 in bilateral upper extremities.  Strength 5 out of 5 upon dorsiflexion and plantarflexion of bilateral lower extremities.  She is able to raise her left lower extremity against gravity without any difficulty but has difficulty lifting her right lower extremity up from the bed against gravity.  Sensation to light touch intact throughout.  Skin: Skin is warm and dry. She is not diaphoretic.     Labs on Admission: I have personally reviewed following labs and imaging studies  CBC: Recent Labs  Lab 10/11/19 2345  WBC 8.2  NEUTROABS 4.8  HGB 10.4*  HCT 33.2*  MCV 92.0  PLT 465   Basic Metabolic Panel: Recent Labs  Lab 10/11/19 2345  NA 138  K 4.2  CL 104  CO2 26  GLUCOSE 122*  BUN 21*  CREATININE 1.52*  CALCIUM 8.5*   GFR: Estimated Creatinine Clearance: 53 mL/min (A) (by C-G  formula based on SCr of 1.52 mg/dL (H)). Liver Function Tests: Recent Labs  Lab 10/11/19 2345  AST 17  ALT 12  ALKPHOS 75  BILITOT 0.8  PROT 6.9  ALBUMIN 3.3*   No results for input(s): LIPASE, AMYLASE in the last 168 hours. No results for input(s): AMMONIA in the last 168 hours. Coagulation Profile: Recent Labs  Lab 10/12/19 0232  INR 1.0   Cardiac Enzymes: No results for input(s): CKTOTAL, CKMB, CKMBINDEX, TROPONINI in the last 168 hours. BNP (last 3 results) No results for input(s): PROBNP in the last 8760 hours. HbA1C: Recent Labs    10/12/19 0319  HGBA1C 6.5*   CBG: No results for input(s): GLUCAP in the last 168 hours. Lipid Profile: No results for input(s): CHOL, HDL, LDLCALC, TRIG, CHOLHDL, LDLDIRECT in the last 72 hours. Thyroid Function Tests: No results for input(s): TSH, T4TOTAL, FREET4, T3FREE, THYROIDAB in the last 72 hours. Anemia Panel: No results for input(s): VITAMINB12, FOLATE, FERRITIN, TIBC, IRON, RETICCTPCT in the last 72 hours. Urine analysis:    Component Value Date/Time   COLORURINE YELLOW 08/28/2019 1906   APPEARANCEUR CLEAR 08/28/2019 1906   LABSPEC 1.014 08/28/2019 1906   PHURINE 5.0 08/28/2019 1906   GLUCOSEU NEGATIVE 08/28/2019 1906   HGBUR NEGATIVE 08/28/2019 1906   BILIRUBINUR NEGATIVE 08/28/2019 1906   BILIRUBINUR negative 02/10/2018 1020   BILIRUBINUR negative 07/01/2015 1108   [place] 08/28/2019 1906   PROTEINUR NEGATIVE 08/28/2019 1906   UROBILINOGEN 0.2 05/03/2019 1322   NITRITE NEGATIVE 08/28/2019 1906   LEUKOCYTESUR NEGATIVE 08/28/2019 1906    Radiological Exams on Admission: [name]  Result Date: 10/12/2019 CLINICAL DATA:  Initial evaluation for acute right lower extremity weakness, fall. EXAM: MRI HEAD WITHOUT CONTRAST TECHNIQUE: Multiplanar, multiecho pulse sequences of the brain and surrounding structures were obtained without intravenous contrast. COMPARISON:  Prior head CT from 06/30/2019.  FINDINGS: Brain: Examination mildly degraded by motion artifact. Cerebral volume within normal limits for age. Patchy T2/FLAIR hyperintensity within the periventricular and deep white matter both cerebral hemispheres noted, nonspecific, but most like related chronic microvascular ischemic disease, mild in nature. Patchy focus of restricted diffusion measuring approximately 12 mm seen involving the cortical and subcortical aspect of the parasagittal posterior left frontal lobe, left ACA distribution (series 9, images 81, 83).  No associated hemorrhage or mass effect. No other evidence for acute or subacute ischemia. Gray-white matter differentiation otherwise maintained. No other areas of remote cortical infarction. No foci of susceptibility artifact to suggest acute or chronic intracranial hemorrhage. No mass lesion, midline shift or mass effect. No hydrocephalus. No extra-axial fluid collection. Pituitary gland suprasellar region normal. Midline structures intact. Vascular: Major intracranial vascular flow voids are maintained. Skull and upper cervical spine: Craniocervical junction normal. Upper cervical spine within normal limits. Diffusely decreased T1 signal intensity seen throughout the visualized bone marrow, nonspecific, but most commonly related to anemia, smoking, or obesity. No discrete osseous lesions. Scalp soft tissues within normal limits. Sinuses/Orbits: Globes and orbital soft tissues within normal limits. Paranasal sinuses are clear. Small bilateral mastoid effusions noted, of doubtful significance. Visualized nasopharynx within normal limits. Inner ear structures grossly normal. Other: None. IMPRESSION: 1. 12 mm acute ischemic nonhemorrhagic posterior left frontal lobe infarct, left ACA distribution. No associated hemorrhage or mass effect. 2. Underlying mild chronic microvascular ischemic disease. Electronically Signed   By: Jeannine Boga M.D.   On: 10/12/2019 01:13   Mr Lumbar Spine Wo  Contrast  Result Date: 10/12/2019 CLINICAL DATA:  Initial evaluation for acute right lower extremity weakness. EXAM: MRI LUMBAR SPINE WITHOUT CONTRAST TECHNIQUE: Multiplanar, multisequence MR imaging of the lumbar spine was performed. No intravenous contrast was administered. COMPARISON:  None. FINDINGS: Segmentation: Standard. Lowest well-formed disc space labeled the L5-S1 level. Alignment: Physiologic with preservation of the normal lumbar lordosis. No listhesis. Vertebrae: Vertebral body height maintained without evidence for acute or chronic fracture. Bone marrow signal intensity diffusely decreased on T1 weighted imaging, nonspecific, but most commonly related to anemia, smoking, or obesity. Few scattered benign hemangiomata noted. No other discrete or worrisome osseous lesions. No abnormal marrow edema. Conus medullaris and cauda equina: Conus extends to the L1-2 level. Conus and cauda equina appear normal. Paraspinal and other soft tissues: Paraspinous soft tissues within normal limits. Visualized visceral structures are unremarkable. Disc levels: L1-2:  Unremarkable. L2-3:  Unremarkable. L3-4: Normal interspace. Mild bilateral facet hypertrophy. No significant stenosis. L4-5: Minimal annular disc bulge with disc desiccation. Moderate bilateral facet hypertrophy. Associated trace joint effusion on the left. No significant spinal stenosis. Mild right L4 foraminal narrowing. L5-S1: Minimal annular disc bulge with disc desiccation. Moderate left greater than right facet hypertrophy. Associated small bilateral joint effusions. Epidural lipomatosis. No significant spinal stenosis. Mild left L5 foraminal narrowing. IMPRESSION: 1. No acute abnormality within the lumbar spine. No significant spinal stenosis or evidence for neural impingement. 2. Moderate facet arthropathy at L4-5 and L5-S1 with resultant mild right L4 and left L5 foraminal stenosis. 3. Diffusely decreased T1 marrow signal intensity, nonspecific,  but most commonly related to anemia, smoking, or obesity. Electronically Signed   By: Jeannine Boga M.D.   On: 10/12/2019 01:19    EKG: Independently reviewed.  Sinus rhythm.  Assessment/Plan Principal Problem:   Acute CVA (cerebrovascular accident) ([place]) Active Problems:   OSA (obstructive sleep apnea)   Bipolar 1 disorder ([facility])   HLD (hyperlipidemia)   CKD (chronic kidney disease) stage 3, GFR 30-59 ml/min   Acute CVA Patient presenting with complaints of acute onset right lower extremity weakness.  Symptom onset between 5-6 PM yesterday. Brain MRI showing a 12 mm acute ischemic nonhemorrhagic posterior left frontal lobe infarct, left ACA distribution.  No associated hemorrhage or mass-effect. -Neurology consulted, appreciate recommendations -Telemetry monitoring -Allow for permissive hypertension-treat only if systolic blood pressures are greater than 220. -Carotid Dopplers -2D  echocardiogram -Hemoglobin A1c, fasting lipid panel -Received aspirin 324 mg in the ED.  Continue aspirin 325 mg daily. -Atorvastatin 80 mg daily -Frequent neurochecks -PT, OT, speech therapy. -N.p.o. until cleared by bedside swallow evaluation or formal speech evaluation  CKD stage III -Stable.  Creatinine 1.5, no significant change compared to labs done 08/28/2019.  Non-insulin-dependent diabetes mellitus -Check A1c.  Sliding scale insulin sensitive and CBG checks.  Hyperlipidemia -Statin  Sleep apnea -Uses CPAP nightly.  Continue.  Bipolar disorder -Continue home Abilify, lamotrigine  DVT prophylaxis: Lovenox Code Status: Full code Family Communication: No family available. Disposition Plan: Anticipate discharge after clinical improvement. Consults called: None Admission status: It is my clinical opinion that referral for OBSERVATION is reasonable and necessary in this patient based on the above information provided. The aforementioned taken together are felt to place the patient at  high risk for further clinical deterioration. However it is anticipated that the patient may be medically stable for discharge from the hospital within 24 to 48 hours.  The medical decision making on this patient was of high complexity and the patient is at high risk for clinical deterioration, therefore this is a level 3 visit.  Shela Leff MD [facility] Pager [phone]  If 7PM-7AM, please contact night-coverage [email or web address] Password [facility]  10/12/2019, 3:48 AM

## 2019-10-12 NOTE — Progress Notes (Signed)
  Echocardiogram 2D Echocardiogram has been performed.  Brandi Gomez 10/12/2019, 11:07 AM

## 2019-10-12 NOTE — Progress Notes (Signed)
Occupational Therapy Evaluation Patient Details Name: Brandi Gomez MRN: [id number] DOB: [birth year] Today's Date: 10/12/2019    History of Present Illness (P) Brandi Gomez is a 48 y.o. female with medical history significant of anemia, anxiety, bipolar disorder, depression, type 2 diabetes, CKD stage III, CVA, hypertension, hyperlipidemia, IBS, mild mental retardation, obesity (BMI 49.13), seizures, sleep apnea presenting with complaints of right leg weakness.  MRI revealed L posterior frontal lobe CVA.   Clinical Impression   PTA, pt was living at home with her daughter and pt's friend, pt reports she was independent with ADL/IADL except cooking due to memory limitations. Pt reports she was independent with functional mobility. Pt currently requires maxA for LB ADL and minguard - minA for UB ADL. She requires minA+2-modA+2 for functional mobility at RW level with LOB x1 requiring physical assistance from therapists to correct. Session was limited this date secondary to arrival of staff to transport pt to MRI, further assessment of pt's cognition and vision warranted. Due to decline in current level of function, pt would benefit from acute OT to address established goals to facilitate safe D/C to venue listed below. At this time, recommend CIR follow-up. Will continue to follow acutely.     Follow Up Recommendations  CIR    Equipment Recommendations  3 in 1 bedside commode    Recommendations for Other Services       Precautions / Restrictions Precautions Precautions: Fall Restrictions Weight Bearing Restrictions: No      Mobility Bed Mobility Overal bed mobility: Needs Assistance Bed Mobility:  Rolling;Sidelying to Sit Rolling:  Supervision Sidelying to sit:Min guard       General bed mobility comments: pt with use of bed rails to progress trunk to upright position;minguard for safety with mobility  Transfers Overall transfer level: Needs assistance Equipment  used: Rolling walker (2 wheeled) Transfers: Sit to/from [facility] Sit to Stand:  Min assist;+2 safety/equipment Stand pivot transfers:  Min assist;+2 safety/equipment       General transfer comment: minA +2 for safety, noted R knee weakness with initial standing. vc's for hand placement but pt did not heed, needed manual facilitation of hand placement    Balance Overall balance assessment:  Needs assistance Sitting-balance support: No upper extremity supported;Feet supported Sitting balance-Leahy Scale: Fair Sitting balance - Comments: able to sit EOB without LOB or physical assistance required for stability   Standing balance support: Bilateral upper extremity supported Standing balance-Leahy Scale: Poor Standing balance comment: 1x LOB with ambulation required assistance from therapists to correct;                           ADL either performed or assessed with clinical judgement   ADL Overall ADL's : Needs assistance/impaired Eating/Feeding: Set up;Sitting   Grooming: Set up;Sitting   Upper Body Bathing: Min guard;Sitting   Lower Body Bathing: Maximal assistance;Sitting/lateral leans Lower Body Bathing Details (indicate cue type and reason): maxA to access BLE due to large body habitus Upper Body Dressing : Minimal assistance;Sitting   Lower Body Dressing: Maximal assistance;Sitting/lateral leans Lower Body Dressing Details (indicate cue type and reason): due to large body habitus Toilet Transfer: Moderate assistance;+2 for safety/equipment;Minimal assistance Toilet Transfer Details (indicate cue type and reason): minA+2 for safety for stand pivot transfer progressing to modA +2 for ambulation for simulated toilet transfer with 1 LOB requiring increased support to correct Toileting- Clothing Manipulation and Hygiene: Moderate assistance;+2 for safety/equipment  Functional mobility during ADLs: Moderate assistance;Minimal assistance;+2 for  safety/equipment;+2 for physical assistance General ADL Comments: decreased activity tolerance, weakness, cognitive limitations, and decreased stability impacting safety with ADL completion      Vision Baseline Vision/History: Wears glasses Wears Glasses: Reading only Patient Visual Report: Blurring of vision Additional Comments: further assessment warranted     Perception     Praxis      Pertinent Vitals/Pain Pain Assessment:  No/denies pain     Hand Dominance Right   Extremity/Trunk Assessment Upper Extremity Assessment Upper Extremity Assessment:  Defer to OT evaluation RUE Deficits / Details: grossly 3-/5;R weaker than L;slower rapid alternative movements; required finger to thumb one hand at a time for increased speed, attempted both hands at the same time and demonstrated increased difficulty RUE Sensation: WNL RUE Coordination: WNL LUE Deficits / Details: grossly 3/5 LUE Sensation: WNL LUE Coordination: WNL   Lower Extremity Assessment Lower Extremity Assessment: RLE deficits/detail;LLE deficits/detail RLE Deficits / Details: knee ext 3+/5, R knee buckling occaisonally in standing, as much a neurological issue as strength. Hip flex 3/5, knee flex 4/5 RLE Sensation: WNL RLE Coordination: decreased gross motor LLE Deficits / Details: grossly 4+/5 throughout LLE Sensation: WNL LLE Coordination:  WNL   Cervical / Trunk Assessment Cervical / Trunk Assessment: Normal   Communication Communication Communication:  Expressive difficulties(slow)   Cognition Arousal/Alertness: Awake/alert Behavior During Therapy:  WFL for tasks assessed/performed;Impulsive Overall Cognitive Status:  No family/caregiver present to determine baseline cognitive functioning Area of Impairment: Attention;Memory;Following commands;Safety/judgement;Awareness;Problem solving                   Current Attention Level:  Sustained Memory: Decreased recall of precautions Following  Commands: Follows one step commands with increased time;Follows multi-step commands inconsistently Safety/Judgement: Decreased awareness of safety;Decreased awareness of deficits Awareness: Emergent Problem Solving:  Slow processing;Difficulty sequencing;Requires tactile cues;Requires verbal cues General Comments:  pt demonstrated difficulty with motor planning, decreased safety awareness;had 1 LOB and did not appear to have a change in affect with it, continued to require multimodal cues for proper use of RW;slow processing;further assessment of cognition warranted   General Comments  VSS throughout;session limited secondary to arrival of transport to bring pt to MRI    Exercises     Shoulder Instructions      Home Living Family/patient expects to be discharged to::  Private residence Living Arrangements:  Children Available Help at Discharge: Family;Friend(s);Available 24 hours/day Type of Home: House Home Access: Stairs to enter [employer] of Steps: 4-5 Entrance Stairs-Rails: Right;Left Home Layout:  One level     Bathroom Shower/Tub: Tub/shower unit         Home Equipment: None   Additional Comments: pt lives with her daughter and a friend       Prior Functioning/Environment Level of Independence: Needs assistance  Gait / Transfers Assistance Needed:independent without AD ADL's / Homemaking Assistance Needed: pt reports she did not cook due to memory deficits;but otherwise was independent with ADL            OT Problem List: Decreased strength;Decreased range of motion;Decreased activity tolerance;Impaired balance (sitting and/or standing);Decreased coordination;Decreased cognition;Decreased safety awareness;Decreased knowledge of use of DME or AE;Impaired vision/perception;Decreased knowledge of precautions;Obesity      OT Treatment/Interventions: Self-care/ADL training;Therapeutic exercise;Energy conservation;DME and/or AE instruction;Therapeutic  activities;Visual/perceptual remediation/compensation;Cognitive remediation/compensation;Patient/family education;Balance training    OT Goals(Current goals can be found in the care plan section) Acute Rehab OT Goals Patient Stated Goal: to go home OT Goal Formulation: With  patient Time For Goal Achievement: 10/26/19 Potential to Achieve Goals: Good ADL Goals Pt Will Perform Grooming: with set-up;standing Pt Will Perform Upper Body Dressing: with set-up;sitting;standing Pt Will Perform Lower Body Dressing: with set-up;sit to/from stand Pt Will Transfer to Toilet: with min guard assist;ambulating Additional ADL Goal #1: Pt will demonstrate 3 compensatory strategies for memory deficits. Additional ADL Goal #2: Pt will follow multi-step commands consistently.  OT Frequency: Min 2X/week   Barriers to D/C: Inaccessible home environment  4-5 steps to enter house       Co-evaluation PT/OT/SLP Co-Evaluation/Treatment: Yes Reason for Co-Treatment: (P) Complexity of the patient's impairments (multi-system involvement);Necessary to address cognition/behavior during functional activity;For patient/therapist safety PT goals addressed during session: (P) Mobility/safety with mobility;Balance;Proper use of DME OT goals addressed during session: ADL's and self-care      AM-PAC OT "6 Clicks" Daily Activity     Outcome Measure Help from another person eating meals?: A Little Help from another person taking care of personal grooming?: A Little Help from another person toileting, which includes using toliet, bedpan, or urinal?: A Lot Help from another person bathing (including washing, rinsing, drying)?: A Little Help from another person to put on and taking off regular upper body clothing?: A Little Help from another person to put on and taking off regular lower body clothing?: A Lot 6 Click Score: 16   End of Session Equipment Utilized During Treatment: Gait belt;Rolling walker Nurse  Communication: Mobility status  Activity Tolerance: Patient tolerated treatment well(session limited due to arrival of transport to take pt to MR) Patient left: in bed;with call bell/phone within reach;with bed alarm set  OT Visit Diagnosis: Other abnormalities of gait and mobility (R26.89);Unsteadiness on feet (R26.81);Muscle weakness (generalized) (M62.81);History of falling (Z91.81);Other symptoms and signs involving cognitive function                Time: [id number] OT Time Calculation (min): 19 min Charges:  OT General Charges $OT Visit: 1 Visit OT Evaluation $OT Eval Moderate Complexity: [place] OTR/L Acute Rehabilitation Services Office: [place] 10/12/2019, 12:51 PM

## 2019-10-12 NOTE — Progress Notes (Signed)
Carotid duplex exam completed. Prelim results are under CV Proc.  Salvadore Dom, RVT
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
## 2019-10-12 NOTE — Progress Notes (Signed)
48 year old female with a history of CKD stage III, type 2 diabetes mellitus, bipolar disorder, depression, hypertension hyperlipidemia who was admitted early morning with right leg weakness.  MRI brain shows 12 mm acute ischemic nonhemorrhagic posterior left frontal lobe infarct, left ACA distribution.  Stroke team/Neurology on board. Patient seen and examined.  She states that she still has right lower extremity weakness but it is improving.  No other weakness anywhere else.  No problems with speech, vision or swallowing or any dizziness. On exam she is alert and oriented.  Lungs clear to auscultation.  Abdomen soft and nontender.  She has 4/5 power in right lower extremity.  She is on antiplatelet.  We defer further management to cardiology.  She is getting Doppler carotid right now.  Transthoracic echo showed 35 to 40% ejection fraction.  Consulted cardiology per neurology recommendation for possible further ischemic cardiomyopathy work-up.

## 2019-10-12 NOTE — Evaluation (Signed)
Speech Language Pathology Evaluation Patient Details Name: Brandi Gomez MRN: [id number] DOB: [birth year] Today's Date: 10/12/2019 Time: [id number] SLP Time Calculation (min) (ACUTE ONLY): 20 min  Problem List:  Patient Active Problem List   Diagnosis Date Noted  . Acute CVA (cerebrovascular accident) ([place]) 10/12/2019  . HLD (hyperlipidemia) 10/12/2019  . CKD (chronic kidney disease) stage 3, GFR 30-59 ml/min 10/12/2019  . Acute ischemic stroke ([place]) 10/12/2019  . Depression 07/14/2019  . Bipolar disorder ([place]) 05/13/2019  . Bipolar 1 disorder ([place]) 05/13/2019  . Adjustment disorder with anxiety   . History of recent stroke 08/06/2017  . OSA (obstructive sleep apnea)   . Cryptogenic stroke ([place]) 05/20/2017  . Controlled type 2 diabetes mellitus without complication, without long-term current use of insulin ([place]) 05/08/2017  . Essential hypertension 07/27/2008  . Seizure disorder ([place]) 02/05/2007   Past Medical History:  Past Medical History:  Diagnosis Date  . Anemia   . Anxiety   . Bipolar 1 disorder ([place])   . Common migraine 05/19/2015  . Depression   . Diabetes mellitus, type II ([place])   . Hypertension   . Irritable bowel syndrome (IBS)   . Mild mental retardation   . Obesity   . Partial complex seizure disorder with intractable epilepsy ([place]) 05/12/2014  . Seizures ([place])    intractable, sz 08/23/17  . Sleep apnea   . Stroke ([place])   . Type II or unspecified type diabetes mellitus without mention of complication, not stated as uncontrolled    Past Surgical History:  Past Surgical History:  Procedure Laterality Date  . COLONOSCOPY     2012-normal , Dr Sharlett Iles  . ESOPHAGOGASTRODUODENOSCOPY     normal-Dr Patterson 2012  . LOOP RECORDER INSERTION N/A 05/30/2017   Procedure: Loop Recorder Insertion;  Surgeon: Constance Haw, MD;  Location: [facility];  Service: Cardiovascular;  Laterality: N/A;  . LOOP RECORDER REMOVAL N/A 03/04/2018   Procedure: LOOP  RECORDER REMOVAL;  Surgeon: Constance Haw, MD;  Location: [facility];  Service: Cardiovascular;  Laterality: N/A;  . MYRINGOTOMY WITH TUBE PLACEMENT    . MYRINGOTOMY WITH TUBE PLACEMENT Right 11/05/2017   Procedure: MYRINGOTOMY WITH TUBE PLACEMENT;  Surgeon: Melissa Montane, MD;  Location: [facility];  Service: ENT;  Laterality: Right;  right T Tube placement  . NASAL SINUS SURGERY    . TEE WITHOUT CARDIOVERSION N/A 05/30/2017   Procedure: TRANSESOPHAGEAL ECHOCARDIOGRAM (TEE);  Surgeon: Thayer Headings, MD;  Location[facility];  Service: Cardiovascular;  Laterality: N/A;   HPI:  Brandi Gomez is a 48 y.o. female with medical history significant of anemia, anxiety, bipolar disorder, depression, type 2 diabetes, CKD stage III, CVA, hypertension, hyperlipidemia, IBS, mild mental retardation, obesity (BMI 49.13), seizures, sleep apnea presenting with complaints of right leg weakness.  MRI revealed L posterior frontal lobe CVA. Pt is currently client of the [facility], where she sees Norma Fredrickson, MD.   Assessment / Plan / Recommendation Clinical Impression  Currently pt resides with her signficant other of 20 years Brandi Gomez) and her daughter. Pt doesn't drive, cook, manage money or medicine d/t baseline cognitive deficits. Pt's day consists of consuming meals and playing games on her phone (she enjoys completing word searches on her phone's app). During this cognitive lingusitic evaluation, pt presents with functional cognitive linguistic abilities. She is oriented x 4, is able to recall current informaiton related to symptoms/medical POC, demonstrates selective attention as well as insight  into current physical deficits. Pt communicates using functional language and provides great description of her history and current synopsis of daily activities. Pt's speech is intelligible. At this time, no aucte deficits are identifed by this Probation officer or by pt,  therefore skilled ST isn't indicated. ST to sign off.     SLP Assessment  SLP Recommendation/Assessment: Patient does not need any further Speech Lanaguage Pathology Services SLP Visit Diagnosis: Cognitive communication deficit (R41.841)    Follow Up Recommendations  None    Frequency and Duration   N/A        SLP Evaluation Cognition  Overall Cognitive Status: No family/caregiver present to determine baseline cognitive functioning Arousal/Alertness: Awake/alert Orientation Level: Oriented X4 Attention: Selective Selective Attention: Appears intact Memory: Appears intact(intact for recall of current symptoms and current situation/) Awareness: Appears intact Problem Solving: Appears intact Safety/Judgment: Appears intact       Comprehension  Auditory Comprehension Overall Auditory Comprehension: Appears within functional limits for tasks assessed Visual Recognition/Discrimination Discrimination: Within Function Limits Reading Comprehension Reading Status: Not tested    Expression Expression Primary Mode of Expression: Verbal Verbal Expression Overall Verbal Expression: Appears within functional limits for tasks assessed Written Expression Dominant Hand: Right Written Expression: Not tested   Oral / Motor  Oral Motor/Sensory Function Overall Oral Motor/Sensory Function: Within functional limits Motor Speech Overall Motor Speech: Appears within functional limits for tasks assessed Respiration: Within functional limits Phonation: Normal   GO                    Brandi Gomez 10/12/2019, 2:03 PM

## 2019-10-12 NOTE — Progress Notes (Signed)
Patient placed self on CPAP machine.  RT tightened the mask for patient comfort.  RT will continue to monitor.

## 2019-10-12 NOTE — ED Notes (Signed)
ED TO INPATIENT HANDOFF REPORT  ED Nurse Name and Phone #: Kathie Rhodes RN [phone]  S Name/Age/Gender Brandi Gomez 48 y.o. female Room/Bed: 034C/034C  Code Status   Code Status: Full Code  Home/SNF/Other Home Patient oriented to: self, place, time and situation Is this baseline? Yes   Triage Complete: Triage complete  Chief Complaint weakness  Triage Note Pt BIB [facility] from home. 6 HRS ago pt was ambulating and had weakness in R. Leg and fell. No LOC or injuries noted. Pt went home and still had weakness and decided to be seen.  No other neuro deficits noted. No injuires noted.   VSS with EMS. BP in the [id number] systolic. CBG was 120   Allergies Allergies  Allergen Reactions  . Amoxicillin Itching    Has patient had a PCN reaction causing immediate rash, facial/tongue/throat swelling, SOB or lightheadedness with hypotension: yes Has patient had a PCN reaction causing severe rash involving mucus membranes or skin necrosis: no Has patient had a PCN reaction that required hospitalization: no Has patient had a PCN reaction occurring within the last 10 years: yes If all of the above answers are "NO", then may proceed with Cephalosporin use.  Marland Kitchen Lisinopril Swelling    Angioedema   . Hydrocodone Other (See Comments)    Depressed   . Tegretol [Carbamazepine] Swelling    Throat swells    Level of Care/Admitting Diagnosis ED Disposition    ED Disposition Condition [facility] Area: [place] [100100]  Level of Care: Telemetry Medical [104]  I expect the patient will be discharged within 24 hours: Yes  LOW acuity---Tx typically complete <24 hrs---ACUTE conditions typically can be evaluated <24 hours---LABS likely to return to acceptable levels <24 hours---IS near functional baseline---EXPECTED to return to current living arrangement---NOT newly hypoxic: Meets criteria for 5C-Observation unit  Covid Evaluation: Asymptomatic Screening  Protocol (No Symptoms)  Diagnosis: [id number]  Admitting Physician: Shela Leff [id number]  Attending Physician: Shela Leff [id number]  PT Class (Do Not Modify): Observation [104]  PT Acc Code (Do Not Modify): Observation [10022]       B Medical/Surgery History Past Medical History:  Diagnosis Date  . Anemia   . Anxiety   . Bipolar 1 disorder ([place])   . Common migraine 05/19/2015  . Depression   . Diabetes mellitus, type II ([place])   . Hypertension   . Irritable bowel syndrome (IBS)   . Mild mental retardation   . Obesity   . Partial complex seizure disorder with intractable epilepsy ([place]) 05/12/2014  . Seizures ([place])    intractable, sz 08/23/17  . Sleep apnea   . Stroke ([place])   . Type II or unspecified type diabetes mellitus without mention of complication, not stated as uncontrolled    Past Surgical History:  Procedure Laterality Date  . COLONOSCOPY     2012-normal , Dr Sharlett Iles  . ESOPHAGOGASTRODUODENOSCOPY     normal-Dr Patterson 2012  . LOOP RECORDER INSERTION N/A 05/30/2017   Procedure: Loop Recorder Insertion;  Surgeon: Constance Haw, MD;  Location: [place] [facility];  Service: Cardiovascular;  Laterality: N/A;  . LOOP RECORDER REMOVAL N/A 03/04/2018   Procedure: LOOP RECORDER REMOVAL;  Surgeon: Constance Haw, MD;  Location: [place] [facility];  Service: Cardiovascular;  Laterality: N/A;  . MYRINGOTOMY WITH TUBE PLACEMENT    . MYRINGOTOMY WITH TUBE PLACEMENT Right 11/05/2017   Procedure: MYRINGOTOMY WITH TUBE PLACEMENT;  Surgeon:  Melissa Montane, MD;  Location: [facility];  Service: ENT;  Laterality: Right;  right T Tube placement  . NASAL SINUS SURGERY    . TEE WITHOUT CARDIOVERSION N/A 05/30/2017   Procedure: TRANSESOPHAGEAL ECHOCARDIOGRAM (TEE);  Surgeon: Thayer Headings, MD;  Location[facility] ENDOSCOPY;  Service: Cardiovascular;  Laterality: N/A;     A IV Location/Drains/Wounds Patient Lines/Drains/Airways  Status   Active Line/Drains/Airways    Name:   Placement date:   Placement time:   Site:   Days:   Peripheral IV 10/11/19 Right Forearm   10/11/19    2338    Forearm   1   Myringotomy Tube   11/05/17    0750    Right Ear   706          Intake/Output Last 24 hours No intake or output data in the 24 hours ending 10/12/19 0248  Labs/Imaging Results for orders placed or performed during the hospital encounter of 10/11/19 (from the past 48 hour(s))  Comprehensive metabolic panel     Status: Abnormal   Collection Time: 10/11/19 11:45 PM  Result Value Ref Range   Sodium 138 135 - 145 mmol/L   Potassium 4.2 3.5 - 5.1 mmol/L   Chloride 104 98 - 111 mmol/L   CO2 26 22 - 32 mmol/L   Glucose, Bld 122 (H) 70 - 99 mg/dL   BUN 21 (H) 6 - 20 mg/dL   Creatinine, Ser 1.52 (H) 0.44 - 1.00 mg/dL   Calcium 8.5 (L) 8.9 - 10.3 mg/dL   Total Protein 6.9 6.5 - 8.1 g/dL   Albumin 3.3 (L) 3.5 - 5.0 g/dL   AST 17 15 - 41 U/L   ALT 12 0 - 44 U/L   Alkaline Phosphatase 75 38 - 126 U/L   Total Bilirubin 0.8 0.3 - 1.2 mg/dL   GFR calc non Af Amer 40 (L) >60 mL/min   GFR calc Af Amer 46 (L) >60 mL/min   Anion gap 8 5 - 15    Comment: Performed at [facility], [address], [place], [place] [address]  CBC with Differential     Status: Abnormal   Collection Time: 10/11/19 11:45 PM  Result Value Ref Range   WBC 8.2 4.0 - 10.5 K/uL   RBC 3.61 (L) 3.87 - 5.11 MIL/uL   Hemoglobin 10.4 (L) 12.0 - 15.0 g/dL   HCT 33.2 (L) 36.0 - 46.0 %   MCV 92.0 80.0 - 100.0 fL   MCH 28.8 26.0 - 34.0 pg   MCHC 31.3 30.0 - 36.0 g/dL   RDW 14.6 11.5 - 15.5 %   Platelets 350 150 - 400 K/uL   nRBC 0.0 0.0 - 0.2 %   Neutrophils Relative % 59 %   Neutro Abs 4.8 1.7 - 7.7 K/uL   Lymphocytes Relative 33 %   Lymphs Abs 2.7 0.7 - 4.0 K/uL   Monocytes Relative 6 %   Monocytes Absolute 0.5 0.1 - 1.0 K/uL   Eosinophils Relative 2 %   Eosinophils Absolute 0.2 0.0 - 0.5 K/uL   Basophils Relative 0 %   Basophils Absolute  0.0 0.0 - 0.1 K/uL   Immature Granulocytes 0 %   Abs Immature Granulocytes 0.03 0.00 - 0.07 K/uL    Comment: Performed at [facility] [address] [place], [place] [address]   Mr Brain 30 Contrast  Result Date: 10/12/2019 CLINICAL DATA:  Initial evaluation for acute right lower extremity weakness, fall. EXAM: MRI  HEAD WITHOUT CONTRAST TECHNIQUE: Multiplanar, multiecho pulse sequences of the brain and surrounding structures were obtained without intravenous contrast. COMPARISON:  Prior head CT from 06/30/2019. FINDINGS: Brain: Examination mildly degraded by motion artifact. Cerebral volume within normal limits for age. Patchy T2/FLAIR hyperintensity within the periventricular and deep white matter both cerebral hemispheres noted, nonspecific, but most like related chronic microvascular ischemic disease, mild in nature. Patchy focus of restricted diffusion measuring approximately 12 mm seen involving the cortical and subcortical aspect of the parasagittal posterior left frontal lobe, left ACA distribution (series 9, images 81, 83). No associated hemorrhage or mass effect. No other evidence for acute or subacute ischemia. Gray-white matter differentiation otherwise maintained. No other areas of remote cortical infarction. No foci of susceptibility artifact to suggest acute or chronic intracranial hemorrhage. No mass lesion, midline shift or mass effect. No hydrocephalus. No extra-axial fluid collection. Pituitary gland suprasellar region normal. Midline structures intact. Vascular: Major intracranial vascular flow voids are maintained. Skull and upper cervical spine: Craniocervical junction normal. Upper cervical spine within normal limits. Diffusely decreased T1 signal intensity seen throughout the visualized bone marrow, nonspecific, but most commonly related to anemia, smoking, or obesity. No discrete osseous lesions. Scalp soft tissues within normal limits. Sinuses/Orbits: Globes and orbital soft  tissues within normal limits. Paranasal sinuses are clear. Small bilateral mastoid effusions noted, of doubtful significance. Visualized nasopharynx within normal limits. Inner ear structures grossly normal. Other: None. IMPRESSION: 1. 12 mm acute ischemic nonhemorrhagic posterior left frontal lobe infarct, left ACA distribution. No associated hemorrhage or mass effect. 2. Underlying mild chronic microvascular ischemic disease. Electronically Signed   By: Jeannine Boga M.D.   On: 10/12/2019 01:13   [name]  Result Date: 10/12/2019 CLINICAL DATA:  Initial evaluation for acute right lower extremity weakness. EXAM: MRI LUMBAR SPINE WITHOUT CONTRAST TECHNIQUE: Multiplanar, multisequence MR imaging of the lumbar spine was performed. No intravenous contrast was administered. COMPARISON:  None. FINDINGS: Segmentation: Standard. Lowest well-formed disc space labeled the L5-S1 level. Alignment: Physiologic with preservation of the normal lumbar lordosis. No listhesis. Vertebrae: Vertebral body height maintained without evidence for acute or chronic fracture. Bone marrow signal intensity diffusely decreased on T1 weighted imaging, nonspecific, but most commonly related to anemia, smoking, or obesity. Few scattered benign hemangiomata noted. No other discrete or worrisome osseous lesions. No abnormal marrow edema. Conus medullaris and cauda equina: Conus extends to the L1-2 level. Conus and cauda equina appear normal. Paraspinal and other soft tissues: Paraspinous soft tissues within normal limits. Visualized visceral structures are unremarkable. Disc levels: L1-2:  Unremarkable. L2-3:  Unremarkable. L3-4: Normal interspace. Mild bilateral facet hypertrophy. No significant stenosis. L4-5: Minimal annular disc bulge with disc desiccation. Moderate bilateral facet hypertrophy. Associated trace joint effusion on the left. No significant spinal stenosis. Mild right L4 foraminal narrowing. L5-S1:  Minimal annular disc bulge with disc desiccation. Moderate left greater than right facet hypertrophy. Associated small bilateral joint effusions. Epidural lipomatosis. No significant spinal stenosis. Mild left L5 foraminal narrowing. IMPRESSION: 1. No acute abnormality within the lumbar spine. No significant spinal stenosis or evidence for neural impingement. 2. Moderate facet arthropathy at L4-5 and L5-S1 with resultant mild right L4 and left L5 foraminal stenosis. 3. Diffusely decreased T1 marrow signal intensity, nonspecific, but most commonly related to anemia, smoking, or obesity. Electronically Signed   By: Jeannine Boga M.D.   On: 10/12/2019 01:19    Pending Labs [employer] (From admission, onward)    Start  Ordered   10/12/19 0500  Hemoglobin A1c  Tomorrow morning,   R     10/12/19 0233   10/12/19 0500  Lipid panel  Tomorrow morning,   R    Comments: Fasting    10/12/19 0233   10/12/19 0223  Protime-INR  ONCE - STAT,   STAT     10/12/19 0222   10/12/19 0201  SARS CORONAVIRUS 2 (TAT 6-24 HRS) Nasopharyngeal Nasopharyngeal Swab  (Asymptomatic/Tier 2 Patients Labs)  Once,   STAT    Question Answer Comment  Is this test for diagnosis or screening Screening   Symptomatic for COVID-19 as defined by CDC No   Hospitalized for COVID-19 No   Admitted to ICU for COVID-19 No   Previously tested for COVID-19 Yes   Resident in a congregate (group) care setting No   Employed in healthcare setting No   Pregnant No      10/12/19 0200          Vitals/Pain Today's Vitals   10/12/19 0000 10/12/19 0015 10/12/19 0145 10/12/19 0200  BP: (!) 137/94 118/81 130/77 130/84  Pulse: (!) 58 64 (!) 53 (!) 56  Resp: (!) 24 16 (!) 23 (!) 24  Temp:      TempSrc:      SpO2: 99% 100% 100% 100%  Weight:      Height:      PainSc:        Isolation Precautions No active isolations  Medications Medications  atorvastatin (LIPITOR) tablet 40 mg (has no administration in time range)   ARIPiprazole (ABILIFY) tablet 10 mg (has no administration in time range)  lamoTRIgine (LAMICTAL) tablet 75 mg (has no administration in time range)  Vitamin D (Ergocalciferol) (DRISDOL) capsule 50,000 Units (has no administration in time range)   stroke: mapping our early stages of recovery book (has no administration in time range)  acetaminophen (TYLENOL) tablet 650 mg (has no administration in time range)    Or  acetaminophen (TYLENOL) 160 MG/5ML solution 650 mg (has no administration in time range)    Or  acetaminophen (TYLENOL) suppository 650 mg (has no administration in time range)  senna-docusate (Senokot-S) tablet 1 tablet (has no administration in time range)  enoxaparin (LOVENOX) injection 40 mg (has no administration in time range)  insulin aspart (novoLOG) injection 0-9 Units (has no administration in time range)  insulin aspart (novoLOG) injection 0-5 Units (has no administration in time range)  aspirin tablet 325 mg (has no administration in time range)  aspirin chewable tablet 324 mg (324 mg Oral Given 10/12/19 0234)    Mobility walks Low fall risk   Focused Assessments Neuro Assessment Handoff:  Swallow screen pass? Yes    NIH Stroke Scale ( + Modified Stroke Scale Criteria)  LOC Questions (1b. )   +: Answers both questions correctly LOC Commands (1c. )   + : Performs both tasks correctly Best Gaze (2. )  +: Normal Visual (3. )  +: No visual loss Motor Arm, Left (5a. )   +: No drift Motor Arm, Right (5b. )   +: No drift Motor Leg, Left (6a. )   +: No drift Motor Leg, Right (6b. )   +: No effort against gravity Sensory (8. )   +: Mild-to-moderate sensory loss, patient feels pinprick is less sharp or is dull on the affected side, or there is a loss of superficial pain with pinprick, but patient is aware of being touched(right leg "feels different") Best Language (9. )   +:  No aphasia Extinction/Inattention (11.)   +: No Abnormality Modified SS Total  +: 4      Neuro Assessment: Exceptions to WDL Neuro Checks:      Last Documented NIHSS Modified Score: 4 (10/12/19 0001) Has TPA been given? No If patient is a Neuro Trauma and patient is going to OR before floor call report to [place] nurse: [phone] or [phone]     R Recommendations: See Admitting Provider Note  Report given to:   Additional Notes: Pt alert and oriented.  Right leg weakness only complaint.

## 2019-10-13 ENCOUNTER — Other Ambulatory Visit: Payer: Self-pay

## 2019-10-13 ENCOUNTER — Inpatient Hospital Stay (HOSPITAL_COMMUNITY): Payer: Medicare HMO

## 2019-10-13 DIAGNOSIS — R569 Unspecified convulsions: Secondary | ICD-10-CM

## 2019-10-13 LAB — GLUCOSE, CAPILLARY
Glucose-Capillary: 115 mg/dL — ABNORMAL HIGH (ref 70–99)
Glucose-Capillary: 109 mg/dL — ABNORMAL HIGH (ref 70–99)
Glucose-Capillary: 165 mg/dL — ABNORMAL HIGH (ref 70–99)
Glucose-Capillary: 107 mg/dL — ABNORMAL HIGH (ref 70–99)

## 2019-10-13 LAB — BASIC METABOLIC PANEL
Anion gap: 9 (ref 5–15)
BUN: 20 mg/dL (ref 6–20)
CO2: 25 mmol/L (ref 22–32)
Calcium: 8.3 mg/dL — ABNORMAL LOW (ref 8.9–10.3)
Chloride: 103 mmol/L (ref 98–111)
Creatinine, Ser: 1.29 mg/dL — ABNORMAL HIGH (ref 0.44–1.00)
GFR calc Af Amer: 56 mL/min — ABNORMAL LOW (ref >60)
GFR calc non Af Amer: 48 mL/min — ABNORMAL LOW (ref >60)
Glucose, Bld: 132 mg/dL — ABNORMAL HIGH (ref 70–99)
Potassium: 4 mmol/L (ref 3.5–5.1)
Sodium: 137 mmol/L (ref 135–145)

## 2019-10-13 LAB — MAGNESIUM: Magnesium: 1.8 mg/dL (ref 1.7–2.4)

## 2019-10-13 LAB — PHOSPHORUS: Phosphorus: 4.1 mg/dL (ref 2.5–4.6)

## 2019-10-13 MED ORDER — LEVETIRACETAM IN NACL 1000 MG/100ML IV SOLN: 1000.0000 mg | Freq: Two times a day (BID) | INTRAVENOUS | Status: DC

## 2019-10-13 MED ORDER — LEVETIRACETAM IN NACL 1000 MG/100ML IV SOLN
1000.0000 mg | Freq: Once | INTRAVENOUS | Status: AC
  Administered 2019-10-13: 1000 mg via INTRAVENOUS
  Filled 2019-10-13: qty 100

## 2019-10-13 MED ORDER — LAMOTRIGINE 100 MG PO TABS
100.0000 mg | ORAL_TABLET | Freq: Two times a day (BID) | ORAL | Status: DC
  Administered 2019-10-13 – 2019-10-15 (×5): 100 mg via ORAL
  Filled 2019-10-13 (×5): qty 1

## 2019-10-13 MED ORDER — MAGNESIUM SULFATE IN D5W 1-5 GM/100ML-% IV SOLN
1.0000 g | Freq: Once | INTRAVENOUS | Status: AC
  Administered 2019-10-13: 1 g via INTRAVENOUS
  Filled 2019-10-13: qty 100

## 2019-10-13 NOTE — Procedures (Signed)
Patient Name: Brandi Gomez  MRN: [id number]  Epilepsy Attending: Lora Havens  Referring Physician/Provider: Dr Rosalin Hawking Date: 10/13/2019 Duration: 23.41 mins  Patient history: 48 year old female with left ACA stroke and known partial complex epilepsy who had breakthrough seizures. EEG to evaluate for seizures.  Level of alertness: awake, asleep  AEDs during EEG study: LTG  Technical aspects: This EEG study was done with scalp electrodes positioned according to the 10-20 International system of electrode placement. Electrical activity was acquired at a sampling rate of 500Hz  and reviewed with a high frequency filter of 70Hz  and a low frequency filter of 1Hz . EEG data were recorded continuously and digitally stored.   DESCRIPTION: During awake state, the posterior dominant rhythm consists of 8-9 Hz activity of moderate voltage (25-35 uV) seen predominantly in posterior head regions, symmetric and reactive to eye opening and eye closing. Sleep was characterized sleep spindles (12-14Hz ) and 3-5hz  diffuse slowing. EEG also showed continuous generalized 4-6hz  theta-delta slowing admixed with an excessive amount of 15 to 18 Hz, 2-3 uV beta activity with irregular morphology distributed symmetrically and diffusely.  Hyperventilation and photic stimulation were not performed.  Abnormality -Continuous slow, generalized -Excessive beta, generalized  IMPRESSION: This study is suggestive of mild diffuse encephalopathy, nonspecific to etiology. No seizures or epileptiform discharges were seen throughout the recording.  The excessive beta activity seen in the background is most likely due to the effect of benzodiazepine and is a benign EEG pattern.  [name]

## 2019-10-13 NOTE — Progress Notes (Addendum)
Inpatient Rehabilitation Admissions Coordinator  Inpatient rehab consult received.Pt sleeping on CPAP with continuous EEG. I left voicemail for her daughter to contact me to discuss dispo. I will follow up tomorrow.  Danne Baxter, RN, MSN Rehab Admissions Coordinator [phone] 10/13/2019 4:06 PM   I spoke with daughter by phone and she states Mom's boyfriend able to provide 24/7 assist at home. I will follow pt's progress over the next 24 hrs to determine if she will need a possible [facility] admit before d/c. I did review visiting guidelines of one visitor throughout the hospital stay. Daughter states she, Raford Pitcher and pt's boyfriend [name] have been trading out one visitor at a  Time.  Danne Baxter, RN, MSN Rehab Admissions Coordinator [phone] 10/13/2019 4:43 PM

## 2019-10-13 NOTE — Progress Notes (Signed)
The patient has had 2 breakthrough seizures tonight followed by postictal confusion. Activity involves head shaking and bilateral hands as well as eyes rolling back in head. She has had her Lamictal dose.   A/R: 48 year old female with left ACA stroke and known partial complex epilepsy. Has had 2 breatkthrough seizures this evening.  1. Administering a load of Keppra 1000 mg x 1 now. Will hold off on scheduled dosing for now.  2. Obtaining Lamictal level. If low, will need to increase total daily dose.  3. If Lamictal level is therapeutic, will need to start scheduled Keppra.   Electronically signed: Dr. Kerney Elbe

## 2019-10-13 NOTE — Progress Notes (Signed)
vLTM EEG started following spot EEG. Notified Neuro 

## 2019-10-13 NOTE — Progress Notes (Signed)
STROKE TEAM PROGRESS NOTE   INTERVAL HISTORY Pt overnight had 2 breakthrough seizures with head shaking and drooling, not following commands. No limb shaking documented. HR up to 150s and then down to 50s post seizure. Pt had no recollection of the events. Received keppra load. Will increase her lamictal and put on LTM EEG.   Vitals:   10/13/19 0840 10/13/19 0841 10/13/19 1110 10/13/19 1121  BP: 120/71  103/64   Pulse:      Resp: 15  16   Temp:  99 F (37.2 C)  99.3 F (37.4 C)  TempSrc:  Oral  Oral  SpO2: 100%  100%   Weight:      Height:        CBC:  Recent Labs  Lab 10/11/19 2345  WBC 8.2  NEUTROABS 4.8  HGB 10.4*  HCT 33.2*  MCV 92.0  PLT [id number]    Basic Metabolic Panel:  Recent Labs  Lab 10/11/19 2345 10/13/19 0029  NA 138 137  K 4.2 4.0  CL 104 103  CO2 26 25  GLUCOSE 122* 132*  BUN 21* 20  CREATININE 1.52* 1.29*  CALCIUM 8.5* 8.3*  MG  --  1.8  PHOS  --  4.1   Lipid Panel:     Component Value Date/Time   CHOL 123 10/12/2019 0319   TRIG 49 10/12/2019 0319   HDL 49 10/12/2019 0319   CHOLHDL 2.5 10/12/2019 0319   VLDL 10 10/12/2019 0319   LDLCALC 64 10/12/2019 0319   HgbA1c:  Lab Results  Component Value Date   HGBA1C 6.5 (H) 10/12/2019   Urine Drug Screen:     Component Value Date/Time   LABOPIA NONE DETECTED 10/12/2019 1745   COCAINSCRNUR NONE DETECTED 10/12/2019 1745   LABBENZ NONE DETECTED 10/12/2019 1745   AMPHETMU NONE DETECTED 10/12/2019 1745   THCU NONE DETECTED 10/12/2019 1745   LABBARB NONE DETECTED 10/12/2019 1745    Alcohol Level     Component Value Date/Time   ETH <10 07/14/2019 1511    IMAGING Mr Angio Head Wo Contrast  Result Date: 10/12/2019 CLINICAL DATA:  Stroke, follow-up. EXAM: MRA HEAD WITHOUT CONTRAST TECHNIQUE: Angiographic images of the Circle of Willis were obtained using MRA technique without intravenous contrast. COMPARISON:  Brain MRI 10/12/2019, MRA head 05/21/2017 FINDINGS: The intracranial internal  carotid arteries are patent without significant stenosis. The right middle cerebral artery is patent without significant proximal stenosis. The right anterior cerebral artery is patent. Apparent mild focal stenosis within the non dominant right A1 segment (series 252, image 3). The left middle and anterior cerebral arteries are patent without significant proximal stenosis. No intracranial aneurysm is identified. The intracranial vertebral arteries are patent without significant stenosis, as is the basilar artery. The bilateral posterior cerebral arteries are patent without significant proximal stenosis. IMPRESSION: No intracranial large vessel occlusion or proximal high-grade arterial stenosis. Mild focal stenosis within the non dominant A1 right anterior cerebral artery. Electronically Signed   By: Kellie Simmering DO   On: 10/12/2019 12:31   [name]  Result Date: 10/12/2019 CLINICAL DATA:  Initial evaluation for acute right lower extremity weakness, fall. EXAM: MRI HEAD WITHOUT CONTRAST TECHNIQUE: Multiplanar, multiecho pulse sequences of the brain and surrounding structures were obtained without intravenous contrast. COMPARISON:  Prior head CT from 06/30/2019. FINDINGS: Brain: Examination mildly degraded by motion artifact. Cerebral volume within normal limits for age. Patchy T2/FLAIR hyperintensity within the periventricular and deep white matter both cerebral hemispheres noted, nonspecific, but most  like related chronic microvascular ischemic disease, mild in nature. Patchy focus of restricted diffusion measuring approximately 12 mm seen involving the cortical and subcortical aspect of the parasagittal posterior left frontal lobe, left ACA distribution (series 9, images 81, 83). No associated hemorrhage or mass effect. No other evidence for acute or subacute ischemia. Gray-white matter differentiation otherwise maintained. No other areas of remote cortical infarction. No foci of susceptibility  artifact to suggest acute or chronic intracranial hemorrhage. No mass lesion, midline shift or mass effect. No hydrocephalus. No extra-axial fluid collection. Pituitary gland suprasellar region normal. Midline structures intact. Vascular: Major intracranial vascular flow voids are maintained. Skull and upper cervical spine: Craniocervical junction normal. Upper cervical spine within normal limits. Diffusely decreased T1 signal intensity seen throughout the visualized bone marrow, nonspecific, but most commonly related to anemia, smoking, or obesity. No discrete osseous lesions. Scalp soft tissues within normal limits. Sinuses/Orbits: Globes and orbital soft tissues within normal limits. Paranasal sinuses are clear. Small bilateral mastoid effusions noted, of doubtful significance. Visualized nasopharynx within normal limits. Inner ear structures grossly normal. Other: None. IMPRESSION: 1. 12 mm acute ischemic nonhemorrhagic posterior left frontal lobe infarct, left ACA distribution. No associated hemorrhage or mass effect. 2. Underlying mild chronic microvascular ischemic disease. Electronically Signed   By: Jeannine Boga M.D.   On: 10/12/2019 01:13   [name]  Result Date: 10/12/2019 CLINICAL DATA:  Initial evaluation for acute right lower extremity weakness. EXAM: MRI LUMBAR SPINE WITHOUT CONTRAST TECHNIQUE: Multiplanar, multisequence MR imaging of the lumbar spine was performed. No intravenous contrast was administered. COMPARISON:  None. FINDINGS: Segmentation: Standard. Lowest well-formed disc space labeled the L5-S1 level. Alignment: Physiologic with preservation of the normal lumbar lordosis. No listhesis. Vertebrae: Vertebral body height maintained without evidence for acute or chronic fracture. Bone marrow signal intensity diffusely decreased on T1 weighted imaging, nonspecific, but most commonly related to anemia, smoking, or obesity. Few scattered benign hemangiomata noted.  No other discrete or worrisome osseous lesions. No abnormal marrow edema. Conus medullaris and cauda equina: Conus extends to the L1-2 level. Conus and cauda equina appear normal. Paraspinal and other soft tissues: Paraspinous soft tissues within normal limits. Visualized visceral structures are unremarkable. Disc levels: L1-2:  Unremarkable. L2-3:  Unremarkable. L3-4: Normal interspace. Mild bilateral facet hypertrophy. No significant stenosis. L4-5: Minimal annular disc bulge with disc desiccation. Moderate bilateral facet hypertrophy. Associated trace joint effusion on the left. No significant spinal stenosis. Mild right L4 foraminal narrowing. L5-S1: Minimal annular disc bulge with disc desiccation. Moderate left greater than right facet hypertrophy. Associated small bilateral joint effusions. Epidural lipomatosis. No significant spinal stenosis. Mild left L5 foraminal narrowing. IMPRESSION: 1. No acute abnormality within the lumbar spine. No significant spinal stenosis or evidence for neural impingement. 2. Moderate facet arthropathy at L4-5 and L5-S1 with resultant mild right L4 and left L5 foraminal stenosis. 3. Diffusely decreased T1 marrow signal intensity, nonspecific, but most commonly related to anemia, smoking, or obesity. Electronically Signed   By: Jeannine Boga M.D.   On: 10/12/2019 01:19   Vas US Carotid (at [place] Only)  Result Date: 10/12/2019 Carotid Arterial Duplex Study Indications:       CVA. Risk Factors:      Hypertension, hyperlipidemia, Diabetes, no history of                    smoking. Comparison Study:  Previous 05/21/2017 bilateral 1-39% stenosis. Performing Technologist: Estill Batten Mackin : RVT, RDCS (AE), RDMS  Examination Guidelines: A complete evaluation includes B-mode imaging, spectral Doppler, color Doppler, and power Doppler as needed of all accessible portions of each vessel. Bilateral testing is considered an integral part of a complete examination. Limited  examinations for reoccurring indications may be performed as noted.  Right Carotid Findings: +----------+--------+--------+--------+------------------+--------+           PSV cm/sEDV cm/sStenosisPlaque DescriptionComments +----------+--------+--------+--------+------------------+--------+ CCA Prox  103     16                                         +----------+--------+--------+--------+------------------+--------+ CCA Distal71      16                                         +----------+--------+--------+--------+------------------+--------+ ICA Prox  63      19      1-39%                              +----------+--------+--------+--------+------------------+--------+ ICA Distal54      12                                         +----------+--------+--------+--------+------------------+--------+ ECA       97      15                                         +----------+--------+--------+--------+------------------+--------+ +----------+--------+-------+----------------+-------------------+           PSV cm/sEDV cmsDescribe        Arm Pressure (mmHG) +----------+--------+-------+----------------+-------------------+ [id number]            Multiphasic, WNL                    +----------+--------+-------+----------------+-------------------+ +---------+--------+--+--------+--+---------+ VertebralPSV cm/s55EDV cm/s19Antegrade +---------+--------+--+--------+--+---------+  Left Carotid Findings: +----------+--------+--------+--------+------------------+--------+           PSV cm/sEDV cm/sStenosisPlaque DescriptionComments +----------+--------+--------+--------+------------------+--------+ CCA Prox  114     24                                         +----------+--------+--------+--------+------------------+--------+ CCA Distal76      17                                         +----------+--------+--------+--------+------------------+--------+  ICA Prox  60      12      1-39%                              +----------+--------+--------+--------+------------------+--------+ ICA Distal78      21                                         +----------+--------+--------+--------+------------------+--------+ ECA  84      15                                         +----------+--------+--------+--------+------------------+--------+ +----------+--------+--------+------------+-------------------+           PSV cm/sEDV cm/sDescribe    Arm Pressure (mmHG) +----------+--------+--------+------------+-------------------+ Subclavian                Not assessed                    +----------+--------+--------+------------+-------------------+ +---------+--------+--+--------+--+---------+ VertebralPSV cm/s44EDV cm/s15Antegrade +---------+--------+--+--------+--+---------+  Summary: Right Carotid: Velocities in the right ICA are consistent with a 1-39% stenosis. Left Carotid: Velocities in the left ICA are consistent with a 1-39% stenosis. Vertebrals:  Bilateral vertebral arteries demonstrate antegrade flow. Subclavians: Normal flow hemodynamics were seen in the right subclavian artery.              Left subclavian artery not assessed. *See table(s) above for measurements and observations.  Electronically signed by Harold Barban MD on 10/12/2019 at 5:39:15 PM.    Final     PHYSICAL EXAM  Temp:  [98.3 F (36.8 C)-99.3 F (37.4 C)] 99.3 F (37.4 C) (11/04 1121) Pulse Rate:  [62-85] 62 (11/04 0325) Resp:  [15-20] 16 (11/04 1110) BP: (103-169)/(63-139) 103/64 (11/04 1110) SpO2:  [100 %] 100 % (11/04 1110)  General - Well nourished, well developed, in no apparent distress.  Ophthalmologic - fundi not visualized due to noncooperation.  Cardiovascular - Regular rhythm and rate.  Mental Status -  Level of arousal and orientation to time, place, and person were intact. Language including expression, naming, repetition,  comprehension was assessed and found intact.  Cranial Nerves II - XII - II - Visual field intact OU. III, IV, VI - Extraocular movements intact. V - Facial sensation intact bilaterally. VII - Facial movement intact bilaterally. VIII - Hearing & vestibular intact bilaterally. X - Palate elevates symmetrically. XI - Chin turning & shoulder shrug intact bilaterally. XII - Tongue protrusion intact.  Motor Strength - The patient's strength was normal in all extremities except RLE 5-/5 and pronator drift was absent.  Bulk was normal and fasciculations were absent.   Motor Tone - Muscle tone was assessed at the neck and appendages and was normal.  Reflexes - The patient's reflexes were symmetrical in all extremities and she had no pathological reflexes.  Sensory - Light touch, temperature/pinprick were assessed and were symmetrical.    Coordination - The patient had normal movements in the hands with no ataxia or dysmetria.  Tremor was absent.  Gait and Station - deferred.   ASSESSMENT/PLAN [name] is a 48 y.o. female with history of prior cryptogenic stroke, DB, HTN, morbid obesity, partial complex sz and OSA presenting after a fall at home with RLE weakness, R leg numbness.   Stroke:   L ACA territory small patchy infarct, embolic pattern, could be secondary to cardiomyopathy  MRI  Posterior L frontal lobe infarct L ACA territory. Small vessel disease.   MRA  unremarkable  Carotid Doppler  B ICA 1-39% stenosis, VAs antegrade   2D Echo EF 35-40%  LDL 64  HgbA1c 6.5  Lovenox 40 mg sq daily for VTE prophylaxis  No antithrombotic prior to admission, now on aspirin 81 mg daily and clopidogrel 75 mg daily. Continue DAPT at d/c x 3 weeks then aspirin alone   Therapy  recommendations:  CIR  Disposition:  pending   Hx stroke/TIA  05/2017 presenting w/ LUE numbness.  MRI showed R perirolandic small infarct likely embolic source.  MRA unremarkable, LDL 82, A1c 7.4.   Carotid Doppler negative.  EF 40 to 45%. Discharged with aspirin and Lipitor 10. Had outpatient TEE unremarkable, no PFO.  Loop recorder placed 05/30/2017, no A. fib found, and removed 03/04/2018 at pt request (due to shocking sensation as per patient).    Follows at [facility], no antiplatelet PTA  Cardiomyopathy  05/2017 EF 40 to 45%  Current admission EF 35 to 40%  Patient to strokes in 2018 and this admission all consistent with embolic source.  Loop recorder negative for A. fib.  Concerning for cardiomyopathy as the source of emboli  Patient has no cardiology follow-up as outpatient  Cardiology consulted for CHF management. Needs ischemic workup as OP.  Hypertension  Stable . Permissive hypertension (OK if < 220/120) but gradually normalize in 5-7 days . Long-term BP goal normotensive  Hyperlipidemia  Home meds:  lipitor 10  Now on lipitor 40  LDL 64, goal < 70  Continue statin at discharge  Diabetes type II Controlled  HgbA1c 6.5, goal < 7.0  CBGs  SSI   PCP follow-up  Partial complex sz d/o w/ intractable epilepsy  Followed by Dr. Jannifer [facility]  Has been on multiple AEDs in the past, but not tolerating  on Lamictal 75 bid PTA  Breakthrough sz x 2 during the night  Loaded with Keppra 1000  Lamictal level pending    EEG without sz, diffuse encephalopathy  On LTM EEG  Increased lamictal to 100 bid  Not tolerating keppra, tegretol or vimpat in the past  Continue Lamictal and follow-up with Dr. Jannifer Franklin at [facility]  Other Stroke Risk Factors  Morbid Obesity, Body mass index is 49.13 kg/m., recommend weight loss, diet and exercise as appropriate   Migraines - followed by Dr. Jannifer Franklin  Obstructive sleep apnea, on CPAP Qhs   Other Active Problems  Bipolar 1 d/o on Abilify and lamotrigine  Mild mental retardation  Depression on Abilify  CKD stage 3, creatinine 1.52  Hospital day # 1  Rosalin Hawking, MD PhD Stroke Neurology 10/13/2019 12:19 PM    To  contact Stroke Continuity provider, please refer to [email or web address] After hours, contact General Neurology

## 2019-10-13 NOTE — Progress Notes (Signed)
Progress Note  Patient Name: Brandi Gomez Date of Encounter: 10/13/2019  Primary Cardiologist: Skeet Latch, MD   Subjective   Feeling well. Doesn't remember her seizures. Notes that there is a medication she was supposed be taking 3 times daily.  Her daughter helps her with her medications and accidentally was only giving it to her once a day.  She is unsure what this medication was.  On review of her medication list on see anything that was written for 3 times daily.  It does appear that lamotrigine was ordered 3 tablets twice daily.  Is possible that she was not receiving the correct dose prior to admission.  Inpatient Medications    Scheduled Meds:  ARIPiprazole  10 mg Oral Daily   aspirin EC  81 mg Oral Daily   atorvastatin  40 mg Oral q1800   clopidogrel  75 mg Oral Daily   enoxaparin (LOVENOX) injection  40 mg Subcutaneous Q24H   insulin aspart  0-5 Units Subcutaneous QHS   insulin aspart  0-9 Units Subcutaneous TID WC   lamoTRIgine  100 mg Oral BID   Vitamin D (Ergocalciferol)  50,000 Units Oral Q Tue   Continuous Infusions:  PRN Meds: acetaminophen **OR** acetaminophen (TYLENOL) oral liquid 160 mg/5 mL **OR** acetaminophen, senna-docusate   Vital Signs    Vitals:   10/12/19 2356 10/12/19 2359 10/13/19 0325 10/13/19 0841  BP: (!) 169/139 (!) 160/72 105/63   Pulse: 85 64 62   Resp: 19  17   Temp: 98.3 F (36.8 C)  98.5 F (36.9 C) 99 F (37.2 C)  TempSrc: Oral  Oral Oral  SpO2: 100%  100%   Weight:      Height:        Intake/Output Summary (Last 24 hours) at 10/13/2019 1104 Last data filed at 10/13/2019 0600 Gross per 24 hour  Intake 138.25 ml  Output --  Net 138.25 ml   Last 3 Weights 10/11/2019 08/28/2019 08/09/2019  Weight (lbs) 260 lb 275 lb 9.2 oz 274 lb 6.4 oz  Weight (kg) 117.935 kg 125 kg 124.467 kg  Some encounter information is confidential and restricted. Go to Review Flowsheets activity to see all data.      Telemetry     Sinus rhythm, sinus tachycardia.  - Personally Reviewed  ECG    n/a - Personally Reviewed  Physical Exam   VS:  BP 105/63 (BP Location: Left Wrist)    Pulse 62    Temp 99 F (37.2 C) (Oral)    Resp 17    Ht 5\' 1"  (1.549 m)    Wt 117.9 kg    SpO2 100%    BMI 49.13 kg/m  , BMI Body mass index is 49.13 kg/m. GENERAL:  Well appearing.  No acute distress.  Morbidly obese HEENT: Pupils equal round and reactive, fundi not visualized, oral mucosa unremarkable NECK:  No jugular venous distention, waveform within normal limits, carotid upstroke brisk and symmetric, no bruits LUNGS:  Clear to auscultation bilaterally HEART:  RRR.  PMI not displaced or sustained,S1 and S2 within normal limits, no S3, no S4, no clicks, no rubs, no murmurs ABD:  Flat, positive bowel sounds normal in frequency in pitch, no bruits, no rebound, no guarding, no midline pulsatile mass, no hepatomegaly, no splenomegaly EXT:  2 plus pulses throughout, no edema, no cyanosis no clubbing SKIN:  No rashes no nodules NEURO:  Cranial nerves II through XII grossly intact, motor grossly intact throughout PSYCH:  Cognitively intact,  oriented to person place and time.  Flat affect  Labs    High Sensitivity Troponin:  No results for input(s): TROPONINIHS in the last 720 hours.    Chemistry Recent Labs  Lab 10/11/19 2345 10/13/19 0029  NA 138 137  K 4.2 4.0  CL 104 103  CO2 26 25  GLUCOSE 122* 132*  BUN 21* 20  CREATININE 1.52* 1.29*  CALCIUM 8.5* 8.3*  PROT 6.9  --   ALBUMIN 3.3*  --   AST 17  --   ALT 12  --   ALKPHOS 75  --   BILITOT 0.8  --   GFRNONAA 40* 48*  GFRAA 46* 56*  ANIONGAP 8 9     Hematology Recent Labs  Lab 10/11/19 2345  WBC 8.2  RBC 3.61*  HGB 10.4*  HCT 33.2*  MCV 92.0  MCH 28.8  MCHC 31.3  RDW 14.6  PLT 350    BNPNo results for input(s): BNP, PROBNP in the last 168 hours.   DDimer No results for input(s): DDIMER in the last 168 hours.   Radiology    Mr Angio Head Wo  Contrast  Result Date: 10/12/2019 CLINICAL DATA:  Stroke, follow-up. EXAM: MRA HEAD WITHOUT CONTRAST TECHNIQUE: Angiographic images of the Circle of Willis were obtained using MRA technique without intravenous contrast. COMPARISON:  Brain MRI 10/12/2019, MRA head 05/21/2017 FINDINGS: The intracranial internal carotid arteries are patent without significant stenosis. The right middle cerebral artery is patent without significant proximal stenosis. The right anterior cerebral artery is patent. Apparent mild focal stenosis within the non dominant right A1 segment (series 252, image 3). The left middle and anterior cerebral arteries are patent without significant proximal stenosis. No intracranial aneurysm is identified. The intracranial vertebral arteries are patent without significant stenosis, as is the basilar artery. The bilateral posterior cerebral arteries are patent without significant proximal stenosis. IMPRESSION: No intracranial large vessel occlusion or proximal high-grade arterial stenosis. Mild focal stenosis within the non dominant A1 right anterior cerebral artery. Electronically Signed   By: Kellie Simmering DO   On: 10/12/2019 12:31   [name]  Result Date: 10/12/2019 CLINICAL DATA:  Initial evaluation for acute right lower extremity weakness, fall. EXAM: MRI HEAD WITHOUT CONTRAST TECHNIQUE: Multiplanar, multiecho pulse sequences of the brain and surrounding structures were obtained without intravenous contrast. COMPARISON:  Prior head CT from 06/30/2019. FINDINGS: Brain: Examination mildly degraded by motion artifact. Cerebral volume within normal limits for age. Patchy T2/FLAIR hyperintensity within the periventricular and deep white matter both cerebral hemispheres noted, nonspecific, but most like related chronic microvascular ischemic disease, mild in nature. Patchy focus of restricted diffusion measuring approximately 12 mm seen involving the cortical and subcortical aspect of the  parasagittal posterior left frontal lobe, left ACA distribution (series 9, images 81, 83). No associated hemorrhage or mass effect. No other evidence for acute or subacute ischemia. Gray-white matter differentiation otherwise maintained. No other areas of remote cortical infarction. No foci of susceptibility artifact to suggest acute or chronic intracranial hemorrhage. No mass lesion, midline shift or mass effect. No hydrocephalus. No extra-axial fluid collection. Pituitary gland suprasellar region normal. Midline structures intact. Vascular: Major intracranial vascular flow voids are maintained. Skull and upper cervical spine: Craniocervical junction normal. Upper cervical spine within normal limits. Diffusely decreased T1 signal intensity seen throughout the visualized bone marrow, nonspecific, but most commonly related to anemia, smoking, or obesity. No discrete osseous lesions. Scalp soft tissues within normal limits. Sinuses/Orbits: Globes and orbital soft  tissues within normal limits. Paranasal sinuses are clear. Small bilateral mastoid effusions noted, of doubtful significance. Visualized nasopharynx within normal limits. Inner ear structures grossly normal. Other: None. IMPRESSION: 1. 12 mm acute ischemic nonhemorrhagic posterior left frontal lobe infarct, left ACA distribution. No associated hemorrhage or mass effect. 2. Underlying mild chronic microvascular ischemic disease. Electronically Signed   By: Jeannine Boga M.D.   On: 10/12/2019 01:13   [name]  Result Date: 10/12/2019 CLINICAL DATA:  Initial evaluation for acute right lower extremity weakness. EXAM: MRI LUMBAR SPINE WITHOUT CONTRAST TECHNIQUE: Multiplanar, multisequence MR imaging of the lumbar spine was performed. No intravenous contrast was administered. COMPARISON:  None. FINDINGS: Segmentation: Standard. Lowest well-formed disc space labeled the L5-S1 level. Alignment: Physiologic with preservation of the normal  lumbar lordosis. No listhesis. Vertebrae: Vertebral body height maintained without evidence for acute or chronic fracture. Bone marrow signal intensity diffusely decreased on T1 weighted imaging, nonspecific, but most commonly related to anemia, smoking, or obesity. Few scattered benign hemangiomata noted. No other discrete or worrisome osseous lesions. No abnormal marrow edema. Conus medullaris and cauda equina: Conus extends to the L1-2 level. Conus and cauda equina appear normal. Paraspinal and other soft tissues: Paraspinous soft tissues within normal limits. Visualized visceral structures are unremarkable. Disc levels: L1-2:  Unremarkable. L2-3:  Unremarkable. L3-4: Normal interspace. Mild bilateral facet hypertrophy. No significant stenosis. L4-5: Minimal annular disc bulge with disc desiccation. Moderate bilateral facet hypertrophy. Associated trace joint effusion on the left. No significant spinal stenosis. Mild right L4 foraminal narrowing. L5-S1: Minimal annular disc bulge with disc desiccation. Moderate left greater than right facet hypertrophy. Associated small bilateral joint effusions. Epidural lipomatosis. No significant spinal stenosis. Mild left L5 foraminal narrowing. IMPRESSION: 1. No acute abnormality within the lumbar spine. No significant spinal stenosis or evidence for neural impingement. 2. Moderate facet arthropathy at L4-5 and L5-S1 with resultant mild right L4 and left L5 foraminal stenosis. 3. Diffusely decreased T1 marrow signal intensity, nonspecific, but most commonly related to anemia, smoking, or obesity. Electronically Signed   By: Jeannine Boga M.D.   On: 10/12/2019 01:19   Vas US Carotid (at [facility] Only)  Result Date: 10/12/2019 Carotid Arterial Duplex Study Indications:       CVA. Risk Factors:      Hypertension, hyperlipidemia, Diabetes, no history of                    smoking. Comparison Study:  Previous 05/21/2017 bilateral 1-39% stenosis. Performing  Technologist: Estill Batten Mackin : RVT, RDCS (AE), RDMS  Examination Guidelines: A complete evaluation includes B-mode imaging, spectral Doppler, color Doppler, and power Doppler as needed of all accessible portions of each vessel. Bilateral testing is considered an integral part of a complete examination. Limited examinations for reoccurring indications may be performed as noted.  Right Carotid Findings: +----------+--------+--------+--------+------------------+--------+             PSV cm/s EDV cm/s Stenosis Plaque Description Comments  +----------+--------+--------+--------+------------------+--------+  CCA Prox   103      16                                             +----------+--------+--------+--------+------------------+--------+  CCA Distal 71       16                                             +----------+--------+--------+--------+------------------+--------+  ICA Prox   63       19       1-39%                                 +----------+--------+--------+--------+------------------+--------+  ICA Distal 54       12                                             +----------+--------+--------+--------+------------------+--------+  ECA        97       15                                             +----------+--------+--------+--------+------------------+--------+ +----------+--------+-------+----------------+-------------------+             PSV cm/s EDV cms Describe         Arm Pressure (mmHG)  +----------+--------+-------+----------------+-------------------+  Subclavian 143              Multiphasic, WNL                      +----------+--------+-------+----------------+-------------------+ +---------+--------+--+--------+--+---------+  Vertebral PSV cm/s 55 EDV cm/s 19 Antegrade  +---------+--------+--+--------+--+---------+  Left Carotid Findings: +----------+--------+--------+--------+------------------+--------+             PSV cm/s EDV cm/s Stenosis Plaque Description Comments   +----------+--------+--------+--------+------------------+--------+  CCA Prox   114      24                                             +----------+--------+--------+--------+------------------+--------+  CCA Distal 76       17                                             +----------+--------+--------+--------+------------------+--------+  ICA Prox   60       12       1-39%                                 +----------+--------+--------+--------+------------------+--------+  ICA Distal 78       21                                             +----------+--------+--------+--------+------------------+--------+  ECA        84       15                                             +----------+--------+--------+--------+------------------+--------+ +----------+--------+--------+------------+-------------------+             PSV cm/s EDV cm/s Describe     Arm Pressure (mmHG)  +----------+--------+--------+------------+-------------------+  Subclavian  Not assessed                      +----------+--------+--------+------------+-------------------+ +---------+--------+--+--------+--+---------+  Vertebral PSV cm/s 44 EDV cm/s 15 Antegrade  +---------+--------+--+--------+--+---------+  Summary: Right Carotid: Velocities in the right ICA are consistent with a 1-39% stenosis. Left Carotid: Velocities in the left ICA are consistent with a 1-39% stenosis. Vertebrals:  Bilateral vertebral arteries demonstrate antegrade flow. Subclavians: Normal flow hemodynamics were seen in the right subclavian artery.              Left subclavian artery not assessed. *See table(s) above for measurements and observations.  Electronically signed by Harold Barban MD on 10/12/2019 at 5:39:15 PM.    Final     Cardiac Studies   Echo 10/12/19: IMPRESSIONS   1. Left ventricular ejection fraction, by visual estimation, is 35 to 40%. The left ventricle has moderately decreased function. There is mildly increased left ventricular  hypertrophy.  2. Left ventricular diastolic parameters are consistent with Grade II diastolic dysfunction (pseudonormalization).  3. Global right ventricle has normal systolic function.The right ventricular size is normal.  4. Left atrial size was normal.  5. Right atrial size was normal.  6. The mitral valve is normal in structure. No evidence of mitral valve regurgitation. No evidence of mitral stenosis.  7. The tricuspid valve is normal in structure. Tricuspid valve regurgitation is trivial.  8. The aortic valve is normal in structure. Aortic valve regurgitation is not visualized. No evidence of aortic valve sclerosis or stenosis.  9. The pulmonic valve was not well visualized. Pulmonic valve regurgitation is not visualized. 10. Technically difficult; moderate global reduction in LV systolic function; grade 2 diastolic dysfunction; mild LVH.  Carotid Doppler: Summary: Right Carotid: Velocities in the right ICA are consistent with a 1-39% stenosis.  Left Carotid: Velocities in the left ICA are consistent with a 1-39% stenosis.  Patient Profile     Ms. Sudds is a 76F with prior stroke, chronic systolic and diastolic heart failure, hypertension, hyperlipidemia, morbid obesity, CKD3, OSA on CPAP, and mental disability admitted with recurrent stroke.  Cardiology was consulted due to worsening systolic function.  Assessment & Plan    # Chronic systolic and diastolic heart failure:  LVEF is reduced to 35-40% this admission from 40-45% previously.  Overall this is not a very significant change, though I do concur there is a slight change on the echocardiogram.  Clinically she does not have any heart failure symptoms.  Her beta-blocker has been held due to bradycardia.  After her 2 episodes of seizure she did have bradycardia to the 40s and 50s.  Therefore, would not rechallenge with a beta-blocker at this time.  She has not been on an ACE inhibitor/ARB or ARN I.  Blood pressure remains too low  for this at this time.  If her renal function continues to improve and her blood pressure increases we will add a low-dose of losartan.  She ultimately will need an ischemic work-up.  Given her body habitus cardiac catheterization is most likely to be definitive about her coronary anatomy.  However I do not think this needs to be done acutely.  In light of her recent stroke and recurrent seizures will likely plan to do this as an outpatient.  # Recurrent stroke:  No afib/pfo noted at the time of her prior stroke.  Loop recorder removed 2/2 patient discomfort. No plan to replace.  Continue aspirin clopidogrel.  # Carotid stenosis: Mild by Doppler this  admission.  Continue aspirin, clopidogrel, statin.      For questions or updates, please contact [facility] Please consult [email or web address] for contact info under        Signed, Skeet Latch, MD  10/13/2019, 11:04 AM

## 2019-10-13 NOTE — Progress Notes (Signed)
Spoke w/ Daughter Brandi Gomez, daughter of pt. Clarified visitation - screening required upon entrance w/ green arm band; visitation hours explained; one visitor to see pt per policy explained.  Confusion about "friend" being on list but her being daughter.  Daughter's now added as allowed visitor per pt. Daughter verbalized understanding. Family friend was instructed of visitation policy and he left the building. Encouraged family/friend to communicate who was coming to visit to decrease confusion..  Pt alert and talking, invovled in discussion. Verbalized understanding.

## 2019-10-13 NOTE — Progress Notes (Signed)
EEG completed, results pending. 

## 2019-10-13 NOTE — Progress Notes (Signed)
PT Cancellation Note  Patient Details Name: Brandi Gomez MRN: [id number] DOB: 06-Jul-1971   Cancelled Treatment:    Reason Eval/Treat Not Completed: Other (comment) (EEG).  Ellamae Sia, PT, DPT Acute Rehabilitation Services Pager [phone] Office [phone]    Willy Eddy 10/13/2019, 2:35 PM

## 2019-10-13 NOTE — Progress Notes (Signed)
Pt had another seizure-like episode at 2357 for a whole minute. Tele called HR was 150's again and then drops to 50. Pt unable to follow commands at this moment. Tech was in the room when it started and eyes rolled back and head was shaking. Pt is now somnolent.

## 2019-10-13 NOTE — Significant Event (Signed)
Rapid Response Event Note  Overview:  Called by charge RN to come see pt who just had seizure like activity.  Per bedside RN, pt was sitting in chair and began shaking and drooling. Pt was then unresponsive for a few mins but on my arrival, pt more alert. Pt had returned back to bed and was able to follow commands but still slightly confused. VSS. [place] provider at bedside and ordered to give scheduled Lamictal.    Plan of Care (if not transferred): Continue to monitor for seizure like activity. Bedside and charge RN instructed to call with any changes or concerns.  Event Summary:  called at  2053    event ended at   2115   Pt had another seizure like episode just before midnight. Neurology informed. Labs ordered along with one time dose of Keppra.      Sherilyn Dacosta

## 2019-10-13 NOTE — Patient Outreach (Signed)
[facility]) Care Management  10/13/2019  SELBY HUBBS [birth year] [id number]   Patient noted to be hospitalized.  Will follow hospitalization for disposition.    [name], RN, MSN [facility] Care Management Coordinator Direct Line [phone] Cell [phone] Toll Free: [phone]  Fax: [phone]

## 2019-10-13 NOTE — Progress Notes (Signed)
MD at bedside. Camera operator paged for RN. MD stated she will consult Neurology. Rapid is aware of the first seizure as well. Only medication given prior to was tylenol for HA.

## 2019-10-13 NOTE — Progress Notes (Signed)
PROGRESS NOTE    Brandi Gomez  [id number] DOB: 1971-03-22 DOA: 10/11/2019 PCP: Horald Pollen, MD   Brief Narrative: 48 year old female with CKD stage III type 2 diabetes bipolar disorder depression/hypertension, hyperlipidemia admitted 11/3 with right leg weakness MRI showed 12 mm acute ischemic nonhemorrhagic posterior left frontal lobe infarct, left ACA distribution neurology was consulted and patient was admitted. Overnight patient had 2 breakthrough seizures seen by neurology.  Subjective:  Overnight seizuresx 2- she doesn't rememeber this am aax3 Reports her memory is not good from priro strokes.  Rt leg ffeling stronger- able to lift Lives with friend and daughter at home  Assessment & Plan:   Acute CVA 12 mm acute ischemic nonhemorrhagic posterior left frontal lobe infarct, left ACA distribution, history of stroke in 2018: Presents with right leg weakness.  LDL 92 , HbA1c 6.5, echocardiogram with low EF 35 to 40% continue stroke work-up per neurology carotid duplex,, continue aspirin 325, Lipitor 80, neurologic monitoring PT OT.,SLP.  Breakthrough seizures  x2 overnight with postictal confusion, with history of known partial complex epilepsy status post Keppra x1, Lamictal level sent if low need to increase total daily dose will need to start scheduled Keppra as per neurology.  Continue seizure precaution appreciate neurology input..  Cardiomyopathy with newly reduced EF 35 to [id number] systolic congestive heart failure and diastolic CHF with grade 2 DD: Seen by cardiology appreciate input.Currently euvolemic.  Blocker has been held due to bradycardia.  May need outpatient ischemic evaluation.  Further cardiology.  DM2 : hba1c 6.5 11/13, cont ssi.  Blood sugar is stable.  OSA hx- on cpap here  Bipolar 1 disorder: cont home meds   HLD-cont lipitor  CKD stage 3, GFR 30-59 ml/min-creat stable  Body mass index is 49.13 kg/m.    DVT prophylaxis:  SCD/lovenox Code Status: full Family Communication: plan of care discussed with patient at bedside. Disposition Plan: Remains inpatient pending clinical improvement/ further stroke w/u pt/ot eval..   Consultants: neuro  Procedures:  MRI brain 1. 12 mm acute ischemic nonhemorrhagic posterior left frontal lobe infarct, left ACA distribution. No associated hemorrhage or mass effect. 2. Underlying mild chronic microvascular ischemic disease.  MRA angio head No intracranial large vessel occlusion or proximal high-grade arterial stenosis.  Mild focal stenosis within the non dominant A1 right anterior cerebral artery.  MR lumbar spine 1. No acute abnormality within the lumbar spine. No significant spinal stenosis or evidence for neural impingement. 2. Moderate facet arthropathy at L4-5 and L5-S1 with resultant mild right L4 and left L5 foraminal stenosis. 3. Diffusely decreased T1 marrow signal intensity, nonspecific, but most commonly related to anemia, smoking, or obesity.  Microbiology:  Antimicrobials: Anti-infectives (From admission, onward)   None       Objective: Vitals:   10/12/19 1944 10/12/19 2356 10/12/19 2359 10/13/19 0325  BP: (!) 147/93 (!) 169/139 (!) 160/72 105/63  Pulse: 84 85 64 62  Resp: 20 19  17   Temp: 98.5 F (36.9 C) 98.3 F (36.8 C)  98.5 F (36.9 C)  TempSrc: Oral Oral  Oral  SpO2: 100% 100%  100%  Weight:      Height:        Intake/Output Summary (Last 24 hours) at 10/13/2019 0744 Last data filed at 10/13/2019 0600 Gross per 24 hour  Intake 138.25 ml  Output --  Net 138.25 ml   Filed Weights   10/11/19 2310  Weight: 117.9 kg   Weight change:   Body mass index is 49.13 kg/m.  Intake/Output from previous day: 11/03 0701 - 11/04 0700 In: 138.3 [IV Piggyback:138.3] Out: -  Intake/Output this shift: No intake/output data recorded.  Examination:  General exam: AAO,NAD, Weak appearing. Obese. HEENT:Oral mucosa moist,  Ear/Nose WNL grossly, dentition normal. Respiratory system: Diminished at the base,no wheezing or crackles,no use of accessory muscle Cardiovascular system: S1 & S2 +, No JVD,. Gastrointestinal system: Abdomen soft, NT,ND, BS+ Nervous System:Alert, awake, moving extremities- able to lift  Rt leg more. Extremities: No edema, distal peripheral pulses palpable.  Skin: No rashes,no icterus. MSK: Normal muscle bulk,tone, power  Medications:  Scheduled Meds:  ARIPiprazole  10 mg Oral Daily   aspirin EC  81 mg Oral Daily   atorvastatin  40 mg Oral q1800   clopidogrel  75 mg Oral Daily   enoxaparin (LOVENOX) injection  40 mg Subcutaneous Q24H   insulin aspart  0-5 Units Subcutaneous QHS   insulin aspart  0-9 Units Subcutaneous TID WC   lamoTRIgine  75 mg Oral BID   Vitamin D (Ergocalciferol)  50,000 Units Oral Q Tue   Continuous Infusions:  Data Reviewed: I have personally reviewed following labs and imaging studies  CBC: Recent Labs  Lab 10/11/19 2345  WBC 8.2  NEUTROABS 4.8  HGB 10.4*  HCT 33.2*  MCV 92.0  PLT [id number]   Basic Metabolic Panel: Recent Labs  Lab 10/11/19 2345 10/13/19 0029  NA 138 137  K 4.2 4.0  CL 104 103  CO2 26 25  GLUCOSE 122* 132*  BUN 21* 20  CREATININE 1.52* 1.29*  CALCIUM 8.5* 8.3*  MG  --  1.8  PHOS  --  4.1   GFR: Estimated Creatinine Clearance: 62.4 mL/min (A) (by C-G formula based on SCr of 1.29 mg/dL (H)). Liver Function Tests: Recent Labs  Lab 10/11/19 2345  AST 17  ALT 12  ALKPHOS 75  BILITOT 0.8  PROT 6.9  ALBUMIN 3.3*   No results for input(s): LIPASE, AMYLASE in the last 168 hours. No results for input(s): AMMONIA in the last 168 hours. Coagulation Profile: Recent Labs  Lab 10/12/19 0232  INR 1.0   Cardiac Enzymes: No results for input(s): CKTOTAL, CKMB, CKMBINDEX, TROPONINI in the last 168 hours. BNP (last 3 results) No results for input(s): PROBNP in the last 8760 hours. HbA1C: Recent Labs     10/12/19 0319  HGBA1C 6.5*   CBG: Recent Labs  Lab 10/12/19 0624 10/12/19 1056 10/12/19 1527 10/12/19 2106 10/13/19 0606  GLUCAP 150* 124* 126* 116* 115*   Lipid Profile: Recent Labs    10/12/19 0319  CHOL 123  HDL 49  LDLCALC 64  TRIG 49  CHOLHDL 2.5   Thyroid Function Tests: No results for input(s): TSH, T4TOTAL, FREET4, T3FREE, THYROIDAB in the last 72 hours. Anemia Panel: No results for input(s): VITAMINB12, FOLATE, FERRITIN, TIBC, IRON, RETICCTPCT in the last 72 hours. Sepsis Labs: No results for input(s): PROCALCITON, LATICACIDVEN in the last 168 hours.  Recent Results (from the past 240 hour(s))  SARS CORONAVIRUS 2 (TAT 6-24 HRS) Nasopharyngeal Nasopharyngeal Swab     Status: None   Collection Time: 10/12/19  2:10 AM   Specimen: Nasopharyngeal Swab  Result Value Ref Range Status   SARS Coronavirus 2 NEGATIVE NEGATIVE Final    Comment: (NOTE) SARS-CoV-2 target nucleic acids are NOT DETECTED. The SARS-CoV-2 RNA is generally detectable in upper and lower respiratory specimens during the acute phase of infection. Negative results do not preclude SARS-CoV-2 infection, do not rule out co-infections with other pathogens, and  should not be used as the sole basis for treatment or other patient management decisions. Negative results must be combined with clinical observations, patient history, and epidemiological information. The expected result is Negative. Fact Sheet for Patients: SugarRoll.be Fact Sheet for Healthcare Providers: https://www.woods-mathews.com/ This test is not yet approved or cleared by the [place] FDA and  has been authorized for detection and/or diagnosis of SARS-CoV-2 by FDA under an Emergency Use Authorization (EUA). This EUA will remain  in effect (meaning this test can be used) for the duration of the COVID-19 declaration under Section 56 4(b)(1) of the Act, 21 U.S.C. section 360bbb-3(b)(1),  unless the authorization is terminated or revoked sooner. Performed at [facility], [place] [address], [place], [place] [address]       Radiology Studies: Mr Angio Head Wo Contrast  Result Date: 10/12/2019 CLINICAL DATA:  Stroke, follow-up. EXAM: MRA HEAD WITHOUT CONTRAST TECHNIQUE: Angiographic images of the Circle of Willis were obtained using MRA technique without intravenous contrast. COMPARISON:  Brain MRI 10/12/2019, MRA head 05/21/2017 FINDINGS: The intracranial internal carotid arteries are patent without significant stenosis. The right middle cerebral artery is patent without significant proximal stenosis. The right anterior cerebral artery is patent. Apparent mild focal stenosis within the non dominant right A1 segment (series 252, image 3). The left middle and anterior cerebral arteries are patent without significant proximal stenosis. No intracranial aneurysm is identified. The intracranial vertebral arteries are patent without significant stenosis, as is the basilar artery. The bilateral posterior cerebral arteries are patent without significant proximal stenosis. IMPRESSION: No intracranial large vessel occlusion or proximal high-grade arterial stenosis. Mild focal stenosis within the non dominant A1 right anterior cerebral artery. Electronically Signed   By: Kellie Simmering DO   On: 10/12/2019 12:31   [name]  Result Date: 10/12/2019 CLINICAL DATA:  Initial evaluation for acute right lower extremity weakness, fall. EXAM: MRI HEAD WITHOUT CONTRAST TECHNIQUE: Multiplanar, multiecho pulse sequences of the brain and surrounding structures were obtained without intravenous contrast. COMPARISON:  Prior head CT from 06/30/2019. FINDINGS: Brain: Examination mildly degraded by motion artifact. Cerebral volume within normal limits for age. Patchy T2/FLAIR hyperintensity within the periventricular and deep white matter both cerebral hemispheres noted, nonspecific, but most like  related chronic microvascular ischemic disease, mild in nature. Patchy focus of restricted diffusion measuring approximately 12 mm seen involving the cortical and subcortical aspect of the parasagittal posterior left frontal lobe, left ACA distribution (series 9, images 81, 83). No associated hemorrhage or mass effect. No other evidence for acute or subacute ischemia. Gray-white matter differentiation otherwise maintained. No other areas of remote cortical infarction. No foci of susceptibility artifact to suggest acute or chronic intracranial hemorrhage. No mass lesion, midline shift or mass effect. No hydrocephalus. No extra-axial fluid collection. Pituitary gland suprasellar region normal. Midline structures intact. Vascular: Major intracranial vascular flow voids are maintained. Skull and upper cervical spine: Craniocervical junction normal. Upper cervical spine within normal limits. Diffusely decreased T1 signal intensity seen throughout the visualized bone marrow, nonspecific, but most commonly related to anemia, smoking, or obesity. No discrete osseous lesions. Scalp soft tissues within normal limits. Sinuses/Orbits: Globes and orbital soft tissues within normal limits. Paranasal sinuses are clear. Small bilateral mastoid effusions noted, of doubtful significance. Visualized nasopharynx within normal limits. Inner ear structures grossly normal. Other: None. IMPRESSION: 1. 12 mm acute ischemic nonhemorrhagic posterior left frontal lobe infarct, left ACA distribution. No associated hemorrhage or mass effect. 2. Underlying mild chronic microvascular ischemic  disease. Electronically Signed   By: Jeannine Boga M.D.   On: 10/12/2019 01:13   [name]  Result Date: 10/12/2019 CLINICAL DATA:  Initial evaluation for acute right lower extremity weakness. EXAM: MRI LUMBAR SPINE WITHOUT CONTRAST TECHNIQUE: Multiplanar, multisequence MR imaging of the lumbar spine was performed. No intravenous  contrast was administered. COMPARISON:  None. FINDINGS: Segmentation: Standard. Lowest well-formed disc space labeled the L5-S1 level. Alignment: Physiologic with preservation of the normal lumbar lordosis. No listhesis. Vertebrae: Vertebral body height maintained without evidence for acute or chronic fracture. Bone marrow signal intensity diffusely decreased on T1 weighted imaging, nonspecific, but most commonly related to anemia, smoking, or obesity. Few scattered benign hemangiomata noted. No other discrete or worrisome osseous lesions. No abnormal marrow edema. Conus medullaris and cauda equina: Conus extends to the L1-2 level. Conus and cauda equina appear normal. Paraspinal and other soft tissues: Paraspinous soft tissues within normal limits. Visualized visceral structures are unremarkable. Disc levels: L1-2:  Unremarkable. L2-3:  Unremarkable. L3-4: Normal interspace. Mild bilateral facet hypertrophy. No significant stenosis. L4-5: Minimal annular disc bulge with disc desiccation. Moderate bilateral facet hypertrophy. Associated trace joint effusion on the left. No significant spinal stenosis. Mild right L4 foraminal narrowing. L5-S1: Minimal annular disc bulge with disc desiccation. Moderate left greater than right facet hypertrophy. Associated small bilateral joint effusions. Epidural lipomatosis. No significant spinal stenosis. Mild left L5 foraminal narrowing. IMPRESSION: 1. No acute abnormality within the lumbar spine. No significant spinal stenosis or evidence for neural impingement. 2. Moderate facet arthropathy at L4-5 and L5-S1 with resultant mild right L4 and left L5 foraminal stenosis. 3. Diffusely decreased T1 marrow signal intensity, nonspecific, but most commonly related to anemia, smoking, or obesity. Electronically Signed   By: Jeannine Boga M.D.   On: 10/12/2019 01:19   Vas US Carotid (at [place] Only)  Result Date: 10/12/2019 Carotid Arterial Duplex Study Indications:        CVA. Risk Factors:      Hypertension, hyperlipidemia, Diabetes, no history of                    smoking. Comparison Study:  Previous 05/21/2017 bilateral 1-39% stenosis. Performing Technologist: Estill Batten Mackin : RVT, RDCS (AE), RDMS  Examination Guidelines: A complete evaluation includes B-mode imaging, spectral Doppler, color Doppler, and power Doppler as needed of all accessible portions of each vessel. Bilateral testing is considered an integral part of a complete examination. Limited examinations for reoccurring indications may be performed as noted.  Right Carotid Findings: +----------+--------+--------+--------+------------------+--------+             PSV cm/s EDV cm/s Stenosis Plaque Description Comments  +----------+--------+--------+--------+------------------+--------+  CCA Prox   103      16                                             +----------+--------+--------+--------+------------------+--------+  CCA Distal 71       16                                             +----------+--------+--------+--------+------------------+--------+  ICA Prox   63       19       1-39%                                 +----------+--------+--------+--------+------------------+--------+  ICA Distal 54       12                                             +----------+--------+--------+--------+------------------+--------+  ECA        97       15                                             +----------+--------+--------+--------+------------------+--------+ +----------+--------+-------+----------------+-------------------+             PSV cm/s EDV cms Describe         Arm Pressure (mmHG)  +----------+--------+-------+----------------+-------------------+  Subclavian 143              Multiphasic, WNL                      +----------+--------+-------+----------------+-------------------+ +---------+--------+--+--------+--+---------+  Vertebral PSV cm/s 55 EDV cm/s 19 Antegrade  +---------+--------+--+--------+--+---------+  Left  Carotid Findings: +----------+--------+--------+--------+------------------+--------+             PSV cm/s EDV cm/s Stenosis Plaque Description Comments  +----------+--------+--------+--------+------------------+--------+  CCA Prox   114      24                                             +----------+--------+--------+--------+------------------+--------+  CCA Distal 76       17                                             +----------+--------+--------+--------+------------------+--------+  ICA Prox   60       12       1-39%                                 +----------+--------+--------+--------+------------------+--------+  ICA Distal 78       21                                             +----------+--------+--------+--------+------------------+--------+  ECA        84       15                                             +----------+--------+--------+--------+------------------+--------+ +----------+--------+--------+------------+-------------------+             PSV cm/s EDV cm/s Describe     Arm Pressure (mmHG)  +----------+--------+--------+------------+-------------------+  Subclavian                   Not assessed                      +----------+--------+--------+------------+-------------------+ +---------+--------+--+--------+--+---------+  Vertebral PSV cm/s 44 EDV cm/s 15 Antegrade  +---------+--------+--+--------+--+---------+  Summary: Right Carotid: Velocities  in the right ICA are consistent with a 1-39% stenosis. Left Carotid: Velocities in the left ICA are consistent with a 1-39% stenosis. Vertebrals:  Bilateral vertebral arteries demonstrate antegrade flow. Subclavians: Normal flow hemodynamics were seen in the right subclavian artery.              Left subclavian artery not assessed. *See table(s) above for measurements and observations.  Electronically signed by Harold Barban MD on 10/12/2019 at 5:39:15 PM.    Final       LOS: 1 day   Time spent: More than 50% of that time was spent in  counseling and/or coordination of care.  Antonieta Pert, MD [facility]  10/13/2019, 7:44 AM

## 2019-10-14 ENCOUNTER — Encounter (HOSPITAL_COMMUNITY): Payer: Self-pay | Admitting: Certified Registered"

## 2019-10-14 DIAGNOSIS — G40919 Epilepsy, unspecified, intractable, without status epilepticus: Secondary | ICD-10-CM

## 2019-10-14 LAB — LAMOTRIGINE LEVEL: Lamotrigine Lvl: 2.4 ug/mL (ref 2.0–20.0)

## 2019-10-14 LAB — GLUCOSE, CAPILLARY
Glucose-Capillary: 113 mg/dL — ABNORMAL HIGH (ref 70–99)
Glucose-Capillary: 151 mg/dL — ABNORMAL HIGH (ref 70–99)
Glucose-Capillary: 106 mg/dL — ABNORMAL HIGH (ref 70–99)
Glucose-Capillary: 140 mg/dL — ABNORMAL HIGH (ref 70–99)

## 2019-10-14 MED ORDER — ISOSORBIDE DINITRATE 10 MG PO TABS
10.0000 mg | ORAL_TABLET | Freq: Two times a day (BID) | ORAL | Status: DC
  Administered 2019-10-14 – 2019-10-15 (×3): 10 mg via ORAL
  Filled 2019-10-14 (×4): qty 1

## 2019-10-14 MED ORDER — HYDRALAZINE HCL 10 MG PO TABS
10.0000 mg | ORAL_TABLET | Freq: Two times a day (BID) | ORAL | Status: DC
  Administered 2019-10-14 – 2019-10-15 (×3): 10 mg via ORAL
  Filled 2019-10-14 (×3): qty 1

## 2019-10-14 NOTE — Consult Note (Signed)
   [facility] Inpatient Consult   10/14/2019  Brandi Gomez October 01, 1971 [id number]     [facility] Active status:   Patient is currently active with [facility] Care Management.  She had been previously outreached/ engaged by [employer], pharmacy and SW.Our community based plan of care has focused on disease management and community resource support.  Patient has been recently engaged by [facility] RN CM (who is aware of this admission) for care coordination and  HTN disease management.   Patient will receive a post hospital call and will be evaluated for assessments and disease process educationif transitioning to home.  Patient was reviewed for 45% high risk score for unplanned readmission and hospitalizations with 10 ED visits in the past 6 months under her [facility] Medicare benefits.  Chart reviewed andMD note dated 11/3 20reveals as: 48 year old female with a history of CKD stage III, type 2 diabetes mellitus, bipolar disorder, depression, hypertension hyperlipidemia,    who was admitted with right leg weakness. MRI brain shows 12 mm acute ischemic nonhemorrhagic posterior left frontal lobe infarct, left ACA distribution.  Stroke team/Neurology on board.  Her primary care provider is Dr. Horald Pollen with Primary Care at [facility],  PT/ OT evaluation completed and recommending Inpatient Rehab (CIR). Inpatient Rehab coordinator aware and for a possible CIR admit pending medical workup completion (with noted TEE- transesophageal echocardiogram- planned for Monday).   Plan: Will follow progress and recommendations for disposition with inpatient TOC team and make aware that [facility] CM following. Will update [facility] RN CM of disposition.   For additional questions or referrals,please contact:  Geran Haithcock A. Hailly Fess, BSN, RN-BC [facility] Liaison Cell: [phone]

## 2019-10-14 NOTE — Progress Notes (Signed)
Progress Note  Patient Name: Brandi Gomez Date of Encounter: 10/14/2019  Primary Cardiologist: Skeet Latch, MD   Subjective   Feeling well. No recurrent seizures.  Denies chest pain, palpitations, or shortness of breath.   Inpatient Medications    Scheduled Meds: . ARIPiprazole  10 mg Oral Daily  . aspirin EC  81 mg Oral Daily  . atorvastatin  40 mg Oral q1800  . clopidogrel  75 mg Oral Daily  . enoxaparin (LOVENOX) injection  40 mg Subcutaneous Q24H  . insulin aspart  0-5 Units Subcutaneous QHS  . insulin aspart  0-9 Units Subcutaneous TID WC  . lamoTRIgine  100 mg Oral BID  . Vitamin D (Ergocalciferol)  50,000 Units Oral Q Tue   Continuous Infusions:  PRN Meds: acetaminophen **OR** acetaminophen (TYLENOL) oral liquid 160 mg/5 mL **OR** acetaminophen, senna-docusate   Vital Signs    Vitals:   10/13/19 2146 10/13/19 2327 10/14/19 0340 10/14/19 0816  BP:  102/66 112/63 127/69  Pulse: 68 66 (!) 55 67  Resp: (!) 22 19 12 12   Temp:  99.1 F (37.3 C) 99.4 F (37.4 C) (!) 97.5 F (36.4 C)  TempSrc:  Oral Oral Oral  SpO2: 94% 94% 99% 98%  Weight:      Height:        Intake/Output Summary (Last 24 hours) at 10/14/2019 0957 Last data filed at 10/14/2019 0400 Gross per 24 hour  Intake -  Output 900 ml  Net -900 ml   Last 3 Weights 10/11/2019 08/28/2019 08/09/2019  Weight (lbs) 260 lb 275 lb 9.2 oz 274 lb 6.4 oz  Weight (kg) 117.935 kg 125 kg 124.467 kg  Some encounter information is confidential and restricted. Go to Review Flowsheets activity to see all data.      Telemetry    Sinus rhythm, sinus bradycardia.  - Personally Reviewed  ECG    n/a - Personally Reviewed  Physical Exam   VS:  BP 127/69 (BP Location: Left Arm)   Pulse 67   Temp (!) 97.5 F (36.4 C) (Oral)   Resp 12   Ht 5\' 1"  (1.549 m)   Wt 117.9 kg   SpO2 98%   BMI 49.13 kg/m  , BMI Body mass index is 49.13 kg/m. GENERAL:  Well appearing.  No acute distress.  Morbidly obese  HEENT: Pupils equal round and reactive, fundi not visualized, oral mucosa unremarkable NECK:  No jugular venous distention, waveform within normal limits, carotid upstroke brisk and symmetric, no bruits LUNGS:  Clear to auscultation bilaterally HEART:  RRR.  PMI not displaced or sustained,S1 and S2 within normal limits, no S3, no S4, no clicks, no rubs, no murmurs ABD:  Flat, positive bowel sounds normal in frequency in pitch, no bruits, no rebound, no guarding, no midline pulsatile mass, no hepatomegaly, no splenomegaly EXT:  2 plus pulses throughout, no edema, no cyanosis no clubbing SKIN:  No rashes no nodules NEURO:  Cranial nerves II through XII grossly intact, motor grossly intact throughout PSYCH:  Cognitively intact, oriented to person place and time.  Flat affect  Labs    High Sensitivity Troponin:  No results for input(s): TROPONINIHS in the last 720 hours.    Chemistry Recent Labs  Lab 10/11/19 2345 10/13/19 0029  NA 138 137  K 4.2 4.0  CL 104 103  CO2 26 25  GLUCOSE 122* 132*  BUN 21* 20  CREATININE 1.52* 1.29*  CALCIUM 8.5* 8.3*  PROT 6.9  --   ALBUMIN  3.3*  --   AST 17  --   ALT 12  --   ALKPHOS 75  --   BILITOT 0.8  --   GFRNONAA 40* 48*  GFRAA 46* 56*  ANIONGAP 8 9     Hematology Recent Labs  Lab 10/11/19 2345  WBC 8.2  RBC 3.61*  HGB 10.4*  HCT 33.2*  MCV 92.0  MCH 28.8  MCHC 31.3  RDW 14.6  PLT 350    BNPNo results for input(s): BNP, PROBNP in the last 168 hours.   DDimer No results for input(s): DDIMER in the last 168 hours.   Radiology    Mr Angio Head Wo Contrast  Result Date: 10/12/2019 CLINICAL DATA:  Stroke, follow-up. EXAM: MRA HEAD WITHOUT CONTRAST TECHNIQUE: Angiographic images of the Circle of Willis were obtained using MRA technique without intravenous contrast. COMPARISON:  Brain MRI 10/12/2019, MRA head 05/21/2017 FINDINGS: The intracranial internal carotid arteries are patent without significant stenosis. The right middle  cerebral artery is patent without significant proximal stenosis. The right anterior cerebral artery is patent. Apparent mild focal stenosis within the non dominant right A1 segment (series 252, image 3). The left middle and anterior cerebral arteries are patent without significant proximal stenosis. No intracranial aneurysm is identified. The intracranial vertebral arteries are patent without significant stenosis, as is the basilar artery. The bilateral posterior cerebral arteries are patent without significant proximal stenosis. IMPRESSION: No intracranial large vessel occlusion or proximal high-grade arterial stenosis. Mild focal stenosis within the non dominant A1 right anterior cerebral artery. Electronically Signed   By: Kellie Simmering DO   On: 10/12/2019 12:31   Vas US Carotid (at [place] Only)  Result Date: 10/12/2019 Carotid Arterial Duplex Study Indications:       CVA. Risk Factors:      Hypertension, hyperlipidemia, Diabetes, no history of                    smoking. Comparison Study:  Previous 05/21/2017 bilateral 1-39% stenosis. Performing Technologist: Estill Batten Mackin : RVT, RDCS (AE), RDMS  Examination Guidelines: A complete evaluation includes B-mode imaging, spectral Doppler, color Doppler, and power Doppler as needed of all accessible portions of each vessel. Bilateral testing is considered an integral part of a complete examination. Limited examinations for reoccurring indications may be performed as noted.  Right Carotid Findings: +----------+--------+--------+--------+------------------+--------+           PSV cm/sEDV cm/sStenosisPlaque DescriptionComments +----------+--------+--------+--------+------------------+--------+ CCA Prox  103     16                                         +----------+--------+--------+--------+------------------+--------+ CCA Distal71      16                                          +----------+--------+--------+--------+------------------+--------+ ICA Prox  63      19      1-39%                              +----------+--------+--------+--------+------------------+--------+ ICA Distal54      12                                         +----------+--------+--------+--------+------------------+--------+  ECA       97      15                                         +----------+--------+--------+--------+------------------+--------+ +----------+--------+-------+----------------+-------------------+           PSV cm/sEDV cmsDescribe        Arm Pressure (mmHG) +----------+--------+-------+----------------+-------------------+ [id number]            Multiphasic, WNL                    +----------+--------+-------+----------------+-------------------+ +---------+--------+--+--------+--+---------+ VertebralPSV cm/s55EDV cm/s19Antegrade +---------+--------+--+--------+--+---------+  Left Carotid Findings: +----------+--------+--------+--------+------------------+--------+           PSV cm/sEDV cm/sStenosisPlaque DescriptionComments +----------+--------+--------+--------+------------------+--------+ CCA Prox  114     24                                         +----------+--------+--------+--------+------------------+--------+ CCA Distal76      17                                         +----------+--------+--------+--------+------------------+--------+ ICA Prox  60      12      1-39%                              +----------+--------+--------+--------+------------------+--------+ ICA Distal78      21                                         +----------+--------+--------+--------+------------------+--------+ ECA       84      15                                         +----------+--------+--------+--------+------------------+--------+ +----------+--------+--------+------------+-------------------+           PSV cm/sEDV  cm/sDescribe    Arm Pressure (mmHG) +----------+--------+--------+------------+-------------------+ Subclavian                Not assessed                    +----------+--------+--------+------------+-------------------+ +---------+--------+--+--------+--+---------+ VertebralPSV cm/s44EDV cm/s15Antegrade +---------+--------+--+--------+--+---------+  Summary: Right Carotid: Velocities in the right ICA are consistent with a 1-39% stenosis. Left Carotid: Velocities in the left ICA are consistent with a 1-39% stenosis. Vertebrals:  Bilateral vertebral arteries demonstrate antegrade flow. Subclavians: Normal flow hemodynamics were seen in the right subclavian artery.              Left subclavian artery not assessed. *See table(s) above for measurements and observations.  Electronically signed by Harold Barban MD on 10/12/2019 at 5:39:15 PM.    Final     Cardiac Studies   Echo 10/12/19: IMPRESSIONS   1. Left ventricular ejection fraction, by visual estimation, is 35 to 40%. The left ventricle has moderately decreased function. There is mildly increased left ventricular hypertrophy.  2. Left ventricular diastolic parameters are consistent with Grade II diastolic dysfunction (pseudonormalization).  3. Global right ventricle has  normal systolic function.The right ventricular size is normal.  4. Left atrial size was normal.  5. Right atrial size was normal.  6. The mitral valve is normal in structure. No evidence of mitral valve regurgitation. No evidence of mitral stenosis.  7. The tricuspid valve is normal in structure. Tricuspid valve regurgitation is trivial.  8. The aortic valve is normal in structure. Aortic valve regurgitation is not visualized. No evidence of aortic valve sclerosis or stenosis.  9. The pulmonic valve was not well visualized. Pulmonic valve regurgitation is not visualized. 10. Technically difficult; moderate global reduction in LV systolic function; grade 2 diastolic  dysfunction; mild LVH.  Carotid Doppler: Summary: Right Carotid: Velocities in the right ICA are consistent with a 1-39% stenosis.  Left Carotid: Velocities in the left ICA are consistent with a 1-39% stenosis.  Patient Profile     Ms. Windish is a 85F with prior stroke, chronic systolic and diastolic heart failure, hypertension, hyperlipidemia, morbid obesity, CKD3, OSA on CPAP, and mental disability admitted with recurrent stroke.  Cardiology was consulted due to worsening systolic function.  Assessment & Plan    # Chronic systolic and diastolic heart failure:  LVEF is reduced to 35-40% this admission from 40-45% previously.  Overall this is not a very significant change, though I do concur there is a slight change on the echocardiogram.  Clinically she does not have any heart failure symptoms.  Her beta-blocker has been held due to bradycardia.  She had angioedema with lisinopril so no ARB or ARNI.  Will try to add a low dose of hydralazine/nitrates.   After her 2 episodes of seizure she did have bradycardia to the 40s and 50s.  Therefore, would not rechallenge with a beta-blocker at this time.  She has not been on an ACE inhibitor/ARB or ARN I.  Blood pressure remains too low for this at this time.  If her renal function continues to improve and her blood pressure increases we will add a low-dose of losartan.  She ultimately will need an ischemic work-up.  Given her body habitus cardiac catheterization is most likely to be definitive about her coronary anatomy.  However I do not think this needs to be done acutely.  In light of her recent stroke and recurrent seizures will likely plan to do this as an outpatient.  # Recurrent stroke:  No afib/pfo noted at the time of her prior stroke.  Loop recorder removed 2/2 patient discomfort. No plan to replace.  Continue aspirin clopidogrel.  We will get a repeat TEE to assess for thrombus.  Unfortunately there is no room in the schedule for tomorrow.   We have ordered for Monday.  From a cardiology standpoint if she is otherwise ready to be discharged over the weekend, this could occur as an outpatient.  Request that they try to get apical views given the limitations of her TTE.   # Carotid stenosis: Mild by Doppler this admission.  Continue aspirin, clopidogrel, statin.      For questions or updates, please contact [place] Please consult [email or web address] for contact info under        Signed, Skeet Latch, MD  10/14/2019, 9:57 AM

## 2019-10-14 NOTE — Significant Event (Signed)
This note also relates to the following rows which could not be included: BP - Cannot attach notes to unvalidated device data MAP (mmHg) - Cannot attach notes to unvalidated device data ECG Heart Rate - Cannot attach notes to unvalidated device data Resp - Cannot attach notes to unvalidated device data    10/14/19 1955  What Happened  Was fall witnessed? No  Was patient injured? No  Patient found on floor  Found by Staff-comment Baxter Flattery)  Stated prior activity other (comment) (Sitting in chair watching TV)  Follow Up  MD notified Voncille Lo NP (Notified [facility] by [facility] Amion Message)  Time MD notified 2005  Family notified No - patient refusal  Additional tests No  Progress note created (see row info) Yes

## 2019-10-14 NOTE — Progress Notes (Signed)
PROGRESS NOTE    Brandi Gomez  [id number] DOB: 03-Jun-1971 DOA: 10/11/2019 PCP: Horald Pollen, MD   Brief Narrative: 48 year old female with CKD stage III type 2 diabetes bipolar disorder depression/hypertension, hyperlipidemia admitted 11/3 with right leg weakness MRI showed 12 mm acute ischemic nonhemorrhagic posterior left frontal lobe infarct, left ACA distribution neurology was consulted and patient was admitted. Overnight patient had 2 breakthrough seizures seen by neurology 11/5. Overall patient is clinically improving, 11/6-EEG and Lamictal dose increased.  Subjective: Seen and examined this morning. No acute events overnight.  Currently getting EEG in the room.   She feels well.  Rt leg felling stronger- able to lift Lives with friend and daughter at home  Assessment & Plan:   Acute CVA 12 mm acute ischemic nonhemorrhagic posterior left frontal lobe infarct, left ACA distribution, history of stroke in 2018: Presents with right leg weakness.  LDL 92 , HbA1c 6.5, echocardiogram with low EF 35 to 40%, carotid Doppler B IC 1-39% stenosis, VAs antegrade. Appreciate neurology input, continue aspirin 81, Plavix 75 x 3 weeks then continue aspirin alone.  Continue Lipitor 80 mg.Continue PT has advised CIR.  Monitor neuro check.  Telemetry.  History of prior stroke/TIA June/2018 at that time TEE unremarkable, no PFO or loop recorder placed 6/22 no A. fib found and removed 03/04/18  Breakthrough seizures  x2 11/5 night with postictal confusion, with history of known partial complex epilepsy status post Keppra x1, Lamictal level pending.  Lamictal dose increased to 100 twice daily, patient not tolerating Keppra Tegretol or Vimpat in the past.  Patient is to follow-up with Dr. Arlana Hove as outpatient. Continue seizure prophylaxis.  Cardiomyopathy with newly reduced EF 35 to [id number] systolic congestive heart failure and diastolic CHF with grade 2 DD: Seen by cardiology  appreciate input.Currently euvolemic.  Blocker has been held due to bradycardia.  May need outpatient ischemic evaluation.  Further cardiology.  DM2 : hba1c 6.5 11/13, cont ssi.  Blood sugar is stable.  OSA hx- on cpap here  Bipolar 1 disorder: cont home meds   HLD-cont lipitor  CKD stage 3, GFR 30-59 ml/min-creat stable  Body mass index is 49.13 kg/m.    DVT prophylaxis: SCD/lovenox Code Status: full Family Communication: plan of care discussed with patient at bedside. Disposition Plan: Remains inpatient pending clinical improvement and completion of work-up for stroke/seizure and neurology signed off.  Planning for CIR placement, should be ready once insurance approves, neuro is okay.  Consultants: neuro  Procedures:  EEG 11/6: showed evidence of epileptogenicity arising from left and right frontal region. Its possible that patient has a single focus with either left or right spread vs independent bilateral foci. Additionally, there is evidence of mild diffuse encephalopathy, nonspecific to etiology. No seizures were seen throughout the recording.  MRI brain 1. 12 mm acute ischemic nonhemorrhagic posterior left frontal lobe infarct, left ACA distribution. No associated hemorrhage or mass effect. 2. Underlying mild chronic microvascular ischemic disease.  MRA angio head No intracranial large vessel occlusion or proximal high-grade arterial stenosis.  Mild focal stenosis within the non dominant A1 right anterior cerebral artery.  MR lumbar spine 1. No acute abnormality within the lumbar spine. No significant spinal stenosis or evidence for neural impingement. 2. Moderate facet arthropathy at L4-5 and L5-S1 with resultant mild right L4 and left L5 foraminal stenosis. 3. Diffusely decreased T1 marrow signal intensity, nonspecific, but most commonly related to anemia, smoking, or obesity.  Microbiology:  Antimicrobials: Anti-infectives (From admission, onward)  None       Objective: Vitals:   10/13/19 2146 10/13/19 2327 10/14/19 0340 10/14/19 0816  BP:  102/66 112/63 127/69  Pulse: 68 66 (!) 55 67  Resp: (!) 22 19 12 12   Temp:  99.1 F (37.3 C) 99.4 F (37.4 C) (!) 97.5 F (36.4 C)  TempSrc:  Oral Oral Oral  SpO2: 94% 94% 99% 98%  Weight:      Height:        Intake/Output Summary (Last 24 hours) at 10/14/2019 1100 Last data filed at 10/14/2019 0400 Gross per 24 hour  Intake --  Output 900 ml  Net -900 ml   Filed Weights   10/11/19 2310  Weight: 117.9 kg   Weight change:   Body mass index is 49.13 kg/m.  Intake/Output from previous day: 11/04 0701 - 11/05 0700 In: -  Out: 900 [Urine:900] Intake/Output this shift: No intake/output data recorded.  Examination:  General exam: AAO,NAD, weak/frail, obese. HEENT:Oral mucosa moist, Ear/Nose WNL grossly, dentition normal. Respiratory system: Diminished at the base,no wheezing or crackles, NT,no use of accessory muscle Cardiovascular system: S1 & S2 +, No JVD, regular RR. Gastrointestinal system: Abdomen soft, NT,ND, BS+ Nervous System:Alert, awake, moving both extremities including right leg with good strength.   Extremities: No edema, distal peripheral pulses palpable.  Skin: No rashes,no icterus. MSK: Normal muscle bulk,tone, power  Medications:  Scheduled Meds:  ARIPiprazole  10 mg Oral Daily   aspirin EC  81 mg Oral Daily   atorvastatin  40 mg Oral q1800   clopidogrel  75 mg Oral Daily   enoxaparin (LOVENOX) injection  40 mg Subcutaneous Q24H   hydrALAZINE  10 mg Oral BID   insulin aspart  0-5 Units Subcutaneous QHS   insulin aspart  0-9 Units Subcutaneous TID WC   isosorbide dinitrate  10 mg Oral BID   lamoTRIgine  100 mg Oral BID   Vitamin D (Ergocalciferol)  50,000 Units Oral Q Tue   Continuous Infusions:  Data Reviewed: I have personally reviewed following labs and imaging studies  CBC: Recent Labs  Lab 10/11/19 2345  WBC 8.2  NEUTROABS  4.8  HGB 10.4*  HCT 33.2*  MCV 92.0  PLT [id number]   Basic Metabolic Panel: Recent Labs  Lab 10/11/19 2345 10/13/19 0029  NA 138 137  K 4.2 4.0  CL 104 103  CO2 26 25  GLUCOSE 122* 132*  BUN 21* 20  CREATININE 1.52* 1.29*  CALCIUM 8.5* 8.3*  MG  --  1.8  PHOS  --  4.1   GFR: Estimated Creatinine Clearance: 62.4 mL/min (A) (by C-G formula based on SCr of 1.29 mg/dL (H)). Liver Function Tests: Recent Labs  Lab 10/11/19 2345  AST 17  ALT 12  ALKPHOS 75  BILITOT 0.8  PROT 6.9  ALBUMIN 3.3*   No results for input(s): LIPASE, AMYLASE in the last 168 hours. No results for input(s): AMMONIA in the last 168 hours. Coagulation Profile: Recent Labs  Lab 10/12/19 0232  INR 1.0   Cardiac Enzymes: No results for input(s): CKTOTAL, CKMB, CKMBINDEX, TROPONINI in the last 168 hours. BNP (last 3 results) No results for input(s): PROBNP in the last 8760 hours. HbA1C: Recent Labs    10/12/19 0319  HGBA1C 6.5*   CBG: Recent Labs  Lab 10/13/19 0606 10/13/19 1122 10/13/19 1526 10/13/19 2130 10/14/19 0713  GLUCAP 115* 109* 165* 107* 113*   Lipid Profile: Recent Labs    10/12/19 0319  CHOL 123  HDL 49  LDLCALC 64  TRIG 49  CHOLHDL 2.5   Thyroid Function Tests: No results for input(s): TSH, T4TOTAL, FREET4, T3FREE, THYROIDAB in the last 72 hours. Anemia Panel: No results for input(s): VITAMINB12, FOLATE, FERRITIN, TIBC, IRON, RETICCTPCT in the last 72 hours. Sepsis Labs: No results for input(s): PROCALCITON, LATICACIDVEN in the last 168 hours.  Recent Results (from the past 240 hour(s))  SARS CORONAVIRUS 2 (TAT 6-24 HRS) Nasopharyngeal Nasopharyngeal Swab     Status: None   Collection Time: 10/12/19  2:10 AM   Specimen: Nasopharyngeal Swab  Result Value Ref Range Status   SARS Coronavirus 2 NEGATIVE NEGATIVE Final    Comment: (NOTE) SARS-CoV-2 target nucleic acids are NOT DETECTED. The SARS-CoV-2 RNA is generally detectable in upper and lower respiratory  specimens during the acute phase of infection. Negative results do not preclude SARS-CoV-2 infection, do not rule out co-infections with other pathogens, and should not be used as the sole basis for treatment or other patient management decisions. Negative results must be combined with clinical observations, patient history, and epidemiological information. The expected result is Negative. Fact Sheet for Patients: SugarRoll.be Fact Sheet for Healthcare Providers: https://www.woods-mathews.com/ This test is not yet approved or cleared by the [place] FDA and  has been authorized for detection and/or diagnosis of SARS-CoV-2 by FDA under an Emergency Use Authorization (EUA). This EUA will remain  in effect (meaning this test can be used) for the duration of the COVID-19 declaration under Section 56 4(b)(1) of the Act, 21 U.S.C. section 360bbb-3(b)(1), unless the authorization is terminated or revoked sooner. Performed at [facility], [place] [address] [place], [place] [address]       Radiology Studies: Mr Angio Head Wo Contrast  Result Date: 10/12/2019 CLINICAL DATA:  Stroke, follow-up. EXAM: MRA HEAD WITHOUT CONTRAST TECHNIQUE: Angiographic images of the Circle of Willis were obtained using MRA technique without intravenous contrast. COMPARISON:  Brain MRI 10/12/2019, MRA head 05/21/2017 FINDINGS: The intracranial internal carotid arteries are patent without significant stenosis. The right middle cerebral artery is patent without significant proximal stenosis. The right anterior cerebral artery is patent. Apparent mild focal stenosis within the non dominant right A1 segment (series 252, image 3). The left middle and anterior cerebral arteries are patent without significant proximal stenosis. No intracranial aneurysm is identified. The intracranial vertebral arteries are patent without significant stenosis, as is the basilar artery. The  bilateral posterior cerebral arteries are patent without significant proximal stenosis. IMPRESSION: No intracranial large vessel occlusion or proximal high-grade arterial stenosis. Mild focal stenosis within the non dominant A1 right anterior cerebral artery. Electronically Signed   By: Kellie Simmering DO   On: 10/12/2019 12:31   Vas US Carotid (at [place] Only)  Result Date: 10/12/2019 Carotid Arterial Duplex Study Indications:       CVA. Risk Factors:      Hypertension, hyperlipidemia, Diabetes, no history of                    smoking. Comparison Study:  Previous 05/21/2017 bilateral 1-39% stenosis. Performing Technologist: Estill Batten Mackin : RVT, RDCS (AE), RDMS  Examination Guidelines: A complete evaluation includes B-mode imaging, spectral Doppler, color Doppler, and power Doppler as needed of all accessible portions of each vessel. Bilateral testing is considered an integral part of a complete examination. Limited examinations for reoccurring indications may be performed as noted.  Right Carotid Findings: +----------+--------+--------+--------+------------------+--------+             PSV cm/s  EDV cm/s Stenosis Plaque Description Comments  +----------+--------+--------+--------+------------------+--------+  CCA Prox   103      16                                             +----------+--------+--------+--------+------------------+--------+  CCA Distal 71       16                                             +----------+--------+--------+--------+------------------+--------+  ICA Prox   63       19       1-39%                                 +----------+--------+--------+--------+------------------+--------+  ICA Distal 54       12                                             +----------+--------+--------+--------+------------------+--------+  ECA        97       15                                             +----------+--------+--------+--------+------------------+--------+  +----------+--------+-------+----------------+-------------------+             PSV cm/s EDV cms Describe         Arm Pressure (mmHG)  +----------+--------+-------+----------------+-------------------+  Subclavian 143              Multiphasic, WNL                      +----------+--------+-------+----------------+-------------------+ +---------+--------+--+--------+--+---------+  Vertebral PSV cm/s 55 EDV cm/s 19 Antegrade  +---------+--------+--+--------+--+---------+  Left Carotid Findings: +----------+--------+--------+--------+------------------+--------+             PSV cm/s EDV cm/s Stenosis Plaque Description Comments  +----------+--------+--------+--------+------------------+--------+  CCA Prox   114      24                                             +----------+--------+--------+--------+------------------+--------+  CCA Distal 76       17                                             +----------+--------+--------+--------+------------------+--------+  ICA Prox   60       12       1-39%                                 +----------+--------+--------+--------+------------------+--------+  ICA Distal 78       21                                             +----------+--------+--------+--------+------------------+--------+  ECA        84       15                                             +----------+--------+--------+--------+------------------+--------+ +----------+--------+--------+------------+-------------------+             PSV cm/s EDV cm/s Describe     Arm Pressure (mmHG)  +----------+--------+--------+------------+-------------------+  Subclavian                   Not assessed                      +----------+--------+--------+------------+-------------------+ +---------+--------+--+--------+--+---------+  Vertebral PSV cm/s 44 EDV cm/s 15 Antegrade  +---------+--------+--+--------+--+---------+  Summary: Right Carotid: Velocities in the right ICA are consistent with a 1-39% stenosis. Left Carotid:  Velocities in the left ICA are consistent with a 1-39% stenosis. Vertebrals:  Bilateral vertebral arteries demonstrate antegrade flow. Subclavians: Normal flow hemodynamics were seen in the right subclavian artery.              Left subclavian artery not assessed. *See table(s) above for measurements and observations.  Electronically signed by Harold Barban MD on 10/12/2019 at 5:39:15 PM.    Final       LOS: 2 days   Time spent: More than 50% of that time was spent in counseling and/or coordination of care.  Antonieta Pert, MD [facility]  10/14/2019, 11:00 AM

## 2019-10-14 NOTE — Progress Notes (Signed)
Physical Therapy Treatment Patient Details Name: Brandi Gomez MRN: [id number] DOB: 1971/01/06 Today's Date: 10/14/2019    History of Present Illness Brandi Gomez is a 48 y.o. female with medical history significant of anemia, anxiety, bipolar disorder, depression, type 2 diabetes, CKD stage III, CVA, hypertension, hyperlipidemia, IBS, mild mental retardation, obesity (BMI 49.13), seizures, sleep apnea presenting with complaints of right leg weakness.  MRI revealed L posterior frontal lobe CVA.    PT Comments    Pt with no LOB with gait today, but continues to demonstrate decreased safety with slow processing.  At this time, she needs increased time with multi-modal cueing for safe mobility.  Pt is an excellent rehab candidate and works hard in therapy.  Con't to recommend CIR.   Follow Up Recommendations  CIR     Equipment Recommendations  Other (comment)(to be determined in next venue of care)    Recommendations for Other Services       Precautions / Restrictions Precautions Precautions: Fall Restrictions Weight Bearing Restrictions: No    Mobility  Bed Mobility Overal bed mobility: Needs Assistance Bed Mobility: Supine to Sit   Sidelying to sit: Min guard;HOB elevated       General bed mobility comments: min/guard to EOB with use of rails and HOB elevated.. Able to scoot to EOB with cues and no physical A.  Transfers Overall transfer level: Needs assistance Equipment used: Rolling walker (2 wheeled) Transfers: Sit to/from Stand Sit to Stand: Min assist;+2 safety/equipment         General transfer comment: multi-modal cueing for hand placement and MIN A for powering up and safety  Ambulation/Gait Ambulation/Gait assistance: Min assist;+2 safety/equipment Gait Distance (Feet): 60 Feet Assistive device: Rolling walker (2 wheeled) Gait Pattern/deviations: Step-through pattern;Decreased step length - right;Decreased step length - left Gait velocity:  decreased   General Gait Details: no LOB noted with gait today. Slow cadence and deliberate steps with recliner follow today.   Stairs             Wheelchair Mobility    Modified Rankin (Stroke Patients Only) Modified Rankin (Stroke Patients Only) Pre-Morbid Rankin Score: Moderate disability Modified Rankin: Moderately severe disability     Balance Overall balance assessment: Needs assistance Sitting-balance support: No upper extremity supported;Feet supported Sitting balance-Leahy Scale: Fair Sitting balance - Comments: no physical A needed at EOB.   Standing balance support: Bilateral upper extremity supported Standing balance-Leahy Scale: Poor Standing balance comment: requires UE support for safety and balance                            Cognition Arousal/Alertness: Awake/alert Behavior During Therapy: WFL for tasks assessed/performed;Impulsive Overall Cognitive Status: No family/caregiver present to determine baseline cognitive functioning                     Current Attention Level: Sustained Memory: Decreased recall of precautions Following Commands: Follows one step commands with increased time;Follows multi-step commands inconsistently Safety/Judgement: Decreased awareness of safety;Decreased awareness of deficits Awareness: Emergent Problem Solving: Slow processing;Decreased initiation;Difficulty sequencing;Requires verbal cues;Requires tactile cues General Comments: slow processing, but with increased time and cues, able to complete tasks      Exercises      General Comments        Pertinent Vitals/Pain Pain Assessment: No/denies pain    Home Living   Living Arrangements: (;ives with daughter, Denton Ar and friend, [name])  Prior Function        Comments: daughter, Denton Ar manages her meds due to pt's memory issues   PT Goals (current goals can now be found in the care plan section) Acute Rehab PT  Goals Patient Stated Goal: rehab then home with support Potential to Achieve Goals: Good Progress towards PT goals: Progressing toward goals    Frequency    Min 4X/week      PT Plan Current plan remains appropriate    Co-evaluation              AM-PAC PT "6 Clicks" Mobility   Outcome Measure  Help needed turning from your back to your side while in a flat bed without using bedrails?: A Little Help needed moving from lying on your back to sitting on the side of a flat bed without using bedrails?: A Little Help needed moving to and from a bed to a chair (including a wheelchair)?: A Little Help needed standing up from a chair using your arms (e.g., wheelchair or bedside chair)?: A Little Help needed to walk in hospital room?: A Little Help needed climbing 3-5 steps with a railing? : A Lot 6 Click Score: 17    End of Session Equipment Utilized During Treatment: Gait belt Activity Tolerance: Patient tolerated treatment well Patient left: in chair;with call bell/phone within reach;with chair alarm set Nurse Communication: Mobility status PT Visit Diagnosis: Unsteadiness on feet (R26.81);History of falling (Z91.81);Ataxic gait (R26.0);Difficulty in walking, not elsewhere classified (R26.2)     Time: [id number] PT Time Calculation (min) (ACUTE ONLY): 24 min  Charges:  $Gait Training: 8-22 mins $Therapeutic Activity: 8-22 mins                     Paizlee Kinder L. Tamala Julian, [place] Pager [phone] 10/14/2019    Galen Manila 10/14/2019, 2:17 PM

## 2019-10-14 NOTE — Progress Notes (Signed)
STROKE TEAM PROGRESS NOTE   INTERVAL HISTORY Pt sitting in chair for lunch. Pt no seizure episode overnight. LTM EEG showed no seizure but frequent bi-frontal sharps.    Vitals:   10/13/19 2327 10/14/19 0340 10/14/19 0816 10/14/19 1149  BP: 102/66 112/63 127/69 117/75  Pulse: 66 (!) 55 67 76  Resp: 19 12 12 17   Temp: 99.1 F (37.3 C) 99.4 F (37.4 C) (!) 97.5 F (36.4 C) 98.2 F (36.8 C)  TempSrc: Oral Oral Oral Oral  SpO2: 94% 99% 98% 100%  Weight:      Height:        CBC:  Recent Labs  Lab 10/11/19 2345  WBC 8.2  NEUTROABS 4.8  HGB 10.4*  HCT 33.2*  MCV 92.0  PLT [id number]    Basic Metabolic Panel:  Recent Labs  Lab 10/11/19 2345 10/13/19 0029  NA 138 137  K 4.2 4.0  CL 104 103  CO2 26 25  GLUCOSE 122* 132*  BUN 21* 20  CREATININE 1.52* 1.29*  CALCIUM 8.5* 8.3*  MG  --  1.8  PHOS  --  4.1   Lipid Panel:     Component Value Date/Time   CHOL 123 10/12/2019 0319   TRIG 49 10/12/2019 0319   HDL 49 10/12/2019 0319   CHOLHDL 2.5 10/12/2019 0319   VLDL 10 10/12/2019 0319   LDLCALC 64 10/12/2019 0319   HgbA1c:  Lab Results  Component Value Date   HGBA1C 6.5 (H) 10/12/2019   Urine Drug Screen:     Component Value Date/Time   LABOPIA NONE DETECTED 10/12/2019 1745   COCAINSCRNUR NONE DETECTED 10/12/2019 1745   LABBENZ NONE DETECTED 10/12/2019 1745   AMPHETMU NONE DETECTED 10/12/2019 1745   THCU NONE DETECTED 10/12/2019 1745   LABBARB NONE DETECTED 10/12/2019 1745    Alcohol Level     Component Value Date/Time   ETH <10 07/14/2019 1511    IMAGING Vas US Carotid (at [facility] [place] [facility] [place] Only)  Result Date: 10/12/2019 Carotid Arterial Duplex Study Indications:       CVA. Risk Factors:      Hypertension, hyperlipidemia, Diabetes, no history of                    smoking. Comparison Study:  Previous 05/21/2017 bilateral 1-39% stenosis. Performing Technologist: Estill Batten Mackin : RVT, RDCS (AE), RDMS  Examination Guidelines: A complete evaluation includes B-mode  imaging, spectral Doppler, color Doppler, and power Doppler as needed of all accessible portions of each vessel. Bilateral testing is considered an integral part of a complete examination. Limited examinations for reoccurring indications may be performed as noted.  Right Carotid Findings: +----------+--------+--------+--------+------------------+--------+           PSV cm/sEDV cm/sStenosisPlaque DescriptionComments +----------+--------+--------+--------+------------------+--------+ CCA Prox  103     16                                         +----------+--------+--------+--------+------------------+--------+ CCA Distal71      16                                         +----------+--------+--------+--------+------------------+--------+ ICA Prox  63      19      1-39%                              +----------+--------+--------+--------+------------------+--------+  ICA Distal54      12                                         +----------+--------+--------+--------+------------------+--------+ ECA       97      15                                         +----------+--------+--------+--------+------------------+--------+ +----------+--------+-------+----------------+-------------------+           PSV cm/sEDV cmsDescribe        Arm Pressure (mmHG) +----------+--------+-------+----------------+-------------------+ [id number]            Multiphasic, WNL                    +----------+--------+-------+----------------+-------------------+ +---------+--------+--+--------+--+---------+ VertebralPSV cm/s55EDV cm/s19Antegrade +---------+--------+--+--------+--+---------+  Left Carotid Findings: +----------+--------+--------+--------+------------------+--------+           PSV cm/sEDV cm/sStenosisPlaque DescriptionComments +----------+--------+--------+--------+------------------+--------+ CCA Prox  114     24                                          +----------+--------+--------+--------+------------------+--------+ CCA Distal76      17                                         +----------+--------+--------+--------+------------------+--------+ ICA Prox  60      12      1-39%                              +----------+--------+--------+--------+------------------+--------+ ICA Distal78      21                                         +----------+--------+--------+--------+------------------+--------+ ECA       84      15                                         +----------+--------+--------+--------+------------------+--------+ +----------+--------+--------+------------+-------------------+           PSV cm/sEDV cm/sDescribe    Arm Pressure (mmHG) +----------+--------+--------+------------+-------------------+ Subclavian                Not assessed                    +----------+--------+--------+------------+-------------------+ +---------+--------+--+--------+--+---------+ VertebralPSV cm/s44EDV cm/s15Antegrade +---------+--------+--+--------+--+---------+  Summary: Right Carotid: Velocities in the right ICA are consistent with a 1-39% stenosis. Left Carotid: Velocities in the left ICA are consistent with a 1-39% stenosis. Vertebrals:  Bilateral vertebral arteries demonstrate antegrade flow. Subclavians: Normal flow hemodynamics were seen in the right subclavian artery.              Left subclavian artery not assessed. *See table(s) above for measurements and observations.  Electronically signed by Harold Barban MD on 10/12/2019 at 5:39:15 PM.    Final     PHYSICAL EXAM  Temp:  [97.5  F (36.4 C)-99.4 F (37.4 C)] 98.2 F (36.8 C) (11/05 1149) Pulse Rate:  [55-76] 76 (11/05 1149) Resp:  [12-23] 17 (11/05 1149) BP: (102-142)/(63-86) 117/75 (11/05 1149) SpO2:  [94 %-100 %] 100 % (11/05 1149)  General - Well nourished, well developed, in no apparent distress.  Ophthalmologic - fundi not visualized due to  noncooperation.  Cardiovascular - Regular rhythm and rate.  Mental Status -  Level of arousal and orientation to time, place, and person were intact. Language including expression, naming, repetition, comprehension was assessed and found intact.  Cranial Nerves II - XII - II - Visual field intact OU. III, IV, VI - Extraocular movements intact. V - Facial sensation intact bilaterally. VII - Facial movement intact bilaterally. VIII - Hearing & vestibular intact bilaterally. X - Palate elevates symmetrically. XI - Chin turning & shoulder shrug intact bilaterally. XII - Tongue protrusion intact.  Motor Strength - The patient's strength was normal in all extremities except RLE 5-/5 proximal and pronator drift was absent.  Bulk was normal and fasciculations were absent.   Motor Tone - Muscle tone was assessed at the neck and appendages and was normal.  Reflexes - The patient's reflexes were symmetrical in all extremities and she had no pathological reflexes.  Sensory - Light touch, temperature/pinprick were assessed and were symmetrical.    Coordination - The patient had normal movements in the hands with no ataxia or dysmetria.  Tremor was absent.  Gait and Station - deferred.   ASSESSMENT/PLAN Ms. DEARI WEMPLE is a 48 y.o. female with history of prior cryptogenic stroke, DB, HTN, morbid obesity, partial complex sz and OSA presenting after a fall at home with RLE weakness, R leg numbness.   Stroke:   L ACA territory small patchy infarct, embolic pattern, could be secondary to cardiomyopathy  MRI  Posterior L frontal lobe infarct L ACA territory. Small vessel disease.   MRA  unremarkable  Carotid Doppler  B ICA 1-39% stenosis, VAs antegrade   2D Echo EF 35-40%  Card plans repeat TEE to look for clot Mon as or an OP if d/c'd   LDL 64  HgbA1c 6.5  Lovenox 40 mg sq daily for VTE prophylaxis  No antithrombotic prior to admission, now on aspirin 81 mg daily and  clopidogrel 75 mg daily. Continue DAPT at d/c x 3 weeks then aspirin alone   Therapy recommendations:  CIR  Disposition:  pending   Hx stroke/TIA  05/2017 presenting w/ LUE numbness.  MRI showed R perirolandic small infarct likely embolic source.  MRA unremarkable, LDL 82, A1c 7.4.  Carotid Doppler negative.  EF 40 to 45%. Discharged with aspirin and Lipitor 10. Had outpatient TEE unremarkable, no PFO.  Loop recorder placed 05/30/2017, no A. fib found, and removed 03/04/2018 at pt request (due to shocking sensation as per patient).    Card plans repeat TEE to look for clot on Monday as an an OP if d/c'd  Follows at [facility], no antiplatelet PTA  Cardiomyopathy  05/2017 EF 40 to 45%  Current admission EF 35 to 40%  Patient to strokes in 2018 and this admission all consistent with embolic source.  Loop recorder negative for A. fib.  Concerning for cardiomyopathy as the source of emboli  Patient has no cardiology follow-up as outpatient  Cardiology consulted for CHF management. Needs ischemic workup as OP.  Hypertension  Stable . Permissive hypertension (OK if < 220/120) but gradually normalize in 5-7 days . Long-term BP goal normotensive  Hyperlipidemia  Home meds:  lipitor 10  Now on lipitor 40  LDL 64, goal < 70  Continue statin at discharge  Diabetes type II Controlled  HgbA1c 6.5, goal < 7.0  CBGs  SSI   PCP follow-up  Partial complex sz d/o w/ intractable epilepsy  Followed by Dr. Jannifer Franklin at [facility]  Has been on multiple AEDs in the past, but not tolerating  on Lamictal 75 bid PTA  Breakthrough sz x 2 during the night  Loaded with Keppra 1000  Lamictal level pending    EEG without sz, diffuse encephalopathy  LTM EEG - frequent spikes at bilateral frontal, excessive beta, generalized continuous slowing  Increased lamictal to 100 bid  Not tolerating keppra, tegretol or vimpat in the past  Continue Lamictal and follow-up with Dr. Jannifer Franklin at [facility]  Discussed  with Dr. Hortense Ramal, pt may be candidate for epilepsy surgery. She will discuss with Dr. Jannifer Franklin  Other Stroke Risk Factors  Morbid Obesity, Body mass index is 49.13 kg/m., recommend weight loss, diet and exercise as appropriate   Migraines - followed by Dr. Jannifer Franklin  Obstructive sleep apnea, on CPAP Qhs   Other Active Problems  Bipolar 1 d/o on Abilify and lamotrigine  Mild mental retardation  Depression on Abilify  CKD stage 3, creatinine 1.52  Hospital day # 2  Neurology will sign off. Please call with questions. Pt will follow up with Dr. Henderson Baltimore, NP at [facility] on 11/09/19. Thanks for the consult.   Rosalin Hawking, MD PhD Stroke Neurology 10/14/2019 12:55 PM    To contact Stroke Continuity provider, please refer to [email or web address] After hours, contact General Neurology

## 2019-10-14 NOTE — Progress Notes (Signed)
LTM EEG discontinued - no skin breakdown at unhook.   

## 2019-10-14 NOTE — Progress Notes (Signed)
    [facility] has been requested to perform a transesophageal echocardiogram on Brandi Gomez for stroke.  After careful review of history and examination, the risks and benefits of transesophageal echocardiogram have been explained including risks of esophageal damage, perforation (1:10,000 risk), bleeding, pharyngeal hematoma as well as other potential complications associated with conscious sedation including aspiration, arrhythmia, respiratory failure and death. Alternatives to treatment were discussed, questions were answered. Patient is willing to proceed.   Pt is scheduled on Monday 10/18/19 at noon. NPO at [facility] Sunday night.  [place], [place]  10/14/2019 3:07 PM

## 2019-10-14 NOTE — Progress Notes (Signed)
Inpatient Rehabilitation Admissions Coordinator  Inpatient rehab consult received. I met with patient at bedside to discuss her PLOF as well as her wishes. Patient .lives at home with her friend, [name] and her daughter Lesly Rubenstein. They provide 24/7 supervision, Pt uses cane occasionally pta and has short term memory issues per daughter, Raford Pitcher, who I spoke with yesterday. I await further treatment with therapy today to assist with planing dispo needs. I will follow up today.  Danne Baxter, RN, MSN Rehab Admissions Coordinator [phone] 10/14/2019 11:31 AM

## 2019-10-14 NOTE — Procedures (Addendum)
Patient Name: Brandi Gomez  MRN: [id number]  Epilepsy Attending: Lora Havens  Referring Physician/Provider: Dr Rosalin Hawking Duration: 10/13/2019 1202 to 10/14/2019 1037  Patient history: 48 year old female with left ACA stroke and known partial complex epilepsy who had breakthrough seizures. EEG to evaluate for seizures.  Level of alertness: awake, asleep  AEDs during EEG study: LTG  Technical aspects: This EEG study was done with scalp electrodes positioned according to the 10-20 International system of electrode placement. Electrical activity was acquired at a sampling rate of 500Hz  and reviewed with a high frequency filter of 70Hz  and a low frequency filter of 1Hz . EEG data were recorded continuously and digitally stored.   DESCRIPTION: During awake state, the posterior dominant rhythm consists of 8-9 Hz activity of moderate voltage (25-35 uV) seen predominantly in posterior head regions, symmetric and reactive to eye opening and eye closing. Sleep was characterized sleep spindles (12-14Hz ) and 3-5hz  diffuse slowing. EEG also showed continuous generalized 4-6hz  theta-delta slowing admixed with an excessive amount of 15 to 18 Hz, 2-3 uV beta activity with irregular morphology distributed symmetrically and diffusely. Frequent independent left and right frontal spikes were seen, predominantly in sleep.  Hyperventilation and photic stimulation were not performed.  Abnormality - Spike, left and right frontal -Continuous slow, generalized -Excessive beta, generalized  IMPRESSION: This study showed evidence of epileptogenicity arising from left and right frontal region. Its possible that patient has a single focus with either left or right spread vs independent bilateral foci. Additionally, there is evidence of mild diffuse encephalopathy, nonspecific to etiology. No seizures were seen throughout the recording.        [name] [name]

## 2019-10-14 NOTE — PMR Pre-admission (Signed)
PMR Admission Coordinator Pre-Admission Assessment  Patient: Brandi Gomez is an 48 y.o., female MRN: [id number] DOB: 1971/01/25 Height: '5\' 1"'$  (154.9 cm) Weight: 117.9 kg  Insurance Information HMO: yes    PPO:      PCP:      IPA:      80/20:      OTHER: medicare advantage plan PRIMARY: [employer]      Policy#: [id number]      Subscriber: pt CM Name: Jolayne Haines      Phone#: [phone] ext. [phone]     Fax#: [phone] Pre-Cert#: [id number] approved for 7 days and f/u with [facility] ext [phone] fax [phone]      Employer:  Benefits:  Phone #: [phone]     Name: 11/6 Eff. Date: 12/09/2016     Deduct: none      Out of Pocket Max: [phone]      Life Max: none CIR: $295 co pay per day days 1 until 6      SNF: no co pay days 1 until 20; $178 co pay per day days 21 until 100 Outpatient: $10 to $40 per visit     Co-Pay: visits per medical neccesity Home Health: 100%      Co-Pay: visits per medical neccesity DME: 80%     Co-Pay: 20% Providers: in network   SECONDARY: Medicaid      Policy#: [id number]      Subscriber: pt  Passport one online confirmed active 11/6 MADQN  Medicaid Application Date:       Case Manager:  Disability Application Date:       Case Worker:   The "Data Collection Information Summary" for patients in Inpatient Rehabilitation Facilities with attached "Privacy Act [place] Records" was provided and verbally reviewed with: Patient and Family  Emergency Contact Information Contact Information    Name Relation Home Work [place], [name] Friend [phone]     Agnes Lawrence Daughter   [phone]      Current Medical History  Patient Admitting Diagnosis: CVA   History of Present Illness:  48 year old right-handed female with history of anxiety, bipolar disorder, diabetes mellitus, sleep apnea, chronic systolic and diastolic heart failure, CKD stage III, morbid obesity BMI 49.13, hypertension, mild mental retardation, seizure disorder  followed by Dr. Jannifer Franklin maintained on Lamictal, prior cryptogenic stroke with loop recorder placement 05/30/2017 showing no A. fib and removed 03/04/2018 at patient request as well as TEE at that time unremarkable no PFO.    Presented 10/12/2019 with right leg weakness.  MRI of the brain showed a 12 mm acute ischemic nonhemorrhagic posterior left frontal lobe infarction, left ACA distribution.  No associated hemorrhage or mass-effect.  MRA with no intracranial large vessel occlusion or proximal high-grade stenosis.  Echocardiogram with ejection fraction of 40%.  Patient with noted breakthrough seizures x2.  EEG showed no seizure activity but frequent bifrontal sharps and patient's Lamictal was increased to 100 mg twice daily   Neurology consulted maintain on aspirin and Plavix x3 weeks then aspirin alone for seizure prophylaxis.  Subcutaneous Lovenox for DVT prophylaxis.  Cardiology for TEE to be completed as an outpatient to assess for thrombus.  Patient did have a fall last evening 10/14/2019 attempting to get out of bed to sit in the chair no injury sustained.  Tolerating a regular consistency diet.   Complete NIHSS TOTAL: 1  Patient's medical record from [facility] has been reviewed by the rehabilitation admission coordinator and physician.  Past Medical History  Past Medical History:  Diagnosis Date  . Anemia   . Anxiety   . Bipolar 1 disorder ([place])   . Common migraine 05/19/2015  . Depression   . Diabetes mellitus, type II ([place])   . Hypertension   . Irritable bowel syndrome (IBS)   . Mild mental retardation   . Obesity   . Partial complex seizure disorder with intractable epilepsy ([place]) 05/12/2014  . Seizures ([place])    intractable, sz 08/23/17  . Sleep apnea   . Stroke ([place])   . Type II or unspecified type diabetes mellitus without mention of complication, not stated as uncontrolled     Family History   family history includes Bipolar disorder in her father; Diabetes in her daughter  and mother; Hypertension in her mother; Leukemia in her daughter; Ovarian cancer in her maternal aunt.  Prior Rehab/Hospitalizations Has the patient had prior rehab or hospitalizations prior to admission? Yes  Has the patient had major surgery during 100 days prior to admission? No   Current Medications  Current Facility-Administered Medications:  .  acetaminophen (TYLENOL) tablet 650 mg, 650 mg, Oral, Q4H PRN, 650 mg at 10/12/19 2323 **OR** acetaminophen (TYLENOL) 160 MG/5ML solution 650 mg, 650 mg, Per Tube, Q4H PRN **OR** acetaminophen (TYLENOL) suppository 650 mg, 650 mg, Rectal, Q4H PRN, Shela Leff, MD .  ARIPiprazole (ABILIFY) tablet 10 mg, 10 mg, Oral, Daily, Shela Leff, MD, 10 mg at 10/15/19 0907 .  aspirin EC tablet 81 mg, 81 mg, Oral, Daily, Rosalin Hawking, MD, 81 mg at 10/15/19 0908 .  atorvastatin (LIPITOR) tablet 40 mg, 40 mg, Oral, q1800, Rosalin Hawking, MD, 40 mg at 10/14/19 1717 .  clopidogrel (PLAVIX) tablet 75 mg, 75 mg, Oral, Daily, Rosalin Hawking, MD, 75 mg at 10/15/19 0908 .  enoxaparin (LOVENOX) injection 40 mg, 40 mg, Subcutaneous, Q24H, Shela Leff, MD, 40 mg at 10/15/19 0908 .  hydrALAZINE (APRESOLINE) tablet 10 mg, 10 mg, Oral, BID, Skeet Latch, MD, 10 mg at 10/15/19 0907 .  insulin aspart (novoLOG) injection 0-5 Units, 0-5 Units, Subcutaneous, QHS, Rathore, Vasundhra, MD .  insulin aspart (novoLOG) injection 0-9 Units, 0-9 Units, Subcutaneous, TID WC, Shela Leff, MD, 1 Units at 10/15/19 1230 .  isosorbide dinitrate (ISORDIL) tablet 10 mg, 10 mg, Oral, BID, Skeet Latch, MD, 10 mg at 10/15/19 0907 .  lamoTRIgine (LAMICTAL) tablet 100 mg, 100 mg, Oral, BID, Rosalin Hawking, MD, 100 mg at 10/15/19 0907 .  polyethylene glycol (MIRALAX / GLYCOLAX) packet 17 g, 17 g, Oral, Daily, Kc, Ramesh, MD, 17 g at 10/15/19 1055 .  senna-docusate (Senokot-S) tablet 1 tablet, 1 tablet, Oral, QHS PRN, Shela Leff, MD, 1 tablet at 10/15/19 0151 .   Vitamin D (Ergocalciferol) (DRISDOL) capsule 50,000 Units, 50,000 Units, Oral, Q Carolee Rota, MD, 50,000 Units at 10/12/19 6144 .  zolpidem (AMBIEN) tablet 5 mg, 5 mg, Oral, QHS PRN, Antonieta Pert, MD  Patients Current Diet:  Diet Order            Diet Carb Modified Fluid consistency: Thin; Room service appropriate? Yes  Diet effective now              Precautions / Restrictions Precautions Precautions: Fall Restrictions Weight Bearing Restrictions: No   Has the patient had 2 or more falls or a fall with injury in the past year? No Fell 11/5 in hospital from recliner at bedside attempting to get up unassisted  Prior Activity Level Limited Community (1-2x/wk): Mod I with cane; does not drive AHort  term memory issues pta as well as mild MR per chart  Prior Functional Level Self Care: Did the patient need help bathing, dressing, using the toilet or eating? Independent  Indoor Mobility: Did the patient need assistance with walking from room to room (with or without device)? Independent  Stairs: Did the patient need assistance with internal or external stairs (with or without device)? Independent  Functional Cognition: Did the patient need help planning regular tasks such as shopping or remembering to take medications? Needed some help  Home Assistive Devices / [place] Devices/Equipment: None Home Equipment: None  Prior Device Use: Indicate devices/aids used by the patient prior to current illness, exacerbation or injury? cane as needed  Current Functional Level Cognition  Arousal/Alertness: Awake/alert Overall Cognitive Status: No family/caregiver present to determine baseline cognitive functioning Current Attention Level: Sustained Orientation Level: Oriented X4 Following Commands: Follows one step commands with increased time, Follows multi-step commands inconsistently Safety/Judgement: Decreased awareness of safety General Comments: A&Ox4,  increased time to respond to questions but responds appropriately. Pt very pleasant and conversive today, states she had a fall last night when prompted by PT, pt reports no injury or pain due to fall. Attention: Selective Selective Attention: Appears intact Memory: Appears intact(intact for recall of current symptoms and current situation/) Awareness: Appears intact Problem Solving: Appears intact Safety/Judgment: Appears intact    Extremity Assessment (includes Sensation/Coordination)  Upper Extremity Assessment: Defer to OT evaluation RUE Deficits / Details: grossly 3-/5;R weaker than L;slower rapid alternative movements; required finger to thumb one hand at a time for increased speed, attempted both hands at the same time and demonstrated increased difficulty RUE Sensation: WNL RUE Coordination: WNL LUE Deficits / Details: grossly 3/5 LUE Sensation: WNL LUE Coordination: WNL  Lower Extremity Assessment: RLE deficits/detail, LLE deficits/detail RLE Deficits / Details: knee ext 3+/5, R knee buckling occaisonally in standing, as much a neurological issue as strength. Hip flex 3/5, knee flex 4/5 RLE Sensation: WNL RLE Coordination: decreased gross motor LLE Deficits / Details: grossly 4+/5 throughout LLE Sensation: WNL LLE Coordination: WNL    ADLs  Overall ADL's : Needs assistance/impaired Eating/Feeding: Set up, Sitting Grooming: Set up, Sitting Upper Body Bathing: Min guard, Sitting Lower Body Bathing: Maximal assistance, Sitting/lateral leans Lower Body Bathing Details (indicate cue type and reason): maxA to access BLE due to large body habitus Upper Body Dressing : Minimal assistance, Sitting Lower Body Dressing: Maximal assistance, Sitting/lateral leans Lower Body Dressing Details (indicate cue type and reason): due to large body habitus Toilet Transfer: Moderate assistance, +2 for safety/equipment, Minimal assistance Toilet Transfer Details (indicate cue type and reason):  minA+2 for safety for stand pivot transfer progressing to modA +2 for ambulation for simulated toilet transfer with 1 LOB requiring increased support to correct Toileting- Clothing Manipulation and Hygiene: Moderate assistance, +2 for safety/equipment Functional mobility during ADLs: Moderate assistance, Minimal assistance, +2 for safety/equipment, +2 for physical assistance General ADL Comments: decreased activity tolerance, weakness, cognitive limitations, and decreased stability impacting safety with ADL completion     Mobility  Overal bed mobility: Needs Assistance Bed Mobility: Supine to Sit Rolling: Supervision Sidelying to sit: Min guard, HOB elevated General bed mobility comments: pt up in chair upon PT arrival, requests back to chair post-PT.    Transfers  Overall transfer level: Needs assistance Equipment used: Rolling walker (2 wheeled) Transfers: Sit to/from Stand Sit to Stand: +2 safety/equipment, Min guard Stand pivot transfers: Min assist, +2 safety/equipment General transfer comment: Min guard for safety, verbal  cuing for hand placement when rising for each sit<>stand.    Ambulation / Gait / Stairs / Wheelchair Mobility  Ambulation/Gait Ambulation/Gait assistance: Counsellor (Feet): 70 Feet(10+60) Assistive device: Rolling walker (2 wheeled) Gait Pattern/deviations: Step-through pattern, Trunk flexed General Gait Details: Min guard for safety, verbal cuing for upright posture, placement in RW, safe use of RW and avoiding hallway/room obstacles. Gait velocity: decr Gait velocity interpretation: <1.31 ft/sec, indicative of household ambulator    Posture / Balance Dynamic Sitting Balance Sitting balance - Comments: no physical A needed at EOB. Balance Overall balance assessment: Needs assistance Sitting-balance support: No upper extremity supported, Feet supported Sitting balance-Leahy Scale: Fair Sitting balance - Comments: no physical A needed at  EOB. Standing balance support: Bilateral upper extremity supported Standing balance-Leahy Scale: Poor Standing balance comment: requires UE support for safety and balance    Special needs/care consideration BiPAP/CPAP  Yes pta CPM  N/a Continuous Drip IV  N/a Dialysis n/a Life Vest  N/a Oxygen n/a Special Bed  Seizure precautions Trach Size  N/a Wound Vac n/a Skin   intact Bowel mgmt:  LBM 11/2 continent Bladder mgmt: external catheter Diabetic mgmt: Hgb A1c 6.5 type II Behavioral consideration Bipolar on Abilify and lamotrigine, mild mentally handicapped, depression Chemo/radiation n/a Designated visitor is [name]; agreed upon by pt, [name] and daughter, Raford Pitcher 11/6 pta       Patient fell in hospital 11/5 getting      up from recliner unasissted Previous Home Environment  Living Arrangements: (;ives with daughter, Denton Ar and friend, [name])  Lives With: Significant other, Daughter Available Help at Discharge: Family, Friend(s), Available 24 hours/day Type of Home: House Home Layout: One level Home Access: Stairs to enter Entrance Stairs-Rails: Right, Left Entrance Stairs-Number of Steps: 4-5 Bathroom Shower/Tub: Chiropodist: Standard Bathroom Accessibility: Yes How Accessible: Accessible via walker Home Care Services: No Additional Comments: pt lives with her daughter and a friend   Discharge Living Setting Plans for Discharge Living Setting: Patient's home, Lives with (comment)(daughter, Neurosurgeon and friend, [name]) Type of Home at Discharge: House Discharge Home Layout: One level Discharge Home Access: Stairs to enter Entrance Stairs-Rails: Right, Left Entrance Stairs-Number of Steps: 4 to 5 Discharge Bathroom Shower/Tub: Tub/shower unit, Curtain Discharge Bathroom Toilet: Standard Discharge Bathroom Accessibility: Yes How Accessible: Accessible via walker Does the patient have any problems obtaining your medications?:  No  Social/Family/Support Systems Patient Roles: Partner, Parent(3 children) Anticipated Caregiver: daughter Denton Ar and friend, [name] Anticipated Ambulance person Information: see above Caregiver Availability: 24/7 Discharge Plan Discussed with Primary Caregiver: Yes Is Caregiver In Agreement with Plan?: Yes Does Caregiver/Family have Issues with Lodging/Transportation while Pt is in Rehab?: No  Goals/Additional Needs Patient/Family Goal for Rehab: Mod I to supervision with PT, OT, and SLP Special Service Needs: seizure precautions Pt/Family Agrees to Admission and willing to participate: Yes Program Orientation Provided & Reviewed with Pt/Caregiver Including Roles  & Responsibilities: Yes  Decrease burden of Care through IP rehab admission: n/a  Possible need for SNF placement upon discharge: not anticipated  Patient Condition: I have reviewed medical records from [facility], spoken with  patient and daughter. I met with patient at the bedside for inpatient rehabilitation assessment.  Patient will benefit from ongoing PT, OT and SLP, can actively participate in 3 hours of therapy a day 5 days of the week, and can make measurable gains during the admission.  Patient will also benefit from the coordinated team approach during an Inpatient Acute Rehabilitation  admission.  The patient will receive intensive therapy as well as Rehabilitation physician, nursing, social worker, and care management interventions.  Due to bladder management, bowel management, safety, skin/wound care, disease management, medication administration, pain management and patient education the patient requires 24 hour a day rehabilitation nursing.  The patient is currently min to min guard ssist with mobility and basic ADLs.  Discharge setting and therapy post discharge at home with home health is anticipated.  Patient has agreed to participate in the Acute Inpatient Rehabilitation Program and will admit  today.  Preadmission Screen Completed By:  Cleatrice Burke, 10/15/2019 2:23 PM ______________________________________________________________________   Discussed status with Dr. Ranell Patrick on 10/15/2019 at  1423 and received approval for admission today.  Admission Coordinator:  Cleatrice Burke, RN, time  3312 Date  10/15/2019   Assessment/Plan: Diagnosis: 1. Does the need for close, 24 hr/day Medical supervision in concert with the patient's rehab needs make it unreasonable for this patient to be served in a less intensive setting? Yes 2. Co-Morbidities requiring supervision/potential complications: CKD Stage 3, type 2 DM, bipolar disorder, HTN, HLD 3. Due to bowel management, safety, skin/wound care, disease management, medication administration, pain management and patient education, does the patient require 24 hr/day rehab nursing? Yes 4. Does the patient require coordinated care of a physician, rehab nurse, PT, OT, and SLP to address physical and functional deficits in the context of the above medical diagnosis(es)? Yes Addressing deficits in the following areas: balance, endurance, locomotion, strength, transferring, bowel/bladder control, bathing, dressing, feeding, grooming, toileting, cognition and psychosocial support 5. Can the patient actively participate in an intensive therapy program of at least 3 hrs of therapy 5 days a week? Yes 6. The potential for patient to make measurable gains while on inpatient rehab is good 7. Anticipated functional outcomes upon discharge from inpatient rehab: modified independent PT, modified independent OT, independent SLP 8. Estimated rehab length of stay to reach the above functional goals is: 2 weeks 9. Anticipated discharge destination: Home 10. Overall Rehab/Functional Prognosis: good   MD Signature: Leeroy Cha, MD

## 2019-10-14 NOTE — Progress Notes (Signed)
Inpatient Rehabilitation Admissions Coordinator  Patient agrees that she would like to admit to inpt rehab . I have begun insurance authorization with [facility] for a possible admit pending medical workup completion, Noted TEE planned for Monday which is a barrier. We do not do TEE once admitted to CIR, therefore if insurance approves admit, we would not admit patient until after TEE completed. Please advise.  Danne Baxter, RN, MSN Rehab Admissions Coordinator [phone] 10/14/2019 3:57 PM

## 2019-10-15 ENCOUNTER — Encounter (HOSPITAL_COMMUNITY): Payer: Self-pay | Admitting: Nurse Practitioner

## 2019-10-15 ENCOUNTER — Other Ambulatory Visit: Payer: Self-pay

## 2019-10-15 ENCOUNTER — Inpatient Hospital Stay (HOSPITAL_COMMUNITY)
Admission: RE | Admit: 2019-10-15 | Discharge: 2019-10-20 | DRG: 057 | Disposition: A | Discharge status: Home Health | Payer: Medicare HMO | Source: Intra-hospital | Attending: Physical Medicine & Rehabilitation | Admitting: Physical Medicine & Rehabilitation

## 2019-10-15 DIAGNOSIS — I69351 Hemiplegia and hemiparesis following cerebral infarction affecting right dominant side: Principal | ICD-10-CM

## 2019-10-15 DIAGNOSIS — Z7982 Long term (current) use of aspirin: Secondary | ICD-10-CM

## 2019-10-15 DIAGNOSIS — I5042 Chronic combined systolic (congestive) and diastolic (congestive) heart failure: Secondary | ICD-10-CM | POA: Diagnosis not present

## 2019-10-15 DIAGNOSIS — Z6841 Body Mass Index (BMI) 40.0 and over, adult: Secondary | ICD-10-CM | POA: Diagnosis not present

## 2019-10-15 DIAGNOSIS — F319 Bipolar disorder, unspecified: Secondary | ICD-10-CM | POA: Diagnosis present

## 2019-10-15 DIAGNOSIS — M545 Low back pain: Secondary | ICD-10-CM | POA: Diagnosis not present

## 2019-10-15 DIAGNOSIS — N183 Chronic kidney disease, stage 3 unspecified: Secondary | ICD-10-CM | POA: Diagnosis present

## 2019-10-15 DIAGNOSIS — Z888 Allergy status to other drugs, medicaments and biological substances status: Secondary | ICD-10-CM

## 2019-10-15 DIAGNOSIS — Z806 Family history of leukemia: Secondary | ICD-10-CM | POA: Diagnosis not present

## 2019-10-15 DIAGNOSIS — G4700 Insomnia, unspecified: Secondary | ICD-10-CM | POA: Diagnosis present

## 2019-10-15 DIAGNOSIS — Z8249 Family history of ischemic heart disease and other diseases of the circulatory system: Secondary | ICD-10-CM

## 2019-10-15 DIAGNOSIS — Z7984 Long term (current) use of oral hypoglycemic drugs: Secondary | ICD-10-CM

## 2019-10-15 DIAGNOSIS — I429 Cardiomyopathy, unspecified: Secondary | ICD-10-CM | POA: Diagnosis present

## 2019-10-15 DIAGNOSIS — Z79899 Other long term (current) drug therapy: Secondary | ICD-10-CM

## 2019-10-15 DIAGNOSIS — E1142 Type 2 diabetes mellitus with diabetic polyneuropathy: Secondary | ICD-10-CM | POA: Diagnosis not present

## 2019-10-15 DIAGNOSIS — E1122 Type 2 diabetes mellitus with diabetic chronic kidney disease: Secondary | ICD-10-CM | POA: Diagnosis present

## 2019-10-15 DIAGNOSIS — F70 Mild intellectual disabilities: Secondary | ICD-10-CM | POA: Diagnosis present

## 2019-10-15 DIAGNOSIS — R569 Unspecified convulsions: Secondary | ICD-10-CM | POA: Diagnosis not present

## 2019-10-15 DIAGNOSIS — G40909 Epilepsy, unspecified, not intractable, without status epilepticus: Secondary | ICD-10-CM | POA: Diagnosis present

## 2019-10-15 DIAGNOSIS — Z880 Allergy status to penicillin: Secondary | ICD-10-CM

## 2019-10-15 DIAGNOSIS — I639 Cerebral infarction, unspecified: Secondary | ICD-10-CM | POA: Diagnosis present

## 2019-10-15 DIAGNOSIS — Z833 Family history of diabetes mellitus: Secondary | ICD-10-CM

## 2019-10-15 DIAGNOSIS — G4733 Obstructive sleep apnea (adult) (pediatric): Secondary | ICD-10-CM | POA: Diagnosis not present

## 2019-10-15 DIAGNOSIS — E785 Hyperlipidemia, unspecified: Secondary | ICD-10-CM | POA: Diagnosis present

## 2019-10-15 DIAGNOSIS — K5900 Constipation, unspecified: Secondary | ICD-10-CM | POA: Diagnosis present

## 2019-10-15 DIAGNOSIS — E559 Vitamin D deficiency, unspecified: Secondary | ICD-10-CM | POA: Diagnosis present

## 2019-10-15 DIAGNOSIS — I130 Hypertensive heart and chronic kidney disease with heart failure and stage 1 through stage 4 chronic kidney disease, or unspecified chronic kidney disease: Secondary | ICD-10-CM | POA: Diagnosis not present

## 2019-10-15 DIAGNOSIS — E119 Type 2 diabetes mellitus without complications: Secondary | ICD-10-CM

## 2019-10-15 DIAGNOSIS — I10 Essential (primary) hypertension: Secondary | ICD-10-CM | POA: Diagnosis not present

## 2019-10-15 LAB — CREATININE, SERUM
Creatinine, Ser: 1.31 mg/dL — ABNORMAL HIGH (ref 0.44–1.00)
GFR calc Af Amer: 55 mL/min — ABNORMAL LOW (ref >60)
GFR calc non Af Amer: 47 mL/min — ABNORMAL LOW (ref >60)

## 2019-10-15 LAB — GLUCOSE, CAPILLARY
Glucose-Capillary: 110 mg/dL — ABNORMAL HIGH (ref 70–99)
Glucose-Capillary: 105 mg/dL — ABNORMAL HIGH (ref 70–99)
Glucose-Capillary: 152 mg/dL — ABNORMAL HIGH (ref 70–99)

## 2019-10-15 LAB — CBC
HCT: 31.4 % — ABNORMAL LOW (ref 36.0–46.0)
Hemoglobin: 9.7 g/dL — ABNORMAL LOW (ref 12.0–15.0)
MCH: 28 pg (ref 26.0–34.0)
MCHC: 30.9 g/dL (ref 30.0–36.0)
MCV: 90.5 fL (ref 80.0–100.0)
Platelets: 317 K/uL (ref 150–400)
RBC: 3.47 MIL/uL — ABNORMAL LOW (ref 3.87–5.11)
RDW: 14.4 % (ref 11.5–15.5)
WBC: 8.8 K/uL (ref 4.0–10.5)
nRBC: 0 % (ref 0.0–0.2)

## 2019-10-15 MED ORDER — CLOPIDOGREL BISULFATE 75 MG PO TABS
75.0000 mg | ORAL_TABLET | Freq: Every day | ORAL | Status: DC
  Administered 2019-10-16 – 2019-10-20 (×5): 75 mg via ORAL
  Filled 2019-10-15 (×5): qty 1

## 2019-10-15 MED ORDER — POLYETHYLENE GLYCOL 3350 17 G PO PACK
17.0000 g | PACK | Freq: Every day | ORAL | Status: DC
  Administered 2019-10-18 – 2019-10-19 (×2): 17 g via ORAL
  Filled 2019-10-15 (×4): qty 1

## 2019-10-15 MED ORDER — POLYETHYLENE GLYCOL 3350 17 G PO PACK: 17.0000 g | PACK | Freq: Every day | ORAL | 0 refills | Status: DC

## 2019-10-15 MED ORDER — ACETAMINOPHEN 160 MG/5ML PO SOLN: 650.0000 mg | ORAL | Status: DC | PRN

## 2019-10-15 MED ORDER — ISOSORBIDE DINITRATE 10 MG PO TABS: 10.0000 mg | ORAL_TABLET | Freq: Two times a day (BID) | ORAL | Status: DC

## 2019-10-15 MED ORDER — VITAMIN D (ERGOCALCIFEROL) 1.25 MG (50000 UNIT) PO CAPS
50000.0000 [IU] | ORAL_CAPSULE | ORAL | Status: DC
  Administered 2019-10-19: 50000 [IU] via ORAL
  Filled 2019-10-15: qty 1

## 2019-10-15 MED ORDER — ATORVASTATIN CALCIUM 40 MG PO TABS: 40.0000 mg | ORAL_TABLET | Freq: Every day | ORAL | Status: DC

## 2019-10-15 MED ORDER — INSULIN ASPART 100 UNIT/ML ~~LOC~~ SOLN
0.0000 [IU] | Freq: Three times a day (TID) | SUBCUTANEOUS | Status: DC
  Administered 2019-10-17 – 2019-10-18 (×2): 1 [IU] via SUBCUTANEOUS

## 2019-10-15 MED ORDER — LAMOTRIGINE 100 MG PO TABS
100.0000 mg | ORAL_TABLET | Freq: Two times a day (BID) | ORAL | Status: DC
  Administered 2019-10-15 – 2019-10-20 (×10): 100 mg via ORAL
  Filled 2019-10-15 (×11): qty 1

## 2019-10-15 MED ORDER — ISOSORBIDE DINITRATE 10 MG PO TABS
10.0000 mg | ORAL_TABLET | Freq: Two times a day (BID) | ORAL | Status: DC
  Administered 2019-10-15 – 2019-10-20 (×10): 10 mg via ORAL
  Filled 2019-10-15 (×11): qty 1

## 2019-10-15 MED ORDER — ZOLPIDEM TARTRATE 5 MG PO TABS: 5.0000 mg | ORAL_TABLET | Freq: Every evening | ORAL | Status: DC | PRN

## 2019-10-15 MED ORDER — ASPIRIN EC 81 MG PO TBEC
81.0000 mg | DELAYED_RELEASE_TABLET | Freq: Every day | ORAL | Status: DC
  Administered 2019-10-16 – 2019-10-20 (×5): 81 mg via ORAL
  Filled 2019-10-15 (×5): qty 1

## 2019-10-15 MED ORDER — ZOLPIDEM TARTRATE 5 MG PO TABS: 5.0000 mg | ORAL_TABLET | Freq: Every evening | ORAL | 0 refills | Status: DC | PRN

## 2019-10-15 MED ORDER — ARIPIPRAZOLE 5 MG PO TABS
10.0000 mg | ORAL_TABLET | Freq: Every day | ORAL | Status: DC
  Administered 2019-10-16 – 2019-10-20 (×5): 10 mg via ORAL
  Filled 2019-10-15 (×5): qty 2

## 2019-10-15 MED ORDER — ASPIRIN 81 MG PO TBEC: 81.0000 mg | DELAYED_RELEASE_TABLET | Freq: Every day | ORAL | Status: DC

## 2019-10-15 MED ORDER — ACETAMINOPHEN 650 MG RE SUPP: 650.0000 mg | RECTAL | Status: DC | PRN

## 2019-10-15 MED ORDER — SENNOSIDES-DOCUSATE SODIUM 8.6-50 MG PO TABS: 1.0000 | ORAL_TABLET | Freq: Every evening | ORAL | Status: DC | PRN

## 2019-10-15 MED ORDER — HYDRALAZINE HCL 10 MG PO TABS: 10.0000 mg | ORAL_TABLET | Freq: Two times a day (BID) | ORAL | Status: DC

## 2019-10-15 MED ORDER — ATORVASTATIN CALCIUM 40 MG PO TABS
40.0000 mg | ORAL_TABLET | Freq: Every day | ORAL | Status: DC
  Administered 2019-10-15 – 2019-10-19 (×5): 40 mg via ORAL
  Filled 2019-10-15 (×5): qty 1

## 2019-10-15 MED ORDER — HYDRALAZINE HCL 10 MG PO TABS
10.0000 mg | ORAL_TABLET | Freq: Two times a day (BID) | ORAL | Status: DC
  Administered 2019-10-15 – 2019-10-20 (×10): 10 mg via ORAL
  Filled 2019-10-15 (×10): qty 1

## 2019-10-15 MED ORDER — POLYETHYLENE GLYCOL 3350 17 G PO PACK
17.0000 g | PACK | Freq: Every day | ORAL | Status: DC
  Administered 2019-10-15: 17 g via ORAL
  Filled 2019-10-15: qty 1

## 2019-10-15 MED ORDER — LAMOTRIGINE 100 MG PO TABS: 100.0000 mg | ORAL_TABLET | Freq: Two times a day (BID) | ORAL | Status: DC

## 2019-10-15 MED ORDER — ACETAMINOPHEN 325 MG PO TABS
650.0000 mg | ORAL_TABLET | ORAL | Status: DC | PRN
  Filled 2019-10-15: qty 2

## 2019-10-15 MED ORDER — ENOXAPARIN SODIUM 40 MG/0.4ML ~~LOC~~ SOLN
40.0000 mg | SUBCUTANEOUS | Status: DC
  Administered 2019-10-16 – 2019-10-20 (×5): 40 mg via SUBCUTANEOUS
  Filled 2019-10-15 (×5): qty 0.4

## 2019-10-15 MED ORDER — TRAZODONE HCL 50 MG PO TABS
25.0000 mg | ORAL_TABLET | Freq: Every evening | ORAL | Status: DC | PRN
  Administered 2019-10-15 – 2019-10-18 (×4): 50 mg via ORAL
  Filled 2019-10-15 (×4): qty 1

## 2019-10-15 MED ORDER — CLOPIDOGREL BISULFATE 75 MG PO TABS: 75.0000 mg | ORAL_TABLET | Freq: Every day | ORAL | 0 refills | Status: DC

## 2019-10-15 MED ORDER — ENOXAPARIN SODIUM 40 MG/0.4ML ~~LOC~~ SOLN: 40.0000 mg | SUBCUTANEOUS | Status: DC

## 2019-10-15 NOTE — Progress Notes (Signed)
Patient ID: Brandi Gomez, female   DOB: 16-Jun-1971, 48 y.o.   MRN: [id number] Patient was admitted to 4W04 via bed, escorted by nursing staff.  Patient verbalized understanding of rehab process including fall prevention policy, personal belongings policy, and visitation policy.  Patient appears to be in no immediate distress at this time.  Brita Romp, RN

## 2019-10-15 NOTE — Progress Notes (Signed)
Brandi Ribas, MD  Physician  Physical Medicine and Rehabilitation  PMR Pre-admission  Signed  Date of Service:  10/14/2019 12:08 PM      Related encounter: ED to Hosp-Admission (Discharged) from 10/11/2019 in [facility]      Signed         Show:Clear all '[x]'$ Manual'[x]'$ Template'[x]'$ Copied  Added by: '[x]'$ Cristina Gong, RN'[x]'$ Raulkar, Clide Deutscher, MD  '[]'$ Hover for details PMR Admission Coordinator Pre-Admission Assessment  Patient: Brandi Gomez is an 48 y.o., female MRN: [id number] DOB: October 06, 1971 Height: '5\' 1"'$  (154.9 cm) Weight: 117.9 kg  Insurance Information HMO: yes    PPO:      PCP:      IPA:      80/20:      OTHER: medicare advantage plan PRIMARY: [employer]      Policy#: [id number]      Subscriber: pt CM Name: Jolayne Haines      Phone#: [phone] ext. [phone]     Fax#: [phone] Pre-Cert#: [id number] approved for 7 days and f/u with [facility] ext [phone] fax [phone]      Employer:  Benefits:  Phone #: [phone]     Name: 11/6 Eff. Date: 12/09/2016     Deduct: none      Out of Pocket Max: [phone]      Life Max: none CIR: $295 co pay per day days 1 until 6      SNF: no co pay days 1 until 20; $178 co pay per day days 21 until 100 Outpatient: $10 to $40 per visit     Co-Pay: visits per medical neccesity Home Health: 100%      Co-Pay: visits per medical neccesity DME: 80%     Co-Pay: 20% Providers: in network   SECONDARY: Medicaid      Policy#: [id number]      Subscriber: pt  Passport one online confirmed active 11/6 MADQN  Medicaid Application Date:       Case Manager:  Disability Application Date:       Case Worker:   The "Data Collection Information Summary" for patients in Inpatient Rehabilitation Facilities with attached "Privacy Act [place] Records" was provided and verbally reviewed with: Patient and Family  Emergency Contact Information         Contact Information    Name Relation Home Work [name], [name] Friend [phone]     Agnes Lawrence Daughter   [phone]      Current Medical History  Patient Admitting Diagnosis: CVA   History of Present Illness:  48 year old right-handed female with history of anxiety, bipolar disorder, diabetes mellitus, sleep apnea, chronic systolic and diastolic heart failure, CKD stage III, morbid obesity BMI 49.13, hypertension, mild mental retardation, seizure disorder followed by Dr. Jannifer Franklin maintained on Lamictal, prior cryptogenic stroke with loop recorder placement 05/30/2017 showing no A. fib and removed 03/04/2018 at patient request as well as TEE at that time unremarkable no PFO.  Presented 10/12/2019 with right leg weakness. MRI of the brain showed a 12 mm acute ischemic nonhemorrhagic posterior left frontal lobe infarction, left ACA distribution. No associated hemorrhage or mass-effect. MRA with no intracranial large vessel occlusion or proximal high-grade stenosis. Echocardiogram with ejection fraction of 40%. Patient with noted breakthrough seizures x2. EEG showed no seizure activity but frequent bifrontal sharps and patient's Lamictal was increased to 100 mg twice daily Neurology consulted maintain on aspirin and Plavix x3 weeks then aspirin alone for seizure prophylaxis. Subcutaneous Lovenox for DVT prophylaxis.  Cardiology for TEE to be completed as an outpatient to assess for thrombus. Patient did have a fall last evening 10/14/2019 attempting to get out of bed to sit in the chair no injury sustained. Tolerating a regular consistency diet.   Complete NIHSS TOTAL: 1  Patient's medical record from [facility] has been reviewed by the rehabilitation admission coordinator and physician.  Past Medical History      Past Medical History:  Diagnosis Date  . Anemia   . Anxiety   . Bipolar 1 disorder ([place])   . Common migraine 05/19/2015  . Depression   . Diabetes mellitus, type II ([place])   . Hypertension    . Irritable bowel syndrome (IBS)   . Mild mental retardation   . Obesity   . Partial complex seizure disorder with intractable epilepsy ([place]) 05/12/2014  . Seizures ([place])    intractable, sz 08/23/17  . Sleep apnea   . Stroke ([place])   . Type II or unspecified type diabetes mellitus without mention of complication, not stated as uncontrolled     Family History   family history includes Bipolar disorder in her father; Diabetes in her daughter and mother; Hypertension in her mother; Leukemia in her daughter; Ovarian cancer in her maternal aunt.  Prior Rehab/Hospitalizations Has the patient had prior rehab or hospitalizations prior to admission? Yes  Has the patient had major surgery during 100 days prior to admission? No              Current Medications  Current Facility-Administered Medications:  .  acetaminophen (TYLENOL) tablet 650 mg, 650 mg, Oral, Q4H PRN, 650 mg at 10/12/19 2323 **OR** acetaminophen (TYLENOL) 160 MG/5ML solution 650 mg, 650 mg, Per Tube, Q4H PRN **OR** acetaminophen (TYLENOL) suppository 650 mg, 650 mg, Rectal, Q4H PRN, Shela Leff, MD .  ARIPiprazole (ABILIFY) tablet 10 mg, 10 mg, Oral, Daily, Shela Leff, MD, 10 mg at 10/15/19 0907 .  aspirin EC tablet 81 mg, 81 mg, Oral, Daily, Rosalin Hawking, MD, 81 mg at 10/15/19 0908 .  atorvastatin (LIPITOR) tablet 40 mg, 40 mg, Oral, q1800, Rosalin Hawking, MD, 40 mg at 10/14/19 1717 .  clopidogrel (PLAVIX) tablet 75 mg, 75 mg, Oral, Daily, Rosalin Hawking, MD, 75 mg at 10/15/19 0908 .  enoxaparin (LOVENOX) injection 40 mg, 40 mg, Subcutaneous, Q24H, Shela Leff, MD, 40 mg at 10/15/19 0908 .  hydrALAZINE (APRESOLINE) tablet 10 mg, 10 mg, Oral, BID, Skeet Latch, MD, 10 mg at 10/15/19 0907 .  insulin aspart (novoLOG) injection 0-5 Units, 0-5 Units, Subcutaneous, QHS, Rathore, Vasundhra, MD .  insulin aspart (novoLOG) injection 0-9 Units, 0-9 Units, Subcutaneous, TID WC, Shela Leff, MD, 1 Units  at 10/15/19 1230 .  isosorbide dinitrate (ISORDIL) tablet 10 mg, 10 mg, Oral, BID, Skeet Latch, MD, 10 mg at 10/15/19 0907 .  lamoTRIgine (LAMICTAL) tablet 100 mg, 100 mg, Oral, BID, Rosalin Hawking, MD, 100 mg at 10/15/19 0907 .  polyethylene glycol (MIRALAX / GLYCOLAX) packet 17 g, 17 g, Oral, Daily, Kc, Ramesh, MD, 17 g at 10/15/19 1055 .  senna-docusate (Senokot-S) tablet 1 tablet, 1 tablet, Oral, QHS PRN, Shela Leff, MD, 1 tablet at 10/15/19 0151 .  Vitamin D (Ergocalciferol) (DRISDOL) capsule 50,000 Units, 50,000 Units, Oral, Q Carolee Rota, MD, 50,000 Units at 10/12/19 0937 .  zolpidem (AMBIEN) tablet 5 mg, 5 mg, Oral, QHS PRN, Antonieta Pert, MD  Patients Current Diet:     Diet Order  Diet Carb Modified Fluid consistency: Thin; Room service appropriate? Yes  Diet effective now               Precautions / Restrictions Precautions Precautions: Fall Restrictions Weight Bearing Restrictions: No   Has the patient had 2 or more falls or a fall with injury in the past year? No Fell 11/5 in hospital from recliner at bedside attempting to get up unassisted  Prior Activity Level Limited Community (1-2x/wk): Mod I with cane; does not drive AHort term memory issues pta as well as mild MR per chart  Prior Functional Level Self Care: Did the patient need help bathing, dressing, using the toilet or eating? Independent  Indoor Mobility: Did the patient need assistance with walking from room to room (with or without device)? Independent  Stairs: Did the patient need assistance with internal or external stairs (with or without device)? Independent  Functional Cognition: Did the patient need help planning regular tasks such as shopping or remembering to take medications? Needed some help  Home Assistive Devices / Garden Devices/Equipment: None Home Equipment: None  Prior Device Use: Indicate devices/aids used by the  patient prior to current illness, exacerbation or injury? cane as needed  Current Functional Level Cognition  Arousal/Alertness: Awake/alert Overall Cognitive Status: No family/caregiver present to determine baseline cognitive functioning Current Attention Level: Sustained Orientation Level: Oriented X4 Following Commands: Follows one step commands with increased time, Follows multi-step commands inconsistently Safety/Judgement: Decreased awareness of safety General Comments: A&Ox4, increased time to respond to questions but responds appropriately. Pt very pleasant and conversive today, states she had a fall last night when prompted by PT, pt reports no injury or pain due to fall. Attention: Selective Selective Attention: Appears intact Memory: Appears intact(intact for recall of current symptoms and current situation/) Awareness: Appears intact Problem Solving: Appears intact Safety/Judgment: Appears intact    Extremity Assessment (includes Sensation/Coordination)  Upper Extremity Assessment: Defer to OT evaluation RUE Deficits / Details: grossly 3-/5;R weaker than L;slower rapid alternative movements; required finger to thumb one hand at a time for increased speed, attempted both hands at the same time and demonstrated increased difficulty RUE Sensation: WNL RUE Coordination: WNL LUE Deficits / Details: grossly 3/5 LUE Sensation: WNL LUE Coordination: WNL  Lower Extremity Assessment: RLE deficits/detail, LLE deficits/detail RLE Deficits / Details: knee ext 3+/5, R knee buckling occaisonally in standing, as much a neurological issue as strength. Hip flex 3/5, knee flex 4/5 RLE Sensation: WNL RLE Coordination: decreased gross motor LLE Deficits / Details: grossly 4+/5 throughout LLE Sensation: WNL LLE Coordination: WNL    ADLs  Overall ADL's : Needs assistance/impaired Eating/Feeding: Set up, Sitting Grooming: Set up, Sitting Upper Body Bathing: Min guard, Sitting Lower  Body Bathing: Maximal assistance, Sitting/lateral leans Lower Body Bathing Details (indicate cue type and reason): maxA to access BLE due to large body habitus Upper Body Dressing : Minimal assistance, Sitting Lower Body Dressing: Maximal assistance, Sitting/lateral leans Lower Body Dressing Details (indicate cue type and reason): due to large body habitus Toilet Transfer: Moderate assistance, +2 for safety/equipment, Minimal assistance Toilet Transfer Details (indicate cue type and reason): minA+2 for safety for stand pivot transfer progressing to modA +2 for ambulation for simulated toilet transfer with 1 LOB requiring increased support to correct Toileting- Clothing Manipulation and Hygiene: Moderate assistance, +2 for safety/equipment Functional mobility during ADLs: Moderate assistance, Minimal assistance, +2 for safety/equipment, +2 for physical assistance General ADL Comments: decreased activity tolerance, weakness, cognitive limitations, and decreased stability impacting  safety with ADL completion     Mobility  Overal bed mobility: Needs Assistance Bed Mobility: Supine to Sit Rolling: Supervision Sidelying to sit: Min guard, HOB elevated General bed mobility comments: pt up in chair upon PT arrival, requests back to chair post-PT.    Transfers  Overall transfer level: Needs assistance Equipment used: Rolling walker (2 wheeled) Transfers: Sit to/from Stand Sit to Stand: +2 safety/equipment, Min guard Stand pivot transfers: Min assist, +2 safety/equipment General transfer comment: Min guard for safety, verbal cuing for hand placement when rising for each sit<>stand.    Ambulation / Gait / Stairs / Wheelchair Mobility  Ambulation/Gait Ambulation/Gait assistance: Counsellor (Feet): 70 Feet(10+60) Assistive device: Rolling walker (2 wheeled) Gait Pattern/deviations: Step-through pattern, Trunk flexed General Gait Details: Min guard for safety, verbal cuing for  upright posture, placement in RW, safe use of RW and avoiding hallway/room obstacles. Gait velocity: decr Gait velocity interpretation: <1.31 ft/sec, indicative of household ambulator    Posture / Balance Dynamic Sitting Balance Sitting balance - Comments: no physical A needed at EOB. Balance Overall balance assessment: Needs assistance Sitting-balance support: No upper extremity supported, Feet supported Sitting balance-Leahy Scale: Fair Sitting balance - Comments: no physical A needed at EOB. Standing balance support: Bilateral upper extremity supported Standing balance-Leahy Scale: Poor Standing balance comment: requires UE support for safety and balance    Special needs/care consideration BiPAP/CPAP  Yes pta CPM  N/a Continuous Drip IV  N/a Dialysis n/a Life Vest  N/a Oxygen n/a Special Bed  Seizure precautions Trach Size  N/a Wound Vac n/a Skin   intact Bowel mgmt:  LBM 11/2 continent Bladder mgmt: external catheter Diabetic mgmt: Hgb A1c 6.5 type II Behavioral consideration Bipolar on Abilify and lamotrigine, mild mentally handicapped, depression Chemo/radiation n/a Designated visitor is [name]; agreed upon by pt, [name] and daughter, Raford Pitcher 11/6 pta                                                              Patient fell in hospital 11/5 getting                                                      up from recliner unasissted Previous Home Environment  Living Arrangements: (;ives with daughter, Denton Ar and friend, [name])  Lives With: Significant other, Daughter Available Help at Discharge: Family, Friend(s), Available 24 hours/day Type of Home: House Home Layout: One level Home Access: Stairs to enter Entrance Stairs-Rails: Right, Left Entrance Stairs-Number of Steps: 4-5 Bathroom Shower/Tub: Chiropodist: Standard Bathroom Accessibility: Yes How Accessible: Accessible via walker Home Care Services: No Additional Comments: pt lives with  her daughter and a friend   Discharge Living Setting Plans for Discharge Living Setting: Patient's home, Lives with (comment)(daughter, Denton Ar and friend, [name]) Type of Home at Discharge: House Discharge Home Layout: One level Discharge Home Access: Stairs to enter Entrance Stairs-Rails: Right, Left Entrance Stairs-Number of Steps: 4 to 5 Discharge Bathroom Shower/Tub: Tub/shower unit, Curtain Discharge Bathroom Toilet: Standard Discharge Bathroom Accessibility: Yes How Accessible: Accessible via walker Does the patient have any problems obtaining your medications?: No  Social/Family/Support Systems Patient Roles: Partner, Parent(3 children) Anticipated Caregiver: daughter Denton Ar and friend, [name] Anticipated Ambulance person Information: see above Caregiver Availability: 24/7 Discharge Plan Discussed with Primary Caregiver: Yes Is Caregiver In Agreement with Plan?: Yes Does Caregiver/Family have Issues with Lodging/Transportation while Pt is in Rehab?: No  Goals/Additional Needs Patient/Family Goal for Rehab: Mod I to supervision with PT, OT, and SLP Special Service Needs: seizure precautions Pt/Family Agrees to Admission and willing to participate: Yes Program Orientation Provided & Reviewed with Pt/Caregiver Including Roles  & Responsibilities: Yes  Decrease burden of Care through IP rehab admission: n/a  Possible need for SNF placement upon discharge: not anticipated  Patient Condition: I have reviewed medical records from [facility], spoken with  patient and daughter. I met with patient at the bedside for inpatient rehabilitation assessment.  Patient will benefit from ongoing PT, OT and SLP, can actively participate in 3 hours of therapy a day 5 days of the week, and can make measurable gains during the admission.  Patient will also benefit from the coordinated team approach during an Inpatient Acute Rehabilitation admission.  The patient will receive  intensive therapy as well as Rehabilitation physician, nursing, social worker, and care management interventions.  Due to bladder management, bowel management, safety, skin/wound care, disease management, medication administration, pain management and patient education the patient requires 24 hour a day rehabilitation nursing.  The patient is currently min to min guard ssist with mobility and basic ADLs.  Discharge setting and therapy post discharge at home with home health is anticipated.  Patient has agreed to participate in the Acute Inpatient Rehabilitation Program and will admit today.  Preadmission Screen Completed By:  Cleatrice Burke, 10/15/2019 2:23 PM ______________________________________________________________________   Discussed status with Dr. Ranell Patrick on 10/15/2019 at  1423 and received approval for admission today.  Admission Coordinator:  Cleatrice Burke, RN, time  9528 Date  10/15/2019   Assessment/Plan: Diagnosis: 1. Does the need for close, 24 hr/day Medical supervision in concert with the patient's rehab needs make it unreasonable for this patient to be served in a less intensive setting? Yes 2. Co-Morbidities requiring supervision/potential complications: CKD Stage 3, type 2 DM, bipolar disorder, HTN, HLD 3. Due to bowel management, safety, skin/wound care, disease management, medication administration, pain management and patient education, does the patient require 24 hr/day rehab nursing? Yes 4. Does the patient require coordinated care of a physician, rehab nurse, PT, OT, and SLP to address physical and functional deficits in the context of the above medical diagnosis(es)? Yes Addressing deficits in the following areas: balance, endurance, locomotion, strength, transferring, bowel/bladder control, bathing, dressing, feeding, grooming, toileting, cognition and psychosocial support 5. Can the patient actively participate in an intensive therapy program of at  least 3 hrs of therapy 5 days a week? Yes 6. The potential for patient to make measurable gains while on inpatient rehab is good 7. Anticipated functional outcomes upon discharge from inpatient rehab: modified independent PT, modified independent OT, independent SLP 8. Estimated rehab length of stay to reach the above functional goals is: 2 weeks 9. Anticipated discharge destination: Home 10. Overall Rehab/Functional Prognosis: good   MD Signature: Leeroy Cha, MD        Revision History

## 2019-10-15 NOTE — Care Management Important Message (Signed)
Important Message  Patient Details  Name: MACI PALOS MRN: [id number] Date of Birth: May 15, 1971   Medicare Important Message Given:  Yes     Landy Dunnavant [address] 10/15/2019, 3:40 PM

## 2019-10-15 NOTE — H&P (Signed)
Physical Medicine and Rehabilitation Admission H&P    No chief complaint on file. : HPI: Brandi Gomez. Brandi Gomez is a 48 year old right-handed female with history of anxiety, bipolar disorder, diabetes mellitus, sleep apnea, chronic systolic and diastolic heart failure, CKD stage III, morbid obesity BMI 49.13, hypertension, mild mental retardation, seizure disorder followed by Dr. Jannifer Franklin maintained on Lamictal, prior cryptogenic stroke with loop recorder placement 05/30/2017 showing no A. fib and removed 03/04/2018 at patient request as well as TEE at that time unremarkable no PFO.  Per chart review patient lives with her daughter.  1 level home with 4-5 steps to entry.  Family assistance provided as needed.  Patient was independent with ADLs but did not cook due to some memory deficits.  Presented 10/12/2019 with right leg weakness.  MRI of the brain showed a 12 mm acute ischemic nonhemorrhagic posterior left frontal lobe infarction, left ACA distribution.  No associated hemorrhage or mass-effect.  MRA with no intracranial large vessel occlusion or proximal high-grade stenosis.  Echocardiogram with ejection fraction of 40%.  Patient with noted breakthrough seizures x2.  EEG showed no seizure activity but frequent bifrontal sharps and patient's Lamictal was increased to 100 mg twice daily   Neurology consulted maintain on aspirin and Plavix x3 weeks then aspirin alone for seizure prophylaxis.  Subcutaneous Lovenox for DVT prophylaxis.  Awaiting plan by cardiology for TEE to be completed while in the hospital versus outpatient to assess for thrombus.  Patient did have a fall last evening 10/14/2019 attempting to get out of bed to sit in the chair no injury sustained.  Tolerating a regular consistency diet.  Therapy evaluations completed and patient was admitted for a comprehensive rehab program.  Review of Systems  Constitutional: Negative for chills and fever.  HENT: Negative for hearing loss.   Eyes:  Negative for blurred vision and double vision.  Respiratory: Negative for cough and shortness of breath.   Cardiovascular: Positive for leg swelling. Negative for chest pain and palpitations.  Gastrointestinal: Positive for constipation. Negative for heartburn, nausea and vomiting.  Genitourinary: Negative for dysuria, flank pain and hematuria.  Musculoskeletal: Positive for joint pain and myalgias.  Skin: Negative for rash.  Neurological: Positive for seizures, weakness and headaches.  Psychiatric/Behavioral: Positive for depression. The patient has insomnia.        Anxiety as well as bipolar disorder  All other systems reviewed and are negative.  Past Medical History:  Diagnosis Date  . Anemia   . Anxiety   . Bipolar 1 disorder ([place])   . Common migraine 05/19/2015  . Depression   . Diabetes mellitus, type II ([place])   . Hypertension   . Irritable bowel syndrome (IBS)   . Mild mental retardation   . Obesity   . Partial complex seizure disorder with intractable epilepsy ([place]) 05/12/2014  . Seizures ([place])    intractable, sz 08/23/17  . Sleep apnea   . Stroke ([place])   . Type II or unspecified type diabetes mellitus without mention of complication, not stated as uncontrolled    Past Surgical History:  Procedure Laterality Date  . COLONOSCOPY     2012-normal , Dr Sharlett Iles  . ESOPHAGOGASTRODUODENOSCOPY     normal-Dr Patterson 2012  . LOOP RECORDER INSERTION N/A 05/30/2017   Procedure: Loop Recorder Insertion;  Surgeon: Constance Haw, MD;  Location: [facility];  Service: Cardiovascular;  Laterality: N/A;  . LOOP RECORDER REMOVAL N/A 03/04/2018   Procedure: LOOP RECORDER REMOVAL;  Surgeon: Curt Bears,  Ocie Doyne, MD;  Location: [facility];  Service: Cardiovascular;  Laterality: N/A;  . MYRINGOTOMY WITH TUBE PLACEMENT    . MYRINGOTOMY WITH TUBE PLACEMENT Right 11/05/2017   Procedure: MYRINGOTOMY WITH TUBE PLACEMENT;  Surgeon: Melissa Montane, MD;  Location: [facility];   Service: ENT;  Laterality: Right;  right T Tube placement  . NASAL SINUS SURGERY    . TEE WITHOUT CARDIOVERSION N/A 05/30/2017   Procedure: TRANSESOPHAGEAL ECHOCARDIOGRAM (TEE);  Surgeon: Acie Fredrickson Wonda Cheng, MD;  [facility] ENDOSCOPY;  Service: Cardiovascular;  Laterality: N/A;   Family History  Problem Relation Age of Onset  . Diabetes Mother        passed away from accidental death  . Hypertension Mother   . Bipolar disorder Father   . Diabetes Daughter   . Leukemia Daughter   . Ovarian cancer Maternal Aunt    Social History:  reports that she has never smoked. She has never used smokeless tobacco. She reports that she does not drink alcohol or use drugs. Allergies:  Allergies  Allergen Reactions  . Amoxicillin Itching    Has patient had a PCN reaction causing immediate rash, facial/tongue/throat swelling, SOB or lightheadedness with hypotension: yes Has patient had a PCN reaction causing severe rash involving mucus membranes or skin necrosis: no Has patient had a PCN reaction that required hospitalization: no Has patient had a PCN reaction occurring within the last 10 years: yes If all of the above answers are "NO", then may proceed with Cephalosporin use.  Marland Kitchen Lisinopril Swelling    Angioedema   . Hydrocodone Other (See Comments)    Depressed   . Tegretol [Carbamazepine] Swelling    Throat swells   Medications Prior to Admission  Medication Sig Dispense Refill  . ARIPiprazole (ABILIFY) 10 MG tablet Take 10 mg by mouth daily.    Derrill Memo ON 10/16/2019] aspirin EC 81 MG EC tablet Take 1 tablet (81 mg total) by mouth daily.    Marland Kitchen atorvastatin (LIPITOR) 40 MG tablet Take 1 tablet (40 mg total) by mouth daily at 6 PM.    . Blood Glucose Monitoring Suppl KIT Please supply kit covered by insurance and supplies for testing four times daily. E11.9 E10.9 1 each 0  . Blood Pressure Monitor KIT Take blood pressure reading twice a day for the next 2 weeks and then once a day. 1 each 0  .  [START ON 10/16/2019] clopidogrel (PLAVIX) 75 MG tablet Take 1 tablet (75 mg total) by mouth daily for 21 days. 21 tablet 0  . hydrALAZINE (APRESOLINE) 10 MG tablet Take 1 tablet (10 mg total) by mouth 2 (two) times daily.    . isosorbide dinitrate (ISORDIL) 10 MG tablet Take 1 tablet (10 mg total) by mouth 2 (two) times daily.    Marland Kitchen lamoTRIgine (LAMICTAL) 100 MG tablet Take 1 tablet (100 mg total) by mouth 2 (two) times daily.    Derrill Memo ON 10/16/2019] polyethylene glycol (MIRALAX / GLYCOLAX) 17 g packet Take 17 g by mouth daily. 14 each 0  . senna-docusate (SENOKOT-S) 8.6-50 MG tablet Take 1 tablet by mouth at bedtime as needed for mild constipation.    . sitaGLIPtin (JANUVIA) 50 MG tablet Take 1 tablet by mouth daily. 90 tablet 1  . torsemide (DEMADEX) 10 MG tablet Take 10 mg by mouth daily.    . Vitamin D, Ergocalciferol, (DRISDOL) 1.25 MG (50000 UT) CAPS capsule TAKE 1 CAPSULE (50,000 UNITS TOTAL) BY MOUTH EVERY 7 (SEVEN) DAYS. (Patient taking  differently: Take 50,000 Units by mouth every Tuesday. ) 5 capsule 0  . zolpidem (AMBIEN) 5 MG tablet Take 1 tablet (5 mg total) by mouth at bedtime as needed for sleep. 30 tablet 0    Drug Regimen Review Drug regimen was reviewed and remains appropriate with no significant issues identified  Home: Home Living Family/patient expects to be discharged to:: Private residence Living Arrangements: Children, Non-relatives/Friends   Functional History:    Functional Status:  Mobility:          ADL:    Cognition: Cognition Orientation Level: Oriented X4    Physical Exam: Blood pressure (!) 155/87, pulse 79, temperature 98.3 F (36.8 C), temperature source Oral, resp. rate 20, SpO2 100 %. General: Alert and oriented x 3, No apparent distress HEENT: Head is normocephalic, atraumatic, PERRLA, EOMI, sclera anicteric, oral mucosa pink and moist, dentition intact, ext ear canals clear,  Neck: Supple without JVD or lymphadenopathy Heart: Reg  rate and rhythm. No murmurs rubs or gallops Chest: CTA bilaterally without wheezes, rales, or rhonchi; no distress Abdomen: Soft, non-tender, non-distended, bowel sounds positive. Extremities: No clubbing, cyanosis, or edema. Pulses are 2+ Skin: Clean and intact without signs of breakdown Neuro: Pt is cognitively appropriate with normal insight, memory, and awareness. Patient is alert makes good eye contact with examiner.  She follows simple commands.  She answers basic questions as her name and age.  Limited medical historian.  She was cooperative with exam.  Cranial nerves 2-12 are intact. Sensory exam is normal. Reflexes are 2+ in all 4's. Fine motor coordination is intact. No tremors. Motor function is grossly 5/5, except 4/5 through RLE. Musculoskeletal: Full ROM, No pain with AROM or PROM in the neck, trunk, or extremities. Posture appropriate Psych: Pt's affect is appropriate. Pt is cooperative   Results for orders placed or performed during the hospital encounter of 10/15/19 (from the past 48 hour(s))  CBC     Status: Abnormal   Collection Time: 10/15/19  4:36 PM  Result Value Ref Range   WBC 8.8 4.0 - 10.5 K/uL   RBC 3.47 (L) 3.87 - 5.11 MIL/uL   Hemoglobin 9.7 (L) 12.0 - 15.0 g/dL   HCT 31.4 (L) 36.0 - 46.0 %   MCV 90.5 80.0 - 100.0 fL   MCH 28.0 26.0 - 34.0 pg   MCHC 30.9 30.0 - 36.0 g/dL   RDW 14.4 11.5 - 15.5 %   Platelets 317 150 - 400 K/uL   nRBC 0.0 0.0 - 0.2 %    Comment: Performed at [facility], [place] [address] [place], [place] [address]  Creatinine, serum     Status: Abnormal   Collection Time: 10/15/19  4:36 PM  Result Value Ref Range   Creatinine, Ser 1.31 (H) 0.44 - 1.00 mg/dL   GFR calc non Af Amer 47 (L) >60 mL/min   GFR calc Af Amer 55 (L) >60 mL/min    Comment: Performed at [facility] [address], [place], [place] [address]  Glucose, capillary     Status: Abnormal   Collection Time: 10/15/19  5:32 PM  Result Value Ref Range    Glucose-Capillary 105 (H) 70 - 99 mg/dL   No results found.     Medical Problem List and Plan: 1.  Right side weakness secondary to posterior left frontal lobe infarct left ACA territory embolic pattern as well as history of CVA 05/2017 2.  Antithrombotics: -DVT/anticoagulation: Lovenox  -antiplatelet therapy: Aspirin 81 mg daily,  Plavix 75 mg daily x3 weeks then aspirin alone 3. Pain Management: Tylenol as needed 4. Mood/bipolar disorder/mild mental retardation: Abilify 10 mg daily  -antipsychotic agents: N/A 5. Neuropsych: This patient is capable of making decisions on her own behalf. 6. Skin/Wound Care: Routine skin checks 7. Fluids/Electrolytes/Nutrition: Routine in and outs with follow-up chemistries 8.  Seizure disorder.  Lamictal increased to 100 mg twice daily.  Follow-up neurology services 9.  Cardiomyopathy with chronic diastolic and systolic congestive heart failure.  Monitor for any signs of fluid overload.  Cardiology follow-up await plan for TEE inpatient versus outpatient. 10.  Permissive hypertension.  Hydralazine 10 mg twice daily, Isordil 10 mg twice daily 11.  Diabetes mellitus with peripheral neuropathy.  Hemoglobin A1c 6.5.  SSI.  Patient on Januvia 50 mg daily prior to admission.  Resume as needed 12.  CKD stage III.  Creatinine 1.24-1.52.  Follow-up chemistries 13.  Morbid obesity.  BMI 49.13.  Dietary follow-up 14.  Hyperlipidemia.  Lipitor 15.OSA.CPAP 16. Insomnia: Mrs. Grossberg sleeps at midnight and wakes at 4am. She often sleeps poorly. She is on Ambien '5mg'$  PRN HS for insomnia. Recommended that she drink chamomile tea at 10pm to help her feel drowsy, with a goal of sleeping at 11pm.  17. Vitamin D Deficiency: Continue Ergocalciferol 50,000 U every Tuesday.   I have personally performed a face to face diagnostic evaluation, including, but not limited to relevant history and physical exam findings, of this patient and developed relevant assessment and plan.   Additionally, I have reviewed and concur with the physician assistant's documentation above.  The patient's status has not changed. The original post admission physician evaluation remains appropriate, and any changes from the pre-admission screening or documentation from the acute chart are noted above.   Leeroy Cha, MD

## 2019-10-15 NOTE — Progress Notes (Signed)
NURSING PROGRESS NOTE  Brandi Gomez [id number] Discharge Data: 10/15/2019 3:50 PM Attending Provider: Antonieta Pert, MD [id number], Ines Bloomer, MD     [name] to be discharged per order to Inpatient Rehab, to [phone]. All patient belongings sent with pt. Report given to [place], Therapist, sports.  Last Vital Signs:  Blood pressure 115/66, pulse 72, temperature 98.6 F (37 C), temperature source Oral, resp. rate 18, height '5\' 1"'$  (1.549 m), weight 117.9 kg, SpO2 100 %.  Discharge Medication List Allergies as of 10/15/2019      Reactions   Amoxicillin Itching   Has patient had a PCN reaction causing immediate rash, facial/tongue/throat swelling, SOB or lightheadedness with hypotension: yes Has patient had a PCN reaction causing severe rash involving mucus membranes or skin necrosis: no Has patient had a PCN reaction that required hospitalization: no Has patient had a PCN reaction occurring within the last 10 years: yes If all of the above answers are "NO", then may proceed with Cephalosporin use.   Lisinopril Swelling   Angioedema    Hydrocodone Other (See Comments)   Depressed    Tegretol [carbamazepine] Swelling   Throat swells      Medication List    STOP taking these medications   carvedilol 3.125 MG tablet Commonly known as: COREG   triamcinolone 0.1 % paste Commonly known as: KENALOG     TAKE these medications   ARIPiprazole 10 MG tablet Commonly known as: ABILIFY Take 10 mg by mouth daily. What changed: Another medication with the same name was removed. Continue taking this medication, and follow the directions you see here.   aspirin 81 MG EC tablet Take 1 tablet (81 mg total) by mouth daily. Start taking on: October 16, 2019   atorvastatin 40 MG tablet Commonly known as: LIPITOR Take 1 tablet (40 mg total) by mouth daily at 6 PM. What changed:   medication strength  how much to take   Blood Glucose Monitoring Suppl Kit Please supply kit covered by  insurance and supplies for testing four times daily. E11.9 E10.9   Blood Pressure Monitor Kit Take blood pressure reading twice a day for the next 2 weeks and then once a day.   clopidogrel 75 MG tablet Commonly known as: PLAVIX Take 1 tablet (75 mg total) by mouth daily for 21 days. Start taking on: October 16, 2019   hydrALAZINE 10 MG tablet Commonly known as: APRESOLINE Take 1 tablet (10 mg total) by mouth 2 (two) times daily.   isosorbide dinitrate 10 MG tablet Commonly known as: ISORDIL Take 1 tablet (10 mg total) by mouth 2 (two) times daily.   lamoTRIgine 100 MG tablet Commonly known as: LAMICTAL Take 1 tablet (100 mg total) by mouth 2 (two) times daily. What changed:   medication strength  how much to take  how to take this  when to take this  additional instructions   polyethylene glycol 17 g packet Commonly known as: MIRALAX / GLYCOLAX Take 17 g by mouth daily. Start taking on: October 16, 2019   senna-docusate 8.6-50 MG tablet Commonly known as: Senokot-S Take 1 tablet by mouth at bedtime as needed for mild constipation.   sitaGLIPtin 50 MG tablet Commonly known as: Januvia Take 1 tablet by mouth daily.   torsemide 10 MG tablet Commonly known as: DEMADEX Take 10 mg by mouth daily.   Vitamin D (Ergocalciferol) 1.25 MG (50000 UT) Caps capsule Commonly known as: DRISDOL TAKE 1 CAPSULE (50,000 UNITS TOTAL) BY MOUTH EVERY  7 (SEVEN) DAYS. What changed: when to take this   zolpidem 5 MG tablet Commonly known as: AMBIEN Take 1 tablet (5 mg total) by mouth at bedtime as needed for sleep.

## 2019-10-15 NOTE — Progress Notes (Signed)
Progress Note  Patient Name: Brandi Gomez Date of Encounter: 10/15/2019  Primary Cardiologist: Skeet Latch, MD   Subjective   Feeling well. No complaints.   Inpatient Medications    Scheduled Meds: . ARIPiprazole  10 mg Oral Daily  . aspirin EC  81 mg Oral Daily  . atorvastatin  40 mg Oral q1800  . clopidogrel  75 mg Oral Daily  . enoxaparin (LOVENOX) injection  40 mg Subcutaneous Q24H  . hydrALAZINE  10 mg Oral BID  . insulin aspart  0-5 Units Subcutaneous QHS  . insulin aspart  0-9 Units Subcutaneous TID WC  . isosorbide dinitrate  10 mg Oral BID  . lamoTRIgine  100 mg Oral BID  . polyethylene glycol  17 g Oral Daily  . Vitamin D (Ergocalciferol)  50,000 Units Oral Q Tue   Continuous Infusions:  PRN Meds: acetaminophen **OR** acetaminophen (TYLENOL) oral liquid 160 mg/5 mL **OR** acetaminophen, senna-docusate, zolpidem   Vital Signs    Vitals:   10/15/19 0000 10/15/19 0313 10/15/19 0801 10/15/19 0827  BP:  104/68  135/66  Pulse:  60  63  Resp: 18  13 19   Temp:  98.6 F (37 C)  98.5 F (36.9 C)  TempSrc:  Oral  Oral  SpO2:  100%  100%  Weight:      Height:        Intake/Output Summary (Last 24 hours) at 10/15/2019 1019 Last data filed at 10/15/2019 0945 Gross per 24 hour  Intake 600 ml  Output -  Net 600 ml   Last 3 Weights 10/11/2019 08/28/2019 08/09/2019  Weight (lbs) 260 lb 275 lb 9.2 oz 274 lb 6.4 oz  Weight (kg) 117.935 kg 125 kg 124.467 kg  Some encounter information is confidential and restricted. Go to Review Flowsheets activity to see all data.      Telemetry    Sinus rhythm, sinus bradycardia.  - Personally Reviewed  ECG    n/a - Personally Reviewed  Physical Exam   VS:  BP 135/66 (BP Location: Left Arm)   Pulse 63   Temp 98.5 F (36.9 C) (Oral)   Resp 19   Ht 5\' 1"  (1.549 m)   Wt 117.9 kg   SpO2 100%   BMI 49.13 kg/m  , BMI Body mass index is 49.13 kg/m. GENERAL:  Well appearing.  No acute distress.  Morbidly obese  HEENT: Pupils equal round and reactive, fundi not visualized, oral mucosa unremarkable NECK:  No jugular venous distention, waveform within normal limits, carotid upstroke brisk and symmetric, no bruits LUNGS:  Clear to auscultation bilaterally HEART:  RRR.  PMI not displaced or sustained,S1 and S2 within normal limits, no S3, no S4, no clicks, no rubs, no murmurs ABD:  Flat, positive bowel sounds normal in frequency in pitch, no bruits, no rebound, no guarding, no midline pulsatile mass, no hepatomegaly, no splenomegaly EXT:  2 plus pulses throughout, no edema, no cyanosis no clubbing SKIN:  No rashes no nodules NEURO:  Cranial nerves II through XII grossly intact, motor grossly intact throughout PSYCH:  Cognitively intact, oriented to person place and time.  Flat affect  Labs    High Sensitivity Troponin:  No results for input(s): TROPONINIHS in the last 720 hours.    Chemistry Recent Labs  Lab 10/11/19 2345 10/13/19 0029  NA 138 137  K 4.2 4.0  CL 104 103  CO2 26 25  GLUCOSE 122* 132*  BUN 21* 20  CREATININE 1.52* 1.29*  CALCIUM  8.5* 8.3*  PROT 6.9  --   ALBUMIN 3.3*  --   AST 17  --   ALT 12  --   ALKPHOS 75  --   BILITOT 0.8  --   GFRNONAA 40* 48*  GFRAA 46* 56*  ANIONGAP 8 9     Hematology Recent Labs  Lab 10/11/19 2345  WBC 8.2  RBC 3.61*  HGB 10.4*  HCT 33.2*  MCV 92.0  MCH 28.8  MCHC 31.3  RDW 14.6  PLT 350    BNPNo results for input(s): BNP, PROBNP in the last 168 hours.   DDimer No results for input(s): DDIMER in the last 168 hours.   Radiology    No results found.  Cardiac Studies   Echo 10/12/19: IMPRESSIONS   1. Left ventricular ejection fraction, by visual estimation, is 35 to 40%. The left ventricle has moderately decreased function. There is mildly increased left ventricular hypertrophy.  2. Left ventricular diastolic parameters are consistent with Grade II diastolic dysfunction (pseudonormalization).  3. Global right ventricle has  normal systolic function.The right ventricular size is normal.  4. Left atrial size was normal.  5. Right atrial size was normal.  6. The mitral valve is normal in structure. No evidence of mitral valve regurgitation. No evidence of mitral stenosis.  7. The tricuspid valve is normal in structure. Tricuspid valve regurgitation is trivial.  8. The aortic valve is normal in structure. Aortic valve regurgitation is not visualized. No evidence of aortic valve sclerosis or stenosis.  9. The pulmonic valve was not well visualized. Pulmonic valve regurgitation is not visualized. 10. Technically difficult; moderate global reduction in LV systolic function; grade 2 diastolic dysfunction; mild LVH.  Carotid Doppler: Summary: Right Carotid: Velocities in the right ICA are consistent with a 1-39% stenosis.  Left Carotid: Velocities in the left ICA are consistent with a 1-39% stenosis.  Patient Profile     Ms. Williamsen is a 54F with prior stroke, chronic systolic and diastolic heart failure, hypertension, hyperlipidemia, morbid obesity, CKD3, OSA on CPAP, and mental disability admitted with recurrent stroke.  Cardiology was consulted due to worsening systolic function.  Assessment & Plan    # Chronic systolic and diastolic heart failure:  LVEF is reduced to 35-40% this admission from 40-45% previously.  Overall this is not a very significant change, though I do concur there is a slight change on the echocardiogram.  Clinically she does not have any heart failure symptoms.  Her beta-blocker has been held due to bradycardia.  She had angioedema with lisinopril so no ARB or ARNI.  She was started on low dose of hydralazine/nitrates.  BP is labile so cannot titrate at this time.  After her 2 episodes of seizure she did have bradycardia to the 40s and 50s.  Therefore, would not rechallenge with a beta-blocker at this time.  She has not been on an ACE inhibitor/ARB or ARNI.  Blood pressure remains too low for this  at this time.  If her renal function continues to improve and her blood pressure increases we will add a low-dose of losartan.  She ultimately will need an ischemic work-up.  Given her body habitus cardiac catheterization is most likely to be definitive about her coronary anatomy.  However I do not think this needs to be done acutely.  In light of her recent stroke and recurrent seizures will likely plan to do this as an outpatient.  # Recurrent stroke:  No afib/pfo noted at the time of  her prior stroke.  Loop recorder removed 2/2 patient discomfort. No plan to replace.  Continue aspirin clopidogrel.  TEE scheduled for Monday at 1pm.  This can be done as an outpatient.   # Carotid stenosis: Mild by Doppler this admission.  Continue aspirin, clopidogrel, statin.       For questions or updates, please contact [facility] Please consult [email or web address] for contact info under        Signed, Skeet Latch, MD  10/15/2019, 10:19 AM

## 2019-10-15 NOTE — Discharge Summary (Signed)
Physician Discharge Summary  Brandi Gomez ZYS:[id number] DOB: 11-Nov-1971 DOA: 10/11/2019  PCP: Horald Pollen, MD  Admit date: 10/11/2019 Discharge date: 10/15/2019  Admitted From: home Disposition:  CIR  Recommendations for Outpatient Follow-up:  1. Follow up with PCP in 1-2 weeks 2. Please obtain BMP/CBC in one week 3. Please follow up on the following pending results:  Home Health:no  Equipment/Devices: no  Discharge Condition: Stable CODE STATUS: FULL Diet recommendation: Heart Healthy, diabetic   Brief/Interim Summary: 48 year old female with CKD stage III type 2 diabetes bipolar disorder depression/hypertension, hyperlipidemia admitted 11/3 with right leg weakness MRI showed 12 mm acute ischemic nonhemorrhagic posterior left frontal lobe infarct, left ACA distribution neurology was consulted and patient was admitted. Overnight patient had 2 breakthrough seizures seen by neurology 11/5. Overall patient is clinically improving, 11/6-EEG and Lamictal dose increased. At this time patient is overall stable she will need continued inpatient rehab and is being discharged to CIR, her hospital course is listed as below  Where she was seen by neurology, cardiology underwent extensive work-up for acute stroke, seizure  Subjective: Seen and examined  On bedside chair Did not sleep well last night, took laxative yesterday but has not had bowel movement yet.  Rt leg felling stronger Lives with friend and daughter at home   Assessment & Plan:   Acute CVA 12 mm acute ischemic nonhemorrhagic posterior left frontal lobe infarct, left ACA distribution, history of stroke in 2018: Presents with right leg weakness.  LDL 92 , HbA1c 6.5, echocardiogram with low EF 35 to 40%, carotid Doppler B IC 1-39% stenosis, VAs antegrade. Appreciate neurology input, continue aspirin 81, Plavix 75 x 3 weeks then continue aspirin alone.  Continue Lipitor.Continue PT has advised CIR.  Monitor neuro  check.  Telemetry.  Cardiology recommends TEE and can be done as outpatient.  History of prior stroke/TIA June/2018 at that time TEE unremarkable, no PFO or loop recorder placed 6/22 no A. fib found and removed 03/04/18  Breakthrough seizures  x2 11/5 night with postictal confusion, with history of known partial complex epilepsy status post Keppra x1, Lamictal level pending.  Lamictal dose increased to 100 twice daily, patient not tolerating Keppra Tegretol or Vimpat in the past.  Patient is to follow-up with Dr. Arlana Hove as outpatient. Continue seizure prophylaxis.  Cardiomyopathy with newly reduced EF 35 to 93%/ATFTDDU systolic congestive heart failure and diastolic CHF with grade 2 DD: Seen by cardiology appreciate input.Currently euvolemic.  Blocker has been held due to bradycardia.  Continue on hydralazine and Isordil as per cardiology.  May need outpatient ischemic evaluation.  Cardiology planning for outpatient TEE.  DM2 : hba1c 6.5 11/13, cont ssi.  Resume home meds.  Blood sugar is stable.  OSA hx- on cpap here  Bipolar 1 disorder: cont home meds   HLD-cont lipitor  CKD stage 3, GFR 30-59 ml/min-creat stable  Insomnia:start Ambien at bedtime  Constipation continue laxatives  Body mass index is 49.13 kg/m.    DVT prophylaxis: SCD/lovenox Code Status: full Family Communication: plan of care discussed with patient at bedside. Disposition Plan: Planning for CIR once approved by insurance.   Discussed with cardiology advised that this TEE can be done as outpatient.  Consultants: neuro  Procedures:  EEG 11/6: showed evidence of epileptogenicity arising from left and right frontal region. Its possible that patient has a single focus with either left or right spread vs independent bilateral foci. Additionally, there is evidence of mild diffuse encephalopathy, nonspecific to etiology. No  seizures were seen throughout the recording.  MRI brain 1. 12 mm acute ischemic  nonhemorrhagic posterior left frontal lobe infarct, left ACA distribution. No associated hemorrhage or mass effect. 2. Underlying mild chronic microvascular ischemic disease.  MRA angio head No intracranial large vessel occlusion or proximal high-grade arterial stenosis.  Mild focal stenosis within the non dominant A1 right anterior cerebral artery.  MR lumbar spine 1. No acute abnormality within the lumbar spine. No significant spinal stenosis or evidence for neural impingement. 2. Moderate facet arthropathy at L4-5 and L5-S1 with resultant mild right L4 and left L5 foraminal stenosis. 3. Diffusely decreased T1 marrow signal intensity, nonspecific, but most commonly related to anemia, smoking, or obesity.  Microbiology: none    Discharge Diagnoses:  Principal Problem:   Acute CVA (cerebrovascular accident) ([place]) Active Problems:   OSA (obstructive sleep apnea)   Bipolar 1 disorder (HCC)   HLD (hyperlipidemia)   CKD (chronic kidney disease) stage 3, GFR 30-59 ml/min   Acute ischemic stroke (HCC)   Chronic combined systolic and diastolic heart failure [facility])    Discharge Instructions  Discharge Instructions    Diet - low sodium heart healthy   Complete by: As directed    Discharge instructions   Complete by: As directed    Pt will follow up with Dr. Henderson Baltimore, NP at [facility] on 11/09/19.  Please call call MD or return to ER for similar or worsening recurring problem that brought you to hospital or if any fever,nausea/vomiting,abdominal pain, uncontrolled pain, chest pain,  shortness of breath or any other alarming symptoms.  Please follow-up your doctor as instructed in a week time and call the office for appointment.  Please avoid alcohol, smoking, or any other illicit substance and maintain healthy habits including taking your regular medications as prescribed.  You were cared for by a hospitalist during your hospital stay. If you have any questions about your  discharge medications or the care you received while you were in the hospital after you are discharged, you can call the unit and ask to speak with the hospitalist on call if the hospitalist that took care of you is not available.  Once you are discharged, your primary care physician will handle any further medical issues. Please note that NO REFILLS for any discharge medications will be authorized once you are discharged, as it is imperative that you return to your primary care physician (or establish a relationship with a primary care physician if you do not have one) for your aftercare needs so that they can reassess your need for medications and monitor your lab values   Increase activity slowly   Complete by: As directed      Allergies as of 10/15/2019      Reactions   Amoxicillin Itching   Has patient had a PCN reaction causing immediate rash, facial/tongue/throat swelling, SOB or lightheadedness with hypotension: yes Has patient had a PCN reaction causing severe rash involving mucus membranes or skin necrosis: no Has patient had a PCN reaction that required hospitalization: no Has patient had a PCN reaction occurring within the last 10 years: yes If all of the above answers are "NO", then may proceed with Cephalosporin use.   Lisinopril Swelling   Angioedema    Hydrocodone Other (See Comments)   Depressed    Tegretol [carbamazepine] Swelling   Throat swells      Medication List    STOP taking these medications   carvedilol 3.125 MG tablet Commonly known as: COREG  triamcinolone 0.1 % paste Commonly known as: KENALOG     TAKE these medications   ARIPiprazole 10 MG tablet Commonly known as: ABILIFY Take 10 mg by mouth daily. What changed: Another medication with the same name was removed. Continue taking this medication, and follow the directions you see here.   aspirin 81 MG EC tablet Take 1 tablet (81 mg total) by mouth daily. Start taking on: October 16, 2019    atorvastatin 40 MG tablet Commonly known as: LIPITOR Take 1 tablet (40 mg total) by mouth daily at 6 PM. What changed:   medication strength  how much to take   Blood Glucose Monitoring Suppl Kit Please supply kit covered by insurance and supplies for testing four times daily. E11.9 E10.9   Blood Pressure Monitor Kit Take blood pressure reading twice a day for the next 2 weeks and then once a day.   clopidogrel 75 MG tablet Commonly known as: PLAVIX Take 1 tablet (75 mg total) by mouth daily for 21 days. Start taking on: October 16, 2019   hydrALAZINE 10 MG tablet Commonly known as: APRESOLINE Take 1 tablet (10 mg total) by mouth 2 (two) times daily.   isosorbide dinitrate 10 MG tablet Commonly known as: ISORDIL Take 1 tablet (10 mg total) by mouth 2 (two) times daily.   lamoTRIgine 100 MG tablet Commonly known as: LAMICTAL Take 1 tablet (100 mg total) by mouth 2 (two) times daily. What changed:   medication strength  how much to take  how to take this  when to take this  additional instructions   polyethylene glycol 17 g packet Commonly known as: MIRALAX / GLYCOLAX Take 17 g by mouth daily. Start taking on: October 16, 2019   senna-docusate 8.6-50 MG tablet Commonly known as: Senokot-S Take 1 tablet by mouth at bedtime as needed for mild constipation.   sitaGLIPtin 50 MG tablet Commonly known as: Januvia Take 1 tablet by mouth daily.   torsemide 10 MG tablet Commonly known as: DEMADEX Take 10 mg by mouth daily.   Vitamin D (Ergocalciferol) 1.25 MG (50000 UT) Caps capsule Commonly known as: DRISDOL TAKE 1 CAPSULE (50,000 UNITS TOTAL) BY MOUTH EVERY 7 (SEVEN) DAYS. What changed: when to take this   zolpidem 5 MG tablet Commonly known as: AMBIEN Take 1 tablet (5 mg total) by mouth at bedtime as needed for sleep.      Follow-up Information    [name], NP. Go on 11/09/2019.   Specialty: Neurology Contact information: [address]  [place] [address] [phone]        Deberah Pelton, NP Follow up on 11/01/2019.   Specialty: Cardiology Why: 9:30 am Contact information: [address] [place] [address] [phone]          Allergies  Allergen Reactions  . Amoxicillin Itching    Has patient had a PCN reaction causing immediate rash, facial/tongue/throat swelling, SOB or lightheadedness with hypotension: yes Has patient had a PCN reaction causing severe rash involving mucus membranes or skin necrosis: no Has patient had a PCN reaction that required hospitalization: no Has patient had a PCN reaction occurring within the last 10 years: yes If all of the above answers are "NO", then may proceed with Cephalosporin use.  Marland Kitchen Lisinopril Swelling    Angioedema   . Hydrocodone Other (See Comments)    Depressed   . Tegretol [Carbamazepine] Swelling    Throat swells    Consultations:  Neuro  cardio  Procedures/Studies: Mr Angio Head Wo Contrast  Result Date: 10/12/2019 CLINICAL DATA:  Stroke, follow-up. EXAM: MRA HEAD WITHOUT CONTRAST TECHNIQUE: Angiographic images of the Circle of Willis were obtained using MRA technique without intravenous contrast. COMPARISON:  Brain MRI 10/12/2019, MRA head 05/21/2017 FINDINGS: The intracranial internal carotid arteries are patent without significant stenosis. The right middle cerebral artery is patent without significant proximal stenosis. The right anterior cerebral artery is patent. Apparent mild focal stenosis within the non dominant right A1 segment (series 252, image 3). The left middle and anterior cerebral arteries are patent without significant proximal stenosis. No intracranial aneurysm is identified. The intracranial vertebral arteries are patent without significant stenosis, as is the basilar artery. The bilateral posterior cerebral arteries are patent without significant proximal stenosis. IMPRESSION: No intracranial large vessel occlusion  or proximal high-grade arterial stenosis. Mild focal stenosis within the non dominant A1 right anterior cerebral artery. Electronically Signed   By: Kellie Simmering DO   On: 10/12/2019 12:31   [name]  Result Date: 10/12/2019 CLINICAL DATA:  Initial evaluation for acute right lower extremity weakness, fall. EXAM: MRI HEAD WITHOUT CONTRAST TECHNIQUE: Multiplanar, multiecho pulse sequences of the brain and surrounding structures were obtained without intravenous contrast. COMPARISON:  Prior head CT from 06/30/2019. FINDINGS: Brain: Examination mildly degraded by motion artifact. Cerebral volume within normal limits for age. Patchy T2/FLAIR hyperintensity within the periventricular and deep white matter both cerebral hemispheres noted, nonspecific, but most like related chronic microvascular ischemic disease, mild in nature. Patchy focus of restricted diffusion measuring approximately 12 mm seen involving the cortical and subcortical aspect of the parasagittal posterior left frontal lobe, left ACA distribution (series 9, images 81, 83). No associated hemorrhage or mass effect. No other evidence for acute or subacute ischemia. Gray-white matter differentiation otherwise maintained. No other areas of remote cortical infarction. No foci of susceptibility artifact to suggest acute or chronic intracranial hemorrhage. No mass lesion, midline shift or mass effect. No hydrocephalus. No extra-axial fluid collection. Pituitary gland suprasellar region normal. Midline structures intact. Vascular: Major intracranial vascular flow voids are maintained. Skull and upper cervical spine: Craniocervical junction normal. Upper cervical spine within normal limits. Diffusely decreased T1 signal intensity seen throughout the visualized bone marrow, nonspecific, but most commonly related to anemia, smoking, or obesity. No discrete osseous lesions. Scalp soft tissues within normal limits. Sinuses/Orbits: Globes and orbital soft  tissues within normal limits. Paranasal sinuses are clear. Small bilateral mastoid effusions noted, of doubtful significance. Visualized nasopharynx within normal limits. Inner ear structures grossly normal. Other: None. IMPRESSION: 1. 12 mm acute ischemic nonhemorrhagic posterior left frontal lobe infarct, left ACA distribution. No associated hemorrhage or mass effect. 2. Underlying mild chronic microvascular ischemic disease. Electronically Signed   By: Jeannine Boga M.D.   On: 10/12/2019 01:13   [name]  Result Date: 10/12/2019 CLINICAL DATA:  Initial evaluation for acute right lower extremity weakness. EXAM: MRI LUMBAR SPINE WITHOUT CONTRAST TECHNIQUE: Multiplanar, multisequence MR imaging of the lumbar spine was performed. No intravenous contrast was administered. COMPARISON:  None. FINDINGS: Segmentation: Standard. Lowest well-formed disc space labeled the L5-S1 level. Alignment: Physiologic with preservation of the normal lumbar lordosis. No listhesis. Vertebrae: Vertebral body height maintained without evidence for acute or chronic fracture. Bone marrow signal intensity diffusely decreased on T1 weighted imaging, nonspecific, but most commonly related to anemia, smoking, or obesity. Few scattered benign hemangiomata noted. No other discrete or worrisome osseous lesions. No abnormal marrow edema. Conus  medullaris and cauda equina: Conus extends to the L1-2 level. Conus and cauda equina appear normal. Paraspinal and other soft tissues: Paraspinous soft tissues within normal limits. Visualized visceral structures are unremarkable. Disc levels: L1-2:  Unremarkable. L2-3:  Unremarkable. L3-4: Normal interspace. Mild bilateral facet hypertrophy. No significant stenosis. L4-5: Minimal annular disc bulge with disc desiccation. Moderate bilateral facet hypertrophy. Associated trace joint effusion on the left. No significant spinal stenosis. Mild right L4 foraminal narrowing. L5-S1:  Minimal annular disc bulge with disc desiccation. Moderate left greater than right facet hypertrophy. Associated small bilateral joint effusions. Epidural lipomatosis. No significant spinal stenosis. Mild left L5 foraminal narrowing. IMPRESSION: 1. No acute abnormality within the lumbar spine. No significant spinal stenosis or evidence for neural impingement. 2. Moderate facet arthropathy at L4-5 and L5-S1 with resultant mild right L4 and left L5 foraminal stenosis. 3. Diffusely decreased T1 marrow signal intensity, nonspecific, but most commonly related to anemia, smoking, or obesity. Electronically Signed   By: Jeannine Boga M.D.   On: 10/12/2019 01:19   Vas US Carotid (at [facility] Only)  Result Date: 10/12/2019 Carotid Arterial Duplex Study Indications:       CVA. Risk Factors:      Hypertension, hyperlipidemia, Diabetes, no history of                    smoking. Comparison Study:  Previous 05/21/2017 bilateral 1-39% stenosis. Performing Technologist: Estill Batten Mackin : RVT, RDCS (AE), RDMS  Examination Guidelines: A complete evaluation includes B-mode imaging, spectral Doppler, color Doppler, and power Doppler as needed of all accessible portions of each vessel. Bilateral testing is considered an integral part of a complete examination. Limited examinations for reoccurring indications may be performed as noted.  Right Carotid Findings: +----------+--------+--------+--------+------------------+--------+           PSV cm/sEDV cm/sStenosisPlaque DescriptionComments +----------+--------+--------+--------+------------------+--------+ CCA Prox  103     16                                         +----------+--------+--------+--------+------------------+--------+ CCA Distal71      16                                         +----------+--------+--------+--------+------------------+--------+ ICA Prox  63      19      1-39%                               +----------+--------+--------+--------+------------------+--------+ ICA Distal54      12                                         +----------+--------+--------+--------+------------------+--------+ ECA       97      15                                         +----------+--------+--------+--------+------------------+--------+ +----------+--------+-------+----------------+-------------------+           PSV cm/sEDV cmsDescribe        Arm Pressure (mmHG) +----------+--------+-------+----------------+-------------------+ [id number]  Multiphasic, WNL                    +----------+--------+-------+----------------+-------------------+ +---------+--------+--+--------+--+---------+ VertebralPSV cm/s55EDV cm/s19Antegrade +---------+--------+--+--------+--+---------+  Left Carotid Findings: +----------+--------+--------+--------+------------------+--------+           PSV cm/sEDV cm/sStenosisPlaque DescriptionComments +----------+--------+--------+--------+------------------+--------+ CCA Prox  114     24                                         +----------+--------+--------+--------+------------------+--------+ CCA Distal76      17                                         +----------+--------+--------+--------+------------------+--------+ ICA Prox  60      12      1-39%                              +----------+--------+--------+--------+------------------+--------+ ICA Distal78      21                                         +----------+--------+--------+--------+------------------+--------+ ECA       84      15                                         +----------+--------+--------+--------+------------------+--------+ +----------+--------+--------+------------+-------------------+           PSV cm/sEDV cm/sDescribe    Arm Pressure (mmHG) +----------+--------+--------+------------+-------------------+ Subclavian                Not  assessed                    +----------+--------+--------+------------+-------------------+ +---------+--------+--+--------+--+---------+ VertebralPSV cm/s44EDV cm/s15Antegrade +---------+--------+--+--------+--+---------+  Summary: Right Carotid: Velocities in the right ICA are consistent with a 1-39% stenosis. Left Carotid: Velocities in the left ICA are consistent with a 1-39% stenosis. Vertebrals:  Bilateral vertebral arteries demonstrate antegrade flow. Subclavians: Normal flow hemodynamics were seen in the right subclavian artery.              Left subclavian artery not assessed. *See table(s) above for measurements and observations.  Electronically signed by Harold Barban MD on 10/12/2019 at 5:39:15 PM.    Final   Discharge Exam: Vitals:   10/15/19 0827 10/15/19 1205  BP: 135/66 115/66  Pulse: 63 72  Resp: 19 18  Temp: 98.5 F (36.9 C) 98.6 F (37 C)  SpO2: 100% 100%   Vitals:   10/15/19 0313 10/15/19 0801 10/15/19 0827 10/15/19 1205  BP: 104/68  135/66 115/66  Pulse: 60  63 72  Resp:  _0 Temp: 98.6 F (37 C)  98.5 F (36.9 C) 98.6 F (37 C)  TempSrc: Oral  Oral Oral  SpO2: 100%  100% 100%  Weight:      Height:        General: Pt is alert, awake, not in acute distress Cardiovascular: RRR, S1/S2 +, no rubs, no gallops Respiratory: CTA bilaterally, no wheezing, no rhonchi Abdominal: Soft, NT, ND, bowel sounds + Extremities: no edema, no cyanosis   The results of  significant diagnostics from this hospitalization (including imaging, microbiology, ancillary and laboratory) are listed below for reference.     Microbiology: Recent Results (from the past 240 hour(s))  SARS CORONAVIRUS 2 (TAT 6-24 HRS) Nasopharyngeal Nasopharyngeal Swab     Status: None   Collection Time: 10/12/19  2:10 AM   Specimen: Nasopharyngeal Swab  Result Value Ref Range Status   SARS Coronavirus 2 NEGATIVE NEGATIVE Final    Comment: (NOTE) SARS-CoV-2 target nucleic acids are NOT  DETECTED. The SARS-CoV-2 RNA is generally detectable in upper and lower respiratory specimens during the acute phase of infection. Negative results do not preclude SARS-CoV-2 infection, do not rule out co-infections with other pathogens, and should not be used as the sole basis for treatment or other patient management decisions. Negative results must be combined with clinical observations, patient history, and epidemiological information. The expected result is Negative. Fact Sheet for Patients: SugarRoll.be Fact Sheet for Healthcare Providers: https://www.woods-mathews.com/ This test is not yet approved or cleared by the [place] FDA and  has been authorized for detection and/or diagnosis of SARS-CoV-2 by FDA under an Emergency Use Authorization (EUA). This EUA will remain  in effect (meaning this test can be used) for the duration of the COVID-19 declaration under Section 56 4(b)(1) of the Act, 21 U.S.C. section 360bbb-3(b)(1), unless the authorization is terminated or revoked sooner. Performed at [facility], [place] [address], [place], [place] [address]      Labs: BNP (last 3 results) Recent Labs    05/14/19 1330  BNP 60.1   Basic Metabolic Panel: Recent Labs  Lab 10/11/19 2345 10/13/19 0029  NA 138 137  K 4.2 4.0  CL 104 103  CO2 26 25  GLUCOSE 122* 132*  BUN 21* 20  CREATININE 1.52* 1.29*  CALCIUM 8.5* 8.3*  MG  --  1.8  PHOS  --  4.1   Liver Function Tests: Recent Labs  Lab 10/11/19 2345  AST 17  ALT 12  ALKPHOS 75  BILITOT 0.8  PROT 6.9  ALBUMIN 3.3*   No results for input(s): LIPASE, AMYLASE in the last 168 hours. No results for input(s): AMMONIA in the last 168 hours. CBC: Recent Labs  Lab 10/11/19 2345  WBC 8.2  NEUTROABS 4.8  HGB 10.4*  HCT 33.2*  MCV 92.0  PLT 350   Cardiac Enzymes: No results for input(s): CKTOTAL, CKMB, CKMBINDEX, TROPONINI in the last 168 hours. BNP: Invalid  input(s): POCBNP CBG: Recent Labs  Lab 10/14/19 0713 10/14/19 1156 10/14/19 1546 10/14/19 2129 10/15/19 0642  GLUCAP 113* 151* 106* 140* 110*   D-Dimer No results for input(s): DDIMER in the last 72 hours. Hgb A1c No results for input(s): HGBA1C in the last 72 hours. Lipid Profile No results for input(s): CHOL, HDL, LDLCALC, TRIG, CHOLHDL, LDLDIRECT in the last 72 hours. Thyroid function studies No results for input(s): TSH, T4TOTAL, T3FREE, THYROIDAB in the last 72 hours.  Invalid input(s): FREET3 Anemia work up No results for input(s): VITAMINB12, FOLATE, FERRITIN, TIBC, IRON, RETICCTPCT in the last 72 hours. Urinalysis    Component Value Date/Time   COLORURINE YELLOW 08/28/2019 1906   APPEARANCEUR CLEAR 08/28/2019 1906   LABSPEC 1.014 08/28/2019 1906   PHURINE 5.0 08/28/2019 1906   GLUCOSEU NEGATIVE 08/28/2019 1906   HGBUR NEGATIVE 08/28/2019 [place] NEGATIVE 08/28/2019 1906   BILIRUBINUR negative 02/10/2018 1020   BILIRUBINUR negative 07/01/2015 [place] 08/28/2019 [place] NEGATIVE 08/28/2019 1906   UROBILINOGEN  0.2 05/03/2019 1322   NITRITE NEGATIVE 08/28/2019 1906   LEUKOCYTESUR NEGATIVE 08/28/2019 1906   Sepsis Labs Invalid input(s): PROCALCITONIN,  WBC,  LACTICIDVEN Microbiology Recent Results (from the past 240 hour(s))  SARS CORONAVIRUS 2 (TAT 6-24 HRS) Nasopharyngeal Nasopharyngeal Swab     Status: None   Collection Time: 10/12/19  2:10 AM   Specimen: Nasopharyngeal Swab  Result Value Ref Range Status   SARS Coronavirus 2 NEGATIVE NEGATIVE Final    Comment: (NOTE) SARS-CoV-2 target nucleic acids are NOT DETECTED. The SARS-CoV-2 RNA is generally detectable in upper and lower respiratory specimens during the acute phase of infection. Negative results do not preclude SARS-CoV-2 infection, do not rule out co-infections with other pathogens, and should not be used as the sole basis for treatment or other patient  management decisions. Negative results must be combined with clinical observations, patient history, and epidemiological information. The expected result is Negative. Fact Sheet for Patients: SugarRoll.be Fact Sheet for Healthcare Providers: https://www.woods-mathews.com/ This test is not yet approved or cleared by the [place] FDA and  has been authorized for detection and/or diagnosis of SARS-CoV-2 by FDA under an Emergency Use Authorization (EUA). This EUA will remain  in effect (meaning this test can be used) for the duration of the COVID-19 declaration under Section 56 4(b)(1) of the Act, 21 U.S.C. section 360bbb-3(b)(1), unless the authorization is terminated or revoked sooner. Performed at [facility], [place] [address], [place], [place] [address]      Time coordinating discharge: 35 minutes  SIGNED:   Antonieta Pert, MD  [facility] 10/15/2019, 2:36 PM  If 7PM-7AM, please contact night-coverage [email or web address]

## 2019-10-15 NOTE — Progress Notes (Signed)
Physical Therapy Treatment Patient Details Name: Brandi Gomez MRN: [id number] DOB: 02/04/1971 Today's Date: 10/15/2019    History of Present Illness Brandi Gomez is a 48 y.o. female with medical history significant of anemia, anxiety, bipolar disorder, depression, type 2 diabetes, CKD stage III, CVA, hypertension, hyperlipidemia, IBS, mild mental retardation, obesity (BMI 49.13), seizures, sleep apnea presenting with complaints of right leg weakness.  MRI revealed L posterior frontal lobe CVA.    PT Comments    Pt progressing well with activity this session, tolerating hallway ambulation with use of RW and circuit-type exercises including sit to stands, LAQ with light manual resistance, and standing marches with UE support. Pt with no evidence of R knee instability this session. PT expects pt to continue to improve mobility-wise, PT continuing to recommend CIR to maximize pt independence.   Follow Up Recommendations  CIR     Equipment Recommendations  Other (comment)(to be determined in next venue of care)    Recommendations for Other Services       Precautions / Restrictions Precautions Precautions: Fall Restrictions Weight Bearing Restrictions: No    Mobility  Bed Mobility Overal bed mobility: Needs Assistance             General bed mobility comments: pt up in chair upon PT arrival, requests back to chair post-PT.  Transfers Overall transfer level: Needs assistance Equipment used: Rolling walker (2 wheeled) Transfers: Sit to/from Stand Sit to Stand: +2 safety/equipment;Min guard         General transfer comment: Min guard for safety, verbal cuing for hand placement when rising for each sit<>stand.  Ambulation/Gait Ambulation/Gait assistance: Min guard Gait Distance (Feet): 70 Feet(10+60) Assistive device: Rolling walker (2 wheeled) Gait Pattern/deviations: Step-through pattern;Trunk flexed Gait velocity: decr   General Gait Details: Min guard  for safety, verbal cuing for upright posture, placement in RW, safe use of RW and avoiding hallway/room obstacles.   Stairs             Wheelchair Mobility    Modified Rankin (Stroke Patients Only) Modified Rankin (Stroke Patients Only) Pre-Morbid Rankin Score: Moderate disability Modified Rankin: Moderately severe disability     Balance Overall balance assessment: Needs assistance Sitting-balance support: No upper extremity supported;Feet supported Sitting balance-Leahy Scale: Fair Sitting balance - Comments: no physical A needed at EOB.   Standing balance support: Bilateral upper extremity supported Standing balance-Leahy Scale: Poor Standing balance comment: requires UE support for safety and balance                            Cognition Arousal/Alertness: Awake/alert Behavior During Therapy: WFL for tasks assessed/performed;Impulsive Overall Cognitive Status: No family/caregiver present to determine baseline cognitive functioning Area of Impairment: Attention;Memory;Following commands;Safety/judgement                   Current Attention Level: Sustained Memory: Decreased short-term memory Following Commands: Follows one step commands with increased time;Follows multi-step commands inconsistently Safety/Judgement: Decreased awareness of safety Awareness: Emergent Problem Solving: Slow processing;Requires verbal cues;Requires tactile cues General Comments: A&Ox4, increased time to respond to questions but responds appropriately. Pt very pleasant and conversive today, states she had a fall last night when prompted by PT, pt reports no injury or pain due to fall.      Exercises General Exercises - Lower Extremity Mini-Sqauts: AROM;Both;20 reps;Seated;Standing(2x10 repetition sit to stands from recliner, VC for form and slow eccentric lowering into chair)    General Comments  General comments (skin integrity, edema, etc.): RR up to 31 breaths/min  with activity, HR 87-118 bpm during activity.      Pertinent Vitals/Pain Pain Assessment: No/denies pain    Home Living                      Prior Function            PT Goals (current goals can now be found in the care plan section) Acute Rehab PT Goals Patient Stated Goal: get better so I can go home PT Goal Formulation: With patient Time For Goal Achievement: 10/26/19 Potential to Achieve Goals: Good Progress towards PT goals: Progressing toward goals    Frequency    Min 4X/week      PT Plan Current plan remains appropriate    Co-evaluation              AM-PAC PT "6 Clicks" Mobility   Outcome Measure  Help needed turning from your back to your side while in a flat bed without using bedrails?: A Little Help needed moving from lying on your back to sitting on the side of a flat bed without using bedrails?: A Little Help needed moving to and from a bed to a chair (including a wheelchair)?: A Little Help needed standing up from a chair using your arms (Gomez.g., wheelchair or bedside chair)?: A Little Help needed to walk in hospital room?: A Little Help needed climbing 3-5 steps with a railing? : A Lot 6 Click Score: 17    End of Session Equipment Utilized During Treatment: Gait belt Activity Tolerance: Patient tolerated treatment well Patient left: in chair;with call bell/phone within reach;with chair alarm set Nurse Communication: Mobility status PT Visit Diagnosis: Unsteadiness on feet (R26.81);History of falling (Z91.81);Ataxic gait (R26.0);Difficulty in walking, not elsewhere classified (R26.2)     Time: [id number] PT Time Calculation (min) (ACUTE ONLY): 21 min  Charges:  $Therapeutic Exercise: 8-22 mins                     Brandi Gomez, PT Acute Rehabilitation Services Pager [phone]  Office [phone]    Declan Adamson D Davy Westmoreland 10/15/2019, 10:58 AM

## 2019-10-15 NOTE — Patient Outreach (Signed)
[facility]) Care Management  10/15/2019  VALINCIA SCHALL 03-24-1971 [id number]   RN CM continues to monitor patient admission.  Patient noted to be inpatient rehab.  RN CM will continue to follow inpatient status.    [name], RN, MSN [facility] Care Management Coordinator Direct Line [phone] Cell [phone] Toll Free: [phone]  Fax: [phone]

## 2019-10-15 NOTE — H&P (Signed)
Physical Medicine and Rehabilitation Admission H&P    Chief Complaint  Patient presents with  . Weakness  : HPI: Brandi Gomez is a 48 year old right-handed female with history of anxiety, bipolar disorder, diabetes mellitus, sleep apnea, chronic systolic and diastolic heart failure, CKD stage III, morbid obesity BMI 49.13, hypertension, mild mental retardation, seizure disorder followed by Dr. Jannifer Franklin maintained on Lamictal, prior cryptogenic stroke with loop recorder placement 05/30/2017 showing no A. fib and removed 03/04/2018 at patient request as well as TEE at that time unremarkable no PFO.  Per chart review patient lives with her daughter.  1 level home with 4-5 steps to entry.  Family assistance provided as needed.  Patient was independent with ADLs but did not cook due to some memory deficits.  Presented 10/12/2019 with right leg weakness.  MRI of the brain showed a 12 mm acute ischemic nonhemorrhagic posterior left frontal lobe infarction, left ACA distribution.  No associated hemorrhage or mass-effect.  MRA with no intracranial large vessel occlusion or proximal high-grade stenosis.  Echocardiogram with ejection fraction of 40%.  Patient with noted breakthrough seizures x2.  EEG showed no seizure activity but frequent bifrontal sharps and patient's Lamictal was increased to 100 mg twice daily   Neurology consulted maintain on aspirin and Plavix x3 weeks then aspirin alone for seizure prophylaxis.  Subcutaneous Lovenox for DVT prophylaxis.  Awaiting plan by cardiology for TEE to be completed while in the hospital versus outpatient to assess for thrombus.  Patient did have a fall last evening 10/14/2019 attempting to get out of bed to sit in the chair no injury sustained.  Tolerating a regular consistency diet.  Therapy evaluations completed and patient was admitted for a comprehensive rehab program.  Review of Systems  Constitutional: Negative for chills and fever.  HENT: Negative for  hearing loss.   Eyes: Negative for blurred vision and double vision.  Respiratory: Negative for cough and shortness of breath.   Cardiovascular: Positive for leg swelling. Negative for chest pain and palpitations.  Gastrointestinal: Positive for constipation. Negative for heartburn, nausea and vomiting.  Genitourinary: Negative for dysuria, flank pain and hematuria.  Musculoskeletal: Positive for joint pain and myalgias.  Skin: Negative for rash.  Neurological: Positive for seizures, weakness and headaches.  Psychiatric/Behavioral: Positive for depression. The patient has insomnia.        Anxiety as well as bipolar disorder  All other systems reviewed and are negative.  Past Medical History:  Diagnosis Date  . Anemia   . Anxiety   . Bipolar 1 disorder ([place])   . Common migraine 05/19/2015  . Depression   . Diabetes mellitus, type II ([place])   . Hypertension   . Irritable bowel syndrome (IBS)   . Mild mental retardation   . Obesity   . Partial complex seizure disorder with intractable epilepsy ([place]) 05/12/2014  . Seizures ([place])    intractable, sz 08/23/17  . Sleep apnea   . Stroke ([place])   . Type II or unspecified type diabetes mellitus without mention of complication, not stated as uncontrolled    Past Surgical History:  Procedure Laterality Date  . COLONOSCOPY     2012-normal , Dr Sharlett Iles  . ESOPHAGOGASTRODUODENOSCOPY     normal-Dr Patterson 2012  . LOOP RECORDER INSERTION N/A 05/30/2017   Procedure: Loop Recorder Insertion;  Surgeon: Constance Haw, MD;  Location: [facility];  Service: Cardiovascular;  Laterality: N/A;  . LOOP RECORDER REMOVAL N/A 03/04/2018   Procedure: LOOP  RECORDER REMOVAL;  Surgeon: Constance Haw, MD;  Location: [facility];  Service: Cardiovascular;  Laterality: N/A;  . MYRINGOTOMY WITH TUBE PLACEMENT    . MYRINGOTOMY WITH TUBE PLACEMENT Right 11/05/2017   Procedure: MYRINGOTOMY WITH TUBE PLACEMENT;  Surgeon: Melissa Montane, MD;   Location: [facility];  Service: ENT;  Laterality: Right;  right T Tube placement  . NASAL SINUS SURGERY    . TEE WITHOUT CARDIOVERSION N/A 05/30/2017   Procedure: TRANSESOPHAGEAL ECHOCARDIOGRAM (TEE);  Surgeon: Acie Fredrickson Wonda Cheng, MD;  Location: [facility] ENDOSCOPY;  Service: Cardiovascular;  Laterality: N/A;   Family History  Problem Relation Age of Onset  . Diabetes Mother        passed away from accidental death  . Hypertension Mother   . Bipolar disorder Father   . Diabetes Daughter   . Leukemia Daughter   . Ovarian cancer Maternal Aunt    Social History:  reports that she has never smoked. She has never used smokeless tobacco. She reports that she does not drink alcohol or use drugs. Allergies:  Allergies  Allergen Reactions  . Amoxicillin Itching    Has patient had a PCN reaction causing immediate rash, facial/tongue/throat swelling, SOB or lightheadedness with hypotension: yes Has patient had a PCN reaction causing severe rash involving mucus membranes or skin necrosis: no Has patient had a PCN reaction that required hospitalization: no Has patient had a PCN reaction occurring within the last 10 years: yes If all of the above answers are "NO", then may proceed with Cephalosporin use.  Marland Kitchen Lisinopril Swelling    Angioedema   . Hydrocodone Other (See Comments)    Depressed   . Tegretol [Carbamazepine] Swelling    Throat swells   Medications Prior to Admission  Medication Sig Dispense Refill  . ARIPiprazole (ABILIFY) 10 MG tablet Take 10 mg by mouth daily.    Marland Kitchen atorvastatin (LIPITOR) 10 MG tablet Take 1 tablet (10 mg total) by mouth daily at 6 PM. 90 tablet 3  . carvedilol (COREG) 3.125 MG tablet TAKE 1 TABLET (3.125 MG TOTAL) BY MOUTH DAILY. 30 tablet 1  . lamoTRIgine (LAMICTAL) 25 MG tablet TAKE 3 TABLETS TWICE DAILY (Patient taking differently: Take 75 mg by mouth 2 (two) times daily. ) 540 tablet 0  . sitaGLIPtin (JANUVIA) 50 MG tablet Take 1 tablet by mouth daily. 90 tablet 1  .  torsemide (DEMADEX) 10 MG tablet Take 10 mg by mouth daily.    . Vitamin D, Ergocalciferol, (DRISDOL) 1.25 MG (50000 UT) CAPS capsule TAKE 1 CAPSULE (50,000 UNITS TOTAL) BY MOUTH EVERY 7 (SEVEN) DAYS. (Patient taking differently: Take 50,000 Units by mouth every Tuesday. ) 5 capsule 0  . ARIPiprazole (ABILIFY) 5 MG tablet Take 1 tablet (5 mg total) by mouth daily. (Patient not taking: Reported on 10/12/2019) 30 tablet 3  . Blood Glucose Monitoring Suppl KIT Please supply kit covered by insurance and supplies for testing four times daily. E11.9 E10.9 1 each 0  . Blood Pressure Monitor KIT Take blood pressure reading twice a day for the next 2 weeks and then once a day. 1 each 0  . triamcinolone (KENALOG) 0.1 % paste Use as directed 1 application in the mouth or throat 2 (two) times daily. (Patient not taking: Reported on 08/23/2019) 5 g 0    Drug Regimen Review Drug regimen was reviewed and remains appropriate with no significant issues identified  Home: Home Living Family/patient expects to be discharged to:: Private residence Living Arrangements: (;ives with  daughter, Denton Ar and friend, [name]) Available Help at Discharge: Family, Bayard Males), Available 24 hours/day Type of Home: House Home Access: Stairs to enter [employer] of Steps: 4-5 Entrance Stairs-Rails: Right, Left Home Layout: One level Bathroom Shower/Tub: Chiropodist: Standard Bathroom Accessibility: Yes Home Equipment: None Additional Comments: pt lives with her daughter and a friend   Lives With: Significant other, Daughter   Functional History: Prior Function Level of Independence: Needs assistance Gait / Transfers Assistance Needed: independent without AD ADL's / Homemaking Assistance Needed: pt reports she did not cook due to memory deficits;but otherwise was independent with ADL Comments: daughter, Denton Ar manages her meds due to pt's memory issues  Functional Status:  Mobility: Bed  Mobility Overal bed mobility: Needs Assistance Bed Mobility: Supine to Sit Rolling: Supervision Sidelying to sit: Min guard, HOB elevated General bed mobility comments: min/guard to EOB with use of rails and HOB elevated.. Able to scoot to EOB with cues and no physical A. Transfers Overall transfer level: Needs assistance Equipment used: Rolling walker (2 wheeled) Transfers: Sit to/from Stand Sit to Stand: Min assist, +2 safety/equipment Stand pivot transfers: Min assist, +2 safety/equipment General transfer comment: multi-modal cueing for hand placement and MIN A for powering up and safety Ambulation/Gait Ambulation/Gait assistance: Min assist, +2 safety/equipment Gait Distance (Feet): 60 Feet Assistive device: Rolling walker (2 wheeled) Gait Pattern/deviations: Step-through pattern, Decreased step length - right, Decreased step length - left General Gait Details: no LOB noted with gait today. Slow cadence and deliberate steps with recliner follow today. Gait velocity: decreased Gait velocity interpretation: <1.31 ft/sec, indicative of household ambulator    ADL: ADL Overall ADL's : Needs assistance/impaired Eating/Feeding: Set up, Sitting Grooming: Set up, Sitting Upper Body Bathing: Min guard, Sitting Lower Body Bathing: Maximal assistance, Sitting/lateral leans Lower Body Bathing Details (indicate cue type and reason): maxA to access BLE due to large body habitus Upper Body Dressing : Minimal assistance, Sitting Lower Body Dressing: Maximal assistance, Sitting/lateral leans Lower Body Dressing Details (indicate cue type and reason): due to large body habitus Toilet Transfer: Moderate assistance, +2 for safety/equipment, Minimal assistance Toilet Transfer Details (indicate cue type and reason): minA+2 for safety for stand pivot transfer progressing to modA +2 for ambulation for simulated toilet transfer with 1 LOB requiring increased support to correct Toileting- Clothing  Manipulation and Hygiene: Moderate assistance, +2 for safety/equipment Functional mobility during ADLs: Moderate assistance, Minimal assistance, +2 for safety/equipment, +2 for physical assistance General ADL Comments: decreased activity tolerance, weakness, cognitive limitations, and decreased stability impacting safety with ADL completion   Cognition: Cognition Overall Cognitive Status: No family/caregiver present to determine baseline cognitive functioning Arousal/Alertness: Awake/alert Orientation Level: Oriented X4 Attention: Selective Selective Attention: Appears intact Memory: Appears intact(intact for recall of current symptoms and current situation/) Awareness: Appears intact Problem Solving: Appears intact Safety/Judgment: Appears intact Cognition Arousal/Alertness: Awake/alert Behavior During Therapy: WFL for tasks assessed/performed, Impulsive Overall Cognitive Status: No family/caregiver present to determine baseline cognitive functioning Area of Impairment: Attention, Memory, Following commands, Safety/judgement, Awareness, Problem solving Current Attention Level: Sustained Memory: Decreased recall of precautions Following Commands: Follows one step commands with increased time, Follows multi-step commands inconsistently Safety/Judgement: Decreased awareness of safety, Decreased awareness of deficits Awareness: Emergent Problem Solving: Slow processing, Decreased initiation, Difficulty sequencing, Requires verbal cues, Requires tactile cues General Comments: slow processing, but with increased time and cues, able to complete tasks  Physical Exam: Blood pressure 104/68, pulse 60, temperature 98.6 F (37 C), temperature source Oral, resp. rate 18, height  $'5\' 1"'O$  (1.549 m), weight 117.9 kg, SpO2 100 %. General: Alert and oriented x 3, No apparent distress HEENT: Head is normocephalic, atraumatic, PERRLA, EOMI, sclera anicteric, oral mucosa pink and moist, dentition intact,  ext ear canals clear,  Neck: Supple without JVD or lymphadenopathy Heart: Reg rate and rhythm. No murmurs rubs or gallops Chest: CTA bilaterally without wheezes, rales, or rhonchi; no distress Abdomen: Soft, non-tender, non-distended, bowel sounds positive. Extremities: No clubbing, cyanosis, or edema. Pulses are 2+ Skin: Clean and intact without signs of breakdown Neuro: Pt is cognitively appropriate with normal insight, memory, and awareness. Patient is alert makes good eye contact with examiner.  She follows simple commands.  She answers basic questions as her name and age.  Limited medical historian.  She was cooperative with exam.  Cranial nerves 2-12 are intact. Sensory exam is normal. Reflexes are 2+ in all 4's. Fine motor coordination is intact. No tremors. Motor function is grossly 5/5, except 4/5 through RLE. Musculoskeletal: Full ROM, No pain with AROM or PROM in the neck, trunk, or extremities. Posture appropriate Psych: Pt's affect is appropriate. Pt is cooperative   Results for orders placed or performed during the hospital encounter of 10/11/19 (from the past 48 hour(s))  Glucose, capillary     Status: Abnormal   Collection Time: 10/13/19 11:22 AM  Result Value Ref Range   Glucose-Capillary 109 (H) 70 - 99 mg/dL   Comment 1 Notify RN    Comment 2 Document in Chart   Glucose, capillary     Status: Abnormal   Collection Time: 10/13/19  3:26 PM  Result Value Ref Range   Glucose-Capillary 165 (H) 70 - 99 mg/dL   Comment 1 Notify RN    Comment 2 Document in Chart   Glucose, capillary     Status: Abnormal   Collection Time: 10/13/19  9:30 PM  Result Value Ref Range   Glucose-Capillary 107 (H) 70 - 99 mg/dL   Comment 1 Notify RN    Comment 2 Document in Chart   Glucose, capillary     Status: Abnormal   Collection Time: 10/14/19  7:13 AM  Result Value Ref Range   Glucose-Capillary 113 (H) 70 - 99 mg/dL   Comment 1 Notify RN    Comment 2 Document in Chart   Glucose,  capillary     Status: Abnormal   Collection Time: 10/14/19 11:56 AM  Result Value Ref Range   Glucose-Capillary 151 (H) 70 - 99 mg/dL   Comment 1 Notify RN    Comment 2 Document in Chart   Glucose, capillary     Status: Abnormal   Collection Time: 10/14/19  3:46 PM  Result Value Ref Range   Glucose-Capillary 106 (H) 70 - 99 mg/dL   Comment 1 Notify RN    Comment 2 Document in Chart   Glucose, capillary     Status: Abnormal   Collection Time: 10/14/19  9:29 PM  Result Value Ref Range   Glucose-Capillary 140 (H) 70 - 99 mg/dL   No results found.     Medical Problem List and Plan: 1.  Right side weakness secondary to posterior left frontal lobe infarct left ACA territory embolic pattern as well as history of CVA 05/2017 2.  Antithrombotics: -DVT/anticoagulation: Lovenox  -antiplatelet therapy: Aspirin 81 mg daily, Plavix 75 mg daily x3 weeks then aspirin alone 3. Pain Management: Tylenol as needed 4. Mood/bipolar disorder/mild mental retardation: Abilify 10 mg daily  -antipsychotic agents: N/A 5. Neuropsych: This patient  is capable of making decisions on her own behalf. 6. Skin/Wound Care: Routine skin checks 7. Fluids/Electrolytes/Nutrition: Routine in and outs with follow-up chemistries 8.  Seizure disorder.  Lamictal increased to 100 mg twice daily.  Follow-up neurology services 9.  Cardiomyopathy with chronic diastolic and systolic congestive heart failure.  Monitor for any signs of fluid overload.  Cardiology follow-up await plan for TEE inpatient versus outpatient. 10.  Permissive hypertension.  Hydralazine 10 mg twice daily, Isordil 10 mg twice daily 11.  Diabetes mellitus with peripheral neuropathy.  Hemoglobin A1c 6.5.  SSI.  Patient on Januvia 50 mg daily prior to admission.  Resume as needed 12.  CKD stage III.  Creatinine 1.24-1.52.  Follow-up chemistries 13.  Morbid obesity.  BMI 49.13.  Dietary follow-up 14.  Hyperlipidemia.  Lipitor 15.OSA.CPAP 16. Insomnia: Mrs.  Vroom sleeps at midnight and wakes at 4am. She often sleeps poorly. She is on Ambien '5mg'$  PRN HS for insomnia. Recommended that she drink chamomile tea at 10pm to help her feel drowsy, with a goal of sleeping at 11pm.  17. Vitamin D Deficiency: Continue Ergocalciferol 50,000 U every Tuesday.   Lavon Paganini Angiulli, PA-C 10/15/2019   I have personally performed a face to face diagnostic evaluation, including, but not limited to relevant history and physical exam findings, of this patient and developed relevant assessment and plan.  Additionally, I have reviewed and concur with the physician assistant's documentation above.  Leeroy Cha, MD

## 2019-10-16 ENCOUNTER — Inpatient Hospital Stay (HOSPITAL_COMMUNITY): Payer: Medicare HMO

## 2019-10-16 ENCOUNTER — Inpatient Hospital Stay (HOSPITAL_COMMUNITY): Payer: Medicare HMO | Admitting: Physical Therapy

## 2019-10-16 ENCOUNTER — Inpatient Hospital Stay (HOSPITAL_COMMUNITY): Payer: Medicare HMO | Admitting: Occupational Therapy

## 2019-10-16 DIAGNOSIS — I639 Cerebral infarction, unspecified: Secondary | ICD-10-CM

## 2019-10-16 LAB — GLUCOSE, CAPILLARY
Glucose-Capillary: 135 mg/dL — ABNORMAL HIGH (ref 70–99)
Glucose-Capillary: 114 mg/dL — ABNORMAL HIGH (ref 70–99)
Glucose-Capillary: 117 mg/dL — ABNORMAL HIGH (ref 70–99)
Glucose-Capillary: 108 mg/dL — ABNORMAL HIGH (ref 70–99)
Glucose-Capillary: 123 mg/dL — ABNORMAL HIGH (ref 70–99)

## 2019-10-16 MED ORDER — BLOOD PRESSURE CONTROL BOOK
Freq: Once | Status: AC
  Administered 2019-10-16: 15:00:00
  Filled 2019-10-16: qty 1

## 2019-10-16 MED ORDER — LIVING WELL WITH DIABETES BOOK
Freq: Once | Status: AC
  Administered 2019-10-16: 15:00:00
  Filled 2019-10-16: qty 1

## 2019-10-16 NOTE — Evaluation (Signed)
Physical Therapy Assessment and Plan  Patient Details  Name: Brandi Gomez MRN: [id number] Date of Birth: Mar 14, 1971  PT Diagnosis: Hemiplegia dominant and Muscle weakness Rehab Potential: Good ELOS: 6-9 days   Today's Date: 10/16/2019 PT Individual Time: 0805-0900 PT Individual Time Calculation (min): 55 min    Problem List:  Patient Active Problem List   Diagnosis Date Noted  . Ischemic cerebrovascular accident (CVA) of frontal lobe ([place]) 10/15/2019  . Acute CVA (cerebrovascular accident) ([place]) 10/12/2019  . HLD (hyperlipidemia) 10/12/2019  . CKD (chronic kidney disease) stage 3, GFR 30-59 ml/min 10/12/2019  . Acute ischemic stroke ([place]) 10/12/2019  . Chronic combined systolic and diastolic heart failure ([place])   . Depression 07/14/2019  . Bipolar disorder ([place]) 05/13/2019  . Bipolar 1 disorder ([place]) 05/13/2019  . Adjustment disorder with anxiety   . History of recent stroke 08/06/2017  . OSA (obstructive sleep apnea)   . Cryptogenic stroke ([place]) 05/20/2017  . Controlled type 2 diabetes mellitus without complication, without long-term current use of insulin ([place]) 05/08/2017  . Essential hypertension 07/27/2008  . Seizure disorder ([place]) 02/05/2007    Past Medical History:  Past Medical History:  Diagnosis Date  . Anemia   . Anxiety   . Bipolar 1 disorder ([place])   . Common migraine 05/19/2015  . Depression   . Diabetes mellitus, type II ([place])   . Hypertension   . Irritable bowel syndrome (IBS)   . Mild mental retardation   . Obesity   . Partial complex seizure disorder with intractable epilepsy ([place]) 05/12/2014  . Seizures ([place])    intractable, sz 08/23/17  . Sleep apnea   . Stroke ([place])   . Type II or unspecified type diabetes mellitus without mention of complication, not stated as uncontrolled    Past Surgical History:  Past Surgical History:  Procedure Laterality Date  . COLONOSCOPY     2012-normal , Dr Sharlett Iles  . ESOPHAGOGASTRODUODENOSCOPY     normal-Dr  Patterson 2012  . LOOP RECORDER INSERTION N/A 05/30/2017   Procedure: Loop Recorder Insertion;  Surgeon: Constance Haw, MD;  Location: [place] [facility];  Service: Cardiovascular;  Laterality: N/A;  . LOOP RECORDER REMOVAL N/A 03/04/2018   Procedure: LOOP RECORDER REMOVAL;  Surgeon: Constance Haw, MD;  Location: [place] [facility];  Service: Cardiovascular;  Laterality: N/A;  . MYRINGOTOMY WITH TUBE PLACEMENT    . MYRINGOTOMY WITH TUBE PLACEMENT Right 11/05/2017   Procedure: MYRINGOTOMY WITH TUBE PLACEMENT;  Surgeon: Melissa Montane, MD;  Location: ;  Service: ENT;  Laterality: Right;  right T Tube placement  . NASAL SINUS SURGERY    . TEE WITHOUT CARDIOVERSION N/A 05/30/2017   Procedure: TRANSESOPHAGEAL ECHOCARDIOGRAM (TEE);  Surgeon: Thayer Headings, MD;  [facility]: [facility];  Service: Cardiovascular;  Laterality: N/A;    Assessment & Plan Clinical Impression: Patient is a 48 year old right-handed female with history of anxiety, bipolar disorder, diabetes mellitus, sleep apnea, chronic systolic and diastolic heart failure, CKD stage III, morbid obesity BMI 49.13, hypertension, mild mental retardation, seizure disorder followed by Dr. Jannifer Franklin maintained on Lamictal, prior cryptogenic stroke with loop recorder placement 05/30/2017 showing no A. fib and removed 03/04/2018 at patient request as well as TEE at that time unremarkable no PFO.  Per chart review patient lives with her daughter.  1 level home with 4-5 steps to entry.  Family assistance provided as needed.  Patient was independent with ADLs but did not cook due to some memory deficits.  Presented  10/12/2019 with right leg weakness.  MRI of the brain showed a 12 mm acute ischemic nonhemorrhagic posterior left frontal lobe infarction, left ACA distribution.  No associated hemorrhage or mass-effect.  MRA with no intracranial large vessel occlusion or proximal high-grade stenosis.  Echocardiogram with ejection fraction of 40%.  Patient  with noted breakthrough seizures x2.  EEG showed no seizure activity but frequent bifrontal sharps and patient's Lamictal was increased to 100 mg twice daily   Neurology consulted maintain on aspirin and Plavix x3 weeks then aspirin alone for seizure prophylaxis.  Subcutaneous Lovenox for DVT prophylaxis.  Awaiting plan by cardiology for TEE to be completed while in the hospital versus outpatient to assess for thrombus.  Patient transferred to CIR on 10/15/2019 .   Patient currently requires min with mobility secondary to muscle weakness, unbalanced muscle activation and decreased standing balance, hemiplegia and decreased balance strategies.  Prior to hospitalization, patient was modified independent  with mobility and lived with   in a House home.  Home access is 4-5Stairs to enter.  Patient will benefit from skilled PT intervention to maximize safe functional mobility, minimize fall risk and decrease caregiver burden for planned discharge home with 24 hour supervision.  Anticipate patient will benefit from follow up [place] at discharge.  PT - End of Session Activity Tolerance: Tolerates 10 - 20 min activity with multiple rests PT Assessment Rehab Potential (ACUTE/IP ONLY): Good PT Barriers to Discharge: Home environment access/layout PT Patient demonstrates impairments in the following area(s): Balance;Endurance;Motor;Perception;Safety;Sensory PT Transfers Functional Problem(s): Bed Mobility;Bed to Chair;Car;Furniture;Floor PT Locomotion Functional Problem(s): Ambulation;Stairs;Wheelchair Mobility PT Plan PT Intensity: Minimum of 1-2 x/day ,45 to 90 minutes PT Frequency: 5 out of 7 days PT Duration Estimated Length of Stay: 6-9 days PT Treatment/Interventions: Ambulation/gait training;Balance/vestibular training;Cognitive remediation/compensation;Discharge planning;Community reintegration;DME/adaptive equipment instruction;Functional mobility training;Disease management/prevention;Patient/family  education;Pain management;Neuromuscular re-education;Psychosocial support;Skin care/wound management;Splinting/orthotics;Therapeutic Exercise;Therapeutic Activities;UE/LE Strength taining/ROM;UE/LE Coordination activities;Visual/perceptual remediation/compensation;Stair training;Wheelchair propulsion/positioning PT Transfers Anticipated Outcome(s): supervision assist with LRAD PT Locomotion Anticipated Outcome(s): Ambulatory with supervision assist and LRAD PT Recommendation Follow Up Recommendations: Home health PT Patient destination: Home Equipment Recommended: Rolling walker with 5" wheels  Skilled Therapeutic Intervention Pt received sitting in WC and agreeable to PT  PT instructed patient in PT Evaluation and initiated treatment intervention; see below for results. PT educated patient in [place], rehab potential, rehab goals, and discharge recommendations. Gait training with min assist and no AD as well as min assist with RW up to 48f. Stair negotiation as listed below and car transfer with min assist and no AD from PT, min cues for safety. [facility] returned to [facility] and [facility] and all needs met.      PT Evaluation Precautions/Restrictions   fall  General   Vital Signs Pain   denies Home Living/Prior Functioning Home Living Available Help at Discharge: Family;Friend(s);Available 24 hours/day Type of Home: House Home Access: Stairs to enter [employer] Steps: 4-5 Entrance Stairs-Rails: Right;Left Home Layout: One level Bathroom Shower/Tub: TChiropodist Standard Additional Comments: pt lives with her daughter and a friend  Prior Function Level of Independence: Independent with gait;Independent with basic ADLs;Needs assistance with homemaking Meal Prep: Moderate  Able to Take Stairs?: Yes Driving: No Vocation: On disability Comments: daughter, BDenton Armanages her meds due to pt's memory  issues Vision/Perception  Perception Perception: Within Functional Limits Praxis Praxis: Intact  Cognition   mild baseline MR.  Sensation Sensation Light Touch: Appears Intact Proprioception: Appears Intact  Coordination Gross Motor Movements are Fluid and Coordinated: Yes Fine Motor Movements are Fluid and Coordinated: Yes Finger Nose Finger Test: mild decrease in speed bil Heel Shin Test: WFL R and L Motor  Motor Motor: Hemiplegia Motor - Skilled Clinical Observations: mild R side hemiplegia  Mobility Bed Mobility Bed Mobility: Rolling Right;Rolling Left;Sit to Supine;Supine to Sit Rolling Right: Supervision/verbal cueing Rolling Left: Supervision/Verbal cueing Supine to Sit: Supervision/Verbal cueing Sit to Supine: Supervision/Verbal cueing Transfers Transfers: Sit to Stand;Stand Pivot Transfers Sit to Stand: Minimal Assistance - Patient > 75% Stand Pivot Transfers: Minimal Assistance - Patient > 75% Stand Pivot Transfer Details: Verbal cues for precautions/safety Transfer (Assistive device): None Locomotion  Gait Ambulation: Yes Gait Assistance: Minimal Assistance - Patient > 75% Gait Distance (Feet): 50 Feet Assistive device: None Gait Gait: Yes Gait Pattern: Impaired Gait Pattern: Decreased step length - right;Lateral hip instability Stairs / Additional Locomotion Stairs: Yes Stairs Assistance: Minimal Assistance - Patient > 75% Stair Management Technique: Two rails Number of Stairs: 4 Height of Stairs: 6 Wheelchair Mobility Wheelchair Mobility: Yes Wheelchair Assistance: Chartered loss adjuster: Both upper extremities Wheelchair Parts Management: Needs assistance Distance: 75  Trunk/Postural Assessment    WFL Balance Balance Balance Assessed: Yes Static Sitting Balance Static Sitting - Balance Support: No upper extremity supported Static Sitting - Level of Assistance: 6: Modified independent (Device/Increase time) Dynamic  Sitting Balance Dynamic Sitting - Balance Support: No upper extremity supported Dynamic Sitting - Level of Assistance: 5: Stand by assistance Static Standing Balance Static Standing - Balance Support: No upper extremity supported Static Standing - Level of Assistance: 4: Min assist Dynamic Standing Balance Dynamic Standing - Balance Support: No upper extremity supported Dynamic Standing - Level of Assistance: 3: Mod assist;4: Min assist Extremity Assessment      RLE Assessment RLE Assessment: Exceptions to [facility] General Strength Comments: grossly 4+/5 except hip flexion and knee extension 4/5. LLE Assessment LLE Assessment: Within Functional Limits General Strength Comments: grossly 4+/5 proximal to distal    Refer to Care Plan for Long Term Goals  Recommendations for other services: None   Discharge Criteria: Patient will be discharged from PT if patient refuses treatment 3 consecutive times without medical reason, if treatment goals not met, if there is a change in medical status, if patient makes no progress towards goals or if patient is discharged from hospital.  The above assessment, treatment plan, treatment alternatives and goals were discussed and mutually agreed upon: by patient  Lorie Phenix 10/16/2019, 9:09 AM

## 2019-10-16 NOTE — Progress Notes (Signed)
Mild MR at baseline

## 2019-10-16 NOTE — Evaluation (Signed)
Speech Language Pathology Assessment and Plan  Patient Details  Name: Brandi Gomez MRN: [id number] Date of Birth: [birth year]  SLP Diagnosis:   N/A Rehab Potential:  N/A ELOS:   N/A   Today's Date: 10/16/2019 SLP Individual Time: 9390-3009 SLP Individual Time Calculation (min): 58 min   Problem List:  Patient Active Problem List   Diagnosis Date Noted  . Ischemic cerebrovascular accident (CVA) of frontal lobe ([place]) 10/15/2019  . Acute CVA (cerebrovascular accident) ([place]) 10/12/2019  . HLD (hyperlipidemia) 10/12/2019  . CKD (chronic kidney disease) stage 3, GFR 30-59 ml/min 10/12/2019  . Acute ischemic stroke ([place]) 10/12/2019  . Chronic combined systolic and diastolic heart failure ([place])   . Depression 07/14/2019  . Bipolar disorder ([place]) 05/13/2019  . Bipolar 1 disorder ([place]) 05/13/2019  . Adjustment disorder with anxiety   . History of recent stroke 08/06/2017  . OSA (obstructive sleep apnea)   . Cryptogenic stroke ([place]) 05/20/2017  . Controlled type 2 diabetes mellitus without complication, without long-term current use of insulin ([place]) 05/08/2017  . Essential hypertension 07/27/2008  . Seizure disorder ([place]) 02/05/2007   Past Medical History:  Past Medical History:  Diagnosis Date  . Anemia   . Anxiety   . Bipolar 1 disorder ([place])   . Common migraine 05/19/2015  . Depression   . Diabetes mellitus, type II ([place])   . Hypertension   . Irritable bowel syndrome (IBS)   . Mild mental retardation   . Obesity   . Partial complex seizure disorder with intractable epilepsy ([place]) 05/12/2014  . Seizures ([place])    intractable, sz 08/23/17  . Sleep apnea   . Stroke ([place])   . Type II or unspecified type diabetes mellitus without mention of complication, not stated as uncontrolled    Past Surgical History:  Past Surgical History:  Procedure Laterality Date  . COLONOSCOPY     2012-normal , Dr Sharlett Iles  . ESOPHAGOGASTRODUODENOSCOPY     normal-Dr Patterson 2012  . LOOP  RECORDER INSERTION N/A 05/30/2017   Procedure: Loop Recorder Insertion;  Surgeon: Constance Haw, MD;  Location: [place] [facility];  Service: Cardiovascular;  Laterality: N/A;  . LOOP RECORDER REMOVAL N/A 03/04/2018   Procedure: LOOP RECORDER REMOVAL;  Surgeon: Constance Haw, MD;  Location: [place] [facility];  Service: Cardiovascular;  Laterality: N/A;  . MYRINGOTOMY WITH TUBE PLACEMENT    . MYRINGOTOMY WITH TUBE PLACEMENT Right 11/05/2017   Procedure: MYRINGOTOMY WITH TUBE PLACEMENT;  Surgeon: Melissa Montane, MD;  Location: [place];  Service: ENT;  Laterality: Right;  right T Tube placement  . NASAL SINUS SURGERY    . TEE WITHOUT CARDIOVERSION N/A 05/30/2017   Procedure: TRANSESOPHAGEAL ECHOCARDIOGRAM (TEE);  Surgeon: Thayer Headings, MD;  Location: [facility] ENDOSCOPY;  Service: Cardiovascular;  Laterality: N/A;    Assessment / Plan / Recommendation Clinical Impression 48 year old right-handed female with history of anxiety, bipolar disorder, diabetes mellitus, sleep apnea, chronic systolic and diastolic heart failure, CKD stage III, morbid obesity BMI 49.13, hypertension, mild mental retardation, seizure disorder followed by Dr. Jannifer Franklin maintained on Lamictal, prior cryptogenic stroke with loop recorder placement 05/30/2017 showing no A. fib and removed 03/04/2018 at patient request as well as TEE at that time unremarkable no PFO. Presented 10/12/2019 with right leg weakness. MRI of the brain showed a 12 mm acute ischemic nonhemorrhagic posterior left frontal lobe infarction, left ACA distribution. No associated hemorrhage or mass-effect. MRA with no intracranial large vessel occlusion or proximal high-grade stenosis. Echocardiogram with  ejection fraction of 40%. Patient with noted breakthrough seizures x2. EEG showed no seizure activity but frequent bifrontal sharps and patient's Lamictal was increased to 100 mg twice daily Neurology consulted maintain on aspirin and Plavix x3 weeks then  aspirin alone for seizure prophylaxis. Subcutaneous Lovenox for DVT prophylaxis.Cardiology for TEE to be completedas an outpatientto assess for thrombus. Patient did have a fall last evening 10/14/2019 attempting to get out of bed to sit in the chair no injury sustained. Tolerating a regular consistency diet.  Pt presents with baseline mild cognitive linguistic deficits, impairments include mildly complex problem solving, delayed processing and short term recall. Pt expressed exacerbation of delayed processing and memory deficits from acute CVA, therefore SLP conducted thorough cognitive evaluation. Pt scored 22 out 30 on MOCA basic (n=26) with deficits in fluency/generative naming, calculations, and delayed recall. Pt demonstrated functional problem solving in money management task , counting displaying change, reflect pervious level of function per pt. Pt demonstrated ability to follow 1-3 step directions with extra time, recalled events from today with occasional cues, selective attention, awareness of deficits/situation and safety. Pt confirmed today's demonstration of cognitive linguistic skilled reflected baseline cognitive ability due to previous CVA. SLP attempted to call live in friend (of 2 years) and daughter to confirm baseline, but was unsuccessful in reaching family. SLP communicated with evaluating PT, in which he noted no cognitive concerns for participation in skilled services. Pt presented with functional speech and language skills. SLP provided education for strategies in recall and extra time for processing, all questions were answered to satisfaction. SLP recommends no further ST services at this time due to cognitive linguistic abilities reflecting baseline function.    Skilled Therapeutic Interventions          N/A  SLP Assessment  Patient does not need any further Speech Lanaguage Pathology Services    Recommendations  SLP Diet Recommendations: Thin Liquid Administration  via: Straw;Cup Medication Administration: Whole meds with liquid Supervision: Patient able to self feed Postural Changes and/or Swallow Maneuvers: Seated upright 90 degrees Oral Care Recommendations: Oral care BID Patient destination: Home Follow up Recommendations: None Equipment Recommended: None recommended by SLP    SLP Frequency  N/A  SLP Duration  SLP Intensity  SLP Treatment/Interventions  N/A   N/A    N/A   Pain Pain Assessment Pain Score: 0-No pain  Prior Functioning Cognitive/Linguistic Baseline: Baseline deficits Baseline deficit details: see report for full details Type of Home: House  Lives With: Significant other;Daughter Available Help at Discharge: Family;Friend(s);Available 24 hours/day Education: 12th Vocation: On disability  SLP Evaluation Cognition Overall Cognitive Status: History of cognitive impairments - at baseline Arousal/Alertness: Awake/alert Orientation Level: Oriented X4 Attention: Selective Selective Attention: Appears intact Memory: Impaired Memory Impairment: Retrieval deficit;Decreased recall of new information Awareness: Appears intact Problem Solving: Appears intact Safety/Judgment: Appears intact  Comprehension Auditory Comprehension Overall Auditory Comprehension: Appears within functional limits for tasks assessed Visual Recognition/Discrimination Discrimination: Within Function Limits Reading Comprehension Reading Status: Not tested Expression Verbal Expression Overall Verbal Expression: Appears within functional limits for tasks assessed Written Expression Dominant Hand: Right Written Expression: Within Functional Limits Oral Motor Oral Motor/Sensory Function Overall Oral Motor/Sensory Function: Within functional limits Motor Speech Overall Motor Speech: Appears within functional limits for tasks assessed Respiration: Within functional limits Phonation: Normal   Short Term Goals: No short term goals  set  Refer to Care Plan for Long Term Goals  Recommendations for other services: None   Discharge Criteria: Patient will be discharged from  SLP if patient refuses treatment 3 consecutive times without medical reason, if treatment goals not met, if there is a change in medical status, if patient makes no progress towards goals or if patient is discharged from hospital.  The above assessment, treatment plan, treatment alternatives and goals were discussed and mutually agreed upon: by patient  Jaydyn Menon  [facility] 10/16/2019, 12:26 PM

## 2019-10-16 NOTE — Evaluation (Signed)
Occupational Therapy Assessment and Plan  Patient Details  Name: Brandi Gomez MRN: [id number] Date of Birth: Aug 24, 1971  OT Diagnosis: abnormal posture, cognitive deficits, hemiplegia affecting dominant side and muscle weakness (generalized) Rehab Potential: Rehab Potential (ACUTE ONLY): Excellent ELOS: 7-10 days   Today's Date: 10/16/2019 OT Individual Time: 1255-1405 OT Individual Time Calculation (min): 70 min     Problem List:  Patient Active Problem List   Diagnosis Date Noted  . Ischemic cerebrovascular accident (CVA) of frontal lobe ([place]) 10/15/2019  . Acute CVA (cerebrovascular accident) ([place]) 10/12/2019  . HLD (hyperlipidemia) 10/12/2019  . CKD (chronic kidney disease) stage 3, GFR 30-59 ml/min 10/12/2019  . Acute ischemic stroke ([place]) 10/12/2019  . Chronic combined systolic and diastolic heart failure ([place])   . Depression 07/14/2019  . Bipolar disorder ([place]) 05/13/2019  . Bipolar 1 disorder ([place]) 05/13/2019  . Adjustment disorder with anxiety   . History of recent stroke 08/06/2017  . OSA (obstructive sleep apnea)   . Cryptogenic stroke ([place]) 05/20/2017  . Controlled type 2 diabetes mellitus without complication, without long-term current use of insulin ([place]) 05/08/2017  . Essential hypertension 07/27/2008  . Seizure disorder ([place]) 02/05/2007    Past Medical History:  Past Medical History:  Diagnosis Date  . Anemia   . Anxiety   . Bipolar 1 disorder ([place])   . Common migraine 05/19/2015  . Depression   . Diabetes mellitus, type II ([place])   . Hypertension   . Irritable bowel syndrome (IBS)   . Mild mental retardation   . Obesity   . Partial complex seizure disorder with intractable epilepsy ([place]) 05/12/2014  . Seizures ([place])    intractable, sz 08/23/17  . Sleep apnea   . Stroke ([place])   . Type II or unspecified type diabetes mellitus without mention of complication, not stated as uncontrolled    Past Surgical History:  Past Surgical History:  Procedure  Laterality Date  . COLONOSCOPY     2012-normal , Dr Sharlett Iles  . ESOPHAGOGASTRODUODENOSCOPY     normal-Dr Patterson 2012  . LOOP RECORDER INSERTION N/A 05/30/2017   Procedure: Loop Recorder Insertion;  Surgeon: Constance Haw, MD;  Location: [facility];  Service: Cardiovascular;  Laterality: N/A;  . LOOP RECORDER REMOVAL N/A 03/04/2018   Procedure: LOOP RECORDER REMOVAL;  Surgeon: Constance Haw, MD;  Location: [facility];  Service: Cardiovascular;  Laterality: N/A;  . MYRINGOTOMY WITH TUBE PLACEMENT    . MYRINGOTOMY WITH TUBE PLACEMENT Right 11/05/2017   Procedure: MYRINGOTOMY WITH TUBE PLACEMENT;  Surgeon: Melissa Montane, MD;  Location: [facility];  Service: ENT;  Laterality: Right;  right T Tube placement  . NASAL SINUS SURGERY    . TEE WITHOUT CARDIOVERSION N/A 05/30/2017   Procedure: TRANSESOPHAGEAL ECHOCARDIOGRAM (TEE);  Surgeon: Acie Fredrickson Wonda Cheng, MD;  Location[facility];  Service: Cardiovascular;  Laterality: N/A;    Assessment & Plan Clinical Impression: Brandi Gomez is a 48 year old right-handed female with history of anxiety, bipolar disorder, diabetes mellitus, sleep apnea, chronic systolic and diastolic heart failure, CKD stage III, morbid obesity BMI 49.13, hypertension, mild mental retardation, seizure disorder followed by Dr. Jannifer Gomez maintained on Lamictal, prior cryptogenic stroke with loop recorder placement 05/30/2017 showing no A. fib and removed 03/04/2018 at patient request as well as TEE at that time unremarkable no PFO.  Per chart review patient lives with her daughter.  1 level home with 4-5 steps to entry.  Family assistance provided as needed.  Patient was  independent with ADLs but did not cook due to some memory deficits.  Presented 10/12/2019 with right leg weakness.  MRI of the brain showed a 12 mm acute ischemic nonhemorrhagic posterior left frontal lobe infarction, left ACA distribution.  No associated hemorrhage or mass-effect.  MRA with no  intracranial large vessel occlusion or proximal high-grade stenosis.  Echocardiogram with ejection fraction of 40%.  Patient with noted breakthrough seizures x2.  EEG showed no seizure activity but frequent bifrontal sharps and patient's Lamictal was increased to 100 mg twice daily   Neurology consulted maintain on aspirin and Plavix x3 weeks then aspirin alone for seizure prophylaxis.  Subcutaneous Lovenox for DVT prophylaxis.  Awaiting plan by cardiology for TEE to be completed while in the hospital versus outpatient to assess for thrombus.  Patient did have a fall last evening 10/14/2019 attempting to get out of bed to sit in the chair no injury sustained.  Tolerating a regular consistency diet.  Therapy evaluations completed and patient was admitted for a comprehensive rehab program.   Patient currently requires mod with basic self-care skills secondary to muscle weakness, decreased cardiorespiratoy endurance, decreased coordination, decreased safety awareness, decreased memory and delayed processing and decreased standing balance and hemiplegia.  Prior to hospitalization, patient could complete BADLs with independent .  Patient will benefit from skilled intervention to increase independence with basic self-care skills prior to discharge home with family and friend support.  Anticipate patient will require 24 hour supervision and follow up home health.  OT - End of Session Endurance Deficit: Yes OT Assessment Rehab Potential (ACUTE ONLY): Excellent OT Barriers to Discharge: Medical stability;Weight OT Patient demonstrates impairments in the following area(s): Balance;Cognition;Endurance;Motor;Safety OT Basic ADL's Functional Problem(s): Grooming;Bathing;Dressing;Toileting OT Advanced ADL's Functional Problem(s): Simple Meal Preparation OT Transfers Functional Problem(s): Toilet;Tub/Shower OT Additional Impairment(s): None OT Plan OT Intensity: Minimum of 1-2 x/day, 45 to 90 minutes OT Frequency:  5 out of 7 days OT Duration/Estimated Length of Stay: 7-10 days OT Treatment/Interventions: Balance/vestibular training;DME/adaptive equipment instruction;Patient/family education;Therapeutic Activities;Wheelchair propulsion/positioning;Cognitive remediation/compensation;Functional electrical stimulation;Psychosocial support;Therapeutic Exercise;UE/LE Strength taining/ROM;Self Care/advanced ADL retraining;Functional mobility training;Community reintegration;Discharge planning;Neuromuscular re-education;UE/LE Coordination activities;Pain management;Disease mangement/prevention OT Self Feeding Anticipated Outcome(s): No goal OT Basic Self-Care Anticipated Outcome(s): Supervision OT Toileting Anticipated Outcome(s): Supervision OT Bathroom Transfers Anticipated Outcome(s): Supervision OT Recommendation Recommendations for Other Services: Therapeutic Recreation consult Therapeutic Recreation Interventions: Pet therapy;Outing/community reintergration Patient destination: Home Follow Up Recommendations: Home health OT Equipment Recommended: To be determined Skilled Therapeutic Intervention Skilled OT session completed with focus on initial evaluation, education on OT role/POC, and establishment of patient-centered goals.   Pt greeted in the recliner with no c/o pain. Requesting to shower. She completed toileting (using standard toilet with continent bladder void), bathing (at shower level, sit<stand), dressing (EOB sit<stand using RW), and oral care/handwashing (standing at sink) during session. All functional transfers completed at ambulatory level using device with Min A. Steady assist for dynamic standing activity and close supervision for dynamic sitting tasks. Note that she had a few small LOBs when standing but was able to self correct. Pt assisted OT with fastening clasps to her hospital gowns. B [place] appeared WNL during this task and also during sinkside activity. Pt used R UE at dominant level  throughout. Min A to reach her feet functionally, though pt was able to prop Lt foot up on the bed to don her gripper sock. At end of session she wanted to remain in the recliner. Left her with all needs within reach and safety belt fastened.  OT Evaluation Precautions/Restrictions  Precautions Precautions: Fall General Chart Reviewed: Yes Family/Caregiver Present: No Vital Signs Therapy Vitals Temp: 98.3 F (36.8 C) Temp Source: Oral Pulse Rate: 69 Resp: 17 BP: 128/70 Patient Position (if appropriate): Sitting Oxygen Therapy SpO2: 100 % O2 Device: Room Air Pain Pain Assessment Pain Score: 0-No pain Home Living/Prior Functioning Home Living Family/patient expects to be discharged to:: Private residence Living Arrangements: Children, Non-relatives/Friends Available Help at Discharge: Family, Friend(s), Available 24 hours/day Type of Home: House Home Access: Stairs to enter Technical brewer of Steps: 4-5 Entrance Stairs-Rails: Right, Left Home Layout: One level Bathroom Shower/Tub: Government social research officer Accessibility: Yes Additional Comments: pt lives with her daughter and a friend   Lives With: Significant other, Daughter IADL History Homemaking Responsibilities: Yes Meal Prep Responsibility: No Laundry Responsibility: Primary Shopping Responsibility: No Homemaking Comments: Pt does the laundry and some light vacuuming. She reports she wasn't using any AD during functional household ambulation. Her friend [name] assisted with cooking and driving needs Education: 12th Leisure and Hobbies: Fishing with [name] Prior Function Level of Independence: Independent with gait, Independent with basic ADLs, Needs assistance with homemaking Meal Prep: Moderate Shopping: Total  Able to Take Stairs?: Yes Driving: No(per pt due to epilepsy) Vocation: On disability Comments: daughter, D[place] Ar manages her meds due to pt's memory  issues ADL ADL Eating: Not assessed Grooming: Contact guard Where Assessed-Grooming: Standing at sink Upper Body Bathing: Supervision/safety Where Assessed-Upper Body Bathing: Shower Lower Body Bathing: Minimal assistance Where Assessed-Lower Body Bathing: Shower Upper Body Dressing: Minimal assistance Where Assessed-Upper Body Dressing: Edge of bed Lower Body Dressing: Maximal assistance Where Assessed-Lower Body Dressing: Edge of bed Toileting: Minimal assistance Where Assessed-Toileting: Glass blower/designer: Psychiatric nurse Method: Ambulating(RW) Science writer: Energy manager: Environmental education officer Method: Ambulating(RW) Youth worker: Gaffer Baseline Vision/History: Wears glasses Wears Glasses: Reading only Patient Visual Report: Blurring of vision Perception  Perception: Within Functional Limits Praxis Praxis: Intact Cognition Overall Cognitive Status: History of cognitive impairments - at baseline Arousal/Alertness: Awake/alert Orientation Level: Person;Place;Situation Person: Oriented Place: Oriented Situation: Oriented Year: 2020 Month: November Day of Week: Correct Memory: Impaired Memory Impairment: Retrieval deficit;Decreased recall of new information Immediate Memory Recall: Sock;Bed;Blue Memory Recall Sock: Without Cue Memory Recall Blue: With Cue Memory Recall Bed: Without Cue Attention: Selective Selective Attention: Appears intact Awareness: Appears intact Problem Solving: Appears intact Safety/Judgment: Appears intact Sensation Sensation Light Touch: Appears Intact Coordination Gross Motor Movements are Fluid and Coordinated: Yes Fine Motor Movements are Fluid and Coordinated: Yes Finger Nose Finger Test: Decreased speed Rt>Lt Motor  Motor Motor: Hemiplegia Motor - Skilled Clinical Observations: mild R side hemiplegia Mobility    Min A  functional bathroom transfers at ambulatory level using RW Trunk/Postural Assessment  Cervical Assessment Cervical Assessment: Exceptions to WFL(forward head) Thoracic Assessment Thoracic Assessment: Exceptions to WFL(rounded shoulders) Lumbar Assessment Lumbar Assessment: Exceptions to WFL(posterior pelvic tilt) Postural Control Postural Control: Within Functional Limits  Balance Balance Balance Assessed: Yes Dynamic Sitting Balance Dynamic Sitting - Balance Support: No upper extremity supported;During functional activity Dynamic Sitting - Level of Assistance: 5: Stand by assistance(donning Lt gripper sock EOB) Dynamic Standing Balance Dynamic Standing - Balance Support: No upper extremity supported;During functional activity Dynamic Standing - Level of Assistance: 4: Min assist Dynamic Standing - Balance Activities: Lateral lean/weight shifting;Forward lean/weight shifting(perihygiene completion in shower) Extremity/Trunk Assessment RUE Assessment RUE Assessment: Within Functional Limits Active Range of Motion (AROM) Comments: WNL  General Strength Comments: 3+ to 4-/5 grossly LUE Assessment Active Range of Motion (AROM) Comments: WNL General Strength Comments: 3+ to 4-/5 grossly    Refer to Care Plan for Long Term Goals  Recommendations for other services: Therapeutic Recreation  Pet therapy and Outing/community reintegration   Discharge Criteria: Patient will be discharged from OT if patient refuses treatment 3 consecutive times without medical reason, if treatment goals not met, if there is a change in medical status, if patient makes no progress towards goals or if patient is discharged from hospital.  The above assessment, treatment plan, treatment alternatives and goals were discussed and mutually agreed upon: by patient  Skeet Simmer 10/16/2019, 3:22 PM

## 2019-10-16 NOTE — Progress Notes (Addendum)
[place] PHYSICAL MEDICINE & REHABILITATION PROGRESS NOTE   Subjective/Complaints:  Patient is working with speech therapy.  Speech therapy indicates that this may be eval only and no ongoing treatment may be recommended. Patient complains of upset stomach today.  She had constipation and received MiraLAX yesterday and had loose BMs this morning. Review of systems negative for chest pain shortness of breath nausea vomiting  Objective:   No results found. Recent Labs    10/15/19 1636  WBC 8.8  HGB 9.7*  HCT 31.4*  PLT 317   Recent Labs    10/15/19 1636  CREATININE 1.31*    Intake/Output Summary (Last 24 hours) at 10/16/2019 1036 Last data filed at 10/16/2019 0904 Gross per 24 hour  Intake 320 ml  Output -  Net 320 ml     Physical Exam: Vital Signs Blood pressure (!) 148/80, pulse (!) 59, temperature 98.3 F (36.8 C), temperature source Oral, resp. rate 18, SpO2 100 %.   General: No acute distress Mood and affect are appropriate Heart: Regular rate and rhythm no rubs murmurs or extra sounds Lungs: Clear to auscultation, breathing unlabored, no rales or wheezes Abdomen: Positive increased bowel sounds, soft nontender to palpation, nondistended Extremities: No clubbing, cyanosis, or edema Skin: No evidence of breakdown, no evidence of rash Neurologic: Cranial nerves II through XII intact, motor strength is 5/5 in bilateral deltoid, bicep, tricep, grip, 4/5 right and 5/5 left hip flexor, knee extensors, ankle dorsiflexor and plantar flexor Sensory exam normal sensation to light touch and proprioception in bilateral upper and lower extremities Cerebellar exam normal finger to nose to finger as well as heel to shin in bilateral upper and lower extremities Musculoskeletal: Full range of motion in all 4 extremities. No joint swelling    Assessment/Plan: 1. Functional deficits secondary to left frontal ACA infarct which require 3+ hours per day of interdisciplinary  therapy in a comprehensive inpatient rehab setting.  Physiatrist is providing close team supervision and 24 hour management of active medical problems listed below.  Physiatrist and rehab team continue to assess barriers to discharge/monitor patient progress toward functional and medical goals  Care Tool:  Bathing              Bathing assist       Upper Body Dressing/Undressing Upper body dressing        Upper body assist      Lower Body Dressing/Undressing Lower body dressing            Lower body assist       Toileting Toileting    Toileting assist Assist for toileting: Minimal Assistance - Patient > 75%     Transfers Chair/bed transfer  Transfers assist     Chair/bed transfer assist level: Minimal Assistance - Patient > 75%     Locomotion Ambulation   Ambulation assist              Walk 10 feet activity   Assist           Walk 50 feet activity   Assist           Walk 150 feet activity   Assist           Walk 10 feet on uneven surface  activity   Assist           Wheelchair     Assist               Wheelchair 50 feet with 2 turns activity  Assist            Wheelchair 150 feet activity     Assist          Blood pressure (!) 148/80, pulse (!) 59, temperature 98.3 F (36.8 C), temperature source Oral, resp. rate 18, SpO2 100 %.   Medical Problem List and Plan: 1.  Right side weakness secondary to posterior left frontal lobe infarct left ACA territory embolic pattern as well as history of CVA 05/2017 Initial rehab evaluations today, SLP in progress, PT OT pending 2.  Antithrombotics: -DVT/anticoagulation: Lovenox             -antiplatelet therapy: Aspirin 81 mg daily, Plavix 75 mg daily x3 weeks then aspirin alone 3. Pain Management: Tylenol as needed 4. Mood/bipolar disorder/mild mental retardation: Abilify 10 mg daily             -antipsychotic agents: N/A 5.  Neuropsych: This patient is capable of making decisions on her own behalf. 6. Skin/Wound Care: Routine skin checks 7. Fluids/Electrolytes/Nutrition: Routine in and outs with follow-up chemistries 8.  Seizure disorder.  Lamictal increased to 100 mg twice daily.  Follow-up neurology services 9.  Cardiomyopathy with chronic diastolic and systolic congestive heart failure.  Monitor for any signs of fluid overload.  Cardiology follow-up await plan for TEE inpatient versus outpatient. 10.  Permissive hypertension.  Hydralazine 10 mg twice daily, Isordil 10 mg twice daily 11.  Diabetes mellitus with peripheral neuropathy.  Hemoglobin A1c 6.5.  SSI.  Patient on Januvia 50 mg daily prior to admission.  Resume as needed CBG (last 3)  Recent Labs    10/15/19 1732 10/15/19 2140 10/16/19 0617  GLUCAP 105* 152* 114*  Good control 11/7 12.  CKD stage III.  Creatinine 1.24-1.52.  Follow-up chemistries 13.  Morbid obesity.  BMI 49.13.  Dietary follow-up 14.  Hyperlipidemia.  Lipitor 15.OSA.CPAP 16. Insomnia: Mrs. Enneking sleeps at midnight and wakes at 4am. She often sleeps poorly. She is on Ambien 5mg  PRN HS for insomnia. Recommended that she drink chamomile tea at 10pm to help her feel drowsy, with a goal of sleeping at 11pm.  17. Vitamin D Deficiency: Continue Ergocalciferol 50,000 U every Tuesday.  #18.  Constipation patient notes some loose stools this morning received MiraLAX yesterday  LOS: 1 days A FACE TO FACE EVALUATION WAS PERFORMED  Charlett Blake 10/16/2019, 10:36 AM

## 2019-10-16 NOTE — Plan of Care (Signed)
  Problem: Consults Goal: RH STROKE PATIENT EDUCATION Description: See Patient Education module for education specifics  Outcome: Progressing Goal: Diabetes Guidelines if Diabetic/Glucose > 140 Description: If diabetic or lab glucose is > 140 mg/dl - Initiate Diabetes/Hyperglycemia Guidelines & Document Interventions  Outcome: Progressing   Problem: RH SAFETY Goal: RH STG ADHERE TO SAFETY PRECAUTIONS W/ASSISTANCE/DEVICE Description: STG Adhere to Safety Precautions With supervision Assistance/Device. Outcome: Progressing   Problem: RH KNOWLEDGE DEFICIT Goal: RH STG INCREASE KNOWLEDGE OF DIABETES Description: Patient/family verbalized understanding of DM including monitoring, diet, exercise, medications, and follow up care with min assist. Outcome: Progressing Goal: RH STG INCREASE KNOWLEDGE OF HYPERTENSION Description: Patient/family verbalized understanding of HTN including monitoring, diet, exercise, medications, and follow up care with min assist.  Outcome: Progressing Goal: RH STG INCREASE KNOWLEGDE OF HYPERLIPIDEMIA Description: Patient/family verbalized understanding of HLD including monitoring, diet, exercise, medications, and follow up care with min assist.  Outcome: Progressing Goal: RH STG INCREASE KNOWLEDGE OF STROKE PROPHYLAXIS Description: Patient/family verbalized understanding of stroke prophylaxis including monitoring, diet, exercise, medications, and follow up care with min assist.  Outcome: Progressing

## 2019-10-17 ENCOUNTER — Other Ambulatory Visit: Payer: Self-pay | Admitting: Student

## 2019-10-17 ENCOUNTER — Inpatient Hospital Stay (HOSPITAL_COMMUNITY): Payer: Medicare HMO | Admitting: Occupational Therapy

## 2019-10-17 LAB — GLUCOSE, CAPILLARY
Glucose-Capillary: 110 mg/dL — ABNORMAL HIGH (ref 70–99)
Glucose-Capillary: 147 mg/dL — ABNORMAL HIGH (ref 70–99)
Glucose-Capillary: 99 mg/dL (ref 70–99)
Glucose-Capillary: 125 mg/dL — ABNORMAL HIGH (ref 70–99)

## 2019-10-17 NOTE — Plan of Care (Signed)
  Problem: Consults Goal: RH STROKE PATIENT EDUCATION Description: See Patient Education module for education specifics  Outcome: Progressing Goal: Diabetes Guidelines if Diabetic/Glucose > 140 Description: If diabetic or lab glucose is > 140 mg/dl - Initiate Diabetes/Hyperglycemia Guidelines & Document Interventions  Outcome: Progressing   Problem: RH SAFETY Goal: RH STG ADHERE TO SAFETY PRECAUTIONS W/ASSISTANCE/DEVICE Description: STG Adhere to Safety Precautions With supervision Assistance/Device. Outcome: Progressing   Problem: RH KNOWLEDGE DEFICIT Goal: RH STG INCREASE KNOWLEDGE OF DIABETES Description: Patient/family verbalized understanding of DM including monitoring, diet, exercise, medications, and follow up care with min assist. Outcome: Progressing Goal: RH STG INCREASE KNOWLEDGE OF HYPERTENSION Description: Patient/family verbalized understanding of HTN including monitoring, diet, exercise, medications, and follow up care with min assist.  Outcome: Progressing Goal: RH STG INCREASE KNOWLEGDE OF HYPERLIPIDEMIA Description: Patient/family verbalized understanding of HLD including monitoring, diet, exercise, medications, and follow up care with min assist.  Outcome: Progressing Goal: RH STG INCREASE KNOWLEDGE OF STROKE PROPHYLAXIS Description: Patient/family verbalized understanding of stroke prophylaxis including monitoring, diet, exercise, medications, and follow up care with min assist.  Outcome: Progressing

## 2019-10-17 NOTE — Progress Notes (Signed)
[facility] PHYSICAL MEDICINE & REHABILITATION PROGRESS NOTE   Subjective/Complaints:  Bowels are doing okay today. Review of systems negative for chest pain shortness of breath nausea vomiting  Objective:   No results found. Recent Labs    10/15/19 1636  WBC 8.8  HGB 9.7*  HCT 31.4*  PLT 317   Recent Labs    10/15/19 1636  CREATININE 1.31*    Intake/Output Summary (Last 24 hours) at 10/17/2019 1123 Last data filed at 10/17/2019 0824 Gross per 24 hour  Intake 622 ml  Output -  Net 622 ml     Physical Exam: Vital Signs Blood pressure (!) 148/86, pulse 68, temperature 98.5 F (36.9 C), temperature source Oral, resp. rate 20, SpO2 100 %.   General: No acute distress Mood and affect are appropriate Heart: Regular rate and rhythm no rubs murmurs or extra sounds Lungs: Clear to auscultation, breathing unlabored, no rales or wheezes Abdomen: Positive increased bowel sounds, soft nontender to palpation, nondistended Extremities: No clubbing, cyanosis, or edema Skin: No evidence of breakdown, no evidence of rash Neurologic: Cranial nerves II through XII intact, motor strength is 5/5 in bilateral deltoid, bicep, tricep, grip, 4/5 right and 5/5 left hip flexor, knee extensors, ankle dorsiflexor and plantar flexor Sensory exam normal sensation to light touch and proprioception in bilateral upper and lower extremities Cerebellar exam normal finger to nose to finger as well as heel to shin in bilateral upper and lower extremities Musculoskeletal: Full range of motion in all 4 extremities. No joint swelling    Assessment/Plan: 1. Functional deficits secondary to left frontal ACA infarct which require 3+ hours per day of interdisciplinary therapy in a comprehensive inpatient rehab setting.  Physiatrist is providing close team supervision and 24 hour management of active medical problems listed below.  Physiatrist and rehab team continue to assess barriers to discharge/monitor  patient progress toward functional and medical goals  Care Tool:  Bathing    Body parts bathed by patient: Right arm, Left arm, Chest, Abdomen, Front perineal area, Buttocks, Right upper leg, Left upper leg, Face   Body parts bathed by helper: Right lower leg, Left lower leg     Bathing assist Assist Level: Minimal Assistance - Patient > 75%     Upper Body Dressing/Undressing Upper body dressing   What is the patient wearing?: Hospital gown only    Upper body assist Assist Level: Minimal Assistance - Patient > 75%    Lower Body Dressing/Undressing Lower body dressing      What is the patient wearing?: Hospital gown only     Lower body assist Assist for lower body dressing: Minimal Assistance - Patient > 75%     Toileting Toileting    Toileting assist Assist for toileting: Minimal Assistance - Patient > 75%     Transfers Chair/bed transfer  Transfers assist     Chair/bed transfer assist level: Minimal Assistance - Patient > 75%     Locomotion Ambulation   Ambulation assist      Assist level: Minimal Assistance - Patient > 75% Assistive device: No Device Max distance: 50   Walk 10 feet activity   Assist     Assist level: Minimal Assistance - Patient > 75% Assistive device: No Device   Walk 50 feet activity   Assist    Assist level: Minimal Assistance - Patient > 75% Assistive device: No Device    Walk 150 feet activity   Assist Walk 150 feet activity did not occur: Safety/medical concerns  Walk 10 feet on uneven surface  activity   Assist Walk 10 feet on uneven surfaces activity did not occur: Safety/medical concerns         Wheelchair     Assist Will patient use wheelchair at discharge?: No      Wheelchair assist level: Contact Guard/Touching assist Max wheelchair distance: 70    Wheelchair 50 feet with 2 turns activity    Assist        Assist Level: Contact Guard/Touching assist   Wheelchair  150 feet activity     Assist  Wheelchair 150 feet activity did not occur: Safety/medical concerns       Blood pressure (!) 148/86, pulse 68, temperature 98.5 F (36.9 C), temperature source Oral, resp. rate 20, SpO2 100 %.   Medical Problem List and Plan: 1.  Right side weakness secondary to posterior left frontal lobe infarct left ACA territory embolic pattern as well as history of CVA 05/2017 Initial rehab evaluations today, SLP in progress, PT OT notes appreciated 2.  Antithrombotics: -DVT/anticoagulation: Lovenox             -antiplatelet therapy: Aspirin 81 mg daily, Plavix 75 mg daily x3 weeks then aspirin alone 3. Pain Management: Tylenol as needed 4. Mood/bipolar disorder/mild mental retardation: Abilify 10 mg daily             -antipsychotic agents: N/A 5. Neuropsych: This patient is capable of making decisions on her own behalf. 6. Skin/Wound Care: Routine skin checks 7. Fluids/Electrolytes/Nutrition: Routine in and outs with follow-up chemistries 8.  Seizure disorder.  Lamictal increased to 100 mg twice daily.  Follow-up neurology services 9.  Cardiomyopathy with chronic diastolic and systolic congestive heart failure.  Monitor for any signs of fluid overload.  Cardiology follow-up await plan for TEE inpatient versus outpatient. 10.  Permissive hypertension.  Hydralazine 10 mg twice daily, Isordil 10 mg twice daily 11.  Diabetes mellitus with peripheral neuropathy.  Hemoglobin A1c 6.5.  SSI.  Patient on Januvia 50 mg daily prior to admission.  Resume as needed CBG (last 3)  Recent Labs    10/16/19 1643 10/16/19 2101 10/17/19 0604  GLUCAP 108* 123* 110*  Good control 11/8 12.  CKD stage III.  Creatinine 1.24-1.52.  Follow-up chemistries 13.  Morbid obesity.  BMI 49.13.  Dietary follow-up 14.  Hyperlipidemia.  Lipitor 15.OSA.CPAP 16. Insomnia: Mrs. Coble sleeps at midnight and wakes at 4am. She often sleeps poorly. She is on Ambien 5mg  PRN HS for insomnia.  Recommended that she drink chamomile tea at 10pm to help her feel drowsy, with a goal of sleeping at 11pm.  17. Vitamin D Deficiency: Continue Ergocalciferol 50,000 U every Tuesday.  #18.  Constipation patient notes some loose stools this morning received MiraLAX yesterday  LOS: 2 days A FACE TO FACE EVALUATION WAS PERFORMED  Charlett Blake 10/17/2019, 11:23 AM

## 2019-10-17 NOTE — Progress Notes (Signed)
Occupational Therapy Session Note  Patient Details  Name: CARIN ASKELAND MRN: [id number] Date of Birth: 10/14/1971  Today's Date: 10/17/2019 OT Individual Time: [id number] OT Individual Time Calculation (min): 59 min   Short Term Goals: Week 1:  OT Short Term Goal 1 (Week 1): STGs=LTGs due to ELOS  Skilled Therapeutic Interventions/Progress Updates:    Pt greeted in the recliner with no c/o pain. Requesting to shower. She ambulated with the RW and steady assist to toilet first. Pt with continent bladder void and completed her own hygiene while seated. Transitioned to TTB afterwards, vcs for transfer technique using device. Pt was able to wash her feet today! Min guard for dynamic standing balance during perihygiene and also when donning hospital gowns when she had finished bathing. Assistance needed for brief, Teds, and gripper socks. She would benefit from sock aide training. Pt next completed oral care while standing at the sink with supervision for balance. Discussed DME needs for home with pt verbalizing she has a tub shower but no seat. She ambulated with RW and steady assist to the large tub shower room. Practiced TTB transfers with device. Discussed recommendations for nonslip adhesives on shower floor and positioning of curtain to prevent spillage. Due to fatigue, pt was escorted via w/c back to her room. Short distance ambulatory transfer to recliner completed using RW. Pt reported nighttime urinary incontinence PTA due to medicine and inability to make it in time to the toilet. Discussed buying pull ups for nighttime use and recommendation for BSC at home. Pt receptive to education and skilled recommendations for home. Left her with all needs within reach, safety belt fastened, and LEs elevated.      Therapy Documentation Precautions:  Precautions Precautions: Fall Restrictions Weight Bearing Restrictions: No ADL: ADL Eating: Not assessed Grooming: Contact guard Where  Assessed-Grooming: Standing at sink Upper Body Bathing: Supervision/safety Where Assessed-Upper Body Bathing: Shower Lower Body Bathing: Minimal assistance Where Assessed-Lower Body Bathing: Shower Upper Body Dressing: Minimal assistance Where Assessed-Upper Body Dressing: Edge of bed Lower Body Dressing: Maximal assistance Where Assessed-Lower Body Dressing: Edge of bed Toileting: Minimal assistance Where Assessed-Toileting: Glass blower/designer: Psychiatric nurse Method: Ambulating(RW) Science writer: Emergency planning/management officer Transfer: Environmental education officer Method: Ambulating(RW) Youth worker: Radio broadcast assistant            Therapy/Group: Individual Therapy  Jailyne Chieffo A Mellisa Arshad 10/17/2019, 12:47 PM

## 2019-10-18 ENCOUNTER — Other Ambulatory Visit (HOSPITAL_COMMUNITY): Payer: Medicare HMO

## 2019-10-18 ENCOUNTER — Encounter (HOSPITAL_COMMUNITY): Payer: Self-pay

## 2019-10-18 ENCOUNTER — Inpatient Hospital Stay (HOSPITAL_COMMUNITY): Payer: Medicare HMO | Admitting: Occupational Therapy

## 2019-10-18 ENCOUNTER — Ambulatory Visit (HOSPITAL_COMMUNITY): Admit: 2019-10-18 | Payer: Medicare HMO | Admitting: Internal Medicine

## 2019-10-18 ENCOUNTER — Inpatient Hospital Stay (HOSPITAL_COMMUNITY): Payer: Medicare HMO

## 2019-10-18 ENCOUNTER — Telehealth: Payer: Self-pay

## 2019-10-18 DIAGNOSIS — I10 Essential (primary) hypertension: Secondary | ICD-10-CM

## 2019-10-18 LAB — GLUCOSE, CAPILLARY
Glucose-Capillary: 98 mg/dL (ref 70–99)
Glucose-Capillary: 93 mg/dL (ref 70–99)
Glucose-Capillary: 117 mg/dL — ABNORMAL HIGH (ref 70–99)

## 2019-10-18 LAB — CBC WITH DIFFERENTIAL/PLATELET
Abs Immature Granulocytes: 0.05 10*3/uL (ref 0.00–0.07)
Basophils Absolute: 0 10*3/uL (ref 0.0–0.1)
Basophils Relative: 1 %
Eosinophils Absolute: 0.2 10*3/uL (ref 0.0–0.5)
Eosinophils Relative: 3 %
HCT: 30.4 % — ABNORMAL LOW (ref 36.0–46.0)
Hemoglobin: 9.3 g/dL — ABNORMAL LOW (ref 12.0–15.0)
Immature Granulocytes: 1 %
Lymphocytes Relative: 34 %
Lymphs Abs: 1.9 10*3/uL (ref 0.7–4.0)
MCH: 27.9 pg (ref 26.0–34.0)
MCHC: 30.6 g/dL (ref 30.0–36.0)
MCV: 91.3 fL (ref 80.0–100.0)
Monocytes Absolute: 0.4 10*3/uL (ref 0.1–1.0)
Monocytes Relative: 7 %
Neutro Abs: 3.1 10*3/uL (ref 1.7–7.7)
Neutrophils Relative %: 54 %
Platelets: 289 10*3/uL (ref 150–400)
RBC: 3.33 MIL/uL — ABNORMAL LOW (ref 3.87–5.11)
RDW: 14.4 % (ref 11.5–15.5)
WBC: 5.6 10*3/uL (ref 4.0–10.5)
nRBC: 0 % (ref 0.0–0.2)

## 2019-10-18 LAB — COMPREHENSIVE METABOLIC PANEL
ALT: 13 U/L (ref 0–44)
AST: 15 U/L (ref 15–41)
Albumin: 2.9 g/dL — ABNORMAL LOW (ref 3.5–5.0)
Alkaline Phosphatase: 69 U/L (ref 38–126)
Anion gap: 9 (ref 5–15)
BUN: 12 mg/dL (ref 6–20)
CO2: 23 mmol/L (ref 22–32)
Calcium: 8.5 mg/dL — ABNORMAL LOW (ref 8.9–10.3)
Chloride: 104 mmol/L (ref 98–111)
Creatinine, Ser: 1.37 mg/dL — ABNORMAL HIGH (ref 0.44–1.00)
GFR calc Af Amer: 52 mL/min — ABNORMAL LOW (ref >60)
GFR calc non Af Amer: 45 mL/min — ABNORMAL LOW (ref >60)
Glucose, Bld: 129 mg/dL — ABNORMAL HIGH (ref 70–99)
Potassium: 3.9 mmol/L (ref 3.5–5.1)
Sodium: 136 mmol/L (ref 135–145)
Total Bilirubin: 0.3 mg/dL (ref 0.3–1.2)
Total Protein: 6.5 g/dL (ref 6.5–8.1)

## 2019-10-18 SURGERY — ECHOCARDIOGRAM, TRANSESOPHAGEAL
Anesthesia: Monitor Anesthesia Care

## 2019-10-18 MED ORDER — SODIUM CHLORIDE 0.9 % IV SOLN: INTRAVENOUS | Status: DC

## 2019-10-18 NOTE — IPOC Note (Signed)
Overall Plan of Care [facility]) Patient Details Name: Brandi Gomez MRN: [id number] DOB: [birth year]  Admitting Diagnosis: Ischemic cerebrovascular accident (CVA) of frontal lobe [facility])  Hospital Problems: Principal Problem:   Ischemic cerebrovascular accident (CVA) of frontal lobe ([place])     Functional Problem List: Nursing Edema, Endurance, Medication Management, Motor, Safety  PT Balance, Endurance, Motor, Perception, Safety, Sensory  OT Balance, Cognition, Endurance, Motor, Safety  SLP    TR         Basic ADL's: OT Grooming, Bathing, Dressing, Toileting     Advanced  ADL's: OT Simple Meal Preparation     Transfers: PT Bed Mobility, Bed to Chair, Car, Furniture, Floor  OT Toilet, Metallurgist: PT Ambulation, Stairs, Wheelchair Mobility     Additional Impairments: OT None  SLP        TR      Anticipated Outcomes Item Anticipated Outcome  Self Feeding No goal  Swallowing      Basic self-care  Media planner Transfers Supervision  Bowel/Bladder  Mod I assist  Transfers  supervision assist with LRAD  Locomotion  Ambulatory with supervision assist and LRAD  Communication     Cognition     Pain  < 4  Safety/Judgment  Supervision/ mod I assist   Therapy Plan: PT Intensity: Minimum of 1-2 x/day ,45 to 90 minutes PT Frequency: 5 out of 7 days PT Duration Estimated Length of Stay: 6-9 days OT Intensity: Minimum of 1-2 x/day, 45 to 90 minutes OT Frequency: 5 out of 7 days OT Duration/Estimated Length of Stay: 7-10 days     Due to the current state of emergency, patients may not be receiving their 3-hours of Medicare-mandated therapy.   Team Interventions: Nursing Interventions Patient/Family Education, Disease Management/Prevention, Cognitive Remediation/Compensation, Medication Management, Discharge Planning, Psychosocial Support  PT interventions Ambulation/gait training, Training and development officer,  Cognitive remediation/compensation, Discharge planning, Community reintegration, DME/adaptive equipment instruction, Functional mobility training, Disease management/prevention, Patient/family education, Pain management, Neuromuscular re-education, Psychosocial support, Skin care/wound management, Splinting/orthotics, Therapeutic Exercise, Therapeutic Activities, UE/LE Strength taining/ROM, UE/LE Coordination activities, Visual/perceptual remediation/compensation, Stair training, Wheelchair propulsion/positioning  OT Interventions Training and development officer, Engineer, drilling, Patient/family education, Therapeutic Activities, Wheelchair propulsion/positioning, Cognitive remediation/compensation, Functional electrical stimulation, Psychosocial support, Therapeutic Exercise, UE/LE Strength taining/ROM, Self Care/advanced ADL retraining, Functional mobility training, Community reintegration, Discharge planning, Neuromuscular re-education, UE/LE Coordination activities, Pain management, Disease mangement/prevention  SLP Interventions    TR Interventions    SW/CM Interventions Discharge Planning, Psychosocial Support, Patient/Family Education   Barriers to Discharge MD  Medical stability  Nursing      PT Home environment access/layout    OT Medical stability, Weight    SLP      SW       Team Discharge Planning: Destination: PT-Home ,OT- Home , SLP-Home Projected Follow-up: PT-Home health PT, OT-  Home health OT, SLP-None Projected Equipment Needs: PT-Rolling walker with 5" wheels, OT- To be determined, SLP-None recommended by SLP Equipment Details: PT- , OT-  Patient/family involved in discharge planning: PT- Patient,  OT-Patient, SLP-Patient  MD ELOS: 7-9 days Medical Rehab Prognosis:  Excellent Assessment: The patient has been admitted for CIR therapies with the diagnosis of embolic left frontal lobe cerebral infarct. The team will be addressing functional mobility,  strength, stamina, balance, safety, adaptive techniques and equipment, self-care, bowel and bladder mgt, patient and caregiver education, NMR, visual-spatial awareness, community reentry, ego support. Goals have been set at supervision for self-care and basic  mobility.   Due to the current state of emergency, patients may not be receiving their 3 hours per day of Medicare-mandated therapy.    Meredith Staggers, MD, FAAPMR      See Team Conference Notes for weekly updates to the plan of care

## 2019-10-18 NOTE — Progress Notes (Signed)
Physical Therapy Session Note  Patient Details  Name: THANDI MAPHIS MRN: [id number] Date of Birth: Aug 03, 1971  Today's Date: 10/18/2019 PT Individual Time: 0850-0949 PT Individual Time Calculation (min): 59 min   Short Term Goals: Week 1:  PT Short Term Goal 1 (Week 1): STG=LTG due to ELOS  Skilled Therapeutic Interventions/Progress Updates:   Functional gait in room and transfers to toilet with overall CGA without use of AD. Administered Berg Balance assessment for fall risk (see below for details) and results discussed with patient. Engaged in [place] for balance retraining and coordination during dynamic gait task of obstacle negotiation, sidestepping, and toe taps and step over cones x 2 reps each activity with CGA to min assist without AD. Gait training on uneven surfaces to simulate home environment with CGA over ramp. Instructed in strengthening and balance exercises for NMR including standing heel raises with 2 sec hold, standing toe raises with 2 sec hold, mini squats without UE support, and lateral lunges x 10 reps each. Issued handout of HEP for pt to take home. Functional gait training on unit x 150' x 2 with overall CGA with improved step length and cadence noted, does maintain wider BOS, and no true LOB occurred. Pt requesting seated rest break due to LE muscle fatigue. Nustep x 5 on level 5 with BLE only for functional strengthening and cardiovascular endurance training (RPE= 7). End of session transferred back to recliner with CGA.   Therapy Documentation Precautions:  Precautions Precautions: Fall Restrictions Weight Bearing Restrictions: No  Pain: Pain Assessment Pain Scale: 0-10 Pain Score: 0-No pain    Balance: Standardized Balance Assessment Standardized Balance Assessment: Berg Balance Test Berg Balance Test Sit to Stand: Able to stand without using hands and stabilize independently Standing Unsupported: Able to stand 2 minutes with supervision Sitting with Back  Unsupported but Feet Supported on Floor or Stool: Able to sit safely and securely 2 minutes Stand to Sit: Sits safely with minimal use of hands Transfers: Needs one person to assist Standing Unsupported with Eyes Closed: Able to stand 10 seconds with supervision Standing Ubsupported with Feet Together: Able to place feet together independently and stand for 1 minute with supervision From Standing, Reach Forward with Outstretched Arm: Can reach confidently >25 cm (10") From Standing Position, Pick up Object from Floor: Able to pick up shoe, needs supervision From Standing Position, Turn to Look Behind Over each Shoulder: Needs supervision when turning Turn 360 Degrees: Needs close supervision or verbal cueing Standing Unsupported, Alternately Place Feet on Step/Stool: Able to complete >2 steps/needs minimal assist Standing Unsupported, One Foot in Front: Needs help to step but can hold 15 seconds Standing on One Leg: Unable to try or needs assist to prevent fall Total Score: 33   Therapy/Group: Individual Therapy  Canary Brim Ivory Broad, PT, DPT, CBIS  10/18/2019, 10:44 AM

## 2019-10-18 NOTE — Telephone Encounter (Signed)
Brandi Cloud, NP  Anda Latina, RN        Hi Megan, Do you have any appointments in the next few weeks for this patient for hospital follow-up. She was recently admitted for new stroke and SZ. She had an appointment with me in December.    I contacted the pt this am and lvm. Dr. Jannifer Franklin has several openings today (EMG reschedules). Wanted to see if patient would be interested in appointment?

## 2019-10-18 NOTE — Plan of Care (Signed)
  Problem: Consults Goal: RH STROKE PATIENT EDUCATION Description: See Patient Education module for education specifics  Outcome: Progressing Goal: Diabetes Guidelines if Diabetic/Glucose > 140 Description: If diabetic or lab glucose is > 140 mg/dl - Initiate Diabetes/Hyperglycemia Guidelines & Document Interventions  Outcome: Progressing   Problem: RH SAFETY Goal: RH STG ADHERE TO SAFETY PRECAUTIONS W/ASSISTANCE/DEVICE Description: STG Adhere to Safety Precautions With supervision Assistance/Device. Outcome: Progressing   Problem: RH KNOWLEDGE DEFICIT Goal: RH STG INCREASE KNOWLEDGE OF DIABETES Description: Patient/family verbalized understanding of DM including monitoring, diet, exercise, medications, and follow up care with min assist. Outcome: Progressing Goal: RH STG INCREASE KNOWLEDGE OF HYPERTENSION Description: Patient/family verbalized understanding of HTN including monitoring, diet, exercise, medications, and follow up care with min assist.  Outcome: Progressing Goal: RH STG INCREASE KNOWLEGDE OF HYPERLIPIDEMIA Description: Patient/family verbalized understanding of HLD including monitoring, diet, exercise, medications, and follow up care with min assist.  Outcome: Progressing Goal: RH STG INCREASE KNOWLEDGE OF STROKE PROPHYLAXIS Description: Patient/family verbalized understanding of stroke prophylaxis including monitoring, diet, exercise, medications, and follow up care with min assist.  Outcome: Progressing

## 2019-10-18 NOTE — Progress Notes (Signed)
Physical Therapy Session Note  Patient Details  Name: Brandi Gomez MRN: [id number] Date of Birth: February 13, 1971  Today's Date: 10/18/2019 PT Individual Time: 0850-0949 PT Individual Time Calculation (min): 59 min   Short Term Goals: Week 1:  PT Short Term Goal 1 (Week 1): STG=LTG due to ELOS  Skilled Therapeutic Interventions/Progress Updates:   Pt sitting in recliner.  No c/o pain.  Pt doffed non slip socks.  PT donned TEDS; pt donned non slip socks.  In reclined recliner, supine, neuromuscular re-education via demo, multimodal cues : 15 x 1 bil hip internal rotation, bil alternating ankle pumps, bil ankle eversion; 20 x 1 R/L straight leg raises.  Seated in w/c without back wupport, 20 x 1 bil heel raises, bil hip adductor squeezes.  Reciprocal scooting forward/backward in w/c without use of UEs for pelvic dissiciation; min assist for R pelvic movement.  Step/taps of LLE onto 4" high step, bil UE support > LUE support, for R knee stance stability.  Without UE support, pt had immediate LOB R during LLE step/taps.  Gait training with ACE on R knee for pt confidence, x 150' on level tile, CGA.  No LOB or buckling of R knee.  At end of session, pt in recliner with bil LEs elevated and back reclined.  Needs at hand and seat belt alarm set.       Therapy Documentation Precautions:  Precautions Precautions: Fall Restrictions Weight Bearing Restrictions: No      Therapy/Group: Individual Therapy  Kenyon Eshleman 10/18/2019, 10:34 AM

## 2019-10-18 NOTE — Progress Notes (Signed)
Inpatient Rehabilitation  Patient information reviewed and entered into eRehab system by Chadwick Reiswig M. Aithan Farrelly, M.A., CCC/SLP, PPS Coordinator.  Information including medical coding, functional ability and quality indicators will be reviewed and updated through discharge.    

## 2019-10-18 NOTE — Progress Notes (Signed)
Social Work Assessment and Plan   Patient Details  Name: Brandi Gomez MRN: [id number] Date of Birth: 1971-11-14  Today's Date: 10/18/2019  Problem List:  Patient Active Problem List   Diagnosis Date Noted  . Ischemic cerebrovascular accident (CVA) of frontal lobe ([place]) 10/15/2019  . Acute CVA (cerebrovascular accident) ([place]) 10/12/2019  . HLD (hyperlipidemia) 10/12/2019  . CKD (chronic kidney disease) stage 3, GFR 30-59 ml/min 10/12/2019  . Acute ischemic stroke ([place]) 10/12/2019  . Chronic combined systolic and diastolic heart failure ([place])   . Depression 07/14/2019  . Bipolar disorder ([place]) 05/13/2019  . Bipolar 1 disorder ([place]) 05/13/2019  . Adjustment disorder with anxiety   . History of recent stroke 08/06/2017  . OSA (obstructive sleep apnea)   . Cryptogenic stroke ([place]) 05/20/2017  . Controlled type 2 diabetes mellitus without complication, without long-term current use of insulin ([place]) 05/08/2017  . Essential hypertension 07/27/2008  . Seizure disorder ([place]) 02/05/2007   Past Medical History:  Past Medical History:  Diagnosis Date  . Anemia   . Anxiety   . Bipolar 1 disorder ([place])   . Common migraine 05/19/2015  . Depression   . Diabetes mellitus, type II ([place])   . Hypertension   . Irritable bowel syndrome (IBS)   . Mild mental retardation   . Obesity   . Partial complex seizure disorder with intractable epilepsy ([place]) 05/12/2014  . Seizures ([place])    intractable, sz 08/23/17  . Sleep apnea   . Stroke ([place])   . Type II or unspecified type diabetes mellitus without mention of complication, not stated as uncontrolled    Past Surgical History:  Past Surgical History:  Procedure Laterality Date  . COLONOSCOPY     2012-normal , Dr Sharlett Iles  . ESOPHAGOGASTRODUODENOSCOPY     normal-Dr Patterson 2012  . LOOP RECORDER INSERTION N/A 05/30/2017   Procedure: Loop Recorder Insertion;  Surgeon: Constance Haw, MD;  Location: [place] [facility];  Service:  Cardiovascular;  Laterality: N/A;  . LOOP RECORDER REMOVAL N/A 03/04/2018   Procedure: LOOP RECORDER REMOVAL;  Surgeon: Constance Haw, MD;  Location: [facility];  Service: Cardiovascular;  Laterality: N/A;  . MYRINGOTOMY WITH TUBE PLACEMENT    . MYRINGOTOMY WITH TUBE PLACEMENT Right 11/05/2017   Procedure: MYRINGOTOMY WITH TUBE PLACEMENT;  Surgeon: Melissa Montane, MD;  Location: [facility];  Service: ENT;  Laterality: Right;  right T Tube placement  . NASAL SINUS SURGERY    . TEE WITHOUT CARDIOVERSION N/A 05/30/2017   Procedure: TRANSESOPHAGEAL ECHOCARDIOGRAM (TEE);  Surgeon: Acie Fredrickson Wonda Cheng, MD;  Location: [facility] ENDOSCOPY;  Service: Cardiovascular;  Laterality: N/A;   Social History:  reports that she has never smoked. She has never used smokeless tobacco. She reports that she does not drink alcohol or use drugs.  Family / Support Systems Marital Status: Single Patient Roles: Parent, Partner Spouse/Significant Other: friend, David Stall @ [phone] Children: daughter, Wonda Olds @ [phone] Anticipated Caregiver: daughter Denton Ar and friend, [name] Ability/Limitations of Caregiver: able to provide 24/7 Caregiver Availability: 24/7 Family Dynamics: good support  Social History Preferred language: English Religion: Baptist Cultural Background: NA Read: Yes Write: Yes Employment Status: Disabled Public relations account executive Issues: None Guardian/Conservator: None -   Abuse/Neglect Abuse/Neglect Assessment Can Be Completed: Yes Physical Abuse: Denies Verbal Abuse: Denies Sexual Abuse: Denies Exploitation of patient/patient's resources: Denies Self-Neglect: Denies  Emotional Status Pt's affect, behavior and adjustment status: Pt pleasant and able to complete assessment without much difficulty.  Denies any  emotional distress - will monitor. Psychiatric History: None Substance Abuse History: None  Patient / Family Perceptions, Expectations & Goals Pt/Family  understanding of illness & functional limitations: Pt and family with general understanding of her medical issues and current functional limitations/ need for CIR Premorbid pt/family roles/activities: independent Anticipated changes in roles/activities/participation: daughter and friend to assume caregiver support roles. Pt/family expectations/goals: Pt hopeful for short LOS  [employer]: None Premorbid Home Care/DME Agencies: None Transportation available at discharge: yes  Discharge Planning Living Arrangements: Children, Non-relatives/Friends Support Systems: Children, Friends/neighbors Type of Residence: Private residence Administrator, sports: Chartered certified accountant Resources: [employer] Screen Referred: No Living Expenses: Lives with family Money Management: Patient, Family Does the patient have any problems obtaining your medications?: No Home Management: pt and family Patient/Family Preliminary Plans: Pt to d/c home with daughter and friend to provide 24/7 support Social Work Anticipated Follow Up Needs: HH/OP Expected length of stay: 6 days  Clinical Impression Pt here following CVA and making quick gains with short LOS anticipated.  Denies any mood/ emotional distress.  Family prepared to provide 24/7 supervision.  Will follow for support and d/c planning needs.  Lathaniel Legate 10/18/2019, 10:50 AM

## 2019-10-18 NOTE — Progress Notes (Signed)
Occupational Therapy Session Note  Patient Details  Name: Brandi Gomez MRN: [id number] Date of Birth: 1971/04/11  Today's Date: 10/18/2019 OT Individual Time: [id number] OT Individual Time Calculation (min): 70 min    Short Term Goals: Week 1:  OT Short Term Goal 1 (Week 1): STGs=LTGs due to ELOS  Skilled Therapeutic Interventions/Progress Updates:    Treatment session with focus on dynamic standing balance and LB dressing during self-care tasks.  Pt received upright in recliner requesting to complete shower.  Pt ambulated to room shower with CGA.  Completed bathing at sit > stand level with CGA when bending to wash lower legs and to retreive dropped wash cloth from floor.  Educated on use of sock aid for LB dressing with pt able to complete with min cues for technique. Also educated on donning socks seated on EOB with leg propped up on EOB.  Pt able to achieve position and donned socks without assistance.  Educated on donning TEDS and provided with plastic foot piece to decrease friction.  Pt able to don TEDS and reapply socks seated EOB without any assistance.  Ambulated 150' without AD with close supervision.  Engaged in dynamic standing activity with focus on functional use of BUE and standing tolerance.  Pt with no LOB, however reports mild lower back pain after prolonged standing.  Educated on seated rest breaks, weight shifting to alleviate stress on back, energy conservation strategies, and modifications to tasks to allow for increased activity tolerance while not also incurring pain.  Pt appreciative of multiple tips.  Returned to room and left semi-reclined in bed with all needs in reach.  Therapy Documentation Precautions:  Precautions Precautions: Fall Restrictions Weight Bearing Restrictions: No Pain:  Pt with no c/o pain   Therapy/Group: Individual Therapy  Simonne Come 10/18/2019, 3:32 PM

## 2019-10-18 NOTE — Progress Notes (Signed)
[facility] PHYSICAL MEDICINE & REHABILITATION PROGRESS NOTE   Subjective/Complaints:  Moved bowels over weekend. Still needs to empty more? Having gas. Appetite ok.   ROS: Patient denies fever, rash, sore throat, blurred vision, nausea, vomiting, diarrhea, cough, shortness of breath or chest pain, joint or back pain, headache, or mood change.    Objective:   No results found. Recent Labs    10/15/19 1636 10/18/19 0638  WBC 8.8 5.6  HGB 9.7* 9.3*  HCT 31.4* 30.4*  PLT 317 289   Recent Labs    10/15/19 1636 10/18/19 0638  NA  --  136  K  --  3.9  CL  --  104  CO2  --  23  GLUCOSE  --  129*  BUN  --  12  CREATININE 1.31* 1.37*  CALCIUM  --  8.5*    Intake/Output Summary (Last 24 hours) at 10/18/2019 0931 Last data filed at 10/18/2019 0740 Gross per 24 hour  Intake 684 ml  Output -  Net 684 ml     Physical Exam: Vital Signs Blood pressure (!) 143/79, pulse 75, temperature 98.5 F (36.9 C), temperature source Oral, resp. rate 20, SpO2 97 %.   Constitutional: No distress . Vital signs reviewed. obese HEENT: EOMI, oral membranes moist Neck: supple Cardiovascular: RRR without murmur. No JVD    Respiratory: CTA Bilaterally without wheezes or rales. Normal effort    GI: BS +, non-tender, non-distended  Neurologic: Cranial nerves II through XII intact, motor strength is 5/5 in bilateral deltoid, bicep, tricep, grip, 4/5 right and 5/5 left hip flexor, knee extensors, ankle dorsiflexor and plantar flexor---stable MMT Sensory exam normal sensation to light touch and proprioception in bilateral upper and lower extremities Musculoskeletal: normal ROM, no edema    Assessment/Plan: 1. Functional deficits secondary to left frontal ACA infarct which require 3+ hours per day of interdisciplinary therapy in a comprehensive inpatient rehab setting.  Physiatrist is providing close team supervision and 24 hour management of active medical problems listed below.  Physiatrist  and rehab team continue to assess barriers to discharge/monitor patient progress toward functional and medical goals  Care Tool:  Bathing    Body parts bathed by patient: Right arm, Left arm, Chest, Abdomen, Front perineal area, Buttocks, Right upper leg, Left upper leg, Face, Right lower leg, Left lower leg   Body parts bathed by helper: Right lower leg, Left lower leg     Bathing assist Assist Level: Contact Guard/Touching assist     Upper Body Dressing/Undressing Upper body dressing   What is the patient wearing?: Hospital gown only    Upper body assist Assist Level: Minimal Assistance - Patient > 75%    Lower Body Dressing/Undressing Lower body dressing      What is the patient wearing?: Incontinence brief     Lower body assist Assist for lower body dressing: Maximal Assistance - Patient 25 - 49%     Toileting Toileting    Toileting assist Assist for toileting: Contact Guard/Touching assist     Transfers Chair/bed transfer  Transfers assist     Chair/bed transfer assist level: Minimal Assistance - Patient > 75%     Locomotion Ambulation   Ambulation assist      Assist level: Independent with assistive device Assistive device: Walker-standard Max distance: 50   Walk 10 feet activity   Assist     Assist level: Minimal Assistance - Patient > 75% Assistive device: No Device   Walk 50 feet activity  Assist    Assist level: Minimal Assistance - Patient > 75% Assistive device: No Device    Walk 150 feet activity   Assist Walk 150 feet activity did not occur: Safety/medical concerns         Walk 10 feet on uneven surface  activity   Assist Walk 10 feet on uneven surfaces activity did not occur: Safety/medical concerns         Wheelchair     Assist Will patient use wheelchair at discharge?: No      Wheelchair assist level: Contact Guard/Touching assist Max wheelchair distance: 70    Wheelchair 50 feet with 2 turns  activity    Assist        Assist Level: Contact Guard/Touching assist   Wheelchair 150 feet activity     Assist  Wheelchair 150 feet activity did not occur: Safety/medical concerns       Blood pressure (!) 143/79, pulse 75, temperature 98.5 F (36.9 C), temperature source Oral, resp. rate 20, SpO2 97 %.   Medical Problem List and Plan: 1.  Right side weakness secondary to posterior left frontal lobe infarct left ACA territory embolic pattern as well as history of CVA 05/2017  -Continue CIR therapies including PT, OT, and SLP  2.  Antithrombotics: -DVT/anticoagulation: Lovenox             -antiplatelet therapy: Aspirin 81 mg daily, Plavix 75 mg daily x3 weeks then aspirin alone 3. Pain Management: Tylenol as needed 4. Mood/bipolar disorder/mild mental retardation: Abilify 10 mg daily             -antipsychotic agents: N/A 5. Neuropsych: This patient is capable of making decisions on her own behalf. 6. Skin/Wound Care: Routine skin checks 7. Fluids/Electrolytes/Nutrition: encourage PO  -I personally reviewed the patient's labs today.   8.  Seizure disorder.  Lamictal increased to 100 mg twice daily.    9.  Cardiomyopathy with chronic diastolic and systolic congestive heart failure.  Monitor for any signs of fluid overload.  Cardiology follow-up await plan for TEE inpatient versus outpatient. 10.  Permissive hypertension.  Hydralazine 10 mg twice daily, Isordil 10 mg twice daily  -bp in range 11/9 11.  Diabetes mellitus with peripheral neuropathy.  Hemoglobin A1c 6.5.  SSI.  Patient on Januvia 50 mg daily prior to admission.  Resume as needed CBG (last 3)  Recent Labs    10/17/19 1200 10/17/19 1623 10/17/19 2107  GLUCAP 147* 99 125*  Good control 11/9 12.  CKD stage III.  Creatinine 1.24-1.52.  1.37 11/9 13.  Morbid obesity.  BMI 49.13.  Dietary follow-up 14.  Hyperlipidemia.  Lipitor 15.OSA.CPAP 16. Insomnia: Brandi Gomez sleeps at midnight and wakes at 4am. She  often sleeps poorly. She is on Ambien 5mg  PRN HS for insomnia.    -continue to monitor 17. Vitamin D Deficiency: Continue Ergocalciferol 50,000 U every Tuesday.  18.  Constipation patient moved bowels over weekend. Continue miralax  LOS: 3 days A FACE TO FACE EVALUATION WAS PERFORMED  Meredith Staggers 10/18/2019, 9:31 AM

## 2019-10-18 NOTE — Plan of Care (Signed)
  Problem: Consults Goal: RH STROKE PATIENT EDUCATION Description: See Patient Education module for education specifics  Outcome: Progressing Goal: Diabetes Guidelines if Diabetic/Glucose > 140 Description: If diabetic or lab glucose is > 140 mg/dl - Initiate Diabetes/Hyperglycemia Guidelines & Document Interventions  Outcome: Progressing   Problem: RH SAFETY Goal: RH STG ADHERE TO SAFETY PRECAUTIONS W/ASSISTANCE/DEVICE Description: STG Adhere to Safety Precautions With supervision Assistance/Device. Outcome: Progressing Flowsheets (Taken 10/18/2019 1347) STG:Pt will adhere to safety precautions with assistance/device: 4-Minimal assistance   Problem: RH KNOWLEDGE DEFICIT Goal: RH STG INCREASE KNOWLEDGE OF DIABETES Description: Patient/family verbalized understanding of DM including monitoring, diet, exercise, medications, and follow up care with min assist. Outcome: Progressing Goal: RH STG INCREASE KNOWLEDGE OF HYPERTENSION Description: Patient/family verbalized understanding of HTN including monitoring, diet, exercise, medications, and follow up care with min assist.  Outcome: Progressing Goal: RH STG INCREASE KNOWLEGDE OF HYPERLIPIDEMIA Description: Patient/family verbalized understanding of HLD including monitoring, diet, exercise, medications, and follow up care with min assist.  Outcome: Progressing Goal: RH STG INCREASE KNOWLEDGE OF STROKE PROPHYLAXIS Description: Patient/family verbalized understanding of stroke prophylaxis including monitoring, diet, exercise, medications, and follow up care with min assist.  Outcome: Progressing

## 2019-10-19 ENCOUNTER — Inpatient Hospital Stay (HOSPITAL_COMMUNITY): Payer: Medicare HMO | Admitting: Occupational Therapy

## 2019-10-19 ENCOUNTER — Inpatient Hospital Stay (HOSPITAL_COMMUNITY): Payer: Medicare HMO | Admitting: Physical Therapy

## 2019-10-19 LAB — GLUCOSE, CAPILLARY
Glucose-Capillary: 116 mg/dL — ABNORMAL HIGH (ref 70–99)
Glucose-Capillary: 91 mg/dL (ref 70–99)
Glucose-Capillary: 94 mg/dL (ref 70–99)
Glucose-Capillary: 132 mg/dL — ABNORMAL HIGH (ref 70–99)

## 2019-10-19 MED ORDER — TRAZODONE HCL 50 MG PO TABS
100.0000 mg | ORAL_TABLET | Freq: Every day | ORAL | Status: DC
  Administered 2019-10-19: 100 mg via ORAL
  Filled 2019-10-19: qty 2

## 2019-10-19 MED ORDER — TRAZODONE HCL 100 MG PO TABS: 100.0000 mg | ORAL_TABLET | Freq: Every day | ORAL | 0 refills | Status: DC

## 2019-10-19 MED ORDER — TORSEMIDE 10 MG PO TABS: 10.0000 mg | ORAL_TABLET | Freq: Every day | ORAL | 0 refills | Status: DC

## 2019-10-19 MED ORDER — HYDRALAZINE HCL 10 MG PO TABS: 10.0000 mg | ORAL_TABLET | Freq: Two times a day (BID) | ORAL | 1 refills | Status: DC

## 2019-10-19 MED ORDER — VITAMIN D (ERGOCALCIFEROL) 1.25 MG (50000 UNIT) PO CAPS: 50000.0000 [IU] | ORAL_CAPSULE | ORAL | 0 refills | Status: DC

## 2019-10-19 MED ORDER — CLOPIDOGREL BISULFATE 75 MG PO TABS: 75.0000 mg | ORAL_TABLET | Freq: Every day | ORAL | 0 refills | Status: AC

## 2019-10-19 MED ORDER — ISOSORBIDE DINITRATE 10 MG PO TABS: 10.0000 mg | ORAL_TABLET | Freq: Two times a day (BID) | ORAL | 0 refills | Status: DC

## 2019-10-19 MED ORDER — LAMOTRIGINE 100 MG PO TABS: 100.0000 mg | ORAL_TABLET | Freq: Two times a day (BID) | ORAL | 1 refills | Status: DC

## 2019-10-19 MED ORDER — ARIPIPRAZOLE 10 MG PO TABS: 10.0000 mg | ORAL_TABLET | Freq: Every day | ORAL | 0 refills | Status: DC

## 2019-10-19 MED ORDER — ACETAMINOPHEN 325 MG PO TABS: 650.0000 mg | ORAL_TABLET | ORAL | Status: AC | PRN

## 2019-10-19 MED ORDER — ATORVASTATIN CALCIUM 40 MG PO TABS: 40.0000 mg | ORAL_TABLET | Freq: Every day | ORAL | 0 refills | Status: DC

## 2019-10-19 NOTE — Discharge Summary (Signed)
Physician Discharge Summary  Patient ID: Brandi Gomez MRN: [id number] DOB/AGE: 1971-01-28 48 y.o.  Admit date: 10/15/2019 Discharge date: 10/20/2019  Discharge Diagnoses:  Principal Problem:   Ischemic cerebrovascular accident (CVA) of frontal lobe ([place]) DVT prophylaxis Cardiomyopathy with chronic diastolic and systolic congestive heart failure Seizure disorder Hypertension Diabetes mellitus peripheral neuropathy CKD stage III Morbid obesity Hyperlipidemia OSA with CPAP Vitamin D deficiency  Discharged Condition: Stable  Significant Diagnostic Studies: Mr Angio Head Wo Contrast  Result Date: 10/12/2019 CLINICAL DATA:  Stroke, follow-up. EXAM: MRA HEAD WITHOUT CONTRAST TECHNIQUE: Angiographic images of the Circle of Willis were obtained using MRA technique without intravenous contrast. COMPARISON:  Brain MRI 10/12/2019, MRA head 05/21/2017 FINDINGS: The intracranial internal carotid arteries are patent without significant stenosis. The right middle cerebral artery is patent without significant proximal stenosis. The right anterior cerebral artery is patent. Apparent mild focal stenosis within the non dominant right A1 segment (series 252, image 3). The left middle and anterior cerebral arteries are patent without significant proximal stenosis. No intracranial aneurysm is identified. The intracranial vertebral arteries are patent without significant stenosis, as is the basilar artery. The bilateral posterior cerebral arteries are patent without significant proximal stenosis. IMPRESSION: No intracranial large vessel occlusion or proximal high-grade arterial stenosis. Mild focal stenosis within the non dominant A1 right anterior cerebral artery. Electronically Signed   By: Kellie Simmering DO   On: 10/12/2019 12:31   [name]  Result Date: 10/12/2019 CLINICAL DATA:  Initial evaluation for acute right lower extremity weakness, fall. EXAM: MRI HEAD WITHOUT CONTRAST TECHNIQUE:  Multiplanar, multiecho pulse sequences of the brain and surrounding structures were obtained without intravenous contrast. COMPARISON:  Prior head CT from 06/30/2019. FINDINGS: Brain: Examination mildly degraded by motion artifact. Cerebral volume within normal limits for age. Patchy T2/FLAIR hyperintensity within the periventricular and deep white matter both cerebral hemispheres noted, nonspecific, but most like related chronic microvascular ischemic disease, mild in nature. Patchy focus of restricted diffusion measuring approximately 12 mm seen involving the cortical and subcortical aspect of the parasagittal posterior left frontal lobe, left ACA distribution (series 9, images 81, 83). No associated hemorrhage or mass effect. No other evidence for acute or subacute ischemia. Gray-white matter differentiation otherwise maintained. No other areas of remote cortical infarction. No foci of susceptibility artifact to suggest acute or chronic intracranial hemorrhage. No mass lesion, midline shift or mass effect. No hydrocephalus. No extra-axial fluid collection. Pituitary gland suprasellar region normal. Midline structures intact. Vascular: Major intracranial vascular flow voids are maintained. Skull and upper cervical spine: Craniocervical junction normal. Upper cervical spine within normal limits. Diffusely decreased T1 signal intensity seen throughout the visualized bone marrow, nonspecific, but most commonly related to anemia, smoking, or obesity. No discrete osseous lesions. Scalp soft tissues within normal limits. Sinuses/Orbits: Globes and orbital soft tissues within normal limits. Paranasal sinuses are clear. Small bilateral mastoid effusions noted, of doubtful significance. Visualized nasopharynx within normal limits. Inner ear structures grossly normal. Other: None. IMPRESSION: 1. 12 mm acute ischemic nonhemorrhagic posterior left frontal lobe infarct, left ACA distribution. No associated hemorrhage or mass  effect. 2. Underlying mild chronic microvascular ischemic disease. Electronically Signed   By: Jeannine Boga M.D.   On: 10/12/2019 01:13   [name]  Result Date: 10/12/2019 CLINICAL DATA:  Initial evaluation for acute right lower extremity weakness. EXAM: MRI LUMBAR SPINE WITHOUT CONTRAST TECHNIQUE: Multiplanar, multisequence MR imaging of the lumbar spine was performed. No intravenous contrast was administered. COMPARISON:  None. FINDINGS: Segmentation: Standard. Lowest well-formed disc space labeled the L5-S1 level. Alignment: Physiologic with preservation of the normal lumbar lordosis. No listhesis. Vertebrae: Vertebral body height maintained without evidence for acute or chronic fracture. Bone marrow signal intensity diffusely decreased on T1 weighted imaging, nonspecific, but most commonly related to anemia, smoking, or obesity. Few scattered benign hemangiomata noted. No other discrete or worrisome osseous lesions. No abnormal marrow edema. Conus medullaris and cauda equina: Conus extends to the L1-2 level. Conus and cauda equina appear normal. Paraspinal and other soft tissues: Paraspinous soft tissues within normal limits. Visualized visceral structures are unremarkable. Disc levels: L1-2:  Unremarkable. L2-3:  Unremarkable. L3-4: Normal interspace. Mild bilateral facet hypertrophy. No significant stenosis. L4-5: Minimal annular disc bulge with disc desiccation. Moderate bilateral facet hypertrophy. Associated trace joint effusion on the left. No significant spinal stenosis. Mild right L4 foraminal narrowing. L5-S1: Minimal annular disc bulge with disc desiccation. Moderate left greater than right facet hypertrophy. Associated small bilateral joint effusions. Epidural lipomatosis. No significant spinal stenosis. Mild left L5 foraminal narrowing. IMPRESSION: 1. No acute abnormality within the lumbar spine. No significant spinal stenosis or evidence for neural impingement. 2.  Moderate facet arthropathy at L4-5 and L5-S1 with resultant mild right L4 and left L5 foraminal stenosis. 3. Diffusely decreased T1 marrow signal intensity, nonspecific, but most commonly related to anemia, smoking, or obesity. Electronically Signed   By: Jeannine Boga M.D.   On: 10/12/2019 01:19   Vas US Carotid (at [facility] Only)  Result Date: 10/12/2019 Carotid Arterial Duplex Study Indications:       CVA. Risk Factors:      Hypertension, hyperlipidemia, Diabetes, no history of                    smoking. Comparison Study:  Previous 05/21/2017 bilateral 1-39% stenosis. Performing Technologist: Estill Batten Mackin : RVT, RDCS (AE), RDMS  Examination Guidelines: A complete evaluation includes B-mode imaging, spectral Doppler, color Doppler, and power Doppler as needed of all accessible portions of each vessel. Bilateral testing is considered an integral part of a complete examination. Limited examinations for reoccurring indications may be performed as noted.  Right Carotid Findings: +----------+--------+--------+--------+------------------+--------+           PSV cm/sEDV cm/sStenosisPlaque DescriptionComments +----------+--------+--------+--------+------------------+--------+ CCA Prox  103     16                                         +----------+--------+--------+--------+------------------+--------+ CCA Distal71      16                                         +----------+--------+--------+--------+------------------+--------+ ICA Prox  63      19      1-39%                              +----------+--------+--------+--------+------------------+--------+ ICA Distal54      12                                         +----------+--------+--------+--------+------------------+--------+ ECA       97      15                                         +----------+--------+--------+--------+------------------+--------+  +----------+--------+-------+----------------+-------------------+  PSV cm/sEDV cmsDescribe        Arm Pressure (mmHG) +----------+--------+-------+----------------+-------------------+ [id number]            Multiphasic, WNL                    +----------+--------+-------+----------------+-------------------+ +---------+--------+--+--------+--+---------+ VertebralPSV cm/s55EDV cm/s19Antegrade +---------+--------+--+--------+--+---------+  Left Carotid Findings: +----------+--------+--------+--------+------------------+--------+           PSV cm/sEDV cm/sStenosisPlaque DescriptionComments +----------+--------+--------+--------+------------------+--------+ CCA Prox  114     24                                         +----------+--------+--------+--------+------------------+--------+ CCA Distal76      17                                         +----------+--------+--------+--------+------------------+--------+ ICA Prox  60      12      1-39%                              +----------+--------+--------+--------+------------------+--------+ ICA Distal78      21                                         +----------+--------+--------+--------+------------------+--------+ ECA       84      15                                         +----------+--------+--------+--------+------------------+--------+ +----------+--------+--------+------------+-------------------+           PSV cm/sEDV cm/sDescribe    Arm Pressure (mmHG) +----------+--------+--------+------------+-------------------+ Subclavian                Not assessed                    +----------+--------+--------+------------+-------------------+ +---------+--------+--+--------+--+---------+ VertebralPSV cm/s44EDV cm/s15Antegrade +---------+--------+--+--------+--+---------+  Summary: Right Carotid: Velocities in the right ICA are consistent with a 1-39% stenosis. Left Carotid:  Velocities in the left ICA are consistent with a 1-39% stenosis. Vertebrals:  Bilateral vertebral arteries demonstrate antegrade flow. Subclavians: Normal flow hemodynamics were seen in the right subclavian artery.              Left subclavian artery not assessed. *See table(s) above for measurements and observations.  Electronically signed by Harold Barban MD on 10/12/2019 at 5:39:15 PM.    Final     Labs:  Basic Metabolic Panel: Recent Labs  Lab 10/15/19 1636 10/18/19 0638  NA  --  136  K  --  3.9  CL  --  104  CO2  --  23  GLUCOSE  --  129*  BUN  --  12  CREATININE 1.31* 1.37*  CALCIUM  --  8.5*    CBC: Recent Labs  Lab 10/15/19 1636 10/18/19 0638  WBC 8.8 5.6  NEUTROABS  --  3.1  HGB 9.7* 9.3*  HCT 31.4* 30.4*  MCV 90.5 91.3  PLT 317 289    CBG: Recent Labs  Lab 10/18/19 2125 10/19/19 0602 10/19/19 1210 10/19/19 1706 10/19/19 2101  GLUCAP 117* 116* 91 94  132*   Family history.  Mother with diabetes.  Mother with hypertension.  Father bipolar disorder.  Daughter with diabetes and leukemia.  Denies colon cancer or rectal cancer  Brief HPI:   Brandi Gomez is a 48 y.o. right-handed female with history of anxiety, bipolar disorder, diabetes mellitus, sleep apnea, chronic systolic and diastolic congestive heart failure, CKD stage III, morbid obesity with BMI 49.13, hypertension, mild mental retardation, seizure disorder followed by Dr. Jannifer Franklin maintained on Lamictal, prior cryptogenic stroke with loop recorder placement 05/30/2017 showing no atrial fibrillation and removed 03/04/2018 at patient request.  Per chart review lives with daughter.  1 level home with 4-5 steps to entry.  Family assistance as needed.  Independent with ADLs but did not not cook due to some memory deficits.  Presented 10/12/2019 with right leg weakness.  MRI of the brain showed a 12 mm acute ischemic nonhemorrhagic posterior left frontal lobe infarction, left ACA distribution with no hemorrhage or  mass-effect.  MRA with no large vessel occlusion or high-grade stenosis.  Echocardiogram with ejection fraction of 40% without emboli.  Patient with noted breakthrough seizures x2.  EEG showed no seizure activity but frequent bifrontal sharps and patient's Lamictal was increased to 100 mg twice daily.  Neurology consulted maintain on aspirin and Plavix x3 weeks then aspirin alone for CVA prophylaxis.  Subcutaneous Lovenox for DVT prophylaxis.  Patient did have a fall on the evening of 10/14/2019 attempting to get out of bed without injury sustained.  Patient was admitted for a comprehensive inpatient rehab program   Hospital Course: KANW[facility] TRUSCOTT was admitted to rehab 10/15/2019 for inpatient therapies to consist of PT, ST and OT at least three hours five days a week. Past admission physiatrist, therapy team and rehab RN have worked together to provide customized collaborative inpatient rehab.  Pertaining to patient left frontal lobe infarction left PCA territory she would follow neurology services.  She remained on aspirin and Plavix x3 weeks and aspirin alone.  Noted history of bipolar disorder she remained on Abilify emotional support provided she was attending full therapies.  She continued on Lamictal 100 mg twice daily for history of seizure disorder EEG negative she would follow-up outpatient neurology with Dr. Jannifer Franklin.  For long-term cardiomyopathy with chronic diastolic systolic congestive heart failure no signs of fluid overload she would follow-up outpatient cardiology services and remained on low dose demadex.  Blood pressures controlled on hydralazine and Isordil she would follow-up with PCP.  Diabetes mellitus peripheral neuropathy hemoglobin A1c of 6.5 patient had been on Januvia prior to admission and blood sugars controlled 91-117..  CKD stage III creatinine 1.24-1.52 and monitored.  Morbid obesity BMI 49.13 dietary follow-up.  Lipitor ongoing for hyperlipidemia.  Noted history of OSA remained  on CPAP.   Blood pressures were monitored on TID basis and remained controlled  Diabetes has been monitored with ac/hs CBG checks and SSI was use prn for tighter BS control.   Marlana Salvage is continent of bowel and bladder.  Marlana Salvage has made gains during rehab stay and is attending therapies  Marlana Salvage will continue to receive follow up therapies   after discharge  Rehab course: During patient's stay in rehab weekly team conferences were held to monitor patient's progress, set goals and discuss barriers to discharge. At admission, patient required min mod assist for mobility as well as ADLs  Physical exam.  Blood pressure 155/87 pulse 79 temperature 98.3 respirations 20 oxygen saturation 100% room air General.  Alert and oriented HEENT  normocephalic atraumatic EOMs intact no discharge Neck is supple nontender no JVD without thyromegaly Cardiac regular rate rhythm not murmur extra sounds Respiratory.  Clear to auscultation without wheeze or rails Abdomen.  Soft nontender nondistended positive bowel sounds Extremities no clubbing cyanosis or edema +2 pulses Neuro.  Alert fair insight and awareness follows simple commands answers basic questions limited medical historian.  Cranial nerves II through XII intact motor strength 5 out of 5 except 4 out of 5 right lower extremity  /She  has had improvement in activity tolerance, balance, postural control as well as ability to compensate for deficits. Marlana Salvage has had improvement in functional use RUE/LUE  and RLE/LLE as well as improvement in awareness.  Working with energy conservation techniques.  Ambulates extended distances with a rolling walker.  Ambulates around the unit without assistive device and supervision negotiate stairs with handrails.  Practice car transfers SUV height with supervision.  She gather belongings for activities delivered and homemaking family teaching completed was advised the need for supervision for her safety.       Disposition:  Discharge disposition: 01-Home or Self Care      Discharged home   Diet: Diabetic diet  Special Instructions: No driving smoking or alcohol  Aspirin 81 mg daily and Plavix 75 mg daily x3 weeks then aspirin alone.  Celesta Gentile held for now.Follow up with PCP  Follow-up cardiology services outpatient question need for TEE  Medications at discharge. 1.  Tylenol as needed 2.  Abilify 10 mg p.o. daily 3.  Aspirin 81 mg daily 4.  Lipitor 40 mg p.o. daily 5.  Plavix 75 mg p.o. daily 6.  Hydralazine 10 mg p.o. twice daily 7.  Isordil 10 mg p.o. twice daily 8.  Lamictal 100 mg p.o. twice daily 9.  MiraLAX daily hold for loose stools 10.  Trazodone 100 mg p.o. nightly 11.  Vitamin D 50,000 units every Tuesday 12.Demadex 10 mg daily  Discharge Instructions    Ambulatory referral to Neurology   Complete by: As directed    An appointment is requested in approximately 4 weeks left frontal/ACA infarction   Ambulatory referral to Physical Medicine Rehab   Complete by: As directed    Moderate complexity follow-up 1 to 2 weeks left frontal ACA infarction      Follow-up Information    Meredith Staggers, MD Follow up.   Specialty: Physical Medicine and Rehabilitation Why: Office to call for appointment Contact information: [address] [place] [address] [phone]        Skeet Latch, MD Follow up.   Specialty: Cardiology Why: Call for appointment Contact information: [address] [place] [address] [phone]           Signed: Lavon Paganini [place] 10/20/2019, 5:17 AM

## 2019-10-19 NOTE — Progress Notes (Signed)
Occupational Therapy Session Note  Patient Details  Name: Brandi Gomez MRN: [id number] Date of Birth: 07-10-1971  Today's Date: 10/19/2019 OT Individual Time: [id number] OT Individual Time Calculation (min): 65 min    Short Term Goals: Week 1:  OT Short Term Goal 1 (Week 1): STGs=LTGs due to ELOS  Skilled Therapeutic Interventions/Progress Updates:    Pt received in recliner ready for therapy.  ADL Retraining: see ADL documentation below.  S overall with all tasks. No need for AE.  Recommended she dry off completely from bench prior to getting out of the shower/tub.  Pt stated she has a tub bench and discussed the need for a tub bench at home. Discussed the height of her bed and how she uses a step stool.  With discuss with her PT. Pt's friend does all the cooking but pt expressed she gets really bored at home.  Talked about how she can help her friend in the cooking tasks.   Therapeutic Activity/ Exercise:  10 squats from EOB to stand with hands on thighs.  Encouraged pt to do this exercise at home from her couch.   Transfers: S without AD  Balance: worked on ambulation into hallway with pt carrying her wash basin tub with items in it to simulate carrying a laundry basket - simulated placing and removing clothing from dryer using her bottom dresser drawer. Had pt use L hand on dresser top for extra stability as she used R hand to reach for clothing  Pt's PT arrived for her next session.   Therapy Documentation Precautions:  Precautions Precautions: Fall Restrictions Weight Bearing Restrictions: No   Pain: Pain Assessment Pain Scale: 0-10 Pain Score: 0-No pain ADL: ADL Eating: Not assessed Grooming: Contact guard Where Assessed-Grooming: Standing at sink Upper Body Bathing: Supervision/safety Where Assessed-Upper Body Bathing: Shower Lower Body Bathing: Minimal assistance Where Assessed-Lower Body Bathing: Shower Upper Body Dressing: Minimal assistance Where  Assessed-Upper Body Dressing: Edge of bed Lower Body Dressing: Maximal assistance Where Assessed-Lower Body Dressing: Edge of bed Toileting: Minimal assistance Where Assessed-Toileting: Glass blower/designer: Psychiatric nurse Method: Ambulating(RW) Science writer: Emergency planning/management officer Transfer: Environmental education officer Method: Ambulating(RW) Youth worker: Radio broadcast assistant   Therapy/Group: Individual Therapy  [place] 10/19/2019, 10:19 AM

## 2019-10-19 NOTE — Patient Care Conference (Signed)
Inpatient RehabilitationTeam Conference and Plan of Care Update Date: 10/19/2019   Time: 10:30 AM   Patient Name: Brandi Gomez      Medical Record Number: [id number]  Date of Birth: 16-Jul-1971 Sex: Female         Room/Bed: 4W04C/4W04C-01 Payor Info: Payor: [employer] MEDICARE / Plan: [employer] HMO / Product Type: *No Product type* /    Admit Date/Time:  10/15/2019  4:05 PM  Primary Diagnosis:  Ischemic cerebrovascular accident (CVA) of frontal lobe [facility])  Patient Active Problem List   Diagnosis Date Noted  . Ischemic cerebrovascular accident (CVA) of frontal lobe ([place]) 10/15/2019  . Acute CVA (cerebrovascular accident) ([place]) 10/12/2019  . HLD (hyperlipidemia) 10/12/2019  . CKD (chronic kidney disease) stage 3, GFR 30-59 ml/min 10/12/2019  . Acute ischemic stroke ([place]) 10/12/2019  . Chronic combined systolic and diastolic heart failure ([place])   . Depression 07/14/2019  . Bipolar disorder ([place]) 05/13/2019  . Bipolar 1 disorder ([place]) 05/13/2019  . Adjustment disorder with anxiety   . History of recent stroke 08/06/2017  . OSA (obstructive sleep apnea)   . Cryptogenic stroke ([place]) 05/20/2017  . Controlled type 2 diabetes mellitus without complication, without long-term current use of insulin ([place]) 05/08/2017  . Essential hypertension 07/27/2008  . Seizure disorder ([place]) 02/05/2007    Expected Discharge Date: Expected Discharge Date: 10/20/19  Team Members Present: Physician leading conference: Dr. Alger Simons Social Worker Present: Lennart Pall, LCSW Nurse Present: Serena Croissant, LPN Case manager: Karene Fry, RN PT Present: Lavone Nian, PT OT Present: Cherylynn Ridges, OT SLP Present: Weston Anna, SLP PPS Coordinator present : Gunnar Fusi, SLP     Current Status/Progress Goal Weekly Team Focus  Bowel/Bladder   continent of B&B, BM 10/17/2019  continue continence  assist with tolieting prn   Swallow/Nutrition/ Hydration             ADL's   Supervision with  all transfers and ambulation without RW, S with all self care. Pt able to don LB clothing without AE.  Needs tub bench.  Supervision  activity tolerance, self-care retraining, pt/family education, dc planning   Mobility   CGA/min assist overall without AD  supervision overall  gait, balance, endurance, d/c planning   Communication             Safety/Cognition/ Behavioral Observations            Pain   no c/o of pain  remain pain free  assess pain QS and prn   Skin   skin intact  keep skin from breakdown and infection  assess skin QS and prn    Rehab Goals Patient on target to meet rehab goals: Yes *See Care Plan and progress notes for long and short-term goals.     Barriers to Discharge  Current Status/Progress Possible Resolutions Date Resolved   Nursing                  PT  Home environment access/layout                 OT Medical stability;Weight                SLP                SW                Discharge Planning/Teaching Needs:  Home with daughter and friend who can provide 24/7 supervision  NA   Team Discussion: L frontal  lobe infarct, doing well, cardiomyopathy compensated, DM controlled, BP up a little, meds adjusted for sleep, medically stable.  RN - not sleeping well.  OT S/CGA amb no device and self care, S goals.  PT goal level S, R inattention, doing really well.  Dtr to be with patient, declined fam ed.   Revisions to Treatment Plan: SLP has signed off.     Medical Summary Current Status: left frontal infarct, chronic cardiomyopathy, htn, CKD Weekly Focus/Goal: balance volume, control bp  Barriers to Discharge: Medical stability       Continued Need for Acute Rehabilitation Level of Care: The patient requires daily medical management by a physician with specialized training in physical medicine and rehabilitation for the following reasons: Direction of a multidisciplinary physical rehabilitation program to maximize functional independence :  Yes Medical management of patient stability for increased activity during participation in an intensive rehabilitation regime.: Yes Analysis of laboratory values and/or radiology reports with any subsequent need for medication adjustment and/or medical intervention. : Yes   I attest that I was present, lead the team conference, and concur with the assessment and plan of the team.   Retta Diones 10/19/2019, 3:01 PM  Team conference was held via web/ teleconference due to [place] - 19

## 2019-10-19 NOTE — Progress Notes (Signed)
Occupational Therapy Session Note  Patient Details  Name: Brandi Gomez MRN: [id number] Date of Birth: [birth year]  Today's Date: 10/19/2019 OT Individual Time: 1100-1200 OT Individual Time Calculation (min): 60 min   Short Term Goals: Week 1:  OT Short Term Goal 1 (Week 1): STGs=LTGs due to ELOS  Skilled Therapeutic Interventions/Progress Updates:    Pt greeted seated in recliner and agreeable to OT treatment session. Pt reported need to go to the bathroom.  Pt ambulated from recliner to bathroom with supervision. Pt transferred onto commode and voided bladder-3/3 toileting steps completed with supervision. Pt then ambulated to therapy apartment with supervision. Discussed home bathroom set-up and practiced simulated tub bench transfers with supervision. Discussed overall DME needs/recommendations and safety modifications within BADL tasks. Pt ambulated back to day room in similar fashion. OT issued pt home fine motor program and reviewed activities with pt. Worked on R hand fine motor coordination with hand writing task and card game. Pt able to explain the game of "Spades" to OT and shuffle/deal cards well. Pt had questions regarding a RW at home and stated she felt like she was safer with a RW, mostly for when she is out in the community. Will discuss with PT and practice w/ RW this afternoon. Pt ambulated back to room and left seated in recliner with call bell in reach, alarm belt on, and needs met.   Therapy Documentation Precautions:  Precautions Precautions: Fall Restrictions Weight Bearing Restrictions: No Pain: Pain Assessment Pain Scale: 0-10 Pain Score: 0-No pain   Therapy/Group: Individual Therapy  Valma Cava 10/19/2019, 12:24 PM

## 2019-10-19 NOTE — Plan of Care (Signed)
  Problem: Consults Goal: RH STROKE PATIENT EDUCATION Description: See Patient Education module for education specifics  Outcome: Progressing Goal: Diabetes Guidelines if Diabetic/Glucose > 140 Description: If diabetic or lab glucose is > 140 mg/dl - Initiate Diabetes/Hyperglycemia Guidelines & Document Interventions  Outcome: Progressing   Problem: RH SAFETY Goal: RH STG ADHERE TO SAFETY PRECAUTIONS W/ASSISTANCE/DEVICE Description: STG Adhere to Safety Precautions With supervision Assistance/Device. Outcome: Progressing   Problem: RH KNOWLEDGE DEFICIT Goal: RH STG INCREASE KNOWLEDGE OF DIABETES Description: Patient/family verbalized understanding of DM including monitoring, diet, exercise, medications, and follow up care with min assist. Outcome: Progressing Goal: RH STG INCREASE KNOWLEDGE OF HYPERTENSION Description: Patient/family verbalized understanding of HTN including monitoring, diet, exercise, medications, and follow up care with min assist.  Outcome: Progressing Goal: RH STG INCREASE KNOWLEGDE OF HYPERLIPIDEMIA Description: Patient/family verbalized understanding of HLD including monitoring, diet, exercise, medications, and follow up care with min assist.  Outcome: Progressing Goal: RH STG INCREASE KNOWLEDGE OF STROKE PROPHYLAXIS Description: Patient/family verbalized understanding of stroke prophylaxis including monitoring, diet, exercise, medications, and follow up care with min assist.  Outcome: Progressing

## 2019-10-19 NOTE — Progress Notes (Signed)
Physical Therapy Session Note  Patient Details  Name: Brandi Gomez MRN: [id number] Date of Birth: May 03, 1971  Today's Date: 10/19/2019 PT Individual Time: [id number] PT Individual Time Calculation (min): 57 min   Short Term Goals: Week 1:  PT Short Term Goal 1 (Week 1): STG=LTG due to ELOS  Skilled Therapeutic Interventions/Progress Updates:  Pt received in handoff from OT & pt agreeable to tx. Pt asking about need to use RW at home with therapist educating pt on CLOF of supervision without AD, as well as reviewing Berg Balance Test score & current fall risk, & educated pt on need for supervision for mobility at d/c, as well as pt's inability to drive until cleared by MD with pt voicing understanding of all info. Pt ambulates around unit without AD & supervision with 1 instance of R inattention & bumping foot on step in gym. Pt negotiates 12 steps (6") with L ascending rail with step over step to ascend, step to (min cuing to lead with RLE) to descend stairs. After negotiating stairs pt reports that task felt like it got her heart going, HR = 98 bpm & seated rest break provided. In apartment, pt completes transfer from low, compliant couch with supervision. Pt reports she has a significantly elevated bed that she had difficulty getting in/out of prior to admission and she uses a mini step ladder. Discussed options of pt removing box spring to lower bed height or switching beds with her daughter with pt agreeable & planning to ask friend to remove box spring. Pt also reports her friend will pick her up in an elevated SUV so pt practiced car transfer at SUV simulated height with supervision and education to sit and put BLE in/out vs stepping in/out of car. Also discussed pt's slight R inattention during gait & benefits of having caregiver ambulate on pt's R side as well as pt's need to scan environment to prevent running into obstacles. Pt negotiates ramp & mulch without AD & supervision, and  negotiates curb with close supervision and cuing for compensatory pattern. Pt ambulates in hallway with task focusing on locating small plastic animals on R side with pt locating 5/7 during first pass, and remaining 2 during 2nd pass with pt initially missing objects placed higher. Therapist reiterates need for pt to scan R side of environment at all times for increased safety with mobility. Pt utilizes nu-step on level 6 x 8 minutes with all four extremities with task focusing on global strengthening & endurance training, coordination of reciprocal movements, & R NMR with pt reporting 13/20 on Borg RPE scale. At end of session pt left in recliner with chair alarm donned & all needs in reach    Therapy Documentation Precautions:  Precautions Precautions: Fall Restrictions Weight Bearing Restrictions: No  Pain: Pt denies c/o pain during session.   Therapy/Group: Individual Therapy  [name] 10/19/2019, 10:36 AM

## 2019-10-19 NOTE — Progress Notes (Signed)
Occupational Therapy Discharge Summary  Patient Details  Name: Brandi Gomez MRN: [id number] Date of Birth: 26-Mar-1971  Today's Date: 10/19/2019 OT Individual Time: 1415-1500 OT Individual Time Calculation (min): 45 min    Pt greeted sitting in recliner and agreeable to OT treatment session. Pt reported need to go to the bathroom. Had pt practice ambulating with a RW into bathroom and pt followed commands to utilize RW safely when turning to sit on commode. Pt voided bladder and completed 3/3 toileting steps with supervision. Pt then ambulated to dayroom with supervision. Practiced RW use to navigate around orange cones. Practiced weaving in and out and turning to sit on chairs. Pt ambulated back to the room with supervision. OT reviewed wc plan and safety modifications within BADL tasks. Practiced bed mobility with mod I. Pt left seated in recliner with alarm belt on and call bell in reach.   Patient has met 9 of 9 long term goals due to improved activity tolerance, improved balance, postural control, ability to compensate for deficits, functional use of  RIGHT upper and RIGHT lower extremity, improved attention, improved awareness and improved coordination.  Patient to discharge at overall Supervision level.  Patient's care partner is independent to provide the necessary physical and cognitive assistance at discharge.    Reasons goals not met: n/a  Recommendation:  Patient will benefit from ongoing skilled OT services in home health setting to continue to advance functional skills in the area of BADL.  Equipment: RW, 3-in-1 BSC, TTB  Reasons for discharge: treatment goals met and discharge from hospital  Patient/family agrees with progress made and goals achieved: Yes  OT Discharge Precautions/Restrictions  Precautions Precautions: Fall Restrictions Weight Bearing Restrictions: No Pain  denies pain ADL ADL Eating: Independent Grooming: Supervision/safety Where  Assessed-Grooming: Standing at sink Upper Body Bathing: Supervision/safety Where Assessed-Upper Body Bathing: Shower Lower Body Bathing: Supervision/safety Where Assessed-Lower Body Bathing: Shower Upper Body Dressing: Supervision/safety Where Assessed-Upper Body Dressing: Edge of bed Lower Body Dressing: Supervision/safety Where Assessed-Lower Body Dressing: Edge of bed Toileting: Supervision/safety Where Assessed-Toileting: Glass blower/designer: Close supervision Toilet Transfer Method: Counselling psychologist: Energy manager: Close supervision Social research officer, government Method: Heritage manager: Health visitor Praxis: Intact Cognition Overall Cognitive Status: History of cognitive impairments - at baseline Arousal/Alertness: Awake/alert Orientation Level: Oriented X4 Safety/Judgment: Appears intact Sensation Sensation Light Touch: Appears Intact Proprioception: Appears Intact Coordination Gross Motor Movements are Fluid and Coordinated: Yes Fine Motor Movements are Fluid and Coordinated: Yes Finger Nose Finger Test: [facility] Motor  Motor Motor: Abnormal postural alignment and control Motor - Discharge Observations: mild R hemiparesis, generalized deconditioning Mobility  Bed Mobility Bed Mobility: Rolling Right;Rolling Left;Sit to Supine;Supine to Sit Rolling Right: Independent Rolling Left: Independent Supine to Sit: Independent Sit to Supine: Independent Transfers Sit to Stand: Supervision/Verbal cueing  Balance Balance Balance Assessed: Yes Static Sitting Balance Static Sitting - Balance Support: No upper extremity supported Static Sitting - Level of Assistance: 6: Modified independent (Device/Increase time) Dynamic Sitting Balance Dynamic Sitting - Balance Support: No upper extremity supported;During functional activity Dynamic Sitting - Level of Assistance: 7: Independent Static Standing  Balance Static Standing - Balance Support: No upper extremity supported Static Standing - Level of Assistance: 5: Stand by assistance Dynamic Standing Balance Dynamic Standing - Balance Support: No upper extremity supported;During functional activity Dynamic Standing - Level of Assistance: 5: Stand by assistance Extremity/Trunk Assessment RUE Assessment RUE Assessment: Within Functional Limits General Strength Comments: 4/5 grossly-WFL LUE  Assessment LUE Assessment: Within Functional Limits General Strength Comments: 3/5 grossly Darryll Capers   Daneen Schick Labria Wos 10/19/2019, 3:22 PM

## 2019-10-19 NOTE — Progress Notes (Signed)
[place] PHYSICAL MEDICINE & REHABILITATION PROGRESS NOTE   Subjective/Complaints:  Up in bed. Moved very well with OT this morning. Just got out of shower. Having problems sleeping, asked if I could increase sleeping med  ROS: Patient denies fever, rash, sore throat, blurred vision, nausea, vomiting, diarrhea, cough, shortness of breath or chest pain, joint or back pain, headache, or mood change.    Objective:   No results found. Recent Labs    10/18/19 0638  WBC 5.6  HGB 9.3*  HCT 30.4*  PLT 289   Recent Labs    10/18/19 0638  NA 136  K 3.9  CL 104  CO2 23  GLUCOSE 129*  BUN 12  CREATININE 1.37*  CALCIUM 8.5*    Intake/Output Summary (Last 24 hours) at 10/19/2019 0933 Last data filed at 10/19/2019 0735 Gross per 24 hour  Intake 840 ml  Output -  Net 840 ml     Physical Exam: Vital Signs Blood pressure (!) 147/83, pulse 80, temperature 98.5 F (36.9 C), resp. rate 18, SpO2 100 %.   Constitutional: No distress . Vital signs reviewed. obese HEENT: EOMI, oral membranes moist Neck: supple Cardiovascular: RRR without murmur. No JVD    Respiratory: CTA Bilaterally without wheezes or rales. Normal effort    GI: BS +, non-tender, non-distended  Neurologic: Cranial nerves II through XII intact, motor strength is 5/5 in bilateral deltoid, bicep, tricep, grip, 4/5 right and 5/5 left hip flexor, knee extensors, ankle dorsiflexor and plantar flexor---stable exam Sensory exam normal sensation to light touch and proprioception in bilateral upper and lower extremities Musculoskeletal: normal ROM, no edema Skin: intact   Assessment/Plan: 1. Functional deficits secondary to left frontal ACA infarct which require 3+ hours per day of interdisciplinary therapy in a comprehensive inpatient rehab setting.  Physiatrist is providing close team supervision and 24 hour management of active medical problems listed below.  Physiatrist and rehab team continue to assess barriers  to discharge/monitor patient progress toward functional and medical goals  Care Tool:  Bathing    Body parts bathed by patient: Right arm, Left arm, Chest, Abdomen, Front perineal area, Buttocks, Right upper leg, Left upper leg, Face, Right lower leg, Left lower leg   Body parts bathed by helper: Right lower leg, Left lower leg     Bathing assist Assist Level: Supervision/Verbal cueing     Upper Body Dressing/Undressing Upper body dressing   What is the patient wearing?: Hospital gown only    Upper body assist Assist Level: Minimal Assistance - Patient > 75%    Lower Body Dressing/Undressing Lower body dressing      What is the patient wearing?: Underwear/pull up     Lower body assist Assist for lower body dressing: Contact Guard/Touching assist     Toileting Toileting    Toileting assist Assist for toileting: Contact Guard/Touching assist     Transfers Chair/bed transfer  Transfers assist     Chair/bed transfer assist level: Contact Guard/Touching assist     Locomotion Ambulation   Ambulation assist      Assist level: Independent with assistive device Assistive device: Walker-standard Max distance: 50   Walk 10 feet activity   Assist     Assist level: Minimal Assistance - Patient > 75% Assistive device: No Device   Walk 50 feet activity   Assist    Assist level: Minimal Assistance - Patient > 75% Assistive device: No Device    Walk 150 feet activity   Assist Walk 150 feet  activity did not occur: Safety/medical concerns         Walk 10 feet on uneven surface  activity   Assist Walk 10 feet on uneven surfaces activity did not occur: Safety/medical concerns         Wheelchair     Assist Will patient use wheelchair at discharge?: No      Wheelchair assist level: Contact Guard/Touching assist Max wheelchair distance: 70    Wheelchair 50 feet with 2 turns activity    Assist        Assist Level: Contact  Guard/Touching assist   Wheelchair 150 feet activity     Assist  Wheelchair 150 feet activity did not occur: Safety/medical concerns       Blood pressure (!) 147/83, pulse 80, temperature 98.5 F (36.9 C), resp. rate 18, SpO2 100 %.   Medical Problem List and Plan: 1.  Right side weakness secondary to posterior left frontal lobe infarct left ACA territory embolic pattern as well as history of CVA 05/2017  -Continue CIR therapies including PT, OT, and SLP  Team conference today  2.  Antithrombotics: -DVT/anticoagulation: Lovenox             -antiplatelet therapy: Aspirin 81 mg daily, Plavix 75 mg daily x3 weeks then aspirin alone 3. Pain Management: Tylenol as needed 4. Mood/bipolar disorder/mild mental retardation: Abilify 10 mg daily             -antipsychotic agents: N/A 5. Neuropsych: This patient is capable of making decisions on her own behalf. 6. Skin/Wound Care: Routine skin checks 7. Fluids/Electrolytes/Nutrition: encourage PO    8.  Seizure disorder.  Lamictal increased to 100 mg twice daily.    9.  Cardiomyopathy with chronic diastolic and systolic congestive heart failure.  Monitor for any signs of fluid overload.  Cardiology follow-up await plan for TEE inpatient versus outpatient. 10.  Permissive hypertension.  Hydralazine 10 mg twice daily, Isordil 10 mg twice daily  -bp in range 11/10 11.  Diabetes mellitus with peripheral neuropathy.  Hemoglobin A1c 6.5.  SSI.  Patient on Januvia 50 mg daily prior to admission.  Resume as needed CBG (last 3)  Recent Labs    10/18/19 1650 10/18/19 2125 10/19/19 0602  GLUCAP 93 117* 116*  Good control 11/10 12.  CKD stage III.  Creatinine 1.24-1.52.  1.37 11/9 13.  Morbid obesity.  BMI 49.13.  Dietary follow-up 14.  Hyperlipidemia.  Lipitor 15.OSA.CPAP 16. Insomnia: Brandi Gomez sleeps at midnight and wakes at 4am. She often sleeps poorly.   -increase trazodone to 100mg  qhs 17. Vitamin D Deficiency: Continue  Ergocalciferol 50,000 U every Tuesday.  18.  Constipation patient moved bowels over weekend. Continue miralax  LOS: 4 days A FACE TO FACE EVALUATION WAS PERFORMED  Meredith Staggers 10/19/2019, 9:33 AM

## 2019-10-19 NOTE — Progress Notes (Signed)
Physical Therapy Discharge Summary  Patient Details  Name: Brandi Gomez MRN: [id number] Date of Birth: 10-08-1971  Today's Date: 10/19/2019   Patient has met 8 of 8 long term goals due to improved activity tolerance, improved balance, improved postural control, increased strength, ability to compensate for deficits, improved attention, improved awareness and improved coordination.  Patient to discharge at an ambulatory level supervision without AD.     Reasons goals not met: n/a  Recommendation:  Patient will benefit from ongoing skilled PT services in home health setting to continue to advance safe functional mobility, address ongoing impairments in balance, awareness, R attention, R NMR, and minimize fall risk.  Equipment: Pt requesting RW for community use, so provided pt with RW (pt practiced with AD with OT).  Reasons for discharge: treatment goals met and discharge from hospital  Patient/family agrees with progress made and goals achieved: Yes  PT Discharge Precautions/Restrictions Precautions Precautions: Fall Restrictions Weight Bearing Restrictions: No  Vision/Perception  Pt wears glasses for reading only at baseline. Vision not assessed at d/c but pt does not report any adverse symptoms. Pt with some R inattention to environment.  Cognition Overall Cognitive Status: History of cognitive impairments - at baseline Arousal/Alertness: Awake/alert Orientation Level: Oriented X4 Awareness: Impaired  Sensation Sensation Light Touch: Not tested Coordination Gross Motor Movements are Fluid and Coordinated: Yes Fine Motor Movements are Fluid and Coordinated: Yes  Motor  Motor Motor: Abnormal postural alignment and control Motor - Discharge Observations: mild R hemiparesis, generalized deconditioning   Mobility Bed Mobility Bed Mobility: Rolling Right;Rolling Left;Supine to Sit;Sit to Supine Rolling Right: Independent with assistive device Rolling Left:  Independent with assistive device Supine to Sit: Independent with assistive device Sit to Supine: Independent with assistive device Transfers Transfers: Sit to Stand;Stand to Sit Sit to Stand: Supervision/Verbal cueing Stand to Sit: Supervision/Verbal cueing  Locomotion  Gait Ambulation: Yes Gait Assistance: Supervision/Verbal cueing Gait Distance (Feet): 150 Feet Assistive device: None Gait Gait: Yes Gait velocity: decreased Stairs / Additional Locomotion Stairs: Yes Stairs Assistance: Supervision/Verbal cueing Stair Management Technique: One rail Left Number of Stairs: 12 Height of Stairs: 6(inches) Ramp: Supervision/Verbal cueing(ambulatory without AD) Curb: Supervision/Verbal cueing(no AD) Wheelchair Mobility Wheelchair Mobility: No   Trunk/Postural Assessment  Cervical Assessment Cervical Assessment: Exceptions to WFL(forward head) Thoracic Assessment Thoracic Assessment: Exceptions to WFL(rounded shoulders) Lumbar Assessment Lumbar Assessment: Exceptions to WFL(posterior pelvic tilt) Postural Control Postural Control: Deficits on evaluation Righting Reactions: slightly delayed Protective Responses: slightly delayed   Balance Balance Balance Assessed: Yes Static Standing Balance Static Standing - Balance Support: No upper extremity supported Static Standing - Level of Assistance: 5: Stand by assistance Dynamic Standing Balance Dynamic Standing - Balance Support: No upper extremity supported;During functional activity Dynamic Standing - Level of Assistance: 5: Stand by assistance  Berg Balance Test on 10/18/2019 = 33/56   Extremity Assessment  All extremities appear WFL, not formally assessed.   [name] 10/19/2019, 3:51 PM

## 2019-10-20 DIAGNOSIS — I639 Cerebral infarction, unspecified: Secondary | ICD-10-CM | POA: Diagnosis not present

## 2019-10-20 LAB — GLUCOSE, CAPILLARY: Glucose-Capillary: 102 mg/dL — ABNORMAL HIGH (ref 70–99)

## 2019-10-20 NOTE — Progress Notes (Signed)
Social Work Discharge Note   The overall goal for the admission was met for:   Discharge location: Yes  Length of Stay: Yes  Discharge activity level: Yes  Home/community participation: Yes  Services provided included: MD, RD, PT, OT, SLP, RN, Pharmacy and [place]: Medicare  Follow-up services arranged: Home Health: PT, OT via [facility], DME: walker, 3n1 and tub bench via [place] and Patient/Family has no preference for HH/DME agencies  Comments (or additional information):  Patient/Family verbalized understanding of follow-up arrangements: Yes  Individual responsible for coordination of the follow-up plan: pt  Confirmed correct DME delivered: Marshel Golubski 10/20/2019    Joakim Huesman

## 2019-10-20 NOTE — Telephone Encounter (Signed)
Returned call for RN,Megan none of patients contact numbers are in service, reached out to patients daughter on Emergency Contact and advised her to ask patient to call office to discuss new appointment date and time.

## 2019-10-20 NOTE — Telephone Encounter (Signed)
Noted  

## 2019-10-20 NOTE — Telephone Encounter (Signed)
Left second vm asking for pt to call me back.

## 2019-10-20 NOTE — Progress Notes (Signed)
Patient discharged home accompanied by friend, [name]. Discharge instructions provided by Helyn Numbers., PA. Patient escorted out via wheelchair with belongings.           Serena Croissant, LPN

## 2019-10-20 NOTE — Telephone Encounter (Signed)
Pt returning call please call back °

## 2019-10-20 NOTE — Progress Notes (Signed)
[place] PHYSICAL MEDICINE & REHABILITATION PROGRESS NOTE   Subjective/Complaints:  She is sitting up in her chair. In very good spirits and eager to go home. Denies pain, constipation--states that she finally moved her bowels. She states that in therapy she has been working on balance exercises, safety measures, and learning to walk with her walker.  VS stable this morning.   ROS: Patient denies fever, rash, sore throat, blurred vision, nausea, vomiting, diarrhea, cough, shortness of breath or chest pain, joint or back pain, headache, or mood change.    Objective:   No results found. Recent Labs    10/18/19 0638  WBC 5.6  HGB 9.3*  HCT 30.4*  PLT 289   Recent Labs    10/18/19 0638  NA 136  K 3.9  CL 104  CO2 23  GLUCOSE 129*  BUN 12  CREATININE 1.37*  CALCIUM 8.5*    Intake/Output Summary (Last 24 hours) at 10/20/2019 0855 Last data filed at 10/19/2019 1810 Gross per 24 hour  Intake 600 ml  Output -  Net 600 ml     Physical Exam: Vital Signs Blood pressure 133/68, pulse 82, temperature 99.3 F (37.4 C), temperature source Oral, resp. rate 15, weight 118 kg, SpO2 95 %.   Constitutional: No distress . Vital signs reviewed. Obese, sitting up in bed. HEENT: EOMI, oral membranes moist Neck: supple Cardiovascular: RRR without murmur. No JVD    Respiratory: CTA Bilaterally without wheezes or rales. Normal effort    GI: BS +, non-tender, non-distended  Neurologic: Cranial nerves II through XII intact, motor strength is 5/5 in bilateral deltoid, bicep, tricep, grip, 4/5 right and 5/5 left hip flexor, knee extensors, ankle dorsiflexor and plantar flexor---stable exam Sensory exam normal sensation to light touch and proprioception in bilateral upper and lower extremities Musculoskeletal: normal ROM, no edema Skin: intact Psychiatric: cheerful, pleasant.    Assessment/Plan: 1. Functional deficits secondary to left frontal ACA infarct which require 3+ hours per  day of interdisciplinary therapy in a comprehensive inpatient rehab setting.  Physiatrist is providing close team supervision and 24 hour management of active medical problems listed below.  Physiatrist and rehab team continue to assess barriers to discharge/monitor patient progress toward functional and medical goals  Care Tool:  Bathing    Body parts bathed by patient: Right arm, Left arm, Chest, Abdomen, Front perineal area, Buttocks, Right upper leg, Left upper leg, Face, Right lower leg, Left lower leg   Body parts bathed by helper: Right lower leg, Left lower leg     Bathing assist Assist Level: Supervision/Verbal cueing     Upper Body Dressing/Undressing Upper body dressing   What is the patient wearing?: Bra, Pull over shirt    Upper body assist Assist Level: Supervision/Verbal cueing    Lower Body Dressing/Undressing Lower body dressing      What is the patient wearing?: Underwear/pull up, Pants     Lower body assist Assist for lower body dressing: Supervision/Verbal cueing     Toileting Toileting    Toileting assist Assist for toileting: Supervision/Verbal cueing     Transfers Chair/bed transfer  Transfers assist     Chair/bed transfer assist level: Supervision/Verbal cueing     Locomotion Ambulation   Ambulation assist      Assist level: Supervision/Verbal cueing Assistive device: No Device Max distance: 150 ft   Walk 10 feet activity   Assist     Assist level: Supervision/Verbal cueing Assistive device: No Device   Walk 50 feet activity  Assist    Assist level: Supervision/Verbal cueing Assistive device: No Device    Walk 150 feet activity   Assist Walk 150 feet activity did not occur: Safety/medical concerns  Assist level: Supervision/Verbal cueing Assistive device: No Device    Walk 10 feet on uneven surface  activity   Assist Walk 10 feet on uneven surfaces activity did not occur: Safety/medical  concerns   Assist level: Supervision/Verbal cueing Assistive device: (none)   Wheelchair     Assist Will patient use wheelchair at discharge?: No(pt ambulatory)   Wheelchair activity did not occur: N/A  Wheelchair assist level: Contact Guard/Touching assist Max wheelchair distance: 70    Wheelchair 50 feet with 2 turns activity    Assist    Wheelchair 50 feet with 2 turns activity did not occur: N/A   Assist Level: Contact Guard/Touching assist   Wheelchair 150 feet activity     Assist  Wheelchair 150 feet activity did not occur: N/A       Blood pressure 133/68, pulse 82, temperature 99.3 F (37.4 C), temperature source Oral, resp. rate 15, weight 118 kg, SpO2 95 %.   Medical Problem List and Plan: 1.  Right side weakness secondary to posterior left frontal lobe infarct left ACA territory embolic pattern as well as history of CVA 05/2017  -Continue CIR therapies including PT, OT, and SLP 2.  Antithrombotics: -DVT/anticoagulation: Lovenox             -antiplatelet therapy: Aspirin 81 mg daily, Plavix 75 mg daily x3 weeks then aspirin alone 3. Pain Management: Tylenol as needed 4. Mood/bipolar disorder/mild mental retardation: Abilify 10 mg daily             -antipsychotic agents: N/A 5. Neuropsych: This patient is capable of making decisions on her own behalf. 6. Skin/Wound Care: Routine skin checks 7. Fluids/Electrolytes/Nutrition: encourage PO 8.  Seizure disorder.  Lamictal increased to 100 mg twice daily.    9.  Cardiomyopathy with chronic diastolic and systolic congestive heart failure.  Monitor for any signs of fluid overload.  Cardiology follow-up await plan for TEE inpatient versus outpatient. 10.  Permissive hypertension.  Hydralazine 10 mg twice daily, Isordil 10 mg twice daily  -bp in range 11/10 11.  Diabetes mellitus with peripheral neuropathy.  Hemoglobin A1c 6.5.  SSI.  Patient on Januvia 50 mg daily prior to admission.  Resume as needed CBG  (last 3)  Recent Labs    10/19/19 1706 10/19/19 2101 10/20/19 0613  GLUCAP 94 132* 102*  Good control 11/10 and 11/11 12.  CKD stage III.  Creatinine 1.24-1.52.  1.37 11/9 13.  Morbid obesity.  BMI 49.13.  Dietary follow-up 14.  Hyperlipidemia.  Lipitor 15.OSA.CPAP 16. Insomnia: Mrs. Bedore sleeps at midnight and wakes at 4am. She often sleeps poorly.   -increase trazodone to 100mg  qhs 17. Vitamin D Deficiency: Continue Ergocalciferol 50,000 U every Tuesday.  18.  Constipation patient moved bowels over weekend. Continue miralax  LOS: 5 days A FACE TO FACE EVALUATION WAS PERFORMED  Clide Deutscher Paulkar 10/20/2019, 8:55 AM

## 2019-10-20 NOTE — Plan of Care (Signed)
  Problem: Consults Goal: RH STROKE PATIENT EDUCATION Description: See Patient Education module for education specifics  Outcome: Completed/Met Goal: Diabetes Guidelines if Diabetic/Glucose > 140 Description: If diabetic or lab glucose is > 140 mg/dl - Initiate Diabetes/Hyperglycemia Guidelines & Document Interventions  Outcome: Completed/Met   Problem: RH SAFETY Goal: RH STG ADHERE TO SAFETY PRECAUTIONS W/ASSISTANCE/DEVICE Description: STG Adhere to Safety Precautions With supervision Assistance/Device. Outcome: Completed/Met   Problem: RH KNOWLEDGE DEFICIT Goal: RH STG INCREASE KNOWLEDGE OF DIABETES Description: Patient/family verbalized understanding of DM including monitoring, diet, exercise, medications, and follow up care with min assist. Outcome: Completed/Met Goal: RH STG INCREASE KNOWLEDGE OF HYPERTENSION Description: Patient/family verbalized understanding of HTN including monitoring, diet, exercise, medications, and follow up care with min assist.  Outcome: Completed/Met Goal: RH STG INCREASE KNOWLEGDE OF HYPERLIPIDEMIA Description: Patient/family verbalized understanding of HLD including monitoring, diet, exercise, medications, and follow up care with min assist.  Outcome: Completed/Met Goal: RH STG INCREASE KNOWLEDGE OF STROKE PROPHYLAXIS Description: Patient/family verbalized understanding of stroke prophylaxis including monitoring, diet, exercise, medications, and follow up care with min assist.  Outcome: Completed/Met

## 2019-10-20 NOTE — Discharge Instructions (Signed)
Inpatient Rehab Discharge Instructions  Brandi Gomez Discharge date and time: No discharge date for patient encounter.   Activities/Precautions/ Functional Status: Activity: activity as tolerated Diet: diabetic diet Wound Care: none needed Functional status:  ___ No restrictions     ___ Walk up steps independently ___ 24/7 supervision/assistance   ___ Walk up steps with assistance ___ Intermittent supervision/assistance  ___ Bathe/dress independently ___ Walk with walker     __x_ Bathe/dress with assistance ___ Walk Independently    ___ Shower independently ___ Walk with assistance    ___ Shower with assistance ___ No alcohol     ___ Return to work/school ________     COMMUNITY REFERRALS UPON DISCHARGE:    Home Health:   PT     OT                       Agency:  [facility] Phone:  [phone]   Medical Equipment/Items Ordered:  Gilford Rile, commode and tub bench                                                       Agency/Supplier:  [facility] @ [phone]     Special Instructions:  No driving smoking or alcohol  Follow-up outpatient cardiology services question need for TEE  Aspirin 81 mg daily and Plavix 75 mg daily x3 weeks then aspirin aloneInpatient Rehab Discharge Instructions    Brandi Gomez Discharge date and time: No discharge date for patient encounter.   Activities/Precautions/ Functional Status: Activity: activity as tolerated Diet:  STROKE/TIA DISCHARGE INSTRUCTIONS SMOKING Cigarette smoking nearly doubles your risk of having a stroke & is the single most alterable risk factor  If you smoke or have smoked in the last 12 months, you are advised to quit smoking for your health.  Most of the excess cardiovascular risk related to smoking disappears within a year of stopping.  Ask you doctor about anti-smoking medications  [place] Quit Line: 1-800-QUIT NOW  Free Smoking Cessation Classes [phone]  CHOLESTEROL Know your levels;  limit fat & cholesterol in your diet  Lipid Panel     Component Value Date/Time   CHOL 123 10/12/2019 0319   TRIG 49 10/12/2019 0319   HDL 49 10/12/2019 0319   CHOLHDL 2.5 10/12/2019 0319   VLDL 10 10/12/2019 0319   LDLCALC 64 10/12/2019 0319      Many patients benefit from treatment even if their cholesterol is at goal.  Goal: Total Cholesterol (CHOL) less than 160  Goal:  Triglycerides (TRIG) less than 150  Goal:  HDL greater than 40  Goal:  LDL (LDLCALC) less than 100   BLOOD PRESSURE American Stroke Association blood pressure target is less that 120/80 mm/Hg  Your discharge blood pressure is:  BP: (!) 143/79  Monitor your blood pressure  Limit your salt and alcohol intake  Many individuals will require more than one medication for high blood pressure  DIABETES (A1c is a blood sugar average for last 3 months) Goal HGBA1c is under 7% (HBGA1c is blood sugar average for last 3 months)  Diabetes:    Lab Results  Component Value Date   HGBA1C 6.5 (H) 10/12/2019     Your HGBA1c can be lowered with medications, healthy diet, and exercise.  Check your blood sugar as directed  by your physician  Call your physician if you experience unexplained or low blood sugars.  PHYSICAL ACTIVITY/REHABILITATION Goal is 30 minutes at least 4 days per week  Activity: Increase activity slowly, Therapies: Physical Therapy: Home Health Return to work:   Activity decreases your risk of heart attack and stroke and makes your heart stronger.  It helps control your weight and blood pressure; helps you relax and can improve your mood.  Participate in a regular exercise program.  Talk with your doctor about the best form of exercise for you (dancing, walking, swimming, cycling).  DIET/WEIGHT Goal is to maintain a healthy weight  Your discharge diet is:  Diet Order            Diet Carb Modified Fluid consistency: Thin; Room service appropriate? Yes  Diet effective now               liquids Your height is:    Your current weight is:   Your Body Mass Index (BMI) is:     Following the type of diet specifically designed for you will help prevent another stroke.  Your goal weight range is:    Your goal Body Mass Index (BMI) is 19-24.  Healthy food habits can help reduce 3 risk factors for stroke:  High cholesterol, hypertension, and excess weight.  RESOURCES Stroke/Support Group:  Call [phone]   STROKE EDUCATION PROVIDED/REVIEWED AND GIVEN TO PATIENT Stroke warning signs and symptoms How to activate emergency medical system (call 911). Medications prescribed at discharge. Need for follow-up after discharge. Personal risk factors for stroke. Pneumonia vaccine given:  Flu vaccine given:  My questions have been answered, the writing is legible, and I understand these instructions.  I will adhere to these goals & educational materials that have been provided to me after my discharge from the hospital.    Wound Care: none needed Functional status:  ___ No restrictions     ___ Walk up steps independently ___ 24/7 supervision/assistance   ___ Walk up steps with assistance ___ Intermittent supervision/assistance  ___ Bathe/dress independently ___ Walk with walker     ___ Bathe/dress with assistance ___ Walk Independently    ___ Shower independently ___ Walk with assistance    ___ Shower with assistance ___ No alcohol     ___ Return to work/school ________  Special Instructions:    My questions have been answered and I understand these instructions. I will adhere to these goals and the provided educational materials after my discharge from the hospital.  Patient/Caregiver Signature _______________________________ Date __________  Clinician Signature _______________________________________ Date __________  Please bring this form and your medication list with you to all your follow-up doctor's appointments.   My questions have been answered and I understand  these instructions. I will adhere to these goals and the provided educational materials after my discharge from the hospital.  Patient/Caregiver Signature _______________________________ Date __________  Clinician Signature _______________________________________ Date __________  Please bring this form and your medication list with you to all your follow-up doctor's appointments.

## 2019-10-21 ENCOUNTER — Other Ambulatory Visit: Payer: Self-pay

## 2019-10-21 ENCOUNTER — Telehealth: Payer: Self-pay | Admitting: Registered Nurse

## 2019-10-21 DIAGNOSIS — E1122 Type 2 diabetes mellitus with diabetic chronic kidney disease: Secondary | ICD-10-CM | POA: Diagnosis not present

## 2019-10-21 DIAGNOSIS — I69398 Other sequelae of cerebral infarction: Secondary | ICD-10-CM | POA: Diagnosis not present

## 2019-10-21 DIAGNOSIS — I130 Hypertensive heart and chronic kidney disease with heart failure and stage 1 through stage 4 chronic kidney disease, or unspecified chronic kidney disease: Secondary | ICD-10-CM | POA: Diagnosis not present

## 2019-10-21 DIAGNOSIS — R29898 Other symptoms and signs involving the musculoskeletal system: Secondary | ICD-10-CM | POA: Diagnosis not present

## 2019-10-21 DIAGNOSIS — N183 Chronic kidney disease, stage 3 unspecified: Secondary | ICD-10-CM | POA: Diagnosis not present

## 2019-10-21 DIAGNOSIS — G4733 Obstructive sleep apnea (adult) (pediatric): Secondary | ICD-10-CM | POA: Diagnosis not present

## 2019-10-21 DIAGNOSIS — E1142 Type 2 diabetes mellitus with diabetic polyneuropathy: Secondary | ICD-10-CM | POA: Diagnosis not present

## 2019-10-21 DIAGNOSIS — F319 Bipolar disorder, unspecified: Secondary | ICD-10-CM | POA: Diagnosis not present

## 2019-10-21 DIAGNOSIS — I5042 Chronic combined systolic (congestive) and diastolic (congestive) heart failure: Secondary | ICD-10-CM | POA: Diagnosis not present

## 2019-10-21 NOTE — Patient Outreach (Signed)
[place] [facility]) Care Management  10/21/2019  Brandi Gomez 1971/10/09 [id number]   Telephone call to patient for follow up after hospital and inpatient rehab stay.  No answer.  HIPAA compliant voice message left.    Plan: RN CM will attempt patient again within the next 4 business days and send letter.  [name], RN, MSN [place] Management Care Management Coordinator Direct Line [phone] Cell [phone] Toll Free: [phone]  Fax: [phone]

## 2019-10-21 NOTE — Telephone Encounter (Signed)
Transitional Care call  Patient name: Brandi Gomez  DOB: [birth year] 1. Are you/is patient experiencing any problems since coming home? No a. Are there any questions regarding any aspect of care? No 2. Are there any questions regarding medications administration/dosing? No a. Are meds being taken as prescribed? Yes b. "Patient should review meds with caller to confirm" Medication List Revieds and Therapist from [place] reviewed with [name] as well.  3. Have there been any falls? No 4. Has Home Health been to the house and/or have they contacted you? Yes, [facility] a. If not, have you tried to contact them? NA b. Can we help you contact them? NA 5. Are bowels and bladder emptying properly? Yes a. Are there any unexpected incontinence issues? No b. If applicable, is patient following bowel/bladder programs? NA 6. Any fevers, problems with breathing, unexpected pain? No 7. Are there any skin problems or new areas of breakdown? No 8. Has the patient/family member arranged specialty MD follow up (ie cardiology/neurology/renal/surgical/etc.)?  HFU appointments have been scheduled, she verbalizes understanding.  a. Can we help arrange? NA 9. Does the patient need any other services or support that we can help arrange? No 10. Are caregivers following through as expected in assisting the patient? Yes 11. Has the patient quit smoking, drinking alcohol, or using drugs as recommended? (                        )  Appointment date/time 10/29/2019  arrival time 10:40 for 11:00 appointment with Dr. Ranell Patrick. At [facility]

## 2019-10-22 ENCOUNTER — Ambulatory Visit: Payer: Medicare HMO

## 2019-10-22 DIAGNOSIS — I69398 Other sequelae of cerebral infarction: Secondary | ICD-10-CM | POA: Diagnosis not present

## 2019-10-22 DIAGNOSIS — R29898 Other symptoms and signs involving the musculoskeletal system: Secondary | ICD-10-CM | POA: Diagnosis not present

## 2019-10-22 DIAGNOSIS — I5042 Chronic combined systolic (congestive) and diastolic (congestive) heart failure: Secondary | ICD-10-CM | POA: Diagnosis not present

## 2019-10-22 DIAGNOSIS — G4733 Obstructive sleep apnea (adult) (pediatric): Secondary | ICD-10-CM | POA: Diagnosis not present

## 2019-10-22 DIAGNOSIS — N183 Chronic kidney disease, stage 3 unspecified: Secondary | ICD-10-CM | POA: Diagnosis not present

## 2019-10-22 DIAGNOSIS — E1142 Type 2 diabetes mellitus with diabetic polyneuropathy: Secondary | ICD-10-CM | POA: Diagnosis not present

## 2019-10-22 DIAGNOSIS — F319 Bipolar disorder, unspecified: Secondary | ICD-10-CM | POA: Diagnosis not present

## 2019-10-22 DIAGNOSIS — E1122 Type 2 diabetes mellitus with diabetic chronic kidney disease: Secondary | ICD-10-CM | POA: Diagnosis not present

## 2019-10-22 DIAGNOSIS — I130 Hypertensive heart and chronic kidney disease with heart failure and stage 1 through stage 4 chronic kidney disease, or unspecified chronic kidney disease: Secondary | ICD-10-CM | POA: Diagnosis not present

## 2019-10-26 ENCOUNTER — Other Ambulatory Visit: Payer: Self-pay

## 2019-10-26 NOTE — Patient Outreach (Signed)
[place] [facility]) Care Management  10/26/2019  NAOMA DIGIROLAMO 09-28-1971 [id number]   Telephone call to patient for follow up after hospital and inpatient rehab stay.  No answer.  HIPAA compliant voice message left.    Plan: RN CM will attempt patient again within the next 4 business days.  [name], RN, MSN [place] Management Care Management Coordinator Direct Line [phone] Cell [phone] Toll Free: [phone]  Fax: [phone]

## 2019-10-27 ENCOUNTER — Other Ambulatory Visit: Payer: Self-pay

## 2019-10-27 NOTE — Patient Outreach (Signed)
[facility]) Care Management  10/27/2019  LATORRIA MURALLES [birth year] [id number]   Telephone call to patient for follow up after hospital and inpatient rehab stay. No answer. HIPAA compliant voice message left.   Plan: RN CM will attempt patient again in the next month.    [name], RN, MSN [place] Management Care Management Coordinator Direct Line [phone] Cell [phone] Toll Free: [phone]  Fax: [phone]

## 2019-10-28 DIAGNOSIS — I130 Hypertensive heart and chronic kidney disease with heart failure and stage 1 through stage 4 chronic kidney disease, or unspecified chronic kidney disease: Secondary | ICD-10-CM | POA: Diagnosis not present

## 2019-10-28 DIAGNOSIS — F319 Bipolar disorder, unspecified: Secondary | ICD-10-CM | POA: Diagnosis not present

## 2019-10-28 DIAGNOSIS — R29898 Other symptoms and signs involving the musculoskeletal system: Secondary | ICD-10-CM | POA: Diagnosis not present

## 2019-10-28 DIAGNOSIS — G4733 Obstructive sleep apnea (adult) (pediatric): Secondary | ICD-10-CM | POA: Diagnosis not present

## 2019-10-28 DIAGNOSIS — I69398 Other sequelae of cerebral infarction: Secondary | ICD-10-CM | POA: Diagnosis not present

## 2019-10-28 DIAGNOSIS — E1122 Type 2 diabetes mellitus with diabetic chronic kidney disease: Secondary | ICD-10-CM | POA: Diagnosis not present

## 2019-10-28 DIAGNOSIS — N183 Chronic kidney disease, stage 3 unspecified: Secondary | ICD-10-CM | POA: Diagnosis not present

## 2019-10-28 DIAGNOSIS — E1142 Type 2 diabetes mellitus with diabetic polyneuropathy: Secondary | ICD-10-CM | POA: Diagnosis not present

## 2019-10-28 DIAGNOSIS — I5042 Chronic combined systolic (congestive) and diastolic (congestive) heart failure: Secondary | ICD-10-CM | POA: Diagnosis not present

## 2019-10-29 ENCOUNTER — Other Ambulatory Visit: Payer: Self-pay

## 2019-10-29 ENCOUNTER — Encounter: Payer: Self-pay | Admitting: Physical Medicine and Rehabilitation

## 2019-10-29 ENCOUNTER — Encounter: Payer: Medicare HMO | Attending: Physical Medicine and Rehabilitation | Admitting: Physical Medicine and Rehabilitation

## 2019-10-29 VITALS — BP 116/78 | HR 64 | Temp 97.7°F | Ht 61.0 in | Wt 282.0 lb

## 2019-10-29 DIAGNOSIS — R5383 Other fatigue: Secondary | ICD-10-CM | POA: Diagnosis not present

## 2019-10-29 DIAGNOSIS — I639 Cerebral infarction, unspecified: Secondary | ICD-10-CM

## 2019-10-29 MED ORDER — SENNOSIDES-DOCUSATE SODIUM 8.6-50 MG PO TABS: 1.0000 | ORAL_TABLET | Freq: Every day | ORAL | 0 refills | Status: DC

## 2019-10-29 MED ORDER — METHYLPHENIDATE HCL 5 MG PO TABS: 5.0000 mg | ORAL_TABLET | Freq: Two times a day (BID) | ORAL | 0 refills | Status: DC

## 2019-10-29 NOTE — Progress Notes (Signed)
Subjective:    Patient ID: Brandi Gomez, female    DOB: [birth year], 48 y.o.   MRN: [id number]  HPI Brandi Gomez presents for her hospital follow-up after suffering an acute CVA. It is a pleasure to see Brandi Gomez doing so much better. She is ambulating independently within her apartment and has been regularly attending physical therapy. She still feels very fatigued and a lack of enjoyment in activities, though she does not have depressed mood. She feels unable to do housework as a result. She has been walking but not for exercise. She has been straining when having a BM and was told she was prescribed constipation medication but did not have any at the pharmacy when she picked up her medications. She is accompanied today by her friend.   Pain Inventory Average Pain 0 Pain Right Now 0 My pain is na  In the last 24 hours, has pain interfered with the following? General activity 3 Relation with others 0 Enjoyment of life 3 What TIME of day is your pain at its worst? na Sleep (in general) Good  Pain is worse with: na Pain improves with: na Relief from Meds: na  Mobility Do you have any goals in this area?  no  Function Do you have any goals in this area?  no  Neuro/Psych confusion  Prior Studies CT/MRI  Physicians involved in your care Any changes since last visit?  no   Family History  Problem Relation Age of Onset  . Diabetes Mother        passed away from accidental death  . Hypertension Mother   . Bipolar disorder Father   . Diabetes Daughter   . Leukemia Daughter   . Ovarian cancer Maternal Aunt    Social History   Socioeconomic History  . Marital status: Single    Spouse name: Brandi Gomez  . Number of children: 3  . Years of education: 72  . Highest education level: Not on file  Occupational History  . Occupation: disabled    Employer: DISABLED  Social Needs  . Financial resource strain: Not on file  . Food insecurity    Worry: Never true   Inability: Never true  . Transportation needs    Medical: No    Non-medical: No  Tobacco Use  . Smoking status: Never Smoker  . Smokeless tobacco: Never Used  Substance and Sexual Activity  . Alcohol use: No  . Drug use: No  . Sexual activity: Never  Lifestyle  . Physical activity    Days per week: Not on file    Minutes per session: Not on file  . Stress: Not on file  Relationships  . Social Herbalist on phone: Not on file    Gets together: Not on file    Attends religious service: Not on file    Active member of club or organization: Not on file    Attends meetings of clubs or organizations: Not on file    Relationship status: Not on file  Other Topics Concern  . Not on file  Social History Narrative   Patient lives at home with daughter.    Patient has 3 children.    Patient is right handed.    Patient has a high school education.    Patient is on disability   Patient drinks 2 cups of caffeine daily.   Past Surgical History:  Procedure Laterality Date  . COLONOSCOPY     2012-normal , Brandi Gomez  .  ESOPHAGOGASTRODUODENOSCOPY     normal-Brandi Gomez 2012  . LOOP RECORDER INSERTION N/A 05/30/2017   Procedure: Loop Recorder Insertion;  Surgeon: Brandi Haw, MD;  Location: [facility];  Service: Cardiovascular;  Laterality: N/A;  . LOOP RECORDER REMOVAL N/A 03/04/2018   Procedure: LOOP RECORDER REMOVAL;  Surgeon: Brandi Haw, MD;  Location: [place] [facility];  Service: Cardiovascular;  Laterality: N/A;  . MYRINGOTOMY WITH TUBE PLACEMENT    . MYRINGOTOMY WITH TUBE PLACEMENT Right 11/05/2017   Procedure: MYRINGOTOMY WITH TUBE PLACEMENT;  Surgeon: Brandi Montane, MD;  Location: [facility];  Service: ENT;  Laterality: Right;  right T Tube placement  . NASAL SINUS SURGERY    . TEE WITHOUT CARDIOVERSION N/A 05/30/2017   Procedure: TRANSESOPHAGEAL ECHOCARDIOGRAM (TEE);  Surgeon: Brandi Fredrickson Wonda Cheng, MD;  Location: [facility] ENDOSCOPY;  Service:  Cardiovascular;  Laterality: N/A;   Past Medical History:  Diagnosis Date  . Anemia   . Anxiety   . Bipolar 1 disorder ([place])   . Common migraine 05/19/2015  . Depression   . Diabetes mellitus, type II ([place])   . Hypertension   . Irritable bowel syndrome (IBS)   . Mild mental retardation   . Obesity   . Partial complex seizure disorder with intractable epilepsy ([place]) 05/12/2014  . Seizures ([place])    intractable, sz 08/23/17  . Sleep apnea   . Stroke ([place])   . Type II or unspecified type diabetes mellitus without mention of complication, not stated as uncontrolled    BP 116/78   Pulse 64   Temp 97.7 F (36.5 C)   Ht 5\' 1"  (1.549 m)   Wt 282 lb (127.9 kg)   SpO2 98%   BMI 53.28 kg/m   Opioid Risk Score:   Fall Risk Score:  `1  Depression screen PHQ 2/9  Depression screen [facility] 2/9 09/01/2019 08/09/2019 06/21/2019 05/12/2019 05/06/2019 11/11/2018 05/12/2018  Decreased Interest 0 0 0 0 0 0 1  Down, Depressed, Hopeless 0 0 0 0 0 0 0  PHQ - 2 Score 0 0 0 0 0 0 1  Some recent data might be hidden   Review of Systems  Respiratory: Positive for apnea.   Skin: Positive for rash.  Psychiatric/Behavioral: Positive for confusion.  All other systems reviewed and are negative.      Objective:   Physical Exam  Gen: no distress, normal appearing HEENT: oral mucosa pink and moist, NCAT Cardio: Reg rate Chest: normal effort, normal rate of breathing Abd: soft, non-distended Ext: no edema Skin: intact Neuro/Musculoskeletal: AOx3. No focal deficits. Normal gait. Full flexion and extension, but low back pain reproduced. No spinal tenderness.  Psych: pleasant, normal affect    Assessment & Plan:  Brandi Gomez presents for her hospital follow-up after suffering an acute CVA.   -She is recovering very well. Recommended that she continue with outpatient PT and emphasized that she perform her HEP daily when not in therapy.  -Recommended heat packs for low back pain and tylenol or ibuprophen as  needed for temporary relief. Long-term treatment will be sitting less to put less pressure on her spine.  -Recommended increasing daily walking to 15 minutes outside for exercise. Discussed the physical and mental benefits of this.  -Recommended Ritalin 5mg  daily to improve her fatigue and allow her to better participate in housework and engage in her rehabilitation and exercise, with the goal of improving her function, quality of life, and overall health.  Thirty minutes of face to face  patient care time were spent during this visit. All questions were encouraged and answered. Follow up with me in 4 weeks.

## 2019-10-31 NOTE — Progress Notes (Signed)
Cardiology Clinic Note   Patient Name: Brandi Gomez Date of Encounter: 11/01/2019  Primary Care Provider:  Horald Pollen, MD Primary Cardiologist:  Skeet Latch, MD  Patient Profile    Brandi Gomez. Murch 48 year old female presents to the clinic today for follow-up of her essential hypertension and combined chronic systolic and diastolic heart failure.  Past Medical History    Past Medical History:  Diagnosis Date   Anemia    Anxiety    Bipolar 1 disorder ([place])    Common migraine 05/19/2015   Depression    Diabetes mellitus, type II ([place])    Hypertension    Irritable bowel syndrome (IBS)    Mild mental retardation    Obesity    Partial complex seizure disorder with intractable epilepsy ([place]) 05/12/2014   Seizures ([place])    intractable, sz 08/23/17   Sleep apnea    Stroke ([place])    Type II or unspecified type diabetes mellitus without mention of complication, not stated as uncontrolled    Past Surgical History:  Procedure Laterality Date   COLONOSCOPY     2012-normal , Dr Sharlett Iles   ESOPHAGOGASTRODUODENOSCOPY     normal-Dr Patterson 2012   LOOP RECORDER INSERTION N/A 05/30/2017   Procedure: Loop Recorder Insertion;  Surgeon: Constance Haw, MD;  Location: [facility];  Service: Cardiovascular;  Laterality: N/A;   LOOP RECORDER REMOVAL N/A 03/04/2018   Procedure: LOOP RECORDER REMOVAL;  Surgeon: Constance Haw, MD;  Location: [facility];  Service: Cardiovascular;  Laterality: N/A;   MYRINGOTOMY WITH TUBE PLACEMENT     MYRINGOTOMY WITH TUBE PLACEMENT Right 11/05/2017   Procedure: MYRINGOTOMY WITH TUBE PLACEMENT;  Surgeon: Melissa Montane, MD;  Location: [facility];  Service: ENT;  Laterality: Right;  right T Tube placement   NASAL SINUS SURGERY     TEE WITHOUT CARDIOVERSION N/A 05/30/2017   Procedure: TRANSESOPHAGEAL ECHOCARDIOGRAM (TEE);  Surgeon: Thayer Headings, MD;  Location[facility];  Service:  Cardiovascular;  Laterality: N/A;    Allergies  Allergies  Allergen Reactions   Amoxicillin Itching    Has patient had a PCN reaction causing immediate rash, facial/tongue/throat swelling, SOB or lightheadedness with hypotension: yes Has patient had a PCN reaction causing severe rash involving mucus membranes or skin necrosis: no Has patient had a PCN reaction that required hospitalization: no Has patient had a PCN reaction occurring within the last 10 years: yes If all of the above answers are "NO", then may proceed with Cephalosporin use.   Lisinopril Swelling    Angioedema    Hydrocodone Other (See Comments)    Depressed    Tegretol [Carbamazepine] Swelling    Throat swells    History of Present Illness    [name] has a past medical history of chronic systolic and diastolic heart failure, angioedema (lisinopril), hyperlipidemia, morbid obesity, CKD 3, OSA on CPAP, anxiety, bipolar, hypertension, epilepsy, sleep apnea, type 2 diabetes, and CVA.  She had a Linq loop recorder monitor implanted after her CVA on 05/30/2017.  The device was then removed on 02/2018 due to patient concerns and vibration of the device.  She was seen in the clinic by Dr. Curt Bears on 02/25/2018.  She denied palpitations, chest pain, shortness of breath, orthopnea, PND, lower extremity edema, dizziness, presyncope, syncope, bleeding and neurologic symptoms.  She was tolerating her medications without difficulty and wished to have her loop recorder removed at that time.  She was not noticing any fatigue or increased  shortness of breath at that time.  She gave no reason for wishing to have the device removed.  Her loop recorder was then removed by Dr. Curt Bears on 03/04/2018.  She was then seen by Dr. Oval Linsey on 10/15/2019 for consult due to worsening systolic function.  An echocardiogram was completed on 10/12/2019 which showed an LVEF of 35 to 40%, mildly increased LVH and grade 2 diastolic dysfunction.  Not a  candidate for ACE inhibitor/ARB or ARNI due to history of angioedema.  Her carotid Doppler showed bilateral ICA 1 to 39% stenosis.  She was scheduled for a follow-up TEE to evaluate for thrombus after recurrent CVA.  Her TEE was scheduled for 10/18/2019 however, it was not completed.  She will also need a ischemic evaluation in the near future however due to her recent CVA we will postpone for now.  She presents the clinic today and states she feels well.  She states she has been fairly sedentary since she has been discharged from the hospital.  She states she does try to walk some in her house.  She also states that she has been eating much more food and not following a low-sodium diet.  She states that she monitors her blood pressure at home with a wrist cuff but is not sure how accurate it is due to the fluctuations in blood pressure readings she receives.  I will order a BMP, TEE, and restart her on low-dose carvedilol today.  She denies chest pain, shortness of breath, lower extremity edema, palpitations, melena, hematuria, hemoptysis, diaphoresis, weakness, presyncope, syncope, orthopnea, and PND.  Home Medications    Prior to Admission medications   Medication Sig Start Date End Date Taking? Authorizing Provider  acetaminophen (TYLENOL) 325 MG tablet Take 2 tablets (650 mg total) by mouth every 4 (four) hours as needed for mild pain (or temp > 37.5 C (99.5 F)). 10/19/19   Angiulli, Lavon Paganini, PA-C  ARIPiprazole (ABILIFY) 10 MG tablet Take 1 tablet (10 mg total) by mouth daily. 10/19/19   Angiulli, Lavon Paganini, PA-C  aspirin EC 81 MG EC tablet Take 1 tablet (81 mg total) by mouth daily. 10/16/19   Antonieta Pert, MD  atorvastatin (LIPITOR) 40 MG tablet Take 1 tablet (40 mg total) by mouth daily at 6 PM. 10/19/19   Angiulli, Lavon Paganini, PA-C  clopidogrel (PLAVIX) 75 MG tablet Take 1 tablet (75 mg total) by mouth daily for 21 days. 10/19/19 11/09/19  Angiulli, Lavon Paganini, PA-C  hydrALAZINE (APRESOLINE) 10 MG  tablet Take 1 tablet (10 mg total) by mouth 2 (two) times daily. 10/19/19   Angiulli, Lavon Paganini, PA-C  isosorbide dinitrate (ISORDIL) 10 MG tablet Take 1 tablet (10 mg total) by mouth 2 (two) times daily. 10/19/19   Angiulli, Lavon Paganini, PA-C  lamoTRIgine (LAMICTAL) 100 MG tablet Take 1 tablet (100 mg total) by mouth 2 (two) times daily. 10/19/19   Angiulli, Lavon Paganini, PA-C  methylphenidate (RITALIN) 5 MG tablet Take 1 tablet (5 mg total) by mouth 2 (two) times daily. 10/29/19   Raulkar, Clide Deutscher, MD  polyethylene glycol (MIRALAX / GLYCOLAX) 17 g packet Take 17 g by mouth daily. 10/16/19   Antonieta Pert, MD  senna-docusate (SENOKOT-S) 8.6-50 MG tablet Take 1 tablet by mouth daily. 10/29/19   Raulkar, Clide Deutscher, MD  torsemide (DEMADEX) 10 MG tablet Take 1 tablet (10 mg total) by mouth daily. 10/19/19   Angiulli, Lavon Paganini, PA-C  traZODone (DESYREL) 100 MG tablet Take 1 tablet (100 mg total)  by mouth at bedtime. 10/19/19   Angiulli, Lavon Paganini, PA-C  Vitamin D, Ergocalciferol, (DRISDOL) 1.25 MG (50000 UT) CAPS capsule Take 1 capsule (50,000 Units total) by mouth every 7 (seven) days. 10/19/19   Angiulli, Lavon Paganini, PA-C    Family History    Family History  Problem Relation Age of Onset   Diabetes Mother        passed away from accidental death   Hypertension Mother    Bipolar disorder Father    Diabetes Daughter    Leukemia Daughter    Ovarian cancer Maternal Aunt    She indicated that her mother is deceased. She indicated that her father is alive. She indicated that her brother is alive. She indicated that her maternal grandmother is deceased. She indicated that her maternal grandfather is deceased. She indicated that her paternal grandmother is deceased. She indicated that her paternal grandfather is deceased. She indicated that her daughter is alive. She indicated that the status of her maternal aunt is unknown.  Social History    Social History   Socioeconomic History   Marital status:  Single    Spouse name: [name]   Number of children: 3   Years of education: 12   Highest education level: Not on file  Occupational History   Occupation: disabled    Fish farm manager: DISABLED  Social Designer, fashion/clothing strain: Not on file   Food insecurity    Worry: Never true    Inability: Never true   Transportation needs    Medical: No    Non-medical: No  Tobacco Use   Smoking status: Never Smoker   Smokeless tobacco: Never Used  Substance and Sexual Activity   Alcohol use: No   Drug use: No   Sexual activity: Never  Lifestyle   Physical activity    Days per week: Not on file    Minutes per session: Not on file   Stress: Not on file  Relationships   Social connections    Talks on phone: Not on file    Gets together: Not on file    Attends religious service: Not on file    Active member of club or organization: Not on file    Attends meetings of clubs or organizations: Not on file    Relationship status: Not on file   Intimate partner violence    Fear of current or ex partner: Not on file    Emotionally abused: Not on file    Physically abused: Not on file    Forced sexual activity: Not on file  Other Topics Concern   Not on file  Social History Narrative   Patient lives at home with daughter.    Patient has 3 children.    Patient is right handed.    Patient has a high school education.    Patient is on disability   Patient drinks 2 cups of caffeine daily.     Review of Systems    General:  No chills, fever, night sweats or weight changes.  Cardiovascular:  No chest pain, dyspnea on exertion, edema, orthopnea, palpitations, paroxysmal nocturnal dyspnea. Dermatological: No rash, lesions/masses Respiratory: No cough, dyspnea Urologic: No hematuria, dysuria Abdominal:   No nausea, vomiting, diarrhea, bright red blood per rectum, melena, or hematemesis Neurologic:  No visual changes, wkns, changes in mental status. All other systems  reviewed and are otherwise negative except as noted above.  Physical Exam    VS:  BP 128/73  Pulse 73    Temp (!) 97.3 F (36.3 C)    Ht 5\' 1"  (1.549 m)    Wt 281 lb (127.5 kg)    SpO2 98%    BMI 53.09 kg/m  , BMI Body mass index is 53.09 kg/m. GEN: Well nourished, well developed, in no acute distress. HEENT: normal. Neck: Supple, no JVD, carotid bruits, or masses. Cardiac: RRR, no murmurs, rubs, or gallops. No clubbing, cyanosis, edema.  Radials/DP/PT 2+ and equal bilaterally.  Respiratory:  Respirations regular and unlabored, clear to auscultation bilaterally. GI: Soft, nontender, nondistended, BS + x 4. MS: no deformity or atrophy. Skin: warm and dry, no rash. Neuro:  Strength and sensation are intact. Psych: Normal affect.  Accessory Clinical Findings    ECG personally reviewed by me today-none today- No acute changes  Echo 10/12/19: IMPRESSIONS  1. Left ventricular ejection fraction, by visual estimation, is 35 to 40%. The left ventricle has moderately decreased function. There is mildly increased left ventricular hypertrophy. 2. Left ventricular diastolic parameters are consistent with Grade II diastolic dysfunction (pseudonormalization). 3. Global right ventricle has normal systolic function.The right ventricular size is normal. 4. Left atrial size was normal. 5. Right atrial size was normal. 6. The mitral valve is normal in structure. No evidence of mitral valve regurgitation. No evidence of mitral stenosis. 7. The tricuspid valve is normal in structure. Tricuspid valve regurgitation is trivial. 8. The aortic valve is normal in structure. Aortic valve regurgitation is not visualized. No evidence of aortic valve sclerosis or stenosis. 9. The pulmonic valve was not well visualized. Pulmonic valve regurgitation is not visualized. 10. Technically difficult; moderate global reduction in LV systolic function; grade 2 diastolic dysfunction; mild LVH.  Carotid  Doppler: Summary: Right Carotid: Velocities in the right ICA are consistent with a 1-39% stenosis.  Left Carotid: Velocities in the left ICA are consistent with a 1-39% stenosis.  Assessment & Plan   1.  Chronic systolic and diastolic heart [id number] echocardiogram showed reduced LVEF from 40 -45% to 35 - 40%.  Her beta-blocker was held during her recent admission due to bradycardia.  She has a history of angioedema with lisinopril so no ARB or ARNI.  Hospital course  added low-dose hydralazine/nitrates. Continue hydralazine 10 mg twice daily Continue isosorbide mononitrate 10 mg twice daily Order BMP and CBC Order carvedilol 3.125 twice daily Plan for future ischemic evaluation Heart healthy low-sodium diet-salty 6 given Increase physical activity as tolerated Daily weights-heart failure action plan given.  Recurrent CVA-no atrial fibrillation/PFO noted with prior stroke.  Loop recorder removed by EP 03/04/2018 due to patient discomfort. Continue aspirin 81 Continue clopidogrel 75 mg daily Order TEE to assess for thrombus  Carotid stenosis-carotid Doppler showed bilateral mild 1-39% ICA stenosis. Continue aspirin Continue clopidogrel Continue atorvastatin 40 mg daily Heart healthy low-sodium diet high-fiber  Disposition: Follow-up with Dr. Oval Linsey in after TEE.  Jossie Ng. [facility] [address] Office [phone] Fax [phone]

## 2019-11-01 ENCOUNTER — Ambulatory Visit (INDEPENDENT_AMBULATORY_CARE_PROVIDER_SITE_OTHER): Payer: Medicare HMO | Admitting: General Practice

## 2019-11-01 ENCOUNTER — Other Ambulatory Visit: Payer: Self-pay

## 2019-11-01 ENCOUNTER — Encounter: Payer: Self-pay | Admitting: General Practice

## 2019-11-01 VITALS — BP 128/73 | HR 73 | Temp 97.3°F | Ht 61.0 in | Wt 281.0 lb

## 2019-11-01 DIAGNOSIS — I6523 Occlusion and stenosis of bilateral carotid arteries: Secondary | ICD-10-CM

## 2019-11-01 DIAGNOSIS — I5042 Chronic combined systolic (congestive) and diastolic (congestive) heart failure: Secondary | ICD-10-CM | POA: Diagnosis not present

## 2019-11-01 DIAGNOSIS — I639 Cerebral infarction, unspecified: Secondary | ICD-10-CM | POA: Diagnosis not present

## 2019-11-01 DIAGNOSIS — Z79899 Other long term (current) drug therapy: Secondary | ICD-10-CM | POA: Diagnosis not present

## 2019-11-01 LAB — BASIC METABOLIC PANEL
BUN/Creatinine Ratio: 13 (ref 9–23)
BUN: 19 mg/dL (ref 6–24)
CO2: 22 mmol/L (ref 20–29)
Calcium: 8.8 mg/dL (ref 8.7–10.2)
Chloride: 104 mmol/L (ref 96–106)
Creatinine, Ser: 1.45 mg/dL — ABNORMAL HIGH (ref 0.57–1.00)
GFR calc Af Amer: 48 mL/min/{1.73_m2} — ABNORMAL LOW (ref >59)
GFR calc non Af Amer: 42 mL/min/{1.73_m2} — ABNORMAL LOW (ref >59)
Glucose: 123 mg/dL — ABNORMAL HIGH (ref 65–99)
Potassium: 4.7 mmol/L (ref 3.5–5.2)
Sodium: 139 mmol/L (ref 134–144)

## 2019-11-01 LAB — CBC
Hematocrit: 28.9 % — ABNORMAL LOW (ref 34.0–46.6)
Hemoglobin: 9.3 g/dL — ABNORMAL LOW (ref 11.1–15.9)
MCH: 27.9 pg (ref 26.6–33.0)
MCHC: 32.2 g/dL (ref 31.5–35.7)
MCV: 87 fL (ref 79–97)
Platelets: 301 10*3/uL (ref 150–450)
RBC: 3.33 x10E6/uL — ABNORMAL LOW (ref 3.77–5.28)
RDW: 13.9 % (ref 11.7–15.4)
WBC: 5.3 10*3/uL (ref 3.4–10.8)

## 2019-11-01 MED ORDER — CARVEDILOL 3.125 MG PO TABS: 3.1250 mg | ORAL_TABLET | Freq: Two times a day (BID) | ORAL | 3 refills | Status: DC

## 2019-11-01 NOTE — Patient Instructions (Signed)
Follow-Up: AFTER TEE In Person WITH You may see Skeet Latch, MD, Coletta Memos, FNP or one of the following Advanced Practice Providers on your designated Care Team:  Kerin Ransom, PA-C  [place], [place].    PLEASE READ AND FOLLOW THE SALTY SIX (ATTACHED).  PLEASE READ AND FOLLOW Heart Failure Action Plan BELOW.   You are scheduled for a TEE on 11-09-2019 with Dr. Oval Linsey.  Please arrive at the [facility] (Main Entrance A) at [facility]: [address] [place], [place] [address] at [place] TESTING 11-05-2019 AT [place] ([place] (NOT A TENT)) YOU WILL NEED TO SELF QUARANTINE AT [place] THE TEE DONE.  DIET: Nothing to eat or drink after midnight except a sip of water with medications (see medication instructions below)  Medication Instructions:  Continue your anticoagulant: PLAVIX AND ASPIRIN You will need to continue your anticoagulant after your procedure.  Labs:  HERE AT [place] OFFICE TODAY 11-01-2019  You must have a responsible person to drive you home and stay in the waiting area during your procedure. Failure to do so could result in cancellation.  Bring your insurance cards.  *Special Note: Every effort is made to have your procedure done on time. Occasionally there are emergencies that occur at the hospital that may cause delays. Please be patient if a delay does occur.   Heart Failure Action Plan A heart failure action plan helps you understand what to do when you have symptoms of heart failure. Follow the plan that was created by you and your health care provider. Review your plan each time you visit your health care provider. Red zone These signs and symptoms mean you should get medical help right away:  You have trouble breathing  when resting.  You have a dry cough that is getting worse.  You have swelling or pain in your legs or abdomen that is getting worse.  You suddenly gain more than 2-3 lb (0.9-1.4 kg) in a day, or more than 5 lb (2.3 kg) in one week. This amount may be more or less depending on your condition.  You have trouble staying awake or you feel confused.  You have chest pain.  You do not have an appetite.  You pass out. If you experience any of these symptoms:  Call your local emergency services (911 in the [place] right away or seek help at the emergency department of the nearest hospital. Yellow zone These signs and symptoms mean your condition may be getting worse and you should make some changes:  You have trouble breathing when you are active or you need to sleep with extra pillows.  You have swelling in your legs or abdomen.  You gain 2-3 lb (0.9-1.4 kg) in one day, or 5 lb (2.3 kg) in one week. This amount may be more or less depending on your condition.  You get tired easily.  You have trouble sleeping.  You have a dry cough. If you experience any of these symptoms:  Contact your health care provider within the next day.  Your health care provider may adjust your medicines. Green zone These signs mean you are doing well and can continue what you are doing:  You do not have shortness of breath.  You  have very little swelling or no new swelling.  Your weight is stable (no gain or loss).  You have a normal activity level.  You do not have chest pain or any other new symptoms. Follow these instructions at home:  Take over-the-counter and prescription medicines only as told by your health care provider.  Weigh yourself daily. Your target weight is  275_ lb ? Call your health care provider if you gain more than  3  Lb in a day,  ? or more than   5   lb in one week.  Eat a heart-healthy diet. Work with a diet and nutrition specialist (dietitian) to create an eating plan  that is best for you.  Keep all follow-up visits as told by your health care provider. This is important. Where to find more information  American Heart Association: www.heart.org Summary  Follow the action plan that was created by you and your health care provider.  Get help right away if you have any symptoms in the Red zone. This information is not intended to replace advice given to you by your health care provider. Make sure you discuss any questions you have with your health care provider. Document Released: 01/04/2017 Document Revised: 11/07/2017 Document Reviewed: 01/04/2017 Elsevier Patient Education  2020 [employer].

## 2019-11-02 DIAGNOSIS — N183 Chronic kidney disease, stage 3 unspecified: Secondary | ICD-10-CM | POA: Diagnosis not present

## 2019-11-02 DIAGNOSIS — F319 Bipolar disorder, unspecified: Secondary | ICD-10-CM | POA: Diagnosis not present

## 2019-11-02 DIAGNOSIS — G4733 Obstructive sleep apnea (adult) (pediatric): Secondary | ICD-10-CM | POA: Diagnosis not present

## 2019-11-02 DIAGNOSIS — I130 Hypertensive heart and chronic kidney disease with heart failure and stage 1 through stage 4 chronic kidney disease, or unspecified chronic kidney disease: Secondary | ICD-10-CM | POA: Diagnosis not present

## 2019-11-02 DIAGNOSIS — I5042 Chronic combined systolic (congestive) and diastolic (congestive) heart failure: Secondary | ICD-10-CM | POA: Diagnosis not present

## 2019-11-02 DIAGNOSIS — R29898 Other symptoms and signs involving the musculoskeletal system: Secondary | ICD-10-CM | POA: Diagnosis not present

## 2019-11-02 DIAGNOSIS — E1142 Type 2 diabetes mellitus with diabetic polyneuropathy: Secondary | ICD-10-CM | POA: Diagnosis not present

## 2019-11-02 DIAGNOSIS — E1122 Type 2 diabetes mellitus with diabetic chronic kidney disease: Secondary | ICD-10-CM | POA: Diagnosis not present

## 2019-11-02 DIAGNOSIS — I69398 Other sequelae of cerebral infarction: Secondary | ICD-10-CM | POA: Diagnosis not present

## 2019-11-03 ENCOUNTER — Ambulatory Visit: Payer: Medicare HMO | Admitting: Neurology

## 2019-11-03 ENCOUNTER — Encounter: Payer: Self-pay | Admitting: Neurology

## 2019-11-03 ENCOUNTER — Telehealth: Payer: Self-pay | Admitting: Neurology

## 2019-11-03 ENCOUNTER — Telehealth: Payer: Self-pay | Admitting: Cardiovascular Disease

## 2019-11-03 NOTE — Telephone Encounter (Signed)
Attempted to contact patient to discuss why she wanted it to be cancelled, but I did call and cancel the appointment and the COVID testing. Left call back number. Will route to provider who ordered to make aware.

## 2019-11-03 NOTE — Telephone Encounter (Signed)
This patient did not show for a revisit appointment today. 

## 2019-11-03 NOTE — Telephone Encounter (Signed)
New message  Patient is calling in to cancel covid test and TEE scheduled for 11/09/19. Patient states that she read about it and has changed her mind about getting it. Please give patient a call back to confirm cancellation.

## 2019-11-05 ENCOUNTER — Other Ambulatory Visit (HOSPITAL_COMMUNITY): Payer: Medicare HMO

## 2019-11-08 ENCOUNTER — Telehealth (INDEPENDENT_AMBULATORY_CARE_PROVIDER_SITE_OTHER): Payer: Medicare HMO | Admitting: General Practice

## 2019-11-08 ENCOUNTER — Telehealth: Payer: Self-pay | Admitting: General Practice

## 2019-11-08 VITALS — BP 134/93 | HR 74

## 2019-11-08 DIAGNOSIS — G4733 Obstructive sleep apnea (adult) (pediatric): Secondary | ICD-10-CM

## 2019-11-08 DIAGNOSIS — N183 Chronic kidney disease, stage 3 unspecified: Secondary | ICD-10-CM | POA: Diagnosis not present

## 2019-11-08 DIAGNOSIS — I130 Hypertensive heart and chronic kidney disease with heart failure and stage 1 through stage 4 chronic kidney disease, or unspecified chronic kidney disease: Secondary | ICD-10-CM | POA: Diagnosis not present

## 2019-11-08 DIAGNOSIS — Z8673 Personal history of transient ischemic attack (TIA), and cerebral infarction without residual deficits: Secondary | ICD-10-CM

## 2019-11-08 DIAGNOSIS — F319 Bipolar disorder, unspecified: Secondary | ICD-10-CM

## 2019-11-08 DIAGNOSIS — F419 Anxiety disorder, unspecified: Secondary | ICD-10-CM

## 2019-11-08 DIAGNOSIS — E785 Hyperlipidemia, unspecified: Secondary | ICD-10-CM | POA: Diagnosis not present

## 2019-11-08 DIAGNOSIS — I6523 Occlusion and stenosis of bilateral carotid arteries: Secondary | ICD-10-CM | POA: Diagnosis not present

## 2019-11-08 DIAGNOSIS — G40909 Epilepsy, unspecified, not intractable, without status epilepticus: Secondary | ICD-10-CM | POA: Diagnosis not present

## 2019-11-08 DIAGNOSIS — I5042 Chronic combined systolic (congestive) and diastolic (congestive) heart failure: Secondary | ICD-10-CM

## 2019-11-08 DIAGNOSIS — Z7902 Long term (current) use of antithrombotics/antiplatelets: Secondary | ICD-10-CM

## 2019-11-08 DIAGNOSIS — Z7982 Long term (current) use of aspirin: Secondary | ICD-10-CM

## 2019-11-08 DIAGNOSIS — Z79899 Other long term (current) drug therapy: Secondary | ICD-10-CM

## 2019-11-08 DIAGNOSIS — Z9989 Dependence on other enabling machines and devices: Secondary | ICD-10-CM

## 2019-11-08 DIAGNOSIS — I10 Essential (primary) hypertension: Secondary | ICD-10-CM

## 2019-11-08 DIAGNOSIS — E1122 Type 2 diabetes mellitus with diabetic chronic kidney disease: Secondary | ICD-10-CM | POA: Diagnosis not present

## 2019-11-08 DIAGNOSIS — I639 Cerebral infarction, unspecified: Secondary | ICD-10-CM

## 2019-11-08 NOTE — H&P (View-Only) (Signed)
Virtual Visit via Telephone Note   This visit type was conducted due to national recommendations for restrictions regarding the COVID-19 Pandemic (e.g. social distancing) in an effort to limit this patient's exposure and mitigate transmission in our community.  Due to her co-morbid illnesses, this patient is at least at moderate risk for complications without adequate follow up.  This format is felt to be most appropriate for this patient at this time.  The patient did not have access to video technology/had technical difficulties with video requiring transitioning to audio format only (telephone).  All issues noted in this document were discussed and addressed.  No physical exam could be performed with this format.  Please refer to the patient's chart for her  consent to telehealth for [facility].  Evaluation Performed:  Follow-up visit  This visit type was conducted due to national recommendations for restrictions regarding the COVID-19 Pandemic (e.g. social distancing).  This format is felt to be most appropriate for this patient at this time.  All issues noted in this document were discussed and addressed.  No physical exam was performed (except for noted visual exam findings with Video Visits).  Please refer to the patient's chart (MyChart message for video visits and phone note for telephone visits) for the patient's consent to telehealth for [facility]  Date:  11/08/2019   ID:  Brandi Gomez, DOB 07-12-1971, MRN [id number]  Patient Location:  [address] [place] [id number]   Provider location:   [place] [address] [phone] Fax [phone]   PCP:  Horald Pollen, MD  Cardiologist:  Skeet Latch, MD  Electrophysiologist:  Will Meredith Leeds, MD   Chief Complaint: TEE explanation  History of Present Illness:    Brandi Gomez is a 48 y.o. female who presents via audio/video  conferencing for a telehealth visit today.  Patient verified DOB and address.  The patient does not symptoms concerning for COVID-19 infection (fever, chills, cough, or new SHORTNESS OF BREATH).   Brandi Gomez has a past medical history of chronic systolic and diastolic heart failure, angioedema (lisinopril), hyperlipidemia, morbid obesity, CKD 3, OSA on CPAP, anxiety, bipolar, hypertension, epilepsy, sleep apnea, type 2 diabetes, and CVA.  She had a Linq loop recorder monitor implanted after her CVA on 05/30/2017.  The device was then removed on 02/2018 due to patient concerns and vibration of the device.  She was seen in the clinic by Dr. Curt Bears on 02/25/2018.  She denied palpitations, chest pain, shortness of breath, orthopnea, PND, lower extremity edema, dizziness, presyncope, syncope, bleeding and neurologic symptoms.  She was tolerating her medications without difficulty and wished to have her loop recorder removed at that time.  She was not noticing any fatigue or increased shortness of breath at that time.  She gave no reason for wishing to have the device removed.  Her loop recorder was then removed by Dr. Curt Bears on 03/04/2018.  She was then seen by Dr. Oval Linsey on 10/15/2019 for consult due to worsening systolic function.  An echocardiogram was completed on 10/12/2019 which showed an LVEF of 35 to 40%, mildly increased LVH and grade 2 diastolic dysfunction.  Not a candidate for ACE inhibitor/ARB or ARNI due to history of angioedema.  Her carotid Doppler showed bilateral ICA 1 to 39% stenosis.  She was scheduled for a follow-up TEE to evaluate for thrombus after recurrent CVA.  Her TEE was scheduled for 10/18/2019 however, it was  not completed.  She will also need a ischemic evaluation in the near future however due to her recent CVA we will postpone for now.  She is contacted via virtual platform today after she canceled her TEE.  She contacted the triage nurse after reading information online  about the procedure.  She states that she read information indicating that she would have to swallow something.  This made her uncomfortable because she did not know the level of sedation she would receive.  I have explained the procedure thoroughly and answered all of her questions about the TEE.  We will reschedule her TEE to evaluate for thrombus after recurrent CVA.  She denies chest pain, shortness of breath, lower extremity edema, palpitations, melena, hematuria, hemoptysis, diaphoresis, weakness, presyncope, syncope, orthopnea, and PND.   Prior CV studies:   The following studies were reviewed today:  Echo 10/12/19: IMPRESSIONS  1. Left ventricular ejection fraction, by visual estimation, is 35 to 40%. The left ventricle has moderately decreased function. There is mildly increased left ventricular hypertrophy. 2. Left ventricular diastolic parameters are consistent with Grade II diastolic dysfunction (pseudonormalization). 3. Global right ventricle has normal systolic function.The right ventricular size is normal. 4. Left atrial size was normal. 5. Right atrial size was normal. 6. The mitral valve is normal in structure. No evidence of mitral valve regurgitation. No evidence of mitral stenosis. 7. The tricuspid valve is normal in structure. Tricuspid valve regurgitation is trivial. 8. The aortic valve is normal in structure. Aortic valve regurgitation is not visualized. No evidence of aortic valve sclerosis or stenosis. 9. The pulmonic valve was not well visualized. Pulmonic valve regurgitation is not visualized. 10. Technically difficult; moderate global reduction in LV systolic function; grade 2 diastolic dysfunction; mild LVH.  Carotid Doppler: Summary: Right Carotid: Velocities in the right ICA are consistent with a 1-39% stenosis.  Left Carotid: Velocities in the left ICA are consistent with a 1-39% stenosis.  Past Medical History:  Diagnosis Date  . Anemia   .  Anxiety   . Bipolar 1 disorder ([place])   . Common migraine 05/19/2015  . Depression   . Diabetes mellitus, type II ([place])   . Hypertension   . Irritable bowel syndrome (IBS)   . Mild mental retardation   . Obesity   . Partial complex seizure disorder with intractable epilepsy ([place]) 05/12/2014  . Seizures ([place])    intractable, sz 08/23/17  . Sleep apnea   . Stroke ([place])   . Type II or unspecified type diabetes mellitus without mention of complication, not stated as uncontrolled    Past Surgical History:  Procedure Laterality Date  . COLONOSCOPY     2012-normal , Dr Sharlett Iles  . ESOPHAGOGASTRODUODENOSCOPY     normal-Dr Patterson 2012  . LOOP RECORDER INSERTION N/A 05/30/2017   Procedure: Loop Recorder Insertion;  Surgeon: Constance Haw, MD;  Location: [facility];  Service: Cardiovascular;  Laterality: N/A;  . LOOP RECORDER REMOVAL N/A 03/04/2018   Procedure: LOOP RECORDER REMOVAL;  Surgeon: Constance Haw, MD;  Location: [facility];  Service: Cardiovascular;  Laterality: N/A;  . MYRINGOTOMY WITH TUBE PLACEMENT    . MYRINGOTOMY WITH TUBE PLACEMENT Right 11/05/2017   Procedure: MYRINGOTOMY WITH TUBE PLACEMENT;  Surgeon: Melissa Montane, MD;  Location: [facility];  Service: ENT;  Laterality: Right;  right T Tube placement  . NASAL SINUS SURGERY    . TEE WITHOUT CARDIOVERSION N/A 05/30/2017   Procedure: TRANSESOPHAGEAL ECHOCARDIOGRAM (TEE);  Surgeon: Acie Fredrickson,  Wonda Cheng, MD;  Location: [facility] ENDOSCOPY;  Service: Cardiovascular;  Laterality: N/A;     Current Meds  Medication Sig  . acetaminophen (TYLENOL) 325 MG tablet Take 2 tablets (650 mg total) by mouth every 4 (four) hours as needed for mild pain (or temp > 37.5 C (99.5 F)).  Marland Kitchen ARIPiprazole (ABILIFY) 10 MG tablet Take 1 tablet (10 mg total) by mouth daily.  Marland Kitchen aspirin EC 81 MG EC tablet Take 1 tablet (81 mg total) by mouth daily.  Marland Kitchen atorvastatin (LIPITOR) 40 MG tablet Take 1 tablet (40 mg total) by mouth daily at 6 PM.  .  carvedilol (COREG) 3.125 MG tablet Take 1 tablet (3.125 mg total) by mouth 2 (two) times daily.  . clopidogrel (PLAVIX) 75 MG tablet Take 1 tablet (75 mg total) by mouth daily for 21 days.  . hydrALAZINE (APRESOLINE) 10 MG tablet Take 1 tablet (10 mg total) by mouth 2 (two) times daily.  . isosorbide dinitrate (ISORDIL) 10 MG tablet Take 1 tablet (10 mg total) by mouth 2 (two) times daily.  Marland Kitchen lamoTRIgine (LAMICTAL) 100 MG tablet Take 1 tablet (100 mg total) by mouth 2 (two) times daily.  . methylphenidate (RITALIN) 5 MG tablet Take 1 tablet (5 mg total) by mouth 2 (two) times daily.  . polyethylene glycol (MIRALAX / GLYCOLAX) 17 g packet Take 17 g by mouth daily.  Marland Kitchen senna-docusate (SENOKOT-S) 8.6-50 MG tablet Take 1 tablet by mouth daily.  Marland Kitchen torsemide (DEMADEX) 10 MG tablet Take 1 tablet (10 mg total) by mouth daily.  . traZODone (DESYREL) 100 MG tablet Take 1 tablet (100 mg total) by mouth at bedtime.  . Vitamin D, Ergocalciferol, (DRISDOL) 1.25 MG (50000 UT) CAPS capsule Take 1 capsule (50,000 Units total) by mouth every 7 (seven) days.     Allergies:   Amoxicillin, Lisinopril, Hydrocodone, and Tegretol [carbamazepine]   Social History   Tobacco Use  . Smoking status: Never Smoker  . Smokeless tobacco: Never Used  Substance Use Topics  . Alcohol use: No  . Drug use: No     Family Hx: The patient's family history includes Bipolar disorder in her father; Diabetes in her daughter and mother; Hypertension in her mother; Leukemia in her daughter; Ovarian cancer in her maternal aunt.  ROS:   Please see the history of present illness.     All other systems reviewed and are negative.   Labs/Other Tests and Data Reviewed:    Recent Labs: 05/14/2019: B Natriuretic Peptide 22.4 10/13/2019: Magnesium 1.8 10/18/2019: ALT 13 11/01/2019: BUN 19; Creatinine, Ser 1.45; Hemoglobin 9.3; Platelets 301; Potassium 4.7; Sodium 139   Recent Lipid Panel Lab Results  Component Value Date/Time   CHOL  123 10/12/2019 03:19 AM   TRIG 49 10/12/2019 03:19 AM   HDL 49 10/12/2019 03:19 AM   CHOLHDL 2.5 10/12/2019 03:19 AM   LDLCALC 64 10/12/2019 03:19 AM    Wt Readings from Last 3 Encounters:  11/01/19 281 lb (127.5 kg)  10/29/19 282 lb (127.9 kg)  10/20/19 260 lb 2.3 oz (118 kg)     Exam:    Vital Signs:  BP (!) 134/93   Pulse 74    Well nourished, well developed female in no  acute distress.   ASSESSMENT & PLAN:      Chronic systolic and diastolic heart [id number] echocardiogram showed reduced LVEF from 40 -45% to 35 - 40%.  Her beta-blocker was held during her recent admission due to bradycardia.  She has a history  of angioedema with lisinopril so no ARB or ARNI.  Hospital course  added low-dose hydralazine/nitrates. Continue hydralazine 10 mg twice daily Continue isosorbide mononitrate 10 mg twice daily Order BMP and CBC Continue carvedilol 3.125 twice daily Plan for future ischemic evaluation Heart healthy low-sodium diet-salty 6 given Increase physical activity as tolerated Daily weights-heart failure action plan given.  Recurrent CVA-no atrial fibrillation/PFO noted with prior stroke.  Loop recorder removed by EP 03/04/2018 due to patient discomfort. Continue aspirin 81 Continue clopidogrel 75 mg daily Reorder TEE to assess for thrombus  Carotid stenosis-carotid Doppler showed bilateral mild 1-39% ICA stenosis. Continue aspirin Continue clopidogrel Continue atorvastatin 40 mg daily Heart healthy low-sodium diet high-fiber  COVID-19 Education: The signs and symptoms of COVID-19 were discussed with the patient and how to seek care for testing (follow up with PCP or arrange E-visit).  The importance of social distancing was discussed today.  Patient Risk:   After full review of this patients clinical status, I feel that they are at least moderate risk at this time.  Time:   Today, I have spent 10 minutes with the patient with telehealth technology discussing  TEE procedure and benefits of proceeding with the evaluation.     Medication Adjustments/Labs and Tests Ordered: Current medicines are reviewed at length with the patient today.  Concerns regarding medicines are outlined above.   Tests Ordered: Orders Placed This Encounter  Procedures  . BMET  . CBC   Medication Changes: No orders of the defined types were placed in this encounter.   Disposition: Follow-up with me in 1 month(s)  Signed,  Jossie Ng. [facility] [place] [address] Office [phone] Fax [phone]

## 2019-11-08 NOTE — Progress Notes (Signed)
Virtual Visit via Telephone Note   This visit type was conducted due to national recommendations for restrictions regarding the COVID-19 Pandemic (e.g. social distancing) in an effort to limit this patient's exposure and mitigate transmission in our community.  Due to her co-morbid illnesses, this patient is at least at moderate risk for complications without adequate follow up.  This format is felt to be most appropriate for this patient at this time.  The patient did not have access to video technology/had technical difficulties with video requiring transitioning to audio format only (telephone).  All issues noted in this document were discussed and addressed.  No physical exam could be performed with this format.  Please refer to the patient's chart for her  consent to telehealth for [facility].  Evaluation Performed:  Follow-up visit  This visit type was conducted due to national recommendations for restrictions regarding the COVID-19 Pandemic (e.g. social distancing).  This format is felt to be most appropriate for this patient at this time.  All issues noted in this document were discussed and addressed.  No physical exam was performed (except for noted visual exam findings with Video Visits).  Please refer to the patient's chart (MyChart message for video visits and phone note for telephone visits) for the patient's consent to telehealth for [facility]  Date:  11/08/2019   ID:  Brandi Gomez, DOB 01-15-1971, MRN [id number]  Patient Location:  [address] [id number]   Provider location:   [place] [place] [address] Office [phone] Fax [phone]   PCP:  Horald Pollen, MD  Cardiologist:  Skeet Latch, MD  Electrophysiologist:  Will Meredith Leeds, MD   Chief Complaint: TEE explanation  History of Present Illness:    Brandi Gomez is a 48 y.o. female who presents via audio/video  conferencing for a telehealth visit today.  Patient verified DOB and address.  The patient does not symptoms concerning for COVID-19 infection (fever, chills, cough, or new SHORTNESS OF BREATH).   Brandi Gomez has a past medical history of chronic systolic and diastolic heart failure, angioedema (lisinopril), hyperlipidemia, morbid obesity, CKD 3, OSA on CPAP, anxiety, bipolar, hypertension, epilepsy, sleep apnea, type 2 diabetes, and CVA.  She had a Linq loop recorder monitor implanted after her CVA on 05/30/2017.  The device was then removed on 02/2018 due to patient concerns and vibration of the device.  She was seen in the clinic by Dr. Curt Bears on 02/25/2018.  She denied palpitations, chest pain, shortness of breath, orthopnea, PND, lower extremity edema, dizziness, presyncope, syncope, bleeding and neurologic symptoms.  She was tolerating her medications without difficulty and wished to have her loop recorder removed at that time.  She was not noticing any fatigue or increased shortness of breath at that time.  She gave no reason for wishing to have the device removed.  Her loop recorder was then removed by Dr. Curt Bears on 03/04/2018.  She was then seen by Dr. Oval Linsey on 10/15/2019 for consult due to worsening systolic function.  An echocardiogram was completed on 10/12/2019 which showed an LVEF of 35 to 40%, mildly increased LVH and grade 2 diastolic dysfunction.  Not a candidate for ACE inhibitor/ARB or ARNI due to history of angioedema.  Her carotid Doppler showed bilateral ICA 1 to 39% stenosis.  She was scheduled for a follow-up TEE to evaluate for thrombus after recurrent CVA.  Her TEE was scheduled for 10/18/2019 however, it was  not completed.  She will also need a ischemic evaluation in the near future however due to her recent CVA we will postpone for now.  She is contacted via virtual platform today after she canceled her TEE.  She contacted the triage nurse after reading information online  about the procedure.  She states that she read information indicating that she would have to swallow something.  This made her uncomfortable because she did not know the level of sedation she would receive.  I have explained the procedure thoroughly and answered all of her questions about the TEE.  We will reschedule her TEE to evaluate for thrombus after recurrent CVA.  She denies chest pain, shortness of breath, lower extremity edema, palpitations, melena, hematuria, hemoptysis, diaphoresis, weakness, presyncope, syncope, orthopnea, and PND.   Prior CV studies:   The following studies were reviewed today:  Echo 10/12/19: IMPRESSIONS  1. Left ventricular ejection fraction, by visual estimation, is 35 to 40%. The left ventricle has moderately decreased function. There is mildly increased left ventricular hypertrophy. 2. Left ventricular diastolic parameters are consistent with Grade II diastolic dysfunction (pseudonormalization). 3. Global right ventricle has normal systolic function.The right ventricular size is normal. 4. Left atrial size was normal. 5. Right atrial size was normal. 6. The mitral valve is normal in structure. No evidence of mitral valve regurgitation. No evidence of mitral stenosis. 7. The tricuspid valve is normal in structure. Tricuspid valve regurgitation is trivial. 8. The aortic valve is normal in structure. Aortic valve regurgitation is not visualized. No evidence of aortic valve sclerosis or stenosis. 9. The pulmonic valve was not well visualized. Pulmonic valve regurgitation is not visualized. 10. Technically difficult; moderate global reduction in LV systolic function; grade 2 diastolic dysfunction; mild LVH.  Carotid Doppler: Summary: Right Carotid: Velocities in the right ICA are consistent with a 1-39% stenosis.  Left Carotid: Velocities in the left ICA are consistent with a 1-39% stenosis.  Past Medical History:  Diagnosis Date  . Anemia   .  Anxiety   . Bipolar 1 disorder ([place])   . Common migraine 05/19/2015  . Depression   . Diabetes mellitus, type II ([place])   . Hypertension   . Irritable bowel syndrome (IBS)   . Mild mental retardation   . Obesity   . Partial complex seizure disorder with intractable epilepsy ([place]) 05/12/2014  . Seizures ([place])    intractable, sz 08/23/17  . Sleep apnea   . Stroke ([place])   . Type II or unspecified type diabetes mellitus without mention of complication, not stated as uncontrolled    Past Surgical History:  Procedure Laterality Date  . COLONOSCOPY     2012-normal , Dr Sharlett Iles  . ESOPHAGOGASTRODUODENOSCOPY     normal-Dr Patterson 2012  . LOOP RECORDER INSERTION N/A 05/30/2017   Procedure: Loop Recorder Insertion;  Surgeon: Constance Haw, MD;  Location: [place];  Service: Cardiovascular;  Laterality: N/A;  . LOOP RECORDER REMOVAL N/A 03/04/2018   Procedure: LOOP RECORDER REMOVAL;  Surgeon: Constance Haw, MD;  Location: [facility];  Service: Cardiovascular;  Laterality: N/A;  . MYRINGOTOMY WITH TUBE PLACEMENT    . MYRINGOTOMY WITH TUBE PLACEMENT Right 11/05/2017   Procedure: MYRINGOTOMY WITH TUBE PLACEMENT;  Surgeon: Melissa Montane, MD;  Location: [facility];  Service: ENT;  Laterality: Right;  right T Tube placement  . NASAL SINUS SURGERY    . TEE WITHOUT CARDIOVERSION N/A 05/30/2017   Procedure: TRANSESOPHAGEAL ECHOCARDIOGRAM (TEE);  Surgeon: Acie Fredrickson,  Wonda Cheng, MD;  Location: [facility];  Service: Cardiovascular;  Laterality: N/A;     Current Meds  Medication Sig  . acetaminophen (TYLENOL) 325 MG tablet Take 2 tablets (650 mg total) by mouth every 4 (four) hours as needed for mild pain (or temp > 37.5 C (99.5 F)).  Marland Kitchen ARIPiprazole (ABILIFY) 10 MG tablet Take 1 tablet (10 mg total) by mouth daily.  Marland Kitchen aspirin EC 81 MG EC tablet Take 1 tablet (81 mg total) by mouth daily.  Marland Kitchen atorvastatin (LIPITOR) 40 MG tablet Take 1 tablet (40 mg total) by mouth daily at 6 PM.  .  carvedilol (COREG) 3.125 MG tablet Take 1 tablet (3.125 mg total) by mouth 2 (two) times daily.  . clopidogrel (PLAVIX) 75 MG tablet Take 1 tablet (75 mg total) by mouth daily for 21 days.  . hydrALAZINE (APRESOLINE) 10 MG tablet Take 1 tablet (10 mg total) by mouth 2 (two) times daily.  . isosorbide dinitrate (ISORDIL) 10 MG tablet Take 1 tablet (10 mg total) by mouth 2 (two) times daily.  Marland Kitchen lamoTRIgine (LAMICTAL) 100 MG tablet Take 1 tablet (100 mg total) by mouth 2 (two) times daily.  . methylphenidate (RITALIN) 5 MG tablet Take 1 tablet (5 mg total) by mouth 2 (two) times daily.  . polyethylene glycol (MIRALAX / GLYCOLAX) 17 g packet Take 17 g by mouth daily.  Marland Kitchen senna-docusate (SENOKOT-S) 8.6-50 MG tablet Take 1 tablet by mouth daily.  Marland Kitchen torsemide (DEMADEX) 10 MG tablet Take 1 tablet (10 mg total) by mouth daily.  . traZODone (DESYREL) 100 MG tablet Take 1 tablet (100 mg total) by mouth at bedtime.  . Vitamin D, Ergocalciferol, (DRISDOL) 1.25 MG (50000 UT) CAPS capsule Take 1 capsule (50,000 Units total) by mouth every 7 (seven) days.     Allergies:   Amoxicillin, Lisinopril, Hydrocodone, and Tegretol [carbamazepine]   Social History   Tobacco Use  . Smoking status: Never Smoker  . Smokeless tobacco: Never Used  Substance Use Topics  . Alcohol use: No  . Drug use: No     Family Hx: The patient's family history includes Bipolar disorder in her father; Diabetes in her daughter and mother; Hypertension in her mother; Leukemia in her daughter; Ovarian cancer in her maternal aunt.  ROS:   Please see the history of present illness.     All other systems reviewed and are negative.   Labs/Other Tests and Data Reviewed:    Recent Labs: 05/14/2019: B Natriuretic Peptide 22.4 10/13/2019: Magnesium 1.8 10/18/2019: ALT 13 11/01/2019: BUN 19; Creatinine, Ser 1.45; Hemoglobin 9.3; Platelets 301; Potassium 4.7; Sodium 139   Recent Lipid Panel Lab Results  Component Value Date/Time   CHOL  123 10/12/2019 03:19 AM   TRIG 49 10/12/2019 03:19 AM   HDL 49 10/12/2019 03:19 AM   CHOLHDL 2.5 10/12/2019 03:19 AM   LDLCALC 64 10/12/2019 03:19 AM    Wt Readings from Last 3 Encounters:  11/01/19 281 lb (127.5 kg)  10/29/19 282 lb (127.9 kg)  10/20/19 260 lb 2.3 oz (118 kg)     Exam:    Vital Signs:  BP (!) 134/93   Pulse 74    Well nourished, well developed female in no  acute distress.   ASSESSMENT & PLAN:      Chronic systolic and diastolic heart [id number] echocardiogram showed reduced LVEF from 40 -45% to 35 - 40%.  Her beta-blocker was held during her recent admission due to bradycardia.  She has a history  of angioedema with lisinopril so no ARB or ARNI.  Hospital course  added low-dose hydralazine/nitrates. Continue hydralazine 10 mg twice daily Continue isosorbide mononitrate 10 mg twice daily Order BMP and CBC Continue carvedilol 3.125 twice daily Plan for future ischemic evaluation Heart healthy low-sodium diet-salty 6 given Increase physical activity as tolerated Daily weights-heart failure action plan given.  Recurrent CVA-no atrial fibrillation/PFO noted with prior stroke.  Loop recorder removed by EP 03/04/2018 due to patient discomfort. Continue aspirin 81 Continue clopidogrel 75 mg daily Reorder TEE to assess for thrombus  Carotid stenosis-carotid Doppler showed bilateral mild 1-39% ICA stenosis. Continue aspirin Continue clopidogrel Continue atorvastatin 40 mg daily Heart healthy low-sodium diet high-fiber  COVID-19 Education: The signs and symptoms of COVID-19 were discussed with the patient and how to seek care for testing (follow up with PCP or arrange E-visit).  The importance of social distancing was discussed today.  Patient Risk:   After full review of this patients clinical status, I feel that they are at least moderate risk at this time.  Time:   Today, I have spent 10 minutes with the patient with telehealth technology discussing  TEE procedure and benefits of proceeding with the evaluation.     Medication Adjustments/Labs and Tests Ordered: Current medicines are reviewed at length with the patient today.  Concerns regarding medicines are outlined above.   Tests Ordered: Orders Placed This Encounter  Procedures  . BMET  . CBC   Medication Changes: No orders of the defined types were placed in this encounter.   Disposition: Follow-up with me in 1 month(s)  Signed,  Jossie Ng. [facility] [address] Office [phone] Fax [phone]

## 2019-11-08 NOTE — Patient Instructions (Addendum)
  Dear Brandi Gomez. Brandi Gomez  You are scheduled for a TEE on 11-15-2019 with Dr. Meda Gomez.  Please arrive at the [facility] (Main Entrance A) at [facility]: [address] [place], [place] [address] at 2PM pm. (1 hour prior to procedure unless lab work is needed; if lab work is needed arrive 1.5 hours ahead)  [place] ON 11-11-2019 @215PM   AT [place] ([place]) [place]. MAKE SURE TO HAVE COVID TEST DONE IN THE CAR UNDER THE OVERHANG, NOT IN THE TENT. HAVE LABWORK DONE BEFORE. YOU MUST SELF QUARANTINE FROM THE TIME YOU LEAVE THE TESTING SITE UNTIL YOU LEAVE FOR THE TEE TEST.  DIET: Nothing to eat or drink after midnight except a sip of water with medications (see medication instructions below)  Medication Instructions:  Continue your anticoagulant: PLAVIX AND ASPIRIN 81MG  You will need to continue your anticoagulant after your procedure until you are told by your Provider that it is safe to stop.  Labs:   HAVE YOUR LAB DONE BEFORE COVID TESTING ON 11-11-2019  You must have a responsible person to drive you home and stay in the waiting area during your procedure. Failure to do so could result in cancellation.  Bring your insurance cards.  *Special Note: Every effort is made to have your procedure done on time. Occasionally there are emergencies that occur at the hospital that may cause delays. Please be patient if a delay does occur.

## 2019-11-08 NOTE — Telephone Encounter (Signed)
Is patient going to reschedule this?

## 2019-11-08 NOTE — Telephone Encounter (Signed)
New message:    Patient calling stating that some one was to call her to schedule a ECHO TEE. Please call patient back.

## 2019-11-08 NOTE — Telephone Encounter (Signed)
Pt is scheduled for TEE 11-15-2019 to arrive 2pm at [facility] before COVID testing this week  COVID testing 11-11-2019 arrive no later than 215pm. Pt and friend Gwyneth Revels verbalized understanding.

## 2019-11-08 NOTE — Telephone Encounter (Signed)
Spoke with pt she states that she "got scared" reading about the TEE online I have made a Vir appt to discuss

## 2019-11-09 ENCOUNTER — Ambulatory Visit: Payer: Medicare HMO | Admitting: Neurology

## 2019-11-09 ENCOUNTER — Ambulatory Visit (HOSPITAL_COMMUNITY): Admission: RE | Admit: 2019-11-09 | Payer: Medicare HMO | Source: Home / Self Care | Admitting: Cardiovascular Disease

## 2019-11-09 ENCOUNTER — Encounter (HOSPITAL_COMMUNITY): Admission: RE | Payer: Self-pay | Source: Home / Self Care

## 2019-11-09 SURGERY — ECHOCARDIOGRAM, TRANSESOPHAGEAL
Anesthesia: Moderate Sedation

## 2019-11-10 ENCOUNTER — Telehealth: Payer: Self-pay | Admitting: *Deleted

## 2019-11-10 DIAGNOSIS — I639 Cerebral infarction, unspecified: Secondary | ICD-10-CM | POA: Diagnosis not present

## 2019-11-10 LAB — CBC
Hematocrit: 30 % — ABNORMAL LOW (ref 34.0–46.6)
Hemoglobin: 9.7 g/dL — ABNORMAL LOW (ref 11.1–15.9)
MCH: 28.4 pg (ref 26.6–33.0)
MCHC: 32.3 g/dL (ref 31.5–35.7)
MCV: 88 fL (ref 79–97)
Platelets: 340 10*3/uL (ref 150–450)
RBC: 3.42 x10E6/uL — ABNORMAL LOW (ref 3.77–5.28)
RDW: 13.9 % (ref 11.7–15.4)
WBC: 7.1 10*3/uL (ref 3.4–10.8)

## 2019-11-10 LAB — BASIC METABOLIC PANEL
BUN/Creatinine Ratio: 13 (ref 9–23)
BUN: 16 mg/dL (ref 6–24)
CO2: 23 mmol/L (ref 20–29)
Calcium: 8.7 mg/dL (ref 8.7–10.2)
Chloride: 103 mmol/L (ref 96–106)
Creatinine, Ser: 1.24 mg/dL — ABNORMAL HIGH (ref 0.57–1.00)
GFR calc Af Amer: 59 mL/min/{1.73_m2} — ABNORMAL LOW (ref >59)
GFR calc non Af Amer: 51 mL/min/{1.73_m2} — ABNORMAL LOW (ref >59)
Glucose: 144 mg/dL — ABNORMAL HIGH (ref 65–99)
Potassium: 4.3 mmol/L (ref 3.5–5.2)
Sodium: 139 mmol/L (ref 134–144)

## 2019-11-10 NOTE — Telephone Encounter (Signed)
Faxed signed orders to [facility] Attn: [employer]. Confirmation page 5:20 pm.

## 2019-11-11 ENCOUNTER — Other Ambulatory Visit (HOSPITAL_COMMUNITY)
Admission: RE | Admit: 2019-11-11 | Discharge: 2019-11-11 | Disposition: A | Discharge status: Home / Self Care | Payer: Medicare HMO | Source: Ambulatory Visit | Attending: Cardiovascular Disease | Admitting: Cardiovascular Disease

## 2019-11-11 DIAGNOSIS — Z01812 Encounter for preprocedural laboratory examination: Secondary | ICD-10-CM | POA: Diagnosis not present

## 2019-11-11 DIAGNOSIS — Z20828 Contact with and (suspected) exposure to other viral communicable diseases: Secondary | ICD-10-CM | POA: Diagnosis not present

## 2019-11-11 LAB — SARS CORONAVIRUS 2 (TAT 6-24 HRS): SARS Coronavirus 2: NEGATIVE

## 2019-11-12 ENCOUNTER — Ambulatory Visit (INDEPENDENT_AMBULATORY_CARE_PROVIDER_SITE_OTHER): Payer: Medicare HMO | Admitting: Psychiatry

## 2019-11-12 ENCOUNTER — Other Ambulatory Visit: Payer: Self-pay

## 2019-11-12 DIAGNOSIS — F0630 Mood disorder due to known physiological condition, unspecified: Secondary | ICD-10-CM

## 2019-11-12 DIAGNOSIS — I69398 Other sequelae of cerebral infarction: Secondary | ICD-10-CM

## 2019-11-12 MED ORDER — ARIPIPRAZOLE 5 MG PO TABS: 5.0000 mg | ORAL_TABLET | Freq: Every day | ORAL | 0 refills | Status: DC

## 2019-11-12 NOTE — Progress Notes (Signed)
Psychiatric Initial Adult Assessment   Patient Identification: Brandi Gomez MRN:  [id number] Date of Evaluation:  11/12/2019 Referral Source: Dr. Tory Emerald Chief Complaint:  Suspiciousness/paranoia Visit Diagnosis: Mood disorder secondary to CVA  Today the patient was interviewed by the phone.  Unfortunately 2 weeks ago she had a repeat ischemic CVA.  It affected the left side of her brain and there for the right side of her body.  She is still recovering from right-sided weakness.  The patient complains mainly of a problem with memory.  She also describes some degree of fatigue.  She saw a rehab doctor who began her on Ritalin.  She has not taken it yet.  She denies being persistently depressed.  Importantly she is not as impulsive at all.  Today we spoke to her caregiver [name] who says she is no longer attempting to elope.  And is not just because she can walk but because she really does not want to go away.  She is now more content to staying with him.  The patient denies being depressed and [name] agrees.  She does not appear anxious at all.  He actually is speaking clearer.  She seems rational.  She is eating well has no evidence of psychosis.  One of her complaints besides a problem with memory is that she feels like she is overeating.  [name] agrees.  At this time our treatment was for mainly her impulsivity.  Note is that she is taking Lamictal for a seizure disorder. Depression Symptoms:  fatigue, (Hypo) Manic Symptoms:   Anxiety Symptoms:   Psychotic Symptoms:   PTSD Symptoms:   Past Psychiatric History: Cymbalta 30 mg Previous Psychotropic Medications: Cymbalta 30 mg  Substance Abuse History in the last 12 months:  No.  Consequences of Substance Abuse:   Past Medical History:  Past Medical History:  Diagnosis Date  . Anemia   . Anxiety   . Bipolar 1 disorder ([place])   . Common migraine 05/19/2015  . Depression   . Diabetes mellitus, type II ([place])   . Hypertension   .  Irritable bowel syndrome (IBS)   . Mild mental retardation   . Obesity   . Partial complex seizure disorder with intractable epilepsy ([place]) 05/12/2014  . Seizures ([place])    intractable, sz 08/23/17  . Sleep apnea   . Stroke ([place])   . Type II or unspecified type diabetes mellitus without mention of complication, not stated as uncontrolled     Past Surgical History:  Procedure Laterality Date  . COLONOSCOPY     2012-normal , Dr Sharlett Iles  . ESOPHAGOGASTRODUODENOSCOPY     normal-Dr Patterson 2012  . LOOP RECORDER INSERTION N/A 05/30/2017   Procedure: Loop Recorder Insertion;  Surgeon: Constance Haw, MD;  Location: [facility];  Service: Cardiovascular;  Laterality: N/A;  . LOOP RECORDER REMOVAL N/A 03/04/2018   Procedure: LOOP RECORDER REMOVAL;  Surgeon: Constance Haw, MD;  Location: [facility];  Service: Cardiovascular;  Laterality: N/A;  . MYRINGOTOMY WITH TUBE PLACEMENT    . MYRINGOTOMY WITH TUBE PLACEMENT Right 11/05/2017   Procedure: MYRINGOTOMY WITH TUBE PLACEMENT;  Surgeon: Melissa Montane, MD;  Location: [facility];  Service: ENT;  Laterality: Right;  right T Tube placement  . NASAL SINUS SURGERY    . TEE WITHOUT CARDIOVERSION N/A 05/30/2017   Procedure: TRANSESOPHAGEAL ECHOCARDIOGRAM (TEE);  Surgeon: Acie Fredrickson Wonda Cheng, MD;  Location: [facility];  Service: Cardiovascular;  Laterality: N/A;    Family Psychiatric  History:   Family History:  Family History  Problem Relation Age of Onset  . Diabetes Mother        passed away from accidental death  . Hypertension Mother   . Bipolar disorder Father   . Diabetes Daughter   . Leukemia Daughter   . Ovarian cancer Maternal Aunt     Social History:   Social History   Socioeconomic History  . Marital status: Single    Spouse name: [name]  . Number of children: 3  . Years of education: 33  . Highest education level: Not on file  Occupational History  . Occupation: disabled    Employer: DISABLED  Social  Needs  . Financial resource strain: Not on file  . Food insecurity    Worry: Never true    Inability: Never true  . Transportation needs    Medical: No    Non-medical: No  Tobacco Use  . Smoking status: Never Smoker  . Smokeless tobacco: Never Used  Substance and Sexual Activity  . Alcohol use: No  . Drug use: No  . Sexual activity: Never  Lifestyle  . Physical activity    Days per week: Not on file    Minutes per session: Not on file  . Stress: Not on file  Relationships  . Social Herbalist on phone: Not on file    Gets together: Not on file    Attends religious service: Not on file    Active member of club or organization: Not on file    Attends meetings of clubs or organizations: Not on file    Relationship status: Not on file  Other Topics Concern  . Not on file  Social History Narrative   Patient lives at home with daughter.    Patient has 3 children.    Patient is right handed.    Patient has a high school education.    Patient is on disability   Patient drinks 2 cups of caffeine daily.    Additional Social History:   Allergies:   Allergies  Allergen Reactions  . Amoxicillin Itching    Did it involve swelling of the face/tongue/throat, SOB, or low BP? No Did it involve sudden or severe rash/hives, skin peeling, or any reaction on the inside of your mouth or nose? No Did you need to seek medical attention at a hospital or doctor's office? No When did it last happen?within the past 10 years If all above answers are "NO", may proceed with cephalosporin use.   Marland Kitchen Lisinopril Swelling    Angioedema   . Hydrocodone Other (See Comments)    Depressed   . Tegretol [Carbamazepine] Swelling    Throat swells    Metabolic Disorder Labs: Lab Results  Component Value Date   HGBA1C 6.5 (H) 10/12/2019   MPG 139.85 10/12/2019   MPG 163 11/05/2017   No results found for: PROLACTIN Lab Results  Component Value Date   CHOL 123 10/12/2019   TRIG 49  10/12/2019   HDL 49 10/12/2019   CHOLHDL 2.5 10/12/2019   VLDL 10 10/12/2019   LDLCALC 64 10/12/2019   LDLCALC 82 05/21/2017     Current Medications: Current Outpatient Medications  Medication Sig Dispense Refill  . acetaminophen (TYLENOL) 325 MG tablet Take 2 tablets (650 mg total) by mouth every 4 (four) hours as needed for mild pain (or temp > 37.5 C (99.5 F)). (Patient taking differently: Take 325 mg by mouth every 4 (four) hours as  needed for mild pain (or temp > 37.5 C (99.5 F)). )    . ARIPiprazole (ABILIFY) 5 MG tablet Take 1 tablet (5 mg total) by mouth daily. 14 tablet 0  . aspirin EC 81 MG EC tablet Take 1 tablet (81 mg total) by mouth daily. (Patient not taking: Reported on 11/08/2019)    . atorvastatin (LIPITOR) 40 MG tablet Take 1 tablet (40 mg total) by mouth daily at 6 PM. 30 tablet 0  . carvedilol (COREG) 3.125 MG tablet Take 1 tablet (3.125 mg total) by mouth 2 (two) times daily. 180 tablet 3  . hydrALAZINE (APRESOLINE) 10 MG tablet Take 1 tablet (10 mg total) by mouth 2 (two) times daily. 60 tablet 1  . isosorbide dinitrate (ISORDIL) 10 MG tablet Take 1 tablet (10 mg total) by mouth 2 (two) times daily. 60 tablet 0  . lamoTRIgine (LAMICTAL) 100 MG tablet Take 1 tablet (100 mg total) by mouth 2 (two) times daily. 60 tablet 1  . methylphenidate (RITALIN) 5 MG tablet Take 1 tablet (5 mg total) by mouth 2 (two) times daily. 30 tablet 0  . polyethylene glycol (MIRALAX / GLYCOLAX) 17 g packet Take 17 g by mouth daily. (Patient not taking: Reported on 11/08/2019) 14 each 0  . senna-docusate (SENOKOT-S) 8.6-50 MG tablet Take 1 tablet by mouth daily. (Patient taking differently: Take 1 tablet by mouth daily as needed for mild constipation. ) 30 tablet 0  . torsemide (DEMADEX) 10 MG tablet Take 1 tablet (10 mg total) by mouth daily. (Patient taking differently: Take 10 mg by mouth daily as needed (swelling). ) 30 tablet 0  . traZODone (DESYREL) 100 MG tablet Take 1 tablet (100 mg  total) by mouth at bedtime. 30 tablet 0  . Vitamin D, Ergocalciferol, (DRISDOL) 1.25 MG (50000 UT) CAPS capsule Take 1 capsule (50,000 Units total) by mouth every 7 (seven) days. (Patient taking differently: Take 50,000 Units by mouth every Friday. ) 5 capsule 0   No current facility-administered medications for this visit.     Neurologic: Headache: No Seizure: Yes Paresthesias:No  Musculoskeletal: Strength & Muscle Tone: within normal limits Gait & Station: normal Patient leans: Backward and N/A  Psychiatric Specialty Exam: ROS  There were no vitals taken for this visit.There is no height or weight on file to calculate BMI.  General Appearance: Casual  Eye Contact:  Fair  Speech:  Slurred  Volume:  Normal  Mood:  Dysphoric  Affect:  Appropriate  Thought Process:  Goal Directed  Orientation:  Full (Time, Place, and Person)  Thought Content:  Logical  Suicidal Thoughts:  No  Homicidal Thoughts:  No  Memory:  Negative  Judgement:  Fair  Insight:  Lacking  Psychomotor Activity:  Decreased  Concentration:    Recall:  [place] of Knowledge:Fair  Language: Fair  Akathisia:  No  Handed:    AIMS (if indicated):    Assets:  Desire for Improvement  ADL's:  Intact  Cognition: WNL  Sleep:      Treatment Plan Summary: 12/4/202011:55 AM   This patient is #1 problem would be described as likely either dementia with a disturbance of mood/conduct or a CVA producing depression.  Nonetheless at this time the patient is doing better psychiatrically.  She is much less impulsive.  She still alert very lucid and is rational.  Her mood is stable.  She drinks no alcohol uses no drugs.  Given the fact that she has had a stroke and that she  is 48 years of age although she does not show overt evidence of tardive dyskinesia at this time we will go ahead and taper her off of her Abilify.  She reduced to 5 mg for 2 weeks and then discontinue it.  She is taking no other medications for me at this  time.  Patient return to see me perhaps for her last visit in 2-1/2 months.  Her significant other [name] knows to call us if there are any problems.  The patient is not suicidal.  She is functioning fairly well with assistance.

## 2019-11-12 NOTE — Addendum Note (Signed)
Addended by: Deberah Pelton on: 11/12/2019 01:09 PM   Modules accepted: Orders, SmartSet

## 2019-11-12 NOTE — Addendum Note (Signed)
Addended by: Deberah Pelton on: 11/12/2019 12:53 PM   Modules accepted: Miquel Dunn

## 2019-11-15 ENCOUNTER — Encounter (HOSPITAL_COMMUNITY): Payer: Self-pay | Admitting: *Deleted

## 2019-11-15 ENCOUNTER — Encounter (HOSPITAL_COMMUNITY)
Admission: RE | Disposition: A | Discharge status: Home / Self Care | Payer: Medicare HMO | Source: Home / Self Care | Attending: Cardiology

## 2019-11-15 ENCOUNTER — Ambulatory Visit (HOSPITAL_COMMUNITY): Payer: Medicare HMO | Admitting: Certified Registered Nurse Anesthetist

## 2019-11-15 ENCOUNTER — Ambulatory Visit (HOSPITAL_COMMUNITY)
Admission: RE | Admit: 2019-11-15 | Discharge: 2019-11-15 | Disposition: A | Discharge status: Home / Self Care | Payer: Medicare HMO | Attending: Cardiology | Admitting: Cardiology

## 2019-11-15 ENCOUNTER — Other Ambulatory Visit: Payer: Self-pay

## 2019-11-15 ENCOUNTER — Ambulatory Visit (HOSPITAL_BASED_OUTPATIENT_CLINIC_OR_DEPARTMENT_OTHER): Payer: Medicare HMO

## 2019-11-15 ENCOUNTER — Other Ambulatory Visit (HOSPITAL_COMMUNITY): Payer: Self-pay | Admitting: *Deleted

## 2019-11-15 DIAGNOSIS — F419 Anxiety disorder, unspecified: Secondary | ICD-10-CM | POA: Insufficient documentation

## 2019-11-15 DIAGNOSIS — I081 Rheumatic disorders of both mitral and tricuspid valves: Secondary | ICD-10-CM | POA: Diagnosis not present

## 2019-11-15 DIAGNOSIS — N183 Chronic kidney disease, stage 3 unspecified: Secondary | ICD-10-CM | POA: Insufficient documentation

## 2019-11-15 DIAGNOSIS — Z880 Allergy status to penicillin: Secondary | ICD-10-CM | POA: Insufficient documentation

## 2019-11-15 DIAGNOSIS — I5042 Chronic combined systolic (congestive) and diastolic (congestive) heart failure: Secondary | ICD-10-CM | POA: Diagnosis not present

## 2019-11-15 DIAGNOSIS — E119 Type 2 diabetes mellitus without complications: Secondary | ICD-10-CM | POA: Diagnosis not present

## 2019-11-15 DIAGNOSIS — I10 Essential (primary) hypertension: Secondary | ICD-10-CM

## 2019-11-15 DIAGNOSIS — I130 Hypertensive heart and chronic kidney disease with heart failure and stage 1 through stage 4 chronic kidney disease, or unspecified chronic kidney disease: Secondary | ICD-10-CM | POA: Insufficient documentation

## 2019-11-15 DIAGNOSIS — Z79899 Other long term (current) drug therapy: Secondary | ICD-10-CM | POA: Insufficient documentation

## 2019-11-15 DIAGNOSIS — R569 Unspecified convulsions: Secondary | ICD-10-CM | POA: Diagnosis not present

## 2019-11-15 DIAGNOSIS — F70 Mild intellectual disabilities: Secondary | ICD-10-CM | POA: Insufficient documentation

## 2019-11-15 DIAGNOSIS — G40909 Epilepsy, unspecified, not intractable, without status epilepticus: Secondary | ICD-10-CM | POA: Insufficient documentation

## 2019-11-15 DIAGNOSIS — M545 Low back pain: Secondary | ICD-10-CM | POA: Diagnosis not present

## 2019-11-15 DIAGNOSIS — F319 Bipolar disorder, unspecified: Secondary | ICD-10-CM | POA: Insufficient documentation

## 2019-11-15 DIAGNOSIS — K589 Irritable bowel syndrome without diarrhea: Secondary | ICD-10-CM | POA: Diagnosis not present

## 2019-11-15 DIAGNOSIS — G4733 Obstructive sleep apnea (adult) (pediatric): Secondary | ICD-10-CM | POA: Diagnosis not present

## 2019-11-15 DIAGNOSIS — I340 Nonrheumatic mitral (valve) insufficiency: Secondary | ICD-10-CM | POA: Diagnosis not present

## 2019-11-15 DIAGNOSIS — Z888 Allergy status to other drugs, medicaments and biological substances status: Secondary | ICD-10-CM | POA: Diagnosis not present

## 2019-11-15 DIAGNOSIS — Z885 Allergy status to narcotic agent status: Secondary | ICD-10-CM | POA: Diagnosis not present

## 2019-11-15 DIAGNOSIS — E1122 Type 2 diabetes mellitus with diabetic chronic kidney disease: Secondary | ICD-10-CM | POA: Diagnosis not present

## 2019-11-15 DIAGNOSIS — Z7902 Long term (current) use of antithrombotics/antiplatelets: Secondary | ICD-10-CM | POA: Insufficient documentation

## 2019-11-15 DIAGNOSIS — E785 Hyperlipidemia, unspecified: Secondary | ICD-10-CM | POA: Diagnosis not present

## 2019-11-15 DIAGNOSIS — Z7982 Long term (current) use of aspirin: Secondary | ICD-10-CM | POA: Insufficient documentation

## 2019-11-15 DIAGNOSIS — I6523 Occlusion and stenosis of bilateral carotid arteries: Secondary | ICD-10-CM | POA: Diagnosis not present

## 2019-11-15 DIAGNOSIS — I639 Cerebral infarction, unspecified: Secondary | ICD-10-CM | POA: Diagnosis not present

## 2019-11-15 DIAGNOSIS — Z6841 Body Mass Index (BMI) 40.0 and over, adult: Secondary | ICD-10-CM | POA: Diagnosis not present

## 2019-11-15 HISTORY — PX: TEE WITHOUT CARDIOVERSION: SHX5443

## 2019-11-15 HISTORY — PX: BUBBLE STUDY: SHX6837

## 2019-11-15 SURGERY — ECHOCARDIOGRAM, TRANSESOPHAGEAL
Anesthesia: Monitor Anesthesia Care

## 2019-11-15 MED ORDER — SODIUM CHLORIDE 0.9 % IV SOLN
INTRAVENOUS | Status: DC
  Administered 2019-11-15: 13:00:00 via INTRAVENOUS

## 2019-11-15 MED ORDER — SODIUM CHLORIDE 0.9 % IV SOLN
INTRAVENOUS | Status: DC | PRN
  Administered 2019-11-15: 13:00:00 via INTRAVENOUS

## 2019-11-15 MED ORDER — PROPOFOL 10 MG/ML IV BOLUS
INTRAVENOUS | Status: DC | PRN
  Administered 2019-11-15 (×2): 10 mg via INTRAVENOUS

## 2019-11-15 MED ORDER — PROPOFOL 500 MG/50ML IV EMUL
INTRAVENOUS | Status: DC | PRN
  Administered 2019-11-15: 100 ug/kg/min via INTRAVENOUS

## 2019-11-15 NOTE — Discharge Instructions (Signed)

## 2019-11-15 NOTE — Anesthesia Postprocedure Evaluation (Signed)
Anesthesia Post Note  Patient: Brandi Gomez  Procedure(s) Performed: TRANSESOPHAGEAL ECHOCARDIOGRAM (TEE) (N/A ) BUBBLE STUDY     Patient location during evaluation: Endoscopy Anesthesia Type: MAC Level of consciousness: awake and alert Pain management: pain level controlled Vital Signs Assessment: post-procedure vital signs reviewed and stable Respiratory status: spontaneous breathing, nonlabored ventilation, respiratory function stable and patient connected to nasal cannula oxygen Cardiovascular status: stable and blood pressure returned to baseline Postop Assessment: no apparent nausea or vomiting Anesthetic complications: no    Last Vitals:  Vitals:   11/15/19 1340 11/15/19 1350  BP: 108/65 110/64  Pulse: 60 (!) 56  Resp: 11 17  Temp:    SpO2: 96% 99%    Last Pain:  Vitals:   11/15/19 1350  TempSrc:   PainSc: 0-No pain                 Catalina Gravel

## 2019-11-15 NOTE — Interval H&P Note (Signed)
History and Physical Interval Note:  11/15/2019 1:03 PM  Brandi Gomez  has presented today for surgery, with the diagnosis of stroke.  The various methods of treatment have been discussed with the patient and family. After consideration of risks, benefits and other options for treatment, the patient has consented to  Procedure(s): TRANSESOPHAGEAL ECHOCARDIOGRAM (TEE) (N/A) as a surgical intervention.  The patient's history has been reviewed, patient examined, no change in status, stable for surgery.  I have reviewed the patient's chart and labs.  Questions were answered to the patient's satisfaction.     [name]

## 2019-11-15 NOTE — CV Procedure (Signed)
     Transesophageal Echocardiogram Note  Brandi Gomez [id number] 07/05/1971  Procedure: Transesophageal Echocardiogram Indications: atrial fibrillation  Procedure Details Consent: Obtained Time Out: Verified patient identification, verified procedure, site/side was marked, verified correct patient position, special equipment/implants available, Radiology Safety Procedures followed,  medications/allergies/relevent history reviewed, required imaging and test results available.  Performed  Medications: IV propofol administered by anesthesia staff for sedation  No intracardiac source of embolism was identified, no PFO or ASD  Complications: No apparent complications Patient did tolerate procedure well.  Brandi Dawley, MD, [facility] 11/15/2019, 1:28 PM

## 2019-11-15 NOTE — Transfer of Care (Signed)
Immediate Anesthesia Transfer of Care Note  Patient: Brandi Gomez  Procedure(s) Performed: TRANSESOPHAGEAL ECHOCARDIOGRAM (TEE) (N/A ) BUBBLE STUDY  Patient Location: Endoscopy Unit  Anesthesia Type:MAC  Level of Consciousness: awake, alert  and oriented  Airway & Oxygen Therapy: Patient Spontanous Breathing and Patient connected to nasal cannula oxygen  Post-op Assessment: Report given to RN, Post -op Vital signs reviewed and stable and Patient moving all extremities X 4  Post vital signs: Reviewed and stable  Last Vitals:  Vitals Value Taken Time  BP 102/65 11/15/19 1330  Temp    Pulse 84 11/15/19 1331  Resp 16 11/15/19 1331  SpO2 94 % 11/15/19 1331  Vitals shown include unvalidated device data.  Last Pain:  Vitals:   11/15/19 1330  TempSrc:   PainSc: 0-No pain         Complications: No apparent anesthesia complications

## 2019-11-15 NOTE — Anesthesia Procedure Notes (Signed)
Procedure Name: MAC Date/Time: 11/15/2019 1:10 PM Performed by: Harden Mo, CRNA Pre-anesthesia Checklist: Patient identified, Emergency Drugs available, Suction available and Patient being monitored Patient Re-evaluated:Patient Re-evaluated prior to induction Oxygen Delivery Method: Nasal cannula Preoxygenation: Pre-oxygenation with 100% oxygen Induction Type: IV induction Placement Confirmation: breath sounds checked- equal and bilateral and positive ETCO2 Dental Injury: Teeth and Oropharynx as per pre-operative assessment

## 2019-11-15 NOTE — Progress Notes (Signed)
  Echocardiogram Echocardiogram Transesophageal has been performed.  Bobbye Charleston 11/15/2019, 1:33 PM

## 2019-11-15 NOTE — Anesthesia Preprocedure Evaluation (Signed)
Anesthesia Evaluation  Patient identified by MRN, date of birth, ID band Patient awake    Reviewed: Allergy & Precautions, NPO status , Patient's Chart, lab work & pertinent test results  Airway Mallampati: III  TM Distance: >3 FB Neck ROM: Full    Dental  (+) Teeth Intact, Dental Advisory Given   Pulmonary sleep apnea and Continuous Positive Airway Pressure Ventilation ,    Pulmonary exam normal breath sounds clear to auscultation       Cardiovascular hypertension, Normal cardiovascular exam Rhythm:Regular Rate:Normal     Neuro/Psych  Headaches, Seizures -,  PSYCHIATRIC DISORDERS Anxiety Depression Bipolar Disorder CVA    GI/Hepatic   Endo/Other  diabetesMorbid obesity  Renal/GU Renal InsufficiencyRenal disease     Musculoskeletal   Abdominal   Peds  Hematology  (+) Blood dyscrasia, anemia ,   Anesthesia Other Findings   Reproductive/Obstetrics                             Anesthesia Physical Anesthesia Plan  ASA: III  Anesthesia Plan: MAC   Post-op Pain Management:    Induction: Intravenous  PONV Risk Score and Plan: 2 and Propofol infusion and Treatment may vary due to age or medical condition  Airway Management Planned: Nasal Cannula and Natural Airway  Additional Equipment:   Intra-op Plan:   Post-operative Plan:   Informed Consent: I have reviewed the patients History and Physical, chart, labs and discussed the procedure including the risks, benefits and alternatives for the proposed anesthesia with the patient or authorized representative who has indicated his/her understanding and acceptance.     Dental advisory given  Plan Discussed with: CRNA and Anesthesiologist  Anesthesia Plan Comments:         Anesthesia Quick Evaluation

## 2019-11-19 ENCOUNTER — Ambulatory Visit (HOSPITAL_COMMUNITY): Payer: Medicare HMO | Admitting: Psychiatry

## 2019-11-20 ENCOUNTER — Other Ambulatory Visit (HOSPITAL_COMMUNITY): Payer: Self-pay | Admitting: Psychiatry

## 2019-11-23 ENCOUNTER — Other Ambulatory Visit: Payer: Self-pay | Admitting: Physical Medicine and Rehabilitation

## 2019-11-24 ENCOUNTER — Telehealth: Payer: Self-pay | Admitting: *Deleted

## 2019-11-24 NOTE — Progress Notes (Signed)
Cardiology Clinic Note   Patient Name: Brandi Gomez Date of Encounter: 11/25/2019  Primary Care Provider:  Horald Pollen, MD Primary Cardiologist:  Skeet Latch, MD  Patient Profile    Brandi Gomez 48 year old female presents to the clinic today for follow-up of her essential hypertension and combined chronic systolic, diastolic heart failure and to review her TEE.  Past Medical History    Past Medical History:  Diagnosis Date  . Anemia   . Anxiety   . Bipolar 1 disorder ([place])   . Common migraine 05/19/2015  . Depression   . Diabetes mellitus, type II ([place])   . Hypertension   . Irritable bowel syndrome (IBS)   . Mild mental retardation   . Obesity   . Partial complex seizure disorder with intractable epilepsy ([place]) 05/12/2014  . Seizures ([place])    intractable, sz 08/23/17  . Sleep apnea   . Stroke ([place])   . Type II or unspecified type diabetes mellitus without mention of complication, not stated as uncontrolled    Past Surgical History:  Procedure Laterality Date  . BUBBLE STUDY  11/15/2019   Procedure: BUBBLE STUDY;  Surgeon: Dorothy Spark, MD;  Location: [facility];  Service: Cardiovascular;;  . COLONOSCOPY     2012-normal , Dr Sharlett Iles  . ESOPHAGOGASTRODUODENOSCOPY     normal-Dr Patterson 2012  . LOOP RECORDER INSERTION N/A 05/30/2017   Procedure: Loop Recorder Insertion;  Surgeon: Constance Haw, MD;  Location: [facility];  Service: Cardiovascular;  Laterality: N/A;  . LOOP RECORDER REMOVAL N/A 03/04/2018   Procedure: LOOP RECORDER REMOVAL;  Surgeon: Constance Haw, MD;  Location: [facility];  Service: Cardiovascular;  Laterality: N/A;  . MYRINGOTOMY WITH TUBE PLACEMENT    . MYRINGOTOMY WITH TUBE PLACEMENT Right 11/05/2017   Procedure: MYRINGOTOMY WITH TUBE PLACEMENT;  Surgeon: Melissa Montane, MD;  Location: [facility];  Service: ENT;  Laterality: Right;  right T Tube placement  . NASAL SINUS SURGERY    . TEE  WITHOUT CARDIOVERSION N/A 05/30/2017   Procedure: TRANSESOPHAGEAL ECHOCARDIOGRAM (TEE);  Surgeon: Acie Fredrickson Wonda Cheng, MD;  Location: [facility];  Service: Cardiovascular;  Laterality: N/A;  . TEE WITHOUT CARDIOVERSION N/A 11/15/2019   Procedure: TRANSESOPHAGEAL ECHOCARDIOGRAM (TEE);  Surgeon: Dorothy Spark, MD;  Location: [facility];  Service: Cardiovascular;  Laterality: N/A;    Allergies  Allergies  Allergen Reactions  . Amoxicillin Itching    Did it involve swelling of the face/tongue/throat, SOB, or low BP? No Did it involve sudden or severe rash/hives, skin peeling, or any reaction on the inside of your mouth or nose? No Did you need to seek medical attention at a hospital or doctor's office? No When did it last happen?within the past 10 years If all above answers are "NO", may proceed with cephalosporin use.   Marland Kitchen Lisinopril Swelling    Angioedema   . Hydrocodone Other (See Comments)    Depressed   . Tegretol [Carbamazepine] Swelling    Throat swells    History of Present Illness    Brandi Gomez has a past medical history of chronic systolic and diastolic heart failure, angioedema (lisinopril), hyperlipidemia, morbid obesity, CKD 3, OSA on CPAP, anxiety, bipolar, hypertension, epilepsy, sleep apnea, type 2 diabetes, and CVA.  She had a Linq loop recorder monitor implanted after her CVA on 05/30/2017.  The device was then removed on 02/2018 due to patient concerns and vibration of the device.  She was seen in the  clinic by Dr. Curt Bears on 02/25/2018.  She denied palpitations, chest pain, shortness of breath, orthopnea, PND, lower extremity edema, dizziness, presyncope, syncope, bleeding and neurologic symptoms.  She was tolerating her medications without difficulty and wished to have her loop recorder removed at that time.  She was not noticing any fatigue or increased shortness of breath at that time.  She gave no reason for wishing to have the device removed.  Her loop  recorder was then removed by Dr. Curt Bears on 03/04/2018.  She was then seen by Dr. Oval Linsey on 10/15/2019 for consult due to worsening systolic function.  An echocardiogram was completed on 10/12/2019 which showed an LVEF of 35 to 40%, mildly increased LVH and grade 2 diastolic dysfunction.  Not a candidate for ACE inhibitor/ARB or ARNI due to history of angioedema.  Her carotid Doppler showed bilateral ICA 1 to 39% stenosis.  She was scheduled for a follow-up TEE to evaluate for thrombus after recurrent CVA.  Her TEE was scheduled for 10/18/2019 however, it was not completed.  She will also need a ischemic evaluation in the near future however due to her recent CVA we will postpone for now.  She presentd the clinic on 11/01/2019 and stated she felt well.  She stated she had been fairly sedentary since she had been discharged from the hospital.  She stated she did try to walk some in her house.  She also stated that she had been eating much more food and not following a low-sodium diet.  She stated that she monitored her blood pressure at home with a wrist cuff but was not sure how accurate it was due to the fluctuations in blood pressure readings she received.  She was started on low-dose carvedilol and scheduled for her follow-up TEE which showed an LVEF of 35 to 40% and no intracardiac thrombus.  She presents the clinic today and states  She denies chest pain, shortness of breath, lower extremity edema, fatigue, palpitations, melena, hematuria, hemoptysis, diaphoresis, weakness, presyncope, syncope, orthopnea, and PND.   Home Medications    Prior to Admission medications   Medication Sig Start Date End Date Taking? Authorizing Provider  acetaminophen (TYLENOL) 325 MG tablet Take 2 tablets (650 mg total) by mouth every 4 (four) hours as needed for mild pain (or temp > 37.5 C (99.5 F)). Patient taking differently: Take 325 mg by mouth every 4 (four) hours as needed for mild pain (or temp > 37.5 C  (99.5 F)).  10/19/19   Angiulli, Lavon Paganini, PA-C  ARIPiprazole (ABILIFY) 5 MG tablet Take 1 tablet (5 mg total) by mouth daily. 11/12/19   Plovsky, Berneta Sages, MD  aspirin EC 81 MG EC tablet Take 1 tablet (81 mg total) by mouth daily. Patient not taking: Reported on 11/08/2019 10/16/19   Antonieta Pert, MD  atorvastatin (LIPITOR) 40 MG tablet Take 1 tablet (40 mg total) by mouth daily at 6 PM. 10/19/19   Angiulli, Lavon Paganini, PA-C  carvedilol (COREG) 3.125 MG tablet Take 1 tablet (3.125 mg total) by mouth 2 (two) times daily. 11/01/19 01/30/20  Deberah Pelton, NP  hydrALAZINE (APRESOLINE) 10 MG tablet Take 1 tablet (10 mg total) by mouth 2 (two) times daily. 10/19/19   Angiulli, Lavon Paganini, PA-C  isosorbide dinitrate (ISORDIL) 10 MG tablet Take 1 tablet (10 mg total) by mouth 2 (two) times daily. 10/19/19   Angiulli, Lavon Paganini, PA-C  lamoTRIgine (LAMICTAL) 100 MG tablet Take 1 tablet (100 mg total) by mouth 2 (two) times  daily. 10/19/19   Angiulli, Lavon Paganini, PA-C  methylphenidate (RITALIN) 5 MG tablet Take 1 tablet (5 mg total) by mouth 2 (two) times daily. 10/29/19   Raulkar, Clide Deutscher, MD  polyethylene glycol (MIRALAX / GLYCOLAX) 17 g packet Take 17 g by mouth daily. Patient not taking: Reported on 11/08/2019 10/16/19   Antonieta Pert, MD  SENEXON-S 8.6-50 MG tablet TAKE 1 TABLET BY MOUTH EVERY DAY 11/23/19   Raulkar, Clide Deutscher, MD  torsemide (DEMADEX) 10 MG tablet Take 1 tablet (10 mg total) by mouth daily. Patient taking differently: Take 10 mg by mouth daily as needed (swelling).  10/19/19   Angiulli, Lavon Paganini, PA-C  traZODone (DESYREL) 100 MG tablet Take 1 tablet (100 mg total) by mouth at bedtime. 10/19/19   Angiulli, Lavon Paganini, PA-C  Vitamin D, Ergocalciferol, (DRISDOL) 1.25 MG (50000 UT) CAPS capsule Take 1 capsule (50,000 Units total) by mouth every 7 (seven) days. Patient taking differently: Take 50,000 Units by mouth every Friday.  10/19/19   Angiulli, Lavon Paganini, PA-C    Family History    Family History    Problem Relation Age of Onset  . Diabetes Mother        passed away from accidental death  . Hypertension Mother   . Bipolar disorder Father   . Diabetes Daughter   . Leukemia Daughter   . Ovarian cancer Maternal Aunt    She indicated that her mother is deceased. She indicated that her father is alive. She indicated that her brother is alive. She indicated that her maternal grandmother is deceased. She indicated that her maternal grandfather is deceased. She indicated that her paternal grandmother is deceased. She indicated that her paternal grandfather is deceased. She indicated that her daughter is alive. She indicated that the status of her maternal aunt is unknown.  Social History    Social History   Socioeconomic History  . Marital status: Single    Spouse name: [name]  . Number of children: 3  . Years of education: 20  . Highest education level: Not on file  Occupational History  . Occupation: disabled    Employer: DISABLED  Tobacco Use  . Smoking status: Never Smoker  . Smokeless tobacco: Never Used  Substance and Sexual Activity  . Alcohol use: No  . Drug use: No  . Sexual activity: Never  Other Topics Concern  . Not on file  Social History Narrative   Patient lives at home with daughter.    Patient has 3 children.    Patient is right handed.    Patient has a high school education.    Patient is on disability   Patient drinks 2 cups of caffeine daily.   Social Determinants of Health   Financial Resource Strain:   . Difficulty of Paying Living Expenses: Not on file  Food Insecurity: No Food Insecurity  . Worried About Charity fundraiser in the Last Year: Never true  . Ran Out of Food in the Last Year: Never true  Transportation Needs: No Transportation Needs  . Lack of Transportation (Medical): No  . Lack of Transportation (Non-Medical): No  Physical Activity:   . Days of Exercise per Week: Not on file  . Minutes of Exercise per Session: Not on file   Stress:   . Feeling of Stress : Not on file  Social Connections:   . Frequency of Communication with Friends and Family: Not on file  . Frequency of Social Gatherings with Friends and Family:  Not on file  . Attends Religious Services: Not on file  . Active Member of Clubs or Organizations: Not on file  . Attends Archivist Meetings: Not on file  . Marital Status: Not on file  Intimate Partner Violence:   . Fear of Current or Ex-Partner: Not on file  . Emotionally Abused: Not on file  . Physically Abused: Not on file  . Sexually Abused: Not on file     Review of Systems    General:  No chills, fever, night sweats or weight changes.  Cardiovascular:  No chest pain, dyspnea on exertion, edema, orthopnea, palpitations, paroxysmal nocturnal dyspnea. Dermatological: No rash, lesions/masses Respiratory: No cough, dyspnea Urologic: No hematuria, dysuria Abdominal:   No nausea, vomiting, diarrhea, bright red blood per rectum, melena, or hematemesis Neurologic:  No visual changes, wkns, changes in mental status. All other systems reviewed and are otherwise negative except as noted above.  Physical Exam    VS:  BP 126/82   Pulse 60   Temp (!) 97 F (36.1 C)   Ht 5\' 1"  (1.549 m)   Wt 282 lb 3.2 oz (128 kg)   SpO2 100%   BMI 53.32 kg/m  , BMI Body mass index is 53.32 kg/m. GEN: Well nourished, well developed, in no acute distress. HEENT: normal. Neck: Supple, no JVD, carotid bruits, or masses. Cardiac: RRR, no murmurs, rubs, or gallops. No clubbing, cyanosis, edema.  Radials/DP/PT 2+ and equal bilaterally.  Respiratory:  Respirations regular and unlabored, clear to auscultation bilaterally. GI: Soft, nontender, nondistended, BS + x 4. MS: no deformity or atrophy. Skin: warm and dry, no rash. Neuro:  Strength and sensation are intact. Psych: Normal affect.  Accessory Clinical Findings    ECG personally reviewed by me today- none today   EKG 10/12/2019 Sinus rhythm  66 bpm  TEE 11/15/2019 IMPRESSIONS    1. Left ventricular ejection fraction, by visual estimation, is 35 to 40%. The left ventricle has moderately decreased function. Mildly increased left ventricular size. There is no left ventricular hypertrophy.  2. Global right ventricle has normal systolic function.The right ventricular size is normal. No increase in right ventricular wall thickness.  3. Left atrial size was normal.  4. There is no thrombus in the left atrium and left atrial appendage. Normal filling and emptying velocities.  5. Right atrial size was normal.  6. The mitral valve is normal in structure. Mild mitral valve regurgitation. No evidence of mitral stenosis.  7. The tricuspid valve is normal in structure. Tricuspid valve regurgitation is mild.  8. The aortic valve is normal in structure. Aortic valve regurgitation is not visualized. No evidence of aortic valve sclerosis or stenosis.  9. The pulmonic valve was normal in structure. Pulmonic valve regurgitation is not visualized. 10. The inferior vena cava is normal in size with greater than 50% respiratory variability, suggesting right atrial pressure of 3 mmHg. 11. No PFO or ASD. Negative bubble study. 12. No source of intracardiac thrombus.  Echo 10/12/19: IMPRESSIONS  1. Left ventricular ejection fraction, by visual estimation, is 35 to 40%. The left ventricle has moderately decreased function. There is mildly increased left ventricular hypertrophy. 2. Left ventricular diastolic parameters are consistent with Grade II diastolic dysfunction (pseudonormalization). 3. Global right ventricle has normal systolic function.The right ventricular size is normal. 4. Left atrial size was normal. 5. Right atrial size was normal. 6. The mitral valve is normal in structure. No evidence of mitral valve regurgitation. No evidence of mitral stenosis.  7. The tricuspid valve is normal in structure. Tricuspid valve regurgitation is  trivial. 8. The aortic valve is normal in structure. Aortic valve regurgitation is not visualized. No evidence of aortic valve sclerosis or stenosis. 9. The pulmonic valve was not well visualized. Pulmonic valve regurgitation is not visualized. 10. Technically difficult; moderate global reduction in LV systolic function; grade 2 diastolic dysfunction; mild LVH.  Carotid Doppler: Summary: Right Carotid: Velocities in the right ICA are consistent with a 1-39% stenosis.  Left Carotid: Velocities in the left ICA are consistent with a 1-39% stenosis. Assessment & Plan   1.  Chronic systolic and diastolic heart [id number] echocardiogram showed reduced LVEF from 40 -45% to 35 - 40%.  Her beta-blocker was held during her recent admission due to bradycardia.  She has a history of angioedema with lisinopril so no ARB or ARNI.  Hospital course  added low-dose hydralazine/nitrates. Continue hydralazine 10 mg twice daily Continue isosorbide mononitrate 10 mg twice daily Continue carvedilol 3.125 twice daily Heart healthy low-sodium diet-salty 6 given Increase physical activity as tolerated Daily weights-heart failure action plan given.  Recurrent CVA-no atrial fibrillation/PFO noted with prior stroke.  Loop recorder removed by EP 03/04/2018 due to patient discomfort.  TEE showed LVEF 35 to 40% and no intracardiac thrombus Continue aspirin 81  Essential hypertension-BP today 126/82. Continue carvedilol 3.125 mg twice daily Continue hydralazine 10 mg twice daily Continue isosorbide dinitrate 10 mg tablet twice daily Heart healthy low-sodium diet Increase physical activity as tolerated-goal 20 minutes 5 days a week  Disposition: Follow-up with Dr. Oval Linsey in 3 months.  Jossie Ng. [facility] [address] Office [phone] Fax [phone]

## 2019-11-24 NOTE — Telephone Encounter (Signed)
Faxed completed plan of care Attn: Bettina Gavia at [facility]. Confirmation page 11:23 am.

## 2019-11-25 ENCOUNTER — Other Ambulatory Visit: Payer: Self-pay

## 2019-11-25 ENCOUNTER — Ambulatory Visit (INDEPENDENT_AMBULATORY_CARE_PROVIDER_SITE_OTHER): Payer: Medicare HMO | Admitting: General Practice

## 2019-11-25 ENCOUNTER — Encounter: Payer: Self-pay | Admitting: General Practice

## 2019-11-25 VITALS — BP 126/82 | HR 60 | Temp 97.0°F | Ht 61.0 in | Wt 282.2 lb

## 2019-11-25 DIAGNOSIS — I5042 Chronic combined systolic (congestive) and diastolic (congestive) heart failure: Secondary | ICD-10-CM | POA: Diagnosis not present

## 2019-11-25 DIAGNOSIS — I10 Essential (primary) hypertension: Secondary | ICD-10-CM | POA: Diagnosis not present

## 2019-11-25 DIAGNOSIS — I639 Cerebral infarction, unspecified: Secondary | ICD-10-CM

## 2019-11-25 NOTE — Patient Instructions (Signed)
Special Instructions: MAKE SURE TO TAKE YOUR AND LOG YOUR WEIGHT DAILY  PLEASE READ AND FOLLOW SALTY 6 ATTACHED  PLEASE INCREASE PHYSICAL ACTIVITY TO 20 MINUTES DAILY 5 DAYS A WEEK  Reduce your risk of getting COVID-19 With your heart disease it is especially important for people at increased risk of severe illness from COVID-19, and those who live with them, to protect themselves from getting COVID-19. The best way to protect yourself and to help reduce the spread of the virus that causes COVID-19 is to: Marland Kitchen Limit your interactions with other people as much as possible. . Take precautions to prevent getting COVID-19 when you do interact with others. If you start feeling sick and think you may have COVID-19, get in touch with your healthcare provider within 24 hours.  Follow-Up: IN 3 monthsIn Person You may see Skeet Latch, MD or one of the following Advanced Practice Providers on your designated Care Team:  Kerin Ransom, PA-C [place], [place] Coletta Memos, [place].    At [facility], you and your health needs are our priority.  As part of our continuing mission to provide you with exceptional heart care, we have created designated Provider Care Teams.  These Care Teams include your primary Cardiologist (physician) and Advanced Practice Providers (APPs -  Physician Assistants and Nurse Practitioners) who all work together to provide you with the care you need, when you need it.  Thank you for choosing [facility] at [facility]!!     Happy Holidays!!

## 2019-11-26 ENCOUNTER — Other Ambulatory Visit: Payer: Self-pay

## 2019-11-26 ENCOUNTER — Encounter: Payer: Medicare HMO | Admitting: Physical Medicine and Rehabilitation

## 2019-11-26 NOTE — Patient Outreach (Signed)
[facility]) Care Management  11/26/2019  Brandi Gomez 02-24-1971 [id number]   Telephone call to patient for follow up.  No answer.  HIPAA compliant voice message left.  Plan: RN CM will attempt patient again in the month of February.    [name], RN, MSN [facility] Care Management Coordinator Direct Line [phone] Cell [phone] Toll Free: [phone]  Fax: [phone]

## 2019-12-16 DIAGNOSIS — M545 Low back pain: Secondary | ICD-10-CM | POA: Diagnosis not present

## 2019-12-16 DIAGNOSIS — G4733 Obstructive sleep apnea (adult) (pediatric): Secondary | ICD-10-CM | POA: Diagnosis not present

## 2019-12-16 DIAGNOSIS — R569 Unspecified convulsions: Secondary | ICD-10-CM | POA: Diagnosis not present

## 2019-12-17 ENCOUNTER — Other Ambulatory Visit: Payer: Self-pay | Admitting: Neurology

## 2019-12-20 ENCOUNTER — Telehealth: Payer: Self-pay | Admitting: Neurology

## 2019-12-20 ENCOUNTER — Ambulatory Visit: Payer: Medicare HMO | Admitting: Emergency Medicine

## 2019-12-20 MED ORDER — LAMOTRIGINE 100 MG PO TABS: 100.0000 mg | ORAL_TABLET | Freq: Two times a day (BID) | ORAL | 0 refills | Status: DC

## 2019-12-20 NOTE — Telephone Encounter (Signed)
Pt called back and states she is completely out of her medication.

## 2019-12-20 NOTE — Telephone Encounter (Signed)
Pt is needing a refill on her lamoTRIgine (LAMICTAL) 100 MG tablet sent in to the [facility] on [address].  Pt states the pharmacy has sent in the request a while ago.

## 2019-12-20 NOTE — Telephone Encounter (Signed)
I LMVM for pt that she needs to make appt for future refills. Will refill for 30 days.

## 2019-12-28 ENCOUNTER — Telehealth: Payer: Self-pay | Admitting: *Deleted

## 2019-12-28 NOTE — Telephone Encounter (Signed)
Schedule awv  

## 2019-12-29 ENCOUNTER — Encounter: Payer: Self-pay | Admitting: Neurology

## 2019-12-29 ENCOUNTER — Ambulatory Visit (INDEPENDENT_AMBULATORY_CARE_PROVIDER_SITE_OTHER): Payer: Medicare HMO | Admitting: Neurology

## 2019-12-29 ENCOUNTER — Other Ambulatory Visit: Payer: Self-pay

## 2019-12-29 ENCOUNTER — Telehealth: Payer: Self-pay | Admitting: Neurology

## 2019-12-29 VITALS — BP 138/70 | HR 65 | Temp 97.0°F | Ht 61.0 in | Wt 283.6 lb

## 2019-12-29 DIAGNOSIS — G40909 Epilepsy, unspecified, not intractable, without status epilepticus: Secondary | ICD-10-CM | POA: Diagnosis not present

## 2019-12-29 DIAGNOSIS — I639 Cerebral infarction, unspecified: Secondary | ICD-10-CM | POA: Diagnosis not present

## 2019-12-29 DIAGNOSIS — Z6841 Body Mass Index (BMI) 40.0 and over, adult: Secondary | ICD-10-CM | POA: Diagnosis not present

## 2019-12-29 DIAGNOSIS — Z8673 Personal history of transient ischemic attack (TIA), and cerebral infarction without residual deficits: Secondary | ICD-10-CM

## 2019-12-29 DIAGNOSIS — I69934 Monoplegia of upper limb following unspecified cerebrovascular disease affecting left non-dominant side: Secondary | ICD-10-CM | POA: Diagnosis not present

## 2019-12-29 MED ORDER — LAMOTRIGINE 100 MG PO TABS: 100.0000 mg | ORAL_TABLET | Freq: Two times a day (BID) | ORAL | 3 refills | Status: DC

## 2019-12-29 NOTE — Progress Notes (Signed)
I have read the note, and I agree with the clinical assessment and plan.  Telissa Palmisano K Neomia Herbel   

## 2019-12-29 NOTE — Progress Notes (Signed)
PATIENT: Brandi Gomez DOB: 09-24-1971  REASON FOR VISIT: follow up HISTORY FROM: patient  HISTORY OF PRESENT ILLNESS: Today 12/29/19  Brandi Gomez is a 49 year old female with history of intractable seizure disorder. She was last seen in the office 07/14/2019, at that time she was increasing her Lamictal dose for seizure prevention.  She went to the emergency room, due to significant depression.  She went to the ER 8/17 for oral swelling, 9/19 for seizure while sleeping, was not taking proper dose of seizure medication.  She went to the ER 11/2 for weakness while walking to her apartment to get, her right leg became weak and she fell, MRI of the brain revealed acute 12 mm left ACA territory ischemic infarction.  She was started on aspirin 81 mg daily, increase atorvastatin 40 mg daily.  MRI was unremarkable, echo showed EF 35-40%, LDL 64, A1c 6.5.  She had a loop recorder placed June 2018, no A. fib was found, removed March 2019 due to patient request.  EEG was done while hospitalized, due to 2 breakthrough seizures, to have diffuse mild encephalopathy, no seizures or epileptiform discharges were seen.  She was loaded with IV Keppra.  Lamictal was increased to 100 mg twice a day.  Overnight EEG was done, evidence of epileptogenicity arising from the left and right frontal region.  It was determined by Dr. Hortense Ramal, she may be a candidate for epilepsy surgery.  She was discharged home, did not show for revisit 11/03/2019 with Dr. Jannifer Franklin.  She has been seen by Dr. Oval Linsey with cardiology for worsening systolic function.  Has not been seen by PCP for hospital follow-up.  She is here today with her significant other Brandi Gomez.  She reports she has been off most of her medications for several weeks.  She has remained on Lamictal, is taking 100 mg twice a day.  Fortunately, she has not had recurrent seizure.  She continues to use CPAP every night.  As result of the stroke, she says she has some weakness to  left hand when holding objects (no weakness to right side).  She says she completed physical therapy at her home.  She says she is feeling more depressed, taken off depression medications due to feeling better.. Discouraged she is not able to get out much, and has been eating more.   HISTORY 07/14/2019 SS:[name] is a 49 year old female with history of intractable seizure disorder.  She was taken off Keppra because of some problems with psychosis and she was placed on Vimpat.  She is followed by psychiatry. She has been in the ER May 9, May 30, and  July 17, July 22 for seizures.  She was recently switched off carbamazepine and Vimpat due to side effect.  She was started on Lamictal.  She has been to the ER 15 times since January 2020 for a multitude of complaints.  She was recently in the ER 06/30/2019 for report of seizure, she stopped taking her carbamazepine and Vimpat because she said "God told her to".  She was evaluated by psychiatry and was cleared for discharge home.  She saw her psychiatrist recently was continued on carbamazepine 200 mg twice a day, Abilify 10 mg daily.  She did have a CT scan of the brain 06/30/2019 was normal.  She is here today accompanied by her fianc Brandi Gomez.  She remains on the titration of Lamictal.  She is taking Lamictal 25 mg twice a day.  She is due to go up  on the dose in a few days.  She is tolerating the medication.  She did see a dermatologist regarding a rash to the top of her nose for 2 months, was told it is nothing serious.  She says since her last seizure she has been feeling depressed.  She says she wants to get out of the healthy living hotels.  She does not like being at home.  She denies any suicidal ideation.  She presents today for follow-up.   REVIEW OF SYSTEMS: Out of a complete 14 system review of symptoms, the patient complains only of the following symptoms, and all other reviewed systems are negative.  Seizures, stroke  ALLERGIES: Allergies    Allergen Reactions  . Amoxicillin Itching    Did it involve swelling of the face/tongue/throat, SOB, or low BP? No Did it involve sudden or severe rash/hives, skin peeling, or any reaction on the inside of your mouth or nose? No Did you need to seek medical attention at a hospital or doctor's office? No When did it last happen?within the past 10 years If all above answers are "NO", may proceed with cephalosporin use.   Marland Kitchen Lisinopril Swelling    Angioedema   . Hydrocodone Other (See Comments)    Depressed   . Tegretol [Carbamazepine] Swelling    Throat swells    HOME MEDICATIONS: Outpatient Medications Prior to Visit  Medication Sig Dispense Refill  . atorvastatin (LIPITOR) 40 MG tablet Take 1 tablet (40 mg total) by mouth daily at 6 PM. 30 tablet 0  . hydrALAZINE (APRESOLINE) 10 MG tablet Take 1 tablet (10 mg total) by mouth 2 (two) times daily. 60 tablet 1  . lamoTRIgine (LAMICTAL) 100 MG tablet Take 1 tablet (100 mg total) by mouth 2 (two) times daily. 60 tablet 0  . acetaminophen (TYLENOL) 325 MG tablet Take 2 tablets (650 mg total) by mouth every 4 (four) hours as needed for mild pain (or temp > 37.5 C (99.5 F)). (Patient not taking: Reported on 12/29/2019)    . ARIPiprazole (ABILIFY) 5 MG tablet Take 1 tablet (5 mg total) by mouth daily. (Patient not taking: Reported on 12/29/2019) 14 tablet 0  . aspirin EC 81 MG EC tablet Take 1 tablet (81 mg total) by mouth daily. (Patient not taking: Reported on 12/29/2019)    . carvedilol (COREG) 3.125 MG tablet Take 1 tablet (3.125 mg total) by mouth 2 (two) times daily. (Patient not taking: Reported on 12/29/2019) 180 tablet 3  . isosorbide dinitrate (ISORDIL) 10 MG tablet Take 1 tablet (10 mg total) by mouth 2 (two) times daily. (Patient not taking: Reported on 12/29/2019) 60 tablet 0  . methylphenidate (RITALIN) 5 MG tablet Take 1 tablet (5 mg total) by mouth 2 (two) times daily. (Patient not taking: Reported on 12/29/2019) 30 tablet 0  .  polyethylene glycol (MIRALAX / GLYCOLAX) 17 g packet Take 17 g by mouth daily. (Patient not taking: Reported on 12/29/2019) 14 each 0  . SENEXON-S 8.6-50 MG tablet TAKE 1 TABLET BY MOUTH EVERY DAY (Patient not taking: Reported on 12/29/2019) 30 tablet 0  . torsemide (DEMADEX) 10 MG tablet Take 1 tablet (10 mg total) by mouth daily. (Patient not taking: Reported on 12/29/2019) 30 tablet 0  . traZODone (DESYREL) 100 MG tablet Take 1 tablet (100 mg total) by mouth at bedtime. (Patient not taking: Reported on 12/29/2019) 30 tablet 0  . Vitamin D, Ergocalciferol, (DRISDOL) 1.25 MG (50000 UT) CAPS capsule Take 1 capsule (50,000 Units total) by  mouth every 7 (seven) days. (Patient not taking: Reported on 12/29/2019) 5 capsule 0   No facility-administered medications prior to visit.    PAST MEDICAL HISTORY: Past Medical History:  Diagnosis Date  . Anemia   . Anxiety   . Bipolar 1 disorder ([place])   . Common migraine 05/19/2015  . Depression   . Diabetes mellitus, type II ([place])   . Hypertension   . Irritable bowel syndrome (IBS)   . Mild mental retardation   . Obesity   . Partial complex seizure disorder with intractable epilepsy ([place]) 05/12/2014  . Seizures ([place])    intractable, sz 08/23/17  . Sleep apnea   . Stroke ([place])   . Type II or unspecified type diabetes mellitus without mention of complication, not stated as uncontrolled     PAST SURGICAL HISTORY: Past Surgical History:  Procedure Laterality Date  . BUBBLE STUDY  11/15/2019   Procedure: BUBBLE STUDY;  Surgeon: Dorothy Spark, MD;  Location: [facility];  Service: Cardiovascular;;  . COLONOSCOPY     2012-normal , Dr Sharlett Iles  . ESOPHAGOGASTRODUODENOSCOPY     normal-Dr Patterson 2012  . LOOP RECORDER INSERTION N/A 05/30/2017   Procedure: Loop Recorder Insertion;  Surgeon: Constance Haw, MD;  Location: [facility];  Service: Cardiovascular;  Laterality: N/A;  . LOOP RECORDER REMOVAL N/A 03/04/2018   Procedure: LOOP  RECORDER REMOVAL;  Surgeon: Constance Haw, MD;  Location: [facility];  Service: Cardiovascular;  Laterality: N/A;  . MYRINGOTOMY WITH TUBE PLACEMENT    . MYRINGOTOMY WITH TUBE PLACEMENT Right 11/05/2017   Procedure: MYRINGOTOMY WITH TUBE PLACEMENT;  Surgeon: Melissa Montane, MD;  Location: [facility];  Service: ENT;  Laterality: Right;  right T Tube placement  . NASAL SINUS SURGERY    . TEE WITHOUT CARDIOVERSION N/A 05/30/2017   Procedure: TRANSESOPHAGEAL ECHOCARDIOGRAM (TEE);  Surgeon: Acie Fredrickson Wonda Cheng, MD;  Location: [facility];  Service: Cardiovascular;  Laterality: N/A;  . TEE WITHOUT CARDIOVERSION N/A 11/15/2019   Procedure: TRANSESOPHAGEAL ECHOCARDIOGRAM (TEE);  Surgeon: Dorothy Spark, MD;  Location: [facility];  Service: Cardiovascular;  Laterality: N/A;    FAMILY HISTORY: Family History  Problem Relation Age of Onset  . Diabetes Mother        passed away from accidental death  . Hypertension Mother   . Bipolar disorder Father   . Diabetes Daughter   . Leukemia Daughter   . Ovarian cancer Maternal Aunt     SOCIAL HISTORY: Social History   Socioeconomic History  . Marital status: Single    Spouse name: Brandi Gomez  . Number of children: 3  . Years of education: 56  . Highest education level: Not on file  Occupational History  . Occupation: disabled    Employer: DISABLED  Tobacco Use  . Smoking status: Never Smoker  . Smokeless tobacco: Never Used  Substance and Sexual Activity  . Alcohol use: No  . Drug use: No  . Sexual activity: Never  Other Topics Concern  . Not on file  Social History Narrative   Patient lives at home with daughter.    Patient has 3 children.    Patient is right handed.    Patient has a high school education.    Patient is on disability   Patient drinks 2 cups of caffeine daily.   Social Determinants of Health   Financial Resource Strain:   . Difficulty of Paying Living Expenses: Not on file  Food Insecurity: No Food  Insecurity  .  Worried About Charity fundraiser in the Last Year: Never true  . Ran Out of Food in the Last Year: Never true  Transportation Needs: No Transportation Needs  . Lack of Transportation (Medical): No  . Lack of Transportation (Non-Medical): No  Physical Activity:   . Days of Exercise per Week: Not on file  . Minutes of Exercise per Session: Not on file  Stress:   . Feeling of Stress : Not on file  Social Connections:   . Frequency of Communication with Friends and Family: Not on file  . Frequency of Social Gatherings with Friends and Family: Not on file  . Attends Religious Services: Not on file  . Active Member of Clubs or Organizations: Not on file  . Attends Archivist Meetings: Not on file  . Marital Status: Not on file  Intimate Partner Violence:   . Fear of Current or Ex-Partner: Not on file  . Emotionally Abused: Not on file  . Physically Abused: Not on file  . Sexually Abused: Not on file      PHYSICAL EXAM  Vitals:   12/29/19 1436  BP: 138/70  Pulse: 65  Temp: (!) 97 F (36.1 C)  TempSrc: Oral  Weight: 283 lb 9.6 oz (128.6 kg)  Height: 5\' 1"  (1.549 m)   Body mass index is 53.59 kg/m.  Generalized: Well developed, in no acute distress   Neurological examination  Mentation: Alert oriented to time, place, history taking. Follows all commands speech and language fluent, flat affect Cranial nerve II-XII: Pupils were equal round reactive to light. Extraocular movements were full, visual field were full on confrontational test. Facial sensation and strength were normal.  Head turning and shoulder shrug were normal and symmetric. Motor: The motor testing reveals 5 over 5 strength of all 4 extremities. Good symmetric motor tone is noted throughout.  No tremor noted.  Strong grip strength bilaterally. Sensory: Sensory testing is intact to soft touch on all 4 extremities. No evidence of extinction is noted.  Coordination: Cerebellar testing reveals  good finger-nose-finger and heel-to-shin bilaterally.  Gait and station: Gait is normal. Tandem gait is normal.  Reflexes: Deep tendon reflexes are symmetric and normal bilaterally.   DIAGNOSTIC DATA (LABS, IMAGING, TESTING) - I reviewed patient records, labs, notes, testing and imaging myself where available.  Lab Results  Component Value Date   WBC 7.1 11/10/2019   HGB 9.7 (L) 11/10/2019   HCT 30.0 (L) 11/10/2019   MCV 88 11/10/2019   PLT 340 11/10/2019      Component Value Date/Time   NA 139 11/10/2019 1018   K 4.3 11/10/2019 1018   CL 103 11/10/2019 1018   CO2 23 11/10/2019 1018   GLUCOSE 144 (H) 11/10/2019 1018   GLUCOSE 129 (H) 10/18/2019 0638   BUN 16 11/10/2019 1018   CREATININE 1.24 (H) 11/10/2019 1018   CREATININE 0.72 01/30/2015 1241   CALCIUM 8.7 11/10/2019 1018   PROT 6.5 10/18/2019 0638   PROT 7.5 06/21/2019 1618   ALBUMIN 2.9 (L) 10/18/2019 0638   ALBUMIN 4.0 06/21/2019 1618   AST 15 10/18/2019 0638   ALT 13 10/18/2019 0638   ALKPHOS 69 10/18/2019 0638   BILITOT 0.3 10/18/2019 0638   BILITOT 0.3 06/21/2019 1618   GFRNONAA 51 (L) 11/10/2019 1018   GFRNONAA >89 01/30/2015 1241   GFRAA 59 (L) 11/10/2019 1018   GFRAA >89 01/30/2015 1241   Lab Results  Component Value Date   CHOL 123 10/12/2019   HDL  49 10/12/2019   LDLCALC 64 10/12/2019   TRIG 49 10/12/2019   CHOLHDL 2.5 10/12/2019   Lab Results  Component Value Date   HGBA1C 6.5 (H) 10/12/2019   Lab Results  Component Value Date   VITAMINB12 379 05/13/2014   Lab Results  Component Value Date   TSH 1.220 02/10/2018   ASSESSMENT AND PLAN 49 y.o. year old female  has a past medical history of Anemia, Anxiety, Bipolar 1 disorder ([place]), Common migraine (05/19/2015), Depression, Diabetes mellitus, type II ([place]), Hypertension, Irritable bowel syndrome (IBS), Mild mental retardation, Obesity, Partial complex seizure disorder with intractable epilepsy ([place]) (05/12/2014), Seizures ([place]), Sleep apnea,  Stroke ([place]), and Type II or unspecified type diabetes mellitus without mention of complication, not stated as uncontrolled. here with:  1. Left ACA territory small patchy infarct, embolic pattern, could be secondary to cardiomyopathy -MRI showed posterior left frontal lobe infarct left ACA territory, small vessel disease -MRA was unremarkable -Carotid Doppler bilateral ICA 1-39% stenosis, vertebral arteries antegrade  -2D echo EF 35 to 40% (following with cardiology) -LDL 64, A1c 6.5 -Was not on antithrombotic prior to admission, was treated with aspirin and Plavix for 3 weeks, now aspirin alone (she will restart aspirin 81 mg daily) -Previously had loop recorder June 2018, no A. fib found, removed March 2019 per patient request -Now taking Lipitor 40 mg daily LDL 64, goal less than 70 -Saw cardiology 11/25/2019, she was to continue hydralazine, isosorbide mononitrate, carvedilol, continue aspirin 81 mg daily  2.  History of intractable seizures, 2 breakthrough seizures while hospitalized -Has been unable to tolerate multiple AEDs (Keppra, Tegretol, Vimpat) -Now taking Lamictal 100 mg twice a day, no recurrent seizure -Overnight EEG has shown frequent spikes at bilateral frontal, excessive beta, generalized continuous slowing, patient may be a candidate for epilepsy surgery -Continue taking Lamictal, will place referral for [facility] for evaluation possibly epilepsy surgery consultation  Overall, she appears to have no deficits from her recent stroke, however she reports weakness to the left hand holding objects.  She has no residual right-sided weakness.  She has been out of her medications for several weeks, she needs to contact her specialist for refills.  She should restart taking aspirin 81 mg daily, continue Lamictal.  I sent a message to her primary doctor to try to get her in for hospital follow-up.  She needs to reach out to her psychiatrist if she feels she needs to restart her depression  medications. I discussed with her significant other, Brandi Gomez he will likely need to assist her getting her medications.  She will follow up here in 4 months or sooner if needed.  I did advise her symptoms worsen or she develops any new symptoms she should let us know. I will place a referral for [facility] for possibly epilepsy surgery.   MRI of the brain 10/12/2019 IMPRESSION: 1. 12 mm acute ischemic nonhemorrhagic posterior left frontal lobe infarct, left ACA distribution. No associated hemorrhage or mass effect. 2. Underlying mild chronic microvascular ischemic disease.  Overnight EEG 10/14/2019 IMPRESSION: This study showed evidence of epileptogenicity arising from left and right frontal region. Its possible that patient has a single focus with either left or right spread vs independent bilateral foci. Additionally, there is evidence of mild diffuse encephalopathy, nonspecific to etiology. No seizures were seen throughout the recording.  I spent 25 minutes with the patient. 50% of this time was spent discussing her plan of care.   Butler Denmark, AGNP-C, DNP 12/29/2019, 3:10 PM [facility] [address], [place], [place] [address] [phone]

## 2019-12-29 NOTE — Telephone Encounter (Signed)
Good afternoon, I am seeing Brandi Gomez in the office for hospital follow-up. She is in need of a follow-up appointment with Dr. Mitchel Honour for hospital follow-up could someone from your office contact her? Thanks

## 2019-12-29 NOTE — Patient Instructions (Addendum)
Start taking aspirin 81 mg daily, continue taking Lamictal for seizures. Please schedule an appointment with your primary doctor for hospital follow-up. Return in 4 months or sooner if needed. Need to reach out to specialists to refill medications.

## 2019-12-30 ENCOUNTER — Ambulatory Visit (HOSPITAL_COMMUNITY)
Admission: EM | Admit: 2019-12-30 | Discharge: 2019-12-30 | Disposition: A | Discharge status: Home / Self Care | Payer: Medicare HMO

## 2020-01-01 ENCOUNTER — Encounter (HOSPITAL_COMMUNITY): Payer: Self-pay | Admitting: *Deleted

## 2020-01-01 ENCOUNTER — Other Ambulatory Visit: Payer: Self-pay

## 2020-01-01 ENCOUNTER — Ambulatory Visit (HOSPITAL_COMMUNITY)
Admission: EM | Admit: 2020-01-01 | Discharge: 2020-01-01 | Disposition: A | Discharge status: Home / Self Care | Payer: Medicare HMO | Attending: Internal Medicine | Admitting: Internal Medicine

## 2020-01-01 DIAGNOSIS — N3941 Urge incontinence: Secondary | ICD-10-CM | POA: Insufficient documentation

## 2020-01-01 DIAGNOSIS — R350 Frequency of micturition: Secondary | ICD-10-CM | POA: Diagnosis not present

## 2020-01-01 LAB — POCT URINALYSIS DIP (DEVICE)
Bilirubin Urine: NEGATIVE
Glucose, UA: NEGATIVE mg/dL
Hgb urine dipstick: NEGATIVE
Ketones, ur: NEGATIVE mg/dL
Nitrite: NEGATIVE
Protein, ur: NEGATIVE mg/dL
Specific Gravity, Urine: 1.025 (ref 1.005–1.030)
Urobilinogen, UA: 0.2 mg/dL (ref 0.0–1.0)
pH: 5.5 (ref 5.0–8.0)

## 2020-01-01 NOTE — ED Provider Notes (Signed)
[place]    CSN: [id number] Arrival date & time: 01/01/20  1334      History   Chief Complaint Chief Complaint  Patient presents with  . Polyuria    HPI Brandi Gomez is a 49 y.o. female comes to the urgent care with complaints of frequency of micturition.  Patient denies any dysuria.  She has urgency of micturition as well.  She sometimes gets urinary leakage.  No abdominal pain, flank pain, fever or chills.  Patient admits having poor oral intake.  No nausea or vomiting.   HPI  Past Medical History:  Diagnosis Date  . Anemia   . Anxiety   . Bipolar 1 disorder ([place])   . Common migraine 05/19/2015  . Depression   . Diabetes mellitus, type II ([place])   . Hypertension   . Irritable bowel syndrome (IBS)   . Mild mental retardation   . Obesity   . Partial complex seizure disorder with intractable epilepsy ([place]) 05/12/2014  . Seizures ([place])    intractable, sz 08/23/17  . Sleep apnea   . Stroke ([place])   . Type II or unspecified type diabetes mellitus without mention of complication, not stated as uncontrolled     Patient Active Problem List   Diagnosis Date Noted  . Ischemic cerebrovascular accident (CVA) of frontal lobe ([place]) 10/15/2019  . Acute CVA (cerebrovascular accident) ([place]) 10/12/2019  . HLD (hyperlipidemia) 10/12/2019  . CKD (chronic kidney disease) stage 3, GFR 30-59 ml/min 10/12/2019  . Acute ischemic stroke ([place]) 10/12/2019  . Chronic combined systolic and diastolic heart failure ([place])   . Depression 07/14/2019  . Bipolar disorder ([place]) 05/13/2019  . Bipolar 1 disorder ([place]) 05/13/2019  . Adjustment disorder with anxiety   . History of recent stroke 08/06/2017  . OSA (obstructive sleep apnea)   . Cryptogenic stroke ([place]) 05/20/2017  . Controlled type 2 diabetes mellitus without complication, without long-term current use of insulin ([place]) 05/08/2017  . Essential hypertension 07/27/2008  . Seizure disorder ([employer]) 02/05/2007    Past Surgical  History:  Procedure Laterality Date  . BUBBLE STUDY  11/15/2019   Procedure: BUBBLE STUDY;  Surgeon: Dorothy Spark, MD;  Location: [facility];  Service: Cardiovascular;;  . COLONOSCOPY     2012-normal , Dr Sharlett Iles  . ESOPHAGOGASTRODUODENOSCOPY     normal-Dr Patterson 2012  . LOOP RECORDER INSERTION N/A 05/30/2017   Procedure: Loop Recorder Insertion;  Surgeon: Constance Haw, MD;  Location: [facility];  Service: Cardiovascular;  Laterality: N/A;  . LOOP RECORDER REMOVAL N/A 03/04/2018   Procedure: LOOP RECORDER REMOVAL;  Surgeon: Constance Haw, MD;  Location: [facility];  Service: Cardiovascular;  Laterality: N/A;  . MYRINGOTOMY WITH TUBE PLACEMENT    . MYRINGOTOMY WITH TUBE PLACEMENT Right 11/05/2017   Procedure: MYRINGOTOMY WITH TUBE PLACEMENT;  Surgeon: Melissa Montane, MD;  Location: [facility];  Service: ENT;  Laterality: Right;  right T Tube placement  . NASAL SINUS SURGERY    . TEE WITHOUT CARDIOVERSION N/A 05/30/2017   Procedure: TRANSESOPHAGEAL ECHOCARDIOGRAM (TEE);  Surgeon: Acie Fredrickson Wonda Cheng, MD;  Location: [facility];  Service: Cardiovascular;  Laterality: N/A;  . TEE WITHOUT CARDIOVERSION N/A 11/15/2019   Procedure: TRANSESOPHAGEAL ECHOCARDIOGRAM (TEE);  Surgeon: Dorothy Spark, MD;  Location: [facility] END[facility];  Service: Cardiovascular;  Laterality: N/A;    OB History   No obstetric history on file.      Home Medications    Prior to Admission medications  Medication Sig Start Date End Date Taking? Authorizing Provider  acetaminophen (TYLENOL) 325 MG tablet Take 2 tablets (650 mg total) by mouth every 4 (four) hours as needed for mild pain (or temp > 37.5 C (99.5 F)). 10/19/19  Yes Angiulli, Lavon Paganini, PA-C  hydrALAZINE (APRESOLINE) 10 MG tablet Take 1 tablet (10 mg total) by mouth 2 (two) times daily. 10/19/19  Yes Angiulli, Lavon Paganini, PA-C  lamoTRIgine (LAMICTAL) 100 MG tablet Take 1 tablet (100 mg total) by mouth 2 (two) times daily. 12/29/19   Yes [name], NP  ARIPiprazole (ABILIFY) 5 MG tablet Take 1 tablet (5 mg total) by mouth daily. Patient not taking: Reported on 12/29/2019 11/12/19 01/01/20  Norma Fredrickson, MD  atorvastatin (LIPITOR) 40 MG tablet Take 1 tablet (40 mg total) by mouth daily at 6 PM. 10/19/19 01/01/20  Angiulli, Lavon Paganini, PA-C  carvedilol (COREG) 3.125 MG tablet Take 1 tablet (3.125 mg total) by mouth 2 (two) times daily. Patient not taking: Reported on 12/29/2019 11/01/19 01/01/20  Deberah Pelton, NP  isosorbide dinitrate (ISORDIL) 10 MG tablet Take 1 tablet (10 mg total) by mouth 2 (two) times daily. Patient not taking: Reported on 12/29/2019 10/19/19 01/01/20  Angiulli, Lavon Paganini, PA-C  methylphenidate (RITALIN) 5 MG tablet Take 1 tablet (5 mg total) by mouth 2 (two) times daily. Patient not taking: Reported on 12/29/2019 10/29/19 01/01/20  Izora Ribas, MD  torsemide (DEMADEX) 10 MG tablet Take 1 tablet (10 mg total) by mouth daily. Patient not taking: Reported on 12/29/2019 10/19/19 01/01/20  Angiulli, Lavon Paganini, PA-C  traZODone (DESYREL) 100 MG tablet Take 1 tablet (100 mg total) by mouth at bedtime. Patient not taking: Reported on 12/29/2019 10/19/19 01/01/20  Angiulli, Lavon Paganini, PA-C    Family History Family History  Problem Relation Age of Onset  . Diabetes Mother        passed away from accidental death  . Hypertension Mother   . Bipolar disorder Father   . Diabetes Daughter   . Leukemia Daughter   . Ovarian cancer Maternal Aunt     Social History Social History   Tobacco Use  . Smoking status: Never Smoker  . Smokeless tobacco: Never Used  Substance Use Topics  . Alcohol use: No  . Drug use: No     Allergies   Amoxicillin, Lisinopril, Hydrocodone, and Tegretol [carbamazepine]   Review of Systems Review of Systems  Constitutional: Positive for appetite change. Negative for activity change and fatigue.  HENT: Negative.   Respiratory: Negative for apnea, cough and shortness of  breath.   Cardiovascular: Negative for chest pain and palpitations.  Gastrointestinal: Negative for abdominal pain, nausea and vomiting.  Genitourinary: Positive for frequency and urgency. Negative for dysuria.  Musculoskeletal: Negative for arthralgias, neck pain and neck stiffness.  Skin: Negative.   Neurological: Negative for dizziness, weakness and headaches.     Physical Exam Triage Vital Signs ED Triage Vitals  Enc Vitals Group     BP 01/01/20 1614 128/67     Pulse Rate 01/01/20 1614 (!) 54     Resp 01/01/20 1614 18     Temp 01/01/20 1614 98.6 F (37 C)     Temp Source 01/01/20 1614 Oral     SpO2 01/01/20 1614 100 %     Weight --      Height --      Head Circumference --      Peak Flow --      Pain Score 01/01/20  1613 0     Pain Loc --      Pain Edu? --      Excl. in [place]? --    No data found.  Updated Vital Signs BP 128/67   Pulse (!) 54   Temp 98.6 F (37 C) (Oral)   Resp 18   LMP 12/15/2019 (Approximate)   SpO2 100%   Visual Acuity Right Eye Distance:   Left Eye Distance:   Bilateral Distance:    Right Eye Near:   Left Eye Near:    Bilateral Near:     Physical Exam Vitals and nursing note reviewed.  Constitutional:      Appearance: Normal appearance. She is not ill-appearing.  Cardiovascular:     Rate and Rhythm: Normal rate and regular rhythm.  Pulmonary:     Effort: Pulmonary effort is normal.     Breath sounds: Normal breath sounds. No wheezing, rhonchi or rales.  Abdominal:     General: Bowel sounds are normal.     Palpations: Abdomen is soft.  Skin:    Capillary Refill: Capillary refill takes less than 2 seconds.  Neurological:     Mental Status: She is alert.      UC Treatments / Results  Labs (all labs ordered are listed, but only abnormal results are displayed) Labs Reviewed  POCT URINALYSIS DIP (DEVICE) - Abnormal; Notable for the following components:      Result Value   Leukocytes,Ua TRACE (*)    All other components  within normal limits  URINE CULTURE    EKG   Radiology No results found.  Procedures Procedures (including critical care time)  Medications Ordered in UC Medications - No data to display  Initial Impression / Assessment and Plan / UC Course  I have reviewed the triage vital signs and the nursing notes.  Pertinent labs & imaging results that were available during my care of the patient were reviewed by me and considered in my medical decision making (see chart for details).    1.  Frequency of micturition likely urge incontinence: Urinalysis was positive for leukocyte esterase Urine culture sent No antibiotics at this time Patient may benefit from oxybutynin for urge incontinence but I will defer that decision to the primary care physician.  Patient has a follow-up visit in the coming week. Final Clinical Impressions(s) / UC Diagnoses   Final diagnoses:  Frequency of urination  Urge incontinence of urine   Discharge Instructions   None    ED Prescriptions    None     PDMP not reviewed this encounter.   Chase Picket, MD 01/01/20 1726

## 2020-01-01 NOTE — ED Triage Notes (Signed)
C/O polyuria, "a smell" with urination, genital pruritis without discharge x 1 wk.  Denies any fevers or dysuria.

## 2020-01-04 ENCOUNTER — Encounter: Payer: Self-pay | Admitting: Emergency Medicine

## 2020-01-04 ENCOUNTER — Telehealth (HOSPITAL_COMMUNITY): Payer: Self-pay | Admitting: Emergency Medicine

## 2020-01-04 ENCOUNTER — Other Ambulatory Visit: Payer: Self-pay

## 2020-01-04 ENCOUNTER — Ambulatory Visit (INDEPENDENT_AMBULATORY_CARE_PROVIDER_SITE_OTHER): Payer: Medicare HMO | Admitting: Emergency Medicine

## 2020-01-04 VITALS — BP 119/60 | HR 69 | Temp 97.2°F | Wt 282.6 lb

## 2020-01-04 DIAGNOSIS — E119 Type 2 diabetes mellitus without complications: Secondary | ICD-10-CM

## 2020-01-04 DIAGNOSIS — Z8673 Personal history of transient ischemic attack (TIA), and cerebral infarction without residual deficits: Secondary | ICD-10-CM | POA: Diagnosis not present

## 2020-01-04 DIAGNOSIS — R569 Unspecified convulsions: Secondary | ICD-10-CM | POA: Diagnosis not present

## 2020-01-04 DIAGNOSIS — Z8669 Personal history of other diseases of the nervous system and sense organs: Secondary | ICD-10-CM | POA: Diagnosis not present

## 2020-01-04 DIAGNOSIS — I110 Hypertensive heart disease with heart failure: Secondary | ICD-10-CM | POA: Diagnosis not present

## 2020-01-04 DIAGNOSIS — N1831 Chronic kidney disease, stage 3a: Secondary | ICD-10-CM | POA: Diagnosis not present

## 2020-01-04 DIAGNOSIS — I429 Cardiomyopathy, unspecified: Secondary | ICD-10-CM | POA: Diagnosis not present

## 2020-01-04 DIAGNOSIS — Z6841 Body Mass Index (BMI) 40.0 and over, adult: Secondary | ICD-10-CM | POA: Diagnosis not present

## 2020-01-04 DIAGNOSIS — I10 Essential (primary) hypertension: Secondary | ICD-10-CM | POA: Diagnosis not present

## 2020-01-04 DIAGNOSIS — E1159 Type 2 diabetes mellitus with other circulatory complications: Secondary | ICD-10-CM

## 2020-01-04 DIAGNOSIS — I5042 Chronic combined systolic (congestive) and diastolic (congestive) heart failure: Secondary | ICD-10-CM

## 2020-01-04 LAB — URINE CULTURE: Culture: >=100000 — AB

## 2020-01-04 LAB — COMPREHENSIVE METABOLIC PANEL
ALT: 12 IU/L (ref 0–32)
AST: 15 IU/L (ref 0–40)
Albumin/Globulin Ratio: 1.2 (ref 1.2–2.2)
Albumin: 4 g/dL (ref 3.8–4.8)
Alkaline Phosphatase: 106 IU/L (ref 39–117)
BUN/Creatinine Ratio: 11 (ref 9–23)
BUN: 15 mg/dL (ref 6–24)
Bilirubin Total: 0.3 mg/dL (ref 0.0–1.2)
CO2: 20 mmol/L (ref 20–29)
Calcium: 8.8 mg/dL (ref 8.7–10.2)
Chloride: 103 mmol/L (ref 96–106)
Creatinine, Ser: 1.35 mg/dL — ABNORMAL HIGH (ref 0.57–1.00)
GFR calc Af Amer: 53 mL/min/{1.73_m2} — ABNORMAL LOW (ref >59)
GFR calc non Af Amer: 46 mL/min/{1.73_m2} — ABNORMAL LOW (ref >59)
Globulin, Total: 3.4 g/dL (ref 1.5–4.5)
Glucose: 107 mg/dL — ABNORMAL HIGH (ref 65–99)
Potassium: 4.6 mmol/L (ref 3.5–5.2)
Sodium: 138 mmol/L (ref 134–144)
Total Protein: 7.4 g/dL (ref 6.0–8.5)

## 2020-01-04 LAB — HEMOGLOBIN A1C
Est. average glucose Bld gHb Est-mCnc: 131 mg/dL
Hgb A1c MFr Bld: 6.2 % — ABNORMAL HIGH (ref 4.8–5.6)

## 2020-01-04 MED ORDER — CARVEDILOL 3.125 MG PO TABS: 3.1250 mg | ORAL_TABLET | Freq: Two times a day (BID) | ORAL | 3 refills | Status: DC

## 2020-01-04 MED ORDER — CEPHALEXIN 500 MG PO CAPS: 500.0000 mg | ORAL_CAPSULE | Freq: Two times a day (BID) | ORAL | 0 refills | Status: AC

## 2020-01-04 MED ORDER — LAMOTRIGINE 100 MG PO TABS: 100.0000 mg | ORAL_TABLET | Freq: Two times a day (BID) | ORAL | 3 refills | Status: DC

## 2020-01-04 MED ORDER — ISOSORBIDE DINITRATE 10 MG PO TABS: 10.0000 mg | ORAL_TABLET | Freq: Two times a day (BID) | ORAL | 3 refills | Status: DC

## 2020-01-04 MED ORDER — HYDRALAZINE HCL 10 MG PO TABS: 10.0000 mg | ORAL_TABLET | Freq: Two times a day (BID) | ORAL | 3 refills | Status: DC

## 2020-01-04 MED ORDER — ATORVASTATIN CALCIUM 40 MG PO TABS: 40.0000 mg | ORAL_TABLET | Freq: Every day | ORAL | 3 refills | Status: DC

## 2020-01-04 NOTE — Patient Instructions (Addendum)
   If you have lab work done today you will be contacted with your lab results within the next 2 weeks.  If you have not heard from us then please contact us. The fastest way to get your results is to register for My Chart.   IF you received an x-ray today, you will receive an invoice from [facility]. Please contact [facility] at [phone] with questions or concerns regarding your invoice.   IF you received labwork today, you will receive an invoice from [facility]. Please contact [facility] at [phone] with questions or concerns regarding your invoice.   Our billing staff will not be able to assist you with questions regarding bills from these companies.  You will be contacted with the lab results as soon as they are available. The fastest way to get your results is to activate your My Chart account. Instructions are located on the last page of this paperwork. If you have not heard from us regarding the results in 2 weeks, please contact this office.      Health Maintenance, Female Adopting a healthy lifestyle and getting preventive care are important in promoting health and wellness. Ask your health care provider about:  The right schedule for you to have regular tests and exams.  Things you can do on your own to prevent diseases and keep yourself healthy. What should I know about diet, weight, and exercise? Eat a healthy diet   Eat a diet that includes plenty of vegetables, fruits, low-fat dairy products, and lean protein.  Do not eat a lot of foods that are high in solid fats, added sugars, or sodium. Maintain a healthy weight Body mass index (BMI) is used to identify weight problems. It estimates body fat based on height and weight. Your health care provider can help determine your BMI and help you achieve or maintain a healthy weight. Get regular exercise Get regular exercise. This is one of the most important things you can do for your health. Most  adults should:  Exercise for at least 150 minutes each week. The exercise should increase your heart rate and make you sweat (moderate-intensity exercise).  Do strengthening exercises at least twice a week. This is in addition to the moderate-intensity exercise.  Spend less time sitting. Even light physical activity can be beneficial. Watch cholesterol and blood lipids Have your blood tested for lipids and cholesterol at 51 years of age, then have this test every 5 years. Have your cholesterol levels checked more often if:  Your lipid or cholesterol levels are high.  You are older than 51 years of age.  You are at high risk for heart disease. What should I know about cancer screening? Depending on your health history and family history, you may need to have cancer screening at various ages. This may include screening for:  Breast cancer.  Cervical cancer.  Colorectal cancer.  Skin cancer.  Lung cancer. What should I know about heart disease, diabetes, and high blood pressure? Blood pressure and heart disease  High blood pressure causes heart disease and increases the risk of stroke. This is more likely to develop in people who have high blood pressure readings, are of African descent, or are overweight.  Have your blood pressure checked: ? Every 3-5 years if you are 18-39 years of age. ? Every year if you are 40 years old or older. Diabetes Have regular diabetes screenings. This checks your fasting blood sugar level. Have the screening done:  Once every   three years after age 40 if you are at a normal weight and have a low risk for diabetes.  More often and at a younger age if you are overweight or have a high risk for diabetes. What should I know about preventing infection? Hepatitis B If you have a higher risk for hepatitis B, you should be screened for this virus. Talk with your health care provider to find out if you are at risk for hepatitis B infection. Hepatitis  C Testing is recommended for:  Everyone born from 1945 through 1965.  Anyone with known risk factors for hepatitis C. Sexually transmitted infections (STIs)  Get screened for STIs, including gonorrhea and chlamydia, if: ? You are sexually active and are younger than 51 years of age. ? You are older than 51 years of age and your health care provider tells you that you are at risk for this type of infection. ? Your sexual activity has changed since you were last screened, and you are at increased risk for chlamydia or gonorrhea. Ask your health care provider if you are at risk.  Ask your health care provider about whether you are at high risk for HIV. Your health care provider may recommend a prescription medicine to help prevent HIV infection. If you choose to take medicine to prevent HIV, you should first get tested for HIV. You should then be tested every 3 months for as long as you are taking the medicine. Pregnancy  If you are about to stop having your period (premenopausal) and you may become pregnant, seek counseling before you get pregnant.  Take 400 to 800 micrograms (mcg) of folic acid every day if you become pregnant.  Ask for birth control (contraception) if you want to prevent pregnancy. Osteoporosis and menopause Osteoporosis is a disease in which the bones lose minerals and strength with aging. This can result in bone fractures. If you are 65 years old or older, or if you are at risk for osteoporosis and fractures, ask your health care provider if you should:  Be screened for bone loss.  Take a calcium or vitamin D supplement to lower your risk of fractures.  Be given hormone replacement therapy (HRT) to treat symptoms of menopause. Follow these instructions at home: Lifestyle  Do not use any products that contain nicotine or tobacco, such as cigarettes, e-cigarettes, and chewing tobacco. If you need help quitting, ask your health care provider.  Do not use street  drugs.  Do not share needles.  Ask your health care provider for help if you need support or information about quitting drugs. Alcohol use  Do not drink alcohol if: ? Your health care provider tells you not to drink. ? You are pregnant, may be pregnant, or are planning to become pregnant.  If you drink alcohol: ? Limit how much you use to 0-1 drink a day. ? Limit intake if you are breastfeeding.  Be aware of how much alcohol is in your drink. In the [place] one drink equals one 12 oz bottle of beer (355 mL), one 5 oz glass of wine (148 mL), or one 1 oz glass of hard liquor (44 mL). General instructions  Schedule regular health, dental, and eye exams.  Stay current with your vaccines.  Tell your health care provider if: ? You often feel depressed. ? You have ever been abused or do not feel safe at home. Summary  Adopting a healthy lifestyle and getting preventive care are important in promoting health and wellness.    Follow your health care provider's instructions about healthy diet, exercising, and getting tested or screened for diseases.  Follow your health care provider's instructions on monitoring your cholesterol and blood pressure. This information is not intended to replace advice given to you by your health care provider. Make sure you discuss any questions you have with your health care provider. Document Revised: 11/18/2018 Document Reviewed: 11/18/2018 Elsevier Patient Education  2020 [employer]  Hypertension, Adult High blood pressure (hypertension) is when the force of blood pumping through the arteries is too strong. The arteries are the blood vessels that carry blood from the heart throughout the body. Hypertension forces the heart to work harder to pump blood and may cause arteries to become narrow or stiff. Untreated or uncontrolled hypertension can cause a heart attack, heart failure, a stroke, kidney disease, and other problems. A blood pressure reading  consists of a higher number over a lower number. Ideally, your blood pressure should be below 120/80. The first ("top") number is called the systolic pressure. It is a measure of the pressure in your arteries as your heart beats. The second ("bottom") number is called the diastolic pressure. It is a measure of the pressure in your arteries as the heart relaxes. What are the causes? The exact cause of this condition is not known. There are some conditions that result in or are related to high blood pressure. What increases the risk? Some risk factors for high blood pressure are under your control. The following factors may make you more likely to develop this condition:  Smoking.  Having type 2 diabetes mellitus, high cholesterol, or both.  Not getting enough exercise or physical activity.  Being overweight.  Having too much fat, sugar, calories, or salt (sodium) in your diet.  Drinking too much alcohol. Some risk factors for high blood pressure may be difficult or impossible to change. Some of these factors include:  Having chronic kidney disease.  Having a family history of high blood pressure.  Age. Risk increases with age.  Race. You may be at higher risk if you are African American.  Gender. Men are at higher risk than women before age 45. After age 65, women are at higher risk than men.  Having obstructive sleep apnea.  Stress. What are the signs or symptoms? High blood pressure may not cause symptoms. Very high blood pressure (hypertensive crisis) may cause:  Headache.  Anxiety.  Shortness of breath.  Nosebleed.  Nausea and vomiting.  Vision changes.  Severe chest pain.  Seizures. How is this diagnosed? This condition is diagnosed by measuring your blood pressure while you are seated, with your arm resting on a flat surface, your legs uncrossed, and your feet flat on the floor. The cuff of the blood pressure monitor will be placed directly against the skin of  your upper arm at the level of your heart. It should be measured at least twice using the same arm. Certain conditions can cause a difference in blood pressure between your right and left arms. Certain factors can cause blood pressure readings to be lower or higher than normal for a short period of time:  When your blood pressure is higher when you are in a health care provider's office than when you are at home, this is called white coat hypertension. Most people with this condition do not need medicines.  When your blood pressure is higher at home than when you are in a health care provider's office, this is called masked hypertension.   Most people with this condition may need medicines to control blood pressure. If you have a high blood pressure reading during one visit or you have normal blood pressure with other risk factors, you may be asked to:  Return on a different day to have your blood pressure checked again.  Monitor your blood pressure at home for 1 week or longer. If you are diagnosed with hypertension, you may have other blood or imaging tests to help your health care provider understand your overall risk for other conditions. How is this treated? This condition is treated by making healthy lifestyle changes, such as eating healthy foods, exercising more, and reducing your alcohol intake. Your health care provider may prescribe medicine if lifestyle changes are not enough to get your blood pressure under control, and if:  Your systolic blood pressure is above 130.  Your diastolic blood pressure is above 80. Your personal target blood pressure may vary depending on your medical conditions, your age, and other factors. Follow these instructions at home: Eating and drinking   Eat a diet that is high in fiber and potassium, and low in sodium, added sugar, and fat. An example eating plan is called the DASH (Dietary Approaches to Stop Hypertension) diet. To eat this way: ? Eat plenty  of fresh fruits and vegetables. Try to fill one half of your plate at each meal with fruits and vegetables. ? Eat whole grains, such as whole-wheat pasta, brown rice, or whole-grain bread. Fill about one fourth of your plate with whole grains. ? Eat or drink low-fat dairy products, such as skim milk or low-fat yogurt. ? Avoid fatty cuts of meat, processed or cured meats, and poultry with skin. Fill about one fourth of your plate with lean proteins, such as fish, chicken without skin, beans, eggs, or tofu. ? Avoid pre-made and processed foods. These tend to be higher in sodium, added sugar, and fat.  Reduce your daily sodium intake. Most people with hypertension should eat less than 1,500 mg of sodium a day.  Do not drink alcohol if: ? Your health care provider tells you not to drink. ? You are pregnant, may be pregnant, or are planning to become pregnant.  If you drink alcohol: ? Limit how much you use to:  0-1 drink a day for women.  0-2 drinks a day for men. ? Be aware of how much alcohol is in your drink. In the [place] one drink equals one 12 oz bottle of beer (355 mL), one 5 oz glass of wine (148 mL), or one 1 oz glass of hard liquor (44 mL). Lifestyle   Work with your health care provider to maintain a healthy body weight or to lose weight. Ask what an ideal weight is for you.  Get at least 30 minutes of exercise most days of the week. Activities may include walking, swimming, or biking.  Include exercise to strengthen your muscles (resistance exercise), such as Pilates or lifting weights, as part of your weekly exercise routine. Try to do these types of exercises for 30 minutes at least 3 days a week.  Do not use any products that contain nicotine or tobacco, such as cigarettes, e-cigarettes, and chewing tobacco. If you need help quitting, ask your health care provider.  Monitor your blood pressure at home as told by your health care provider.  Keep all follow-up visits as told  by your health care provider. This is important. Medicines  Take over-the-counter and prescription medicines only as told by   your health care provider. Follow directions carefully. Blood pressure medicines must be taken as prescribed.  Do not skip doses of blood pressure medicine. Doing this puts you at risk for problems and can make the medicine less effective.  Ask your health care provider about side effects or reactions to medicines that you should watch for. Contact a health care provider if you:  Think you are having a reaction to a medicine you are taking.  Have headaches that keep coming back (recurring).  Feel dizzy.  Have swelling in your ankles.  Have trouble with your vision. Get help right away if you:  Develop a severe headache or confusion.  Have unusual weakness or numbness.  Feel faint.  Have severe pain in your chest or abdomen.  Vomit repeatedly.  Have trouble breathing. Summary  Hypertension is when the force of blood pumping through your arteries is too strong. If this condition is not controlled, it may put you at risk for serious complications.  Your personal target blood pressure may vary depending on your medical conditions, your age, and other factors. For most people, a normal blood pressure is less than 120/80.  Hypertension is treated with lifestyle changes, medicines, or a combination of both. Lifestyle changes include losing weight, eating a healthy, low-sodium diet, exercising more, and limiting alcohol. This information is not intended to replace advice given to you by your health care provider. Make sure you discuss any questions you have with your health care provider. Document Revised: 08/05/2018 Document Reviewed: 08/05/2018 Elsevier Patient Education  2020 [employer]  

## 2020-01-04 NOTE — Telephone Encounter (Signed)
Urine culture was positive for ecoli. Prescription for keflex bid x5 days per Dr. Mannie Stabile sent to pharmacy of choice. Pt called and made aware. Pt educated to follow up if symptoms are not improving. Verbalized understanding.

## 2020-01-04 NOTE — Progress Notes (Signed)
1.  Chronic systolic and diastolic heart [id number] echocardiogram showed reduced LVEF from 40-45% to 35-40%. Her beta-blocker was held during her recent admission due to bradycardia. She has a history of angioedema with lisinopril so no ARB or ARNI. Hospital course addedlow-dose hydralazine/nitrates. Continue hydralazine 10 mg twice daily Continue isosorbide mononitrate 10 mg twice daily Continue carvedilol 3.125 twice daily Heart healthy low-sodium diet-salty 6 given Increase physical activity as tolerated Daily weights-heart failure action plan given.  Recurrent CVA-no atrial fibrillation/PFO noted with prior stroke. Loop recorder removed by EP 03/04/2018 due to patient discomfort.  TEE showed LVEF 35 to 40% and no intracardiac thrombus Continue aspirin81  Essential hypertension-BP today 126/82. Continue carvedilol 3.125 mg twice daily Continue hydralazine 10 mg twice daily Continue isosorbide dinitrate 10 mg tablet twice daily Heart healthy low-sodium diet Increase physical activity as tolerated-goal 20 minutes 5 days a week  Disposition: Follow-up with Dr. Oval Linsey in 3 months.  [name] NP-C  ASSESSMENT AND PLAN 49 y.o. year old female  has a past medical history of Anemia, Anxiety, Bipolar 1 disorder (Brandi Gomez), Common migraine (05/19/2015), Depression, Diabetes mellitus, type II (Brandi Gomez), Hypertension, Irritable bowel syndrome (IBS), Mild mental retardation, Obesity, Partial complex seizure disorder with intractable epilepsy (Brandi Gomez) (05/12/2014), Seizures ([place]), Sleep apnea, Stroke (Brandi Gomez), and Type II or unspecified type diabetes mellitus without mention of complication, not stated as uncontrolled. here with:  1. Left ACA territory small patchy infarct, embolic pattern, could be secondary to cardiomyopathy -MRI showed posterior left frontal lobe infarct left ACA territory, small vessel disease -MRA was unremarkable -Carotid Doppler bilateral ICA 1-39% stenosis,  vertebral arteries antegrade  -2D echo EF 35 to 40% (following with cardiology) -LDL 64, A1c 6.5 -Was not on antithrombotic prior to admission, was treated with aspirin and Plavix for 3 weeks, now aspirin alone (she will restart aspirin 81 mg daily) -Previously had loop recorder June 2018, no A. fib found, removed March 2019 per patient request -Now taking Lipitor 40 mg daily LDL 64, goal less than 70 -Saw cardiology 11/25/2019, she was to continue hydralazine, isosorbide mononitrate, carvedilol, continue aspirin 81 mg daily  2.  History of intractable seizures, 2 breakthrough seizures while hospitalized -Has been unable to tolerate multiple AEDs (Keppra, Tegretol, Vimpat) -Now taking Lamictal 100 mg twice a day, no recurrent seizure -Overnight EEG has shown frequent spikes at bilateral frontal, excessive beta, generalized continuous slowing, patient may be a candidate for epilepsy surgery -Continue taking Lamictal, will place referral for [facility] for evaluation possibly epilepsy surgery consultation  Overall, she appears to have no deficits from her recent stroke, however she reports weakness to the left hand holding objects.  She has no residual right-sided weakness.  She has been out of her medications for several weeks, she needs to contact her specialist for refills.  She should restart taking aspirin 81 mg daily, continue Lamictal.  I sent a message to her primary doctor to try to get her in for hospital follow-up.  She needs to reach out to her psychiatrist if she feels she needs to restart her depression medications. I discussed with her significant other, [name] he will likely need to assist her getting her medications.  She will follow up here in 4 months or sooner if needed.  I did advise her symptoms worsen or she develops any new symptoms she should let us know. I will place a referral for [facility] for possibly epilepsy surgery.   MRI of the brain 10/12/2019 IMPRESSION: 1.  12 mm  acute ischemic nonhemorrhagic posterior left frontal lobe infarct, left ACA distribution. No associated hemorrhage or mass effect. 2. Underlying mild chronic microvascular ischemic disease.  Overnight EEG 10/14/2019 IMPRESSION: This studyshowed evidence of epileptogenicity arising from left and right frontal region. Its possible that patient has a single focus with either left or right spread vs independent bilateral foci. Additionally, there is evidence ofmild diffuse encephalopathy, nonspecific to etiology. No seizures were seen throughout the recording.  I spent 25 minutes with the patient. 50% of this time was spent discussing her plan of care.   Butler Denmark, AGNP-C, DNP 12/29/2019, 3:10 PM [facility] [address], [place], [place] [address] [phone]  Brandi Gomez 49 y.o.   Chief Complaint  Patient presents with  . Medical Management of Chronic Issues    Patient states she is confused abo0ut what meds she is supposed to be taking. Talked to a medicare review person about meds.Patient stated she had a stroke in Nov and alot of meds were given by the Hospital and this is why she is confused on what meds she need to be taking    HISTORY OF PRESENT ILLNESS: This is a 49 y.o. female here for follow-up of chronic medical problems. #1 hypertension and chronic combined systolic-diastolic congestive heart failure.  Saw cardiologist last December.  Office note reviewed.  Patient on hydralazine, isosorbide dinitrate, and carvedilol.  Allergic to ACE and ARB's. 2.  History of seizure disorder on Lamictal.  Recent office visit with neurologist on 12/29/2019 reviewed. 3.  Chronic kidney disease: Stable 4.  History of diabetes: Controlled.  Off Metformin. 5.  History of CVA, on baby aspirin daily. 6.  Dyslipidemia: On Lipitor 40 mg daily. Medication list reviewed with patient.  Specialists office notes reviewed in this regard.  HPI   Prior  to Admission medications   Medication Sig Start Date End Date Taking? Authorizing Provider  acetaminophen (TYLENOL) 325 MG tablet Take 2 tablets (650 mg total) by mouth every 4 (four) hours as needed for mild pain (or temp > 37.5 C (99.5 F)). 10/19/19  Yes Angiulli, Lavon Paganini, PA-C  atorvastatin (LIPITOR) 40 MG tablet Take 1 tablet (40 mg total) by mouth daily at 6 PM. 01/04/20 04/03/20 Yes Chioke Noxon, Ines Bloomer, MD  carvedilol (COREG) 3.125 MG tablet Take 1 tablet (3.125 mg total) by mouth 2 (two) times daily. 01/04/20 04/03/20 Yes Tamir Wallman, Ines Bloomer, MD  hydrALAZINE (APRESOLINE) 10 MG tablet Take 1 tablet (10 mg total) by mouth 2 (two) times daily. 01/04/20 04/03/20 Yes Abir Eroh, Ines Bloomer, MD  isosorbide dinitrate (ISORDIL) 10 MG tablet Take 1 tablet (10 mg total) by mouth 2 (two) times daily. 01/04/20 04/03/20 Yes Shruti Arrey, Ines Bloomer, MD  lamoTRIgine (LAMICTAL) 100 MG tablet Take 1 tablet (100 mg total) by mouth 2 (two) times daily. 01/04/20 04/03/20 Yes Yves Fodor, Ines Bloomer, MD  ARIPiprazole (ABILIFY) 5 MG tablet Take 1 tablet (5 mg total) by mouth daily. Patient not taking: Reported on 12/29/2019 11/12/19 01/01/20  Norma Fredrickson, MD  methylphenidate (RITALIN) 5 MG tablet Take 1 tablet (5 mg total) by mouth 2 (two) times daily. Patient not taking: Reported on 12/29/2019 10/29/19 01/01/20  Izora Ribas, MD  torsemide (DEMADEX) 10 MG tablet Take 1 tablet (10 mg total) by mouth daily. Patient not taking: Reported on 12/29/2019 10/19/19 01/01/20  Angiulli, Lavon Paganini, PA-C  traZODone (DESYREL) 100 MG tablet Take 1 tablet (100 mg total) by mouth at bedtime. Patient not taking: Reported on 12/29/2019 10/19/19  01/01/20  Angiulli, Lavon Paganini, PA-C    Allergies  Allergen Reactions  . Amoxicillin Itching    Did it involve swelling of the face/tongue/throat, SOB, or low BP? No Did it involve sudden or severe rash/hives, skin peeling, or any reaction on the inside of your mouth or nose? No Did you need to  seek medical attention at a hospital or doctor's office? No When did it last happen?within the past 10 years If all above answers are "NO", may proceed with cephalosporin use.   Marland Kitchen Lisinopril Swelling    Angioedema   . Hydrocodone Other (See Comments)    Depressed   . Tegretol [Carbamazepine] Swelling    Throat swells    Patient Active Problem List   Diagnosis Date Noted  . Ischemic cerebrovascular accident (CVA) of frontal lobe ([place]) 10/15/2019  . Acute CVA (cerebrovascular accident) ([place]) 10/12/2019  . HLD (hyperlipidemia) 10/12/2019  . CKD (chronic kidney disease) stage 3, GFR 30-59 ml/min 10/12/2019  . Acute ischemic stroke ([place]) 10/12/2019  . Chronic combined systolic and diastolic heart failure ([place])   . Depression 07/14/2019  . Bipolar disorder ([place]) 05/13/2019  . Bipolar 1 disorder ([place]) 05/13/2019  . Adjustment disorder with anxiety   . History of recent stroke 08/06/2017  . OSA (obstructive sleep apnea)   . Cryptogenic stroke ([place]) 05/20/2017  . Controlled type 2 diabetes mellitus without complication, without long-term current use of insulin ([place]) 05/08/2017  . Essential hypertension 07/27/2008  . Seizure disorder ([place]) 02/05/2007    Past Medical History:  Diagnosis Date  . Anemia   . Anxiety   . Bipolar 1 disorder ([place])   . Common migraine 05/19/2015  . Depression   . Diabetes mellitus, type II ([place])   . Hypertension   . Irritable bowel syndrome (IBS)   . Mild mental retardation   . Obesity   . Partial complex seizure disorder with intractable epilepsy ([place]) 05/12/2014  . Seizures ([place])    intractable, sz 08/23/17  . Sleep apnea   . Stroke ([place])   . Type II or unspecified type diabetes mellitus without mention of complication, not stated as uncontrolled     Past Surgical History:  Procedure Laterality Date  . BUBBLE STUDY  11/15/2019   Procedure: BUBBLE STUDY;  Surgeon: Dorothy Spark, MD;  Location: [facility];  Service: Cardiovascular;;  .  COLONOSCOPY     2012-normal , Dr Sharlett Iles  . ESOPHAGOGASTRODUODENOSCOPY     normal-Dr Patterson 2012  . LOOP RECORDER INSERTION N/A 05/30/2017   Procedure: Loop Recorder Insertion;  Surgeon: Constance Haw, MD;  Location: [facility];  Service: Cardiovascular;  Laterality: N/A;  . LOOP RECORDER REMOVAL N/A 03/04/2018   Procedure: LOOP RECORDER REMOVAL;  Surgeon: Constance Haw, MD;  Location: [facility];  Service: Cardiovascular;  Laterality: N/A;  . MYRINGOTOMY WITH TUBE PLACEMENT    . MYRINGOTOMY WITH TUBE PLACEMENT Right 11/05/2017   Procedure: MYRINGOTOMY WITH TUBE PLACEMENT;  Surgeon: Melissa Montane, MD;  Location: [facility];  Service: ENT;  Laterality: Right;  right T Tube placement  . NASAL SINUS SURGERY    . TEE WITHOUT CARDIOVERSION N/A 05/30/2017   Procedure: TRANSESOPHAGEAL ECHOCARDIOGRAM (TEE);  Surgeon: Acie Fredrickson Wonda Cheng, MD;  Location: [facility];  Service: Cardiovascular;  Laterality: N/A;  . TEE WITHOUT CARDIOVERSION N/A 11/15/2019   Procedure: TRANSESOPHAGEAL ECHOCARDIOGRAM (TEE);  Surgeon: Dorothy Spark, MD;  Location: [facility];  Service: Cardiovascular;  Laterality: N/A;    Social History  Socioeconomic History  . Marital status: Single    Spouse name: [name]  . Number of children: 3  . Years of education: 21  . Highest education level: Not on file  Occupational History  . Occupation: disabled    Employer: DISABLED  Tobacco Use  . Smoking status: Never Smoker  . Smokeless tobacco: Never Used  Substance and Sexual Activity  . Alcohol use: No  . Drug use: No  . Sexual activity: Never  Other Topics Concern  . Not on file  Social History Narrative   Patient lives at home with daughter.    Patient has 3 children.    Patient is right handed.    Patient has a high school education.    Patient is on disability   Patient drinks 2 cups of caffeine daily.   Social Determinants of Health   Financial Resource Strain:   . Difficulty  of Paying Living Expenses: Not on file  Food Insecurity: No Food Insecurity  . Worried About Charity fundraiser in the Last Year: Never true  . Ran Out of Food in the Last Year: Never true  Transportation Needs: No Transportation Needs  . Lack of Transportation (Medical): No  . Lack of Transportation (Non-Medical): No  Physical Activity:   . Days of Exercise per Week: Not on file  . Minutes of Exercise per Session: Not on file  Stress:   . Feeling of Stress : Not on file  Social Connections:   . Frequency of Communication with Friends and Family: Not on file  . Frequency of Social Gatherings with Friends and Family: Not on file  . Attends Religious Services: Not on file  . Active Member of Clubs or Organizations: Not on file  . Attends Archivist Meetings: Not on file  . Marital Status: Not on file  Intimate Partner Violence:   . Fear of Current or Ex-Partner: Not on file  . Emotionally Abused: Not on file  . Physically Abused: Not on file  . Sexually Abused: Not on file    Family History  Problem Relation Age of Onset  . Diabetes Mother        passed away from accidental death  . Hypertension Mother   . Bipolar disorder Father   . Diabetes Daughter   . Leukemia Daughter   . Ovarian cancer Maternal Aunt      Review of Systems  Constitutional: Negative.  Negative for chills and fever.  HENT: Negative.  Negative for congestion and sore throat.   Respiratory: Negative.  Negative for cough and shortness of breath.   Cardiovascular: Negative.  Negative for chest pain and palpitations.  Gastrointestinal: Negative.  Negative for abdominal pain, blood in stool, diarrhea, melena, nausea and vomiting.  Genitourinary: Negative.  Negative for dysuria and hematuria.  Musculoskeletal: Negative.  Negative for back pain, myalgias and neck pain.  Skin: Negative.  Negative for rash.  Neurological: Negative.  Negative for dizziness and headaches.  All other systems reviewed  and are negative.  Today's Vitals   01/04/20 1004  BP: 119/60  Pulse: 69  Temp: (!) 97.2 F (36.2 C)  TempSrc: Temporal  SpO2: 99%  Weight: 282 lb 9.6 oz (128.2 kg)   Body mass index is 53.4 kg/m.   Physical Exam Vitals reviewed.  Constitutional:      Appearance: Normal appearance. She is obese.  HENT:     Head: Normocephalic.  Eyes:     Extraocular Movements: Extraocular movements intact.  Conjunctiva/sclera: Conjunctivae normal.     Pupils: Pupils are equal, round, and reactive to light.  Cardiovascular:     Rate and Rhythm: Normal rate and regular rhythm.     Pulses: Normal pulses.     Heart sounds: Normal heart sounds.  Pulmonary:     Effort: Pulmonary effort is normal.     Breath sounds: Normal breath sounds.  Abdominal:     Palpations: Abdomen is soft.     Tenderness: There is no abdominal tenderness.  Musculoskeletal:        General: Normal range of motion.     Cervical back: Normal range of motion and neck supple.  Skin:    General: Skin is warm and dry.     Capillary Refill: Capillary refill takes less than 2 seconds.  Neurological:     General: No focal deficit present.     Mental Status: She is alert and oriented to person, place, and time.  Psychiatric:        Mood and Affect: Mood normal.        Behavior: Behavior normal.    Clinically stable.  No medical concerns identified during this visit. A total of 30 minutes was spent with the patient, greater than 50% of which was in counseling/coordination of care regarding chronic medical problems, review of most recent blood work, review of most recent office visit notes from specialists, review of all medications, diet, nutrition, and need for increase physical activity, prognosis, and need for follow-up in 3 months.   ASSESSMENT & PLAN: Brandi Gomez was seen today for medical management of chronic issues.  Diagnoses and all orders for this visit:  Hypertension associated with diabetes  ([place])  Essential hypertension -     Comprehensive metabolic panel  Controlled type 2 diabetes mellitus without complication, without long-term current use of insulin (HCC) -     Hemoglobin A1c -     hydrALAZINE (APRESOLINE) 10 MG tablet; Take 1 tablet (10 mg total) by mouth 2 (two) times daily.  Stage 3a chronic kidney disease  History of seizure disorder  Chronic combined systolic and diastolic heart failure (H[facility])  History of stroke  Other orders -     isosorbide dinitrate (ISORDIL) 10 MG tablet; Take 1 tablet (10 mg total) by mouth 2 (two) times daily. -     lamoTRIgine (LAMICTAL) 100 MG tablet; Take 1 tablet (100 mg total) by mouth 2 (two) times daily. -     carvedilol (COREG) 3.125 MG tablet; Take 1 tablet (3.125 mg total) by mouth 2 (two) times daily. -     atorvastatin (LIPITOR) 40 MG tablet; Take 1 tablet (40 mg total) by mouth daily at 6 PM.    Patient Instructions       If you have lab work done today you will be contacted with your lab results within the next 2 weeks.  If you have not heard from [place] then please contact us. The fastest way to get your results is to register for My Chart.   IF you received an x-ray today, you will receive an invoice from [facility]. Please contact [facility] at [phone] with questions or concerns regarding your invoice.   IF you received labwork today, you will receive an invoice from [facility]. Please contact [facility] at [phone] with questions or concerns regarding your invoice.   Our billing staff will not be able to assist you with questions regarding bills from these companies.  You will be contacted with the lab results  as soon as they are available. The fastest way to get your results is to activate your My Chart account. Instructions are located on the last page of this paperwork. If you have not heard from [place] regarding the results in 2 weeks, please contact this office.      Health Maintenance,  Female Adopting a healthy lifestyle and getting preventive care are important in promoting health and wellness. Ask your health care provider about:  The right schedule for you to have regular tests and exams.  Things you can do on your own to prevent diseases and keep yourself healthy. What should I know about diet, weight, and exercise? Eat a healthy diet   Eat a diet that includes plenty of vegetables, fruits, low-fat dairy products, and lean protein.  Do not eat a lot of foods that are high in solid fats, added sugars, or sodium. Maintain a healthy weight Body mass index (BMI) is used to identify weight problems. It estimates body fat based on height and weight. Your health care provider can help determine your BMI and help you achieve or maintain a healthy weight. Get regular exercise Get regular exercise. This is one of the most important things you can do for your health. Most adults should:  Exercise for at least 150 minutes each week. The exercise should increase your heart rate and make you sweat (moderate-intensity exercise).  Do strengthening exercises at least twice a week. This is in addition to the moderate-intensity exercise.  Spend less time sitting. Even light physical activity can be beneficial. Watch cholesterol and blood lipids Have your blood tested for lipids and cholesterol at 51 years of age, then have this test every 5 years. Have your cholesterol levels checked more often if:  Your lipid or cholesterol levels are high.  You are older than 51 years of age.  You are at high risk for heart disease. What should I know about cancer screening? Depending on your health history and family history, you may need to have cancer screening at various ages. This may include screening for:  Breast cancer.  Cervical cancer.  Colorectal cancer.  Skin cancer.  Lung cancer. What should I know about heart disease, diabetes, and high blood pressure? Blood pressure  and heart disease  High blood pressure causes heart disease and increases the risk of stroke. This is more likely to develop in people who have high blood pressure readings, are of African descent, or are overweight.  Have your blood pressure checked: ? Every 3-5 years if you are 55-65 years of age. ? Every year if you are 21 years old or older. Diabetes Have regular diabetes screenings. This checks your fasting blood sugar level. Have the screening done:  Once every three years after age 46 if you are at a normal weight and have a low risk for diabetes.  More often and at a younger age if you are overweight or have a high risk for diabetes. What should I know about preventing infection? Hepatitis B If you have a higher risk for hepatitis B, you should be screened for this virus. Talk with your health care provider to find out if you are at risk for hepatitis B infection. Hepatitis C Testing is recommended for:  Everyone born from [birth year] through 1967.  Anyone with known risk factors for hepatitis C. Sexually transmitted infections (STIs)  Get screened for STIs, including gonorrhea and chlamydia, if: ? You are sexually active and are younger than 51 years of age. ?  You are older than 51 years of age and your health care provider tells you that you are at risk for this type of infection. ? Your sexual activity has changed since you were last screened, and you are at increased risk for chlamydia or gonorrhea. Ask your health care provider if you are at risk.  Ask your health care provider about whether you are at high risk for HIV. Your health care provider may recommend a prescription medicine to help prevent HIV infection. If you choose to take medicine to prevent HIV, you should first get tested for HIV. You should then be tested every 3 months for as long as you are taking the medicine. Pregnancy  If you are about to stop having your period (premenopausal) and you may become pregnant,  seek counseling before you get pregnant.  Take 400 to 800 micrograms (mcg) of folic acid every day if you become pregnant.  Ask for birth control (contraception) if you want to prevent pregnancy. Osteoporosis and menopause Osteoporosis is a disease in which the bones lose minerals and strength with aging. This can result in bone fractures. If you are 29 years old or older, or if you are at risk for osteoporosis and fractures, ask your health care provider if you should:  Be screened for bone loss.  Take a calcium or vitamin D supplement to lower your risk of fractures.  Be given hormone replacement therapy (HRT) to treat symptoms of menopause. Follow these instructions at home: Lifestyle  Do not use any products that contain nicotine or tobacco, such as cigarettes, e-cigarettes, and chewing tobacco. If you need help quitting, ask your health care provider.  Do not use street drugs.  Do not share needles.  Ask your health care provider for help if you need support or information about quitting drugs. Alcohol use  Do not drink alcohol if: ? Your health care provider tells you not to drink. ? You are pregnant, may be pregnant, or are planning to become pregnant.  If you drink alcohol: ? Limit how much you use to 0-1 drink a day. ? Limit intake if you are breastfeeding.  Be aware of how much alcohol is in your drink. In the [place] one drink equals one 12 oz bottle of beer (355 mL), one 5 oz glass of wine (148 mL), or one 1 oz glass of hard liquor (44 mL). General instructions  Schedule regular health, dental, and eye exams.  Stay current with your vaccines.  Tell your health care provider if: ? You often feel depressed. ? You have ever been abused or do not feel safe at home. Summary  Adopting a healthy lifestyle and getting preventive care are important in promoting health and wellness.  Follow your health care provider's instructions about healthy diet, exercising, and  getting tested or screened for diseases.  Follow your health care provider's instructions on monitoring your cholesterol and blood pressure. This information is not intended to replace advice given to you by your health care provider. Make sure you discuss any questions you have with your health care provider. Document Revised: 11/18/2018 Document Reviewed: 11/18/2018 Elsevier Patient Education  [place].  Hypertension, Adult High blood pressure (hypertension) is when the force of blood pumping through the arteries is too strong. The arteries are the blood vessels that carry blood from the heart throughout the body. Hypertension forces the heart to work harder to pump blood and may cause arteries to become narrow or stiff. Untreated or uncontrolled  hypertension can cause a heart attack, heart failure, a stroke, kidney disease, and other problems. A blood pressure reading consists of a higher number over a lower number. Ideally, your blood pressure should be below 120/80. The first ("top") number is called the systolic pressure. It is a measure of the pressure in your arteries as your heart beats. The second ("bottom") number is called the diastolic pressure. It is a measure of the pressure in your arteries as the heart relaxes. What are the causes? The exact cause of this condition is not known. There are some conditions that result in or are related to high blood pressure. What increases the risk? Some risk factors for high blood pressure are under your control. The following factors may make you more likely to develop this condition:  Smoking.  Having type 2 diabetes mellitus, high cholesterol, or both.  Not getting enough exercise or physical activity.  Being overweight.  Having too much fat, sugar, calories, or salt (sodium) in your diet.  Drinking too much alcohol. Some risk factors for high blood pressure may be difficult or impossible to change. Some of these factors  include:  Having chronic kidney disease.  Having a family history of high blood pressure.  Age. Risk increases with age.  Race. You may be at higher risk if you are African American.  Gender. Men are at higher risk than women before age 77. After age 51, women are at higher risk than men.  Having obstructive sleep apnea.  Stress. What are the signs or symptoms? High blood pressure may not cause symptoms. Very high blood pressure (hypertensive crisis) may cause:  Headache.  Anxiety.  Shortness of breath.  Nosebleed.  Nausea and vomiting.  Vision changes.  Severe chest pain.  Seizures. How is this diagnosed? This condition is diagnosed by measuring your blood pressure while you are seated, with your arm resting on a flat surface, your legs uncrossed, and your feet flat on the floor. The cuff of the blood pressure monitor will be placed directly against the skin of your upper arm at the level of your heart. It should be measured at least twice using the same arm. Certain conditions can cause a difference in blood pressure between your right and left arms. Certain factors can cause blood pressure readings to be lower or higher than normal for a short period of time:  When your blood pressure is higher when you are in a health care provider's office than when you are at home, this is called white coat hypertension. Most people with this condition do not need medicines.  When your blood pressure is higher at home than when you are in a health care provider's office, this is called masked hypertension. Most people with this condition may need medicines to control blood pressure. If you have a high blood pressure reading during one visit or you have normal blood pressure with other risk factors, you may be asked to:  Return on a different day to have your blood pressure checked again.  Monitor your blood pressure at home for 1 week or longer. If you are diagnosed with  hypertension, you may have other blood or imaging tests to help your health care provider understand your overall risk for other conditions. How is this treated? This condition is treated by making healthy lifestyle changes, such as eating healthy foods, exercising more, and reducing your alcohol intake. Your health care provider may prescribe medicine if lifestyle changes are not enough to get  your blood pressure under control, and if:  Your systolic blood pressure is above 130.  Your diastolic blood pressure is above 80. Your personal target blood pressure may vary depending on your medical conditions, your age, and other factors. Follow these instructions at home: Eating and drinking   Eat a diet that is high in fiber and potassium, and low in sodium, added sugar, and fat. An example eating plan is called the DASH (Dietary Approaches to Stop Hypertension) diet. To eat this way: ? Eat plenty of fresh fruits and vegetables. Try to fill one half of your plate at each meal with fruits and vegetables. ? Eat whole grains, such as whole-wheat pasta, brown rice, or whole-grain bread. Fill about one fourth of your plate with whole grains. ? Eat or drink low-fat dairy products, such as skim milk or low-fat yogurt. ? Avoid fatty cuts of meat, processed or cured meats, and poultry with skin. Fill about one fourth of your plate with lean proteins, such as fish, chicken without skin, beans, eggs, or tofu. ? Avoid pre-made and processed foods. These tend to be higher in sodium, added sugar, and fat.  Reduce your daily sodium intake. Most people with hypertension should eat less than 1,500 mg of sodium a day.  Do not drink alcohol if: ? Your health care provider tells you not to drink. ? You are pregnant, may be pregnant, or are planning to become pregnant.  If you drink alcohol: ? Limit how much you use to:  0-1 drink a day for women.  0-2 drinks a day for men. ? Be aware of how much alcohol is in  your drink. In the [place] one drink equals one 12 oz bottle of beer (355 mL), one 5 oz glass of wine (148 mL), or one 1 oz glass of hard liquor (44 mL). Lifestyle   Work with your health care provider to maintain a healthy body weight or to lose weight. Ask what an ideal weight is for you.  Get at least 30 minutes of exercise most days of the week. Activities may include walking, swimming, or biking.  Include exercise to strengthen your muscles (resistance exercise), such as Pilates or lifting weights, as part of your weekly exercise routine. Try to do these types of exercises for 30 minutes at least 3 days a week.  Do not use any products that contain nicotine or tobacco, such as cigarettes, e-cigarettes, and chewing tobacco. If you need help quitting, ask your health care provider.  Monitor your blood pressure at home as told by your health care provider.  Keep all follow-up visits as told by your health care provider. This is important. Medicines  Take over-the-counter and prescription medicines only as told by your health care provider. Follow directions carefully. Blood pressure medicines must be taken as prescribed.  Do not skip doses of blood pressure medicine. Doing this puts you at risk for problems and can make the medicine less effective.  Ask your health care provider about side effects or reactions to medicines that you should watch for. Contact a health care provider if you:  Think you are having a reaction to a medicine you are taking.  Have headaches that keep coming back (recurring).  Feel dizzy.  Have swelling in your ankles.  Have trouble with your vision. Get help right away if you:  Develop a severe headache or confusion.  Have unusual weakness or numbness.  Feel faint.  Have severe pain in your chest or abdomen.  Vomit repeatedly.  Have trouble breathing. Summary  Hypertension is when the force of blood pumping through your arteries is too strong.  If this condition is not controlled, it may put you at risk for serious complications.  Your personal target blood pressure may vary depending on your medical conditions, your age, and other factors. For most people, a normal blood pressure is less than 120/80.  Hypertension is treated with lifestyle changes, medicines, or a combination of both. Lifestyle changes include losing weight, eating a healthy, low-sodium diet, exercising more, and limiting alcohol. This information is not intended to replace advice given to you by your health care provider. Make sure you discuss any questions you have with your health care provider. Document Revised: 08/05/2018 Document Reviewed: 08/05/2018 Elsevier Patient Education  2020 Elsevier Inc.      Agustina Caroli, MD [facility]

## 2020-01-05 ENCOUNTER — Telehealth: Payer: Self-pay

## 2020-01-05 ENCOUNTER — Telehealth: Payer: Self-pay | Admitting: Neurology

## 2020-01-05 ENCOUNTER — Telehealth: Payer: Self-pay | Admitting: Emergency Medicine

## 2020-01-05 ENCOUNTER — Encounter (HOSPITAL_COMMUNITY): Payer: Self-pay

## 2020-01-05 ENCOUNTER — Other Ambulatory Visit: Payer: Self-pay

## 2020-01-05 ENCOUNTER — Emergency Department (HOSPITAL_COMMUNITY)
Admission: EM | Admit: 2020-01-05 | Discharge: 2020-01-05 | Disposition: A | Discharge status: Home / Self Care | Payer: Medicare HMO | Attending: Emergency Medicine | Admitting: Emergency Medicine

## 2020-01-05 DIAGNOSIS — I129 Hypertensive chronic kidney disease with stage 1 through stage 4 chronic kidney disease, or unspecified chronic kidney disease: Secondary | ICD-10-CM | POA: Insufficient documentation

## 2020-01-05 DIAGNOSIS — Z79899 Other long term (current) drug therapy: Secondary | ICD-10-CM | POA: Insufficient documentation

## 2020-01-05 DIAGNOSIS — G40919 Epilepsy, unspecified, intractable, without status epilepticus: Secondary | ICD-10-CM | POA: Insufficient documentation

## 2020-01-05 DIAGNOSIS — N183 Chronic kidney disease, stage 3 unspecified: Secondary | ICD-10-CM | POA: Insufficient documentation

## 2020-01-05 DIAGNOSIS — G40909 Epilepsy, unspecified, not intractable, without status epilepticus: Secondary | ICD-10-CM | POA: Diagnosis not present

## 2020-01-05 DIAGNOSIS — E1122 Type 2 diabetes mellitus with diabetic chronic kidney disease: Secondary | ICD-10-CM | POA: Insufficient documentation

## 2020-01-05 DIAGNOSIS — R569 Unspecified convulsions: Secondary | ICD-10-CM | POA: Diagnosis not present

## 2020-01-05 NOTE — Telephone Encounter (Signed)
Pt has been scheduled for 01/06/2020 with SS,NP.

## 2020-01-05 NOTE — Patient Outreach (Signed)
[facility]) Care Management  01/05/2020  AVACYN BJORGE 07/09/1971 [id number]   Telephone call to patient for disease management follow up. No answer. HIPAA compliant voice message left.  Plan: RN CM will contact patient in the month of February.  [name], RN, MSN [facility] Care Management Coordinator Direct Line [phone] Cell [phone] Toll Free: [phone]  Fax: [phone]

## 2020-01-05 NOTE — Telephone Encounter (Signed)
Got a note from the ER, reporting she had a breakthrough seizure, please call check on patient, make sure taking Lamictal 100 mg twice a day, did not miss any doses as recent procedure.  Also I referred her to [facility] for evaluation of epilepsy surgery, has she heard anything? If taking medication, I will likely increase, you can set her up for revisit with me, tomorrow even if she would like.

## 2020-01-05 NOTE — ED Triage Notes (Signed)
Pt BIB [facility] from home after being called out for pt having a seizure. Daughter saw pts feet and lips trembling and could not get pt to respond. Pt has hx of seizures and is compliant with medications. EMS reports per family, pt has frequent seizures that occur at night. EMS states it took pt around 20 min to come back around and answer all questions correctly. Pt is A&Ox4, did not void or defecate on self, nor any injury to mouth.   Pt has hx of CVA, weakness on left side.

## 2020-01-05 NOTE — ED Notes (Signed)
Pt said to call [name] Benton's cell [phone]. Called but no answer. Will continue to attempt contact.

## 2020-01-05 NOTE — Telephone Encounter (Signed)
Requesting referral for cpap  Pt had a seizure this morning that put patient through sleep apnea and she was instructed to get a referral for treatment   Please advise

## 2020-01-05 NOTE — ED Provider Notes (Signed)
[facility] DEPT Provider Note   CSN: [id number] Arrival date & time: 01/05/20  0209   History Chief Complaint  Patient presents with  . Seizures    Brandi Gomez is a 49 y.o. female.  The history is provided by the patient.  Seizures She has history of seizure disorder, hypertension, diabetes, cryptogenic stroke, combined systolic and diastolic heart failure and is brought in by ambulance after having had a seizure at home.  She currently takes lamotrigine 100 mg twice a day for seizures and states that she has been compliant.  Reportedly, her daughter saw patients lips and feet were trembling and was not responsive and it took about 20 minutes for her to regain normal mentation.  Patient does remember waking up feeling slightly confused but feels she is back to her baseline.  There was no bit lip or tongue and no incontinence.  Past Medical History:  Diagnosis Date  . Anemia   . Anxiety   . Bipolar 1 disorder ([place])   . Common migraine 05/19/2015  . Depression   . Diabetes mellitus, type II ([place])   . Hypertension   . Irritable bowel syndrome (IBS)   . Mild mental retardation   . Obesity   . Partial complex seizure disorder with intractable epilepsy ([place]) 05/12/2014  . Seizures ([place])    intractable, sz 08/23/17  . Sleep apnea   . Stroke ([place])   . Type II or unspecified type diabetes mellitus without mention of complication, not stated as uncontrolled     Patient Active Problem List   Diagnosis Date Noted  . Ischemic cerebrovascular accident (CVA) of frontal lobe ([place]) 10/15/2019  . Acute CVA (cerebrovascular accident) ([place]) 10/12/2019  . HLD (hyperlipidemia) 10/12/2019  . CKD (chronic kidney disease) stage 3, GFR 30-59 ml/min 10/12/2019  . Acute ischemic stroke ([place]) 10/12/2019  . Chronic combined systolic and diastolic heart failure ([place])   . Depression 07/14/2019  . Bipolar disorder ([place]) 05/13/2019  . Bipolar 1 disorder ([place])  05/13/2019  . Adjustment disorder with anxiety   . History of recent stroke 08/06/2017  . OSA (obstructive sleep apnea)   . Cryptogenic stroke ([place]) 05/20/2017  . Controlled type 2 diabetes mellitus without complication, without long-term current use of insulin ([place]) 05/08/2017  . Essential hypertension 07/27/2008  . Seizure disorder ([place]) 02/05/2007    Past Surgical History:  Procedure Laterality Date  . BUBBLE STUDY  11/15/2019   Procedure: BUBBLE STUDY;  Surgeon: Dorothy Spark, MD;  Location: [facility];  Service: Cardiovascular;;  . COLONOSCOPY     2012-normal , Dr Sharlett Iles  . ESOPHAGOGASTRODUODENOSCOPY     normal-Dr Patterson 2012  . LOOP RECORDER INSERTION N/A 05/30/2017   Procedure: Loop Recorder Insertion;  Surgeon: Constance Haw, MD;  Location: [facility];  Service: Cardiovascular;  Laterality: N/A;  . LOOP RECORDER REMOVAL N/A 03/04/2018   Procedure: LOOP RECORDER REMOVAL;  Surgeon: Constance Haw, MD;  Location: [facility];  Service: Cardiovascular;  Laterality: N/A;  . MYRINGOTOMY WITH TUBE PLACEMENT    . MYRINGOTOMY WITH TUBE PLACEMENT Right 11/05/2017   Procedure: MYRINGOTOMY WITH TUBE PLACEMENT;  Surgeon: Melissa Montane, MD;  Location: [facility];  Service: ENT;  Laterality: Right;  right T Tube placement  . NASAL SINUS SURGERY    . TEE WITHOUT CARDIOVERSION N/A 05/30/2017   Procedure: TRANSESOPHAGEAL ECHOCARDIOGRAM (TEE);  Surgeon: Acie Fredrickson Wonda Cheng, MD;  Location: [facility];  Service: Cardiovascular;  Laterality: N/A;  .  TEE WITHOUT CARDIOVERSION N/A 11/15/2019   Procedure: TRANSESOPHAGEAL ECHOCARDIOGRAM (TEE);  Surgeon: Dorothy Spark, MD;  Location: [facility] ENDOSCOPY;  Service: Cardiovascular;  Laterality: N/A;     OB History   No obstetric history on file.     Family History  Problem Relation Age of Onset  . Diabetes Mother        passed away from accidental death  . Hypertension Mother   . Bipolar disorder Father   . Diabetes  Daughter   . Leukemia Daughter   . Ovarian cancer Maternal Aunt     Social History   Tobacco Use  . Smoking status: Never Smoker  . Smokeless tobacco: Never Used  Substance Use Topics  . Alcohol use: No  . Drug use: No    Home Medications Prior to Admission medications   Medication Sig Start Date End Date Taking? Authorizing Provider  acetaminophen (TYLENOL) 325 MG tablet Take 2 tablets (650 mg total) by mouth every 4 (four) hours as needed for mild pain (or temp > 37.5 C (99.5 F)). 10/19/19  Yes Angiulli, Lavon Paganini, PA-C  atorvastatin (LIPITOR) 40 MG tablet Take 1 tablet (40 mg total) by mouth daily at 6 PM. 01/04/20 04/03/20 Yes Sagardia, Ines Bloomer, MD  carvedilol (COREG) 3.125 MG tablet Take 1 tablet (3.125 mg total) by mouth 2 (two) times daily. 01/04/20 04/03/20 Yes Sagardia, Ines Bloomer, MD  hydrALAZINE (APRESOLINE) 10 MG tablet Take 1 tablet (10 mg total) by mouth 2 (two) times daily. 01/04/20 04/03/20 Yes Sagardia, Ines Bloomer, MD  isosorbide dinitrate (ISORDIL) 10 MG tablet Take 1 tablet (10 mg total) by mouth 2 (two) times daily. 01/04/20 04/03/20 Yes Sagardia, Ines Bloomer, MD  lamoTRIgine (LAMICTAL) 100 MG tablet Take 1 tablet (100 mg total) by mouth 2 (two) times daily. 01/04/20 04/03/20 Yes Sagardia, Ines Bloomer, MD  cephALEXin (KEFLEX) 500 MG capsule Take 1 capsule (500 mg total) by mouth 2 (two) times daily for 5 days. 01/04/20 01/09/20  Vanessa Kick, MD  ARIPiprazole (ABILIFY) 5 MG tablet Take 1 tablet (5 mg total) by mouth daily. Patient not taking: Reported on 12/29/2019 11/12/19 01/01/20  Norma Fredrickson, MD  methylphenidate (RITALIN) 5 MG tablet Take 1 tablet (5 mg total) by mouth 2 (two) times daily. Patient not taking: Reported on 12/29/2019 10/29/19 01/01/20  Izora Ribas, MD  torsemide (DEMADEX) 10 MG tablet Take 1 tablet (10 mg total) by mouth daily. Patient not taking: Reported on 12/29/2019 10/19/19 01/01/20  Angiulli, Lavon Paganini, PA-C  traZODone (DESYREL) 100 MG tablet  Take 1 tablet (100 mg total) by mouth at bedtime. Patient not taking: Reported on 12/29/2019 10/19/19 01/01/20  Angiulli, Lavon Paganini, PA-C    Allergies    Amoxicillin, Lisinopril, Hydrocodone, and Tegretol [carbamazepine]  Review of Systems   Review of Systems  Neurological: Positive for seizures.  All other systems reviewed and are negative.   Physical Exam Updated Vital Signs BP 129/66   Pulse 71   Temp 98.1 F (36.7 C)   Resp (!) 27   Ht 5\' 1"  (1.549 m)   Wt 127.9 kg   LMP 12/15/2019 (Approximate)   SpO2 100%   BMI 53.28 kg/m   Physical Exam Vitals and nursing note reviewed.   49 year old female, resting comfortably and in no acute distress. Vital signs are significant for rapid respiratory rate. Oxygen saturation is 100%, which is normal. Head is normocephalic and atraumatic. PERRLA, EOMI. Oropharynx is clear. Neck is nontender and supple without adenopathy or JVD.  Back is nontender and there is no CVA tenderness. Lungs are clear without rales, wheezes, or rhonchi. Chest is nontender. Heart has regular rate and rhythm without murmur. Abdomen is soft, flat, nontender without masses or hepatosplenomegaly and peristalsis is normoactive. Extremities have 1+ edema, full range of motion is present. Skin is warm and dry without rash. Neurologic: Mental status is normal, cranial nerves are intact, there are no motor or sensory deficits.  ED Results / Procedures / Treatments    Procedures Procedures  Medications Ordered in ED Medications - No data to display  ED Course  I have reviewed the triage vital signs and the nursing notes.  MDM Rules/Calculators/A&P Breakthrough seizure.  Old records reviewed, and she is followed by neurology for partial complex seizure disorder with intractable epilepsy who was unable to tolerate Keppra, Tegretol, Vimpat.  She was supposed to be referred to [facility] for evaluation of possible epilepsy surgery.  Will  observe in the emergency department.  No indication for blood work as her medication has not 1 where blood levels are available.  Patient was observed in the emergency department for 2 hours with no further seizure activity.  She is felt to be safe for discharge and is referred back to her neurologist for medication adjustment and consideration on following through with the referral to [facility].  Final Clinical Impression(s) / ED Diagnoses Final diagnoses:  Breakthrough seizure [facility])    Rx / DC Orders ED Discharge Orders    None       Delora Fuel, MD [id number] [phone]

## 2020-01-05 NOTE — Telephone Encounter (Signed)
-----   Message from [name], MD sent at 01/05/2020 11:08 AM EST ----- This patient was seen in the ER recently for seizures, she will need a revisit sometime in the next couple weeks, may see me or Judson Roch.  Thank you.

## 2020-01-05 NOTE — Telephone Encounter (Signed)
Pt has appt tomorrow,  Will discuss then.  Pt verbalized understanding.

## 2020-01-05 NOTE — ED Notes (Signed)
Pt said to call [name] at [phone]. He will pick her up. Attempted to call but no answer.

## 2020-01-06 ENCOUNTER — Ambulatory Visit (INDEPENDENT_AMBULATORY_CARE_PROVIDER_SITE_OTHER): Payer: Medicare HMO | Admitting: Neurology

## 2020-01-06 ENCOUNTER — Telehealth: Payer: Self-pay | Admitting: Neurology

## 2020-01-06 ENCOUNTER — Encounter: Payer: Self-pay | Admitting: Neurology

## 2020-01-06 VITALS — BP 137/77 | HR 67 | Temp 96.9°F | Ht 61.0 in | Wt 280.0 lb

## 2020-01-06 DIAGNOSIS — G40909 Epilepsy, unspecified, not intractable, without status epilepticus: Secondary | ICD-10-CM

## 2020-01-06 DIAGNOSIS — G4733 Obstructive sleep apnea (adult) (pediatric): Secondary | ICD-10-CM | POA: Diagnosis not present

## 2020-01-06 MED ORDER — LAMOTRIGINE 100 MG PO TABS: ORAL_TABLET | ORAL | 3 refills | Status: DC

## 2020-01-06 NOTE — Patient Instructions (Addendum)
Let's increase your Lamictal, please take 100 mg in the morning, 150 mg (1.5 tablets) at bedtime x 2 weeks, then take 150 mg twice daily (1.5 tablets twice daily)  Please contact [facility] to discuss referral, call this # to check on status of referral [phone]  Call for recurrent seizure

## 2020-01-06 NOTE — Telephone Encounter (Signed)
Patient was last seen for CPAP follow up in march 2020. Appears she is established with [facility]. Pt has a up to date order on file and should be getting her supplies from [facility] as she mentioned in her visit today. Pt is not eligible for a new machine because the one she has was just set up 12/15/2018. Insurance will only cover a new machine ever 5 yrs. As long as she continues to be compliant the patient will get the supplies with no problems. Patient has to follow yearly for her CPAP here in the office so that we may document compliance. Per discussed in visit with Judson Roch the patient is waiting for [facility] to send new supplies.

## 2020-01-06 NOTE — Telephone Encounter (Signed)
Please Advise. Pt is requesting a referral for a cpap machine.

## 2020-01-06 NOTE — Telephone Encounter (Signed)
Call patient please.  She has an appointment with [facility] tomorrow 01/06/2020.  They also specialize in sleep apnea-sleep related disorders so she should talk to them about her sleep apnea and how to get her CPAP equipment.  Thanks.

## 2020-01-06 NOTE — Progress Notes (Signed)
PATIENT: Brandi Gomez DOB: Aug 26, 1971  REASON FOR VISIT: follow up HISTORY FROM: patient  HISTORY OF PRESENT ILLNESS: Today 01/06/20  Brandi Gomez is a 49 year old female with history of seizure disorder.  She went to the ER yesterday 01/05/2020 after seizure. She was taking Lamictal 100 mg twice a day, and reports compliance.  As you are currently, her daughter went to check on him, her daughter reported her lips and feet were trembling, she was not responsive, it took about 20 minutes for her to regain her baseline.  There was no oral injury or incontinence. She does mention she uses her CPAP every night, ordered new masks, and is waiting for them to arrive. She has been compliant with the medication, takes at 10 AM, and 10 PM.  She has recently been diagnosed with UTI, has not started the antibiotic yet.  Is going to pick it up today.  She presents today for follow-up unaccompanied.  Recent lab work 01/04/2020 A1c 6.2, creatinine 1.35.  HISTORY 12/29/2019 SS: Brandi Gomez is a 49 year old female with history of intractable seizure disorder. She was last seen in the office 07/14/2019, at that time she was increasing her Lamictal dose for seizure prevention.  She went to the emergency room, due to significant depression.  She went to the ER 8/17 for oral swelling, 9/19 for seizure while sleeping, was not taking proper dose of seizure medication.  She went to the ER 11/2 for weakness while walking to her apartment to get, her right leg became weak and she fell, MRI of the brain revealed acute 12 mm left ACA territory ischemic infarction.  She was started on aspirin 81 mg daily, increase atorvastatin 40 mg daily.  MRI was unremarkable, echo showed EF 35-40%, LDL 64, A1c 6.5.  She had a loop recorder placed June 2018, no A. fib was found, removed March 2019 due to patient request.  EEG was done while hospitalized, due to 2 breakthrough seizures, to have diffuse mild encephalopathy, no seizures or  epileptiform discharges were seen.  She was loaded with IV Keppra.  Lamictal was increased to 100 mg twice a day.  Overnight EEG was done, evidence of epileptogenicity arising from the left and right frontal region.  It was determined by Dr. Hortense Ramal, she may be a candidate for epilepsy surgery.  She was discharged home, did not show for revisit 11/03/2019 with Dr. Jannifer Franklin.  She has been seen by Dr. Oval Linsey with cardiology for worsening systolic function.  Has not been seen by PCP for hospital follow-up.  She is here today with her significant other Brandi Gomez.  She reports she has been off most of her medications for several weeks.  She has remained on Lamictal, is taking 100 mg twice a day.  Fortunately, she has not had recurrent seizure.  She continues to use CPAP every night.  As result of the stroke, she says she has some weakness to left hand when holding objects (no weakness to right side).  She says she completed physical therapy at her home.  She says she is feeling more depressed, taken off depression medications due to feeling better.. Discouraged she is not able to get out much, and has been eating more.   REVIEW OF SYSTEMS: Out of a complete 14 system review of symptoms, the patient complains only of the following symptoms, and all other reviewed systems are negative.  Seizure  ALLERGIES: Allergies  Allergen Reactions  . Amoxicillin Itching    Did it involve  swelling of the face/tongue/throat, SOB, or low BP? No Did it involve sudden or severe rash/hives, skin peeling, or any reaction on the inside of your mouth or nose? No Did you need to seek medical attention at a hospital or doctor's office? No When did it last happen?within the past 10 years If all above answers are "NO", may proceed with cephalosporin use.   Marland Kitchen Lisinopril Swelling    Angioedema   . Hydrocodone Other (See Comments)    Depressed   . Tegretol [Carbamazepine] Swelling    Throat swells    HOME  MEDICATIONS: Outpatient Medications Prior to Visit  Medication Sig Dispense Refill  . acetaminophen (TYLENOL) 325 MG tablet Take 2 tablets (650 mg total) by mouth every 4 (four) hours as needed for mild pain (or temp > 37.5 C (99.5 F)).    Marland Kitchen atorvastatin (LIPITOR) 40 MG tablet Take 1 tablet (40 mg total) by mouth daily at 6 PM. 90 tablet 3  . carvedilol (COREG) 3.125 MG tablet Take 1 tablet (3.125 mg total) by mouth 2 (two) times daily. 180 tablet 3  . isosorbide dinitrate (ISORDIL) 10 MG tablet Take 1 tablet (10 mg total) by mouth 2 (two) times daily. 180 tablet 3  . lamoTRIgine (LAMICTAL) 100 MG tablet Take 1 tablet (100 mg total) by mouth 2 (two) times daily. 180 tablet 3  . cephALEXin (KEFLEX) 500 MG capsule Take 1 capsule (500 mg total) by mouth 2 (two) times daily for 5 days. 10 capsule 0  . hydrALAZINE (APRESOLINE) 10 MG tablet Take 1 tablet (10 mg total) by mouth 2 (two) times daily. 180 tablet 3   No facility-administered medications prior to visit.    PAST MEDICAL HISTORY: Past Medical History:  Diagnosis Date  . Anemia   . Anxiety   . Bipolar 1 disorder ([place])   . Common migraine 05/19/2015  . Depression   . Diabetes mellitus, type II ([place])   . Hypertension   . Irritable bowel syndrome (IBS)   . Mild mental retardation   . Obesity   . Partial complex seizure disorder with intractable epilepsy ([place]) 05/12/2014  . Seizures ([place])    intractable, sz 08/23/17  . Sleep apnea   . Stroke ([place])   . Type II or unspecified type diabetes mellitus without mention of complication, not stated as uncontrolled     PAST SURGICAL HISTORY: Past Surgical History:  Procedure Laterality Date  . BUBBLE STUDY  11/15/2019   Procedure: BUBBLE STUDY;  Surgeon: Dorothy Spark, MD;  Location: [facility];  Service: Cardiovascular;;  . COLONOSCOPY     2012-normal , Dr Sharlett Iles  . ESOPHAGOGASTRODUODENOSCOPY     normal-Dr Patterson 2012  . LOOP RECORDER INSERTION N/A 05/30/2017   Procedure:  Loop Recorder Insertion;  Surgeon: Constance Haw, MD;  Location: [facility];  Service: Cardiovascular;  Laterality: N/A;  . LOOP RECORDER REMOVAL N/A 03/04/2018   Procedure: LOOP RECORDER REMOVAL;  Surgeon: Constance Haw, MD;  Location: [facility];  Service: Cardiovascular;  Laterality: N/A;  . MYRINGOTOMY WITH TUBE PLACEMENT    . MYRINGOTOMY WITH TUBE PLACEMENT Right 11/05/2017   Procedure: MYRINGOTOMY WITH TUBE PLACEMENT;  Surgeon: Melissa Montane, MD;  Location: [facility];  Service: ENT;  Laterality: Right;  right T Tube placement  . NASAL SINUS SURGERY    . TEE WITHOUT CARDIOVERSION N/A 05/30/2017   Procedure: TRANSESOPHAGEAL ECHOCARDIOGRAM (TEE);  Surgeon: Acie Fredrickson Wonda Cheng, MD;  Location: [facility];  Service: Cardiovascular;  Laterality: N/A;  . TEE WITHOUT CARDIOVERSION N/A 11/15/2019   Procedure: TRANSESOPHAGEAL ECHOCARDIOGRAM (TEE);  Surgeon: Dorothy Spark, MD;  Location: [facility];  Service: Cardiovascular;  Laterality: N/A;    FAMILY HISTORY: Family History  Problem Relation Age of Onset  . Diabetes Mother        passed away from accidental death  . Hypertension Mother   . Bipolar disorder Father   . Diabetes Daughter   . Leukemia Daughter   . Ovarian cancer Maternal Aunt     SOCIAL HISTORY: Social History   Socioeconomic History  . Marital status: Single    Spouse name: Brandi Gomez  . Number of children: 3  . Years of education: 50  . Highest education level: Not on file  Occupational History  . Occupation: disabled    Employer: DISABLED  Tobacco Use  . Smoking status: Never Smoker  . Smokeless tobacco: Never Used  Substance and Sexual Activity  . Alcohol use: No  . Drug use: No  . Sexual activity: Never  Other Topics Concern  . Not on file  Social History Narrative   Patient lives at home with daughter.    Patient has 3 children.    Patient is right handed.    Patient has a high school education.    Patient is on disability    Patient drinks 2 cups of caffeine daily.   Social Determinants of Health   Financial Resource Strain:   . Difficulty of Paying Living Expenses: Not on file  Food Insecurity: No Food Insecurity  . Worried About Charity fundraiser in the Last Year: Never true  . Ran Out of Food in the Last Year: Never true  Transportation Needs: No Transportation Needs  . Lack of Transportation (Medical): No  . Lack of Transportation (Non-Medical): No  Physical Activity:   . Days of Exercise per Week: Not on file  . Minutes of Exercise per Session: Not on file  Stress:   . Feeling of Stress : Not on file  Social Connections:   . Frequency of Communication with Friends and Family: Not on file  . Frequency of Social Gatherings with Friends and Family: Not on file  . Attends Religious Services: Not on file  . Active Member of Clubs or Organizations: Not on file  . Attends Archivist Meetings: Not on file  . Marital Status: Not on file  Intimate Partner Violence:   . Fear of Current or Ex-Partner: Not on file  . Emotionally Abused: Not on file  . Physically Abused: Not on file  . Sexually Abused: Not on file   PHYSICAL EXAM  Vitals:   01/06/20 1319  BP: 137/77  Pulse: 67  Temp: (!) 96.9 F (36.1 C)  Weight: 280 lb (127 kg)  Height: 5\' 1"  (1.549 m)   Body mass index is 52.91 kg/m.  Generalized: Well developed, in no acute distress   Neurological examination  Mentation: Alert oriented to time, place, history taking. Follows all commands speech and language fluent Cranial nerve II-XII: Pupils were equal round reactive to light. Extraocular movements were full, visual field were full on confrontational test. Facial sensation and strength were normal.  Head turning and shoulder shrug  were normal and symmetric. Motor: The motor testing reveals 5 over 5 strength of all 4 extremities. Good symmetric motor tone is noted throughout.  Sensory: Sensory testing is intact to soft touch on  all 4 extremities. No evidence of extinction is noted.  Coordination:  Cerebellar testing reveals good finger-nose-finger and heel-to-shin bilaterally.  Gait and station: Gait is normal. Tandem gait is mildly unsteady. Reflexes: Deep tendon reflexes are symmetric and normal bilaterally.   DIAGNOSTIC DATA (LABS, IMAGING, TESTING) - I reviewed patient records, labs, notes, testing and imaging myself where available.  Lab Results  Component Value Date   WBC 7.1 11/10/2019   HGB 9.7 (L) 11/10/2019   HCT 30.0 (L) 11/10/2019   MCV 88 11/10/2019   PLT 340 11/10/2019      Component Value Date/Time   NA 138 01/04/2020 1125   K 4.6 01/04/2020 1125   CL 103 01/04/2020 1125   CO2 20 01/04/2020 1125   GLUCOSE 107 (H) 01/04/2020 1125   GLUCOSE 129 (H) 10/18/2019 0638   BUN 15 01/04/2020 1125   CREATININE 1.35 (H) 01/04/2020 1125   CREATININE 0.72 01/30/2015 1241   CALCIUM 8.8 01/04/2020 1125   PROT 7.4 01/04/2020 1125   ALBUMIN 4.0 01/04/2020 1125   AST 15 01/04/2020 1125   ALT 12 01/04/2020 1125   ALKPHOS 106 01/04/2020 1125   BILITOT 0.3 01/04/2020 1125   GFRNONAA 46 (L) 01/04/2020 1125   GFRNONAA >89 01/30/2015 1241   GFRAA 53 (L) 01/04/2020 1125   GFRAA >89 01/30/2015 1241   Lab Results  Component Value Date   CHOL 123 10/12/2019   HDL 49 10/12/2019   LDLCALC 64 10/12/2019   TRIG 49 10/12/2019   CHOLHDL 2.5 10/12/2019   Lab Results  Component Value Date   HGBA1C 6.2 (H) 01/04/2020   Lab Results  Component Value Date   VITAMINB12 379 05/13/2014   Lab Results  Component Value Date   TSH 1.220 02/10/2018    ASSESSMENT AND PLAN 49 y.o. year old female  has a past medical history of Anemia, Anxiety, Bipolar 1 disorder ([place]), Common migraine (05/19/2015), Depression, Diabetes mellitus, type II ([place]), Hypertension, Irritable bowel syndrome (IBS), Mild mental retardation, Obesity, Partial complex seizure disorder with intractable epilepsy ([place]) (05/12/2014), Seizures ([place]),  Sleep apnea, Stroke ([place]), and Type II or unspecified type diabetes mellitus without mention of complication, not stated as uncontrolled. here with:  1.  Seizure, most recent 01/05/2020  She had a recent seizure, while taking Lamictal 100 mg twice daily.  She has been compliant with medication, taking every day at 10 AM, and 10 PM.  I will check a Lamictal level, I will increase the medication.  She will take 100 mg in the morning, 150 mg at bedtime x2 weeks, then take 150 mg twice a day.  I have asked her to contact [facility] because of her referral for possible epilepsy surgery [phone]).  She is not sure she wants the procedure, but she is willing to have a consultation.  She will call for recurrent seizure, otherwise keep follow-up appointment Dr. Jannifer Franklin in May.  I spent 15 minutes with the patient. 50% of this time was spent in plan of care.  Butler Denmark, AGNP-C, DNP 01/06/2020, 1:28 PM [facility] [address] [place], [place] [address] [phone]

## 2020-01-06 NOTE — Telephone Encounter (Signed)
Can you check on this? Patient uses CPAP, please see prior note today from PCP, asking for assistance in getting patient supplies, patient did not mention this to me, told me she ordered masks and is waiting for them?

## 2020-01-07 LAB — LAMOTRIGINE LEVEL: Lamotrigine Lvl: 5.6 ug/mL (ref 2.0–20.0)

## 2020-01-07 NOTE — Progress Notes (Signed)
I have read the note, and I agree with the clinical assessment and plan.  Brandi Gomez   

## 2020-01-10 ENCOUNTER — Telehealth: Payer: Self-pay

## 2020-01-10 NOTE — Telephone Encounter (Signed)
Called and spoke with the pt directly to go over recent labs results, per NP Butler Denmark. Pt did not have any questions nor concerns and demonstrated understanding.   "Please call the patient. Brandi Gomez, is in therapeutic range, continue with dose increase due to recurrent seizure." -NP SS

## 2020-01-11 NOTE — Telephone Encounter (Signed)
Spoke to pt and advised her that sh needs to contact Brandi Gomez regarding her Cpap machine and she verbalize understanding.

## 2020-01-12 ENCOUNTER — Other Ambulatory Visit: Payer: Self-pay | Admitting: Emergency Medicine

## 2020-01-12 DIAGNOSIS — I10 Essential (primary) hypertension: Secondary | ICD-10-CM

## 2020-01-12 DIAGNOSIS — G4700 Insomnia, unspecified: Secondary | ICD-10-CM

## 2020-01-12 NOTE — Telephone Encounter (Signed)
Dr Mitchel Honour I see this rx was discontinued below on 09/22/19. In Your office note you do not mention this med is it ok to refill this medication at this time? Copy of your note below:  Essential hypertension-BP today 126/82. Continue carvedilol 3.125 mg twice daily Continue hydralazine 10 mg twice daily Continue isosorbide dinitrate 10 mg tablet twice daily Heart healthy low-sodium diet Increase physical activity as tolerated-goal 20 minutes 5 days a week   Disposition: Follow-up with Dr. Oval Linsey in 3 months.

## 2020-01-12 NOTE — Telephone Encounter (Signed)
Forwarding medication refill request to the clinical pool for review. 

## 2020-01-12 NOTE — Telephone Encounter (Signed)
No. She should not be on this medication. We talked about this during our last visit.

## 2020-01-25 ENCOUNTER — Other Ambulatory Visit (HOSPITAL_COMMUNITY): Payer: Self-pay | Admitting: *Deleted

## 2020-01-25 ENCOUNTER — Ambulatory Visit (HOSPITAL_COMMUNITY)
Admission: RE | Admit: 2020-01-25 | Discharge: 2020-01-25 | Disposition: A | Discharge status: Home / Self Care | Payer: Medicare HMO | Attending: Psychiatry | Admitting: Psychiatry

## 2020-01-25 ENCOUNTER — Other Ambulatory Visit: Payer: Self-pay

## 2020-01-25 ENCOUNTER — Observation Stay (HOSPITAL_COMMUNITY): Admission: RE | Admit: 2020-01-25 | Payer: Medicare HMO | Source: Home / Self Care | Admitting: Psychiatry

## 2020-01-25 DIAGNOSIS — E119 Type 2 diabetes mellitus without complications: Secondary | ICD-10-CM | POA: Diagnosis not present

## 2020-01-25 DIAGNOSIS — F79 Unspecified intellectual disabilities: Secondary | ICD-10-CM | POA: Insufficient documentation

## 2020-01-25 DIAGNOSIS — Z885 Allergy status to narcotic agent status: Secondary | ICD-10-CM | POA: Diagnosis not present

## 2020-01-25 DIAGNOSIS — Z888 Allergy status to other drugs, medicaments and biological substances status: Secondary | ICD-10-CM | POA: Diagnosis not present

## 2020-01-25 DIAGNOSIS — Z883 Allergy status to other anti-infective agents status: Secondary | ICD-10-CM | POA: Diagnosis not present

## 2020-01-25 DIAGNOSIS — Z8673 Personal history of transient ischemic attack (TIA), and cerebral infarction without residual deficits: Secondary | ICD-10-CM | POA: Diagnosis not present

## 2020-01-25 DIAGNOSIS — Z8249 Family history of ischemic heart disease and other diseases of the circulatory system: Secondary | ICD-10-CM | POA: Diagnosis not present

## 2020-01-25 DIAGNOSIS — F419 Anxiety disorder, unspecified: Secondary | ICD-10-CM | POA: Diagnosis not present

## 2020-01-25 DIAGNOSIS — I10 Essential (primary) hypertension: Secondary | ICD-10-CM | POA: Diagnosis not present

## 2020-01-25 DIAGNOSIS — Z8041 Family history of malignant neoplasm of ovary: Secondary | ICD-10-CM | POA: Insufficient documentation

## 2020-01-25 DIAGNOSIS — Z806 Family history of leukemia: Secondary | ICD-10-CM | POA: Insufficient documentation

## 2020-01-25 DIAGNOSIS — E669 Obesity, unspecified: Secondary | ICD-10-CM | POA: Insufficient documentation

## 2020-01-25 DIAGNOSIS — G4730 Sleep apnea, unspecified: Secondary | ICD-10-CM | POA: Diagnosis not present

## 2020-01-25 DIAGNOSIS — Z880 Allergy status to penicillin: Secondary | ICD-10-CM | POA: Insufficient documentation

## 2020-01-25 DIAGNOSIS — Z818 Family history of other mental and behavioral disorders: Secondary | ICD-10-CM | POA: Diagnosis not present

## 2020-01-25 DIAGNOSIS — F319 Bipolar disorder, unspecified: Secondary | ICD-10-CM | POA: Diagnosis not present

## 2020-01-25 MED ORDER — ARIPIPRAZOLE 2 MG PO TABS: 2.0000 mg | ORAL_TABLET | Freq: Every day | ORAL | 0 refills | Status: DC

## 2020-01-25 NOTE — Patient Outreach (Signed)
[facility]) Care Management  01/25/2020  Brandi Gomez [birth year] [id number]   Telephone call to patient for disease management follow up.  No answer.  HIPAA compliant voice message left.    Plan: RN CM will attempt patient again in the month of March.  [name], RN, MSN [facility] Care Management Coordinator Direct Line [phone] Cell [phone] Toll Free: [phone]  Fax: [phone]

## 2020-01-25 NOTE — H&P (Signed)
Behavioral Health Medical Screening Exam  Brandi Gomez is an 49 y.o. female.who presented to [facility] as a walk-in, voluntarily. Patient is well known to the mental health system. She has presented to [facility] multiple times for a psychiatric assessment. Today,she reported that she came to behavioral health because she has felt more depressed and anxious secondary to the pandemic. She denies SI, HI or AVH.  Reported she is currently seeing Dr. Casimiro Needle, who manges her psychiatric medications. Report she called Dr. Casimiro Needle office today to speak with him about her worsening  depression and anxiety although she had to leave a message and had not received a return call. Reported she was hoping that she could receive medication from [facility]. Stated, she has an appointment with Dr. Casimiro Needle on 02/03/2020. She does not appear to be in distress. Denies substance abuse or use.     Total Time spent with patient: 20 minutes  Psychiatric Specialty Exam: Physical Exam  Nursing note and vitals reviewed. Constitutional: She is oriented to person, place, and time.  Neurological: She is alert and oriented to person, place, and time.    Review of Systems  Psychiatric/Behavioral:       Depression  Anxiety   All other systems reviewed and are negative.   There were no vitals taken for this visit.There is no height or weight on file to calculate BMI.  General Appearance: Fairly Groomed  Eye Contact:  Good  Speech:  Clear and Coherent and Normal Rate  Volume:  Normal  Mood:  Anxious  Affect:  Appropriate  Thought Process:  Coherent, Linear and Descriptions of Associations: Intact  Orientation:  Full (Time, Place, and Person)  Thought Content:  Logical  Suicidal Thoughts:  No  Homicidal Thoughts:  No  Memory:  Immediate;   Fair Recent;   Fair  Judgement:  Fair  Insight:  Fair  Psychomotor Activity:  Normal  Concentration: Concentration: Fair and Attention Span: Fair  Recall:  [employer] of Knowledge:Fair   Language: Good  Akathisia:  Negative  Handed:  Right  AIMS (if indicated):     Assets:  Communication Skills Desire for Improvement Resilience  Sleep:       Musculoskeletal: Strength & Muscle Tone: within normal limits Gait & Station: normal Patient leans: N/A  There were no vitals taken for this visit.  Recommendations:  Based on my evaluation the patient does not appear to have an emergency medical condition.   No evidence of imminent risk to self or others at present.   Patient does not meet criteria for psychiatric inpatient admission. Reccommended to continue follow-up with Dr. Casimiro Needle to discuss depression and anxiety as well as medication evaluation.     [name], NP 01/25/2020, 3:54 PM

## 2020-01-25 NOTE — BH Assessment (Signed)
Assessment Note  Brandi Gomez is an 49 y.o. female with history of Bipolar I Disorder, anxiety, and mild mental retardation. Patient presents to [facility] stating, "I am having issues adjusting to the pandemic". Patient states that everything associated with the pandemic is taking a toll on her. She reports wanting normalcy without having to take the COVID precautions. She reports increased depression associated with loss of please in usual activities and isolating self from others. She also has an increase in anxiety. She denies SI. No history of self harm. No HI. No history of aggressive behaviors. No AVH's. No alcohol and/or drug use. She is under the care of psychiatrist (Dr. Zachery Dauer) and has an upcoming appointment with him 02/03/20. Patient has a history of inpatient treatment at [facility]. States that her admission was over 10 yrs ago. She lives with her daughter and a friend. Patient reports that she is sleeping well. She also reports a good appetite. She was calm and cooperative during the assessment. Her affect and mood was appropriate. She was oriented to person, time, place, and situation. She was dressed appropriately. Insight and judgement were appropriate.   Diagnosis:  Bipolar I Disorder, Anxiety, and Mild Mental Retardation  Past Medical History:  Past Medical History:  Diagnosis Date  . Anemia   . Anxiety   . Bipolar 1 disorder ([place])   . Common migraine 05/19/2015  . Depression   . Diabetes mellitus, type II ([place])   . Hypertension   . Irritable bowel syndrome (IBS)   . Mild mental retardation   . Obesity   . Partial complex seizure disorder with intractable epilepsy ([place]) 05/12/2014  . Seizures ([place])    intractable, sz 08/23/17  . Sleep apnea   . Stroke ([place])   . Type II or unspecified type diabetes mellitus without mention of complication, not stated as uncontrolled     Past Surgical History:  Procedure Laterality Date  . BUBBLE STUDY  11/15/2019   Procedure: BUBBLE STUDY;   Surgeon: Dorothy Spark, MD;  Location: [place];  Service: Cardiovascular;;  . COLONOSCOPY     2012-normal , Dr Sharlett Iles  . ESOPHAGOGASTRODUODENOSCOPY     normal-Dr Patterson 2012  . LOOP RECORDER INSERTION N/A 05/30/2017   Procedure: Loop Recorder Insertion;  Surgeon: Constance Haw, MD;  Location: [place] CV LAB;  Service: Cardiovascular;  Laterality: N/A;  . LOOP RECORDER REMOVAL N/A 03/04/2018   Procedure: LOOP RECORDER REMOVAL;  Surgeon: Constance Haw, MD;  Location: [place] CV LAB;  Service: Cardiovascular;  Laterality: N/A;  . MYRINGOTOMY WITH TUBE PLACEMENT    . MYRINGOTOMY WITH TUBE PLACEMENT Right 11/05/2017   Procedure: MYRINGOTOMY WITH TUBE PLACEMENT;  Surgeon: Melissa Montane, MD;  Location: [place];  Service: ENT;  Laterality: Right;  right T Tube placement  . NASAL SINUS SURGERY    . TEE WITHOUT CARDIOVERSION N/A 05/30/2017   Procedure: TRANSESOPHAGEAL ECHOCARDIOGRAM (TEE);  Surgeon: Acie Fredrickson Wonda Cheng, MD;  Location: [place];  Service: Cardiovascular;  Laterality: N/A;  . TEE WITHOUT CARDIOVERSION N/A 11/15/2019   Procedure: TRANSESOPHAGEAL ECHOCARDIOGRAM (TEE);  Surgeon: Dorothy Spark, MD;  Location: [facility] ENDOSCOPY;  Service: Cardiovascular;  Laterality: N/A;    Family History:  Family History  Problem Relation Age of Onset  . Diabetes Mother        passed away from accidental death  . Hypertension Mother   . Bipolar disorder Father   . Diabetes Daughter   . Leukemia Daughter   . Ovarian cancer  Maternal Aunt     Social History:  reports that she has never smoked. She has never used smokeless tobacco. She reports that she does not drink alcohol or use drugs.  Additional Social History:  Alcohol / Drug Use Pain Medications: SEE MAR Prescriptions: SEE MAR Over the Counter: SEE MAR History of alcohol / drug use?: No history of alcohol / drug abuse Longest period of sobriety (when/how long): N/A  CIWA:   COWS:    Allergies:  Allergies   Allergen Reactions  . Amoxicillin Itching    Did it involve swelling of the face/tongue/throat, SOB, or low BP? No Did it involve sudden or severe rash/hives, skin peeling, or any reaction on the inside of your mouth or nose? No Did you need to seek medical attention at a hospital or doctor's office? No When did it last happen?within the past 10 years If all above answers are "NO", may proceed with cephalosporin use.   Marland Kitchen Lisinopril Swelling    Angioedema   . Hydrocodone Other (See Comments)    Depressed   . Tegretol [Carbamazepine] Swelling    Throat swells    Home Medications: (Not in a hospital admission)   OB/GYN Status:  No LMP recorded.  General Assessment Data TTS Assessment: In system Is this a Tele or Face-to-Face Assessment?: Face-to-Face Is this an Initial Assessment or a Re-assessment for this encounter?: Initial Assessment Patient Accompanied by:: N/A(Self Referral) Language Other than English: No Living Arrangements: Other (Comment) What gender do you identify as?: Female Marital status: Single Maiden name: (n/a) Pregnancy Status: No Living Arrangements: Children Can pt return to current living arrangement?: Yes Admission Status: Voluntary Is patient capable of signing voluntary admission?: Yes Referral Source: Self/Family/Friend Insurance type: (Medicaid and [employer])  Medical Screening Exam ([place]) Medical Exam completed: Yes  Crisis Care Plan Living Arrangements: Children Legal Guardian: Other:(no legal guardian ) Name of Psychiatrist: (Dr. Zachery Dauer) Name of Therapist: (none reported)  Education Status Is patient currently in school?: No Is the patient employed, unemployed or receiving disability?: Unemployed  Risk to self with the past 6 months Suicidal Ideation: No Has patient been a risk to self within the past 6 months prior to admission? : No Suicidal Intent: No Has patient had any suicidal intent within the past 6 months  prior to admission? : No Is patient at risk for suicide?: No Suicidal Plan?: No Has patient had any suicidal plan within the past 6 months prior to admission? : No Access to Means: No What has been your use of drugs/alcohol within the last 12 months?: (no alcohol and drug use) Previous Attempts/Gestures: No How many times?: (0) Other Self Harm Risks: (n/a) Triggers for Past Attempts: Other (Comment)(no past attempts) Intentional Self Injurious Behavior: None Family Suicide History: No Recent stressful life event(s): Other (Comment)("I'm stressed out COVID, having to stay in all the time") Persecutory voices/beliefs?: No Depression: Yes Depression Symptoms: Feeling angry/irritable, Loss of interest in usual pleasures, Fatigue, Isolating Substance abuse history and/or treatment for substance abuse?: No Suicide prevention information given to non-admitted patients: Not applicable  Risk to Others within the past 6 months Homicidal Ideation: No Does patient have any lifetime risk of violence toward others beyond the six months prior to admission? : No Thoughts of Harm to Others: No Current Homicidal Intent: No Current Homicidal Plan: No Access to Homicidal Means: No Identified Victim: (n/a) History of harm to others?: No Assessment of Violence: None Noted Violent Behavior Description: (patient is calm and cooperative )  Does patient have access to weapons?: No Criminal Charges Pending?: No Does patient have a court date: No Is patient on probation?: No  Psychosis Hallucinations: None noted Delusions: None noted  Mental Status Report Appearance/Hygiene: Disheveled Eye Contact: Good Motor Activity: Freedom of movement Speech: Logical/coherent Level of Consciousness: Alert Mood: Depressed Affect: Appropriate to circumstance Anxiety Level: None Thought Processes: Relevant, Coherent Judgement: Impaired Orientation: Person, Place, Time, Situation Obsessive Compulsive  Thoughts/Behaviors: None  Cognitive Functioning Concentration: Decreased Memory: Recent Intact, Remote Intact Insight: Good Impulse Control: Good Appetite: Good Have you had any weight changes? : Gain Amount of the weight change? (lbs): (unkl) Sleep: No Change Total Hours of Sleep: (8 hrs or more) Vegetative Symptoms: None  ADLScreening [facility] Assessment Services) Patient's cognitive ability adequate to safely complete daily activities?: Yes Patient able to express need for assistance with ADLs?: Yes Independently performs ADLs?: Yes (appropriate for developmental age)  Prior Inpatient Therapy Prior Inpatient Therapy: Yes Prior Therapy Dates: [facility]- yrs ago) Prior Therapy Facilty/Provider(s): [facility]) Reason for Treatment: (medication management, depression, Bipolar Disorder)  Prior Outpatient Therapy Prior Outpatient Therapy: Yes Prior Therapy Dates: (current) Prior Therapy Facilty/Provider(s): (Dr. Marchelle Gearing) Reason for Treatment: (medicatino management) Does patient have an ACCT team?: No Does patient have Intensive In-House Services?  : No Does patient have [facility] services? : No Does patient have P4CC services?: No  ADL Screening (condition at time of admission) Patient's cognitive ability adequate to safely complete daily activities?: Yes Is the patient deaf or have difficulty hearing?: No Does the patient have difficulty seeing, even when wearing glasses/contacts?: No Does the patient have difficulty concentrating, remembering, or making decisions?: No Patient able to express need for assistance with ADLs?: Yes Does the patient have difficulty dressing or bathing?: No Independently performs ADLs?: Yes (appropriate for developmental age) Does the patient have difficulty walking or climbing stairs?: No Weakness of Legs: None Weakness of Arms/Hands: None  Home Assistive Devices/Equipment Home Assistive Devices/Equipment: None  Therapy Consults (therapy consults require  a physician order) PT Evaluation Needed: No OT Evalulation Needed: No SLP Evaluation Needed: No Abuse/Neglect Assessment (Assessment to be complete while patient is alone) Abuse/Neglect Assessment Can Be Completed: Yes Physical Abuse: Denies Verbal Abuse: Denies Sexual Abuse: Denies Exploitation of patient/patient's resources: Denies Self-Neglect: Denies Values / Beliefs Cultural Requests During Hospitalization: None Spiritual Requests During Hospitalization: None Consults Spiritual Care Consult Needed: No Transition of Care Team Consult Needed: No Advance Directives (For Healthcare) Does Patient Have a Medical Advance Directive?: No Nutrition Screen- [facility] Adult/WL/AP Patient's home diet: Regular Has the patient recently lost weight without trying?: No Has the patient been eating poorly because of a decreased appetite?: No Malnutrition Screening Tool Score: 0        Disposition: Caryl Ada, NP recommends psych clearance. Patient to follow up with current psychiatrist (Dr. Zachery Dauer). Patient has a appointment with the provider 02/01/2020.  Disposition Initial Assessment Completed for this Encounter: Yes Disposition of Patient: Discharge(follow up with current provider; Dr. Marchelle Gearing ) Patient referred to: Outpatient clinic referral(current provider Dr. Zachery Dauer)  On Site Evaluation by:   Reviewed with Physician:    Waldon Merl 01/25/2020 5:30 PM

## 2020-01-26 ENCOUNTER — Ambulatory Visit: Payer: Self-pay

## 2020-02-03 ENCOUNTER — Ambulatory Visit (INDEPENDENT_AMBULATORY_CARE_PROVIDER_SITE_OTHER): Payer: Medicare HMO | Admitting: Psychiatry

## 2020-02-03 ENCOUNTER — Other Ambulatory Visit: Payer: Self-pay

## 2020-02-03 DIAGNOSIS — F0630 Mood disorder due to known physiological condition, unspecified: Secondary | ICD-10-CM

## 2020-02-03 MED ORDER — ARIPIPRAZOLE 2 MG PO TABS: 2.0000 mg | ORAL_TABLET | Freq: Every day | ORAL | 5 refills | Status: DC

## 2020-02-03 NOTE — Progress Notes (Signed)
Psychiatric Initial Adult Assessment   Patient Identification: Brandi Gomez MRN:  [id number] Date of Evaluation:  02/03/2020 Referral Source: Dr. Tory Emerald Chief Complaint:  Suspiciousness/paranoia Visit Diagnosis: Mood disorder secondary to CVA  The patient seems to be doing well.  She was still feeling apparently became very emotional she was seen at our facility and she was placed back on her Abilify 2 mg.  The patient's blood pressure is well controlled.  This is a low dose and I think it is appropriate.  She clearly got worse off the Abilify.  Today she is doing much better.  She was interviewed with her caregiver [name] who says she is much improved back on the Abilify.  The patient is very cautious of the pandemic.  She denies daily depression or problems with sleep or appetite.  She does not drink alcohol or use any drugs.  I think back on the Abilify is helpful.  He makes no longer attempts to elope from [name]. Depression Symptoms:  fatigue, (Hypo) Manic Symptoms:   Anxiety Symptoms:   Psychotic Symptoms:   PTSD Symptoms:   Past Psychiatric History: Cymbalta 30 mg Previous Psychotropic Medications: Cymbalta 30 mg  Substance Abuse History in the last 12 months:  No.  Consequences of Substance Abuse:   Past Medical History:  Past Medical History:  Diagnosis Date  . Anemia   . Anxiety   . Bipolar 1 disorder ([place])   . Common migraine 05/19/2015  . Depression   . Diabetes mellitus, type II ([place])   . Hypertension   . Irritable bowel syndrome (IBS)   . Mild mental retardation   . Obesity   . Partial complex seizure disorder with intractable epilepsy ([place]) 05/12/2014  . Seizures ([place])    intractable, sz 08/23/17  . Sleep apnea   . Stroke ([place])   . Type II or unspecified type diabetes mellitus without mention of complication, not stated as uncontrolled     Past Surgical History:  Procedure Laterality Date  . BUBBLE STUDY  11/15/2019   Procedure: BUBBLE STUDY;  Surgeon:  Dorothy Spark, MD;  Location: [place];  Service: Cardiovascular;;  . COLONOSCOPY     2012-normal , Dr Sharlett Iles  . ESOPHAGOGASTRODUODENOSCOPY     normal-Dr Patterson 2012  . LOOP RECORDER INSERTION N/A 05/30/2017   Procedure: Loop Recorder Insertion;  Surgeon: Constance Haw, MD;  Location: [place] CV LAB;  Service: Cardiovascular;  Laterality: N/A;  . LOOP RECORDER REMOVAL N/A 03/04/2018   Procedure: LOOP RECORDER REMOVAL;  Surgeon: Constance Haw, MD;  Location: [place] CV LAB;  Service: Cardiovascular;  Laterality: N/A;  . MYRINGOTOMY WITH TUBE PLACEMENT    . MYRINGOTOMY WITH TUBE PLACEMENT Right 11/05/2017   Procedure: MYRINGOTOMY WITH TUBE PLACEMENT;  Surgeon: Melissa Montane, MD;  Location: [place];  Service: ENT;  Laterality: Right;  right T Tube placement  . NASAL SINUS SURGERY    . TEE WITHOUT CARDIOVERSION N/A 05/30/2017   Procedure: TRANSESOPHAGEAL ECHOCARDIOGRAM (TEE);  Surgeon: Acie Fredrickson Wonda Cheng, MD;  Location: [place];  Service: Cardiovascular;  Laterality: N/A;  . TEE WITHOUT CARDIOVERSION N/A 11/15/2019   Procedure: TRANSESOPHAGEAL ECHOCARDIOGRAM (TEE);  Surgeon: Dorothy Spark, MD;  Location: [facility] ENDOSCOPY;  Service: Cardiovascular;  Laterality: N/A;    Family Psychiatric History:   Family History:  Family History  Problem Relation Age of Onset  . Diabetes Mother        passed away from accidental death  . Hypertension Mother   .  Bipolar disorder Father   . Diabetes Daughter   . Leukemia Daughter   . Ovarian cancer Maternal Aunt     Social History:   Social History   Socioeconomic History  . Marital status: Single    Spouse name: [name]  . Number of children: 3  . Years of education: 79  . Highest education level: Not on file  Occupational History  . Occupation: disabled    Employer: DISABLED  Tobacco Use  . Smoking status: Never Smoker  . Smokeless tobacco: Never Used  Substance and Sexual Activity  . Alcohol use: No  .  Drug use: No  . Sexual activity: Never  Other Topics Concern  . Not on file  Social History Narrative   Patient lives at home with daughter.    Patient has 3 children.    Patient is right handed.    Patient has a high school education.    Patient is on disability   Patient drinks 2 cups of caffeine daily.   Social Determinants of Health   Financial Resource Strain:   . Difficulty of Paying Living Expenses: Not on file  Food Insecurity: No Food Insecurity  . Worried About Charity fundraiser in the Last Year: Never true  . Ran Out of Food in the Last Year: Never true  Transportation Needs: No Transportation Needs  . Lack of Transportation (Medical): No  . Lack of Transportation (Non-Medical): No  Physical Activity:   . Days of Exercise per Week: Not on file  . Minutes of Exercise per Session: Not on file  Stress:   . Feeling of Stress : Not on file  Social Connections:   . Frequency of Communication with Friends and Family: Not on file  . Frequency of Social Gatherings with Friends and Family: Not on file  . Attends Religious Services: Not on file  . Active Member of Clubs or Organizations: Not on file  . Attends Archivist Meetings: Not on file  . Marital Status: Not on file    Additional Social History:   Allergies:   Allergies  Allergen Reactions  . Amoxicillin Itching    Did it involve swelling of the face/tongue/throat, SOB, or low BP? No Did it involve sudden or severe rash/hives, skin peeling, or any reaction on the inside of your mouth or nose? No Did you need to seek medical attention at a hospital or doctor's office? No When did it last happen?within the past 10 years If all above answers are "NO", may proceed with cephalosporin use.   Marland Kitchen Lisinopril Swelling    Angioedema   . Hydrocodone Other (See Comments)    Depressed   . Tegretol [Carbamazepine] Swelling    Throat swells    Metabolic Disorder Labs: Lab Results  Component Value Date    HGBA1C 6.2 (H) 01/04/2020   MPG 139.85 10/12/2019   MPG 163 11/05/2017   No results found for: PROLACTIN Lab Results  Component Value Date   CHOL 123 10/12/2019   TRIG 49 10/12/2019   HDL 49 10/12/2019   CHOLHDL 2.5 10/12/2019   VLDL 10 10/12/2019   LDLCALC 64 10/12/2019   LDLCALC 82 05/21/2017     Current Medications: Current Outpatient Medications  Medication Sig Dispense Refill  . acetaminophen (TYLENOL) 325 MG tablet Take 2 tablets (650 mg total) by mouth every 4 (four) hours as needed for mild pain (or temp > 37.5 C (99.5 F)).    Marland Kitchen ARIPiprazole (ABILIFY) 2 MG tablet Take  1 tablet (2 mg total) by mouth daily. 30 tablet 5  . atorvastatin (LIPITOR) 40 MG tablet Take 1 tablet (40 mg total) by mouth daily at 6 PM. 90 tablet 3  . carvedilol (COREG) 3.125 MG tablet Take 1 tablet (3.125 mg total) by mouth 2 (two) times daily. 180 tablet 3  . hydrALAZINE (APRESOLINE) 10 MG tablet Take 1 tablet (10 mg total) by mouth 2 (two) times daily. 180 tablet 3  . isosorbide dinitrate (ISORDIL) 10 MG tablet Take 1 tablet (10 mg total) by mouth 2 (two) times daily. 180 tablet 3  . lamoTRIgine (LAMICTAL) 100 MG tablet Take 1 tablet in the morning, 1.5 tablets in the evening x 2 weeks, then take 1.5 tablets twice daily 270 tablet 3   No current facility-administered medications for this visit.    Neurologic: Headache: No Seizure: Yes Paresthesias:No  Musculoskeletal: Strength & Muscle Tone: within normal limits Gait & Station: normal Patient leans: Backward and N/A  Psychiatric Specialty Exam: ROS  There were no vitals taken for this visit.There is no height or weight on file to calculate BMI.  General Appearance: Casual  Eye Contact:  Fair  Speech:  Slurred  Volume:  Normal  Mood:  Dysphoric  Affect:  Appropriate  Thought Process:  Goal Directed  Orientation:  Full (Time, Place, and Person)  Thought Content:  Logical  Suicidal Thoughts:  No  Homicidal Thoughts:  No  Memory:   Negative  Judgement:  Fair  Insight:  Lacking  Psychomotor Activity:  Decreased  Concentration:    Recall:  [place] of Knowledge:Fair  Language: Fair  Akathisia:  No  Handed:    AIMS (if indicated):    Assets:  Desire for Improvement  ADL's:  Intact  Cognition: WNL  Sleep:      Treatment Plan Summary: 2/25/20212:05 PM   Today the patient is doing better on her Abilify.  #1 problem seems to be some form of developmental disability or dementia.Better back on her goal 5. Her #1 problem seems to be some form of developmental disability or dementia. He also is possibly related to her CVA.  No had any support I 2 mg.ad and support her Abilify 2 mg. She also takes Lamictal for seizures. She's had no seizures.  Her mood is stable.  She is friendly and engaging.  She will be seen again in 3 months.  She is functioning fairly well and has a good support .

## 2020-02-04 ENCOUNTER — Ambulatory Visit: Payer: Medicare HMO | Admitting: Emergency Medicine

## 2020-02-23 ENCOUNTER — Other Ambulatory Visit: Payer: Self-pay

## 2020-02-23 ENCOUNTER — Ambulatory Visit: Payer: Medicare HMO | Admitting: Cardiovascular Disease

## 2020-02-23 NOTE — Patient Outreach (Signed)
[place] [facility]) Care Management  02/23/2020  Brandi Gomez Dec 19, 1970 [id number]   Telephone call to patient for follow up.  No answer.  HIPAA compliant voice message left.  Plan: RN CM will attempt again in the month of April.    [name], RN, MSN [place] Management Care Management Coordinator Direct Line [phone] Cell [phone] Toll Free: [phone]  Fax: [phone]

## 2020-02-26 ENCOUNTER — Other Ambulatory Visit (HOSPITAL_COMMUNITY): Payer: Self-pay | Admitting: Psychiatry

## 2020-03-15 ENCOUNTER — Other Ambulatory Visit: Payer: Self-pay

## 2020-03-15 NOTE — Patient Outreach (Signed)
[place] [facility]) Care Management  03/15/2020  Brandi Gomez 07-Aug-1971 [id number]   Telephone call to patient for follow up. No answer.  HIPAA compliant voice message left.    Plan: RN CM will attempt patient again in the month of May and send letter.    [name], RN, MSN [place] Management Care Management Coordinator Direct Line [phone] Cell [phone] Toll Free: [phone]  Fax: [phone]

## 2020-03-16 ENCOUNTER — Ambulatory Visit: Payer: Self-pay

## 2020-03-30 ENCOUNTER — Other Ambulatory Visit (HOSPITAL_COMMUNITY): Payer: Self-pay | Admitting: *Deleted

## 2020-03-30 MED ORDER — ARIPIPRAZOLE 5 MG PO TABS: 5.0000 mg | ORAL_TABLET | Freq: Every day | ORAL | 0 refills | Status: DC

## 2020-03-31 ENCOUNTER — Other Ambulatory Visit (HOSPITAL_COMMUNITY): Payer: Self-pay | Admitting: *Deleted

## 2020-03-31 ENCOUNTER — Telehealth (HOSPITAL_COMMUNITY): Payer: Self-pay | Admitting: *Deleted

## 2020-03-31 MED ORDER — ARIPIPRAZOLE 10 MG PO TABS: 10.0000 mg | ORAL_TABLET | Freq: Every day | ORAL | 2 refills | Status: DC

## 2020-03-31 NOTE — Telephone Encounter (Addendum)
Writer received several phones call back to back from pt c/o needing "stronger medicine" and stating she didn't want to go to the hospital as she's been before. Pt spoke with Dr. Casimiro Needle who authorized increase Abilify to 10mg  qd. Writer informed pt and also left VM for her caretaker Brandi Gomez. Pt denies s.i. or thoughts of self harm. Pt verbalized understanding and was grateful.

## 2020-03-31 NOTE — Telephone Encounter (Deleted)
Received request for refill of ADHD Medication. Horald Pollen, MD Current Outpatient Medications  Medication Sig Dispense Refill  . acetaminophen (TYLENOL) 325 MG tablet Take 2 tablets (650 mg total) by mouth every 4 (four) hours as needed for mild pain (or temp > 37.5 C (99.5 F)).    Marland Kitchen ARIPiprazole (ABILIFY) 10 MG tablet Take 1 tablet (10 mg total) by mouth daily. 30 tablet 2  . atorvastatin (LIPITOR) 40 MG tablet Take 1 tablet (40 mg total) by mouth daily at 6 PM. 90 tablet 3  . carvedilol (COREG) 3.125 MG tablet Take 1 tablet (3.125 mg total) by mouth 2 (two) times daily. 180 tablet 3  . hydrALAZINE (APRESOLINE) 10 MG tablet Take 1 tablet (10 mg total) by mouth 2 (two) times daily. 180 tablet 3  . isosorbide dinitrate (ISORDIL) 10 MG tablet Take 1 tablet (10 mg total) by mouth 2 (two) times daily. 180 tablet 3  . lamoTRIgine (LAMICTAL) 100 MG tablet Take 1 tablet in the morning, 1.5 tablets in the evening x 2 weeks, then take 1.5 tablets twice daily 270 tablet 3   No current facility-administered medications for this visit.   Last refill date of {med adhd:312039} Medication: # dispensed: *** # days of med left: *** To be picked up at [facility] pharmacy.  Does Janira seem to have any problems with moodiness, appetite, weight loss, or sleep? {yes [id number] Any complaints by [place] about taking the medications? {yes [id number] When was she last examined for ADHD? *** Who is she seeing for his ADHD symptoms? *** When is her next appointment due? *** Rx request routed to Dr. Marland Kitchen for signature.

## 2020-04-03 ENCOUNTER — Ambulatory Visit: Payer: Medicare HMO | Admitting: Emergency Medicine

## 2020-04-10 ENCOUNTER — Other Ambulatory Visit: Payer: Self-pay

## 2020-04-10 ENCOUNTER — Encounter (HOSPITAL_COMMUNITY): Payer: Self-pay

## 2020-04-10 ENCOUNTER — Ambulatory Visit (HOSPITAL_COMMUNITY)
Admission: EM | Admit: 2020-04-10 | Discharge: 2020-04-10 | Disposition: A | Discharge status: Home / Self Care | Payer: Medicare HMO | Attending: Family Medicine | Admitting: Family Medicine

## 2020-04-10 DIAGNOSIS — Z3202 Encounter for pregnancy test, result negative: Secondary | ICD-10-CM

## 2020-04-10 DIAGNOSIS — R300 Dysuria: Secondary | ICD-10-CM | POA: Insufficient documentation

## 2020-04-10 LAB — POCT URINALYSIS DIP (DEVICE)
Bilirubin Urine: NEGATIVE
Glucose, UA: NEGATIVE mg/dL
Hgb urine dipstick: NEGATIVE
Ketones, ur: NEGATIVE mg/dL
Nitrite: NEGATIVE
Protein, ur: 30 mg/dL — AB
Specific Gravity, Urine: >=1.03 (ref 1.005–1.030)
Urobilinogen, UA: 0.2 mg/dL (ref 0.0–1.0)
pH: 6 (ref 5.0–8.0)

## 2020-04-10 LAB — POC URINE PREG, ED: Preg Test, Ur: NEGATIVE

## 2020-04-10 NOTE — Discharge Instructions (Signed)
We will call with the urine culture results  Please stay well hydrated  Please follow up if your symptoms worsen or fail to improve.

## 2020-04-10 NOTE — ED Triage Notes (Signed)
Pt states she wants pregnancy test and urine test for UTI. PT states her LMP 02/21/2020. [name] foul urine odor. Pt denies any other symptoms. [name] N/V x2 days. Body aches a couple wks ago, but has gotten better.

## 2020-04-10 NOTE — ED Provider Notes (Signed)
[facility]    CSN: [id number] Arrival date & time: 04/10/20  1727      History   Chief Complaint Chief Complaint  Patient presents with  . Urinary Tract Infection    HPI Brandi Gomez is a 49 y.o. female.   She is presenting with spotting as well as nausea and dysuria.  Symptoms have been present for a few days.  Denies any exposure to anyone with similar symptoms.  Symptom be staying the same.  HPI  Past Medical History:  Diagnosis Date  . Anemia   . Anxiety   . Bipolar 1 disorder ([place])   . Common migraine 05/19/2015  . Depression   . Diabetes mellitus, type II ([place])   . Hypertension   . Irritable bowel syndrome (IBS)   . Mild mental retardation   . Obesity   . Partial complex seizure disorder with intractable epilepsy ([place]) 05/12/2014  . Seizures ([place])    intractable, sz 08/23/17  . Sleep apnea   . Stroke ([place])   . Type II or unspecified type diabetes mellitus without mention of complication, not stated as uncontrolled     Patient Active Problem List   Diagnosis Date Noted  . Ischemic cerebrovascular accident (CVA) of frontal lobe ([place]) 10/15/2019  . Acute CVA (cerebrovascular accident) ([place]) 10/12/2019  . HLD (hyperlipidemia) 10/12/2019  . CKD (chronic kidney disease) stage 3, GFR 30-59 ml/min 10/12/2019  . Acute ischemic stroke ([place]) 10/12/2019  . Chronic combined systolic and diastolic heart failure ([place])   . Depression 07/14/2019  . Bipolar disorder ([place]) 05/13/2019  . Bipolar 1 disorder ([place]) 05/13/2019  . Adjustment disorder with anxiety   . History of recent stroke 08/06/2017  . OSA (obstructive sleep apnea)   . Cryptogenic stroke ([place]) 05/20/2017  . Controlled type 2 diabetes mellitus without complication, without long-term current use of insulin ([place]) 05/08/2017  . Essential hypertension 07/27/2008  . Seizure disorder ([place]) 02/05/2007    Past Surgical History:  Procedure Laterality Date  . BUBBLE STUDY  11/15/2019   Procedure:  BUBBLE STUDY;  Surgeon: Dorothy Spark, MD;  Location: [facility];  Service: Cardiovascular;;  . COLONOSCOPY     2012-normal , Dr Sharlett Iles  . ESOPHAGOGASTRODUODENOSCOPY     normal-Dr Patterson 2012  . LOOP RECORDER INSERTION N/A 05/30/2017   Procedure: Loop Recorder Insertion;  Surgeon: Constance Haw, MD;  Location: [facility];  Service: Cardiovascular;  Laterality: N/A;  . LOOP RECORDER REMOVAL N/A 03/04/2018   Procedure: LOOP RECORDER REMOVAL;  Surgeon: Constance Haw, MD;  Location: [facility];  Service: Cardiovascular;  Laterality: N/A;  . MYRINGOTOMY WITH TUBE PLACEMENT    . MYRINGOTOMY WITH TUBE PLACEMENT Right 11/05/2017   Procedure: MYRINGOTOMY WITH TUBE PLACEMENT;  Surgeon: Melissa Montane, MD;  Location: [facility];  Service: ENT;  Laterality: Right;  right T Tube placement  . NASAL SINUS SURGERY    . TEE WITHOUT CARDIOVERSION N/A 05/30/2017   Procedure: TRANSESOPHAGEAL ECHOCARDIOGRAM (TEE);  Surgeon: Acie Fredrickson Wonda Cheng, MD;  Location: [facility];  Service: Cardiovascular;  Laterality: N/A;  . TEE WITHOUT CARDIOVERSION N/A 11/15/2019   Procedure: TRANSESOPHAGEAL ECHOCARDIOGRAM (TEE);  Surgeon: Dorothy Spark, MD;  [facility];  Service: Cardiovascular;  Laterality: N/A;    OB History   No obstetric history on file.      Home Medications    Prior to Admission medications   Medication Sig Start Date End Date Taking? Authorizing Provider  acetaminophen (TYLENOL)  325 MG tablet Take 2 tablets (650 mg total) by mouth every 4 (four) hours as needed for mild pain (or temp > 37.5 C (99.5 F)). 10/19/19   Angiulli, Lavon Paganini, PA-C  ARIPiprazole (ABILIFY) 10 MG tablet Take 1 tablet (10 mg total) by mouth daily. 03/31/20 03/31/21  Plovsky, Berneta Sages, MD  atorvastatin (LIPITOR) 40 MG tablet Take 1 tablet (40 mg total) by mouth daily at 6 PM. 01/04/20 04/03/20  Sagardia, Ines Bloomer, MD  carvedilol (COREG) 3.125 MG tablet Take 1 tablet (3.125 mg total) by  mouth 2 (two) times daily. 01/04/20 04/03/20  Horald Pollen, MD  hydrALAZINE (APRESOLINE) 10 MG tablet Take 1 tablet (10 mg total) by mouth 2 (two) times daily. 01/04/20 04/03/20  Horald Pollen, MD  isosorbide dinitrate (ISORDIL) 10 MG tablet Take 1 tablet (10 mg total) by mouth 2 (two) times daily. 01/04/20 04/03/20  Horald Pollen, MD  lamoTRIgine (LAMICTAL) 100 MG tablet Take 1 tablet in the morning, 1.5 tablets in the evening x 2 weeks, then take 1.5 tablets twice daily 01/06/20   [name], NP  methylphenidate (RITALIN) 5 MG tablet Take 1 tablet (5 mg total) by mouth 2 (two) times daily. Patient not taking: Reported on 12/29/2019 10/29/19 01/01/20  Izora Ribas, MD  torsemide (DEMADEX) 10 MG tablet Take 1 tablet (10 mg total) by mouth daily. Patient not taking: Reported on 12/29/2019 10/19/19 01/01/20  Angiulli, Lavon Paganini, PA-C  traZODone (DESYREL) 100 MG tablet Take 1 tablet (100 mg total) by mouth at bedtime. Patient not taking: Reported on 12/29/2019 10/19/19 01/01/20  Angiulli, Lavon Paganini, PA-C    Family History Family History  Problem Relation Age of Onset  . Diabetes Mother        passed away from accidental death  . Hypertension Mother   . Bipolar disorder Father   . Diabetes Daughter   . Leukemia Daughter   . Ovarian cancer Maternal Aunt     Social History Social History   Tobacco Use  . Smoking status: Never Smoker  . Smokeless tobacco: Never Used  Substance Use Topics  . Alcohol use: No  . Drug use: No     Allergies   Amoxicillin, Lisinopril, Hydrocodone, and Tegretol [carbamazepine]   Review of Systems Review of Systems  See HPI  Physical Exam Triage Vital Signs ED Triage Vitals  Enc Vitals Group     BP 04/10/20 1817 130/71     Pulse Rate 04/10/20 1817 86     Resp 04/10/20 1817 18     Temp 04/10/20 1817 99 F (37.2 C)     Temp Source 04/10/20 1817 Oral     SpO2 04/10/20 1817 99 %     Weight 04/10/20 1821 278 lb (126.1 kg)      Height 04/10/20 1821 5\' 1"  (1.549 m)     Head Circumference --      Peak Flow --      Pain Score 04/10/20 1821 0     Pain Loc --      Pain Edu? --      Excl. in [place]? --    No data found.  Updated Vital Signs BP 130/71   Pulse 86   Temp 99 F (37.2 C) (Oral)   Resp 18   Ht 5\' 1"  (1.549 m)   Wt 126.1 kg   SpO2 99%   BMI 52.53 kg/m   Visual Acuity Right Eye Distance:   Left Eye Distance:   Bilateral Distance:  Right Eye Near:   Left Eye Near:    Bilateral Near:     Physical Exam Gen: NAD, alert, cooperative with exam, well-appearing ENT: normal lips, normal nasal mucosa,  Eye: normal EOM, normal conjunctiva and lids CV:  no edema, +2 pedal pulses   Resp: no accessory muscle use, non-labored,  Skin: no rashes, no areas of induration      UC Treatments / Results  Labs (all labs ordered are listed, but only abnormal results are displayed) Labs Reviewed  POCT URINALYSIS DIP (DEVICE) - Abnormal; Notable for the following components:      Result Value   Protein, ur 30 (*)    Leukocytes,Ua TRACE (*)    All other components within normal limits  URINE CULTURE  POC URINE PREG, ED    EKG   Radiology No results found.  Procedures Procedures (including critical care time)  Medications Ordered in UC Medications - No data to display  Initial Impression / Assessment and Plan / UC Course  I have reviewed the triage vital signs and the nursing notes.  Pertinent labs & imaging results that were available during my care of the patient were reviewed by me and considered in my medical decision making (see chart for details).     Ms. Sweeton is a 49 year old female that is presenting with dysuria.  Urine pregnancy was negative.  Urinalysis showed trace leukocytes.  Will obtain urine culture.  Counseled on supportive care.  Given indications to follow-up return.  Final Clinical Impressions(s) / UC Diagnoses   Final diagnoses:  Dysuria     Discharge  Instructions     We will call with the urine culture results  Please stay well hydrated  Please follow up if your symptoms worsen or fail to improve.     ED Prescriptions    None     PDMP not reviewed this encounter.   Rosemarie Ax, MD 04/10/20 [phone]

## 2020-04-11 LAB — URINE CULTURE

## 2020-04-14 ENCOUNTER — Other Ambulatory Visit: Payer: Self-pay

## 2020-04-14 NOTE — Patient Outreach (Signed)
[facility]) Care Management  04/14/2020  Brandi Gomez [birth year] [id number]   Telephone call to patient for follow up. No answer.  HIPAA compliant voice message left.   Plan: RN CM will attempt patient again in the month of June.    [name], RN, MSN [place] Management Care Management Coordinator Direct Line [phone] Cell [phone] Toll Free: [phone]  Fax: [phone]

## 2020-04-14 NOTE — Patient Outreach (Signed)
[facility]) Care Management  [place]  04/14/2020   Brandi Gomez [birth year] [id number]  Subjective: Incoming call to patient for follow up. Patient reports that she is doing ok but has some problems in the morning with her legs cramping and stiff. Discussed exercises in bed and on the side of bed to help.  She verbalized understanding. Patient reports she has not been checking her blood pressure.  Advised patient to check blood pressure at least weekly. Discussed blood pressure and signs of stroke as patient has previously had a stroke. She verbalized understanding. Discussed CM remaining in contact with patient and that CM plans to call next month. Patient agreeable to this plan.    Objective:   Encounter Medications:  Outpatient Encounter Medications as of 04/14/2020  Medication Sig Note  . acetaminophen (TYLENOL) 325 MG tablet Take 2 tablets (650 mg total) by mouth every 4 (four) hours as needed for mild pain (or temp > 37.5 C (99.5 F)).   Marland Kitchen ARIPiprazole (ABILIFY) 10 MG tablet Take 1 tablet (10 mg total) by mouth daily.   Marland Kitchen lamoTRIgine (LAMICTAL) 100 MG tablet Take 1 tablet in the morning, 1.5 tablets in the evening x 2 weeks, then take 1.5 tablets twice daily   . atorvastatin (LIPITOR) 40 MG tablet Take 1 tablet (40 mg total) by mouth daily at 6 PM.   . carvedilol (COREG) 3.125 MG tablet Take 1 tablet (3.125 mg total) by mouth 2 (two) times daily. 01/05/2020: Pt could not remember if she took this  . hydrALAZINE (APRESOLINE) 10 MG tablet Take 1 tablet (10 mg total) by mouth 2 (two) times daily. 01/05/2020: Pt could not remember if she took this   . isosorbide dinitrate (ISORDIL) 10 MG tablet Take 1 tablet (10 mg total) by mouth 2 (two) times daily. 01/05/2020: Pt could not remember if she took this   . [DISCONTINUED] methylphenidate (RITALIN) 5 MG tablet Take 1 tablet (5 mg total) by mouth 2 (two) times daily. (Patient not taking: Reported on 12/29/2019)  11/08/2019: Hasnt started  . [DISCONTINUED] torsemide (DEMADEX) 10 MG tablet Take 1 tablet (10 mg total) by mouth daily. (Patient not taking: Reported on 12/29/2019)   . [DISCONTINUED] traZODone (DESYREL) 100 MG tablet Take 1 tablet (100 mg total) by mouth at bedtime. (Patient not taking: Reported on 12/29/2019)    No facility-administered encounter medications on file as of 04/14/2020.    Functional Status:  In your present state of health, do you have any difficulty performing the following activities: 04/14/2020 10/15/2019  Hearing? N N  Vision? N N  Difficulty concentrating or making decisions? N Y  Walking or climbing stairs? N Y  Dressing or bathing? N Y  Doing errands, shopping? Tempie Donning  Comment patient needs assistance -  Conservation officer, nature and eating ? N -  Using the Toilet? N -  Comment - -  In the past six months, have you accidently leaked urine? N -  Do you have problems with loss of bowel control? N -  Managing your Medications? Y -  Comment Friend gives medication daily -  Managing your Finances? N -  Housekeeping or managing your Housekeeping? N -  Some encounter information is confidential and restricted. Go to Review Flowsheets activity to see all data.  Some recent data might be hidden    Fall/Depression Screening: Fall Risk  04/14/2020 01/04/2020 09/01/2019  Falls in the past year? 0 0 0  Number falls in past yr: - 0 -  Injury with Fall? - 0 -  Comment - - -  Follow up - Falls evaluation completed -   PHQ 2/9 Scores 04/14/2020 01/04/2020 09/01/2019 08/09/2019 06/21/2019 05/12/2019 05/06/2019  PHQ - 2 Score 1 0 0 0 0 0 0    Assessment: Patient continues to managing chronic health issues and is agreeable to continued CM outreach.   Plan:  [facility] CM Care Plan Problem One     Most Recent Value  Care Plan Problem One  Deficient knowledge related to Hypertension  Role Documenting the Problem One  Care Management Telephonic Coordinator  Care Plan for Problem One  Active  [facility] Long Term  Goal   Pt will verbalized two symptoms of elevated BP within 90 days  [facility] Long Term Goal Start Date  04/14/20  Interventions for Problem One Long Term Goal  RN CM discussed diet, exercise, and monitoring blood pressure.    [facility] CM Short Term Goal #1   Patient will monitor blood pressure at least weekly with 30 days.  [facility] CM Short Term Goal #1 Start Date  04/14/20  Interventions for Short Term Goal #1  RN CM discussed importance of checking blood pressure.  Discussed blood pressure nd signs of stroke.       RN CM will contact patient again in the month of June and patient agreeable.   RN CM will send update to physician.    [name], RN, MSN [place] Management Care Management Coordinator Direct Line [phone] Cell [phone] Toll Free: [phone]  Fax: [phone]

## 2020-04-18 ENCOUNTER — Ambulatory Visit (INDEPENDENT_AMBULATORY_CARE_PROVIDER_SITE_OTHER): Payer: Medicare HMO | Admitting: Emergency Medicine

## 2020-04-18 ENCOUNTER — Other Ambulatory Visit: Payer: Self-pay

## 2020-04-18 ENCOUNTER — Encounter: Payer: Self-pay | Admitting: Emergency Medicine

## 2020-04-18 VITALS — BP 111/65 | HR 71 | Temp 97.7°F | Resp 16 | Ht 61.0 in | Wt 296.0 lb

## 2020-04-18 DIAGNOSIS — N1831 Chronic kidney disease, stage 3a: Secondary | ICD-10-CM | POA: Diagnosis not present

## 2020-04-18 DIAGNOSIS — I5042 Chronic combined systolic (congestive) and diastolic (congestive) heart failure: Secondary | ICD-10-CM

## 2020-04-18 DIAGNOSIS — I10 Essential (primary) hypertension: Secondary | ICD-10-CM | POA: Diagnosis not present

## 2020-04-18 DIAGNOSIS — Z8673 Personal history of transient ischemic attack (TIA), and cerebral infarction without residual deficits: Secondary | ICD-10-CM | POA: Diagnosis not present

## 2020-04-18 DIAGNOSIS — M25561 Pain in right knee: Secondary | ICD-10-CM

## 2020-04-18 DIAGNOSIS — Z8669 Personal history of other diseases of the nervous system and sense organs: Secondary | ICD-10-CM

## 2020-04-18 DIAGNOSIS — Z1231 Encounter for screening mammogram for malignant neoplasm of breast: Secondary | ICD-10-CM | POA: Diagnosis not present

## 2020-04-18 DIAGNOSIS — Z6841 Body Mass Index (BMI) 40.0 and over, adult: Secondary | ICD-10-CM | POA: Diagnosis not present

## 2020-04-18 DIAGNOSIS — Z1211 Encounter for screening for malignant neoplasm of colon: Secondary | ICD-10-CM

## 2020-04-18 DIAGNOSIS — E1159 Type 2 diabetes mellitus with other circulatory complications: Secondary | ICD-10-CM | POA: Diagnosis not present

## 2020-04-18 DIAGNOSIS — G8929 Other chronic pain: Secondary | ICD-10-CM

## 2020-04-18 NOTE — Progress Notes (Signed)
Brandi Gomez 49 y.o.   Chief Complaint  Patient presents with  . Diabetes    follow up     HISTORY OF PRESENT ILLNESS: This is a 49 y.o. female here for follow-up of chronic medical conditions including diabetes. Also has a history of hypertension with hypertensive heart disease and chronic kidney disease. Has been having right knee pain for a while.  Thinks she may have arthritis. Reports weight gain. Wt Readings from Last 3 Encounters:  04/18/20 296 lb (134.3 kg)  04/10/20 278 lb (126.1 kg)  01/06/20 280 lb (127 kg)    Lab Results  Component Value Date   HGBA1C 6.2 (H) 01/04/2020   Lab Results  Component Value Date   CREATININE 1.35 (H) 01/04/2020   BUN 15 01/04/2020   NA 138 01/04/2020   K 4.6 01/04/2020   CL 103 01/04/2020   CO2 20 01/04/2020   BP Readings from Last 3 Encounters:  04/18/20 111/65  04/10/20 130/71  01/06/20 137/77   Lab Results  Component Value Date   CHOL 123 10/12/2019   HDL 49 10/12/2019   LDLCALC 64 10/12/2019   TRIG 49 10/12/2019   CHOLHDL 2.5 10/12/2019   Depression screen PHQ 2/9 04/18/2020 04/14/2020 01/04/2020 09/01/2019 08/09/2019  Decreased Interest 1 0 0 0 0  Down, Depressed, Hopeless 0 1 0 0 0  PHQ - 2 Score 1 1 0 0 0  Some recent data might be hidden     HPI   Prior to Admission medications   Medication Sig Start Date End Date Taking? Authorizing Provider  acetaminophen (TYLENOL) 325 MG tablet Take 2 tablets (650 mg total) by mouth every 4 (four) hours as needed for mild pain (or temp > 37.5 C (99.5 F)). 10/19/19  Yes Angiulli, Lavon Paganini, PA-C  ARIPiprazole (ABILIFY) 10 MG tablet Take 1 tablet (10 mg total) by mouth daily. 03/31/20 03/31/21 Yes Plovsky, Berneta Sages, MD  lamoTRIgine (LAMICTAL) 100 MG tablet Take 1 tablet in the morning, 1.5 tablets in the evening x 2 weeks, then take 1.5 tablets twice daily 01/06/20  Yes [name], NP  torsemide (DEMADEX) 10 MG tablet Take 10 mg by mouth daily.   Yes [provider]   atorvastatin (LIPITOR) 40 MG tablet Take 1 tablet (40 mg total) by mouth daily at 6 PM. 01/04/20 04/03/20  Camran Keady, Ines Bloomer, MD  carvedilol (COREG) 3.125 MG tablet Take 1 tablet (3.125 mg total) by mouth 2 (two) times daily. 01/04/20 04/03/20  Horald Pollen, MD  hydrALAZINE (APRESOLINE) 10 MG tablet Take 1 tablet (10 mg total) by mouth 2 (two) times daily. 01/04/20 04/03/20  Horald Pollen, MD  isosorbide dinitrate (ISORDIL) 10 MG tablet Take 1 tablet (10 mg total) by mouth 2 (two) times daily. 01/04/20 04/03/20  Horald Pollen, MD  methylphenidate (RITALIN) 5 MG tablet Take 1 tablet (5 mg total) by mouth 2 (two) times daily. Patient not taking: Reported on 12/29/2019 10/29/19 01/01/20  Izora Ribas, MD  traZODone (DESYREL) 100 MG tablet Take 1 tablet (100 mg total) by mouth at bedtime. Patient not taking: Reported on 12/29/2019 10/19/19 01/01/20  Angiulli, Lavon Paganini, PA-C    Allergies  Allergen Reactions  . Amoxicillin Itching    Did it involve swelling of the face/tongue/throat, SOB, or low BP? No Did it involve sudden or severe rash/hives, skin peeling, or any reaction on the inside of your mouth or nose? No Did you need to seek medical attention at a hospital or  doctor's office? No When did it last happen?within the past 10 years If all above answers are "NO", may proceed with cephalosporin use.   Marland Kitchen Lisinopril Swelling    Angioedema   . Hydrocodone Other (See Comments)    Depressed   . Tegretol [Carbamazepine] Swelling    Throat swells    Patient Active Problem List   Diagnosis Date Noted  . HLD (hyperlipidemia) 10/12/2019  . CKD (chronic kidney disease) stage 3, GFR 30-59 ml/min 10/12/2019  . Chronic combined systolic and diastolic heart failure ([place])   . Depression 07/14/2019  . Bipolar disorder ([place]) 05/13/2019  . Bipolar 1 disorder ([place]) 05/13/2019  . Adjustment disorder with anxiety   . History of recent stroke 08/06/2017  . OSA (obstructive  sleep apnea)   . Controlled type 2 diabetes mellitus without complication, without long-term current use of insulin ([place]) 05/08/2017  . Essential hypertension 07/27/2008  . Seizure disorder ([place]) 02/05/2007    Past Medical History:  Diagnosis Date  . Anemia   . Anxiety   . Bipolar 1 disorder ([place])   . Common migraine 05/19/2015  . Depression   . Diabetes mellitus, type II ([place])   . Hypertension   . Irritable bowel syndrome (IBS)   . Mild mental retardation   . Obesity   . Partial complex seizure disorder with intractable epilepsy ([place]) 05/12/2014  . Seizures ([place])    intractable, sz 08/23/17  . Sleep apnea   . Stroke ([place])   . Type II or unspecified type diabetes mellitus without mention of complication, not stated as uncontrolled     Past Surgical History:  Procedure Laterality Date  . BUBBLE STUDY  11/15/2019   Procedure: BUBBLE STUDY;  Surgeon: Dorothy Spark, MD;  Location: [facility];  Service: Cardiovascular;;  . COLONOSCOPY     2012-normal , Dr Sharlett Iles  . ESOPHAGOGASTRODUODENOSCOPY     normal-Dr Patterson 2012  . LOOP RECORDER INSERTION N/A 05/30/2017   Procedure: Loop Recorder Insertion;  Surgeon: Constance Haw, MD;  Location: [facility];  Service: Cardiovascular;  Laterality: N/A;  . LOOP RECORDER REMOVAL N/A 03/04/2018   Procedure: LOOP RECORDER REMOVAL;  Surgeon: Constance Haw, MD;  Location: [facility];  Service: Cardiovascular;  Laterality: N/A;  . MYRINGOTOMY WITH TUBE PLACEMENT    . MYRINGOTOMY WITH TUBE PLACEMENT Right 11/05/2017   Procedure: MYRINGOTOMY WITH TUBE PLACEMENT;  Surgeon: Melissa Montane, MD;  Location: [facility];  Service: ENT;  Laterality: Right;  right T Tube placement  . NASAL SINUS SURGERY    . TEE WITHOUT CARDIOVERSION N/A 05/30/2017   Procedure: TRANSESOPHAGEAL ECHOCARDIOGRAM (TEE);  Surgeon: Acie Fredrickson Wonda Cheng, MD;  Location: [facility];  Service: Cardiovascular;  Laterality: N/A;  . TEE WITHOUT CARDIOVERSION N/A  11/15/2019   Procedure: TRANSESOPHAGEAL ECHOCARDIOGRAM (TEE);  Surgeon: Dorothy Spark, MD;  Location: [facility] ENDOSCOPY;  Service: Cardiovascular;  Laterality: N/A;    Social History   Socioeconomic History  . Marital status: Single    Spouse name: [name]  . Number of children: 3  . Years of education: 80  . Highest education level: Not on file  Occupational History  . Occupation: disabled    Employer: DISABLED  Tobacco Use  . Smoking status: Never Smoker  . Smokeless tobacco: Never Used  Substance and Sexual Activity  . Alcohol use: No  . Drug use: No  . Sexual activity: Never  Other Topics Concern  . Not on file  Social History Narrative  Patient lives at home with daughter.    Patient has 3 children.    Patient is right handed.    Patient has a high school education.    Patient is on disability   Patient drinks 2 cups of caffeine daily.   Social Determinants of Health   Financial Resource Strain:   . Difficulty of Paying Living Expenses:   Food Insecurity: No Food Insecurity  . Worried About Charity fundraiser in the Last Year: Never true  . Ran Out of Food in the Last Year: Never true  Transportation Needs: No Transportation Needs  . Lack of Transportation (Medical): No  . Lack of Transportation (Non-Medical): No  Physical Activity:   . Days of Exercise per Week:   . Minutes of Exercise per Session:   Stress:   . Feeling of Stress :   Social Connections:   . Frequency of Communication with Friends and Family:   . Frequency of Social Gatherings with Friends and Family:   . Attends Religious Services:   . Active Member of Clubs or Organizations:   . Attends Archivist Meetings:   Marland Kitchen Marital Status:   Intimate Partner Violence:   . Fear of Current or Ex-Partner:   . Emotionally Abused:   Marland Kitchen Physically Abused:   . Sexually Abused:     Family History  Problem Relation Age of Onset  . Diabetes Mother        passed away from accidental death  .  Hypertension Mother   . Bipolar disorder Father   . Diabetes Daughter   . Leukemia Daughter   . Ovarian cancer Maternal Aunt      Review of Systems  Constitutional: Negative for chills and fever.       Weight gain  HENT: Negative.  Negative for congestion and sore throat.   Respiratory: Negative.  Negative for cough and shortness of breath.   Cardiovascular: Negative.  Negative for chest pain and palpitations.  Gastrointestinal: Negative.  Negative for abdominal pain, blood in stool, diarrhea, melena, nausea and vomiting.  Genitourinary: Negative.  Negative for dysuria.  Musculoskeletal: Positive for joint pain (Mostly right knee).  Skin: Negative.  Negative for rash.  Neurological: Negative.  Negative for dizziness and headaches.  All other systems reviewed and are negative.  Today's Vitals   04/18/20 1025  BP: 111/65  Pulse: 71  Resp: 16  Temp: 97.7 F (36.5 C)  TempSrc: Temporal  SpO2: 97%  Weight: 296 lb (134.3 kg)  Height: 5\' 1"  (1.549 m)   Body mass index is 55.93 kg/m.   Physical Exam Vitals reviewed.  Constitutional:      Appearance: She is obese.  HENT:     Head: Normocephalic.  Eyes:     Extraocular Movements: Extraocular movements intact.     Pupils: Pupils are equal, round, and reactive to light.  Cardiovascular:     Rate and Rhythm: Normal rate and regular rhythm.     Heart sounds: Normal heart sounds.  Pulmonary:     Effort: Pulmonary effort is normal.     Breath sounds: Normal breath sounds.  Musculoskeletal:        General: No swelling, tenderness or deformity.     Cervical back: Normal range of motion.     Right lower leg: No edema.     Left lower leg: No edema.     Comments: Right knee: No erythema or bruising.  No significant swelling.  No tenderness.  Full range of  motion.  Skin:    General: Skin is warm and dry.  Neurological:     General: No focal deficit present.     Mental Status: She is alert and oriented to person, place, and  time.  Psychiatric:        Mood and Affect: Mood normal.        Behavior: Behavior normal.      ASSESSMENT & PLAN: Aubryana was seen today for diabetes.  Diagnoses and all orders for this visit:  Hypertension associated with diabetes ([place]) -     CBC with Differential/Platelet -     Comprehensive metabolic panel -     Lipid panel -     Hemoglobin A1c -     Microalbumin, urine -     HM Diabetes Foot Exam  Chronic combined systolic and diastolic heart failure ([facility])  Morbid (severe) obesity due to excess calories ([facility])  Body mass index (BMI) of 50-59.9 in adult [facility])  Stage 3a chronic kidney disease  History of seizure disorder  History of stroke  Colon cancer screening -     Ambulatory referral to Gastroenterology  Encounter for screening mammogram for malignant neoplasm of breast -     MM Digital Screening; Future  Chronic pain of right knee    Patient Instructions       If you have lab work done today you will be contacted with your lab results within the next 2 weeks.  If you have not heard from [place] then please contact us. The fastest way to get your results is to register for My Chart.   IF you received an x-ray today, you will receive an invoice from [facility]. Please contact [facility] at [phone] with questions or concerns regarding your invoice.   IF you received labwork today, you will receive an invoice from [facility]. Please contact [facility] at [phone] with questions or concerns regarding your invoice.   Our billing staff will not be able to assist you with questions regarding bills from these companies.  You will be contacted with the lab results as soon as they are available. The fastest way to get your results is to activate your My Chart account. Instructions are located on the last page of this paperwork. If you have not heard from [place] regarding the results in 2 weeks, please contact this office.      Calorie  Counting for Weight Loss Calories are units of energy. Your body needs a certain amount of calories from food to keep you going throughout the day. When you eat more calories than your body needs, your body stores the extra calories as fat. When you eat fewer calories than your body needs, your body burns fat to get the energy it needs. Calorie counting means keeping track of how many calories you eat and drink each day. Calorie counting can be helpful if you need to lose weight. If you make sure to eat fewer calories than your body needs, you should lose weight. Ask your health care provider what a healthy weight is for you. For calorie counting to work, you will need to eat the right number of calories in a day in order to lose a healthy amount of weight per week. A dietitian can help you determine how many calories you need in a day and will give you suggestions on how to reach your calorie goal.  A healthy amount of weight to lose per week is usually 1-2 lb (0.5-0.9 kg). This  usually means that your daily calorie intake should be reduced by 500-750 calories.  Eating 1,200 - 1,500 calories per day can help most women lose weight.  Eating 1,500 - 1,800 calories per day can help most men lose weight. What is my plan? My goal is to have __________ calories per day. If I have this many calories per day, I should lose around __________ pounds per week. What do I need to know about calorie counting? In order to meet your daily calorie goal, you will need to:  Find out how many calories are in each food you would like to eat. Try to do this before you eat.  Decide how much of the food you plan to eat.  Write down what you ate and how many calories it had. Doing this is called keeping a food log. To successfully lose weight, it is important to balance calorie counting with a healthy lifestyle that includes regular activity. Aim for 150 minutes of moderate exercise (such as walking) or 75 minutes of  vigorous exercise (such as running) each week. Where do I find calorie information?  The number of calories in a food can be found on a Nutrition Facts label. If a food does not have a Nutrition Facts label, try to look up the calories online or ask your dietitian for help. Remember that calories are listed per serving. If you choose to have more than one serving of a food, you will have to multiply the calories per serving by the amount of servings you plan to eat. For example, the label on a package of bread might say that a serving size is 1 slice and that there are 90 calories in a serving. If you eat 1 slice, you will have eaten 90 calories. If you eat 2 slices, you will have eaten 180 calories. How do I keep a food log? Immediately after each meal, record the following information in your food log:  What you ate. Don't forget to include toppings, sauces, and other extras on the food.  How much you ate. This can be measured in cups, ounces, or number of items.  How many calories each food and drink had.  The total number of calories in the meal. Keep your food log near you, such as in a small notebook in your pocket, or use a mobile app or website. Some programs will calculate calories for you and show you how many calories you have left for the day to meet your goal. What are some calorie counting tips?   Use your calories on foods and drinks that will fill you up and not leave you hungry: ? Some examples of foods that fill you up are nuts and nut butters, vegetables, lean proteins, and high-fiber foods like whole grains. High-fiber foods are foods with more than 5 g fiber per serving. ? Drinks such as sodas, specialty coffee drinks, alcohol, and juices have a lot of calories, yet do not fill you up.  Eat nutritious foods and avoid empty calories. Empty calories are calories you get from foods or beverages that do not have many vitamins or protein, such as candy, sweets, and soda. It is  better to have a nutritious high-calorie food (such as an avocado) than a food with few nutrients (such as a bag of chips).  Know how many calories are in the foods you eat most often. This will help you calculate calorie counts faster.  Pay attention to calories in drinks. Low-calorie drinks  include water and unsweetened drinks.  Pay attention to nutrition labels for "low fat" or "fat free" foods. These foods sometimes have the same amount of calories or more calories than the full fat versions. They also often have added sugar, starch, or salt, to make up for flavor that was removed with the fat.  Find a way of tracking calories that works for you. Get creative. Try different apps or programs if writing down calories does not work for you. What are some portion control tips?  Know how many calories are in a serving. This will help you know how many servings of a certain food you can have.  Use a measuring cup to measure serving sizes. You could also try weighing out portions on a kitchen scale. With time, you will be able to estimate serving sizes for some foods.  Take some time to put servings of different foods on your favorite plates, bowls, and cups so you know what a serving looks like.  Try not to eat straight from a bag or box. Doing this can lead to overeating. Put the amount you would like to eat in a cup or on a plate to make sure you are eating the right portion.  Use smaller plates, glasses, and bowls to prevent overeating.  Try not to multitask (for example, watch TV or use your computer) while eating. If it is time to eat, sit down at a table and enjoy your food. This will help you to know when you are full. It will also help you to be aware of what you are eating and how much you are eating. What are tips for following this plan? Reading food labels  Check the calorie count compared to the serving size. The serving size may be smaller than what you are used to  eating.  Check the source of the calories. Make sure the food you are eating is high in vitamins and protein and low in saturated and trans fats. Shopping  Read nutrition labels while you shop. This will help you make healthy decisions before you decide to purchase your food.  Make a grocery list and stick to it. Cooking  Try to cook your favorite foods in a healthier way. For example, try baking instead of frying.  Use low-fat dairy products. Meal planning  Use more fruits and vegetables. Half of your plate should be fruits and vegetables.  Include lean proteins like poultry and fish. How do I count calories when eating out?  Ask for smaller portion sizes.  Consider sharing an entree and sides instead of getting your own entree.  If you get your own entree, eat only half. Ask for a box at the beginning of your meal and put the rest of your entree in it so you are not tempted to eat it.  If calories are listed on the menu, choose the lower calorie options.  Choose dishes that include vegetables, fruits, whole grains, low-fat dairy products, and lean protein.  Choose items that are boiled, broiled, grilled, or steamed. Stay away from items that are buttered, battered, fried, or served with cream sauce. Items labeled "crispy" are usually fried, unless stated otherwise.  Choose water, low-fat milk, unsweetened iced tea, or other drinks without added sugar. If you want an alcoholic beverage, choose a lower calorie option such as a glass of wine or light beer.  Ask for dressings, sauces, and syrups on the side. These are usually high in calories, so you should limit  the amount you eat.  If you want a salad, choose a garden salad and ask for grilled meats. Avoid extra toppings like bacon, cheese, or fried items. Ask for the dressing on the side, or ask for olive oil and vinegar or lemon to use as dressing.  Estimate how many servings of a food you are given. For example, a serving of  cooked rice is  cup or about the size of half a baseball. Knowing serving sizes will help you be aware of how much food you are eating at restaurants. The list below tells you how big or small some common portion sizes are based on everyday objects: ? 1 oz--4 stacked dice. ? 3 oz--1 deck of cards. ? 1 tsp--1 die. ? 1 Tbsp-- a ping-pong ball. ? 2 Tbsp--1 ping-pong ball. ?  cup-- baseball. ? 1 cup--1 baseball. Summary  Calorie counting means keeping track of how many calories you eat and drink each day. If you eat fewer calories than your body needs, you should lose weight.  A healthy amount of weight to lose per week is usually 1-2 lb (0.5-0.9 kg). This usually means reducing your daily calorie intake by 500-750 calories.  The number of calories in a food can be found on a Nutrition Facts label. If a food does not have a Nutrition Facts label, try to look up the calories online or ask your dietitian for help.  Use your calories on foods and drinks that will fill you up, and not on foods and drinks that will leave you hungry.  Use smaller plates, glasses, and bowls to prevent overeating. This information is not intended to replace advice given to you by your health care provider. Make sure you discuss any questions you have with your health care provider. Document Revised: 08/14/2018 Document Reviewed: 10/25/2016 Elsevier Patient Education  2020 [employer]      Agustina Caroli, MD [facility]

## 2020-04-18 NOTE — Patient Instructions (Addendum)
If you have lab work done today you will be contacted with your lab results within the next 2 weeks.  If you have not heard from [place] then please contact us. The fastest way to get your results is to register for My Chart.   IF you received an x-ray today, you will receive an invoice from [facility]. Please contact [facility] at [phone] with questions or concerns regarding your invoice.   IF you received labwork today, you will receive an invoice from [facility]. Please contact [facility] at [phone] with questions or concerns regarding your invoice.   Our billing staff will not be able to assist you with questions regarding bills from these companies.  You will be contacted with the lab results as soon as they are available. The fastest way to get your results is to activate your My Chart account. Instructions are located on the last page of this paperwork. If you have not heard from [place] regarding the results in 2 weeks, please contact this office.      Calorie Counting for Weight Loss Calories are units of energy. Your body needs a certain amount of calories from food to keep you going throughout the day. When you eat more calories than your body needs, your body stores the extra calories as fat. When you eat fewer calories than your body needs, your body burns fat to get the energy it needs. Calorie counting means keeping track of how many calories you eat and drink each day. Calorie counting can be helpful if you need to lose weight. If you make sure to eat fewer calories than your body needs, you should lose weight. Ask your health care provider what a healthy weight is for you. For calorie counting to work, you will need to eat the right number of calories in a day in order to lose a healthy amount of weight per week. A dietitian can help you determine how many calories you need in a day and will give you suggestions on how to reach your calorie goal.  A healthy  amount of weight to lose per week is usually 1-2 lb (0.5-0.9 kg). This usually means that your daily calorie intake should be reduced by 500-750 calories.  Eating 1,200 - 1,500 calories per day can help most women lose weight.  Eating 1,500 - 1,800 calories per day can help most men lose weight. What is my plan? My goal is to have __________ calories per day. If I have this many calories per day, I should lose around __________ pounds per week. What do I need to know about calorie counting? In order to meet your daily calorie goal, you will need to:  Find out how many calories are in each food you would like to eat. Try to do this before you eat.  Decide how much of the food you plan to eat.  Write down what you ate and how many calories it had. Doing this is called keeping a food log. To successfully lose weight, it is important to balance calorie counting with a healthy lifestyle that includes regular activity. Aim for 150 minutes of moderate exercise (such as walking) or 75 minutes of vigorous exercise (such as running) each week. Where do I find calorie information?  The number of calories in a food can be found on a Nutrition Facts label. If a food does not have a Nutrition Facts label, try to look up the calories online or ask your dietitian  for help. Remember that calories are listed per serving. If you choose to have more than one serving of a food, you will have to multiply the calories per serving by the amount of servings you plan to eat. For example, the label on a package of bread might say that a serving size is 1 slice and that there are 90 calories in a serving. If you eat 1 slice, you will have eaten 90 calories. If you eat 2 slices, you will have eaten 180 calories. How do I keep a food log? Immediately after each meal, record the following information in your food log:  What you ate. Don't forget to include toppings, sauces, and other extras on the food.  How much you  ate. This can be measured in cups, ounces, or number of items.  How many calories each food and drink had.  The total number of calories in the meal. Keep your food log near you, such as in a small notebook in your pocket, or use a mobile app or website. Some programs will calculate calories for you and show you how many calories you have left for the day to meet your goal. What are some calorie counting tips?   Use your calories on foods and drinks that will fill you up and not leave you hungry: ? Some examples of foods that fill you up are nuts and nut butters, vegetables, lean proteins, and high-fiber foods like whole grains. High-fiber foods are foods with more than 5 g fiber per serving. ? Drinks such as sodas, specialty coffee drinks, alcohol, and juices have a lot of calories, yet do not fill you up.  Eat nutritious foods and avoid empty calories. Empty calories are calories you get from foods or beverages that do not have many vitamins or protein, such as candy, sweets, and soda. It is better to have a nutritious high-calorie food (such as an avocado) than a food with few nutrients (such as a bag of chips).  Know how many calories are in the foods you eat most often. This will help you calculate calorie counts faster.  Pay attention to calories in drinks. Low-calorie drinks include water and unsweetened drinks.  Pay attention to nutrition labels for "low fat" or "fat free" foods. These foods sometimes have the same amount of calories or more calories than the full fat versions. They also often have added sugar, starch, or salt, to make up for flavor that was removed with the fat.  Find a way of tracking calories that works for you. Get creative. Try different apps or programs if writing down calories does not work for you. What are some portion control tips?  Know how many calories are in a serving. This will help you know how many servings of a certain food you can have.  Use a  measuring cup to measure serving sizes. You could also try weighing out portions on a kitchen scale. With time, you will be able to estimate serving sizes for some foods.  Take some time to put servings of different foods on your favorite plates, bowls, and cups so you know what a serving looks like.  Try not to eat straight from a bag or box. Doing this can lead to overeating. Put the amount you would like to eat in a cup or on a plate to make sure you are eating the right portion.  Use smaller plates, glasses, and bowls to prevent overeating.  Try not to multitask (for  example, watch TV or use your computer) while eating. If it is time to eat, sit down at a table and enjoy your food. This will help you to know when you are full. It will also help you to be aware of what you are eating and how much you are eating. What are tips for following this plan? Reading food labels  Check the calorie count compared to the serving size. The serving size may be smaller than what you are used to eating.  Check the source of the calories. Make sure the food you are eating is high in vitamins and protein and low in saturated and trans fats. Shopping  Read nutrition labels while you shop. This will help you make healthy decisions before you decide to purchase your food.  Make a grocery list and stick to it. Cooking  Try to cook your favorite foods in a healthier way. For example, try baking instead of frying.  Use low-fat dairy products. Meal planning  Use more fruits and vegetables. Half of your plate should be fruits and vegetables.  Include lean proteins like poultry and fish. How do I count calories when eating out?  Ask for smaller portion sizes.  Consider sharing an entree and sides instead of getting your own entree.  If you get your own entree, eat only half. Ask for a box at the beginning of your meal and put the rest of your entree in it so you are not tempted to eat it.  If calories  are listed on the menu, choose the lower calorie options.  Choose dishes that include vegetables, fruits, whole grains, low-fat dairy products, and lean protein.  Choose items that are boiled, broiled, grilled, or steamed. Stay away from items that are buttered, battered, fried, or served with cream sauce. Items labeled "crispy" are usually fried, unless stated otherwise.  Choose water, low-fat milk, unsweetened iced tea, or other drinks without added sugar. If you want an alcoholic beverage, choose a lower calorie option such as a glass of wine or light beer.  Ask for dressings, sauces, and syrups on the side. These are usually high in calories, so you should limit the amount you eat.  If you want a salad, choose a garden salad and ask for grilled meats. Avoid extra toppings like bacon, cheese, or fried items. Ask for the dressing on the side, or ask for olive oil and vinegar or lemon to use as dressing.  Estimate how many servings of a food you are given. For example, a serving of cooked rice is  cup or about the size of half a baseball. Knowing serving sizes will help you be aware of how much food you are eating at restaurants. The list below tells you how big or small some common portion sizes are based on everyday objects: ? 1 oz--4 stacked dice. ? 3 oz--1 deck of cards. ? 1 tsp--1 die. ? 1 Tbsp-- a ping-pong ball. ? 2 Tbsp--1 ping-pong ball. ?  cup-- baseball. ? 1 cup--1 baseball. Summary  Calorie counting means keeping track of how many calories you eat and drink each day. If you eat fewer calories than your body needs, you should lose weight.  A healthy amount of weight to lose per week is usually 1-2 lb (0.5-0.9 kg). This usually means reducing your daily calorie intake by 500-750 calories.  The number of calories in a food can be found on a Nutrition Facts label. If a food does not have a Nutrition  Facts label, try to look up the calories online or ask your dietitian for  help.  Use your calories on foods and drinks that will fill you up, and not on foods and drinks that will leave you hungry.  Use smaller plates, glasses, and bowls to prevent overeating. This information is not intended to replace advice given to you by your health care provider. Make sure you discuss any questions you have with your health care provider. Document Revised: 08/14/2018 Document Reviewed: 10/25/2016 Elsevier Patient Education  [place].

## 2020-04-19 ENCOUNTER — Other Ambulatory Visit: Payer: Self-pay | Admitting: Emergency Medicine

## 2020-04-19 DIAGNOSIS — E1159 Type 2 diabetes mellitus with other circulatory complications: Secondary | ICD-10-CM

## 2020-04-19 LAB — CBC WITH DIFFERENTIAL/PLATELET
Basophils Absolute: 0 10*3/uL (ref 0.0–0.2)
Basos: 0 %
EOS (ABSOLUTE): 0.3 10*3/uL (ref 0.0–0.4)
Eos: 4 %
Hematocrit: 30.1 % — ABNORMAL LOW (ref 34.0–46.6)
Hemoglobin: 9.4 g/dL — ABNORMAL LOW (ref 11.1–15.9)
Immature Grans (Abs): 0 10*3/uL (ref 0.0–0.1)
Immature Granulocytes: 1 %
Lymphocytes Absolute: 2 10*3/uL (ref 0.7–3.1)
Lymphs: 30 %
MCH: 26.7 pg (ref 26.6–33.0)
MCHC: 31.2 g/dL — ABNORMAL LOW (ref 31.5–35.7)
MCV: 86 fL (ref 79–97)
Monocytes Absolute: 0.4 10*3/uL (ref 0.1–0.9)
Monocytes: 6 %
Neutrophils Absolute: 4 10*3/uL (ref 1.4–7.0)
Neutrophils: 59 %
Platelets: 311 10*3/uL (ref 150–450)
RBC: 3.52 x10E6/uL — ABNORMAL LOW (ref 3.77–5.28)
RDW: 14.1 % (ref 11.7–15.4)
WBC: 6.7 10*3/uL (ref 3.4–10.8)

## 2020-04-19 LAB — MICROALBUMIN, URINE: Microalbumin, Urine: 155.1 ug/mL

## 2020-04-19 LAB — COMPREHENSIVE METABOLIC PANEL
ALT: 10 IU/L (ref 0–32)
AST: 10 IU/L (ref 0–40)
Albumin/Globulin Ratio: 1.2 (ref 1.2–2.2)
Albumin: 3.5 g/dL — ABNORMAL LOW (ref 3.8–4.9)
Alkaline Phosphatase: 115 IU/L (ref 39–117)
BUN/Creatinine Ratio: 11 (ref 9–23)
BUN: 14 mg/dL (ref 6–24)
Bilirubin Total: 0.3 mg/dL (ref 0.0–1.2)
CO2: 23 mmol/L (ref 20–29)
Calcium: 8.7 mg/dL (ref 8.7–10.2)
Chloride: 103 mmol/L (ref 96–106)
Creatinine, Ser: 1.23 mg/dL — ABNORMAL HIGH (ref 0.57–1.00)
GFR calc Af Amer: 59 mL/min/{1.73_m2} — ABNORMAL LOW (ref >59)
GFR calc non Af Amer: 51 mL/min/{1.73_m2} — ABNORMAL LOW (ref >59)
Globulin, Total: 2.9 g/dL (ref 1.5–4.5)
Glucose: 160 mg/dL — ABNORMAL HIGH (ref 65–99)
Potassium: 4.3 mmol/L (ref 3.5–5.2)
Sodium: 138 mmol/L (ref 134–144)
Total Protein: 6.4 g/dL (ref 6.0–8.5)

## 2020-04-19 LAB — LIPID PANEL
Chol/HDL Ratio: 2.1 ratio (ref 0.0–4.4)
Cholesterol, Total: 108 mg/dL (ref 100–199)
HDL: 52 mg/dL (ref >39)
LDL Chol Calc (NIH): 44 mg/dL (ref 0–99)
Triglycerides: 52 mg/dL (ref 0–149)
VLDL Cholesterol Cal: 12 mg/dL (ref 5–40)

## 2020-04-19 LAB — HEMOGLOBIN A1C
Est. average glucose Bld gHb Est-mCnc: 163 mg/dL
Hgb A1c MFr Bld: 7.3 % — ABNORMAL HIGH (ref 4.8–5.6)

## 2020-04-19 MED ORDER — GLIPIZIDE 5 MG PO TABS: 5.0000 mg | ORAL_TABLET | Freq: Two times a day (BID) | ORAL | 1 refills | Status: DC

## 2020-05-02 ENCOUNTER — Other Ambulatory Visit: Payer: Self-pay

## 2020-05-02 ENCOUNTER — Ambulatory Visit (INDEPENDENT_AMBULATORY_CARE_PROVIDER_SITE_OTHER): Payer: Medicare HMO | Admitting: Neurology

## 2020-05-02 ENCOUNTER — Encounter: Payer: Self-pay | Admitting: Neurology

## 2020-05-02 VITALS — BP 108/64 | HR 73 | Ht 61.0 in | Wt 297.5 lb

## 2020-05-02 DIAGNOSIS — G40909 Epilepsy, unspecified, not intractable, without status epilepticus: Secondary | ICD-10-CM

## 2020-05-02 MED ORDER — LAMOTRIGINE 150 MG PO TABS: 150.0000 mg | ORAL_TABLET | Freq: Two times a day (BID) | ORAL | 3 refills | Status: DC

## 2020-05-02 NOTE — Progress Notes (Signed)
Reason for visit: Seizures  Brandi Gomez is an 49 y.o. female  History of present illness:  Ms. Tinnell is a 49 year old right-handed black female with a history of intractable seizures.  In the past, the patient has been on Keppra, Vimpat, and carbamazepine and eventually was switched to Lamictal which seems to be well-tolerated and effective.  The patient has a history of a right posterior frontal lobe cortical infarction.  She is followed through psychiatry, she is on Abilify currently.  She reports an occasional tremor while holding objects with her left hand.  Her last seizure event was on 05 January 2020.  She returns for an evaluation.  She currently is on lamotrigine 100 mg in the morning and 150 mg in the evening, she never went up to 150 mg twice daily as instructed.  Past Medical History:  Diagnosis Date  . Anemia   . Anxiety   . Bipolar 1 disorder ([place])   . Common migraine 05/19/2015  . Depression   . Diabetes mellitus, type II ([place])   . Hypertension   . Irritable bowel syndrome (IBS)   . Mild mental retardation   . Obesity   . Partial complex seizure disorder with intractable epilepsy ([place]) 05/12/2014  . Seizures ([place])    intractable, sz 08/23/17  . Sleep apnea   . Stroke ([place])   . Type II or unspecified type diabetes mellitus without mention of complication, not stated as uncontrolled     Past Surgical History:  Procedure Laterality Date  . BUBBLE STUDY  11/15/2019   Procedure: BUBBLE STUDY;  Surgeon: Dorothy Spark, MD;  Location: [facility];  Service: Cardiovascular;;  . COLONOSCOPY     2012-normal , Dr Sharlett Iles  . ESOPHAGOGASTRODUODENOSCOPY     normal-Dr Patterson 2012  . LOOP RECORDER INSERTION N/A 05/30/2017   Procedure: Loop Recorder Insertion;  Surgeon: Constance Haw, MD;  Location: [facility];  Service: Cardiovascular;  Laterality: N/A;  . LOOP RECORDER REMOVAL N/A 03/04/2018   Procedure: LOOP RECORDER REMOVAL;  Surgeon: Constance Haw, MD;  Location: [facility];  Service: Cardiovascular;  Laterality: N/A;  . MYRINGOTOMY WITH TUBE PLACEMENT    . MYRINGOTOMY WITH TUBE PLACEMENT Right 11/05/2017   Procedure: MYRINGOTOMY WITH TUBE PLACEMENT;  Surgeon: Melissa Montane, MD;  Location: [facility];  Service: ENT;  Laterality: Right;  right T Tube placement  . NASAL SINUS SURGERY    . TEE WITHOUT CARDIOVERSION N/A 05/30/2017   Procedure: TRANSESOPHAGEAL ECHOCARDIOGRAM (TEE);  Surgeon: Acie Fredrickson Wonda Cheng, MD;  Location: [facility];  Service: Cardiovascular;  Laterality: N/A;  . TEE WITHOUT CARDIOVERSION N/A 11/15/2019   Procedure: TRANSESOPHAGEAL ECHOCARDIOGRAM (TEE);  Surgeon: Dorothy Spark, MD;  Location: [facility];  Service: Cardiovascular;  Laterality: N/A;    Family History  Problem Relation Age of Onset  . Diabetes Mother        passed away from accidental death  . Hypertension Mother   . Bipolar disorder Father   . Diabetes Daughter   . Leukemia Daughter   . Ovarian cancer Maternal Aunt     Social history:  reports that she has never smoked. She has never used smokeless tobacco. She reports that she does not drink alcohol or use drugs.    Allergies  Allergen Reactions  . Amoxicillin Itching    Did it involve swelling of the face/tongue/throat, SOB, or low BP? No Did it involve sudden or severe rash/hives, skin peeling, or any reaction  on the inside of your mouth or nose? No Did you need to seek medical attention at a hospital or doctor's office? No When did it last happen?within the past 10 years If all above answers are "NO", may proceed with cephalosporin use.   Marland Kitchen Lisinopril Swelling    Angioedema   . Hydrocodone Other (See Comments)    Depressed   . Tegretol [Carbamazepine] Swelling    Throat swells    Medications:  Prior to Admission medications   Medication Sig Start Date End Date Taking? Authorizing Provider  acetaminophen (TYLENOL) 325 MG tablet Take 2 tablets (650 mg total)  by mouth every 4 (four) hours as needed for mild pain (or temp > 37.5 C (99.5 F)). 10/19/19  Yes Angiulli, Lavon Paganini, PA-C  ARIPiprazole (ABILIFY) 10 MG tablet Take 1 tablet (10 mg total) by mouth daily. 03/31/20 03/31/21 Yes Plovsky, Berneta Sages, MD  atorvastatin (LIPITOR) 40 MG tablet Take 1 tablet (40 mg total) by mouth daily at 6 PM. 01/04/20 05/02/20 Yes Sagardia, Ines Bloomer, MD  carvedilol (COREG) 3.125 MG tablet Take 1 tablet (3.125 mg total) by mouth 2 (two) times daily. 01/04/20 05/02/20 Yes Sagardia, Ines Bloomer, MD  glipiZIDE (GLUCOTROL) 5 MG tablet Take 1 tablet (5 mg total) by mouth 2 (two) times daily with a meal. 04/19/20 07/18/20 Yes Sagardia, Ines Bloomer, MD  hydrALAZINE (APRESOLINE) 10 MG tablet Take 1 tablet (10 mg total) by mouth 2 (two) times daily. 01/04/20 05/02/20 Yes Sagardia, Ines Bloomer, MD  isosorbide dinitrate (ISORDIL) 10 MG tablet Take 1 tablet (10 mg total) by mouth 2 (two) times daily. 01/04/20 05/02/20 Yes Sagardia, Ines Bloomer, MD  lamoTRIgine (LAMICTAL) 100 MG tablet Take 1 tablet in the morning, 1.5 tablets in the evening x 2 weeks, then take 1.5 tablets twice daily 01/06/20  Yes [name], NP  torsemide (DEMADEX) 10 MG tablet Take 10 mg by mouth daily.   Yes [provider]  methylphenidate (RITALIN) 5 MG tablet Take 1 tablet (5 mg total) by mouth 2 (two) times daily. Patient not taking: Reported on 12/29/2019 10/29/19 01/01/20  Izora Ribas, MD  traZODone (DESYREL) 100 MG tablet Take 1 tablet (100 mg total) by mouth at bedtime. Patient not taking: Reported on 12/29/2019 10/19/19 01/01/20  Angiulli, Lavon Paganini, PA-C    ROS:  Out of a complete 14 system review of symptoms, the patient complains only of the following symptoms, and all other reviewed systems are negative.  Weight gain Leg pain with walking Seizures  Blood pressure 108/64, pulse 73, height 5\' 1"  (1.549 m), weight 297 lb 8 oz (134.9 kg), last menstrual period 04/16/2020.  Physical  Exam  General: The patient is alert and cooperative at the time of the examination.  The patient is markedly obese.  Skin: No significant peripheral edema is noted.   Neurologic Exam  Mental status: The patient is alert and oriented x 3 at the time of the examination. The patient has apparent normal recent and remote memory, with an apparently normal attention span and concentration ability.   Cranial nerves: Facial symmetry is present. Speech is normal, no aphasia or dysarthria is noted. Extraocular movements are full. Visual fields are full.  Motor: The patient has good strength in all 4 extremities.  Sensory examination: Soft touch sensation is symmetric on the face, arms, and legs.  Coordination: The patient has good finger-nose-finger and heel-to-shin bilaterally.  Gait and station: The patient has a normal gait. Tandem gait is slightly unsteady. Romberg is  negative. No drift is seen.  Reflexes: Deep tendon reflexes are symmetric.   Assessment/Plan:  1.  Morbid obesity  2.  Intractable seizures  The patient has done well with Lamictal.  We will increase as planned to the 150 mg twice daily, have sent in a prescription for this.  She will follow-up in 6 months, she will let us know if any further recurrence is noted.  Jill Alexanders MD 05/02/2020 12:13 PM  [facility] [address] [place], [place] [address]  Phone [phone] Fax [phone]

## 2020-05-02 NOTE — Patient Instructions (Signed)
We will increase the Lamictal to 150 mg twice a day.  Lamictal (lamotrigine) is a seizure medication that occasionally may be used for other purposes such as peripheral neuropathy pain or certain types of headache. This medication is relatively safe, but occasionally side effects can occur. A skin rash may occur when first starting the medication. As with any seizure medication, depression may worsen on the drug. Other potential side effects include dizziness, headache, drowsiness or insomnia, decreased concentration, or stomach upset. This medication may also be used as a mood stabilizer. If you believe that you are having side effects on the medication, please contact our office.

## 2020-05-04 DIAGNOSIS — G4733 Obstructive sleep apnea (adult) (pediatric): Secondary | ICD-10-CM | POA: Diagnosis not present

## 2020-05-11 ENCOUNTER — Ambulatory Visit (INDEPENDENT_AMBULATORY_CARE_PROVIDER_SITE_OTHER): Payer: Medicare HMO | Admitting: Family Medicine

## 2020-05-11 ENCOUNTER — Other Ambulatory Visit: Payer: Self-pay

## 2020-05-11 ENCOUNTER — Encounter: Payer: Self-pay | Admitting: Family Medicine

## 2020-05-11 VITALS — BP 132/82 | HR 100 | Ht 61.0 in | Wt 296.0 lb

## 2020-05-11 DIAGNOSIS — G40909 Epilepsy, unspecified, not intractable, without status epilepticus: Secondary | ICD-10-CM | POA: Diagnosis not present

## 2020-05-11 DIAGNOSIS — G4733 Obstructive sleep apnea (adult) (pediatric): Secondary | ICD-10-CM | POA: Diagnosis not present

## 2020-05-11 DIAGNOSIS — Z9989 Dependence on other enabling machines and devices: Secondary | ICD-10-CM

## 2020-05-11 NOTE — Patient Instructions (Addendum)
Please continue using your CPAP regularly. While your insurance requires that you use CPAP at least 4 hours each night on 70% of the nights, I recommend, that you not skip any nights and use it throughout the night if you can. Getting used to CPAP and staying with the treatment long term does take time and patience and discipline. Untreated obstructive sleep apnea when it is moderate to severe can have an adverse impact on cardiovascular health and raise her risk for heart disease, arrhythmias, hypertension, congestive heart failure, stroke and diabetes. Untreated obstructive sleep apnea causes sleep disruption, nonrestorative sleep, and sleep deprivation. This can have an impact on your day to day functioning and cause daytime sleepiness and impairment of cognitive function, memory loss, mood disturbance, and problems focussing. Using CPAP regularly can improve these symptoms.  We will send orders for new CPAP machine and supplies today. Please call [facility] to see if you can get a loaner machine while we get the new machine ordered.   We will follow up 31-90 days following new CPAP set up.   Seizure, Adult A seizure is a sudden burst of abnormal electrical activity in the brain. Seizures usually last from 30 seconds to 2 minutes. They can cause many different symptoms. Usually, seizures are not harmful unless they last a long time. What are the causes? Common causes of this condition include:  Fever or infection.  Conditions that affect the brain, such as: ? A brain abnormality that you were born with. ? A brain or head injury. ? Bleeding in the brain. ? A tumor. ? Stroke. ? Brain disorders such as autism or cerebral palsy.  Low blood sugar.  Conditions that are passed from parent to child (are inherited).  Problems with substances, such as: ? Having a reaction to a drug or a medicine. ? Suddenly stopping the use of a substance (withdrawal). In some cases, the cause may not be known.  A person who has repeated seizures over time without a clear cause has a condition called epilepsy. What increases the risk? You are more likely to get this condition if you have:  A family history of epilepsy.  Had a seizure in the past.  A brain disorder.  A history of head injury, lack of oxygen at birth, or strokes. What are the signs or symptoms? There are many types of seizures. The symptoms vary depending on the type of seizure you have. Examples of symptoms during a seizure include:  Shaking (convulsions).  Stiffness in the body.  Passing out (losing consciousness).  Head nodding.  Staring.  Not responding to sound or touch.  Loss of bladder control and bowel control. Some people have symptoms right before and right after a seizure happens. Symptoms before a seizure may include:  Fear.  Worry (anxiety).  Feeling like you may vomit (nauseous).  Feeling like the room is spinning (vertigo).  Feeling like you saw or heard something before (dj vu).  Odd tastes or smells.  Changes in how you see. You may see flashing lights or spots. Symptoms after a seizure happens can include:  Confusion.  Sleepiness.  Headache.  Weakness on one side of the body. How is this treated? Most seizures will stop on their own in under 5 minutes. In these cases, no treatment is needed. Seizures that last longer than 5 minutes will usually need treatment. Treatment can include:  Medicines given through an IV tube.  Avoiding things that are known to cause your seizures. These  can include medicines that you take for another condition.  Medicines to treat epilepsy.  Surgery to stop the seizures. This may be needed if medicines do not help. Follow these instructions at home: Medicines  Take over-the-counter and prescription medicines only as told by your doctor.  Do not eat or drink anything that may keep your medicine from working, such as alcohol. Activity  Do not do  any activities that would be dangerous if you had another seizure, like driving or swimming. Wait until your doctor says it is safe for you to do them.  If you live in the [place] ask your local DMV (department of motor vehicles) when you can drive.  Get plenty of rest. Teaching others Teach friends and family what to do when you have a seizure. They should:  Lay you on the ground.  Protect your head and body.  Loosen any tight clothing around your neck.  Turn you on your side.  Not hold you down.  Not put anything into your mouth.  Know whether or not you need emergency care.  Stay with you until you are better.  General instructions  Contact your doctor each time you have a seizure.  Avoid anything that gives you seizures.  Keep a seizure diary. Write down: ? What you think caused each seizure. ? What you remember about each seizure.  Keep all follow-up visits as told by your doctor. This is important. Contact a doctor if:  You have another seizure.  You have seizures more often.  There is any change in what happens during your seizures.  You keep having seizures with treatment.  You have symptoms of being sick or having an infection. Get help right away if:  You have a seizure that: ? Lasts longer than 5 minutes. ? Is different than seizures you had before. ? Makes it harder to breathe. ? Happens after you hurt your head.  You have any of these symptoms after a seizure: ? Not being able to speak. ? Not being able to use a part of your body. ? Confusion. ? A bad headache.  You have two or more seizures in a row.  You do not wake up right after a seizure.  You get hurt during a seizure. These symptoms may be an emergency. Do not wait to see if the symptoms will go away. Get medical help right away. Call your local emergency services (911 in the [place] Do not drive yourself to the hospital. Summary  Seizures usually last from 30 seconds to 2 minutes.  Usually, they are not harmful unless they last a long time.  Do not eat or drink anything that may keep your medicine from working, such as alcohol.  Teach friends and family what to do when you have a seizure.  Contact your doctor each time you have a seizure. This information is not intended to replace advice given to you by your health care provider. Make sure you discuss any questions you have with your health care provider. Document Revised: 02/12/2019 Document Reviewed: 02/12/2019 Elsevier Patient Education  [place].   Sleep Apnea Sleep apnea affects breathing during sleep. It causes breathing to stop for a short time or to become shallow. It can also increase the risk of:  Heart attack.  Stroke.  Being very overweight (obese).  Diabetes.  Heart failure.  Irregular heartbeat. The goal of treatment is to help you breathe normally again. What are the causes? There are three kinds of  sleep apnea:  Obstructive sleep apnea. This is caused by a blocked or collapsed airway.  Central sleep apnea. This happens when the brain does not send the right signals to the muscles that control breathing.  Mixed sleep apnea. This is a combination of obstructive and central sleep apnea. The most common cause of this condition is a collapsed or blocked airway. This can happen if:  Your throat muscles are too relaxed.  Your tongue and tonsils are too large.  You are overweight.  Your airway is too small. What increases the risk?  Being overweight.  Smoking.  Having a small airway.  Being older.  Being female.  Drinking alcohol.  Taking medicines to calm yourself (sedatives or tranquilizers).  Having family members with the condition. What are the signs or symptoms?  Trouble staying asleep.  Being sleepy or tired during the day.  Getting angry a lot.  Loud snoring.  Headaches in the morning.  Not being able to focus your mind  (concentrate).  Forgetting things.  Less interest in sex.  Mood swings.  Personality changes.  Feelings of sadness (depression).  Waking up a lot during the night to pee (urinate).  Dry mouth.  Sore throat. How is this diagnosed?  Your medical history.  A physical exam.  A test that is done when you are sleeping (sleep study). The test is most often done in a sleep lab but may also be done at home. How is this treated?   Sleeping on your side.  Using a medicine to get rid of mucus in your nose (decongestant).  Avoiding the use of alcohol, medicines to help you relax, or certain pain medicines (narcotics).  Losing weight, if needed.  Changing your diet.  Not smoking.  Using a machine to open your airway while you sleep, such as: ? An oral appliance. This is a mouthpiece that shifts your lower jaw forward. ? A CPAP device. This device blows air through a mask when you breathe out (exhale). ? An EPAP device. This has valves that you put in each nostril. ? A BPAP device. This device blows air through a mask when you breathe in (inhale) and breathe out.  Having surgery if other treatments do not work. It is important to get treatment for sleep apnea. Without treatment, it can lead to:  High blood pressure.  Coronary artery disease.  In men, not being able to have an erection (impotence).  Reduced thinking ability. Follow these instructions at home: Lifestyle  Make changes that your doctor recommends.  Eat a healthy diet.  Lose weight if needed.  Avoid alcohol, medicines to help you relax, and some pain medicines.  Do not use any products that contain nicotine or tobacco, such as cigarettes, e-cigarettes, and chewing tobacco. If you need help quitting, ask your doctor. General instructions  Take over-the-counter and prescription medicines only as told by your doctor.  If you were given a machine to use while you sleep, use it only as told by your  doctor.  If you are having surgery, make sure to tell your doctor you have sleep apnea. You may need to bring your device with you.  Keep all follow-up visits as told by your doctor. This is important. Contact a doctor if:  The machine that you were given to use during sleep bothers you or does not seem to be working.  You do not get better.  You get worse. Get help right away if:  Your chest hurts.  You have trouble breathing in enough air.  You have an uncomfortable feeling in your back, arms, or stomach.  You have trouble talking.  One side of your body feels weak.  A part of your face is hanging down. These symptoms may be an emergency. Do not wait to see if the symptoms will go away. Get medical help right away. Call your local emergency services (911 in the [place] Do not drive yourself to the hospital. Summary  This condition affects breathing during sleep.  The most common cause is a collapsed or blocked airway.  The goal of treatment is to help you breathe normally while you sleep. This information is not intended to replace advice given to you by your health care provider. Make sure you discuss any questions you have with your health care provider. Document Revised: 09/11/2018 Document Reviewed: 07/21/2018 Elsevier Patient Education  [place].

## 2020-05-11 NOTE — Progress Notes (Signed)
A community message has been sent to [facility]

## 2020-05-11 NOTE — Progress Notes (Addendum)
PATIENT: Brandi Gomez DOB: 19-Aug-1971  REASON FOR VISIT: follow up HISTORY FROM: patient  Chief Complaint  Patient presents with  . Follow-up    sz, cpap, rm 6 , needs new cpap supplies      HISTORY OF PRESENT ILLNESS: Today 05/11/20 Brandi Gomez is a 49 y.o. female here today for follow up of OSA on CPAP. She reports doing well with CPAP therapy. She is using CPAP nightly. She reports that two nights ago, her machine stopped blowing air. She reports that it is making an unusual noise. Her machine is over 40 years old. She does note air coming around her mask as well. She will work on getting mask tighter with new machine and supplies. She was seen by Dr Jannifer Franklin last week for seizure follow up.  Compliance report dated 04/10/2020 through 05/09/2020 reveals that she used CPAP 29 of the last 30 days for compliance of 97%.  She CPAP 28 of the last 30 days for compliance of 93%.  Average usage was 8 hours and 31 minutes.  Residual AHI was 1.9 on 7 to 12 cm of water and an EPR of 1.  There was a very large leak noted in the 95th percentile of 101.6 L/min.    HISTORY: (copied from [place] note on 03/09/2019)  HPI: Brandi Gomez is a well-established patient to this office where she follows with Dr. Rexene Alberts for OSA on CPAP management along with Dr. Jannifer Franklin for seizure disorder management. She was initially scheduled for an 51-month office follow-up visit regarding CPAP compliance but due to [place] pandemic, in office visits are limited therefore rescheduled for telemedicine visit via video.  CPAP compliance report reviewed from 02/06/19-03/07/19 which showed 30 out of 30 usage days with greater than 4 hour usage of 26 days for 87% compliance with average usage 5 hours and 44 minutes.  Residual AHI 0.9 with leaks in the 95th percentile 38.5 L/min and pressure in the 95th percentile 11.7 cm H2O on 7 cm H2O minimum pressure and 12 cm H2O max pressure with a EPR level 1.  She reports  overall good use of her CPAP and experiences minimal daytime fatigue.  She does report obtaining a different mask while in the ED a couple months ago and has been using the mask since then and has been experiencing leaking around the chin area.  She attempts to readjust the straps but she feels as though there is still consistently.  She has not attempted to reach out to her DME company at this time.   Of note, she does endorse worsening of memory and concentration which has been ongoing for many years.  She plans on speaking to Dr. Jannifer Franklin at follow-up visit regarding her memory as scheduled appointment in 05/2019.  Confirmed all current medications, allergy list and medical history as correct   REVIEW OF SYSTEMS: Out of a complete 14 system review of symptoms, the patient complains only of the following symptoms, seizures, fatigue, chronic pain, memory loss and all other reviewed systems are negative.  ESS: Epworth SS: 63  ALLERGIES: Allergies  Allergen Reactions  . Amoxicillin Itching    Did it involve swelling of the face/tongue/throat, SOB, or low BP? No Did it involve sudden or severe rash/hives, skin peeling, or any reaction on the inside of your mouth or nose? No Did you need to seek medical attention at a hospital or doctor's office? No When did it last happen?within the past 10 years If  all above answers are "NO", may proceed with cephalosporin use.   Marland Kitchen Lisinopril Swelling    Angioedema   . Hydrocodone Other (See Comments)    Depressed   . Tegretol [Carbamazepine] Swelling    Throat swells    HOME MEDICATIONS: Outpatient Medications Prior to Visit  Medication Sig Dispense Refill  . acetaminophen (TYLENOL) 325 MG tablet Take 2 tablets (650 mg total) by mouth every 4 (four) hours as needed for mild pain (or temp > 37.5 C (99.5 F)).    Marland Kitchen ARIPiprazole (ABILIFY) 10 MG tablet Take 1 tablet (10 mg total) by mouth daily. 30 tablet 2  . glipiZIDE (GLUCOTROL) 5 MG tablet Take 1  tablet (5 mg total) by mouth 2 (two) times daily with a meal. 180 tablet 1  . lamoTRIgine (LAMICTAL) 150 MG tablet Take 1 tablet (150 mg total) by mouth 2 (two) times daily. 180 tablet 3  . torsemide (DEMADEX) 10 MG tablet Take 10 mg by mouth daily.    Marland Kitchen atorvastatin (LIPITOR) 40 MG tablet Take 1 tablet (40 mg total) by mouth daily at 6 PM. 90 tablet 3  . carvedilol (COREG) 3.125 MG tablet Take 1 tablet (3.125 mg total) by mouth 2 (two) times daily. 180 tablet 3  . hydrALAZINE (APRESOLINE) 10 MG tablet Take 1 tablet (10 mg total) by mouth 2 (two) times daily. 180 tablet 3  . isosorbide dinitrate (ISORDIL) 10 MG tablet Take 1 tablet (10 mg total) by mouth 2 (two) times daily. 180 tablet 3   No facility-administered medications prior to visit.    PAST MEDICAL HISTORY: Past Medical History:  Diagnosis Date  . Anemia   . Anxiety   . Bipolar 1 disorder ([place])   . Common migraine 05/19/2015  . Depression   . Diabetes mellitus, type II ([place])   . Hypertension   . Irritable bowel syndrome (IBS)   . Mild mental retardation   . Obesity   . Partial complex seizure disorder with intractable epilepsy ([place]) 05/12/2014  . Seizures ([place])    intractable, sz 08/23/17  . Sleep apnea   . Stroke ([place])   . Type II or unspecified type diabetes mellitus without mention of complication, not stated as uncontrolled     PAST SURGICAL HISTORY: Past Surgical History:  Procedure Laterality Date  . BUBBLE STUDY  11/15/2019   Procedure: BUBBLE STUDY;  Surgeon: Dorothy Spark, MD;  Location: [facility];  Service: Cardiovascular;;  . COLONOSCOPY     2012-normal , Dr Sharlett Iles  . ESOPHAGOGASTRODUODENOSCOPY     normal-Dr Patterson 2012  . LOOP RECORDER INSERTION N/A 05/30/2017   Procedure: Loop Recorder Insertion;  Surgeon: Constance Haw, MD;  Location: [facility];  Service: Cardiovascular;  Laterality: N/A;  . LOOP RECORDER REMOVAL N/A 03/04/2018   Procedure: LOOP RECORDER REMOVAL;  Surgeon:  Constance Haw, MD;  Location: [facility];  Service: Cardiovascular;  Laterality: N/A;  . MYRINGOTOMY WITH TUBE PLACEMENT    . MYRINGOTOMY WITH TUBE PLACEMENT Right 11/05/2017   Procedure: MYRINGOTOMY WITH TUBE PLACEMENT;  Surgeon: Melissa Montane, MD;  Location: [facility];  Service: ENT;  Laterality: Right;  right T Tube placement  . NASAL SINUS SURGERY    . TEE WITHOUT CARDIOVERSION N/A 05/30/2017   Procedure: TRANSESOPHAGEAL ECHOCARDIOGRAM (TEE);  Surgeon: Acie Fredrickson Wonda Cheng, MD;  Location: [facility];  Service: Cardiovascular;  Laterality: N/A;  . TEE WITHOUT CARDIOVERSION N/A 11/15/2019   Procedure: TRANSESOPHAGEAL ECHOCARDIOGRAM (TEE);  Surgeon: Dorothy Spark,  MD;  Location: [facility];  Service: Cardiovascular;  Laterality: N/A;    FAMILY HISTORY: Family History  Problem Relation Age of Onset  . Diabetes Mother        passed away from accidental death  . Hypertension Mother   . Bipolar disorder Father   . Diabetes Daughter   . Leukemia Daughter   . Ovarian cancer Maternal Aunt     SOCIAL HISTORY: Social History   Socioeconomic History  . Marital status: Single    Spouse name: [name]  . Number of children: 3  . Years of education: 24  . Highest education level: Not on file  Occupational History  . Occupation: disabled    Employer: DISABLED  Tobacco Use  . Smoking status: Never Smoker  . Smokeless tobacco: Never Used  Substance and Sexual Activity  . Alcohol use: No  . Drug use: No  . Sexual activity: Never  Other Topics Concern  . Not on file  Social History Narrative   Patient lives at home with daughter.    Patient has 3 children.    Patient is right handed.    Patient has a high school education.    Patient is on disability   Patient drinks 2 cups of caffeine daily.   Social Determinants of Health   Financial Resource Strain:   . Difficulty of Paying Living Expenses:   Food Insecurity: No Food Insecurity  . Worried About Sales executive in the Last Year: Never true  . Ran Out of Food in the Last Year: Never true  Transportation Needs: No Transportation Needs  . Lack of Transportation (Medical): No  . Lack of Transportation (Non-Medical): No  Physical Activity:   . Days of Exercise per Week:   . Minutes of Exercise per Session:   Stress:   . Feeling of Stress :   Social Connections:   . Frequency of Communication with Friends and Family:   . Frequency of Social Gatherings with Friends and Family:   . Attends Religious Services:   . Active Member of Clubs or Organizations:   . Attends Archivist Meetings:   Marland Kitchen Marital Status:   Intimate Partner Violence:   . Fear of Current or Ex-Partner:   . Emotionally Abused:   Marland Kitchen Physically Abused:   . Sexually Abused:       PHYSICAL EXAM  Vitals:   05/11/20 1431  BP: 132/82  Pulse: 100  Weight: 296 lb (134.3 kg)  Height: 5\' 1"  (1.549 m)   Body mass index is 55.93 kg/m.  Generalized: Well developed, in no acute distress  Cardiology: normal rate and rhythm, no murmur noted Respiratory: clear to auscultation bilaterally  Neck cir 16.25", Mallampati 2+ Neurological examination  Mentation: Alert oriented to time, place, history taking. Follows all commands speech and language fluent Cranial nerve II-XII: Pupils were equal round reactive to light. Extraocular movements were full, visual field were full . Motor: The motor testing reveals 5 over 5 strength of all 4 extremities. Good symmetric motor tone is noted throughout.  Gait and station: Gait is normal.   DIAGNOSTIC DATA (LABS, IMAGING, TESTING) - I reviewed patient records, labs, notes, testing and imaging myself where available.  No flowsheet data found.   Lab Results  Component Value Date   WBC 6.7 04/18/2020   HGB 9.4 (L) 04/18/2020   HCT 30.1 (L) 04/18/2020   MCV 86 04/18/2020   PLT 311 04/18/2020      Component Value Date/Time  NA 138 04/18/2020 1142   K 4.3 04/18/2020 1142   CL  103 04/18/2020 1142   CO2 23 04/18/2020 1142   GLUCOSE 160 (H) 04/18/2020 1142   GLUCOSE 129 (H) 10/18/2019 0638   BUN 14 04/18/2020 1142   CREATININE 1.23 (H) 04/18/2020 1142   CREATININE 0.72 01/30/2015 1241   CALCIUM 8.7 04/18/2020 1142   PROT 6.4 04/18/2020 1142   ALBUMIN 3.5 (L) 04/18/2020 1142   AST 10 04/18/2020 1142   ALT 10 04/18/2020 1142   ALKPHOS 115 04/18/2020 1142   BILITOT 0.3 04/18/2020 1142   GFRNONAA 51 (L) 04/18/2020 1142   GFRNONAA >89 01/30/2015 1241   GFRAA 59 (L) 04/18/2020 1142   GFRAA >89 01/30/2015 1241   Lab Results  Component Value Date   CHOL 108 04/18/2020   HDL 52 04/18/2020   LDLCALC 44 04/18/2020   TRIG 52 04/18/2020   CHOLHDL 2.1 04/18/2020   Lab Results  Component Value Date   HGBA1C 7.3 (H) 04/18/2020   Lab Results  Component Value Date   VITAMINB12 379 05/13/2014   Lab Results  Component Value Date   TSH 1.220 02/10/2018       ASSESSMENT AND PLAN 49 y.o. year old female  has a past medical history of Anemia, Anxiety, Bipolar 1 disorder ([place]), Common migraine (05/19/2015), Depression, Diabetes mellitus, type II ([place]), Hypertension, Irritable bowel syndrome (IBS), Mild mental retardation, Obesity, Partial complex seizure disorder with intractable epilepsy ([place]) (05/12/2014), Seizures ([place]), Sleep apnea, Stroke ([place]), and Type II or unspecified type diabetes mellitus without mention of complication, not stated as uncontrolled. here with     ICD-10-CM   1. OSA on CPAP  G47.33 For home use only DME continuous positive airway pressure (CPAP)   Z99.89 CANCELED: For home use only DME continuous positive airway pressure (CPAP)  2. Seizure disorder ([place])  G40.909     Brandi Gomez reports that she uses CPAP every night.  Compliance report reveals excellent compliance.  We have discussed concerns of very large leak.  I am not certain if machine malfunction may be contributing.  I have reviewed her previous note that showed a leak in the 95th  percentile around 38 L/min.  Due to concerns with her current CPAP machine as well as the age of motion, I have ordered a new CPAP machine with supplies.  I am hopeful that this may help with controlling the excessive leak.  She was encouraged to call adapt her options of getting a loaner machine until we are able to process her new machine.  We have provided her with a telephone number today in the office.  She agrees to call them today.  She was encouraged to continue seizure medications as advised by Dr. Jannifer Franklin.  Healthy lifestyle habits encouraged.  She will follow-up with [place] in 31 to 90 days following new CPAP initiation.  She verbalizes understanding and agreement with this plan.   Orders Placed This Encounter  Procedures  . For home use only DME continuous positive airway pressure (CPAP)    Patient needs new CPAP machine and supplies. AutoPAP with min pressure 7cmH20, max pressure 12cmH20 and EPR 1.    Order Specific Question:   Length of Need    Answer:   Lifetime    Order Specific Question:   Patient has OSA or probable OSA    Answer:   Yes    Order Specific Question:   Is the patient currently using CPAP in the home  Answer:   Yes    Order Specific Question:   Settings    Answer:   Other see comments    Order Specific Question:   CPAP supplies needed    Answer:   Mask, headgear, cushions, filters, heated tubing and water chamber     No orders of the defined types were placed in this encounter.     I spent 15 minutes with the patient. 50% of this time was spent counseling and educating patient on plan of care and medications.    Debbora Presto, FNP-C 05/11/2020, 3:10 PM [facility] [address] [place], [place] [address] [phone]  I reviewed the above note and documentation by the Nurse Practitioner and agree with the history, exam, assessment and plan as outlined above. I was available for consultation. Star Age, MD, PhD [facility])

## 2020-05-12 ENCOUNTER — Other Ambulatory Visit: Payer: Self-pay

## 2020-05-12 ENCOUNTER — Ambulatory Visit (HOSPITAL_COMMUNITY): Payer: Medicare HMO | Admitting: Psychiatry

## 2020-05-12 NOTE — Patient Outreach (Signed)
[facility]) Care Management  [place]  05/12/2020   Brandi Gomez [birth year] [id number]  Subjective: Telephone call to patient for disease management follow up. Patient reports she is having some arthritis pain and using tylenol as needed.  Discussed pain control and notifying physician for uncontrolled pain.  She verbalized understanding. Patient unable to report her blood pressure but reports that when she has gone to the doctor recently her blood pressure has been good.  Discussed with patient importance of monitoring blood pressure and importance of blood pressure control. She verbalized understanding.     Objective:   Encounter Medications:  Outpatient Encounter Medications as of 05/12/2020  Medication Sig Note  . acetaminophen (TYLENOL) 325 MG tablet Take 2 tablets (650 mg total) by mouth every 4 (four) hours as needed for mild pain (or temp > 37.5 C (99.5 F)). 04/18/2020: Per patient taking  . ARIPiprazole (ABILIFY) 10 MG tablet Take 1 tablet (10 mg total) by mouth daily.   Marland Kitchen atorvastatin (LIPITOR) 40 MG tablet Take 1 tablet (40 mg total) by mouth daily at 6 PM.   . carvedilol (COREG) 3.125 MG tablet Take 1 tablet (3.125 mg total) by mouth 2 (two) times daily. 01/05/2020: Pt could not remember if she took this  . glipiZIDE (GLUCOTROL) 5 MG tablet Take 1 tablet (5 mg total) by mouth 2 (two) times daily with a meal.   . hydrALAZINE (APRESOLINE) 10 MG tablet Take 1 tablet (10 mg total) by mouth 2 (two) times daily. 01/05/2020: Pt could not remember if she took this   . isosorbide dinitrate (ISORDIL) 10 MG tablet Take 1 tablet (10 mg total) by mouth 2 (two) times daily. 01/05/2020: Pt could not remember if she took this   . lamoTRIgine (LAMICTAL) 150 MG tablet Take 1 tablet (150 mg total) by mouth 2 (two) times daily.   Marland Kitchen torsemide (DEMADEX) 10 MG tablet Take 10 mg by mouth daily.   . [DISCONTINUED] methylphenidate (RITALIN) 5 MG tablet Take 1 tablet (5 mg total)  by mouth 2 (two) times daily. (Patient not taking: Reported on 12/29/2019) 11/08/2019: Hasnt started  . [DISCONTINUED] traZODone (DESYREL) 100 MG tablet Take 1 tablet (100 mg total) by mouth at bedtime. (Patient not taking: Reported on 12/29/2019)    No facility-administered encounter medications on file as of 05/12/2020.    Functional Status:  In your present state of health, do you have any difficulty performing the following activities: 04/14/2020 10/15/2019  Hearing? N N  Vision? N N  Difficulty concentrating or making decisions? N Y  Walking or climbing stairs? N Y  Dressing or bathing? N Y  Doing errands, shopping? Tempie Donning  Comment patient needs assistance -  Conservation officer, nature and eating ? N -  Using the Toilet? N -  Comment - -  In the past six months, have you accidently leaked urine? N -  Do you have problems with loss of bowel control? N -  Managing your Medications? Y -  Comment Friend gives medication daily -  Managing your Finances? N -  Housekeeping or managing your Housekeeping? N -  Some encounter information is confidential and restricted. Go to Review Flowsheets activity to see all data.  Some recent data might be hidden    Fall/Depression Screening: Fall Risk  04/18/2020 04/14/2020 01/04/2020  Falls in the past year? 0 0 0  Number falls in past yr: - - 0  Injury with Fall? - - 0  Comment - - -  Follow up Falls evaluation completed - Falls evaluation completed   PHQ 2/9 Scores 04/18/2020 04/14/2020 01/04/2020 09/01/2019 08/09/2019 06/21/2019 05/12/2019  PHQ - 2 Score 1 1 0 0 0 0 0    Assessment: Patient continues to manage chronic conditions. Patient benefits from disease management support.    Plan:  [facility] CM Care Plan Problem One     Most Recent Value  Care Plan Problem One  Deficient knowledge related to Hypertension  Role Documenting the Problem One  Care Management Telephonic Coordinator  Care Plan for Problem One  Active  [facility] Long Term Goal   Pt will verbalized two symptoms  of elevated BP within 90 days  [facility] Long Term Goal Start Date  04/14/20  Interventions for Problem One Long Term Goal  Patient able to describe signs of headache and dizziness when her blood pressure has been up.  RN CM added other symptoms of possible chest pain, fatigue and irregular heartbeat as signs of increased blood pressure.    [facility] CM Short Term Goal #1   Patient will monitor blood pressure at least weekly with 30 days.  [facility] CM Short Term Goal #1 Start Date  04/14/20  Interventions for Short Term Goal #1  Patient not checking blood pressure.  she repots she has a new one on order.  Reiterated the importance of monitoring blood pressure.       RN CM will contact patient in the month of September and patient agreeable.    [name], RN, MSN [place] Management Care Management Coordinator Direct Line [phone] Cell [phone] Toll Free: [phone]  Fax: [phone]

## 2020-05-22 DIAGNOSIS — R569 Unspecified convulsions: Secondary | ICD-10-CM | POA: Diagnosis not present

## 2020-05-22 DIAGNOSIS — I129 Hypertensive chronic kidney disease with stage 1 through stage 4 chronic kidney disease, or unspecified chronic kidney disease: Secondary | ICD-10-CM | POA: Diagnosis not present

## 2020-05-22 DIAGNOSIS — N183 Chronic kidney disease, stage 3 unspecified: Secondary | ICD-10-CM | POA: Insufficient documentation

## 2020-05-22 DIAGNOSIS — Z7984 Long term (current) use of oral hypoglycemic drugs: Secondary | ICD-10-CM | POA: Diagnosis not present

## 2020-05-22 DIAGNOSIS — G40909 Epilepsy, unspecified, not intractable, without status epilepticus: Secondary | ICD-10-CM | POA: Diagnosis not present

## 2020-05-22 DIAGNOSIS — E1122 Type 2 diabetes mellitus with diabetic chronic kidney disease: Secondary | ICD-10-CM | POA: Diagnosis not present

## 2020-05-22 DIAGNOSIS — R410 Disorientation, unspecified: Secondary | ICD-10-CM | POA: Diagnosis not present

## 2020-05-23 ENCOUNTER — Encounter (HOSPITAL_COMMUNITY): Payer: Self-pay | Admitting: Emergency Medicine

## 2020-05-23 ENCOUNTER — Emergency Department (HOSPITAL_COMMUNITY)
Admission: EM | Admit: 2020-05-23 | Discharge: 2020-05-23 | Disposition: A | Discharge status: Home / Self Care | Payer: Medicare HMO | Attending: Emergency Medicine | Admitting: Emergency Medicine

## 2020-05-23 DIAGNOSIS — R569 Unspecified convulsions: Secondary | ICD-10-CM

## 2020-05-23 LAB — BASIC METABOLIC PANEL
Anion gap: 8 (ref 5–15)
BUN: 23 mg/dL — ABNORMAL HIGH (ref 6–20)
CO2: 26 mmol/L (ref 22–32)
Calcium: 8.3 mg/dL — ABNORMAL LOW (ref 8.9–10.3)
Chloride: 104 mmol/L (ref 98–111)
Creatinine, Ser: 1.27 mg/dL — ABNORMAL HIGH (ref 0.44–1.00)
GFR calc Af Amer: 57 mL/min — ABNORMAL LOW (ref >60)
GFR calc non Af Amer: 49 mL/min — ABNORMAL LOW (ref >60)
Glucose, Bld: 138 mg/dL — ABNORMAL HIGH (ref 70–99)
Potassium: 4.8 mmol/L (ref 3.5–5.1)
Sodium: 138 mmol/L (ref 135–145)

## 2020-05-23 LAB — CBC WITH DIFFERENTIAL/PLATELET
Abs Immature Granulocytes: 0.04 10*3/uL (ref 0.00–0.07)
Basophils Absolute: 0 10*3/uL (ref 0.0–0.1)
Basophils Relative: 0 %
Eosinophils Absolute: 0.3 10*3/uL (ref 0.0–0.5)
Eosinophils Relative: 3 %
HCT: 32.1 % — ABNORMAL LOW (ref 36.0–46.0)
Hemoglobin: 9.6 g/dL — ABNORMAL LOW (ref 12.0–15.0)
Immature Granulocytes: 1 %
Lymphocytes Relative: 36 %
Lymphs Abs: 3.1 10*3/uL (ref 0.7–4.0)
MCH: 26.8 pg (ref 26.0–34.0)
MCHC: 29.9 g/dL — ABNORMAL LOW (ref 30.0–36.0)
MCV: 89.7 fL (ref 80.0–100.0)
Monocytes Absolute: 0.5 10*3/uL (ref 0.1–1.0)
Monocytes Relative: 6 %
Neutro Abs: 4.5 10*3/uL (ref 1.7–7.7)
Neutrophils Relative %: 54 %
Platelets: 305 10*3/uL (ref 150–400)
RBC: 3.58 MIL/uL — ABNORMAL LOW (ref 3.87–5.11)
RDW: 15.7 % — ABNORMAL HIGH (ref 11.5–15.5)
WBC: 8.5 10*3/uL (ref 4.0–10.5)
nRBC: 0 % (ref 0.0–0.2)

## 2020-05-23 LAB — CBG MONITORING, ED: Glucose-Capillary: 129 mg/dL — ABNORMAL HIGH (ref 70–99)

## 2020-05-23 LAB — URINALYSIS, ROUTINE W REFLEX MICROSCOPIC
Bilirubin Urine: NEGATIVE
Glucose, UA: NEGATIVE mg/dL
Ketones, ur: NEGATIVE mg/dL
Leukocytes,Ua: NEGATIVE
Nitrite: NEGATIVE
Protein, ur: 30 mg/dL — AB
Specific Gravity, Urine: 1.024 (ref 1.005–1.030)
pH: 5 (ref 5.0–8.0)

## 2020-05-23 LAB — URINALYSIS, MICROSCOPIC (REFLEX)

## 2020-05-23 NOTE — Discharge Instructions (Signed)
Follow-up with neurology.  Please a call to them to let them know that you had an additional seizure.  If you have any new worsening symptoms please seek reevaluation.

## 2020-05-23 NOTE — ED Triage Notes (Signed)
Pt arriving via [facility] from home due to seizure activity. Pts husband witnessed pt having seizure that lasted for a few seconds. Pt has history of seizures and reports taking her medications as ordered. Pt A&O x4 and ambulatory upon arrival.

## 2020-05-23 NOTE — ED Provider Notes (Signed)
[facility] DEPT Provider Note   CSN: [id number] Arrival date & time: 05/22/20  2355    History Chief Complaint  Patient presents with  . Seizures   Brandi Gomez is a 49 y.o. female with past medical history significant for anxiety, bipolar, diabetes, complex intractable epilepsy, CVA who presents for evaluation of seizure.  Patient states she has had breakthrough seizures in the past despite compliance with Lamictal.  Was supposed to follow with week surgery for possible surgical intervention.  Was last seen by [facility] neurology 2 weeks ago.  They did not think she needed medication change.  No recent falls or injury.  She denies hitting her head.  No tongue biting, urinary incontinence.  Seizure witnessed by significant other.  Lasted a few seconds.  Brief postictal period however returned to baseline with EMS arrival.  No fever, chills, nausea, vomiting, chest pain, shortness of breath, headache, lightheadedness, dizziness, blurred vision, neck pain, abdominal pain, diarrhea, dysuria.  Patient's only complaint is that she is hungry.  Denies additional aggravating or alleviating factors.  History obtained from patient and past medical records. No interpretor was used.  HPI     Past Medical History:  Diagnosis Date  . Anemia   . Anxiety   . Bipolar 1 disorder ([place])   . Common migraine 05/19/2015  . Depression   . Diabetes mellitus, type II ([place])   . Hypertension   . Irritable bowel syndrome (IBS)   . Mild mental retardation   . Obesity   . Partial complex seizure disorder with intractable epilepsy ([place]) 05/12/2014  . Seizures ([place])    intractable, sz 08/23/17  . Sleep apnea   . Stroke ([place])   . Type II or unspecified type diabetes mellitus without mention of complication, not stated as uncontrolled     Patient Active Problem List   Diagnosis Date Noted  . Morbid (severe) obesity due to excess calories ([place]) 04/18/2020  . HLD  (hyperlipidemia) 10/12/2019  . CKD (chronic kidney disease) stage 3, GFR 30-59 ml/min 10/12/2019  . Chronic combined systolic and diastolic heart failure ([place])   . Depression 07/14/2019  . Bipolar disorder ([place]) 05/13/2019  . Bipolar 1 disorder ([place]) 05/13/2019  . Adjustment disorder with anxiety   . History of recent stroke 08/06/2017  . OSA (obstructive sleep apnea)   . Controlled type 2 diabetes mellitus without complication, without long-term current use of insulin ([place]) 05/08/2017  . Essential hypertension 07/27/2008  . Seizure disorder ([place]) 02/05/2007    Past Surgical History:  Procedure Laterality Date  . BUBBLE STUDY  11/15/2019   Procedure: BUBBLE STUDY;  Surgeon: Dorothy Spark, MD;  Location: [facility];  Service: Cardiovascular;;  . COLONOSCOPY     2012-normal , Dr Sharlett Iles  . ESOPHAGOGASTRODUODENOSCOPY     normal-Dr Patterson 2012  . LOOP RECORDER INSERTION N/A 05/30/2017   Procedure: Loop Recorder Insertion;  Surgeon: Constance Haw, MD;  Location: [facility] CV LAB;  Service: Cardiovascular;  Laterality: N/A;  . LOOP RECORDER REMOVAL N/A 03/04/2018   Procedure: LOOP RECORDER REMOVAL;  Surgeon: Constance Haw, MD;  Location: [facility];  Service: Cardiovascular;  Laterality: N/A;  . MYRINGOTOMY WITH TUBE PLACEMENT    . MYRINGOTOMY WITH TUBE PLACEMENT Right 11/05/2017   Procedure: MYRINGOTOMY WITH TUBE PLACEMENT;  Surgeon: Melissa Montane, MD;  Location: [facility];  Service: ENT;  Laterality: Right;  right T Tube placement  . NASAL SINUS SURGERY    . TEE WITHOUT  CARDIOVERSION N/A 05/30/2017   Procedure: TRANSESOPHAGEAL ECHOCARDIOGRAM (TEE);  Surgeon: Acie Fredrickson Wonda Cheng, MD;  Location: [facility];  Service: Cardiovascular;  Laterality: N/A;  . TEE WITHOUT CARDIOVERSION N/A 11/15/2019   Procedure: TRANSESOPHAGEAL ECHOCARDIOGRAM (TEE);  Surgeon: Dorothy Spark, MD;  Location: [facility] ENDOSCOPY;  Service: Cardiovascular;  Laterality: N/A;     OB History     No obstetric history on file.     Family History  Problem Relation Age of Onset  . Diabetes Mother        passed away from accidental death  . Hypertension Mother   . Bipolar disorder Father   . Diabetes Daughter   . Leukemia Daughter   . Ovarian cancer Maternal Aunt     Social History   Tobacco Use  . Smoking status: Never Smoker  . Smokeless tobacco: Never Used  Vaping Use  . Vaping Use: Never used  Substance Use Topics  . Alcohol use: No  . Drug use: No    Home Medications Prior to Admission medications   Medication Sig Start Date End Date Taking? Authorizing Provider  acetaminophen (TYLENOL) 325 MG tablet Take 2 tablets (650 mg total) by mouth every 4 (four) hours as needed for mild pain (or temp > 37.5 C (99.5 F)). 10/19/19   Angiulli, [place] Paganini, PA-C  ARIPiprazole (ABILIFY) 10 MG tablet Take 1 tablet (10 mg total) by mouth daily. 03/31/20 03/31/21  Plovsky, Berneta Sages, MD  atorvastatin (LIPITOR) 40 MG tablet Take 1 tablet (40 mg total) by mouth daily at 6 PM. 01/04/20 05/02/20  Sagardia, Ines Bloomer, MD  carvedilol (COREG) 3.125 MG tablet Take 1 tablet (3.125 mg total) by mouth 2 (two) times daily. 01/04/20 05/02/20  Horald Pollen, MD  glipiZIDE (GLUCOTROL) 5 MG tablet Take 1 tablet (5 mg total) by mouth 2 (two) times daily with a meal. 04/19/20 07/18/20  Sagardia, Ines Bloomer, MD  hydrALAZINE (APRESOLINE) 10 MG tablet Take 1 tablet (10 mg total) by mouth 2 (two) times daily. 01/04/20 05/02/20  Horald Pollen, MD  isosorbide dinitrate (ISORDIL) 10 MG tablet Take 1 tablet (10 mg total) by mouth 2 (two) times daily. 01/04/20 05/02/20  Horald Pollen, MD  lamoTRIgine (LAMICTAL) 150 MG tablet Take 1 tablet (150 mg total) by mouth 2 (two) times daily. 05/02/20   [name], MD  torsemide (DEMADEX) 10 MG tablet Take 10 mg by mouth daily.    [provider]  methylphenidate (RITALIN) 5 MG tablet Take 1 tablet (5 mg total) by mouth 2 (two) times  daily. Patient not taking: Reported on 12/29/2019 10/29/19 01/01/20  Izora Ribas, MD  traZODone (DESYREL) 100 MG tablet Take 1 tablet (100 mg total) by mouth at bedtime. Patient not taking: Reported on 12/29/2019 10/19/19 01/01/20  Angiulli, [place] Paganini, PA-C    Allergies    Amoxicillin, Lisinopril, Hydrocodone, and Tegretol [carbamazepine]  Review of Systems   Review of Systems  Constitutional: Negative.   HENT: Negative.   Respiratory: Negative.   Cardiovascular: Negative.   Gastrointestinal: Negative.   Genitourinary: Negative.   Musculoskeletal: Negative.   Neurological: Positive for seizures. Negative for dizziness, tremors, syncope, facial asymmetry, speech difficulty, weakness, light-headedness, numbness and headaches.  All other systems reviewed and are negative.  Physical Exam Updated Vital Signs BP 138/90   Pulse 73   Temp 98 F (36.7 C) (Oral)   Resp 20   Ht 5\' 1"  (1.549 m)   Wt 135.2 kg   LMP 05/16/2020   SpO2  100%   BMI 56.31 kg/m   Physical Exam Physical Exam  Constitutional: Pt is oriented to person, place, and time. Pt appears well-developed and well-nourished. No distress.  HENT:  Head: Normocephalic and atraumatic.  Mouth/Throat: Oropharynx is clear and moist.  Eyes: Conjunctivae and EOM are normal. Pupils are equal, round, and reactive to light. No scleral icterus.  No horizontal, vertical or rotational nystagmus  Neck: Normal range of motion. Neck supple.  Full active and passive ROM without pain No midline or paraspinal tenderness No nuchal rigidity or meningeal signs  Cardiovascular: Normal rate, regular rhythm and intact distal pulses.   Pulmonary/Chest: Effort normal and breath sounds normal. No respiratory distress. Pt has no wheezes. No rales.  Abdominal: Soft. Bowel sounds are normal. There is no tenderness. There is no rebound and no guarding.  Musculoskeletal: Normal range of motion.  Lymphadenopathy:    No cervical adenopathy.   Neurological: Pt. is alert and oriented to person, place, and time. He has normal reflexes. No cranial nerve deficit.  Exhibits normal muscle tone. Coordination normal.  Mental Status:  Alert, oriented, thought content appropriate. Speech fluent without evidence of aphasia. Able to follow 2 step commands without difficulty.  Cranial Nerves:  II:  Peripheral visual fields grossly normal, pupils equal, round, reactive to light III,IV, VI: ptosis not present, extra-ocular motions intact bilaterally  V,VII: smile symmetric, facial light touch sensation equal VIII: hearing grossly normal bilaterally  IX,X: midline uvula rise  XI: bilateral shoulder shrug equal and strong XII: midline tongue extension  Motor:  5/5 in upper and lower extremities bilaterally including strong and equal grip strength and dorsiflexion/plantar flexion Sensory: Pinprick and light touch normal in all extremities.  Deep Tendon Reflexes: 2+ and symmetric  Cerebellar: normal finger-to-nose with bilateral upper extremities Gait: normal gait and balance CV: distal pulses palpable throughout   Skin: Skin is warm and dry. No rash noted. Pt is not diaphoretic.  Psychiatric: Pt has a normal mood and affect. Behavior is normal. Judgment and thought content normal.  Nursing note and vitals reviewed. ED Results / Procedures / Treatments   Labs (all labs ordered are listed, but only abnormal results are displayed) Labs Reviewed  CBC WITH DIFFERENTIAL/PLATELET - Abnormal; Notable for the following components:      Result Value   RBC 3.58 (*)    Hemoglobin 9.6 (*)    HCT 32.1 (*)    MCHC 29.9 (*)    RDW 15.7 (*)    All other components within normal limits  BASIC METABOLIC PANEL - Abnormal; Notable for the following components:   Glucose, Bld 138 (*)    BUN 23 (*)    Creatinine, Ser 1.27 (*)    Calcium 8.3 (*)    GFR calc non Af Amer 49 (*)    GFR calc Af Amer 57 (*)    All other components within normal limits   URINALYSIS, ROUTINE W REFLEX MICROSCOPIC - Abnormal; Notable for the following components:   Hgb urine dipstick MODERATE (*)    Protein, ur 30 (*)    All other components within normal limits  URINALYSIS, MICROSCOPIC (REFLEX) - Abnormal; Notable for the following components:   Bacteria, UA RARE (*)    All other components within normal limits  CBG MONITORING, ED - Abnormal; Notable for the following components:   Glucose-Capillary 129 (*)    All other components within normal limits  LAMOTRIGINE LEVEL    EKG None  Radiology No results found.  Procedures Procedures (  including critical care time)  Medications Ordered in ED Medications - No data to display  ED Course  I have reviewed the triage vital signs and the nursing notes.  Pertinent labs & imaging results that were available during my care of the patient were reviewed by me and considered in my medical decision making (see chart for details).  49 year old presents for evaluation of seizure.  Known history of epilepsy followed by [facility] neurology.  She is afebrile, nonseptic, not ill-appearing.  Has been compliant with her Lamictal.  Has had breakthrough seizures in the past.  Brief bilateral twitching by significant other which he states is similar to prior seizures.  No tongue biting or urinary incontinence.  No recent trauma.  Appears overall well.  She was back to her baseline mentation and ambulatory prior to EMS arrival.  Nonfocal neuro exam without deficits.  Will obtain some basic labs monitor, likely DC home if no significant abnormality found with close follow-up with neurology.  Labs and imaging personally reviewed and interpreted: CBC without leukocytosis, hemoglobin at baseline Metabolic panel with hyperglycemia to 138, creatinine 1.27, GFR 58, kidney function at baseline Urinalysis negative for infection  Patient reassessed.  Has been monitored for 3.5 hours without seziure activity. Will have her follow up  with Neurology outpatient. Tolerating PO intake and ambulating without difficulty.  The patient has been appropriately medically screened and/or stabilized in the ED. I have low suspicion for any other emergent medical condition which would require further screening, evaluation or treatment in the ED or require inpatient management.  Patient is hemodynamically stable and in no acute distress.  Patient able to ambulate in department prior to ED.  Evaluation does not show acute pathology that would require ongoing or additional emergent interventions while in the emergency department or further inpatient treatment.  I have discussed the diagnosis with the patient and answered all questions.  Pain is been managed while in the emergency department and patient has no further complaints prior to discharge.  Patient is comfortable with plan discussed in room and is stable for discharge at this time.  I have discussed strict return precautions for returning to the emergency department.  Patient was encouraged to follow-up with PCP/specialist refer to at discharge.   MDM Rules/Calculators/A&P                           Final Clinical Impression(s) / ED Diagnoses Final diagnoses:  Seizure [facility])    Rx / DC Orders ED Discharge Orders    None       Sehar Sedano A, PA-C 05/23/20 0319    Ezequiel Essex, MD 05/23/20 [phone]

## 2020-05-24 ENCOUNTER — Other Ambulatory Visit: Payer: Self-pay

## 2020-05-24 ENCOUNTER — Telehealth (INDEPENDENT_AMBULATORY_CARE_PROVIDER_SITE_OTHER): Payer: Medicare HMO | Admitting: Psychiatry

## 2020-05-24 DIAGNOSIS — F0630 Mood disorder due to known physiological condition, unspecified: Secondary | ICD-10-CM

## 2020-05-24 LAB — LAMOTRIGINE LEVEL: Lamotrigine Lvl: 5.4 ug/mL (ref 2.0–20.0)

## 2020-05-24 MED ORDER — ARIPIPRAZOLE 10 MG PO TABS: 10.0000 mg | ORAL_TABLET | Freq: Every day | ORAL | 2 refills | Status: DC

## 2020-05-24 NOTE — Progress Notes (Signed)
Psychiatric Initial Adult Assessment   Patient Identification: Brandi Gomez MRN:  [id number] Date of Evaluation:  05/24/2020 Referral Source: Dr. Tory Emerald Chief Complaint:  Suspiciousness/paranoia Visit Diagnosis: Mood disorder secondary to CVA  Today patient seems to be fairly stable.  She is lucid and is she is talking and make sense.  [name] her caregiver is gone fishing.  Patient had a seizure about a week ago and says she still recovering.  Patient has not attempted to leave at this time.  She denies being depressed.  She seems to be sleeping well she just got her CPAP machine.  She drinks no alcohol and uses no drugs.  She watches to very little bit of TV.  She is looking for things to do to keep her busy and active.  Patient has no evidence of psychosis.  Is back on the Abilify 10 mg and seems to be fairly stable.  She is not oversedated. (Hypo) Manic Symptoms:   Anxiety Symptoms:   Psychotic Symptoms:   PTSD Symptoms:   Past Psychiatric History: Cymbalta 30 mg Previous Psychotropic Medications: Cymbalta 30 mg  Substance Abuse History in the last 12 months:  No.  Consequences of Substance Abuse:   Past Medical History:  Past Medical History:  Diagnosis Date  . Anemia   . Anxiety   . Bipolar 1 disorder ([place])   . Common migraine 05/19/2015  . Depression   . Diabetes mellitus, type II ([place])   . Hypertension   . Irritable bowel syndrome (IBS)   . Mild mental retardation   . Obesity   . Partial complex seizure disorder with intractable epilepsy ([place]) 05/12/2014  . Seizures ([place])    intractable, sz 08/23/17  . Sleep apnea   . Stroke ([place])   . Type II or unspecified type diabetes mellitus without mention of complication, not stated as uncontrolled     Past Surgical History:  Procedure Laterality Date  . BUBBLE STUDY  11/15/2019   Procedure: BUBBLE STUDY;  Surgeon: Dorothy Spark, MD;  Location: [facility];  Service: Cardiovascular;;  . COLONOSCOPY     2012-normal  , Dr Sharlett Iles  . ESOPHAGOGASTRODUODENOSCOPY     normal-Dr Patterson 2012  . LOOP RECORDER INSERTION N/A 05/30/2017   Procedure: Loop Recorder Insertion;  Surgeon: Constance Haw, MD;  Location: [facility];  Service: Cardiovascular;  Laterality: N/A;  . LOOP RECORDER REMOVAL N/A 03/04/2018   Procedure: LOOP RECORDER REMOVAL;  Surgeon: Constance Haw, MD;  Location: [place] [facility];  Service: Cardiovascular;  Laterality: N/A;  . MYRINGOTOMY WITH TUBE PLACEMENT    . MYRINGOTOMY WITH TUBE PLACEMENT Right 11/05/2017   Procedure: MYRINGOTOMY WITH TUBE PLACEMENT;  Surgeon: Melissa Montane, MD;  Location: [place];  Service: ENT;  Laterality: Right;  right T Tube placement  . NASAL SINUS SURGERY    . TEE WITHOUT CARDIOVERSION N/A 05/30/2017   Procedure: TRANSESOPHAGEAL ECHOCARDIOGRAM (TEE);  Surgeon: Acie Fredrickson Wonda Cheng, MD;  Location: [place];  Service: Cardiovascular;  Laterality: N/A;  . TEE WITHOUT CARDIOVERSION N/A 11/15/2019   Procedure: TRANSESOPHAGEAL ECHOCARDIOGRAM (TEE);  Surgeon: Dorothy Spark, MD;  [facility];  Service: Cardiovascular;  Laterality: N/A;    Family Psychiatric History:   Family History:  Family History  Problem Relation Age of Onset  . Diabetes Mother        passed away from accidental death  . Hypertension Mother   . Bipolar disorder Father   . Diabetes Daughter   .  Leukemia Daughter   . Ovarian cancer Maternal Aunt     Social History:   Social History   Socioeconomic History  . Marital status: Single    Spouse name: [name]  . Number of children: 3  . Years of education: 62  . Highest education level: Not on file  Occupational History  . Occupation: disabled    Employer: DISABLED  Tobacco Use  . Smoking status: Never Smoker  . Smokeless tobacco: Never Used  Vaping Use  . Vaping Use: Never used  Substance and Sexual Activity  . Alcohol use: No  . Drug use: No  . Sexual activity: Never  Other Topics Concern  . Not  on file  Social History Narrative   Patient lives at home with daughter.    Patient has 3 children.    Patient is right handed.    Patient has a high school education.    Patient is on disability   Patient drinks 2 cups of caffeine daily.   Social Determinants of Health   Financial Resource Strain:   . Difficulty of Paying Living Expenses:   Food Insecurity: No Food Insecurity  . Worried About Charity fundraiser in the Last Year: Never true  . Ran Out of Food in the Last Year: Never true  Transportation Needs: No Transportation Needs  . Lack of Transportation (Medical): No  . Lack of Transportation (Non-Medical): No  Physical Activity:   . Days of Exercise per Week:   . Minutes of Exercise per Session:   Stress:   . Feeling of Stress :   Social Connections:   . Frequency of Communication with Friends and Family:   . Frequency of Social Gatherings with Friends and Family:   . Attends Religious Services:   . Active Member of Clubs or Organizations:   . Attends Archivist Meetings:   Marland Kitchen Marital Status:     Additional Social History:   Allergies:   Allergies  Allergen Reactions  . Amoxicillin Itching    Did it involve swelling of the face/tongue/throat, SOB, or low BP? No Did it involve sudden or severe rash/hives, skin peeling, or any reaction on the inside of your mouth or nose? No Did you need to seek medical attention at a hospital or doctor's office? No When did it last happen?within the past 10 years If all above answers are "NO", may proceed with cephalosporin use.   Marland Kitchen Lisinopril Swelling    Angioedema   . Hydrocodone Other (See Comments)    Depressed   . Tegretol [Carbamazepine] Swelling    Throat swells    Metabolic Disorder Labs: Lab Results  Component Value Date   HGBA1C 7.3 (H) 04/18/2020   MPG 139.85 10/12/2019   MPG 163 11/05/2017   No results found for: PROLACTIN Lab Results  Component Value Date   CHOL 108 04/18/2020   TRIG  52 04/18/2020   HDL 52 04/18/2020   CHOLHDL 2.1 04/18/2020   VLDL 10 10/12/2019   LDLCALC 44 04/18/2020   LDLCALC 64 10/12/2019     Current Medications: Current Outpatient Medications  Medication Sig Dispense Refill  . acetaminophen (TYLENOL) 325 MG tablet Take 2 tablets (650 mg total) by mouth every 4 (four) hours as needed for mild pain (or temp > 37.5 C (99.5 F)).    Marland Kitchen ARIPiprazole (ABILIFY) 10 MG tablet Take 1 tablet (10 mg total) by mouth daily. 30 tablet 2  . atorvastatin (LIPITOR) 40 MG tablet Take 1 tablet (40  mg total) by mouth daily at 6 PM. 90 tablet 3  . carvedilol (COREG) 3.125 MG tablet Take 1 tablet (3.125 mg total) by mouth 2 (two) times daily. 180 tablet 3  . glipiZIDE (GLUCOTROL) 5 MG tablet Take 1 tablet (5 mg total) by mouth 2 (two) times daily with a meal. 180 tablet 1  . hydrALAZINE (APRESOLINE) 10 MG tablet Take 1 tablet (10 mg total) by mouth 2 (two) times daily. 180 tablet 3  . isosorbide dinitrate (ISORDIL) 10 MG tablet Take 1 tablet (10 mg total) by mouth 2 (two) times daily. 180 tablet 3  . lamoTRIgine (LAMICTAL) 150 MG tablet Take 1 tablet (150 mg total) by mouth 2 (two) times daily. 180 tablet 3  . torsemide (DEMADEX) 10 MG tablet Take 10 mg by mouth daily.     No current facility-administered medications for this visit.    Neurologic: Headache: No Seizure: Yes Paresthesias:No  Musculoskeletal: Strength & Muscle Tone: within normal limits Gait & Station: normal Patient leans: Backward and N/A  Psychiatric Specialty Exam: ROS  Last menstrual period 05/16/2020.There is no height or weight on file to calculate BMI.  General Appearance: Casual  Eye Contact:  Fair  Speech:  Slurred  Volume:  Normal  Mood:  Dysphoric  Affect:  Appropriate  Thought Process:  Goal Directed  Orientation:  Full (Time, Place, and Person)  Thought Content:  Logical  Suicidal Thoughts:  No  Homicidal Thoughts:  No  Memory:  Negative  Judgement:  Fair  Insight:   Lacking  Psychomotor Activity:  Decreased  Concentration:    Recall:  [place] of Knowledge:Fair  Language: Fair  Akathisia:  No  Handed:    AIMS (if indicated):    Assets:  Desire for Improvement  ADL's:  Intact  Cognition: WNL  Sleep:      Treatment Plan Summary: 6/16/20214:39 PM   At this time the patient's problem seems to be a delusional disorder.  At this time she is taking Abilify 10 mg.  Efforts to reduce and discontinue it have led to psychotic behavior and attempts to elope.  She is a very good caregiver in [place].  Overall the patient seems to be stable.  She is lucid able to talk able to understand the importance of taking her medicines.  She had a seizure but seems to be tolerating this experience fairly well.  She will return to see me in 3 months.

## 2020-05-30 ENCOUNTER — Telehealth: Payer: Self-pay | Admitting: *Deleted

## 2020-05-30 DIAGNOSIS — M545 Low back pain: Secondary | ICD-10-CM | POA: Diagnosis not present

## 2020-05-30 DIAGNOSIS — R569 Unspecified convulsions: Secondary | ICD-10-CM | POA: Diagnosis not present

## 2020-05-30 DIAGNOSIS — G4733 Obstructive sleep apnea (adult) (pediatric): Secondary | ICD-10-CM | POA: Diagnosis not present

## 2020-05-30 NOTE — Telephone Encounter (Signed)
I called pt per AL/NP request. She was seen for questionable seizure activity on 6/15. Dr Jannifer Franklin advised she increase lamotrigine to 150mg  BID but patient had not increased dose.  Pt stated she was taking the lamotrigine 150mg  po bid as ordered.  She felt like the cpap (not being able to get supplies for a while) made her have seizures.  But she did say she was taking her lamotrigine.

## 2020-06-09 DIAGNOSIS — E119 Type 2 diabetes mellitus without complications: Secondary | ICD-10-CM | POA: Diagnosis not present

## 2020-06-09 LAB — HM DIABETES EYE EXAM

## 2020-06-13 ENCOUNTER — Encounter: Payer: Self-pay | Admitting: *Deleted

## 2020-06-19 ENCOUNTER — Other Ambulatory Visit (HOSPITAL_COMMUNITY): Payer: Self-pay | Admitting: Psychiatry

## 2020-06-29 ENCOUNTER — Encounter (HOSPITAL_COMMUNITY): Payer: Self-pay

## 2020-06-29 ENCOUNTER — Other Ambulatory Visit: Payer: Self-pay

## 2020-06-29 ENCOUNTER — Ambulatory Visit (HOSPITAL_COMMUNITY)
Admission: EM | Admit: 2020-06-29 | Discharge: 2020-06-29 | Disposition: A | Discharge status: Home / Self Care | Payer: Medicare HMO | Attending: Physician Assistant | Admitting: Physician Assistant

## 2020-06-29 DIAGNOSIS — Z3202 Encounter for pregnancy test, result negative: Secondary | ICD-10-CM

## 2020-06-29 DIAGNOSIS — R3915 Urgency of urination: Secondary | ICD-10-CM

## 2020-06-29 DIAGNOSIS — R82998 Other abnormal findings in urine: Secondary | ICD-10-CM | POA: Diagnosis not present

## 2020-06-29 DIAGNOSIS — R350 Frequency of micturition: Secondary | ICD-10-CM | POA: Diagnosis not present

## 2020-06-29 DIAGNOSIS — R569 Unspecified convulsions: Secondary | ICD-10-CM | POA: Diagnosis not present

## 2020-06-29 DIAGNOSIS — G4733 Obstructive sleep apnea (adult) (pediatric): Secondary | ICD-10-CM | POA: Diagnosis not present

## 2020-06-29 DIAGNOSIS — M545 Low back pain: Secondary | ICD-10-CM | POA: Diagnosis not present

## 2020-06-29 LAB — POCT URINALYSIS DIP (DEVICE)
Bilirubin Urine: NEGATIVE
Glucose, UA: NEGATIVE mg/dL
Hgb urine dipstick: NEGATIVE
Ketones, ur: NEGATIVE mg/dL
Leukocytes,Ua: NEGATIVE
Nitrite: NEGATIVE
Protein, ur: NEGATIVE mg/dL
Specific Gravity, Urine: 1.025 (ref 1.005–1.030)
Urobilinogen, UA: 0.2 mg/dL (ref 0.0–1.0)
pH: 5.5 (ref 5.0–8.0)

## 2020-06-29 LAB — POC URINE PREG, ED: Preg Test, Ur: NEGATIVE

## 2020-06-29 NOTE — Discharge Instructions (Addendum)
Your urinalysis was negative today.  We have sent your urine culture and will call if we need to treat you with antibiotics  Take all of your medications as previously prescribed  Follow up with your Primary care  If symptoms worsening over next few days to include, painful urination, nausea, vomiting, fever, return to this clinic

## 2020-06-29 NOTE — ED Triage Notes (Signed)
Patient reports urinary urgency and frequency x1 week. Also reports foul smell in urine.

## 2020-06-29 NOTE — ED Provider Notes (Signed)
[place]    CSN: [id number] Arrival date & time: 06/29/20  1401      History   Chief Complaint Chief Complaint  Patient presents with  . Urinary Frequency  . Urinary Urgency  . foul odor in urine    HPI Brandi Gomez is a 49 y.o. female.   Patient presents for urinary frequency.  She also reports occasional urgency.  She denies painful urination.  She reports symptoms of been going on for about a week.  She reports she thought maybe her water pill was causing it so she stopped taking that.  She reports she urinates around 5 times a day and then getting up at 3 times in the night.  She does report a normal amount of urine when she is urinating.  She has not had any nausea, vomiting, fever or chills.  No back pain.  Adamantly denies any painful urination.  No belly pain.  Denies any shortness of breath or chest pain today.  She does endorse some lower leg swelling since stopping her torsemide.  She also ports occasionally she cannot make it to the restroom prior to urinating.  She reports this is not necessarily a new finding and is had issues with this in the past.  Patient reports she tolerated Keflex well in January.     Past Medical History:  Diagnosis Date  . Anemia   . Anxiety   . Bipolar 1 disorder ([place])   . Common migraine 05/19/2015  . Depression   . Diabetes mellitus, type II ([place])   . Hypertension   . Irritable bowel syndrome (IBS)   . Mild mental retardation   . Obesity   . Partial complex seizure disorder with intractable epilepsy ([place]) 05/12/2014  . Seizures ([place])    intractable, sz 08/23/17  . Sleep apnea   . Stroke ([place])   . Type II or unspecified type diabetes mellitus without mention of complication, not stated as uncontrolled     Patient Active Problem List   Diagnosis Date Noted  . Morbid (severe) obesity due to excess calories ([place]) 04/18/2020  . HLD (hyperlipidemia) 10/12/2019  . CKD (chronic kidney disease) stage 3, GFR 30-59  ml/min 10/12/2019  . Chronic combined systolic and diastolic heart failure ([place])   . Depression 07/14/2019  . Bipolar disorder ([place]) 05/13/2019  . Bipolar 1 disorder ([place]) 05/13/2019  . Adjustment disorder with anxiety   . History of recent stroke 08/06/2017  . OSA (obstructive sleep apnea)   . Controlled type 2 diabetes mellitus without complication, without long-term current use of insulin ([place]) 05/08/2017  . Essential hypertension 07/27/2008  . Seizure disorder ([place]) 02/05/2007    Past Surgical History:  Procedure Laterality Date  . BUBBLE STUDY  11/15/2019   Procedure: BUBBLE STUDY;  Surgeon: Dorothy Spark, MD;  Location: [facility];  Service: Cardiovascular;;  . COLONOSCOPY     2012-normal , Dr Sharlett Iles  . ESOPHAGOGASTRODUODENOSCOPY     normal-Dr Patterson 2012  . LOOP RECORDER INSERTION N/A 05/30/2017   Procedure: Loop Recorder Insertion;  Surgeon: Constance Haw, MD;  Location: [facility];  Service: Cardiovascular;  Laterality: N/A;  . LOOP RECORDER REMOVAL N/A 03/04/2018   Procedure: LOOP RECORDER REMOVAL;  Surgeon: Constance Haw, MD;  Location: [facility];  Service: Cardiovascular;  Laterality: N/A;  . MYRINGOTOMY WITH TUBE PLACEMENT    . MYRINGOTOMY WITH TUBE PLACEMENT Right 11/05/2017   Procedure: MYRINGOTOMY WITH TUBE PLACEMENT;  Surgeon: Janace Hoard,  Jenny Reichmann, MD;  Location: [facility];  Service: ENT;  Laterality: Right;  right T Tube placement  . NASAL SINUS SURGERY    . TEE WITHOUT CARDIOVERSION N/A 05/30/2017   Procedure: TRANSESOPHAGEAL ECHOCARDIOGRAM (TEE);  Surgeon: Acie Fredrickson Wonda Cheng, MD;  Location: [facility];  Service: Cardiovascular;  Laterality: N/A;  . TEE WITHOUT CARDIOVERSION N/A 11/15/2019   Procedure: TRANSESOPHAGEAL ECHOCARDIOGRAM (TEE);  Surgeon: Dorothy Spark, MD;  Location: [facility];  Service: Cardiovascular;  Laterality: N/A;    OB History   No obstetric history on file.      Home Medications    Prior to Admission  medications   Medication Sig Start Date End Date Taking? Authorizing Provider  acetaminophen (TYLENOL) 325 MG tablet Take 2 tablets (650 mg total) by mouth every 4 (four) hours as needed for mild pain (or temp > 37.5 C (99.5 F)). 10/19/19   Angiulli, Lavon Paganini, PA-C  ARIPiprazole (ABILIFY) 10 MG tablet Take 1 tablet (10 mg total) by mouth daily. 05/24/20 05/24/21  Plovsky, Berneta Sages, MD  atorvastatin (LIPITOR) 40 MG tablet Take 1 tablet (40 mg total) by mouth daily at 6 PM. 01/04/20 05/02/20  Sagardia, Ines Bloomer, MD  carvedilol (COREG) 3.125 MG tablet Take 1 tablet (3.125 mg total) by mouth 2 (two) times daily. 01/04/20 05/02/20  Horald Pollen, MD  glipiZIDE (GLUCOTROL) 5 MG tablet Take 1 tablet (5 mg total) by mouth 2 (two) times daily with a meal. 04/19/20 07/18/20  Sagardia, Ines Bloomer, MD  hydrALAZINE (APRESOLINE) 10 MG tablet Take 1 tablet (10 mg total) by mouth 2 (two) times daily. 01/04/20 05/02/20  Horald Pollen, MD  isosorbide dinitrate (ISORDIL) 10 MG tablet Take 1 tablet (10 mg total) by mouth 2 (two) times daily. 01/04/20 05/02/20  Horald Pollen, MD  lamoTRIgine (LAMICTAL) 150 MG tablet Take 1 tablet (150 mg total) by mouth 2 (two) times daily. 05/02/20   [name], MD  torsemide (DEMADEX) 10 MG tablet Take 10 mg by mouth daily.    [provider]  methylphenidate (RITALIN) 5 MG tablet Take 1 tablet (5 mg total) by mouth 2 (two) times daily. Patient not taking: Reported on 12/29/2019 10/29/19 01/01/20  Izora Ribas, MD  traZODone (DESYREL) 100 MG tablet Take 1 tablet (100 mg total) by mouth at bedtime. Patient not taking: Reported on 12/29/2019 10/19/19 01/01/20  Angiulli, Lavon Paganini, PA-C    Family History Family History  Problem Relation Age of Onset  . Diabetes Mother        passed away from accidental death  . Hypertension Mother   . Bipolar disorder Father   . Diabetes Daughter   . Leukemia Daughter   . Ovarian cancer Maternal Aunt     Social  History Social History   Tobacco Use  . Smoking status: Never Smoker  . Smokeless tobacco: Never Used  Vaping Use  . Vaping Use: Never used  Substance Use Topics  . Alcohol use: No  . Drug use: No     Allergies   Amoxicillin, Lisinopril, Hydrocodone, and Tegretol [carbamazepine]   Review of Systems Review of Systems   Physical Exam Triage Vital Signs ED Triage Vitals  Enc Vitals Group     BP 06/29/20 1518 (!) 142/70     Pulse Rate 06/29/20 1518 74     Resp 06/29/20 1518 16     Temp 06/29/20 1518 98.7 F (37.1 C)     Temp src --      SpO2 06/29/20  1518 100 %     Weight --      Height --      Head Circumference --      Peak Flow --      Pain Score 06/29/20 1517 0     Pain Loc --      Pain Edu? --      Excl. in [place]? --    No data found.  Updated Vital Signs BP (!) 142/70   Pulse 74   Temp 98.7 F (37.1 C)   Resp 16   SpO2 100%   Visual Acuity Right Eye Distance:   Left Eye Distance:   Bilateral Distance:    Right Eye Near:   Left Eye Near:    Bilateral Near:     Physical Exam Vitals and nursing note reviewed.  Constitutional:      General: She is not in acute distress.    Appearance: She is well-developed. She is not ill-appearing.  HENT:     Head: Normocephalic and atraumatic.  Eyes:     Conjunctiva/sclera: Conjunctivae normal.  Cardiovascular:     Rate and Rhythm: Normal rate and regular rhythm.     Heart sounds: No murmur heard.   Pulmonary:     Effort: Pulmonary effort is normal. No respiratory distress.     Breath sounds: Normal breath sounds. No wheezing, rhonchi or rales.  Abdominal:     Palpations: Abdomen is soft.     Tenderness: There is no abdominal tenderness. There is no right CVA tenderness or left CVA tenderness.  Musculoskeletal:     Cervical back: Neck supple.     Comments: Bilateral 1+ edema equal to midshin.  No erythema.  No calf tenderness.  Skin:    General: Skin is warm and dry.  Neurological:     Mental Status:  She is alert.      UC Treatments / Results  Labs (all labs ordered are listed, but only abnormal results are displayed) Labs Reviewed  URINE CULTURE  POC URINE PREG, ED  POCT URINALYSIS DIP (DEVICE)    EKG   Radiology No results found.  Procedures Procedures (including critical care time)  Medications Ordered in UC Medications - No data to display  Initial Impression / Assessment and Plan / UC Course  I have reviewed the triage vital signs and the nursing notes.  Pertinent labs & imaging results that were available during my care of the patient were reviewed by me and considered in my medical decision making (see chart for details).     #Urinary frequency #Urinary urgency Patient is a 49 year old with history of seizure disorder, diabetes and hypertension presenting with urinary frequency and urgency.  Urinalysis completely normal.  No glucose or protein.  No leukocytes or nitrites..  Feel likely urgency is related to urge incontinence and this was thought in the past.  Most recently 04/10/2020 she had dysuria but a negative culture.  01/01/2020 she did have a positive E. coli culture with similar presentation, however she had leukocytes on UA at that time.  Given completely normal urinalysis, afebrile with a benign exam, we will defer treatment until culture returns.  Did discuss strict return and follow-up precautions with the patient.  Discussed that she should follow-up with her primary care to discuss her issues with urination and fall things return normal. -Instructed patient to restart her torsemide and discussed this with her primary care. Final Clinical Impressions(s) / UC Diagnoses   Final diagnoses:  Urinary frequency  Urinary urgency     Discharge Instructions     Your urinalysis was negative today.  We have sent your urine culture and will call if we need to treat you with antibiotics  Take all of your medications as previously prescribed  Follow up with  your Primary care  If symptoms worsening over next few days to include, painful urination, nausea, vomiting, fever, return to this clinic      ED Prescriptions    None     PDMP not reviewed this encounter.   Purnell Shoemaker, PA-C 06/29/20 [phone]

## 2020-06-30 LAB — URINE CULTURE: Culture: NO GROWTH

## 2020-07-30 DIAGNOSIS — M545 Low back pain: Secondary | ICD-10-CM | POA: Diagnosis not present

## 2020-07-30 DIAGNOSIS — G4733 Obstructive sleep apnea (adult) (pediatric): Secondary | ICD-10-CM | POA: Diagnosis not present

## 2020-07-30 DIAGNOSIS — R569 Unspecified convulsions: Secondary | ICD-10-CM | POA: Diagnosis not present

## 2020-07-31 ENCOUNTER — Telehealth: Payer: Self-pay | Admitting: *Deleted

## 2020-07-31 NOTE — Telephone Encounter (Signed)
Faxed order for heated humidifier CPAP/BiPAP heated humidifier Attn: [employer]. Confirmation page 1:26 pm.

## 2020-08-02 ENCOUNTER — Other Ambulatory Visit: Payer: Self-pay

## 2020-08-02 NOTE — Patient Outreach (Signed)
[facility]) Care Management  08/02/2020  Brandi Gomez 09/08/1971 [id number]   Reached out to the patient to attempt completing satisfaction survey. No answer. Left message to call back.  [place] Assistant  [phone]

## 2020-08-11 ENCOUNTER — Other Ambulatory Visit: Payer: Self-pay

## 2020-08-11 NOTE — Patient Outreach (Signed)
[facility]) Care Management  08/11/2020  Brandi Gomez Dec 20, 1970 [id number]   Telephone call to patient for disease management follow up.   No answer.  HIPAA compliant voice message left.    Plan: If no return call, RN CM will attempt patient again in the month of December.   [name], RN, MSN [facility] Care Management Coordinator Direct Line [phone] Cell [phone] Toll Free: [phone]  Fax: [phone]

## 2020-08-22 DIAGNOSIS — G4733 Obstructive sleep apnea (adult) (pediatric): Secondary | ICD-10-CM | POA: Diagnosis not present

## 2020-08-25 ENCOUNTER — Telehealth (HOSPITAL_COMMUNITY): Payer: Medicare HMO | Admitting: Psychiatry

## 2020-08-29 ENCOUNTER — Other Ambulatory Visit: Payer: Self-pay

## 2020-08-29 ENCOUNTER — Encounter: Payer: Self-pay | Admitting: Emergency Medicine

## 2020-08-29 ENCOUNTER — Ambulatory Visit (INDEPENDENT_AMBULATORY_CARE_PROVIDER_SITE_OTHER): Payer: Medicare HMO | Admitting: Emergency Medicine

## 2020-08-29 VITALS — BP 159/87 | HR 79 | Temp 98.1°F | Resp 16 | Ht 61.0 in | Wt 316.0 lb

## 2020-08-29 DIAGNOSIS — N1831 Chronic kidney disease, stage 3a: Secondary | ICD-10-CM

## 2020-08-29 DIAGNOSIS — I110 Hypertensive heart disease with heart failure: Secondary | ICD-10-CM

## 2020-08-29 DIAGNOSIS — I10 Essential (primary) hypertension: Secondary | ICD-10-CM | POA: Diagnosis not present

## 2020-08-29 DIAGNOSIS — Z6841 Body Mass Index (BMI) 40.0 and over, adult: Secondary | ICD-10-CM

## 2020-08-29 DIAGNOSIS — M2550 Pain in unspecified joint: Secondary | ICD-10-CM

## 2020-08-29 DIAGNOSIS — E1159 Type 2 diabetes mellitus with other circulatory complications: Secondary | ICD-10-CM

## 2020-08-29 DIAGNOSIS — I5042 Chronic combined systolic (congestive) and diastolic (congestive) heart failure: Secondary | ICD-10-CM

## 2020-08-29 LAB — COMPREHENSIVE METABOLIC PANEL
ALT: 11 IU/L (ref 0–32)
AST: 10 IU/L (ref 0–40)
Albumin/Globulin Ratio: 1.2 (ref 1.2–2.2)
Albumin: 3.6 g/dL — ABNORMAL LOW (ref 3.8–4.9)
Alkaline Phosphatase: 126 IU/L — ABNORMAL HIGH (ref 44–121)
BUN/Creatinine Ratio: 14 (ref 9–23)
BUN: 19 mg/dL (ref 6–24)
Bilirubin Total: <0.2 mg/dL (ref 0.0–1.2)
CO2: 20 mmol/L (ref 20–29)
Calcium: 9.1 mg/dL (ref 8.7–10.2)
Chloride: 100 mmol/L (ref 96–106)
Creatinine, Ser: 1.39 mg/dL — ABNORMAL HIGH (ref 0.57–1.00)
GFR calc Af Amer: 51 mL/min/{1.73_m2} — ABNORMAL LOW (ref >59)
GFR calc non Af Amer: 44 mL/min/{1.73_m2} — ABNORMAL LOW (ref >59)
Globulin, Total: 3 g/dL (ref 1.5–4.5)
Glucose: 228 mg/dL — ABNORMAL HIGH (ref 65–99)
Potassium: 4 mmol/L (ref 3.5–5.2)
Sodium: 136 mmol/L (ref 134–144)
Total Protein: 6.6 g/dL (ref 6.0–8.5)

## 2020-08-29 LAB — POCT GLYCOSYLATED HEMOGLOBIN (HGB A1C): Hemoglobin A1C: 8.5 % — AB (ref 4.0–5.6)

## 2020-08-29 LAB — GLUCOSE, POCT (MANUAL RESULT ENTRY): POC Glucose: 254 mg/dl — AB (ref 70–99)

## 2020-08-29 MED ORDER — GLIPIZIDE 5 MG PO TABS: 5.0000 mg | ORAL_TABLET | Freq: Two times a day (BID) | ORAL | 1 refills | Status: DC

## 2020-08-29 MED ORDER — SITAGLIPTIN PHOSPHATE 50 MG PO TABS: 50.0000 mg | ORAL_TABLET | Freq: Every day | ORAL | 3 refills | Status: DC

## 2020-08-29 MED ORDER — CARVEDILOL 6.25 MG PO TABS: 6.2500 mg | ORAL_TABLET | Freq: Two times a day (BID) | ORAL | 3 refills | Status: DC

## 2020-08-29 NOTE — Assessment & Plan Note (Signed)
Uncontrolled diabetes with hemoglobin A1c at 8.5.  I am uncertain about patient's compliance with medication.  Needs to be on glipizide 5 mg twice a day.  Patient prefers noninjectables.  Patient's kidney function is compromised.  Will start Januvia 50 mg daily.  Weight becoming an issue.  Will refer for medical nutrition evaluation. Blood pressure elevated.  Recommend to increase carvedilol to 6.25 mg twice daily. Follow-up in 3 months.

## 2020-08-29 NOTE — Patient Instructions (Addendum)
Must take glipizide 5 mg twice a day with meals. Start Januvia 50 mg daily. Increase carvedilol to 6.25 mg twice a day. Tylenol for pain as needed.  Avoid NSAIDs such as Motrin, ibuprofen, Advil, and Aleve. Refer to medical nutrition clinic. Follow-up in 3 months.  Diabetes Mellitus and Nutrition, Adult When you have diabetes (diabetes mellitus), it is very important to have healthy eating habits because your blood sugar (glucose) levels are greatly affected by what you eat and drink. Eating healthy foods in the appropriate amounts, at about the same times every day, can help you:  Control your blood glucose.  Lower your risk of heart disease.  Improve your blood pressure.  Reach or maintain a healthy weight. Every person with diabetes is different, and each person has different needs for a meal plan. Your health care provider may recommend that you work with a diet and nutrition specialist (dietitian) to make a meal plan that is best for you. Your meal plan may vary depending on factors such as:  The calories you need.  The medicines you take.  Your weight.  Your blood glucose, blood pressure, and cholesterol levels.  Your activity level.  Other health conditions you have, such as heart or kidney disease. How do carbohydrates affect me? Carbohydrates, also called carbs, affect your blood glucose level more than any other type of food. Eating carbs naturally raises the amount of glucose in your blood. Carb counting is a method for keeping track of how many carbs you eat. Counting carbs is important to keep your blood glucose at a healthy level, especially if you use insulin or take certain oral diabetes medicines. It is important to know how many carbs you can safely have in each meal. This is different for every person. Your dietitian can help you calculate how many carbs you should have at each meal and for each snack. Foods that contain carbs include:  Bread, cereal, rice,  pasta, and crackers.  Potatoes and corn.  Peas, beans, and lentils.  Milk and yogurt.  Fruit and juice.  Desserts, such as cakes, cookies, ice cream, and candy. How does alcohol affect me? Alcohol can cause a sudden decrease in blood glucose (hypoglycemia), especially if you use insulin or take certain oral diabetes medicines. Hypoglycemia can be a life-threatening condition. Symptoms of hypoglycemia (sleepiness, dizziness, and confusion) are similar to symptoms of having too much alcohol. If your health care provider says that alcohol is safe for you, follow these guidelines:  Limit alcohol intake to no more than 1 drink per day for nonpregnant women and 2 drinks per day for men. One drink equals 12 oz of beer, 5 oz of wine, or 1 oz of hard liquor.  Do not drink on an empty stomach.  Keep yourself hydrated with water, diet soda, or unsweetened iced tea.  Keep in mind that regular soda, juice, and other mixers may contain a lot of sugar and must be counted as carbs. What are tips for following this plan?  Reading food labels  Start by checking the serving size on the "Nutrition Facts" label of packaged foods and drinks. The amount of calories, carbs, fats, and other nutrients listed on the label is based on one serving of the item. Many items contain more than one serving per package.  Check the total grams (g) of carbs in one serving. You can calculate the number of servings of carbs in one serving by dividing the total carbs by 15. For example, if  a food has 30 g of total carbs, it would be equal to 2 servings of carbs.  Check the number of grams (g) of saturated and trans fats in one serving. Choose foods that have low or no amount of these fats.  Check the number of milligrams (mg) of salt (sodium) in one serving. Most people should limit total sodium intake to less than 2,300 mg per day.  Always check the nutrition information of foods labeled as "low-fat" or "nonfat". These  foods may be higher in added sugar or refined carbs and should be avoided.  Talk to your dietitian to identify your daily goals for nutrients listed on the label. Shopping  Avoid buying canned, premade, or processed foods. These foods tend to be high in fat, sodium, and added sugar.  Shop around the outside edge of the grocery store. This includes fresh fruits and vegetables, bulk grains, fresh meats, and fresh dairy. Cooking  Use low-heat cooking methods, such as baking, instead of high-heat cooking methods like deep frying.  Cook using healthy oils, such as olive, canola, or sunflower oil.  Avoid cooking with butter, cream, or high-fat meats. Meal planning  Eat meals and snacks regularly, preferably at the same times every day. Avoid going long periods of time without eating.  Eat foods high in fiber, such as fresh fruits, vegetables, beans, and whole grains. Talk to your dietitian about how many servings of carbs you can eat at each meal.  Eat 4-6 ounces (oz) of lean protein each day, such as lean meat, chicken, fish, eggs, or tofu. One oz of lean protein is equal to: ? 1 oz of meat, chicken, or fish. ? 1 egg. ?  cup of tofu.  Eat some foods each day that contain healthy fats, such as avocado, nuts, seeds, and fish. Lifestyle  Check your blood glucose regularly.  Exercise regularly as told by your health care provider. This may include: ? 150 minutes of moderate-intensity or vigorous-intensity exercise each week. This could be brisk walking, biking, or water aerobics. ? Stretching and doing strength exercises, such as yoga or weightlifting, at least 2 times a week.  Take medicines as told by your health care provider.  Do not use any products that contain nicotine or tobacco, such as cigarettes and e-cigarettes. If you need help quitting, ask your health care provider.  Work with a Social worker or diabetes educator to identify strategies to manage stress and any emotional and  social challenges. Questions to ask a health care provider  Do I need to meet with a diabetes educator?  Do I need to meet with a dietitian?  What number can I call if I have questions?  When are the best times to check my blood glucose? Where to find more information:  American Diabetes Association: diabetes.org  Academy of Nutrition and Dietetics: www.eatright.[employer] of Diabetes and Digestive and Kidney Diseases (NIH): [employer] Summary  A healthy meal plan will help you control your blood glucose and maintain a healthy lifestyle.  Working with a diet and nutrition specialist (dietitian) can help you make a meal plan that is best for you.  Keep in mind that carbohydrates (carbs) and alcohol have immediate effects on your blood glucose levels. It is important to count carbs and to use alcohol carefully. This information is not intended to replace advice given to you by your health care provider. Make sure you discuss any questions you have with your health care provider. Document Revised: 11/07/2017 Document  Reviewed: 12/30/2016 Elsevier Patient Education  [employer].      If you have lab work done today you will be contacted with your lab results within the next 2 weeks.  If you have not heard from [place] then please contact us. The fastest way to get your results is to register for My Chart.   IF you received an x-ray today, you will receive an invoice from [facility]. Please contact [facility] at [phone] with questions or concerns regarding your invoice.   IF you received labwork today, you will receive an invoice from [facility]. Please contact [facility] at [phone] with questions or concerns regarding your invoice.   Our billing staff will not be able to assist you with questions regarding bills from these companies.  You will be contacted with the lab results as soon as they are available. The fastest way to get  your results is to activate your My Chart account. Instructions are located on the last page of this paperwork. If you have not heard from [place] regarding the results in 2 weeks, please contact this office.      Calorie Counting for Weight Loss Calories are units of energy. Your body needs a certain amount of calories from food to keep you going throughout the day. When you eat more calories than your body needs, your body stores the extra calories as fat. When you eat fewer calories than your body needs, your body burns fat to get the energy it needs. Calorie counting means keeping track of how many calories you eat and drink each day. Calorie counting can be helpful if you need to lose weight. If you make sure to eat fewer calories than your body needs, you should lose weight. Ask your health care provider what a healthy weight is for you. For calorie counting to work, you will need to eat the right number of calories in a day in order to lose a healthy amount of weight per week. A dietitian can help you determine how many calories you need in a day and will give you suggestions on how to reach your calorie goal.  A healthy amount of weight to lose per week is usually 1-2 lb (0.5-0.9 kg). This usually means that your daily calorie intake should be reduced by 500-750 calories.  Eating 1,200 - 1,500 calories per day can help most women lose weight.  Eating 1,500 - 1,800 calories per day can help most men lose weight. What is my plan? My goal is to have __________ calories per day. If I have this many calories per day, I should lose around __________ pounds per week. What do I need to know about calorie counting? In order to meet your daily calorie goal, you will need to:  Find out how many calories are in each food you would like to eat. Try to do this before you eat.  Decide how much of the food you plan to eat.  Write down what you ate and how many calories it had. Doing this is called  keeping a food log. To successfully lose weight, it is important to balance calorie counting with a healthy lifestyle that includes regular activity. Aim for 150 minutes of moderate exercise (such as walking) or 75 minutes of vigorous exercise (such as running) each week. Where do I find calorie information?  The number of calories in a food can be found on a Nutrition Facts label. If a food does not have a Nutrition Facts  label, try to look up the calories online or ask your dietitian for help. Remember that calories are listed per serving. If you choose to have more than one serving of a food, you will have to multiply the calories per serving by the amount of servings you plan to eat. For example, the label on a package of bread might say that a serving size is 1 slice and that there are 90 calories in a serving. If you eat 1 slice, you will have eaten 90 calories. If you eat 2 slices, you will have eaten 180 calories. How do I keep a food log? Immediately after each meal, record the following information in your food log:  What you ate. Don't forget to include toppings, sauces, and other extras on the food.  How much you ate. This can be measured in cups, ounces, or number of items.  How many calories each food and drink had.  The total number of calories in the meal. Keep your food log near you, such as in a small notebook in your pocket, or use a mobile app or website. Some programs will calculate calories for you and show you how many calories you have left for the day to meet your goal. What are some calorie counting tips?   Use your calories on foods and drinks that will fill you up and not leave you hungry: ? Some examples of foods that fill you up are nuts and nut butters, vegetables, lean proteins, and high-fiber foods like whole grains. High-fiber foods are foods with more than 5 g fiber per serving. ? Drinks such as sodas, specialty coffee drinks, alcohol, and juices have a lot of  calories, yet do not fill you up.  Eat nutritious foods and avoid empty calories. Empty calories are calories you get from foods or beverages that do not have many vitamins or protein, such as candy, sweets, and soda. It is better to have a nutritious high-calorie food (such as an avocado) than a food with few nutrients (such as a bag of chips).  Know how many calories are in the foods you eat most often. This will help you calculate calorie counts faster.  Pay attention to calories in drinks. Low-calorie drinks include water and unsweetened drinks.  Pay attention to nutrition labels for "low fat" or "fat free" foods. These foods sometimes have the same amount of calories or more calories than the full fat versions. They also often have added sugar, starch, or salt, to make up for flavor that was removed with the fat.  Find a way of tracking calories that works for you. Get creative. Try different apps or programs if writing down calories does not work for you. What are some portion control tips?  Know how many calories are in a serving. This will help you know how many servings of a certain food you can have.  Use a measuring cup to measure serving sizes. You could also try weighing out portions on a kitchen scale. With time, you will be able to estimate serving sizes for some foods.  Take some time to put servings of different foods on your favorite plates, bowls, and cups so you know what a serving looks like.  Try not to eat straight from a bag or box. Doing this can lead to overeating. Put the amount you would like to eat in a cup or on a plate to make sure you are eating the right portion.  Use smaller plates, glasses,  and bowls to prevent overeating.  Try not to multitask (for example, watch TV or use your computer) while eating. If it is time to eat, sit down at a table and enjoy your food. This will help you to know when you are full. It will also help you to be aware of what you are  eating and how much you are eating. What are tips for following this plan? Reading food labels  Check the calorie count compared to the serving size. The serving size may be smaller than what you are used to eating.  Check the source of the calories. Make sure the food you are eating is high in vitamins and protein and low in saturated and trans fats. Shopping  Read nutrition labels while you shop. This will help you make healthy decisions before you decide to purchase your food.  Make a grocery list and stick to it. Cooking  Try to cook your favorite foods in a healthier way. For example, try baking instead of frying.  Use low-fat dairy products. Meal planning  Use more fruits and vegetables. Half of your plate should be fruits and vegetables.  Include lean proteins like poultry and fish. How do I count calories when eating out?  Ask for smaller portion sizes.  Consider sharing an entree and sides instead of getting your own entree.  If you get your own entree, eat only half. Ask for a box at the beginning of your meal and put the rest of your entree in it so you are not tempted to eat it.  If calories are listed on the menu, choose the lower calorie options.  Choose dishes that include vegetables, fruits, whole grains, low-fat dairy products, and lean protein.  Choose items that are boiled, broiled, grilled, or steamed. Stay away from items that are buttered, battered, fried, or served with cream sauce. Items labeled "crispy" are usually fried, unless stated otherwise.  Choose water, low-fat milk, unsweetened iced tea, or other drinks without added sugar. If you want an alcoholic beverage, choose a lower calorie option such as a glass of wine or light beer.  Ask for dressings, sauces, and syrups on the side. These are usually high in calories, so you should limit the amount you eat.  If you want a salad, choose a garden salad and ask for grilled meats. Avoid extra toppings  like bacon, cheese, or fried items. Ask for the dressing on the side, or ask for olive oil and vinegar or lemon to use as dressing.  Estimate how many servings of a food you are given. For example, a serving of cooked rice is  cup or about the size of half a baseball. Knowing serving sizes will help you be aware of how much food you are eating at restaurants. The list below tells you how big or small some common portion sizes are based on everyday objects: ? 1 oz--4 stacked dice. ? 3 oz--1 deck of cards. ? 1 tsp--1 die. ? 1 Tbsp-- a ping-pong ball. ? 2 Tbsp--1 ping-pong ball. ?  cup-- baseball. ? 1 cup--1 baseball. Summary  Calorie counting means keeping track of how many calories you eat and drink each day. If you eat fewer calories than your body needs, you should lose weight.  A healthy amount of weight to lose per week is usually 1-2 lb (0.5-0.9 kg). This usually means reducing your daily calorie intake by 500-750 calories.  The number of calories in a food can be found on  a Nutrition Facts label. If a food does not have a Nutrition Facts label, try to look up the calories online or ask your dietitian for help.  Use your calories on foods and drinks that will fill you up, and not on foods and drinks that will leave you hungry.  Use smaller plates, glasses, and bowls to prevent overeating. This information is not intended to replace advice given to you by your health care provider. Make sure you discuss any questions you have with your health care provider. Document Revised: 08/14/2018 Document Reviewed: 10/25/2016 Elsevier Patient Education  [employer].

## 2020-08-29 NOTE — Progress Notes (Signed)
Brandi Gomez 49 y.o.   Chief Complaint  Patient presents with  . Arthritis    3 weeks in the ankles, knees and legs    HISTORY OF PRESENT ILLNESS: This is a 49 y.o. female complaining of "arthritis" pain to knees and ankles for the past several weeks.  Denies injuries. History of diabetes and hypertensive heart disease with congestive heart failure. Also history of chronic kidney disease. Lives with boyfriend and 54 year old daughter helps her with medications. Patient is somewhat mentally challenged. Lab Results  Component Value Date   HGBA1C 7.3 (H) 04/18/2020   BP Readings from Last 3 Encounters:  08/29/20 (!) 159/87  06/29/20 (!) 142/70  05/23/20 (!) 142/90    HPI   Prior to Admission medications   Medication Sig Start Date End Date Taking? Authorizing Provider  Acetaminophen (TYLENOL ARTHRITIS PAIN PO) Take by mouth as needed.   Yes [provider]  ARIPiprazole (ABILIFY) 10 MG tablet Take 1 tablet (10 mg total) by mouth daily. 05/24/20 05/24/21 Yes Plovsky, Berneta Sages, MD  lamoTRIgine (LAMICTAL) 150 MG tablet Take 1 tablet (150 mg total) by mouth 2 (two) times daily. 05/02/20  Yes [name], MD  torsemide (DEMADEX) 10 MG tablet Take 10 mg by mouth daily.   Yes [provider]  acetaminophen (TYLENOL) 325 MG tablet Take 2 tablets (650 mg total) by mouth every 4 (four) hours as needed for mild pain (or temp > 37.5 C (99.5 F)). Patient not taking: Reported on 08/29/2020 10/19/19   Angiulli, Lavon Paganini, PA-C  atorvastatin (LIPITOR) 40 MG tablet Take 1 tablet (40 mg total) by mouth daily at 6 PM. 01/04/20 05/02/20  Horald Pollen, MD  carvedilol (COREG) 3.125 MG tablet Take 1 tablet (3.125 mg total) by mouth 2 (two) times daily. 01/04/20 05/02/20  Horald Pollen, MD  glipiZIDE (GLUCOTROL) 5 MG tablet Take 1 tablet (5 mg total) by mouth 2 (two) times daily with a meal. 04/19/20 07/18/20  Kingstin Heims, Ines Bloomer, MD  hydrALAZINE (APRESOLINE) 10 MG  tablet Take 1 tablet (10 mg total) by mouth 2 (two) times daily. 01/04/20 05/02/20  Horald Pollen, MD  isosorbide dinitrate (ISORDIL) 10 MG tablet Take 1 tablet (10 mg total) by mouth 2 (two) times daily. 01/04/20 05/02/20  Horald Pollen, MD  methylphenidate (RITALIN) 5 MG tablet Take 1 tablet (5 mg total) by mouth 2 (two) times daily. Patient not taking: Reported on 12/29/2019 10/29/19 01/01/20  Izora Ribas, MD  traZODone (DESYREL) 100 MG tablet Take 1 tablet (100 mg total) by mouth at bedtime. Patient not taking: Reported on 12/29/2019 10/19/19 01/01/20  Angiulli, Lavon Paganini, PA-C    Allergies  Allergen Reactions  . Amoxicillin Itching    Did it involve swelling of the face/tongue/throat, SOB, or low BP? No Did it involve sudden or severe rash/hives, skin peeling, or any reaction on the inside of your mouth or nose? No Did you need to seek medical attention at a hospital or doctor's office? No When did it last happen?within the past 10 years If all above answers are "NO", may proceed with cephalosporin use.   Marland Kitchen Lisinopril Swelling    Angioedema   . Hydrocodone Other (See Comments)    Depressed   . Tegretol [Carbamazepine] Swelling    Throat swells    Patient Active Problem List   Diagnosis Date Noted  . Morbid (severe) obesity due to excess calories ([place]) 04/18/2020  . HLD (hyperlipidemia) 10/12/2019  . CKD (chronic kidney  disease) stage 3, GFR 30-59 ml/min 10/12/2019  . Chronic combined systolic and diastolic heart failure ([place])   . Depression 07/14/2019  . Bipolar disorder ([place]) 05/13/2019  . Bipolar 1 disorder ([place]) 05/13/2019  . Adjustment disorder with anxiety   . History of recent stroke 08/06/2017  . OSA (obstructive sleep apnea)   . Controlled type 2 diabetes mellitus without complication, without long-term current use of insulin ([place]) 05/08/2017  . Essential hypertension 07/27/2008  . Seizure disorder ([place]) 02/05/2007    Past Medical History:   Diagnosis Date  . Anemia   . Anxiety   . Bipolar 1 disorder ([place])   . Common migraine 05/19/2015  . Depression   . Diabetes mellitus, type II ([place])   . Hypertension   . Irritable bowel syndrome (IBS)   . Mild mental retardation   . Obesity   . Partial complex seizure disorder with intractable epilepsy ([place]) 05/12/2014  . Seizures ([place])    intractable, sz 08/23/17  . Sleep apnea   . Stroke ([place])   . Type II or unspecified type diabetes mellitus without mention of complication, not stated as uncontrolled     Past Surgical History:  Procedure Laterality Date  . BUBBLE STUDY  11/15/2019   Procedure: BUBBLE STUDY;  Surgeon: Dorothy Spark, MD;  Location: [facility];  Service: Cardiovascular;;  . COLONOSCOPY     2012-normal , Dr Sharlett Iles  . ESOPHAGOGASTRODUODENOSCOPY     normal-Dr Patterson 2012  . LOOP RECORDER INSERTION N/A 05/30/2017   Procedure: Loop Recorder Insertion;  Surgeon: Constance Haw, MD;  Location: [facility];  Service: Cardiovascular;  Laterality: N/A;  . LOOP RECORDER REMOVAL N/A 03/04/2018   Procedure: LOOP RECORDER REMOVAL;  Surgeon: Constance Haw, MD;  Location: [facility];  Service: Cardiovascular;  Laterality: N/A;  . MYRINGOTOMY WITH TUBE PLACEMENT    . MYRINGOTOMY WITH TUBE PLACEMENT Right 11/05/2017   Procedure: MYRINGOTOMY WITH TUBE PLACEMENT;  Surgeon: Melissa Montane, MD;  Location: [facility];  Service: ENT;  Laterality: Right;  right T Tube placement  . NASAL SINUS SURGERY    . TEE WITHOUT CARDIOVERSION N/A 05/30/2017   Procedure: TRANSESOPHAGEAL ECHOCARDIOGRAM (TEE);  Surgeon: Acie Fredrickson Wonda Cheng, MD;  Location: [facility];  Service: Cardiovascular;  Laterality: N/A;  . TEE WITHOUT CARDIOVERSION N/A 11/15/2019   Procedure: TRANSESOPHAGEAL ECHOCARDIOGRAM (TEE);  Surgeon: Dorothy Spark, MD;  Location: [facility];  Service: Cardiovascular;  Laterality: N/A;    Social History   Socioeconomic History  . Marital status: Single     Spouse name: [name]  . Number of children: 3  . Years of education: 27  . Highest education level: Not on file  Occupational History  . Occupation: disabled    Employer: DISABLED  Tobacco Use  . Smoking status: Never Smoker  . Smokeless tobacco: Never Used  Vaping Use  . Vaping Use: Never used  Substance and Sexual Activity  . Alcohol use: No  . Drug use: No  . Sexual activity: Never  Other Topics Concern  . Not on file  Social History Narrative   Patient lives at home with daughter.    Patient has 3 children.    Patient is right handed.    Patient has a high school education.    Patient is on disability   Patient drinks 2 cups of caffeine daily.   Social Determinants of Health   Financial Resource Strain:   . Difficulty of Paying Living Expenses: Not on file  Food Insecurity: No Food Insecurity  . Worried About Charity fundraiser in the Last Year: Never true  . Ran Out of Food in the Last Year: Never true  Transportation Needs: No Transportation Needs  . Lack of Transportation (Medical): No  . Lack of Transportation (Non-Medical): No  Physical Activity:   . Days of Exercise per Week: Not on file  . Minutes of Exercise per Session: Not on file  Stress:   . Feeling of Stress : Not on file  Social Connections:   . Frequency of Communication with Friends and Family: Not on file  . Frequency of Social Gatherings with Friends and Family: Not on file  . Attends Religious Services: Not on file  . Active Member of Clubs or Organizations: Not on file  . Attends Archivist Meetings: Not on file  . Marital Status: Not on file  Intimate Partner Violence:   . Fear of Current or Ex-Partner: Not on file  . Emotionally Abused: Not on file  . Physically Abused: Not on file  . Sexually Abused: Not on file    Family History  Problem Relation Age of Onset  . Diabetes Mother        passed away from accidental death  . Hypertension Mother   . Bipolar disorder  Father   . Diabetes Daughter   . Leukemia Daughter   . Ovarian cancer Maternal Aunt      Review of Systems  Constitutional: Negative.  Negative for chills and fever.       Weight gain  HENT: Negative.  Negative for congestion and sore throat.   Respiratory: Negative.  Negative for cough and shortness of breath.   Cardiovascular: Negative.  Negative for chest pain and palpitations.  Gastrointestinal: Negative.  Negative for abdominal pain, blood in stool, diarrhea, nausea and vomiting.  Genitourinary: Negative.  Negative for dysuria and hematuria.  Musculoskeletal: Positive for joint pain (Mostly knees and ankles).  Skin: Negative.  Negative for rash.  Neurological: Negative.  Negative for dizziness and headaches.  All other systems reviewed and are negative.    Physical Exam Vitals reviewed.  Constitutional:      Appearance: She is obese.  HENT:     Head: Normocephalic.  Eyes:     Extraocular Movements: Extraocular movements intact.     Pupils: Pupils are equal, round, and reactive to light.  Cardiovascular:     Rate and Rhythm: Normal rate and regular rhythm.     Pulses: Normal pulses.     Heart sounds: Normal heart sounds.  Pulmonary:     Effort: Pulmonary effort is normal.     Breath sounds: Normal breath sounds.  Abdominal:     Tenderness: There is no abdominal tenderness.  Musculoskeletal:     Cervical back: Normal range of motion and neck supple.     Comments: Upper extremities: Within normal limits.  No signs of arthritis. Lower extremities: No tenderness or abnormal swelling.  No erythema.  Full range of motion.  Neurovascularly intact.  Skin:    General: Skin is warm and dry.     Capillary Refill: Capillary refill takes less than 2 seconds.  Neurological:     General: No focal deficit present.     Mental Status: She is alert and oriented to person, place, and time.  Psychiatric:        Mood and Affect: Mood normal.        Behavior: Behavior normal.     Today's Vitals  08/29/20 0850  BP: (!) 159/87  Pulse: 79  Resp: 16  Temp: 98.1 F (36.7 C)  TempSrc: Temporal  SpO2: 97%  Weight: (!) 316 lb (143.3 kg)  Height: 5\' 1"  (1.549 m)   Body mass index is 59.71 kg/m. Wt Readings from Last 3 Encounters:  08/29/20 (!) 316 lb (143.3 kg)  05/23/20 298 lb (135.2 kg)  05/11/20 296 lb (134.3 kg)   Results for orders placed or performed in visit on 08/29/20 (from the past 24 hour(s))  POCT glucose (manual entry)     Status: Abnormal   Collection Time: 08/29/20  9:34 AM  Result Value Ref Range   POC Glucose 254 (A) 70 - 99 mg/dl  POCT glycosylated hemoglobin (Hb A1C)     Status: Abnormal   Collection Time: 08/29/20  9:39 AM  Result Value Ref Range   Hemoglobin A1C 8.5 (A) 4.0 - 5.6 %   HbA1c POC (<> result, manual entry)     HbA1c, POC (prediabetic range)     HbA1c, POC (controlled diabetic range)     A total of 45 minutes was spent with the patient, greater than 50% of which was in counseling/coordination of care regarding multiple chronic medical problems and cardiovascular risks associated with these conditions, review of all medications and dose changes, diet and nutrition and need for nutritional evaluation, referral to medical nutrition, review of most recent office visit notes, review of most recent blood work results, diabetes and importance of nutrition, new diabetic medication Januvia, prognosis and need for follow-up in 3 months.   ASSESSMENT & PLAN: Hypertension associated with diabetes ([place]) Uncontrolled diabetes with hemoglobin A1c at 8.5.  I am uncertain about patient's compliance with medication.  Needs to be on glipizide 5 mg twice a day.  Patient prefers noninjectables.  Patient's kidney function is compromised.  Will start Januvia 50 mg daily.  Weight becoming an issue.  Will refer for medical nutrition evaluation. Blood pressure elevated.  Recommend to increase carvedilol to 6.25 mg twice daily. Follow-up in 3  months.  Zahli was seen today for arthritis.  Diagnoses and all orders for this visit:  Hypertension associated with diabetes ([place]) -     POCT glucose (manual entry) -     POCT glycosylated hemoglobin (Hb A1C) -     Comprehensive metabolic panel -     glipiZIDE (GLUCOTROL) 5 MG tablet; Take 1 tablet (5 mg total) by mouth 2 (two) times daily with a meal. -     carvedilol (COREG) 6.25 MG tablet; Take 1 tablet (6.25 mg total) by mouth 2 (two) times daily with a meal. -     sitaGLIPtin (JANUVIA) 50 MG tablet; Take 1 tablet (50 mg total) by mouth daily.  Chronic combined systolic and diastolic heart failure (HCC)  Body mass index (BMI) of 50-59.9 in adult ([place]) -     Amb ref to Medical Nutrition Therapy-MNT  Hypertensive heart disease with chronic combined systolic and diastolic congestive heart failure (HCC)  Stage 3a chronic kidney disease  Arthralgia of multiple joints    Patient Instructions   Must take glipizide 5 mg twice a day with meals. Start Januvia 50 mg daily. Increase carvedilol to 6.25 mg twice a day. Tylenol for pain as needed.  Avoid NSAIDs such as Motrin, ibuprofen, Advil, and Aleve. Refer to medical nutrition clinic. Follow-up in 3 months.  Diabetes Mellitus and Nutrition, Adult When you have diabetes (diabetes mellitus), it is very important to have healthy eating habits because your  blood sugar (glucose) levels are greatly affected by what you eat and drink. Eating healthy foods in the appropriate amounts, at about the same times every day, can help you:  Control your blood glucose.  Lower your risk of heart disease.  Improve your blood pressure.  Reach or maintain a healthy weight. Every person with diabetes is different, and each person has different needs for a meal plan. Your health care provider may recommend that you work with a diet and nutrition specialist (dietitian) to make a meal plan that is best for you. Your meal plan may vary depending on  factors such as:  The calories you need.  The medicines you take.  Your weight.  Your blood glucose, blood pressure, and cholesterol levels.  Your activity level.  Other health conditions you have, such as heart or kidney disease. How do carbohydrates affect me? Carbohydrates, also called carbs, affect your blood glucose level more than any other type of food. Eating carbs naturally raises the amount of glucose in your blood. Carb counting is a method for keeping track of how many carbs you eat. Counting carbs is important to keep your blood glucose at a healthy level, especially if you use insulin or take certain oral diabetes medicines. It is important to know how many carbs you can safely have in each meal. This is different for every person. Your dietitian can help you calculate how many carbs you should have at each meal and for each snack. Foods that contain carbs include:  Bread, cereal, rice, pasta, and crackers.  Potatoes and corn.  Peas, beans, and lentils.  Milk and yogurt.  Fruit and juice.  Desserts, such as cakes, cookies, ice cream, and candy. How does alcohol affect me? Alcohol can cause a sudden decrease in blood glucose (hypoglycemia), especially if you use insulin or take certain oral diabetes medicines. Hypoglycemia can be a life-threatening condition. Symptoms of hypoglycemia (sleepiness, dizziness, and confusion) are similar to symptoms of having too much alcohol. If your health care provider says that alcohol is safe for you, follow these guidelines:  Limit alcohol intake to no more than 1 drink per day for nonpregnant women and 2 drinks per day for men. One drink equals 12 oz of beer, 5 oz of wine, or 1 oz of hard liquor.  Do not drink on an empty stomach.  Keep yourself hydrated with water, diet soda, or unsweetened iced tea.  Keep in mind that regular soda, juice, and other mixers may contain a lot of sugar and must be counted as carbs. What are tips  for following this plan?  Reading food labels  Start by checking the serving size on the "Nutrition Facts" label of packaged foods and drinks. The amount of calories, carbs, fats, and other nutrients listed on the label is based on one serving of the item. Many items contain more than one serving per package.  Check the total grams (g) of carbs in one serving. You can calculate the number of servings of carbs in one serving by dividing the total carbs by 15. For example, if a food has 30 g of total carbs, it would be equal to 2 servings of carbs.  Check the number of grams (g) of saturated and trans fats in one serving. Choose foods that have low or no amount of these fats.  Check the number of milligrams (mg) of salt (sodium) in one serving. Most people should limit total sodium intake to less than 2,300 mg per  day.  Always check the nutrition information of foods labeled as "low-fat" or "nonfat". These foods may be higher in added sugar or refined carbs and should be avoided.  Talk to your dietitian to identify your daily goals for nutrients listed on the label. Shopping  Avoid buying canned, premade, or processed foods. These foods tend to be high in fat, sodium, and added sugar.  Shop around the outside edge of the grocery store. This includes fresh fruits and vegetables, bulk grains, fresh meats, and fresh dairy. Cooking  Use low-heat cooking methods, such as baking, instead of high-heat cooking methods like deep frying.  Cook using healthy oils, such as olive, canola, or sunflower oil.  Avoid cooking with butter, cream, or high-fat meats. Meal planning  Eat meals and snacks regularly, preferably at the same times every day. Avoid going long periods of time without eating.  Eat foods high in fiber, such as fresh fruits, vegetables, beans, and whole grains. Talk to your dietitian about how many servings of carbs you can eat at each meal.  Eat 4-6 ounces (oz) of lean protein each  day, such as lean meat, chicken, fish, eggs, or tofu. One oz of lean protein is equal to: ? 1 oz of meat, chicken, or fish. ? 1 egg. ?  cup of tofu.  Eat some foods each day that contain healthy fats, such as avocado, nuts, seeds, and fish. Lifestyle  Check your blood glucose regularly.  Exercise regularly as told by your health care provider. This may include: ? 150 minutes of moderate-intensity or vigorous-intensity exercise each week. This could be brisk walking, biking, or water aerobics. ? Stretching and doing strength exercises, such as yoga or weightlifting, at least 2 times a week.  Take medicines as told by your health care provider.  Do not use any products that contain nicotine or tobacco, such as cigarettes and e-cigarettes. If you need help quitting, ask your health care provider.  Work with a Social worker or diabetes educator to identify strategies to manage stress and any emotional and social challenges. Questions to ask a health care provider  Do I need to meet with a diabetes educator?  Do I need to meet with a dietitian?  What number can I call if I have questions?  When are the best times to check my blood glucose? Where to find more information:  American Diabetes Association: diabetes.org  Academy of Nutrition and Dietetics: www.eatright.[employer] of Diabetes and Digestive and Kidney Diseases (NIH): [employer] Summary  A healthy meal plan will help you control your blood glucose and maintain a healthy lifestyle.  Working with a diet and nutrition specialist (dietitian) can help you make a meal plan that is best for you.  Keep in mind that carbohydrates (carbs) and alcohol have immediate effects on your blood glucose levels. It is important to count carbs and to use alcohol carefully. This information is not intended to replace advice given to you by your health care provider. Make sure you discuss any questions you have with your  health care provider. Document Revised: 11/07/2017 Document Reviewed: 12/30/2016 Elsevier Patient Education  [employer].      If you have lab work done today you will be contacted with your lab results within the next 2 weeks.  If you have not heard from [place] then please contact us. The fastest way to get your results is to register for My Chart.   IF you received an x-ray today, you will  receive an Pharmacologist from [facility]. Please contact [facility] at [phone] with questions or concerns regarding your invoice.   IF you received labwork today, you will receive an invoice from [facility]. Please contact [facility] at [phone] with questions or concerns regarding your invoice.   Our billing staff will not be able to assist you with questions regarding bills from these companies.  You will be contacted with the lab results as soon as they are available. The fastest way to get your results is to activate your My Chart account. Instructions are located on the last page of this paperwork. If you have not heard from [place] regarding the results in 2 weeks, please contact this office.      Calorie Counting for Weight Loss Calories are units of energy. Your body needs a certain amount of calories from food to keep you going throughout the day. When you eat more calories than your body needs, your body stores the extra calories as fat. When you eat fewer calories than your body needs, your body burns fat to get the energy it needs. Calorie counting means keeping track of how many calories you eat and drink each day. Calorie counting can be helpful if you need to lose weight. If you make sure to eat fewer calories than your body needs, you should lose weight. Ask your health care provider what a healthy weight is for you. For calorie counting to work, you will need to eat the right number of calories in a day in order to lose a healthy amount of weight per week. A dietitian  can help you determine how many calories you need in a day and will give you suggestions on how to reach your calorie goal.  A healthy amount of weight to lose per week is usually 1-2 lb (0.5-0.9 kg). This usually means that your daily calorie intake should be reduced by 500-750 calories.  Eating 1,200 - 1,500 calories per day can help most women lose weight.  Eating 1,500 - 1,800 calories per day can help most men lose weight. What is my plan? My goal is to have __________ calories per day. If I have this many calories per day, I should lose around __________ pounds per week. What do I need to know about calorie counting? In order to meet your daily calorie goal, you will need to:  Find out how many calories are in each food you would like to eat. Try to do this before you eat.  Decide how much of the food you plan to eat.  Write down what you ate and how many calories it had. Doing this is called keeping a food log. To successfully lose weight, it is important to balance calorie counting with a healthy lifestyle that includes regular activity. Aim for 150 minutes of moderate exercise (such as walking) or 75 minutes of vigorous exercise (such as running) each week. Where do I find calorie information?  The number of calories in a food can be found on a Nutrition Facts label. If a food does not have a Nutrition Facts label, try to look up the calories online or ask your dietitian for help. Remember that calories are listed per serving. If you choose to have more than one serving of a food, you will have to multiply the calories per serving by the amount of servings you plan to eat. For example, the label on a package of bread might say that a serving size is 1 slice and  that there are 90 calories in a serving. If you eat 1 slice, you will have eaten 90 calories. If you eat 2 slices, you will have eaten 180 calories. How do I keep a food log? Immediately after each meal, record the following  information in your food log:  What you ate. Don't forget to include toppings, sauces, and other extras on the food.  How much you ate. This can be measured in cups, ounces, or number of items.  How many calories each food and drink had.  The total number of calories in the meal. Keep your food log near you, such as in a small notebook in your pocket, or use a mobile app or website. Some programs will calculate calories for you and show you how many calories you have left for the day to meet your goal. What are some calorie counting tips?   Use your calories on foods and drinks that will fill you up and not leave you hungry: ? Some examples of foods that fill you up are nuts and nut butters, vegetables, lean proteins, and high-fiber foods like whole grains. High-fiber foods are foods with more than 5 g fiber per serving. ? Drinks such as sodas, specialty coffee drinks, alcohol, and juices have a lot of calories, yet do not fill you up.  Eat nutritious foods and avoid empty calories. Empty calories are calories you get from foods or beverages that do not have many vitamins or protein, such as candy, sweets, and soda. It is better to have a nutritious high-calorie food (such as an avocado) than a food with few nutrients (such as a bag of chips).  Know how many calories are in the foods you eat most often. This will help you calculate calorie counts faster.  Pay attention to calories in drinks. Low-calorie drinks include water and unsweetened drinks.  Pay attention to nutrition labels for "low fat" or "fat free" foods. These foods sometimes have the same amount of calories or more calories than the full fat versions. They also often have added sugar, starch, or salt, to make up for flavor that was removed with the fat.  Find a way of tracking calories that works for you. Get creative. Try different apps or programs if writing down calories does not work for you. What are some portion control  tips?  Know how many calories are in a serving. This will help you know how many servings of a certain food you can have.  Use a measuring cup to measure serving sizes. You could also try weighing out portions on a kitchen scale. With time, you will be able to estimate serving sizes for some foods.  Take some time to put servings of different foods on your favorite plates, bowls, and cups so you know what a serving looks like.  Try not to eat straight from a bag or box. Doing this can lead to overeating. Put the amount you would like to eat in a cup or on a plate to make sure you are eating the right portion.  Use smaller plates, glasses, and bowls to prevent overeating.  Try not to multitask (for example, watch TV or use your computer) while eating. If it is time to eat, sit down at a table and enjoy your food. This will help you to know when you are full. It will also help you to be aware of what you are eating and how much you are eating. What are tips for following  this plan? Reading food labels  Check the calorie count compared to the serving size. The serving size may be smaller than what you are used to eating.  Check the source of the calories. Make sure the food you are eating is high in vitamins and protein and low in saturated and trans fats. Shopping  Read nutrition labels while you shop. This will help you make healthy decisions before you decide to purchase your food.  Make a grocery list and stick to it. Cooking  Try to cook your favorite foods in a healthier way. For example, try baking instead of frying.  Use low-fat dairy products. Meal planning  Use more fruits and vegetables. Half of your plate should be fruits and vegetables.  Include lean proteins like poultry and fish. How do I count calories when eating out?  Ask for smaller portion sizes.  Consider sharing an entree and sides instead of getting your own entree.  If you get your own entree, eat only  half. Ask for a box at the beginning of your meal and put the rest of your entree in it so you are not tempted to eat it.  If calories are listed on the menu, choose the lower calorie options.  Choose dishes that include vegetables, fruits, whole grains, low-fat dairy products, and lean protein.  Choose items that are boiled, broiled, grilled, or steamed. Stay away from items that are buttered, battered, fried, or served with cream sauce. Items labeled "crispy" are usually fried, unless stated otherwise.  Choose water, low-fat milk, unsweetened iced tea, or other drinks without added sugar. If you want an alcoholic beverage, choose a lower calorie option such as a glass of wine or light beer.  Ask for dressings, sauces, and syrups on the side. These are usually high in calories, so you should limit the amount you eat.  If you want a salad, choose a garden salad and ask for grilled meats. Avoid extra toppings like bacon, cheese, or fried items. Ask for the dressing on the side, or ask for olive oil and vinegar or lemon to use as dressing.  Estimate how many servings of a food you are given. For example, a serving of cooked rice is  cup or about the size of half a baseball. Knowing serving sizes will help you be aware of how much food you are eating at restaurants. The list below tells you how big or small some common portion sizes are based on everyday objects: ? 1 oz--4 stacked dice. ? 3 oz--1 deck of cards. ? 1 tsp--1 die. ? 1 Tbsp-- a ping-pong ball. ? 2 Tbsp--1 ping-pong ball. ?  cup-- baseball. ? 1 cup--1 baseball. Summary  Calorie counting means keeping track of how many calories you eat and drink each day. If you eat fewer calories than your body needs, you should lose weight.  A healthy amount of weight to lose per week is usually 1-2 lb (0.5-0.9 kg). This usually means reducing your daily calorie intake by 500-750 calories.  The number of calories in a food can be found on a  Nutrition Facts label. If a food does not have a Nutrition Facts label, try to look up the calories online or ask your dietitian for help.  Use your calories on foods and drinks that will fill you up, and not on foods and drinks that will leave you hungry.  Use smaller plates, glasses, and bowls to prevent overeating. This information is not intended to replace advice  given to you by your health care provider. Make sure you discuss any questions you have with your health care provider. Document Revised: 08/14/2018 Document Reviewed: 10/25/2016 Elsevier Patient Education  2020 Elsevier Inc.      Agustina Caroli, MD [facility]

## 2020-08-30 DIAGNOSIS — R569 Unspecified convulsions: Secondary | ICD-10-CM | POA: Diagnosis not present

## 2020-08-30 DIAGNOSIS — M545 Low back pain: Secondary | ICD-10-CM | POA: Diagnosis not present

## 2020-08-30 DIAGNOSIS — G4733 Obstructive sleep apnea (adult) (pediatric): Secondary | ICD-10-CM | POA: Diagnosis not present

## 2020-09-01 ENCOUNTER — Telehealth (INDEPENDENT_AMBULATORY_CARE_PROVIDER_SITE_OTHER): Payer: Medicare HMO | Admitting: Psychiatry

## 2020-09-01 ENCOUNTER — Other Ambulatory Visit: Payer: Self-pay

## 2020-09-01 ENCOUNTER — Other Ambulatory Visit (HOSPITAL_COMMUNITY): Payer: Self-pay | Admitting: Psychiatry

## 2020-09-01 DIAGNOSIS — F22 Delusional disorders: Secondary | ICD-10-CM

## 2020-09-01 MED ORDER — DESIPRAMINE HCL 25 MG PO TABS: ORAL_TABLET | ORAL | 3 refills | Status: DC

## 2020-09-01 MED ORDER — ARIPIPRAZOLE 5 MG PO TABS: ORAL_TABLET | ORAL | 3 refills | Status: DC

## 2020-09-01 NOTE — Progress Notes (Signed)
Psychiatric Initial Adult Assessment   Patient Identification: Brandi Gomez MRN:  [id number] Date of Evaluation:  09/01/2020 Referral Source: Dr. Tory Emerald Chief Complaint:  Suspiciousness/paranoia Visit Diagnosis: Mood disorder secondary to CVA  Today patient was interviewed with [name] her boyfriend.  Biggest problem at this time is the patient is gained about 40 pounds weighs over 300 pounds.  She is having a hard time getting around.  Patient says she is depressed about being overweight.  She is obviously eating excessively.  She sleeps fair.  She is now not getting around very much not trying to leave not trying to elope.  This is probably because of her weight more than anything.  The patient is not overtly psychotic.  She drinks no alcohol and uses no drugs.  Medically she is not very stable.  Her diabetes is probably out of control.  She does not seem to be overtly psychotic at this time.  I shared with her that it is unlikely that the Abilify has made her gain so much weight in the last few months.  She been on Abilify for well over a few years.  The patient lives with her boyfriend.  Because of that I think she functions reasonably well.  She is not suicidal. (Hypo) Manic Symptoms:   Anxiety Symptoms:   Psychotic Symptoms:   PTSD Symptoms:   Past Psychiatric History: Cymbalta 30 mg Previous Psychotropic Medications: Cymbalta 30 mg  Substance Abuse History in the last 12 months:  No.  Consequences of Substance Abuse:   Past Medical History:  Past Medical History:  Diagnosis Date  . Anemia   . Anxiety   . Bipolar 1 disorder ([place])   . Common migraine 05/19/2015  . Depression   . Diabetes mellitus, type II ([place])   . Hypertension   . Irritable bowel syndrome (IBS)   . Mild mental retardation   . Obesity   . Partial complex seizure disorder with intractable epilepsy ([place]) 05/12/2014  . Seizures ([place])    intractable, sz 08/23/17  . Sleep apnea   . Stroke ([place])   . Type II  or unspecified type diabetes mellitus without mention of complication, not stated as uncontrolled     Past Surgical History:  Procedure Laterality Date  . BUBBLE STUDY  11/15/2019   Procedure: BUBBLE STUDY;  Surgeon: Dorothy Spark, MD;  Location: [facility];  Service: Cardiovascular;;  . COLONOSCOPY     2012-normal , Dr Sharlett Iles  . ESOPHAGOGASTRODUODENOSCOPY     normal-Dr Patterson 2012  . LOOP RECORDER INSERTION N/A 05/30/2017   Procedure: Loop Recorder Insertion;  Surgeon: Constance Haw, MD;  Location: [facility];  Service: Cardiovascular;  Laterality: N/A;  . LOOP RECORDER REMOVAL N/A 03/04/2018   Procedure: LOOP RECORDER REMOVAL;  Surgeon: Constance Haw, MD;  Location: [facility];  Service: Cardiovascular;  Laterality: N/A;  . MYRINGOTOMY WITH TUBE PLACEMENT    . MYRINGOTOMY WITH TUBE PLACEMENT Right 11/05/2017   Procedure: MYRINGOTOMY WITH TUBE PLACEMENT;  Surgeon: Melissa Montane, MD;  Location: [facility];  Service: ENT;  Laterality: Right;  right T Tube placement  . NASAL SINUS SURGERY    . TEE WITHOUT CARDIOVERSION N/A 05/30/2017   Procedure: TRANSESOPHAGEAL ECHOCARDIOGRAM (TEE);  Surgeon: Acie Fredrickson Wonda Cheng, MD;  Location: [facility];  Service: Cardiovascular;  Laterality: N/A;  . TEE WITHOUT CARDIOVERSION N/A 11/15/2019   Procedure: TRANSESOPHAGEAL ECHOCARDIOGRAM (TEE);  Surgeon: Dorothy Spark, MD;  Location: [facility];  Service: Cardiovascular;  Laterality: N/A;    Family Psychiatric History:   Family History:  Family History  Problem Relation Age of Onset  . Diabetes Mother        passed away from accidental death  . Hypertension Mother   . Bipolar disorder Father   . Diabetes Daughter   . Leukemia Daughter   . Ovarian cancer Maternal Aunt     Social History:   Social History   Socioeconomic History  . Marital status: Single    Spouse name: [name]  . Number of children: 3  . Years of education: 55  . Highest education level:  Not on file  Occupational History  . Occupation: disabled    Employer: DISABLED  Tobacco Use  . Smoking status: Never Smoker  . Smokeless tobacco: Never Used  Vaping Use  . Vaping Use: Never used  Substance and Sexual Activity  . Alcohol use: No  . Drug use: No  . Sexual activity: Never  Other Topics Concern  . Not on file  Social History Narrative   Patient lives at home with daughter.    Patient has 3 children.    Patient is right handed.    Patient has a high school education.    Patient is on disability   Patient drinks 2 cups of caffeine daily.   Social Determinants of Health   Financial Resource Strain:   . Difficulty of Paying Living Expenses: Not on file  Food Insecurity: No Food Insecurity  . Worried About Charity fundraiser in the Last Year: Never true  . Ran Out of Food in the Last Year: Never true  Transportation Needs: No Transportation Needs  . Lack of Transportation (Medical): No  . Lack of Transportation (Non-Medical): No  Physical Activity:   . Days of Exercise per Week: Not on file  . Minutes of Exercise per Session: Not on file  Stress:   . Feeling of Stress : Not on file  Social Connections:   . Frequency of Communication with Friends and Family: Not on file  . Frequency of Social Gatherings with Friends and Family: Not on file  . Attends Religious Services: Not on file  . Active Member of Clubs or Organizations: Not on file  . Attends Archivist Meetings: Not on file  . Marital Status: Not on file    Additional Social History:   Allergies:   Allergies  Allergen Reactions  . Amoxicillin Itching    Did it involve swelling of the face/tongue/throat, SOB, or low BP? No Did it involve sudden or severe rash/hives, skin peeling, or any reaction on the inside of your mouth or nose? No Did you need to seek medical attention at a hospital or doctor's office? No When did it last happen?within the past 10 years If all above answers  are "NO", may proceed with cephalosporin use.   Marland Kitchen Lisinopril Swelling    Angioedema   . Hydrocodone Other (See Comments)    Depressed   . Tegretol [Carbamazepine] Swelling    Throat swells    Metabolic Disorder Labs: Lab Results  Component Value Date   HGBA1C 8.5 (A) 08/29/2020   MPG 139.85 10/12/2019   MPG 163 11/05/2017   No results found for: PROLACTIN Lab Results  Component Value Date   CHOL 108 04/18/2020   TRIG 52 04/18/2020   HDL 52 04/18/2020   CHOLHDL 2.1 04/18/2020   VLDL 10 10/12/2019   LDLCALC 44 04/18/2020   LDLCALC 64 10/12/2019  Current Medications: Current Outpatient Medications  Medication Sig Dispense Refill  . Acetaminophen (TYLENOL ARTHRITIS PAIN PO) Take by mouth as needed.    Marland Kitchen acetaminophen (TYLENOL) 325 MG tablet Take 2 tablets (650 mg total) by mouth every 4 (four) hours as needed for mild pain (or temp > 37.5 C (99.5 F)). (Patient not taking: Reported on 08/29/2020)    . ARIPiprazole (ABILIFY) 5 MG tablet 1 qam 30 tablet 3  . atorvastatin (LIPITOR) 40 MG tablet Take 1 tablet (40 mg total) by mouth daily at 6 PM. 90 tablet 3  . carvedilol (COREG) 6.25 MG tablet Take 1 tablet (6.25 mg total) by mouth 2 (two) times daily with a meal. 180 tablet 3  . desipramine (NORPRAMIN) 25 MG tablet 1  qam 30 tablet 3  . glipiZIDE (GLUCOTROL) 5 MG tablet Take 1 tablet (5 mg total) by mouth 2 (two) times daily with a meal. 180 tablet 1  . hydrALAZINE (APRESOLINE) 10 MG tablet Take 1 tablet (10 mg total) by mouth 2 (two) times daily. 180 tablet 3  . isosorbide dinitrate (ISORDIL) 10 MG tablet Take 1 tablet (10 mg total) by mouth 2 (two) times daily. 180 tablet 3  . lamoTRIgine (LAMICTAL) 150 MG tablet Take 1 tablet (150 mg total) by mouth 2 (two) times daily. 180 tablet 3  . sitaGLIPtin (JANUVIA) 50 MG tablet Take 1 tablet (50 mg total) by mouth daily. 90 tablet 3  . torsemide (DEMADEX) 10 MG tablet Take 10 mg by mouth daily.     No current facility-administered  medications for this visit.    Neurologic: Headache: No Seizure: Yes Paresthesias:No  Musculoskeletal: Strength & Muscle Tone: within normal limits Gait & Station: normal Patient leans: Backward and N/A  Psychiatric Specialty Exam: ROS  There were no vitals taken for this visit.There is no height or weight on file to calculate BMI.  General Appearance: Casual  Eye Contact:  Fair  Speech:  Slurred  Volume:  Normal  Mood:  Dysphoric  Affect:  Appropriate  Thought Process:  Goal Directed  Orientation:  Full (Time, Place, and Person)  Thought Content:  Logical  Suicidal Thoughts:  No  Homicidal Thoughts:  No  Memory:  Negative  Judgement:  Fair  Insight:  Lacking  Psychomotor Activity:  Decreased  Concentration:    Recall:  [place] of Knowledge:Fair  Language: Fair  Akathisia:  No  Handed:    AIMS (if indicated):    Assets:  Desire for Improvement  ADL's:  Intact  Cognition: WNL  Sleep:      Treatment Plan Summary: 9/24/202111:04 AM   At this time the patient is diagnosed with a delusional disorder.  She does not seem to have to react to her delusions by leaving at this time.  Her weight gain is a significant problem.  At this time while I do not think Abilify is a major factor I am going to somewhat reduce her Abilify to a more appropriate lower dose of 5 mg.  I believe the patient also has a second problem of major depression.  We will start a low-dose of desipramine 25 mg in the morning.  Hopefully it will give her energy in some ways not to increase her appetite.  Fortunately the patient has an appointment with the nutritionist in the next week or 2.  This patient is seen again in 2-1/2 months.

## 2020-09-14 ENCOUNTER — Telehealth: Payer: Self-pay | Admitting: Family Medicine

## 2020-09-14 NOTE — Telephone Encounter (Signed)
Pt has called to report that another Dr has her on Desipramine 25mg , she wants to know if she can take this with her lamoTRIgine , please call

## 2020-09-14 NOTE — Telephone Encounter (Signed)
Spoke to pt  Relayed message from NP Pt voiced understanding, She will contact her other MD

## 2020-09-14 NOTE — Telephone Encounter (Signed)
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 She has been doing well on lamotrigine. There is a category C warning with those two medications. I would suggest she talk with provider that started desipramine. She has been doing well on lamotrigine from a seizure perspective so I would not want to stop or change that.

## 2020-09-14 NOTE — Telephone Encounter (Signed)
From the internet::  Using desipramine together with lamotrigine may increase side effects such as dizziness, drowsiness, confusion, and difficulty concentrating. Ok to take together??

## 2020-09-21 ENCOUNTER — Ambulatory Visit: Payer: Medicare HMO | Admitting: Registered"

## 2020-10-04 ENCOUNTER — Other Ambulatory Visit (HOSPITAL_COMMUNITY): Payer: Self-pay | Admitting: Psychiatry

## 2020-10-09 DIAGNOSIS — G4733 Obstructive sleep apnea (adult) (pediatric): Secondary | ICD-10-CM | POA: Diagnosis not present

## 2020-10-09 DIAGNOSIS — M545 Low back pain, unspecified: Secondary | ICD-10-CM | POA: Diagnosis not present

## 2020-10-09 DIAGNOSIS — R569 Unspecified convulsions: Secondary | ICD-10-CM | POA: Diagnosis not present

## 2020-10-24 ENCOUNTER — Ambulatory Visit: Payer: Medicare HMO | Admitting: Emergency Medicine

## 2020-10-27 ENCOUNTER — Other Ambulatory Visit: Payer: Self-pay

## 2020-10-27 ENCOUNTER — Encounter (HOSPITAL_COMMUNITY): Payer: Self-pay

## 2020-10-27 ENCOUNTER — Ambulatory Visit (HOSPITAL_COMMUNITY)
Admission: EM | Admit: 2020-10-27 | Discharge: 2020-10-27 | Disposition: A | Discharge status: Home / Self Care | Payer: Medicare HMO | Attending: Emergency Medicine | Admitting: Emergency Medicine

## 2020-10-27 DIAGNOSIS — Z8673 Personal history of transient ischemic attack (TIA), and cerebral infarction without residual deficits: Secondary | ICD-10-CM | POA: Insufficient documentation

## 2020-10-27 DIAGNOSIS — N183 Chronic kidney disease, stage 3 unspecified: Secondary | ICD-10-CM | POA: Diagnosis not present

## 2020-10-27 DIAGNOSIS — I5042 Chronic combined systolic (congestive) and diastolic (congestive) heart failure: Secondary | ICD-10-CM | POA: Diagnosis not present

## 2020-10-27 DIAGNOSIS — Z8249 Family history of ischemic heart disease and other diseases of the circulatory system: Secondary | ICD-10-CM | POA: Diagnosis not present

## 2020-10-27 DIAGNOSIS — R059 Cough, unspecified: Secondary | ICD-10-CM | POA: Insufficient documentation

## 2020-10-27 DIAGNOSIS — U071 COVID-19: Secondary | ICD-10-CM | POA: Diagnosis not present

## 2020-10-27 DIAGNOSIS — E1122 Type 2 diabetes mellitus with diabetic chronic kidney disease: Secondary | ICD-10-CM | POA: Diagnosis not present

## 2020-10-27 DIAGNOSIS — J069 Acute upper respiratory infection, unspecified: Secondary | ICD-10-CM | POA: Diagnosis not present

## 2020-10-27 DIAGNOSIS — I130 Hypertensive heart and chronic kidney disease with heart failure and stage 1 through stage 4 chronic kidney disease, or unspecified chronic kidney disease: Secondary | ICD-10-CM | POA: Diagnosis not present

## 2020-10-27 DIAGNOSIS — Z7984 Long term (current) use of oral hypoglycemic drugs: Secondary | ICD-10-CM | POA: Diagnosis not present

## 2020-10-27 LAB — RESP PANEL BY RT PCR (RSV, FLU A&B, COVID)
Influenza A by PCR: NEGATIVE
Influenza B by PCR: NEGATIVE
Respiratory Syncytial Virus by PCR: NEGATIVE
SARS Coronavirus 2 by RT PCR: POSITIVE — AB

## 2020-10-27 MED ORDER — AZITHROMYCIN 250 MG PO TABS: ORAL_TABLET | ORAL | 0 refills | Status: DC

## 2020-10-27 MED ORDER — IPRATROPIUM BROMIDE 0.06 % NA SOLN
2.0000 | Freq: Three times a day (TID) | NASAL | 12 refills | Status: DC | PRN
Start: 2020-10-27 — End: 2021-07-31

## 2020-10-27 MED ORDER — DM-GUAIFENESIN ER 30-600 MG PO TB12
1.0000 | ORAL_TABLET | Freq: Two times a day (BID) | ORAL | 0 refills | Status: DC
Start: 2020-10-27 — End: 2020-11-04

## 2020-10-27 NOTE — ED Provider Notes (Signed)
[place] [facility]    CSN: [id number] Arrival date & time: 10/27/20  1315      History   Chief Complaint Chief Complaint  Patient presents with   Cough   Fatigue    HPI Brandi Gomez is a 49 y.o. female.   Brandi Gomez presents with complaints of  A few days of cough and congestion. No known fevers. Nasal drainage. No sore throat or ear pain. No specific shortness of breath. No gi symptoms. Cough is productive sometimes. Has taken nyquil which has minimally helped. She was around a friend who had a "cold."  Denies history of asthma or copd, doesn't smoke. History of seizures, stroke, diabetes, htn, anxiety, obesity.     ROS per HPI, negative if not otherwise mentioned.      Past Medical History:  Diagnosis Date   Anemia    Anxiety    Bipolar 1 disorder ([place])    Common migraine 05/19/2015   Depression    Diabetes mellitus, type II ([place])    Hypertension    Irritable bowel syndrome (IBS)    Mild mental retardation    Obesity    Partial complex seizure disorder with intractable epilepsy ([place]) 05/12/2014   Seizures ([place])    intractable, sz 08/23/17   Sleep apnea    Stroke ([place])    Type II or unspecified type diabetes mellitus without mention of complication, not stated as uncontrolled     Patient Active Problem List   Diagnosis Date Noted   Morbid (severe) obesity due to excess calories ([place]) 04/18/2020   HLD (hyperlipidemia) 10/12/2019   CKD (chronic kidney disease) stage 3, GFR 30-59 ml/min (H[facility]) 10/12/2019   Chronic combined systolic and diastolic heart failure ([place])    Depression 07/14/2019   Bipolar disorder ([place]) 05/13/2019   Bipolar 1 disorder ([place]) 05/13/2019   Adjustment disorder with anxiety    History of recent stroke 08/06/2017   OSA (obstructive sleep apnea)    Controlled type 2 diabetes mellitus without complication, without long-term current use of insulin ([place]) 05/08/2017   Hypertension associated with  diabetes ([place]) 07/27/2008   Seizure disorder ([place]) 02/05/2007    Past Surgical History:  Procedure Laterality Date   BUBBLE STUDY  11/15/2019   Procedure: BUBBLE STUDY;  Surgeon: Dorothy Spark, MD;  Location: [facility];  Service: Cardiovascular;;   COLONOSCOPY     2012-normal , Dr Sharlett Iles   ESOPHAGOGASTRODUODENOSCOPY     normal-Dr Patterson 2012   [place] N/A 05/30/2017   Procedure: Loop Recorder Insertion;  Surgeon: Constance Haw, MD;  Location: [facility];  Service: Cardiovascular;  Laterality: N/A;   LOOP RECORDER REMOVAL N/A 03/04/2018   Procedure: LOOP RECORDER REMOVAL;  Surgeon: Constance Haw, MD;  Location: [facility];  Service: Cardiovascular;  Laterality: N/A;   MYRINGOTOMY WITH TUBE PLACEMENT     MYRINGOTOMY WITH TUBE PLACEMENT Right 11/05/2017   Procedure: MYRINGOTOMY WITH TUBE PLACEMENT;  Surgeon: Melissa Montane, MD;  Location: [facility];  Service: ENT;  Laterality: Right;  right T Tube placement   NASAL SINUS SURGERY     TEE WITHOUT CARDIOVERSION N/A 05/30/2017   Procedure: TRANSESOPHAGEAL ECHOCARDIOGRAM (TEE);  Surgeon: Acie Fredrickson Wonda Cheng, MD;  Location: [facility];  Service: Cardiovascular;  Laterality: N/A;   TEE WITHOUT CARDIOVERSION N/A 11/15/2019   Procedure: TRANSESOPHAGEAL ECHOCARDIOGRAM (TEE);  Surgeon: Dorothy Spark, MD;  Location: [facility];  Service: Cardiovascular;  Laterality: N/A;    OB History  No obstetric history on file.      Home Medications    Prior to Admission medications   Medication Sig Start Date End Date Taking? Authorizing Provider  Acetaminophen (TYLENOL ARTHRITIS PAIN PO) Take by mouth as needed.    [provider]  acetaminophen (TYLENOL) 325 MG tablet Take 2 tablets (650 mg total) by mouth every 4 (four) hours as needed for mild pain (or temp > 37.5 C (99.5 F)). Patient not taking: Reported on 08/29/2020 10/19/19   Cathlyn Parsons, PA-C  ARIPiprazole (ABILIFY)  5 MG tablet 1 qam 09/01/20   Plovsky, Berneta Sages, MD  atorvastatin (LIPITOR) 40 MG tablet Take 1 tablet (40 mg total) by mouth daily at 6 PM. 01/04/20 05/02/20  Sagardia, Ines Bloomer, MD  azithromycin (ZITHROMAX) 250 MG tablet Take 2 tablets (500 mg total) by mouth daily for 1 day, THEN 1 tablet (250 mg total) daily for 4 days. 10/27/20 11/01/20  Zigmund Gottron, NP  carvedilol (COREG) 6.25 MG tablet Take 1 tablet (6.25 mg total) by mouth 2 (two) times daily with a meal. 08/29/20 11/27/20  Horald Pollen, MD  desipramine (NORPRAMIN) 25 MG tablet 1  qam 09/01/20   Plovsky, Berneta Sages, MD  dextromethorphan-guaiFENesin [facility] DM) 30-600 MG 12hr tablet Take 1 tablet by mouth 2 (two) times daily for 5 days. 10/27/20 11/01/20  Zigmund Gottron, NP  glipiZIDE (GLUCOTROL) 5 MG tablet Take 1 tablet (5 mg total) by mouth 2 (two) times daily with a meal. 08/29/20 11/27/20  Sagardia, Ines Bloomer, MD  hydrALAZINE (APRESOLINE) 10 MG tablet Take 1 tablet (10 mg total) by mouth 2 (two) times daily. 01/04/20 05/02/20  Horald Pollen, MD  ipratropium (ATROVENT) 0.06 % nasal spray Place 2 sprays into both nostrils 3 (three) times daily as needed for rhinitis. 10/27/20   Zigmund Gottron, NP  isosorbide dinitrate (ISORDIL) 10 MG tablet Take 1 tablet (10 mg total) by mouth 2 (two) times daily. 01/04/20 05/02/20  Horald Pollen, MD  lamoTRIgine (LAMICTAL) 150 MG tablet Take 1 tablet (150 mg total) by mouth 2 (two) times daily. 05/02/20   [name], MD  sitaGLIPtin (JANUVIA) 50 MG tablet Take 1 tablet (50 mg total) by mouth daily. 08/29/20 11/27/20  Horald Pollen, MD  torsemide (DEMADEX) 10 MG tablet Take 10 mg by mouth daily.    [provider]  methylphenidate (RITALIN) 5 MG tablet Take 1 tablet (5 mg total) by mouth 2 (two) times daily. Patient not taking: Reported on 12/29/2019 10/29/19 01/01/20  Izora Ribas, MD  traZODone (DESYREL) 100 MG tablet Take 1 tablet (100 mg total) by mouth at  bedtime. Patient not taking: Reported on 12/29/2019 10/19/19 01/01/20  Angiulli, Lavon Paganini, PA-C    Family History Family History  Problem Relation Age of Onset   Diabetes Mother        passed away from accidental death   Hypertension Mother    Bipolar disorder Father    Diabetes Daughter    Leukemia Daughter    Ovarian cancer Maternal Aunt     Social History Social History   Tobacco Use   Smoking status: Never Smoker   Smokeless tobacco: Never Used  Scientific laboratory technician Use: Never used  Substance Use Topics   Alcohol use: No   Drug use: No     Allergies   Amoxicillin, Lisinopril, Hydrocodone, and Tegretol [carbamazepine]   Review of Systems Review of Systems   Physical Exam Triage Vital Signs ED  Triage Vitals  Enc Vitals Group     BP 10/27/20 1331 124/74     Pulse Rate 10/27/20 1331 (!) 104     Resp 10/27/20 1331 20     Temp 10/27/20 1331 99.4 F (37.4 C)     Temp Source 10/27/20 1331 Oral     SpO2 10/27/20 1331 94 %     Weight --      Height --      Head Circumference --      Peak Flow --      Pain Score 10/27/20 1329 0     Pain Loc --      Pain Edu? --      Excl. in [place]? --    No data found.  Updated Vital Signs BP 124/74 (BP Location: Right Arm)    Pulse (!) 104    Temp 99.4 F (37.4 C) (Oral)    Resp 20    LMP 10/16/2020 (Approximate)    SpO2 94%    Physical Exam Constitutional:      General: She is not in acute distress.    Appearance: She is well-developed. She is obese.  HENT:     Nose: Rhinorrhea present.  Cardiovascular:     Rate and Rhythm: Normal rate.  Pulmonary:     Effort: Pulmonary effort is normal. No respiratory distress.     Breath sounds: Normal breath sounds.     Comments: Strong congested cough noted; mild tachypnea noted  Skin:    General: Skin is warm and dry.  Neurological:     Mental Status: She is alert and oriented to person, place, and time.      UC Treatments / Results  Labs (all labs ordered  are listed, but only abnormal results are displayed) Labs Reviewed  RESP PANEL BY RT PCR (RSV, FLU A&B, COVID)    EKG   Radiology No results found.  Procedures Procedures (including critical care time)  Medications Ordered in UC Medications - No data to display  Initial Impression / Assessment and Plan / UC Course  I have reviewed the triage vital signs and the nursing notes.  Pertinent labs & imaging results that were available during my care of the patient were reviewed by me and considered in my medical decision making (see chart for details).     Lungs are grossly clear but significant congested cough noted and mild tachypnea even at rest, with elevation in HR and O2 at 94% which is lower than baseline for her. Covid testing pending and isolation instructions provided.  Opted to provided azithromycin here today for any atypical infection. Afebrile. Return precautions provided. Patient verbalized understanding and agreeable to plan.    Final Clinical Impressions(s) / UC Diagnoses   Final diagnoses:  Upper respiratory tract infection, unspecified type  Cough     Discharge Instructions     I have sent azithromycin which you will complete over the next 5 days.  Push fluids to ensure adequate hydration and keep secretions thin.  Tylenol as needed for pain.  Mucinex d as an expectorant.  Nasal spray to help with your nasal drainage and congestion.  Self isolate until covid results are back and negative.  Will notify you by phone of any positive findings. Your negative results will be sent through your MyChart.     If symptoms worsen or do not improve in the next week to return to be seen or to follow up with your PCP.  ED Prescriptions    Medication Sig Dispense Auth. Provider   azithromycin (ZITHROMAX) 250 MG tablet Take 2 tablets (500 mg total) by mouth daily for 1 day, THEN 1 tablet (250 mg total) daily for 4 days. 6 tablet Augusto Gamble B, NP    dextromethorphan-guaiFENesin (MUCINEX DM) 30-600 MG 12hr tablet Take 1 tablet by mouth 2 (two) times daily for 5 days. 10 tablet Augusto Gamble B, NP   ipratropium (ATROVENT) 0.06 % nasal spray Place 2 sprays into both nostrils 3 (three) times daily as needed for rhinitis. 15 mL Zigmund Gottron, NP     PDMP not reviewed this encounter.   Zigmund Gottron, NP 10/27/20 1513

## 2020-10-27 NOTE — ED Triage Notes (Signed)
Pt in with c/o productive cough that started yesterday.  States she took nyquil with no relief  Denies runny nose, N/v, diarrhea, ST or other uri sxs

## 2020-10-27 NOTE — Discharge Instructions (Signed)
I have sent azithromycin which you will complete over the next 5 days.  Push fluids to ensure adequate hydration and keep secretions thin.  Tylenol as needed for pain.  Mucinex d as an expectorant.  Nasal spray to help with your nasal drainage and congestion.  Self isolate until covid results are back and negative.  Will notify you by phone of any positive findings. Your negative results will be sent through your MyChart.     If symptoms worsen or do not improve in the next week to return to be seen or to follow up with your PCP.

## 2020-10-28 ENCOUNTER — Other Ambulatory Visit: Payer: Self-pay | Admitting: Nurse Practitioner

## 2020-10-28 ENCOUNTER — Ambulatory Visit (HOSPITAL_COMMUNITY)
Admission: RE | Admit: 2020-10-28 | Discharge: 2020-10-28 | Disposition: A | Discharge status: Home / Self Care | Payer: Medicare HMO | Source: Ambulatory Visit | Attending: Pulmonary Disease | Admitting: Pulmonary Disease

## 2020-10-28 DIAGNOSIS — Z885 Allergy status to narcotic agent status: Secondary | ICD-10-CM | POA: Diagnosis not present

## 2020-10-28 DIAGNOSIS — G40209 Localization-related (focal) (partial) symptomatic epilepsy and epileptic syndromes with complex partial seizures, not intractable, without status epilepticus: Secondary | ICD-10-CM | POA: Diagnosis present

## 2020-10-28 DIAGNOSIS — Z880 Allergy status to penicillin: Secondary | ICD-10-CM | POA: Diagnosis not present

## 2020-10-28 DIAGNOSIS — U071 COVID-19: Secondary | ICD-10-CM

## 2020-10-28 DIAGNOSIS — I10 Essential (primary) hypertension: Secondary | ICD-10-CM | POA: Diagnosis present

## 2020-10-28 DIAGNOSIS — Z888 Allergy status to other drugs, medicaments and biological substances status: Secondary | ICD-10-CM | POA: Diagnosis not present

## 2020-10-28 DIAGNOSIS — J1282 Pneumonia due to coronavirus disease 2019: Secondary | ICD-10-CM | POA: Diagnosis present

## 2020-10-28 DIAGNOSIS — R569 Unspecified convulsions: Secondary | ICD-10-CM | POA: Diagnosis not present

## 2020-10-28 DIAGNOSIS — J9601 Acute respiratory failure with hypoxia: Secondary | ICD-10-CM | POA: Diagnosis present

## 2020-10-28 DIAGNOSIS — R0602 Shortness of breath: Secondary | ICD-10-CM | POA: Diagnosis not present

## 2020-10-28 DIAGNOSIS — M545 Low back pain, unspecified: Secondary | ICD-10-CM | POA: Diagnosis not present

## 2020-10-28 DIAGNOSIS — Z79899 Other long term (current) drug therapy: Secondary | ICD-10-CM | POA: Diagnosis not present

## 2020-10-28 DIAGNOSIS — I959 Hypotension, unspecified: Secondary | ICD-10-CM | POA: Diagnosis not present

## 2020-10-28 DIAGNOSIS — Z7984 Long term (current) use of oral hypoglycemic drugs: Secondary | ICD-10-CM | POA: Diagnosis not present

## 2020-10-28 DIAGNOSIS — Z23 Encounter for immunization: Secondary | ICD-10-CM | POA: Diagnosis not present

## 2020-10-28 DIAGNOSIS — G4733 Obstructive sleep apnea (adult) (pediatric): Secondary | ICD-10-CM | POA: Diagnosis not present

## 2020-10-28 DIAGNOSIS — E119 Type 2 diabetes mellitus without complications: Secondary | ICD-10-CM | POA: Diagnosis present

## 2020-10-28 DIAGNOSIS — E785 Hyperlipidemia, unspecified: Secondary | ICD-10-CM | POA: Diagnosis present

## 2020-10-28 DIAGNOSIS — R0902 Hypoxemia: Secondary | ICD-10-CM | POA: Diagnosis not present

## 2020-10-28 MED ORDER — ACETAMINOPHEN 325 MG PO TABS
650.0000 mg | ORAL_TABLET | Freq: Four times a day (QID) | ORAL | Status: DC | PRN
  Administered 2020-10-28: 650 mg via ORAL
  Filled 2020-10-28: qty 2

## 2020-10-28 MED ORDER — METHYLPREDNISOLONE SODIUM SUCC 125 MG IJ SOLR: 125.0000 mg | Freq: Once | INTRAMUSCULAR | Status: DC | PRN

## 2020-10-28 MED ORDER — EPINEPHRINE 0.3 MG/0.3ML IJ SOAJ: 0.3000 mg | Freq: Once | INTRAMUSCULAR | Status: DC | PRN

## 2020-10-28 MED ORDER — SOTROVIMAB 500 MG/8ML IV SOLN
500.0000 mg | Freq: Once | INTRAVENOUS | Status: AC
  Administered 2020-10-28: 500 mg via INTRAVENOUS

## 2020-10-28 MED ORDER — FAMOTIDINE IN NACL 20-0.9 MG/50ML-% IV SOLN: 20.0000 mg | Freq: Once | INTRAVENOUS | Status: DC | PRN

## 2020-10-28 MED ORDER — ALBUTEROL SULFATE HFA 108 (90 BASE) MCG/ACT IN AERS: 2.0000 | INHALATION_SPRAY | Freq: Once | RESPIRATORY_TRACT | Status: DC | PRN

## 2020-10-28 MED ORDER — DIPHENHYDRAMINE HCL 50 MG/ML IJ SOLN: 50.0000 mg | Freq: Once | INTRAMUSCULAR | Status: DC | PRN

## 2020-10-28 MED ORDER — SODIUM CHLORIDE 0.9 % IV SOLN: INTRAVENOUS | Status: DC | PRN

## 2020-10-28 NOTE — Progress Notes (Signed)
  Diagnosis: COVID-19  Physician: Dr. Asencion Noble  Procedure:  Medication fact sheet provided to patient; all questions answered.  Allergies reviewed with patient.  IV placed.  Sotrovimab administered via IV infusion.   Complications: No immediate complications noted.  Discharge: Discharged home   Monna Fam 10/28/2020

## 2020-10-28 NOTE — Discharge Instructions (Signed)
10 Things You Can Do to Manage Your COVID-19 Symptoms at Home If you have possible or confirmed COVID-19: 1. Stay home from work and school. And stay away from other public places. If you must go out, avoid using any kind of public transportation, ridesharing, or taxis. 2. Monitor your symptoms carefully. If your symptoms get worse, call your healthcare provider immediately. 3. Get rest and stay hydrated. 4. If you have a medical appointment, call the healthcare provider ahead of time and tell them that you have or may have COVID-19. 5. For medical emergencies, call 911 and notify the dispatch personnel that you have or may have COVID-19. 6. Cover your cough and sneezes with a tissue or use the inside of your elbow. 7. Wash your hands often with soap and water for at least 20 seconds or clean your hands with an alcohol-based hand sanitizer that contains at least 60% alcohol. 8. As much as possible, stay in a specific room and away from other people in your home. Also, you should use a separate bathroom, if available. If you need to be around other people in or outside of the home, wear a mask. 9. Avoid sharing personal items with other people in your household, like dishes, towels, and bedding. 10. Clean all surfaces that are touched often, like counters, tabletops, and doorknobs. Use household cleaning sprays or wipes according to the label instructions. [email or web address] 06/09/2019 This information is not intended to replace advice given to you by your health care provider. Make sure you discuss any questions you have with your health care provider. Document Revised: 11/11/2019 Document Reviewed: 11/11/2019 Elsevier Patient Education  [place].  What types of side effects do monoclonal antibody drugs cause?  Common side effects  In general, the more common side effects caused by monoclonal antibody drugs include:  Allergic reactions, such as hives or itching  Flu-like signs and  symptoms, including chills, fatigue, fever, and muscle aches and pains  Nausea, vomiting  Diarrhea  Skin rashes  Low blood pressure   The CDC is recommending patients who receive monoclonal antibody treatments wait at least 90 days before being vaccinated.  Currently, there are no data on the safety and efficacy of mRNA COVID-19 vaccines in persons who received monoclonal antibodies or convalescent plasma as part of COVID-19 treatment. Based on the estimated half-life of such therapies as well as evidence suggesting that reinfection is uncommon in the 90 days after initial infection, vaccination should be deferred for at least 90 days, as a precautionary measure until additional information becomes available, to avoid interference of the antibody treatment with vaccine-induced immune responses.  What types of side effects do monoclonal antibody drugs cause?  Common side effects  In general, the more common side effects caused by monoclonal antibody drugs include:  Allergic reactions, such as hives or itching  Flu-like signs and symptoms, including chills, fatigue, fever, and muscle aches and pains  Nausea, vomiting  Diarrhea  Skin rashes  Low blood pressure   The CDC is recommending patients who receive monoclonal antibody treatments wait at least 90 days before being vaccinated.  If you have any questions or concerns after the infusion please call the Advanced Practice Provider on call at [phone]

## 2020-10-28 NOTE — Progress Notes (Signed)
Patient reviewed Fact Sheet for Patients, Parents, and Caregivers for Emergency Use Authorization (EUA) of Sotrovimab for the Treatment of Coronavirus. Patient also reviewed and is agreeable to the estimated cost of treatment. Patient is agreeable to proceed.   

## 2020-10-28 NOTE — Progress Notes (Signed)
I connected by phone with Brandi Gomez on 10/28/2020 at 9:42 AM to discuss the potential use of a treatment for mild to moderate COVID-19 viral infection in non-hospitalized patients.  This patient is a 49 y.o. female that meets the FDA criteria for Emergency Use Authorization of bamlanivimab/etesevimab, casirivimab\imdevimab, or sotrovimab  Has a (+) direct SARS-CoV-2 viral test result  Has mild or moderate COVID-19   Is ? 49 years of age and weighs ? 40 kg  Is NOT hospitalized due to COVID-19  Is NOT requiring oxygen therapy or requiring an increase in baseline oxygen flow rate due to COVID-19  Is within 10 days of symptom onset  Has at least one of the high risk factor(s) for progression to severe COVID-19 and/or hospitalization as defined in EUA.  Specific high risk criteria : BMI > 25, Chronic Kidney Disease (CKD), Diabetes, Cardiovascular disease or hypertension and Other high risk medical condition per CDC:  SVI   I have spoken and communicated the following to the patient or parent/caregiver:  1. FDA has authorized the emergency use of bamlanivimab/etesevimab, casirivimab\imdevimab, or sotrovimab for the treatment of mild to moderate COVID-19 in adults and pediatric patients with positive results of direct SARS-CoV-2 viral testing who are 83 years of age and older weighing at least 40 kg, and who are at high risk for progressing to severe COVID-19 and/or hospitalization.  2. The significant known and potential risks and benefits of bamlanivimab/etesevimab, casirivimab\imdevimab, or sotrovimab, and the extent to which such potential risks and benefits are unknown.  3. Information on available alternative treatments and the risks and benefits of those alternatives, including clinical trials.  4. Patients treated with bamlanivimab/etesevimab, casirivimab\imdevimab, or sotrovimab should continue to self-isolate and use infection control measures (e.g., wear mask, isolate,  social distance, avoid sharing personal items, clean and disinfect "high touch" surfaces, and frequent handwashing) according to CDC guidelines.   5. The patient or parent/caregiver has the option to accept or refuse bamlanivimab/etesevimab, casirivimab\imdevimab, or sotrovimab.  After reviewing this information with the patient, the patient has agreed to receive one of the available covid 19 monoclonal antibodies and will be provided an appropriate fact sheet prior to infusion.Beckey Rutter, [place], AGNP-C [phone] ([place])

## 2020-10-31 ENCOUNTER — Other Ambulatory Visit: Payer: Self-pay

## 2020-10-31 ENCOUNTER — Emergency Department (HOSPITAL_COMMUNITY): Payer: Medicare HMO

## 2020-10-31 ENCOUNTER — Encounter (HOSPITAL_COMMUNITY): Payer: Self-pay | Admitting: Student

## 2020-10-31 ENCOUNTER — Inpatient Hospital Stay (HOSPITAL_COMMUNITY)
Admission: EM | Admit: 2020-10-31 | Discharge: 2020-11-04 | DRG: 177 | Disposition: A | Discharge status: Home / Self Care | Payer: Medicare HMO | Attending: Internal Medicine | Admitting: Internal Medicine

## 2020-10-31 DIAGNOSIS — Z23 Encounter for immunization: Secondary | ICD-10-CM

## 2020-10-31 DIAGNOSIS — E785 Hyperlipidemia, unspecified: Secondary | ICD-10-CM | POA: Diagnosis present

## 2020-10-31 DIAGNOSIS — Z880 Allergy status to penicillin: Secondary | ICD-10-CM | POA: Diagnosis not present

## 2020-10-31 DIAGNOSIS — Z7984 Long term (current) use of oral hypoglycemic drugs: Secondary | ICD-10-CM | POA: Diagnosis not present

## 2020-10-31 DIAGNOSIS — G40209 Localization-related (focal) (partial) symptomatic epilepsy and epileptic syndromes with complex partial seizures, not intractable, without status epilepticus: Secondary | ICD-10-CM | POA: Diagnosis present

## 2020-10-31 DIAGNOSIS — Z79899 Other long term (current) drug therapy: Secondary | ICD-10-CM | POA: Diagnosis not present

## 2020-10-31 DIAGNOSIS — J9601 Acute respiratory failure with hypoxia: Secondary | ICD-10-CM | POA: Diagnosis present

## 2020-10-31 DIAGNOSIS — I10 Essential (primary) hypertension: Secondary | ICD-10-CM | POA: Diagnosis present

## 2020-10-31 DIAGNOSIS — J1282 Pneumonia due to coronavirus disease 2019: Secondary | ICD-10-CM | POA: Diagnosis present

## 2020-10-31 DIAGNOSIS — E119 Type 2 diabetes mellitus without complications: Secondary | ICD-10-CM | POA: Diagnosis present

## 2020-10-31 DIAGNOSIS — U071 COVID-19: Secondary | ICD-10-CM | POA: Diagnosis present

## 2020-10-31 DIAGNOSIS — Z885 Allergy status to narcotic agent status: Secondary | ICD-10-CM | POA: Diagnosis not present

## 2020-10-31 DIAGNOSIS — M545 Low back pain, unspecified: Secondary | ICD-10-CM | POA: Diagnosis not present

## 2020-10-31 DIAGNOSIS — R569 Unspecified convulsions: Secondary | ICD-10-CM | POA: Diagnosis not present

## 2020-10-31 DIAGNOSIS — G4733 Obstructive sleep apnea (adult) (pediatric): Secondary | ICD-10-CM | POA: Diagnosis not present

## 2020-10-31 DIAGNOSIS — Z888 Allergy status to other drugs, medicaments and biological substances status: Secondary | ICD-10-CM

## 2020-10-31 DIAGNOSIS — R0902 Hypoxemia: Secondary | ICD-10-CM | POA: Diagnosis not present

## 2020-10-31 DIAGNOSIS — I959 Hypotension, unspecified: Secondary | ICD-10-CM | POA: Diagnosis not present

## 2020-10-31 DIAGNOSIS — R0602 Shortness of breath: Secondary | ICD-10-CM | POA: Diagnosis not present

## 2020-10-31 LAB — COMPREHENSIVE METABOLIC PANEL
ALT: 32 U/L (ref 0–44)
AST: 77 U/L — ABNORMAL HIGH (ref 15–41)
Albumin: 3.5 g/dL (ref 3.5–5.0)
Alkaline Phosphatase: 63 U/L (ref 38–126)
Anion gap: 9 (ref 5–15)
BUN: 15 mg/dL (ref 6–20)
CO2: 27 mmol/L (ref 22–32)
Calcium: 8.2 mg/dL — ABNORMAL LOW (ref 8.9–10.3)
Chloride: 103 mmol/L (ref 98–111)
Creatinine, Ser: 1.58 mg/dL — ABNORMAL HIGH (ref 0.44–1.00)
GFR, Estimated: 39 mL/min — ABNORMAL LOW (ref >60)
Glucose, Bld: 165 mg/dL — ABNORMAL HIGH (ref 70–99)
Potassium: 4 mmol/L (ref 3.5–5.1)
Sodium: 139 mmol/L (ref 135–145)
Total Bilirubin: 0.6 mg/dL (ref 0.3–1.2)
Total Protein: 8.1 g/dL (ref 6.5–8.1)

## 2020-10-31 LAB — CBC WITH DIFFERENTIAL/PLATELET
Abs Immature Granulocytes: 0.02 10*3/uL (ref 0.00–0.07)
Basophils Absolute: 0 10*3/uL (ref 0.0–0.1)
Basophils Relative: 0 %
Eosinophils Absolute: 0 10*3/uL (ref 0.0–0.5)
Eosinophils Relative: 0 %
HCT: 31.8 % — ABNORMAL LOW (ref 36.0–46.0)
Hemoglobin: 9.6 g/dL — ABNORMAL LOW (ref 12.0–15.0)
Immature Granulocytes: 0 %
Lymphocytes Relative: 19 %
Lymphs Abs: 0.9 10*3/uL (ref 0.7–4.0)
MCH: 26.7 pg (ref 26.0–34.0)
MCHC: 30.2 g/dL (ref 30.0–36.0)
MCV: 88.3 fL (ref 80.0–100.0)
Monocytes Absolute: 0.3 10*3/uL (ref 0.1–1.0)
Monocytes Relative: 6 %
Neutro Abs: 3.6 10*3/uL (ref 1.7–7.7)
Neutrophils Relative %: 75 %
Platelets: 238 10*3/uL (ref 150–400)
RBC: 3.6 MIL/uL — ABNORMAL LOW (ref 3.87–5.11)
RDW: 16.8 % — ABNORMAL HIGH (ref 11.5–15.5)
WBC: 4.9 10*3/uL (ref 4.0–10.5)
nRBC: 0 % (ref 0.0–0.2)

## 2020-10-31 LAB — PROCALCITONIN: Procalcitonin: <0.1 ng/mL

## 2020-10-31 LAB — HEMOGLOBIN A1C
Hgb A1c MFr Bld: 7.9 % — ABNORMAL HIGH (ref 4.8–5.6)
Mean Plasma Glucose: 180.03 mg/dL

## 2020-10-31 LAB — FERRITIN: Ferritin: 71 ng/mL (ref 11–307)

## 2020-10-31 LAB — TRIGLYCERIDES: Triglycerides: 100 mg/dL (ref <150)

## 2020-10-31 LAB — IRON AND TIBC
Iron: 23 ug/dL — ABNORMAL LOW (ref 28–170)
Saturation Ratios: 7 % — ABNORMAL LOW (ref 10.4–31.8)
TIBC: 315 ug/dL (ref 250–450)
UIBC: 292 ug/dL

## 2020-10-31 LAB — GLUCOSE, CAPILLARY
Glucose-Capillary: 190 mg/dL — ABNORMAL HIGH (ref 70–99)
Glucose-Capillary: 216 mg/dL — ABNORMAL HIGH (ref 70–99)

## 2020-10-31 LAB — I-STAT BETA HCG BLOOD, ED (MC, WL, AP ONLY): I-stat hCG, quantitative: <5 m[IU]/mL (ref <5)

## 2020-10-31 LAB — C-REACTIVE PROTEIN: CRP: 10.1 mg/dL — ABNORMAL HIGH (ref <1.0)

## 2020-10-31 LAB — HIV ANTIBODY (ROUTINE TESTING W REFLEX): HIV Screen 4th Generation wRfx: NONREACTIVE

## 2020-10-31 LAB — LACTATE DEHYDROGENASE: LDH: 360 U/L — ABNORMAL HIGH (ref 98–192)

## 2020-10-31 LAB — LACTIC ACID, PLASMA: Lactic Acid, Venous: 1.1 mmol/L (ref 0.5–1.9)

## 2020-10-31 LAB — D-DIMER, QUANTITATIVE: D-Dimer, Quant: 1.36 ug/mL-FEU — ABNORMAL HIGH (ref 0.00–0.50)

## 2020-10-31 LAB — FIBRINOGEN: Fibrinogen: 552 mg/dL — ABNORMAL HIGH (ref 210–475)

## 2020-10-31 MED ORDER — ONDANSETRON HCL 4 MG PO TABS: 4.0000 mg | ORAL_TABLET | Freq: Four times a day (QID) | ORAL | Status: DC | PRN

## 2020-10-31 MED ORDER — DESIPRAMINE HCL 25 MG PO TABS
25.0000 mg | ORAL_TABLET | Freq: Every day | ORAL | Status: DC
  Administered 2020-10-31 – 2020-11-03 (×4): 25 mg via ORAL
  Filled 2020-10-31 (×5): qty 1

## 2020-10-31 MED ORDER — INSULIN ASPART 100 UNIT/ML ~~LOC~~ SOLN
0.0000 [IU] | Freq: Three times a day (TID) | SUBCUTANEOUS | Status: DC
  Administered 2020-10-31: 3 [IU] via SUBCUTANEOUS
  Administered 2020-11-01: 8 [IU] via SUBCUTANEOUS
  Administered 2020-11-01 – 2020-11-02 (×3): 3 [IU] via SUBCUTANEOUS
  Administered 2020-11-02: 8 [IU] via SUBCUTANEOUS
  Administered 2020-11-02: 15 [IU] via SUBCUTANEOUS
  Administered 2020-11-03: 2 [IU] via SUBCUTANEOUS
  Administered 2020-11-03: 15 [IU] via SUBCUTANEOUS
  Administered 2020-11-03: 3 [IU] via SUBCUTANEOUS
  Administered 2020-11-04: 5 [IU] via SUBCUTANEOUS
  Administered 2020-11-04: 3 [IU] via SUBCUTANEOUS

## 2020-10-31 MED ORDER — ACETAMINOPHEN 325 MG PO TABS
650.0000 mg | ORAL_TABLET | Freq: Four times a day (QID) | ORAL | Status: DC | PRN
  Administered 2020-10-31 – 2020-11-01 (×2): 650 mg via ORAL
  Filled 2020-10-31 (×2): qty 2

## 2020-10-31 MED ORDER — ENOXAPARIN SODIUM 60 MG/0.6ML ~~LOC~~ SOLN
50.0000 mg | SUBCUTANEOUS | Status: DC
  Administered 2020-10-31 – 2020-11-03 (×4): 50 mg via SUBCUTANEOUS
  Filled 2020-10-31 (×4): qty 0.6

## 2020-10-31 MED ORDER — ONDANSETRON HCL 4 MG/2ML IJ SOLN
4.0000 mg | Freq: Four times a day (QID) | INTRAMUSCULAR | Status: DC | PRN
  Filled 2020-10-31: qty 2

## 2020-10-31 MED ORDER — CARVEDILOL 3.125 MG PO TABS
3.1250 mg | ORAL_TABLET | Freq: Two times a day (BID) | ORAL | Status: DC
  Administered 2020-11-01 – 2020-11-04 (×7): 3.125 mg via ORAL
  Filled 2020-10-31 (×7): qty 1

## 2020-10-31 MED ORDER — ARIPIPRAZOLE 10 MG PO TABS
5.0000 mg | ORAL_TABLET | Freq: Every day | ORAL | Status: DC
  Administered 2020-11-01 – 2020-11-04 (×4): 5 mg via ORAL
  Filled 2020-10-31 (×5): qty 1

## 2020-10-31 MED ORDER — SODIUM CHLORIDE 0.9 % IV SOLN
200.0000 mg | Freq: Once | INTRAVENOUS | Status: AC
  Administered 2020-10-31: 200 mg via INTRAVENOUS
  Filled 2020-10-31: qty 40

## 2020-10-31 MED ORDER — SODIUM CHLORIDE 0.9 % IV SOLN: Freq: Once | INTRAVENOUS | Status: AC

## 2020-10-31 MED ORDER — ATORVASTATIN CALCIUM 40 MG PO TABS
40.0000 mg | ORAL_TABLET | Freq: Every day | ORAL | Status: DC
  Administered 2020-10-31 – 2020-11-03 (×4): 40 mg via ORAL
  Filled 2020-10-31 (×4): qty 1

## 2020-10-31 MED ORDER — IPRATROPIUM-ALBUTEROL 20-100 MCG/ACT IN AERS
1.0000 | INHALATION_SPRAY | Freq: Four times a day (QID) | RESPIRATORY_TRACT | Status: DC
  Administered 2020-10-31 – 2020-11-02 (×7): 1 via RESPIRATORY_TRACT
  Filled 2020-10-31 (×4): qty 4

## 2020-10-31 MED ORDER — DEXAMETHASONE SODIUM PHOSPHATE 10 MG/ML IJ SOLN
6.0000 mg | INTRAMUSCULAR | Status: DC
  Administered 2020-10-31 – 2020-11-04 (×5): 6 mg via INTRAVENOUS
  Filled 2020-10-31 (×5): qty 1

## 2020-10-31 MED ORDER — ZINC SULFATE 220 (50 ZN) MG PO CAPS
220.0000 mg | ORAL_CAPSULE | Freq: Every day | ORAL | Status: DC
  Administered 2020-10-31 – 2020-11-04 (×5): 220 mg via ORAL
  Filled 2020-10-31 (×5): qty 1

## 2020-10-31 MED ORDER — GUAIFENESIN-DM 100-10 MG/5ML PO SYRP
10.0000 mL | ORAL_SOLUTION | ORAL | Status: DC | PRN
  Administered 2020-10-31: 10 mL via ORAL
  Filled 2020-10-31 (×2): qty 10

## 2020-10-31 MED ORDER — LAMOTRIGINE 100 MG PO TABS
100.0000 mg | ORAL_TABLET | Freq: Two times a day (BID) | ORAL | Status: DC
  Administered 2020-10-31 – 2020-11-04 (×8): 100 mg via ORAL
  Filled 2020-10-31 (×8): qty 1

## 2020-10-31 MED ORDER — INSULIN ASPART 100 UNIT/ML ~~LOC~~ SOLN
0.0000 [IU] | Freq: Every day | SUBCUTANEOUS | Status: DC
  Administered 2020-10-31: 2 [IU] via SUBCUTANEOUS
  Administered 2020-11-01 – 2020-11-02 (×2): 3 [IU] via SUBCUTANEOUS
  Administered 2020-11-03: 2 [IU] via SUBCUTANEOUS

## 2020-10-31 MED ORDER — SODIUM CHLORIDE 0.9 % IV SOLN
100.0000 mg | Freq: Every day | INTRAVENOUS | Status: AC
  Administered 2020-11-01 – 2020-11-04 (×4): 100 mg via INTRAVENOUS
  Filled 2020-10-31 (×4): qty 20

## 2020-10-31 MED ORDER — INSULIN ASPART 100 UNIT/ML ~~LOC~~ SOLN: 0.0000 [IU] | Freq: Three times a day (TID) | SUBCUTANEOUS | Status: DC

## 2020-10-31 MED ORDER — ASCORBIC ACID 500 MG PO TABS
500.0000 mg | ORAL_TABLET | Freq: Every day | ORAL | Status: DC
  Administered 2020-10-31 – 2020-11-04 (×5): 500 mg via ORAL
  Filled 2020-10-31 (×5): qty 1

## 2020-10-31 NOTE — ED Notes (Signed)
Erik Obey, daughter, would like an update on her mom. [phone]

## 2020-10-31 NOTE — ED Notes (Signed)
Fall bundle: Fall risk armband Bed alarm on Yellow socks Fall risk sign on door

## 2020-10-31 NOTE — ED Notes (Signed)
Patient attached to [place]

## 2020-10-31 NOTE — ED Provider Notes (Signed)
[facility] DEPT Provider Note   CSN: [id number] Arrival date & time: 10/31/20  1016     History Chief Complaint  Patient presents with  . Covid Positive  . Weakness  . Shortness of Breath    Brandi Gomez is a 49 y.o. female.  The history is provided by the patient.  Shortness of Breath Severity:  Mild Onset quality:  Gradual Timing:  Constant Progression:  Worsening Chronicity:  New Context: URI (COVID positive)   Relieved by:  Nothing Worsened by:  Nothing Associated symptoms: cough   Associated symptoms: no abdominal pain, no chest pain, no ear pain, no fever, no rash, no sore throat and no vomiting        Past Medical History:  Diagnosis Date  . Anemia   . Anxiety   . Bipolar 1 disorder ([place])   . Common migraine 05/19/2015  . Depression   . Diabetes mellitus, type II ([place])   . Hypertension   . Irritable bowel syndrome (IBS)   . Mild mental retardation   . Obesity   . Partial complex seizure disorder with intractable epilepsy ([place]) 05/12/2014  . Seizures ([place])    intractable, sz 08/23/17  . Sleep apnea   . Stroke ([place])   . Type II or unspecified type diabetes mellitus without mention of complication, not stated as uncontrolled     Patient Active Problem List   Diagnosis Date Noted  . Morbid (severe) obesity due to excess calories ([place]) 04/18/2020  . HLD (hyperlipidemia) 10/12/2019  . CKD (chronic kidney disease) stage 3, GFR 30-59 ml/min (HCC) 10/12/2019  . Chronic combined systolic and diastolic heart failure ([place])   . Depression 07/14/2019  . Bipolar disorder ([place]) 05/13/2019  . Bipolar 1 disorder ([place]) 05/13/2019  . Adjustment disorder with anxiety   . History of recent stroke 08/06/2017  . OSA (obstructive sleep apnea)   . Controlled type 2 diabetes mellitus without complication, without long-term current use of insulin ([place]) 05/08/2017  . Hypertension associated with diabetes ([place]) 07/27/2008  . Seizure  disorder ([place]) 02/05/2007    Past Surgical History:  Procedure Laterality Date  . BUBBLE STUDY  11/15/2019   Procedure: BUBBLE STUDY;  Surgeon: Dorothy Spark, MD;  Location: [facility];  Service: Cardiovascular;;  . COLONOSCOPY     2012-normal , Dr Sharlett Iles  . ESOPHAGOGASTRODUODENOSCOPY     normal-Dr Patterson 2012  . LOOP RECORDER INSERTION N/A 05/30/2017   Procedure: Loop Recorder Insertion;  Surgeon: Constance Haw, MD;  Location: [facility];  Service: Cardiovascular;  Laterality: N/A;  . LOOP RECORDER REMOVAL N/A 03/04/2018   Procedure: LOOP RECORDER REMOVAL;  Surgeon: Constance Haw, MD;  Location: [facility];  Service: Cardiovascular;  Laterality: N/A;  . MYRINGOTOMY WITH TUBE PLACEMENT    . MYRINGOTOMY WITH TUBE PLACEMENT Right 11/05/2017   Procedure: MYRINGOTOMY WITH TUBE PLACEMENT;  Surgeon: Melissa Montane, MD;  Location: [facility];  Service: ENT;  Laterality: Right;  right T Tube placement  . NASAL SINUS SURGERY    . TEE WITHOUT CARDIOVERSION N/A 05/30/2017   Procedure: TRANSESOPHAGEAL ECHOCARDIOGRAM (TEE);  Surgeon: Acie Fredrickson Wonda Cheng, MD;  Location: [facility];  Service: Cardiovascular;  Laterality: N/A;  . TEE WITHOUT CARDIOVERSION N/A 11/15/2019   Procedure: TRANSESOPHAGEAL ECHOCARDIOGRAM (TEE);  Surgeon: Dorothy Spark, MD;  Location: [facility] END[facility];  Service: Cardiovascular;  Laterality: N/A;     OB History   No obstetric history on file.     Family  History  Problem Relation Age of Onset  . Diabetes Mother        passed away from accidental death  . Hypertension Mother   . Bipolar disorder Father   . Diabetes Daughter   . Leukemia Daughter   . Ovarian cancer Maternal Aunt     Social History   Tobacco Use  . Smoking status: Never Smoker  . Smokeless tobacco: Never Used  Vaping Use  . Vaping Use: Never used  Substance Use Topics  . Alcohol use: No  . Drug use: No    Home Medications Prior to Admission medications    Medication Sig Start Date End Date Taking? Authorizing Provider  Acetaminophen (TYLENOL ARTHRITIS PAIN PO) Take by mouth as needed.    [provider]  acetaminophen (TYLENOL) 325 MG tablet Take 2 tablets (650 mg total) by mouth every 4 (four) hours as needed for mild pain (or temp > 37.5 C (99.5 F)). Patient not taking: Reported on 08/29/2020 10/19/19   Cathlyn Parsons, PA-C  ARIPiprazole (ABILIFY) 5 MG tablet 1 qam 09/01/20   Plovsky, Berneta Sages, MD  atorvastatin (LIPITOR) 40 MG tablet Take 1 tablet (40 mg total) by mouth daily at 6 PM. 01/04/20 05/02/20  Sagardia, Ines Bloomer, MD  azithromycin (ZITHROMAX) 250 MG tablet Take 2 tablets (500 mg total) by mouth daily for 1 day, THEN 1 tablet (250 mg total) daily for 4 days. 10/27/20 11/01/20  Zigmund Gottron, NP  carvedilol (COREG) 6.25 MG tablet Take 1 tablet (6.25 mg total) by mouth 2 (two) times daily with a meal. 08/29/20 11/27/20  Horald Pollen, MD  desipramine (NORPRAMIN) 25 MG tablet 1  qam 09/01/20   Plovsky, Berneta Sages, MD  dextromethorphan-guaiFENesin [facility] DM) 30-600 MG 12hr tablet Take 1 tablet by mouth 2 (two) times daily for 5 days. 10/27/20 11/01/20  Zigmund Gottron, NP  glipiZIDE (GLUCOTROL) 5 MG tablet Take 1 tablet (5 mg total) by mouth 2 (two) times daily with a meal. 08/29/20 11/27/20  Sagardia, Ines Bloomer, MD  hydrALAZINE (APRESOLINE) 10 MG tablet Take 1 tablet (10 mg total) by mouth 2 (two) times daily. 01/04/20 05/02/20  Horald Pollen, MD  ipratropium (ATROVENT) 0.06 % nasal spray Place 2 sprays into both nostrils 3 (three) times daily as needed for rhinitis. 10/27/20   Zigmund Gottron, NP  isosorbide dinitrate (ISORDIL) 10 MG tablet Take 1 tablet (10 mg total) by mouth 2 (two) times daily. 01/04/20 05/02/20  Horald Pollen, MD  lamoTRIgine (LAMICTAL) 150 MG tablet Take 1 tablet (150 mg total) by mouth 2 (two) times daily. 05/02/20   [name], MD  sitaGLIPtin (JANUVIA) 50 MG tablet Take 1 tablet  (50 mg total) by mouth daily. 08/29/20 11/27/20  Horald Pollen, MD  torsemide (DEMADEX) 10 MG tablet Take 10 mg by mouth daily.    [provider]  methylphenidate (RITALIN) 5 MG tablet Take 1 tablet (5 mg total) by mouth 2 (two) times daily. Patient not taking: Reported on 12/29/2019 10/29/19 01/01/20  Izora Ribas, MD  traZODone (DESYREL) 100 MG tablet Take 1 tablet (100 mg total) by mouth at bedtime. Patient not taking: Reported on 12/29/2019 10/19/19 01/01/20  Angiulli, Lavon Paganini, PA-C    Allergies    Amoxicillin, Lisinopril, Hydrocodone, and Tegretol [carbamazepine]  Review of Systems   Review of Systems  Constitutional: Negative for chills and fever.  HENT: Negative for ear pain and sore throat.   Eyes: Negative for pain and visual disturbance.  Respiratory: Positive for cough and shortness of breath.   Cardiovascular: Negative for chest pain and palpitations.  Gastrointestinal: Negative for abdominal pain and vomiting.  Genitourinary: Negative for dysuria and hematuria.  Musculoskeletal: Negative for arthralgias and back pain.  Skin: Negative for color change and rash.  Neurological: Negative for seizures and syncope.  All other systems reviewed and are negative.   Physical Exam Updated Vital Signs  ED Triage Vitals  Enc Vitals Group     BP 10/31/20 1042 121/69     Pulse Rate 10/31/20 1042 94     Resp 10/31/20 1042 (!) 23     Temp 10/31/20 1043 98.2 F (36.8 C)     Temp Source 10/31/20 1043 Oral     SpO2 10/31/20 1036 96 %     Weight 10/31/20 1044 250 lb (113.4 kg)     Height 10/31/20 1044 5\' 1"  (1.549 m)     Head Circumference --      Peak Flow --      Pain Score 10/31/20 1044 0     Pain Loc --      Pain Edu? --      Excl. in [place]? --     Physical Exam Vitals and nursing note reviewed.  Constitutional:      General: She is not in acute distress.    Appearance: She is well-developed. She is not ill-appearing.  HENT:     Head: Normocephalic  and atraumatic.     Mouth/Throat:     Mouth: Mucous membranes are moist.  Eyes:     Conjunctiva/sclera: Conjunctivae normal.     Pupils: Pupils are equal, round, and reactive to light.  Cardiovascular:     Rate and Rhythm: Normal rate and regular rhythm.     Pulses: Normal pulses.     Heart sounds: Normal heart sounds. No murmur heard.   Pulmonary:     Effort: Tachypnea present. No respiratory distress.     Breath sounds: Decreased breath sounds present.  Abdominal:     Palpations: Abdomen is soft.     Tenderness: There is no abdominal tenderness.  Musculoskeletal:        General: Normal range of motion.     Cervical back: Normal range of motion and neck supple.     Right lower leg: No edema.     Left lower leg: No edema.  Skin:    General: Skin is warm and dry.     Capillary Refill: Capillary refill takes less than 2 seconds.  Neurological:     General: No focal deficit present.     Mental Status: She is alert.     ED Results / Procedures / Treatments   Labs (all labs ordered are listed, but only abnormal results are displayed) Labs Reviewed  CULTURE, BLOOD (ROUTINE X 2)  CULTURE, BLOOD (ROUTINE X 2)  LACTIC ACID, PLASMA  LACTIC ACID, PLASMA  CBC WITH DIFFERENTIAL/PLATELET  COMPREHENSIVE METABOLIC PANEL  D-DIMER, QUANTITATIVE (NOT AT [facility])  PROCALCITONIN  LACTATE DEHYDROGENASE  FERRITIN  TRIGLYCERIDES  FIBRINOGEN  C-REACTIVE PROTEIN  HIV ANTIBODY (ROUTINE TESTING W REFLEX)  I-STAT BETA HCG BLOOD, ED ([facility], [facility], [facility] ONLY)    EKG None  Radiology DG Chest Port 1 View  Result Date: 10/31/2020 CLINICAL DATA:  COVID.  Shortness of breath. EXAM: PORTABLE CHEST 1 VIEW COMPARISON:  Chest radiograph 06/15/2019. FINDINGS: The heart size and mediastinal contours are within normal limits. Low lung volumes. Dense retrocardiac opacity. No visible pleural effusions or pneumothorax.  The visualized skeletal structures are unremarkable. IMPRESSION: Low lung volumes with dense  retrocardiac opacity, concerning for pneumonia. Electronically Signed   By: Margaretha Sheffield MD   On: 10/31/2020 11:25    Procedures .Critical Care Performed by: Lennice Sites, DO Authorized by: Lennice Sites, DO   Critical care provider statement:    Critical care time (minutes):  35   Critical care was necessary to treat or prevent imminent or life-threatening deterioration of the following conditions:  Respiratory failure   Critical care was time spent personally by me on the following activities:  Blood draw for specimens, development of treatment plan with patient or surrogate, discussions with primary provider, evaluation of patient's response to treatment, examination of patient, obtaining history from patient or surrogate, ordering and performing treatments and interventions, ordering and review of laboratory studies, ordering and review of radiographic studies, pulse oximetry, re-evaluation of patient's condition and review of old charts   I assumed direction of critical care for this patient from another provider in my specialty: no     (including critical care time)  Medications Ordered in ED Medications  remdesivir 200 mg in sodium chloride 0.9% 250 mL IVPB (has no administration in time range)    Followed by  remdesivir 100 mg in sodium chloride 0.9 % 100 mL IVPB (has no administration in time range)  dexamethasone (DECADRON) injection 6 mg (has no administration in time range)    ED Course  I have reviewed the triage vital signs and the nursing notes.  Pertinent labs & imaging results that were available during my care of the patient were reviewed by me and considered in my medical decision making (see chart for details).    MDM Rules/Calculators/A&P                          Brandi Gomez is a 49 year old female with history of hypertension, anxiety, bipolar who presents the ED with shortness of breath.  Patient with unremarkable vitals except for hypoxia.   Improved on 2 L of oxygen.  Diagnosed with Covid last week.  Did have antibody infusion but continues to get worse.  Overall suspect worsening Covid pneumonia.  Screening labs have been ordered.  Will order IV remdesivir and IV Decadron.  Anticipate admission for further Covid care.  Chest x-ray shows some COVID changes.  Inflammatory markers are elevated.  Hemodynamically stable.  Admitted to medicine for further Covid care.  This chart was dictated using voice recognition software.  Despite best efforts to proofread,  errors can occur which can change the documentation meaning.   Brandi Gomez was evaluated in Emergency Department on 10/31/2020 for the symptoms described in the history of present illness. She was evaluated in the context of the global COVID-19 pandemic, which necessitated consideration that the patient might be at risk for infection with the SARS-CoV-2 virus that causes COVID-19. Institutional protocols and algorithms that pertain to the evaluation of patients at risk for COVID-19 are in a state of rapid change based on information released by regulatory bodies including the CDC and federal and state organizations. These policies and algorithms were followed during the patient's care in the ED.  Final Clinical Impression(s) / ED Diagnoses Final diagnoses:  Acute respiratory failure with hypoxia [facility])  COVID-19    Rx / DC Orders ED Discharge Orders    None       Lennice Sites, DO 10/31/20 1512

## 2020-10-31 NOTE — H&P (Addendum)
History and Physical    CLARECE DRZEWIECKI IWP:[id number] DOB: 03-22-1971 DOA: 10/31/2020  PCP: Horald Pollen, MD  Patient coming from: Home  Chief Complaint: Shortness of breath  HPI: Brandi Gomez is a 49 y.o. female with medical history significant of epilepsy, HTN, DM2. Presenting with shortness of breath. Poor historian. Hx from chart review. Started last week with cough and congestion. She tried OTC meds, but they did not help. She went to Urgent Care and was found to be COVID +. She was sent for MAB. Over the weekend, her dyspnea worsened. PO intake worsened. Fatigue increased. OTC medicines did not help. She became concerned and came to the ED.    ED Course: She was found to be hypoxic. She was started on steroids, remdesivir, and oxygen. TRH was called for admission.   Review of Systems:  Review of systems is otherwise negative for all not mentioned in HPI.   PMHx Past Medical History:  Diagnosis Date  . Anemia   . Anxiety   . Bipolar 1 disorder ([place])   . Common migraine 05/19/2015  . Depression   . Diabetes mellitus, type II ([place])   . Hypertension   . Irritable bowel syndrome (IBS)   . Mild mental retardation   . Obesity   . Partial complex seizure disorder with intractable epilepsy ([place]) 05/12/2014  . Seizures ([place])    intractable, sz 08/23/17  . Sleep apnea   . Stroke ([place])   . Type II or unspecified type diabetes mellitus without mention of complication, not stated as uncontrolled     PSHx Past Surgical History:  Procedure Laterality Date  . BUBBLE STUDY  11/15/2019   Procedure: BUBBLE STUDY;  Surgeon: Dorothy Spark, MD;  Location: [facility];  Service: Cardiovascular;;  . COLONOSCOPY     2012-normal , Dr Sharlett Iles  . ESOPHAGOGASTRODUODENOSCOPY     normal-Dr Patterson 2012  . LOOP RECORDER INSERTION N/A 05/30/2017   Procedure: Loop Recorder Insertion;  Surgeon: Constance Haw, MD;  Location: [facility];  Service: Cardiovascular;   Laterality: N/A;  . LOOP RECORDER REMOVAL N/A 03/04/2018   Procedure: LOOP RECORDER REMOVAL;  Surgeon: Constance Haw, MD;  Location: [facility];  Service: Cardiovascular;  Laterality: N/A;  . MYRINGOTOMY WITH TUBE PLACEMENT    . MYRINGOTOMY WITH TUBE PLACEMENT Right 11/05/2017   Procedure: MYRINGOTOMY WITH TUBE PLACEMENT;  Surgeon: Melissa Montane, MD;  Location: [facility];  Service: ENT;  Laterality: Right;  right T Tube placement  . NASAL SINUS SURGERY    . TEE WITHOUT CARDIOVERSION N/A 05/30/2017   Procedure: TRANSESOPHAGEAL ECHOCARDIOGRAM (TEE);  Surgeon: Acie Fredrickson Wonda Cheng, MD;  Location: [facility];  Service: Cardiovascular;  Laterality: N/A;  . TEE WITHOUT CARDIOVERSION N/A 11/15/2019   Procedure: TRANSESOPHAGEAL ECHOCARDIOGRAM (TEE);  Surgeon: Dorothy Spark, MD;  Location: [facility] ENDOSCOPY;  Service: Cardiovascular;  Laterality: N/A;    SocHx  reports that she has never smoked. She has never used smokeless tobacco. She reports that she does not drink alcohol and does not use drugs.  Allergies  Allergen Reactions  . Amoxicillin Itching    Did it involve swelling of the face/tongue/throat, SOB, or low BP? No Did it involve sudden or severe rash/hives, skin peeling, or any reaction on the inside of your mouth or nose? No Did you need to seek medical attention at a hospital or doctor's office? No When did it last happen?within the past 10 years If all above answers are "  NO", may proceed with cephalosporin use.   Marland Kitchen Lisinopril Swelling    Angioedema   . Hydrocodone Other (See Comments)    Depressed   . Tegretol [Carbamazepine] Swelling    Throat swells    FamHx Family History  Problem Relation Age of Onset  . Diabetes Mother        passed away from accidental death  . Hypertension Mother   . Bipolar disorder Father   . Diabetes Daughter   . Leukemia Daughter   . Ovarian cancer Maternal Aunt     Prior to Admission medications   Medication Sig Start Date End  Date Taking? Authorizing Provider  Acetaminophen (TYLENOL ARTHRITIS PAIN PO) Take by mouth as needed.    [provider]  acetaminophen (TYLENOL) 325 MG tablet Take 2 tablets (650 mg total) by mouth every 4 (four) hours as needed for mild pain (or temp > 37.5 C (99.5 F)). Patient not taking: Reported on 08/29/2020 10/19/19   Cathlyn Parsons, PA-C  ARIPiprazole (ABILIFY) 5 MG tablet 1 qam 09/01/20   Plovsky, Berneta Sages, MD  atorvastatin (LIPITOR) 40 MG tablet Take 1 tablet (40 mg total) by mouth daily at 6 PM. 01/04/20 05/02/20  Sagardia, Ines Bloomer, MD  azithromycin (ZITHROMAX) 250 MG tablet Take 2 tablets (500 mg total) by mouth daily for 1 day, THEN 1 tablet (250 mg total) daily for 4 days. 10/27/20 11/01/20  Zigmund Gottron, NP  carvedilol (COREG) 6.25 MG tablet Take 1 tablet (6.25 mg total) by mouth 2 (two) times daily with a meal. 08/29/20 11/27/20  Horald Pollen, MD  desipramine (NORPRAMIN) 25 MG tablet 1  qam 09/01/20   Plovsky, Berneta Sages, MD  dextromethorphan-guaiFENesin [facility] DM) 30-600 MG 12hr tablet Take 1 tablet by mouth 2 (two) times daily for 5 days. 10/27/20 11/01/20  Zigmund Gottron, NP  glipiZIDE (GLUCOTROL) 5 MG tablet Take 1 tablet (5 mg total) by mouth 2 (two) times daily with a meal. 08/29/20 11/27/20  Sagardia, Ines Bloomer, MD  hydrALAZINE (APRESOLINE) 10 MG tablet Take 1 tablet (10 mg total) by mouth 2 (two) times daily. 01/04/20 05/02/20  Horald Pollen, MD  ipratropium (ATROVENT) 0.06 % nasal spray Place 2 sprays into both nostrils 3 (three) times daily as needed for rhinitis. 10/27/20   Zigmund Gottron, NP  isosorbide dinitrate (ISORDIL) 10 MG tablet Take 1 tablet (10 mg total) by mouth 2 (two) times daily. 01/04/20 05/02/20  Horald Pollen, MD  lamoTRIgine (LAMICTAL) 150 MG tablet Take 1 tablet (150 mg total) by mouth 2 (two) times daily. 05/02/20   [name], MD  sitaGLIPtin (JANUVIA) 50 MG tablet Take 1 tablet (50 mg total) by mouth daily.  08/29/20 11/27/20  Horald Pollen, MD  torsemide (DEMADEX) 10 MG tablet Take 10 mg by mouth daily.    [provider]  methylphenidate (RITALIN) 5 MG tablet Take 1 tablet (5 mg total) by mouth 2 (two) times daily. Patient not taking: Reported on 12/29/2019 10/29/19 01/01/20  Izora Ribas, MD  traZODone (DESYREL) 100 MG tablet Take 1 tablet (100 mg total) by mouth at bedtime. Patient not taking: Reported on 12/29/2019 10/19/19 01/01/20  Cathlyn Parsons, PA-C    Physical Exam: Vitals:   10/31/20 1315 10/31/20 1330 10/31/20 1345 10/31/20 1400  BP:  117/75  123/75  Pulse: 79 69 68 72  Resp: (!) 26 (!) 32 (!) 31 (!) 24  Temp:      TempSrc:  SpO2: 92% 99% 100% 100%  Weight:      Height:        General: 49 y.o. female resting in bed in NAD Eyes: PERRL, normal sclera ENMT: Nares patent w/o discharge, orophaynx clear, dentition normal, ears w/o discharge/lesions/ulcers Neck: Supple, trachea midline Cardiovascular: RRR, +S1, S2, no m/g/r, equal pulses throughout Respiratory: decreased at bases, no w/r/r, normal WOB on 2L [place] GI: BS+, NDNT, no masses noted, no organomegaly noted MSK: No e/c/c Skin: No rashes, bruises, ulcerations noted Neuro: A&O x 3, no focal deficits Psyc: Appropriate interaction and affect, calm/cooperative  Labs on Admission: I have personally reviewed following labs and imaging studies  CBC: Recent Labs  Lab 10/31/20 1124  WBC 4.9  NEUTROABS 3.6  HGB 9.6*  HCT 31.8*  MCV 88.3  PLT 353   Basic Metabolic Panel: Recent Labs  Lab 10/31/20 1124  NA 139  K 4.0  CL 103  CO2 27  GLUCOSE 165*  BUN 15  CREATININE 1.58*  CALCIUM 8.2*   GFR: Estimated Creatinine Clearance: 49.2 mL/min (A) (by C-G formula based on SCr of 1.58 mg/dL (H)). Liver Function Tests: Recent Labs  Lab 10/31/20 1124  AST 77*  ALT 32  ALKPHOS 63  BILITOT 0.6  PROT 8.1  ALBUMIN 3.5   No results for input(s): LIPASE, AMYLASE in the last 168 hours. No  results for input(s): AMMONIA in the last 168 hours. Coagulation Profile: No results for input(s): INR, PROTIME in the last 168 hours. Cardiac Enzymes: No results for input(s): CKTOTAL, CKMB, CKMBINDEX, TROPONINI in the last 168 hours. BNP (last 3 results) No results for input(s): PROBNP in the last 8760 hours. HbA1C: No results for input(s): HGBA1C in the last 72 hours. CBG: No results for input(s): GLUCAP in the last 168 hours. Lipid Profile: No results for input(s): CHOL, HDL, LDLCALC, TRIG, CHOLHDL, LDLDIRECT in the last 72 hours. Thyroid Function Tests: No results for input(s): TSH, T4TOTAL, FREET4, T3FREE, THYROIDAB in the last 72 hours. Anemia Panel: Recent Labs    10/31/20 1124  FERRITIN 71   Urine analysis:    Component Value Date/Time   COLORURINE YELLOW 05/23/2020 0042   APPEARANCEUR CLEAR 05/23/2020 0042   LABSPEC 1.025 06/29/2020 1524   PHURINE 5.5 06/29/2020 1524   GLUCOSEU NEGATIVE 06/29/2020 1524   HGBUR NEGATIVE 06/29/2020 1524   BILIRUBINUR NEGATIVE 06/29/2020 1524   BILIRUBINUR negative 02/10/2018 1020   BILIRUBINUR negative 07/01/2015 1108   [place] 06/29/2020 1524   PROTEINUR NEGATIVE 06/29/2020 1524   UROBILINOGEN 0.2 06/29/2020 1524   NITRITE NEGATIVE 06/29/2020 1524   LEUKOCYTESUR NEGATIVE 06/29/2020 1524    Radiological Exams on Admission: DG Chest Port 1 View  Result Date: 10/31/2020 CLINICAL DATA:  COVID.  Shortness of breath. EXAM: PORTABLE CHEST 1 VIEW COMPARISON:  Chest radiograph 06/15/2019. FINDINGS: The heart size and mediastinal contours are within normal limits. Low lung volumes. Dense retrocardiac opacity. No visible pleural effusions or pneumothorax. The visualized skeletal structures are unremarkable. IMPRESSION: Low lung volumes with dense retrocardiac opacity, concerning for pneumonia. Electronically Signed   By: Margaretha Sheffield MD   On: 10/31/2020 11:25    Assessment/Plan COVID 19 PNA Acute respiratory failure w/  hypoxia     - admit to inpatient, med-surg     - decadron, remdes, combivent, anti-tussives, flutter, IS, O2 support  Epilepsy     - continue home anti-epileptics once med history is complete  DM2     - SSI, DM diet, A1c, glucose  CKD3a     -  Gfr is a little off baseline. Watch nephrotoxins.  Normocytic anemia     - no evidence of bleed. Check iron studies.  HTN Chronic HFrEF     - continue coreg; holding her other meds d/t soft pressures  DVT prophylaxis: lovenox  Code Status: FULL  Family Communication: spoke with dtr by phone  Consults called: None  Status is: Inpatient  Remains inpatient appropriate because:Inpatient level of care appropriate due to severity of illness   Dispo: The patient is from: Home              Anticipated d/c is to: Home              Anticipated d/c date is: 3 days              Patient currently is not medically stable to d/c.  Jonnie Finner DO [facility]  If 7PM-7AM, please contact night-coverage [email or web address]  10/31/2020, 2:06 PM

## 2020-10-31 NOTE — ED Triage Notes (Signed)
Patient BIB [facility] c/o Covid diagnosed a week ago, increased SOB X 4 days.  NRB at 10L 99%.  Vitals were  80-HR 184/100 22-RR  No temp  No CBG

## 2020-11-01 DIAGNOSIS — J9601 Acute respiratory failure with hypoxia: Secondary | ICD-10-CM | POA: Diagnosis not present

## 2020-11-01 LAB — CBC WITH DIFFERENTIAL/PLATELET
Abs Immature Granulocytes: 0.01 10*3/uL (ref 0.00–0.07)
Basophils Absolute: 0 10*3/uL (ref 0.0–0.1)
Basophils Relative: 0 %
Eosinophils Absolute: 0 10*3/uL (ref 0.0–0.5)
Eosinophils Relative: 0 %
HCT: 31.5 % — ABNORMAL LOW (ref 36.0–46.0)
Hemoglobin: 9.6 g/dL — ABNORMAL LOW (ref 12.0–15.0)
Immature Granulocytes: 0 %
Lymphocytes Relative: 21 %
Lymphs Abs: 0.6 10*3/uL — ABNORMAL LOW (ref 0.7–4.0)
MCH: 26.7 pg (ref 26.0–34.0)
MCHC: 30.5 g/dL (ref 30.0–36.0)
MCV: 87.5 fL (ref 80.0–100.0)
Monocytes Absolute: 0.2 10*3/uL (ref 0.1–1.0)
Monocytes Relative: 7 %
Neutro Abs: 2 10*3/uL (ref 1.7–7.7)
Neutrophils Relative %: 72 %
Platelets: 254 10*3/uL (ref 150–400)
RBC: 3.6 MIL/uL — ABNORMAL LOW (ref 3.87–5.11)
RDW: 16.6 % — ABNORMAL HIGH (ref 11.5–15.5)
WBC: 2.8 10*3/uL — ABNORMAL LOW (ref 4.0–10.5)
nRBC: 0 % (ref 0.0–0.2)

## 2020-11-01 LAB — COMPREHENSIVE METABOLIC PANEL
ALT: 29 U/L (ref 0–44)
AST: 51 U/L — ABNORMAL HIGH (ref 15–41)
Albumin: 3.2 g/dL — ABNORMAL LOW (ref 3.5–5.0)
Alkaline Phosphatase: 62 U/L (ref 38–126)
Anion gap: 10 (ref 5–15)
BUN: 20 mg/dL (ref 6–20)
CO2: 24 mmol/L (ref 22–32)
Calcium: 8.3 mg/dL — ABNORMAL LOW (ref 8.9–10.3)
Chloride: 104 mmol/L (ref 98–111)
Creatinine, Ser: 1.62 mg/dL — ABNORMAL HIGH (ref 0.44–1.00)
GFR, Estimated: 38 mL/min — ABNORMAL LOW (ref >60)
Glucose, Bld: 195 mg/dL — ABNORMAL HIGH (ref 70–99)
Potassium: 4.5 mmol/L (ref 3.5–5.1)
Sodium: 138 mmol/L (ref 135–145)
Total Bilirubin: 0.7 mg/dL (ref 0.3–1.2)
Total Protein: 7.5 g/dL (ref 6.5–8.1)

## 2020-11-01 LAB — GLUCOSE, CAPILLARY
Glucose-Capillary: 190 mg/dL — ABNORMAL HIGH (ref 70–99)
Glucose-Capillary: 164 mg/dL — ABNORMAL HIGH (ref 70–99)
Glucose-Capillary: 257 mg/dL — ABNORMAL HIGH (ref 70–99)
Glucose-Capillary: 286 mg/dL — ABNORMAL HIGH (ref 70–99)

## 2020-11-01 LAB — FERRITIN: Ferritin: 67 ng/mL (ref 11–307)

## 2020-11-01 LAB — C-REACTIVE PROTEIN: CRP: 7.6 mg/dL — ABNORMAL HIGH (ref <1.0)

## 2020-11-01 LAB — MAGNESIUM: Magnesium: 2.6 mg/dL — ABNORMAL HIGH (ref 1.7–2.4)

## 2020-11-01 LAB — D-DIMER, QUANTITATIVE: D-Dimer, Quant: 0.82 ug/mL-FEU — ABNORMAL HIGH (ref 0.00–0.50)

## 2020-11-01 NOTE — Progress Notes (Signed)
PROGRESS NOTE  Brandi Gomez LSL:[id number] DOB: June 19, 1971 DOA: 10/31/2020 PCP: Horald Pollen, MD   LOS: 1 day   Brief Narrative / Interim history: 49 year old female with history of epilepsy, HTN, DM 2, came to the hospital with shortness of breath.  This was started last week with cough and congestion.  She was found to be Covid positive as an outpatient, her dyspnea worsened and came to the hospital on 11/23.  Subjective / 24h Interval events: She is still feeling short of breath this morning.  She is a very poor historian  Assessment & Plan:  Principal Problem Acute Hypoxic Respiratory Failure due to Covid-19 Viral Illness -Patient remains hypoxic requiring 2 L nasal cannula to maintain sats in the 90s.  She was started on remdesivir and steroids, continue -Continue steroids with Decadron, wean off oxygen as tolerated.  Fortunately she is only requiring 2 L   COVID-19 Labs  Recent Labs    10/31/20 1124 11/01/20 0439  DDIMER 1.36* 0.82*  FERRITIN 71 67  LDH 360*  --   CRP 10.1* 7.6*    Lab Results  Component Value Date   SARSCOV2NAA POSITIVE (A) 10/27/2020   [place] NEGATIVE 11/11/2019   [place] NEGATIVE 10/12/2019   SARSCOV2NAA NOT DETECTED 06/15/2019    Active Problems History of epilepsy -Continue home medications  Hyperlipidemia -Continue statin  Essential hypertension -Continue Coreg, blood pressure stable  DM2 -Hold home oral agents, placed on sliding scale, monitor CBGs while on steroids  Scheduled Meds: . ARIPiprazole  5 mg Oral Daily  . vitamin C  500 mg Oral Daily  . atorvastatin  40 mg Oral q1800  . carvedilol  3.125 mg Oral BID  . desipramine  25 mg Oral QHS  . dexamethasone (DECADRON) injection  6 mg Intravenous Q24H  . enoxaparin (LOVENOX) injection  50 mg Subcutaneous Q24H  . insulin aspart  0-15 Units Subcutaneous TID WC  . insulin aspart  0-5 Units Subcutaneous QHS  . Ipratropium-Albuterol  1 puff Inhalation Q6H   . lamoTRIgine  100 mg Oral BID  . zinc sulfate  220 mg Oral Daily   Continuous Infusions: . remdesivir 100 mg in NS 100 mL 100 mg (11/01/20 0917)   PRN Meds:.acetaminophen, guaiFENesin-dextromethorphan, ondansetron **OR** ondansetron (ZOFRAN) IV  DVT prophylaxis: Lovenox Code Status: Full code Family Communication: no family present   Status is: Inpatient  Remains inpatient appropriate because:Inpatient level of care appropriate due to severity of illness   Dispo: The patient is from: Home              Anticipated d/c is to: Home              Anticipated d/c date is: 3 days              Patient currently is not medically stable to d/c.  Consultants:  None   Procedures:  None   Microbiology: None   Antibacterials: None    Objective: Vitals:   10/31/20 1637 10/31/20 2121 11/01/20 0034 11/01/20 0523  BP: (!) 141/85 102/63 (!) 106/59 (!) 102/54  Pulse: 83 80 70 69  Resp: 19 (!) 24 18 (!) 24  Temp: 98.2 F (36.8 C) 98.4 F (36.9 C) 98.1 F (36.7 C) 98 F (36.7 C)  TempSrc:      SpO2: 94% 94% 92% 90%  Weight:      Height:        Intake/Output Summary (Last 24 hours) at 11/01/2020 1321 Last data filed at 11/01/2020  1000 Gross per 24 hour  Intake 650.47 ml  Output 300 ml  Net 350.47 ml   Filed Weights   11-23-2020 1044  Weight: 113.4 kg    Examination:  Constitutional: NAD Eyes: no scleral icterus ENMT: Mucous membranes are moist.  Neck: normal, supple Respiratory: Faint bibasilar rhonchi, no wheezing, no crackles Cardiovascular: Regular rate and rhythm, no murmurs / rubs / gallops. No LE edema.  Abdomen: non distended, no tenderness. Bowel sounds positive.  Musculoskeletal: no clubbing / cyanosis.  Skin: no rashes Neurologic: CN 2-12 grossly intact. Strength 5/5 in all 4.  Psychiatric: Normal judgment and insight. Alert and oriented x 3. Normal mood.   Data Reviewed: I have independently reviewed following labs and imaging studies    CBC: Recent Labs  Lab Nov 23, 2020 1124 11/01/20 0439  WBC 4.9 2.8*  NEUTROABS 3.6 2.0  HGB 9.6* 9.6*  HCT 31.8* 31.5*  MCV 88.3 87.5  PLT 238 045   Basic Metabolic Panel: Recent Labs  Lab 2020-11-23 1124 11/01/20 0439  NA 139 138  K 4.0 4.5  CL 103 104  CO2 27 24  GLUCOSE 165* 195*  BUN 15 20  CREATININE 1.58* 1.62*  CALCIUM 8.2* 8.3*  MG  --  2.6*   GFR: Estimated Creatinine Clearance: 48 mL/min (A) (by C-G formula based on SCr of 1.62 mg/dL (H)). Liver Function Tests: Recent Labs  Lab 11/23/20 1124 11/01/20 0439  AST 77* 51*  ALT 32 29  ALKPHOS 63 62  BILITOT 0.6 0.7  PROT 8.1 7.5  ALBUMIN 3.5 3.2*   No results for input(s): LIPASE, AMYLASE in the last 168 hours. No results for input(s): AMMONIA in the last 168 hours. Coagulation Profile: No results for input(s): INR, PROTIME in the last 168 hours. Cardiac Enzymes: No results for input(s): CKTOTAL, CKMB, CKMBINDEX, TROPONINI in the last 168 hours. BNP (last 3 results) No results for input(s): PROBNP in the last 8760 hours. HbA1C: Recent Labs    11-23-20 1124  HGBA1C 7.9*   CBG: Recent Labs  Lab 23-Nov-2020 1650 Nov 23, 2020 2057 11/01/20 0731 11/01/20 1122  GLUCAP 190* 216* 190* 164*   Lipid Profile: Recent Labs    2020-11-23 1124  TRIG 100   Thyroid Function Tests: No results for input(s): TSH, T4TOTAL, FREET4, T3FREE, THYROIDAB in the last 72 hours. Anemia Panel: Recent Labs    11-23-2020 1124 11/01/20 0439  FERRITIN 71 67  TIBC 315  --   IRON 23*  --    Urine analysis:    Component Value Date/Time   COLORURINE YELLOW 05/23/2020 0042   APPEARANCEUR CLEAR 05/23/2020 0042   LABSPEC 1.025 06/29/2020 1524   PHURINE 5.5 06/29/2020 1524   GLUCOSEU NEGATIVE 06/29/2020 1524   HGBUR NEGATIVE 06/29/2020 1524   BILIRUBINUR NEGATIVE 06/29/2020 1524   BILIRUBINUR negative 02/10/2018 1020   BILIRUBINUR negative 07/01/2015 1108   [place] 06/29/2020 1524   PROTEINUR NEGATIVE 06/29/2020  1524   UROBILINOGEN 0.2 06/29/2020 1524   NITRITE NEGATIVE 06/29/2020 1524   LEUKOCYTESUR NEGATIVE 06/29/2020 1524   Sepsis Labs: Invalid input(s): PROCALCITONIN, LACTICIDVEN  Recent Results (from the past 240 hour(s))  Resp Panel by RT PCR (RSV, Flu A&B, Covid) - Nasopharyngeal Swab     Status: Abnormal   Collection Time: 10/27/20  3:20 PM   Specimen: Nasopharyngeal Swab; Nasopharyngeal(NP) swabs in vial transport medium  Result Value Ref Range Status   SARS Coronavirus 2 by RT PCR POSITIVE (A) NEGATIVE Final    Comment: RESULT CALLED TO, READ BACK  BY AND VERIFIED WITH: Rosalyn Charters RN 10/27/20 AT 2303 SK (NOTE) SARS-CoV-2 target nucleic acids are DETECTED.  SARS-CoV-2 RNA is generally detectable in upper respiratory specimens  during the acute phase of infection. Positive results are indicative of the presence of the identified virus, but do not rule out bacterial infection or co-infection with other pathogens not detected by the test. Clinical correlation with patient history and other diagnostic information is necessary to determine patient infection status. The expected result is Negative.  Fact Sheet for Patients:  PinkCheek.be  Fact Sheet for Healthcare Providers: GravelBags.it  This test is not yet approved or cleared by the [place] FDA and  has been authorized for detection and/or diagnosis of SARS-CoV-2 by FDA under an Emergency Use Authorization (EUA).  This EUA will remain in effect (meaning this test can be u sed) for the duration of  the COVID-19 declaration under Section 564(b)(1) of the Act, 21 U.S.C. section 360bbb-3(b)(1), unless the authorization is terminated or revoked sooner.      Influenza A by PCR NEGATIVE NEGATIVE Final   Influenza B by PCR NEGATIVE NEGATIVE Final    Comment: (NOTE) The Xpert Xpress SARS-CoV-2/FLU/RSV assay is intended as an aid in  the diagnosis of influenza from  Nasopharyngeal swab specimens and  should not be used as a sole basis for treatment. Nasal washings and  aspirates are unacceptable for Xpert Xpress SARS-CoV-2/FLU/RSV  testing.  Fact Sheet for Patients: PinkCheek.be  Fact Sheet for Healthcare Providers: GravelBags.it  This test is not yet approved or cleared by the [place] FDA and  has been authorized for detection and/or diagnosis of SARS-CoV-2 by  FDA under an Emergency Use Authorization (EUA). This EUA will remain  in effect (meaning this test can be used) for the duration of the  Covid-19 declaration under Section 564(b)(1) of the Act, 21  U.S.C. section 360bbb-3(b)(1), unless the authorization is  terminated or revoked.    Respiratory Syncytial Virus by PCR NEGATIVE NEGATIVE Final    Comment: (NOTE) Fact Sheet for Patients: PinkCheek.be  Fact Sheet for Healthcare Providers: GravelBags.it  This test is not yet approved or cleared by the [place] FDA and  has been authorized for detection and/or diagnosis of SARS-CoV-2 by  FDA under an Emergency Use Authorization (EUA). This EUA will remain  in effect (meaning this test can be used) for the duration of the  COVID-19 declaration under Section 564(b)(1) of the Act, 21 U.S.C.  section 360bbb-3(b)(1), unless the authorization is terminated or  revoked. Performed at [facility], [place] [address] [place], [place] [address]   Blood Culture (routine x 2)     Status: None (Preliminary result)   Collection Time: 10/31/20 11:24 AM   Specimen: BLOOD  Result Value Ref Range Status   Specimen Description   Final    BLOOD LEFT ANTECUBITAL Performed at [facility] [address] [place], [place] [address]    Special Requests   Final    BOTTLES DRAWN AEROBIC AND ANAEROBIC Blood Culture results may not be optimal due to an excessive  volume of blood received in culture bottles Performed at [facility] [address], [place], [place] [address]    Culture   Final    NO GROWTH < 24 HOURS Performed at [facility] [address] [place],  [address]    Report Status PENDING  Incomplete  Blood Culture (routine x 2)     Status: None (Preliminary  result)   Collection Time: 10/31/20 11:30 AM   Specimen: BLOOD  Result Value Ref Range Status   Specimen Description   Final    BLOOD RIGHT ANTECUBITAL Performed at [facility] [address] [place], [place] [address]    Special Requests   Final    BOTTLES DRAWN AEROBIC AND ANAEROBIC Blood Culture results may not be optimal due to an excessive volume of blood received in culture bottles Performed at [facility] [address], [place], [place] [address]    Culture   Final    NO GROWTH < 24 HOURS Performed at [facility] [address] [place], [place] [address]    Report Status PENDING  Incomplete      Radiology Studies: DG Chest Port 1 View  Result Date: 10/31/2020 CLINICAL DATA:  COVID.  Shortness of breath. EXAM: PORTABLE CHEST 1 VIEW COMPARISON:  Chest radiograph 06/15/2019. FINDINGS: The heart size and mediastinal contours are within normal limits. Low lung volumes. Dense retrocardiac opacity. No visible pleural effusions or pneumothorax. The visualized skeletal structures are unremarkable. IMPRESSION: Low lung volumes with dense retrocardiac opacity, concerning for pneumonia. Electronically Signed   By: Margaretha Sheffield MD   On: 10/31/2020 11:25    Marzetta Board, MD, PhD [facility]  Between 7 am - 7 pm I am available, please contact me via Amion or Securechat  Between 7 pm - 7 am I am not available, please contact night coverage MD/APP via Amion

## 2020-11-02 ENCOUNTER — Other Ambulatory Visit: Payer: Self-pay | Admitting: Emergency Medicine

## 2020-11-02 DIAGNOSIS — J9601 Acute respiratory failure with hypoxia: Secondary | ICD-10-CM | POA: Diagnosis not present

## 2020-11-02 LAB — GLUCOSE, CAPILLARY
Glucose-Capillary: 195 mg/dL — ABNORMAL HIGH (ref 70–99)
Glucose-Capillary: 276 mg/dL — ABNORMAL HIGH (ref 70–99)
Glucose-Capillary: 372 mg/dL — ABNORMAL HIGH (ref 70–99)
Glucose-Capillary: 276 mg/dL — ABNORMAL HIGH (ref 70–99)

## 2020-11-02 LAB — C-REACTIVE PROTEIN: CRP: 4.7 mg/dL — ABNORMAL HIGH (ref <1.0)

## 2020-11-02 LAB — COMPREHENSIVE METABOLIC PANEL
ALT: 25 U/L (ref 0–44)
AST: 34 U/L (ref 15–41)
Albumin: 3.4 g/dL — ABNORMAL LOW (ref 3.5–5.0)
Alkaline Phosphatase: 65 U/L (ref 38–126)
Anion gap: 11 (ref 5–15)
BUN: 25 mg/dL — ABNORMAL HIGH (ref 6–20)
CO2: 21 mmol/L — ABNORMAL LOW (ref 22–32)
Calcium: 8.3 mg/dL — ABNORMAL LOW (ref 8.9–10.3)
Chloride: 104 mmol/L (ref 98–111)
Creatinine, Ser: 1.4 mg/dL — ABNORMAL HIGH (ref 0.44–1.00)
GFR, Estimated: 46 mL/min — ABNORMAL LOW (ref >60)
Glucose, Bld: 241 mg/dL — ABNORMAL HIGH (ref 70–99)
Potassium: 4.1 mmol/L (ref 3.5–5.1)
Sodium: 136 mmol/L (ref 135–145)
Total Bilirubin: 0.5 mg/dL (ref 0.3–1.2)
Total Protein: 7.7 g/dL (ref 6.5–8.1)

## 2020-11-02 LAB — CBC WITH DIFFERENTIAL/PLATELET
Abs Immature Granulocytes: 0.02 10*3/uL (ref 0.00–0.07)
Basophils Absolute: 0 10*3/uL (ref 0.0–0.1)
Basophils Relative: 0 %
Eosinophils Absolute: 0 10*3/uL (ref 0.0–0.5)
Eosinophils Relative: 0 %
HCT: 32.6 % — ABNORMAL LOW (ref 36.0–46.0)
Hemoglobin: 9.6 g/dL — ABNORMAL LOW (ref 12.0–15.0)
Immature Granulocytes: 0 %
Lymphocytes Relative: 20 %
Lymphs Abs: 0.9 10*3/uL (ref 0.7–4.0)
MCH: 25.9 pg — ABNORMAL LOW (ref 26.0–34.0)
MCHC: 29.4 g/dL — ABNORMAL LOW (ref 30.0–36.0)
MCV: 88.1 fL (ref 80.0–100.0)
Monocytes Absolute: 0.4 10*3/uL (ref 0.1–1.0)
Monocytes Relative: 8 %
Neutro Abs: 3.3 10*3/uL (ref 1.7–7.7)
Neutrophils Relative %: 72 %
Platelets: 306 10*3/uL (ref 150–400)
RBC: 3.7 MIL/uL — ABNORMAL LOW (ref 3.87–5.11)
RDW: 16.7 % — ABNORMAL HIGH (ref 11.5–15.5)
WBC: 4.7 10*3/uL (ref 4.0–10.5)
nRBC: 0 % (ref 0.0–0.2)

## 2020-11-02 LAB — D-DIMER, QUANTITATIVE: D-Dimer, Quant: 0.73 ug/mL-FEU — ABNORMAL HIGH (ref 0.00–0.50)

## 2020-11-02 LAB — MAGNESIUM: Magnesium: 2.6 mg/dL — ABNORMAL HIGH (ref 1.7–2.4)

## 2020-11-02 LAB — FERRITIN: Ferritin: 62 ng/mL (ref 11–307)

## 2020-11-02 MED ORDER — IPRATROPIUM-ALBUTEROL 20-100 MCG/ACT IN AERS
1.0000 | INHALATION_SPRAY | Freq: Two times a day (BID) | RESPIRATORY_TRACT | Status: DC
  Administered 2020-11-03 – 2020-11-04 (×3): 1 via RESPIRATORY_TRACT

## 2020-11-02 NOTE — Progress Notes (Signed)
PROGRESS NOTE  Brandi Gomez GBT:[id number] DOB: 1971-05-07 DOA: 10/31/2020 PCP: Horald Pollen, MD   LOS: 2 days   Brief Narrative / Interim history: 49 year old female with history of epilepsy, HTN, DM 2, came to the hospital with shortness of breath.  This was started last week with cough and congestion.  She was found to be Covid positive as an outpatient, her dyspnea worsened and came to the hospital on 11/23.  Subjective / 24h Interval events: Complains of shortness of breath, difficulties taking deep breath.  Assessment & Plan:  Principal Problem Acute Hypoxic Respiratory Failure due to Covid-19 Viral Illness -Patient remains hypoxic requiring 2 L nasal cannula to maintain sats in the 90s.  She was started on remdesivir and steroids, continue -Continue steroids with Decadron. Remains on 2 L, wean off as tolerated   COVID-19 Labs  Recent Labs    10/31/20 1124 11/01/20 0439 11/02/20 0401  DDIMER 1.36* 0.82* 0.73*  FERRITIN 71 67 62  LDH 360*  --   --   CRP 10.1* 7.6* 4.7*    Lab Results  Component Value Date   SARSCOV2NAA POSITIVE (A) 10/27/2020   [place] NEGATIVE 11/11/2019   [place] NEGATIVE 10/12/2019   SARSCOV2NAA NOT DETECTED 06/15/2019    Active Problems History of epilepsy -Continue home medications  Hyperlipidemia -Continue statin  Essential hypertension -Continue Coreg, blood pressure stable  DM2 -Hold home oral agents, placed on sliding scale, monitor CBGs while on steroids  Scheduled Meds:  ARIPiprazole  5 mg Oral Daily   vitamin C  500 mg Oral Daily   atorvastatin  40 mg Oral q1800   carvedilol  3.125 mg Oral BID   desipramine  25 mg Oral QHS   dexamethasone (DECADRON) injection  6 mg Intravenous Q24H   enoxaparin (LOVENOX) injection  50 mg Subcutaneous Q24H   insulin aspart  0-15 Units Subcutaneous TID WC   insulin aspart  0-5 Units Subcutaneous QHS   Ipratropium-Albuterol  1 puff Inhalation Q6H    lamoTRIgine  100 mg Oral BID   zinc sulfate  220 mg Oral Daily   Continuous Infusions:  remdesivir 100 mg in NS 100 mL 100 mg (11/02/20 0900)   PRN Meds:.acetaminophen, guaiFENesin-dextromethorphan, ondansetron **OR** ondansetron (ZOFRAN) IV  DVT prophylaxis: Lovenox Code Status: Full code Family Communication: no family present  Status is: Inpatient  Remains inpatient appropriate because:Inpatient level of care appropriate due to severity of illness  Dispo: The patient is from: Home              Anticipated d/c is to: Home              Anticipated d/c date is: 3 days              Patient currently is not medically stable to d/c.  Consultants:  None   Procedures:  None   Microbiology: None   Antibacterials: None    Objective: Vitals:   11/01/20 0523 11/01/20 1328 11/01/20 2139 11/02/20 0703  BP: (!) 102/54 136/85 105/72 115/69  Pulse: 69 88 71 72  Resp: (!) 24 (!) 22 (!) 22 20  Temp: 98 F (36.7 C) 98.2 F (36.8 C) (!) 97.5 F (36.4 C) 98.2 F (36.8 C)  TempSrc:  Oral Oral   SpO2: 90% 95% 94% 97%  Weight:      Height:        Intake/Output Summary (Last 24 hours) at 11/02/2020 1110 Last data filed at 11/02/2020 0900 Gross per 24 hour  Intake 820 ml  Output 150 ml  Net 670 ml   Filed Weights   10/31/20 1044  Weight: 113.4 kg    Examination:  Constitutional: No distress Eyes: No icterus ENMT: Moist membranes Neck: normal, supple Respiratory: Faint rhonchi at the bases, no wheezing, no crackles, moves air well Cardiovascular: Regular rate and rhythm, no murmur, no edema Abdomen: soft, nontender, nondistended, bowel sounds positive Musculoskeletal: no clubbing / cyanosis.  Skin: no rashes Neurologic: Nonfocal, equal strength  Data Reviewed: I have independently reviewed following labs and imaging studies   CBC: Recent Labs  Lab 10/31/20 1124 11/01/20 0439 11/02/20 0401  WBC 4.9 2.8* 4.7  NEUTROABS 3.6 2.0 3.3  HGB 9.6* 9.6* 9.6*  HCT  31.8* 31.5* 32.6*  MCV 88.3 87.5 88.1  PLT 238 254 585   Basic Metabolic Panel: Recent Labs  Lab 10/31/20 1124 11/01/20 0439 11/02/20 0401  NA 139 138 136  K 4.0 4.5 4.1  CL 103 104 104  CO2 27 24 21*  GLUCOSE 165* 195* 241*  BUN 15 20 25*  CREATININE 1.58* 1.62* 1.40*  CALCIUM 8.2* 8.3* 8.3*  MG  --  2.6* 2.6*   GFR: Estimated Creatinine Clearance: 55.5 mL/min (A) (by C-G formula based on SCr of 1.4 mg/dL (H)). Liver Function Tests: Recent Labs  Lab 10/31/20 1124 11/01/20 0439 11/02/20 0401  AST 77* 51* 34  ALT 32 29 25  ALKPHOS 63 62 65  BILITOT 0.6 0.7 0.5  PROT 8.1 7.5 7.7  ALBUMIN 3.5 3.2* 3.4*   No results for input(s): LIPASE, AMYLASE in the last 168 hours. No results for input(s): AMMONIA in the last 168 hours. Coagulation Profile: No results for input(s): INR, PROTIME in the last 168 hours. Cardiac Enzymes: No results for input(s): CKTOTAL, CKMB, CKMBINDEX, TROPONINI in the last 168 hours. BNP (last 3 results) No results for input(s): PROBNP in the last 8760 hours. HbA1C: Recent Labs    10/31/20 1124  HGBA1C 7.9*   CBG: Recent Labs  Lab 11/01/20 0731 11/01/20 1122 11/01/20 1654 11/01/20 2147 11/02/20 0748  GLUCAP 190* 164* 257* 286* 195*   Lipid Profile: Recent Labs    10/31/20 1124  TRIG 100   Thyroid Function Tests: No results for input(s): TSH, T4TOTAL, FREET4, T3FREE, THYROIDAB in the last 72 hours. Anemia Panel: Recent Labs    10/31/20 1124 10/31/20 1124 11/01/20 0439 11/02/20 0401  FERRITIN 71   < > 67 62  TIBC 315  --   --   --   IRON 23*  --   --   --    < > = values in this interval not displayed.   Urine analysis:    Component Value Date/Time   COLORURINE YELLOW 05/23/2020 0042   APPEARANCEUR CLEAR 05/23/2020 0042   LABSPEC 1.025 06/29/2020 1524   PHURINE 5.5 06/29/2020 1524   GLUCOSEU NEGATIVE 06/29/2020 1524   HGBUR NEGATIVE 06/29/2020 1524   BILIRUBINUR NEGATIVE 06/29/2020 1524   BILIRUBINUR negative  02/10/2018 1020   BILIRUBINUR negative 07/01/2015 1108   [place] 06/29/2020 1524   PROTEINUR NEGATIVE 06/29/2020 1524   UROBILINOGEN 0.2 06/29/2020 1524   NITRITE NEGATIVE 06/29/2020 1524   LEUKOCYTESUR NEGATIVE 06/29/2020 1524   Sepsis Labs: Invalid input(s): PROCALCITONIN, LACTICIDVEN  Recent Results (from the past 240 hour(s))  Resp Panel by RT PCR (RSV, Flu A&B, Covid) - Nasopharyngeal Swab     Status: Abnormal   Collection Time: 10/27/20  3:20 PM   Specimen: Nasopharyngeal Swab; Nasopharyngeal(NP) swabs in  vial transport medium  Result Value Ref Range Status   SARS Coronavirus 2 by RT PCR POSITIVE (A) NEGATIVE Final    Comment: RESULT CALLED TO, READ BACK BY AND VERIFIED WITH: Rosalyn Charters RN 10/27/20 AT 2303 SK (NOTE) SARS-CoV-2 target nucleic acids are DETECTED.  SARS-CoV-2 RNA is generally detectable in upper respiratory specimens  during the acute phase of infection. Positive results are indicative of the presence of the identified virus, but do not rule out bacterial infection or co-infection with other pathogens not detected by the test. Clinical correlation with patient history and other diagnostic information is necessary to determine patient infection status. The expected result is Negative.  Fact Sheet for Patients:  PinkCheek.be  Fact Sheet for Healthcare Providers: GravelBags.it  This test is not yet approved or cleared by the [place] FDA and  has been authorized for detection and/or diagnosis of SARS-CoV-2 by FDA under an Emergency Use Authorization (EUA).  This EUA will remain in effect (meaning this test can be u sed) for the duration of  the COVID-19 declaration under Section 564(b)(1) of the Act, 21 U.S.C. section 360bbb-3(b)(1), unless the authorization is terminated or revoked sooner.      Influenza A by PCR NEGATIVE NEGATIVE Final   Influenza B by PCR NEGATIVE NEGATIVE Final     Comment: (NOTE) The Xpert Xpress SARS-CoV-2/FLU/RSV assay is intended as an aid in  the diagnosis of influenza from Nasopharyngeal swab specimens and  should not be used as a sole basis for treatment. Nasal washings and  aspirates are unacceptable for Xpert Xpress SARS-CoV-2/FLU/RSV  testing.  Fact Sheet for Patients: PinkCheek.be  Fact Sheet for Healthcare Providers: GravelBags.it  This test is not yet approved or cleared by the [place] FDA and  has been authorized for detection and/or diagnosis of SARS-CoV-2 by  FDA under an Emergency Use Authorization (EUA). This EUA will remain  in effect (meaning this test can be used) for the duration of the  Covid-19 declaration under Section 564(b)(1) of the Act, 21  U.S.C. section 360bbb-3(b)(1), unless the authorization is  terminated or revoked.    Respiratory Syncytial Virus by PCR NEGATIVE NEGATIVE Final    Comment: (NOTE) Fact Sheet for Patients: PinkCheek.be  Fact Sheet for Healthcare Providers: GravelBags.it  This test is not yet approved or cleared by the [place] FDA and  has been authorized for detection and/or diagnosis of SARS-CoV-2 by  FDA under an Emergency Use Authorization (EUA). This EUA will remain  in effect (meaning this test can be used) for the duration of the  COVID-19 declaration under Section 564(b)(1) of the Act, 21 U.S.C.  section 360bbb-3(b)(1), unless the authorization is terminated or  revoked. Performed at [facility], [place] [address], [place], [place] [address]   Blood Culture (routine x 2)     Status: None (Preliminary result)   Collection Time: 10/31/20 11:24 AM   Specimen: BLOOD  Result Value Ref Range Status   Specimen Description   Final    BLOOD LEFT ANTECUBITAL Performed at [facility] [address] [place], [place] [address]     Special Requests   Final    BOTTLES DRAWN AEROBIC AND ANAEROBIC Blood Culture results may not be optimal due to an excessive volume of blood received in culture bottles Performed at [facility] [address] [place], [place] [address]    Culture   Final    NO GROWTH 2 DAYS Performed at [facility], [address], [place], [place] [address]    Report Status PENDING  Incomplete  Blood Culture (routine x 2)     Status: None (Preliminary result)   Collection Time: 10/31/20 11:30 AM   Specimen: BLOOD  Result Value Ref Range Status   Specimen Description   Final    BLOOD RIGHT ANTECUBITAL Performed at [facility] [address], [place], [place] [address]    Special Requests   Final    BOTTLES DRAWN AEROBIC AND ANAEROBIC Blood Culture results may not be optimal due to an excessive volume of blood received in culture bottles Performed at [facility] [address], [place], [place] [address]    Culture   Final    NO GROWTH 2 DAYS Performed at [facility] [address], [place], [place] [address]    Report Status PENDING  Incomplete      Radiology Studies: DG Chest Port 1 View  Result Date: 10/31/2020 CLINICAL DATA:  COVID.  Shortness of breath. EXAM: PORTABLE CHEST 1 VIEW COMPARISON:  Chest radiograph 06/15/2019. FINDINGS: The heart size and mediastinal contours are within normal limits. Low lung volumes. Dense retrocardiac opacity. No visible pleural effusions or pneumothorax. The visualized skeletal structures are unremarkable. IMPRESSION: Low lung volumes with dense retrocardiac opacity, concerning for pneumonia. Electronically Signed   By: Margaretha Sheffield MD   On: 10/31/2020 11:25    Marzetta Board, MD, PhD [facility]  Between 7 am - 7 pm I am available, please contact me via Amion or Securechat  Between 7 pm - 7 am I am not available, please contact night coverage MD/APP via Amion

## 2020-11-03 DIAGNOSIS — J9601 Acute respiratory failure with hypoxia: Secondary | ICD-10-CM | POA: Diagnosis not present

## 2020-11-03 LAB — COMPREHENSIVE METABOLIC PANEL
ALT: 25 U/L (ref 0–44)
AST: 28 U/L (ref 15–41)
Albumin: 3.1 g/dL — ABNORMAL LOW (ref 3.5–5.0)
Alkaline Phosphatase: 62 U/L (ref 38–126)
Anion gap: 11 (ref 5–15)
BUN: 24 mg/dL — ABNORMAL HIGH (ref 6–20)
CO2: 24 mmol/L (ref 22–32)
Calcium: 8.4 mg/dL — ABNORMAL LOW (ref 8.9–10.3)
Chloride: 103 mmol/L (ref 98–111)
Creatinine, Ser: 1.31 mg/dL — ABNORMAL HIGH (ref 0.44–1.00)
GFR, Estimated: 49 mL/min — ABNORMAL LOW (ref >60)
Glucose, Bld: 161 mg/dL — ABNORMAL HIGH (ref 70–99)
Potassium: 3.4 mmol/L — ABNORMAL LOW (ref 3.5–5.1)
Sodium: 138 mmol/L (ref 135–145)
Total Bilirubin: 0.4 mg/dL (ref 0.3–1.2)
Total Protein: 7.2 g/dL (ref 6.5–8.1)

## 2020-11-03 LAB — CBC WITH DIFFERENTIAL/PLATELET
Abs Immature Granulocytes: 0.08 10*3/uL — ABNORMAL HIGH (ref 0.00–0.07)
Basophils Absolute: 0 10*3/uL (ref 0.0–0.1)
Basophils Relative: 0 %
Eosinophils Absolute: 0 10*3/uL (ref 0.0–0.5)
Eosinophils Relative: 0 %
HCT: 31.2 % — ABNORMAL LOW (ref 36.0–46.0)
Hemoglobin: 9.4 g/dL — ABNORMAL LOW (ref 12.0–15.0)
Immature Granulocytes: 1 %
Lymphocytes Relative: 29 %
Lymphs Abs: 2.2 10*3/uL (ref 0.7–4.0)
MCH: 26 pg (ref 26.0–34.0)
MCHC: 30.1 g/dL (ref 30.0–36.0)
MCV: 86.4 fL (ref 80.0–100.0)
Monocytes Absolute: 0.6 10*3/uL (ref 0.1–1.0)
Monocytes Relative: 8 %
Neutro Abs: 4.8 10*3/uL (ref 1.7–7.7)
Neutrophils Relative %: 62 %
Platelets: 351 10*3/uL (ref 150–400)
RBC: 3.61 MIL/uL — ABNORMAL LOW (ref 3.87–5.11)
RDW: 16.5 % — ABNORMAL HIGH (ref 11.5–15.5)
WBC: 7.7 10*3/uL (ref 4.0–10.5)
nRBC: 0 % (ref 0.0–0.2)

## 2020-11-03 LAB — MAGNESIUM: Magnesium: 2.2 mg/dL (ref 1.7–2.4)

## 2020-11-03 LAB — FERRITIN: Ferritin: 51 ng/mL (ref 11–307)

## 2020-11-03 LAB — GLUCOSE, CAPILLARY
Glucose-Capillary: 145 mg/dL — ABNORMAL HIGH (ref 70–99)
Glucose-Capillary: 163 mg/dL — ABNORMAL HIGH (ref 70–99)
Glucose-Capillary: 380 mg/dL — ABNORMAL HIGH (ref 70–99)
Glucose-Capillary: 226 mg/dL — ABNORMAL HIGH (ref 70–99)

## 2020-11-03 LAB — C-REACTIVE PROTEIN: CRP: 2.7 mg/dL — ABNORMAL HIGH (ref <1.0)

## 2020-11-03 LAB — D-DIMER, QUANTITATIVE: D-Dimer, Quant: 0.84 ug/mL-FEU — ABNORMAL HIGH (ref 0.00–0.50)

## 2020-11-03 NOTE — Progress Notes (Signed)
Inpatient Diabetes Program Recommendations  AACE/ADA: New Consensus Statement on Inpatient Glycemic Control   Target Ranges:  Prepandial:   less than 140 mg/dL      Peak postprandial:   less than 180 mg/dL (1-2 hours)      Critically ill patients:  140 - 180 mg/dL   Results for Brandi Gomez, Brandi Gomez (MRN [id number]) as of 11/03/2020 09:10  Ref. Range 11/02/2020 07:48 11/02/2020 11:23 11/02/2020 16:54 11/02/2020 20:22 11/03/2020 07:32  Glucose-Capillary Latest Ref Range: 70 - 99 mg/dL 195 (H) 276 (H) 372 (H) 276 (H) 145 (H)   Review of Glycemic Control  Diabetes history: DM2 Outpatient Diabetes medications: Glipizide 5 mg BID, Januvia 50 mg daily Current orders for Inpatient glycemic control: Novolog 0-15 units TID with meals, Novolog 0-5 units QHS; Decadron 6 mg Q24H  Inpatient Diabetes Program Recommendations:    Insulin: If steroids are continued, please consider ordering Novolog 6 units TID with meals for meal coverage if patient eats at least 50% of meals.  Thanks, Barnie Alderman, RN, MSN, CDE Diabetes Coordinator Inpatient Diabetes Program [phone] (Team Pager from 8am to 5pm)

## 2020-11-03 NOTE — Progress Notes (Signed)
PROGRESS NOTE  Brandi Gomez LEX:[id number] DOB: [birth year] DOA: 10/31/2020 PCP: Horald Pollen, MD   LOS: 3 days   Brief Narrative / Interim history: 49 year old female with history of epilepsy, HTN, DM 2, came to the hospital with shortness of breath.  This was started last week with cough and congestion.  She was found to be Covid positive as an outpatient, her dyspnea worsened and came to the hospital on 11/23.  Subjective / 24h Interval events: Feeling better, asking about going home.  Still on oxygen.  Assessment & Plan:  Principal Problem Acute Hypoxic Respiratory Failure due to Covid-19 Viral Illness -Patient remains hypoxic requiring 2 L nasal cannula to maintain sats in the 90s.  She was started on remdesivir and steroids, continue -Continue steroids with Decadron. Remains on 2 L, wean off as tolerated -Last day of Remdesivir 11/27, if stable discharge home tomorrow   COVID-19 Labs  Recent Labs    10/31/20 1124 10/31/20 1124 11/01/20 0439 11/02/20 0401 11/03/20 0722  DDIMER 1.36*   < > 0.82* 0.73* 0.84*  FERRITIN 71   < > 67 62 51  LDH 360*  --   --   --   --   CRP 10.1*   < > 7.6* 4.7* 2.7*   < > = values in this interval not displayed.    Lab Results  Component Value Date   [place] (A) 10/27/2020   [place] NEGATIVE 11/11/2019   [place] NEGATIVE 10/12/2019   [place] NOT DETECTED 06/15/2019    Active Problems History of epilepsy -Continue home medications  Hyperlipidemia -Continue statin  Essential hypertension -Continue Coreg, blood pressure stable  DM2 -Hold home oral agents, placed on sliding scale, monitor CBGs while on steroids  CBG (last 3)  Recent Labs    11/02/20 1654 11/02/20 2022 11/03/20 0732  GLUCAP 372* 276* 145*    Scheduled Meds: . ARIPiprazole  5 mg Oral Daily  . vitamin C  500 mg Oral Daily  . atorvastatin  40 mg Oral q1800  . carvedilol  3.125 mg Oral BID  . desipramine  25 mg Oral  QHS  . dexamethasone (DECADRON) injection  6 mg Intravenous Q24H  . enoxaparin (LOVENOX) injection  50 mg Subcutaneous Q24H  . insulin aspart  0-15 Units Subcutaneous TID WC  . insulin aspart  0-5 Units Subcutaneous QHS  . Ipratropium-Albuterol  1 puff Inhalation BID  . lamoTRIgine  100 mg Oral BID  . zinc sulfate  220 mg Oral Daily   Continuous Infusions: . remdesivir 100 mg in NS 100 mL 100 mg (11/03/20 1009)   PRN Meds:.acetaminophen, guaiFENesin-dextromethorphan, ondansetron **OR** ondansetron (ZOFRAN) IV  DVT prophylaxis: Lovenox Code Status: Full code Family Communication: no family present  Status is: Inpatient  Remains inpatient appropriate because:Inpatient level of care appropriate due to severity of illness  Dispo: The patient is from: Home              Anticipated d/c is to: Home              Anticipated d/c date is: 3 days              Patient currently is not medically stable to d/c.  Consultants:  None   Procedures:  None   Microbiology: None   Antibacterials: None    Objective: Vitals:   11/02/20 2341 11/03/20 0045 11/03/20 0100 11/03/20 0456  BP:    130/86  Pulse:    73  Resp:  18  Temp:    98.3 F (36.8 C)  TempSrc:      SpO2: 90% 95% 92% 100%  Weight:      Height:        Intake/Output Summary (Last 24 hours) at 11/03/2020 1012 Last data filed at 11/03/2020 0851 Gross per 24 hour  Intake 834 ml  Output 1380 ml  Net -546 ml   Filed Weights   10/31/20 1044  Weight: 113.4 kg    Examination:  Constitutional: NAD, in bed Eyes: No scleral icterus ENMT: Moist mucous membranes Neck: normal, supple Respiratory: Overall clear, no wheezing or crackles, moves air well Cardiovascular: Regular rate and rhythm, no murmurs, no edema Abdomen: Nondistended, bowel sounds positive Musculoskeletal: no clubbing / cyanosis.  Skin: No rashes seen Neurologic: No focal deficits  Data Reviewed: I have independently reviewed following labs and  imaging studies   CBC: Recent Labs  Lab 10/31/20 1124 11/01/20 0439 11/02/20 0401 11/03/20 0722  WBC 4.9 2.8* 4.7 7.7  NEUTROABS 3.6 2.0 3.3 4.8  HGB 9.6* 9.6* 9.6* 9.4*  HCT 31.8* 31.5* 32.6* 31.2*  MCV 88.3 87.5 88.1 86.4  PLT 238 254 306 619   Basic Metabolic Panel: Recent Labs  Lab 10/31/20 1124 11/01/20 0439 11/02/20 0401 11/03/20 0722  NA 139 138 136 138  K 4.0 4.5 4.1 3.4*  CL 103 104 104 103  CO2 27 24 21* 24  GLUCOSE 165* 195* 241* 161*  BUN 15 20 25* 24*  CREATININE 1.58* 1.62* 1.40* 1.31*  CALCIUM 8.2* 8.3* 8.3* 8.4*  MG  --  2.6* 2.6* 2.2   GFR: Estimated Creatinine Clearance: 59.4 mL/min (A) (by C-G formula based on SCr of 1.31 mg/dL (H)). Liver Function Tests: Recent Labs  Lab 10/31/20 1124 11/01/20 0439 11/02/20 0401 11/03/20 0722  AST 77* 51* 34 28  ALT 32 29 25 25   ALKPHOS 63 62 65 62  BILITOT 0.6 0.7 0.5 0.4  PROT 8.1 7.5 7.7 7.2  ALBUMIN 3.5 3.2* 3.4* 3.1*   No results for input(s): LIPASE, AMYLASE in the last 168 hours. No results for input(s): AMMONIA in the last 168 hours. Coagulation Profile: No results for input(s): INR, PROTIME in the last 168 hours. Cardiac Enzymes: No results for input(s): CKTOTAL, CKMB, CKMBINDEX, TROPONINI in the last 168 hours. BNP (last 3 results) No results for input(s): PROBNP in the last 8760 hours. HbA1C: Recent Labs    10/31/20 1124  HGBA1C 7.9*   CBG: Recent Labs  Lab 11/02/20 0748 11/02/20 1123 11/02/20 1654 11/02/20 2022 11/03/20 0732  GLUCAP 195* 276* 372* 276* 145*   Lipid Profile: Recent Labs    10/31/20 1124  TRIG 100   Thyroid Function Tests: No results for input(s): TSH, T4TOTAL, FREET4, T3FREE, THYROIDAB in the last 72 hours. Anemia Panel: Recent Labs    10/31/20 1124 11/01/20 0439 11/02/20 0401 11/03/20 0722  FERRITIN 71   < > 62 51  TIBC 315  --   --   --   IRON 23*  --   --   --    < > = values in this interval not displayed.   Urine analysis:    Component  Value Date/Time   COLORURINE YELLOW 05/23/2020 [place] 05/23/2020 0042   LABSPEC 1.025 06/29/2020 1524   PHURINE 5.5 06/29/2020 1524   GLUCOSEU NEGATIVE 06/29/2020 1524   HGBUR NEGATIVE 06/29/2020 1524   BILIRUBINUR NEGATIVE 06/29/2020 1524   BILIRUBINUR negative 02/10/2018 1020   BILIRUBINUR negative 07/01/2015  [place] 06/29/2020 [place] 06/29/2020 1524   UROBILINOGEN 0.2 06/29/2020 1524   NITRITE NEGATIVE 06/29/2020 1524   LEUKOCYTESUR NEGATIVE 06/29/2020 1524   Sepsis Labs: Invalid input(s): PROCALCITONIN, LACTICIDVEN  Recent Results (from the past 240 hour(s))  Resp Panel by RT PCR (RSV, Flu A&B, Covid) - Nasopharyngeal Swab     Status: Abnormal   Collection Time: 10/27/20  3:20 PM   Specimen: Nasopharyngeal Swab; Nasopharyngeal(NP) swabs in vial transport medium  Result Value Ref Range Status   SARS Coronavirus 2 by RT PCR POSITIVE (A) NEGATIVE Final    Comment: RESULT CALLED TO, READ BACK BY AND VERIFIED WITH: Rosalyn Charters RN 10/27/20 AT 2303 SK (NOTE) SARS-CoV-2 target nucleic acids are DETECTED.  SARS-CoV-2 RNA is generally detectable in upper respiratory specimens  during the acute phase of infection. Positive results are indicative of the presence of the identified virus, but do not rule out bacterial infection or co-infection with other pathogens not detected by the test. Clinical correlation with patient history and other diagnostic information is necessary to determine patient infection status. The expected result is Negative.  Fact Sheet for Patients:  PinkCheek.be  Fact Sheet for Healthcare Providers: GravelBags.it  This test is not yet approved or cleared by the [place] FDA and  has been authorized for detection and/or diagnosis of SARS-CoV-2 by FDA under an Emergency Use Authorization (EUA).  This EUA will remain in effect (meaning this  test can be u sed) for the duration of  the COVID-19 declaration under Section 564(b)(1) of the Act, 21 U.S.C. section 360bbb-3(b)(1), unless the authorization is terminated or revoked sooner.      Influenza A by PCR NEGATIVE NEGATIVE Final   Influenza B by PCR NEGATIVE NEGATIVE Final    Comment: (NOTE) The Xpert Xpress SARS-CoV-2/FLU/RSV assay is intended as an aid in  the diagnosis of influenza from Nasopharyngeal swab specimens and  should not be used as a sole basis for treatment. Nasal washings and  aspirates are unacceptable for Xpert Xpress SARS-CoV-2/FLU/RSV  testing.  Fact Sheet for Patients: PinkCheek.be  Fact Sheet for Healthcare Providers: GravelBags.it  This test is not yet approved or cleared by the [place] FDA and  has been authorized for detection and/or diagnosis of SARS-CoV-2 by  FDA under an Emergency Use Authorization (EUA). This EUA will remain  in effect (meaning this test can be used) for the duration of the  Covid-19 declaration under Section 564(b)(1) of the Act, 21  U.S.C. section 360bbb-3(b)(1), unless the authorization is  terminated or revoked.    Respiratory Syncytial Virus by PCR NEGATIVE NEGATIVE Final    Comment: (NOTE) Fact Sheet for Patients: PinkCheek.be  Fact Sheet for Healthcare Providers: GravelBags.it  This test is not yet approved or cleared by the [place] FDA and  has been authorized for detection and/or diagnosis of SARS-CoV-2 by  FDA under an Emergency Use Authorization (EUA). This EUA will remain  in effect (meaning this test can be used) for the duration of the  COVID-19 declaration under Section 564(b)(1) of the Act, 21 U.S.C.  section 360bbb-3(b)(1), unless the authorization is terminated or  revoked. Performed at [facility], [place] [address] [place], [place] [address]   Blood Culture  (routine x 2)     Status: None (Preliminary result)   Collection Time: 10/31/20 11:24 AM   Specimen: BLOOD  Result Value Ref Range Status   Specimen Description   Final  BLOOD LEFT ANTECUBITAL Performed at [facility] [address], [place], [place] [address]    Special Requests   Final    BOTTLES DRAWN AEROBIC AND ANAEROBIC Blood Culture results may not be optimal due to an excessive volume of blood received in culture bottles Performed at [facility] [address], [place], [place] [address]    Culture   Final    NO GROWTH 2 DAYS Performed at [facility] [address] [place], [place] [address]    Report Status PENDING  Incomplete  Blood Culture (routine x 2)     Status: None (Preliminary result)   Collection Time: 10/31/20 11:30 AM   Specimen: BLOOD  Result Value Ref Range Status   Specimen Description   Final    BLOOD RIGHT ANTECUBITAL Performed at [facility] [address], [place], [place] [address]    Special Requests   Final    BOTTLES DRAWN AEROBIC AND ANAEROBIC Blood Culture results may not be optimal due to an excessive volume of blood received in culture bottles Performed at [facility] [address], [place], [place] [address]    Culture   Final    NO GROWTH 2 DAYS Performed at [facility] [address] [place], [place] [address]    Report Status PENDING  Incomplete      Radiology Studies: No results found.  Marzetta Board, MD, PhD [facility]  Between 7 am - 7 pm I am available, please contact me via Amion or Securechat  Between 7 pm - 7 am I am not available, please contact night coverage MD/APP via Amion

## 2020-11-03 NOTE — Plan of Care (Signed)

## 2020-11-04 DIAGNOSIS — J9601 Acute respiratory failure with hypoxia: Secondary | ICD-10-CM | POA: Diagnosis not present

## 2020-11-04 LAB — GLUCOSE, CAPILLARY
Glucose-Capillary: 155 mg/dL — ABNORMAL HIGH (ref 70–99)
Glucose-Capillary: 207 mg/dL — ABNORMAL HIGH (ref 70–99)

## 2020-11-04 LAB — CBC WITH DIFFERENTIAL/PLATELET
Abs Immature Granulocytes: 0.11 10*3/uL — ABNORMAL HIGH (ref 0.00–0.07)
Basophils Absolute: 0 10*3/uL (ref 0.0–0.1)
Basophils Relative: 0 %
Eosinophils Absolute: 0 10*3/uL (ref 0.0–0.5)
Eosinophils Relative: 0 %
HCT: 31.2 % — ABNORMAL LOW (ref 36.0–46.0)
Hemoglobin: 9.5 g/dL — ABNORMAL LOW (ref 12.0–15.0)
Immature Granulocytes: 1 %
Lymphocytes Relative: 25 %
Lymphs Abs: 1.9 10*3/uL (ref 0.7–4.0)
MCH: 26.2 pg (ref 26.0–34.0)
MCHC: 30.4 g/dL (ref 30.0–36.0)
MCV: 86.2 fL (ref 80.0–100.0)
Monocytes Absolute: 0.7 10*3/uL (ref 0.1–1.0)
Monocytes Relative: 8 %
Neutro Abs: 5 10*3/uL (ref 1.7–7.7)
Neutrophils Relative %: 66 %
Platelets: 378 10*3/uL (ref 150–400)
RBC: 3.62 MIL/uL — ABNORMAL LOW (ref 3.87–5.11)
RDW: 16.3 % — ABNORMAL HIGH (ref 11.5–15.5)
WBC: 7.7 10*3/uL (ref 4.0–10.5)
nRBC: 0 % (ref 0.0–0.2)

## 2020-11-04 LAB — COMPREHENSIVE METABOLIC PANEL
ALT: 25 U/L (ref 0–44)
AST: 26 U/L (ref 15–41)
Albumin: 2.9 g/dL — ABNORMAL LOW (ref 3.5–5.0)
Alkaline Phosphatase: 64 U/L (ref 38–126)
Anion gap: 9 (ref 5–15)
BUN: 22 mg/dL — ABNORMAL HIGH (ref 6–20)
CO2: 25 mmol/L (ref 22–32)
Calcium: 8.2 mg/dL — ABNORMAL LOW (ref 8.9–10.3)
Chloride: 103 mmol/L (ref 98–111)
Creatinine, Ser: 1.18 mg/dL — ABNORMAL HIGH (ref 0.44–1.00)
GFR, Estimated: 56 mL/min — ABNORMAL LOW (ref >60)
Glucose, Bld: 207 mg/dL — ABNORMAL HIGH (ref 70–99)
Potassium: 3.9 mmol/L (ref 3.5–5.1)
Sodium: 137 mmol/L (ref 135–145)
Total Bilirubin: 0.5 mg/dL (ref 0.3–1.2)
Total Protein: 6.9 g/dL (ref 6.5–8.1)

## 2020-11-04 LAB — C-REACTIVE PROTEIN: CRP: 4.1 mg/dL — ABNORMAL HIGH (ref <1.0)

## 2020-11-04 LAB — FERRITIN: Ferritin: 163 ng/mL (ref 11–307)

## 2020-11-04 LAB — D-DIMER, QUANTITATIVE: D-Dimer, Quant: 0.55 ug/mL-FEU — ABNORMAL HIGH (ref 0.00–0.50)

## 2020-11-04 LAB — MAGNESIUM: Magnesium: 2.1 mg/dL (ref 1.7–2.4)

## 2020-11-04 MED ORDER — DEXAMETHASONE 6 MG PO TABS: 6.0000 mg | ORAL_TABLET | Freq: Every day | ORAL | 0 refills | Status: AC

## 2020-11-04 MED ORDER — GUAIFENESIN-DM 100-10 MG/5ML PO SYRP: 10.0000 mL | ORAL_SOLUTION | ORAL | 0 refills | Status: DC | PRN

## 2020-11-04 NOTE — Progress Notes (Signed)
Patient will be discharging this afternoon with family. Belongings were returned to patient. Education on oxygen equipment and meds will be provided.

## 2020-11-04 NOTE — Discharge Summary (Signed)
Physician Discharge Summary  Brandi Gomez HWE:[id number] DOB: 06/16/1971 DOA: 10/31/2020  PCP: Brandi Pollen, MD  Admit date: 10/31/2020 Discharge date: 11/04/2020  Admitted From: home Disposition:  home  Recommendations for Outpatient Follow-up:  1. Follow up with PCP in 1-2 weeks  Home Health: None Equipment/Devices: Home oxygen, 2 L nasal cannula  Discharge Condition: Stable CODE STATUS: Full code Diet recommendation: Regular diet  HPI: Per admitting MD, Brandi Gomez is a 49 y.o. female with medical history significant of epilepsy, HTN, DM2. Presenting with shortness of breath. Poor historian. Hx from chart review. Started last week with cough and congestion. She tried OTC meds, but they did not help. She went to Urgent Care and was found to be COVID +. She was sent for MAB. Over the weekend, her dyspnea worsened. PO intake worsened. Fatigue increased. OTC medicines did not help. She became concerned and came to the ED.    Hospital Course / Discharge diagnoses: Principal Problem Acute Hypoxic Respiratory Failure due to Covid-19 Viral Illness -Patient was admitted to the hospital with COVID-19 pneumonia, she was placed on remdesivir and steroids, clinically improved and subjectively returned to baseline.  She is able to ambulate without difficulties, no further shortness of breath however she is requiring 2 L upon discharge.  She finished 5 days of Remdesivir while hospitalized and is to continue 5 additional days of Decadron upon discharge for total of 10-day course.    Active Problems History of epilepsy -Continue home medications Hyperlipidemia -Continue statin Essential hypertension -continue home medications DM2 -continue home medications, A1c 7.9, she would benefit from better control as an outpatient  Discharge Instructions   Allergies as of 11/04/2020      Reactions   Amoxicillin Itching   Did it involve swelling of the face/tongue/throat, SOB,  or low BP? No Did it involve sudden or severe rash/hives, skin peeling, or any reaction on the inside of your mouth or nose? No Did you need to seek medical attention at a hospital or doctor's office? No When did it last happen?within the past 10 years If all above answers are "NO", may proceed with cephalosporin use.   Lisinopril Swelling   Angioedema    Hydrocodone Other (See Comments)   Depressed    Tegretol [carbamazepine] Swelling   Throat swells      Medication List    STOP taking these medications   azithromycin 250 MG tablet Commonly known as: ZITHROMAX   dextromethorphan-guaiFENesin 30-600 MG 12hr tablet Commonly known as: MUCINEX DM Replaced by: guaiFENesin-dextromethorphan 100-10 MG/5ML syrup     TAKE these medications   acetaminophen 325 MG tablet Commonly known as: TYLENOL Take 2 tablets (650 mg total) by mouth every 4 (four) hours as needed for mild pain (or temp > 37.5 C (99.5 F)).   ARIPiprazole 5 MG tablet Commonly known as: ABILIFY 1 qam What changed:   how much to take  how to take this  when to take this  additional instructions   atorvastatin 40 MG tablet Commonly known as: LIPITOR TAKE 1 TABLET (40 MG TOTAL) BY MOUTH DAILY AT 6 PM FOR CHOLESTEROL What changed: See the new instructions.   carvedilol 3.125 MG tablet Commonly known as: COREG Take 3.125 mg by mouth 2 (two) times daily. What changed: Another medication with the same name was removed. Continue taking this medication, and follow the directions you see here.   desipramine 25 MG tablet Commonly known as: Norpramin 1  qam What changed:  how much to take  how to take this  when to take this  additional instructions   dexamethasone 6 MG tablet Commonly known as: DECADRON Take 1 tablet (6 mg total) by mouth daily for 5 days.   glipiZIDE 5 MG tablet Commonly known as: GLUCOTROL Take 1 tablet (5 mg total) by mouth 2 (two) times daily with a meal.    guaiFENesin-dextromethorphan 100-10 MG/5ML syrup Commonly known as: ROBITUSSIN DM Take 10 mLs by mouth every 4 (four) hours as needed for cough. Replaces: dextromethorphan-guaiFENesin 30-600 MG 12hr tablet   hydrALAZINE 10 MG tablet Commonly known as: APRESOLINE Take 1 tablet (10 mg total) by mouth 2 (two) times daily.   ipratropium 0.06 % nasal spray Commonly known as: ATROVENT Place 2 sprays into both nostrils 3 (three) times daily as needed for rhinitis.   isosorbide dinitrate 10 MG tablet Commonly known as: ISORDIL Take 1 tablet (10 mg total) by mouth 2 (two) times daily.   lamoTRIgine 100 MG tablet Commonly known as: LAMICTAL Take 100 mg by mouth 2 (two) times daily. What changed: Another medication with the same name was removed. Continue taking this medication, and follow the directions you see here.   sitaGLIPtin 50 MG tablet Commonly known as: Januvia Take 1 tablet (50 mg total) by mouth daily.   torsemide 10 MG tablet Commonly known as: DEMADEX Take 10 mg by mouth daily.            Durable Medical Equipment  (From admission, onward)         Start     Ordered   11/04/20 1024  For home use only DME oxygen  Once       Question Answer Comment  Length of Need 6 Months   Mode or (Route) Nasal cannula   Liters per Minute 2   Frequency Continuous (stationary and portable oxygen unit needed)   Oxygen delivery system Gas      11/04/20 1023           Consultations:  None   Procedures/Studies:  DG Chest Port 1 View  Result Date: 10/31/2020 CLINICAL DATA:  COVID.  Shortness of breath. EXAM: PORTABLE CHEST 1 VIEW COMPARISON:  Chest radiograph 06/15/2019. FINDINGS: The heart size and mediastinal contours are within normal limits. Low lung volumes. Dense retrocardiac opacity. No visible pleural effusions or pneumothorax. The visualized skeletal structures are unremarkable. IMPRESSION: Low lung volumes with dense retrocardiac opacity, concerning for  pneumonia. Electronically Signed   By: Margaretha Sheffield MD   On: 10/31/2020 11:25     Subjective: - no chest pain, shortness of breath, no abdominal pain, nausea or vomiting.   Discharge Exam: BP 109/65 (BP Location: Left Arm)   Pulse 83   Temp 97.8 F (36.6 C) (Oral)   Resp 15   Ht 5\' 1"  (1.549 m)   Wt 113.4 kg   LMP 10/16/2020 (Approximate)   SpO2 97%   BMI 47.24 kg/m   General: Pt is alert, awake, not in acute distress Cardiovascular: RRR, S1/S2 +, no rubs, no gallops Respiratory: CTA bilaterally, no wheezing, no rhonchi Abdominal: Soft, NT, ND, bowel sounds + Extremities: no edema, no cyanosis    The results of significant diagnostics from this hospitalization (including imaging, microbiology, ancillary and laboratory) are listed below for reference.     Microbiology: Recent Results (from the past 240 hour(s))  Resp Panel by RT PCR (RSV, Flu A&B, Covid) - Nasopharyngeal Swab     Status: Abnormal   Collection Time:  10/27/20  3:20 PM   Specimen: Nasopharyngeal Swab; Nasopharyngeal(NP) swabs in vial transport medium  Result Value Ref Range Status   SARS Coronavirus 2 by RT PCR POSITIVE (A) NEGATIVE Final    Comment: RESULT CALLED TO, READ BACK BY AND VERIFIED WITH: Rosalyn Charters RN 10/27/20 AT 2303 SK (NOTE) SARS-CoV-2 target nucleic acids are DETECTED.  SARS-CoV-2 RNA is generally detectable in upper respiratory specimens  during the acute phase of infection. Positive results are indicative of the presence of the identified virus, but do not rule out bacterial infection or co-infection with other pathogens not detected by the test. Clinical correlation with patient history and other diagnostic information is necessary to determine patient infection status. The expected result is Negative.  Fact Sheet for Patients:  PinkCheek.be  Fact Sheet for Healthcare Providers: GravelBags.it  This test is not yet  approved or cleared by the [place] FDA and  has been authorized for detection and/or diagnosis of SARS-CoV-2 by FDA under an Emergency Use Authorization (EUA).  This EUA will remain in effect (meaning this test can be u sed) for the duration of  the COVID-19 declaration under Section 564(b)(1) of the Act, 21 U.S.C. section 360bbb-3(b)(1), unless the authorization is terminated or revoked sooner.      Influenza A by PCR NEGATIVE NEGATIVE Final   Influenza B by PCR NEGATIVE NEGATIVE Final    Comment: (NOTE) The Xpert Xpress SARS-CoV-2/FLU/RSV assay is intended as an aid in  the diagnosis of influenza from Nasopharyngeal swab specimens and  should not be used as a sole basis for treatment. Nasal washings and  aspirates are unacceptable for Xpert Xpress SARS-CoV-2/FLU/RSV  testing.  Fact Sheet for Patients: PinkCheek.be  Fact Sheet for Healthcare Providers: GravelBags.it  This test is not yet approved or cleared by the [place] FDA and  has been authorized for detection and/or diagnosis of SARS-CoV-2 by  FDA under an Emergency Use Authorization (EUA). This EUA will remain  in effect (meaning this test can be used) for the duration of the  Covid-19 declaration under Section 564(b)(1) of the Act, 21  U.S.C. section 360bbb-3(b)(1), unless the authorization is  terminated or revoked.    Respiratory Syncytial Virus by PCR NEGATIVE NEGATIVE Final    Comment: (NOTE) Fact Sheet for Patients: PinkCheek.be  Fact Sheet for Healthcare Providers: GravelBags.it  This test is not yet approved or cleared by the [place] FDA and  has been authorized for detection and/or diagnosis of SARS-CoV-2 by  FDA under an Emergency Use Authorization (EUA). This EUA will remain  in effect (meaning this test can be used) for the duration of the  COVID-19 declaration under  Section 564(b)(1) of the Act, 21 U.S.C.  section 360bbb-3(b)(1), unless the authorization is terminated or  revoked. Performed at [facility], [place] [address] [place], [place] [address]   Blood Culture (routine x 2)     Status: None (Preliminary result)   Collection Time: 10/31/20 11:24 AM   Specimen: BLOOD  Result Value Ref Range Status   Specimen Description   Final    BLOOD LEFT ANTECUBITAL Performed at [facility] [address] [place], [place] [address]    Special Requests   Final    BOTTLES DRAWN AEROBIC AND ANAEROBIC Blood Culture results may not be optimal due to an excessive volume of blood received in culture bottles Performed at [facility] [address] [place], [place] [address]    Culture   Final  NO GROWTH 4 DAYS Performed at [facility], [place] [address], [place], [place] [address]    Report Status PENDING  Incomplete  Blood Culture (routine x 2)     Status: None (Preliminary result)   Collection Time: 10/31/20 11:30 AM   Specimen: BLOOD  Result Value Ref Range Status   Specimen Description   Final    BLOOD RIGHT ANTECUBITAL Performed at [facility] [address] [place], [place] [address]    Special Requests   Final    BOTTLES DRAWN AEROBIC AND ANAEROBIC Blood Culture results may not be optimal due to an excessive volume of blood received in culture bottles Performed at [facility] [address] [place], [place] [address]    Culture   Final    NO GROWTH 4 DAYS Performed at [facility], [place] [address] [place], [place] [address]    Report Status PENDING  Incomplete     Labs: Basic Metabolic Panel: Recent Labs  Lab 10/31/20 1124 11/01/20 0439 11/02/20 0401 11/03/20 0722 11/04/20 0422  NA 139 138 136 138 137  K 4.0 4.5 4.1 3.4* 3.9  CL 103 104 104 103 103  CO2 27 24 21* 24 25  GLUCOSE 165* 195* 241* 161* 207*  BUN 15 20 25* 24* 22*   CREATININE 1.58* 1.62* 1.40* 1.31* 1.18*  CALCIUM 8.2* 8.3* 8.3* 8.4* 8.2*  MG  --  2.6* 2.6* 2.2 2.1   Liver Function Tests: Recent Labs  Lab 10/31/20 1124 11/01/20 0439 11/02/20 0401 11/03/20 0722 11/04/20 0422  AST 77* 51* 34 28 26  ALT 32 29 25 25 25   ALKPHOS 63 62 65 62 64  BILITOT 0.6 0.7 0.5 0.4 0.5  PROT 8.1 7.5 7.7 7.2 6.9  ALBUMIN 3.5 3.2* 3.4* 3.1* 2.9*   CBC: Recent Labs  Lab 10/31/20 1124 11/01/20 0439 11/02/20 0401 11/03/20 0722 11/04/20 0422  WBC 4.9 2.8* 4.7 7.7 7.7  NEUTROABS 3.6 2.0 3.3 4.8 5.0  HGB 9.6* 9.6* 9.6* 9.4* 9.5*  HCT 31.8* 31.5* 32.6* 31.2* 31.2*  MCV 88.3 87.5 88.1 86.4 86.2  PLT 238 254 306 351 378   CBG: Recent Labs  Lab 11/03/20 0732 11/03/20 1130 11/03/20 1633 11/03/20 2139 11/04/20 0719  GLUCAP 145* 163* 380* 226* 155*   Hgb A1c No results for input(s): HGBA1C in the last 72 hours. Lipid Profile No results for input(s): CHOL, HDL, LDLCALC, TRIG, CHOLHDL, LDLDIRECT in the last 72 hours. Thyroid function studies No results for input(s): TSH, T4TOTAL, T3FREE, THYROIDAB in the last 72 hours.  Invalid input(s): FREET3 Urinalysis    Component Value Date/Time   COLORURINE YELLOW 05/23/2020 0042   APPEARANCEUR CLEAR 05/23/2020 0042   LABSPEC 1.025 06/29/2020 1524   PHURINE 5.5 06/29/2020 1524   GLUCOSEU NEGATIVE 06/29/2020 1524   HGBUR NEGATIVE 06/29/2020 1524   BILIRUBINUR NEGATIVE 06/29/2020 1524   BILIRUBINUR negative 02/10/2018 1020   BILIRUBINUR negative 07/01/2015 1108   [place] 06/29/2020 1524   PROTEINUR NEGATIVE 06/29/2020 1524   UROBILINOGEN 0.2 06/29/2020 1524   NITRITE NEGATIVE 06/29/2020 1524   LEUKOCYTESUR NEGATIVE 06/29/2020 1524    FURTHER DISCHARGE INSTRUCTIONS:   Get Medicines reviewed and adjusted: Please take all your medications with you for your next visit with your Primary MD   Laboratory/radiological data: Please request your Primary MD to go over all hospital tests and  procedure/radiological results at the follow up, please ask your Primary MD to get all Hospital records sent  to his/her office.   In some cases, they will be blood work, cultures and biopsy results pending at the time of your discharge. Please request that your primary care M.D. goes through all the records of your hospital data and follows up on these results.   Also Note the following: If you experience worsening of your admission symptoms, develop shortness of breath, life threatening emergency, suicidal or homicidal thoughts you must seek medical attention immediately by calling 911 or calling your MD immediately  if symptoms less severe.   You must read complete instructions/literature along with all the possible adverse reactions/side effects for all the Medicines you take and that have been prescribed to you. Take any new Medicines after you have completely understood and accpet all the possible adverse reactions/side effects.    Do not drive when taking Pain medications or sleeping medications (Benzodaizepines)   Do not take more than prescribed Pain, Sleep and Anxiety Medications. It is not advisable to combine anxiety,sleep and pain medications without talking with your primary care practitioner   Special Instructions: If you have smoked or chewed Tobacco  in the last 2 yrs please stop smoking, stop any regular Alcohol  and or any Recreational drug use.   Wear Seat belts while driving.   Please note: You were cared for by a hospitalist during your hospital stay. Once you are discharged, your primary care physician will handle any further medical issues. Please note that NO REFILLS for any discharge medications will be authorized once you are discharged, as it is imperative that you return to your primary care physician (or establish a relationship with a primary care physician if you do not have one) for your post hospital discharge needs so that they can reassess your need for  medications and monitor your lab values.  Time coordinating discharge: 35 minutes  SIGNED:  Marzetta Board, MD, PhD 11/04/2020, 10:55 AM

## 2020-11-04 NOTE — TOC Progression Note (Signed)
Transition of Care [facility]) - Progression Note    Patient Details  Name: Brandi Gomez MRN: [id number] Date of Birth: 13-Sep-1971  Transition of [facility] CM/SW Contact  Joaquin Courts, RN Phone Number: 11/04/2020, 1:57 PM  Clinical Narrative:    [employer] given referral for home oxygen, travel tank to be delivered to bedside.   Expected Discharge Plan: Home/Self Care Barriers to Discharge: No Barriers Identified  Expected Discharge Plan and Services Expected Discharge Plan: Home/Self Care   Discharge Planning Services: CM Consult   Living arrangements for the past 2 months: Single Family Home Expected Discharge Date: 11/04/20               DME Arranged: Oxygen DME Agency: [employer] Date DME Agency Contacted: 11/04/20 Time DME Agency Contacted: [phone] Representative spoke with at DME Agency: Lucrecia             Social Determinants of Health ([place]) Interventions    Readmission Risk Interventions No flowsheet data found.

## 2020-11-04 NOTE — Progress Notes (Signed)
SATURATION QUALIFICATIONS: (This note is used to comply with regulatory documentation for home oxygen)  Patient Saturations on Room Air at Rest = 86%  Patient Saturations on Room Air while Ambulating = 84%  Patient Saturations on 2 Liters of oxygen while Ambulating = 97%  Please briefly explain why patient needs home oxygen: Patient becomes short of breath while ambulating.

## 2020-11-05 LAB — CULTURE, BLOOD (ROUTINE X 2)
Culture: NO GROWTH
Culture: NO GROWTH

## 2020-11-06 ENCOUNTER — Other Ambulatory Visit: Payer: Self-pay

## 2020-11-06 NOTE — Progress Notes (Deleted)
PATIENT: Brandi Gomez DOB: [birth year]  REASON FOR VISIT: follow up HISTORY FROM: patient  HISTORY OF PRESENT ILLNESS: Today 11/06/20  HISTORY   REVIEW OF SYSTEMS: Out of a complete 14 system review of symptoms, the patient complains only of the following symptoms, and all other reviewed systems are negative.  ALLERGIES: Allergies  Allergen Reactions  . Amoxicillin Itching    Did it involve swelling of the face/tongue/throat, SOB, or low BP? No Did it involve sudden or severe rash/hives, skin peeling, or any reaction on the inside of your mouth or nose? No Did you need to seek medical attention at a hospital or doctor's office? No When did it last happen?within the past 10 years If all above answers are "NO", may proceed with cephalosporin use.   Marland Kitchen Lisinopril Swelling    Angioedema   . Hydrocodone Other (See Comments)    Depressed   . Tegretol [Carbamazepine] Swelling    Throat swells    HOME MEDICATIONS: Outpatient Medications Prior to Visit  Medication Sig Dispense Refill  . acetaminophen (TYLENOL) 325 MG tablet Take 2 tablets (650 mg total) by mouth every 4 (four) hours as needed for mild pain (or temp > 37.5 C (99.5 F)).    Marland Kitchen ARIPiprazole (ABILIFY) 5 MG tablet 1 qam (Patient taking differently: Take 5 mg by mouth daily. ) 30 tablet 3  . atorvastatin (LIPITOR) 40 MG tablet TAKE 1 TABLET (40 MG TOTAL) BY MOUTH DAILY AT 6 PM FOR CHOLESTEROL 90 tablet 0  . carvedilol (COREG) 3.125 MG tablet Take 3.125 mg by mouth 2 (two) times daily.    Marland Kitchen desipramine (NORPRAMIN) 25 MG tablet 1  qam (Patient taking differently: Take 25 mg by mouth at bedtime. ) 30 tablet 3  . dexamethasone (DECADRON) 6 MG tablet Take 1 tablet (6 mg total) by mouth daily for 5 days. 5 tablet 0  . glipiZIDE (GLUCOTROL) 5 MG tablet Take 1 tablet (5 mg total) by mouth 2 (two) times daily with a meal. 180 tablet 1  . guaiFENesin-dextromethorphan (ROBITUSSIN DM) 100-10 MG/5ML syrup Take 10 mLs by  mouth every 4 (four) hours as needed for cough. 118 mL 0  . hydrALAZINE (APRESOLINE) 10 MG tablet Take 1 tablet (10 mg total) by mouth 2 (two) times daily. 180 tablet 3  . ipratropium (ATROVENT) 0.06 % nasal spray Place 2 sprays into both nostrils 3 (three) times daily as needed for rhinitis. 15 mL 12  . isosorbide dinitrate (ISORDIL) 10 MG tablet Take 1 tablet (10 mg total) by mouth 2 (two) times daily. 180 tablet 3  . lamoTRIgine (LAMICTAL) 100 MG tablet Take 100 mg by mouth 2 (two) times daily.    . sitaGLIPtin (JANUVIA) 50 MG tablet Take 1 tablet (50 mg total) by mouth daily. 90 tablet 3  . torsemide (DEMADEX) 10 MG tablet Take 10 mg by mouth daily.     No facility-administered medications prior to visit.    PAST MEDICAL HISTORY: Past Medical History:  Diagnosis Date  . Anemia   . Anxiety   . Bipolar 1 disorder ([place])   . Common migraine 05/19/2015  . Depression   . Diabetes mellitus, type II ([place])   . Hypertension   . Irritable bowel syndrome (IBS)   . Mild mental retardation   . Obesity   . Partial complex seizure disorder with intractable epilepsy ([place]) 05/12/2014  . Seizures ([place])    intractable, sz 08/23/17  . Sleep apnea   . Stroke ([place])   .  Type II or unspecified type diabetes mellitus without mention of complication, not stated as uncontrolled     PAST SURGICAL HISTORY: Past Surgical History:  Procedure Laterality Date  . BUBBLE STUDY  11/15/2019   Procedure: BUBBLE STUDY;  Surgeon: Dorothy Spark, MD;  Location: [facility];  Service: Cardiovascular;;  . COLONOSCOPY     2012-normal , Dr Sharlett Iles  . ESOPHAGOGASTRODUODENOSCOPY     normal-Dr Patterson 2012  . LOOP RECORDER INSERTION N/A 05/30/2017   Procedure: Loop Recorder Insertion;  Surgeon: Constance Haw, MD;  Location: [facility];  Service: Cardiovascular;  Laterality: N/A;  . LOOP RECORDER REMOVAL N/A 03/04/2018   Procedure: LOOP RECORDER REMOVAL;  Surgeon: Constance Haw, MD;  Location:  [facility];  Service: Cardiovascular;  Laterality: N/A;  . MYRINGOTOMY WITH TUBE PLACEMENT    . MYRINGOTOMY WITH TUBE PLACEMENT Right 11/05/2017   Procedure: MYRINGOTOMY WITH TUBE PLACEMENT;  Surgeon: Melissa Montane, MD;  Location: [facility];  Service: ENT;  Laterality: Right;  right T Tube placement  . NASAL SINUS SURGERY    . TEE WITHOUT CARDIOVERSION N/A 05/30/2017   Procedure: TRANSESOPHAGEAL ECHOCARDIOGRAM (TEE);  Surgeon: Acie Fredrickson Wonda Cheng, MD;  Location: [facility];  Service: Cardiovascular;  Laterality: N/A;  . TEE WITHOUT CARDIOVERSION N/A 11/15/2019   Procedure: TRANSESOPHAGEAL ECHOCARDIOGRAM (TEE);  Surgeon: Dorothy Spark, MD;  Location: [facility];  Service: Cardiovascular;  Laterality: N/A;    FAMILY HISTORY: Family History  Problem Relation Age of Onset  . Diabetes Mother        passed away from accidental death  . Hypertension Mother   . Bipolar disorder Father   . Diabetes Daughter   . Leukemia Daughter   . Ovarian cancer Maternal Aunt     SOCIAL HISTORY: Social History   Socioeconomic History  . Marital status: Single    Spouse name: [name]  . Number of children: 3  . Years of education: 90  . Highest education level: Not on file  Occupational History  . Occupation: disabled    Employer: DISABLED  Tobacco Use  . Smoking status: Never Smoker  . Smokeless tobacco: Never Used  Vaping Use  . Vaping Use: Never used  Substance and Sexual Activity  . Alcohol use: No  . Drug use: No  . Sexual activity: Never  Other Topics Concern  . Not on file  Social History Narrative   Patient lives at home with daughter.    Patient has 3 children.    Patient is right handed.    Patient has a high school education.    Patient is on disability   Patient drinks 2 cups of caffeine daily.   Social Determinants of Health   Financial Resource Strain:   . Difficulty of Paying Living Expenses: Not on file  Food Insecurity:   . Worried About Charity fundraiser in  the Last Year: Not on file  . Ran Out of Food in the Last Year: Not on file  Transportation Needs:   . Lack of Transportation (Medical): Not on file  . Lack of Transportation (Non-Medical): Not on file  Physical Activity:   . Days of Exercise per Week: Not on file  . Minutes of Exercise per Session: Not on file  Stress:   . Feeling of Stress : Not on file  Social Connections:   . Frequency of Communication with Friends and Family: Not on file  . Frequency of Social Gatherings with Friends and Family: Not  on file  . Attends Religious Services: Not on file  . Active Member of Clubs or Organizations: Not on file  . Attends Archivist Meetings: Not on file  . Marital Status: Not on file  Intimate Partner Violence:   . Fear of Current or Ex-Partner: Not on file  . Emotionally Abused: Not on file  . Physically Abused: Not on file  . Sexually Abused: Not on file      PHYSICAL EXAM  There were no vitals filed for this visit. There is no height or weight on file to calculate BMI.  Generalized: Well developed, in no acute distress   Neurological examination  Mentation: Alert oriented to time, place, history taking. Follows all commands speech and language fluent Cranial nerve II-XII: Pupils were equal round reactive to light. Extraocular movements were full, visual field were full on confrontational test. Facial sensation and strength were normal. Uvula tongue midline. Head turning and shoulder shrug  were normal and symmetric. Motor: The motor testing reveals 5 over 5 strength of all 4 extremities. Good symmetric motor tone is noted throughout.  Sensory: Sensory testing is intact to soft touch on all 4 extremities. No evidence of extinction is noted.  Coordination: Cerebellar testing reveals good finger-nose-finger and heel-to-shin bilaterally.  Gait and station: Gait is normal. Tandem gait is normal. Romberg is negative. No drift is seen.  Reflexes: Deep tendon reflexes are  symmetric and normal bilaterally.   DIAGNOSTIC DATA (LABS, IMAGING, TESTING) - I reviewed patient records, labs, notes, testing and imaging myself where available.  Lab Results  Component Value Date   WBC 7.7 11/04/2020   HGB 9.5 (L) 11/04/2020   HCT 31.2 (L) 11/04/2020   MCV 86.2 11/04/2020   PLT 378 11/04/2020      Component Value Date/Time   NA 137 11/04/2020 0422   NA 136 08/29/2020 0955   K 3.9 11/04/2020 0422   CL 103 11/04/2020 0422   CO2 25 11/04/2020 0422   GLUCOSE 207 (H) 11/04/2020 0422   BUN 22 (H) 11/04/2020 0422   BUN 19 08/29/2020 0955   CREATININE 1.18 (H) 11/04/2020 0422   CREATININE 0.72 01/30/2015 1241   CALCIUM 8.2 (L) 11/04/2020 0422   PROT 6.9 11/04/2020 0422   PROT 6.6 08/29/2020 0955   ALBUMIN 2.9 (L) 11/04/2020 0422   ALBUMIN 3.6 (L) 08/29/2020 0955   AST 26 11/04/2020 0422   ALT 25 11/04/2020 0422   ALKPHOS 64 11/04/2020 0422   BILITOT 0.5 11/04/2020 0422   BILITOT <0.2 08/29/2020 0955   GFRNONAA 56 (L) 11/04/2020 0422   GFRNONAA >89 01/30/2015 1241   GFRAA 51 (L) 08/29/2020 0955   GFRAA >89 01/30/2015 1241   Lab Results  Component Value Date   CHOL 108 04/18/2020   HDL 52 04/18/2020   LDLCALC 44 04/18/2020   TRIG 100 10/31/2020   CHOLHDL 2.1 04/18/2020   Lab Results  Component Value Date   HGBA1C 7.9 (H) 10/31/2020   Lab Results  Component Value Date   VITAMINB12 379 05/13/2014   Lab Results  Component Value Date   TSH 1.220 02/10/2018      ASSESSMENT AND PLAN 49 y.o. year old female  has a past medical history of Anemia, Anxiety, Bipolar 1 disorder ([place]), Common migraine (05/19/2015), Depression, Diabetes mellitus, type II ([place]), Hypertension, Irritable bowel syndrome (IBS), Mild mental retardation, Obesity, Partial complex seizure disorder with intractable epilepsy ([place]) (05/12/2014), Seizures ([place]), Sleep apnea, Stroke ([place]), and Type II or unspecified type diabetes  mellitus without mention of complication, not stated as  uncontrolled. here with ***   I spent 15 minutes with the patient. 50% of this time was spent   Butler Denmark, [place], DNP 11/06/2020, 9:39 AM [facility] [address], [name], [place] [address] [phone]

## 2020-11-06 NOTE — Patient Outreach (Signed)
[name]) Care Management  [place]  11/06/2020   Brandi Gomez Apr 16, 1971 [id number]  Subjective: Telephone call to patient after recent hospitalization sue to COVID-19. Patient reports she feels a lot better.  She has all medications. She states her daughter makes sure she takes her medications.  Patient has follow up appointment s scheduled.  She denies problems with transportation. Discussed with patient COVID-19 and worsening. Patient using oxygen as needed as she states she does not feel short of breath now.  Also discussed quarantine with patient. She verbalized understanding.   Objective:   Encounter Medications:  Outpatient Encounter Medications as of 11/06/2020  Medication Sig Note  . acetaminophen (TYLENOL) 325 MG tablet Take 2 tablets (650 mg total) by mouth every 4 (four) hours as needed for mild pain (or temp > 37.5 C (99.5 F)). 10/31/2020: .  Marland Kitchen ARIPiprazole (ABILIFY) 5 MG tablet 1 qam (Patient taking differently: Take 5 mg by mouth daily. )   . atorvastatin (LIPITOR) 40 MG tablet TAKE 1 TABLET (40 MG TOTAL) BY MOUTH DAILY AT 6 PM FOR CHOLESTEROL   . carvedilol (COREG) 3.125 MG tablet Take 3.125 mg by mouth 2 (two) times daily.   Marland Kitchen desipramine (NORPRAMIN) 25 MG tablet 1  qam (Patient taking differently: Take 25 mg by mouth at bedtime. )   . dexamethasone (DECADRON) 6 MG tablet Take 1 tablet (6 mg total) by mouth daily for 5 days.   Marland Kitchen glipiZIDE (GLUCOTROL) 5 MG tablet Take 1 tablet (5 mg total) by mouth 2 (two) times daily with a meal.   . guaiFENesin-dextromethorphan (ROBITUSSIN DM) 100-10 MG/5ML syrup Take 10 mLs by mouth every 4 (four) hours as needed for cough.   Marland Kitchen ipratropium (ATROVENT) 0.06 % nasal spray Place 2 sprays into both nostrils 3 (three) times daily as needed for rhinitis.   Marland Kitchen lamoTRIgine (LAMICTAL) 100 MG tablet Take 100 mg by mouth 2 (two) times daily.   . sitaGLIPtin (JANUVIA) 50 MG tablet Take 1 tablet (50 mg total) by mouth  daily.   Marland Kitchen torsemide (DEMADEX) 10 MG tablet Take 10 mg by mouth daily.   . hydrALAZINE (APRESOLINE) 10 MG tablet Take 1 tablet (10 mg total) by mouth 2 (two) times daily. 10/31/2020: .  . isosorbide dinitrate (ISORDIL) 10 MG tablet Take 1 tablet (10 mg total) by mouth 2 (two) times daily.   . [DISCONTINUED] methylphenidate (RITALIN) 5 MG tablet Take 1 tablet (5 mg total) by mouth 2 (two) times daily. (Patient not taking: Reported on 12/29/2019) 11/08/2019: Hasnt started  . [DISCONTINUED] traZODone (DESYREL) 100 MG tablet Take 1 tablet (100 mg total) by mouth at bedtime. (Patient not taking: Reported on 12/29/2019)    No facility-administered encounter medications on file as of 11/06/2020.    Functional Status:  In your present state of health, do you have any difficulty performing the following activities: 11/06/2020 10/31/2020  Hearing? N -  Vision? N -  Difficulty concentrating or making decisions? Y -  Comment some short term memory loss at times -  Walking or climbing stairs? Y Y  Comment some shortness of breath -  Dressing or bathing? N -  Doing errands, shopping? [place] and eating ? N -  Using the Toilet? N -  In the past six months, have you accidently leaked urine? N -  Do you have problems with loss of bowel control? - -  Managing your Medications? Y -  Comment  Daughter helps with medications -  Managing your Finances? N -  Housekeeping or managing your Housekeeping? N -  Some encounter information is confidential and restricted. Go to Review Flowsheets activity to see all data.  Some recent data might be hidden    Fall/Depression Screening: Fall Risk  11/06/2020 08/29/2020 04/18/2020  Falls in the past year? 0 1 0  Number falls in past yr: - 0 -  Injury with Fall? - 0 -  Comment - - -  Follow up - Falls evaluation completed Falls evaluation completed   PHQ 2/9 Scores 11/06/2020 08/29/2020 04/18/2020 04/14/2020 01/04/2020 09/01/2019 08/09/2019  PHQ -  2 Score 0 0 1 1 0 0 0   SDOH Screenings   Alcohol Screen: Low Risk   . Last Alcohol Screening Score (AUDIT): 0  Depression (PHQ2-9): Low Risk   . PHQ-2 Score: 0  Financial Resource Strain:   . Difficulty of Paying Living Expenses: Not on file  Food Insecurity: No Food Insecurity  . Worried About Charity fundraiser in the Last Year: Never true  . Ran Out of Food in the Last Year: Never true  Housing: Low Risk   . Last Housing Risk Score: 0  Physical Activity:   . Days of Exercise per Week: Not on file  . Minutes of Exercise per Session: Not on file  Social Connections:   . Frequency of Communication with Friends and Family: Not on file  . Frequency of Social Gatherings with Friends and Family: Not on file  . Attends Religious Services: Not on file  . Active Member of Clubs or Organizations: Not on file  . Attends Archivist Meetings: Not on file  . Marital Status: Not on file  Stress:   . Feeling of Stress : Not on file  Tobacco Use: Low Risk   . Smoking Tobacco Use: Never Smoker  . Smokeless Tobacco Use: Never Used  Transportation Needs: No Transportation Needs  . Lack of Transportation (Medical): No  . Lack of Transportation (Non-Medical): No     Assessment: Patient currently recovering from recent COVID-19.  Patient managing with assist of family.  Goals Addressed            This Visit's Progress   . THN-Make and Keep All Appointments       Follow Up Date 01/08/21   - arrange a ride through an agency 1 week before appointment - call to cancel if needed - keep a calendar with appointment dates    Why is this important?    Part of staying healthy is seeing the doctor for follow-up care.   If you forget your appointments, there are some things you can do to stay on track.    Notes:     . THN-Protect My Health       Follow Up Date 01/08/21   - schedule appointment for vaccines needed due to my age or health - schedule recommended health tests  (blood work, mammogram, colonoscopy, pap test) - schedule and keep appointment for annual check-up    Why is this important?    Screening tests can find diseases early when they are easier to treat.   Your doctor or nurse will talk with you about which tests are important for you.   Getting shots for common diseases like the flu and shingles will help prevent them.     Notes:     . Track and Manage My Blood Pressure  Follow Up Date  01/08/21   - check blood pressure weekly - write blood pressure results in a log or diary    Why is this important?    You won't feel high blood pressure, but it can still hurt your blood vessels.   High blood pressure can cause heart or kidney problems. It can also cause a stroke.   Making lifestyle changes like losing a little weight or eating less salt will help.   Checking your blood pressure at home and at different times of the day can help to control blood pressure.   If the doctor prescribes medicine remember to take it the way the doctor ordered.   Call the office if you cannot afford the medicine or if there are questions about it.     Notes:        Plan: RN CM will send new welcome letter RN CM will send new barriers letter to physician RN CM will contact patient again in the month of December and patient agreeable.  [name], RN, MSN [facility] Care Management Coordinator Direct Line [phone] Cell [phone] Toll Free: [phone]  Fax: [phone]

## 2020-11-06 NOTE — Patient Instructions (Signed)
Goals Addressed            This Visit's Progress   . THN-Make and Keep All Appointments       Follow Up Date 01/08/21   - arrange a ride through an agency 1 week before appointment - call to cancel if needed - keep a calendar with appointment dates    Why is this important?    Part of staying healthy is seeing the doctor for follow-up care.   If you forget your appointments, there are some things you can do to stay on track.    Notes:     . THN-Protect My Health       Follow Up Date 01/08/21   - schedule appointment for vaccines needed due to my age or health - schedule recommended health tests (blood work, mammogram, colonoscopy, pap test) - schedule and keep appointment for annual check-up    Why is this important?    Screening tests can find diseases early when they are easier to treat.   Your doctor or nurse will talk with you about which tests are important for you.   Getting shots for common diseases like the flu and shingles will help prevent them.     Notes:     . Track and Manage My Blood Pressure       Follow Up Date  01/08/21   - check blood pressure weekly - write blood pressure results in a log or diary    Why is this important?    You won't feel high blood pressure, but it can still hurt your blood vessels.   High blood pressure can cause heart or kidney problems. It can also cause a stroke.   Making lifestyle changes like losing a little weight or eating less salt will help.   Checking your blood pressure at home and at different times of the day can help to control blood pressure.   If the doctor prescribes medicine remember to take it the way the doctor ordered.   Call the office if you cannot afford the medicine or if there are questions about it.     Notes:

## 2020-11-07 ENCOUNTER — Ambulatory Visit: Payer: Medicare HMO | Admitting: Neurology

## 2020-11-07 ENCOUNTER — Other Ambulatory Visit: Payer: Self-pay

## 2020-11-07 NOTE — Patient Outreach (Signed)
[facility]) Care Management  11/07/2020  KALISHA KEADLE 18-Jan-1971 [id number]   EMMI- General Discharge RED ON EMMI ALERT Day # 1 Date: 11/06/20 Red Alert Reason: Scheduled follow up? No  No outreach needed as patient follow up appointment was scheduled in the hospital for PCP.  Appointment discussed with patient on 11/06/20 outreach.     Plan: RN CM will outreach again in the month of December as scheduled.    [name], RN, MSN [facility] Care Management Care Management Coordinator Direct Line [phone] Toll Free: [phone]  Fax: [phone]

## 2020-11-09 DIAGNOSIS — U071 COVID-19: Secondary | ICD-10-CM | POA: Diagnosis not present

## 2020-11-09 DIAGNOSIS — R569 Unspecified convulsions: Secondary | ICD-10-CM | POA: Diagnosis not present

## 2020-11-09 DIAGNOSIS — M545 Low back pain, unspecified: Secondary | ICD-10-CM | POA: Diagnosis not present

## 2020-11-09 DIAGNOSIS — G4733 Obstructive sleep apnea (adult) (pediatric): Secondary | ICD-10-CM | POA: Diagnosis not present

## 2020-11-10 ENCOUNTER — Other Ambulatory Visit: Payer: Self-pay

## 2020-11-10 ENCOUNTER — Ambulatory Visit: Payer: Self-pay

## 2020-11-10 NOTE — Patient Outreach (Signed)
[facility]) Care Management  11/10/2020  [name] 1971-01-28 [id number]   EMMI- General Discharge RED ON EMMI ALERT Day # 4 Date:11/09/20 Red Alert Reason:Scheduled follow up? No  No outreach needed as patient follow up appointment was scheduled in the hospital for PCP.  Appointment discussed with patient on 11/06/20 outreach.     Plan: RN CM will outreach again in the month of December as scheduled.    [name], RN, MSN [place] [facility] Care Management Coordinator Direct Line [phone] Toll Free: [phone]

## 2020-11-12 ENCOUNTER — Encounter: Payer: Self-pay | Admitting: Emergency Medicine

## 2020-11-15 ENCOUNTER — Other Ambulatory Visit: Payer: Self-pay

## 2020-11-15 ENCOUNTER — Telehealth (INDEPENDENT_AMBULATORY_CARE_PROVIDER_SITE_OTHER): Payer: Medicare HMO | Admitting: Psychiatry

## 2020-11-15 DIAGNOSIS — F22 Delusional disorders: Secondary | ICD-10-CM

## 2020-11-15 MED ORDER — ARIPIPRAZOLE 5 MG PO TABS
ORAL_TABLET | ORAL | 7 refills | Status: DC
Start: 2020-11-15 — End: 2021-01-17

## 2020-11-15 NOTE — Progress Notes (Signed)
Psychiatric Initial Adult Assessment   Patient Identification: Brandi Gomez MRN:  [id number] Date of Evaluation:  11/15/2020 Referral Source: Dr. Tory Emerald Chief Complaint:  Suspiciousness/paranoia Visit Diagnosis: Mood disorder secondary to CVA    Today the patient is doing fair.  She was hospitalized for COVID virus for about 1 week.  Her whole household got.  She has been back home for only about a month.  Overall though her mood seems to be good.  She says she felt pretty depressed when she was hospitalized but now her mood is better.  She is sleeping okay.  She no longer demonstrates any really psychotic symptoms.  Her biggest issue is that she is overeating.  Her energy level was fair.  [name] is not clear at this time but he still lives with her.  Noted the patient had a seizure 2 months ago.  This patient has a dementing process probably related to multiple strokes.  She has an upcoming appointment with her neurologist.  At this time she denies paranoia.  She denies hallucinations.  She has been too scared to start the desipramine and did not.  Patient is on a CPAP machine.  She no longer has wishes to leave her setting.  The patient seems to be better.  She seems to be more rational use here to talk to.  She is functioning fairly well. (Hypo) Manic Symptoms:   Anxiety Symptoms:   Psychotic Symptoms:   PTSD Symptoms:   Past Psychiatric History: Cymbalta 30 mg Previous Psychotropic Medications: Cymbalta 30 mg  Substance Abuse History in the last 12 months:  No.  Consequences of Substance Abuse:   Past Medical History:  Past Medical History:  Diagnosis Date  . Anemia   . Anxiety   . Bipolar 1 disorder ([place])   . Common migraine 05/19/2015  . Depression   . Diabetes mellitus, type II ([place])   . Hypertension   . Irritable bowel syndrome (IBS)   . Mild mental retardation   . Obesity   . Partial complex seizure disorder with intractable epilepsy ([place]) 05/12/2014  . Seizures  ([place])    intractable, sz 08/23/17  . Sleep apnea   . Stroke ([place])   . Type II or unspecified type diabetes mellitus without mention of complication, not stated as uncontrolled     Past Surgical History:  Procedure Laterality Date  . BUBBLE STUDY  11/15/2019   Procedure: BUBBLE STUDY;  Surgeon: Dorothy Spark, MD;  Location: [place];  Service: Cardiovascular;;  . COLONOSCOPY     2012-normal , Dr Sharlett Iles  . ESOPHAGOGASTRODUODENOSCOPY     normal-Dr Patterson 2012  . LOOP RECORDER INSERTION N/A 05/30/2017   Procedure: Loop Recorder Insertion;  Surgeon: Constance Haw, MD;  Location: [place] [facility];  Service: Cardiovascular;  Laterality: N/A;  . LOOP RECORDER REMOVAL N/A 03/04/2018   Procedure: LOOP RECORDER REMOVAL;  Surgeon: Constance Haw, MD;  Location: [place] [facility];  Service: Cardiovascular;  Laterality: N/A;  . MYRINGOTOMY WITH TUBE PLACEMENT    . MYRINGOTOMY WITH TUBE PLACEMENT Right 11/05/2017   Procedure: MYRINGOTOMY WITH TUBE PLACEMENT;  Surgeon: Melissa Montane, MD;  Location: [place];  Service: ENT;  Laterality: Right;  right T Tube placement  . NASAL SINUS SURGERY    . TEE WITHOUT CARDIOVERSION N/A 05/30/2017   Procedure: TRANSESOPHAGEAL ECHOCARDIOGRAM (TEE);  Surgeon: Acie Fredrickson Wonda Cheng, MD;  Location: [place];  Service: Cardiovascular;  Laterality: N/A;  . TEE WITHOUT CARDIOVERSION N/A 11/15/2019  Procedure: TRANSESOPHAGEAL ECHOCARDIOGRAM (TEE);  Surgeon: Dorothy Spark, MD;  Location: [facility] END[facility]COPY;  Service: Cardiovascular;  Laterality: N/A;    Family Psychiatric History:   Family History:  Family History  Problem Relation Age of Onset  . Diabetes Mother        passed away from accidental death  . Hypertension Mother   . Bipolar disorder Father   . Diabetes Daughter   . Leukemia Daughter   . Ovarian cancer Maternal Aunt     Social History:   Social History   Socioeconomic History  . Marital status: Single    Spouse name: [name]   . Number of children: 3  . Years of education: 26  . Highest education level: Not on file  Occupational History  . Occupation: disabled    Employer: DISABLED  Tobacco Use  . Smoking status: Never Smoker  . Smokeless tobacco: Never Used  Vaping Use  . Vaping Use: Never used  Substance and Sexual Activity  . Alcohol use: No  . Drug use: No  . Sexual activity: Never  Other Topics Concern  . Not on file  Social History Narrative   Patient lives at home with daughter.    Patient has 3 children.    Patient is right handed.    Patient has a high school education.    Patient is on disability   Patient drinks 2 cups of caffeine daily.   Social Determinants of Health   Financial Resource Strain:   . Difficulty of Paying Living Expenses: Not on file  Food Insecurity: No Food Insecurity  . Worried About Charity fundraiser in the Last Year: Never true  . Ran Out of Food in the Last Year: Never true  Transportation Needs: No Transportation Needs  . Lack of Transportation (Medical): No  . Lack of Transportation (Non-Medical): No  Physical Activity:   . Days of Exercise per Week: Not on file  . Minutes of Exercise per Session: Not on file  Stress:   . Feeling of Stress : Not on file  Social Connections:   . Frequency of Communication with Friends and Family: Not on file  . Frequency of Social Gatherings with Friends and Family: Not on file  . Attends Religious Services: Not on file  . Active Member of Clubs or Organizations: Not on file  . Attends Archivist Meetings: Not on file  . Marital Status: Not on file    Additional Social History:   Allergies:   Allergies  Allergen Reactions  . Amoxicillin Itching    Did it involve swelling of the face/tongue/throat, SOB, or low BP? No Did it involve sudden or severe rash/hives, skin peeling, or any reaction on the inside of your mouth or nose? No Did you need to seek medical attention at a hospital or doctor's office?  No When did it last happen?within the past 10 years If all above answers are "NO", may proceed with cephalosporin use.   Marland Kitchen Lisinopril Swelling    Angioedema   . Hydrocodone Other (See Comments)    Depressed   . Tegretol [Carbamazepine] Swelling    Throat swells    Metabolic Disorder Labs: Lab Results  Component Value Date   HGBA1C 7.9 (H) 10/31/2020   MPG 180.03 10/31/2020   MPG 139.85 10/12/2019   No results found for: PROLACTIN Lab Results  Component Value Date   CHOL 108 04/18/2020   TRIG 100 10/31/2020   HDL 52 04/18/2020   CHOLHDL 2.1  04/18/2020   VLDL 10 10/12/2019   LDLCALC 44 04/18/2020   LDLCALC 64 10/12/2019     Current Medications: Current Outpatient Medications  Medication Sig Dispense Refill  . acetaminophen (TYLENOL) 325 MG tablet Take 2 tablets (650 mg total) by mouth every 4 (four) hours as needed for mild pain (or temp > 37.5 C (99.5 F)).    Marland Kitchen ARIPiprazole (ABILIFY) 5 MG tablet 1 qam 30 tablet 7  . atorvastatin (LIPITOR) 40 MG tablet TAKE 1 TABLET (40 MG TOTAL) BY MOUTH DAILY AT 6 PM FOR CHOLESTEROL 90 tablet 0  . carvedilol (COREG) 3.125 MG tablet Take 3.125 mg by mouth 2 (two) times daily.    Marland Kitchen glipiZIDE (GLUCOTROL) 5 MG tablet Take 1 tablet (5 mg total) by mouth 2 (two) times daily with a meal. 180 tablet 1  . guaiFENesin-dextromethorphan (ROBITUSSIN DM) 100-10 MG/5ML syrup Take 10 mLs by mouth every 4 (four) hours as needed for cough. 118 mL 0  . hydrALAZINE (APRESOLINE) 10 MG tablet Take 1 tablet (10 mg total) by mouth 2 (two) times daily. 180 tablet 3  . ipratropium (ATROVENT) 0.06 % nasal spray Place 2 sprays into both nostrils 3 (three) times daily as needed for rhinitis. 15 mL 12  . isosorbide dinitrate (ISORDIL) 10 MG tablet Take 1 tablet (10 mg total) by mouth 2 (two) times daily. 180 tablet 3  . lamoTRIgine (LAMICTAL) 100 MG tablet Take 100 mg by mouth 2 (two) times daily.    . sitaGLIPtin (JANUVIA) 50 MG tablet Take 1 tablet (50 mg  total) by mouth daily. 90 tablet 3  . torsemide (DEMADEX) 10 MG tablet Take 10 mg by mouth daily.     No current facility-administered medications for this visit.    Neurologic: Headache: No Seizure: Yes Paresthesias:No  Musculoskeletal: Strength & Muscle Tone: within normal limits Gait & Station: normal Patient leans: Backward and N/A  Psychiatric Specialty Exam: ROS  Last menstrual period 10/16/2020.There is no height or weight on file to calculate BMI.  General Appearance: Casual  Eye Contact:  Fair  Speech:  Slurred  Volume:  Normal  Mood:  Dysphoric  Affect:  Appropriate  Thought Process:  Goal Directed  Orientation:  Full (Time, Place, and Person)  Thought Content:  Logical  Suicidal Thoughts:  No  Homicidal Thoughts:  No  Memory:  Negative  Judgement:  Fair  Insight:  Lacking  Psychomotor Activity:  Decreased  Concentration:    Recall:  [place] of Knowledge:Fair  Language: Fair  Akathisia:  No  Handed:    AIMS (if indicated):    Assets:  Desire for Improvement  ADL's:  Intact  Cognition: WNL  Sleep:      Treatment Plan Summary: 12/8/20213:09 PM   The patient's diagnosis is delusional disorder.  At this time she shows little evidence of psychosis.  The possibility in the next few months of stopping her Abilify should be considered.  She never ended up starting the desipramine.  At this time I choose not to restart it.  I would not make any interventions without getting input from [place] her significant other who is not there at this time.  The patient does not appear distressed.  She is agreed to continue taking 5 mg of Abilify.  We will reevaluate her in approximately 4 to 5 months.

## 2020-11-17 ENCOUNTER — Other Ambulatory Visit: Payer: Self-pay

## 2020-11-17 NOTE — Patient Outreach (Signed)
[facility]) Care Management  [place]  11/17/2020   Brandi Gomez 09-Apr-1971 [id number]  Subjective: Telephone call to patient for follow up. Patient reports she is feeling better everyday.  She states that she is still using her oxygen as needed and her CPAP at night.  Patient has not checked her blood pressure. Encouraged patient to check her pressures in order to know if they are under control. Discussed importance of blood pressure management. She verbalized understanding. Patient has PCP appointment on Monday.  She denies problems with transportation.  Discussed COVID and continued recovery.  She verbalized understanding.    Objective:   Encounter Medications:  Outpatient Encounter Medications as of 11/17/2020  Medication Sig Note  . acetaminophen (TYLENOL) 325 MG tablet Take 2 tablets (650 mg total) by mouth every 4 (four) hours as needed for mild pain (or temp > 37.5 C (99.5 F)). 10/31/2020: .  Marland Kitchen ARIPiprazole (ABILIFY) 5 MG tablet 1 qam   . atorvastatin (LIPITOR) 40 MG tablet TAKE 1 TABLET (40 MG TOTAL) BY MOUTH DAILY AT 6 PM FOR CHOLESTEROL   . carvedilol (COREG) 3.125 MG tablet Take 3.125 mg by mouth 2 (two) times daily.   Marland Kitchen glipiZIDE (GLUCOTROL) 5 MG tablet Take 1 tablet (5 mg total) by mouth 2 (two) times daily with a meal.   . guaiFENesin-dextromethorphan (ROBITUSSIN DM) 100-10 MG/5ML syrup Take 10 mLs by mouth every 4 (four) hours as needed for cough.   . hydrALAZINE (APRESOLINE) 10 MG tablet Take 1 tablet (10 mg total) by mouth 2 (two) times daily. 10/31/2020: .  . ipratropium (ATROVENT) 0.06 % nasal spray Place 2 sprays into both nostrils 3 (three) times daily as needed for rhinitis.   Marland Kitchen isosorbide dinitrate (ISORDIL) 10 MG tablet Take 1 tablet (10 mg total) by mouth 2 (two) times daily.   Marland Kitchen lamoTRIgine (LAMICTAL) 100 MG tablet Take 100 mg by mouth 2 (two) times daily.   . sitaGLIPtin (JANUVIA) 50 MG tablet Take 1 tablet (50 mg total) by mouth  daily.   Marland Kitchen torsemide (DEMADEX) 10 MG tablet Take 10 mg by mouth daily.   . [DISCONTINUED] methylphenidate (RITALIN) 5 MG tablet Take 1 tablet (5 mg total) by mouth 2 (two) times daily. (Patient not taking: Reported on 12/29/2019) 11/08/2019: Hasnt started  . [DISCONTINUED] traZODone (DESYREL) 100 MG tablet Take 1 tablet (100 mg total) by mouth at bedtime. (Patient not taking: Reported on 12/29/2019)    No facility-administered encounter medications on file as of 11/17/2020.    Functional Status:  In your present state of health, do you have any difficulty performing the following activities: 11/06/2020 10/31/2020  Hearing? N -  Vision? N -  Difficulty concentrating or making decisions? Y -  Comment some short term memory loss at times -  Walking or climbing stairs? Y Y  Comment some shortness of breath -  Dressing or bathing? N -  Doing errands, shopping? [place] and eating ? N -  Using the Toilet? N -  In the past six months, have you accidently leaked urine? N -  Do you have problems with loss of bowel control? - -  Managing your Medications? Y -  Comment Daughter helps with medications -  Managing your Finances? N -  Housekeeping or managing your Housekeeping? N -  Some encounter information is confidential and restricted. Go to Review Flowsheets activity to see all data.  Some recent data might be hidden  Fall/Depression Screening: Fall Risk  11/06/2020 08/29/2020 04/18/2020  Falls in the past year? 0 1 0  Number falls in past yr: - 0 -  Injury with Fall? - 0 -  Comment - - -  Follow up - Falls evaluation completed Falls evaluation completed   PHQ 2/9 Scores 11/06/2020 08/29/2020 04/18/2020 04/14/2020 01/04/2020 09/01/2019 08/09/2019  PHQ - 2 Score 0 0 1 1 0 0 0    Assessment: Patient continues COVID recovery.  Goals Addressed            This Visit's Progress   . THN-Make and Keep All Appointments   On track    Follow Up Date 01/08/21   -  arrange a ride through an agency 1 week before appointment - call to cancel if needed - keep a calendar with appointment dates    Why is this important?    Part of staying healthy is seeing the doctor for follow-up care.   If you forget your appointments, there are some things you can do to stay on track.    Notes:     . THN-Protect My Health   On track    Follow Up Date 01/08/21   - schedule appointment for vaccines needed due to my age or health - schedule recommended health tests (blood work, mammogram, colonoscopy, pap test) - schedule and keep appointment for annual check-up    Why is this important?    Screening tests can find diseases early when they are easier to treat.   Your doctor or nurse will talk with you about which tests are important for you.   Getting shots for common diseases like the flu and shingles will help prevent them.     Notes:     . Track and Manage My Blood Pressure   On track    Follow Up Date  01/08/21   - check blood pressure weekly - write blood pressure results in a log or diary    Why is this important?    You won't feel high blood pressure, but it can still hurt your blood vessels.   High blood pressure can cause heart or kidney problems. It can also cause a stroke.   Making lifestyle changes like losing a little weight or eating less salt will help.   Checking your blood pressure at home and at different times of the day can help to control blood pressure.   If the doctor prescribes medicine remember to take it the way the doctor ordered.   Call the office if you cannot afford the medicine or if there are questions about it.     Notes:        Plan: RN CM will contact patient again in the month of December.   Follow-up:  Patient agrees to Care Plan and Follow-up.   [name], RN, MSN [facility] Care Management Coordinator Direct Line [phone] Cell [phone] Toll Free: [phone]  Fax:  [phone]

## 2020-11-17 NOTE — Patient Instructions (Addendum)
Goals Addressed            This Visit's Progress   . THN-Make and Keep All Appointments   On track    Follow Up Date 01/08/21   - arrange a ride through an agency 1 week before appointment - call to cancel if needed - keep a calendar with appointment dates    Why is this important?    Part of staying healthy is seeing the doctor for follow-up care.   If you forget your appointments, there are some things you can do to stay on track.    Notes:     . THN-Protect My Health   On track    Follow Up Date 01/08/21   - schedule appointment for vaccines needed due to my age or health - schedule recommended health tests (blood work, mammogram, colonoscopy, pap test) - schedule and keep appointment for annual check-up    Why is this important?    Screening tests can find diseases early when they are easier to treat.   Your doctor or nurse will talk with you about which tests are important for you.   Getting shots for common diseases like the flu and shingles will help prevent them.     Notes:     . Track and Manage My Blood Pressure   On track    Follow Up Date  01/08/21   - check blood pressure weekly - write blood pressure results in a log or diary    Why is this important?    You won't feel high blood pressure, but it can still hurt your blood vessels.   High blood pressure can cause heart or kidney problems. It can also cause a stroke.   Making lifestyle changes like losing a little weight or eating less salt will help.   Checking your blood pressure at home and at different times of the day can help to control blood pressure.   If the doctor prescribes medicine remember to take it the way the doctor ordered.   Call the office if you cannot afford the medicine or if there are questions about it.     Notes:        10 Things You Can Do to Manage Your COVID-19 Symptoms at Home If you have possible or confirmed COVID-19: 1. Stay home from work and school. And  stay away from other public places. If you must go out, avoid using any kind of public transportation, ridesharing, or taxis. 2. Monitor your symptoms carefully. If your symptoms get worse, call your healthcare provider immediately. 3. Get rest and stay hydrated. 4. If you have a medical appointment, call the healthcare provider ahead of time and tell them that you have or may have COVID-19. 5. For medical emergencies, call 911 and notify the dispatch personnel that you have or may have COVID-19. 6. Cover your cough and sneezes with a tissue or use the inside of your elbow. 7. Wash your hands often with soap and water for at least 20 seconds or clean your hands with an alcohol-based hand sanitizer that contains at least 60% alcohol. 8. As much as possible, stay in a specific room and away from other people in your home. Also, you should use a separate bathroom, if available. If you need to be around other people in or outside of the home, wear a mask. 9. Avoid sharing personal items with other people in your household, like dishes, towels, and bedding. 10. Clean all  surfaces that are touched often, like counters, tabletops, and doorknobs. Use household cleaning sprays or wipes according to the label instructions. [email or web address] 06/09/2019 This information is not intended to replace advice given to you by your health care provider. Make sure you discuss any questions you have with your health care provider. Document Revised: 11/11/2019 Document Reviewed: 11/11/2019 Elsevier Patient Education  [place].

## 2020-11-20 ENCOUNTER — Ambulatory Visit: Payer: Self-pay

## 2020-11-20 ENCOUNTER — Other Ambulatory Visit: Payer: Self-pay

## 2020-11-20 ENCOUNTER — Telehealth: Payer: Self-pay | Admitting: *Deleted

## 2020-11-20 ENCOUNTER — Ambulatory Visit (INDEPENDENT_AMBULATORY_CARE_PROVIDER_SITE_OTHER): Payer: Medicare HMO | Admitting: Emergency Medicine

## 2020-11-20 ENCOUNTER — Encounter: Payer: Self-pay | Admitting: Emergency Medicine

## 2020-11-20 VITALS — BP 133/76 | HR 99 | Temp 97.4°F | Resp 16 | Ht 61.0 in | Wt 305.0 lb

## 2020-11-20 DIAGNOSIS — N1831 Chronic kidney disease, stage 3a: Secondary | ICD-10-CM

## 2020-11-20 DIAGNOSIS — E1159 Type 2 diabetes mellitus with other circulatory complications: Secondary | ICD-10-CM | POA: Diagnosis not present

## 2020-11-20 DIAGNOSIS — I5042 Chronic combined systolic (congestive) and diastolic (congestive) heart failure: Secondary | ICD-10-CM | POA: Diagnosis not present

## 2020-11-20 DIAGNOSIS — F22 Delusional disorders: Secondary | ICD-10-CM | POA: Diagnosis not present

## 2020-11-20 DIAGNOSIS — I152 Hypertension secondary to endocrine disorders: Secondary | ICD-10-CM | POA: Diagnosis not present

## 2020-11-20 DIAGNOSIS — Z09 Encounter for follow-up examination after completed treatment for conditions other than malignant neoplasm: Secondary | ICD-10-CM | POA: Diagnosis not present

## 2020-11-20 DIAGNOSIS — Z6841 Body Mass Index (BMI) 40.0 and over, adult: Secondary | ICD-10-CM

## 2020-11-20 DIAGNOSIS — I69934 Monoplegia of upper limb following unspecified cerebrovascular disease affecting left non-dominant side: Secondary | ICD-10-CM | POA: Diagnosis not present

## 2020-11-20 LAB — POCT GLYCOSYLATED HEMOGLOBIN (HGB A1C): Hemoglobin A1C: 8.9 % — AB (ref 4.0–5.6)

## 2020-11-20 MED ORDER — TRULICITY 0.75 MG/0.5ML ~~LOC~~ SOAJ: 0.7500 mg | SUBCUTANEOUS | 5 refills | Status: DC

## 2020-11-20 NOTE — Progress Notes (Signed)
Brandi Gomez 49 y.o.   Chief Complaint  Patient presents with  . Hospitalization Follow-up    Per patient after having Covid 19    Physician Discharge Summary  KODEE DRURY HYH:[id number] DOB: 1971-08-18 DOA: 10/31/2020  PCP: Horald Pollen, MD  Admit date: 10/31/2020 Discharge date: 11/04/2020  Admitted From: home Disposition:  home  Recommendations for Outpatient Follow-up:  1. Follow up with PCP in 1-2 weeks  Home Health: None Equipment/Devices: Home oxygen, 2 L nasal cannula  Discharge Condition: Stable CODE STATUS: Full code Diet recommendation: Regular diet  HPI: Per admitting MD, Brandi Gomez a 49 y.o.femalewith medical history significant ofepilepsy, HTN, DM2. Presenting with shortness of breath. Poor historian. Hx from chart review. Started last week with cough and congestion. She tried OTC meds, but they did not help. She went to Urgent Care and was found to be COVID +. She was sent for MAB. Over the weekend, her dyspnea worsened. PO intake worsened. Fatigue increased. OTC medicines did not help. She became concerned and came to the ED.  Hospital Course / Discharge diagnoses: Principal Problem Acute Hypoxic Respiratory Failure due to Covid-19 Viral Illness -Patient was admitted to the hospital with COVID-19 pneumonia, she was placed on remdesivir and steroids, clinically improved and subjectively returned to baseline.  She is able to ambulate without difficulties, no further shortness of breath however she is requiring 2 L upon discharge.  She finished 5 days of Remdesivir while hospitalized and is to continue 5 additional days of Decadron upon discharge for total of 10-day course.    Active Problems History of epilepsy -Continue home medications Hyperlipidemia -Continue statin Essential hypertension -continue home medications DM2 -continue home medications, A1c 7.9, she would benefit from better control as an  outpatient   HISTORY OF PRESENT ILLNESS: This is a 49 y.o. female with multiple chronic medical problems here for follow-up of hospitalization on 10/31/2020 when she was admitted with Covid pneumonia.  Discharged on 11/04/2020.  Was treated with remdesivir and corticosteroids.  Required oxygen supplementation for some time but none now.  Much improved.  Today has no complaints or medical concerns. Diabetes has been uncontrolled with last A1c at 7.9.  Presently on glipizide and was started on Januvia.  May be better control with once weekly Trulicity.  Demonstration pen used with patient today.  Did well. BP Readings from Last 3 Encounters:  11/20/20 133/76  11/04/20 114/69  10/28/20 120/68   Lab Results  Component Value Date   HGBA1C 7.9 (H) 10/31/2020     Last assessment and plan from office visit 08/29/2020: ASSESSMENT & PLAN: Hypertension associated with diabetes ([place]) Uncontrolled diabetes with hemoglobin A1c at 8.5.  I am uncertain about patient's compliance with medication.  Needs to be on glipizide 5 mg twice a day.  Patient prefers noninjectables.  Patient's kidney function is compromised.  Will start Januvia 50 mg daily.  Weight becoming an issue.  Will refer for medical nutrition evaluation. Blood pressure elevated.  Recommend to increase carvedilol to 6.25 mg twice daily. Follow-up in 3 months. HPI   Prior to Admission medications   Medication Sig Start Date End Date Taking? Authorizing Provider  acetaminophen (TYLENOL) 325 MG tablet Take 2 tablets (650 mg total) by mouth every 4 (four) hours as needed for mild pain (or temp > 37.5 C (99.5 F)). 10/19/19  Yes Angiulli, Lavon Paganini, PA-C  ARIPiprazole (ABILIFY) 5 MG tablet 1 qam 11/15/20  Yes Plovsky, Berneta Sages, MD  atorvastatin (LIPITOR) 40 MG  tablet TAKE 1 TABLET (40 MG TOTAL) BY MOUTH DAILY AT 6 PM FOR CHOLESTEROL 11/02/20  Yes Trust Crago, Ines Bloomer, MD  carvedilol (COREG) 3.125 MG tablet Take 3.125 mg by mouth 2 (two) times  daily. 09/23/20  Yes [provider]  glipiZIDE (GLUCOTROL) 5 MG tablet Take 1 tablet (5 mg total) by mouth 2 (two) times daily with a meal. 08/29/20 11/27/20 Yes Nada Godley, Ines Bloomer, MD  lamoTRIgine (LAMICTAL) 100 MG tablet Take 100 mg by mouth 2 (two) times daily.   Yes [provider]  sitaGLIPtin (JANUVIA) 50 MG tablet Take 1 tablet (50 mg total) by mouth daily. 08/29/20 11/27/20 Yes Kesler Wickham, Ines Bloomer, MD  torsemide (DEMADEX) 10 MG tablet Take 10 mg by mouth daily.   Yes [provider]  guaiFENesin-dextromethorphan (ROBITUSSIN DM) 100-10 MG/5ML syrup Take 10 mLs by mouth every 4 (four) hours as needed for cough. Patient not taking: Reported on 11/20/2020 11/04/20   Caren Griffins, MD  hydrALAZINE (APRESOLINE) 10 MG tablet Take 1 tablet (10 mg total) by mouth 2 (two) times daily. 01/04/20 10/31/20  Horald Pollen, MD  ipratropium (ATROVENT) 0.06 % nasal spray Place 2 sprays into both nostrils 3 (three) times daily as needed for rhinitis. Patient not taking: Reported on 11/20/2020 10/27/20   Augusto Gamble B, NP  isosorbide dinitrate (ISORDIL) 10 MG tablet Take 1 tablet (10 mg total) by mouth 2 (two) times daily. 01/04/20 10/31/20  Horald Pollen, MD  methylphenidate (RITALIN) 5 MG tablet Take 1 tablet (5 mg total) by mouth 2 (two) times daily. Patient not taking: Reported on 12/29/2019 10/29/19 01/01/20  Izora Ribas, MD  traZODone (DESYREL) 100 MG tablet Take 1 tablet (100 mg total) by mouth at bedtime. Patient not taking: Reported on 12/29/2019 10/19/19 01/01/20  Angiulli, Lavon Paganini, PA-C    Allergies  Allergen Reactions  . Amoxicillin Itching    Did it involve swelling of the face/tongue/throat, SOB, or low BP? No Did it involve sudden or severe rash/hives, skin peeling, or any reaction on the inside of your mouth or nose? No Did you need to seek medical attention at a hospital or doctor's office? No When did it last happen?within the  past 10 years If all above answers are "NO", may proceed with cephalosporin use.   Marland Kitchen Lisinopril Swelling    Angioedema   . Hydrocodone Other (See Comments)    Depressed   . Tegretol [Carbamazepine] Swelling    Throat swells    Patient Active Problem List   Diagnosis Date Noted  . COVID-19 10/31/2020  . Morbid (severe) obesity due to excess calories ([place]) 04/18/2020  . HLD (hyperlipidemia) 10/12/2019  . CKD (chronic kidney disease) stage 3, GFR 30-59 ml/min (HCC) 10/12/2019  . Chronic combined systolic and diastolic heart failure ([place])   . Depression 07/14/2019  . Bipolar disorder ([place]) 05/13/2019  . Bipolar 1 disorder ([place]) 05/13/2019  . Adjustment disorder with anxiety   . History of recent stroke 08/06/2017  . OSA (obstructive sleep apnea)   . Controlled type 2 diabetes mellitus without complication, without long-term current use of insulin ([place]) 05/08/2017  . Hypertension associated with diabetes ([place]) 07/27/2008  . Seizure disorder ([place]) 02/05/2007    Past Medical History:  Diagnosis Date  . Anemia   . Anxiety   . Bipolar 1 disorder ([place])   . Common migraine 05/19/2015  . Depression   . Diabetes mellitus, type II ([place])   . Hypertension   . Irritable bowel syndrome (IBS)   .  Mild mental retardation   . Obesity   . Partial complex seizure disorder with intractable epilepsy ([place]) 05/12/2014  . Seizures ([place])    intractable, sz 08/23/17  . Sleep apnea   . Stroke ([place])   . Type II or unspecified type diabetes mellitus without mention of complication, not stated as uncontrolled     Past Surgical History:  Procedure Laterality Date  . BUBBLE STUDY  11/15/2019   Procedure: BUBBLE STUDY;  Surgeon: Dorothy Spark, MD;  Location: [place] [facility];  Service: Cardiovascular;;  . COLONOSCOPY     2012-normal , Dr Sharlett Iles  . ESOPHAGOGASTRODUODENOSCOPY     normal-Dr Patterson 2012  . LOOP RECORDER INSERTION N/A 05/30/2017   Procedure: Loop Recorder Insertion;  Surgeon:  Constance Haw, MD;  Location: [facility];  Service: Cardiovascular;  Laterality: N/A;  . LOOP RECORDER REMOVAL N/A 03/04/2018   Procedure: LOOP RECORDER REMOVAL;  Surgeon: Constance Haw, MD;  Location: [facility];  Service: Cardiovascular;  Laterality: N/A;  . MYRINGOTOMY WITH TUBE PLACEMENT    . MYRINGOTOMY WITH TUBE PLACEMENT Right 11/05/2017   Procedure: MYRINGOTOMY WITH TUBE PLACEMENT;  Surgeon: Melissa Montane, MD;  Location: [facility];  Service: ENT;  Laterality: Right;  right T Tube placement  . NASAL SINUS SURGERY    . TEE WITHOUT CARDIOVERSION N/A 05/30/2017   Procedure: TRANSESOPHAGEAL ECHOCARDIOGRAM (TEE);  Surgeon: Acie Fredrickson Wonda Cheng, MD;  Location: [facility];  Service: Cardiovascular;  Laterality: N/A;  . TEE WITHOUT CARDIOVERSION N/A 11/15/2019   Procedure: TRANSESOPHAGEAL ECHOCARDIOGRAM (TEE);  Surgeon: Dorothy Spark, MD;  Location: [facility];  Service: Cardiovascular;  Laterality: N/A;    Social History   Socioeconomic History  . Marital status: Single    Spouse name: [name]  . Number of children: 3  . Years of education: 35  . Highest education level: Not on file  Occupational History  . Occupation: disabled    Employer: DISABLED  Tobacco Use  . Smoking status: Never Smoker  . Smokeless tobacco: Never Used  Vaping Use  . Vaping Use: Never used  Substance and Sexual Activity  . Alcohol use: No  . Drug use: No  . Sexual activity: Never  Other Topics Concern  . Not on file  Social History Narrative   Patient lives at home with daughter.    Patient has 3 children.    Patient is right handed.    Patient has a high school education.    Patient is on disability   Patient drinks 2 cups of caffeine daily.   Social Determinants of Health   Financial Resource Strain: Not on file  Food Insecurity: No Food Insecurity  . Worried About Charity fundraiser in the Last Year: Never true  . Ran Out of Food in the Last Year: Never true   Transportation Needs: No Transportation Needs  . Lack of Transportation (Medical): No  . Lack of Transportation (Non-Medical): No  Physical Activity: Not on file  Stress: Not on file  Social Connections: Not on file  Intimate Partner Violence: Not on file    Family History  Problem Relation Age of Onset  . Diabetes Mother        passed away from accidental death  . Hypertension Mother   . Bipolar disorder Father   . Diabetes Daughter   . Leukemia Daughter   . Ovarian cancer Maternal Aunt      Review of Systems  Constitutional: Negative.  Negative for chills and  fever.  HENT: Negative.  Negative for congestion and sore throat.   Respiratory: Negative.  Negative for cough and shortness of breath.   Cardiovascular: Negative.  Negative for chest pain and palpitations.  Gastrointestinal: Negative for abdominal pain, diarrhea, nausea and vomiting.  Genitourinary: Negative.  Negative for dysuria and hematuria.  Musculoskeletal: Negative.  Negative for back pain, myalgias and neck pain.  Skin: Negative.  Negative for rash.  Neurological: Negative.  Negative for dizziness and headaches.  All other systems reviewed and are negative.   Today's Vitals   11/20/20 1117  BP: 133/76  Pulse: 99  Resp: 16  Temp: (!) 97.4 F (36.3 C)  TempSrc: Temporal  SpO2: 96%  Weight: (!) 305 lb (138.3 kg)  Height: 5\' 1"  (1.549 m)   Body mass index is 57.63 kg/m. Wt Readings from Last 3 Encounters:  11/20/20 (!) 305 lb (138.3 kg)  10/31/20 250 lb (113.4 kg)  08/29/20 (!) 316 lb (143.3 kg)    Physical Exam Vitals reviewed.  Constitutional:      Appearance: Normal appearance. She is obese.  HENT:     Head: Normocephalic.  Eyes:     Extraocular Movements: Extraocular movements intact.     Pupils: Pupils are equal, round, and reactive to light.  Cardiovascular:     Rate and Rhythm: Normal rate and regular rhythm.     Pulses: Normal pulses.     Heart sounds: Normal heart sounds.   Pulmonary:     Effort: Pulmonary effort is normal.     Breath sounds: Normal breath sounds.  Abdominal:     General: There is no distension.     Palpations: Abdomen is soft.     Tenderness: There is no abdominal tenderness.  Musculoskeletal:        General: Normal range of motion.     Cervical back: Normal range of motion.  Skin:    General: Skin is warm and dry.     Capillary Refill: Capillary refill takes less than 2 seconds.  Neurological:     General: No focal deficit present.     Mental Status: She is alert and oriented to person, place, and time.  Psychiatric:        Mood and Affect: Mood normal.        Behavior: Behavior normal.     Results for orders placed or performed in visit on 11/20/20 (from the past 24 hour(s))  POCT glycosylated hemoglobin (Hb A1C)     Status: Abnormal   Collection Time: 11/20/20 12:08 PM  Result Value Ref Range   Hemoglobin A1C 8.9 (A) 4.0 - 5.6 %   HbA1c POC (<> result, manual entry)     HbA1c, POC (prediabetic range)     HbA1c, POC (controlled diabetic range)      ASSESSMENT & PLAN: Hypertension associated with diabetes ([place]) Well-controlled hypertension.  Continue present medication. Uncontrolled diabetes with hemoglobin A1c at 8.9.  We will continue glipizide and Januvia for now but will start weekly Trulicity and discontinue Januvia at a later time. Demonstration pen used.  Patient demonstrated good understanding and use of pen. Diet and nutrition discussed.  Patient was referred to nutritional services. Follow-up in 3 months.  A total of 45 minutes was spent with the patient, greater than 50% of which was in counseling/coordination of care regarding diabetes and cardiovascular risks associated with this condition, review of all medications and changes, demonstration on how to use Trulicity pen, education on nutrition, review of most recent office  visit notes, review of most recent hospital discharge summary, review of most recent blood  work results including today's hemoglobin A1c, prognosis, documentation, and need for follow-up. Treanna was seen today for hospitalization follow-up.  Diagnoses and all orders for this visit:  Hospital discharge follow-up  Hypertension associated with diabetes ([place]) -     POCT glycosylated hemoglobin (Hb A1C) -     Dulaglutide (TRULICITY) 9.32 IZ/1.2WP SOPN; Inject 0.75 mg into the skin once a week.  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  If you have not heard from [place] then please contact us. The fastest way to get your results is to register for My Chart.   IF you received an x-ray today, you will receive an invoice from [facility]. Please contact [facility] at [phone] with questions or concerns regarding your invoice.   IF you received labwork today, you will receive an invoice from [facility]. Please contact [facility] at [phone] with questions or concerns regarding your invoice.   Our billing staff will not be able to assist you with questions regarding bills from these companies.  You will be contacted with the lab results as soon as they are available. The fastest way to get your results is to activate your My Chart account. Instructions are located on the last page of this paperwork. If you have not heard from [place] regarding the results in 2 weeks, please contact this office.     Diabetes Mellitus and Nutrition, Adult When you have diabetes (diabetes mellitus), it is very important  to have healthy eating habits because your blood sugar (glucose) levels are greatly affected by what you eat and drink. Eating healthy foods in the appropriate amounts, at about the same times every day, can help you:  Control your blood glucose.  Lower your risk of heart disease.  Improve your blood pressure.  Reach or maintain a healthy weight. Every person with diabetes is different, and each person has different needs for a meal plan. Your health care provider may recommend that you work with a diet and nutrition specialist (dietitian) to make a meal plan that is best for you. Your meal plan may vary depending on factors such as:  The calories you need.  The medicines you take.  Your weight.  Your blood glucose, blood pressure, and cholesterol levels.  Your activity level.  Other health conditions you have, such as heart or kidney disease. How do carbohydrates affect me? Carbohydrates, also called carbs, affect your blood glucose level more than any other type of food. Eating carbs naturally raises the amount of glucose in your blood. Carb counting is a method for keeping track of how many carbs you eat. Counting carbs is important to keep your blood glucose at a healthy level, especially if you use insulin or take certain oral diabetes medicines. It is important to know how many carbs you can safely have in each meal. This is different for every person. Your dietitian can help you calculate how many carbs you should have at each meal  and for each snack. Foods that contain carbs include:  Bread, cereal, rice, pasta, and crackers.  Potatoes and corn.  Peas, beans, and lentils.  Milk and yogurt.  Fruit and juice.  Desserts, such as cakes, cookies, ice cream, and candy. How does alcohol affect me? Alcohol can cause a sudden decrease in blood glucose (hypoglycemia), especially if you use insulin or take certain oral diabetes medicines. Hypoglycemia can be a life-threatening  condition. Symptoms of hypoglycemia (sleepiness, dizziness, and confusion) are similar to symptoms of having too much alcohol. If your health care provider says that alcohol is safe for you, follow these guidelines:  Limit alcohol intake to no more than 1 drink per day for nonpregnant women and 2 drinks per day for men. One drink equals 12 oz of beer, 5 oz of wine, or 1 oz of hard liquor.  Do not drink on an empty stomach.  Keep yourself hydrated with water, diet soda, or unsweetened iced tea.  Keep in mind that regular soda, juice, and other mixers may contain a lot of sugar and must be counted as carbs. What are tips for following this plan?  Reading food labels  Start by checking the serving size on the "Nutrition Facts" label of packaged foods and drinks. The amount of calories, carbs, fats, and other nutrients listed on the label is based on one serving of the item. Many items contain more than one serving per package.  Check the total grams (g) of carbs in one serving. You can calculate the number of servings of carbs in one serving by dividing the total carbs by 15. For example, if a food has 30 g of total carbs, it would be equal to 2 servings of carbs.  Check the number of grams (g) of saturated and trans fats in one serving. Choose foods that have low or no amount of these fats.  Check the number of milligrams (mg) of salt (sodium) in one serving. Most people should limit total sodium intake to less than 2,300 mg per day.  Always check the nutrition information of foods labeled as "low-fat" or "nonfat". These foods may be higher in added sugar or refined carbs and should be avoided.  Talk to your dietitian to identify your daily goals for nutrients listed on the label. Shopping  Avoid buying canned, premade, or processed foods. These foods tend to be high in fat, sodium, and added sugar.  Shop around the outside edge of the grocery store. This includes fresh fruits and  vegetables, bulk grains, fresh meats, and fresh dairy. Cooking  Use low-heat cooking methods, such as baking, instead of high-heat cooking methods like deep frying.  Cook using healthy oils, such as olive, canola, or sunflower oil.  Avoid cooking with butter, cream, or high-fat meats. Meal planning  Eat meals and snacks regularly, preferably at the same times every day. Avoid going long periods of time without eating.  Eat foods high in fiber, such as fresh fruits, vegetables, beans, and whole grains. Talk to your dietitian about how many servings of carbs you can eat at each meal.  Eat 4-6 ounces (oz) of lean protein each day, such as lean meat, chicken, fish, eggs, or tofu. One oz of lean protein is equal to: ? 1 oz of meat, chicken, or fish. ? 1 egg. ?  cup of tofu.  Eat some foods each day that contain healthy fats, such as avocado, nuts, seeds, and fish. Lifestyle  Check your blood glucose regularly.  Exercise  regularly as told by your health care provider. This may include: ? 150 minutes of moderate-intensity or vigorous-intensity exercise each week. This could be brisk walking, biking, or water aerobics. ? Stretching and doing strength exercises, such as yoga or weightlifting, at least 2 times a week.  Take medicines as told by your health care provider.  Do not use any products that contain nicotine or tobacco, such as cigarettes and e-cigarettes. If you need help quitting, ask your health care provider.  Work with a Social worker or diabetes educator to identify strategies to manage stress and any emotional and social challenges. Questions to ask a health care provider  Do I need to meet with a diabetes educator?  Do I need to meet with a dietitian?  What number can I call if I have questions?  When are the best times to check my blood glucose? Where to find more information:  American Diabetes Association: diabetes.org  Academy of Nutrition and Dietetics:  www.eatright.[employer] of Diabetes and Digestive and Kidney Diseases (NIH): [employer] Summary  A healthy meal plan will help you control your blood glucose and maintain a healthy lifestyle.  Working with a diet and nutrition specialist (dietitian) can help you make a meal plan that is best for you.  Keep in mind that carbohydrates (carbs) and alcohol have immediate effects on your blood glucose levels. It is important to count carbs and to use alcohol carefully. This information is not intended to replace advice given to you by your health care provider. Make sure you discuss any questions you have with your health care provider. Document Revised: 11/07/2017 Document Reviewed: 12/30/2016 Elsevier Patient Education  2020 Elsevier Inc.      Agustina Caroli, MD [facility]

## 2020-11-20 NOTE — Assessment & Plan Note (Signed)
Well-controlled hypertension.  Continue present medication. Uncontrolled diabetes with hemoglobin A1c at 8.9.  We will continue glipizide and Januvia for now but will start weekly Trulicity and discontinue Januvia at a later time. Demonstration pen used.  Patient demonstrated good understanding and use of pen. Diet and nutrition discussed.  Patient was referred to nutritional services. Follow-up in 3 months.

## 2020-11-20 NOTE — Patient Instructions (Addendum)
   If you have lab work done today you will be contacted with your lab results within the next 2 weeks.  If you have not heard from us then please contact us. The fastest way to get your results is to register for My Chart.   IF you received an x-ray today, you will receive an invoice from [facility]. Please contact  Radiology at [phone] with questions or concerns regarding your invoice.   IF you received labwork today, you will receive an invoice from [facility]. Please contact [facility] at [phone] with questions or concerns regarding your invoice.   Our billing staff will not be able to assist you with questions regarding bills from these companies.  You will be contacted with the lab results as soon as they are available. The fastest way to get your results is to activate your My Chart account. Instructions are located on the last page of this paperwork. If you have not heard from us regarding the results in 2 weeks, please contact this office.     Diabetes Mellitus and Nutrition, Adult When you have diabetes (diabetes mellitus), it is very important to have healthy eating habits because your blood sugar (glucose) levels are greatly affected by what you eat and drink. Eating healthy foods in the appropriate amounts, at about the same times every day, can help you:  Control your blood glucose.  Lower your risk of heart disease.  Improve your blood pressure.  Reach or maintain a healthy weight. Every person with diabetes is different, and each person has different needs for a meal plan. Your health care provider may recommend that you work with a diet and nutrition specialist (dietitian) to make a meal plan that is best for you. Your meal plan may vary depending on factors such as:  The calories you need.  The medicines you take.  Your weight.  Your blood glucose, blood pressure, and cholesterol levels.  Your activity level.  Other health conditions  you have, such as heart or kidney disease. How do carbohydrates affect me? Carbohydrates, also called carbs, affect your blood glucose level more than any other type of food. Eating carbs naturally raises the amount of glucose in your blood. Carb counting is a method for keeping track of how many carbs you eat. Counting carbs is important to keep your blood glucose at a healthy level, especially if you use insulin or take certain oral diabetes medicines. It is important to know how many carbs you can safely have in each meal. This is different for every person. Your dietitian can help you calculate how many carbs you should have at each meal and for each snack. Foods that contain carbs include:  Bread, cereal, rice, pasta, and crackers.  Potatoes and corn.  Peas, beans, and lentils.  Milk and yogurt.  Fruit and juice.  Desserts, such as cakes, cookies, ice cream, and candy. How does alcohol affect me? Alcohol can cause a sudden decrease in blood glucose (hypoglycemia), especially if you use insulin or take certain oral diabetes medicines. Hypoglycemia can be a life-threatening condition. Symptoms of hypoglycemia (sleepiness, dizziness, and confusion) are similar to symptoms of having too much alcohol. If your health care provider says that alcohol is safe for you, follow these guidelines:  Limit alcohol intake to no more than 1 drink per day for nonpregnant women and 2 drinks per day for men. One drink equals 12 oz of beer, 5 oz of wine, or 1 oz of hard liquor.    Do not drink on an empty stomach.  Keep yourself hydrated with water, diet soda, or unsweetened iced tea.  Keep in mind that regular soda, juice, and other mixers may contain a lot of sugar and must be counted as carbs. What are tips for following this plan?  Reading food labels  Start by checking the serving size on the "Nutrition Facts" label of packaged foods and drinks. The amount of calories, carbs, fats, and other  nutrients listed on the label is based on one serving of the item. Many items contain more than one serving per package.  Check the total grams (g) of carbs in one serving. You can calculate the number of servings of carbs in one serving by dividing the total carbs by 15. For example, if a food has 30 g of total carbs, it would be equal to 2 servings of carbs.  Check the number of grams (g) of saturated and trans fats in one serving. Choose foods that have low or no amount of these fats.  Check the number of milligrams (mg) of salt (sodium) in one serving. Most people should limit total sodium intake to less than 2,300 mg per day.  Always check the nutrition information of foods labeled as "low-fat" or "nonfat". These foods may be higher in added sugar or refined carbs and should be avoided.  Talk to your dietitian to identify your daily goals for nutrients listed on the label. Shopping  Avoid buying canned, premade, or processed foods. These foods tend to be high in fat, sodium, and added sugar.  Shop around the outside edge of the grocery store. This includes fresh fruits and vegetables, bulk grains, fresh meats, and fresh dairy. Cooking  Use low-heat cooking methods, such as baking, instead of high-heat cooking methods like deep frying.  Cook using healthy oils, such as olive, canola, or sunflower oil.  Avoid cooking with butter, cream, or high-fat meats. Meal planning  Eat meals and snacks regularly, preferably at the same times every day. Avoid going long periods of time without eating.  Eat foods high in fiber, such as fresh fruits, vegetables, beans, and whole grains. Talk to your dietitian about how many servings of carbs you can eat at each meal.  Eat 4-6 ounces (oz) of lean protein each day, such as lean meat, chicken, fish, eggs, or tofu. One oz of lean protein is equal to: ? 1 oz of meat, chicken, or fish. ? 1 egg. ?  cup of tofu.  Eat some foods each day that contain  healthy fats, such as avocado, nuts, seeds, and fish. Lifestyle  Check your blood glucose regularly.  Exercise regularly as told by your health care provider. This may include: ? 150 minutes of moderate-intensity or vigorous-intensity exercise each week. This could be brisk walking, biking, or water aerobics. ? Stretching and doing strength exercises, such as yoga or weightlifting, at least 2 times a week.  Take medicines as told by your health care provider.  Do not use any products that contain nicotine or tobacco, such as cigarettes and e-cigarettes. If you need help quitting, ask your health care provider.  Work with a counselor or diabetes educator to identify strategies to manage stress and any emotional and social challenges. Questions to ask a health care provider  Do I need to meet with a diabetes educator?  Do I need to meet with a dietitian?  What number can I call if I have questions?  When are the best times to   times to check my blood glucose? Where to find more information:  American Diabetes Association: diabetes.org  Academy of Nutrition and Dietetics: www.eatright.org  National Institute of Diabetes and Digestive and Kidney Diseases (NIH): www.niddk.nih.gov Summary  A healthy meal plan will help you control your blood glucose and maintain a healthy lifestyle.  Working with a diet and nutrition specialist (dietitian) can help you make a meal plan that is best for you.  Keep in mind that carbohydrates (carbs) and alcohol have immediate effects on your blood glucose levels. It is important to count carbs and to use alcohol carefully. This information is not intended to replace advice given to you by your health care provider. Make sure you discuss any questions you have with your health care provider. Document Revised: 11/07/2017 Document Reviewed: 12/30/2016 Elsevier Patient Education  2020 Elsevier Inc.  

## 2020-11-20 NOTE — Telephone Encounter (Signed)
Spoke to patient gave her the number to [facility] [phone]) to make an appointment. Patient was contacted on 08/30/2020 to schedule appointment, per Jeralene Huff, patient stated she will call back to schedule.

## 2020-11-24 DIAGNOSIS — G4733 Obstructive sleep apnea (adult) (pediatric): Secondary | ICD-10-CM | POA: Diagnosis not present

## 2020-11-28 ENCOUNTER — Ambulatory Visit: Payer: Medicare HMO | Admitting: Emergency Medicine

## 2020-11-29 DIAGNOSIS — G4733 Obstructive sleep apnea (adult) (pediatric): Secondary | ICD-10-CM | POA: Diagnosis not present

## 2020-12-05 ENCOUNTER — Other Ambulatory Visit: Payer: Self-pay

## 2020-12-05 NOTE — Patient Outreach (Signed)
[facility]) Care Management  12/05/2020  [name] March 18, 1971 [id number]   Telephone call to patient for disease management follow up.   No answer.  HIPAA compliant voice message left.    Plan: If no return call, RN CM will attempt patient again with the next 4 business days.    Bary Leriche, RN, MSN [facility] Care Management Care Management Coordinator Direct Line [phone] Cell [phone] Toll Free: [phone]  Fax: [phone]

## 2020-12-06 ENCOUNTER — Other Ambulatory Visit: Payer: Self-pay

## 2020-12-06 NOTE — Patient Instructions (Signed)
Goals Addressed            This Visit's Progress    THN-Make and Keep All Appointments   On track    Timeframe:  Long-Range Goal Priority:  High Start Date:  12/06/20                          Expected End Date:      01/08/21               Follow Up Date 01/08/21   - arrange a ride through an agency 1 week before appointment - call to cancel if needed - keep a calendar with appointment dates    Why is this important?    Part of staying healthy is seeing the doctor for follow-up care.   If you forget your appointments, there are some things you can do to stay on track.    Notes:      THN-Protect My Health   On track    Timeframe:  Long-Range Goal Priority:  High Start Date:    12/07/20                         Expected End Date:  01/08/21                     Follow Up Date 01/08/21   - schedule appointment for vaccines needed due to my age or health - schedule recommended health tests (blood work, mammogram, colonoscopy, pap test) - schedule and keep appointment for annual check-up    Why is this important?    Screening tests can find diseases early when they are easier to treat.   Your doctor or nurse will talk with you about which tests are important for you.   Getting shots for common diseases like the flu and shingles will help prevent them.     Notes:      Track and Manage My Blood Pressure       Timeframe:  Long-Range Goal Priority:  High Start Date:   12/07/20                          Expected End Date:   01/08/21                    Follow Up Date  01/08/21   - check blood pressure weekly - write blood pressure results in a log or diary    Why is this important?    You won't feel high blood pressure, but it can still hurt your blood vessels.   High blood pressure can cause heart or kidney problems. It can also cause a stroke.   Making lifestyle changes like losing a little weight or eating less salt will help.   Checking your blood pressure at  home and at different times of the day can help to control blood pressure.   If the doctor prescribes medicine remember to take it the way the doctor ordered.   Call the office if you cannot afford the medicine or if there are questions about it.     Notes:

## 2020-12-06 NOTE — Patient Outreach (Signed)
[facility]) Care Management  [place]  12/06/2020   Brandi Gomez 11/20/1971 [id number]  Subjective: Incoming call from patient. She reports she is doing good today. She reports she was feeling down a few days but attributed it to recent COVID.  Discussed COVID and recovery and how she gets better daily. Patient reports she is not using her oxygen all the time any longer and she feels better.  She reports that her blood pressure is good.  Last check at [facility] was 133/76.  Encouraged patient to continue maintaining her health. She verbalized understanding.    Objective:   Encounter Medications:  Outpatient Encounter Medications as of 12/06/2020  Medication Sig Note  . acetaminophen (TYLENOL) 325 MG tablet Take 2 tablets (650 mg total) by mouth every 4 (four) hours as needed for mild pain (or temp > 37.5 C (99.5 F)). 10/31/2020: .  Marland Kitchen ARIPiprazole (ABILIFY) 5 MG tablet 1 qam   . atorvastatin (LIPITOR) 40 MG tablet TAKE 1 TABLET (40 MG TOTAL) BY MOUTH DAILY AT 6 PM FOR CHOLESTEROL   . carvedilol (COREG) 3.125 MG tablet Take 3.125 mg by mouth 2 (two) times daily.   . Dulaglutide (TRULICITY) A999333 0000000 SOPN Inject 0.75 mg into the skin once a week.   Marland Kitchen glipiZIDE (GLUCOTROL) 5 MG tablet Take 1 tablet (5 mg total) by mouth 2 (two) times daily with a meal.   . guaiFENesin-dextromethorphan (ROBITUSSIN DM) 100-10 MG/5ML syrup Take 10 mLs by mouth every 4 (four) hours as needed for cough. (Patient not taking: Reported on 11/20/2020)   . hydrALAZINE (APRESOLINE) 10 MG tablet Take 1 tablet (10 mg total) by mouth 2 (two) times daily. 10/31/2020: .  . ipratropium (ATROVENT) 0.06 % nasal spray Place 2 sprays into both nostrils 3 (three) times daily as needed for rhinitis. (Patient not taking: Reported on 11/20/2020)   . isosorbide dinitrate (ISORDIL) 10 MG tablet Take 1 tablet (10 mg total) by mouth 2 (two) times daily. 11/20/2020: taking  . lamoTRIgine (LAMICTAL) 100 MG  tablet Take 100 mg by mouth 2 (two) times daily.   Marland Kitchen torsemide (DEMADEX) 10 MG tablet Take 10 mg by mouth daily.   . [DISCONTINUED] methylphenidate (RITALIN) 5 MG tablet Take 1 tablet (5 mg total) by mouth 2 (two) times daily. (Patient not taking: Reported on 12/29/2019) 11/08/2019: Hasnt started  . [DISCONTINUED] traZODone (DESYREL) 100 MG tablet Take 1 tablet (100 mg total) by mouth at bedtime. (Patient not taking: Reported on 12/29/2019)    No facility-administered encounter medications on file as of 12/06/2020.    Functional Status:  In your present state of health, do you have any difficulty performing the following activities: 11/06/2020 10/31/2020  Hearing? N -  Vision? N -  Difficulty concentrating or making decisions? Y -  Comment some short term memory loss at times -  Walking or climbing stairs? Y Y  Comment some shortness of breath -  Dressing or bathing? N -  Doing errands, shopping? [place] and eating ? N -  Using the Toilet? N -  In the past six months, have you accidently leaked urine? N -  Do you have problems with loss of bowel control? - -  Managing your Medications? Y -  Comment Daughter helps with medications -  Managing your Finances? N -  Housekeeping or managing your Housekeeping? N -  Some encounter information is confidential and restricted. Go to Review Flowsheets activity to see  all data.  Some recent data might be hidden    Fall/Depression Screening: Fall Risk  11/20/2020 11/06/2020 08/29/2020  Falls in the past year? 0 0 1  Number falls in past yr: - - 0  Injury with Fall? - - 0  Comment - - -  Follow up Falls evaluation completed - Falls evaluation completed   PHQ 2/9 Scores 11/20/2020 11/06/2020 08/29/2020 04/18/2020 04/14/2020 01/04/2020 09/01/2019  PHQ - 2 Score 0 0 0 1 1 0 0    Assessment:  Patient continues post COVID recovery.   Goals Addressed            This Visit's Progress   . THN-Make and Keep All Appointments    On track    Timeframe:  Long-Range Goal Priority:  High Start Date:  12/06/20                          Expected End Date:      01/08/21               Follow Up Date 01/08/21   - arrange a ride through an agency 1 week before appointment - call to cancel if needed - keep a calendar with appointment dates    Why is this important?    Part of staying healthy is seeing the doctor for follow-up care.   If you forget your appointments, there are some things you can do to stay on track.    Notes:     . THN-Protect My Health   On track    Timeframe:  Long-Range Goal Priority:  High Start Date:    12/07/20                         Expected End Date:  01/08/21                     Follow Up Date 01/08/21   - schedule appointment for vaccines needed due to my age or health - schedule recommended health tests (blood work, mammogram, colonoscopy, pap test) - schedule and keep appointment for annual check-up    Why is this important?    Screening tests can find diseases early when they are easier to treat.   Your doctor or nurse will talk with you about which tests are important for you.   Getting shots for common diseases like the flu and shingles will help prevent them.     Notes:     . Track and Manage My Blood Pressure       Timeframe:  Long-Range Goal Priority:  High Start Date:   12/07/20                          Expected End Date:   01/08/21                    Follow Up Date  01/08/21   - check blood pressure weekly - write blood pressure results in a log or diary    Why is this important?    You won't feel high blood pressure, but it can still hurt your blood vessels.   High blood pressure can cause heart or kidney problems. It can also cause a stroke.   Making lifestyle changes like losing a little weight or eating less salt will help.   Checking your blood pressure at home  and at different times of the day can help to control blood pressure.   If the doctor  prescribes medicine remember to take it the way the doctor ordered.   Call the office if you cannot afford the medicine or if there are questions about it.     Notes:        Plan: RN CM will contact patient next month and patient agreeable.   Follow-up:  Patient agrees to Care Plan and Follow-up.   Bary Leriche, RN, MSN [facility] Care Management Care Management Coordinator Direct Line [phone] Cell [phone] Toll Free: [phone]  Fax: [phone]

## 2020-12-09 DIAGNOSIS — U071 COVID-19: Secondary | ICD-10-CM | POA: Diagnosis not present

## 2020-12-09 DIAGNOSIS — R569 Unspecified convulsions: Secondary | ICD-10-CM | POA: Diagnosis not present

## 2020-12-09 DIAGNOSIS — G4733 Obstructive sleep apnea (adult) (pediatric): Secondary | ICD-10-CM | POA: Diagnosis not present

## 2020-12-09 DIAGNOSIS — M545 Low back pain, unspecified: Secondary | ICD-10-CM | POA: Diagnosis not present

## 2020-12-12 ENCOUNTER — Ambulatory Visit: Payer: Self-pay

## 2020-12-12 ENCOUNTER — Ambulatory Visit: Payer: Medicare HMO | Admitting: Dietician

## 2020-12-13 ENCOUNTER — Telehealth: Payer: Self-pay | Admitting: *Deleted

## 2020-12-13 NOTE — Telephone Encounter (Signed)
Schedule AWV.  

## 2020-12-14 ENCOUNTER — Telehealth: Payer: Self-pay | Admitting: Emergency Medicine

## 2020-12-14 NOTE — Telephone Encounter (Signed)
patient returning Julies call to  Schedule AWV

## 2020-12-19 ENCOUNTER — Telehealth (HOSPITAL_COMMUNITY): Payer: Medicare HMO | Admitting: Psychiatry

## 2020-12-26 ENCOUNTER — Other Ambulatory Visit: Payer: Self-pay

## 2020-12-26 NOTE — Patient Instructions (Signed)
Goals Addressed            This Visit's Progress   . THN-Make and Keep All Appointments   On track    Timeframe:  Long-Range Goal Priority:  High Start Date:  12/06/20                          Expected End Date:     04/07/21              Follow Up Date 04/07/21   - arrange a ride through an agency 1 week before appointment - call to cancel if needed - keep a calendar with appointment dates    Why is this important?    Part of staying healthy is seeing the doctor for follow-up care.   If you forget your appointments, there are some things you can do to stay on track.    Notes:     . THN-Protect My Health   On track    Timeframe:  Long-Range Goal Priority:  High Start Date:    12/07/20                         Expected End Date:  04/07/21                     Follow Up Date 04/07/21   - schedule appointment for vaccines needed due to my age or health - schedule recommended health tests (blood work, mammogram, colonoscopy, pap test) - schedule and keep appointment for annual check-up    Why is this important?    Screening tests can find diseases early when they are easier to treat.   Your doctor or nurse will talk with you about which tests are important for you.   Getting shots for common diseases like the flu and shingles will help prevent them.     Notes:     . Track and Manage My Blood Pressure   On track    Timeframe:  Long-Range Goal Priority:  High Start Date:   12/07/20                          Expected End Date:   04/07/21  Follow Up Date  04/07/21   - check blood pressure weekly - write blood pressure results in a log or diary    Why is this important?    You won't feel high blood pressure, but it can still hurt your blood vessels.   High blood pressure can cause heart or kidney problems. It can also cause a stroke.   Making lifestyle changes like losing a little weight or eating less salt will help.   Checking your blood pressure at home and at  different times of the day can help to control blood pressure.   If the doctor prescribes medicine remember to take it the way the doctor ordered.   Call the office if you cannot afford the medicine or if there are questions about it.     Notes:

## 2020-12-26 NOTE — Patient Outreach (Signed)
[facility]) Care Management  [place]  12/26/2020   Brandi Gomez May 17, 1971 [id number]  Subjective: Telephone call to patient for follow up. Patient reports she is doing good. She denies any further problems from COVID infection. Patient reports that her blood pressure is good but did not give numbers. Encouraged patient to at least check her blood pressure weekly and write it down and take to her physician appointments.  She verbalized understanding.    Objective:   Encounter Medications:  Outpatient Encounter Medications as of 12/26/2020  Medication Sig Note  . acetaminophen (TYLENOL) 325 MG tablet Take 2 tablets (650 mg total) by mouth every 4 (four) hours as needed for mild pain (or temp > 37.5 C (99.5 F)). 10/31/2020: .  Marland Kitchen ARIPiprazole (ABILIFY) 5 MG tablet 1 qam   . atorvastatin (LIPITOR) 40 MG tablet TAKE 1 TABLET (40 MG TOTAL) BY MOUTH DAILY AT 6 PM FOR CHOLESTEROL   . carvedilol (COREG) 3.125 MG tablet Take 3.125 mg by mouth 2 (two) times daily.   . Dulaglutide (TRULICITY) 8.54 OE/7.0JJ SOPN Inject 0.75 mg into the skin once a week.   . lamoTRIgine (LAMICTAL) 100 MG tablet Take 100 mg by mouth 2 (two) times daily.   Marland Kitchen torsemide (DEMADEX) 10 MG tablet Take 10 mg by mouth daily.   Marland Kitchen glipiZIDE (GLUCOTROL) 5 MG tablet Take 1 tablet (5 mg total) by mouth 2 (two) times daily with a meal.   . guaiFENesin-dextromethorphan (ROBITUSSIN DM) 100-10 MG/5ML syrup Take 10 mLs by mouth every 4 (four) hours as needed for cough. (Patient not taking: Reported on 12/26/2020)   . hydrALAZINE (APRESOLINE) 10 MG tablet Take 1 tablet (10 mg total) by mouth 2 (two) times daily. 10/31/2020: .  . ipratropium (ATROVENT) 0.06 % nasal spray Place 2 sprays into both nostrils 3 (three) times daily as needed for rhinitis. (Patient not taking: No sig reported)   . isosorbide dinitrate (ISORDIL) 10 MG tablet Take 1 tablet (10 mg total) by mouth 2 (two) times daily. 11/20/2020: taking   . [DISCONTINUED] methylphenidate (RITALIN) 5 MG tablet Take 1 tablet (5 mg total) by mouth 2 (two) times daily. (Patient not taking: Reported on 12/29/2019) 11/08/2019: Hasnt started  . [DISCONTINUED] traZODone (DESYREL) 100 MG tablet Take 1 tablet (100 mg total) by mouth at bedtime. (Patient not taking: Reported on 12/29/2019)    No facility-administered encounter medications on file as of 12/26/2020.    Functional Status:  In your present state of health, do you have any difficulty performing the following activities: 12/26/2020 11/06/2020  Hearing? N N  Vision? N N  Difficulty concentrating or making decisions? Y Y  Comment some short term memory loss at times some short term memory loss at times  Walking or climbing stairs? Y Y  Comment some shortness of breath some shortness of breath  Dressing or bathing? N N  Doing errands, shopping? N [place] and eating ? N N  Using the Toilet? N N  In the past six months, have you accidently leaked urine? N N  Do you have problems with loss of bowel control? N -  Managing your Medications? Tempie Donning  Comment daughter helps with medications Daughter helps with medications  Managing your Finances? N N  Housekeeping or managing your Housekeeping? N N  Some recent data might be hidden    Fall/Depression Screening: Fall Risk  12/26/2020 11/20/2020 11/06/2020  Falls in the past year? 0 0  0  Number falls in past yr: - - -  Injury with Fall? - - -  Comment - - -  Follow up - Falls evaluation completed -   PHQ 2/9 Scores 12/26/2020 12/06/2020 11/20/2020 11/06/2020 08/29/2020 04/18/2020 04/14/2020  PHQ - 2 Score 1 1 0 0 0 1 1    Assessment: Patient managing well since COVID infection. Patient managing chronic conditions.   Goals Addressed            This Visit's Progress   . THN-Make and Keep All Appointments   On track    Timeframe:  Long-Range Goal Priority:  High Start Date:  12/06/20                          Expected End  Date:     04/07/21              Follow Up Date 04/07/21   - arrange a ride through an agency 1 week before appointment - call to cancel if needed - keep a calendar with appointment dates    Why is this important?    Part of staying healthy is seeing the doctor for follow-up care.   If you forget your appointments, there are some things you can do to stay on track.    Notes:     . THN-Protect My Health   On track    Timeframe:  Long-Range Goal Priority:  High Start Date:    12/07/20                         Expected End Date:  04/07/21                     Follow Up Date 04/07/21   - schedule appointment for vaccines needed due to my age or health - schedule recommended health tests (blood work, mammogram, colonoscopy, pap test) - schedule and keep appointment for annual check-up    Why is this important?    Screening tests can find diseases early when they are easier to treat.   Your doctor or nurse will talk with you about which tests are important for you.   Getting shots for common diseases like the flu and shingles will help prevent them.     Notes:     . Track and Manage My Blood Pressure   On track    Timeframe:  Long-Range Goal Priority:  High Start Date:   12/07/20                          Expected End Date:   04/07/21  Follow Up Date  04/07/21   - check blood pressure weekly - write blood pressure results in a log or diary    Why is this important?    You won't feel high blood pressure, but it can still hurt your blood vessels.   High blood pressure can cause heart or kidney problems. It can also cause a stroke.   Making lifestyle changes like losing a little weight or eating less salt will help.   Checking your blood pressure at home and at different times of the day can help to control blood pressure.   If the doctor prescribes medicine remember to take it the way the doctor ordered.   Call the office if you cannot afford the medicine or if there are  questions about it.     Notes:        Plan: RN CM will follow up in the month of March. Follow-up:  Patient agrees to Care Plan and Follow-up.   [name], RN, MSN [place] Management Care Management Coordinator Direct Line [phone] Cell [phone] Toll Free: [phone]  Fax: [phone]

## 2020-12-27 ENCOUNTER — Ambulatory Visit: Payer: Self-pay

## 2020-12-29 ENCOUNTER — Ambulatory Visit: Payer: Medicare HMO | Admitting: Dietician

## 2021-01-03 ENCOUNTER — Telehealth: Payer: Self-pay | Admitting: Emergency Medicine

## 2021-01-03 NOTE — Telephone Encounter (Signed)
Patient just noticed 2 BP meds   Patient is taking  hydrALAZINE (APRESOLINE) 10 MG tablet [[id number]  and  isosorbide dinitrate (ISORDIL) 10 MG tablet     Patient states she suffers from memory loss   Patients wants to know if she should still be taking these  Together    Please advise

## 2021-01-03 NOTE — Telephone Encounter (Signed)
I have called pt back and informed her that according to the last note she was taking both of the medications. Pt stated understanding.

## 2021-01-04 ENCOUNTER — Encounter: Payer: Self-pay | Admitting: Neurology

## 2021-01-04 ENCOUNTER — Ambulatory Visit (INDEPENDENT_AMBULATORY_CARE_PROVIDER_SITE_OTHER): Payer: Medicare HMO | Admitting: Neurology

## 2021-01-04 VITALS — BP 134/74 | HR 83 | Ht 61.0 in | Wt 310.0 lb

## 2021-01-04 DIAGNOSIS — G40909 Epilepsy, unspecified, not intractable, without status epilepticus: Secondary | ICD-10-CM

## 2021-01-04 DIAGNOSIS — G4733 Obstructive sleep apnea (adult) (pediatric): Secondary | ICD-10-CM | POA: Diagnosis not present

## 2021-01-04 MED ORDER — LAMOTRIGINE 200 MG PO TABS: 200.0000 mg | ORAL_TABLET | Freq: Two times a day (BID) | ORAL | 5 refills | Status: DC

## 2021-01-04 NOTE — Progress Notes (Signed)
I have read the note, and I agree with the clinical assessment and plan.  Seydina Holliman K Constance Hackenberg   

## 2021-01-04 NOTE — Progress Notes (Signed)
PATIENT: Brandi Gomez DOB: 03/01/1971  REASON FOR VISIT: follow up HISTORY FROM: patient  HISTORY OF PRESENT ILLNESS: Today 01/04/21  Dave Mannes is a 50 year old female with history of intractable seizures.  She has previously been on Keppra, Vimpat, and carbamazepine.  She is now on Lamictal.  History of right posterior frontal lobe cortical infarction.  She sees psychiatry.  Reports 2 nights ago, she had a seizure around 4 AM, her daughter comes to check in on her around that time, she was wearing CPAP, I called her friend, [name], he confirms that she was having a seizure, they could not arouse her, for about 3 minutes, her mouth was twitching.  They called the ambulance.  When the ambulance arrived, she was confused.  She has no recollection of this, reports the next morning she felt fine.  She was taking Lamictal 150 mg twice a day.  Denies any missed doses.  No other seizures of recent.  She never went to [facility] for epilepsy surgery consultation, is not interested.  Was here today for follow-up unaccompanied, but I called her friend [name] to get the history.  Review of recent CPAP download, she recently got a new machine and new supplies about 1 month ago (was actually 05/29/20).  Download indicates perfect compliance, use greater than 4 hours 30/30 days at 100%.  Average usage 10 hours 39 minutes.  Minimum pressure 7 cmH2O, maximum pressure 12 cm water.  Leak in the 95th percentile 10.8, AHI 2.5.  HISTORY  05/02/2020 Dr. Jannifer Franklin: [name] is a 50 year old right-handed black female with a history of intractable seizures.  In the past, the patient has been on Keppra, Vimpat, and carbamazepine and eventually was switched to Lamictal which seems to be well-tolerated and effective.  The patient has a history of a right posterior frontal lobe cortical infarction.  She is followed through psychiatry, she is on Abilify currently.  She reports an occasional tremor while holding  objects with her left hand.  Her last seizure event was on 05 January 2020.  She returns for an evaluation.  She currently is on lamotrigine 100 mg in the morning and 150 mg in the evening, she never went up to 150 mg twice daily as instructed.  REVIEW OF SYSTEMS: Out of a complete 14 system review of symptoms, the patient complains only of the following symptoms, and all other reviewed systems are negative.  Seizures  ALLERGIES: Allergies  Allergen Reactions  . Amoxicillin Itching    Did it involve swelling of the face/tongue/throat, SOB, or low BP? No Did it involve sudden or severe rash/hives, skin peeling, or any reaction on the inside of your mouth or nose? No Did you need to seek medical attention at a hospital or doctor's office? No When did it last happen?within the past 10 years If all above answers are "NO", may proceed with cephalosporin use.   Marland Kitchen Lisinopril Swelling    Angioedema   . Hydrocodone Other (See Comments)    Depressed   . Tegretol [Carbamazepine] Swelling    Throat swells    HOME MEDICATIONS: Outpatient Medications Prior to Visit  Medication Sig Dispense Refill  . acetaminophen (TYLENOL) 325 MG tablet Take 2 tablets (650 mg total) by mouth every 4 (four) hours as needed for mild pain (or temp > 37.5 C (99.5 F)).    Marland Kitchen ARIPiprazole (ABILIFY) 5 MG tablet 1 qam 30 tablet 7  . atorvastatin (LIPITOR) 40 MG tablet TAKE 1 TABLET (40 MG  TOTAL) BY MOUTH DAILY AT 6 PM FOR CHOLESTEROL 90 tablet 0  . carvedilol (COREG) 3.125 MG tablet Take 3.125 mg by mouth 2 (two) times daily.    . Dulaglutide (TRULICITY) [id number] 0000000 SOPN Inject 0.75 mg into the skin once a week. 6 mL 5  . guaiFENesin-dextromethorphan (ROBITUSSIN DM) 100-10 MG/5ML syrup Take 10 mLs by mouth every 4 (four) hours as needed for cough. 118 mL 0  . ipratropium (ATROVENT) 0.06 % nasal spray Place 2 sprays into both nostrils 3 (three) times daily as needed for rhinitis. 15 mL 12  . torsemide (DEMADEX) 10  MG tablet Take 10 mg by mouth daily.    Marland Kitchen lamoTRIgine (LAMICTAL) 100 MG tablet Take 100 mg by mouth 2 (two) times daily.    Marland Kitchen glipiZIDE (GLUCOTROL) 5 MG tablet Take 1 tablet (5 mg total) by mouth 2 (two) times daily with a meal. 180 tablet 1  . hydrALAZINE (APRESOLINE) 10 MG tablet Take 1 tablet (10 mg total) by mouth 2 (two) times daily. 180 tablet 3  . isosorbide dinitrate (ISORDIL) 10 MG tablet Take 1 tablet (10 mg total) by mouth 2 (two) times daily. 180 tablet 3   No facility-administered medications prior to visit.    PAST MEDICAL HISTORY: Past Medical History:  Diagnosis Date  . Anemia   . Anxiety   . Bipolar 1 disorder ([place])   . Common migraine 05/19/2015  . Depression   . Diabetes mellitus, type II ([place])   . Hypertension   . Irritable bowel syndrome (IBS)   . Mild mental retardation   . Obesity   . Partial complex seizure disorder with intractable epilepsy ([place]) 05/12/2014  . Seizures ([place])    intractable, sz 08/23/17  . Sleep apnea   . Stroke ([place])   . Type II or unspecified type diabetes mellitus without mention of complication, not stated as uncontrolled     PAST SURGICAL HISTORY: Past Surgical History:  Procedure Laterality Date  . BUBBLE STUDY  11/15/2019   Procedure: BUBBLE STUDY;  Surgeon: Dorothy Spark, MD;  Location: [place];  Service: Cardiovascular;;  . COLONOSCOPY     2012-normal , Dr Sharlett Iles  . ESOPHAGOGASTRODUODENOSCOPY     normal-Dr Patterson 2012  . LOOP RECORDER INSERTION N/A 05/30/2017   Procedure: Loop Recorder Insertion;  Surgeon: Constance Haw, MD;  Location: [place] CV LAB;  Service: Cardiovascular;  Laterality: N/A;  . LOOP RECORDER REMOVAL N/A 03/04/2018   Procedure: LOOP RECORDER REMOVAL;  Surgeon: Constance Haw, MD;  Location: [place] [facility];  Service: Cardiovascular;  Laterality: N/A;  . MYRINGOTOMY WITH TUBE PLACEMENT    . MYRINGOTOMY WITH TUBE PLACEMENT Right 11/05/2017   Procedure: MYRINGOTOMY WITH TUBE  PLACEMENT;  Surgeon: Melissa Montane, MD;  Location: [place];  Service: ENT;  Laterality: Right;  right T Tube placement  . NASAL SINUS SURGERY    . TEE WITHOUT CARDIOVERSION N/A 05/30/2017   Procedure: TRANSESOPHAGEAL ECHOCARDIOGRAM (TEE);  Surgeon: Acie Fredrickson Wonda Cheng, MD;  Location: [place];  Service: Cardiovascular;  Laterality: N/A;  . TEE WITHOUT CARDIOVERSION N/A 11/15/2019   Procedure: TRANSESOPHAGEAL ECHOCARDIOGRAM (TEE);  Surgeon: Dorothy Spark, MD;  Location: [facility] ENDOSCOPY;  Service: Cardiovascular;  Laterality: N/A;    FAMILY HISTORY: Family History  Problem Relation Age of Onset  . Diabetes Mother        passed away from accidental death  . Hypertension Mother   . Bipolar disorder Father   . Diabetes Daughter   .  Leukemia Daughter   . Ovarian cancer Maternal Aunt     SOCIAL HISTORY: Social History   Socioeconomic History  . Marital status: Single    Spouse name: [name]  . Number of children: 3  . Years of education: 67  . Highest education level: Not on file  Occupational History  . Occupation: disabled    Employer: DISABLED  Tobacco Use  . Smoking status: Never Smoker  . Smokeless tobacco: Never Used  Vaping Use  . Vaping Use: Never used  Substance and Sexual Activity  . Alcohol use: No  . Drug use: No  . Sexual activity: Never  Other Topics Concern  . Not on file  Social History Narrative   Patient lives at home with daughter.    Patient has 3 children.    Patient is right handed.    Patient has a high school education.    Patient is on disability   Patient drinks 2 cups of caffeine daily.   Social Determinants of Health   Financial Resource Strain: Not on file  Food Insecurity: No Food Insecurity  . Worried About Charity fundraiser in the Last Year: Never true  . Ran Out of Food in the Last Year: Never true  Transportation Needs: No Transportation Needs  . Lack of Transportation (Medical): No  . Lack of Transportation (Non-Medical): No   Physical Activity: Not on file  Stress: Not on file  Social Connections: Not on file  Intimate Partner Violence: Not on file   PHYSICAL EXAM  Vitals:   01/04/21 1030  BP: 134/74  Pulse: 83  Weight: (!) 310 lb (140.6 kg)  Height: 5\' 1"  (1.549 m)   Body mass index is 58.57 kg/m.  Generalized: Well developed, in no acute distress, markedly obese Neurological examination  Mentation: Alert oriented to time, place, poor historian.  Follows all commands speech and language fluent Cranial nerve II-XII: Pupils were equal round reactive to light. Extraocular movements were full, visual field were full on confrontational test. Facial sensation and strength were normal. Head turning and shoulder shrug were normal and symmetric. Motor: Good strength all extremities Sensory: Sensory testing is intact to soft touch on all 4 extremities. No evidence of extinction is noted.  Coordination: Cerebellar testing reveals good finger-nose-finger and heel-to-shin bilaterally.  Gait and station: Gait is steady, slightly wide-based, limited by large body habitus Reflexes: Deep tendon reflexes are symmetric   DIAGNOSTIC DATA (LABS, IMAGING, TESTING) - I reviewed patient records, labs, notes, testing and imaging myself where available.  Lab Results  Component Value Date   WBC 7.7 11/04/2020   HGB 9.5 (L) 11/04/2020   HCT 31.2 (L) 11/04/2020   MCV 86.2 11/04/2020   PLT 378 11/04/2020      Component Value Date/Time   NA 137 11/04/2020 0422   NA 136 08/29/2020 0955   K 3.9 11/04/2020 0422   CL 103 11/04/2020 0422   CO2 25 11/04/2020 0422   GLUCOSE 207 (H) 11/04/2020 0422   BUN 22 (H) 11/04/2020 0422   BUN 19 08/29/2020 0955   CREATININE 1.18 (H) 11/04/2020 0422   CREATININE 0.72 01/30/2015 1241   CALCIUM 8.2 (L) 11/04/2020 0422   PROT 6.9 11/04/2020 0422   PROT 6.6 08/29/2020 0955   ALBUMIN 2.9 (L) 11/04/2020 0422   ALBUMIN 3.6 (L) 08/29/2020 0955   AST 26 11/04/2020 0422   ALT 25  11/04/2020 0422   ALKPHOS 64 11/04/2020 0422   BILITOT 0.5 11/04/2020 0422   BILITOT <  0.2 08/29/2020 0955   GFRNONAA 56 (L) 11/04/2020 0422   GFRNONAA >89 01/30/2015 1241   GFRAA 51 (L) 08/29/2020 0955   GFRAA >89 01/30/2015 1241   Lab Results  Component Value Date   CHOL 108 04/18/2020   HDL 52 04/18/2020   LDLCALC 44 04/18/2020   TRIG 100 10/31/2020   CHOLHDL 2.1 04/18/2020   Lab Results  Component Value Date   HGBA1C 8.9 (A) 11/20/2020   Lab Results  Component Value Date   VITAMINB12 379 05/13/2014   Lab Results  Component Value Date   TSH 1.220 02/10/2018   ASSESSMENT AND PLAN 50 y.o. year old female  has a past medical history of Anemia, Anxiety, Bipolar 1 disorder ([place]), Common migraine (05/19/2015), Depression, Diabetes mellitus, type II ([place]), Hypertension, Irritable bowel syndrome (IBS), Mild mental retardation, Obesity, Partial complex seizure disorder with intractable epilepsy ([place]) (05/12/2014), Seizures ([place]), Sleep apnea, Stroke ([place]), and Type II or unspecified type diabetes mellitus without mention of complication, not stated as uncontrolled. here with:  1.  Morbid obesity 2.  Intractable seizures 3.  OSA on CPAP  She had a recent seizure event on Lamictal 150 mg twice a day.  I will increase the dose to 200 mg twice a day (increase by 50 mg every 2 weeks).  Check routine lab work today.  She does appear compliant with CPAP, was last seen in July by Amy for CPAP, got a new machine.  She does not drive.  She has previously been felt to be a potential epilepsy surgery candidate, she has decided against this, never had consultation. She will call for recurrent seizure, otherwise follow-up 6 months or sooner if needed.  I will send in Lamictal 200 mg tablet,  but she will take 150 mg tablet in the morning, 200 mg in the evening for 2 weeks, then increase to 200 mg twice daily tablet. (I called [name] and went over detailed instructions).  Her CPAP download, indicates  excellent compliance.  However, it appears she missed the initial window for office visit with new machine. My nurse is checking on this process in order for her CPAP use to not be interrupted.  I spent 30 minutes of face-to-face and non-face-to-face time with patient.  This included previsit chart review, lab review, study review, order entry, electronic health record documentation, patient education.  Butler Denmark, AGNP-C, DNP 01/04/2021, 11:55 AM [facility] [address], [place], [place] [address] [phone]

## 2021-01-04 NOTE — Patient Instructions (Signed)
Check labs today  Stay on Lamictal  Will check CPAP download

## 2021-01-05 LAB — COMPREHENSIVE METABOLIC PANEL
ALT: 12 IU/L (ref 0–32)
AST: 10 IU/L (ref 0–40)
Albumin/Globulin Ratio: 1.3 (ref 1.2–2.2)
Albumin: 3.8 g/dL (ref 3.8–4.9)
Alkaline Phosphatase: 115 IU/L (ref 44–121)
BUN/Creatinine Ratio: 12 (ref 9–23)
BUN: 15 mg/dL (ref 6–24)
Bilirubin Total: 0.3 mg/dL (ref 0.0–1.2)
CO2: 23 mmol/L (ref 20–29)
Calcium: 8.9 mg/dL (ref 8.7–10.2)
Chloride: 102 mmol/L (ref 96–106)
Creatinine, Ser: 1.27 mg/dL — ABNORMAL HIGH (ref 0.57–1.00)
GFR calc Af Amer: 56 mL/min/{1.73_m2} — ABNORMAL LOW (ref >59)
GFR calc non Af Amer: 49 mL/min/{1.73_m2} — ABNORMAL LOW (ref >59)
Globulin, Total: 3 g/dL (ref 1.5–4.5)
Glucose: 193 mg/dL — ABNORMAL HIGH (ref 65–99)
Potassium: 4 mmol/L (ref 3.5–5.2)
Sodium: 139 mmol/L (ref 134–144)
Total Protein: 6.8 g/dL (ref 6.0–8.5)

## 2021-01-05 LAB — CBC WITH DIFFERENTIAL/PLATELET
Basophils Absolute: 0 10*3/uL (ref 0.0–0.2)
Basos: 0 %
EOS (ABSOLUTE): 0.3 10*3/uL (ref 0.0–0.4)
Eos: 3 %
Hematocrit: 31.1 % — ABNORMAL LOW (ref 34.0–46.6)
Hemoglobin: 9.9 g/dL — ABNORMAL LOW (ref 11.1–15.9)
Immature Grans (Abs): 0 10*3/uL (ref 0.0–0.1)
Immature Granulocytes: 0 %
Lymphocytes Absolute: 2 10*3/uL (ref 0.7–3.1)
Lymphs: 24 %
MCH: 26.3 pg — ABNORMAL LOW (ref 26.6–33.0)
MCHC: 31.8 g/dL (ref 31.5–35.7)
MCV: 83 fL (ref 79–97)
Monocytes Absolute: 0.4 10*3/uL (ref 0.1–0.9)
Monocytes: 5 %
Neutrophils Absolute: 5.6 10*3/uL (ref 1.4–7.0)
Neutrophils: 68 %
Platelets: 381 10*3/uL (ref 150–450)
RBC: 3.76 x10E6/uL — ABNORMAL LOW (ref 3.77–5.28)
RDW: 15.6 % — ABNORMAL HIGH (ref 11.7–15.4)
WBC: 8.3 10*3/uL (ref 3.4–10.8)

## 2021-01-05 LAB — LAMOTRIGINE LEVEL: Lamotrigine Lvl: 6.5 ug/mL (ref 2.0–20.0)

## 2021-01-09 DIAGNOSIS — U071 COVID-19: Secondary | ICD-10-CM | POA: Diagnosis not present

## 2021-01-09 DIAGNOSIS — R569 Unspecified convulsions: Secondary | ICD-10-CM | POA: Diagnosis not present

## 2021-01-09 DIAGNOSIS — G4733 Obstructive sleep apnea (adult) (pediatric): Secondary | ICD-10-CM | POA: Diagnosis not present

## 2021-01-09 DIAGNOSIS — M545 Low back pain, unspecified: Secondary | ICD-10-CM | POA: Diagnosis not present

## 2021-01-10 ENCOUNTER — Telehealth: Payer: Self-pay

## 2021-01-10 NOTE — Telephone Encounter (Signed)
Attempted to call pt, LVM for results per DPR. °Ask pt to call back for questions or concerns.  °

## 2021-01-10 NOTE — Telephone Encounter (Signed)
-----   Message from [name], NP sent at 01/08/2021  4:05 PM EST ----- Labs shows stable low hemoglobin 9.9, mildly elevated creatinine 1.27, random glucose was elevated 193, Lamictal level was 6.5 which is within the therapeutic range of 2-20.  Please continue with dose increase due to recent reported seizure.

## 2021-01-17 ENCOUNTER — Telehealth (INDEPENDENT_AMBULATORY_CARE_PROVIDER_SITE_OTHER): Payer: Medicare HMO | Admitting: Psychiatry

## 2021-01-17 ENCOUNTER — Other Ambulatory Visit: Payer: Self-pay

## 2021-01-17 DIAGNOSIS — F22 Delusional disorders: Secondary | ICD-10-CM | POA: Diagnosis not present

## 2021-01-17 MED ORDER — ARIPIPRAZOLE 5 MG PO TABS
ORAL_TABLET | ORAL | 7 refills | Status: DC
Start: 2021-01-17 — End: 2021-04-10

## 2021-01-17 NOTE — Progress Notes (Signed)
Psychiatric Initial Adult Assessment   Patient Identification: Brandi Gomez MRN:  [id number] Date of Evaluation:  01/17/2021 Referral Source: Dr. Tory Emerald Chief Complaint:  Suspiciousness/paranoia Visit Diagnosis: Mood disorder secondary to CVA  Today the patient is doing only fair.  She had a bad day.  She woke up feeling depressed.  Today we spoke to her about recurrent [name].  The reality is the patient is been fine for the last few months.  She is not really felt depressed at all.  Today for some reason acutely she felt depressed.  In close evaluation the patient also shares that she has been urinating a great deal.  This is new.  She has a dry mouth.  I do not think her diabetes is well controlled.  The patient is morbidly obese.  Yesterday the days before she felt fine.  So this time she does not feel well and she is urinating too much.  She is not having any dysuria.  She denies any chest pain or shortness of breath.  She denies any neurological symptoms.  She is not had a seizure in many months.  The patient had COVID in the past.  The patient says she is sleeping and eating fairly well.  She drinks no alcohol uses no drugs.  Importantly she denies any psychotic symptoms at all.  Also importantly as she is not made attempts to leave the home.  Over the years I have known her to abandon everything and get into the streets and walk around.  She has not done any disorder behavior like that.  We have says she is no longer acting like that.  He says she is actually doing quite well.  She takes her 5 mg of Abilify as prescribed.  We are considering the possibility of possibly reducing it but at this time we will not.  She continues seeing Dr. Eugenie Birks for her seizure disorder.  Other than today the patient is actually doing quite well.   (Hypo) Manic Symptoms:   Anxiety Symptoms:   Psychotic Symptoms:   PTSD Symptoms:   Past Psychiatric History: Cymbalta 30 mg Previous Psychotropic Medications:  Cymbalta 30 mg  Substance Abuse History in the last 12 months:  No.  Consequences of Substance Abuse:   Past Medical History:  Past Medical History:  Diagnosis Date  . Anemia   . Anxiety   . Bipolar 1 disorder ([place])   . Common migraine 05/19/2015  . Depression   . Diabetes mellitus, type II ([place])   . Hypertension   . Irritable bowel syndrome (IBS)   . Mild mental retardation   . Obesity   . Partial complex seizure disorder with intractable epilepsy ([place]) 05/12/2014  . Seizures ([place])    intractable, sz 08/23/17  . Sleep apnea   . Stroke ([place])   . Type II or unspecified type diabetes mellitus without mention of complication, not stated as uncontrolled     Past Surgical History:  Procedure Laterality Date  . BUBBLE STUDY  11/15/2019   Procedure: BUBBLE STUDY;  Surgeon: Dorothy Spark, MD;  Location: [facility];  Service: Cardiovascular;;  . COLONOSCOPY     2012-normal , Dr Sharlett Iles  . ESOPHAGOGASTRODUODENOSCOPY     normal-Dr Patterson 2012  . LOOP RECORDER INSERTION N/A 05/30/2017   Procedure: Loop Recorder Insertion;  Surgeon: Constance Haw, MD;  Location: [facility];  Service: Cardiovascular;  Laterality: N/A;  . LOOP RECORDER REMOVAL N/A 03/04/2018   Procedure: LOOP RECORDER  REMOVAL;  Surgeon: Constance Haw, MD;  Location: [facility];  Service: Cardiovascular;  Laterality: N/A;  . MYRINGOTOMY WITH TUBE PLACEMENT    . MYRINGOTOMY WITH TUBE PLACEMENT Right 11/05/2017   Procedure: MYRINGOTOMY WITH TUBE PLACEMENT;  Surgeon: Melissa Montane, MD;  Location: [facility];  Service: ENT;  Laterality: Right;  right T Tube placement  . NASAL SINUS SURGERY    . TEE WITHOUT CARDIOVERSION N/A 05/30/2017   Procedure: TRANSESOPHAGEAL ECHOCARDIOGRAM (TEE);  Surgeon: Acie Fredrickson Wonda Cheng, MD;  Location: [facility];  Service: Cardiovascular;  Laterality: N/A;  . TEE WITHOUT CARDIOVERSION N/A 11/15/2019   Procedure: TRANSESOPHAGEAL ECHOCARDIOGRAM (TEE);  Surgeon: Dorothy Spark, MD;  Location: [facility];  Service: Cardiovascular;  Laterality: N/A;    Family Psychiatric History:   Family History:  Family History  Problem Relation Age of Onset  . Diabetes Mother        passed away from accidental death  . Hypertension Mother   . Bipolar disorder Father   . Diabetes Daughter   . Leukemia Daughter   . Ovarian cancer Maternal Aunt     Social History:   Social History   Socioeconomic History  . Marital status: Single    Spouse name: [name]  . Number of children: 3  . Years of education: 83  . Highest education level: Not on file  Occupational History  . Occupation: disabled    Employer: DISABLED  Tobacco Use  . Smoking status: Never Smoker  . Smokeless tobacco: Never Used  Vaping Use  . Vaping Use: Never used  Substance and Sexual Activity  . Alcohol use: No  . Drug use: No  . Sexual activity: Never  Other Topics Concern  . Not on file  Social History Narrative   Patient lives at home with daughter.    Patient has 3 children.    Patient is right handed.    Patient has a high school education.    Patient is on disability   Patient drinks 2 cups of caffeine daily.   Social Determinants of Health   Financial Resource Strain: Not on file  Food Insecurity: No Food Insecurity  . Worried About Charity fundraiser in the Last Year: Never true  . Ran Out of Food in the Last Year: Never true  Transportation Needs: No Transportation Needs  . Lack of Transportation (Medical): No  . Lack of Transportation (Non-Medical): No  Physical Activity: Not on file  Stress: Not on file  Social Connections: Not on file    Additional Social History:   Allergies:   Allergies  Allergen Reactions  . Amoxicillin Itching    Did it involve swelling of the face/tongue/throat, SOB, or low BP? No Did it involve sudden or severe rash/hives, skin peeling, or any reaction on the inside of your mouth or nose? No Did you need to seek medical attention  at a hospital or doctor's office? No When did it last happen?within the past 10 years If all above answers are "NO", may proceed with cephalosporin use.   Marland Kitchen Lisinopril Swelling    Angioedema   . Hydrocodone Other (See Comments)    Depressed   . Tegretol [Carbamazepine] Swelling    Throat swells    Metabolic Disorder Labs: Lab Results  Component Value Date   HGBA1C 8.9 (A) 11/20/2020   MPG 180.03 10/31/2020   MPG 139.85 10/12/2019   No results found for: PROLACTIN Lab Results  Component Value Date   CHOL 108  04/18/2020   TRIG 100 10/31/2020   HDL 52 04/18/2020   CHOLHDL 2.1 04/18/2020   VLDL 10 10/12/2019   LDLCALC 44 04/18/2020   LDLCALC 64 10/12/2019     Current Medications: Current Outpatient Medications  Medication Sig Dispense Refill  . acetaminophen (TYLENOL) 325 MG tablet Take 2 tablets (650 mg total) by mouth every 4 (four) hours as needed for mild pain (or temp > 37.5 C (99.5 F)).    Marland Kitchen ARIPiprazole (ABILIFY) 5 MG tablet 1 qam 30 tablet 7  . atorvastatin (LIPITOR) 40 MG tablet TAKE 1 TABLET (40 MG TOTAL) BY MOUTH DAILY AT 6 PM FOR CHOLESTEROL 90 tablet 0  . carvedilol (COREG) 3.125 MG tablet Take 3.125 mg by mouth 2 (two) times daily.    . Dulaglutide (TRULICITY) 0.63 KZ/6.0FU SOPN Inject 0.75 mg into the skin once a week. 6 mL 5  . glipiZIDE (GLUCOTROL) 5 MG tablet Take 1 tablet (5 mg total) by mouth 2 (two) times daily with a meal. 180 tablet 1  . guaiFENesin-dextromethorphan (ROBITUSSIN DM) 100-10 MG/5ML syrup Take 10 mLs by mouth every 4 (four) hours as needed for cough. 118 mL 0  . hydrALAZINE (APRESOLINE) 10 MG tablet Take 1 tablet (10 mg total) by mouth 2 (two) times daily. 180 tablet 3  . ipratropium (ATROVENT) 0.06 % nasal spray Place 2 sprays into both nostrils 3 (three) times daily as needed for rhinitis. 15 mL 12  . isosorbide dinitrate (ISORDIL) 10 MG tablet Take 1 tablet (10 mg total) by mouth 2 (two) times daily. 180 tablet 3  . lamoTRIgine  (LAMICTAL) 200 MG tablet Take 1 tablet (200 mg total) by mouth 2 (two) times daily. 60 tablet 5  . torsemide (DEMADEX) 10 MG tablet Take 10 mg by mouth daily.     No current facility-administered medications for this visit.    Neurologic: Headache: No Seizure: Yes Paresthesias:No  Musculoskeletal: Strength & Muscle Tone: within normal limits Gait & Station: normal Patient leans: Backward and N/A  Psychiatric Specialty Exam: ROS  There were no vitals taken for this visit.There is no height or weight on file to calculate BMI.  General Appearance: Casual  Eye Contact:  Fair  Speech:  Slurred  Volume:  Normal  Mood:  Dysphoric  Affect:  Appropriate  Thought Process:  Goal Directed  Orientation:  Full (Time, Place, and Person)  Thought Content:  Logical  Suicidal Thoughts:  No  Homicidal Thoughts:  No  Memory:  Negative  Judgement:  Fair  Insight:  Lacking  Psychomotor Activity:  Decreased  Concentration:    Recall:  [place] of Knowledge:Fair  Language: Fair  Akathisia:  No  Handed:    AIMS (if indicated):    Assets:  Desire for Improvement  ADL's:  Intact  Cognition: WNL  Sleep:      Treatment Plan Summary:  This patient is diagnosed with a delusional disorder.  I suspect at some level there is some degree of dementia.  The patient's had multiple strokes in the past.  She has a seizure disorder as well.  She takes Abilify for psychotic symptomatology which is absent at this time.  We will continue the 5 mg of Abilify.  Today the patient also agreed to contact her primary care doctor.  It sounds like she is having some discomfort that might be related to hyperglycemia.  Patient says she will go ahead and contact them and return to see me in 1 to 2 months.  When  she returns she will come with [name].

## 2021-01-18 ENCOUNTER — Encounter: Payer: Self-pay | Admitting: Emergency Medicine

## 2021-01-18 ENCOUNTER — Ambulatory Visit (INDEPENDENT_AMBULATORY_CARE_PROVIDER_SITE_OTHER): Payer: Medicare HMO | Admitting: Emergency Medicine

## 2021-01-18 ENCOUNTER — Other Ambulatory Visit: Payer: Self-pay

## 2021-01-18 ENCOUNTER — Ambulatory Visit: Payer: Medicare HMO | Admitting: Emergency Medicine

## 2021-01-18 ENCOUNTER — Other Ambulatory Visit: Payer: Self-pay | Admitting: Emergency Medicine

## 2021-01-18 VITALS — BP 131/85 | HR 95 | Temp 97.4°F | Resp 16 | Ht 61.0 in | Wt 305.0 lb

## 2021-01-18 DIAGNOSIS — Z6841 Body Mass Index (BMI) 40.0 and over, adult: Secondary | ICD-10-CM

## 2021-01-18 DIAGNOSIS — R350 Frequency of micturition: Secondary | ICD-10-CM | POA: Diagnosis not present

## 2021-01-18 DIAGNOSIS — I5042 Chronic combined systolic (congestive) and diastolic (congestive) heart failure: Secondary | ICD-10-CM

## 2021-01-18 DIAGNOSIS — B999 Unspecified infectious disease: Secondary | ICD-10-CM | POA: Diagnosis not present

## 2021-01-18 DIAGNOSIS — E1159 Type 2 diabetes mellitus with other circulatory complications: Secondary | ICD-10-CM | POA: Diagnosis not present

## 2021-01-18 DIAGNOSIS — I152 Hypertension secondary to endocrine disorders: Secondary | ICD-10-CM

## 2021-01-18 DIAGNOSIS — I69934 Monoplegia of upper limb following unspecified cerebrovascular disease affecting left non-dominant side: Secondary | ICD-10-CM

## 2021-01-18 LAB — POCT URINALYSIS DIP (MANUAL ENTRY)
Bilirubin, UA: NEGATIVE
Blood, UA: NEGATIVE
Glucose, UA: NEGATIVE mg/dL
Ketones, POC UA: NEGATIVE mg/dL
Leukocytes, UA: NEGATIVE
Nitrite, UA: NEGATIVE
Protein Ur, POC: NEGATIVE mg/dL
Spec Grav, UA: >=1.03 — AB (ref 1.010–1.025)
Urobilinogen, UA: 0.2 E.U./dL
pH, UA: 5.5 (ref 5.0–8.0)

## 2021-01-18 LAB — POC MICROSCOPIC URINALYSIS (UMFC): Mucus: ABSENT

## 2021-01-18 LAB — POCT GLYCOSYLATED HEMOGLOBIN (HGB A1C): Hemoglobin A1C: 7.9 % — AB (ref 4.0–5.6)

## 2021-01-18 LAB — GLUCOSE, POCT (MANUAL RESULT ENTRY): POC Glucose: 178 mg/dl — AB (ref 70–99)

## 2021-01-18 MED ORDER — TRULICITY 1.5 MG/0.5ML ~~LOC~~ SOAJ: 1.5000 mg | SUBCUTANEOUS | 5 refills | Status: DC

## 2021-01-18 MED ORDER — SULFAMETHOXAZOLE-TRIMETHOPRIM 800-160 MG PO TABS: 1.0000 | ORAL_TABLET | Freq: Two times a day (BID) | ORAL | 0 refills | Status: AC

## 2021-01-18 NOTE — Progress Notes (Signed)
Brandi Gomez 50 y.o.   Chief Complaint  Patient presents with  . Urinary Frequency    Per patient for a week with a smell and urine is dark    HISTORY OF PRESENT ILLNESS: This is a 50 y.o. female complaining of urinary frequency and bad smell for the past week. States she is not sexually active.  Denies vaginal discharge or abnormal vaginal bleeding. Denies fever or chills.  Denies abdominal or pelvic pain. No other complaints or medical concerns today.  HPI   Prior to Admission medications   Medication Sig Start Date End Date Taking? Authorizing Provider  acetaminophen (TYLENOL) 325 MG tablet Take 2 tablets (650 mg total) by mouth every 4 (four) hours as needed for mild pain (or temp > 37.5 C (99.5 F)). 10/19/19  Yes Angiulli, Lavon Paganini, PA-C  ARIPiprazole (ABILIFY) 5 MG tablet 1 qam 01/17/21  Yes Plovsky, Berneta Sages, MD  atorvastatin (LIPITOR) 40 MG tablet TAKE 1 TABLET (40 MG TOTAL) BY MOUTH DAILY AT 6 PM FOR CHOLESTEROL 11/02/20  Yes Sagardia, Ines Bloomer, MD  carvedilol (COREG) 3.125 MG tablet Take 3.125 mg by mouth 2 (two) times daily. 09/23/20  Yes [provider]  Dulaglutide (TRULICITY) 4.97 WY/6.3ZC SOPN Inject 0.75 mg into the skin once a week. 11/20/20  Yes Sagardia, Ines Bloomer, MD  guaiFENesin-dextromethorphan [facility] DM) 100-10 MG/5ML syrup Take 10 mLs by mouth every 4 (four) hours as needed for cough. 11/04/20  Yes Caren Griffins, MD  lamoTRIgine (LAMICTAL) 200 MG tablet Take 1 tablet (200 mg total) by mouth 2 (two) times daily. 01/04/21  Yes [name], NP  glipiZIDE (GLUCOTROL) 5 MG tablet Take 1 tablet (5 mg total) by mouth 2 (two) times daily with a meal. 08/29/20 11/27/20  Sagardia, Ines Bloomer, MD  hydrALAZINE (APRESOLINE) 10 MG tablet Take 1 tablet (10 mg total) by mouth 2 (two) times daily. 01/04/20 10/31/20  Horald Pollen, MD  ipratropium (ATROVENT) 0.06 % nasal spray Place 2 sprays into both nostrils 3 (three) times daily as needed for  rhinitis. Patient not taking: Reported on 01/18/2021 10/27/20   Augusto Gamble B, NP  isosorbide dinitrate (ISORDIL) 10 MG tablet Take 1 tablet (10 mg total) by mouth 2 (two) times daily. 01/04/20 10/31/20  Horald Pollen, MD  torsemide (DEMADEX) 10 MG tablet Take 10 mg by mouth daily. Patient not taking: Reported on 01/18/2021    [provider]  methylphenidate (RITALIN) 5 MG tablet Take 1 tablet (5 mg total) by mouth 2 (two) times daily. Patient not taking: Reported on 12/29/2019 10/29/19 01/01/20  Izora Ribas, MD  traZODone (DESYREL) 100 MG tablet Take 1 tablet (100 mg total) by mouth at bedtime. Patient not taking: Reported on 12/29/2019 10/19/19 01/01/20  Angiulli, Lavon Paganini, PA-C    Allergies  Allergen Reactions  . Amoxicillin Itching    Did it involve swelling of the face/tongue/throat, SOB, or low BP? No Did it involve sudden or severe rash/hives, skin peeling, or any reaction on the inside of your mouth or nose? No Did you need to seek medical attention at a hospital or doctor's office? No When did it last happen?within the past 10 years If all above answers are "NO", may proceed with cephalosporin use.   Marland Kitchen Lisinopril Swelling    Angioedema   . Hydrocodone Other (See Comments)    Depressed   . Tegretol [Carbamazepine] Swelling    Throat swells    Patient Active Problem List   Diagnosis Date  Noted  . COVID-19 10/31/2020  . Morbid (severe) obesity due to excess calories ([place]) 04/18/2020  . HLD (hyperlipidemia) 10/12/2019  . CKD (chronic kidney disease) stage 3, GFR 30-59 ml/min (HCC) 10/12/2019  . Chronic combined systolic and diastolic heart failure ([place])   . Depression 07/14/2019  . Bipolar disorder ([place]) 05/13/2019  . Bipolar 1 disorder ([place]) 05/13/2019  . Adjustment disorder with anxiety   . History of recent stroke 08/06/2017  . OSA (obstructive sleep apnea)   . Controlled type 2 diabetes mellitus without complication, without long-term  current use of insulin ([place]) 05/08/2017  . Hypertension associated with diabetes ([place]) 07/27/2008  . Seizure disorder ([place]) 02/05/2007    Past Medical History:  Diagnosis Date  . Anemia   . Anxiety   . Bipolar 1 disorder ([place])   . Common migraine 05/19/2015  . Depression   . Diabetes mellitus, type II ([place])   . Hypertension   . Irritable bowel syndrome (IBS)   . Mild mental retardation   . Obesity   . Partial complex seizure disorder with intractable epilepsy ([place]) 05/12/2014  . Seizures ([place])    intractable, sz 08/23/17  . Sleep apnea   . Stroke ([place])   . Type II or unspecified type diabetes mellitus without mention of complication, not stated as uncontrolled     Past Surgical History:  Procedure Laterality Date  . BUBBLE STUDY  11/15/2019   Procedure: BUBBLE STUDY;  Surgeon: Dorothy Spark, MD;  Location: [facility];  Service: Cardiovascular;;  . COLONOSCOPY     2012-normal , Dr Sharlett Iles  . ESOPHAGOGASTRODUODENOSCOPY     normal-Dr Patterson 2012  . LOOP RECORDER INSERTION N/A 05/30/2017   Procedure: Loop Recorder Insertion;  Surgeon: Constance Haw, MD;  Location: [facility];  Service: Cardiovascular;  Laterality: N/A;  . LOOP RECORDER REMOVAL N/A 03/04/2018   Procedure: LOOP RECORDER REMOVAL;  Surgeon: Constance Haw, MD;  Location: [place];  Service: Cardiovascular;  Laterality: N/A;  . MYRINGOTOMY WITH TUBE PLACEMENT    . MYRINGOTOMY WITH TUBE PLACEMENT Right 11/05/2017   Procedure: MYRINGOTOMY WITH TUBE PLACEMENT;  Surgeon: Melissa Montane, MD;  Location: [facility];  Service: ENT;  Laterality: Right;  right T Tube placement  . NASAL SINUS SURGERY    . TEE WITHOUT CARDIOVERSION N/A 05/30/2017   Procedure: TRANSESOPHAGEAL ECHOCARDIOGRAM (TEE);  Surgeon: Acie Fredrickson Wonda Cheng, MD;  Location: [facility];  Service: Cardiovascular;  Laterality: N/A;  . TEE WITHOUT CARDIOVERSION N/A 11/15/2019   Procedure: TRANSESOPHAGEAL ECHOCARDIOGRAM (TEE);  Surgeon:  Dorothy Spark, MD;  Location: [facility] ENDOSCOPY;  Service: Cardiovascular;  Laterality: N/A;    Social History   Socioeconomic History  . Marital status: Single    Spouse name: [name]  . Number of children: 3  . Years of education: 16  . Highest education level: Not on file  Occupational History  . Occupation: disabled    Employer: DISABLED  Tobacco Use  . Smoking status: Never Smoker  . Smokeless tobacco: Never Used  Vaping Use  . Vaping Use: Never used  Substance and Sexual Activity  . Alcohol use: No  . Drug use: No  . Sexual activity: Never  Other Topics Concern  . Not on file  Social History Narrative   Patient lives at home with daughter.    Patient has 3 children.    Patient is right handed.    Patient has a high school education.    Patient is on disability  Patient drinks 2 cups of caffeine daily.   Social Determinants of Health   Financial Resource Strain: Not on file  Food Insecurity: No Food Insecurity  . Worried About Charity fundraiser in the Last Year: Never true  . Ran Out of Food in the Last Year: Never true  Transportation Needs: No Transportation Needs  . Lack of Transportation (Medical): No  . Lack of Transportation (Non-Medical): No  Physical Activity: Not on file  Stress: Not on file  Social Connections: Not on file  Intimate Partner Violence: Not on file    Family History  Problem Relation Age of Onset  . Diabetes Mother        passed away from accidental death  . Hypertension Mother   . Bipolar disorder Father   . Diabetes Daughter   . Leukemia Daughter   . Ovarian cancer Maternal Aunt      Review of Systems  Constitutional: Negative.  Negative for chills and fever.  HENT: Negative for congestion and sore throat.   Respiratory: Negative.  Negative for cough and shortness of breath.   Cardiovascular: Negative.  Negative for chest pain and palpitations.  Gastrointestinal: Negative.  Negative for abdominal pain, diarrhea, nausea  and vomiting.  Genitourinary: Positive for dysuria and frequency.  Musculoskeletal: Negative.  Negative for myalgias and neck pain.  Skin: Negative.  Negative for rash.  Neurological: Negative.  Negative for dizziness and headaches.  All other systems reviewed and are negative.  Today's Vitals   01/18/21 1608  BP: 131/85  Pulse: 95  Resp: 16  Temp: (!) 97.4 F (36.3 C)  TempSrc: Temporal  SpO2: 95%  Weight: (!) 305 lb (138.3 kg)  Height: 5\' 1"  (1.549 m)   Body mass index is 57.63 kg/m. Wt Readings from Last 3 Encounters:  01/18/21 (!) 305 lb (138.3 kg)  01/04/21 (!) 310 lb (140.6 kg)  11/20/20 (!) 305 lb (138.3 kg)     Physical Exam Vitals reviewed.  Constitutional:      Appearance: She is obese.  HENT:     Head: Normocephalic.  Eyes:     Extraocular Movements: Extraocular movements intact.     Pupils: Pupils are equal, round, and reactive to light.  Cardiovascular:     Rate and Rhythm: Normal rate.     Pulses: Normal pulses.     Heart sounds: Normal heart sounds.  Pulmonary:     Effort: Pulmonary effort is normal.     Breath sounds: Normal breath sounds.  Abdominal:     Palpations: Abdomen is soft.     Tenderness: There is no abdominal tenderness.  Musculoskeletal:        General: Normal range of motion.     Cervical back: Normal range of motion.  Skin:    General: Skin is warm and dry.     Capillary Refill: Capillary refill takes less than 2 seconds.  Neurological:     General: No focal deficit present.     Mental Status: She is alert and oriented to person, place, and time.  Psychiatric:        Mood and Affect: Mood normal.        Behavior: Behavior normal.    Results for orders placed or performed in visit on 01/18/21 (from the past 24 hour(s))  POCT urinalysis dipstick     Status: Abnormal   Collection Time: 01/18/21  4:21 PM  Result Value Ref Range   Color, UA yellow yellow   Clarity, UA clear clear  Glucose, UA negative negative mg/dL    Bilirubin, UA negative negative   Ketones, POC UA negative negative mg/dL   Spec Grav, UA >=1.030 (A) 1.010 - 1.025   Blood, UA negative negative   pH, UA 5.5 5.0 - 8.0   Protein Ur, POC negative negative mg/dL   Urobilinogen, UA 0.2 0.2 or 1.0 E.U./dL   Nitrite, UA Negative Negative   Leukocytes, UA Negative Negative  POCT Microscopic Urinalysis ([facility])     Status: Abnormal   Collection Time: 01/18/21  4:22 PM  Result Value Ref Range   WBC,UR,HPF,POC Few (A) None WBC/hpf   RBC,UR,HPF,POC None None RBC/hpf   Bacteria Few (A) None, Too numerous to count   Mucus Absent Absent   Epithelial Cells, UR Per Microscopy Few (A) None, Too numerous to count cells/hpf  POCT glucose (manual entry)     Status: Abnormal   Collection Time: 01/18/21  4:32 PM  Result Value Ref Range   POC Glucose 178 (A) 70 - 99 mg/dl  POCT glycosylated hemoglobin (Hb A1C)     Status: Abnormal   Collection Time: 01/18/21  4:36 PM  Result Value Ref Range   Hemoglobin A1C 7.9 (A) 4.0 - 5.6 %   HbA1c POC (<> result, manual entry)     HbA1c, POC (prediabetic range)     HbA1c, POC (controlled diabetic range)       ASSESSMENT & PLAN: Hypertension associated with diabetes ([place]) Well-controlled hypertension.  Continue present medications. Improved hemoglobin A1c at 7.9.  Continue present medications and increase Trulicity to 1.5 mg weekly. Diet and nutrition discussed. Follow-up in 3 months.  Maddisen was seen today for urinary frequency.  Diagnoses and all orders for this visit:  Urinary frequency -     POCT Microscopic Urinalysis ([facility]) -     POCT urinalysis dipstick -     Urine Culture -     sulfamethoxazole-trimethoprim (BACTRIM DS) 800-160 MG tablet; Take 1 tablet by mouth 2 (two) times daily for 7 days.  Hypertension associated with diabetes ([place]) -     POCT glucose (manual entry) -     POCT glycosylated hemoglobin (Hb A1C) -     Dulaglutide (TRULICITY) 1.5 JK/0.9FG SOPN; Inject 1.5 mg into the skin  once a week.  Clinical infection -     sulfamethoxazole-trimethoprim (BACTRIM DS) 800-160 MG tablet; Take 1 tablet by mouth 2 (two) times daily for 7 days.  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   If you have lab work done today you will be contacted with your lab results within the next 2 weeks.  If you have not heard from [place] then please contact us. The fastest way to get your results is to register for My Chart.   IF you received an x-ray today, you will receive an invoice from [facility]. Please contact [facility] at [phone] with questions or concerns regarding your invoice.   IF you received labwork today, you will receive an invoice from [place]. Please contact [facility] at [phone] with questions or concerns regarding your invoice.   Our billing staff will not be able to assist you with questions regarding bills from these companies.  You will be contacted with the lab  results as soon as they are available. The fastest way to get your results is to activate your My Chart account. Instructions are located on the last page of this paperwork. If you have not heard from [place] regarding the results in 2 weeks, please contact this office.     Urinary Tract Infection, Adult A urinary tract infection (UTI) is an infection of any part of the urinary tract. The urinary tract includes:  The kidneys.  The ureters.  The bladder.  The urethra. These organs make, store, and get rid of pee (urine) in the body. What are the causes? This infection is caused by germs (bacteria) in your genital area. These germs grow and cause swelling  (inflammation) of your urinary tract. What increases the risk? The following factors may make you more likely to develop this condition:  Using a small, thin tube (catheter) to drain pee.  Not being able to control when you pee or poop (incontinence).  Being female. If you are female, these things can increase the risk: ? Using these methods to prevent pregnancy:  A medicine that kills sperm (spermicide).  A device that blocks sperm (diaphragm). ? Having low levels of a female hormone (estrogen). ? Being pregnant. You are more likely to develop this condition if:  You have genes that add to your risk.  You are sexually active.  You take antibiotic medicines.  You have trouble peeing because of: ? A prostate that is bigger than normal, if you are female. ? A blockage in the part of your body that drains pee from the bladder. ? A kidney stone. ? A nerve condition that affects your bladder. ? Not getting enough to drink. ? Not peeing often enough.  You have other conditions, such as: ? Diabetes. ? A weak disease-fighting system (immune system). ? Sickle cell disease. ? Gout. ? Injury of the spine. What are the signs or symptoms? Symptoms of this condition include:  Needing to pee right away.  Peeing small amounts often.  Pain or burning when peeing.  Blood in the pee.  Pee that smells bad or not like normal.  Trouble peeing.  Pee that is cloudy.  Fluid coming from the vagina, if you are female.  Pain in the belly or lower back. Other symptoms include:  Vomiting.  Not feeling hungry.  Feeling mixed up (confused). This may be the first symptom in older adults.  Being tired and grouchy (irritable).  A fever.  Watery poop (diarrhea). How is this treated?  Taking antibiotic medicine.  Taking other medicines.  Drinking enough water. In some cases, you may need to see a specialist. Follow these instructions at home: Medicines  Take  over-the-counter and prescription medicines only as told by your doctor.  If you were prescribed an antibiotic medicine, take it as told by your doctor. Do not stop taking it even if you start to feel better. General instructions  Make sure you: ? Pee until your bladder is empty. ? Do not hold pee for a long time. ? Empty your bladder after sex. ? Wipe from front to back after peeing or pooping if you are a female. Use each tissue one time when you wipe.  Drink enough fluid to keep your pee pale yellow.  Keep all follow-up visits.   Contact a doctor if:  You do not get better after 1-2 days.  Your symptoms go away and then come back. Get help right away if:  You have very bad back  pain.  You have very bad pain in your lower belly.  You have a fever.  You have chills.  You feeling like you will vomit or you vomit. Summary  A urinary tract infection (UTI) is an infection of any part of the urinary tract.  This condition is caused by germs in your genital area.  There are many risk factors for a UTI.  Treatment includes antibiotic medicines.  Drink enough fluid to keep your pee pale yellow. This information is not intended to replace advice given to you by your health care provider. Make sure you discuss any questions you have with your health care provider. Document Revised: 07/07/2020 Document Reviewed: 07/07/2020 Elsevier Patient Education  2021 Elsevier Inc.      Agustina Caroli, MD [facility]

## 2021-01-18 NOTE — Patient Instructions (Addendum)
Called [facility] at [phone] to schedule an appointment to setup a colonoscopy.   If you have lab work done today you will be contacted with your lab results within the next 2 weeks.  If you have not heard from [place] then please contact us. The fastest way to get your results is to register for My Chart.   IF you received an x-ray today, you will receive an invoice from [facility]. Please contact [facility] at [phone] with questions or concerns regarding your invoice.   IF you received labwork today, you will receive an invoice from [place]. Please contact [facility] at [phone] with questions or concerns regarding your invoice.   Our billing staff will not be able to assist you with questions regarding bills from these companies.  You will be contacted with the lab results as soon as they are available. The fastest way to get your results is to activate your My Chart account. Instructions are located on the last page of this paperwork. If you have not heard from [place] regarding the results in 2 weeks, please contact this office.     Urinary Tract Infection, Adult A urinary tract infection (UTI) is an infection of any part of the urinary tract. The urinary tract includes:  The kidneys.  The ureters.  The bladder.  The urethra. These organs make, store, and get rid of pee (urine) in the body. What are the causes? This infection is caused by germs (bacteria) in your genital area. These germs grow and cause swelling (inflammation) of your urinary tract. What increases the risk? The following factors may make you more likely to develop this condition:  Using a small, thin tube (catheter) to drain pee.  Not being able to control when you pee or poop (incontinence).  Being female. If you are female, these things can increase the risk: ? Using these methods to prevent pregnancy:  A medicine that kills sperm (spermicide).  A device that blocks sperm  (diaphragm). ? Having low levels of a female hormone (estrogen). ? Being pregnant. You are more likely to develop this condition if:  You have genes that add to your risk.  You are sexually active.  You take antibiotic medicines.  You have trouble peeing because of: ? A prostate that is bigger than normal, if you are female. ? A blockage in the part of your body that drains pee from the bladder. ? A kidney stone. ? A nerve condition that affects your bladder. ? Not getting enough to drink. ? Not peeing often enough.  You have other conditions, such as: ? Diabetes. ? A weak disease-fighting system (immune system). ? Sickle cell disease. ? Gout. ? Injury of the spine. What are the signs or symptoms? Symptoms of this condition include:  Needing to pee right away.  Peeing small amounts often.  Pain or burning when peeing.  Blood in the pee.  Pee that smells bad or not like normal.  Trouble peeing.  Pee that is cloudy.  Fluid coming from the vagina, if you are female.  Pain in the belly or lower back. Other symptoms include:  Vomiting.  Not feeling hungry.  Feeling mixed up (confused). This may be the first symptom in older adults.  Being tired and grouchy (irritable).  A fever.  Watery poop (diarrhea). How is this treated?  Taking antibiotic medicine.  Taking other medicines.  Drinking enough water. In some cases, you may need to see a specialist. Follow these instructions at home:  Medicines  Take over-the-counter and prescription medicines only as told by your doctor.  If you were prescribed an antibiotic medicine, take it as told by your doctor. Do not stop taking it even if you start to feel better. General instructions  Make sure you: ? Pee until your bladder is empty. ? Do not hold pee for a long time. ? Empty your bladder after sex. ? Wipe from front to back after peeing or pooping if you are a female. Use each tissue one time when you  wipe.  Drink enough fluid to keep your pee pale yellow.  Keep all follow-up visits.   Contact a doctor if:  You do not get better after 1-2 days.  Your symptoms go away and then come back. Get help right away if:  You have very bad back pain.  You have very bad pain in your lower belly.  You have a fever.  You have chills.  You feeling like you will vomit or you vomit. Summary  A urinary tract infection (UTI) is an infection of any part of the urinary tract.  This condition is caused by germs in your genital area.  There are many risk factors for a UTI.  Treatment includes antibiotic medicines.  Drink enough fluid to keep your pee pale yellow. This information is not intended to replace advice given to you by your health care provider. Make sure you discuss any questions you have with your health care provider. Document Revised: 07/07/2020 Document Reviewed: 07/07/2020 Elsevier Patient Education  Brandi Gomez.

## 2021-01-18 NOTE — Assessment & Plan Note (Signed)
Well-controlled hypertension.  Continue present medications. Improved hemoglobin A1c at 7.9.  Continue present medications and increase Trulicity to 1.5 mg weekly. Diet and nutrition discussed. Follow-up in 3 months.

## 2021-01-19 NOTE — Telephone Encounter (Signed)
   Notes to clinic:  Alternative Requested:NOT COVERED.   Requested Prescriptions  Pending Prescriptions Disp Refills   TRULICITY 1.5 EX/5.2WU SOPN [Pharmacy Med Name: TRULICITY 1.5 XL/2.4 ML PEN]  5    Sig: Inject 1.5 mg into the skin once a week.      Endocrinology:  Diabetes - GLP-1 Receptor Agonists Passed - 01/18/2021  5:24 PM      Passed - HBA1C is between 0 and 7.9 and within 180 days    Hemoglobin A1C  Date Value Ref Range Status  01/18/2021 7.9 (A) 4.0 - 5.6 % Final   Hgb A1c MFr Bld  Date Value Ref Range Status  10/31/2020 7.9 (H) 4.8 - 5.6 % Final    Comment:    (NOTE) Pre diabetes:          5.7%-6.4%  Diabetes:              >6.4%  Glycemic control for   <7.0% adults with diabetes           Passed - Valid encounter within last 6 months    Recent Outpatient Visits           Yesterday Urinary frequency   [facility] at [facility], Ines Bloomer, MD   2 months ago Hospital discharge follow-up   Primary Care at [place], [place], MD   4 months ago Hypertension associated with diabetes [facility])   Primary Care at [facility], [place], MD   9 months ago Hypertension associated with diabetes [facility])   Primary Care at [facility], Ines Bloomer, MD   1 year ago Hypertension associated with diabetes [facility])   Primary Care at [facility], Ines Bloomer, MD       Future Appointments             In 1 month Sagardia, Ines Bloomer, MD Primary Care at [place], [facility]

## 2021-01-19 NOTE — Telephone Encounter (Signed)
Can you please see of a PA is needed?

## 2021-01-20 LAB — URINE CULTURE

## 2021-01-23 ENCOUNTER — Telehealth: Payer: Self-pay | Admitting: *Deleted

## 2021-01-23 NOTE — Telephone Encounter (Signed)
KEY Brandi Gomez  [id number]

## 2021-01-26 ENCOUNTER — Ambulatory Visit: Payer: Medicare HMO | Admitting: Dietician

## 2021-01-31 ENCOUNTER — Telehealth: Payer: Self-pay | Admitting: *Deleted

## 2021-01-31 NOTE — Telephone Encounter (Signed)
trulicity approved [id number]

## 2021-02-06 DIAGNOSIS — H906 Mixed conductive and sensorineural hearing loss, bilateral: Secondary | ICD-10-CM | POA: Diagnosis not present

## 2021-02-06 DIAGNOSIS — H72813 Multiple perforations of tympanic membrane, bilateral: Secondary | ICD-10-CM | POA: Diagnosis not present

## 2021-02-06 DIAGNOSIS — H90A32 Mixed conductive and sensorineural hearing loss, unilateral, left ear with restricted hearing on the contralateral side: Secondary | ICD-10-CM | POA: Diagnosis not present

## 2021-02-06 DIAGNOSIS — H6983 Other specified disorders of Eustachian tube, bilateral: Secondary | ICD-10-CM | POA: Diagnosis not present

## 2021-02-19 ENCOUNTER — Ambulatory Visit: Payer: Medicare HMO | Admitting: Emergency Medicine

## 2021-02-20 ENCOUNTER — Other Ambulatory Visit: Payer: Self-pay

## 2021-02-20 NOTE — Patient Outreach (Signed)
[facility]) Care Management  02/20/2021  JAMAYIA CROKER 09-22-1971 [id number]   Telephone call to patient for disease management follow up.   No answer.  HIPAA compliant voice message left.    Plan: If no return call, RN CM will attempt patient again in the month of May.  [name], RN, MSN [place] [facility] Care Management Coordinator Direct Line [phone] Cell [phone] Toll Free: [phone]  Fax: [phone]

## 2021-02-21 ENCOUNTER — Ambulatory Visit: Payer: Self-pay

## 2021-02-22 DIAGNOSIS — G4733 Obstructive sleep apnea (adult) (pediatric): Secondary | ICD-10-CM | POA: Diagnosis not present

## 2021-03-05 ENCOUNTER — Encounter: Payer: Medicare HMO | Admitting: Dietician

## 2021-04-10 ENCOUNTER — Other Ambulatory Visit: Payer: Self-pay

## 2021-04-10 ENCOUNTER — Telehealth (INDEPENDENT_AMBULATORY_CARE_PROVIDER_SITE_OTHER): Payer: Medicare HMO | Admitting: Psychiatry

## 2021-04-10 DIAGNOSIS — F22 Delusional disorders: Secondary | ICD-10-CM

## 2021-04-10 MED ORDER — ARIPIPRAZOLE 2 MG PO TABS
ORAL_TABLET | ORAL | 5 refills | Status: DC
Start: 2021-04-10 — End: 2021-12-19

## 2021-04-10 NOTE — Progress Notes (Signed)
Psychiatric Initial Adult Assessment   Patient Identification: Brandi Gomez MRN:  [id number] Date of Evaluation:  04/10/2021 Referral Source: Dr. Tory Emerald Chief Complaint:  Suspiciousness/paranoia Visit Diagnosis: Mood disorder secondary to CVA  Today we spoke to the patient and [name] her boyfriend.  The patient is doing well.  She is stable.  Her biggest problems are physical.  The patient is obese and is continuing to gain weight.  She is diabetic on oral agents.  She does not even know how much she has gained.  Importantly her weight is so much that it is affecting her knees.  She is not able to walk or get out of the house.  She does not seem to have any delusional material at this time.  She denies auditory or visual hallucinations.  Her appetite reports is good.  She is sleeping okay.  Her energy level is fair.  [name] says she is at her baseline.  She does not smoke cigarettes.  She is not suicidal.  She does not appear to be agitated at all.  She has a history of having CVAs and diabetes.  I think her biggest issues now are physical issues.  She has no chest pain or shortness of breath at this time.  She sees her primary care physician Dr. Eda Keys.  I recommended that she read back towards them to try to figure out why she gained so much weight.  It is obvious that she needs a dietary consultation.     (Hypo) Manic Symptoms:   Anxiety Symptoms:   Psychotic Symptoms:   PTSD Symptoms:   Past Psychiatric History: Cymbalta 30 mg Previous Psychotropic Medications: Cymbalta 30 mg  Substance Abuse History in the last 12 months:  No.  Consequences of Substance Abuse:   Past Medical History:  Past Medical History:  Diagnosis Date  . Anemia   . Anxiety   . Bipolar 1 disorder ([place])   . Common migraine 05/19/2015  . Depression   . Diabetes mellitus, type II ([place])   . Hypertension   . Irritable bowel syndrome (IBS)   . Mild mental retardation   . Obesity   . Partial complex  seizure disorder with intractable epilepsy ([place]) 05/12/2014  . Seizures ([place])    intractable, sz 08/23/17  . Sleep apnea   . Stroke ([place])   . Type II or unspecified type diabetes mellitus without mention of complication, not stated as uncontrolled     Past Surgical History:  Procedure Laterality Date  . BUBBLE STUDY  11/15/2019   Procedure: BUBBLE STUDY;  Surgeon: Dorothy Spark, MD;  Location: [place];  Service: Cardiovascular;;  . COLONOSCOPY     2012-normal , Dr Sharlett Iles  . ESOPHAGOGASTRODUODENOSCOPY     normal-Dr Patterson 2012  . LOOP RECORDER INSERTION N/A 05/30/2017   Procedure: Loop Recorder Insertion;  Surgeon: Constance Haw, MD;  Location: [place] CV LAB;  Service: Cardiovascular;  Laterality: N/A;  . LOOP RECORDER REMOVAL N/A 03/04/2018   Procedure: LOOP RECORDER REMOVAL;  Surgeon: Constance Haw, MD;  Location: [place] CV LAB;  Service: Cardiovascular;  Laterality: N/A;  . MYRINGOTOMY WITH TUBE PLACEMENT    . MYRINGOTOMY WITH TUBE PLACEMENT Right 11/05/2017   Procedure: MYRINGOTOMY WITH TUBE PLACEMENT;  Surgeon: Melissa Montane, MD;  Location: [place];  Service: ENT;  Laterality: Right;  right T Tube placement  . NASAL SINUS SURGERY    . TEE WITHOUT CARDIOVERSION N/A 05/30/2017   Procedure: TRANSESOPHAGEAL ECHOCARDIOGRAM (TEE);  Surgeon: Acie Fredrickson Wonda Cheng, MD;  Location: [place];  Service: Cardiovascular;  Laterality: N/A;  . TEE WITHOUT CARDIOVERSION N/A 11/15/2019   Procedure: TRANSESOPHAGEAL ECHOCARDIOGRAM (TEE);  Surgeon: Dorothy Spark, MD;  Location: [facility] ENDOSCOPY;  Service: Cardiovascular;  Laterality: N/A;    Family Psychiatric History:   Family History:  Family History  Problem Relation Age of Onset  . Diabetes Mother        passed away from accidental death  . Hypertension Mother   . Bipolar disorder Father   . Diabetes Daughter   . Leukemia Daughter   . Ovarian cancer Maternal Aunt     Social History:   Social History    Socioeconomic History  . Marital status: Single    Spouse name: [name]  . Number of children: 3  . Years of education: 46  . Highest education level: Not on file  Occupational History  . Occupation: disabled    Employer: DISABLED  Tobacco Use  . Smoking status: Never Smoker  . Smokeless tobacco: Never Used  Vaping Use  . Vaping Use: Never used  Substance and Sexual Activity  . Alcohol use: No  . Drug use: No  . Sexual activity: Never  Other Topics Concern  . Not on file  Social History Narrative   Patient lives at home with daughter.    Patient has 3 children.    Patient is right handed.    Patient has a high school education.    Patient is on disability   Patient drinks 2 cups of caffeine daily.   Social Determinants of Health   Financial Resource Strain: Not on file  Food Insecurity: No Food Insecurity  . Worried About Charity fundraiser in the Last Year: Never true  . Ran Out of Food in the Last Year: Never true  Transportation Needs: No Transportation Needs  . Lack of Transportation (Medical): No  . Lack of Transportation (Non-Medical): No  Physical Activity: Not on file  Stress: Not on file  Social Connections: Not on file    Additional Social History:   Allergies:   Allergies  Allergen Reactions  . Amoxicillin Itching    Did it involve swelling of the face/tongue/throat, SOB, or low BP? No Did it involve sudden or severe rash/hives, skin peeling, or any reaction on the inside of your mouth or nose? No Did you need to seek medical attention at a hospital or doctor's office? No When did it last happen?within the past 10 years If all above answers are "NO", may proceed with cephalosporin use.   Marland Kitchen Lisinopril Swelling    Angioedema   . Hydrocodone Other (See Comments)    Depressed   . Tegretol [Carbamazepine] Swelling    Throat swells    Metabolic Disorder Labs: Lab Results  Component Value Date   HGBA1C 7.9 (A) 01/18/2021   MPG 180.03  10/31/2020   MPG 139.85 10/12/2019   No results found for: PROLACTIN Lab Results  Component Value Date   CHOL 108 04/18/2020   TRIG 100 10/31/2020   HDL 52 04/18/2020   CHOLHDL 2.1 04/18/2020   VLDL 10 10/12/2019   LDLCALC 44 04/18/2020   LDLCALC 64 10/12/2019     Current Medications: Current Outpatient Medications  Medication Sig Dispense Refill  . acetaminophen (TYLENOL) 325 MG tablet Take 2 tablets (650 mg total) by mouth every 4 (four) hours as needed for mild pain (or temp > 37.5 C (99.5 F)).    Marland Kitchen ARIPiprazole (ABILIFY) 2  MG tablet 1 qam 30 tablet 5  . atorvastatin (LIPITOR) 40 MG tablet TAKE 1 TABLET (40 MG TOTAL) BY MOUTH DAILY AT 6 PM FOR CHOLESTEROL 90 tablet 0  . carvedilol (COREG) 3.125 MG tablet Take 3.125 mg by mouth 2 (two) times daily.    . Dulaglutide (TRULICITY) 1.5 ZD/6.3OV SOPN Inject 1.5 mg into the skin once a week. 6 mL 5  . glipiZIDE (GLUCOTROL) 5 MG tablet Take 1 tablet (5 mg total) by mouth 2 (two) times daily with a meal. 180 tablet 1  . guaiFENesin-dextromethorphan (ROBITUSSIN DM) 100-10 MG/5ML syrup Take 10 mLs by mouth every 4 (four) hours as needed for cough. 118 mL 0  . hydrALAZINE (APRESOLINE) 10 MG tablet Take 1 tablet (10 mg total) by mouth 2 (two) times daily. 180 tablet 3  . ipratropium (ATROVENT) 0.06 % nasal spray Place 2 sprays into both nostrils 3 (three) times daily as needed for rhinitis. (Patient not taking: Reported on 01/18/2021) 15 mL 12  . isosorbide dinitrate (ISORDIL) 10 MG tablet Take 1 tablet (10 mg total) by mouth 2 (two) times daily. 180 tablet 3  . lamoTRIgine (LAMICTAL) 200 MG tablet Take 1 tablet (200 mg total) by mouth 2 (two) times daily. 60 tablet 5  . torsemide (DEMADEX) 10 MG tablet Take 10 mg by mouth daily. (Patient not taking: Reported on 01/18/2021)     No current facility-administered medications for this visit.    Neurologic: Headache: No Seizure: Yes Paresthesias:No  Musculoskeletal: Strength & Muscle Tone:  within normal limits Gait & Station: normal Patient leans: Backward and N/A  Psychiatric Specialty Exam: ROS  There were no vitals taken for this visit.There is no height or weight on file to calculate BMI.  General Appearance: Casual  Eye Contact:  Fair  Speech:  Slurred  Volume:  Normal  Mood:  Dysphoric  Affect:  Appropriate  Thought Process:  Goal Directed  Orientation:  Full (Time, Place, and Person)  Thought Content:  Logical  Suicidal Thoughts:  No  Homicidal Thoughts:  No  Memory:  Negative  Judgement:  Fair  Insight:  Lacking  Psychomotor Activity:  Decreased  Concentration:    Recall:  [place] of Knowledge:Fair  Language: Fair  Akathisia:  No  Handed:    AIMS (if indicated):    Assets:  Desire for Improvement  ADL's:  Intact  Cognition: WNL  Sleep:      Treatment Plan Summary:  This patient's diagnosis is delusional disorder.  She has a psychotic condition that right now is not always evident.  She apparently was so uncomfortable in her setting that she would attempt to leave as if she was frightened of things.  Her diagnosis is not all that clear.  She never really had other forms of psychosis since I have known her.  At this time we will go ahead and reduce her Abilify from a dose of 5 mg down to 2 mg.  I will insist that she returns in person in 3 months.  According to [name] she is stable.  He is a good provider for her.  The patient is functioning reasonably well with a lot of assistance.

## 2021-04-18 ENCOUNTER — Other Ambulatory Visit: Payer: Self-pay

## 2021-04-18 NOTE — Patient Outreach (Signed)
[facility]) Care Management  04/18/2021  KINZLEE SELVY 1971/05/04 [id number]   Telephone call to patient for disease management follow up.   No answer.  HIPAA compliant voice message left.    Plan: If no return call, RN CM will attempt patient again in August.  Landen Breeland J Brodie Scovell, RN, MSN [place] Management Care Management Coordinator Direct Line [phone] Cell [phone] Toll Free: [phone]  Fax: [phone]

## 2021-04-19 ENCOUNTER — Ambulatory Visit: Payer: Self-pay

## 2021-05-10 ENCOUNTER — Other Ambulatory Visit: Payer: Self-pay | Admitting: Emergency Medicine

## 2021-05-31 ENCOUNTER — Telehealth: Payer: Self-pay | Admitting: Family Medicine

## 2021-05-31 DIAGNOSIS — G4733 Obstructive sleep apnea (adult) (pediatric): Secondary | ICD-10-CM

## 2021-05-31 NOTE — Telephone Encounter (Signed)
Pt called, DME company said to get a new machine would need a prescription. Would like a call from the nurse.

## 2021-05-31 NOTE — Telephone Encounter (Signed)
Spoke to Pt, she states she never received a new cpap machine. She has continued to use her old cpap. Can a new cpap order be placed.

## 2021-05-31 NOTE — Telephone Encounter (Signed)
CM sent to [facility].

## 2021-05-31 NOTE — Telephone Encounter (Signed)
Spoke to patient , pt is needing a new cpap machine due to it leaking and being 52 years old. She has called [facility] and they have asked [place] to put in an order for new cpap machine.

## 2021-06-18 ENCOUNTER — Other Ambulatory Visit: Payer: Self-pay | Admitting: Neurology

## 2021-06-21 ENCOUNTER — Other Ambulatory Visit: Payer: Self-pay | Admitting: Neurology

## 2021-06-21 MED ORDER — LAMOTRIGINE 25 MG PO TABS: 50.0000 mg | ORAL_TABLET | Freq: Every evening | ORAL | 0 refills | Status: DC

## 2021-06-21 NOTE — Telephone Encounter (Signed)
Pt called in stating she had a seizure last night, she is wanting a sooner appt. Pt requesting a call back. Please advise.

## 2021-06-21 NOTE — Telephone Encounter (Signed)
I returned the call to the patient. She is currently on lamotrigine 200mg , one tablet BID. Denies any missed doses. Last evening, her daughter and boyfriend witnessed a starting spell, along with bilateral lower extremity twitching. It was estimated lasting 5-8 minutes. EMS was called to the home. Reports she was back to baseline when they arrived. She declined transportation to the hospital.

## 2021-06-21 NOTE — Telephone Encounter (Signed)
Per vo by Judson Roch, continue lamotrigine 200mg , one tablet twice daily. She will add lamotrigine 25mg , two tablets to her bedtime dosage. Total daily dose of 200mg  in am and 250mg  in pm. I spoke to the patient, daughter and boyfriend who all verbalized understanding of this plan. Repeated instructions back to me correctly. She will keep her pending appt on 07/05/21.

## 2021-07-05 ENCOUNTER — Ambulatory Visit: Payer: Medicare HMO | Admitting: Neurology

## 2021-07-13 ENCOUNTER — Other Ambulatory Visit: Payer: Self-pay | Admitting: Neurology

## 2021-07-17 ENCOUNTER — Other Ambulatory Visit: Payer: Self-pay

## 2021-07-17 NOTE — Patient Outreach (Signed)
[facility]) Care Management  07/17/2021  Brandi Gomez 01/05/1971 [id number]   Telephone call to patient for disease management follow up.   No answer.  HIPAA compliant voice message left.    Plan: If no return call, RN CM will attempt patient again in the month of November.    [name], RN, MSN [place] Management Care Management Coordinator Direct Line [phone] Cell [phone] Toll Free: [phone]  Fax: [phone]

## 2021-07-19 ENCOUNTER — Ambulatory Visit: Payer: Self-pay

## 2021-07-30 ENCOUNTER — Encounter (HOSPITAL_COMMUNITY): Payer: Self-pay

## 2021-07-30 ENCOUNTER — Other Ambulatory Visit: Payer: Self-pay

## 2021-07-30 ENCOUNTER — Emergency Department (HOSPITAL_COMMUNITY)
Admission: EM | Admit: 2021-07-30 | Discharge: 2021-07-31 | Disposition: A | Discharge status: Home / Self Care | Payer: Medicare HMO | Attending: Emergency Medicine | Admitting: Emergency Medicine

## 2021-07-30 ENCOUNTER — Telehealth: Payer: Self-pay | Admitting: Neurology

## 2021-07-30 DIAGNOSIS — N183 Chronic kidney disease, stage 3 unspecified: Secondary | ICD-10-CM | POA: Insufficient documentation

## 2021-07-30 DIAGNOSIS — Z7984 Long term (current) use of oral hypoglycemic drugs: Secondary | ICD-10-CM | POA: Insufficient documentation

## 2021-07-30 DIAGNOSIS — Y900 Blood alcohol level of less than 20 mg/100 ml: Secondary | ICD-10-CM | POA: Diagnosis not present

## 2021-07-30 DIAGNOSIS — E1122 Type 2 diabetes mellitus with diabetic chronic kidney disease: Secondary | ICD-10-CM | POA: Diagnosis not present

## 2021-07-30 DIAGNOSIS — I4581 Long QT syndrome: Secondary | ICD-10-CM | POA: Insufficient documentation

## 2021-07-30 DIAGNOSIS — I5042 Chronic combined systolic (congestive) and diastolic (congestive) heart failure: Secondary | ICD-10-CM | POA: Diagnosis not present

## 2021-07-30 DIAGNOSIS — R569 Unspecified convulsions: Secondary | ICD-10-CM

## 2021-07-30 DIAGNOSIS — I130 Hypertensive heart and chronic kidney disease with heart failure and stage 1 through stage 4 chronic kidney disease, or unspecified chronic kidney disease: Secondary | ICD-10-CM | POA: Diagnosis not present

## 2021-07-30 DIAGNOSIS — Z794 Long term (current) use of insulin: Secondary | ICD-10-CM | POA: Insufficient documentation

## 2021-07-30 DIAGNOSIS — R9431 Abnormal electrocardiogram [ECG] [EKG]: Secondary | ICD-10-CM

## 2021-07-30 DIAGNOSIS — G40909 Epilepsy, unspecified, not intractable, without status epilepticus: Secondary | ICD-10-CM | POA: Insufficient documentation

## 2021-07-30 DIAGNOSIS — Z8616 Personal history of COVID-19: Secondary | ICD-10-CM | POA: Insufficient documentation

## 2021-07-30 DIAGNOSIS — Z79899 Other long term (current) drug therapy: Secondary | ICD-10-CM | POA: Insufficient documentation

## 2021-07-30 LAB — BASIC METABOLIC PANEL
Anion gap: 7 (ref 5–15)
BUN: 18 mg/dL (ref 6–20)
CO2: 23 mmol/L (ref 22–32)
Calcium: 8.2 mg/dL — ABNORMAL LOW (ref 8.9–10.3)
Chloride: 103 mmol/L (ref 98–111)
Creatinine, Ser: 1.31 mg/dL — ABNORMAL HIGH (ref 0.44–1.00)
GFR, Estimated: 49 mL/min — ABNORMAL LOW (ref >60)
Glucose, Bld: 233 mg/dL — ABNORMAL HIGH (ref 70–99)
Potassium: 5.8 mmol/L — ABNORMAL HIGH (ref 3.5–5.1)
Sodium: 133 mmol/L — ABNORMAL LOW (ref 135–145)

## 2021-07-30 LAB — RAPID URINE DRUG SCREEN, HOSP PERFORMED
Amphetamines: NOT DETECTED
Barbiturates: NOT DETECTED
Benzodiazepines: NOT DETECTED
Cocaine: NOT DETECTED
Opiates: NOT DETECTED
Tetrahydrocannabinol: NOT DETECTED

## 2021-07-30 LAB — HCG, QUANTITATIVE, PREGNANCY: hCG, Beta Chain, Quant, S: <1 m[IU]/mL (ref <5)

## 2021-07-30 LAB — MAGNESIUM: Magnesium: 2.2 mg/dL (ref 1.7–2.4)

## 2021-07-30 LAB — ETHANOL: Alcohol, Ethyl (B): <10 mg/dL (ref <10)

## 2021-07-30 LAB — CBG MONITORING, ED: Glucose-Capillary: 227 mg/dL — ABNORMAL HIGH (ref 70–99)

## 2021-07-30 MED ORDER — LEVETIRACETAM IN NACL 1000 MG/100ML IV SOLN
1000.0000 mg | Freq: Once | INTRAVENOUS | Status: AC
  Administered 2021-07-30: 1000 mg via INTRAVENOUS
  Filled 2021-07-30: qty 100

## 2021-07-30 NOTE — Telephone Encounter (Signed)
I called the pt and we moved her appt to 07/31/21 at 200 pm check in at 130.

## 2021-07-30 NOTE — ED Triage Notes (Signed)
Patient BIB [facility] for post seizure. Family stated the seizure was unwitnessed and episode of incontinence. EMS reports on their arrival patient was A&O x 4, patient's baseline. Patient reports 1x a month. She is compliant with her medication

## 2021-07-30 NOTE — Telephone Encounter (Signed)
Pt called she is scheduled to come into the office on 08/02/2021 with Dr. Jannifer Franklin. However she is needing to reschedule this appt with Jannifer Franklin informed her that he is retiring soon but she wants to see him before he goes out. Pt requesting a call back.

## 2021-07-30 NOTE — ED Provider Notes (Signed)
[facility] DEPT Provider Note   CSN: [id number] Arrival date & time: 07/30/21  1804     History Chief Complaint  Patient presents with   Seizures    Brandi Gomez is a 50 y.o. female with from past medical history of seizures on Lamictal, anemia, bipolar 1 disorder, diabetes that presents emerged department today for seizure.  Patient was brought in by [facility].  Patient is alert and oriented on my exam, states that she has not had a seizure since last month.  Has been compliant with her medication.  Denies any alcohol or drug use.  Patient states that she was supposed to have a neurologist appointment today, however call this off because she was feeling well.  I was able to speak to her friend who was at home when this occurred, he states that he actually did not witness a seizure however witnessed the aftermath of this.  States that his daughter called him into the room stating that her patient was confused and laying in her bed. Friend had noticed that patient had urinated herself, when EMS came patient was back to baseline.  Friend states that she most likely had seizure while laying in the bed.  Patient states that she felt as if she had a seizure as well, states that this that she normally feels after seizures.  Denies any head injury.  Patient did not fall.  Denies any nausea, vomiting, abdominal pain, back pain, CP, Sob, arthralgias, headache, dizziness, vision changes.  Denies any head trauma.  Denies any new medications.  Patient is on multiple psychiatric medications.  HPI     Past Medical History:  Diagnosis Date   Anemia    Anxiety    Bipolar 1 disorder ([place])    Common migraine 05/19/2015   Depression    Diabetes mellitus, type II ([place])    Hypertension    Irritable bowel syndrome (IBS)    Mild mental retardation    Obesity    Partial complex seizure disorder with intractable epilepsy ([place]) 05/12/2014   Seizures ([place])    intractable,  sz 08/23/17   Sleep apnea    Stroke ([place])    Type II or unspecified type diabetes mellitus without mention of complication, not stated as uncontrolled     Patient Active Problem List   Diagnosis Date Noted   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, unspecified cerebrovascular disease type ([place]) 01/18/2021   COVID-19 10/31/2020   Morbid (severe) obesity due to excess calories ([place]) 04/18/2020   HLD (hyperlipidemia) 10/12/2019   CKD (chronic kidney disease) stage 3, GFR 30-59 ml/min (HCC) 10/12/2019   Chronic combined systolic and diastolic heart failure ([place])    Depression 07/14/2019   Bipolar disorder ([place]) 05/13/2019   Bipolar 1 disorder ([place]) 05/13/2019   Adjustment disorder with anxiety    History of recent stroke 08/06/2017   OSA (obstructive sleep apnea)    Controlled type 2 diabetes mellitus without complication, without long-term current use of insulin ([place]) 05/08/2017   Hypertension associated with diabetes ([place]) 07/27/2008   Seizure disorder ([place]) 02/05/2007    Past Surgical History:  Procedure Laterality Date   BUBBLE STUDY  11/15/2019   Procedure: BUBBLE STUDY;  Surgeon: Dorothy Spark, MD;  Location: [facility];  Service: Cardiovascular;;   COLONOSCOPY     2012-normal , Dr Sharlett Iles   ESOPHAGOGASTRODUODENOSCOPY     normal-Dr Patterson 2012   [place] N/A 05/30/2017  Procedure: Loop Recorder Insertion;  Surgeon: Constance Haw, MD;  Location: [facility];  Service: Cardiovascular;  Laterality: N/A;   LOOP RECORDER REMOVAL N/A 03/04/2018   Procedure: LOOP RECORDER REMOVAL;  Surgeon: Constance Haw, MD;  Location: [facility];  Service: Cardiovascular;  Laterality: N/A;   MYRINGOTOMY WITH TUBE PLACEMENT     MYRINGOTOMY WITH TUBE PLACEMENT Right 11/05/2017   Procedure: MYRINGOTOMY WITH TUBE PLACEMENT;  Surgeon: Melissa Montane, MD;  Location: [facility];  Service: ENT;   Laterality: Right;  right T Tube placement   NASAL SINUS SURGERY     TEE WITHOUT CARDIOVERSION N/A 05/30/2017   Procedure: TRANSESOPHAGEAL ECHOCARDIOGRAM (TEE);  Surgeon: Acie Fredrickson Wonda Cheng, MD;  Location: [facility];  Service: Cardiovascular;  Laterality: N/A;   TEE WITHOUT CARDIOVERSION N/A 11/15/2019   Procedure: TRANSESOPHAGEAL ECHOCARDIOGRAM (TEE);  Surgeon: Dorothy Spark, MD;  Location: [facility]COPY;  Service: Cardiovascular;  Laterality: N/A;     OB History   No obstetric history on file.     Family History  Problem Relation Age of Onset   Diabetes Mother        passed away from accidental death   Hypertension Mother    Bipolar disorder Father    Diabetes Daughter    Leukemia Daughter    Ovarian cancer Maternal Aunt     Social History   Tobacco Use   Smoking status: Never   Smokeless tobacco: Never  Vaping Use   Vaping Use: Never used  Substance Use Topics   Alcohol use: No   Drug use: No    Home Medications Prior to Admission medications   Medication Sig Start Date End Date Taking? Authorizing Provider  acetaminophen (TYLENOL) 325 MG tablet Take 2 tablets (650 mg total) by mouth every 4 (four) hours as needed for mild pain (or temp > 37.5 C (99.5 F)). 10/19/19   Angiulli, Lavon Paganini, PA-C  ARIPiprazole (ABILIFY) 2 MG tablet 1 qam 04/10/21   Plovsky, Berneta Sages, MD  ARIPiprazole (ABILIFY) 5 MG tablet Take 5 mg by mouth every morning. 07/04/21   [provider]  atorvastatin (LIPITOR) 40 MG tablet TAKE 1 TABLET (40 MG TOTAL) BY MOUTH DAILY AT 6 PM FOR CHOLESTEROL 05/10/21   Horald Pollen, MD  carvedilol (COREG) 3.125 MG tablet Take 3.125 mg by mouth 2 (two) times daily. 09/23/20   [provider]  carvedilol (COREG) 6.25 MG tablet Take 6.25 mg by mouth 2 (two) times daily. 05/20/21   [provider]  Dulaglutide (TRULICITY) 1.5 0000000 SOPN Inject 1.5 mg into the skin once a week. 01/18/21   Horald Pollen, MD  glipiZIDE (GLUCOTROL)  5 MG tablet Take 1 tablet (5 mg total) by mouth 2 (two) times daily with a meal. 08/29/20 11/27/20  Sagardia, Ines Bloomer, MD  guaiFENesin-dextromethorphan (ROBITUSSIN DM) 100-10 MG/5ML syrup Take 10 mLs by mouth every 4 (four) hours as needed for cough. 11/04/20   Caren Griffins, MD  hydrALAZINE (APRESOLINE) 10 MG tablet Take 1 tablet (10 mg total) by mouth 2 (two) times daily. 01/04/20 10/31/20  Horald Pollen, MD  ipratropium (ATROVENT) 0.06 % nasal spray Place 2 sprays into both nostrils 3 (three) times daily as needed for rhinitis. Patient not taking: Reported on 01/18/2021 10/27/20   Augusto Gamble B, NP  isosorbide dinitrate (ISORDIL) 10 MG tablet Take 1 tablet (10 mg total) by mouth 2 (two) times daily. 01/04/20 10/31/20  Horald Pollen, MD  JANUVIA 50 MG tablet  Take 50 mg by mouth daily. 06/18/21   [provider]  lamoTRIgine (LAMICTAL) 200 MG tablet TAKE 1 TABLET BY MOUTH TWICE A DAY 06/19/21   [name], NP  lamoTRIgine (LAMICTAL) 25 MG tablet TAKE 2 TABLETS (50 MG TOTAL) BY MOUTH AT BEDTIME. TOTAL DAILY DOSE OF LAMOTRIGINE '200MG'$  IN AM AND '250MG'$  IN PM. 07/16/21   [name], MD  torsemide (DEMADEX) 10 MG tablet Take 10 mg by mouth daily. Patient not taking: Reported on 01/18/2021    [provider]  methylphenidate (RITALIN) 5 MG tablet Take 1 tablet (5 mg total) by mouth 2 (two) times daily. Patient not taking: Reported on 12/29/2019 10/29/19 01/01/20  Izora Ribas, MD  traZODone (DESYREL) 100 MG tablet Take 1 tablet (100 mg total) by mouth at bedtime. Patient not taking: Reported on 12/29/2019 10/19/19 01/01/20  Angiulli, Lavon Paganini, PA-C    Allergies    Amoxicillin, Lisinopril, Hydrocodone, and Tegretol [carbamazepine]  Review of Systems   Review of Systems  Constitutional:  Negative for chills, diaphoresis, fatigue and fever.  HENT:  Negative for congestion, sore throat and trouble swallowing.   Eyes:  Negative for pain and visual  disturbance.  Respiratory:  Negative for cough, shortness of breath and wheezing.   Cardiovascular:  Negative for chest pain, palpitations and leg swelling.  Gastrointestinal:  Negative for abdominal distention, abdominal pain, diarrhea, nausea and vomiting.  Genitourinary:  Negative for difficulty urinating.  Musculoskeletal:  Negative for back pain, neck pain and neck stiffness.  Skin:  Negative for pallor.  Neurological:  Positive for seizures. Negative for dizziness, speech difficulty, weakness and headaches.  Psychiatric/Behavioral:  Negative for confusion.    Physical Exam Updated Vital Signs BP 121/71   Pulse 67   Temp 98.6 F (37 C)   Resp (!) 29   Ht '5\' 1"'$  (1.549 m)   Wt 124.7 kg   LMP 06/22/2021   SpO2 100%   BMI 51.96 kg/m   Physical Exam Constitutional:      General: She is not in acute distress.    Appearance: Normal appearance. She is not ill-appearing, toxic-appearing or diaphoretic.  HENT:     Mouth/Throat:     Mouth: Mucous membranes are moist.     Pharynx: Oropharynx is clear.  Eyes:     General: No scleral icterus.    Extraocular Movements: Extraocular movements intact.     Pupils: Pupils are equal, round, and reactive to light.  Cardiovascular:     Rate and Rhythm: Normal rate and regular rhythm.     Pulses: Normal pulses.     Heart sounds: Normal heart sounds.  Pulmonary:     Effort: Pulmonary effort is normal. No respiratory distress.     Breath sounds: Normal breath sounds. No stridor. No wheezing, rhonchi or rales.  Chest:     Chest wall: No tenderness.  Abdominal:     General: Abdomen is flat. There is no distension.     Palpations: Abdomen is soft.     Tenderness: There is no abdominal tenderness. There is no guarding or rebound.  Musculoskeletal:        General: No swelling or tenderness. Normal range of motion.     Cervical back: Normal range of motion and neck supple. No rigidity.     Right lower leg: No edema.     Left lower leg: No  edema.  Skin:    General: Skin is warm and dry.     Capillary Refill:  Capillary refill takes less than 2 seconds.     Coloration: Skin is not pale.  Neurological:     General: No focal deficit present.     Mental Status: She is alert and oriented to person, place, and time.     Comments: Alert to self, place, situation. Unsure about the president, however knows Trump was previous president. Clear speech. No facial droop. CNIII-XII grossly intact. Bilateral upper and lower extremities' sensation grossly intact. 5/5 symmetric strength with grip strength and with plantar and dorsi flexion bilaterally. Patellar DTRs are 2+ and symmetric . Normal finger to nose bilaterally. Negative pronator drift.    Psychiatric:        Mood and Affect: Mood normal.        Behavior: Behavior normal.    ED Results / Procedures / Treatments   Labs (all labs ordered are listed, but only abnormal results are displayed) Labs Reviewed  BASIC METABOLIC PANEL - Abnormal; Notable for the following components:      Result Value   Sodium 133 (*)    Potassium 5.8 (*)    Glucose, Bld 233 (*)    Creatinine, Ser 1.31 (*)    Calcium 8.2 (*)    GFR, Estimated 49 (*)    All other components within normal limits  CBG MONITORING, ED - Abnormal; Notable for the following components:   Glucose-Capillary 227 (*)    All other components within normal limits  MAGNESIUM  ETHANOL  RAPID URINE DRUG SCREEN, HOSP PERFORMED  HCG, QUANTITATIVE, PREGNANCY  CBC WITH DIFFERENTIAL/PLATELET  LAMOTRIGINE LEVEL  CBC WITH DIFFERENTIAL/PLATELET  BASIC METABOLIC PANEL  CBC WITH DIFFERENTIAL/PLATELET  I-STAT BETA HCG BLOOD, ED ([facility], [facility], [facility] ONLY)    EKG None  Radiology No results found.  Procedures Procedures   Medications Ordered in ED Medications  sodium bicarbonate injection 50 mEq (has no administration in time range)  levETIRAcetam (KEPPRA) IVPB 1000 mg/100 mL premix (0 mg Intravenous Stopped 07/30/21 2230)    ED Course   I have reviewed the triage vital signs and the nursing notes.  Pertinent labs & imaging results that were available during my care of the patient were reviewed by me and considered in my medical decision making (see chart for details).    MDM Rules/Calculators/A&P                          Patient presents emerged department today for seizures.  Patient with normal neuro exam, no longer postictal.  Patient has been compliant with seizure medication, did not fall today.  Did not hit her head.  Normal neuro exam, do not think that we need head CT at this time, patient has history of seizure disorder.  Was able to speak to friend who witnessed her postictal and patient did also urinate herself.  Patient states she feels as if she did have a seizure as well.  Will obtain basic seizure work-up and load with Keppra at this time.  BMP with potassium of 5.8, will repeat to make sure this is not hemolyzed.  EKG also obtained which shows prolonged QTC, Keppra had already been given at this time.  EKG without any hyperacute T waves.  Will refrain from other prolonged QT medications.  Bicarb push given.  At this time awaiting CBC and repeat BMP.  Pt care was handed off to K. Humes PA-C at 74.  Complete history and physical and current plan have been communicated.  Please refer  to their note for the remainder of ED care and ultimate disposition.  Awaiting second potassium, patient will need repeat EKG after bicarb push.  Will need to be ambulated. Final Clinical Impression(s) / ED Diagnoses Final diagnoses:  Prolonged Q-T interval on ECG  Seizure [facility])    Rx / DC Orders ED Discharge Orders     None        Alfredia Client, PA-C 07/31/21 0024    Luna Fuse, MD 08/02/21 2350

## 2021-07-31 ENCOUNTER — Encounter: Payer: Self-pay | Admitting: Neurology

## 2021-07-31 ENCOUNTER — Ambulatory Visit (INDEPENDENT_AMBULATORY_CARE_PROVIDER_SITE_OTHER): Payer: Medicare HMO | Admitting: Neurology

## 2021-07-31 ENCOUNTER — Other Ambulatory Visit: Payer: Self-pay

## 2021-07-31 ENCOUNTER — Encounter (HOSPITAL_COMMUNITY): Payer: Self-pay | Admitting: Emergency Medicine

## 2021-07-31 DIAGNOSIS — G40909 Epilepsy, unspecified, not intractable, without status epilepticus: Secondary | ICD-10-CM | POA: Diagnosis not present

## 2021-07-31 LAB — BASIC METABOLIC PANEL
Anion gap: 8 (ref 5–15)
BUN: 17 mg/dL (ref 6–20)
CO2: 25 mmol/L (ref 22–32)
Calcium: 8.4 mg/dL — ABNORMAL LOW (ref 8.9–10.3)
Chloride: 103 mmol/L (ref 98–111)
Creatinine, Ser: 1.19 mg/dL — ABNORMAL HIGH (ref 0.44–1.00)
GFR, Estimated: 55 mL/min — ABNORMAL LOW (ref >60)
Glucose, Bld: 233 mg/dL — ABNORMAL HIGH (ref 70–99)
Potassium: 3.9 mmol/L (ref 3.5–5.1)
Sodium: 136 mmol/L (ref 135–145)

## 2021-07-31 LAB — CBC WITH DIFFERENTIAL/PLATELET
Abs Immature Granulocytes: 0.04 10*3/uL (ref 0.00–0.07)
Basophils Absolute: 0 10*3/uL (ref 0.0–0.1)
Basophils Relative: 0 %
Eosinophils Absolute: 0.2 10*3/uL (ref 0.0–0.5)
Eosinophils Relative: 2 %
HCT: 29.9 % — ABNORMAL LOW (ref 36.0–46.0)
Hemoglobin: 9.1 g/dL — ABNORMAL LOW (ref 12.0–15.0)
Immature Granulocytes: 0 %
Lymphocytes Relative: 25 %
Lymphs Abs: 2.4 10*3/uL (ref 0.7–4.0)
MCH: 26.1 pg (ref 26.0–34.0)
MCHC: 30.4 g/dL (ref 30.0–36.0)
MCV: 85.9 fL (ref 80.0–100.0)
Monocytes Absolute: 0.5 10*3/uL (ref 0.1–1.0)
Monocytes Relative: 6 %
Neutro Abs: 6.5 10*3/uL (ref 1.7–7.7)
Neutrophils Relative %: 67 %
Platelets: 336 10*3/uL (ref 150–400)
RBC: 3.48 MIL/uL — ABNORMAL LOW (ref 3.87–5.11)
RDW: 17 % — ABNORMAL HIGH (ref 11.5–15.5)
WBC: 9.7 10*3/uL (ref 4.0–10.5)
nRBC: 0 % (ref 0.0–0.2)

## 2021-07-31 MED ORDER — ZONISAMIDE 25 MG PO CAPS: ORAL_CAPSULE | ORAL | 3 refills | Status: DC

## 2021-07-31 MED ORDER — SODIUM BICARBONATE 8.4 % IV SOLN
50.0000 meq | Freq: Once | INTRAVENOUS | Status: AC
  Administered 2021-07-31: 50 meq via INTRAVENOUS

## 2021-07-31 NOTE — Progress Notes (Signed)
Reason for visit: Intractable seizures  Brandi Gomez is an 50 y.o. female  History of present illness:  Brandi Gomez is a 50 year old right-handed black female with a history of bipolar disorder, diabetes, sleep apnea on CPAP, and intractable seizures.  The patient has a history of a right posterior frontal stroke in the past.  The patient was seen in the emergency room yesterday with a seizure event.  She comes in today with a family member.  The patient apparently took a nap and had a seizure shortly thereafter, she does wear her CPAP during the naps.  The patient has not missed any of her lamotrigine.  She currently is taking 200 mg in the morning and 250 mg in the evening.  A lamotrigine level was checked through the emergency room but is still pending.  Urine drug screen was negative.  The patient has continued to gain weight over time, she is 5 feet 1 inches tall and weighs 317 pounds.  She comes here for further evaluation.  Past Medical History:  Diagnosis Date   Anemia    Anxiety    Bipolar 1 disorder ([place])    Common migraine 05/19/2015   Depression    Diabetes mellitus, type II ([place])    Hypertension    Irritable bowel syndrome (IBS)    Mild mental retardation    Obesity    Partial complex seizure disorder with intractable epilepsy ([place]) 05/12/2014   Seizures ([place])    intractable, sz 08/23/17   Sleep apnea    Stroke ([place])    Type II or unspecified type diabetes mellitus without mention of complication, not stated as uncontrolled     Past Surgical History:  Procedure Laterality Date   BUBBLE STUDY  11/15/2019   Procedure: BUBBLE STUDY;  Surgeon: Dorothy Spark, MD;  Location: [facility];  Service: Cardiovascular;;   COLONOSCOPY     2012-normal , Dr Sharlett Iles   ESOPHAGOGASTRODUODENOSCOPY     normal-Dr Patterson 2012   [place] N/A 05/30/2017   Procedure: Loop Recorder Insertion;  Surgeon: Constance Haw, MD;  Location: [facility];   Service: Cardiovascular;  Laterality: N/A;   LOOP RECORDER REMOVAL N/A 03/04/2018   Procedure: LOOP RECORDER REMOVAL;  Surgeon: Constance Haw, MD;  Location: [facility];  Service: Cardiovascular;  Laterality: N/A;   MYRINGOTOMY WITH TUBE PLACEMENT     MYRINGOTOMY WITH TUBE PLACEMENT Right 11/05/2017   Procedure: MYRINGOTOMY WITH TUBE PLACEMENT;  Surgeon: Melissa Montane, MD;  Location: [facility];  Service: ENT;  Laterality: Right;  right T Tube placement   NASAL SINUS SURGERY     TEE WITHOUT CARDIOVERSION N/A 05/30/2017   Procedure: TRANSESOPHAGEAL ECHOCARDIOGRAM (TEE);  Surgeon: Acie Fredrickson Wonda Cheng, MD;  Location: [facility];  Service: Cardiovascular;  Laterality: N/A;   TEE WITHOUT CARDIOVERSION N/A 11/15/2019   Procedure: TRANSESOPHAGEAL ECHOCARDIOGRAM (TEE);  Surgeon: Dorothy Spark, MD;  Location: [facility] ENDOSCOPY;  Service: Cardiovascular;  Laterality: N/A;    Family History  Problem Relation Age of Onset   Diabetes Mother        passed away from accidental death   Hypertension Mother    Bipolar disorder Father    Diabetes Daughter    Leukemia Daughter    Ovarian cancer Maternal Aunt     Social history:  reports that she has never smoked. She has never used smokeless tobacco. She reports that she does not drink alcohol and does not use drugs.  Allergies  Allergen Reactions   Amoxicillin Itching    Did it involve swelling of the face/tongue/throat, SOB, or low BP? No Did it involve sudden or severe rash/hives, skin peeling, or any reaction on the inside of your mouth or nose? No Did you need to seek medical attention at a hospital or doctor's office? No When did it last happen?      within the past 10 years If all above answers are "NO", may proceed with cephalosporin use.    Lisinopril Swelling    Angioedema    Hydrocodone Other (See Comments)    Depressed    Tegretol [Carbamazepine] Swelling    Throat swells    Medications:  Prior to Admission medications    Medication Sig Start Date End Date Taking? Authorizing Provider  acetaminophen (TYLENOL) 325 MG tablet Take 2 tablets (650 mg total) by mouth every 4 (four) hours as needed for mild pain (or temp > 37.5 C (99.5 F)). 10/19/19  Yes Angiulli, Lavon Paganini, PA-C  ARIPiprazole (ABILIFY) 2 MG tablet 1 qam 04/10/21  Yes Plovsky, Berneta Sages, MD  ARIPiprazole (ABILIFY) 5 MG tablet Take 5 mg by mouth every morning. 07/04/21  Yes [provider]  atorvastatin (LIPITOR) 40 MG tablet TAKE 1 TABLET (40 MG TOTAL) BY MOUTH DAILY AT 6 PM FOR CHOLESTEROL Patient taking differently: Take 40 mg by mouth daily. 05/10/21  Yes Sagardia, Ines Bloomer, MD  carvedilol (COREG) 6.25 MG tablet Take 6.25 mg by mouth 2 (two) times daily. 05/20/21  Yes [provider]  Dulaglutide (TRULICITY) 1.5 0000000 SOPN Inject 1.5 mg into the skin once a week. 01/18/21  Yes Sagardia, Ines Bloomer, MD  guaiFENesin-dextromethorphan [facility] DM) 100-10 MG/5ML syrup Take 10 mLs by mouth every 4 (four) hours as needed for cough. 11/04/20  Yes Gherghe, Vella Redhead, MD  hydrALAZINE (APRESOLINE) 10 MG tablet Take 1 tablet (10 mg total) by mouth 2 (two) times daily. 01/04/20 07/31/21 Yes Sagardia, Ines Bloomer, MD  isosorbide dinitrate (ISORDIL) 10 MG tablet Take 1 tablet (10 mg total) by mouth 2 (two) times daily. 01/04/20 07/31/21 Yes Sagardia, Ines Bloomer, MD  JANUVIA 50 MG tablet Take 50 mg by mouth daily. 06/18/21  Yes [provider]  lamoTRIgine (LAMICTAL) 200 MG tablet TAKE 1 TABLET BY MOUTH TWICE A DAY 06/19/21  Yes [name], NP  lamoTRIgine (LAMICTAL) 25 MG tablet TAKE 2 TABLETS (50 MG TOTAL) BY MOUTH AT BEDTIME. TOTAL DAILY DOSE OF LAMOTRIGINE '200MG'$  IN AM AND '250MG'$  IN PM. 07/16/21  Yes [name], MD  torsemide (DEMADEX) 10 MG tablet Take 10 mg by mouth daily.   Yes [provider]  methylphenidate (RITALIN) 5 MG tablet Take 1 tablet (5 mg total) by mouth 2 (two) times daily. Patient not taking: Reported on  12/29/2019 10/29/19 01/01/20  Izora Ribas, MD  traZODone (DESYREL) 100 MG tablet Take 1 tablet (100 mg total) by mouth at bedtime. Patient not taking: Reported on 12/29/2019 10/19/19 01/01/20  Angiulli, Lavon Paganini, PA-C    ROS:  Out of a complete 14 system review of symptoms, the patient complains only of the following symptoms, and all other reviewed systems are negative.  Seizure Weight gain Walking difficulty  Height '5\' 1"'$  (1.549 m), weight (!) 317 lb (143.8 kg).  Physical Exam  General: The patient is alert and cooperative at the time of the examination.  The patient is morbidly obese.  Skin: 2+ edema below the knees is seen bilaterally.   Neurologic Exam  Mental status: The patient is alert and oriented x 3 at the time of the examination. The patient has apparent normal recent and remote memory, with an apparently normal attention span and concentration ability.   Cranial nerves: Facial symmetry is present. Speech is normal, no aphasia or dysarthria is noted. Extraocular movements are full. Visual fields are full.  Motor: The patient has good strength in all 4 extremities.  Sensory examination: Soft touch sensation is symmetric on the face, arms, and legs.  Coordination: The patient has good finger-nose-finger and heel-to-shin bilaterally.  Gait and station: The patient has a normal gait for size.  Tandem gait was not attempted.  Romberg is negative.  Reflexes: Deep tendon reflexes are symmetric.   Assessment/Plan:  1.  Intractable seizures  2.  Sleep apnea on CPAP  3.  Morbid obesity  The patient has had 2 seizures since last seen.  The Lamictal level is pending through the emergency room.  I will add Zonegran to this starting at 25 mg twice daily for 2 weeks and then go to 50 mg twice daily.  She will follow-up here in 4 months.  The patient clearly needs to lose weight, we discussed potentially entering into a weight loss program.  It is possible that the  patient may be having some periods of hypoxia during sleep even with CPAP that could lead to a lowered seizure threshold.  In the future, the patient be followed here through Dr. April Manson.  Jill Alexanders MD 07/31/2021 1:55 PM  [facility] [address] [place], [place] [address]  Phone [phone] Fax [phone]

## 2021-07-31 NOTE — ED Provider Notes (Signed)
3:34 AM Care assumed at shift change from [place], [place].  Patient pending repeat labs and recheck of potassium level.  In short, patient with known seizure disorder presenting to the emergency department after a witnessed seizure today.  She has not had any additional seizure activity while in the emergency department.  No clinical decompensation over the last 9 hours.  Was loaded with Keppra, given sodium bicarb for QT prolongation.  Repeat EKG is improved.  Patient appropriate for discharge to follow-up with her neurologist.  Encouraged to return for new or worsening symptoms.  Discharged in stable condition.  Clinical Course as of 07/31/21 [id number]  Tue Jul 31, 2021  0212 Third EKG ordered given most recent with marked QT prolongation. Of note, patient is prescribed a number of QT prolonging agents. [KH]  0213 Repeat potassium reviewed and normal. Creatinine at baseline. [KH]  0307 QTc 440 on repeat EKG [KH]    Clinical Course User Index [KH] Antonietta Breach, PA-C    EKG Interpretation  Date/Time:  Tuesday July 31 2021 02:45:45 EDT Ventricular Rate:  77 PR Interval:  161 QRS Duration: 82 QT Interval:  388 QTC Calculation: 440 R Axis:   97 Text Interpretation: Sinus rhythm Borderline right axis deviation Confirmed by Randal Buba, April ([id number]) on 07/31/2021 2:52:19 AM          Antonietta Breach, PA-C 07/31/21 0335    Palumbo, April, MD 07/31/21 [phone]

## 2021-08-01 LAB — LAMOTRIGINE LEVEL: Lamotrigine Lvl: 6.3 ug/mL (ref 2.0–20.0)

## 2021-08-02 ENCOUNTER — Ambulatory Visit: Payer: Medicare HMO | Admitting: Neurology

## 2021-08-12 ENCOUNTER — Other Ambulatory Visit: Payer: Self-pay | Admitting: Neurology

## 2021-08-14 ENCOUNTER — Other Ambulatory Visit: Payer: Self-pay

## 2021-08-14 ENCOUNTER — Telehealth (HOSPITAL_COMMUNITY): Payer: Medicare HMO | Admitting: Psychiatry

## 2021-08-21 ENCOUNTER — Telehealth: Payer: Self-pay | Admitting: Neurology

## 2021-08-21 NOTE — Telephone Encounter (Signed)
Contacted pt back, advised her that she is suppose to be taking zonisamide (ZONEGRAN) 25 MG capsule One capsule twice a day for 2 weeks, then take 2 capsules twice a day, lamoTRIgine (LAMICTAL) 25 MG tablet TAKE 2 TABLETS (50 MG TOTAL) BY MOUTH AT BEDTIME. TOTAL DAILY DOSE OF LAMOTRIGINE '200MG'$  IN AM AND '250MG'$  IN PM, and lamoTRIgine (LAMICTAL) 200 MG tablet  TAKE 1 TABLET BY MOUTH TWICE A DAY.  She was appreciative for  the clarification.

## 2021-08-21 NOTE — Telephone Encounter (Signed)
Pt called says she lost track of her medications says she cant remember what she's supposed to be taking. Pt requesting a call back.

## 2021-08-27 ENCOUNTER — Ambulatory Visit (INDEPENDENT_AMBULATORY_CARE_PROVIDER_SITE_OTHER): Payer: Medicare HMO

## 2021-08-27 ENCOUNTER — Other Ambulatory Visit: Payer: Self-pay

## 2021-08-27 ENCOUNTER — Ambulatory Visit (HOSPITAL_COMMUNITY)
Admission: EM | Admit: 2021-08-27 | Discharge: 2021-08-27 | Disposition: A | Discharge status: Home / Self Care | Payer: Medicare HMO | Attending: Internal Medicine | Admitting: Internal Medicine

## 2021-08-27 ENCOUNTER — Encounter (HOSPITAL_COMMUNITY): Payer: Self-pay

## 2021-08-27 DIAGNOSIS — J069 Acute upper respiratory infection, unspecified: Secondary | ICD-10-CM | POA: Diagnosis not present

## 2021-08-27 DIAGNOSIS — R059 Cough, unspecified: Secondary | ICD-10-CM | POA: Diagnosis not present

## 2021-08-27 DIAGNOSIS — Z20822 Contact with and (suspected) exposure to covid-19: Secondary | ICD-10-CM | POA: Insufficient documentation

## 2021-08-27 DIAGNOSIS — Z28311 Partially vaccinated for covid-19: Secondary | ICD-10-CM | POA: Insufficient documentation

## 2021-08-27 DIAGNOSIS — R0602 Shortness of breath: Secondary | ICD-10-CM | POA: Diagnosis present

## 2021-08-27 DIAGNOSIS — Z880 Allergy status to penicillin: Secondary | ICD-10-CM | POA: Insufficient documentation

## 2021-08-27 LAB — SARS CORONAVIRUS 2 (TAT 6-24 HRS): SARS Coronavirus 2: NEGATIVE

## 2021-08-27 MED ORDER — PROMETHAZINE-DM 6.25-15 MG/5ML PO SYRP
5.0000 mL | ORAL_SOLUTION | Freq: Four times a day (QID) | ORAL | 0 refills | Status: DC | PRN
Start: 2021-08-27 — End: 2022-02-15

## 2021-08-27 NOTE — ED Triage Notes (Signed)
Pt presents with c/o a cough X 1 week. States she has been around someone that was sneezing. Pt states she does not know of exposure to COVID. States she has only had her 1st dose of the COVID vaccine.

## 2021-08-27 NOTE — ED Provider Notes (Signed)
[facility]    CSN: [id number] Arrival date & time: 08/27/21  [id number]      History   Chief Complaint Chief Complaint  Patient presents with   Cough    HPI Brandi Gomez is a 50 y.o. female comes to urgent care with 1 week history of nonproductive cough and occasional shortness of breath.  Patient says symptoms started insidiously and it has persisted.  She denies any fever or chills.  No runny nose.  No chest pain or chest pressure.  She has occasional wheezing.  No changes in ankle swelling.  No vomiting or diarrhea.  Patient was exposed to another person who had a cough and sneezing.  No abdominal pain or distention.  Patient has received only 1 dose of the COVID-19 vaccine.  She is unsure if it is [employer], museum/gallery or [employer].  She however notes that she is due for her second dose.  First dose was received 3 months ago.   HPI  Past Medical History:  Diagnosis Date   Anemia    Anxiety    Bipolar 1 disorder ([place])    Common migraine 05/19/2015   Depression    Diabetes mellitus, type II ([place])    Hypertension    Irritable bowel syndrome (IBS)    Mild mental retardation    Obesity    Partial complex seizure disorder with intractable epilepsy ([place]) 05/12/2014   Seizures ([place])    intractable, sz 08/23/17   Sleep apnea    Stroke ([place])    Type II or unspecified type diabetes mellitus without mention of complication, not stated as uncontrolled     Patient Active Problem List   Diagnosis Date Noted   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, unspecified cerebrovascular disease type ([place]) 01/18/2021   COVID-19 10/31/2020   Morbid (severe) obesity due to excess calories ([place]) 04/18/2020   HLD (hyperlipidemia) 10/12/2019   CKD (chronic kidney disease) stage 3, GFR 30-59 ml/min (HCC) 10/12/2019   Chronic combined systolic and diastolic heart failure ([place])    Depression 07/14/2019   Bipolar disorder ([place]) 05/13/2019    Bipolar 1 disorder ([place]) 05/13/2019   Adjustment disorder with anxiety    History of recent stroke 08/06/2017   OSA (obstructive sleep apnea)    Controlled type 2 diabetes mellitus without complication, without long-term current use of insulin ([place]) 05/08/2017   Hypertension associated with diabetes ([place]) 07/27/2008   Seizure disorder ([place]) 02/05/2007    Past Surgical History:  Procedure Laterality Date   BUBBLE STUDY  11/15/2019   Procedure: BUBBLE STUDY;  Surgeon: Dorothy Spark, MD;  Location: [facility];  Service: Cardiovascular;;   COLONOSCOPY     2012-normal , Dr Sharlett Iles   ESOPHAGOGASTRODUODENOSCOPY     normal-Dr Patterson 2012   LOOP RECORDER INSERTION N/A 05/30/2017   Procedure: Loop Recorder Insertion;  Surgeon: Constance Haw, MD;  Location: [facility];  Service: Cardiovascular;  Laterality: N/A;   LOOP RECORDER REMOVAL N/A 03/04/2018   Procedure: LOOP RECORDER REMOVAL;  Surgeon: Constance Haw, MD;  Location: [facility];  Service: Cardiovascular;  Laterality: N/A;   MYRINGOTOMY WITH TUBE PLACEMENT     MYRINGOTOMY WITH TUBE PLACEMENT Right 11/05/2017   Procedure: MYRINGOTOMY WITH TUBE PLACEMENT;  Surgeon: Melissa Montane, MD;  Location: [facility];  Service: ENT;  Laterality: Right;  right T Tube placement   NASAL SINUS SURGERY     TEE WITHOUT CARDIOVERSION N/A 05/30/2017  Procedure: TRANSESOPHAGEAL ECHOCARDIOGRAM (TEE);  Surgeon: Acie Fredrickson Wonda Cheng, MD;  Location: [facility];  Service: Cardiovascular;  Laterality: N/A;   TEE WITHOUT CARDIOVERSION N/A 11/15/2019   Procedure: TRANSESOPHAGEAL ECHOCARDIOGRAM (TEE);  Surgeon: Dorothy Spark, MD;  Location: [facility];  Service: Cardiovascular;  Laterality: N/A;    OB History   No obstetric history on file.      Home Medications    Prior to Admission medications   Medication Sig Start Date End Date Taking? Authorizing Provider  promethazine-dextromethorphan (PROMETHAZINE-DM) 6.25-15 MG/5ML  syrup Take 5 mLs by mouth 4 (four) times daily as needed for cough. 08/27/21  Yes Shyler Hamill, Myrene Galas, MD  acetaminophen (TYLENOL) 325 MG tablet Take 2 tablets (650 mg total) by mouth every 4 (four) hours as needed for mild pain (or temp > 37.5 C (99.5 F)). 10/19/19   Angiulli, Lavon Paganini, PA-C  ARIPiprazole (ABILIFY) 2 MG tablet 1 qam 04/10/21   Plovsky, Berneta Sages, MD  ARIPiprazole (ABILIFY) 5 MG tablet Take 5 mg by mouth every morning. 07/04/21   [provider]  atorvastatin (LIPITOR) 40 MG tablet TAKE 1 TABLET (40 MG TOTAL) BY MOUTH DAILY AT 6 PM FOR CHOLESTEROL Patient taking differently: Take 40 mg by mouth daily. 05/10/21   Horald Pollen, MD  carvedilol (COREG) 6.25 MG tablet Take 6.25 mg by mouth 2 (two) times daily. 05/20/21   [provider]  Dulaglutide (TRULICITY) 1.5 0000000 SOPN Inject 1.5 mg into the skin once a week. 01/18/21   Horald Pollen, MD  hydrALAZINE (APRESOLINE) 10 MG tablet Take 1 tablet (10 mg total) by mouth 2 (two) times daily. 01/04/20 07/31/21  Horald Pollen, MD  isosorbide dinitrate (ISORDIL) 10 MG tablet Take 1 tablet (10 mg total) by mouth 2 (two) times daily. 01/04/20 07/31/21  Horald Pollen, MD  JANUVIA 50 MG tablet Take 50 mg by mouth daily. 06/18/21   [provider]  lamoTRIgine (LAMICTAL) 200 MG tablet TAKE 1 TABLET BY MOUTH TWICE A DAY 06/19/21   [name], NP  lamoTRIgine (LAMICTAL) 25 MG tablet TAKE 2 TABLETS (50 MG TOTAL) BY MOUTH AT BEDTIME. TOTAL DAILY DOSE OF LAMOTRIGINE '200MG'$  IN AM AND '250MG'$  IN PM. 08/14/21   [name], MD  torsemide (DEMADEX) 10 MG tablet Take 10 mg by mouth daily.    [provider]  zonisamide (ZONEGRAN) 25 MG capsule One capsule twice a day for 2 weeks, then take 2 capsules twice a day 07/31/21   [name], MD  methylphenidate (RITALIN) 5 MG tablet Take 1 tablet (5 mg total) by mouth 2 (two) times daily. Patient not taking: Reported on 12/29/2019 10/29/19 01/01/20   Izora Ribas, MD  traZODone (DESYREL) 100 MG tablet Take 1 tablet (100 mg total) by mouth at bedtime. Patient not taking: Reported on 12/29/2019 10/19/19 01/01/20  Angiulli, Lavon Paganini, PA-C    Family History Family History  Problem Relation Age of Onset   Diabetes Mother        passed away from accidental death   Hypertension Mother    Bipolar disorder Father    Diabetes Daughter    Leukemia Daughter    Ovarian cancer Maternal Aunt     Social History Social History   Tobacco Use   Smoking status: Never   Smokeless tobacco: Never  Vaping Use   Vaping Use: Never used  Substance Use Topics   Alcohol use: No   Drug use: No     Allergies  Amoxicillin, Lisinopril, Hydrocodone, and Tegretol [carbamazepine]   Review of Systems Review of Systems  Constitutional: Negative.   HENT: Negative.    Respiratory:  Positive for cough. Negative for shortness of breath and wheezing.   Cardiovascular: Negative.   Gastrointestinal: Negative.     Physical Exam Triage Vital Signs ED Triage Vitals  Enc Vitals Group     BP --      Pulse Rate 08/27/21 1207 62     Resp 08/27/21 1207 17     Temp 08/27/21 1207 98.7 F (37.1 C)     Temp Source 08/27/21 1207 Oral     SpO2 08/27/21 1207 100 %     Weight --      Height --      Head Circumference --      Peak Flow --      Pain Score 08/27/21 1208 0     Pain Loc --      Pain Edu? --      Excl. in [place]? --    No data found.  Updated Vital Signs Pulse 62   Temp 98.7 F (37.1 C) (Oral)   Resp 17   LMP  (LMP Unknown)   SpO2 100%   Visual Acuity Right Eye Distance:   Left Eye Distance:   Bilateral Distance:    Right Eye Near:   Left Eye Near:    Bilateral Near:     Physical Exam Vitals and nursing note reviewed.  Constitutional:      General: She is not in acute distress.    Appearance: She is obese. She is not ill-appearing.  Cardiovascular:     Rate and Rhythm: Normal rate and regular rhythm.     Pulses: Normal  pulses.     Heart sounds: Normal heart sounds.  Pulmonary:     Effort: Pulmonary effort is normal.     Breath sounds: Normal breath sounds.  Abdominal:     General: Bowel sounds are normal.     Palpations: Abdomen is soft.  Musculoskeletal:        General: Normal range of motion.  Neurological:     Mental Status: She is alert.     UC Treatments / Results  Labs (all labs ordered are listed, but only abnormal results are displayed) Labs Reviewed  SARS CORONAVIRUS 2 (TAT 6-24 HRS)    EKG   Radiology DG Chest 2 View  Result Date: 08/27/2021 CLINICAL DATA:  Cough EXAM: CHEST - 2 VIEW COMPARISON:  10/31/2020 FINDINGS: Stable mild cardiomegaly. No focal airspace consolidation, pleural effusion, or pneumothorax. IMPRESSION: Stable mild cardiomegaly. Electronically Signed   By: Davina Poke D.O.   On: 08/27/2021 13:25    Procedures Procedures (including critical care time)  Medications Ordered in UC Medications - No data to display  Initial Impression / Assessment and Plan / UC Course  I have reviewed the triage vital signs and the nursing notes.  Pertinent labs & imaging results that were available during my care of the patient were reviewed by me and considered in my medical decision making (see chart for details).     1.  Viral URI with cough: Chest x-ray is negative for acute lung infiltrate Promethazine DM as needed for cough COVID-19 PCR test has been sent We will call patient with recommendations if labs are abnormal Return to urgent care if symptoms worsen. Final Clinical Impressions(s) / UC Diagnoses   Final diagnoses:  Viral URI with cough     Discharge Instructions  Take medications as prescribed Please quarantine until COVID-19 test results available Your chest x-ray is negative for pneumonia Return to urgent care if symptoms worsen We will call you with recommendations if labs are abnormal.   ED Prescriptions     Medication Sig Dispense  Auth. Provider   promethazine-dextromethorphan (PROMETHAZINE-DM) 6.25-15 MG/5ML syrup Take 5 mLs by mouth 4 (four) times daily as needed for cough. 118 mL Jamall Strohmeier, Myrene Galas, MD      PDMP not reviewed this encounter.   Chase Picket, MD 08/27/21 1332

## 2021-08-27 NOTE — Discharge Instructions (Addendum)
Take medications as prescribed Please quarantine until COVID-19 test results available Your chest x-ray is negative for pneumonia Return to urgent care if symptoms worsen We will call you with recommendations if labs are abnormal.

## 2021-09-05 ENCOUNTER — Encounter: Payer: Self-pay | Admitting: Gastroenterology

## 2021-09-11 ENCOUNTER — Ambulatory Visit (HOSPITAL_COMMUNITY)
Admission: EM | Admit: 2021-09-11 | Discharge: 2021-09-11 | Disposition: A | Discharge status: Home / Self Care | Payer: Medicare HMO | Attending: Family Medicine | Admitting: Family Medicine

## 2021-09-11 ENCOUNTER — Other Ambulatory Visit: Payer: Self-pay

## 2021-09-11 ENCOUNTER — Encounter (HOSPITAL_COMMUNITY): Payer: Self-pay | Admitting: Emergency Medicine

## 2021-09-11 DIAGNOSIS — R052 Subacute cough: Secondary | ICD-10-CM | POA: Insufficient documentation

## 2021-09-11 DIAGNOSIS — R350 Frequency of micturition: Secondary | ICD-10-CM | POA: Diagnosis present

## 2021-09-11 DIAGNOSIS — R0982 Postnasal drip: Secondary | ICD-10-CM | POA: Insufficient documentation

## 2021-09-11 DIAGNOSIS — R8290 Unspecified abnormal findings in urine: Secondary | ICD-10-CM | POA: Insufficient documentation

## 2021-09-11 LAB — POCT URINALYSIS DIPSTICK, ED / UC
Bilirubin Urine: NEGATIVE
Glucose, UA: NEGATIVE mg/dL
Hgb urine dipstick: NEGATIVE
Ketones, ur: NEGATIVE mg/dL
Leukocytes,Ua: NEGATIVE
Nitrite: NEGATIVE
Protein, ur: 30 mg/dL — AB
Specific Gravity, Urine: >=1.03 (ref 1.005–1.030)
Urobilinogen, UA: 1 mg/dL (ref 0.0–1.0)
pH: 6 (ref 5.0–8.0)

## 2021-09-11 MED ORDER — PROMETHAZINE-DM 6.25-15 MG/5ML PO SYRP: 5.0000 mL | ORAL_SOLUTION | Freq: Four times a day (QID) | ORAL | 0 refills | Status: DC | PRN

## 2021-09-11 MED ORDER — CETIRIZINE HCL 10 MG PO TABS: 10.0000 mg | ORAL_TABLET | Freq: Every day | ORAL | 2 refills | Status: DC

## 2021-09-11 MED ORDER — BUDESONIDE-FORMOTEROL FUMARATE 160-4.5 MCG/ACT IN AERO: 2.0000 | INHALATION_SPRAY | Freq: Two times a day (BID) | RESPIRATORY_TRACT | 0 refills | Status: DC

## 2021-09-11 NOTE — ED Provider Notes (Signed)
[facility]    CSN: [id number] Arrival date & time: 09/11/21  1326      History   Chief Complaint Chief Complaint  Patient presents with   Cough   Urinary Frequency    HPI Brandi Gomez is a 50 y.o. female.   Patient presenting today with multiple complaints.  She states that she has had a cough and postnasal drainage for the past several weeks, seen about 2 weeks ago, tested negative for COVID and given Phenergan DM which she states did help temporarily until she ran out.  She denies fever, chills, wheezing, chest pain, shortness of breath, sore throat, headaches.  Not currently taking anything over-the-counter for symptoms.  No known history of seasonal allergies, asthma or other pulmonary issues.  She is also having several weeks of urinary frequency, dark urine and an odor.  Denies vaginal discharge, vaginal pain, abdominal pain, nausea vomiting or diarrhea.  Does have a history of uncontrolled diabetes with sugars in the high 200s at this time.  Not taking anything over-the-counter for the symptoms.  Past Medical History:  Diagnosis Date   Anemia    Anxiety    Bipolar 1 disorder ([place])    Common migraine 05/19/2015   Depression    Diabetes mellitus, type II ([place])    Hypertension    Irritable bowel syndrome (IBS)    Mild mental retardation    Obesity    Partial complex seizure disorder with intractable epilepsy ([place]) 05/12/2014   Seizures ([place])    intractable, sz 08/23/17   Sleep apnea    Stroke ([place])    Type II or unspecified type diabetes mellitus without mention of complication, not stated as uncontrolled     Patient Active Problem List   Diagnosis Date Noted   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, unspecified cerebrovascular disease type ([place]) 01/18/2021   COVID-19 10/31/2020   Morbid (severe) obesity due to excess calories ([place]) 04/18/2020   HLD (hyperlipidemia) 10/12/2019   CKD (chronic  kidney disease) stage 3, GFR 30-59 ml/min (HCC) 10/12/2019   Chronic combined systolic and diastolic heart failure ([place])    Depression 07/14/2019   Bipolar disorder ([place]) 05/13/2019   Bipolar 1 disorder ([place]) 05/13/2019   Adjustment disorder with anxiety    History of recent stroke 08/06/2017   OSA (obstructive sleep apnea)    Controlled type 2 diabetes mellitus without complication, without long-term current use of insulin ([place]) 05/08/2017   Hypertension associated with diabetes ([place]) 07/27/2008   Seizure disorder ([place]) 02/05/2007    Past Surgical History:  Procedure Laterality Date   BUBBLE STUDY  11/15/2019   Procedure: BUBBLE STUDY;  Surgeon: Dorothy Spark, MD;  Location: [facility];  Service: Cardiovascular;;   COLONOSCOPY     2012-normal , Dr Sharlett Iles   ESOPHAGOGASTRODUODENOSCOPY     normal-Dr Patterson 2012   LOOP RECORDER INSERTION N/A 05/30/2017   Procedure: Loop Recorder Insertion;  Surgeon: Constance Haw, MD;  Location: [facility];  Service: Cardiovascular;  Laterality: N/A;   LOOP RECORDER REMOVAL N/A 03/04/2018   Procedure: LOOP RECORDER REMOVAL;  Surgeon: Constance Haw, MD;  Location: [facility];  Service: Cardiovascular;  Laterality: N/A;   MYRINGOTOMY WITH TUBE PLACEMENT     MYRINGOTOMY WITH TUBE PLACEMENT Right 11/05/2017   Procedure: MYRINGOTOMY WITH TUBE PLACEMENT;  Surgeon: Melissa Montane, MD;  Location: [facility];  Service: ENT;  Laterality: Right;  right T Tube placement  NASAL SINUS SURGERY     TEE WITHOUT CARDIOVERSION N/A 05/30/2017   Procedure: TRANSESOPHAGEAL ECHOCARDIOGRAM (TEE);  Surgeon: Acie Fredrickson Wonda Cheng, MD;  Location: [facility];  Service: Cardiovascular;  Laterality: N/A;   TEE WITHOUT CARDIOVERSION N/A 11/15/2019   Procedure: TRANSESOPHAGEAL ECHOCARDIOGRAM (TEE);  Surgeon: Dorothy Spark, MD;  [facility];  Service: Cardiovascular;  Laterality: N/A;    OB History   No obstetric history on file.       Home Medications    Prior to Admission medications   Medication Sig Start Date End Date Taking? Authorizing Provider  budesonide-formoterol (SYMBICORT) 160-4.5 MCG/ACT inhaler Inhale 2 puffs into the lungs 2 (two) times daily. Rinse mouth with water after each use 09/11/21  Yes Volney American, PA-C  cetirizine (ZYRTEC ALLERGY) 10 MG tablet Take 1 tablet (10 mg total) by mouth daily. 09/11/21  Yes Volney American, PA-C  promethazine-dextromethorphan (PROMETHAZINE-DM) 6.25-15 MG/5ML syrup Take 5 mLs by mouth 4 (four) times daily as needed for cough. 09/11/21  Yes Volney American, PA-C  acetaminophen (TYLENOL) 325 MG tablet Take 2 tablets (650 mg total) by mouth every 4 (four) hours as needed for mild pain (or temp > 37.5 C (99.5 F)). 10/19/19   Angiulli, Lavon Paganini, PA-C  ARIPiprazole (ABILIFY) 2 MG tablet 1 qam 04/10/21   Plovsky, Berneta Sages, MD  ARIPiprazole (ABILIFY) 5 MG tablet Take 5 mg by mouth every morning. 07/04/21   [provider]  atorvastatin (LIPITOR) 40 MG tablet TAKE 1 TABLET (40 MG TOTAL) BY MOUTH DAILY AT 6 PM FOR CHOLESTEROL Patient taking differently: Take 40 mg by mouth daily. 05/10/21   Horald Pollen, MD  carvedilol (COREG) 6.25 MG tablet Take 6.25 mg by mouth 2 (two) times daily. 05/20/21   [provider]  Dulaglutide (TRULICITY) 1.5 [place]/7.0[place] SOPN Inject 1.5 mg into the skin once a week. 01/18/21   Horald Pollen, MD  hydrALAZINE (APRESOLINE) 10 MG tablet Take 1 tablet (10 mg total) by mouth 2 (two) times daily. 01/04/20 07/31/21  Horald Pollen, MD  isosorbide dinitrate (ISORDIL) 10 MG tablet Take 1 tablet (10 mg total) by mouth 2 (two) times daily. 01/04/20 07/31/21  Horald Pollen, MD  JANUVIA 50 MG tablet Take 50 mg by mouth daily. 06/18/21   [provider]  lamoTRIgine (LAMICTAL) 200 MG tablet TAKE 1 TABLET BY MOUTH TWICE A DAY 06/19/21   [name], NP  lamoTRIgine (LAMICTAL) 25 MG tablet TAKE 2 TABLETS  (50 MG TOTAL) BY MOUTH AT BEDTIME. TOTAL DAILY DOSE OF LAMOTRIGINE 200MG  IN AM AND 250MG  IN PM. 08/14/21   [name], MD  promethazine-dextromethorphan (PROMETHAZINE-DM) 6.25-15 MG/5ML syrup Take 5 mLs by mouth 4 (four) times daily as needed for cough. 08/27/21   Chase Picket, MD  torsemide (DEMADEX) 10 MG tablet Take 10 mg by mouth daily.    [provider]  zonisamide (ZONEGRAN) 25 MG capsule One capsule twice a day for 2 weeks, then take 2 capsules twice a day 07/31/21   [name], MD  methylphenidate (RITALIN) 5 MG tablet Take 1 tablet (5 mg total) by mouth 2 (two) times daily. Patient not taking: Reported on 12/29/2019 10/29/19 01/01/20  Izora Ribas, MD  traZODone (DESYREL) 100 MG tablet Take 1 tablet (100 mg total) by mouth at bedtime. Patient not taking: Reported on 12/29/2019 10/19/19 01/01/20  Angiulli, Lavon Paganini, PA-C    Family History Family History  Problem Relation Age of Onset  Diabetes Mother        passed away from accidental death   Hypertension Mother    Bipolar disorder Father    Diabetes Daughter    Leukemia Daughter    Ovarian cancer Maternal Aunt     Social History Social History   Tobacco Use   Smoking status: Never   Smokeless tobacco: Never  Vaping Use   Vaping Use: Never used  Substance Use Topics   Alcohol use: No   Drug use: No     Allergies   Amoxicillin, Lisinopril, Hydrocodone, and Tegretol [carbamazepine]   Review of Systems Review of Systems Per HPI  Physical Exam Triage Vital Signs ED Triage Vitals  Enc Vitals Group     BP 09/11/21 1525 139/77     Pulse Rate 09/11/21 1523 80     Resp 09/11/21 1523 18     Temp 09/11/21 1523 97.9 F (36.6 C)     Temp src --      SpO2 09/11/21 1523 (!) 78 %     Weight --      Height --      Head Circumference --      Peak Flow --      Pain Score 09/11/21 1521 0     Pain Loc --      Pain Edu? --      Excl. in [place]? --    No data found.  Updated Vital Signs BP  139/77   Pulse 80   Temp 97.9 F (36.6 C)   Resp 18   LMP  (LMP Unknown)   SpO2 97%   Visual Acuity Right Eye Distance:   Left Eye Distance:   Bilateral Distance:    Right Eye Near:   Left Eye Near:    Bilateral Near:     Physical Exam Vitals and nursing note reviewed.  Constitutional:      Appearance: Normal appearance. She is not ill-appearing.  HENT:     Head: Atraumatic.     Right Ear: Tympanic membrane normal.     Left Ear: Tympanic membrane normal.     Nose: Rhinorrhea present.     Mouth/Throat:     Mouth: Mucous membranes are moist.     Pharynx: Posterior oropharyngeal erythema present. No oropharyngeal exudate.  Eyes:     Extraocular Movements: Extraocular movements intact.     Conjunctiva/sclera: Conjunctivae normal.  Cardiovascular:     Rate and Rhythm: Normal rate and regular rhythm.     Heart sounds: Normal heart sounds.  Pulmonary:     Effort: Pulmonary effort is normal. No respiratory distress.     Breath sounds: Normal breath sounds. No wheezing or rales.  Abdominal:     General: Bowel sounds are normal. There is no distension.     Palpations: Abdomen is soft.     Tenderness: There is no abdominal tenderness. There is no right CVA tenderness, left CVA tenderness or guarding.  Musculoskeletal:        General: Normal range of motion.     Cervical back: Normal range of motion and neck supple.  Skin:    General: Skin is warm and dry.     Findings: No erythema or rash.  Neurological:     Mental Status: She is alert and oriented to person, place, and time.     Motor: No weakness.     Gait: Gait normal.  Psychiatric:        Mood and Affect: Mood normal.  Thought Content: Thought content normal.        Judgment: Judgment normal.   UC Treatments / Results  Labs (all labs ordered are listed, but only abnormal results are displayed) Labs Reviewed  POCT URINALYSIS DIPSTICK, ED / UC - Abnormal; Notable for the following components:      Result  Value   Protein, ur 30 (*)    All other components within normal limits  CERVICOVAGINAL ANCILLARY ONLY    EKG  Radiology No results found.  Procedures Procedures (including critical care time)  Medications Ordered in UC Medications - No data to display  Initial Impression / Assessment and Plan / UC Course  I have reviewed the triage vital signs and the nursing notes.  Pertinent labs & imaging results that were available during my care of the patient were reviewed by me and considered in my medical decision making (see chart for details).     Given ongoing nature of her postnasal drainage and cough, suspect related to seasonal allergies.  We will treat with Symbicort inhaler, Zyrtec, Flonase.  No evidence of bacterial infection at this time and lungs clear to auscultation bilaterally, oxygen saturation 97% on room air and she is breathing comfortably.  UA negative for urinary tract infection, suspect her urinary symptoms are related to poorly controlled blood sugars which we discussed at length.  Vaginal swab pending in case vaginal infection.  Will treat based on results.  Final Clinical Impressions(s) / UC Diagnoses   Final diagnoses:  Post-nasal drip  Subacute cough  Urinary frequency  Abnormal urine odor   Discharge Instructions   None    ED Prescriptions     Medication Sig Dispense Auth. Provider   cetirizine (ZYRTEC ALLERGY) 10 MG tablet Take 1 tablet (10 mg total) by mouth daily. 30 tablet Volney American, [place]   budesonide-formoterol [facility]) 160-4.5 MCG/ACT inhaler Inhale 2 puffs into the lungs 2 (two) times daily. Rinse mouth with water after each use 1 each Volney American, PA-C   promethazine-dextromethorphan (PROMETHAZINE-DM) 6.25-15 MG/5ML syrup Take 5 mLs by mouth 4 (four) times daily as needed for cough. 100 mL Volney American, [place]      PDMP not reviewed this encounter.   Volney American, [place] 09/11/21 1717

## 2021-09-11 NOTE — ED Triage Notes (Signed)
Pt is present toddy with urinary frequency, slight odor to urine, and a cough. Pt states UTI sx started two weeks ago. Pt states that her cough started one week ago.

## 2021-09-12 ENCOUNTER — Other Ambulatory Visit: Payer: Self-pay | Admitting: Neurology

## 2021-09-12 LAB — CERVICOVAGINAL ANCILLARY ONLY
Bacterial Vaginitis (gardnerella): POSITIVE — AB
Candida Glabrata: NEGATIVE
Candida Vaginitis: POSITIVE — AB
Comment: NEGATIVE
Comment: NEGATIVE
Comment: NEGATIVE

## 2021-09-13 ENCOUNTER — Telehealth (HOSPITAL_COMMUNITY): Payer: Self-pay | Admitting: Emergency Medicine

## 2021-09-13 MED ORDER — METRONIDAZOLE 500 MG PO TABS: 500.0000 mg | ORAL_TABLET | Freq: Two times a day (BID) | ORAL | 0 refills | Status: DC

## 2021-09-13 MED ORDER — FLUCONAZOLE 150 MG PO TABS: 150.0000 mg | ORAL_TABLET | Freq: Once | ORAL | 0 refills | Status: AC

## 2021-09-14 ENCOUNTER — Other Ambulatory Visit: Payer: Self-pay | Admitting: Emergency Medicine

## 2021-09-14 DIAGNOSIS — E1159 Type 2 diabetes mellitus with other circulatory complications: Secondary | ICD-10-CM

## 2021-09-20 ENCOUNTER — Other Ambulatory Visit: Payer: Self-pay | Admitting: Emergency Medicine

## 2021-09-24 ENCOUNTER — Telehealth: Payer: Self-pay | Admitting: Neurology

## 2021-09-24 MED ORDER — LAMOTRIGINE 25 MG PO TABS: ORAL_TABLET | ORAL | 0 refills | Status: DC

## 2021-09-24 NOTE — Telephone Encounter (Signed)
Refill sent as requested. 

## 2021-09-24 NOTE — Telephone Encounter (Signed)
Pt is needing a refill on her lamoTRIgine (LAMICTAL) 25 MG tablet sent [facility] to [facility] on [address].

## 2021-10-03 ENCOUNTER — Other Ambulatory Visit (HOSPITAL_COMMUNITY): Payer: Self-pay | Admitting: Psychiatry

## 2021-10-12 ENCOUNTER — Other Ambulatory Visit: Payer: Self-pay | Admitting: Family Medicine

## 2021-10-12 ENCOUNTER — Other Ambulatory Visit: Payer: Self-pay

## 2021-10-12 NOTE — Patient Outreach (Signed)
[facility]) Care Management  10/12/2021  SHRAVYA WICKWIRE August 15, 1971 [id number]   Telephone call to patient for disease management follow up.   No answer.  HIPAA compliant voice message left.    Plan: If no return call, RN CM will attempt patient again in February.  [name], RN, MSN [place] Management Care Management Coordinator Direct Line [phone] Cell [phone] Toll Free: [phone]  Fax: [phone]

## 2021-10-16 ENCOUNTER — Ambulatory Visit: Payer: Self-pay

## 2021-10-17 ENCOUNTER — Other Ambulatory Visit: Payer: Self-pay | Admitting: Neurology

## 2021-10-27 ENCOUNTER — Other Ambulatory Visit: Payer: Self-pay | Admitting: Neurology

## 2021-11-16 ENCOUNTER — Other Ambulatory Visit: Payer: Self-pay | Admitting: Emergency Medicine

## 2021-11-16 ENCOUNTER — Other Ambulatory Visit: Payer: Self-pay | Admitting: Diagnostic Neuroimaging

## 2021-11-16 DIAGNOSIS — E1159 Type 2 diabetes mellitus with other circulatory complications: Secondary | ICD-10-CM

## 2021-12-06 ENCOUNTER — Other Ambulatory Visit: Payer: Self-pay | Admitting: Registered Nurse

## 2021-12-06 DIAGNOSIS — I70202 Unspecified atherosclerosis of native arteries of extremities, left leg: Secondary | ICD-10-CM

## 2021-12-17 ENCOUNTER — Other Ambulatory Visit: Payer: Self-pay | Admitting: Neurology

## 2021-12-17 NOTE — Telephone Encounter (Signed)
Rx refilled.

## 2021-12-19 ENCOUNTER — Other Ambulatory Visit: Payer: Self-pay | Admitting: Registered Nurse

## 2021-12-19 ENCOUNTER — Other Ambulatory Visit: Payer: Medicare HMO

## 2021-12-19 ENCOUNTER — Other Ambulatory Visit: Payer: Self-pay

## 2021-12-19 ENCOUNTER — Telehealth (HOSPITAL_BASED_OUTPATIENT_CLINIC_OR_DEPARTMENT_OTHER): Payer: Medicare HMO | Admitting: Psychiatry

## 2021-12-19 DIAGNOSIS — F22 Delusional disorders: Secondary | ICD-10-CM

## 2021-12-19 DIAGNOSIS — Z1231 Encounter for screening mammogram for malignant neoplasm of breast: Secondary | ICD-10-CM

## 2021-12-19 NOTE — Progress Notes (Addendum)
Psychiatric Initial Adult Assessment   Patient Identification: Brandi Gomez MRN:  [id number] Date of Evaluation:  12/19/2021 Referral Source: Dr. Hayden Pedro Chief Complaint:  Suspiciousness/paranoia Visit Diagnosis: Mood disorder secondary to CVA   Today the patient is at her baseline.  We interviewed her with her daughter in the room.  Unfortunately her boyfriend is fishing.  The patient seems to be doing okay.  Importantly she does not have a blood suspiciousness and paranoia as in the past.  The patient is very limited in that she is morbidly obese over 350 pounds is having knee problems and cannot get out of her house.  Years ago she became so suspicious and paranoid that she would leave her home and take off for days or weeks.  Now she is so physically limited even if she wanted to she could leave.  However the patient denies being paranoid or uncomfortable.  She denies auditory visualizations.  She denies being depressed in any way.  She enjoys playing games on her phone and reading from the phone and enjoys television soap operas and games.  She obviously is eating well and sleeping okay.  She has a very sedentary lifestyle.  She is even gaining more weight.  I wanted her to come to see me today but it was just impossible.  Virtual Visit via Telephone Note  I connected with Brandi Gomez on 08/26/23 at  2:30 PM EST by telephone and verified that I am speaking with the correct person using two identifiers.  Location: Patient: home Provider: office   I discussed the limitations, risks, security and privacy concerns of performing an evaluation and management service by telephone and the availability of in person appointments. I also discussed with the patient that there may be a patient responsible charge related to this service. The patient expressed understanding and agreed to proceed.   History of Present Illness:    Observations/Objective:   Assessment and Plan:   Follow  Up Instructions:    I discussed the assessment and treatment plan with the patient. The patient was provided an opportunity to ask questions and all were answered. The patient agreed with the plan and demonstrated an understanding of the instructions.   The patient was advised to call back or seek an in-person evaluation if the symptoms worsen or if the condition fails to improve as anticipated.  I provided 30 minutes of non-face-to-face time during this encounter.   [name], MD    (Hypo) Manic Symptoms:   Anxiety Symptoms:   Psychotic Symptoms:   PTSD Symptoms:   Past Psychiatric History: Cymbalta 30 mg Previous Psychotropic Medications: Cymbalta 30 mg  Substance Abuse History in the last 12 months:  No.  Consequences of Substance Abuse:   Past Medical History:  Past Medical History:  Diagnosis Date   Anemia    Anxiety    Bipolar 1 disorder (HCC)    Common migraine 05/19/2015   Depression    Diabetes mellitus, type II (HCC)    Hypertension    Irritable bowel syndrome (IBS)    Mild mental retardation    Obesity    Partial complex seizure disorder with intractable epilepsy (HCC) 05/12/2014   Seizures (HCC)    intractable, sz 08/23/17   Sleep apnea    Stroke (HCC)    Type II or unspecified type diabetes mellitus without mention of complication, not stated as uncontrolled     Past Surgical History:  Procedure Laterality Date   BUBBLE STUDY  11/15/2019   Procedure: BUBBLE STUDY;  Surgeon: Lars Masson, MD;  Location: [facility] ENDOSCOPY;  Service: Cardiovascular;;   COLONOSCOPY     2012-normal , Dr Jarold Motto   ESOPHAGOGASTRODUODENOSCOPY     normal-Dr Jarold Motto 2012   LOOP RECORDER INSERTION N/A 05/30/2017   Procedure: Loop Recorder Insertion;  Surgeon: Regan Lemming, MD;  Location: [facility] INVASIVE CV LAB;  Service: Cardiovascular;  Laterality: N/A;   LOOP RECORDER REMOVAL N/A 03/04/2018   Procedure: LOOP RECORDER REMOVAL;  Surgeon: Regan Lemming, MD;   Location: [facility] INVASIVE CV LAB;  Service: Cardiovascular;  Laterality: N/A;   MYRINGOTOMY WITH TUBE PLACEMENT     MYRINGOTOMY WITH TUBE PLACEMENT Right 11/05/2017   Procedure: MYRINGOTOMY WITH TUBE PLACEMENT;  Surgeon: Suzanna Obey, MD;  Location: [facility] OR;  Service: ENT;  Laterality: Right;  right T Tube placement   NASAL SINUS SURGERY     TEE WITHOUT CARDIOVERSION N/A 05/30/2017   Procedure: TRANSESOPHAGEAL ECHOCARDIOGRAM (TEE);  Surgeon: Elease Hashimoto Deloris Ping, MD;  Location: [facility] ENDOSCOPY;  Service: Cardiovascular;  Laterality: N/A;   TEE WITHOUT CARDIOVERSION N/A 11/15/2019   Procedure: TRANSESOPHAGEAL ECHOCARDIOGRAM (TEE);  Surgeon: Lars Masson, MD;  Location: [facility] ENDOSCOPY;  Service: Cardiovascular;  Laterality: N/A;    Family Psychiatric History:   Family History:  Family History  Problem Relation Age of Onset   Diabetes Mother        passed away from accidental death   Hypertension Mother    Bipolar disorder Father    Diabetes Daughter    Leukemia Daughter    Ovarian cancer Maternal Aunt     Social History:   Social History   Socioeconomic History   Marital status: Single    Spouse name: [name]   Number of children: 3   Years of education: 12   Highest education level: Not on file  Occupational History   Occupation: disabled    Employer: DISABLED  Tobacco Use   Smoking status: Never   Smokeless tobacco: Never  Vaping Use   Vaping Use: Never used  Substance and Sexual Activity   Alcohol use: No   Drug use: No   Sexual activity: Never  Other Topics Concern   Not on file  Social History Narrative   Patient lives at home with daughter.    Patient has 3 children.    Patient is right handed.    Patient has a high school education.    Patient is on disability   Patient drinks 2 cups of caffeine daily.   Social Determinants of Corporate investment banker Strain: Not on file  Food Insecurity: No Food Insecurity   Worried About Programme researcher, broadcasting/film/video in the Last Year:  Never true   Ran Out of Food in the Last Year: Never true  Transportation Needs: Not on file  Physical Activity: Not on file  Stress: Not on file  Social Connections: Not on file    Additional Social History:   Allergies:   Allergies  Allergen Reactions   Amoxicillin Itching    Did it involve swelling of the face/tongue/throat, SOB, or low BP? No Did it involve sudden or severe rash/hives, skin peeling, or any reaction on the inside of your mouth or nose? No Did you need to seek medical attention at a hospital or doctor's office? No When did it last happen?      within the past 10 years If all above answers are "NO", may proceed with cephalosporin use.  Lisinopril Swelling    Angioedema    Hydrocodone Other (See Comments)    Depressed    Tegretol [Carbamazepine] Swelling    Throat swells    Metabolic Disorder Labs: Lab Results  Component Value Date   HGBA1C 7.9 (A) 01/18/2021   MPG 180.03 10/31/2020   MPG 139.85 10/12/2019   No results found for: PROLACTIN Lab Results  Component Value Date   CHOL 108 04/18/2020   TRIG 100 10/31/2020   HDL 52 04/18/2020   CHOLHDL 2.1 04/18/2020   VLDL 10 10/12/2019   LDLCALC 44 04/18/2020   LDLCALC 64 10/12/2019     Current Medications: Current Outpatient Medications  Medication Sig Dispense Refill   acetaminophen (TYLENOL) 325 MG tablet Take 2 tablets (650 mg total) by mouth every 4 (four) hours as needed for mild pain (or temp > 37.5 C (99.5 F)).     ARIPiprazole (ABILIFY) 5 MG tablet Take 5 mg by mouth every morning.     atorvastatin (LIPITOR) 40 MG tablet TAKE 1 TABLET (40 MG TOTAL) BY MOUTH DAILY AT 6 PM FOR CHOLESTEROL 90 tablet 0   budesonide-formoterol (SYMBICORT) 160-4.5 MCG/ACT inhaler Inhale 2 puffs into the lungs 2 (two) times daily. Rinse mouth with water after each use 1 each 0   carvedilol (COREG) 6.25 MG tablet Take 6.25 mg by mouth 2 (two) times daily.     cetirizine (ZYRTEC ALLERGY) 10 MG tablet Take 1 tablet  (10 mg total) by mouth daily. 30 tablet 2   Dulaglutide (TRULICITY) 1.5 MG/0.5ML SOPN Inject 1.5 mg into the skin once a week. 6 mL 5   hydrALAZINE (APRESOLINE) 10 MG tablet Take 1 tablet (10 mg total) by mouth 2 (two) times daily. 180 tablet 3   isosorbide dinitrate (ISORDIL) 10 MG tablet Take 1 tablet (10 mg total) by mouth 2 (two) times daily. 180 tablet 3   JANUVIA 50 MG tablet TAKE 1 TABLET BY MOUTH EVERY DAY 90 tablet 3   lamoTRIgine (LAMICTAL) 200 MG tablet TAKE 1 TABLET BY MOUTH TWICE A DAY 180 tablet 3   lamoTRIgine (LAMICTAL) 25 MG tablet TAKE 2 TABLETS (50 MG TOTAL) BY MOUTH AT BEDTIME.TOTAL DAILY DOSE OF LAMOTRIGINE 200MG  AM & 250MG  PM 60 tablet 0   metroNIDAZOLE (FLAGYL) 500 MG tablet Take 1 tablet (500 mg total) by mouth 2 (two) times daily. 14 tablet 0   promethazine-dextromethorphan (PROMETHAZINE-DM) 6.25-15 MG/5ML syrup Take 5 mLs by mouth 4 (four) times daily as needed for cough. 118 mL 0   promethazine-dextromethorphan (PROMETHAZINE-DM) 6.25-15 MG/5ML syrup Take 5 mLs by mouth 4 (four) times daily as needed for cough. 100 mL 0   torsemide (DEMADEX) 10 MG tablet Take 10 mg by mouth daily.     zonisamide (ZONEGRAN) 25 MG capsule TAKE 2 CAPSULES TWICE A DAY 360 capsule 1   No current facility-administered medications for this visit.    Neurologic: Headache: No Seizure: Yes Paresthesias:No  Musculoskeletal: Strength & Muscle Tone: within normal limits Gait & Station: normal Patient leans: Backward and N/A  Psychiatric Specialty Exam: ROS  There were no vitals taken for this visit.There is no height or weight on file to calculate BMI.  General Appearance: Casual  Eye Contact:  Fair  Speech:  Slurred  Volume:  Normal  Mood:  Dysphoric  Affect:  Appropriate  Thought Process:  Goal Directed  Orientation:  Full (Time, Place, and Person)  Thought Content:  Logical  Suicidal Thoughts:  No  Homicidal Thoughts:  No  Memory:  Negative  Judgement:  Fair  Insight:   Lacking  Psychomotor Activity:  Decreased  Concentration:    Recall:  [employer] of Knowledge:Fair  Language: Fair  Akathisia:  No  Handed:    AIMS (if indicated):    Assets:  Desire for Improvement  ADL's:  Intact  Cognition: WNL  Sleep:      Treatment Plan Summary:  This patient's past diagnosis has been delusional disorder.  Mainly he was paranoid and suspiciousness.  No other delusions and no other evidence of psychosis.  At this time we will go ahead and discontinue her 2 mg of Abilify.  I doubt is helpful in anyway and it may be causing some problems.  I am I am able to perform an aims scale.  Her boyfriend is very supportive and I am sure if something happens or changes she will contact us.  For now she will not be on any psychotropic medicines.  We will attempt to reevaluate her in 2 to 3 months.  She is not suicidal and she is not dangerous to herself or to anyone else.

## 2021-12-20 ENCOUNTER — Other Ambulatory Visit: Payer: Self-pay | Admitting: Emergency Medicine

## 2021-12-27 ENCOUNTER — Ambulatory Visit: Payer: Medicare HMO | Admitting: Neurology

## 2022-01-08 ENCOUNTER — Other Ambulatory Visit: Payer: Self-pay | Admitting: Neurology

## 2022-01-08 NOTE — Telephone Encounter (Signed)
Rx refilled.

## 2022-01-09 ENCOUNTER — Other Ambulatory Visit: Payer: Self-pay

## 2022-01-09 NOTE — Patient Outreach (Signed)
[place] [facility]) Care Management  01/09/2022  Brandi Gomez [birth year] [id number]  CMA made outreach call to patient on today as [place], RN is out of the office. CMA left confidential voicemail for patient to return call as appointment will be cancelled.  [name] [facility] Management Assistant [phone]

## 2022-01-10 ENCOUNTER — Other Ambulatory Visit: Payer: Self-pay

## 2022-01-10 NOTE — Patient Outreach (Signed)
[place] [facility]) Care Management  01/10/2022  Brandi Gomez 11-14-1971 [id number]   CMA made second telephone outreach attempt to cancel patients upcoming appointment with [place], RN for 01/11/22. Patient did not answer after multiple rings. CMA left confidential voicemail for patient to return call.  [name] [facility] Management Assistant [phone]

## 2022-01-11 ENCOUNTER — Ambulatory Visit: Payer: Self-pay

## 2022-01-17 ENCOUNTER — Ambulatory Visit
Admission: RE | Admit: 2022-01-17 | Discharge: 2022-01-17 | Disposition: A | Discharge status: Home / Self Care | Payer: Medicare HMO | Source: Ambulatory Visit | Attending: Registered Nurse | Admitting: Registered Nurse

## 2022-01-17 ENCOUNTER — Other Ambulatory Visit: Payer: Self-pay

## 2022-01-17 ENCOUNTER — Other Ambulatory Visit: Payer: Self-pay | Admitting: Registered Nurse

## 2022-01-17 DIAGNOSIS — I70202 Unspecified atherosclerosis of native arteries of extremities, left leg: Secondary | ICD-10-CM

## 2022-01-29 ENCOUNTER — Other Ambulatory Visit: Payer: Self-pay | Admitting: Emergency Medicine

## 2022-02-01 ENCOUNTER — Other Ambulatory Visit: Payer: Self-pay | Admitting: Emergency Medicine

## 2022-02-15 ENCOUNTER — Other Ambulatory Visit: Payer: Self-pay

## 2022-02-15 ENCOUNTER — Ambulatory Visit (HOSPITAL_COMMUNITY)
Admission: EM | Admit: 2022-02-15 | Discharge: 2022-02-15 | Disposition: A | Discharge status: Home / Self Care | Payer: Medicare HMO | Attending: Family Medicine | Admitting: Family Medicine

## 2022-02-15 ENCOUNTER — Encounter (HOSPITAL_COMMUNITY): Payer: Self-pay

## 2022-02-15 DIAGNOSIS — H6983 Other specified disorders of Eustachian tube, bilateral: Secondary | ICD-10-CM

## 2022-02-15 MED ORDER — CETIRIZINE HCL 10 MG PO TABS: 10.0000 mg | ORAL_TABLET | Freq: Every day | ORAL | 0 refills | Status: DC

## 2022-02-15 NOTE — ED Provider Notes (Signed)
[place]    CSN: [id number] Arrival date & time: 02/15/22  1323      History   Chief Complaint Chief Complaint  Patient presents with   possible etd    bilateral    HPI Brandi Gomez is a 50 y.o. female.   Patient is here for 2 days of "ocean sound" in her ears.  No pain or pressure.  Decreased hearing, but that is normal for her.  No runny nose, congestion or drainage.  No otc med used.   Past Medical History:  Diagnosis Date   Anemia    Anxiety    Bipolar 1 disorder ([place])    Common migraine 05/19/2015   Depression    Diabetes mellitus, type II ([place])    Hypertension    Irritable bowel syndrome (IBS)    Mild mental retardation    Obesity    Partial complex seizure disorder with intractable epilepsy ([place]) 05/12/2014   Seizures ([place])    intractable, sz 08/23/17   Sleep apnea    Stroke ([place])    Type II or unspecified type diabetes mellitus without mention of complication, not stated as uncontrolled     Patient Active Problem List   Diagnosis Date Noted   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, unspecified cerebrovascular disease type ([place]) 01/18/2021   COVID-19 10/31/2020   Morbid (severe) obesity due to excess calories ([place]) 04/18/2020   HLD (hyperlipidemia) 10/12/2019   CKD (chronic kidney disease) stage 3, GFR 30-59 ml/min (HCC) 10/12/2019   Chronic combined systolic and diastolic heart failure ([place])    Depression 07/14/2019   Bipolar disorder ([place]) 05/13/2019   Bipolar 1 disorder ([place]) 05/13/2019   Adjustment disorder with anxiety    History of recent stroke 08/06/2017   OSA (obstructive sleep apnea)    Controlled type 2 diabetes mellitus without complication, without long-term current use of insulin ([place]) 05/08/2017   Hypertension associated with diabetes ([place]) 07/27/2008   Seizure disorder ([place]) 02/05/2007    Past Surgical History:  Procedure Laterality Date   BUBBLE STUDY   11/15/2019   Procedure: BUBBLE STUDY;  Surgeon: Dorothy Spark, MD;  Location: [place];  Service: Cardiovascular;;   COLONOSCOPY     2012-normal , Dr Sharlett Iles   ESOPHAGOGASTRODUODENOSCOPY     normal-Dr Patterson 2012   LOOP RECORDER INSERTION N/A 05/30/2017   Procedure: Loop Recorder Insertion;  Surgeon: Constance Haw, MD;  Location: [place] CV LAB;  Service: Cardiovascular;  Laterality: N/A;   LOOP RECORDER REMOVAL N/A 03/04/2018   Procedure: LOOP RECORDER REMOVAL;  Surgeon: Constance Haw, MD;  Location: [place] CV LAB;  Service: Cardiovascular;  Laterality: N/A;   MYRINGOTOMY WITH TUBE PLACEMENT     MYRINGOTOMY WITH TUBE PLACEMENT Right 11/05/2017   Procedure: MYRINGOTOMY WITH TUBE PLACEMENT;  Surgeon: Melissa Montane, MD;  Location: [place];  Service: ENT;  Laterality: Right;  right T Tube placement   NASAL SINUS SURGERY     TEE WITHOUT CARDIOVERSION N/A 05/30/2017   Procedure: TRANSESOPHAGEAL ECHOCARDIOGRAM (TEE);  Surgeon: Acie Fredrickson Wonda Cheng, MD;  Location: [place];  Service: Cardiovascular;  Laterality: N/A;   TEE WITHOUT CARDIOVERSION N/A 11/15/2019   Procedure: TRANSESOPHAGEAL ECHOCARDIOGRAM (TEE);  Surgeon: Dorothy Spark, MD;  Location: [facility] ENDOSCOPY;  Service: Cardiovascular;  Laterality: N/A;    OB History   No obstetric history on file.      Home Medications    Prior to Admission medications   Medication  Sig Start Date End Date Taking? Authorizing Provider  lamoTRIgine (LAMICTAL) 25 MG tablet TAKE 2 TABLETS (50 MG TOTAL) BY MOUTH AT BEDTIME.TOTAL DAILY DOSE OF LAMOTRIGINE '200MG'$  AM & '250MG'$  PM 01/08/22   [name], NP  acetaminophen (TYLENOL) 325 MG tablet Take 2 tablets (650 mg total) by mouth every 4 (four) hours as needed for mild pain (or temp > 37.5 C (99.5 F)). 10/19/19   Angiulli, Lavon Paganini, PA-C  ARIPiprazole (ABILIFY) 5 MG tablet Take 5 mg by mouth every morning. 07/04/21   [provider]  atorvastatin (LIPITOR) 40 MG tablet  TAKE 1 TABLET (40 MG TOTAL) BY MOUTH DAILY AT 6 PM FOR CHOLESTEROL 09/24/21   Sagardia, Ines Bloomer, MD  budesonide-formoterol [facility]) 160-4.5 MCG/ACT inhaler Inhale 2 puffs into the lungs 2 (two) times daily. Rinse mouth with water after each use 09/11/21   Volney American, PA-C  carvedilol (COREG) 6.25 MG tablet Take 6.25 mg by mouth 2 (two) times daily. 05/20/21   [provider]  cetirizine (ZYRTEC ALLERGY) 10 MG tablet Take 1 tablet (10 mg total) by mouth daily. 09/11/21   Volney American, PA-C  Dulaglutide (TRULICITY) 1.5 TW/6.5KC SOPN Inject 1.5 mg into the skin once a week. 01/18/21   Horald Pollen, MD  hydrALAZINE (APRESOLINE) 10 MG tablet Take 1 tablet (10 mg total) by mouth 2 (two) times daily. 01/04/20 07/31/21  Horald Pollen, MD  isosorbide dinitrate (ISORDIL) 10 MG tablet Take 1 tablet (10 mg total) by mouth 2 (two) times daily. 01/04/20 07/31/21  Horald Pollen, MD  JANUVIA 50 MG tablet TAKE 1 TABLET BY MOUTH EVERY DAY 09/14/21   Horald Pollen, MD  lamoTRIgine (LAMICTAL) 200 MG tablet TAKE 1 TABLET BY MOUTH TWICE A DAY 06/19/21   [name], NP  metroNIDAZOLE (FLAGYL) 500 MG tablet Take 1 tablet (500 mg total) by mouth 2 (two) times daily. 09/13/21   Chase Picket, MD  promethazine-dextromethorphan (PROMETHAZINE-DM) 6.25-15 MG/5ML syrup Take 5 mLs by mouth 4 (four) times daily as needed for cough. 08/27/21   Chase Picket, MD  promethazine-dextromethorphan (PROMETHAZINE-DM) 6.25-15 MG/5ML syrup Take 5 mLs by mouth 4 (four) times daily as needed for cough. 09/11/21   Volney American, PA-C  torsemide (DEMADEX) 10 MG tablet Take 10 mg by mouth daily.    [provider]  zonisamide (ZONEGRAN) 25 MG capsule TAKE 2 CAPSULES TWICE A DAY 10/29/21   Sater, Nanine Means, MD  methylphenidate (RITALIN) 5 MG tablet Take 1 tablet (5 mg total) by mouth 2 (two) times daily. Patient not taking: Reported on 12/29/2019 10/29/19 01/01/20   Izora Ribas, MD  traZODone (DESYREL) 100 MG tablet Take 1 tablet (100 mg total) by mouth at bedtime. Patient not taking: Reported on 12/29/2019 10/19/19 01/01/20  Angiulli, Lavon Paganini, PA-C    Family History Family History  Problem Relation Age of Onset   Diabetes Mother        passed away from accidental death   Hypertension Mother    Bipolar disorder Father    Diabetes Daughter    Leukemia Daughter    Ovarian cancer Maternal Aunt     Social History Social History   Tobacco Use   Smoking status: Never   Smokeless tobacco: Never  Vaping Use   Vaping Use: Never used  Substance Use Topics   Alcohol use: No   Drug use: No     Allergies   Amoxicillin, Lisinopril, Hydrocodone, and Tegretol [carbamazepine]  Review of Systems Review of Systems  Constitutional: Negative.   HENT:  Positive for hearing loss. Negative for congestion, ear discharge, ear pain, rhinorrhea and sore throat.   Respiratory: Negative.    Cardiovascular: Negative.   Gastrointestinal: Negative.     Physical Exam Triage Vital Signs ED Triage Vitals  Enc Vitals Group     BP 02/15/22 1427 (!) 144/85     Pulse Rate 02/15/22 1427 85     Resp 02/15/22 1427 20     Temp 02/15/22 1427 98.4 F (36.9 C)     Temp Source 02/15/22 1427 Oral     SpO2 02/15/22 1427 95 %     Weight --      Height --      Head Circumference --      Peak Flow --      Pain Score 02/15/22 1423 0     Pain Loc --      Pain Edu? --      Excl. in [place]? --    No data found.  Updated Vital Signs BP (!) 144/85 (BP Location: Left Arm)    Pulse 85    Temp 98.4 F (36.9 C) (Oral)    Resp 20    LMP  (LMP Unknown)    SpO2 95%   Visual Acuity Right Eye Distance:   Left Eye Distance:   Bilateral Distance:    Right Eye Near:   Left Eye Near:    Bilateral Near:     Physical Exam Constitutional:      Appearance: Normal appearance.  HENT:     Right Ear: A middle ear effusion is present.     Left Ear: A middle ear effusion  is present.     Mouth/Throat:     Mouth: Mucous membranes are moist.  Cardiovascular:     Rate and Rhythm: Normal rate and regular rhythm.  Pulmonary:     Effort: Pulmonary effort is normal.     Breath sounds: Normal breath sounds.  Musculoskeletal:     Cervical back: Normal range of motion.  Neurological:     Mental Status: She is alert.     UC Treatments / Results  Labs (all labs ordered are listed, but only abnormal results are displayed) Labs Reviewed - No data to display  EKG   Radiology No results found.  Procedures Procedures (including critical care time)  Medications Ordered in UC Medications - No data to display  Initial Impression / Assessment and Plan / UC Course  I have reviewed the triage vital signs and the nursing notes.  Pertinent labs & imaging results that were available during my care of the patient were reviewed by me and considered in my medical decision making (see chart for details).    Final Clinical Impressions(s) / UC Diagnoses   Final diagnoses:  Dysfunction of both eustachian tubes     Discharge Instructions      You were seen today for issues with your ears.  You likely have fluid behind the ear drums causing your symptoms.  I have sent out zyrtec '10mg'$  daily to take for several weeks.   If you continue to have problems or issues then please return for further evaluation.     ED Prescriptions     Medication Sig Dispense Auth. Provider   cetirizine (ZYRTEC ALLERGY) 10 MG tablet Take 1 tablet (10 mg total) by mouth daily. 30 tablet Rondel Oh, MD      PDMP not reviewed this  encounter.   Rondel Oh, MD 02/15/22 [phone]

## 2022-02-15 NOTE — Discharge Instructions (Signed)
You were seen today for issues with your ears.  You likely have fluid behind the ear drums causing your symptoms.  I have sent out zyrtec '10mg'$  daily to take for several weeks.   If you continue to have problems or issues then please return for further evaluation.  ?

## 2022-02-15 NOTE — ED Triage Notes (Signed)
2 day h/o "ocean sound" in bilateral ears.  Right sxs> left. No meds taken. Denies cough, congestion and runny nose. No ear pain. ?

## 2022-02-16 ENCOUNTER — Other Ambulatory Visit: Payer: Self-pay | Admitting: Neurology

## 2022-02-19 ENCOUNTER — Other Ambulatory Visit: Payer: Self-pay

## 2022-02-19 ENCOUNTER — Telehealth (HOSPITAL_BASED_OUTPATIENT_CLINIC_OR_DEPARTMENT_OTHER): Payer: Medicare HMO | Admitting: Psychiatry

## 2022-02-19 DIAGNOSIS — F22 Delusional disorders: Secondary | ICD-10-CM | POA: Diagnosis not present

## 2022-02-19 NOTE — Progress Notes (Addendum)
Psychiatric Initial Adult Assessment   Patient Identification: Brandi Gomez MRN:  [id number] Date of Evaluation:  02/19/2022 Referral Source: Dr. Hayden Pedro Chief Complaint:  Suspiciousness/paranoia Visit Diagnosis: Mood disorder secondary to CVA  Today the patient seems to be at her baseline.  Unfortunately Brandi Gomez her boyfriend is not there.  He is fishing at this time as he was on her last visit.  The patient now has been off Abilify for months and she says she is doing well.  She has no complaints.  Her biggest complaint however is that she is morbidly obese and cannot get around.  The patient has a primary care doctor appointment in the next few weeks and we will discuss this issue with them..  The patient drinks no alcohol and uses no drugs.  She denies any depression or anxiety at this time.  Importantly she denies hallucinations or any delusional material.  At this time the patient takes no psychotropic medications.  She has a very sedentary lifestyle watches a lot of television.  It is noted she has 3 daughters and 3 grandchildren.  They she says are doing pretty well.  Brandi Gomez is stable.  Virtual Visit via Telephone Note  I connected with Brandi Gomez on 08/26/23 at  3:30 PM EDT by telephone and verified that I am speaking with the correct person using two identifiers.  Location: Patient: home Provider: office   I discussed the limitations, risks, security and privacy concerns of performing an evaluation and management service by telephone and the availability of in person appointments. I also discussed with the patient that there may be a patient responsible charge related to this service. The patient expressed understanding and agreed to proceed.     I discussed the assessment and treatment plan with the patient. The patient was provided an opportunity to ask questions and all were answered. The patient agreed with the plan and demonstrated an understanding of the  instructions.   The patient was advised to call back or seek an in-person evaluation if the symptoms worsen or if the condition fails to improve as anticipated.  I provided 30 minutes of non-face-to-face time during this encounter.   Brandi Balsam, MD   (Hypo) Manic Symptoms:   Anxiety Symptoms:   Psychotic Symptoms:   PTSD Symptoms:   Past Psychiatric History: Cymbalta 30 mg Previous Psychotropic Medications: Cymbalta 30 mg  Substance Abuse History in the last 12 months:  No.  Consequences of Substance Abuse:   Past Medical History:  Past Medical History:  Diagnosis Date   Anemia    Anxiety    Bipolar 1 disorder (HCC)    Common migraine 05/19/2015   Depression    Diabetes mellitus, type II (HCC)    Hypertension    Irritable bowel syndrome (IBS)    Mild mental retardation    Obesity    Partial complex seizure disorder with intractable epilepsy (HCC) 05/12/2014   Seizures (HCC)    intractable, sz 08/23/17   Sleep apnea    Stroke (HCC)    Type II or unspecified type diabetes mellitus without mention of complication, not stated as uncontrolled     Past Surgical History:  Procedure Laterality Date   BUBBLE STUDY  11/15/2019   Procedure: BUBBLE STUDY;  Surgeon: Brandi Masson, MD;  Location: [facility] ENDOSCOPY;  Service: Cardiovascular;;   COLONOSCOPY     2012-normal , Dr Brandi Gomez   ESOPHAGOGASTRODUODENOSCOPY     normal-Dr Brandi Gomez 2012   LOOP RECORDER INSERTION  N/A 05/30/2017   Procedure: Loop Recorder Insertion;  Surgeon: Brandi Lemming, MD;  Location: [facility] INVASIVE CV LAB;  Service: Cardiovascular;  Laterality: N/A;   LOOP RECORDER REMOVAL N/A 03/04/2018   Procedure: LOOP RECORDER REMOVAL;  Surgeon: Brandi Lemming, MD;  Location: [facility] INVASIVE CV LAB;  Service: Cardiovascular;  Laterality: N/A;   MYRINGOTOMY WITH TUBE PLACEMENT     MYRINGOTOMY WITH TUBE PLACEMENT Right 11/05/2017   Procedure: MYRINGOTOMY WITH TUBE PLACEMENT;  Surgeon: Brandi Obey, MD;   Location: [facility] OR;  Service: ENT;  Laterality: Right;  right T Tube placement   NASAL SINUS SURGERY     TEE WITHOUT CARDIOVERSION N/A 05/30/2017   Procedure: TRANSESOPHAGEAL ECHOCARDIOGRAM (TEE);  Surgeon: Brandi Hashimoto Deloris Ping, MD;  Location: [facility] ENDOSCOPY;  Service: Cardiovascular;  Laterality: N/A;   TEE WITHOUT CARDIOVERSION N/A 11/15/2019   Procedure: TRANSESOPHAGEAL ECHOCARDIOGRAM (TEE);  Surgeon: Brandi Masson, MD;  Location: [facility] ENDOSCOPY;  Service: Cardiovascular;  Laterality: N/A;    Family Psychiatric History:   Family History:  Family History  Problem Relation Age of Onset   Diabetes Mother        passed away from accidental death   Hypertension Mother    Bipolar disorder Father    Diabetes Daughter    Leukemia Daughter    Ovarian cancer Maternal Aunt     Social History:   Social History   Socioeconomic History   Marital status: Single    Spouse name: Brandi Gomez   Number of children: 3   Years of education: 12   Highest education level: Not on file  Occupational History   Occupation: disabled    Employer: DISABLED  Tobacco Use   Smoking status: Never   Smokeless tobacco: Never  Vaping Use   Vaping Use: Never used  Substance and Sexual Activity   Alcohol use: No   Drug use: No   Sexual activity: Never  Other Topics Concern   Not on file  Social History Narrative   Patient lives at home with daughter.    Patient has 3 children.    Patient is right handed.    Patient has a high school education.    Patient is on disability   Patient drinks 2 cups of caffeine daily.   Social Determinants of Health   Financial Resource Strain: Not on file  Food Insecurity: Not on file  Transportation Needs: Not on file  Physical Activity: Not on file  Stress: Not on file  Social Connections: Not on file    Additional Social History:   Allergies:   Allergies  Allergen Reactions   Amoxicillin Itching    Did it involve swelling of the face/tongue/throat, SOB, or low BP?  No Did it involve sudden or severe rash/hives, skin peeling, or any reaction on the inside of your mouth or nose? No Did you need to seek medical attention at a hospital or doctor's office? No When did it last happen?      within the past 10 years If all above answers are "NO", may proceed with cephalosporin use.    Lisinopril Swelling    Angioedema    Hydrocodone Other (See Comments)    Depressed    Tegretol [Carbamazepine] Swelling    Throat swells    Metabolic Disorder Labs: Lab Results  Component Value Date   HGBA1C 7.9 (A) 01/18/2021   MPG 180.03 10/31/2020   MPG 139.85 10/12/2019   No results found for: PROLACTIN Lab Results  Component Value Date  CHOL 108 04/18/2020   TRIG 100 10/31/2020   HDL 52 04/18/2020   CHOLHDL 2.1 04/18/2020   VLDL 10 10/12/2019   LDLCALC 44 04/18/2020   LDLCALC 64 10/12/2019     Current Medications: Current Outpatient Medications  Medication Sig Dispense Refill   lamoTRIgine (LAMICTAL) 25 MG tablet TAKE 2 TABLETS (50 MG TOTAL) BY MOUTH AT BEDTIME.TOTAL DAILY DOSE OF LAMOTRIGINE 200MG  AM & 250MG  PM 60 tablet 0   acetaminophen (TYLENOL) 325 MG tablet Take 2 tablets (650 mg total) by mouth every 4 (four) hours as needed for mild pain (or temp > 37.5 C (99.5 F)).     atorvastatin (LIPITOR) 40 MG tablet TAKE 1 TABLET (40 MG TOTAL) BY MOUTH DAILY AT 6 PM FOR CHOLESTEROL 90 tablet 0   budesonide-formoterol (SYMBICORT) 160-4.5 MCG/ACT inhaler Inhale 2 puffs into the lungs 2 (two) times daily. Rinse mouth with water after each use 1 each 0   carvedilol (COREG) 6.25 MG tablet Take 6.25 mg by mouth 2 (two) times daily.     cetirizine (ZYRTEC ALLERGY) 10 MG tablet Take 1 tablet (10 mg total) by mouth daily. 30 tablet 0   Dulaglutide (TRULICITY) 1.5 MG/0.5ML SOPN Inject 1.5 mg into the skin once a week. 6 mL 5   hydrALAZINE (APRESOLINE) 10 MG tablet Take 1 tablet (10 mg total) by mouth 2 (two) times daily. 180 tablet 3   isosorbide dinitrate (ISORDIL)  10 MG tablet Take 1 tablet (10 mg total) by mouth 2 (two) times daily. 180 tablet 3   JANUVIA 50 MG tablet TAKE 1 TABLET BY MOUTH EVERY DAY 90 tablet 3   lamoTRIgine (LAMICTAL) 200 MG tablet TAKE 1 TABLET BY MOUTH TWICE A DAY 180 tablet 3   metroNIDAZOLE (FLAGYL) 500 MG tablet Take 1 tablet (500 mg total) by mouth 2 (two) times daily. 14 tablet 0   torsemide (DEMADEX) 10 MG tablet Take 10 mg by mouth daily.     zonisamide (ZONEGRAN) 25 MG capsule TAKE 2 CAPSULES TWICE A DAY 360 capsule 1   No current facility-administered medications for this visit.    Neurologic: Headache: No Seizure: Yes Paresthesias:No  Musculoskeletal: Strength & Muscle Tone: within normal limits Gait & Station: normal Patient leans: Backward and N/A  Psychiatric Specialty Exam: ROS  There were no vitals taken for this visit.There is no height or weight on file to calculate BMI.  General Appearance: Casual  Eye Contact:  Fair  Speech:  Slurred  Volume:  Normal  Mood:  Dysphoric  Affect:  Appropriate  Thought Process:  Goal Directed  Orientation:  Full (Time, Place, and Person)  Thought Content:  Logical  Suicidal Thoughts:  No  Homicidal Thoughts:  No  Memory:  Negative  Judgement:  Fair  Insight:  Lacking  Psychomotor Activity:  Decreased  Concentration:    Recall:  Fair  Fund of Knowledge:Fair  Language: Fair  Akathisia:  No  Handed:    AIMS (if indicated):    Assets:  Desire for Improvement  ADL's:  Intact  Cognition: WNL  Sleep:      Treatment Plan Summary:   This patient's diagnosis in the past was delusional disorder.  At this time it seems to have remitted.  She is off all antipsychotic medications and is doing fairly well.  She has no evidence of psychosis.  At this time we will end our care.  I have asked her and/or her boyfriend Brandi Gomez to call us back if there are any new problems  and we will be glad to see them back.  They will not receive a follow-up call for an appointment.

## 2022-02-22 ENCOUNTER — Other Ambulatory Visit: Payer: Self-pay

## 2022-02-22 NOTE — Patient Outreach (Signed)
[place] [facility]) Care Management ? ?02/22/2022 ? ?Brandi Gomez ?02-07-1971 ?[id number] ? ? ?Telephone call to patient for disease management follow up.   No answer.  HIPAA compliant voice message left.   ? ?Plan: If no return call, RN CM will attempt patient again in May.  ? ?[name], RN, MSN ?[facility] Care Management ?Care Management Coordinator ?Direct Line [phone] ?Toll Free: [phone]  ?Fax: [phone] ? ?

## 2022-02-24 ENCOUNTER — Other Ambulatory Visit: Payer: Self-pay | Admitting: Psychiatry

## 2022-02-24 MED ORDER — LAMOTRIGINE 25 MG PO TABS
ORAL_TABLET | ORAL | 0 refills | Status: DC
Start: 2022-02-24 — End: 2022-03-21

## 2022-02-24 MED ORDER — LAMOTRIGINE 200 MG PO TABS: 200.0000 mg | ORAL_TABLET | Freq: Two times a day (BID) | ORAL | 3 refills | Status: DC

## 2022-02-24 MED ORDER — ZONISAMIDE 25 MG PO CAPS: ORAL_CAPSULE | ORAL | 1 refills | Status: DC

## 2022-03-19 ENCOUNTER — Other Ambulatory Visit: Payer: Self-pay | Admitting: Psychiatry

## 2022-03-21 ENCOUNTER — Other Ambulatory Visit: Payer: Self-pay | Admitting: Psychiatry

## 2022-04-12 ENCOUNTER — Other Ambulatory Visit: Payer: Self-pay | Admitting: Psychiatry

## 2022-04-19 ENCOUNTER — Other Ambulatory Visit: Payer: Self-pay

## 2022-04-19 NOTE — Patient Outreach (Signed)
[place] [facility]) Care Management ? ?04/19/2022 ? ?Brandi Gomez ?1971/09/08 ?[id number] ? ? ?Telephone call to patient for disease management follow up.   No answer.  HIPAA compliant voice message left.   ? ?Plan: If no return call, RN CM will attempt patient again in August.   ? ?[name], RN, MSN ?[facility] Care Management ?Care Management Coordinator ?Direct Line [phone] ?Toll Free: [phone]  ?Fax: [phone] ? ?

## 2022-05-09 ENCOUNTER — Telehealth: Payer: Self-pay

## 2022-05-09 ENCOUNTER — Telehealth: Payer: Self-pay | Admitting: *Deleted

## 2022-05-09 NOTE — Telephone Encounter (Signed)
   Pre-operative Risk Assessment    Patient Name: Brandi Gomez  DOB: 1971-09-24 MRN: [id number]      Request for Surgical Clearance    Procedure:   Colonoscopy  with Propofol  Date of Surgery:  Clearance TBD                                 Surgeon:  Dr. Ronnette Juniper  Surgeon's Group or Practice Name:  [facility]  Phone number:  [phone] Fax number:  [phone]  Type of Clearance Requested:   - Medical    Type of Anesthesia:  Not Indicated   Additional requests/questions:    Signed, Zebedee Iba   05/09/2022, 11:19 AM

## 2022-05-09 NOTE — Telephone Encounter (Signed)
   Name: Brandi Gomez  DOB: 02-13-1971  MRN: [id number]  Primary Cardiologist: Skeet Latch, MD  Chart reviewed as part of pre-operative protocol coverage. Because of Brandi Gomez's past medical history and time since last visit, she will require a follow-up in-office visit in order to better assess preoperative cardiovascular risk.  Pre-op covering staff: - Please schedule appointment and call patient to inform them. If patient already had an upcoming appointment within acceptable timeframe, please add "pre-op clearance" to the appointment notes so provider is aware. - Please contact requesting surgeon's office via preferred method (i.e, phone, fax) to inform them of need for appointment prior to surgery.  [place], [place]  05/09/2022, 11:38 PM

## 2022-05-09 NOTE — Telephone Encounter (Signed)
Received surgical clearance form from [facility] re: colonoscopy at hospital on propofol.  Faxed form back advising [facility] the patient was last seen 07/31/21 by Dr Jannifer Franklin, retired now. She does not have follow up scheduled. Per Dr Jannifer Franklin' note she was to follow with Dr April Manson in our office.

## 2022-05-10 NOTE — Telephone Encounter (Signed)
Pt has been scheduled to see Terie Purser, NP, 05/17/22, clearance will be addressed at that time.  Will route back to the requesting surgeons' office to make them aware.

## 2022-05-12 ENCOUNTER — Other Ambulatory Visit: Payer: Self-pay | Admitting: Family Medicine

## 2022-05-13 NOTE — Telephone Encounter (Signed)
Requested Prescriptions  Pending Prescriptions Disp Refills  . cetirizine (ZYRTEC) 10 MG tablet [Pharmacy Med Name: CETIRIZINE HCL 10 MG TABLET] 90 tablet     Sig: TAKE 1 TABLET BY MOUTH EVERY DAY     Ear, Nose, and Throat:  Antihistamines 2 Failed - 05/12/2022  1:36 AM      Failed - Cr in normal range and within 360 days    Creat  Date Value Ref Range Status  01/30/2015 0.72 0.50 - 1.10 mg/dL Final   Creatinine, Ser  Date Value Ref Range Status  07/30/2021 1.19 (H) 0.44 - 1.00 mg/dL Final         Failed - Valid encounter within last 12 months    Recent Outpatient Visits          1 year ago Urinary frequency   [facility] at [place], Ines Bloomer, MD   1 year ago Hospital discharge follow-up   [facility] at [place], Ines Bloomer, MD   1 year ago Hypertension associated with diabetes [facility])   Primary Care at [place], Ines Bloomer, MD   2 years ago Hypertension associated with diabetes [facility])   Primary Care at [place], Ines Bloomer, MD   2 years ago Hypertension associated with diabetes [facility])   Primary Care at [facility], Ines Bloomer, MD      Future Appointments            In 4 days Loel Dubonnet, NP [facility]

## 2022-05-16 ENCOUNTER — Other Ambulatory Visit: Payer: Self-pay

## 2022-05-16 NOTE — Patient Outreach (Signed)
[place] [facility]) Care Management  05/16/2022  Brandi Gomez June 22, 1971 [id number]   Multiple attempts to contact patient without success. No response from letter mailed to patient.   Plan: RN CM will close case at this time.   [name], RN, MSN [facility] Care Management Care Management Coordinator Direct Line [phone] Toll Free: [phone]  Fax: [phone]

## 2022-05-17 ENCOUNTER — Ambulatory Visit (HOSPITAL_BASED_OUTPATIENT_CLINIC_OR_DEPARTMENT_OTHER): Payer: Medicare HMO | Admitting: Family

## 2022-07-12 ENCOUNTER — Ambulatory Visit: Payer: Self-pay

## 2022-07-21 ENCOUNTER — Other Ambulatory Visit: Payer: Self-pay | Admitting: Psychiatry

## 2022-07-24 ENCOUNTER — Telehealth: Payer: Self-pay | Admitting: Psychiatry

## 2022-07-24 NOTE — Telephone Encounter (Signed)
Pt is calling and requesting a refill on lamoTRIgine (LAMICTAL) 25 MG tablet.  Pt is requesting prescription be sent to [id number] [place]. Pt made appointment for 8/28 @ 8:45am

## 2022-07-24 NOTE — Telephone Encounter (Signed)
Pt was given 60tab with 3 refills, 4 months worth in May. Should have enough until visit is completed.

## 2022-07-25 NOTE — Telephone Encounter (Signed)
Contacted pt, informed her was given 60tab with 3 refills, 4 months worth in May. Should have enough until visit is completed. . She stated she will contact pharmacy

## 2022-07-25 NOTE — Telephone Encounter (Signed)
Pt called wanting RN to call her back to discuss lamoTRIgine (LAMICTAL) 25 MG tablet

## 2022-08-04 NOTE — Progress Notes (Unsigned)
Patient: Brandi Gomez Date of Birth: 1971-01-10  Reason for Visit: Follow up History from: Patient Primary Neurologist: Dr. April Manson seizures/Dr. Athar-OSA  ASSESSMENT AND PLAN 51 y.o. year old female   1.  Seizures -Last seizure was in August 2022 -Check routine lab, continue current seizure medications Meds ordered this encounter  Medications   zonisamide (ZONEGRAN) 50 MG capsule    Sig: Take 1 capsule (50 mg total) by mouth in the morning and at bedtime.    Dispense:  180 capsule    Refill:  3   lamoTRIgine (LAMICTAL) 25 MG tablet    Sig: Take 2 tablets at bedtime. Total daily dose of Lamictal 200 mg AM, 250 mg PM    Dispense:  60 tablet    Refill:  11    Pt needs appt for further refills.   lamoTRIgine (LAMICTAL) 200 MG tablet    Sig: Take 1 tablet (200 mg total) by mouth 2 (two) times daily.    Dispense:  180 tablet    Refill:  3   2.  OSA on CPAP -Very high leak, order mask refit, DME to provide education on CPAP -6-week telephone visit with me to pull a download and evaluate data, will do ESS -Prior telephone note in 2020 indicates, CPAP set up January 2020, insurance will cover a new machine every 5 years  HISTORY OF PRESENT ILLNESS: Today 08/05/22 Brandi Gomez is here today for follow-up.  Last saw Dr. Jannifer Gomez in August 2022.  She had had a recent seizure.  Zonegran was added to Lamictal. Last seizure was August 2022. Her daughter lives with her, manages her medicine.  Denies any side effects.  Is overdue to see her PCP.  Patient doesn't drive. Is on CPAP.  CPAP download shows excellent compliance 30 out of 30 at 100% greater than 4 hours usage.  Average usage 8 hours 59 minutes.  Minimum pressure 7, maximum pressure 12 cm water.  Leak is high at 54.7.  AHI 1.2.  Uses full facemask.   HISTORY  07/31/2021 Dr. Jannifer Gomez: [name] is a 51 year old right-handed black female with a history of bipolar disorder, diabetes, sleep apnea on CPAP, and intractable  seizures.  The patient has a history of a right posterior frontal stroke in the past.  The patient was seen in the emergency room yesterday with a seizure event.  She comes in today with a family member.  The patient apparently took a nap and had a seizure shortly thereafter, she does wear her CPAP during the naps.  The patient has not missed any of her lamotrigine.  She currently is taking 200 mg in the morning and 250 mg in the evening.  A lamotrigine level was checked through the emergency room but is still pending.  Urine drug screen was negative.  The patient has continued to gain weight over time, she is 5 feet 1 inches tall and weighs 317 pounds.  She comes here for further evaluation.  REVIEW OF SYSTEMS: Out of a complete 14 system review of symptoms, the patient complains only of the following symptoms, and all other reviewed systems are negative.  See HPI  ALLERGIES: Allergies  Allergen Reactions   Amoxicillin Itching    Did it involve swelling of the face/tongue/throat, SOB, or low BP? No Did it involve sudden or severe rash/hives, skin peeling, or any reaction on the inside of your mouth or nose? No Did you need to seek medical attention at a hospital or doctor's  office? No When did it last happen?      within the past 10 years If all above answers are "NO", may proceed with cephalosporin use.    Lisinopril Swelling    Angioedema    Hydrocodone Other (See Comments)    Depressed    Tegretol [Carbamazepine] Swelling    Throat swells    HOME MEDICATIONS: Outpatient Medications Prior to Visit  Medication Sig Dispense Refill   ACCU-CHEK GUIDE test strip      acetaminophen (TYLENOL) 325 MG tablet Take 2 tablets (650 mg total) by mouth every 4 (four) hours as needed for mild pain (or temp > 37.5 C (99.5 F)).     ARIPiprazole (ABILIFY) 5 MG tablet Take 5 mg by mouth daily.     atorvastatin (LIPITOR) 40 MG tablet TAKE 1 TABLET (40 MG TOTAL) BY MOUTH DAILY AT 6 PM FOR CHOLESTEROL 90  tablet 0   carvedilol (COREG) 6.25 MG tablet Take 6.25 mg by mouth 2 (two) times daily.     cetirizine (ZYRTEC ALLERGY) 10 MG tablet Take 1 tablet (10 mg total) by mouth daily. 30 tablet 0   ferrous sulfate 325 (65 FE) MG tablet Take 325 mg by mouth 2 (two) times daily.     folic acid (FOLVITE) 1 MG tablet Take 1 mg by mouth daily.     glipiZIDE (GLUCOTROL) 10 MG tablet Take 10 mg by mouth 2 (two) times daily.     metroNIDAZOLE (FLAGYL) 500 MG tablet Take 1 tablet (500 mg total) by mouth 2 (two) times daily. 14 tablet 0   torsemide (DEMADEX) 10 MG tablet Take 10 mg by mouth daily.     lamoTRIgine (LAMICTAL) 200 MG tablet Take 1 tablet (200 mg total) by mouth 2 (two) times daily. 180 tablet 3   lamoTRIgine (LAMICTAL) 25 MG tablet TAKE 2 TABLETS (50 MG TOTAL) BY MOUTH AT BEDTIME.TOTAL DAILY DOSE OF LAMOTRIGINE '200MG'$  AM & '250MG'$  PM 60 tablet 3   zonisamide (ZONEGRAN) 25 MG capsule TAKE 2 CAPSULES TWICE A DAY 360 capsule 1   hydrALAZINE (APRESOLINE) 10 MG tablet Take 1 tablet (10 mg total) by mouth 2 (two) times daily. 180 tablet 3   isosorbide dinitrate (ISORDIL) 10 MG tablet Take 1 tablet (10 mg total) by mouth 2 (two) times daily. 180 tablet 3   budesonide-formoterol (SYMBICORT) 160-4.5 MCG/ACT inhaler Inhale 2 puffs into the lungs 2 (two) times daily. Rinse mouth with water after each use 1 each 0   Dulaglutide (TRULICITY) 1.5 LD/3.5TS SOPN Inject 1.5 mg into the skin once a week. 6 mL 5   JANUVIA 50 MG tablet TAKE 1 TABLET BY MOUTH EVERY DAY 90 tablet 3   No facility-administered medications prior to visit.    PAST MEDICAL HISTORY: Past Medical History:  Diagnosis Date   Anemia    Anxiety    Bipolar 1 disorder ([place])    Common migraine 05/19/2015   Depression    Diabetes mellitus, type II ([place])    Hypertension    Irritable bowel syndrome (IBS)    Mild mental retardation    Obesity    Partial complex seizure disorder with intractable epilepsy ([place]) 05/12/2014   Seizures ([place])     intractable, sz 08/23/17   Sleep apnea    Stroke ([place])    Type II or unspecified type diabetes mellitus without mention of complication, not stated as uncontrolled     PAST SURGICAL HISTORY: Past Surgical History:  Procedure Laterality Date   BUBBLE STUDY  11/15/2019   Procedure: BUBBLE STUDY;  Surgeon: Dorothy Spark, MD;  Location: [facility];  Service: Cardiovascular;;   COLONOSCOPY     2012-normal , Dr Sharlett Iles   ESOPHAGOGASTRODUODENOSCOPY     normal-Dr Patterson 2012   LOOP RECORDER INSERTION N/A 05/30/2017   Procedure: Loop Recorder Insertion;  Surgeon: Constance Haw, MD;  Location: [facility];  Service: Cardiovascular;  Laterality: N/A;   LOOP RECORDER REMOVAL N/A 03/04/2018   Procedure: LOOP RECORDER REMOVAL;  Surgeon: Constance Haw, MD;  Location: [facility];  Service: Cardiovascular;  Laterality: N/A;   MYRINGOTOMY WITH TUBE PLACEMENT     MYRINGOTOMY WITH TUBE PLACEMENT Right 11/05/2017   Procedure: MYRINGOTOMY WITH TUBE PLACEMENT;  Surgeon: Melissa Montane, MD;  Location: [facility];  Service: ENT;  Laterality: Right;  right T Tube placement   NASAL SINUS SURGERY     TEE WITHOUT CARDIOVERSION N/A 05/30/2017   Procedure: TRANSESOPHAGEAL ECHOCARDIOGRAM (TEE);  Surgeon: Acie Fredrickson Wonda Cheng, MD;  Location: [facility];  Service: Cardiovascular;  Laterality: N/A;   TEE WITHOUT CARDIOVERSION N/A 11/15/2019   Procedure: TRANSESOPHAGEAL ECHOCARDIOGRAM (TEE);  Surgeon: Dorothy Spark, MD;  Location[facility];  Service: Cardiovascular;  Laterality: N/A;    FAMILY HISTORY: Family History  Problem Relation Age of Onset   Diabetes Mother        passed away from accidental death   Hypertension Mother    Bipolar disorder Father    Diabetes Daughter    Leukemia Daughter    Ovarian cancer Maternal Aunt     SOCIAL HISTORY: Social History   Socioeconomic History   Marital status: Single    Spouse name: [name]   Number of children: 3   Years of  education: 12   Highest education level: Not on file  Occupational History   Occupation: disabled    Employer: DISABLED  Tobacco Use   Smoking status: Never   Smokeless tobacco: Never  Vaping Use   Vaping Use: Never used  Substance and Sexual Activity   Alcohol use: No   Drug use: No   Sexual activity: Never  Other Topics Concern   Not on file  Social History Narrative   Patient lives at home with daughter.    Patient has 3 children.    Patient is right handed.    Patient has a high school education.    Patient is on disability   Patient drinks 2 cups of caffeine daily.   Social Determinants of Health   Financial Resource Strain: Not on file  Food Insecurity: No Food Insecurity (12/26/2020)   Hunger Vital Sign    Worried About Running Out of Food in the Last Year: Never true    Ran Out of Food in the Last Year: Never true  Transportation Needs: No Transportation Needs (11/06/2020)   PRAPARE - Hydrologist (Medical): No    Lack of Transportation (Non-Medical): No  Physical Activity: Not on file  Stress: Not on file  Social Connections: Not on file  Intimate Partner Violence: Not on file   PHYSICAL EXAM  Vitals:   08/05/22 0845  BP: (!) 149/79  Pulse: 75  Weight: 298 lb (135.2 kg)  Height: '5\' 1"'$  (1.549 m)   Body mass index is 56.31 kg/m.  Generalized: Well developed, in no acute distress, morbidly obese Neurological examination  Mentation: Alert oriented to time, place, history taking, but is somewhat poor historian follows all commands speech and language fluent  Cranial nerve II-XII: Pupils were equal round reactive to light. Extraocular movements were full, visual field were full on confrontational test. Facial sensation and strength were normal.  Head turning and shoulder shrug  were normal and symmetric. Motor: The motor testing reveals 5 over 5 strength of all 4 extremities. Good symmetric motor tone is noted throughout.  Sensory:  Sensory testing is intact to soft touch on all 4 extremities. No evidence of extinction is noted.  Coordination: Cerebellar testing reveals good finger-nose-finger and heel-to-shin bilaterally.  Gait and station: Gait is normal, but limited by large body habitus, is independent  DIAGNOSTIC DATA (LABS, IMAGING, TESTING) - I reviewed patient records, labs, notes, testing and imaging myself where available.  Lab Results  Component Value Date   WBC 9.7 07/30/2021   HGB 9.1 (L) 07/30/2021   HCT 29.9 (L) 07/30/2021   MCV 85.9 07/30/2021   PLT 336 07/30/2021      Component Value Date/Time   NA 136 07/30/2021 2357   NA 139 01/04/2021 1115   K 3.9 07/30/2021 2357   CL 103 07/30/2021 2357   CO2 25 07/30/2021 2357   GLUCOSE 233 (H) 07/30/2021 2357   BUN 17 07/30/2021 2357   BUN 15 01/04/2021 1115   CREATININE 1.19 (H) 07/30/2021 2357   CREATININE 0.72 01/30/2015 1241   CALCIUM 8.4 (L) 07/30/2021 2357   PROT 6.8 01/04/2021 1115   ALBUMIN 3.8 01/04/2021 1115   AST 10 01/04/2021 1115   ALT 12 01/04/2021 1115   ALKPHOS 115 01/04/2021 1115   BILITOT 0.3 01/04/2021 1115   GFRNONAA 55 (L) 07/30/2021 2357   GFRNONAA >89 01/30/2015 1241   GFRAA 56 (L) 01/04/2021 1115   GFRAA >89 01/30/2015 1241   Lab Results  Component Value Date   CHOL 108 04/18/2020   HDL 52 04/18/2020   LDLCALC 44 04/18/2020   TRIG 100 10/31/2020   CHOLHDL 2.1 04/18/2020   Lab Results  Component Value Date   HGBA1C 7.9 (A) 01/18/2021   Lab Results  Component Value Date   BULAGTXM46 803 05/13/2014   Lab Results  Component Value Date   TSH 1.220 02/10/2018    Butler Denmark, AGNP-C, DNP 08/05/2022, 9:15 AM [facility] [address], [place], [place] [address] [phone]

## 2022-08-05 ENCOUNTER — Encounter: Payer: Self-pay | Admitting: Neurology

## 2022-08-05 ENCOUNTER — Ambulatory Visit (INDEPENDENT_AMBULATORY_CARE_PROVIDER_SITE_OTHER): Payer: Medicare HMO | Admitting: Neurology

## 2022-08-05 VITALS — BP 149/79 | HR 75 | Ht 61.0 in | Wt 298.0 lb

## 2022-08-05 DIAGNOSIS — G4733 Obstructive sleep apnea (adult) (pediatric): Secondary | ICD-10-CM | POA: Diagnosis not present

## 2022-08-05 DIAGNOSIS — G40909 Epilepsy, unspecified, not intractable, without status epilepticus: Secondary | ICD-10-CM | POA: Diagnosis not present

## 2022-08-05 DIAGNOSIS — Z9989 Dependence on other enabling machines and devices: Secondary | ICD-10-CM

## 2022-08-05 MED ORDER — LAMOTRIGINE 25 MG PO TABS
ORAL_TABLET | ORAL | 11 refills | Status: DC
Start: 2022-08-05 — End: 2022-08-07

## 2022-08-05 MED ORDER — ZONISAMIDE 50 MG PO CAPS
50.0000 mg | ORAL_CAPSULE | Freq: Two times a day (BID) | ORAL | 3 refills | Status: DC
Start: 2022-08-05 — End: 2022-08-07

## 2022-08-05 MED ORDER — LAMOTRIGINE 200 MG PO TABS
200.0000 mg | ORAL_TABLET | Freq: Two times a day (BID) | ORAL | 3 refills | Status: DC
Start: 2022-08-05 — End: 2023-02-11

## 2022-08-05 NOTE — Patient Instructions (Signed)
I simplified your medication regimen below:  Meds ordered this encounter  Medications   zonisamide (ZONEGRAN) 50 MG capsule    Sig: Take 1 capsule (50 mg total) by mouth in the morning and at bedtime.    Dispense:  180 capsule    Refill:  3   lamoTRIgine (LAMICTAL) 25 MG tablet    Sig: Take 2 tablets at bedtime. Total daily dose of Lamictal 200 mg AM, 250 mg PM    Dispense:  60 tablet    Refill:  11    Pt needs appt for further refills.   lamoTRIgine (LAMICTAL) 200 MG tablet    Sig: Take 1 tablet (200 mg total) by mouth 2 (two) times daily.    Dispense:  180 tablet    Refill:  3   Continue to use CPAP, I ordered for mask refit and supplies

## 2022-08-06 LAB — CBC WITH DIFFERENTIAL/PLATELET
Basophils Absolute: 0 10*3/uL (ref 0.0–0.2)
Basos: 0 %
EOS (ABSOLUTE): 0.1 10*3/uL (ref 0.0–0.4)
Eos: 2 %
Hematocrit: 33.7 % — ABNORMAL LOW (ref 34.0–46.6)
Hemoglobin: 10.7 g/dL — ABNORMAL LOW (ref 11.1–15.9)
Immature Grans (Abs): 0 10*3/uL (ref 0.0–0.1)
Immature Granulocytes: 1 %
Lymphocytes Absolute: 2 10*3/uL (ref 0.7–3.1)
Lymphs: 23 %
MCH: 28.2 pg (ref 26.6–33.0)
MCHC: 31.8 g/dL (ref 31.5–35.7)
MCV: 89 fL (ref 79–97)
Monocytes Absolute: 0.5 10*3/uL (ref 0.1–0.9)
Monocytes: 6 %
Neutrophils Absolute: 5.8 10*3/uL (ref 1.4–7.0)
Neutrophils: 68 %
Platelets: 296 10*3/uL (ref 150–450)
RBC: 3.79 x10E6/uL (ref 3.77–5.28)
RDW: 13.1 % (ref 11.7–15.4)
WBC: 8.4 10*3/uL (ref 3.4–10.8)

## 2022-08-06 LAB — COMPREHENSIVE METABOLIC PANEL
ALT: 6 IU/L (ref 0–32)
AST: 10 IU/L (ref 0–40)
Albumin/Globulin Ratio: 1.2 (ref 1.2–2.2)
Albumin: 3.7 g/dL — ABNORMAL LOW (ref 3.8–4.9)
Alkaline Phosphatase: 111 IU/L (ref 44–121)
BUN/Creatinine Ratio: 10 (ref 9–23)
BUN: 15 mg/dL (ref 6–24)
Bilirubin Total: 0.3 mg/dL (ref 0.0–1.2)
CO2: 23 mmol/L (ref 20–29)
Calcium: 9 mg/dL (ref 8.7–10.2)
Chloride: 100 mmol/L (ref 96–106)
Creatinine, Ser: 1.47 mg/dL — ABNORMAL HIGH (ref 0.57–1.00)
Globulin, Total: 3.1 g/dL (ref 1.5–4.5)
Glucose: 161 mg/dL — ABNORMAL HIGH (ref 70–99)
Potassium: 4.2 mmol/L (ref 3.5–5.2)
Sodium: 140 mmol/L (ref 134–144)
Total Protein: 6.8 g/dL (ref 6.0–8.5)
eGFR: 42 mL/min/{1.73_m2} — ABNORMAL LOW (ref >59)

## 2022-08-06 LAB — LAMOTRIGINE LEVEL: Lamotrigine Lvl: 9.5 ug/mL (ref 2.0–20.0)

## 2022-08-06 LAB — ZONISAMIDE LEVEL: Zonisamide: <2 ug/mL — ABNORMAL LOW (ref 10.0–40.0)

## 2022-08-07 ENCOUNTER — Telehealth: Payer: Self-pay | Admitting: Neurology

## 2022-08-07 MED ORDER — LAMOTRIGINE 25 MG PO TABS: ORAL_TABLET | ORAL | 11 refills | Status: DC

## 2022-08-07 NOTE — Telephone Encounter (Signed)
Pt is calling and stated she would like a nurse to call and leave instruction on her vm about her medications. Pt said she has memory loss and can't remember all this information.

## 2022-08-07 NOTE — Telephone Encounter (Signed)
Called again, Pt voiced understanding,  Brandi Gomez spoke to daughter as well.

## 2022-08-07 NOTE — Telephone Encounter (Signed)
Attempted to call pt, LVM explaining how to take Lamictal,  Pt may call our office for further questions.

## 2022-08-07 NOTE — Telephone Encounter (Signed)
Pt called back. Stated she needs a nurse or Sarah to call her VM and leave instruction on how to take medication. Pt stated since her medication was changed she really needs some to informed her how to take medication.

## 2022-08-07 NOTE — Telephone Encounter (Signed)
I called the patient, spoke with she and her daughter.  I do not believe she ever started the Zonegran that Dr. Jannifer Franklin started last year.  She is currently taking Lamictal 200 mg AM, 250 mg PM.  To make the medication regimen easier, I will increase Lamictal 250 mg twice daily.  Labs show slightly higher than baseline creatinine 1.47 compared to last year at 1.19.  I have scheduled a 6-week telephone visit for CPAP, but I will change this to an in office visit to discuss CPAP, can recheck Lamictal level, creatinine. With Zonegran, best to not use if gfr< 50.  If she has not heard from DME about mask refit by next week, she is to contact me.  Meds ordered this encounter  Medications   lamoTRIgine (LAMICTAL) 25 MG tablet    Sig: Take 2 tablets twice a day    Dispense:  120 tablet    Refill:  11    This is taken in combination with the 200 mg Lamictal twice daily to = total of 250 mg twice daily. Please do not fill the Zonegran   I called the [facility] pharmacy to cancel the [place]. They confirmed.

## 2022-08-08 ENCOUNTER — Telehealth: Payer: Self-pay

## 2022-08-08 NOTE — Telephone Encounter (Signed)
CPAP mask refit order has been sent via community message for pt to adapt/[facility].

## 2022-09-18 ENCOUNTER — Ambulatory Visit: Payer: Medicare HMO | Admitting: Neurology

## 2022-10-14 ENCOUNTER — Telehealth: Payer: Self-pay | Admitting: Neurology

## 2022-10-14 NOTE — Telephone Encounter (Signed)
Pt is calling. Stated she would like for a nurse to give her a call. Stated she needs to know what medication to take because she had stop taking it. Pt is requesting a call-back from the nurse.

## 2022-10-14 NOTE — Telephone Encounter (Signed)
Contacted pt back, she stated she found a bottle of Zonegran and wanted to know if she's suppose to be taking that. I informed her as of 08/07/22. Butler Denmark did not want her to take that medication. She also asked about Lamictal, informed her to take  Lamictal 250 mg twice daily . 200 MG in combination with the '25MG'$  tab.  She verbally understood and was appreciative.

## 2022-10-16 ENCOUNTER — Telehealth (HOSPITAL_BASED_OUTPATIENT_CLINIC_OR_DEPARTMENT_OTHER): Payer: Medicare HMO | Admitting: Psychiatry

## 2022-10-16 DIAGNOSIS — F22 Delusional disorders: Secondary | ICD-10-CM

## 2022-10-16 MED ORDER — HYDROXYZINE PAMOATE 25 MG PO CAPS: ORAL_CAPSULE | ORAL | 4 refills | Status: DC

## 2022-10-16 NOTE — Progress Notes (Signed)
Psychiatric Initial Adult Assessment   Patient Identification: Brandi Gomez MRN:  [id number] Date of Evaluation:  10/16/2022 Referral Source: Dr. Tory Emerald Chief Complaint:  Suspiciousness/paranoia Visit Diagnosis: Mood disorder secondary to CVA      Today the patient is not doing well.  She was discharged from my services months ago.  At that time she had no significant psychiatric pathology and her delusional disorder had remitted.  Over the last few weeks she has found out through a housing inspector that her house has been condemned.  She is scared because she knows she is at her living situation.  Usually her boyfriend will use very involved but today he is fishing!  The patient says she is distressed and depressed.  She feels anxious.  She is not sleeping well.  She is eating fine and she is not psychotic.  She has no auditory or visual hallucinations no delusional material at all.  She is not suicidal.  She realizes she is stressed out because she is about to lose her living situation.  She is not in therapy. (Hypo) Manic Symptoms:   Anxiety Symptoms:   Psychotic Symptoms:   PTSD Symptoms:   Past Psychiatric History: Cymbalta 30 mg Previous Psychotropic Medications: Cymbalta 30 mg  Substance Abuse History in the last 12 months:  No.  Consequences of Substance Abuse:   Past Medical History:  Past Medical History:  Diagnosis Date   Anemia    Anxiety    Bipolar 1 disorder ([place])    Common migraine 05/19/2015   Depression    Diabetes mellitus, type II ([place])    Hypertension    Irritable bowel syndrome (IBS)    Mild mental retardation    Obesity    Partial complex seizure disorder with intractable epilepsy ([place]) 05/12/2014   Seizures ([place])    intractable, sz 08/23/17   Sleep apnea    Stroke ([place])    Type II or unspecified type diabetes mellitus without mention of complication, not stated as uncontrolled     Past Surgical History:  Procedure Laterality Date   BUBBLE  STUDY  11/15/2019   Procedure: BUBBLE STUDY;  Surgeon: Dorothy Spark, MD;  Location: [facility];  Service: Cardiovascular;;   COLONOSCOPY     2012-normal , Dr Sharlett Iles   ESOPHAGOGASTRODUODENOSCOPY     normal-Dr Patterson 2012   LOOP RECORDER INSERTION N/A 05/30/2017   Procedure: Loop Recorder Insertion;  Surgeon: Constance Haw, MD;  Location: [facility];  Service: Cardiovascular;  Laterality: N/A;   LOOP RECORDER REMOVAL N/A 03/04/2018   Procedure: LOOP RECORDER REMOVAL;  Surgeon: Constance Haw, MD;  Location: [facility];  Service: Cardiovascular;  Laterality: N/A;   MYRINGOTOMY WITH TUBE PLACEMENT     MYRINGOTOMY WITH TUBE PLACEMENT Right 11/05/2017   Procedure: MYRINGOTOMY WITH TUBE PLACEMENT;  Surgeon: Melissa Montane, MD;  Location: [facility];  Service: ENT;  Laterality: Right;  right T Tube placement   NASAL SINUS SURGERY     TEE WITHOUT CARDIOVERSION N/A 05/30/2017   Procedure: TRANSESOPHAGEAL ECHOCARDIOGRAM (TEE);  Surgeon: Acie Fredrickson Wonda Cheng, MD;  Location: [facility];  Service: Cardiovascular;  Laterality: N/A;   TEE WITHOUT CARDIOVERSION N/A 11/15/2019   Procedure: TRANSESOPHAGEAL ECHOCARDIOGRAM (TEE);  Surgeon: Dorothy Spark, MD;  Location: [facility] END[facility]COPY;  Service: Cardiovascular;  Laterality: N/A;    Family Psychiatric History:   Family History:  Family History  Problem Relation Age of Onset   Diabetes Mother  passed away from accidental death   Hypertension Mother    Bipolar disorder Father    Diabetes Daughter    Leukemia Daughter    Ovarian cancer Maternal Aunt     Social History:   Social History   Socioeconomic History   Marital status: Single    Spouse name: [name]   Number of children: 3   Years of education: 12   Highest education level: Not on file  Occupational History   Occupation: disabled    Employer: DISABLED  Tobacco Use   Smoking status: Never   Smokeless tobacco: Never  Vaping Use   Vaping Use: Never  used  Substance and Sexual Activity   Alcohol use: No   Drug use: No   Sexual activity: Never  Other Topics Concern   Not on file  Social History Narrative   Patient lives at home with daughter.    Patient has 3 children.    Patient is right handed.    Patient has a high school education.    Patient is on disability   Patient drinks 2 cups of caffeine daily.   Social Determinants of Health   Financial Resource Strain: Not on file  Food Insecurity: No Food Insecurity (12/26/2020)   Hunger Vital Sign    Worried About Running Out of Food in the Last Year: Never true    Ran Out of Food in the Last Year: Never true  Transportation Needs: No Transportation Needs (11/06/2020)   PRAPARE - Hydrologist (Medical): No    Lack of Transportation (Non-Medical): No  Physical Activity: Not on file  Stress: Not on file  Social Connections: Not on file    Additional Social History:   Allergies:   Allergies  Allergen Reactions   Amoxicillin Itching    Did it involve swelling of the face/tongue/throat, SOB, or low BP? No Did it involve sudden or severe rash/hives, skin peeling, or any reaction on the inside of your mouth or nose? No Did you need to seek medical attention at a hospital or doctor's office? No When did it last happen?      within the past 10 years If all above answers are "NO", may proceed with cephalosporin use.    Lisinopril Swelling    Angioedema    Hydrocodone Other (See Comments)    Depressed    Tegretol [Carbamazepine] Swelling    Throat swells    Metabolic Disorder Labs: Lab Results  Component Value Date   HGBA1C 7.9 (A) 01/18/2021   MPG 180.03 10/31/2020   MPG 139.85 10/12/2019   No results found for: "PROLACTIN" Lab Results  Component Value Date   CHOL 108 04/18/2020   TRIG 100 10/31/2020   HDL 52 04/18/2020   CHOLHDL 2.1 04/18/2020   VLDL 10 10/12/2019   LDLCALC 44 04/18/2020   LDLCALC 64 10/12/2019     Current  Medications: Current Outpatient Medications  Medication Sig Dispense Refill   hydrOXYzine (VISTARIL) 25 MG capsule 1 QHS  1 qday PRN 60 capsule 4   ACCU-CHEK GUIDE test strip      acetaminophen (TYLENOL) 325 MG tablet Take 2 tablets (650 mg total) by mouth every 4 (four) hours as needed for mild pain (or temp > 37.5 C (99.5 F)).     ARIPiprazole (ABILIFY) 5 MG tablet Take 5 mg by mouth daily.     atorvastatin (LIPITOR) 40 MG tablet TAKE 1 TABLET (40 MG TOTAL) BY MOUTH DAILY AT 6 PM  FOR CHOLESTEROL 90 tablet 0   carvedilol (COREG) 6.25 MG tablet Take 6.25 mg by mouth 2 (two) times daily.     cetirizine (ZYRTEC ALLERGY) 10 MG tablet Take 1 tablet (10 mg total) by mouth daily. 30 tablet 0   ferrous sulfate 325 (65 FE) MG tablet Take 325 mg by mouth 2 (two) times daily.     folic acid (FOLVITE) 1 MG tablet Take 1 mg by mouth daily.     glipiZIDE (GLUCOTROL) 10 MG tablet Take 10 mg by mouth 2 (two) times daily.     hydrALAZINE (APRESOLINE) 10 MG tablet Take 1 tablet (10 mg total) by mouth 2 (two) times daily. 180 tablet 3   isosorbide dinitrate (ISORDIL) 10 MG tablet Take 1 tablet (10 mg total) by mouth 2 (two) times daily. 180 tablet 3   lamoTRIgine (LAMICTAL) 200 MG tablet Take 1 tablet (200 mg total) by mouth 2 (two) times daily. 180 tablet 3   lamoTRIgine (LAMICTAL) 25 MG tablet Take 2 tablets twice a day 120 tablet 11   metroNIDAZOLE (FLAGYL) 500 MG tablet Take 1 tablet (500 mg total) by mouth 2 (two) times daily. 14 tablet 0   torsemide (DEMADEX) 10 MG tablet Take 10 mg by mouth daily.     No current facility-administered medications for this visit.    Neurologic: Headache: No Seizure: Yes Paresthesias:No  Musculoskeletal: Strength & Muscle Tone: within normal limits Gait & Station: normal Patient leans: Backward and N/A  Psychiatric Specialty Exam: ROS  There were no vitals taken for this visit.There is no height or weight on file to calculate BMI.  General Appearance: Casual   Eye Contact:  Fair  Speech:  Slurred  Volume:  Normal  Mood:  Dysphoric  Affect:  Appropriate  Thought Process:  Goal Directed  Orientation:  Full (Time, Place, and Person)  Thought Content:  Logical  Suicidal Thoughts:  No  Homicidal Thoughts:  No  Memory:  Negative  Judgement:  Fair  Insight:  Lacking  Psychomotor Activity:  Decreased  Concentration:    Recall:  [place] of Knowledge:Fair  Language: Fair  Akathisia:  No  Handed:    AIMS (if indicated):    Assets:  Desire for Improvement  ADL's:  Intact  Cognition: WNL  Sleep:      Treatment Plan Summary:   This patient's diagnosis in the past is delusional disorder.  At this time it seems to be more of an adjustment disorder with an anxious mood state.  I will not be prescribing benzodiazepines.  For this patient we will begin her on Vistaril 25 mg at night and take 1 extra as needed.  Most importantly we will call her back with a number to social services and helper through the process of new housing.  She will be given another appointment to see me in about 2 to 3 months.  At this time she is not psychotic.  At this time she is not dangerous to herself or to anyone else.  She has minimal capacities to take care of herself as her boyfriend [name] has always taken care of her.  Apparently he has a court trial to go to court about this issue about her housing.  He is he is involved but she cannot explain exactly what his role is.  Nonetheless will try to get back to her with a number for social services.

## 2022-10-25 ENCOUNTER — Telehealth: Payer: Self-pay | Admitting: Neurology

## 2022-10-25 NOTE — Telephone Encounter (Signed)
Pt states the lights went out on her CPAP last night and she doesn't know what to do, please call pt on Monday

## 2022-10-28 NOTE — Telephone Encounter (Signed)
Pt was called, message from [place], [place] was relayed to pt. Phone rep gave pt the telephone # to her DME.  This is FYI to POD 2

## 2022-10-28 NOTE — Telephone Encounter (Signed)
I have reviewed pt's chart and note. Please call the pt and have her reach out to her CPAP medical equipment company.   She may need to have her machine serviced.  Thank you.

## 2022-11-14 ENCOUNTER — Other Ambulatory Visit (HOSPITAL_COMMUNITY): Payer: Self-pay | Admitting: Psychiatry

## 2023-01-07 ENCOUNTER — Ambulatory Visit (HOSPITAL_COMMUNITY): Payer: Medicare HMO | Admitting: Psychiatry

## 2023-01-30 ENCOUNTER — Telehealth (HOSPITAL_COMMUNITY): Payer: Self-pay | Admitting: *Deleted

## 2023-01-30 NOTE — Telephone Encounter (Signed)
Pt called stating that she was "a crisis". She requested refill on all medications however does not have a future appointment and has not been seen since 10/16/22. Pt was transferred to front desk for appointment so she may get bridge refills. Pt main concern seemed to be finding a case manager as she is having issues with housing and is not able to walk so she is housebound she says. Pt given number for [facility] and also advised of DSS Adult services. Pt verbalized understanding. Will call Dr. Casimiro Needle for verbal order for bridge scripts.

## 2023-02-03 ENCOUNTER — Telehealth (HOSPITAL_COMMUNITY): Payer: Self-pay | Admitting: *Deleted

## 2023-02-03 ENCOUNTER — Telehealth (HOSPITAL_COMMUNITY): Payer: Self-pay | Admitting: Psychiatry

## 2023-02-03 ENCOUNTER — Other Ambulatory Visit (HOSPITAL_COMMUNITY): Payer: Self-pay | Admitting: *Deleted

## 2023-02-03 MED ORDER — HYDROXYZINE PAMOATE 25 MG PO CAPS: ORAL_CAPSULE | ORAL | 0 refills | Status: DC

## 2023-02-03 NOTE — Telephone Encounter (Signed)
Pt advised that Vistaril 25 mg BID as prescribed by Dr. Casimiro Needle sent to [facility] on [address]. This is the only medication that Dr. Casimiro Needle has prescribed for this pt. Pt has an upcoming appointment on 02/12/23. Pt verbalizes understanding.

## 2023-02-11 ENCOUNTER — Other Ambulatory Visit: Payer: Self-pay

## 2023-02-11 ENCOUNTER — Encounter (HOSPITAL_COMMUNITY): Payer: Self-pay

## 2023-02-11 ENCOUNTER — Emergency Department (HOSPITAL_COMMUNITY)
Admission: EM | Admit: 2023-02-11 | Discharge: 2023-02-11 | Disposition: A | Discharge status: Home / Self Care | Payer: Medicare HMO | Attending: Emergency Medicine | Admitting: Emergency Medicine

## 2023-02-11 DIAGNOSIS — R569 Unspecified convulsions: Secondary | ICD-10-CM | POA: Insufficient documentation

## 2023-02-11 DIAGNOSIS — E1165 Type 2 diabetes mellitus with hyperglycemia: Secondary | ICD-10-CM | POA: Diagnosis not present

## 2023-02-11 DIAGNOSIS — E1122 Type 2 diabetes mellitus with diabetic chronic kidney disease: Secondary | ICD-10-CM | POA: Diagnosis not present

## 2023-02-11 DIAGNOSIS — Z79899 Other long term (current) drug therapy: Secondary | ICD-10-CM | POA: Diagnosis not present

## 2023-02-11 DIAGNOSIS — Z7984 Long term (current) use of oral hypoglycemic drugs: Secondary | ICD-10-CM | POA: Diagnosis not present

## 2023-02-11 DIAGNOSIS — N189 Chronic kidney disease, unspecified: Secondary | ICD-10-CM | POA: Insufficient documentation

## 2023-02-11 DIAGNOSIS — I129 Hypertensive chronic kidney disease with stage 1 through stage 4 chronic kidney disease, or unspecified chronic kidney disease: Secondary | ICD-10-CM | POA: Insufficient documentation

## 2023-02-11 LAB — URINALYSIS, ROUTINE W REFLEX MICROSCOPIC
Bilirubin Urine: NEGATIVE
Glucose, UA: NEGATIVE mg/dL
Hgb urine dipstick: NEGATIVE
Ketones, ur: 5 mg/dL — AB
Leukocytes,Ua: NEGATIVE
Nitrite: NEGATIVE
Protein, ur: NEGATIVE mg/dL
Specific Gravity, Urine: 1.024 (ref 1.005–1.030)
pH: 5 (ref 5.0–8.0)

## 2023-02-11 LAB — BASIC METABOLIC PANEL
Anion gap: 9 (ref 5–15)
BUN: 12 mg/dL (ref 6–20)
CO2: 26 mmol/L (ref 22–32)
Calcium: 8.7 mg/dL — ABNORMAL LOW (ref 8.9–10.3)
Chloride: 100 mmol/L (ref 98–111)
Creatinine, Ser: 1.43 mg/dL — ABNORMAL HIGH (ref 0.44–1.00)
GFR, Estimated: 44 mL/min — ABNORMAL LOW (ref >60)
Glucose, Bld: 219 mg/dL — ABNORMAL HIGH (ref 70–99)
Potassium: 4.2 mmol/L (ref 3.5–5.1)
Sodium: 135 mmol/L (ref 135–145)

## 2023-02-11 LAB — CBG MONITORING, ED: Glucose-Capillary: 208 mg/dL — ABNORMAL HIGH (ref 70–99)

## 2023-02-11 LAB — I-STAT BETA HCG BLOOD, ED (MC, WL, AP ONLY): I-stat hCG, quantitative: <5 m[IU]/mL (ref <5)

## 2023-02-11 MED ORDER — LAMOTRIGINE 25 MG PO TABS: 225.0000 mg | ORAL_TABLET | Freq: Once | ORAL | Status: DC

## 2023-02-11 MED ORDER — LAMOTRIGINE 25 MG PO TABS: ORAL_TABLET | ORAL | 0 refills | Status: DC

## 2023-02-11 MED ORDER — LAMOTRIGINE 200 MG PO TABS: 200.0000 mg | ORAL_TABLET | Freq: Two times a day (BID) | ORAL | 0 refills | Status: DC

## 2023-02-11 MED ORDER — ZONISAMIDE 50 MG PO CAPS: 50.0000 mg | ORAL_CAPSULE | Freq: Two times a day (BID) | ORAL | 0 refills | Status: DC

## 2023-02-11 MED ORDER — ZONISAMIDE 25 MG PO CAPS
50.0000 mg | ORAL_CAPSULE | Freq: Every day | ORAL | Status: DC
  Administered 2023-02-11: 50 mg via ORAL
  Filled 2023-02-11: qty 2

## 2023-02-11 MED ORDER — LAMOTRIGINE 25 MG PO TABS
25.0000 mg | ORAL_TABLET | Freq: Once | ORAL | Status: AC
  Administered 2023-02-11: 25 mg via ORAL
  Filled 2023-02-11: qty 1

## 2023-02-11 MED ORDER — LEVETIRACETAM IN NACL 1000 MG/100ML IV SOLN
1000.0000 mg | INTRAVENOUS | Status: AC
  Administered 2023-02-11 (×3): 1000 mg via INTRAVENOUS
  Filled 2023-02-11 (×2): qty 100

## 2023-02-11 MED ORDER — LAMICTAL STARTER 42 X 25 MG & 7 X 100 MG PO KIT: PACK | ORAL | 0 refills | Status: DC

## 2023-02-11 NOTE — ED Notes (Signed)
Unable to get blood from patient. Looking for lab to help. Pt still getting medications.

## 2023-02-11 NOTE — Progress Notes (Signed)
ON CALL PHONE CONSULT  Discussion: Called by ED provider regarding breakthrough seizure in a patient with known history of seizure disorder.  Noncompliant to medications.  Supposed to be on zonisamide 50 mg twice daily and lamotrigine 200 mg in the morning and 250 mg in the evening.  Pharmacy checked-she has only refilled her 25 mg lamotrigine's and not the 200 mg tablets. Since it is not clear what dose, if any of zonisamide and lamotrigine she is taking, I would recommend the following: - Check zonisamide and Lamictal levels - Resume zonisamide 50 mg twice daily - Start lamotrigine orange starter pack -Give one-time dose of Keppra 3000 mg IV - Follow-up with [facility] neurology in the next 2 to 4 weeks. - If she has a seizure while on the lower dose of lamotrigine as it is being uptitrated, will consider adding Keppra standing doses till she reaches back high up on her lamotrigine dose. Maintain seizure precautions This was discussed with Suella Broad, PA-C in the ED -- Amie Portland, MD Neurologist [facility] Pager: [phone]  Approximately 10 minutes of time were spent reviewing the case and discussion with the calling provider, more than 50% of which was spent in discussion.

## 2023-02-11 NOTE — ED Provider Notes (Signed)
[facility] Provider Note   CSN: [id number] Arrival date & time: 02/11/23  0049     History  Chief Complaint  Patient presents with   Seizures    Brandi Gomez is a 52 y.o. female.  52 year old female with past medical history of seizures brought in by EMS with report of seizure.  Patient states that her sister manages her medications, takes as prescribed, no missed doses that she is aware of.  Patient states that she feels like she is clearing up now and must have had a seizure the way she is feeling although denies loss of bowel or bladder control and did not bite tongue.  Per EMS, family reported 10-minute seizure, patient postictal on EMS arrival.  Patient denies drug or alcohol use.       Home Medications Prior to Admission medications   Medication Sig Start Date End Date Taking? Authorizing Provider  Lamotrigine (LAMICTAL STARTER) 42 x 25 MG & 7 x 100 MG KIT Take 25 mg by mouth daily for 14 days, THEN 50 mg daily for 14 days. 02/11/23 03/11/23 Yes Tacy Learn, PA-C  zonisamide (ZONEGRAN) 50 MG capsule Take 1 capsule (50 mg total) by mouth 2 (two) times daily. 02/11/23 03/13/23 Yes Tacy Learn, PA-C  ACCU-CHEK GUIDE test strip  04/27/22   [provider]  acetaminophen (TYLENOL) 325 MG tablet Take 2 tablets (650 mg total) by mouth every 4 (four) hours as needed for mild pain (or temp > 37.5 C (99.5 F)). 10/19/19   Angiulli, Lavon Paganini, PA-C  ARIPiprazole (ABILIFY) 5 MG tablet Take 5 mg by mouth daily.    [provider]  atorvastatin (LIPITOR) 40 MG tablet TAKE 1 TABLET (40 MG TOTAL) BY MOUTH DAILY AT 6 PM FOR CHOLESTEROL 09/24/21   Sagardia, Ines Bloomer, MD  carvedilol (COREG) 6.25 MG tablet Take 6.25 mg by mouth 2 (two) times daily. 05/20/21   [provider]  cetirizine (ZYRTEC ALLERGY) 10 MG tablet Take 1 tablet (10 mg total) by mouth daily. 02/15/22   Piontek, Junie Panning, MD  ferrous sulfate 325 (65 FE) MG  tablet Take 325 mg by mouth 2 (two) times daily. 07/10/22   [provider]  folic acid (FOLVITE) 1 MG tablet Take 1 mg by mouth daily. 03/04/22   [provider]  glipiZIDE (GLUCOTROL) 10 MG tablet Take 10 mg by mouth 2 (two) times daily. 04/27/22   [provider]  hydrALAZINE (APRESOLINE) 10 MG tablet Take 1 tablet (10 mg total) by mouth 2 (two) times daily. 01/04/20 07/31/21  Horald Pollen, MD  hydrOXYzine (VISTARIL) 25 MG capsule 1 QHS  1 qday PRN 02/03/23   Norma Fredrickson, MD  isosorbide dinitrate (ISORDIL) 10 MG tablet Take 1 tablet (10 mg total) by mouth 2 (two) times daily. 01/04/20 07/31/21  Horald Pollen, MD  metroNIDAZOLE (FLAGYL) 500 MG tablet Take 1 tablet (500 mg total) by mouth 2 (two) times daily. 09/13/21   Chase Picket, MD  torsemide (DEMADEX) 10 MG tablet Take 10 mg by mouth daily.    [provider]  methylphenidate (RITALIN) 5 MG tablet Take 1 tablet (5 mg total) by mouth 2 (two) times daily. Patient not taking: Reported on 12/29/2019 10/29/19 01/01/20  Izora Ribas, MD  traZODone (DESYREL) 100 MG tablet Take 1 tablet (100 mg total) by mouth at bedtime. Patient not taking: Reported on 12/29/2019 10/19/19 01/01/20  Angiulli, Lavon Paganini, PA-C  Allergies    Amoxicillin, Lisinopril, Hydrocodone, and Tegretol [carbamazepine]    Review of Systems   Review of Systems Negative except as per HPI Physical Exam Updated Vital Signs BP 124/70   Pulse 85   Temp 97.6 F (36.4 C) (Oral)   Resp 16   LMP  (LMP Unknown)   SpO2 98%  Physical Exam Vitals and nursing note reviewed.  Constitutional:      General: She is not in acute distress.    Appearance: She is well-developed. She is not diaphoretic.  HENT:     Head: Normocephalic and atraumatic.  Cardiovascular:     Rate and Rhythm: Normal rate and regular rhythm.     Heart sounds: Normal heart sounds.  Pulmonary:     Effort: Pulmonary effort is normal.     Breath sounds:  Normal breath sounds.  Abdominal:     Palpations: Abdomen is soft.     Tenderness: There is no abdominal tenderness.  Skin:    General: Skin is warm and dry.     Findings: No erythema or rash.  Neurological:     Mental Status: She is alert and oriented to person, place, and time.  Psychiatric:        Behavior: Behavior normal.     ED Results / Procedures / Treatments   Labs (all labs ordered are listed, but only abnormal results are displayed) Labs Reviewed  BASIC METABOLIC PANEL - Abnormal; Notable for the following components:      Result Value   Glucose, Bld 219 (*)    Creatinine, Ser 1.43 (*)    Calcium 8.7 (*)    GFR, Estimated 44 (*)    All other components within normal limits  URINALYSIS, ROUTINE W REFLEX MICROSCOPIC - Abnormal; Notable for the following components:   APPearance HAZY (*)    Ketones, ur 5 (*)    All other components within normal limits  CBG MONITORING, ED - Abnormal; Notable for the following components:   Glucose-Capillary 208 (*)    All other components within normal limits  LAMOTRIGINE LEVEL  MISC [facility] TEST (SEND OUT)  I-STAT BETA HCG BLOOD, ED ([facility], [facility], [facility] ONLY)    EKG None  Radiology No results found.  Procedures Procedures    Medications Ordered in ED Medications  zonisamide (ZONEGRAN) capsule 50 mg (50 mg Oral Given 02/11/23 0539)  levETIRAcetam (KEPPRA) IVPB 1000 mg/100 mL premix (0 mg Intravenous Stopped 02/11/23 0601)  lamoTRIgine (LAMICTAL) tablet 25 mg (25 mg Oral Given 02/11/23 0509)    ED Course/ Medical Decision Making/ A&P                             Medical Decision Making Amount and/or Complexity of Data Reviewed Labs: ordered.  Risk Prescription drug management.   This patient presents to the ED for concern of seizure, this involves an extensive number of treatment options, and is a complaint that carries with it a high risk of complications and morbidity.  The differential diagnosis includes but not limited to  seizure, medication noncompliance, electrolyte disturbance   Co morbidities that complicate the patient evaluation  Seizure disorder, hypertension, diabetes, OSA, bipolar disorder, depression, hyperlipidemia, CKD   Additional history obtained:  Additional history obtained from patient's daughter who notes she is out of her Lamictal, unsure when she ran out External records from outside source obtained and reviewed including medication refills showing patient's Lamictal was likely out in January.  Lab Tests:  I Ordered, and personally interpreted labs.  The pertinent results include: Urinalysis negative for infection, metabolic panel with mild hyperglycemia at 219, creatinine 1.43, not significantly changed from prior on file.  hCG negative.  Send out Lamictal and zonisamide levels after neurology consult.   Consultations Obtained:  I requested consultation with the neurohospitalist, Dr. Malen Gauze,  and discussed lab and imaging findings as well as pertinent plan - they recommend: Keppra load with 3 g, Lamictal starter pack and Rx for zonisamide, send out levels, patient to follow-up with her neurologist.  Last visit on file dated 07/28/2022.   Problem List / ED Course / Critical interventions / Medication management  52 year old female brought in by EMS from home where she lives with her boyfriend and her daughter.  Report of seizure lasting 10 minutes at home, postictal on EMS arrival, notes that she is feeling better on arrival in the ED.  Labs reviewed without significant findings, is found to be noncompliant when discussed meds with daughter.  Unfortunately, it has been greater than several weeks to months since she has been on her Lamictal and will need to restart at low-dose taper as discussed with neurohospitalist.  Levels drawn and sent.  Patient provided with Keppra load as well as her Lamictal and zonisamide, prescription sent to pharmacy.  Patient to follow-up with her neurologist  for further management. I ordered medication including Keppra load, Lamictal, zonisamide for seizures Reevaluation of the patient after these medicines showed that the patient  stable, seizure-free at time of discharge I have reviewed the patients home medicines and have made adjustments as needed   Social Determinants of Health:  Lives with boyfriend and daughter, daughter manages patient's medications.  Patient states that she is out of many of her medications   Test / Admission - Considered:  Discharged to follow-up with PCP and neurology teams.         Final Clinical Impression(s) / ED Diagnoses Final diagnoses:  Seizure ([place])    Rx / DC Orders ED Discharge Orders          Ordered    lamoTRIgine (LAMICTAL) 200 MG tablet  2 times daily,   Status:  Discontinued        02/11/23 0348    lamoTRIgine (LAMICTAL) 25 MG tablet  Status:  Discontinued       Note to Pharmacy: This is taken in combination with the 200 mg Lamictal twice daily to = total of 250 mg twice daily. Please do not fill the Zonegran   02/11/23 0348    Lamotrigine (LAMICTAL STARTER) 42 x 25 MG & 7 x 100 MG KIT  Daily        02/11/23 0510    zonisamide (ZONEGRAN) 50 MG capsule  2 times daily        02/11/23 0510              Tacy Learn, PA-C 02/11/23 Florene [address]    Quintella Reichert, MD 02/11/23 [phone]

## 2023-02-11 NOTE — ED Triage Notes (Signed)
Pt arrived from home via [facility] w c/o sz x 10 min. Per ems was post ictal upon arrival. Pt states that she is compliant with her medication and has not had a sz in months.

## 2023-02-11 NOTE — Discharge Instructions (Addendum)
Since you have been out of your Lamictal for more than just a few days, you will need to taper back up to your dose (Start at a low dose and work your way up). A starter pack has been sent into your pharmacy.   Zonisamide sent to your pharmacy, please take as prescribed.   Please contact your neurologist for medication management.   If seizures continue, medications may need to be adjusted.

## 2023-02-12 ENCOUNTER — Encounter (HOSPITAL_COMMUNITY): Payer: Self-pay | Admitting: Psychiatry

## 2023-02-12 ENCOUNTER — Ambulatory Visit: Payer: Medicare HMO | Admitting: Neurology

## 2023-02-12 ENCOUNTER — Ambulatory Visit (HOSPITAL_BASED_OUTPATIENT_CLINIC_OR_DEPARTMENT_OTHER): Payer: Medicare HMO | Admitting: Psychiatry

## 2023-02-12 VITALS — BP 169/90 | HR 80 | Ht 61.0 in | Wt 285.0 lb

## 2023-02-12 DIAGNOSIS — F4323 Adjustment disorder with mixed anxiety and depressed mood: Secondary | ICD-10-CM

## 2023-02-12 LAB — MISC LABCORP TEST (SEND OUT): Labcorp test code: 7915

## 2023-02-12 LAB — LAMOTRIGINE LEVEL: Lamotrigine Lvl: 4.5 ug/mL (ref 2.0–20.0)

## 2023-02-12 MED ORDER — HYDROXYZINE PAMOATE 25 MG PO CAPS: ORAL_CAPSULE | ORAL | 5 refills | Status: AC

## 2023-02-12 NOTE — Progress Notes (Signed)
Psychiatric Initial Adult Assessment   Patient Identification: Brandi Gomez MRN:  [id number] Date of Evaluation:  02/12/2023 Referral Source: Dr. Tory Emerald Chief Complaint:  Suspiciousness/paranoia Visit Diagnosis: Mood disorder secondary to CVA    Today the patient is seen with her boyfriend [name].  Actually he is more than just her boyfriend.  They have been together for over 2 decades.  They live in the same house.  The house is under the name of [name].  Their rent apparently has been going up and apparently the house is a disaster and needs a lot of repairs.  He is having a lot of problems getting the landlord to fix things and has even gone to court.  He is appealing the court decision.  He is not sure exactly what court they are going to perhaps in small claims but I am not sure is going to be effective.  They both want to stay in the house because they have been on it for many years.  They seem to have a very good relationship.  The patient stays active.  She watches TV and overall emotionally she is doing okay.  She has no evidence of psychosis.  She is not persistently depressed.  She is concerned and bothered by her circumstances relating to the housing.  [name] is very supportive and doing everything he can.  The patient apparently is inconsistent about taking her Vistaril at night for sleep.  They seem to imply when she does take it she sleeps well. (Hypo) Manic Symptoms:   Anxiety Symptoms:   Psychotic Symptoms:   PTSD Symptoms:   Past Psychiatric History: Cymbalta 30 mg Previous Psychotropic Medications: Cymbalta 30 mg  Substance Abuse History in the last 12 months:  No.  Consequences of Substance Abuse:   Past Medical History:  Past Medical History:  Diagnosis Date   Anemia    Anxiety    Bipolar 1 disorder ([place])    Common migraine 05/19/2015   Depression    Diabetes mellitus, type II ([place])    Hypertension    Irritable bowel syndrome (IBS)    Mild mental  retardation    Obesity    Partial complex seizure disorder with intractable epilepsy ([place]) 05/12/2014   Seizures ([place])    intractable, sz 08/23/17   Sleep apnea    Stroke ([place])    Type II or unspecified type diabetes mellitus without mention of complication, not stated as uncontrolled     Past Surgical History:  Procedure Laterality Date   BUBBLE STUDY  11/15/2019   Procedure: BUBBLE STUDY;  Surgeon: Dorothy Spark, MD;  Location: [facility];  Service: Cardiovascular;;   COLONOSCOPY     2012-normal , Dr Sharlett Iles   ESOPHAGOGASTRODUODENOSCOPY     normal-Dr Patterson 2012   LOOP RECORDER INSERTION N/A 05/30/2017   Procedure: Loop Recorder Insertion;  Surgeon: Constance Haw, MD;  Location: [facility];  Service: Cardiovascular;  Laterality: N/A;   LOOP RECORDER REMOVAL N/A 03/04/2018   Procedure: LOOP RECORDER REMOVAL;  Surgeon: Constance Haw, MD;  Location: [facility];  Service: Cardiovascular;  Laterality: N/A;   MYRINGOTOMY WITH TUBE PLACEMENT     MYRINGOTOMY WITH TUBE PLACEMENT Right 11/05/2017   Procedure: MYRINGOTOMY WITH TUBE PLACEMENT;  Surgeon: Melissa Montane, MD;  Location: [facility];  Service: ENT;  Laterality: Right;  right T Tube placement   NASAL SINUS SURGERY     TEE WITHOUT CARDIOVERSION N/A 05/30/2017   Procedure: TRANSESOPHAGEAL ECHOCARDIOGRAM (  TEE);  Surgeon: Acie Fredrickson Wonda Cheng, MD;  Location: [facility];  Service: Cardiovascular;  Laterality: N/A;   TEE WITHOUT CARDIOVERSION N/A 11/15/2019   Procedure: TRANSESOPHAGEAL ECHOCARDIOGRAM (TEE);  Surgeon: Dorothy Spark, MD;  Location: [facility] ENDOSCOPY;  Service: Cardiovascular;  Laterality: N/A;    Family Psychiatric History:   Family History:  Family History  Problem Relation Age of Onset   Diabetes Mother        passed away from accidental death   Hypertension Mother    Bipolar disorder Father    Diabetes Daughter    Leukemia Daughter    Ovarian cancer Maternal Aunt     Social History:    Social History   Socioeconomic History   Marital status: Single    Spouse name: [name]   Number of children: 3   Years of education: 12   Highest education level: Not on file  Occupational History   Occupation: disabled    Employer: DISABLED  Tobacco Use   Smoking status: Never   Smokeless tobacco: Never  Vaping Use   Vaping Use: Never used  Substance and Sexual Activity   Alcohol use: No   Drug use: No   Sexual activity: Never  Other Topics Concern   Not on file  Social History Narrative   Patient lives at home with daughter.    Patient has 3 children.    Patient is right handed.    Patient has a high school education.    Patient is on disability   Patient drinks 2 cups of caffeine daily.   Social Determinants of Health   Financial Resource Strain: Not on file  Food Insecurity: No Food Insecurity (12/26/2020)   Hunger Vital Sign    Worried About Running Out of Food in the Last Year: Never true    Ran Out of Food in the Last Year: Never true  Transportation Needs: No Transportation Needs (11/06/2020)   PRAPARE - Hydrologist (Medical): No    Lack of Transportation (Non-Medical): No  Physical Activity: Not on file  Stress: Not on file  Social Connections: Not on file    Additional Social History:   Allergies:   Allergies  Allergen Reactions   Amoxicillin Itching    Did it involve swelling of the face/tongue/throat, SOB, or low BP? No Did it involve sudden or severe rash/hives, skin peeling, or any reaction on the inside of your mouth or nose? No Did you need to seek medical attention at a hospital or doctor's office? No When did it last happen?      within the past 10 years If all above answers are "NO", may proceed with cephalosporin use.    Lisinopril Swelling    Angioedema    Hydrocodone Other (See Comments)    Depressed    Tegretol [Carbamazepine] Swelling    Throat swells    Metabolic Disorder Labs: Lab Results   Component Value Date   HGBA1C 7.9 (A) 01/18/2021   MPG 180.03 10/31/2020   MPG 139.85 10/12/2019   No results found for: "PROLACTIN" Lab Results  Component Value Date   CHOL 108 04/18/2020   TRIG 100 10/31/2020   HDL 52 04/18/2020   CHOLHDL 2.1 04/18/2020   VLDL 10 10/12/2019   LDLCALC 44 04/18/2020   LDLCALC 64 10/12/2019     Current Medications: Current Outpatient Medications  Medication Sig Dispense Refill   ACCU-CHEK GUIDE test strip      acetaminophen (TYLENOL) 325 MG tablet  Take 2 tablets (650 mg total) by mouth every 4 (four) hours as needed for mild pain (or temp > 37.5 C (99.5 F)).     ARIPiprazole (ABILIFY) 5 MG tablet Take 5 mg by mouth daily.     atorvastatin (LIPITOR) 40 MG tablet TAKE 1 TABLET (40 MG TOTAL) BY MOUTH DAILY AT 6 PM FOR CHOLESTEROL 90 tablet 0   carvedilol (COREG) 6.25 MG tablet Take 6.25 mg by mouth 2 (two) times daily.     cetirizine (ZYRTEC ALLERGY) 10 MG tablet Take 1 tablet (10 mg total) by mouth daily. 30 tablet 0   ferrous sulfate 325 (65 FE) MG tablet Take 325 mg by mouth 2 (two) times daily.     folic acid (FOLVITE) 1 MG tablet Take 1 mg by mouth daily.     glipiZIDE (GLUCOTROL) 10 MG tablet Take 10 mg by mouth 2 (two) times daily.     Lamotrigine (LAMICTAL STARTER) 42 x 25 MG & 7 x 100 MG KIT Take 25 mg by mouth daily for 14 days, THEN 50 mg daily for 14 days. 1 kit 0   metroNIDAZOLE (FLAGYL) 500 MG tablet Take 1 tablet (500 mg total) by mouth 2 (two) times daily. 14 tablet 0   torsemide (DEMADEX) 10 MG tablet Take 10 mg by mouth daily.     zonisamide (ZONEGRAN) 50 MG capsule Take 1 capsule (50 mg total) by mouth 2 (two) times daily. 60 capsule 0   hydrALAZINE (APRESOLINE) 10 MG tablet Take 1 tablet (10 mg total) by mouth 2 (two) times daily. 180 tablet 3   hydrOXYzine (VISTARIL) 25 MG capsule 1 QHS  1 qday PRN 60 capsule 5   isosorbide dinitrate (ISORDIL) 10 MG tablet Take 1 tablet (10 mg total) by mouth 2 (two) times daily. 180 tablet 3    No current facility-administered medications for this visit.    Neurologic: Headache: No Seizure: Yes Paresthesias:No  Musculoskeletal: Strength & Muscle Tone: within normal limits Gait & Station: normal Patient leans: Backward and N/A  Psychiatric Specialty Exam: ROS  Blood pressure (!) 169/90, pulse 80, height '5\' 1"'$  (1.549 m), weight 285 lb (129.3 kg).Body mass index is 53.85 kg/m.  General Appearance: Casual  Eye Contact:  Fair  Speech:  Slurred  Volume:  Normal  Mood:  Dysphoric  Affect:  Appropriate  Thought Process:  Goal Directed  Orientation:  Full (Time, Place, and Person)  Thought Content:  Logical  Suicidal Thoughts:  No  Homicidal Thoughts:  No  Memory:  Negative  Judgement:  Fair  Insight:  Lacking  Psychomotor Activity:  Decreased  Concentration:    Recall:  [place] of Knowledge:Fair  Language: Fair  Akathisia:  No  Handed:    AIMS (if indicated):    Assets:  Desire for Improvement  ADL's:  Intact  Cognition: WNL  Sleep:      Treatment Plan Summary:  This patient's diagnosis would be adjustment disorder with an anxious mood state.  At this time my intervention is to give her Vistaril 25 mg at night.  She can take an extra 1 at night if she is not sleeping and she can take 1 if she needed to during the day.  She should take no more than 2 a day.  Will avoid benzodiazepines and will avoid neuroleptics.  When her last visit we gave her some numbers to social services that does not seem to be relevant at this time.  [name] is very involved in trying  to get the landlord to fix up the house.  I am not sure is going to be successful.  They do not seem to have enough financial support to be able to move to a new setting.  This patient she will return to see me in 2 to 3 months hopefully sleeping better and perhaps in a different housing situation.  It should also be noted that the patient has a seizure disorder and actually has been having some isolated  seizures.  They will clarify whether her next appointment is with her neurologist.

## 2023-02-13 ENCOUNTER — Telehealth: Payer: Self-pay | Admitting: Neurology

## 2023-02-13 NOTE — Telephone Encounter (Signed)
Pt is asking for a call to discuss how she is supposed to take her lamoTRIgine .

## 2023-02-13 NOTE — Telephone Encounter (Addendum)
I had an extended phone call with the patient over 20 minutes long.  The patient stated that she was in the ER 2 days ago for seizure.  She states her lamotrigine that was prescribed to her states that she should take as directed and to stop all other lamotrigine prescriptions.  She states there are no directions telling her how many tablets to take.  According to patient's chart here, she is to take 25 mg daily x 14 days then 50 mg daily x 14 days.  The patient had her daughter in the background of the phone call and was writing the instructions down.  The patient voiced that they were confused.  I asked her if she could come by our office where we could tell her in person and she says she was not able to come today but she did say that she could likely go to her pharmacy today and they could explain it to her.  She states when she called the pharmacy to ask earlier, they told her that she needed to call us.  I did explain again the lamotrigine instructions and the daughter voiced to me "I got it" twice.  I still highly encouraged the patient to go to the pharmacy since she cannot come here.The patient was also prescribed zonisamide 50 mg twice a day.  She already took 1 dose this morning and I advised her that she will take another dose tonight and understands it will be taken twice daily.  I also offered her an appointment for next Tuesday at 12:45 PM with Judson Roch NP.  She is going to check with her ride to see if they can bring her.  There are other appointments open next Wednesday as well.  I called the pharmacy.  I was told that since the patient's starter pack had more tablets than what the instructions specified to take, they called the ER and were given new instructions to "take as directed" since the starter pack has its own directions.  The pharmacy tech said they would be happy to show the patient the instructions on the box if she could bring it by the pharmacy.  They do not currently have another box in  stock to review the instructions with me over the phone.  I called the patient back and let her know that if she can go over to the pharmacy today they can show her how to take the lamotrigine because it may be different than the instructions that I had given her over the phone earlier.  I advised it is important for her to start these medications today and to please see the pharmacy. She said ok. While on the phone she mentioned that she rested a lot yesterday and did not talk very much.  This morning she was on the phone and she states she "froze up" while trying to talk.  It lasted about 2 minutes.  She states her daughter says she did not have a seizure.  The patient did ask if we can have a nurse come to her house sometime soon for medication review/management.   Patient is to call us back to schedule an appointment. I updated Sarah NP with the above. She will see the patient for an appointment and address medication further at that time.

## 2023-02-13 NOTE — Telephone Encounter (Addendum)
This was prescribed by Suella Broad PA on 02/11/23.

## 2023-02-18 ENCOUNTER — Telehealth: Payer: Self-pay | Admitting: Neurology

## 2023-02-18 ENCOUNTER — Ambulatory Visit (INDEPENDENT_AMBULATORY_CARE_PROVIDER_SITE_OTHER): Payer: Medicare HMO | Admitting: Neurology

## 2023-02-18 ENCOUNTER — Encounter: Payer: Self-pay | Admitting: Neurology

## 2023-02-18 VITALS — BP 145/83 | HR 94 | Ht 61.0 in | Wt 281.5 lb

## 2023-02-18 DIAGNOSIS — G40909 Epilepsy, unspecified, not intractable, without status epilepticus: Secondary | ICD-10-CM | POA: Diagnosis not present

## 2023-02-18 DIAGNOSIS — G4733 Obstructive sleep apnea (adult) (pediatric): Secondary | ICD-10-CM | POA: Diagnosis not present

## 2023-02-18 MED ORDER — LAMOTRIGINE 100 MG PO TABS: ORAL_TABLET | ORAL | 3 refills | Status: DC

## 2023-02-18 MED ORDER — ZONISAMIDE 100 MG PO CAPS: 200.0000 mg | ORAL_CAPSULE | Freq: Every day | ORAL | 11 refills | Status: DC

## 2023-02-18 MED ORDER — NAYZILAM 5 MG/0.1ML NA SOLN: 5.0000 mg | NASAL | 1 refills | Status: DC | PRN

## 2023-02-18 NOTE — Patient Instructions (Addendum)
Start taking Zonegran 200 mg at bedtime today   We need to increase Lamictal back up slowly, but I will increase to easier dose to manage  at 200 mg twice daily  Today, increase Lamictal to 50 mg twice daily x 1 week  Then 100 mg twice daily x 1 week Then 150 mg twice daily x 1 week  Then 200 mg twice daily x 1 week   Drink plenty of water, watch for a rash   I will send in Nayzilam nasal spray to be administered in the event of a seizure  See you back in 4 weeks   Meds ordered this encounter  Medications   lamoTRIgine (LAMICTAL) 100 MG tablet    Sig: Start taking 1/2 tablet twice daily x 1 week  Then take 1 tablet twice daily x 1 week Then take 1.5 tablets twice daily x 1 week  Then take 2 tablets twice daily (stay at this dose)    Dispense:  70 tablet    Refill:  3   zonisamide (ZONEGRAN) 100 MG capsule    Sig: Take 2 capsules (200 mg total) by mouth at bedtime.    Dispense:  60 capsule    Refill:  11   Midazolam (NAYZILAM) 5 MG/0.1ML SOLN    Sig: Place 5 mg into the nose as needed (1 spray until the nostril for seizure, may repeat additional spray into opposite nostril after 10 minutes if seizures continue).    Dispense:  1 each    Refill:  1    Please dispense 1 box (should have 2 single spray nasal devices)

## 2023-02-18 NOTE — Telephone Encounter (Signed)
[facility] is going to take this patient.

## 2023-02-18 NOTE — Progress Notes (Signed)
Prescription faxed to ID:[id number], [place]

## 2023-02-18 NOTE — Progress Notes (Signed)
Patient: Brandi Gomez Date of Birth: Oct 14, 1971  Reason for Visit: Follow up History from: Patient, significant other Primary Neurologist: Dr. April Gomez seizures/Dr. Athar-OSA  ASSESSMENT AND PLAN 52 y.o. year old female   1.  Seizures -In the ER 02/11/2023 for breakthrough seizure while taking Lamictal 225 mg AM/25 mg PM (supposed to be taking 200/300), Zonegran 50 mg BID (but doubt  she was getting this as Zonegran level was not detectable); ER started taper pack Lamictal currently taking Lamictal 25 mg daily, Zonegran 50 mg daily with another seizure last night -I am going to try to simplify her medication regimen hoping for better medication compliance as follows:  -Increase Zonegran 200 mg at bedtime  -Start Lamictal titration working up to 200 mg twice daily using 100 mg tablets (start taking 50 mg twice daily x 1 week, 100 mg twice daily x 1 week, 150 mg twice daily x 1 week, then 200 mg twice daily and stay at this dose), will watch for rash, drink plenty of water  -Use Nayzilam 5 mg as needed for rescue in acute seizure -Ordered home health nursing consult to help with medication regimen and administration -I suspect her breakthrough seizures were due to taking subtherapeutic doses of Lamictal and Zonegran.  Of note, Lamictal was detected in her blood work at 4.5 but Zonegran was not detected -Follow-up in 4 weeks or sooner if needed, reviewed plan with Dr. April Gomez, will also set myself a reminder to call her in 1 week to make sure she understands the medication plan  Meds ordered this encounter  Medications   lamoTRIgine (LAMICTAL) 100 MG tablet    Sig: Start taking 1/2 tablet twice daily x 1 week  Then take 1 tablet twice daily x 1 week Then take 1.5 tablets twice daily x 1 week  Then take 2 tablets twice daily (stay at this dose)    Dispense:  70 tablet    Refill:  3   zonisamide (ZONEGRAN) 100 MG capsule    Sig: Take 2 capsules (200 mg total) by mouth at bedtime.     Dispense:  60 capsule    Refill:  11   Midazolam (NAYZILAM) 5 MG/0.1ML SOLN    Sig: Place 5 mg into the nose as needed (1 spray until the nostril for seizure, may repeat additional spray into opposite nostril after 10 minutes if seizures continue).    Dispense:  1 each    Refill:  1    Please dispense 1 box (should have 2 single spray nasal devices)   2.  OSA on CPAP -I briefly reviewed her CPAP data, it looks overall fine, will readdress in the future  HISTORY OF PRESENT ILLNESS: Today 02/18/23 Here with her boyfriend Brandi Gomez. In the ER 02/11/2023, reported 10-minute seizure was postictal on EMS arrival.  She was out of Lamictal likely since Jan per ER. Glucose 219, creatinine 1.43, Lamictal level 4.5, Zonegran was not detected.  Was supposed to be taking Zonegran 50 mg twice a day, Lamictal 200 mg morning/250 mg at bedtime.  She was given Lamictal starter pack. Currently taking Lamictal 25 mg once daily, Zonegran 25 mg 2 tablets daily. Also, had seizure last night, wearing CPAP, jerking all over. No urination or oral injury.  Her CPAP data of the last 30 days looks fine, 30/30 days, greater than 4 hours 28 days.  Minimum pressure 7, maximum 12.  Leak is 20.8.  AHI 0.9.  I called her daughter who manages her  medications, indicates had been giving her Lamictal 225 mg in the morning, 25 mg at bedtime and the correct dose of Zonegran 50 mg twice a day  Update 08/05/22 SS: Brandi Gomez is here today for follow-up.  Last saw Dr. Jannifer Gomez in August 2022.  She had had a recent seizure.  Zonegran was added to Lamictal. Last seizure was August 2022. Her daughter lives with her, manages her medicine.  Denies any side effects.  Is overdue to see her PCP.  Patient doesn't drive. Is on CPAP.  CPAP download shows excellent compliance 30 out of 30 at 100% greater than 4 hours usage.  Average usage 8 hours 59 minutes.  Minimum pressure 7, maximum pressure 12 cm water.  Leak is high at 54.7.  AHI 1.2.  Uses  full facemask.   HISTORY  07/31/2021 Dr. Jannifer Gomez: [name] is a 52 year old right-handed black female with a history of bipolar disorder, diabetes, sleep apnea on CPAP, and intractable seizures.  The patient has a history of a right posterior frontal stroke in the past.  The patient was seen in the emergency room yesterday with a seizure event.  She comes in today with a family member.  The patient apparently took a nap and had a seizure shortly thereafter, she does wear her CPAP during the naps.  The patient has not missed any of her lamotrigine.  She currently is taking 200 mg in the morning and 250 mg in the evening.  A lamotrigine level was checked through the emergency room but is still pending.  Urine drug screen was negative.  The patient has continued to gain weight over time, she is 5 feet 1 inches tall and weighs 317 pounds.  She comes here for further evaluation.  REVIEW OF SYSTEMS: Out of a complete 14 system review of symptoms, the patient complains only of the following symptoms, and all other reviewed systems are negative.  See HPI  ALLERGIES: Allergies  Allergen Reactions   Amoxicillin Itching    Did it involve swelling of the face/tongue/throat, SOB, or low BP? No Did it involve sudden or severe rash/hives, skin peeling, or any reaction on the inside of your mouth or nose? No Did you need to seek medical attention at a hospital or doctor's office? No When did it last happen?      within the past 10 years If all above answers are "NO", may proceed with cephalosporin use.    Lisinopril Swelling    Angioedema    Hydrocodone Other (See Comments)    Depressed    Tegretol [Carbamazepine] Swelling    Throat swells    HOME MEDICATIONS: Outpatient Medications Prior to Visit  Medication Sig Dispense Refill   ACCU-CHEK GUIDE test strip      acetaminophen (TYLENOL) 325 MG tablet Take 2 tablets (650 mg total) by mouth every 4 (four) hours as needed for mild pain (or temp > 37.5 C  (99.5 F)).     ARIPiprazole (ABILIFY) 5 MG tablet Take 5 mg by mouth daily.     atorvastatin (LIPITOR) 40 MG tablet TAKE 1 TABLET (40 MG TOTAL) BY MOUTH DAILY AT 6 PM FOR CHOLESTEROL 90 tablet 0   carvedilol (COREG) 6.25 MG tablet Take 6.25 mg by mouth 2 (two) times daily.     cetirizine (ZYRTEC ALLERGY) 10 MG tablet Take 1 tablet (10 mg total) by mouth daily. 30 tablet 0   ferrous sulfate 325 (65 FE) MG tablet Take 325 mg by mouth 2 (two)  times daily.     folic acid (FOLVITE) 1 MG tablet Take 1 mg by mouth daily.     glipiZIDE (GLUCOTROL) 10 MG tablet Take 10 mg by mouth 2 (two) times daily.     hydrOXYzine (VISTARIL) 25 MG capsule 1 QHS  1 qday PRN 60 capsule 5   metroNIDAZOLE (FLAGYL) 500 MG tablet Take 1 tablet (500 mg total) by mouth 2 (two) times daily. 14 tablet 0   Lamotrigine (LAMICTAL STARTER) 42 x 25 MG & 7 x 100 MG KIT Take 25 mg by mouth daily for 14 days, THEN 50 mg daily for 14 days. 1 kit 0   zonisamide (ZONEGRAN) 50 MG capsule Take 1 capsule (50 mg total) by mouth 2 (two) times daily. 60 capsule 0   hydrALAZINE (APRESOLINE) 10 MG tablet Take 1 tablet (10 mg total) by mouth 2 (two) times daily. 180 tablet 3   isosorbide dinitrate (ISORDIL) 10 MG tablet Take 1 tablet (10 mg total) by mouth 2 (two) times daily. 180 tablet 3   torsemide (DEMADEX) 10 MG tablet Take 10 mg by mouth daily. (Patient not taking: Reported on 02/18/2023)     No facility-administered medications prior to visit.    PAST MEDICAL HISTORY: Past Medical History:  Diagnosis Date   Anemia    Anxiety    Bipolar 1 disorder ([place])    Common migraine 05/19/2015   Depression    Diabetes mellitus, type II ([place])    Hypertension    Irritable bowel syndrome (IBS)    Mild mental retardation    Obesity    Partial complex seizure disorder with intractable epilepsy ([place]) 05/12/2014   Seizures ([place])    intractable, sz 08/23/17   Sleep apnea    Stroke ([place])    Type II or unspecified type diabetes mellitus without  mention of complication, not stated as uncontrolled     PAST SURGICAL HISTORY: Past Surgical History:  Procedure Laterality Date   BUBBLE STUDY  11/15/2019   Procedure: BUBBLE STUDY;  Surgeon: Dorothy Spark, MD;  Location: [place];  Service: Cardiovascular;;   COLONOSCOPY     2012-normal , Dr Sharlett Iles   ESOPHAGOGASTRODUODENOSCOPY     normal-Dr Patterson 2012   [place] N/A 05/30/2017   Procedure: Loop Recorder Insertion;  Surgeon: Constance Haw, MD;  Location: [place] [facility];  Service: Cardiovascular;  Laterality: N/A;   LOOP RECORDER REMOVAL N/A 03/04/2018   Procedure: LOOP RECORDER REMOVAL;  Surgeon: Constance Haw, MD;  Location: [place] [facility];  Service: Cardiovascular;  Laterality: N/A;   MYRINGOTOMY WITH TUBE PLACEMENT     MYRINGOTOMY WITH TUBE PLACEMENT Right 11/05/2017   Procedure: MYRINGOTOMY WITH TUBE PLACEMENT;  Surgeon: Melissa Montane, MD;  Location: [place];  Service: ENT;  Laterality: Right;  right T Tube placement   NASAL SINUS SURGERY     TEE WITHOUT CARDIOVERSION N/A 05/30/2017   Procedure: TRANSESOPHAGEAL ECHOCARDIOGRAM (TEE);  Surgeon: Acie Fredrickson Wonda Cheng, MD;  Location: [facility];  Service: Cardiovascular;  Laterality: N/A;   TEE WITHOUT CARDIOVERSION N/A 11/15/2019   Procedure: TRANSESOPHAGEAL ECHOCARDIOGRAM (TEE);  Surgeon: Dorothy Spark, MD;  Location: [facility] END[facility]COPY;  Service: Cardiovascular;  Laterality: N/A;    FAMILY HISTORY: Family History  Problem Relation Age of Onset   Diabetes Mother        passed away from accidental death   Hypertension Mother    Bipolar disorder Father    Diabetes Daughter    Leukemia Daughter  Ovarian cancer Maternal Aunt     SOCIAL HISTORY: Social History   Socioeconomic History   Marital status: Single    Spouse name: [name]   Number of children: 3   Years of education: 12   Highest education level: Not on file  Occupational History   Occupation: disabled    Employer:  DISABLED  Tobacco Use   Smoking status: Never   Smokeless tobacco: Never  Vaping Use   Vaping Use: Never used  Substance and Sexual Activity   Alcohol use: No   Drug use: No   Sexual activity: Never  Other Topics Concern   Not on file  Social History Narrative   Patient lives at home with daughter.    Patient has 3 children.    Patient is right handed.    Patient has a high school education.    Patient is on disability   Patient drinks 2 cups of caffeine daily.   Social Determinants of Health   Financial Resource Strain: Not on file  Food Insecurity: No Food Insecurity (12/26/2020)   Hunger Vital Sign    Worried About Running Out of Food in the Last Year: Never true    Ran Out of Food in the Last Year: Never true  Transportation Needs: No Transportation Needs (11/06/2020)   PRAPARE - Hydrologist (Medical): No    Lack of Transportation (Non-Medical): No  Physical Activity: Not on file  Stress: Not on file  Social Connections: Not on file  Intimate Partner Violence: Not on file   PHYSICAL EXAM  Vitals:   02/18/23 1248  BP: (!) 145/83  Pulse: 94  Weight: 281 lb 8 oz (127.7 kg)  Height: '5\' 1"'$  (1.549 m)    Body mass index is 53.19 kg/m.  Generalized: Well developed, in no acute distress, obese Neurological examination  Mentation: Alert oriented to time, place, history taking, is a poor historian, relies on family members for history, follows exam commands Cranial nerve II-XII: Pupils were equal round reactive to light. Extraocular movements were full, visual field were full on confrontational test. Facial sensation and strength were normal.  Head turning and shoulder shrug  were normal and symmetric. Motor: The motor testing reveals 5 over 5 strength of all 4 extremities. Good symmetric motor tone is noted throughout.  Sensory: Sensory testing is intact to soft touch on all 4 extremities. No evidence of extinction is noted.  Coordination:  Cerebellar testing reveals good finger-nose-finger and heel-to-shin bilaterally.  Gait and station: Gait is normal, but limited by large body habitus, is independent  DIAGNOSTIC DATA (LABS, IMAGING, TESTING) - I reviewed patient records, labs, notes, testing and imaging myself where available.  Lab Results  Component Value Date   WBC 8.4 08/05/2022   HGB 10.7 (L) 08/05/2022   HCT 33.7 (L) 08/05/2022   MCV 89 08/05/2022   PLT 296 08/05/2022      Component Value Date/Time   NA 135 02/11/2023 0245   NA 140 08/05/2022 0918   K 4.2 02/11/2023 0245   CL 100 02/11/2023 0245   CO2 26 02/11/2023 0245   GLUCOSE 219 (H) 02/11/2023 0245   BUN 12 02/11/2023 0245   BUN 15 08/05/2022 0918   CREATININE 1.43 (H) 02/11/2023 0245   CREATININE 0.72 01/30/2015 1241   CALCIUM 8.7 (L) 02/11/2023 0245   PROT 6.8 08/05/2022 0918   ALBUMIN 3.7 (L) 08/05/2022 0918   AST 10 08/05/2022 0918   ALT 6 08/05/2022 [id number]  ALKPHOS 111 08/05/2022 0918   BILITOT 0.3 08/05/2022 0918   GFRNONAA 44 (L) 02/11/2023 0245   GFRNONAA >89 01/30/2015 1241   GFRAA 56 (L) 01/04/2021 1115   GFRAA >89 01/30/2015 1241   Lab Results  Component Value Date   CHOL 108 04/18/2020   HDL 52 04/18/2020   LDLCALC 44 04/18/2020   TRIG 100 10/31/2020   CHOLHDL 2.1 04/18/2020   Lab Results  Component Value Date   HGBA1C 7.9 (A) 01/18/2021   Lab Results  Component Value Date   [id number] 05/13/2014   Lab Results  Component Value Date   TSH 1.220 02/10/2018    Butler Denmark, AGNP-C, DNP 02/18/2023, 9:00 PM [facility] [address] [place], [place] [address] [phone]

## 2023-02-20 ENCOUNTER — Telehealth: Payer: Self-pay | Admitting: Neurology

## 2023-02-20 NOTE — Telephone Encounter (Signed)
Moshe Salisbury, RN @ [facility]  reports that what Judson Roch, NP wrote pt's Lamictal dose to be is no where near what the pharmacy has filled.  Moshe Salisbury states this needs to be corrected as soon as possible.

## 2023-02-20 NOTE — Telephone Encounter (Signed)
Called and spoke to [place] at [facility], He states that the patients husband picked up the medication yesterday with the correct dosing. And I also called Cara, RN who states she will follow up with patient next to help her organize her medications

## 2023-02-24 ENCOUNTER — Telehealth: Payer: Self-pay | Admitting: Neurology

## 2023-02-24 NOTE — Telephone Encounter (Signed)
Pt is asking the RN calls her re: her new seizure medication(pt does not recall the name of the medication)

## 2023-02-25 ENCOUNTER — Telehealth: Payer: Self-pay | Admitting: Neurology

## 2023-02-25 NOTE — Telephone Encounter (Signed)
Called and spoke to patient who complains of feeling fuzzy headed and confused on the zonegran medication. States these symptoms started before starting the lamictal. I spoke with patient and daughter and they state they are still trying to figure out when home health will be there this week.

## 2023-02-25 NOTE — Telephone Encounter (Signed)
May ask her to stop by this week to review her medication bottles real quick. Myself or Joellen Jersey can help to make sure she is taking correctly.

## 2023-02-25 NOTE — Telephone Encounter (Signed)
Pt called back and said she wants to speak to the RN. She states that she "Is not to impressed with this medication" Please advise.

## 2023-02-25 NOTE — Telephone Encounter (Signed)
Pt would like a call from the nurse to discuss zonisamide (ZONEGRAN) 100 MG capsule. Do not like how the medication makes me feel and makes me think:  Would like a call from the nurse.  Pt could elaborate on how medication makes her feel or think. Could not get her thoughts together.

## 2023-02-25 NOTE — Telephone Encounter (Addendum)
Please call the patient and make sure taking correct dosage for seizures. Hopefully home health has made contact. Thanks

## 2023-02-27 ENCOUNTER — Telehealth: Payer: Self-pay | Admitting: Neurology

## 2023-02-27 NOTE — Telephone Encounter (Signed)
Pt called stating that the home health aid just left her home and advised her to schedule an appt. Pt states that she is wanting to discuss a possible change to the medication.

## 2023-02-27 NOTE — Telephone Encounter (Signed)
Error

## 2023-03-06 NOTE — Telephone Encounter (Signed)
Home health orders faxed to [facility]

## 2023-03-19 ENCOUNTER — Ambulatory Visit (INDEPENDENT_AMBULATORY_CARE_PROVIDER_SITE_OTHER): Payer: Medicare HMO | Admitting: Neurology

## 2023-03-19 ENCOUNTER — Encounter: Payer: Self-pay | Admitting: Neurology

## 2023-03-19 VITALS — BP 136/88 | HR 94 | Ht 67.0 in | Wt 278.5 lb

## 2023-03-19 DIAGNOSIS — G4733 Obstructive sleep apnea (adult) (pediatric): Secondary | ICD-10-CM

## 2023-03-19 DIAGNOSIS — G40909 Epilepsy, unspecified, not intractable, without status epilepticus: Secondary | ICD-10-CM

## 2023-03-19 NOTE — Patient Instructions (Addendum)
Try to split the Zonegran taking 100 mg twice daily (instead of 200 mg at bedtime) see if headache improves. Continue the Lamictal.

## 2023-03-19 NOTE — Progress Notes (Signed)
Patient: Brandi Gomez Date of Birth: 04-30-1971  Reason for Visit: Follow up History from: Patient, significant other Primary Neurologist: Dr. Teresa Coombs seizures/Dr. Athar-OSA  ASSESSMENT AND PLAN 52 y.o. year old female   1.  Seizures -In the ER 02/11/2023 for breakthrough seizure while taking Lamictal 225 mg AM/25 mg PM (supposed to be taking 200/300), Zonegran 50 mg BID (but doubt she was getting this as Zonegran level was not detectable) -I saw her February 18, 2023, her seizure regimen was simplified:  -Zonegran 200 mg at bedtime  -Lamictal titration working up to 200 mg twice a day -Is currently taking above prescribed doses -Will try to switch Zonegran to 100 mg BID to see if less headache -Check labs today -If continues to report headache try Lamictal extended release -Use Nayzilam 5 mg as needed for rescue in acute seizure, sent in 02/18/23 -Completed home health nursing -Call for any seizures, we will follow-up in 6 months or sooner if needed with Dr. Teresa Coombs to establish seizure care   No orders of the defined types were placed in this encounter.  2.  OSA on CPAP -Has superb CPAP compliance, AHI is well treated at 0.9, minimal leak of her mask, she does not notice, will continue current settings and current fullface -Send an order to DME for continued supplies as needed  HISTORY OF PRESENT ILLNESS: Today 03/19/23 Here today with boyfriend. On Zonegran 200 mg at bedtime, Lamictal 200 mg BID. For some reason she thinks the Zonegran is causing headaches, about 1 hour after taking. Home health came out, yesterday was last day. No seizure since last visit. Using CPAP nightly, full face, denies any leak of the mask, no problems with mask. ESS 12. Is compliant with CPAP 100%.  Full download is in separate note.  AHI 0.9.  Some leak 32.6.  Update 02/18/23 SS: Here with her boyfriend Brandi Gomez. In the ER 02/11/2023, reported 10-minute seizure was postictal on EMS arrival.  She  was out of Lamictal likely since Jan per ER. Glucose 219, creatinine 1.43, Lamictal level 4.5, Zonegran was not detected.  Was supposed to be taking Zonegran 50 mg twice a day, Lamictal 200 mg morning/250 mg at bedtime.  She was given Lamictal starter pack. Currently taking Lamictal 25 mg once daily, Zonegran 25 mg 2 tablets daily. Also, had seizure last night, wearing CPAP, jerking all over. No urination or oral injury.  Her CPAP data of the last 30 days looks fine, 30/30 days, greater than 4 hours 28 days.  Minimum pressure 7, maximum 12.  Leak is 20.8.  AHI 0.9.  I called her daughter who manages her medications, indicates had been giving her Lamictal 225 mg in the morning, 25 mg at bedtime and the correct dose of Zonegran 50 mg twice a day  Update 08/05/22 SS: [name] is here today for follow-up.  Last saw Dr. Anne Hahn in August 2022.  She had had a recent seizure.  Zonegran was added to Lamictal. Last seizure was August 2022. Her daughter lives with her, manages her medicine.  Denies any side effects.  Is overdue to see her PCP.  Patient doesn't drive. Is on CPAP.  CPAP download shows excellent compliance 30 out of 30 at 100% greater than 4 hours usage.  Average usage 8 hours 59 minutes.  Minimum pressure 7, maximum pressure 12 cm water.  Leak is high at 54.7.  AHI 1.2.  Uses full facemask.   HISTORY  07/31/2021 Dr. Anne Hahn: Ms.  Scotti is a 52 year old right-handed black female with a history of bipolar disorder, diabetes, sleep apnea on CPAP, and intractable seizures.  The patient has a history of a right posterior frontal stroke in the past.  The patient was seen in the emergency room yesterday with a seizure event.  She comes in today with a family member.  The patient apparently took a nap and had a seizure shortly thereafter, she does wear her CPAP during the naps.  The patient has not missed any of her lamotrigine.  She currently is taking 200 mg in the morning and 250 mg in the evening.  A  lamotrigine level was checked through the emergency room but is still pending.  Urine drug screen was negative.  The patient has continued to gain weight over time, she is 5 feet 1 inches tall and weighs 317 pounds.  She comes here for further evaluation.  REVIEW OF SYSTEMS: Out of a complete 14 system review of symptoms, the patient complains only of the following symptoms, and all other reviewed systems are negative.  See HPI  ALLERGIES: Allergies  Allergen Reactions   Amoxicillin Itching    Did it involve swelling of the face/tongue/throat, SOB, or low BP? No Did it involve sudden or severe rash/hives, skin peeling, or any reaction on the inside of your mouth or nose? No Did you need to seek medical attention at a hospital or doctor's office? No When did it last happen?      within the past 10 years If all above answers are "NO", may proceed with cephalosporin use.    Lisinopril Swelling    Angioedema    Hydrocodone Other (See Comments)    Depressed    Tegretol [Carbamazepine] Swelling    Throat swells    HOME MEDICATIONS: Outpatient Medications Prior to Visit  Medication Sig Dispense Refill   ACCU-CHEK GUIDE test strip      acetaminophen (TYLENOL) 325 MG tablet Take 2 tablets (650 mg total) by mouth every 4 (four) hours as needed for mild pain (or temp > 37.5 C (99.5 F)).     ARIPiprazole (ABILIFY) 5 MG tablet Take 5 mg by mouth daily.     atorvastatin (LIPITOR) 40 MG tablet TAKE 1 TABLET (40 MG TOTAL) BY MOUTH DAILY AT 6 PM FOR CHOLESTEROL 90 tablet 0   carvedilol (COREG) 6.25 MG tablet Take 6.25 mg by mouth 2 (two) times daily.     cetirizine (ZYRTEC ALLERGY) 10 MG tablet Take 1 tablet (10 mg total) by mouth daily. 30 tablet 0   ferrous sulfate 325 (65 FE) MG tablet Take 325 mg by mouth 2 (two) times daily.     folic acid (FOLVITE) 1 MG tablet Take 1 mg by mouth daily.     glipiZIDE (GLUCOTROL) 10 MG tablet Take 10 mg by mouth 2 (two) times daily.     hydrOXYzine (VISTARIL)  25 MG capsule 1 QHS  1 qday PRN 60 capsule 5   lamoTRIgine (LAMICTAL) 100 MG tablet Start taking 1/2 tablet twice daily x 1 week  Then take 1 tablet twice daily x 1 week Then take 1.5 tablets twice daily x 1 week  Then take 2 tablets twice daily (stay at this dose) 70 tablet 3   metroNIDAZOLE (FLAGYL) 500 MG tablet Take 1 tablet (500 mg total) by mouth 2 (two) times daily. 14 tablet 0   zonisamide (ZONEGRAN) 100 MG capsule Take 2 capsules (200 mg total) by mouth at bedtime. 60 capsule 11  hydrALAZINE (APRESOLINE) 10 MG tablet Take 1 tablet (10 mg total) by mouth 2 (two) times daily. 180 tablet 3   isosorbide dinitrate (ISORDIL) 10 MG tablet Take 1 tablet (10 mg total) by mouth 2 (two) times daily. 180 tablet 3   Midazolam (NAYZILAM) 5 MG/0.1ML SOLN Place 5 mg into the nose as needed (1 spray until the nostril for seizure, may repeat additional spray into opposite nostril after 10 minutes if seizures continue). 1 each 1   torsemide (DEMADEX) 10 MG tablet Take 10 mg by mouth daily. (Patient not taking: Reported on 02/18/2023)     No facility-administered medications prior to visit.    PAST MEDICAL HISTORY: Past Medical History:  Diagnosis Date   Anemia    Anxiety    Bipolar 1 disorder    Common migraine 05/19/2015   Depression    Diabetes mellitus, type II    Hypertension    Irritable bowel syndrome (IBS)    Mild mental retardation    Obesity    Partial complex seizure disorder with intractable epilepsy 05/12/2014   Seizures    intractable, sz 08/23/17   Sleep apnea    Stroke    Type II or unspecified type diabetes mellitus without mention of complication, not stated as uncontrolled     PAST SURGICAL HISTORY: Past Surgical History:  Procedure Laterality Date   BUBBLE STUDY  11/15/2019   Procedure: BUBBLE STUDY;  Surgeon: Lars MassonNelson, Katarina H, MD;  [facility];  Service: Cardiovascular;;   COLONOSCOPY     2012-normal , Dr Jarold MottoPatterson   ESOPHAGOGASTRODUODENOSCOPY      normal-Dr Jarold MottoPatterson 2012   LOOP RECORDER INSERTION N/A 05/30/2017   Procedure: Loop Recorder Insertion;  Surgeon: Regan Lemmingamnitz, Will Martin, MD;  Location: [facility] INVASIVE CV LAB;  Service: Cardiovascular;  Laterality: N/A;   LOOP RECORDER REMOVAL N/A 03/04/2018   Procedure: LOOP RECORDER REMOVAL;  Surgeon: Regan Lemmingamnitz, Will Martin, MD;  Location: [facility] INVASIVE CV LAB;  Service: Cardiovascular;  Laterality: N/A;   MYRINGOTOMY WITH TUBE PLACEMENT     MYRINGOTOMY WITH TUBE PLACEMENT Right 11/05/2017   Procedure: MYRINGOTOMY WITH TUBE PLACEMENT;  Surgeon: Suzanna ObeyByers, John, MD;  [facility];  Service: ENT;  Laterality: Right;  right T Tube placement   NASAL SINUS SURGERY     TEE WITHOUT CARDIOVERSION N/A 05/30/2017   Procedure: TRANSESOPHAGEAL ECHOCARDIOGRAM (TEE);  Surgeon: Elease HashimotoNahser, Deloris PingPhilip J, MD;  [facility];  Service: Cardiovascular;  Laterality: N/A;   TEE WITHOUT CARDIOVERSION N/A 11/15/2019   Procedure: TRANSESOPHAGEAL ECHOCARDIOGRAM (TEE);  Surgeon: Lars MassonNelson, Katarina H, MD;  [facility];  Service: Cardiovascular;  Laterality: N/A;    FAMILY HISTORY: Family History  Problem Relation Age of Onset   Diabetes Mother        passed away from accidental death   Hypertension Mother    Bipolar disorder Father    Diabetes Daughter    Leukemia Daughter    Ovarian cancer Maternal Aunt     SOCIAL HISTORY: Social History   Socioeconomic History   Marital status: Single    Spouse name: Chrissie NoaWilliam   Number of children: 3   Years of education: 12   Highest education level: Not on file  Occupational History   Occupation: disabled    Employer: DISABLED  Tobacco Use   Smoking status: Never   Smokeless tobacco: Never  Vaping Use   Vaping Use: Never used  Substance and Sexual Activity   Alcohol use: No   Drug use: No   Sexual activity:  Yes  Other Topics Concern   Not on file  Social History Narrative   Patient lives at home with daughter.    Patient has 3 children.    Patient is right  handed.    Patient has a high school education.    Patient is on disability   Patient drinks 2 cups of caffeine daily.   Social Determinants of Health   Financial Resource Strain: Not on file  Food Insecurity: No Food Insecurity (12/26/2020)   Hunger Vital Sign    Worried About Running Out of Food in the Last Year: Never true    Ran Out of Food in the Last Year: Never true  Transportation Needs: No Transportation Needs (11/06/2020)   PRAPARE - Administrator, Civil Service (Medical): No    Lack of Transportation (Non-Medical): No  Physical Activity: Not on file  Stress: Not on file  Social Connections: Not on file  Intimate Partner Violence: Not on file   PHYSICAL EXAM  Vitals:   03/19/23 0914  BP: 136/88  Pulse: 94  Weight: 278 lb 8 oz (126.3 kg)  Height: 5\' 7"  (1.702 m)   Body mass index is 43.62 kg/m.  Generalized: Well developed, in no acute distress, obese Neurological examination  Mentation: Alert oriented to time, place, history taking, is a poor historian, relies on family members for history, follows exam commands Cranial nerve II-XII: Pupils were equal round reactive to light. Extraocular movements were full, visual field were full on confrontational test. Facial sensation and strength were normal.  Head turning and shoulder shrug  were normal and symmetric. Motor: The motor testing reveals 5 over 5 strength of all 4 extremities. Good symmetric motor tone is noted throughout.  Sensory: Sensory testing is intact to soft touch on all 4 extremities. No evidence of extinction is noted.  Coordination: Cerebellar testing reveals good finger-nose-finger and heel-to-shin bilaterally.  Gait and station: Gait is normal, but limited by large body habitus, is independent  DIAGNOSTIC DATA (LABS, IMAGING, TESTING) - I reviewed patient records, labs, notes, testing and imaging myself where available.  Lab Results  Component Value Date   WBC 8.4 08/05/2022   HGB  10.7 (L) 08/05/2022   HCT 33.7 (L) 08/05/2022   MCV 89 08/05/2022   PLT 296 08/05/2022      Component Value Date/Time   NA 135 02/11/2023 0245   NA 140 08/05/2022 0918   K 4.2 02/11/2023 0245   CL 100 02/11/2023 0245   CO2 26 02/11/2023 0245   GLUCOSE 219 (H) 02/11/2023 0245   BUN 12 02/11/2023 0245   BUN 15 08/05/2022 0918   CREATININE 1.43 (H) 02/11/2023 0245   CREATININE 0.72 01/30/2015 1241   CALCIUM 8.7 (L) 02/11/2023 0245   PROT 6.8 08/05/2022 0918   ALBUMIN 3.7 (L) 08/05/2022 0918   AST 10 08/05/2022 0918   ALT 6 08/05/2022 0918   ALKPHOS 111 08/05/2022 0918   BILITOT 0.3 08/05/2022 0918   GFRNONAA 44 (L) 02/11/2023 0245   GFRNONAA >89 01/30/2015 1241   GFRAA 56 (L) 01/04/2021 1115   GFRAA >89 01/30/2015 1241   Lab Results  Component Value Date   CHOL 108 04/18/2020   HDL 52 04/18/2020   LDLCALC 44 04/18/2020   TRIG 100 10/31/2020   CHOLHDL 2.1 04/18/2020   Lab Results  Component Value Date   HGBA1C 7.9 (A) 01/18/2021   Lab Results  Component Value Date   VITAMINB12 379 05/13/2014   Lab Results  Component Value Date   TSH 1.220 02/10/2018    Margie Ege, AGNP-C, DNP 03/19/2023, 9:19 AM [facility] [address], [address] [place], [place] [address] [phone]

## 2023-03-20 LAB — CBC WITH DIFFERENTIAL/PLATELET
Basophils Absolute: 0 10*3/uL (ref 0.0–0.2)
Basos: 1 %
EOS (ABSOLUTE): 0.2 10*3/uL (ref 0.0–0.4)
Eos: 3 %
Hematocrit: 34.6 % (ref 34.0–46.6)
Hemoglobin: 11.4 g/dL (ref 11.1–15.9)
Immature Grans (Abs): 0 10*3/uL (ref 0.0–0.1)
Immature Granulocytes: 0 %
Lymphocytes Absolute: 2.5 10*3/uL (ref 0.7–3.1)
Lymphs: 31 %
MCH: 30.1 pg (ref 26.6–33.0)
MCHC: 32.9 g/dL (ref 31.5–35.7)
MCV: 91 fL (ref 79–97)
Monocytes Absolute: 0.4 10*3/uL (ref 0.1–0.9)
Monocytes: 5 %
Neutrophils Absolute: 4.7 10*3/uL (ref 1.4–7.0)
Neutrophils: 60 %
Platelets: 270 10*3/uL (ref 150–450)
RBC: 3.79 x10E6/uL (ref 3.77–5.28)
RDW: 14.2 % (ref 11.7–15.4)
WBC: 7.8 10*3/uL (ref 3.4–10.8)

## 2023-03-20 LAB — COMPREHENSIVE METABOLIC PANEL
ALT: 9 IU/L (ref 0–32)
AST: 19 IU/L (ref 0–40)
Albumin/Globulin Ratio: 1.3 (ref 1.2–2.2)
Albumin: 4 g/dL (ref 3.8–4.9)
Alkaline Phosphatase: 104 IU/L (ref 44–121)
BUN/Creatinine Ratio: 14 (ref 9–23)
BUN: 18 mg/dL (ref 6–24)
Bilirubin Total: 0.4 mg/dL (ref 0.0–1.2)
CO2: 20 mmol/L (ref 20–29)
Calcium: 9 mg/dL (ref 8.7–10.2)
Chloride: 103 mmol/L (ref 96–106)
Creatinine, Ser: 1.32 mg/dL — ABNORMAL HIGH (ref 0.57–1.00)
Globulin, Total: 3.1 g/dL (ref 1.5–4.5)
Glucose: 163 mg/dL — ABNORMAL HIGH (ref 70–99)
Potassium: 3.7 mmol/L (ref 3.5–5.2)
Sodium: 139 mmol/L (ref 134–144)
Total Protein: 7.1 g/dL (ref 6.0–8.5)
eGFR: 48 mL/min/{1.73_m2} — ABNORMAL LOW (ref >59)

## 2023-03-20 LAB — LAMOTRIGINE LEVEL: Lamotrigine Lvl: 3.6 ug/mL (ref 2.0–20.0)

## 2023-03-20 LAB — ZONISAMIDE LEVEL: Zonisamide: 18.5 ug/mL (ref 10.0–40.0)

## 2023-03-24 ENCOUNTER — Telehealth: Payer: Self-pay

## 2023-03-24 NOTE — Telephone Encounter (Signed)
Community msg sent through epic to [facility] team members regarding new cpap orders

## 2023-03-24 NOTE — Telephone Encounter (Signed)
Community msg sent for cpap order per [employer]

## 2023-03-25 ENCOUNTER — Other Ambulatory Visit: Payer: Self-pay

## 2023-03-25 ENCOUNTER — Emergency Department (HOSPITAL_COMMUNITY)
Admission: EM | Admit: 2023-03-25 | Discharge: 2023-03-25 | Disposition: A | Discharge status: Home / Self Care | Payer: Medicare HMO | Attending: Emergency Medicine | Admitting: Emergency Medicine

## 2023-03-25 DIAGNOSIS — R569 Unspecified convulsions: Secondary | ICD-10-CM | POA: Insufficient documentation

## 2023-03-25 DIAGNOSIS — D539 Nutritional anemia, unspecified: Secondary | ICD-10-CM | POA: Insufficient documentation

## 2023-03-25 LAB — CBC WITH DIFFERENTIAL/PLATELET
Abs Immature Granulocytes: 0.04 10*3/uL (ref 0.00–0.07)
Basophils Absolute: 0 10*3/uL (ref 0.0–0.1)
Basophils Relative: 0 %
Eosinophils Absolute: 0.2 10*3/uL (ref 0.0–0.5)
Eosinophils Relative: 3 %
HCT: 36.4 % (ref 36.0–46.0)
Hemoglobin: 11.3 g/dL — ABNORMAL LOW (ref 12.0–15.0)
Immature Granulocytes: 1 %
Lymphocytes Relative: 34 %
Lymphs Abs: 2.5 10*3/uL (ref 0.7–4.0)
MCH: 30.1 pg (ref 26.0–34.0)
MCHC: 31 g/dL (ref 30.0–36.0)
MCV: 96.8 fL (ref 80.0–100.0)
Monocytes Absolute: 0.5 10*3/uL (ref 0.1–1.0)
Monocytes Relative: 7 %
Neutro Abs: 4.1 10*3/uL (ref 1.7–7.7)
Neutrophils Relative %: 55 %
Platelets: 263 10*3/uL (ref 150–400)
RBC: 3.76 MIL/uL — ABNORMAL LOW (ref 3.87–5.11)
RDW: 15 % (ref 11.5–15.5)
WBC: 7.3 10*3/uL (ref 4.0–10.5)
nRBC: 0 % (ref 0.0–0.2)

## 2023-03-25 LAB — COMPREHENSIVE METABOLIC PANEL
ALT: 11 U/L (ref 0–44)
AST: 15 U/L (ref 15–41)
Albumin: 3.6 g/dL (ref 3.5–5.0)
Alkaline Phosphatase: 84 U/L (ref 38–126)
Anion gap: 7 (ref 5–15)
BUN: 15 mg/dL (ref 6–20)
CO2: 24 mmol/L (ref 22–32)
Calcium: 8.6 mg/dL — ABNORMAL LOW (ref 8.9–10.3)
Chloride: 105 mmol/L (ref 98–111)
Creatinine, Ser: 1.33 mg/dL — ABNORMAL HIGH (ref 0.44–1.00)
GFR, Estimated: 48 mL/min — ABNORMAL LOW (ref >60)
Glucose, Bld: 148 mg/dL — ABNORMAL HIGH (ref 70–99)
Potassium: 3.6 mmol/L (ref 3.5–5.1)
Sodium: 136 mmol/L (ref 135–145)
Total Bilirubin: 0.3 mg/dL (ref 0.3–1.2)
Total Protein: 7.4 g/dL (ref 6.5–8.1)

## 2023-03-25 LAB — MAGNESIUM: Magnesium: 2 mg/dL (ref 1.7–2.4)

## 2023-03-25 LAB — ETHANOL: Alcohol, Ethyl (B): <10 mg/dL (ref <10)

## 2023-03-25 MED ORDER — LAMOTRIGINE 100 MG PO TABS
100.0000 mg | ORAL_TABLET | Freq: Once | ORAL | Status: AC
  Administered 2023-03-25: 100 mg via ORAL
  Filled 2023-03-25: qty 1

## 2023-03-25 MED ORDER — ZONISAMIDE 25 MG PO CAPS
50.0000 mg | ORAL_CAPSULE | Freq: Once | ORAL | Status: AC
  Administered 2023-03-25: 50 mg via ORAL
  Filled 2023-03-25: qty 2

## 2023-03-25 NOTE — ED Triage Notes (Signed)
Patient BIB EMS from home c/o unwitnessed seizures. Per report family stated seizure lasted for 20-30 mins. Per report pt had new medication change last week. No report of N/V.  Hx of Epilepsy.

## 2023-03-25 NOTE — Discharge Instructions (Signed)
Call your Neurologist tomorrow for further recommendations on potential medication changes.  If you have another seizure, develop a fever, headache, or any other new/concerning symptoms then return to the ER or call 911

## 2023-03-25 NOTE — ED Provider Notes (Signed)
[place] EMERGENCY DEPARTMENT AT [facility] Provider Note   CSN: [id number] Arrival date & time: 03/25/23  1814     History  Chief Complaint  Patient presents with   Seizures    Brandi Gomez is a 52 y.o. female.  HPI 52 year old female presents after a seizure.  She has a known seizure disorder history and reportedly has been compliant with her seizure meds.  She states she has some memory problems that she is not sure how she has been doing on her meds but states that her daughter provides her her meds.  She knows she had a seizure because her daughter told her but otherwise does not remember what happened.  She right now feels fine and denies headache, acute illness, or any other acute complaints.  Home Medications Prior to Admission medications   Medication Sig Start Date End Date Taking? Authorizing Provider  ACCU-CHEK GUIDE test strip  04/27/22   [provider]  acetaminophen (TYLENOL) 325 MG tablet Take 2 tablets (650 mg total) by mouth every 4 (four) hours as needed for mild pain (or temp > 37.5 C (99.5 F)). 10/19/19   Angiulli, Mcarthur Rossetti, PA-C  ARIPiprazole (ABILIFY) 5 MG tablet Take 5 mg by mouth daily.    [provider]  atorvastatin (LIPITOR) 40 MG tablet TAKE 1 TABLET (40 MG TOTAL) BY MOUTH DAILY AT 6 PM FOR CHOLESTEROL 09/24/21   Sagardia, Eilleen Kempf, MD  carvedilol (COREG) 6.25 MG tablet Take 6.25 mg by mouth 2 (two) times daily. 05/20/21   [provider]  cetirizine (ZYRTEC ALLERGY) 10 MG tablet Take 1 tablet (10 mg total) by mouth daily. 02/15/22   Piontek, Denny Peon, MD  ferrous sulfate 325 (65 FE) MG tablet Take 325 mg by mouth 2 (two) times daily. 07/10/22   [provider]  folic acid (FOLVITE) 1 MG tablet Take 1 mg by mouth daily. 03/04/22   [provider]  glipiZIDE (GLUCOTROL) 10 MG tablet Take 10 mg by mouth 2 (two) times daily. 04/27/22   [provider]  hydrALAZINE (APRESOLINE) 10 MG tablet  Take 1 tablet (10 mg total) by mouth 2 (two) times daily. 01/04/20 07/31/21  Georgina Quint, MD  hydrOXYzine (VISTARIL) 25 MG capsule 1 QHS  1 qday PRN 02/12/23   Archer Asa, MD  isosorbide dinitrate (ISORDIL) 10 MG tablet Take 1 tablet (10 mg total) by mouth 2 (two) times daily. 01/04/20 07/31/21  Georgina Quint, MD  lamoTRIgine (LAMICTAL) 100 MG tablet Start taking 1/2 tablet twice daily x 1 week  Then take 1 tablet twice daily x 1 week Then take 1.5 tablets twice daily x 1 week  Then take 2 tablets twice daily (stay at this dose) 02/18/23   Glean Salvo, NP  metroNIDAZOLE (FLAGYL) 500 MG tablet Take 1 tablet (500 mg total) by mouth 2 (two) times daily. 09/13/21   Lamptey, Britta Mccreedy, MD  zonisamide (ZONEGRAN) 100 MG capsule Take 2 capsules (200 mg total) by mouth at bedtime. 02/18/23   Glean Salvo, NP  methylphenidate (RITALIN) 5 MG tablet Take 1 tablet (5 mg total) by mouth 2 (two) times daily. Patient not taking: Reported on 12/29/2019 10/29/19 01/01/20  Horton Chin, MD  traZODone (DESYREL) 100 MG tablet Take 1 tablet (100 mg total) by mouth at bedtime. Patient not taking: Reported on 12/29/2019 10/19/19 01/01/20  Angiulli, Mcarthur Rossetti, PA-C      Allergies    Amoxicillin, Lisinopril, Hydrocodone, and  Tegretol [carbamazepine]    Review of Systems   Review of Systems  Constitutional:  Negative for fever.  Gastrointestinal:  Negative for vomiting.  Musculoskeletal:  Negative for neck pain.  Neurological:  Positive for seizures. Negative for headaches.    Physical Exam Updated Vital Signs BP 132/76   Pulse 81   Temp 98.5 F (36.9 C) (Oral)   Resp (!) 21   Ht 5\' 7"  (1.702 m)   Wt 123.6 kg   LMP  (LMP Unknown)   SpO2 96%   BMI 42.68 kg/m  Physical Exam Vitals and nursing note reviewed.  Constitutional:      General: She is not in acute distress.    Appearance: She is well-developed. She is obese. She is not ill-appearing or diaphoretic.  HENT:     Head:  Normocephalic and atraumatic.  Eyes:     Extraocular Movements: Extraocular movements intact.     Pupils: Pupils are equal, round, and reactive to light.  Cardiovascular:     Rate and Rhythm: Normal rate and regular rhythm.     Heart sounds: Normal heart sounds.  Pulmonary:     Effort: Pulmonary effort is normal.     Breath sounds: Normal breath sounds.  Musculoskeletal:     Cervical back: Normal range of motion. No rigidity.  Skin:    General: Skin is warm and dry.  Neurological:     Mental Status: She is alert and oriented to person, place, and time.     Comments: CN 3-12 grossly intact. 5/5 strength in all 4 extremities. Grossly normal sensation. Normal finger to nose.      ED Results / Procedures / Treatments   Labs (all labs ordered are listed, but only abnormal results are displayed) Labs Reviewed  COMPREHENSIVE METABOLIC PANEL - Abnormal; Notable for the following components:      Result Value   Glucose, Bld 148 (*)    Creatinine, Ser 1.33 (*)    Calcium 8.6 (*)    GFR, Estimated 48 (*)    All other components within normal limits  CBC WITH DIFFERENTIAL/PLATELET - Abnormal; Notable for the following components:   RBC 3.76 (*)    Hemoglobin 11.3 (*)    All other components within normal limits  MAGNESIUM  ETHANOL  RAPID URINE DRUG SCREEN, HOSP PERFORMED  LAMOTRIGINE LEVEL  ZONISAMIDE LEVEL  CBG MONITORING, ED    EKG EKG Interpretation  Date/Time:  Tuesday March 25 2023 18:34:56 EDT Ventricular Rate:  85 PR Interval:  200 QRS Duration: 80 QT Interval:  360 QTC Calculation: 428 R Axis:   90 Text Interpretation: Sinus rhythm Borderline right axis deviation Low voltage, precordial leads Nonspecific T abnrm, anterolateral leads no significant change since Aug 2022 Confirmed by Pricilla Loveless [phone]) on 03/25/2023 8:06:22 PM  Radiology No results found.  Procedures Procedures    Medications Ordered in ED Medications  zonisamide (ZONEGRAN) capsule 50 mg  (50 mg Oral Given 03/25/23 2056)  lamoTRIgine (LAMICTAL) tablet 100 mg (100 mg Oral Given 03/25/23 2056)    ED Course/ Medical Decision Making/ A&P                             Medical Decision Making Amount and/or Complexity of Data Reviewed Labs: ordered.    Details: Normal WBC.  Chronic anemia.  No significant electrolyte disturbance compared to baseline. ECG/medicine tests: ordered and independent interpretation performed.    Details: No acute ischemia.  Risk  Prescription drug management.   Patient presents with a breakthrough seizure.  No seizure-like activity in multiple hours in the emergency department.  She was given her evening doses of meds for seizure per what her family and close friend Kingsley Callander described.  Otherwise, she is not altered and appears well and I believe is unlikely to have an acute CNS emergency.  I do not think she needs CT imaging, MRI imaging, or LP.  Will discharge home with return precautions and advised her to follow-up with neurology for potential medication changes.  Levels of her Lamictal and Zonegran were sent but will not come back tonight.        Final Clinical Impression(s) / ED Diagnoses Final diagnoses:  Seizure    Rx / DC Orders ED Discharge Orders     None         Pricilla Loveless, MD 03/25/23 2342

## 2023-03-26 ENCOUNTER — Telehealth: Payer: Self-pay | Admitting: Neurology

## 2023-03-26 MED ORDER — ZONISAMIDE 100 MG PO CAPS: 100.0000 mg | ORAL_CAPSULE | Freq: Two times a day (BID) | ORAL | 3 refills | Status: DC

## 2023-03-26 MED ORDER — LAMOTRIGINE 100 MG PO TABS: 250.0000 mg | ORAL_TABLET | Freq: Two times a day (BID) | ORAL | 3 refills | Status: DC

## 2023-03-26 NOTE — Telephone Encounter (Signed)
Called and clarified dosing of medications with patient. Pt verbalized understanding. Pt had no questions at this time but was encouraged to call back if questions arise.

## 2023-03-26 NOTE — Telephone Encounter (Signed)
Pt called needing for the RN to call her back to clear up a confusion the she and the caretaker have with her medications. Please advise.

## 2023-03-26 NOTE — Telephone Encounter (Signed)
I called the patient.  She is really trying hard with her medications.  Seems to be taking correctly Zonegran 100 mg twice a day, Lamictal 200 mg twice a day.  Was in the ER for seizure yesterday, was compliant with her medications.  Described generalized seizure witnessed by her daughter around 4 PM.  She was due for her evening dose of medicine around 6 PM.  Lamictal level is pending, labs overall stable, sodium 136, creatinine 1.33, glucose 148.  To try and make things easy as, I will increase Lamictal 250 milligrams twice daily, continue Zonegran 100 mg twice a day.  Breaking up the Zonegran has relieved headache, complained of headache when taking Zonegran 200 mg at bedtime.  Call for issues, Lamictal level 8.8.  Meds ordered this encounter  Medications   lamoTRIgine (LAMICTAL) 100 MG tablet    Sig: Take 2.5 tablets (250 mg total) by mouth 2 (two) times daily.    Dispense:  450 tablet    Refill:  3    This is the newest dosing   zonisamide (ZONEGRAN) 100 MG capsule    Sig: Take 1 capsule (100 mg total) by mouth 2 (two) times daily.    Dispense:  180 capsule    Refill:  3

## 2023-03-27 LAB — LAMOTRIGINE LEVEL: Lamotrigine Lvl: 8.8 ug/mL (ref 2.0–20.0)

## 2023-03-31 ENCOUNTER — Telehealth (HOSPITAL_COMMUNITY): Payer: Self-pay | Admitting: *Deleted

## 2023-03-31 NOTE — Telephone Encounter (Signed)
Patient called  Brandi Gomez stating that  she had paper work that she needed to drop -off .  Attempted call back to get more information && phone just rung continuously . NO VM   Patient of Dr Donell Beers-- Last  seen :: 02/12/23   NEXT appt::  05/21/23

## 2023-04-10 ENCOUNTER — Telehealth: Payer: Self-pay | Admitting: Neurology

## 2023-04-10 NOTE — Telephone Encounter (Signed)
I am not clear this represents seizure activity. I would advise if she is napping to wear her CPAP, as she has been found to have severe OSA. Ensure she is taking Lamictal 250 mg BID, Zonegran 100 mg. Continue to monitor. Thanks

## 2023-04-10 NOTE — Telephone Encounter (Signed)
New orders  sent via community message

## 2023-04-10 NOTE — Telephone Encounter (Signed)
Pt has called back asking that the following be added to her message from a few mins ago.  Pt reports that she is unsure if she had a seizure yesterday around 3 or if she dreamed that she had one.  Phone rep asked how she felt when she woke up this morning, pt states she feels fine.  Pt wanted this added in hopes that it will put the RN's response time to her message quicker.

## 2023-04-10 NOTE — Telephone Encounter (Signed)
Pt called requesting to speak to an RN to discuss an issue with her lamoTRIgine (LAMICTAL) 100 MG tablet and her zonisamide (ZONEGRAN) 100 MG capsule. Please advise.

## 2023-04-10 NOTE — Telephone Encounter (Signed)
Return call to patient who reports excessive sleepiness beginning around 3 pm and falls into deep sleep and wakes up confused but returns to baseline shortly after. Not happening daily but has happened several times. She isn't sure if this is seizure activity but reports that it did start after starting the zonegran. Advise would inform provider and someone would follow up

## 2023-04-10 NOTE — Telephone Encounter (Signed)
We can increase her pressure 7-14 cm water. Please let DME aware to make the change. Please call for any issues. Will need to get her back for designated CPAP visit, can you schedule something with me at my next available in 4-6 months. I will remind myself in 4-6 weeks to pull another download. Her mask is leaking slightly as well, make sure tight and fitting properly, otherwise we can do mask refit. Thanks

## 2023-04-10 NOTE — Telephone Encounter (Signed)
Return call to patient to verify that she is wearing CPAP for nighttime sleep and napping. She reports she is. Reviewed medications and she reports she is only taking 200 mg Lamictal in the morning due to it causing sleepiness but is taking 250 mg at night and the zonegran 100 mg twice daily. Advised I will inform Maralyn Sago of dose change per patient but that is what she recommends for seizure control. Will update and advise as needed.  3:35 pm call and advised patient to take medications as prescribed and as close to 12 hrs apart as possible. Patient verbalized understanding. Patient did states she felt like  she could use a little more pressure on hr CPAP machine. Will update provider. No other concerns at this time.

## 2023-05-15 ENCOUNTER — Telehealth: Payer: Self-pay | Admitting: Neurology

## 2023-05-15 NOTE — Telephone Encounter (Signed)
Call to patient with CPAP download report, patient states she feels like everything is going fine, her pressure is fine and mask is fitting well. She denies needs for changes ir adjustments at this time and will call office if anything changes. Appreciative of call.

## 2023-05-15 NOTE — Telephone Encounter (Signed)
Please call, I pulled a recent CPAP download below, overall compliance looks superb.  I increased her pressure a month ago to 7-14.  Her AHI looks very good at 0.8.  She has minimal mask leak at 29.8.  We can continue to offer a mask refit if needed.  Otherwise the data looks good.  I could also increase the pressure slightly more if she feels she needs more pressure.  Looks like she is maxing out at 53.8.

## 2023-05-17 ENCOUNTER — Other Ambulatory Visit: Payer: Self-pay | Admitting: Neurology

## 2023-05-19 ENCOUNTER — Telehealth: Payer: Self-pay | Admitting: Neurology

## 2023-05-19 ENCOUNTER — Telehealth: Payer: Self-pay

## 2023-05-19 NOTE — Telephone Encounter (Signed)
Pt requesting a call from the nurse, states she has had a problem in her sleep and would only like to go into detail with a nurse.

## 2023-05-19 NOTE — Telephone Encounter (Signed)
error 

## 2023-05-19 NOTE — Telephone Encounter (Signed)
Patient returned call to report that she woke from her sleep thinking she was going to be late for school, and she is 52 years old. She states she was coughing despite having her CPAP on, but she also has noticed an increase in her memory issues and sometimes having trouble finding the right words to say and this often gives her trouble when she writes as well.

## 2023-05-19 NOTE — Telephone Encounter (Signed)
Called and LVM rq CB 

## 2023-05-20 ENCOUNTER — Telehealth: Payer: Self-pay

## 2023-05-20 MED ORDER — ZONISAMIDE 100 MG PO CAPS: 100.0000 mg | ORAL_CAPSULE | Freq: Two times a day (BID) | ORAL | 3 refills | Status: DC

## 2023-05-20 MED ORDER — LAMOTRIGINE 100 MG PO TABS: ORAL_TABLET | ORAL | 3 refills | Status: DC

## 2023-05-20 MED ORDER — LAMOTRIGINE 100 MG PO TABS: ORAL_TABLET | ORAL | 1 refills | Status: DC

## 2023-05-20 NOTE — Telephone Encounter (Signed)
Call to patient to clarify medication she is taking, she is unable to tell me the names of medications but states she " takes one of one of the medicines in the morning and  at night" and then states that she takes one of the other in the morning and at night". Advised patient I will call pharmacy to verify dose that filled, follow up with daughter and Maralyn Sago, NP and call her back.   Call to [facility], she states 03/26/23 patient picked up 100 mg lamotrigine ( take 2.5 tabs twice a day for total of 250mg  twice daily), She states they do not offer bubble packs and lamotrigine doesn't come in a 250mg  tablet.  Advised we may switch those 2 medications to different pharmacy that offers bubble pack. Advised not fill any scripts for the zonegran or lamotrigine.  Call to Daughter Ms. Fayrene Fearing, no answer left message to return call.  Daughter Ms. Fayrene Fearing called back. I reviewed medications lamotrigine and zonegran and dosing and frequency. Daughter verbalized understanding and stated she wasn't sure if she was getting that because her other sister that lives in the home is helping with medications. Daughter agreed that we can have medications sent over to [facility] for bubble packs/delivery to make medication administration easier and less room for error. She understands she may need to contact them with insurance information. Daughter also states she wants to hold off on ambulatory EEG for now and will let us know if she changes her mind.  Maralyn Sago, NP updated and in agreement with plan. I will call [facility] when medications are sent to ask for bubble packs.

## 2023-05-20 NOTE — Addendum Note (Signed)
Addended by: Glean Salvo on: 05/20/2023 10:45 AM   Modules accepted: Orders

## 2023-05-20 NOTE — Telephone Encounter (Signed)
Her memory issues are not new.  Would recommend monitoring this isolated event from sleep.  I looked at her CPAP data, the data looks good.If she is having frequent dreams out of sleep, concern for seizures. I can order overnight ambulatory video EEG. Thanks  Can we check on her Lamictal, supposed to be on Lamictal 250 mg BID, Zonegran 100 mg twice daily, but at one time she was only taking 100 mg daily.Marland Kitchen

## 2023-05-20 NOTE — Telephone Encounter (Signed)
April will call her about doing eeg

## 2023-05-20 NOTE — Telephone Encounter (Signed)
I accidentally sent in for the taper April went in and discontinued the one I did accidentally and we ordered for the 250mg  bid so 2 .5tabs.  Thanks,  [employer]

## 2023-05-20 NOTE — Telephone Encounter (Signed)
Call to [facility] at [facility], Daughter Katheran Awe phone given so intake could call and get info and discuss pill packs.

## 2023-05-20 NOTE — Telephone Encounter (Signed)
Will send medications to [facility] to be filled as pill packs.   Meds ordered this encounter  Medications   lamoTRIgine (LAMICTAL) 100 MG tablet    Sig: Take two and a half tablets twice a day    Dispense:  450 tablet    Refill:  3    Please dispense as pill pack   zonisamide (ZONEGRAN) 100 MG capsule    Sig: Take 1 capsule (100 mg total) by mouth 2 (two) times daily.    Dispense:  180 capsule    Refill:  3    Please dispense as pill pack

## 2023-05-21 ENCOUNTER — Ambulatory Visit (HOSPITAL_COMMUNITY): Payer: Medicare HMO | Admitting: Psychiatry

## 2023-06-30 ENCOUNTER — Telehealth: Payer: Self-pay | Admitting: Neurology

## 2023-06-30 DIAGNOSIS — G40909 Epilepsy, unspecified, not intractable, without status epilepticus: Secondary | ICD-10-CM

## 2023-06-30 NOTE — Telephone Encounter (Signed)
Please clarify which medication she equates with giving her problems? Is the headache 1st thing in the morning after taking her medications? We could try switching Lamictal to XR 250 mg, 2 tablets at bedtime. Let's have her come in to check lab levels of Lamictal and Zonegran before we make any changes. I will order the labs. Thanks   Orders Placed This Encounter  Procedures   Lamotrigine level   Zonisamide level   CMP   CBC with Differential/Platelet   Vitamin D, 25-hydroxy

## 2023-06-30 NOTE — Telephone Encounter (Signed)
Called and spoke to patient about medications, according to note and paper patient read about her schedule made by daughter for medications, she is taking correctly. She now complains of headache, confusion and sleepiness, could not give a time frame for how long this has been going on, wants to know if dosing should be decreased at all or if something else will help side effects. She states she has not started any other medications that she knows of.

## 2023-06-30 NOTE — Telephone Encounter (Signed)
Called patient, she report headaches are sometimes in the morning and sometimes in the evening, there is not particular time she has them, I informed her of the labs and she states she will come by in the next few days to have them drawn.

## 2023-06-30 NOTE — Addendum Note (Signed)
Addended by: Glean Salvo on: 06/30/2023 11:07 AM   Modules accepted: Orders

## 2023-06-30 NOTE — Telephone Encounter (Signed)
Pt is asking for a call from RN to discuss her dosage for both lamoTRIgine (LAMICTAL) 150 MG tablet &  zonisamide (ZONEGRAN) 100 MG capsule

## 2023-07-02 ENCOUNTER — Other Ambulatory Visit (INDEPENDENT_AMBULATORY_CARE_PROVIDER_SITE_OTHER): Payer: Self-pay

## 2023-07-02 ENCOUNTER — Telehealth: Payer: Self-pay | Admitting: Neurology

## 2023-07-02 DIAGNOSIS — G40909 Epilepsy, unspecified, not intractable, without status epilepticus: Secondary | ICD-10-CM

## 2023-07-02 DIAGNOSIS — Z0289 Encounter for other administrative examinations: Secondary | ICD-10-CM

## 2023-07-02 NOTE — Telephone Encounter (Signed)
Pt came into the office for labs. Requesting a call from nurse concerning medications;  Ferrous sulfate 325 mc  Januvia 100mg  Torsenide 10mg  tablets  Carveidilol 6.25mg  Atorvastatin 40mg   States she just started taking all these medications again 2 days ago and is experiences side effects. Is having "seizure like side effects"  confusion, headaches stuttering and states she was having more side effects she could not think of at the moment. Would like to discuss [phone]

## 2023-07-02 NOTE — Telephone Encounter (Signed)
Contacted the patient to informed of nurse's previous note.

## 2023-07-03 ENCOUNTER — Telehealth: Payer: Self-pay | Admitting: Neurology

## 2023-07-03 LAB — CBC WITH DIFFERENTIAL/PLATELET
Basophils Absolute: 0 10*3/uL (ref 0.0–0.2)
EOS (ABSOLUTE): 0.2 10*3/uL (ref 0.0–0.4)
Eos: 4 %
Hemoglobin: 11.7 g/dL (ref 11.1–15.9)
Immature Grans (Abs): 0 10*3/uL (ref 0.0–0.1)
Immature Granulocytes: 0 %
Lymphocytes Absolute: 1.8 10*3/uL (ref 0.7–3.1)
Lymphs: 30 %
MCH: 31.6 pg (ref 26.6–33.0)
MCHC: 32.5 g/dL (ref 31.5–35.7)
MCV: 97 fL (ref 79–97)
Monocytes: 7 %
Neutrophils Absolute: 3.6 10*3/uL (ref 1.4–7.0)
Neutrophils: 58 %
Platelets: 318 10*3/uL (ref 150–450)
RBC: 3.7 x10E6/uL — ABNORMAL LOW (ref 3.77–5.28)
RDW: 13.8 % (ref 11.7–15.4)
WBC: 6 10*3/uL (ref 3.4–10.8)

## 2023-07-03 LAB — COMPREHENSIVE METABOLIC PANEL
AST: 12 IU/L (ref 0–40)
Albumin: 4.2 g/dL (ref 3.8–4.9)
Alkaline Phosphatase: 117 IU/L (ref 44–121)
BUN: 17 mg/dL (ref 6–24)
Bilirubin Total: 0.3 mg/dL (ref 0.0–1.2)
CO2: 20 mmol/L (ref 20–29)
Calcium: 9.2 mg/dL (ref 8.7–10.2)
Chloride: 102 mmol/L (ref 96–106)
Glucose: 150 mg/dL — ABNORMAL HIGH (ref 70–99)
Potassium: 3.7 mmol/L (ref 3.5–5.2)
Sodium: 139 mmol/L (ref 134–144)
Total Protein: 7.3 g/dL (ref 6.0–8.5)
eGFR: 43 mL/min/{1.73_m2} — ABNORMAL LOW (ref >59)

## 2023-07-03 LAB — VITAMIN D 25 HYDROXY (VIT D DEFICIENCY, FRACTURES): Vit D, 25-Hydroxy: 11.1 ng/mL — ABNORMAL LOW (ref 30.0–100.0)

## 2023-07-03 LAB — ZONISAMIDE LEVEL: Zonisamide: 16.4 ug/mL (ref 10.0–40.0)

## 2023-07-03 LAB — LAMOTRIGINE LEVEL: Lamotrigine Lvl: 13.1 ug/mL (ref 2.0–20.0)

## 2023-07-03 MED ORDER — VITAMIN D (ERGOCALCIFEROL) 1.25 MG (50000 UNIT) PO CAPS: 50000.0000 [IU] | ORAL_CAPSULE | ORAL | 0 refills | Status: AC

## 2023-07-03 NOTE — Telephone Encounter (Signed)
Called patient, she wants to leave dose the lamictal for now because headaches are better and she will pick up medication to take vitamin D. Pt had no questions at this time but was encouraged to call back if questions arise.

## 2023-07-03 NOTE — Telephone Encounter (Signed)
Please call labs show low vitamin D 11.1, mildly increased stable creatinine 1.44, Zonegran levels are therapeutic 16.4, Lamictal therapeutic 13.1. Would recommend Vitamin D treatment Rx 50,000  units once weekly.  We can try to switch to Lamictal XR 250 mg, 2 tablets at bedtime if she feels causing headache, but would be another medication change.   Meds ordered this encounter  Medications   Vitamin D, Ergocalciferol, (DRISDOL) 1.25 MG (50000 UNIT) CAPS capsule    Sig: Take 1 capsule (50,000 Units total) by mouth every 7 (seven) days.    Dispense:  12 capsule    Refill:  0

## 2023-07-07 NOTE — Telephone Encounter (Signed)
Pt reports that her insurance will not cover the vitamin D Rx,pt states its no longer covered. Pt has been told that another needs to be called in.  Pt is asking for a call to discuss this further.

## 2023-07-07 NOTE — Telephone Encounter (Signed)
Called pt and she stated that she was calling regarding Iron pills that were supposed to be called in for her and she would like to discuss with RN. Please advise.

## 2023-07-09 ENCOUNTER — Ambulatory Visit (HOSPITAL_COMMUNITY): Payer: Medicare HMO | Admitting: Psychiatry

## 2023-07-09 ENCOUNTER — Other Ambulatory Visit (HOSPITAL_COMMUNITY): Payer: Self-pay | Admitting: Psychiatry

## 2023-07-15 ENCOUNTER — Telehealth: Payer: Self-pay | Admitting: Neurology

## 2023-07-15 NOTE — Telephone Encounter (Signed)
Hallucinations are less likely from her Zonisamide and Lamotrigine. The anxiety/hallucications can be due to her increase stress due to things going on with he life. Ask her to follow up with PCP

## 2023-07-15 NOTE — Telephone Encounter (Signed)
Patient called back wanted to ask taking all medicine with seizure medicine cause symptoms she is having?

## 2023-07-15 NOTE — Telephone Encounter (Signed)
Call to patient, she states that she has been taking all of her medications and not missed any doses. She complains of hallucinations and anxiety due to things going on with her family and friends. She also states she got upset and call her PCP and told them she wasn't happy with them and isn't sure she has a PCP any longer. She denies SI/HI. She reports sleeping well at night/ Advised I would reach out to Dr. Teresa Coombs.

## 2023-07-15 NOTE — Telephone Encounter (Signed)
Patient having hallucinations and this morning she is having anxiety she wanted to speak to a nurse to see if this was caused by her medication. Has been happening last 2 weeks.

## 2023-07-15 NOTE — Telephone Encounter (Signed)
Call to patient, explained Dr. Teresa Coombs advice and recommendations. Patient verbalized understanding

## 2023-07-16 ENCOUNTER — Telehealth: Payer: Self-pay | Admitting: Neurology

## 2023-07-16 NOTE — Telephone Encounter (Signed)
Called and spoke to patient about medications and she was agreeable to continue taking both medicaiton twice a day. Pt verbalized understanding. Pt had no questions at this time but was encouraged to call back if questions arise.

## 2023-07-16 NOTE — Telephone Encounter (Signed)
Pt is asking if these medications:lamoTRIgine &  Zonisamide can be taken together, please call.

## 2023-07-30 ENCOUNTER — Ambulatory Visit (HOSPITAL_COMMUNITY): Payer: Medicare HMO | Admitting: Psychiatry

## 2023-07-30 ENCOUNTER — Other Ambulatory Visit: Payer: Self-pay

## 2023-07-30 ENCOUNTER — Encounter (HOSPITAL_COMMUNITY): Payer: Self-pay | Admitting: Psychiatry

## 2023-07-30 VITALS — BP 140/94 | HR 94 | Ht 65.0 in | Wt 268.0 lb

## 2023-07-30 DIAGNOSIS — F22 Delusional disorders: Secondary | ICD-10-CM

## 2023-07-30 MED ORDER — LAMOTRIGINE 100 MG PO TABS: ORAL_TABLET | ORAL | 3 refills | Status: DC

## 2023-07-30 MED ORDER — ARIPIPRAZOLE 5 MG PO TABS: 5.0000 mg | ORAL_TABLET | Freq: Every day | ORAL | 3 refills | Status: DC

## 2023-07-30 NOTE — Progress Notes (Signed)
Psychiatric Initial Adult Assessment   Patient Identification: Brandi Gomez MRN:  [id number] Date of Evaluation:  07/30/2023 Referral Source: Dr. Hayden Pedro Chief Complaint:  Suspiciousness/paranoia Visit Diagnosis: Mood disorder secondary to CVA   Today the patient is seen with her boyfriend [name].  The patient seems to be different.  She seems to be suspicious and nearly paranoid.  She thinks that people are getting into her phone.  They are not just listening to her conversations but they are getting in somehow.  She is frightened that they are stealing from her and taking money from her bank account.  Today she called the police she was so frightened.  [name] says this is the way that she starts to decline.  She also has been posturing in such a way where she seems to be depressed.  The patient says she is sleeping okay.  Her appetite is unchanged.  She denies voices or visions.  But she clearly appears sad and distressed.  She appears to be frightened. (Hypo) Manic Symptoms:   Anxiety Symptoms:   Psychotic Symptoms:   PTSD Symptoms:   Past Psychiatric History: Cymbalta 30 mg Previous Psychotropic Medications: Cymbalta 30 mg  Substance Abuse History in the last 12 months:  No.  Consequences of Substance Abuse:   Past Medical History:  Past Medical History:  Diagnosis Date   Anemia    Anxiety    Bipolar 1 disorder (HCC)    Common migraine 05/19/2015   Depression    Diabetes mellitus, type II (HCC)    Hypertension    Irritable bowel syndrome (IBS)    Mild mental retardation    Obesity    Partial complex seizure disorder with intractable epilepsy (HCC) 05/12/2014   Seizures (HCC)    intractable, sz 08/23/17   Sleep apnea    Stroke (HCC)    Type II or unspecified type diabetes mellitus without mention of complication, not stated as uncontrolled     Past Surgical History:  Procedure Laterality Date   BUBBLE STUDY  11/15/2019   Procedure: BUBBLE STUDY;  Surgeon: Lars Masson, MD;  Location: [facility] ENDOSCOPY;  Service: Cardiovascular;;   COLONOSCOPY     2012-normal , Dr Jarold Motto   ESOPHAGOGASTRODUODENOSCOPY     normal-Dr Jarold Motto 2012   LOOP RECORDER INSERTION N/A 05/30/2017   Procedure: Loop Recorder Insertion;  Surgeon: Regan Lemming, MD;  Location: [facility] INVASIVE CV LAB;  Service: Cardiovascular;  Laterality: N/A;   LOOP RECORDER REMOVAL N/A 03/04/2018   Procedure: LOOP RECORDER REMOVAL;  Surgeon: Regan Lemming, MD;  Location: [facility] INVASIVE CV LAB;  Service: Cardiovascular;  Laterality: N/A;   MYRINGOTOMY WITH TUBE PLACEMENT     MYRINGOTOMY WITH TUBE PLACEMENT Right 11/05/2017   Procedure: MYRINGOTOMY WITH TUBE PLACEMENT;  Surgeon: Suzanna Obey, MD;  Location: [facility] OR;  Service: ENT;  Laterality: Right;  right T Tube placement   NASAL SINUS SURGERY     TEE WITHOUT CARDIOVERSION N/A 05/30/2017   Procedure: TRANSESOPHAGEAL ECHOCARDIOGRAM (TEE);  Surgeon: Elease Hashimoto Deloris Ping, MD;  Location: [facility] ENDOSCOPY;  Service: Cardiovascular;  Laterality: N/A;   TEE WITHOUT CARDIOVERSION N/A 11/15/2019   Procedure: TRANSESOPHAGEAL ECHOCARDIOGRAM (TEE);  Surgeon: Lars Masson, MD;  Location: [facility] ENDOSCOPY;  Service: Cardiovascular;  Laterality: N/A;    Family Psychiatric History:   Family History:  Family History  Problem Relation Age of Onset   Diabetes Mother        passed away from accidental death   Hypertension Mother  Bipolar disorder Father    Diabetes Daughter    Leukemia Daughter    Ovarian cancer Maternal Aunt     Social History:   Social History   Socioeconomic History   Marital status: Single    Spouse name: [name]   Number of children: 3   Years of education: 12   Highest education level: Not on file  Occupational History   Occupation: disabled    Employer: DISABLED  Tobacco Use   Smoking status: Never   Smokeless tobacco: Never  Vaping Use   Vaping status: Never Used  Substance and Sexual Activity   Alcohol use: No    Drug use: No   Sexual activity: Yes  Other Topics Concern   Not on file  Social History Narrative   Patient lives at home with daughter.    Patient has 3 children.    Patient is right handed.    Patient has a high school education.    Patient is on disability   Patient drinks 2 cups of caffeine daily.   Social Determinants of Health   Financial Resource Strain: Not on file  Food Insecurity: No Food Insecurity (12/26/2020)   Hunger Vital Sign    Worried About Running Out of Food in the Last Year: Never true    Ran Out of Food in the Last Year: Never true  Transportation Needs: No Transportation Needs (11/06/2020)   PRAPARE - Administrator, Civil Service (Medical): No    Lack of Transportation (Non-Medical): No  Physical Activity: Not on file  Stress: Not on file  Social Connections: Not on file    Additional Social History:   Allergies:   Allergies  Allergen Reactions   Amoxicillin Itching    Did it involve swelling of the face/tongue/throat, SOB, or low BP? No Did it involve sudden or severe rash/hives, skin peeling, or any reaction on the inside of your mouth or nose? No Did you need to seek medical attention at a hospital or doctor's office? No When did it last happen?      within the past 10 years If all above answers are "NO", may proceed with cephalosporin use.    Lisinopril Swelling    Angioedema    Hydrocodone Other (See Comments)    Depressed    Tegretol [Carbamazepine] Swelling    Throat swells    Metabolic Disorder Labs: Lab Results  Component Value Date   HGBA1C 7.9 (A) 01/18/2021   MPG 180.03 10/31/2020   MPG 139.85 10/12/2019   No results found for: "PROLACTIN" Lab Results  Component Value Date   CHOL 108 04/18/2020   TRIG 100 10/31/2020   HDL 52 04/18/2020   CHOLHDL 2.1 04/18/2020   VLDL 10 10/12/2019   LDLCALC 44 04/18/2020   LDLCALC 64 10/12/2019     Current Medications: Current Outpatient Medications  Medication Sig  Dispense Refill   ACCU-CHEK GUIDE test strip      acetaminophen (TYLENOL) 325 MG tablet Take 2 tablets (650 mg total) by mouth every 4 (four) hours as needed for mild pain (or temp > 37.5 C (99.5 F)).     ARIPiprazole (ABILIFY) 5 MG tablet Take 5 mg by mouth daily.     atorvastatin (LIPITOR) 40 MG tablet TAKE 1 TABLET (40 MG TOTAL) BY MOUTH DAILY AT 6 PM FOR CHOLESTEROL 90 tablet 0   carvedilol (COREG) 6.25 MG tablet Take 6.25 mg by mouth 2 (two) times daily.     cetirizine (ZYRTEC ALLERGY)  10 MG tablet Take 1 tablet (10 mg total) by mouth daily. 30 tablet 0   ferrous sulfate 325 (65 FE) MG tablet Take 325 mg by mouth 2 (two) times daily.     folic acid (FOLVITE) 1 MG tablet Take 1 mg by mouth daily.     glipiZIDE (GLUCOTROL) 10 MG tablet Take 10 mg by mouth 2 (two) times daily.     hydrOXYzine (VISTARIL) 25 MG capsule 1 QHS  1 qday PRN 60 capsule 5   metroNIDAZOLE (FLAGYL) 500 MG tablet Take 1 tablet (500 mg total) by mouth 2 (two) times daily. 14 tablet 0   Vitamin D, Ergocalciferol, (DRISDOL) 1.25 MG (50000 UNIT) CAPS capsule Take 1 capsule (50,000 Units total) by mouth every 7 (seven) days. 12 capsule 0   zonisamide (ZONEGRAN) 100 MG capsule Take 1 capsule (100 mg total) by mouth 2 (two) times daily. 180 capsule 3   hydrALAZINE (APRESOLINE) 10 MG tablet Take 1 tablet (10 mg total) by mouth 2 (two) times daily. 180 tablet 3   isosorbide dinitrate (ISORDIL) 10 MG tablet Take 1 tablet (10 mg total) by mouth 2 (two) times daily. 180 tablet 3   lamoTRIgine (LAMICTAL) 100 MG tablet Take two and a half tablets twice a day 450 tablet 3   No current facility-administered medications for this visit.    Neurologic: Headache: No Seizure: Yes Paresthesias:No  Musculoskeletal: Strength & Muscle Tone: within normal limits Gait & Station: normal Patient leans: Backward and N/A  Psychiatric Specialty Exam: ROS  Blood pressure (!) 140/94, pulse 94, height 5\' 5"  (1.651 m), weight 268 lb (121.6  kg).Body mass index is 44.6 kg/m.  General Appearance: Casual  Eye Contact:  Fair  Speech:  Slurred  Volume:  Normal  Mood:  Dysphoric  Affect:  Appropriate  Thought Process:  Goal Directed  Orientation:  Full (Time, Place, and Person)  Thought Content:  Logical  Suicidal Thoughts:  No  Homicidal Thoughts:  No  Memory:  Negative  Judgement:  Fair  Insight:  Lacking  Psychomotor Activity:  Decreased  Concentration:    Recall:  Fair  Fund of Knowledge:Fair  Language: Fair  Akathisia:  No  Handed:    AIMS (if indicated):    Assets:  Desire for Improvement  ADL's:  Intact  Cognition: WNL  Sleep:      Treatment Plan Summary:   This patient's first problem in this clinic is delusional disorder.  She does not really meet criteria for schizophrenia and I really do not think she has an affective disorder.  For years she was on an antipsychotic medicine and did fairly well.  We slowly took her off the agent and she did well for about a year or so but now she is changed.  She is more tense she is more scared and somewhat irrational.  Today we will start her back on 5 mg of Abilify.  Her neurologist also has her on Lamictal.  We will clarify that she is on it and continue this as well.  The possibility that she has a bipolar disorder is consistent with using Lamictal.  Nonetheless her interventions will be Lamictal and Abilify.  She will take 5 mg of Abilify at night and she will return to see me in 6 weeks.  She is not suicidal.  She had a little bit of an emotional outburst and then apologized for it.  She is very sensitive about the diagnosis of schizophrenia.  I shared with her that  I do not think she has schizophrenia.

## 2023-08-04 ENCOUNTER — Other Ambulatory Visit: Payer: Self-pay

## 2023-08-04 MED ORDER — LAMOTRIGINE 100 MG PO TABS: 300.0000 mg | ORAL_TABLET | Freq: Two times a day (BID) | ORAL | 3 refills | Status: DC

## 2023-08-04 NOTE — Telephone Encounter (Signed)
Due to system wide internet trouble, computers shut down trying to send in lamictal and trazodone appeared to have sent to pharmacy. Call to pharmacy, [facility], spoke to [place], she confirmed that no order for trazodone was received. She did state that lamictal was sent 100 mg 2.5 tablets 2x  day was sent. Advised that was supposed to cancelled and was sent on error. She agreed to cancel that order and an additional one that was on file. I advised that new one would be sent for lamictal 100 mg 3 tablets BID and requested that due to patient cognitive concerns to dispense the 100 mg tablets as patient had verbalized understanding taking (3) 100 mg tablets in the morning and (3) 100 mg tablets at night. [place]  verbalized understanding and stated she would make documentation of request.

## 2023-08-04 NOTE — Telephone Encounter (Addendum)
Pt and family requesting to be taken off zonisamide (ZONEGRAN) 100 MG capsule due causing depression, hallucination. Pt said been on a long time, but takes along time to show side effects. Would like a call from the nurse to discuss alterative medication.

## 2023-08-04 NOTE — Telephone Encounter (Signed)
Please advise patient to stop Zonisamide but to increase the Lamotrigine to 300 mg twice daily. We can make additional changes on our upcoming visit in October.

## 2023-08-04 NOTE — Telephone Encounter (Signed)
Call to patient, she states she wants to come off of the zonegran due to depressions and hallucinations. Asked patient is I could call daughter tierney to discuss and patient said I didn't need to because she was the patient. She denied SI/HI. Advised I would send to Margie Ege, NP and Dr. Teresa Coombs for recommendations. Advised patient there may be a weaning process so to continue as prescribed. Patient appreciative of call.

## 2023-08-04 NOTE — Addendum Note (Signed)
Addended by: Danne Harbor on: 08/04/2023 03:05 PM   Modules accepted: Orders

## 2023-08-04 NOTE — Addendum Note (Signed)
Addended by: Danne Harbor on: 08/04/2023 11:54 AM   Modules accepted: Orders

## 2023-08-04 NOTE — Telephone Encounter (Signed)
Call to patient, advised of Dr. Teresa Coombs agreement to stop Zonegran but to increase Lamictal to 300mg  2x daily. Advised to take (3) 100 mg tablet in the morning and (3) 100 mg tablets in the evening.  I spelled out medication and gave generic name as well and patient verbalized understanding.

## 2023-08-04 NOTE — Addendum Note (Signed)
Addended by: Danne Harbor on: 08/04/2023 03:17 PM   Modules accepted: Orders

## 2023-08-04 NOTE — Addendum Note (Signed)
Addended by: Lenn Cal on: 08/04/2023 11:35 AM   Modules accepted: Orders

## 2023-08-04 NOTE — Addendum Note (Signed)
Addended by: Lenn Cal on: 08/04/2023 04:58 PM   Modules accepted: Orders

## 2023-08-10 ENCOUNTER — Emergency Department (HOSPITAL_COMMUNITY)
Admission: EM | Admit: 2023-08-10 | Discharge: 2023-08-10 | Disposition: A | Discharge status: Home / Self Care | Payer: Medicare HMO

## 2023-08-10 ENCOUNTER — Encounter (HOSPITAL_COMMUNITY): Payer: Self-pay

## 2023-08-10 DIAGNOSIS — G40909 Epilepsy, unspecified, not intractable, without status epilepticus: Secondary | ICD-10-CM | POA: Diagnosis not present

## 2023-08-10 DIAGNOSIS — R569 Unspecified convulsions: Secondary | ICD-10-CM | POA: Diagnosis present

## 2023-08-10 DIAGNOSIS — G40919 Epilepsy, unspecified, intractable, without status epilepticus: Secondary | ICD-10-CM

## 2023-08-10 LAB — CBC WITH DIFFERENTIAL/PLATELET
Abs Immature Granulocytes: 0.03 10*3/uL (ref 0.00–0.07)
Basophils Absolute: 0 10*3/uL (ref 0.0–0.1)
Basophils Relative: 1 %
Eosinophils Absolute: 0.1 10*3/uL (ref 0.0–0.5)
Eosinophils Relative: 2 %
HCT: 36.4 % (ref 36.0–46.0)
Hemoglobin: 11 g/dL — ABNORMAL LOW (ref 12.0–15.0)
Immature Granulocytes: 0 %
Lymphocytes Relative: 19 %
Lymphs Abs: 1.3 10*3/uL (ref 0.7–4.0)
MCH: 30.4 pg (ref 26.0–34.0)
MCHC: 30.2 g/dL (ref 30.0–36.0)
MCV: 100.6 fL — ABNORMAL HIGH (ref 80.0–100.0)
Monocytes Absolute: 0.4 10*3/uL (ref 0.1–1.0)
Monocytes Relative: 5 %
Neutro Abs: 5.3 10*3/uL (ref 1.7–7.7)
Neutrophils Relative %: 73 %
Platelets: 292 10*3/uL (ref 150–400)
RBC: 3.62 MIL/uL — ABNORMAL LOW (ref 3.87–5.11)
RDW: 14.3 % (ref 11.5–15.5)
WBC: 7.1 10*3/uL (ref 4.0–10.5)
nRBC: 0 % (ref 0.0–0.2)

## 2023-08-10 LAB — BASIC METABOLIC PANEL WITH GFR
Anion gap: 8 (ref 5–15)
BUN: 11 mg/dL (ref 6–20)
CO2: 23 mmol/L (ref 22–32)
Calcium: 8.3 mg/dL — ABNORMAL LOW (ref 8.9–10.3)
Chloride: 103 mmol/L (ref 98–111)
Creatinine, Ser: 1.37 mg/dL — ABNORMAL HIGH (ref 0.44–1.00)
GFR, Estimated: 46 mL/min — ABNORMAL LOW
Glucose, Bld: 176 mg/dL — ABNORMAL HIGH (ref 70–99)
Potassium: 3.9 mmol/L (ref 3.5–5.1)
Sodium: 134 mmol/L — ABNORMAL LOW (ref 135–145)

## 2023-08-10 LAB — MAGNESIUM: Magnesium: 2.2 mg/dL (ref 1.7–2.4)

## 2023-08-10 LAB — CBG MONITORING, ED: Glucose-Capillary: 158 mg/dL — ABNORMAL HIGH (ref 70–99)

## 2023-08-10 MED ORDER — CLONAZEPAM 0.5 MG PO TABS
0.5000 mg | ORAL_TABLET | Freq: Two times a day (BID) | ORAL | 0 refills | Status: DC | PRN
Start: 2023-08-10 — End: 2024-02-02

## 2023-08-10 MED ORDER — LACTATED RINGERS IV BOLUS
1000.0000 mL | Freq: Once | INTRAVENOUS | Status: AC
  Administered 2023-08-10: 1000 mL via INTRAVENOUS

## 2023-08-10 MED ORDER — CLONAZEPAM 0.5 MG PO TABS
0.5000 mg | ORAL_TABLET | Freq: Once | ORAL | Status: AC
  Administered 2023-08-10: 0.5 mg via ORAL
  Filled 2023-08-10: qty 1

## 2023-08-10 MED ORDER — LAMOTRIGINE 100 MG PO TABS
300.0000 mg | ORAL_TABLET | Freq: Once | ORAL | Status: AC
  Administered 2023-08-10: 300 mg via ORAL
  Filled 2023-08-10: qty 3

## 2023-08-10 NOTE — Discharge Instructions (Signed)
Your workup today was reassuring.  I discussed your case with our on-call neurologist who recommends giving a dose of Klonopin in the emergency department as well as discharging you with 1 dose that you will need to take tomorrow morning.  Continue taking your  lamotrigine. Please follow-up with your neurologist within 1 week.  If any concerning symptoms return to the emergency room.

## 2023-08-10 NOTE — ED Triage Notes (Signed)
Pt arrived via EMS, from home. Witnessed seizure x2 at home today by family. Pt postictal on EMS arrival. Aox4. Able to answer questions in triage.

## 2023-08-10 NOTE — ED Notes (Signed)
Pt calling for ride home 

## 2023-08-10 NOTE — ED Provider Notes (Signed)
[place] EMERGENCY DEPARTMENT AT [facility] Provider Note   CSN: [id number] Arrival date & time: 08/10/23  1823     History  Chief Complaint  Patient presents with   Seizures    Brandi Gomez is a 52 y.o. female.  52 year old female with past medical history significant for seizures, depression presents today for concern of 2 back-to-back seizures while at home with her daughter.  Daughter witnessed the seizure-like activity.  She states she takes lamotrigine and is compliant with that without missing any recent doses.  Currently back to baseline.  Denies any complaints at this time.  She states she follows with [facility] neurology.  The history is provided by the patient. No language interpreter was used.       Home Medications Prior to Admission medications   Medication Sig Start Date End Date Taking? Authorizing Provider  ACCU-CHEK GUIDE test strip  04/27/22   [provider]  acetaminophen (TYLENOL) 325 MG tablet Take 2 tablets (650 mg total) by mouth every 4 (four) hours as needed for mild pain (or temp > 37.5 C (99.5 F)). 10/19/19   Angiulli, Mcarthur Rossetti, PA-C  ARIPiprazole (ABILIFY) 5 MG tablet Take 1 tablet (5 mg total) by mouth daily. 07/30/23   Plovsky, Earvin Hansen, MD  atorvastatin (LIPITOR) 40 MG tablet TAKE 1 TABLET (40 MG TOTAL) BY MOUTH DAILY AT 6 PM FOR CHOLESTEROL 09/24/21   Sagardia, Eilleen Kempf, MD  carvedilol (COREG) 6.25 MG tablet Take 6.25 mg by mouth 2 (two) times daily. 05/20/21   [provider]  cetirizine (ZYRTEC ALLERGY) 10 MG tablet Take 1 tablet (10 mg total) by mouth daily. 02/15/22   Piontek, Denny Peon, MD  ferrous sulfate 325 (65 FE) MG tablet Take 325 mg by mouth 2 (two) times daily. 07/10/22   [provider]  folic acid (FOLVITE) 1 MG tablet Take 1 mg by mouth daily. 03/04/22   [provider]  glipiZIDE (GLUCOTROL) 10 MG tablet Take 10 mg by mouth 2 (two) times daily. 04/27/22   [provider]   hydrALAZINE (APRESOLINE) 10 MG tablet Take 1 tablet (10 mg total) by mouth 2 (two) times daily. 01/04/20 07/31/21  Georgina Quint, MD  hydrOXYzine (VISTARIL) 25 MG capsule 1 QHS  1 qday PRN 02/12/23   Archer Asa, MD  isosorbide dinitrate (ISORDIL) 10 MG tablet Take 1 tablet (10 mg total) by mouth 2 (two) times daily. 01/04/20 07/31/21  Georgina Quint, MD  lamoTRIgine (LAMICTAL) 100 MG tablet Take 3 tablets (300 mg total) by mouth 2 (two) times daily. 08/04/23   Windell Norfolk, MD  metroNIDAZOLE (FLAGYL) 500 MG tablet Take 1 tablet (500 mg total) by mouth 2 (two) times daily. 09/13/21   Lamptey, Britta Mccreedy, MD  Vitamin D, Ergocalciferol, (DRISDOL) 1.25 MG (50000 UNIT) CAPS capsule Take 1 capsule (50,000 Units total) by mouth every 7 (seven) days. 07/03/23   Glean Salvo, NP  methylphenidate (RITALIN) 5 MG tablet Take 1 tablet (5 mg total) by mouth 2 (two) times daily. Patient not taking: Reported on 12/29/2019 10/29/19 01/01/20  Horton Chin, MD      Allergies    Amoxicillin, Lisinopril, Hydrocodone, and Tegretol [carbamazepine]    Review of Systems   Review of Systems  Constitutional:  Negative for chills and fever.  Respiratory:  Negative for shortness of breath.   Cardiovascular:  Negative for chest pain.  Neurological:  Positive for seizures. Negative for light-headedness.  All other systems reviewed and are negative.  Physical Exam Updated Vital Signs BP (!) 157/101   Pulse 83   Temp 98.4 F (36.9 C) (Oral)   Resp 16   LMP  (LMP Unknown)   SpO2 100%  Physical Exam Vitals and nursing note reviewed.  Constitutional:      General: She is not in acute distress.    Appearance: Normal appearance. She is not ill-appearing.  HENT:     Head: Normocephalic and atraumatic.     Nose: Nose normal.  Eyes:     Conjunctiva/sclera: Conjunctivae normal.  Cardiovascular:     Rate and Rhythm: Normal rate.  Pulmonary:     Effort: Pulmonary effort is normal. No respiratory  distress.  Musculoskeletal:        General: No deformity. Normal range of motion.  Skin:    Findings: No rash.  Neurological:     General: No focal deficit present.     Mental Status: She is alert and oriented to person, place, and time. Mental status is at baseline.     Cranial Nerves: No cranial nerve deficit.     Motor: No weakness.     ED Results / Procedures / Treatments   Labs (all labs ordered are listed, but only abnormal results are displayed) Labs Reviewed  CBC WITH DIFFERENTIAL/PLATELET - Abnormal; Notable for the following components:      Result Value   RBC 3.62 (*)    Hemoglobin 11.0 (*)    MCV 100.6 (*)    All other components within normal limits  BASIC METABOLIC PANEL - Abnormal; Notable for the following components:   Sodium 134 (*)    Glucose, Bld 176 (*)    Creatinine, Ser 1.37 (*)    Calcium 8.3 (*)    GFR, Estimated 46 (*)    All other components within normal limits  CBG MONITORING, ED - Abnormal; Notable for the following components:   Glucose-Capillary 158 (*)    All other components within normal limits  MAGNESIUM  LAMOTRIGINE LEVEL    EKG None  Radiology No results found.  Procedures Procedures    Medications Ordered in ED Medications  lamoTRIgine (LAMICTAL) tablet 300 mg (has no administration in time range)  lactated ringers bolus 1,000 mL (0 mLs Intravenous Stopped 08/10/23 2118)  clonazePAM (KLONOPIN) tablet 0.5 mg (0.5 mg Oral Given 08/10/23 2124)    ED Course/ Medical Decision Making/ A&P                                 Medical Decision Making Amount and/or Complexity of Data Reviewed Labs: ordered.  Risk Prescription drug management.   52 year old female presents today for concern of multiple seizures.  She has history of seizures and takes Lamictal.  No recent missed doses.  Currently back to baseline.  She did have a postictal period with the AMS but now resolved.  Will give fluid bolus.  CBC without leukocytosis.   Hemoglobin 11.0 which is around her baseline.  BMP shows creatinine at 1.37 which is around her baseline.  Glucose 176 otherwise no acute concerns.  Magnesium of 2.2.  Lamictal level collected.  Will take about a week to result.  She remained without seizure-like activity in the emergency department.  She was observed for 3 hours.  Discussed with Dr. Amada Jupiter of neurology who recommends giving patient a dose of Klonopin 0.5 mg tonight and another dose of Klonopin to take in the morning.  No other  medication adjustment.  She will need to follow-up with neurology within 1 week.  Patient is agreeable with plan.  She is discharged in stable condition.  Return precautions discussed.   Final Clinical Impression(s) / ED Diagnoses Final diagnoses:  Breakthrough seizure (HCC)  Seizure disorder (HCC)    Rx / DC Orders ED Discharge Orders          Ordered    clonazePAM (KLONOPIN) 0.5 MG tablet  2 times daily PRN        08/10/23 2136              Marita Kansas, PA-C 08/10/23 2140    Coral Spikes, DO 08/10/23 2338

## 2023-08-12 ENCOUNTER — Telehealth: Payer: Self-pay | Admitting: Neurology

## 2023-08-12 ENCOUNTER — Telehealth (HOSPITAL_COMMUNITY): Payer: Self-pay

## 2023-08-12 ENCOUNTER — Other Ambulatory Visit (HOSPITAL_COMMUNITY): Payer: Self-pay | Admitting: Psychiatry

## 2023-08-12 MED ORDER — CLOBAZAM 10 MG PO TABS: 10.0000 mg | ORAL_TABLET | Freq: Every day | ORAL | 3 refills | Status: DC

## 2023-08-12 NOTE — Telephone Encounter (Signed)
Call to patient, she verbalized understanding of taking new medication clobazam  and continuing lamotrigine.

## 2023-08-12 NOTE — Telephone Encounter (Signed)
This patient called in with a question about her Abilify and Lamictal. Patient has a seizure over the weekend and wanted to know if the medications could do that. I attempted to call patient back and the number is not in service. I called the other number in her chart for [name], he comes to appointments with her. I left a voicemail with him to have the patient call me back

## 2023-08-12 NOTE — Telephone Encounter (Signed)
Called Pt Pharmacy and they stated a PA was needed for pt medication.

## 2023-08-12 NOTE — Telephone Encounter (Signed)
Have her add Onfi 10 mg at bedtime for seizure prevention. Continue Lamictal 300 mg twice daily. Thanks  Meds ordered this encounter  Medications   cloBAZam (ONFI) 10 MG tablet    Sig: Take 1 tablet (10 mg total) by mouth at bedtime.    Dispense:  30 tablet    Refill:  3

## 2023-08-12 NOTE — Addendum Note (Signed)
Addended by: Glean Salvo on: 08/12/2023 02:01 PM   Modules accepted: Orders

## 2023-08-12 NOTE — Telephone Encounter (Signed)
Pt is asking to be called to discuss the pharmacy telling her to call and see if RN is willing to call in something in place of her  cloBAZam 10 mg Oral Daily

## 2023-08-12 NOTE — Telephone Encounter (Signed)
Yes, onfi 10 mg at bedtime is a great option.

## 2023-08-12 NOTE — Telephone Encounter (Signed)
Pt had a seizure over the weekend, she was in the hospital.  Pt would like a call to discuss , she can be reached at [phone]

## 2023-08-13 ENCOUNTER — Other Ambulatory Visit (HOSPITAL_COMMUNITY): Payer: Self-pay

## 2023-08-13 ENCOUNTER — Telehealth: Payer: Self-pay

## 2023-08-13 NOTE — Telephone Encounter (Signed)
Pharmacy Patient Advocate Encounter   Received notification from Physician's Office that prior authorization for cloBAZam 10MG  tablets is required/requested.   Insurance verification completed.   The patient is insured through [employer] .   Per test claim: PA required; PA submitted to [facility] via CoverMyMeds Key/confirmation #/EOC [id number] Status is pending

## 2023-08-13 NOTE — Telephone Encounter (Signed)
PA request has been Submitted. New Encounter created for follow up. For additional info see Pharmacy Prior Auth telephone encounter from 08/13/2023.

## 2023-08-15 ENCOUNTER — Other Ambulatory Visit (HOSPITAL_COMMUNITY): Payer: Self-pay

## 2023-08-15 LAB — LAMOTRIGINE LEVEL: Lamotrigine Lvl: 13.5 ug/mL (ref 2.0–20.0)

## 2023-08-15 NOTE — Telephone Encounter (Signed)
Pharmacy Patient Advocate Encounter  Received notification from [facility] that Prior Authorization for cloBAZam 10MG  tablets has been APPROVED from 08/13/2023 to 12/08/2023. Ran test claim, Copay is $Unable to obtain due to Refill Too Soon rejection-Last filled on 08/12/2023 and the next available fill date is 09/04/2023 per test claim. This test claim was processed through [facility]- copay amounts may vary at other pharmacies due to pharmacy/plan contracts, or as the patient moves through the different stages of their insurance plan.   PA #/Case ID/Reference #: PA Case ID #: [id number]

## 2023-08-18 ENCOUNTER — Telehealth: Payer: Self-pay | Admitting: Neurology

## 2023-08-18 DIAGNOSIS — G4733 Obstructive sleep apnea (adult) (pediatric): Secondary | ICD-10-CM

## 2023-08-18 DIAGNOSIS — G40909 Epilepsy, unspecified, not intractable, without status epilepticus: Secondary | ICD-10-CM

## 2023-08-18 NOTE — Addendum Note (Signed)
Addended by: Glean Salvo on: 08/18/2023 11:03 AM   Modules accepted: Orders

## 2023-08-18 NOTE — Telephone Encounter (Signed)
Pt is asking that her CPAP pressure be increased from 5.  Pt also states her new medication is working but pt stated " I am thinking while I sleep and fear I will have a seizure", please call pt to discuss this concern.

## 2023-08-18 NOTE — Telephone Encounter (Signed)
Community message sent

## 2023-08-18 NOTE — Telephone Encounter (Signed)
Called and spoke to patient about call notes and she states everything fits well with the mask and machine but doesn't think it "pushing hard enough", wants to know if it is possible for pressure to be increased. She also states the night before she started her medications she was "thinking while she was asleep" and woke herself up so she didn't have a seizure. I asked to explain a little more but she could only tell me she was thinking while sleeping, the next day she started the new medication and states they are doing really well and she has not had any more episodes at night since then. Would like provider to review CPAP, but other is an [place]

## 2023-08-18 NOTE — Telephone Encounter (Signed)
I pulled her CPAP download below, pressure currently 7-14. Staying around 12-13. Her AHI is well treated at 1.2. Her mask is leaking some. I will bump it up to 7-15 cm water for her comfort. If she doesn't note improvement have her let me know.

## 2023-09-17 ENCOUNTER — Encounter: Payer: Self-pay | Admitting: Nurse Practitioner

## 2023-09-23 ENCOUNTER — Telehealth: Payer: Self-pay | Admitting: Neurology

## 2023-09-23 NOTE — Telephone Encounter (Signed)
Call back to patient, she reports she had 1 bad dream and 2 good dreams in the last five days. She does wake up with a headache sometimes and needing to go to the bathroom but denies any incontinent episodes and denies SI/HI. She reports medication compliance. She reports intake of adequate fluids. Advised I would send to provider for review. Patient appreciative of call

## 2023-09-23 NOTE — Telephone Encounter (Signed)
Pt said started Friday, 10/11 and Monday, 10/14 night having vivid dreams. One was a bad dream and two of the dreams were good. Would like a call back.

## 2023-09-23 NOTE — Telephone Encounter (Signed)
Called patient back and she states she has not missed any medications, but would keep track of the dreams to see if they worsen in numbers, but she was appreciative of call and had no further questions at this time

## 2023-09-23 NOTE — Telephone Encounter (Signed)
Her CPAP use looks really good. She has been on Onfi since early September, along with chronic Lamictal.

## 2023-09-24 ENCOUNTER — Ambulatory Visit: Payer: Medicare HMO | Admitting: Neurology

## 2023-09-30 ENCOUNTER — Ambulatory Visit (HOSPITAL_BASED_OUTPATIENT_CLINIC_OR_DEPARTMENT_OTHER): Payer: Medicare HMO | Admitting: Psychiatry

## 2023-09-30 VITALS — BP 123/77 | HR 83 | Resp 20 | Ht 63.0 in | Wt 285.6 lb

## 2023-09-30 DIAGNOSIS — F22 Delusional disorders: Secondary | ICD-10-CM

## 2023-09-30 MED ORDER — ARIPIPRAZOLE 10 MG PO TABS: 5.0000 mg | ORAL_TABLET | Freq: Every day | ORAL | 2 refills | Status: DC

## 2023-09-30 NOTE — Progress Notes (Signed)
Psychiatric Initial Adult Assessment   Patient Identification: Brandi Gomez MRN:  [id number] Date of Evaluation:  09/30/2023 Referral Source: Dr. Hayden Pedro Chief Complaint:  Suspiciousness/paranoia Visit Diagnosis: Mood disorder secondary to CVA   . Today the patient is seen with her boyfriend Brandi Gomez.  It is hard to interpret if the patient is any better.  She says she feels no different on the 5 mg of Abilify.  On the other hand when she came into the room she started to cry.  She says that she believes that she has made an arrangement with the devil and that she was so upset about it she called her pastor to talk to him about.  The patient said that there are lots of people believing in the devil on the Instagram.  It is evident that she is getting overwhelmed with the Instagram's system and that she herself is raised all pictures.  She says she did what quantity verbality pictures to be available for the devil.  She clearly seems to still be psychotic.  Today we will increase her Abilify to 10 mg.  The patient is sleeping and eating fairly well.  She denies auditory hallucinations. (Hypo) Manic Symptoms:   Anxiety Symptoms:   Psychotic Symptoms:   PTSD Symptoms:   Past Psychiatric History: Cymbalta 30 mg Previous Psychotropic Medications: Cymbalta 30 mg  Substance Abuse History in the last 12 months:  No.  Consequences of Substance Abuse:   Past Medical History:  Past Medical History:  Diagnosis Date   Anemia    Anxiety    Bipolar 1 disorder (HCC)    Common migraine 05/19/2015   Depression    Diabetes mellitus, type II (HCC)    Hypertension    Irritable bowel syndrome (IBS)    Mild mental retardation    Obesity    Partial complex seizure disorder with intractable epilepsy (HCC) 05/12/2014   Seizures (HCC)    intractable, sz 08/23/17   Sleep apnea    Stroke (HCC)    Type II or unspecified type diabetes mellitus without mention of complication, not stated as uncontrolled      Past Surgical History:  Procedure Laterality Date   BUBBLE STUDY  11/15/2019   Procedure: BUBBLE STUDY;  Surgeon: Lars Masson, MD;  Location: [facility] ENDOSCOPY;  Service: Cardiovascular;;   COLONOSCOPY     2012-normal , Dr Jarold Motto   ESOPHAGOGASTRODUODENOSCOPY     normal-Dr Jarold Motto 2012   LOOP RECORDER INSERTION N/A 05/30/2017   Procedure: Loop Recorder Insertion;  Surgeon: Regan Lemming, MD;  Location: [facility] INVASIVE CV LAB;  Service: Cardiovascular;  Laterality: N/A;   LOOP RECORDER REMOVAL N/A 03/04/2018   Procedure: LOOP RECORDER REMOVAL;  Surgeon: Regan Lemming, MD;  Location: [facility] INVASIVE CV LAB;  Service: Cardiovascular;  Laterality: N/A;   MYRINGOTOMY WITH TUBE PLACEMENT     MYRINGOTOMY WITH TUBE PLACEMENT Right 11/05/2017   Procedure: MYRINGOTOMY WITH TUBE PLACEMENT;  Surgeon: Suzanna Obey, MD;  Location: [facility] OR;  Service: ENT;  Laterality: Right;  right T Tube placement   NASAL SINUS SURGERY     TEE WITHOUT CARDIOVERSION N/A 05/30/2017   Procedure: TRANSESOPHAGEAL ECHOCARDIOGRAM (TEE);  Surgeon: Elease Hashimoto Deloris Ping, MD;  Location: [facility] ENDOSCOPY;  Service: Cardiovascular;  Laterality: N/A;   TEE WITHOUT CARDIOVERSION N/A 11/15/2019   Procedure: TRANSESOPHAGEAL ECHOCARDIOGRAM (TEE);  Surgeon: Lars Masson, MD;  Location: [facility] ENDOSCOPY;  Service: Cardiovascular;  Laterality: N/A;    Family Psychiatric History:   Family History:  Family History  Problem Relation Age of Onset   Diabetes Mother        passed away from accidental death   Hypertension Mother    Bipolar disorder Father    Diabetes Daughter    Leukemia Daughter    Ovarian cancer Maternal Aunt     Social History:   Social History   Socioeconomic History   Marital status: Single    Spouse name: Brandi Gomez   Number of children: 3   Years of education: 12   Highest education level: Not on file  Occupational History   Occupation: disabled    Employer: DISABLED  Tobacco Use   Smoking status:  Never   Smokeless tobacco: Never  Vaping Use   Vaping status: Never Used  Substance and Sexual Activity   Alcohol use: No   Drug use: No   Sexual activity: Yes  Other Topics Concern   Not on file  Social History Narrative   Patient lives at home with daughter.    Patient has 3 children.    Patient is right handed.    Patient has a high school education.    Patient is on disability   Patient drinks 2 cups of caffeine daily.   Social Determinants of Health   Financial Resource Strain: Not on file  Food Insecurity: No Food Insecurity (12/26/2020)   Hunger Vital Sign    Worried About Running Out of Food in the Last Year: Never true    Ran Out of Food in the Last Year: Never true  Transportation Needs: No Transportation Needs (11/06/2020)   PRAPARE - Administrator, Civil Service (Medical): No    Lack of Transportation (Non-Medical): No  Physical Activity: Not on file  Stress: Not on file  Social Connections: Not on file    Additional Social History:   Allergies:   Allergies  Allergen Reactions   Amoxicillin Itching    Did it involve swelling of the face/tongue/throat, SOB, or low BP? No Did it involve sudden or severe rash/hives, skin peeling, or any reaction on the inside of your mouth or nose? No Did you need to seek medical attention at a hospital or doctor's office? No When did it last happen?      within the past 10 years If all above answers are "NO", may proceed with cephalosporin use.    Lisinopril Swelling    Angioedema    Hydrocodone Other (See Comments)    Depressed    Tegretol [Carbamazepine] Swelling    Throat swells    Metabolic Disorder Labs: Lab Results  Component Value Date   HGBA1C 7.9 (A) 01/18/2021   MPG 180.03 10/31/2020   MPG 139.85 10/12/2019   No results found for: "PROLACTIN" Lab Results  Component Value Date   CHOL 108 04/18/2020   TRIG 100 10/31/2020   HDL 52 04/18/2020   CHOLHDL 2.1 04/18/2020   VLDL 10 10/12/2019    LDLCALC 44 04/18/2020   LDLCALC 64 10/12/2019     Current Medications: Current Outpatient Medications  Medication Sig Dispense Refill   ACCU-CHEK GUIDE test strip      acetaminophen (TYLENOL) 325 MG tablet Take 2 tablets (650 mg total) by mouth every 4 (four) hours as needed for mild pain (or temp > 37.5 C (99.5 F)).     ARIPiprazole (ABILIFY) 10 MG tablet Take 0.5 tablets (5 mg total) by mouth daily. 30 tablet 2   atorvastatin (LIPITOR) 40 MG tablet TAKE 1 TABLET (40 MG  TOTAL) BY MOUTH DAILY AT 6 PM FOR CHOLESTEROL 90 tablet 0   carvedilol (COREG) 6.25 MG tablet Take 6.25 mg by mouth 2 (two) times daily.     cetirizine (ZYRTEC ALLERGY) 10 MG tablet Take 1 tablet (10 mg total) by mouth daily. 30 tablet 0   cloBAZam (ONFI) 10 MG tablet Take 1 tablet (10 mg total) by mouth at bedtime. 30 tablet 3   clonazePAM (KLONOPIN) 0.5 MG tablet Take 1 tablet (0.5 mg total) by mouth 2 (two) times daily as needed for up to 1 dose for anxiety. 1 tablet 0   ferrous sulfate 325 (65 FE) MG tablet Take 325 mg by mouth 2 (two) times daily.     folic acid (FOLVITE) 1 MG tablet Take 1 mg by mouth daily.     glipiZIDE (GLUCOTROL) 10 MG tablet Take 10 mg by mouth 2 (two) times daily.     hydrALAZINE (APRESOLINE) 10 MG tablet Take 1 tablet (10 mg total) by mouth 2 (two) times daily. 180 tablet 3   hydrOXYzine (VISTARIL) 25 MG capsule 1 QHS  1 qday PRN 60 capsule 5   isosorbide dinitrate (ISORDIL) 10 MG tablet Take 1 tablet (10 mg total) by mouth 2 (two) times daily. 180 tablet 3   lamoTRIgine (LAMICTAL) 100 MG tablet Take 3 tablets (300 mg total) by mouth 2 (two) times daily. 180 tablet 3   metroNIDAZOLE (FLAGYL) 500 MG tablet Take 1 tablet (500 mg total) by mouth 2 (two) times daily. 14 tablet 0   Vitamin D, Ergocalciferol, (DRISDOL) 1.25 MG (50000 UNIT) CAPS capsule Take 1 capsule (50,000 Units total) by mouth every 7 (seven) days. 12 capsule 0   No current facility-administered medications for this visit.     Neurologic: Headache: No Seizure: Yes Paresthesias:No  Musculoskeletal: Strength & Muscle Tone: within normal limits Gait & Station: normal Patient leans: Backward and N/A  Psychiatric Specialty Exam: ROS  Blood pressure 123/77, pulse 83, resp. rate 20, height 5\' 3"  (1.6 m), weight 285 lb 9.6 oz (129.5 kg).Body mass index is 50.59 kg/m.  General Appearance: Casual  Eye Contact:  Fair  Speech:  Slurred  Volume:  Normal  Mood:  Dysphoric  Affect:  Appropriate  Thought Process:  Goal Directed  Orientation:  Full (Time, Place, and Person)  Thought Content:  Logical  Suicidal Thoughts:  No  Homicidal Thoughts:  No  Memory:  Negative  Judgement:  Fair  Insight:  Lacking  Psychomotor Activity:  Decreased  Concentration:    Recall:  Fair  Fund of Knowledge:Fair  Language: Fair  Akathisia:  No  Handed:    AIMS (if indicated):    Assets:  Desire for Improvement  ADL's:  Intact  Cognition: WNL  Sleep:      Treatment Plan Summary:   This patient's diagnosis is delusional disorder.  I believe that she is minimally improved on 5 mg of Abilify.  At this time we will increase it to 10 mg.  She will return to see me in 7 weeks.  She shows no evidence of tardive dyskinesia.  Her next visit we will thoroughly review issues about tardive dyskinesia.  The patient seems to be fragile to me.  Her boyfriend Brandi Gomez is extremely supportive.  We both encouraged her to discontinue the use of Instagram is not going to [employer] to search things.  They want to continue that fellow because she plays games and keeps herself engaged.  The patient will return to see me in 7  weeks.

## 2023-10-09 ENCOUNTER — Other Ambulatory Visit: Payer: Self-pay | Admitting: Registered Nurse

## 2023-10-09 DIAGNOSIS — N6320 Unspecified lump in the left breast, unspecified quadrant: Secondary | ICD-10-CM

## 2023-10-13 ENCOUNTER — Ambulatory Visit
Admission: RE | Admit: 2023-10-13 | Discharge: 2023-10-13 | Disposition: A | Discharge status: Home / Self Care | Payer: Medicare HMO | Source: Ambulatory Visit | Attending: Registered Nurse | Admitting: Registered Nurse

## 2023-10-13 ENCOUNTER — Other Ambulatory Visit: Payer: Self-pay | Admitting: Registered Nurse

## 2023-10-13 DIAGNOSIS — M25562 Pain in left knee: Secondary | ICD-10-CM

## 2023-10-15 ENCOUNTER — Telehealth: Payer: Self-pay | Admitting: Neurology

## 2023-10-15 NOTE — Telephone Encounter (Signed)
[facility] Kathrine Cords) calling to verify patient's prescriptions. Would like a call back and please ask for Danna.

## 2023-10-15 NOTE — Telephone Encounter (Signed)
Returned call to [place] they wanted to clarify rx

## 2023-10-20 ENCOUNTER — Telehealth: Payer: Self-pay | Admitting: Neurology

## 2023-10-20 DIAGNOSIS — G4733 Obstructive sleep apnea (adult) (pediatric): Secondary | ICD-10-CM

## 2023-10-20 NOTE — Telephone Encounter (Signed)
Will order new CPAP machine since overheating issue. I attached a download, her machine seems to be doing well for her. Setup in June 2021. Her last sleep study was in 2014. In 2018 Dr. Frances Furbish ordered another sleep study, but she didn't have it done.

## 2023-10-20 NOTE — Addendum Note (Signed)
Addended by: Glean Salvo on: 10/20/2023 03:12 PM   Modules accepted: Orders

## 2023-10-20 NOTE — Telephone Encounter (Signed)
Recommend new HST.

## 2023-10-20 NOTE — Telephone Encounter (Signed)
Pt has called to report that her CPAP is overheating, she would like a call to discuss a replacement.

## 2023-10-20 NOTE — Telephone Encounter (Signed)
Phone room: Please let pt know that is something she will have to notify her dme about! Thanks,  Production assistant, radio

## 2023-10-20 NOTE — Telephone Encounter (Signed)
I will order HST since needs new machine. Last was in 2014. Thanks

## 2023-10-20 NOTE — Telephone Encounter (Signed)
Called and informed pt. She states that she has had her machine for over 36yrs but will call the DME to be advised.

## 2023-10-20 NOTE — Telephone Encounter (Signed)
Pt called and stated that she called the DME regarding her overheating Cpap machine and they informed her that her sleep provider is needing to send in a prescription for her. Please advise.

## 2023-10-20 NOTE — Telephone Encounter (Signed)
Cpap machine ordered thru [facility]

## 2023-10-20 NOTE — Telephone Encounter (Signed)
Order sent to dme for new machine

## 2023-10-21 NOTE — Telephone Encounter (Signed)
Phone room: What exactly did the pt want clarified about new machine?  Please let pt know that an order was placed and her dme company should be arranging her to get one soon!  Thanks,  Production assistant, radio

## 2023-10-21 NOTE — Telephone Encounter (Signed)
Pt asking for call back to clarify about new CPAP machine.

## 2023-10-23 ENCOUNTER — Other Ambulatory Visit: Payer: Self-pay

## 2023-10-23 ENCOUNTER — Telehealth: Payer: Self-pay | Admitting: Neurology

## 2023-10-23 DIAGNOSIS — G4733 Obstructive sleep apnea (adult) (pediatric): Secondary | ICD-10-CM

## 2023-10-23 DIAGNOSIS — G40909 Epilepsy, unspecified, not intractable, without status epilepticus: Secondary | ICD-10-CM

## 2023-10-23 NOTE — Addendum Note (Signed)
Addended by: Glean Salvo on: 10/23/2023 01:11 PM   Modules accepted: Orders

## 2023-10-23 NOTE — Telephone Encounter (Signed)
Pt has called with the request to discuss her concern of thinking in her sleep.  Pt states she does not feel like this should be happening. Pleae call pt to discuss.

## 2023-10-23 NOTE — Telephone Encounter (Signed)
Unclear etiology of such vague complaint. She is getting HST coming up. We could do an overnight EEG if continues to check if any underlying seizure spells going on. Tahnks

## 2023-10-23 NOTE — Telephone Encounter (Signed)
Called and spoke to patient about "thinking in her sleep" she says its sort of like dreaming but she said that she was started on a new diabetic medication, RYBELSUS 3 MG TABS by another provider two weeks ago and now has had this problem for one night, She wants a doctor recommendation on what this could be and I advised her to reach out to the prescribing provider if she thinks this is related and she said that she would, but still wanted Brandi Gomez's opinion. No other new medication noted and no other changes to report.

## 2023-10-23 NOTE — Telephone Encounter (Signed)
Called and relayed information to patient who in response stated that she would like overnight eeg now if possible

## 2023-10-23 NOTE — Telephone Encounter (Signed)
I placed orders for ambulatory 48-hour EEG.

## 2023-10-28 ENCOUNTER — Telehealth: Payer: Self-pay | Admitting: Neurology

## 2023-10-28 DIAGNOSIS — G4733 Obstructive sleep apnea (adult) (pediatric): Secondary | ICD-10-CM

## 2023-10-28 NOTE — Telephone Encounter (Signed)
Addended CPAP order to reflect heated humidifier .  Needs new machine per DME due to overheating issue. We will continue current settings.  7-15 cm water, EPR 3  Mask of choice.   Orders Placed This Encounter  Procedures   For home use only DME continuous positive airway pressure (CPAP)

## 2023-10-28 NOTE — Telephone Encounter (Signed)
Community message sent to DME  

## 2023-11-03 ENCOUNTER — Encounter: Payer: Self-pay | Admitting: Neurology

## 2023-11-03 ENCOUNTER — Ambulatory Visit (INDEPENDENT_AMBULATORY_CARE_PROVIDER_SITE_OTHER): Payer: Medicare HMO | Admitting: Neurology

## 2023-11-03 VITALS — BP 134/55 | HR 63 | Ht 61.0 in | Wt 288.5 lb

## 2023-11-03 DIAGNOSIS — Z5181 Encounter for therapeutic drug level monitoring: Secondary | ICD-10-CM | POA: Diagnosis not present

## 2023-11-03 DIAGNOSIS — G40909 Epilepsy, unspecified, not intractable, without status epilepticus: Secondary | ICD-10-CM | POA: Diagnosis not present

## 2023-11-03 NOTE — Progress Notes (Signed)
Patient: Brandi Gomez Date of Birth: 01/17/1971  Reason for Visit: Follow up History from: Patient, significant other Primary Neurologist: Dr. Teresa Coombs seizures/Dr. Athar-OSA  ASSESSMENT AND PLAN 52 y.o. year old female   1.  Seizures -Continue with Lamotrigine 300 mg twice daily -Continue with Clobazam 10 mg nightly  -Does not think, she will be able to do the home EEG, will order a routine EEG -Will check Lamotrigine and Clobazam level with CMP -Use Nayzilam 5 mg as needed for rescue in acute seizure, sent in 02/18/23 -Call for any seizures, we will follow-up in 6 months or sooner if needed  No orders of the defined types were placed in this encounter.  HISTORY OF PRESENT ILLNESS: Today 11/03/23 Patient presents today for follow-up, he is accompanied by boyfriend.  Last visit was in April, at that time zonisamide was started.  Patient reports with zonisamide he was having hallucinations and abnormal behavior, thought that she was part of a cult.  Zonisamide  discontinued, lamotrigine increased to 300 mg twice daily and clobazam was started.  She reports compliance with the medications but felt like she had a possible seizure a week ago. She reported being in deep sleep and woke up with a headache.  When talking to her boyfriend he described the seizures as staring with right arm flailing which is different from the event that she experienced a week ago.  Ambulatory EEG was ordered but patient feels that she cannot do it at home, will most likely get a routine EEG in the office.   INTERVAL HISTORY 03/19/2023 Here today with boyfriend. On Zonegran 200 mg at bedtime, Lamictal 200 mg BID. For some reason she thinks the Zonegran is causing headaches, about 1 hour after taking. Home health came out, yesterday was last day. No seizure since last visit. Using CPAP nightly, full face, denies any leak of the mask, no problems with mask. ESS 12. Is compliant with CPAP 100%.  Full download is in  separate note.  AHI 0.9.  Some leak 32.6.  Update 02/18/23 SS: Here with her boyfriend [name]. In the ER 02/11/2023, reported 10-minute seizure was postictal on EMS arrival.  She was out of Lamictal likely since Jan per ER. Glucose 219, creatinine 1.43, Lamictal level 4.5, Zonegran was not detected.  Was supposed to be taking Zonegran 50 mg twice a day, Lamictal 200 mg morning/250 mg at bedtime.  She was given Lamictal starter pack. Currently taking Lamictal 25 mg once daily, Zonegran 25 mg 2 tablets daily. Also, had seizure last night, wearing CPAP, jerking all over. No urination or oral injury.  Her CPAP data of the last 30 days looks fine, 30/30 days, greater than 4 hours 28 days.  Minimum pressure 7, maximum 12.  Leak is 20.8.  AHI 0.9.  I called her daughter who manages her medications, indicates had been giving her Lamictal 225 mg in the morning, 25 mg at bedtime and the correct dose of Zonegran 50 mg twice a day  Update 08/05/22 SS: [name] is here today for follow-up.  Last saw Dr. Anne Hahn in August 2022.  She had had a recent seizure.  Zonegran was added to Lamictal. Last seizure was August 2022. Her daughter lives with her, manages her medicine.  Denies any side effects.  Is overdue to see her PCP.  Patient doesn't drive. Is on CPAP.  CPAP download shows excellent compliance 30 out of 30 at 100% greater than 4 hours usage.  Average usage  8 hours 59 minutes.  Minimum pressure 7, maximum pressure 12 cm water.  Leak is high at 54.7.  AHI 1.2.  Uses full facemask.   HISTORY  07/31/2021 Dr. Anne Hahn: Ms. Demory is a 52 year old right-handed black female with a history of bipolar disorder, diabetes, sleep apnea on CPAP, and intractable seizures.  The patient has a history of a right posterior frontal stroke in the past.  The patient was seen in the emergency room yesterday with a seizure event.  She comes in today with a family member.  The patient apparently took a nap and had a seizure  shortly thereafter, she does wear her CPAP during the naps.  The patient has not missed any of her lamotrigine.  She currently is taking 200 mg in the morning and 250 mg in the evening.  A lamotrigine level was checked through the emergency room but is still pending.  Urine drug screen was negative.  The patient has continued to gain weight over time, she is 5 feet 1 inches tall and weighs 317 pounds.  She comes here for further evaluation.  REVIEW OF SYSTEMS: Out of a complete 14 system review of symptoms, the patient complains only of the following symptoms, and all other reviewed systems are negative.  See HPI  ALLERGIES: Allergies  Allergen Reactions   Amoxicillin Itching    Did it involve swelling of the face/tongue/throat, SOB, or low BP? No Did it involve sudden or severe rash/hives, skin peeling, or any reaction on the inside of your mouth or nose? No Did you need to seek medical attention at a hospital or doctor's office? No When did it last happen?      within the past 10 years If all above answers are "NO", may proceed with cephalosporin use.    Lisinopril Swelling    Angioedema    Hydrocodone Other (See Comments)    Depressed    Tegretol [Carbamazepine] Swelling    Throat swells    HOME MEDICATIONS: Outpatient Medications Prior to Visit  Medication Sig Dispense Refill   ACCU-CHEK GUIDE test strip      acetaminophen (TYLENOL) 325 MG tablet Take 2 tablets (650 mg total) by mouth every 4 (four) hours as needed for mild pain (or temp > 37.5 C (99.5 F)).     ARIPiprazole (ABILIFY) 10 MG tablet Take 0.5 tablets (5 mg total) by mouth daily. 30 tablet 2   atorvastatin (LIPITOR) 40 MG tablet TAKE 1 TABLET (40 MG TOTAL) BY MOUTH DAILY AT 6 PM FOR CHOLESTEROL 90 tablet 0   carvedilol (COREG) 6.25 MG tablet Take 6.25 mg by mouth 2 (two) times daily.     cetirizine (ZYRTEC ALLERGY) 10 MG tablet Take 1 tablet (10 mg total) by mouth daily. 30 tablet 0   cloBAZam (ONFI) 10 MG tablet Take  1 tablet (10 mg total) by mouth at bedtime. 30 tablet 3   clonazePAM (KLONOPIN) 0.5 MG tablet Take 1 tablet (0.5 mg total) by mouth 2 (two) times daily as needed for up to 1 dose for anxiety. 1 tablet 0   ferrous sulfate 325 (65 FE) MG tablet Take 325 mg by mouth 2 (two) times daily.     folic acid (FOLVITE) 1 MG tablet Take 1 mg by mouth daily.     glipiZIDE (GLUCOTROL) 10 MG tablet Take 10 mg by mouth 2 (two) times daily.     hydrALAZINE (APRESOLINE) 10 MG tablet Take 1 tablet (10 mg total) by mouth 2 (two) times daily.  180 tablet 3   hydrOXYzine (VISTARIL) 25 MG capsule 1 QHS  1 qday PRN 60 capsule 5   isosorbide dinitrate (ISORDIL) 10 MG tablet Take 1 tablet (10 mg total) by mouth 2 (two) times daily. 180 tablet 3   lamoTRIgine (LAMICTAL) 100 MG tablet Take 3 tablets (300 mg total) by mouth 2 (two) times daily. 180 tablet 3   metroNIDAZOLE (FLAGYL) 500 MG tablet Take 1 tablet (500 mg total) by mouth 2 (two) times daily. 14 tablet 0   RYBELSUS 3 MG TABS Take 3 mg by mouth in the morning.  TAKE 1 PILL IN THE MORNING FOR DIABETES FOR 30 DAYS THEN INCREASE TO 7MG  DAILY     Vitamin D, Ergocalciferol, (DRISDOL) 1.25 MG (50000 UNIT) CAPS capsule Take 1 capsule (50,000 Units total) by mouth every 7 (seven) days. 12 capsule 0   No facility-administered medications prior to visit.    PAST MEDICAL HISTORY: Past Medical History:  Diagnosis Date   Anemia    Anxiety    Bipolar 1 disorder (HCC)    Common migraine 05/19/2015   Depression    Diabetes mellitus, type II (HCC)    Hypertension    Irritable bowel syndrome (IBS)    Mild mental retardation    Obesity    Partial complex seizure disorder with intractable epilepsy (HCC) 05/12/2014   Seizures (HCC)    intractable, sz 08/23/17   Sleep apnea    Stroke (HCC)    Type II or unspecified type diabetes mellitus without mention of complication, not stated as uncontrolled     PAST SURGICAL HISTORY: Past Surgical History:  Procedure Laterality Date    BUBBLE STUDY  11/15/2019   Procedure: BUBBLE STUDY;  Surgeon: Lars Masson, MD;  Location: [facility] ENDOSCOPY;  Service: Cardiovascular;;   COLONOSCOPY     2012-normal , Dr Jarold Motto   ESOPHAGOGASTRODUODENOSCOPY     normal-Dr Jarold Motto 2012   LOOP RECORDER INSERTION N/A 05/30/2017   Procedure: Loop Recorder Insertion;  Surgeon: Regan Lemming, MD;  Location: [facility] INVASIVE CV LAB;  Service: Cardiovascular;  Laterality: N/A;   LOOP RECORDER REMOVAL N/A 03/04/2018   Procedure: LOOP RECORDER REMOVAL;  Surgeon: Regan Lemming, MD;  Location: [facility] INVASIVE CV LAB;  Service: Cardiovascular;  Laterality: N/A;   MYRINGOTOMY WITH TUBE PLACEMENT     MYRINGOTOMY WITH TUBE PLACEMENT Right 11/05/2017   Procedure: MYRINGOTOMY WITH TUBE PLACEMENT;  Surgeon: Suzanna Obey, MD;  Location[facility] OR;  Service: ENT;  Laterality: Right;  right T Tube placement   NASAL SINUS SURGERY     TEE WITHOUT CARDIOVERSION N/A 05/30/2017   Procedure: TRANSESOPHAGEAL ECHOCARDIOGRAM (TEE);  Surgeon: Elease Hashimoto Deloris Ping, MD;  Location: [facility] ENDOSCOPY;  Service: Cardiovascular;  Laterality: N/A;   TEE WITHOUT CARDIOVERSION N/A 11/15/2019   Procedure: TRANSESOPHAGEAL ECHOCARDIOGRAM (TEE);  Surgeon: Lars Masson, MD;  Location: [facility] ENDOSCOPY;  Service: Cardiovascular;  Laterality: N/A;    FAMILY HISTORY: Family History  Problem Relation Age of Onset   Diabetes Mother        passed away from accidental death   Hypertension Mother    Bipolar disorder Father    Diabetes Daughter    Leukemia Daughter    Ovarian cancer Maternal Aunt     SOCIAL HISTORY: Social History   Socioeconomic History   Marital status: Single    Spouse name: [name]   Number of children: 3   Years of education: 12   Highest education level: Not on file  Occupational History  Occupation: disabled    Employer: DISABLED  Tobacco Use   Smoking status: Never   Smokeless tobacco: Never  Vaping Use   Vaping status: Never Used  Substance and  Sexual Activity   Alcohol use: No   Drug use: No   Sexual activity: Yes  Other Topics Concern   Not on file  Social History Narrative   Patient lives at home with daughter.    Patient has 3 children.    Patient is right handed.    Patient has a high school education.    Patient is on disability   Patient drinks 2 cups of caffeine daily.   Social Determinants of Health   Financial Resource Strain: Not on file  Food Insecurity: No Food Insecurity (12/26/2020)   Hunger Vital Sign    Worried About Running Out of Food in the Last Year: Never true    Ran Out of Food in the Last Year: Never true  Transportation Needs: No Transportation Needs (11/06/2020)   PRAPARE - Administrator, Civil Service (Medical): No    Lack of Transportation (Non-Medical): No  Physical Activity: Not on file  Stress: Not on file  Social Connections: Not on file  Intimate Partner Violence: Not on file   PHYSICAL EXAM  Vitals:   11/03/23 1529  BP: (!) 134/55  Pulse: 63  Weight: 288 lb 8 oz (130.9 kg)  Height: 5\' 1"  (1.549 m)   Body mass index is 54.51 kg/m.  Generalized: Well developed, in no acute distress, obese Neurological examination  Mentation: Alert oriented to time, place, history taking, is a poor historian, relies on family members for history, follows exam commands Cranial nerve II-XII: Pupils were equal round reactive to light. Extraocular movements were full, visual field were full on confrontational test. Facial sensation and strength were normal.  Head turning and shoulder shrug  were normal and symmetric. Motor: The motor testing reveals 5 over 5 strength of all 4 extremities. Good symmetric motor tone is noted throughout.  Sensory: Sensory testing is intact to soft touch on all 4 extremities. No evidence of extinction is noted.  Coordination: Cerebellar testing reveals good finger-nose-finger and heel-to-shin bilaterally.  Gait and station: Gait is normal, but limited by  large body habitus, is independent  DIAGNOSTIC DATA (LABS, IMAGING, TESTING) - I reviewed patient records, labs, notes, testing and imaging myself where available.  Lab Results  Component Value Date   WBC 7.1 08/10/2023   HGB 11.0 (L) 08/10/2023   HCT 36.4 08/10/2023   MCV 100.6 (H) 08/10/2023   PLT 292 08/10/2023      Component Value Date/Time   NA 134 (L) 08/10/2023 1900   NA 139 07/02/2023 1200   K 3.9 08/10/2023 1900   CL 103 08/10/2023 1900   CO2 23 08/10/2023 1900   GLUCOSE 176 (H) 08/10/2023 1900   BUN 11 08/10/2023 1900   BUN 17 07/02/2023 1200   CREATININE 1.37 (H) 08/10/2023 1900   CREATININE 0.72 01/30/2015 1241   CALCIUM 8.3 (L) 08/10/2023 1900   PROT 7.3 07/02/2023 1200   ALBUMIN 4.2 07/02/2023 1200   AST 12 07/02/2023 1200   ALT 9 07/02/2023 1200   ALKPHOS 117 07/02/2023 1200   BILITOT 0.3 07/02/2023 1200   GFRNONAA 46 (L) 08/10/2023 1900   GFRNONAA >89 01/30/2015 1241   GFRAA 56 (L) 01/04/2021 1115   GFRAA >89 01/30/2015 1241   Lab Results  Component Value Date   CHOL 108 04/18/2020   HDL  52 04/18/2020   LDLCALC 44 04/18/2020   TRIG 100 10/31/2020   CHOLHDL 2.1 04/18/2020   Lab Results  Component Value Date   HGBA1C 7.9 (A) 01/18/2021   Lab Results  Component Value Date   VITAMINB12 379 05/13/2014   Lab Results  Component Value Date   TSH 1.220 02/10/2018    Windell Norfolk, MD 11/03/2023, 5:08 PM [facility] [address] [place], [place] [address] [phone]

## 2023-11-10 ENCOUNTER — Other Ambulatory Visit: Payer: Self-pay | Admitting: Registered Nurse

## 2023-11-10 DIAGNOSIS — N6320 Unspecified lump in the left breast, unspecified quadrant: Secondary | ICD-10-CM

## 2023-11-10 LAB — CLOBAZAM
Clobazam: 186 ng/mL (ref 30–300)
Desmethylclobazam: 868 ng/mL (ref 300–3000)

## 2023-11-10 LAB — COMPREHENSIVE METABOLIC PANEL
ALT: 14 [IU]/L (ref 0–32)
AST: 14 [IU]/L (ref 0–40)
Albumin: 4 g/dL (ref 3.8–4.9)
Alkaline Phosphatase: 130 [IU]/L — ABNORMAL HIGH (ref 44–121)
BUN/Creatinine Ratio: 14 (ref 9–23)
BUN: 26 mg/dL — ABNORMAL HIGH (ref 6–24)
Bilirubin Total: <0.2 mg/dL (ref 0.0–1.2)
CO2: 24 mmol/L (ref 20–29)
Calcium: 9.2 mg/dL (ref 8.7–10.2)
Chloride: 100 mmol/L (ref 96–106)
Creatinine, Ser: 1.85 mg/dL — ABNORMAL HIGH (ref 0.57–1.00)
Globulin, Total: 3.2 g/dL (ref 1.5–4.5)
Glucose: 184 mg/dL — ABNORMAL HIGH (ref 70–99)
Potassium: 4.8 mmol/L (ref 3.5–5.2)
Sodium: 141 mmol/L (ref 134–144)
Total Protein: 7.2 g/dL (ref 6.0–8.5)
eGFR: 32 mL/min/{1.73_m2} — ABNORMAL LOW (ref >59)

## 2023-11-10 LAB — LAMOTRIGINE LEVEL: Lamotrigine Lvl: 10.7 ug/mL (ref 2.0–20.0)

## 2023-11-10 NOTE — Progress Notes (Signed)
Please call and advise the patient that the recent antiseizure levels we checked were within normal limits but her labs showed evidence of worsening kidney function. Please advise her to follow up with PCP regarding her kidney function.  Please remind patient to keep any upcoming appointments or tests and to call us with any interim questions, concerns, problems or updates. Thanks,   Windell Norfolk, MD

## 2023-11-14 ENCOUNTER — Other Ambulatory Visit: Payer: Self-pay | Admitting: Registered Nurse

## 2023-11-14 ENCOUNTER — Ambulatory Visit
Admission: RE | Admit: 2023-11-14 | Discharge: 2023-11-14 | Disposition: A | Discharge status: Home / Self Care | Payer: Medicare HMO | Source: Ambulatory Visit | Attending: Registered Nurse

## 2023-11-14 DIAGNOSIS — N6310 Unspecified lump in the right breast, unspecified quadrant: Secondary | ICD-10-CM

## 2023-11-14 DIAGNOSIS — N6320 Unspecified lump in the left breast, unspecified quadrant: Secondary | ICD-10-CM

## 2023-11-17 ENCOUNTER — Telehealth: Payer: Self-pay | Admitting: *Deleted

## 2023-11-17 NOTE — Telephone Encounter (Signed)
   Name: Brandi Gomez  DOB: [birth year]  MRN: [id number]  Primary Cardiologist: Chilton Si, MD  Chart reviewed as part of pre-operative protocol coverage. Because of [name]'s past medical history and time since last visit, she will require a follow-up in-office visit in order to better assess preoperative cardiovascular risk.  Patient has a new patient office visit scheduled on 01/13/2024 with Dr. [place]. Appointment notes have been updated to reflect need for pre-op evaluation.   Pre-op covering staff:  - Please contact requesting surgeon's office via preferred method (i.e, phone, fax) to inform them of need for appointment prior to surgery.  Carlos Levering, NP  11/17/2023, 1:56 PM

## 2023-11-17 NOTE — Telephone Encounter (Signed)
   Pre-operative Risk Assessment    Patient Name: Brandi Gomez  DOB: [birth year] MRN: [id number]      Request for Surgical Clearance    Procedure:   COLONOCOPY  Date of Surgery:  Clearance TBD                                 Surgeon:   Surgeon's Group or Practice Name:  [facility] Phone number:  [phone] Fax number:  [phone]   Type of Clearance Requested:   - Medical    Type of Anesthesia:   PROPOFOL   Additional requests/questions:    Wilhemina Cash   11/17/2023, 6:56 AM

## 2023-11-18 ENCOUNTER — Telehealth: Payer: Self-pay | Admitting: Neurology

## 2023-11-18 ENCOUNTER — Ambulatory Visit (HOSPITAL_COMMUNITY): Payer: Medicare HMO | Admitting: Psychiatry

## 2023-11-18 NOTE — Telephone Encounter (Signed)
Pt called to confirm EEG details

## 2023-11-18 NOTE — Telephone Encounter (Signed)
S/w pt is ok with waiting for Dr. Chilton Si upcoming appt in February to  be cleared for colonoscopy. Pre op added to appt notes. Will remove from pre op pool.

## 2023-11-24 ENCOUNTER — Other Ambulatory Visit (HOSPITAL_COMMUNITY): Payer: Self-pay | Admitting: Psychiatry

## 2023-12-09 ENCOUNTER — Other Ambulatory Visit: Payer: Self-pay | Admitting: Neurology

## 2023-12-09 NOTE — Telephone Encounter (Signed)
 Requested Prescriptions   Pending Prescriptions Disp Refills   cloBAZam  (ONFI ) 10 MG tablet [Pharmacy Med Name: CLOBAZAM  10 MG TABLET] 30 tablet 3    Sig: TAKE 1 TABLET BY MOUTH EVERYDAY AT BEDTIME   Last seen 11/03/23, next appt 12/15/23  Dispenses    Dispensed Days Supply Quantity Provider Pharmacy  CLOBAZAM  10 MG TABLET 11/11/2023 30 30 each Gayland Lauraine PARAS, NP [facility] [phone] - [facility]...  CLOBAZAM  10 MG TABLET 10/13/2023 30 30 each Gayland Lauraine PARAS, NP [facility] [phone] - G...  CLOBAZAM  10 MG TABLET 09/15/2023 30 30 each Gayland Lauraine PARAS, NP [facility] [phone] - G...  CLOBAZAM  10 MG TABLET 08/12/2023 30 30 each Gayland Lauraine PARAS, NP [facility] [phone] - G.[place]

## 2023-12-15 ENCOUNTER — Ambulatory Visit (INDEPENDENT_AMBULATORY_CARE_PROVIDER_SITE_OTHER): Payer: Medicare HMO | Admitting: Neurology

## 2023-12-15 DIAGNOSIS — G40909 Epilepsy, unspecified, not intractable, without status epilepticus: Secondary | ICD-10-CM | POA: Diagnosis not present

## 2023-12-15 NOTE — Procedures (Signed)
    History:  53 year old woman with seizure disorder   EEG classification: Awake and drowsy  Duration: 30 minutes   Technical aspects: This EEG study was done with scalp electrodes positioned according to the 10-20 International system of electrode placement. Electrical activity was reviewed with band pass filter of 1-70Hz , sensitivity of 7 uV/mm, display speed of 95mm/sec with a 60Hz  notched filter applied as appropriate. EEG data were recorded continuously and digitally stored.   Description of the recording: The background rhythms of this recording consists of a fairly well modulated medium amplitude alpha rhythm of 9 Hz that is reactive to eye opening and closure. Present in the anterior head region is a 15-20 Hz beta activity. Photic stimulation was performed, did not show any abnormalities. Hyperventilation was also performed, did not show any abnormalities. Drowsiness was manifested by background fragmentation. No abnormal epileptiform discharges seen during this recording. There was no focal slowing. There were no electrographic seizure identified.   Abnormality: None   Impression: This is a normal EEG recorded while drowsy and awake. No evidence of interictal epileptiform discharges. Normal EEGs, however, do not rule out epilepsy.    Hagop Mccollam, MD [facility]

## 2023-12-16 NOTE — Progress Notes (Signed)
 Please call and inform patient that her EEG (Brain wave test) was normal. In particular, there were no epileptiform discharges and no seizures. No further action is required on this test at this time. Please continue current medications and keep any upcoming appointments or tests and  call us  with any interim questions, concerns, problems or updates. Thanks,   Pastor Falling, MD

## 2024-01-11 NOTE — Progress Notes (Signed)
 Cardiology Office Note:    Date:  01/14/2024   ID:  Brandi Gomez, DOB 05/17/1971, MRN [id number]  PCP:  Leontine Cramp, NP   [facility] Providers Cardiologist:  Annabella Scarce, MD Electrophysiologist:  Will Gladis Norton, MD     Referring MD: No ref. provider found   Chief Complaint  Patient presents with   Congestive Heart Failure    History of Present Illness:    Brandi Gomez is a 53 y.o. female is seen at the request of Dr Nguyen  For clearance for colonoscopy.  She has a history of CHF. Last seen in 11/2019 and formerly seen by Dr Scarce. She has a history of chronic systolic and diastolic heart failure, hypertension, hyperlipidemia, morbid obesity, CKD3, OSA on CPAP. She was seen in November 2020 with recurrent CVA. Echo at that time showed EF 30-35%. She had prior ILR without any arrhythmia noted. History of angioedema on ACEi so has not been on ARB or ARNI.  She is planning to have colonoscopy. Doesn't know with who. She states she has gained over 30 lbs in the past year and does note some SOB with that but no edema, palpitations or chest pain. On hydralazine , Coreg , and nitrates. No diuretics currently. Memory is poor.   Past Medical History:  Diagnosis Date   Anemia    Anxiety    Bipolar 1 disorder (HCC)    Common migraine 05/19/2015   Depression    Diabetes mellitus, type II (HCC)    Hypertension    Irritable bowel syndrome (IBS)    Mild mental retardation    Obesity    Partial complex seizure disorder with intractable epilepsy (HCC) 05/12/2014   Seizures (HCC)    intractable, sz 08/23/17   Sleep apnea    Stroke (HCC)    Type II or unspecified type diabetes mellitus without mention of complication, not stated as uncontrolled     Past Surgical History:  Procedure Laterality Date   BUBBLE STUDY  11/15/2019   Procedure: BUBBLE STUDY;  Surgeon: Maranda Leim DEL, MD;  Location: [facility] ENDOSCOPY;  Service: Cardiovascular;;   COLONOSCOPY      2012-normal , Dr Jakie   ESOPHAGOGASTRODUODENOSCOPY     normal-Dr Jakie 2012   LOOP RECORDER INSERTION N/A 05/30/2017   Procedure: Loop Recorder Insertion;  Surgeon: Norton Soyla Gladis, MD;  Location: [facility] INVASIVE CV LAB;  Service: Cardiovascular;  Laterality: N/A;   LOOP RECORDER REMOVAL N/A 03/04/2018   Procedure: LOOP RECORDER REMOVAL;  Surgeon: Norton Soyla Gladis, MD;  Location: [facility] INVASIVE CV LAB;  Service: Cardiovascular;  Laterality: N/A;   MYRINGOTOMY WITH TUBE PLACEMENT     MYRINGOTOMY WITH TUBE PLACEMENT Right 11/05/2017   Procedure: MYRINGOTOMY WITH TUBE PLACEMENT;  Surgeon: Roark Rush, MD;  Location: [facility] OR;  Service: ENT;  Laterality: Right;  right T Tube placement   NASAL SINUS SURGERY     TEE WITHOUT CARDIOVERSION N/A 05/30/2017   Procedure: TRANSESOPHAGEAL ECHOCARDIOGRAM (TEE);  Surgeon: Alveta Aleene PARAS, MD;  Location: [facility] ENDOSCOPY;  Service: Cardiovascular;  Laterality: N/A;   TEE WITHOUT CARDIOVERSION N/A 11/15/2019   Procedure: TRANSESOPHAGEAL ECHOCARDIOGRAM (TEE);  Surgeon: Maranda Leim DEL, MD;  Location: [facility] ENDOSCOPY;  Service: Cardiovascular;  Laterality: N/A;    Current Medications: Current Meds  Medication Sig   acetaminophen  (TYLENOL ) 325 MG tablet Take 2 tablets (650 mg total) by mouth every 4 (four) hours as needed for mild pain (or temp > 37.5 C (99.5 F)).   ARIPiprazole  (ABILIFY ) 10  MG tablet Take 0.5 tablets (5 mg total) by mouth daily.   atorvastatin  (LIPITOR) 40 MG tablet TAKE 1 TABLET (40 MG TOTAL) BY MOUTH DAILY AT 6 PM FOR CHOLESTEROL   carvedilol  (COREG ) 6.25 MG tablet Take 6.25 mg by mouth 2 (two) times daily.   clonazePAM  (KLONOPIN ) 0.5 MG tablet Take 1 tablet (0.5 mg total) by mouth 2 (two) times daily as needed for up to 1 dose for anxiety.   ferrous sulfate  325 (65 FE) MG tablet Take 325 mg by mouth 2 (two) times daily.   folic acid  (FOLVITE ) 1 MG tablet Take 1 mg by mouth daily.   glipiZIDE  (GLUCOTROL ) 10 MG tablet Take 10 mg by mouth 2  (two) times daily.   hydrALAZINE  (APRESOLINE ) 10 MG tablet Take 1 tablet (10 mg total) by mouth 2 (two) times daily.   hydrOXYzine  (VISTARIL ) 25 MG capsule 1 QHS  1 qday PRN   isosorbide  dinitrate (ISORDIL ) 10 MG tablet Take 1 tablet (10 mg total) by mouth 2 (two) times daily.   lamoTRIgine  (LAMICTAL ) 100 MG tablet TAKE 3 TABLETS (300 MG TOTAL) BY MOUTH 2 (TWO) TIMES DAILY.   Vitamin D , Ergocalciferol , (DRISDOL ) 1.25 MG (50000 UNIT) CAPS capsule Take 1 capsule (50,000 Units total) by mouth every 7 (seven) days.     Allergies:   Amoxicillin , Lisinopril , Hydrocodone , and Tegretol  [carbamazepine ]   Social History   Socioeconomic History   Marital status: Single    Spouse name: Elsie   Number of children: 3   Years of education: 12   Highest education level: Not on file  Occupational History   Occupation: disabled    Employer: DISABLED  Tobacco Use   Smoking status: Never   Smokeless tobacco: Never  Vaping Use   Vaping status: Never Used  Substance and Sexual Activity   Alcohol use: No   Drug use: No   Sexual activity: Yes  Other Topics Concern   Not on file  Social History Narrative   Patient lives at home with daughter.    Patient has 3 children.    Patient is right handed.    Patient has a high school education.    Patient is on disability   Patient drinks 2 cups of caffeine daily.   Social Drivers of Corporate Investment Banker Strain: Not on file  Food Insecurity: No Food Insecurity (12/26/2020)   Hunger Vital Sign    Worried About Running Out of Food in the Last Year: Never true    Ran Out of Food in the Last Year: Never true  Transportation Needs: No Transportation Needs (11/06/2020)   PRAPARE - Administrator, Civil Service (Medical): No    Lack of Transportation (Non-Medical): No  Physical Activity: Not on file  Stress: Not on file  Social Connections: Not on file     Family History: The patient's family history includes Bipolar disorder in  her father; Diabetes in her daughter and mother; Hypertension in her mother; Leukemia in her daughter; Ovarian cancer in her maternal aunt.  ROS:   Please see the history of present illness.     All other systems reviewed and are negative.  EKGs/Labs/Other Studies Reviewed:    The following studies were reviewed today: EKG Interpretation Date/Time:  Wednesday January 14 2024 14:02:05 EST Ventricular Rate:  92 PR Interval:  178 QRS Duration:  88 QT Interval:  368 QTC Calculation: 455 R Axis:   88  Text Interpretation: Normal sinus rhythm Nonspecific T wave  abnormality When compared with ECG of 25-Mar-2023 18:34, No significant change was found Confirmed by Jolissa Kapral [phone]) on 01/14/2024 2:03:15 PM   EKG Interpretation Date/Time:  Wednesday January 14 2024 14:02:05 EST Ventricular Rate:  92 PR Interval:  178 QRS Duration:  88 QT Interval:  368 QTC Calculation: 455 R Axis:   88  Text Interpretation: Normal sinus rhythm Nonspecific T wave abnormality When compared with ECG of 25-Mar-2023 18:34, No significant change was found Confirmed by Solan Vosler [phone]) on 01/14/2024 2:03:15 PM    Recent Labs: 08/10/2023: Hemoglobin 11.0; Magnesium  2.2; Platelets 292 11/03/2023: ALT 14; BUN 26; Creatinine, Ser 1.85; Potassium 4.8; Sodium 141  Recent Lipid Panel    Component Value Date/Time   CHOL 108 04/18/2020 1142   TRIG 100 10/31/2020 1124   HDL 52 04/18/2020 1142   CHOLHDL 2.1 04/18/2020 1142   CHOLHDL 2.5 10/12/2019 0319   VLDL 10 10/12/2019 0319   LDLCALC 44 04/18/2020 1142     Risk Assessment/Calculations:                Physical Exam:    VS:  BP 128/86   Pulse 91   Ht 5' 3 (1.6 m)   Wt 297 lb 9.6 oz (135 kg)   LMP  (LMP Unknown)   SpO2 94%   BMI 52.72 kg/m     Wt Readings from Last 3 Encounters:  01/14/24 297 lb 9.6 oz (135 kg)  11/03/23 288 lb 8 oz (130.9 kg)  03/25/23 272 lb 7.8 oz (123.6 kg)     GEN:  Well nourished, morbidly obese BF in no acute  distress HEENT: Normal NECK: No JVD; No carotid bruits LYMPHATICS: No lymphadenopathy CARDIAC: RRR, no murmurs, rubs, gallops RESPIRATORY:  Clear to auscultation without rales, wheezing or rhonchi  ABDOMEN: Soft, non-tender, non-distended MUSCULOSKELETAL:  No edema; No deformity  SKIN: Warm and dry NEUROLOGIC:  Alert and oriented x 3 PSYCHIATRIC:  Normal affect   ASSESSMENT:    1. Chronic combined systolic and diastolic heart failure (HCC)   2. Hypertension associated with diabetes (HCC)   3. Type 2 diabetes mellitus without complication, without long-term current use of insulin  (HCC)    PLAN:    In order of problems listed above:  Chronic combined systolic/diastolic CHF. Prior EF 30-35%. Etiology likely due to longstanding DM and HTN. No prior ischemic work up that I can see. Does not appear to be volume overloaded. On isosorbide , hydralazine  and Coreg . Did not tolerate higher beta blocker dose in the past due to bradycardia. History of angioedema on lisinopril  so will not use ARB or ARNI. Could consider adding SGLT 2 inhibitor. Will update Echo. If stable or improved will clear to proceed with colonoscopy.  DM type 2 Morbid obesity with OSA on CPAP HTN controlled.            Medication Adjustments/Labs and Tests Ordered: Current medicines are reviewed at length with the patient today.  Concerns regarding medicines are outlined above.  Orders Placed This Encounter  Procedures   EKG 12-Lead   ECHOCARDIOGRAM COMPLETE   No orders of the defined types were placed in this encounter.   Patient Instructions  Medication Instructions:  Continue same medications *If you need a refill on your cardiac medications before your next appointment, please call your pharmacy*   Lab Work: None ordered   Testing/Procedures: Echo   first available    Follow-Up: At [facility], you and your health needs are our priority.  As  part of our continuing mission to provide you  with exceptional heart care, we have created designated Provider Care Teams.  These Care Teams include your primary Cardiologist (physician) and Advanced Practice Providers (APPs -  Physician Assistants and Nurse Practitioners) who all work together to provide you with the care you need, when you need it.  We recommend signing up for the patient portal called MyChart.  Sign up information is provided on this After Visit Summary.  MyChart is used to connect with patients for Virtual Visits (Telemedicine).  Patients are able to view lab/test results, encounter notes, upcoming appointments, etc.  Non-urgent messages can be sent to your provider as well.   To learn more about what you can do with MyChart, go to [email or web address].    Your next appointment:  1 year     Call in Oct to schedule Feb appointment    Provider:  Dr.Avielle Imbert          Signed, Rafaella Kole, MD  01/14/2024 2:19 PM    [facility]

## 2024-01-13 ENCOUNTER — Telehealth: Payer: Self-pay | Admitting: Neurology

## 2024-01-13 ENCOUNTER — Ambulatory Visit: Payer: Medicare HMO | Admitting: Cardiology

## 2024-01-13 DIAGNOSIS — G40909 Epilepsy, unspecified, not intractable, without status epilepticus: Secondary | ICD-10-CM

## 2024-01-13 DIAGNOSIS — G4733 Obstructive sleep apnea (adult) (pediatric): Secondary | ICD-10-CM

## 2024-01-13 NOTE — Telephone Encounter (Signed)
In November 2024, I ordered a new CPAP for overheating issue and HST. Looks like she no showed 11/18/23, can she get her HST? Did she get a new machine when I ordered last year for overheating issue.

## 2024-01-13 NOTE — Telephone Encounter (Signed)
Called pt and she stated that its currently working, advised her would have to complete hst and someone from that team will reach out to schedule. Pt voiced gratitude and understanding.

## 2024-01-13 NOTE — Telephone Encounter (Signed)
Pt said has had CPAP machine for 5 years as of February. Called [facility] and was told would need a referral in order to get a new machine.

## 2024-01-14 ENCOUNTER — Ambulatory Visit: Payer: Medicare HMO | Attending: Cardiology | Admitting: Cardiology

## 2024-01-14 ENCOUNTER — Encounter: Payer: Self-pay | Admitting: Cardiology

## 2024-01-14 VITALS — BP 128/86 | HR 91 | Ht 63.0 in | Wt 297.6 lb

## 2024-01-14 DIAGNOSIS — E1159 Type 2 diabetes mellitus with other circulatory complications: Secondary | ICD-10-CM | POA: Diagnosis not present

## 2024-01-14 DIAGNOSIS — I152 Hypertension secondary to endocrine disorders: Secondary | ICD-10-CM

## 2024-01-14 DIAGNOSIS — E119 Type 2 diabetes mellitus without complications: Secondary | ICD-10-CM | POA: Diagnosis not present

## 2024-01-14 DIAGNOSIS — I5042 Chronic combined systolic (congestive) and diastolic (congestive) heart failure: Secondary | ICD-10-CM

## 2024-01-14 NOTE — Patient Instructions (Signed)
 Medication Instructions:  Continue same medications *If you need a refill on your cardiac medications before your next appointment, please call your pharmacy*   Lab Work: None ordered   Testing/Procedures: Echo  first available   Follow-Up: At [facility], you and your health needs are our priority.  As part of our continuing mission to provide you with exceptional heart care, we have created designated Provider Care Teams.  These Care Teams include your primary Cardiologist (physician) and Advanced Practice Providers (APPs -  Physician Assistants and Nurse Practitioners) who all work together to provide you with the care you need, when you need it.  We recommend signing up for the patient portal called "MyChart".  Sign up information is provided on this After Visit Summary.  MyChart is used to connect with patients for Virtual Visits (Telemedicine).  Patients are able to view lab/test results, encounter notes, upcoming appointments, etc.  Non-urgent messages can be sent to your provider as well.   To learn more about what you can do with MyChart, go to [email or web address].    Your next appointment:  1 year   Call in Oct to schedule Feb appointment     Provider:  Dr.Jordan

## 2024-01-20 NOTE — Telephone Encounter (Signed)
Orders Placed This Encounter  Procedures   Nocturnal polysomnography

## 2024-01-20 NOTE — Addendum Note (Signed)
Addended by: Glean Salvo on: 01/20/2024 10:18 AM   Modules accepted: Orders

## 2024-01-21 NOTE — Telephone Encounter (Signed)
Noted, thank you.   [employer] Berkley Harvey: [id number] (exp. 01/21/24 to 05/05/24) & medicaid no Berkley Harvey req

## 2024-01-26 ENCOUNTER — Telehealth: Payer: Self-pay | Admitting: Neurology

## 2024-01-26 NOTE — Telephone Encounter (Signed)
 Error

## 2024-01-26 NOTE — Telephone Encounter (Signed)
 I spoke with the patient.  NPSG Ethlyn Gallery: [id number] (exp. 01/21/24 to 05/05/24)   Patient is scheduled at [facility] for 02/17/24 at 9 pm.  Mailed packet to the patient.

## 2024-02-02 ENCOUNTER — Ambulatory Visit (INDEPENDENT_AMBULATORY_CARE_PROVIDER_SITE_OTHER): Payer: Medicare HMO

## 2024-02-02 ENCOUNTER — Ambulatory Visit (HOSPITAL_COMMUNITY)
Admission: EM | Admit: 2024-02-02 | Discharge: 2024-02-02 | Disposition: A | Discharge status: Home / Self Care | Payer: Medicare HMO | Attending: Family Medicine | Admitting: Family Medicine

## 2024-02-02 ENCOUNTER — Encounter (HOSPITAL_COMMUNITY): Payer: Self-pay

## 2024-02-02 ENCOUNTER — Other Ambulatory Visit: Payer: Self-pay

## 2024-02-02 ENCOUNTER — Inpatient Hospital Stay (HOSPITAL_COMMUNITY)
Admission: EM | Admit: 2024-02-02 | Discharge: 2024-02-08 | DRG: 291 | Disposition: A | Discharge status: Home / Self Care | Payer: Medicare HMO | Attending: Family Medicine | Admitting: Family Medicine

## 2024-02-02 ENCOUNTER — Emergency Department (HOSPITAL_COMMUNITY): Payer: Medicare HMO

## 2024-02-02 DIAGNOSIS — Z888 Allergy status to other drugs, medicaments and biological substances status: Secondary | ICD-10-CM

## 2024-02-02 DIAGNOSIS — E119 Type 2 diabetes mellitus without complications: Secondary | ICD-10-CM

## 2024-02-02 DIAGNOSIS — Z8673 Personal history of transient ischemic attack (TIA), and cerebral infarction without residual deficits: Secondary | ICD-10-CM

## 2024-02-02 DIAGNOSIS — J9621 Acute and chronic respiratory failure with hypoxia: Secondary | ICD-10-CM | POA: Diagnosis present

## 2024-02-02 DIAGNOSIS — I429 Cardiomyopathy, unspecified: Secondary | ICD-10-CM | POA: Diagnosis present

## 2024-02-02 DIAGNOSIS — I130 Hypertensive heart and chronic kidney disease with heart failure and stage 1 through stage 4 chronic kidney disease, or unspecified chronic kidney disease: Secondary | ICD-10-CM | POA: Diagnosis present

## 2024-02-02 DIAGNOSIS — E1122 Type 2 diabetes mellitus with diabetic chronic kidney disease: Secondary | ICD-10-CM | POA: Diagnosis present

## 2024-02-02 DIAGNOSIS — J9601 Acute respiratory failure with hypoxia: Secondary | ICD-10-CM

## 2024-02-02 DIAGNOSIS — Z7984 Long term (current) use of oral hypoglycemic drugs: Secondary | ICD-10-CM

## 2024-02-02 DIAGNOSIS — Z885 Allergy status to narcotic agent status: Secondary | ICD-10-CM

## 2024-02-02 DIAGNOSIS — R051 Acute cough: Secondary | ICD-10-CM

## 2024-02-02 DIAGNOSIS — Z9981 Dependence on supplemental oxygen: Secondary | ICD-10-CM | POA: Diagnosis not present

## 2024-02-02 DIAGNOSIS — D72829 Elevated white blood cell count, unspecified: Secondary | ICD-10-CM | POA: Diagnosis present

## 2024-02-02 DIAGNOSIS — E785 Hyperlipidemia, unspecified: Secondary | ICD-10-CM | POA: Diagnosis present

## 2024-02-02 DIAGNOSIS — I700 Atherosclerosis of aorta: Secondary | ICD-10-CM | POA: Diagnosis present

## 2024-02-02 DIAGNOSIS — I5023 Acute on chronic systolic (congestive) heart failure: Secondary | ICD-10-CM | POA: Diagnosis present

## 2024-02-02 DIAGNOSIS — F319 Bipolar disorder, unspecified: Secondary | ICD-10-CM | POA: Diagnosis present

## 2024-02-02 DIAGNOSIS — E66813 Obesity, class 3: Secondary | ICD-10-CM | POA: Diagnosis present

## 2024-02-02 DIAGNOSIS — R0902 Hypoxemia: Secondary | ICD-10-CM | POA: Diagnosis not present

## 2024-02-02 DIAGNOSIS — I2489 Other forms of acute ischemic heart disease: Secondary | ICD-10-CM | POA: Diagnosis present

## 2024-02-02 DIAGNOSIS — Z8249 Family history of ischemic heart disease and other diseases of the circulatory system: Secondary | ICD-10-CM

## 2024-02-02 DIAGNOSIS — R000 Tachycardia, unspecified: Secondary | ICD-10-CM | POA: Diagnosis present

## 2024-02-02 DIAGNOSIS — Z6841 Body Mass Index (BMI) 40.0 and over, adult: Secondary | ICD-10-CM | POA: Diagnosis not present

## 2024-02-02 DIAGNOSIS — J189 Pneumonia, unspecified organism: Secondary | ICD-10-CM

## 2024-02-02 DIAGNOSIS — N1831 Chronic kidney disease, stage 3a: Secondary | ICD-10-CM | POA: Diagnosis present

## 2024-02-02 DIAGNOSIS — Z79899 Other long term (current) drug therapy: Secondary | ICD-10-CM | POA: Diagnosis not present

## 2024-02-02 DIAGNOSIS — Z87440 Personal history of urinary (tract) infections: Secondary | ICD-10-CM

## 2024-02-02 DIAGNOSIS — I509 Heart failure, unspecified: Principal | ICD-10-CM

## 2024-02-02 DIAGNOSIS — G4733 Obstructive sleep apnea (adult) (pediatric): Secondary | ICD-10-CM | POA: Diagnosis present

## 2024-02-02 DIAGNOSIS — G40209 Localization-related (focal) (partial) symptomatic epilepsy and epileptic syndromes with complex partial seizures, not intractable, without status epilepticus: Secondary | ICD-10-CM | POA: Diagnosis present

## 2024-02-02 DIAGNOSIS — Z818 Family history of other mental and behavioral disorders: Secondary | ICD-10-CM

## 2024-02-02 DIAGNOSIS — R7989 Other specified abnormal findings of blood chemistry: Secondary | ICD-10-CM | POA: Diagnosis present

## 2024-02-02 DIAGNOSIS — F70 Mild intellectual disabilities: Secondary | ICD-10-CM | POA: Diagnosis present

## 2024-02-02 DIAGNOSIS — F419 Anxiety disorder, unspecified: Secondary | ICD-10-CM | POA: Diagnosis present

## 2024-02-02 DIAGNOSIS — G43009 Migraine without aura, not intractable, without status migrainosus: Secondary | ICD-10-CM | POA: Diagnosis present

## 2024-02-02 DIAGNOSIS — E538 Deficiency of other specified B group vitamins: Secondary | ICD-10-CM | POA: Diagnosis present

## 2024-02-02 DIAGNOSIS — R0602 Shortness of breath: Secondary | ICD-10-CM | POA: Diagnosis present

## 2024-02-02 DIAGNOSIS — I10 Essential (primary) hypertension: Secondary | ICD-10-CM | POA: Diagnosis not present

## 2024-02-02 DIAGNOSIS — I77819 Aortic ectasia, unspecified site: Secondary | ICD-10-CM | POA: Diagnosis present

## 2024-02-02 DIAGNOSIS — R1110 Vomiting, unspecified: Secondary | ICD-10-CM | POA: Diagnosis present

## 2024-02-02 DIAGNOSIS — Z833 Family history of diabetes mellitus: Secondary | ICD-10-CM

## 2024-02-02 DIAGNOSIS — K589 Irritable bowel syndrome without diarrhea: Secondary | ICD-10-CM | POA: Diagnosis present

## 2024-02-02 DIAGNOSIS — Z880 Allergy status to penicillin: Secondary | ICD-10-CM

## 2024-02-02 DIAGNOSIS — N183 Chronic kidney disease, stage 3 unspecified: Secondary | ICD-10-CM | POA: Diagnosis present

## 2024-02-02 LAB — BASIC METABOLIC PANEL
Anion gap: 10 (ref 5–15)
BUN: 11 mg/dL (ref 6–20)
CO2: 24 mmol/L (ref 22–32)
Calcium: 8.6 mg/dL — ABNORMAL LOW (ref 8.9–10.3)
Chloride: 103 mmol/L (ref 98–111)
Creatinine, Ser: 1.27 mg/dL — ABNORMAL HIGH (ref 0.44–1.00)
GFR, Estimated: 50 mL/min — ABNORMAL LOW (ref >60)
Glucose, Bld: 118 mg/dL — ABNORMAL HIGH (ref 70–99)
Potassium: 4.2 mmol/L (ref 3.5–5.1)
Sodium: 137 mmol/L (ref 135–145)

## 2024-02-02 LAB — CBC WITH DIFFERENTIAL/PLATELET
Abs Immature Granulocytes: 0.04 10*3/uL (ref 0.00–0.07)
Basophils Absolute: 0 10*3/uL (ref 0.0–0.1)
Basophils Relative: 0 %
Eosinophils Absolute: 0.1 10*3/uL (ref 0.0–0.5)
Eosinophils Relative: 2 %
HCT: 35 % — ABNORMAL LOW (ref 36.0–46.0)
Hemoglobin: 10.9 g/dL — ABNORMAL LOW (ref 12.0–15.0)
Immature Granulocytes: 1 %
Lymphocytes Relative: 26 %
Lymphs Abs: 2.1 10*3/uL (ref 0.7–4.0)
MCH: 30.1 pg (ref 26.0–34.0)
MCHC: 31.1 g/dL (ref 30.0–36.0)
MCV: 96.7 fL (ref 80.0–100.0)
Monocytes Absolute: 0.5 10*3/uL (ref 0.1–1.0)
Monocytes Relative: 6 %
Neutro Abs: 5.4 10*3/uL (ref 1.7–7.7)
Neutrophils Relative %: 65 %
Platelets: 281 10*3/uL (ref 150–400)
RBC: 3.62 MIL/uL — ABNORMAL LOW (ref 3.87–5.11)
RDW: 14 % (ref 11.5–15.5)
WBC: 8.3 10*3/uL (ref 4.0–10.5)
nRBC: 0 % (ref 0.0–0.2)

## 2024-02-02 LAB — TROPONIN I (HIGH SENSITIVITY): Troponin I (High Sensitivity): 43 ng/L — ABNORMAL HIGH (ref <18)

## 2024-02-02 LAB — POC COVID19/FLU A&B COMBO
Covid Antigen, POC: NEGATIVE
Influenza A Antigen, POC: NEGATIVE
Influenza B Antigen, POC: NEGATIVE

## 2024-02-02 LAB — HEPATIC FUNCTION PANEL
ALT: 18 U/L (ref 0–44)
AST: 18 U/L (ref 15–41)
Albumin: 3.4 g/dL — ABNORMAL LOW (ref 3.5–5.0)
Alkaline Phosphatase: 79 U/L (ref 38–126)
Bilirubin, Direct: 0.1 mg/dL (ref 0.0–0.2)
Indirect Bilirubin: 0.5 mg/dL (ref 0.3–0.9)
Total Bilirubin: 0.6 mg/dL (ref 0.0–1.2)
Total Protein: 7.7 g/dL (ref 6.5–8.1)

## 2024-02-02 LAB — BRAIN NATRIURETIC PEPTIDE: B Natriuretic Peptide: 273.5 pg/mL — ABNORMAL HIGH (ref 0.0–100.0)

## 2024-02-02 LAB — MAGNESIUM: Magnesium: 2 mg/dL (ref 1.7–2.4)

## 2024-02-02 LAB — I-STAT CG4 LACTIC ACID, ED: Lactic Acid, Venous: 1.3 mmol/L (ref 0.5–1.9)

## 2024-02-02 MED ORDER — ALBUTEROL SULFATE (2.5 MG/3ML) 0.083% IN NEBU
INHALATION_SOLUTION | RESPIRATORY_TRACT | Status: AC
  Filled 2024-02-02: qty 3

## 2024-02-02 MED ORDER — FUROSEMIDE 10 MG/ML IJ SOLN
40.0000 mg | Freq: Once | INTRAMUSCULAR | Status: AC
  Administered 2024-02-02: 40 mg via INTRAVENOUS
  Filled 2024-02-02: qty 4

## 2024-02-02 MED ORDER — IPRATROPIUM-ALBUTEROL 0.5-2.5 (3) MG/3ML IN SOLN
3.0000 mL | Freq: Once | RESPIRATORY_TRACT | Status: AC
Start: 2024-02-02 — End: 2024-02-02
  Administered 2024-02-02: 3 mL via RESPIRATORY_TRACT

## 2024-02-02 MED ORDER — ALBUTEROL SULFATE (2.5 MG/3ML) 0.083% IN NEBU
INHALATION_SOLUTION | RESPIRATORY_TRACT | Status: AC
  Administered 2024-02-02: 2.5 mg
  Filled 2024-02-02: qty 3

## 2024-02-02 MED ORDER — ACETAMINOPHEN 325 MG PO TABS
650.0000 mg | ORAL_TABLET | Freq: Once | ORAL | Status: AC
  Administered 2024-02-02: 650 mg via ORAL
  Filled 2024-02-02: qty 2

## 2024-02-02 MED ORDER — ALBUTEROL SULFATE (2.5 MG/3ML) 0.083% IN NEBU: 2.5000 mg | INHALATION_SOLUTION | Freq: Once | RESPIRATORY_TRACT | Status: DC

## 2024-02-02 MED ORDER — SODIUM CHLORIDE 0.9 % IV SOLN
500.0000 mg | Freq: Once | INTRAVENOUS | Status: AC
  Administered 2024-02-03: 500 mg via INTRAVENOUS
  Filled 2024-02-02: qty 5

## 2024-02-02 MED ORDER — ONDANSETRON HCL 4 MG/2ML IJ SOLN: 4.0000 mg | Freq: Once | INTRAMUSCULAR | Status: DC

## 2024-02-02 MED ORDER — SODIUM CHLORIDE 0.9 % IV SOLN
1.0000 g | Freq: Once | INTRAVENOUS | Status: AC
  Administered 2024-02-02: 1 g via INTRAVENOUS
  Filled 2024-02-02: qty 10

## 2024-02-02 NOTE — Progress Notes (Signed)
 Pt taken off BIPAP and placed on HHFNC due to aspiration.

## 2024-02-02 NOTE — Discharge Instructions (Signed)
 You were seen today for cough, shortness of breath.  Your flu and covid test was negative.  There is a possible pneumonia on your xray.  Due to your oxygen requirement and shortness of breath, you need to go to the ER for further evaluation.

## 2024-02-02 NOTE — ED Notes (Signed)
 Patient is being discharged from the Urgent Care and sent to the Emergency Department via [facility] . Per Jannifer Franklin, MD, patient is in need of higher level of care due to hypoxia and tachypnea. Patient is aware and verbalizes understanding of plan of care.  Vitals:   02/02/24 1255 02/02/24 1301  BP:  (!) 156/107  Pulse:  77  Resp:  (!) 28  Temp:  98.2 F (36.8 C)  SpO2: 92% 93%

## 2024-02-02 NOTE — ED Provider Notes (Signed)
 [place] EMERGENCY DEPARTMENT AT [facility] Provider Note   CSN: [id number] Arrival date & time: 02/02/24  1416     History  Chief Complaint  Patient presents with   Shortness of Breath    Brandi Gomez is a 53 y.o. female.   Shortness of Breath Associated symptoms: cough and headaches   Patient presents for cough and shortness of breath.  Medical history includes migraine headaches, epilepsy, anemia, bipolar disorder, depression, DM, HTN, IBS, CVA, HLD, CKD, CHF.  For the past 4 days, she has had cough, congestion, shortness of breath.  She has been using over-the-counter cough and cold remedies.  She was seen urgent care earlier today.  While there, she underwent x-ray which showed cardiac enlargement and pulmonary edema.  POC COVID and flu testing were negative.  Per chart review, last echocardiogram was in 2020.  She had LVEF of 35 to 40% at the time.  She states that she does take Lasix daily.  She is unsure of the dose.  She feels that her current leg swelling is baseline.  She does endorse exercise intolerance, orthopnea.  She states that she has a current mild headache but denies any other areas of discomfort.  At baseline, she will wear supplemental oxygen at night.  She typically does not use it during the day.     Home Medications Prior to Admission medications   Medication Sig Start Date End Date Taking? Authorizing Provider  acetaminophen (TYLENOL) 325 MG tablet Take 2 tablets (650 mg total) by mouth every 4 (four) hours as needed for mild pain (or temp > 37.5 C (99.5 F)). 10/19/19  Yes Angiulli, Mcarthur Rossetti, PA-C  ARIPiprazole (ABILIFY) 10 MG tablet Take 0.5 tablets (5 mg total) by mouth daily. 09/30/23  Yes Plovsky, Earvin Hansen, MD  atorvastatin (LIPITOR) 40 MG tablet TAKE 1 TABLET (40 MG TOTAL) BY MOUTH DAILY AT 6 PM FOR CHOLESTEROL 09/24/21  Yes Sagardia, Eilleen Kempf, MD  carvedilol (COREG) 6.25 MG tablet Take 6.25 mg by mouth 2 (two) times daily. 05/20/21  Yes  [provider]  chlorhexidine (PERIDEX) 0.12 % solution Use as directed 15 mLs in the mouth or throat daily.   Yes [provider]  cloBAZam (ONFI) 10 MG tablet Take 10 mg by mouth at bedtime.   Yes [provider]  ferrous sulfate 325 (65 FE) MG tablet Take 325 mg by mouth daily with breakfast. 07/10/22  Yes [provider]  glipiZIDE (GLUCOTROL) 10 MG tablet Take 10 mg by mouth 2 (two) times daily. 04/27/22  Yes [provider]  ibuprofen (ADVIL) 200 MG tablet Take 400 mg by mouth every 6 (six) hours as needed for mild pain (pain score 1-3) or headache.   Yes [provider]  lamoTRIgine (LAMICTAL) 100 MG tablet TAKE 3 TABLETS (300 MG TOTAL) BY MOUTH 2 (TWO) TIMES DAILY. Patient taking differently: Take 200 mg by mouth 2 (two) times daily. 12/09/23  Yes Camara, Amalia Hailey, MD  sitaGLIPtin (JANUVIA) 100 MG tablet Take 100 mg by mouth daily.   Yes [provider]  torsemide (DEMADEX) 10 MG tablet Take 10 mg by mouth daily.   Yes [provider]  Vitamin D, Ergocalciferol, (DRISDOL) 1.25 MG (50000 UNIT) CAPS capsule Take 1 capsule (50,000 Units total) by mouth every 7 (seven) days. 07/03/23  Yes Glean Salvo, NP  hydrOXYzine (VISTARIL) 25 MG capsule 1 QHS  1 qday PRN 02/12/23   Archer Asa, MD  methylphenidate (RITALIN) 5 MG tablet  Take 1 tablet (5 mg total) by mouth 2 (two) times daily. Patient not taking: Reported on 12/29/2019 10/29/19 01/01/20  Horton Chin, MD      Allergies    Amoxicillin, Lisinopril, Hydrocodone, and Tegretol [carbamazepine]    Review of Systems   Review of Systems  Respiratory:  Positive for cough and shortness of breath.   Neurological:  Positive for headaches.  All other systems reviewed and are negative.   Physical Exam Updated Vital Signs BP (!) 134/102   Pulse 75   Temp 98.5 F (36.9 C) (Oral)   Resp 14   LMP  (LMP Unknown)   SpO2 99%  Physical Exam Vitals and nursing note  reviewed.  Constitutional:      General: She is not in acute distress.    Appearance: She is well-developed. She is not ill-appearing, toxic-appearing or diaphoretic.  HENT:     Head: Normocephalic and atraumatic.     Mouth/Throat:     Mouth: Mucous membranes are moist.  Eyes:     Conjunctiva/sclera: Conjunctivae normal.  Cardiovascular:     Rate and Rhythm: Normal rate and regular rhythm.     Heart sounds: No murmur heard. Pulmonary:     Effort: Pulmonary effort is normal. No tachypnea or respiratory distress.     Breath sounds: Decreased breath sounds and rales present.  Abdominal:     Palpations: Abdomen is soft.     Tenderness: There is no abdominal tenderness.  Musculoskeletal:        General: No swelling.     Cervical back: Normal range of motion and neck supple.     Right lower leg: Edema present.     Left lower leg: Edema present.  Skin:    General: Skin is warm and dry.  Neurological:     General: No focal deficit present.     Mental Status: She is alert and oriented to person, place, and time.  Psychiatric:        Mood and Affect: Mood normal.        Behavior: Behavior normal.     ED Results / Procedures / Treatments   Labs (all labs ordered are listed, but only abnormal results are displayed) Labs Reviewed  CBC WITH DIFFERENTIAL/PLATELET - Abnormal; Notable for the following components:      Result Value   RBC 3.62 (*)    Hemoglobin 10.9 (*)    HCT 35.0 (*)    All other components within normal limits  BASIC METABOLIC PANEL - Abnormal; Notable for the following components:   Glucose, Bld 118 (*)    Creatinine, Ser 1.27 (*)    Calcium 8.6 (*)    GFR, Estimated 50 (*)    All other components within normal limits  BRAIN NATRIURETIC PEPTIDE - Abnormal; Notable for the following components:   B Natriuretic Peptide 273.5 (*)    All other components within normal limits  HEPATIC FUNCTION PANEL - Abnormal; Notable for the following components:   Albumin 3.4  (*)    All other components within normal limits  TROPONIN I (HIGH SENSITIVITY) - Abnormal; Notable for the following components:   Troponin I (High Sensitivity) 43 (*)    All other components within normal limits  CULTURE, BLOOD (ROUTINE X 2)  CULTURE, BLOOD (ROUTINE X 2)  MAGNESIUM  I-STAT CG4 LACTIC ACID, ED  I-STAT CG4 LACTIC ACID, ED  TROPONIN I (HIGH SENSITIVITY)    EKG None  Radiology CT Chest Wo Contrast Result Date: 02/02/2024  CLINICAL DATA:  Cough, shortness of breath, pneumonia EXAM: CT CHEST WITHOUT CONTRAST TECHNIQUE: Multidetector CT imaging of the chest was performed following the standard protocol without IV contrast. RADIATION DOSE REDUCTION: This exam was performed according to the departmental dose-optimization program which includes automated exposure control, adjustment of the mA and/or kV according to patient size and/or use of iterative reconstruction technique. COMPARISON:  Chest radiograph dated 02/02/2024 FINDINGS: Severely motion degraded imaging. Cardiovascular: Mild cardiomegaly.  No pericardial effusion. No evidence of thoracic aortic aneurysm. Mild atherosclerotic calcifications of the aortic arch. Mediastinum/Nodes: No suspicious mediastinal lymphadenopathy. Lungs/Pleura: Severe respiratory motion. Multifocal patchy/ground-glass opacities, right lung predominant. While poorly evaluated, this favors interstitial edema over multifocal pneumonia. Small right and trace left pleural effusions. No pneumothorax. Upper Abdomen: Visualized upper abdomen is grossly unremarkable. Musculoskeletal: Visualized osseous structures are within normal limits. IMPRESSION: Severely motion degraded imaging. Cardiomegaly with suspected interstitial edema, right lung predominant. Small bilateral pleural effusions. Aortic Atherosclerosis (ICD10-I70.0). Electronically Signed   By: Charline Bills M.D.   On: 02/02/2024 23:08   DG Chest 2 View Result Date: 02/02/2024 CLINICAL DATA:  Cough,  hypoxia. EXAM: CHEST - 2 VIEW COMPARISON:  Radiographs 08/27/2021 and 10/31/2020.  CT 04/02/2005. FINDINGS: Progressive cardiac enlargement with poor definition of the pulmonary vasculature and increased diffuse interstitial prominence, suspicious for pulmonary edema. No confluent airspace disease, significant pleural effusion or pneumothorax. The bones appear unchanged. IMPRESSION: Progressive cardiac enlargement with suspected pulmonary edema, suspicious for congestive heart. No confluent airspace disease or pleural effusion. Electronically Signed   By: Carey Bullocks M.D.   On: 02/02/2024 15:07    Procedures Procedures    Medications Ordered in ED Medications  ondansetron (ZOFRAN) injection 4 mg (4 mg Intravenous Not Given 02/02/24 2243)  albuterol (PROVENTIL) (2.5 MG/3ML) 0.083% nebulizer solution (has no administration in time range)  cefTRIAXone (ROCEPHIN) 1 g in sodium chloride 0.9 % 100 mL IVPB (1 g Intravenous New Bag/Given 02/02/24 2257)  azithromycin (ZITHROMAX) 500 mg in sodium chloride 0.9 % 250 mL IVPB (has no administration in time range)  acetaminophen (TYLENOL) tablet 650 mg (650 mg Oral Given 02/02/24 1738)  ipratropium-albuterol (DUONEB) 0.5-2.5 (3) MG/3ML nebulizer solution 3 mL (3 mLs Nebulization Given 02/02/24 1842)  albuterol (PROVENTIL) (2.5 MG/3ML) 0.083% nebulizer solution (2.5 mg  Given 02/02/24 1932)  furosemide (LASIX) injection 40 mg (40 mg Intravenous Given 02/02/24 2000)    ED Course/ Medical Decision Making/ A&P                                 Medical Decision Making Amount and/or Complexity of Data Reviewed Labs: ordered. Radiology: ordered.  Risk OTC drugs. Prescription drug management. Decision regarding hospitalization.   This patient presents to the ED for concern of cough and shortness of breath, this involves an extensive number of treatment options, and is a complaint that carries with it a high risk of complications and morbidity.  The  differential diagnosis includes pneumonia, reactive airway disease, CHF, URI   Co morbidities that complicate the patient evaluation  migraine headaches, epilepsy, anemia, bipolar disorder, depression, DM, HTN, IBS, CVA, HLD, CKD, CHF   Additional history obtained:  Additional history obtained from N/A External records from outside source obtained and reviewed including EMR   Lab Tests:  I Ordered, and personally interpreted labs.  The pertinent results include: BNP and troponin mildly elevated consistent with CHF.  Anemia is baseline.  No leukocytosis is present.  Imaging Studies ordered:  I ordered imaging studies including CT chest I independently visualized and interpreted imaging which showed cardiomegaly with interstitial edema, greater on the right side. I agree with the radiologist interpretation   Cardiac Monitoring: / EKG:  The patient was maintained on a cardiac monitor.  I personally viewed and interpreted the cardiac monitored which showed an underlying rhythm of: Sinus rhythm   Problem List / ED Course / Critical interventions / Medication management  Patient presents for cough and shortness of breath.  On arrival in the ED, vital signs are notable for hypertension.  SpO2 is normal on 3 L of supplemental oxygen.  Current breathing is unlabored.  She is able to speak in complete sentences.  She has diminished breath sounds on lung auscultation.  No wheezing is appreciated.  Workup was initiated.  While in the ED, she was given a DuoNeb.  This caused a coughing spell and subsequent desaturation.  She was placed on nonrebreather.  At this point, she had worsening tachypnea and increased work of breathing.  BiPAP was ordered.  While on BiPAP, patient had episode of emesis.  She was given Zofran.  She was placed on heated high flow to minimize risk of aspiration.  BNP and troponin are mildly elevated consistent with CHF exacerbation.  Dose of IV Lasix was ordered.  On CT  scan, there does appear to be asymmetric opacities, greater on the right side.  She was started on antibiotics for empiric treatment of pneumonia.  On reassessment, patient tolerating heated high flow well.  She was admitted for further management. I ordered medication including Tylenol for headache; Lasix for diuresis; ceftriaxone and azithromycin for pneumonia Reevaluation of the patient after these medicines showed that the patient improved I have reviewed the patients home medicines and have made adjustments as needed   Social Determinants of Health:  Lives independently  CRITICAL CARE Performed by: Gloris Manchester   Total critical care time: 32 minutes  Critical care time was exclusive of separately billable procedures and treating other patients.  Critical care was necessary to treat or prevent imminent or life-threatening deterioration.  Critical care was time spent personally by me on the following activities: development of treatment plan with patient and/or surrogate as well as nursing, discussions with consultants, evaluation of patient's response to treatment, examination of patient, obtaining history from patient or surrogate, ordering and performing treatments and interventions, ordering and review of laboratory studies, ordering and review of radiographic studies, pulse oximetry and re-evaluation of patient's condition.         Final Clinical Impression(s) / ED Diagnoses Final diagnoses:  Acute on chronic congestive heart failure, unspecified heart failure type (HCC)  Acute on chronic respiratory failure with hypoxia (HCC)  Pneumonia of right upper lobe due to infectious organism    Rx / DC Orders ED Discharge Orders     None         Gloris Manchester, MD 02/02/24 2356

## 2024-02-02 NOTE — ED Provider Notes (Signed)
 [facility]    CSN: [id number] Arrival date & time: 02/02/24  1121      History   Chief Complaint Chief Complaint  Patient presents with   Shortness of Breath   Cough    HPI Brandi Gomez is a 53 y.o. female.    Shortness of Breath Associated symptoms: cough   Cough Associated symptoms: rhinorrhea and shortness of breath    Patient is here for 4 days of URI symptoms.  Having a cough, chest congestion, sob, headache.  Feels hot/cold but not sure if a fever or not.  Feels wheezing.   No h/o asthma or copd.  No known sick contactrs.  She has used otc mucinex, cough drops without help. She was hospitalized several years ago for covid 19 infection.        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, unspecified cerebrovascular disease type (HCC) 01/18/2021   COVID-19 10/31/2020   Morbid (severe) obesity due to excess calories (HCC) 04/18/2020   HLD (hyperlipidemia) 10/12/2019   CKD (chronic kidney disease) stage 3, GFR 30-59 ml/min (HCC) 10/12/2019   Chronic combined systolic and diastolic heart failure (HCC)    Depression 07/14/2019   Bipolar disorder (HCC) 05/13/2019   Bipolar 1 disorder (HCC) 05/13/2019   Adjustment disorder with anxiety    History of recent stroke 08/06/2017   OSA (obstructive sleep apnea)    Controlled  type 2 diabetes mellitus without complication, without long-term current use of insulin (HCC) 05/08/2017   Hypertension associated with diabetes (HCC) 07/27/2008   Seizure disorder (HCC) 02/05/2007    Past Surgical History:  Procedure Laterality Date   BUBBLE STUDY  11/15/2019   Procedure: BUBBLE STUDY;  Surgeon: Lars Masson, MD;  Location: [facility] ENDOSCOPY;  Service: Cardiovascular;;   COLONOSCOPY     2012-normal , Dr Jarold Motto   ESOPHAGOGASTRODUODENOSCOPY     normal-Dr Jarold Motto 2012   LOOP RECORDER INSERTION N/A 05/30/2017   Procedure: Loop Recorder Insertion;  Surgeon: Regan Lemming, MD;  Location: [facility] INVASIVE CV LAB;  Service: Cardiovascular;  Laterality: N/A;   LOOP RECORDER REMOVAL N/A 03/04/2018   Procedure: LOOP RECORDER REMOVAL;  Surgeon: Regan Lemming, MD;  Location: [facility] INVAS[facility] LAB;  Service: Cardiovascular;  Laterality: N/A;   MYRINGOTOMY WITH TUBE PLACEMENT     MYRINGOTOMY WITH TUBE PLACEMENT Right 11/05/2017   Procedure: MYRINGOTOMY WITH TUBE PLACEMENT;  Surgeon: Suzanna Obey, MD;  Location: [facility] OR;  Service: ENT;  Laterality: Right;  right T Tube placement   NASAL SINUS SURGERY     TEE WITHOUT CARDIOVERSION N/A 05/30/2017   Procedure: TRANSESOPHAGEAL ECHOCARDIOGRAM (TEE);  Surgeon: Elease Hashimoto Deloris Ping, MD;  Location: [facility] ENDOSCOPY;  Service: Cardiovascular;  Laterality: N/A;   TEE WITHOUT CARDIOVERSION N/A 11/15/2019  Procedure: TRANSESOPHAGEAL ECHOCARDIOGRAM (TEE);  Surgeon: Lars Masson, MD;  Location: [facility] ENDOSCOPY;  Service: Cardiovascular;  Laterality: N/A;    OB History   No obstetric history on file.      Home Medications    Prior to Admission medications   Medication Sig Start Date End Date Taking? Authorizing Provider  ARIPiprazole (ABILIFY) 10 MG tablet Take 0.5 tablets (5 mg total) by mouth daily. 09/30/23  Yes Plovsky, Earvin Hansen, MD  atorvastatin (LIPITOR) 40 MG tablet TAKE 1 TABLET (40 MG TOTAL) BY MOUTH DAILY AT 6 PM FOR CHOLESTEROL  09/24/21  Yes Sagardia, Eilleen Kempf, MD  carvedilol (COREG) 6.25 MG tablet Take 6.25 mg by mouth 2 (two) times daily. 05/20/21  Yes [provider]  clonazePAM (KLONOPIN) 0.5 MG tablet Take 1 tablet (0.5 mg total) by mouth 2 (two) times daily as needed for up to 1 dose for anxiety. 08/10/23  Yes Ali, Amjad, PA-C  ferrous sulfate 325 (65 FE) MG tablet Take 325 mg by mouth 2 (two) times daily. 07/10/22  Yes [provider]  folic acid (FOLVITE) 1 MG tablet Take 1 mg by mouth daily. 03/04/22  Yes [provider]  glipiZIDE (GLUCOTROL) 10 MG tablet Take 10 mg by mouth 2 (two) times daily. 04/27/22  Yes [provider]  hydrALAZINE (APRESOLINE) 10 MG tablet Take 1 tablet (10 mg total) by mouth 2 (two) times daily. 01/04/20 07/19/24 Yes Sagardia, Eilleen Kempf, MD  isosorbide dinitrate (ISORDIL) 10 MG tablet Take 1 tablet (10 mg total) by mouth 2 (two) times daily. 01/04/20 07/27/24 Yes Sagardia, Eilleen Kempf, MD  lamoTRIgine (LAMICTAL) 100 MG tablet TAKE 3 TABLETS (300 MG TOTAL) BY MOUTH 2 (TWO) TIMES DAILY. 12/09/23  Yes Windell Norfolk, MD  Vitamin D, Ergocalciferol, (DRISDOL) 1.25 MG (50000 UNIT) CAPS capsule Take 1 capsule (50,000 Units total) by mouth every 7 (seven) days. 07/03/23  Yes Glean Salvo, NP  ACCU-CHEK GUIDE test strip  04/27/22   [provider]  acetaminophen (TYLENOL) 325 MG tablet Take 2 tablets (650 mg total) by mouth every 4 (four) hours as needed for mild pain (or temp > 37.5 C (99.5 F)). 10/19/19   Angiulli, Mcarthur Rossetti, PA-C  hydrOXYzine (VISTARIL) 25 MG capsule 1 QHS  1 qday PRN 02/12/23   Archer Asa, MD  methylphenidate (RITALIN) 5 MG tablet Take 1 tablet (5 mg total) by mouth 2 (two) times daily. Patient not taking: Reported on 12/29/2019 10/29/19 01/01/20  Horton Chin, MD    Family History Family History  Problem Relation Age of Onset   Diabetes Mother        passed away from accidental death   Hypertension Mother    Bipolar disorder  Father    Diabetes Daughter    Leukemia Daughter    Ovarian cancer Maternal Aunt     Social History Social History   Tobacco Use   Smoking status: Never   Smokeless tobacco: Never  Vaping Use   Vaping status: Never Used  Substance Use Topics   Alcohol use: No   Drug use: No     Allergies   Amoxicillin, Lisinopril, Hydrocodone, and Tegretol [carbamazepine]   Review of Systems Review of Systems  Constitutional:  Positive for fatigue.  HENT:  Positive for congestion and rhinorrhea.   Respiratory:  Positive for cough and shortness of breath.   Gastrointestinal: Negative.   Genitourinary: Negative.   Musculoskeletal: Negative.   Psychiatric/Behavioral: Negative.       Physical Exam Triage Vital  Signs ED Triage Vitals  Encounter Vitals Group     BP 02/02/24 1301 (!) 156/107     Systolic BP Percentile --      Diastolic BP Percentile --      Pulse Rate 02/02/24 1133 (!) 107     Resp 02/02/24 1301 (!) 28     Temp 02/02/24 1301 98.2 F (36.8 C)     Temp Source 02/02/24 1301 Oral     SpO2 02/02/24 1133 97 %     Weight --      Height --      Head Circumference --      Peak Flow --      Pain Score 02/02/24 1259 8     Pain Loc --      Pain Education --      Exclude from Growth Chart --    No data found.  Updated Vital Signs BP (!) 156/107 (BP Location: Right Arm)   Pulse 77   Temp 98.2 F (36.8 C) (Oral)   Resp (!) 28   LMP  (LMP Unknown)   SpO2 93%   Visual Acuity Right Eye Distance:   Left Eye Distance:   Bilateral Distance:    Right Eye Near:   Left Eye Near:    Bilateral Near:     Physical Exam Constitutional:      General: She is not in acute distress.    Appearance: She is well-developed and normal weight. She is not ill-appearing or toxic-appearing.  Cardiovascular:     Rate and Rhythm: Normal rate and regular rhythm.  Pulmonary:     Effort: Pulmonary effort is normal.     Breath sounds: Normal breath sounds.  Musculoskeletal:      Cervical back: Normal range of motion and neck supple.  Skin:    General: Skin is warm.  Neurological:     General: No focal deficit present.     Mental Status: She is alert.  Psychiatric:        Mood and Affect: Mood normal.      UC Treatments / Results  Labs (all labs ordered are listed, but only abnormal results are displayed) Labs Reviewed  POC COVID19/FLU A&B COMBO - Normal    EKG   Radiology No results found.  Procedures Procedures (including critical care time)  Medications Ordered in UC Medications - No data to display  Initial Impression / Assessment and Plan / UC Course  I have reviewed the triage vital signs and the nursing notes.  Pertinent labs & imaging results that were available during my care of the patient were reviewed by me and considered in my medical decision making (see chart for details).  Final Clinical Impressions(s) / UC Diagnoses   Final diagnoses:  Acute cough  Hypoxia  Pneumonia of right lower lobe due to infectious organism     Discharge Instructions      You were seen today for cough, shortness of breath.  Your flu and covid test was negative.  There is a possible pneumonia on your xray.  Due to your oxygen requirement and shortness of breath, you need to go to the ER for further evaluation.      ED Prescriptions   None    PDMP not reviewed this encounter.   Jannifer Franklin, MD 02/02/24 1340

## 2024-02-02 NOTE — ED Notes (Signed)
 1st lactic 1.3 in normal range 2nd not needed

## 2024-02-02 NOTE — ED Triage Notes (Signed)
 Pt bib from UC; c/o chest congestion/pressure, sob, cough, fevers; flu and covid neg ; sats 80s init; 2L now 97%; desats with exertion; pneu RLL; 149/96, hr 83, 97% 2L, RR 28

## 2024-02-02 NOTE — ED Provider Triage Note (Signed)
 Emergency Medicine Provider Triage Evaluation Note  Brandi Gomez , a 53 y.o. female  was evaluated in triage.  Pt complains of cough and shortness of breath.  Went to urgent care and diagnosed with pneumonia and sent here.  On home oxygen chronically.  Review of Systems  Positive: Cough and weakness Negative: No anginal symptoms  Physical Exam  BP (!) 164/105 (BP Location: Left Wrist)   Pulse 84   Temp 98.9 F (37.2 C) (Oral)   Resp 18   LMP  (LMP Unknown)   SpO2 96%  Gen:   Uncomfortable appearing Resp:  Tachypneic MSK:   Edema in extremities Other:    Medical Decision Making  Medically screening exam initiated at 2:48 PM.  Appropriate orders placed.  Brandi Gomez was informed that the remainder of the evaluation will be completed by another provider, this initial triage assessment does not replace that evaluation, and the importance of remaining in the ED until their evaluation is complete.     Lorre Nick, MD 02/02/24 [id number]

## 2024-02-02 NOTE — ED Notes (Signed)
 [facility] and the ED charge nurse have both been made aware of the patient transport.

## 2024-02-02 NOTE — ED Triage Notes (Addendum)
 [name] chest congestion, chest pressure, shortness of breath, headache, and a cough.   Start date: 01/30/2024  Home Interventions: Mucinex, Cough drops

## 2024-02-02 NOTE — ED Notes (Signed)
 Report given to [facility].

## 2024-02-03 ENCOUNTER — Ambulatory Visit (HOSPITAL_COMMUNITY): Payer: Medicare HMO | Admitting: Psychiatry

## 2024-02-03 ENCOUNTER — Inpatient Hospital Stay (HOSPITAL_COMMUNITY): Payer: Medicare HMO

## 2024-02-03 ENCOUNTER — Encounter (HOSPITAL_COMMUNITY): Payer: Self-pay | Admitting: Family Medicine

## 2024-02-03 DIAGNOSIS — R7989 Other specified abnormal findings of blood chemistry: Secondary | ICD-10-CM | POA: Diagnosis present

## 2024-02-03 DIAGNOSIS — I5023 Acute on chronic systolic (congestive) heart failure: Secondary | ICD-10-CM

## 2024-02-03 LAB — BLOOD CULTURE ID PANEL (REFLEXED) - BCID2

## 2024-02-03 LAB — BASIC METABOLIC PANEL
Anion gap: 7 (ref 5–15)
BUN: 12 mg/dL (ref 6–20)
CO2: 27 mmol/L (ref 22–32)
Calcium: 8.5 mg/dL — ABNORMAL LOW (ref 8.9–10.3)
Chloride: 103 mmol/L (ref 98–111)
Creatinine, Ser: 1.32 mg/dL — ABNORMAL HIGH (ref 0.44–1.00)
GFR, Estimated: 48 mL/min — ABNORMAL LOW (ref >60)
Glucose, Bld: 156 mg/dL — ABNORMAL HIGH (ref 70–99)
Potassium: 3.7 mmol/L (ref 3.5–5.1)
Sodium: 137 mmol/L (ref 135–145)

## 2024-02-03 LAB — ECHOCARDIOGRAM COMPLETE
AR max vel: 1.98 cm2
AV Area VTI: 2.15 cm2
AV Area mean vel: 1.81 cm2
AV Mean grad: 5 mmHg
AV Peak grad: 9.1 mmHg
Ao pk vel: 1.51 m/s
Area-P 1/2: 3.72 cm2
S' Lateral: 4.7 cm

## 2024-02-03 LAB — CBG MONITORING, ED
Glucose-Capillary: 119 mg/dL — ABNORMAL HIGH (ref 70–99)
Glucose-Capillary: 141 mg/dL — ABNORMAL HIGH (ref 70–99)

## 2024-02-03 LAB — CBC
HCT: 35.6 % — ABNORMAL LOW (ref 36.0–46.0)
Hemoglobin: 11.1 g/dL — ABNORMAL LOW (ref 12.0–15.0)
MCH: 29.8 pg (ref 26.0–34.0)
MCHC: 31.2 g/dL (ref 30.0–36.0)
MCV: 95.4 fL (ref 80.0–100.0)
Platelets: 248 10*3/uL (ref 150–400)
RBC: 3.73 MIL/uL — ABNORMAL LOW (ref 3.87–5.11)
RDW: 14 % (ref 11.5–15.5)
WBC: 10.8 10*3/uL — ABNORMAL HIGH (ref 4.0–10.5)
nRBC: 0 % (ref 0.0–0.2)

## 2024-02-03 LAB — FOLATE: Folate: 7.5 ng/mL (ref >5.9)

## 2024-02-03 LAB — GLUCOSE, CAPILLARY
Glucose-Capillary: 114 mg/dL — ABNORMAL HIGH (ref 70–99)
Glucose-Capillary: 136 mg/dL — ABNORMAL HIGH (ref 70–99)

## 2024-02-03 LAB — VITAMIN B12: Vitamin B-12: 166 pg/mL — ABNORMAL LOW (ref 180–914)

## 2024-02-03 LAB — MAGNESIUM: Magnesium: 1.9 mg/dL (ref 1.7–2.4)

## 2024-02-03 LAB — IRON AND TIBC
Iron: 32 ug/dL (ref 28–170)
Saturation Ratios: 10 % — ABNORMAL LOW (ref 10.4–31.8)
TIBC: 311 ug/dL (ref 250–450)
UIBC: 279 ug/dL

## 2024-02-03 LAB — PROCALCITONIN: Procalcitonin: 0.45 ng/mL

## 2024-02-03 LAB — PHOSPHORUS: Phosphorus: 4.8 mg/dL — ABNORMAL HIGH (ref 2.5–4.6)

## 2024-02-03 LAB — VITAMIN D 25 HYDROXY (VIT D DEFICIENCY, FRACTURES): Vit D, 25-Hydroxy: 43.53 ng/mL (ref 30–100)

## 2024-02-03 LAB — HIV ANTIBODY (ROUTINE TESTING W REFLEX): HIV Screen 4th Generation wRfx: NONREACTIVE

## 2024-02-03 LAB — TROPONIN I (HIGH SENSITIVITY): Troponin I (High Sensitivity): 48 ng/L — ABNORMAL HIGH (ref <18)

## 2024-02-03 MED ORDER — POLYETHYLENE GLYCOL 3350 17 G PO PACK
17.0000 g | PACK | Freq: Every day | ORAL | Status: DC | PRN
  Administered 2024-02-05: 17 g via ORAL
  Filled 2024-02-03: qty 1

## 2024-02-03 MED ORDER — SODIUM CHLORIDE 0.9% FLUSH
3.0000 mL | Freq: Two times a day (BID) | INTRAVENOUS | Status: DC
  Administered 2024-02-03 – 2024-02-08 (×12): 3 mL via INTRAVENOUS

## 2024-02-03 MED ORDER — HYDROXYZINE HCL 25 MG PO TABS: 25.0000 mg | ORAL_TABLET | Freq: Every evening | ORAL | Status: DC | PRN

## 2024-02-03 MED ORDER — OXYCODONE HCL 5 MG PO TABS: 5.0000 mg | ORAL_TABLET | ORAL | Status: DC | PRN

## 2024-02-03 MED ORDER — INSULIN ASPART 100 UNIT/ML IJ SOLN
0.0000 [IU] | Freq: Three times a day (TID) | INTRAMUSCULAR | Status: DC
  Administered 2024-02-04 – 2024-02-06 (×5): 1 [IU] via SUBCUTANEOUS

## 2024-02-03 MED ORDER — ENOXAPARIN SODIUM 40 MG/0.4ML IJ SOSY
40.0000 mg | PREFILLED_SYRINGE | Freq: Every day | INTRAMUSCULAR | Status: DC
  Administered 2024-02-03 – 2024-02-08 (×6): 40 mg via SUBCUTANEOUS
  Filled 2024-02-03 (×6): qty 0.4

## 2024-02-03 MED ORDER — CARVEDILOL 6.25 MG PO TABS
6.2500 mg | ORAL_TABLET | Freq: Two times a day (BID) | ORAL | Status: DC
  Administered 2024-02-03 – 2024-02-08 (×10): 6.25 mg via ORAL
  Filled 2024-02-03 (×5): qty 1
  Filled 2024-02-03: qty 2
  Filled 2024-02-03 (×4): qty 1

## 2024-02-03 MED ORDER — CLOBAZAM 10 MG PO TABS
10.0000 mg | ORAL_TABLET | Freq: Every day | ORAL | Status: DC
  Administered 2024-02-03 – 2024-02-07 (×6): 10 mg via ORAL
  Filled 2024-02-03 (×6): qty 1

## 2024-02-03 MED ORDER — ARIPIPRAZOLE 5 MG PO TABS
5.0000 mg | ORAL_TABLET | Freq: Every day | ORAL | Status: DC
  Administered 2024-02-03 – 2024-02-08 (×6): 5 mg via ORAL
  Filled 2024-02-03 (×6): qty 1

## 2024-02-03 MED ORDER — ACETAMINOPHEN 650 MG RE SUPP: 650.0000 mg | Freq: Four times a day (QID) | RECTAL | Status: DC | PRN

## 2024-02-03 MED ORDER — FUROSEMIDE 10 MG/ML IJ SOLN
40.0000 mg | Freq: Two times a day (BID) | INTRAMUSCULAR | Status: DC
  Administered 2024-02-03 – 2024-02-04 (×4): 40 mg via INTRAVENOUS
  Filled 2024-02-03 (×4): qty 4

## 2024-02-03 MED ORDER — LAMOTRIGINE 100 MG PO TABS
200.0000 mg | ORAL_TABLET | Freq: Two times a day (BID) | ORAL | Status: DC
  Administered 2024-02-03 – 2024-02-08 (×12): 200 mg via ORAL
  Filled 2024-02-03 (×10): qty 2
  Filled 2024-02-03: qty 8
  Filled 2024-02-03 (×3): qty 2

## 2024-02-03 MED ORDER — ATORVASTATIN CALCIUM 40 MG PO TABS
40.0000 mg | ORAL_TABLET | Freq: Every day | ORAL | Status: DC
  Administered 2024-02-03 – 2024-02-08 (×6): 40 mg via ORAL
  Filled 2024-02-03 (×6): qty 1

## 2024-02-03 MED ORDER — ACETAMINOPHEN 325 MG PO TABS
650.0000 mg | ORAL_TABLET | Freq: Four times a day (QID) | ORAL | Status: DC | PRN
  Administered 2024-02-05 – 2024-02-07 (×3): 650 mg via ORAL
  Filled 2024-02-03 (×3): qty 2

## 2024-02-03 MED ORDER — INSULIN ASPART 100 UNIT/ML IJ SOLN: 0.0000 [IU] | Freq: Every day | INTRAMUSCULAR | Status: DC

## 2024-02-03 NOTE — ED Notes (Signed)
 Patient transported to echo ?

## 2024-02-03 NOTE — H&P (Addendum)
 History and Physical    Brandi Gomez WGN:[id number] DOB: [birth year] DOA: 02/02/2024  PCP: Loura Back, NP   Patient coming from: Home   Chief Complaint: SOB   HPI: Brandi Gomez is a 53 y.o. female with medical history significant for hypertension, type 2 diabetes mellitus, BMI 53, bipolar disorder, seizures, OSA, and chronic HFrEF who presents with shortness of breath.  Patient reports that she began developing increased dyspnea 1 week ago and has progressively worsened.  She has had a nonproductive cough associated with this.  She denies any chest pain, has not noticed any change in her chronic bilateral leg swelling, and reports orthopnea but notes that it is chronic.  She has not documented any fevers recently.  She was taking Mucinex and cough drops at home but continued to worsen, sought evaluation at an urgent care today, but was found to be hypoxic and was directed to the ED.  ED Course: Upon arrival to the ED, patient is found to be afebrile and saturating well on 50 L/min via heated high flow nasal cannula with tachypnea, mild tachycardia, and elevated blood pressure.  Labs are most notable for creatinine 1.27, normal WBC, normal lactic acid, troponin 43, BNP 274, and negative COVID and influenza PCR.  Chest x-ray concerning for progressive cardiomegaly with suspected pulmonary edema.  CT chest is severely motion degraded but reveals cardiomegaly with suspected interstitial edema and small bilateral pleural effusions.  Blood cultures were collected in the ED and the patient was treated with Lasix, Zofran, acetaminophen, DuoNeb, Rocephin, and azithromycin.  Review of Systems:  All other systems reviewed and apart from HPI, are negative.  Past Medical History:  Diagnosis Date   Anemia    Anxiety    Bipolar 1 disorder (HCC)    Common migraine 05/19/2015   Depression    Diabetes mellitus, type II (HCC)    Hypertension    Irritable bowel syndrome (IBS)    Mild mental  retardation    Obesity    Partial complex seizure disorder with intractable epilepsy (HCC) 05/12/2014   Seizures (HCC)    intractable, sz 08/23/17   Sleep apnea    Stroke (HCC)    Type II or unspecified type diabetes mellitus without mention of complication, not stated as uncontrolled     Past Surgical History:  Procedure Laterality Date   BUBBLE STUDY  11/15/2019   Procedure: BUBBLE STUDY;  Surgeon: Lars Masson, MD;  Location: [facility] ENDOSCOPY;  Service: Cardiovascular;;   COLONOSCOPY     2012-normal , Dr Jarold Motto   ESOPHAGOGASTRODUODENOSCOPY     normal-Dr Jarold Motto 2012   LOOP RECORDER INSERTION N/A 05/30/2017   Procedure: Loop Recorder Insertion;  Surgeon: Regan Lemming, MD;  Location: [facility] INVASIVE CV LAB;  Service: Cardiovascular;  Laterality: N/A;   LOOP RECORDER REMOVAL N/A 03/04/2018   Procedure: LOOP RECORDER REMOVAL;  Surgeon: Regan Lemming, MD;  Location: [facility] INVASIVE CV LAB;  Service: Cardiovascular;  Laterality: N/A;   MYRINGOTOMY WITH TUBE PLACEMENT     MYRINGOTOMY WITH TUBE PLACEMENT Right 11/05/2017   Procedure: MYRINGOTOMY WITH TUBE PLACEMENT;  Surgeon: Suzanna Obey, MD;  Location: [facility] OR;  Service: ENT;  Laterality: Right;  right T Tube placement   NASAL SINUS SURGERY     TEE WITHOUT CARDIOVERSION N/A 05/30/2017   Procedure: TRANSESOPHAGEAL ECHOCARDIOGRAM (TEE);  Surgeon: Elease Hashimoto Deloris Ping, MD;  Location: [facility] ENDOSCOPY;  Service: Cardiovascular;  Laterality: N/A;   TEE WITHOUT CARDIOVERSION N/A 11/15/2019   Procedure: TRANSESOPHAGEAL ECHOCARDIOGRAM (  TEE);  Surgeon: Lars Masson, MD;  Location: [facility] ENDOSCOPY;  Service: Cardiovascular;  Laterality: N/A;    Social History:   reports that she has never smoked. She has never used smokeless tobacco. She reports that she does not drink alcohol and does not use drugs.  Allergies  Allergen Reactions   Amoxicillin Itching    Did it involve swelling of the face/tongue/throat, SOB, or low BP? No Did it involve  sudden or severe rash/hives, skin peeling, or any reaction on the inside of your mouth or nose? No Did you need to seek medical attention at a hospital or doctor's office? No When did it last happen?      within the past 10 years If all above answers are "NO", may proceed with cephalosporin use.    Lisinopril Swelling    Angioedema    Hydrocodone Other (See Comments)    Depressed    Tegretol [Carbamazepine] Swelling    Throat swells    Family History  Problem Relation Age of Onset   Diabetes Mother        passed away from accidental death   Hypertension Mother    Bipolar disorder Father    Diabetes Daughter    Leukemia Daughter    Ovarian cancer Maternal Aunt      Prior to Admission medications   Medication Sig Start Date End Date Taking? Authorizing Provider  acetaminophen (TYLENOL) 325 MG tablet Take 2 tablets (650 mg total) by mouth every 4 (four) hours as needed for mild pain (or temp > 37.5 C (99.5 F)). 10/19/19  Yes Angiulli, Mcarthur Rossetti, PA-C  ARIPiprazole (ABILIFY) 10 MG tablet Take 0.5 tablets (5 mg total) by mouth daily. 09/30/23  Yes Plovsky, Earvin Hansen, MD  atorvastatin (LIPITOR) 40 MG tablet TAKE 1 TABLET (40 MG TOTAL) BY MOUTH DAILY AT 6 PM FOR CHOLESTEROL 09/24/21  Yes Sagardia, Eilleen Kempf, MD  carvedilol (COREG) 6.25 MG tablet Take 6.25 mg by mouth 2 (two) times daily. 05/20/21  Yes [provider]  chlorhexidine (PERIDEX) 0.12 % solution Use as directed 15 mLs in the mouth or throat daily.   Yes [provider]  cloBAZam (ONFI) 10 MG tablet Take 10 mg by mouth at bedtime.   Yes [provider]  ferrous sulfate 325 (65 FE) MG tablet Take 325 mg by mouth daily with breakfast. 07/10/22  Yes [provider]  glipiZIDE (GLUCOTROL) 10 MG tablet Take 10 mg by mouth 2 (two) times daily. 04/27/22  Yes [provider]  ibuprofen (ADVIL) 200 MG tablet Take 400 mg by mouth every 6 (six) hours as needed for mild pain (pain score 1-3) or  headache.   Yes [provider]  lamoTRIgine (LAMICTAL) 100 MG tablet TAKE 3 TABLETS (300 MG TOTAL) BY MOUTH 2 (TWO) TIMES DAILY. Patient taking differently: Take 200 mg by mouth 2 (two) times daily. 12/09/23  Yes Camara, Amalia Hailey, MD  sitaGLIPtin (JANUVIA) 100 MG tablet Take 100 mg by mouth daily.   Yes [provider]  torsemide (DEMADEX) 10 MG tablet Take 10 mg by mouth daily.   Yes [provider]  Vitamin D, Ergocalciferol, (DRISDOL) 1.25 MG (50000 UNIT) CAPS capsule Take 1 capsule (50,000 Units total) by mouth every 7 (seven) days. 07/03/23  Yes Glean Salvo, NP  hydrOXYzine (VISTARIL) 25 MG capsule 1 QHS  1 qday PRN 02/12/23   Archer Asa, MD  methylphenidate (RITALIN) 5 MG tablet Take 1 tablet (5 mg total) by mouth 2 (two) times  daily. Patient not taking: Reported on 12/29/2019 10/29/19 01/01/20  Horton Chin, MD    Physical Exam: Vitals:   02/03/24 0045 02/03/24 0100 02/03/24 0104 02/03/24 0200  BP: (!) 146/102  (!) 146/102 (!) 152/108  Pulse: 71   80  Resp: (!) 25  (!) 21 20  Temp:  98.7 F (37.1 C)    TempSrc:  Axillary    SpO2: 96%   100%    Constitutional: NAD, no pallor or diaphoresis  Eyes: PERTLA, lids and conjunctivae normal ENMT: Mucous membranes are moist. Posterior pharynx clear of any exudate or lesions.   Neck: supple, no masses  Respiratory: Labored respirations, sitting upright, fine rales, no wheezing.  Cardiovascular: S1 & S2 heard, regular rate and rhythm. Pretibial pitting edema bilaterally.  Abdomen: no tenderness, soft. Bowel sounds active.  Musculoskeletal: no clubbing / cyanosis. No joint deformity upper and lower extremities.   Skin: no significant rashes, lesions, ulcers. Warm, dry, well-perfused. Neurologic: CN 2-12 grossly intact. Moving all extremities. Alert and oriented to person, place, and situation.  Psychiatric: Pleasant. Cooperative.    Labs and Imaging on Admission: I have personally reviewed following  labs and imaging studies  CBC: Recent Labs  Lab 02/02/24 1816  WBC 8.3  NEUTROABS 5.4  HGB 10.9*  HCT 35.0*  MCV 96.7  PLT 281   Basic Metabolic Panel: Recent Labs  Lab 02/02/24 1816  NA 137  K 4.2  CL 103  CO2 24  GLUCOSE 118*  BUN 11  CREATININE 1.27*  CALCIUM 8.6*  MG 2.0   GFR: CrCl cannot be calculated (Unknown ideal weight.). Liver Function Tests: Recent Labs  Lab 02/02/24 1816  AST 18  ALT 18  ALKPHOS 79  BILITOT 0.6  PROT 7.7  ALBUMIN 3.4*   No results for input(s): "LIPASE", "AMYLASE" in the last 168 hours. No results for input(s): "AMMONIA" in the last 168 hours. Coagulation Profile: No results for input(s): "INR", "PROTIME" in the last 168 hours. Cardiac Enzymes: No results for input(s): "CKTOTAL", "CKMB", "CKMBINDEX", "TROPONINI" in the last 168 hours. BNP (last 3 results) No results for input(s): "PROBNP" in the last 8760 hours. HbA1C: No results for input(s): "HGBA1C" in the last 72 hours. CBG: No results for input(s): "GLUCAP" in the last 168 hours. Lipid Profile: No results for input(s): "CHOL", "HDL", "LDLCALC", "TRIG", "CHOLHDL", "LDLDIRECT" in the last 72 hours. Thyroid Function Tests: No results for input(s): "TSH", "T4TOTAL", "FREET4", "T3FREE", "THYROIDAB" in the last 72 hours. Anemia Panel: No results for input(s): "VITAMINB12", "FOLATE", "FERRITIN", "TIBC", "IRON", "RETICCTPCT" in the last 72 hours. Urine analysis:    Component Value Date/Time   COLORURINE YELLOW 02/11/2023 0351   APPEARANCEUR HAZY (A) 02/11/2023 0351   LABSPEC 1.024 02/11/2023 0351   PHURINE 5.0 02/11/2023 0351   GLUCOSEU NEGATIVE 02/11/2023 0351   HGBUR NEGATIVE 02/11/2023 0351   BILIRUBINUR NEGATIVE 02/11/2023 0351   BILIRUBINUR negative 01/18/2021 1621   BILIRUBINUR negative 07/01/2015 1108   KETONESUR 5 (A) 02/11/2023 0351   PROTEINUR NEGATIVE 02/11/2023 0351   UROBILINOGEN 1.0 09/11/2021 1533   NITRITE NEGATIVE 02/11/2023 0351   LEUKOCYTESUR  NEGATIVE 02/11/2023 0351   Sepsis Labs: @LABRCNTIP (procalcitonin:4,lacticidven:4) )No results found for this or any previous visit (from the past 240 hours).   Radiological Exams on Admission: CT Chest Wo Contrast Result Date: 02/02/2024 CLINICAL DATA:  Cough, shortness of breath, pneumonia EXAM: CT CHEST WITHOUT CONTRAST TECHNIQUE: Multidetector CT imaging of the chest was performed following the standard protocol without IV contrast. RADIATION DOSE  REDUCTION: This exam was performed according to the departmental dose-optimization program which includes automated exposure control, adjustment of the mA and/or kV according to patient size and/or use of iterative reconstruction technique. COMPARISON:  Chest radiograph dated 02/02/2024 FINDINGS: Severely motion degraded imaging. Cardiovascular: Mild cardiomegaly.  No pericardial effusion. No evidence of thoracic aortic aneurysm. Mild atherosclerotic calcifications of the aortic arch. Mediastinum/Nodes: No suspicious mediastinal lymphadenopathy. Lungs/Pleura: Severe respiratory motion. Multifocal patchy/ground-glass opacities, right lung predominant. While poorly evaluated, this favors interstitial edema over multifocal pneumonia. Small right and trace left pleural effusions. No pneumothorax. Upper Abdomen: Visualized upper abdomen is grossly unremarkable. Musculoskeletal: Visualized osseous structures are within normal limits. IMPRESSION: Severely motion degraded imaging. Cardiomegaly with suspected interstitial edema, right lung predominant. Small bilateral pleural effusions. Aortic Atherosclerosis (ICD10-I70.0). Electronically Signed   By: Charline Bills M.D.   On: 02/02/2024 23:08   DG Chest 2 View Result Date: 02/02/2024 CLINICAL DATA:  Cough, hypoxia. EXAM: CHEST - 2 VIEW COMPARISON:  Radiographs 08/27/2021 and 10/31/2020.  CT 04/02/2005. FINDINGS: Progressive cardiac enlargement with poor definition of the pulmonary vasculature and increased diffuse  interstitial prominence, suspicious for pulmonary edema. No confluent airspace disease, significant pleural effusion or pneumothorax. The bones appear unchanged. IMPRESSION: Progressive cardiac enlargement with suspected pulmonary edema, suspicious for congestive heart. No confluent airspace disease or pleural effusion. Electronically Signed   By: Carey Bullocks M.D.   On: 02/02/2024 15:07    Assessment/Plan   1. Acute on chronic HFrEF; acute hypoxic respiratory failure  - Check EKG, continue diuresis with IV Lasix, continue Coreg, no ACE/ARB d/t angioedema with lisinopril, monitor weight and I/Os, monitor renal function and electrolytes, update echocardiogram, check procalcitonin   2. Elevated troponin  - HS troponin is 43 without chest pain and likely reflecting demand ischemia in setting of acute CHF with respiratory failure  - Check EKG, trend troponin, follow-up echo findings   3. Type II DM  - A1c was 7.9% in 2022  - Check CBGs and use low-intensity SSI for now    4. Bipolar disorder; seizures   - Abilify, hydroxyzine, Lamictal, clobazam    5. CKD 3A  - Appears close to baseline  - Renally-dose medications     6. OSA  - CPAP while sleeping     DVT prophylaxis: Lovenox  Code Status: Full  Level of Care: Level of care: Progressive Family Communication: none present  Disposition Plan:  Patient is from: Home  Anticipated d/c is to: TBD Anticipated d/c date is: 02/05/24  Patient currently: Pending improved respiratory status  Consults called: None  Admission status: Inpatient     Briscoe Deutscher, MD [facility]  02/03/2024, 3:00 AM

## 2024-02-03 NOTE — Progress Notes (Signed)
*  PRELIMINARY RESULTS* Echocardiogram 2D Echocardiogram has been performed.  Brandi Gomez 02/03/2024, 1:25 PM

## 2024-02-03 NOTE — Progress Notes (Signed)
 PHARMACY - PHYSICIAN COMMUNICATION CRITICAL VALUE ALERT - BLOOD CULTURE IDENTIFICATION (BCID)  Brandi Gomez is an 53 y.o. female who presented to [facility] on 02/02/2024 with a chief complaint of SOB. Patient afebrile WBC 10 PCT 0.45 - not currently on antibiotics BCID 1/2 Cx staph species  - probable contaminant  - no antibiotics needed    Name of physician (or Provider) Contacted: Dr Jerold Coombe.D. CPP, BCPS Clinical Pharmacist [phone] 02/03/2024 9:29 PM     Results for orders placed or performed during the hospital encounter of 02/02/24  Blood Culture ID Panel (Reflexed) (Collected: 02/02/2024  6:16 PM)  Result Value Ref Range   Enterococcus faecalis NOT DETECTED NOT DETECTED   Enterococcus Faecium NOT DETECTED NOT DETECTED   Listeria monocytogenes NOT DETECTED NOT DETECTED   Staphylococcus species DETECTED (A) NOT DETECTED   Staphylococcus aureus (BCID) NOT DETECTED NOT DETECTED   Staphylococcus epidermidis NOT DETECTED NOT DETECTED   Staphylococcus lugdunensis NOT DETECTED NOT DETECTED   Streptococcus species NOT DETECTED NOT DETECTED   Streptococcus agalactiae NOT DETECTED NOT DETECTED   Streptococcus pneumoniae NOT DETECTED NOT DETECTED   Streptococcus pyogenes NOT DETECTED NOT DETECTED   A.calcoaceticus-baumannii NOT DETECTED NOT DETECTED   Bacteroides fragilis NOT DETECTED NOT DETECTED   Enterobacterales NOT DETECTED NOT DETECTED   Enterobacter cloacae complex NOT DETECTED NOT DETECTED   Escherichia coli NOT DETECTED NOT DETECTED   Klebsiella aerogenes NOT DETECTED NOT DETECTED   Klebsiella oxytoca NOT DETECTED NOT DETECTED   Klebsiella pneumoniae NOT DETECTED NOT DETECTED   Proteus species NOT DETECTED NOT DETECTED   Salmonella species NOT DETECTED NOT DETECTED   Serratia marcescens NOT DETECTED NOT DETECTED   Haemophilus influenzae NOT DETECTED NOT DETECTED   Neisseria meningitidis NOT DETECTED NOT DETECTED   Pseudomonas aeruginosa  NOT DETECTED NOT DETECTED   Stenotrophomonas maltophilia NOT DETECTED NOT DETECTED   Candida albicans NOT DETECTED NOT DETECTED   Candida auris NOT DETECTED NOT DETECTED   Candida glabrata NOT DETECTED NOT DETECTED   Candida krusei NOT DETECTED NOT DETECTED   Candida parapsilosis NOT DETECTED NOT DETECTED   Candida tropicalis NOT DETECTED NOT DETECTED   Cryptococcus neoformans/gattii NOT DETECTED NOT DETECTED    [name] 02/03/2024  9:27 PM

## 2024-02-03 NOTE — ED Notes (Signed)
 Patient returned to room from Echo.

## 2024-02-03 NOTE — Progress Notes (Signed)
 [facility] Progress Note  Patient: Brandi Gomez    WUJ:[id number]  DOA: 02/02/2024     Date of Service: the patient was seen and examined on 02/03/2024  Chief Complaint  Patient presents with   Shortness of Breath   Brief hospital course: Brandi Gomez is a 53 y.o. female with medical history significant for hypertension, type 2 diabetes mellitus, BMI 53, bipolar disorder, seizures, OSA, and chronic HFrEF who presents with shortness of breath.   Patient reports that she began developing increased dyspnea 1 week ago and has progressively worsened.  She has had a nonproductive cough associated with this.  She denies any chest pain, has not noticed any change in her chronic bilateral leg swelling, and reports orthopnea but notes that it is chronic.  She has not documented any fevers recently.  She was taking Mucinex and cough drops at home but continued to worsen, sought evaluation at an urgent care today, but was found to be hypoxic and was directed to the ED.   ED Course: Upon arrival to the ED, patient is found to be afebrile and saturating well on 50 L/min via heated high flow nasal cannula with tachypnea, mild tachycardia, and elevated blood pressure.  Labs are most notable for creatinine 1.27, normal WBC, normal lactic acid, troponin 43, BNP 274, and negative COVID and influenza PCR.  Chest x-ray concerning for progressive cardiomegaly with suspected pulmonary edema.  CT chest is severely motion degraded but reveals cardiomegaly with suspected interstitial edema and small bilateral pleural effusions.   Blood cultures were collected in the ED and the patient was treated with Lasix, Zofran, acetaminophen, DuoNeb, Rocephin, and azithromycin.   Assessment and Plan:  # Acute on chronic HFrEF; acute hypoxic respiratory failure  continue diuresis with IV Lasix,  continue Coreg, no ACE/ARB d/t angioedema with lisinopril monitor weight and I/Os, monitor renal function and  electrolytes, BNP 273 slightly elevated F/u TTE procalcitonin 0.45 wnl   # Elevated troponin  - HS troponin is 43--48 without chest pain and likely reflecting demand ischemia in setting of acute CHF with respiratory failure  EKG non ischemic.  follow-up echo findings    # Type II DM  - A1c was 7.9% in 2022  - Check CBGs and use low-intensity SSI for now     # Bipolar disorder and seizure disorder - Abilify, hydroxyzine, Lamictal, clobazam   Continue supportive care Continue seizure precautions  # CKD 3A  Baseline creatinine 1.3 Cr 1.32 - Renally-dose medications    Monitor renal functions while on diuretics.  # OSA  -Continue CPAP qhs, needs sleep study outpatient  There is no height or weight on file to calculate BMI.  Interventions:  Diet: Heart healthy diet, fluid striction 1.5 L/day DVT Prophylaxis: Subcutaneous Lovenox   Advance goals of care discussion: Full code  Family Communication:  family was not present at bedside, at the time of interview.  The pt provided permission to discuss medical plan with the family. Opportunity was given to ask question and all questions were answered satisfactorily.   Disposition:  Pt is from Home, admitted with Resp Failure, still has sob, which precludes a safe discharge. Discharge to home, when stable, may need few days to improve.  Subjective: No significant events overnight, patient still very short of breath.  Denied any chest pain or palpitation, no any other complaints.  Overall she feels some improvement.  Physical Exam: General: Sitting on chair, mild respiratory distress. Appear in no distress, affect appropriate  Eyes: PERRLA ENT: Oral Mucosa Clear, moist  Neck: no JVD,  Cardiovascular: S1 and S2 Present, no Murmur,  Respiratory: Equal air entry bilaterally, mild crackles, no wheezes Abdomen: Bowel Sound present, Soft and no tenderness,  Skin: no rashes Extremities: 2-3+ pedal edema, no calf  tenderness Neurologic: without any new focal findings Gait not checked due to patient safety concerns  Vitals:   02/03/24 1118 02/03/24 1154 02/03/24 1155 02/03/24 1231  BP: (!) 141/71 (!) 144/83    Pulse: 66 64    Resp: 17 14    Temp:   98.1 F (36.7 C)   TempSrc:   Oral   SpO2: 100% 100%  100%    Intake/Output Summary (Last 24 hours) at 02/03/2024 1312 Last data filed at 02/03/2024 0335 Gross per 24 hour  Intake 350 ml  Output 1000 ml  Net -650 ml   There were no vitals filed for this visit.  Data Reviewed: I have personally reviewed and interpreted daily labs, tele strips, imagings as discussed above. I reviewed all nursing notes, pharmacy notes, vitals, pertinent old records I have discussed plan of care as described above with RN and patient/family.  CBC: Recent Labs  Lab 02/02/24 1816 02/03/24 0523  WBC 8.3 10.8*  NEUTROABS 5.4  --   HGB 10.9* 11.1*  HCT 35.0* 35.6*  MCV 96.7 95.4  PLT 281 248   Basic Metabolic Panel: Recent Labs  Lab 02/02/24 1816 02/03/24 0523  NA 137 137  K 4.2 3.7  CL 103 103  CO2 24 27  GLUCOSE 118* 156*  BUN 11 12  CREATININE 1.27* 1.32*  CALCIUM 8.6* 8.5*  MG 2.0 1.9    Studies: CT Chest Wo Contrast Result Date: 02/02/2024 CLINICAL DATA:  Cough, shortness of breath, pneumonia EXAM: CT CHEST WITHOUT CONTRAST TECHNIQUE: Multidetector CT imaging of the chest was performed following the standard protocol without IV contrast. RADIATION DOSE REDUCTION: This exam was performed according to the departmental dose-optimization program which includes automated exposure control, adjustment of the mA and/or kV according to patient size and/or use of iterative reconstruction technique. COMPARISON:  Chest radiograph dated 02/02/2024 FINDINGS: Severely motion degraded imaging. Cardiovascular: Mild cardiomegaly.  No pericardial effusion. No evidence of thoracic aortic aneurysm. Mild atherosclerotic calcifications of the aortic arch.  Mediastinum/Nodes: No suspicious mediastinal lymphadenopathy. Lungs/Pleura: Severe respiratory motion. Multifocal patchy/ground-glass opacities, right lung predominant. While poorly evaluated, this favors interstitial edema over multifocal pneumonia. Small right and trace left pleural effusions. No pneumothorax. Upper Abdomen: Visualized upper abdomen is grossly unremarkable. Musculoskeletal: Visualized osseous structures are within normal limits. IMPRESSION: Severely motion degraded imaging. Cardiomegaly with suspected interstitial edema, right lung predominant. Small bilateral pleural effusions. Aortic Atherosclerosis (ICD10-I70.0). Electronically Signed   By: Charline Bills M.D.   On: 02/02/2024 23:08   DG Chest 2 View Result Date: 02/02/2024 CLINICAL DATA:  Cough, hypoxia. EXAM: CHEST - 2 VIEW COMPARISON:  Radiographs 08/27/2021 and 10/31/2020.  CT 04/02/2005. FINDINGS: Progressive cardiac enlargement with poor definition of the pulmonary vasculature and increased diffuse interstitial prominence, suspicious for pulmonary edema. No confluent airspace disease, significant pleural effusion or pneumothorax. The bones appear unchanged. IMPRESSION: Progressive cardiac enlargement with suspected pulmonary edema, suspicious for congestive heart. No confluent airspace disease or pleural effusion. Electronically Signed   By: Carey Bullocks M.D.   On: 02/02/2024 15:07    Scheduled Meds:  ARIPiprazole  5 mg Oral Daily   atorvastatin  40 mg Oral Daily   carvedilol  6.25 mg Oral BID WC  cloBAZam  10 mg Oral QHS   enoxaparin (LOVENOX) injection  40 mg Subcutaneous Daily   furosemide  40 mg Intravenous BID   insulin aspart  0-5 Units Subcutaneous QHS   insulin aspart  0-6 Units Subcutaneous TID WC   lamoTRIgine  200 mg Oral BID   sodium chloride flush  3 mL Intravenous Q12H   Continuous Infusions: PRN Meds: acetaminophen **OR** acetaminophen, hydrOXYzine, oxyCODONE, polyethylene glycol  Time spent: 35  minutes  Author: Gillis Santa. MD [facility] 02/03/2024 1:12 PM  To reach On-call, see care teams to locate the attending and reach out to them via [email or web address]. If 7PM-7AM, please contact night-coverage If you still have difficulty reaching the attending provider, please page the [facility] (Director on Call) for [facility] on amion for assistance.

## 2024-02-03 NOTE — Plan of Care (Signed)

## 2024-02-03 NOTE — Progress Notes (Signed)
 Patient found with HHFNC off her nose, patient's vitals were stable, RT placed patient on 6L [place].

## 2024-02-03 NOTE — ED Notes (Signed)
 Patient assisted to recline - CCM/pulse ox in place. HHFNC in place. Call light within reach. Pt educated on fall precautions while in recliner.

## 2024-02-04 ENCOUNTER — Telehealth (HOSPITAL_COMMUNITY): Payer: Self-pay | Admitting: Pharmacy Technician

## 2024-02-04 ENCOUNTER — Encounter (HOSPITAL_COMMUNITY): Payer: Self-pay | Admitting: Family Medicine

## 2024-02-04 ENCOUNTER — Other Ambulatory Visit (HOSPITAL_COMMUNITY): Payer: Self-pay

## 2024-02-04 DIAGNOSIS — R7989 Other specified abnormal findings of blood chemistry: Secondary | ICD-10-CM | POA: Diagnosis not present

## 2024-02-04 DIAGNOSIS — N1831 Chronic kidney disease, stage 3a: Secondary | ICD-10-CM | POA: Diagnosis not present

## 2024-02-04 DIAGNOSIS — J9601 Acute respiratory failure with hypoxia: Secondary | ICD-10-CM | POA: Diagnosis not present

## 2024-02-04 DIAGNOSIS — I5023 Acute on chronic systolic (congestive) heart failure: Secondary | ICD-10-CM | POA: Diagnosis not present

## 2024-02-04 LAB — GLUCOSE, CAPILLARY
Glucose-Capillary: 131 mg/dL — ABNORMAL HIGH (ref 70–99)
Glucose-Capillary: 170 mg/dL — ABNORMAL HIGH (ref 70–99)
Glucose-Capillary: 131 mg/dL — ABNORMAL HIGH (ref 70–99)
Glucose-Capillary: 142 mg/dL — ABNORMAL HIGH (ref 70–99)

## 2024-02-04 LAB — CBC
HCT: 32.9 % — ABNORMAL LOW (ref 36.0–46.0)
Hemoglobin: 10.4 g/dL — ABNORMAL LOW (ref 12.0–15.0)
MCH: 29.8 pg (ref 26.0–34.0)
MCHC: 31.6 g/dL (ref 30.0–36.0)
MCV: 94.3 fL (ref 80.0–100.0)
Platelets: 271 10*3/uL (ref 150–400)
RBC: 3.49 MIL/uL — ABNORMAL LOW (ref 3.87–5.11)
RDW: 14.2 % (ref 11.5–15.5)
WBC: 7.4 10*3/uL (ref 4.0–10.5)
nRBC: 0 % (ref 0.0–0.2)

## 2024-02-04 LAB — PHOSPHORUS: Phosphorus: 4.4 mg/dL (ref 2.5–4.6)

## 2024-02-04 LAB — BASIC METABOLIC PANEL
Anion gap: 11 (ref 5–15)
BUN: 15 mg/dL (ref 6–20)
CO2: 28 mmol/L (ref 22–32)
Calcium: 8.6 mg/dL — ABNORMAL LOW (ref 8.9–10.3)
Chloride: 100 mmol/L (ref 98–111)
Creatinine, Ser: 1.49 mg/dL — ABNORMAL HIGH (ref 0.44–1.00)
GFR, Estimated: 41 mL/min — ABNORMAL LOW (ref >60)
Glucose, Bld: 148 mg/dL — ABNORMAL HIGH (ref 70–99)
Potassium: 3.5 mmol/L (ref 3.5–5.1)
Sodium: 139 mmol/L (ref 135–145)

## 2024-02-04 LAB — HEMOGLOBIN A1C
Hgb A1c MFr Bld: 7.3 % — ABNORMAL HIGH (ref 4.8–5.6)
Mean Plasma Glucose: 163 mg/dL

## 2024-02-04 LAB — MAGNESIUM: Magnesium: 1.9 mg/dL (ref 1.7–2.4)

## 2024-02-04 LAB — CULTURE, BLOOD (ROUTINE X 2)

## 2024-02-04 MED ORDER — CYANOCOBALAMIN 1000 MCG/ML IJ SOLN
1000.0000 ug | Freq: Every day | INTRAMUSCULAR | Status: AC
  Administered 2024-02-04 – 2024-02-06 (×3): 1000 ug via INTRAMUSCULAR
  Filled 2024-02-04 (×3): qty 1

## 2024-02-04 MED ORDER — POTASSIUM CHLORIDE CRYS ER 20 MEQ PO TBCR
40.0000 meq | EXTENDED_RELEASE_TABLET | Freq: Once | ORAL | Status: AC
  Administered 2024-02-04: 40 meq via ORAL
  Filled 2024-02-04: qty 2

## 2024-02-04 NOTE — Progress Notes (Signed)
 Heart Failure Stewardship Pharmacist Progress Note   PCP: Loura Back, NP PCP-Cardiologist: Chilton Si, MD   HPI:  Brandi Gomez is a 69 YOF with PMH of HTN, T2DM, CVA, obesity, bipolar disorder, seizures, OSA, and HFrEF.  She presented to to [facility] ED on 02/02/24 with primary complaint of SOB. The patient reported increased DOE for the past week. She has had a nonproductive cough as well. She denied any chest pain or palpitations. She reports no changes to her already present LEE. Vitals upon arrival was afebrile, SpO2 96% on HFNC 5 L of O2, BP 146/102 mmHg, and HR 71 bpm. Labs obtained WBC 8.3, Scr 1.27 (BL ~1.1), BUN 11, K 4.2, Na 137, flat troponins, BNP 274. CXR showed progressive cardiac enlargement with suspected pulmonary edema. She was given furosemide IV and resumed on carvedilol. ECHO 02/03/24 LVEF 30-35%, LV GHK, RV normal, grade 2 DD.  Today patient is doing better. She is still having SOB and requiring [place] 4 L supplemental oxygen. Patient has limited mobility at baseline and has not attempted to get out of bed yet. She has bilateral 2+ pitting LEE extending to her knees. She denies any dizziness or lightheadedness. No reports of PND and orthopnea. No reports of chest pain or palpitations. Her appetite is OK. Educated patient on use of diuresis to improve her volume status.   Patient has history of angioedema on 05/2017 with lisinopril requiring admission to the hospital but not requiring artificial airway placement. She should not be started on RAASi therapy.  Given this patient's limited mobility at baseline, she would not be a good candidate for SGLT2i therapy with high risk or genitourinary tract infections. Patient also reports frequently (1x/week) not being able to retain urine before making it to the toilet due to lack of mobility.   Current HF Medications: Diuretic: furosemide 40 mg IV BID Beta Blocker: carvedilol 6.25 mg BID  Prior to admission HF  Medications: Diuretic: torsemide 10 mg daily Beta blocker: carvedilol 6.25 mg BID  Pertinent Lab Values: Serum creatinine 1.49, BUN 15, Potassium 3.5, Sodium 139, Magnesium 1.9 02/03/24: A1c 7.3%  Vital Signs: Weight: 287 lbs (admission weight: 287 lbs) Blood pressure: ~100-110/60 mmHg Heart rate: 60-70 bpm  I/O: net -0.27 L yesterday; net -0.9 L since admission; suspect not properly documented with patient reporting good UOP  Medication Assistance / Insurance Benefits Check: Does the patient have prescription insurance?  Yes Type of insurance plan: [employer] Medicare Part D  Does the patient qualify for medication assistance through manufacturers or grants?   No; $0 co-pays for brand-name medications  Outpatient Pharmacy:  Prior to admission outpatient pharmacy: [facility] [place], [place] Is the patient willing to use [facility] TOC pharmacy at discharge? Yes Is the patient willing to transition their outpatient pharmacy to utilize a [facility] outpatient pharmacy?   No    Assessment: 1. Acute on chronic systolic + diastolic CHF (LVEF 30-35%), ischemic work-up pending. NYHA class II-III symptoms. - Patient remains hypervolemic with continued SOB requiring [place] 4L of supplemental oxygen and bilateral 2+ pitting LEE. Her I/O are likely from yesterday are likely inaccurate - Continue current diuresis - Patient with low BP (~100/70 mmHg) and AKI with Scr up to 1.49 mg/dL - continue low-dose BB; consider starting MRA once AKI improves - Patient has history of angioedema with lisinopril - do not start RAASi - Patient has limited mobility at baseline and at home, with SGLT2i therapy this would predispose the patient to genitourinary tract  infections - would not start SGLT2i - Serum potassium is low at 3.5 mmol/L - requires replacement   Plan: 1) Medication changes recommended at this time: - Continue furosemide 40 mg IV BID - Continue carvedilol 6.25 mg BID - Give potassium chloride 40 mEq PO  x1  2) Patient assistance: - Patient would like first fills of medications at [facility] and [facility] [address]  3)  Education  - To be completed prior to discharge  Wilmer Floor, PharmD PGY2 Cardiology Pharmacy Resident Heart Failure Stewardship Phone [phone]

## 2024-02-04 NOTE — Telephone Encounter (Signed)
 Pharmacy Patient Advocate Encounter  Insurance verification completed.   The patient is insured through [employer] claim for [place]. Currently a quantity of 90 is a 90 day supply and the co-pay is $0.  This test claim was processed through [facility]- copay amounts may vary at other pharmacies due to pharmacy/plan contracts, or as the patient moves through the different stages of their insurance plan.   Archer Asa, [name]

## 2024-02-04 NOTE — Plan of Care (Signed)
   Problem: Education: Goal: Ability to describe self-care measures that may prevent or decrease complications (Diabetes Survival Skills Education) will improve Outcome: Progressing Goal: Individualized Educational Video(s) Outcome: Progressing

## 2024-02-04 NOTE — Consult Note (Addendum)
 Cardiology Consultation   Patient ID: Brandi Gomez MRN: [id number]; DOB: Mar 29, 1971  Admit date: 02/02/2024 Date of Consult: 02/04/2024  PCP:  Loura Back, NP   [facility] Providers Cardiologist:  Chilton Si, MD  Electrophysiologist:  Regan Lemming, MD       Patient Profile:   Brandi Gomez is a 53 y.o. female with a hx of chronic systolic and diastolic HF, HTN, HLD, DM2, morbid obesity, CKD 3, OSA on CPAP, bipolar, anxiety, epilepsy and CVA who is being seen 02/04/2024 for the evaluation of heart failure at the request of Dr. Jarvis Newcomer.  History of Present Illness:   Brandi Gomez is a 53 year old female with above medical history.  She has history of HF documented since 2018.  Patient was last seen in cardiac clinic on 01/14/2024 for colonoscopy clearance with Dr. [place].  At this time her last TEE on 11/15/2019 showed EF of 35 to 40%.  On exam she was not volume overloaded.  HF management included isosorbide, hydralazine and Coreg.  Did not tolerate high-dose beta-blocker in the past due to bradycardia.  Not able to use ARB or Arni due to history of angioedema on lisinopril.  Patient was scheduled for a follow-up echo before being cleared for colonoscopy.  However, she presented to hospital prior to scheduled ECHO.   Prior to ED arrival patient was evaluated in urgent care and found to be hypoxic and directed to ED. Patient arrived to ED on 02/02/2024 for shortness of breath associated with nonproductive cough and orthopnea.  Patient was noted to be afebrile and saturating well on 5 L a minute via high flow [place] with tachypnea, mild tachycardia and elevated BP.  After DuoNeb treatment, she had a coughing spell and subsequent desaturation.  She was placed on nonrebreather and continued having worsening tachypnea and increased WOB.  She was placed on BiPAP and developed episodes of emesis treated with Zofran. She was placed on heated high flow to minimize risk of  aspiration.  Blood cultures were collected in the ED.  She was started on empirical treatment for pneumonia.  Patient was treated with Lasix, Zofran, acetaminophen, Rocephin, and azithromycin.  Labs normal for WBC, lactic acid and negative COVID and influenza PCR.  CR 1.27 >1.32> 1.49 , EGFR 50> 48> 41 (stable baseline)  hsTN flat baseline 43>48, A1C 7.3 Anemia 10.9>11.1>10.4 (stable baseline) Blood culture detected Staphylococcus EKG: NSR, HR 82, RAD, borderline Prolonged QTC ~500, baseline artifact with non specific ST changes CXR: Progressive cardiac enlargement with suspected pulmonary edema, suspicious for congestive heart failure.  No confluent airspace, disease or pleural effusion. CT chest 02/02/24: Cardiomegaly with suspected interstitial edema, right lung predominant.  Small bilateral pleural effusion.  Mild aortic atherosclerosis Echo 02/03/24: LVEF 30 to 35%.  LV moderately decreased function.  LV global hypokinesis left ventricle mildly dilated.  G2DD.  TR unable to be assessed. LA moderately dilated.  Trivial MVR.  Aortic dilation with ascending aorta measuring 37 mm.  RAP of 8 mmHg.  Cardiology was consulted for evaluation of heart failure.  Patient reported having shortness of breath for 1 to 2 weeks, progressively got worse the last 4 days.  She has noted a progressive decline in activity and SOB at rest. Reported orthopnea, nonproductive cough, and LE edema. Denied PND, chest pain, palpitation or dizziness.  Prior to this hospitalization patient only use oxygen at night via CPAP.  With IV Lasix, patient reports improvement in shortness of breath, LE edema, and good urine  output.  Patient admits to having memory loss concerns and has her daughter managing her medications so that she does not miss doses however daughter has learning disability.  Patient does not recall use of SGLT in the past but reports prior UTIs.  She denied any ischemic evaluation in the past.  She also is unaware if  they have talked anything about medicines like Wegovy or Ozempic/Zepbound.  She did mention something about Mounjaro but never started.  It is possible that she had been on Rybelsus but did not notice any benefit and therefore was placed back on Januvia.  Denies any tobacco use, recreational drug use or alcohol use.  Admits to poor dieting and tends to overeat.  Denies any exercise.  She lives with her daughter and is unemployed due to disability.    Past Medical History:  Diagnosis Date   Anemia    Anxiety    Bipolar 1 disorder (HCC)    Common migraine 05/19/2015   Depression    Diabetes mellitus, type II (HCC)    Hypertension    Irritable bowel syndrome (IBS)    Mild mental retardation    Obesity    Partial complex seizure disorder with intractable epilepsy (HCC) 05/12/2014   Seizures (HCC)    intractable, sz 08/23/17   Sleep apnea    Stroke (HCC)    Type II or unspecified type diabetes mellitus without mention of complication, not stated as uncontrolled     Past Surgical History:  Procedure Laterality Date   BUBBLE STUDY  11/15/2019   Procedure: BUBBLE STUDY;  Surgeon: Lars Masson, MD;  Location: [facility] ENDOSCOPY;  Service: Cardiovascular;;   COLONOSCOPY     2012-normal , Dr Jarold Motto   ESOPHAGOGASTRODUODENOSCOPY     normal-Dr Jarold Motto 2012   LOOP RECORDER INSERTION N/A 05/30/2017   Procedure: Loop Recorder Insertion;  Surgeon: Regan Lemming, MD;  Location: [facility] INVASIVE CV LAB;  Service: Cardiovascular;  Laterality: N/A;   LOOP RECORDER REMOVAL N/A 03/04/2018   Procedure: LOOP RECORDER REMOVAL;  Surgeon: Regan Lemming, MD;  Location: [facility] INVASIVE CV LAB;  Service: Cardiovascular;  Laterality: N/A;   MYRINGOTOMY WITH TUBE PLACEMENT     MYRINGOTOMY WITH TUBE PLACEMENT Right 11/05/2017   Procedure: MYRINGOTOMY WITH TUBE PLACEMENT;  Surgeon: Suzanna Obey, MD;  Location[facility] OR;  Service: ENT;  Laterality: Right;  right T Tube placement   NASAL SINUS SURGERY     TEE  WITHOUT CARDIOVERSION N/A 05/30/2017   Procedure: TRANSESOPHAGEAL ECHOCARDIOGRAM (TEE);  Surgeon: Elease Hashimoto Deloris Ping, MD;  Location: [facility] ENDOSCOPY;  Service: Cardiovascular;  Laterality: N/A;   TEE WITHOUT CARDIOVERSION N/A 11/15/2019   Procedure: TRANSESOPHAGEAL ECHOCARDIOGRAM (TEE);  Surgeon: Lars Masson, MD;  Location: [facility] ENDOSCOPY;  Service: Cardiovascular;  Laterality: N/A;       Inpatient Medications: Scheduled Meds:  ARIPiprazole  5 mg Oral Daily   atorvastatin  40 mg Oral Daily   carvedilol  6.25 mg Oral BID WC   cloBAZam  10 mg Oral QHS   cyanocobalamin  1,000 mcg Intramuscular Daily   enoxaparin (LOVENOX) injection  40 mg Subcutaneous Daily   furosemide  40 mg Intravenous BID   insulin aspart  0-5 Units Subcutaneous QHS   insulin aspart  0-6 Units Subcutaneous TID WC   lamoTRIgine  200 mg Oral BID   sodium chloride flush  3 mL Intravenous Q12H   Continuous Infusions:  PRN Meds: acetaminophen **OR** acetaminophen, hydrOXYzine, oxyCODONE, polyethylene glycol  Allergies:    Allergies  Allergen Reactions   Amoxicillin Itching    Did it involve swelling of the face/tongue/throat, SOB, or low BP? No Did it involve sudden or severe rash/hives, skin peeling, or any reaction on the inside of your mouth or nose? No Did you need to seek medical attention at a hospital or doctor's office? No When did it last happen?      within the past 10 years If all above answers are "NO", may proceed with cephalosporin use.    Lisinopril Swelling    Angioedema    Hydrocodone Other (See Comments)    Depressed    Tegretol [Carbamazepine] Swelling    Throat swells    Social History:   Social History   Socioeconomic History   Marital status: Single    Spouse name: [name]   Number of children: 3   Years of education: 12   Highest education level: Not on file  Occupational History   Occupation: disabled    Employer: DISABLED  Tobacco Use   Smoking status: Never   Smokeless  tobacco: Never  Vaping Use   Vaping status: Never Used  Substance and Sexual Activity   Alcohol use: No   Drug use: No   Sexual activity: Yes  Other Topics Concern   Not on file  Social History Narrative   Patient lives at home with daughter.    Patient has 3 children.    Patient is right handed.    Patient has a high school education.    Patient is on disability   Patient drinks 2 cups of caffeine daily.   Social Drivers of Corporate investment banker Strain: Low Risk  (02/04/2024)   Overall Financial Resource Strain (CARDIA)    Difficulty of Paying Living Expenses: Not very hard  Food Insecurity: No Food Insecurity (02/03/2024)   Hunger Vital Sign    Worried About Running Out of Food in the Last Year: Never true    Ran Out of Food in the Last Year: Never true  Transportation Needs: No Transportation Needs (02/03/2024)   PRAPARE - Administrator, Civil Service (Medical): No    Lack of Transportation (Non-Medical): No  Physical Activity: Not on file  Stress: Not on file  Social Connections: Not on file  Intimate Partner Violence: Not At Risk (02/03/2024)   Humiliation, Afraid, Rape, and Kick questionnaire    Fear of Current or Ex-Partner: No    Emotionally Abused: No    Physically Abused: No    Sexually Abused: No    Family History:   Family History  Problem Relation Age of Onset   Diabetes Mother        passed away from accidental death   Hypertension Mother    Bipolar disorder Father    Diabetes Daughter    Leukemia Daughter    Ovarian cancer Maternal Aunt      ROS:  Please see the history of present illness.  All other ROS reviewed and negative.     Physical Exam/Data:   Vitals:   02/04/24 0446 02/04/24 0746 02/04/24 1112 02/04/24 1608  BP: 116/62 (!) 123/55 135/66 (!) 116/55  Pulse: 69 70 64 70  Resp: (!) 22 16 20 20   Temp: 98.6 F (37 C) 97.8 F (36.6 C) 97.9 F (36.6 C) 97.9 F (36.6 C)  TempSrc: Oral Oral Oral Oral  SpO2: (!) 72% 94%  95% 97%  Weight: 130.6 kg     Height:  Intake/Output Summary (Last 24 hours) at 02/04/2024 2057 Last data filed at 02/04/2024 1805 Gross per 24 hour  Intake 480 ml  Output 400 ml  Net 80 ml      02/04/2024    4:46 AM 02/03/2024    2:18 PM 01/14/2024    1:56 PM  Last 3 Weights  Weight (lbs) 287 lb 14.4 oz 287 lb 14.7 oz 297 lb 9.6 oz  Weight (kg) 130.591 kg 130.6 kg 134.99 kg     Body mass index is 51 kg/m.  General:  Laying in bed at 45 degree angle without any acute distress, on [place] 5L with O2 95-96%.  HEENT: normal Neck: no JVD Vascular: No carotid bruits; Distal pulses 2+ bilaterally Cardiac:  normal S1, S2; RRR; no murmur  Lungs:  diminished breath sound bilaterally Abd: soft, nontender, no hepatomegaly  Ext: +2 bilateral edema  Musculoskeletal:  No deformities, BUE and BLE strength normal and equal Skin: warm and dry  Neuro:  CNs 2-12 intact, no focal abnormalities noted Psych:  Normal affect   EKG:  The EKG was personally reviewed and demonstrates:  NSR, HR 82, RAD, borderline Prolonged QTC ~500, baseline artifact with non specific ST changes Telemetry:  Telemetry was personally reviewed and demonstrates:  NSR, HR 60-70's  Relevant CV Studies: Echo 02/03/24  1. Left ventricular ejection fraction, by estimation, is 30 to 35%. The  left ventricle has moderately decreased function. The left ventricle  demonstrates global hypokinesis. The left ventricular internal cavity size  was mildly dilated. Left ventricular  diastolic parameters are consistent with Grade II diastolic dysfunction  (pseudonormalization).   2. Right ventricular systolic function is normal. The right ventricular  size is normal. Tricuspid regurgitation signal is inadequate for assessing  PA pressure.   3. Left atrial size was moderately dilated.   4. The mitral valve is normal in structure. Trivial mitral valve  regurgitation. No evidence of mitral stenosis.   5. The aortic valve is tricuspid.  Aortic valve regurgitation is not  visualized. No aortic stenosis is present.   6. Aortic dilatation noted. There is borderline dilatation of the  ascending aorta, measuring 37 mm.   7. The inferior vena cava is normal in size with <50% respiratory  variability, suggesting right atrial pressure of 8 mmHg.    Laboratory Data:  High Sensitivity Troponin:   Recent Labs  Lab 02/02/24 2054 02/03/24 0523  TROPONINIHS 43* 48*     Chemistry Recent Labs  Lab 02/02/24 1816 02/03/24 0523 02/04/24 0254  NA 137 137 139  K 4.2 3.7 3.5  CL 103 103 100  CO2 24 27 28   GLUCOSE 118* 156* 148*  BUN 11 12 15   CREATININE 1.27* 1.32* 1.49*  CALCIUM 8.6* 8.5* 8.6*  MG 2.0 1.9 1.9  GFRNONAA 50* 48* 41*  ANIONGAP 10 7 11     Recent Labs  Lab 02/02/24 1816  PROT 7.7  ALBUMIN 3.4*  AST 18  ALT 18  ALKPHOS 79  BILITOT 0.6   Lipids No results for input(s): "CHOL", "TRIG", "HDL", "LABVLDL", "LDLCALC", "CHOLHDL" in the last 168 hours.  Hematology Recent Labs  Lab 02/02/24 1816 02/03/24 0523 02/04/24 0254  WBC 8.3 10.8* 7.4  RBC 3.62* 3.73* 3.49*  HGB 10.9* 11.1* 10.4*  HCT 35.0* 35.6* 32.9*  MCV 96.7 95.4 94.3  MCH 30.1 29.8 29.8  MCHC 31.1 31.2 31.6  RDW 14.0 14.0 14.2  PLT 281 248 271   Thyroid No results for input(s): "TSH", "FREET4" in the last  168 hours.  BNP Recent Labs  Lab 02/02/24 1816  BNP 273.5*    DDimer No results for input(s): "DDIMER" in the last 168 hours.   Radiology/Studies:  CT Chest Wo Contrast Result Date: 02/02/2024 CLINICAL DATA:  Cough, shortness of breath, pneumonia EXAM: CT CHEST WITHOUT CONTRAST TECHNIQUE: Multidetector CT imaging of the chest was performed following the standard protocol without IV contrast. RADIATION DOSE REDUCTION: This exam was performed according to the departmental dose-optimization program which includes automated exposure control, adjustment of the mA and/or kV according to patient size and/or use of iterative reconstruction  technique. COMPARISON:  Chest radiograph dated 02/02/2024 FINDINGS: Severely motion degraded imaging. Cardiovascular: Mild cardiomegaly.  No pericardial effusion. No evidence of thoracic aortic aneurysm. Mild atherosclerotic calcifications of the aortic arch. Mediastinum/Nodes: No suspicious mediastinal lymphadenopathy. Lungs/Pleura: Severe respiratory motion. Multifocal patchy/ground-glass opacities, right lung predominant. While poorly evaluated, this favors interstitial edema over multifocal pneumonia. Small right and trace left pleural effusions. No pneumothorax. Upper Abdomen: Visualized upper abdomen is grossly unremarkable. Musculoskeletal: Visualized osseous structures are within normal limits. IMPRESSION: Severely motion degraded imaging. Cardiomegaly with suspected interstitial edema, right lung predominant. Small bilateral pleural effusions. Aortic Atherosclerosis (ICD10-I70.0). Electronically Signed   By: Charline Bills M.D.   On: 02/02/2024 23:08   DG Chest 2 View Result Date: 02/02/2024 CLINICAL DATA:  Cough, hypoxia. EXAM: CHEST - 2 VIEW COMPARISON:  Radiographs 08/27/2021 and 10/31/2020.  CT 04/02/2005. FINDINGS: Progressive cardiac enlargement with poor definition of the pulmonary vasculature and increased diffuse interstitial prominence, suspicious for pulmonary edema. No confluent airspace disease, significant pleural effusion or pneumothorax. The bones appear unchanged. IMPRESSION: Progressive cardiac enlargement with suspected pulmonary edema, suspicious for congestive heart. No confluent airspace disease or pleural effusion. Electronically Signed   By: Carey Bullocks M.D.   On: 02/02/2024 15:07     Assessment and Plan:   Acute on Chronic HFrEf, Ef 30-35%  -Presented to ED on 02/02/2024 for SOB associated with nonproductive cough and orthopnea - BNP 273, procalcitonin 0.45 WNL - CXR: Progressive cardiac enlargement with suspected pulmonary edema, suspicious for congestive heart  failure.  No confluent airspace, disease or pleural effusion. - CT chest 02/02/24: Cardiomegaly with suspected interstitial edema, right lung predominant.  Small bilateral pleural effusion.  Mild aortic atherosclerosis - Echo 02/03/24: LVEF 30 to 35%.  LV moderately decreased function.  LV global hypokinesis left ventricle mildly dilated.  G2DD.  TR unable to be assessed. LA moderately dilated.  Trivial MVR.  Aortic dilation with ascending aorta measuring 37 mm.  RAP of 8 mmHg. (Previous TEE 11/15/2019: LVEF 35-40%) - Currently on diuresis with IV Lasix, will continue.  - this am, she reported improvement with SOB and LE edema.  - Net I/O -923 but suspect not properly documented. She reported good urine output.  - Continue to monitor weight and I's and O's, renal function and electrolytes - PTA includes hydralazine and isosorbide. Currently being held due to soft BP and continuing diuresis. Can consider restarting this or starting spironolactone once euvolemic.  - Currently on Coreg 6.25 BID, will continue.  - Of note, unable to tolerate high-dose beta-blocker in the past due to bradycardia - No ACE/ARB due to angioedema with lisinopril => using hydralazine/nitrates instead for afterload reduction - With GDMT limitations, considering SGLT2i in the setting of DM2. Patient reported UTI history. If SGLT2 added, will be diligent in monitoring for UTIS. ==> Perhaps even more beneficial would be a GLP-1 agonist which would also help for weight loss and  diabetes. - Suspect hospitalist will request TEE in setting of positive Staphylococcus unless considered to be contaminant - Etiology of cardiomyopathy unclear with hx of CVA. Consider ischemic evaluations once euvolemic.   Elevated Troponin related to CHF Prolonged QT  - EKG: NSR, HR 82, RAD, borderline Prolonged QTC ~500, baseline artifact with non specific ST changes - hsTN flat baseline 43>48  - Denies any chest pain - Troponin elevation consistent with  demand ischemia. Not concerned for ACS at this time. However, no ischemic evaluation in the past.  Can consider ischemic evaluation after euvolemic given reduced EF.  - Avoid QT prolongations meds  DM2 - A1c 7.3 - Considering SGLT2i, see above.   CXD 3A - CR 1.27 >1.32> 1.49 , EGFR 50> 48> 41 (stable baseline) - Has not seen nephrology for evaluation but has upcoming appointment.   OSA  -Continue CPAP nightly  CVA  -  Per care everywhere prior this admission, she was on ASA 325 mg. Not received during this admission. Will restart ASA and decrease dose to 81 mg.  Otherwise managed by primary - Possible Pneumonia - Morbid obesity BMI 51 - Bipolar disorder - Seizure disorder  Risk Assessment/Risk Scores:  { New York Heart Association (NYHA) Functional Class NYHA Class IV      For questions or updates, please contact [facility] Please consult [email or web address] for contact info under    Signed, Basilio Cairo, PA-C  02/04/2024 8:45 AM   ATTENDING ATTESTATION  I have seen, examined and evaluated the patient this evening in consultation along with Brandi Gomez, [place].  After reviewing all the available data and chart, we discussed the patients laboratory, study & physical findings as well as symptoms in detail.  I agree with her findings, examination as well as impression recommendations as per our discussion.    Attending adjustments noted in italics.  . Patient is a morbidly obese female with history of chronic combined CHF, HTN, HLD and DM-2 as well as TID 3, OSA and prior CVA admitted for what appears to be exacerbation of combined systolic diastolic failure (acute on chronic) she is feeling better already after diuresis.  We made recommendations as to some adjustments of BP medications as she has been intolerant of higher doses of beta-blocker due to bradycardia and ACE I/ARB/ARNI contraindicated with ACE inhibitor induced angioedema. Currently BP is little low, but  can probably get back on some of her medications. I agree with continued IV diuretic pacing.  To be volume overloaded.  Would continue to monitor to ensure that renal function does not worsen.  Would eventually benefit from ischemic evaluation since this has not been Dr. Lenell Antu does not need to be done in the acute setting.  Minimal troponin elevation is not related to ACS.  Agree with recommendations noted above.     Marykay Lex, MD, MS Bryan Lemma, M.D., M.S. Interventional Cardiologist  [facility]  Pager # [phone] Phone # [phone] [address] [place], [place] [address]  Bryan Lemma, MD  02/04/2024 8:57 PM

## 2024-02-04 NOTE — Progress Notes (Signed)
 Heart Failure Nurse Navigator Progress Note  PCP: Loura Back, NP PCP-Cardiologist: [place] Admission Diagnosis: Acute on chronic congestive heart failure, Acute on chronic respiratory failure with hypoxia, , Pneumonia of RLL.  Admitted from: Urgent Care via [facility]   Presentation:   [name] presented with chest congestion, chest pressure, shortness of breath, and cough. BP 156/107, HR 107, BNP 273, BMI 51, CXR with cardiac enlargement and pulmonary edema, May consider ischemic eval after patient is euvolemic.   Patient was educated on the sign and symptoms of heart failure, daily weights, when to call her doctor or go to the ED, Navigator brought patient a scale to bedside for home use, Education completed on Diet/ fluid restrictions ( patient reports to mainly only drinking Pepsi zero daily and no water, and does use some salt. She reports to taking all her medications as prescribed and that her daughter prepares them for her daily, Patient verbalized her understanding of all education, a HF TOC appointment was scheduled for 02/16/2024 11 am.   ECHO/ LVEF: 30-35%  Clinical Course:  Past Medical History:  Diagnosis Date   Anemia    Anxiety    Bipolar 1 disorder (HCC)    Common migraine 05/19/2015   Depression    Diabetes mellitus, type II (HCC)    Hypertension    Irritable bowel syndrome (IBS)    Mild mental retardation    Obesity    Partial complex seizure disorder with intractable epilepsy (HCC) 05/12/2014   Seizures (HCC)    intractable, sz 08/23/17   Sleep apnea    Stroke (HCC)    Type II or unspecified type diabetes mellitus without mention of complication, not stated as uncontrolled      Social History   Socioeconomic History   Marital status: Single    Spouse name: [name]   Number of children: 3   Years of education: 12   Highest education level: Not on file  Occupational History   Occupation: disabled    Employer: DISABLED  Tobacco Use   Smoking status:  Never   Smokeless tobacco: Never  Vaping Use   Vaping status: Never Used  Substance and Sexual Activity   Alcohol use: No   Drug use: No   Sexual activity: Yes  Other Topics Concern   Not on file  Social History Narrative   Patient lives at home with daughter.    Patient has 3 children.    Patient is right handed.    Patient has a high school education.    Patient is on disability   Patient drinks 2 cups of caffeine daily.   Social Drivers of Corporate investment banker Strain: Not on file  Food Insecurity: No Food Insecurity (02/03/2024)   Hunger Vital Sign    Worried About Running Out of Food in the Last Year: Never true    Ran Out of Food in the Last Year: Never true  Transportation Needs: No Transportation Needs (02/03/2024)   PRAPARE - Administrator, Civil Service (Medical): No    Lack of Transportation (Non-Medical): No  Physical Activity: Not on file  Stress: Not on file  Social Connections: Not on file   Education Assessment and Provision:  Detailed education and instructions provided on heart failure disease management including the following:  Signs and symptoms of Heart Failure When to call the physician Importance of daily weights Low sodium diet Fluid restriction Medication management Anticipated future follow-up appointments  Patient education given on each  of the above topics.  Patient acknowledges understanding via teach back method and acceptance of all instructions.  Education Materials:  "Living Better With Heart Failure" Booklet, HF zone tool, & Daily Weight Tracker Tool.  Patient has scale at home: No, Navigator brought one to bedside.  Patient has pill box at home: NA    High Risk Criteria for Readmission and/or Poor Patient Outcomes: Heart failure hospital admissions (last 6 months): 1  No Show rate: 9 %  Difficult social situation: No, lives with her daughter Demonstrates medication adherence: yes, daughter prepares her  medications for her daily.  Primary Language: English Literacy level: Reading, writing,and comprehension  Barriers of Care:   Diet/ fluid restrictions Daily weights   Considerations/Referrals:   Referral made to Heart Failure Pharmacist Stewardship: yes Referral made to Heart Failure CSW/NCM TOC: No Referral made to Heart & Vascular TOC clinic: Yes, 02/16/2024 @ 11 am.   Items for Follow-up on DC/TOC: Continued HF education Diet/ fluid restrictions ( soda/ salt) Daily weights ( gave her a scale in hospital)    Rhae Hammock, BSN, RN Heart Failure Teacher, adult education Only

## 2024-02-04 NOTE — Progress Notes (Signed)
 [facility] PROGRESS NOTE  Brandi Gomez (DOB: 02/17/1971) ZHY:[id number] PCP: Loura Back, NP  Brief Narrative: Brandi Gomez is a 53 y.o. female with medical history significant for hypertension, type 2 diabetes mellitus, BMI 53, bipolar disorder, seizures, OSA, and chronic HFrEF who presents with shortness of breath.   Patient reports that she began developing increased dyspnea 1 week ago and has progressively worsened.  She has had a nonproductive cough associated with this.  She denies any chest pain, has not noticed any change in her chronic bilateral leg swelling, and reports orthopnea but notes that it is chronic.  She has not documented any fevers recently.  She was taking Mucinex and cough drops at home but continued to worsen, sought evaluation at an urgent care today, but was found to be hypoxic and was directed to the ED.   ED Course: Upon arrival to the ED, patient is found to be afebrile and saturating well on 50 L/min via heated high flow nasal cannula with tachypnea, mild tachycardia, and elevated blood pressure.  Labs are most notable for creatinine 1.27, normal WBC, normal lactic acid, troponin 43, BNP 274, and negative COVID and influenza PCR.  Chest x-ray concerning for progressive cardiomegaly with suspected pulmonary edema.  CT chest is severely motion degraded but reveals cardiomegaly with suspected interstitial edema and small bilateral pleural effusions.   Blood cultures were collected in the ED and the patient was treated with Lasix, Zofran, acetaminophen, DuoNeb, Rocephin, and azithromycin.  Subjective: Breathing better, feels she still gets winded with exertion. No chest pain.   Objective: BP (!) 116/55 (BP Location: Left Wrist)   Pulse 70   Temp 97.9 F (36.6 C) (Oral)   Resp 20   Ht 5\' 3"  (1.6 m)   Wt 130.6 kg   LMP  (LMP Unknown)   SpO2 97%   BMI 51.00 kg/m   Gen: Obese female in no acute distress Pulm: Crackles at bases, no wheezes,  nonlabored on supplemental oxygen  CV: RRR, no MRG, + LE edema, equivocal JVD GI: Soft, NT, ND, +BS Neuro: Alert and oriented. No new focal deficits. Ext: Warm, no deformities. Skin: No open wounds on visualized skin   Assessment & Plan: Acute on chronic HFrEF; acute hypoxic respiratory failure  - LVEF slightly down, GDMT limited w/mild CKD, hx angioedema to lisinopril, appreciate cardiology consultation.  - Will continue lasix 40mg  IV BID for now, monitor I/O, weights - Continue coreg.  - Monitor   S. hominis blood culture contaminant: Afebrile, note transient leukocytosis that has resolved. PCT 0.45. Only 1 of 2 cultures resulting positive. Note CT chest showed patchy opacities as well as diffuse interstitial prominence and effusions, suggestive of pulmonary edema favored over multifocal pneumonia. Continue to monitor off abx.   Demand myocardial ischemia: Quite mild w/flat troponins in 40's, no angina, no ST segment deviations on ECG. No regional WMA on echo.  - Defer further work up to cardiology.     NIDT2DM: HbA1c is 7.3%.  - Continue SSI    Bipolar disorder and seizure disorder: Quiescent.  - Continue abilify, hydroxyzine, lamictal, clobazam     Stage IIIa CKD:  - Cr slowly rising, continue diuresis as tolerated.  - Avoid nephrotoxins.    OSA  - Continue CPAP qHS.  Class III obesity: Body mass index is 51 kg/m.   Vitamin B12 deficiency:  - Start IM supplementation here, convert to po at discharge.   HLD:  - Continue statin   Fulton Reek  Jarvis Newcomer, MD [facility] [email or web address] 02/04/2024, 6:11 PM

## 2024-02-04 NOTE — TOC Initial Note (Signed)
 Transition of Care [facility]) - Initial/Assessment Note    Patient Details  Name: Brandi Gomez MRN: [id number] Date of Birth: 1971/07/08  Transition of Care [facility]) CM/SW Contact:    Leone Haven, RN Phone Number: 02/04/2024, 3:48 PM  Clinical Narrative:                 From home with daughter, has PCP  [facility]. Health and insurance on file, states has no HH services in place at this time, has cpap at home.  States froemd. [facility], [facility] will transport them home at dc and family is support system, [facility] [place]  Pta self ambulatory.  Expected Discharge Plan: Home/Self Care Barriers to Discharge: Continued Medical Work up   Patient Goals and CMS Choice Patient states their goals for this hospitalization and ongoing recovery are:: return home   Choice offered to / list presented to : NA      Expected Discharge Plan and Services   Discharge Planning Services: CM Consult Post Acute Care Choice: NA Living arrangements for the past 2 months: Single Family Home                 DME Arranged: N/A DME Agency: NA       HH Arranged: NA          Prior Living Arrangements/Services Living arrangements for the past 2 months: Single Family Home Lives with:: Adult Children (daughter) Patient language and need for interpreter reviewed:: Yes Do you feel safe going back to the place where you live?: Yes      Need for Family Participation in Patient Care: Yes (Comment) Care giver support system in place?: Yes (comment) Current home services: DME (cpap machine) Criminal Activity/Legal Involvement Pertinent to Current Situation/Hospitalization: No - Comment as needed  Activities of Daily Living   ADL Screening (condition at time of admission) Independently performs ADLs?: Yes (appropriate for developmental age) Is the patient deaf or have difficulty hearing?: Yes Does the patient have difficulty seeing, even when wearing glasses/contacts?:  Yes Does the patient have difficulty concentrating, remembering, or making decisions?: Yes  Permission Sought/Granted Permission sought to share information with : Case Manager Permission granted to share information with : Yes, Verbal Permission Granted              Emotional Assessment Appearance:: Appears stated age Attitude/Demeanor/Rapport: Engaged Affect (typically observed): Appropriate Orientation: : Oriented to Self, Oriented to Place, Oriented to  Time, Oriented to Situation   Psych Involvement: No (comment)  Admission diagnosis:  Acute respiratory failure with hypoxia (HCC) [J96.01] Acute on chronic respiratory failure with hypoxia (HCC) [J96.21] Pneumonia of right upper lobe due to infectious organism [J18.9] Acute on chronic congestive heart failure, unspecified heart failure type (HCC) [I50.9] Patient Active Problem List   Diagnosis Date Noted   Elevated troponin 02/03/2024   Acute respiratory failure with hypoxia (HCC) 02/02/2024   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, unspecified cerebrovascular disease type (HCC) 01/18/2021   Morbid (severe) obesity due to excess calories (HCC) 04/18/2020   HLD (hyperlipidemia) 10/12/2019   CKD (chronic kidney disease) stage 3, GFR 30-59 ml/min (HCC) 10/12/2019   Acute on chronic HFrEF (heart failure with reduced ejection fraction) (HCC)    Depression 07/14/2019   Bipolar disorder (HCC) 05/13/2019   Bipolar 1 disorder (HCC) 05/13/2019   Adjustment disorder with anxiety    History of recent stroke 08/06/2017   OSA (obstructive sleep apnea)  Controlled type 2 diabetes mellitus without complication, without long-term current use of insulin (HCC) 05/08/2017   Hypertension associated with diabetes (HCC) 07/27/2008   Seizure disorder (HCC) 02/05/2007   PCP:  Loura Back, NP [facility]acy[facility]   [phone] [place], [place] - [address] [facility] [address] [place] [address] Phone: [phone] Fax: [phone]  [facility] [address] [place] [address] Phone: [phone] Fax: [phone]     Social Drivers of Health (SDOH) Social History: SDOH Screenings   Food Insecurity: No Food Insecurity (02/03/2024)  Housing: Low Risk  (02/04/2024)  Transportation Needs: No Transportation Needs (02/03/2024)  Utilities: Not At Risk (02/03/2024)  Alcohol Screen: Low Risk  (02/04/2024)  Depression (PHQ2-9): Medium Risk (01/18/2021)  Financial Resource Strain: Low Risk  (02/04/2024)  Tobacco Use: Low Risk  (02/03/2024)   SDOH Interventions: Housing Interventions: Intervention Not Indicated Alcohol Usage Interventions: Intervention Not Indicated (Score <7) Financial Strain Interventions: Intervention Not Indicated   Readmission Risk Interventions    02/04/2024    3:47 PM  Readmission Risk Prevention Plan  Post Dischage Appt Complete  Medication Screening Complete  Transportation Screening Complete

## 2024-02-05 ENCOUNTER — Telehealth (HOSPITAL_COMMUNITY): Payer: Self-pay | Admitting: Pharmacy Technician

## 2024-02-05 ENCOUNTER — Other Ambulatory Visit (HOSPITAL_COMMUNITY): Payer: Self-pay

## 2024-02-05 DIAGNOSIS — I5023 Acute on chronic systolic (congestive) heart failure: Secondary | ICD-10-CM | POA: Diagnosis not present

## 2024-02-05 DIAGNOSIS — J9601 Acute respiratory failure with hypoxia: Secondary | ICD-10-CM | POA: Diagnosis not present

## 2024-02-05 DIAGNOSIS — R7989 Other specified abnormal findings of blood chemistry: Secondary | ICD-10-CM | POA: Diagnosis not present

## 2024-02-05 LAB — GLUCOSE, CAPILLARY
Glucose-Capillary: 142 mg/dL — ABNORMAL HIGH (ref 70–99)
Glucose-Capillary: 155 mg/dL — ABNORMAL HIGH (ref 70–99)
Glucose-Capillary: 139 mg/dL — ABNORMAL HIGH (ref 70–99)
Glucose-Capillary: 164 mg/dL — ABNORMAL HIGH (ref 70–99)

## 2024-02-05 LAB — BASIC METABOLIC PANEL
Anion gap: 13 (ref 5–15)
BUN: 12 mg/dL (ref 6–20)
CO2: 28 mmol/L (ref 22–32)
Calcium: 8.7 mg/dL — ABNORMAL LOW (ref 8.9–10.3)
Chloride: 96 mmol/L — ABNORMAL LOW (ref 98–111)
Creatinine, Ser: 1.44 mg/dL — ABNORMAL HIGH (ref 0.44–1.00)
GFR, Estimated: 43 mL/min — ABNORMAL LOW (ref >60)
Glucose, Bld: 142 mg/dL — ABNORMAL HIGH (ref 70–99)
Potassium: 3.5 mmol/L (ref 3.5–5.1)
Sodium: 137 mmol/L (ref 135–145)

## 2024-02-05 MED ORDER — SPIRONOLACTONE 12.5 MG HALF TABLET
12.5000 mg | ORAL_TABLET | Freq: Every day | ORAL | Status: DC
  Administered 2024-02-05 – 2024-02-08 (×4): 12.5 mg via ORAL
  Filled 2024-02-05 (×4): qty 1

## 2024-02-05 MED ORDER — POTASSIUM CHLORIDE CRYS ER 20 MEQ PO TBCR
40.0000 meq | EXTENDED_RELEASE_TABLET | ORAL | Status: AC
  Administered 2024-02-05 (×2): 40 meq via ORAL
  Filled 2024-02-05 (×2): qty 2

## 2024-02-05 MED ORDER — FUROSEMIDE 10 MG/ML IJ SOLN
60.0000 mg | Freq: Two times a day (BID) | INTRAMUSCULAR | Status: DC
  Administered 2024-02-05 – 2024-02-06 (×3): 60 mg via INTRAVENOUS
  Filled 2024-02-05 (×3): qty 6

## 2024-02-05 MED ORDER — ISOSORB DINITRATE-HYDRALAZINE 20-37.5 MG PO TABS
0.5000 | ORAL_TABLET | Freq: Three times a day (TID) | ORAL | Status: DC
  Administered 2024-02-06 – 2024-02-08 (×7): 0.5 via ORAL
  Filled 2024-02-05 (×7): qty 1

## 2024-02-05 MED ORDER — ISOSORB DINITRATE-HYDRALAZINE 20-37.5 MG PO TABS: 1.0000 | ORAL_TABLET | Freq: Three times a day (TID) | ORAL | Status: DC

## 2024-02-05 NOTE — Progress Notes (Signed)
 Nurse requested Mobility Specialist to perform oxygen saturation test with pt which includes removing pt from oxygen both at rest and while ambulating.  Below are the results from that testing.     Patient Saturations on Room Air at Rest = spO2 94%  Patient Saturations on Room Air while Ambulating = sp02 86% .    Patient Saturations on 2 Liters of oxygen while Ambulating = sp02 92%  At end of testing pt left in room on RA.  Reported results to nurse.   Addison Lank Mobility Specialist Please contact via SecureChat or  Rehab office at [phone]

## 2024-02-05 NOTE — Progress Notes (Signed)
 Heart Failure Stewardship Pharmacist Progress Note   PCP: Loura Back, NP PCP-Cardiologist: Chilton Si, MD   HPI:  Brandi Gomez is a 41 YOF with PMH of HTN, T2DM, CVA, obesity, bipolar disorder, seizures, OSA, and HFrEF.  She presented to to [facility] ED on 02/02/24 with primary complaint of SOB. The patient reported increased DOE for the past week. She has had a nonproductive cough as well. She denied any chest pain or palpitations. She reports no changes to her already present LEE. Vitals upon arrival was afebrile, SpO2 96% on HFNC 5 L of O2, BP 146/102 mmHg, and HR 71 bpm. Labs obtained WBC 8.3, Scr 1.27 (BL ~1.1), BUN 11, K 4.2, Na 137, flat troponins, BNP 274. CXR showed progressive cardiac enlargement with suspected pulmonary edema. She was given furosemide IV and resumed on carvedilol. ECHO 02/03/24 LVEF 30-35%, LV GHK, RV normal, grade 2 DD.  Today patient is doing well. She is still having SOB and requiring [place] 3 L supplemental oxygen in addition to CPAP overnight. Patient has limited mobility at baseline. Today she is able to sit on the edge of the bed and move to bed-side commode with RN help. She has bilateral 2+ pitting LEE extending to her knees. She denies any dizziness or lightheadedness. No reports of PND and orthopnea. No reports of chest pain or palpitations. Her appetite is OK. Plans for [facility] once patient is closer to euvolemic status; may be done outpatient.   Current HF Medications: Diuretic: furosemide 40 mg IV BID Beta Blocker: carvedilol 6.25 mg BID  Prior to admission HF Medications: Diuretic: torsemide 10 mg daily Beta blocker: carvedilol 6.25 mg BID  Pertinent Lab Values: Serum creatinine 1.44, BUN 12, Potassium 3.5, Sodium 137 02/04/24: Magnesium 1.9 02/03/24: A1c 7.3%  Vital Signs: Weight: 284 lbs (admission weight: 287 lbs) Blood pressure: 120-130/70-80 mmHg Heart rate: 60-70 bpm  I/O: net +0.13 L yesterday; net -0.8 L since admission; three unmeasured  UOP 02/04/24 + one unmeasured UOP 02/05/24  Medication Assistance / Insurance Benefits Check: Does the patient have prescription insurance?  Yes Type of insurance plan: [employer] Medicare Part D  Does the patient qualify for medication assistance through manufacturers or grants?   No; $0 co-pays for brand-name medications  Outpatient Pharmacy:  [facility] admission [facility] [place] Is the patient willing to use [facility] TOC pharmacy at discharge? Yes Is the patient willing to transition their outpatient pharmacy to utilize a [facility] outpatient pharmacy?   No    Assessment: 1. Acute on chronic systolic + diastolic CHF (LVEF 30-35%), ischemic work-up pending. NYHA class II symptoms. - Patient remains hypervolemic with continued SOB requiring [place] 3L of supplemental oxygen and bilateral 2+ pitting LEE. Her reported I/Os are inaccurate with 4x unmeasured UOP this admission- increase current diuresis - Patient normal BP and AKI with mildly elevated Scr - continue low-dose BB; consider starting MRA today; OK to start low dose Bidil tomorrow, look to hold if BP decreases overnight - Patient has history of angioedema with lisinopril - do not start RAASi - Patient has limited mobility at baseline and at home, with SGLT2i therapy this would predispose the patient to genitourinary tract infections - would not start SGLT2i - Serum potassium is low at 3.5 mmol/L - requires replacement   Plan: 1) Medication changes recommended at this time: - Increase furosemide to 60 mg IV BID - Start spironolactone 12.5 mg daily  - Tomorrow, OK to start Bidil 0.5 tab  TID; monitor BP closely, could consider increasing spironolactone tomorrow instead if TIC compliance is a concern - Give potassium chloride 40 mEq PO x2   2) Patient assistance: - Patient would like first fills of medications at [facility] [address]  3)  Education  - Initial education completed  02/05/24 - Final education to be completed prior to discharge  Wilmer Floor, PharmD PGY2 Cardiology Pharmacy Resident Heart Failure Stewardship Phone [phone]

## 2024-02-05 NOTE — Evaluation (Signed)
 Physical Therapy Evaluation Patient Details Name: Brandi Gomez MRN: [id number] DOB: Feb 08, 1971 Today's Date: 02/05/2024  History of Present Illness  Pt is a 53 y.o. female who presented 02/02/24 with SOB. Admitted with acute on chronic HFrEF. PMH: HTN, DM2, bipolar disorder, seizures, OSA, chronic HFrEF, CVA   Clinical Impression  Pt presents with condition above and deficits mentioned below, see PT Problem List. PTA, she was independent without AD, living with her daughter (who has intellectual disabilities) in a 1-level house with 5 STE. Currently, pt demonstrates deficits in gross strength (reports hx of L weakness from prior CVA, but seems fairly symmetrical), activity tolerance/endurance, and balance. She reports a hx of memory and cognitive deficits from her prior CVA as well. At this time, the pt is able to perform all functional mobility without LOB with only CGA for safety. She did demonstrate improved balance and activity tolerance when using the RW vs no UE support. Thus, recommending a bariatric rollator for energy conservation and balance at d/c. Pt also reports a hx of inability to get to her bathroom in time and is requesting a bariatric bedside commode. She could benefit from Cardiac Rehab at d/c. If she cannot get cardiac rehab then recommend HHPT. Will continue to follow acutely.      If plan is discharge home, recommend the following: Assistance with cooking/housework;Assist for transportation;Help with stairs or ramp for entrance;Supervision due to cognitive status;Direct supervision/assist for medications management;Direct supervision/assist for financial management   Can travel by private vehicle        Equipment Recommendations Rollator (4 wheels);BSC/3in1 (bariatric for both)  Recommendations for Other Services       Functional Status Assessment Patient has had a recent decline in their functional status and demonstrates the ability to make significant improvements  in function in a reasonable and predictable amount of time.     Precautions / Restrictions Precautions Precautions: Fall Recall of Precautions/Restrictions: Intact Precaution/Restrictions Comments: Watch SpO2 Restrictions Weight Bearing Restrictions Per Provider Order: No      Mobility  Bed Mobility Overal bed mobility: Modified Independent             General bed mobility comments: Pt able to transition supine to sit L EOB without assistance, HOB elevated    Transfers Overall transfer level: Needs assistance Equipment used: Rolling walker (2 wheels) Transfers: Sit to/from Stand Sit to Stand: Contact guard assist           General transfer comment: Pt pulled up on the RW to stand from EOB. Cues provided for reaching back to the chair prior to sitting. CGA for safety    Ambulation/Gait Ambulation/Gait assistance: Contact guard assist Gait Distance (Feet): 230 Feet Assistive device: Rolling walker (2 wheels), None Gait Pattern/deviations: Step-through pattern, Decreased stride length, Trunk flexed Gait velocity: reduced Gait velocity interpretation: <1.31 ft/sec, indicative of household ambulator   General Gait Details: Pt ambulated the first ~150 ft with the RW, needing cues to stand upright and remain proximal to the RW. She ambulated the final distance without UE support. No LOB, but pt fatigues quickly and displayed mildly increased sway without UE support, CGA for safety  Stairs            Wheelchair Mobility     Tilt Bed    Modified Rankin (Stroke Patients Only)       Balance Overall balance assessment: Needs assistance Sitting-balance support: No upper extremity supported, Feet supported Sitting balance-Leahy Scale: Good     Standing balance support:  During functional activity, Reliant on assistive device for balance, Bilateral upper extremity supported, No upper extremity supported Standing balance-Leahy Scale: Fair Standing balance  comment: Imbalance noted without UE support, but able to stand and take steps without LOB                             Pertinent Vitals/Pain Pain Assessment Pain Assessment: No/denies pain    Home Living Family/patient expects to be discharged to:: Private residence Living Arrangements: Children (daughter with intellectual disabilities) Available Help at Discharge: Family;Available PRN/intermittently (daughter at home has intellectual disabilites, other family works) Type of Home: House Home Access: Stairs to enter Entrance Stairs-Rails: Acupuncturist of Steps: 5   Home Layout: One level Home Equipment: BSC/3in1 (BSC is too narrow)      Prior Function Prior Level of Function : Needs assist             Mobility Comments: Independent without AD ADLs Comments: friend does cooking, cleaning, and driving; pt washes and bathes herself     Extremity/Trunk Assessment   Upper Extremity Assessment Upper Extremity Assessment: Defer to OT evaluation    Lower Extremity Assessment Lower Extremity Assessment: LLE deficits/detail;Generalized weakness (denied numbness/tingling bil) LLE Deficits / Details: reports hx of L sided weakness from prior CVA, but appears fairly symmetrical with MMT scores of 4+ grossly LLE Sensation: WNL    Cervical / Trunk Assessment Cervical / Trunk Assessment: Kyphotic (mild)  Communication   Communication Communication: Impaired Factors Affecting Communication: Reduced clarity of speech    Cognition Arousal: Alert Behavior During Therapy: WFL for tasks assessed/performed   PT - Cognitive impairments: History of cognitive impairments                       PT - Cognition Comments: reports hx of memory deficits since prior CVAs Following commands: Intact       Cueing Cueing Techniques: Verbal cues, Tactile cues     General Comments General comments (skin integrity, edema, etc.): SpO2 90% on RA at rest, as low  as 85% on RA when ambulating, >/= 92% on 1L when ambulating, as low as 79% on 1L sitting after ambulating with pt needing 3L to recover    Exercises     Assessment/Plan    PT Assessment Patient needs continued PT services  PT Problem List Decreased strength;Decreased activity tolerance;Decreased balance;Decreased mobility;Decreased cognition;Cardiopulmonary status limiting activity       PT Treatment Interventions DME instruction;Gait training;Stair training;Functional mobility training;Therapeutic activities;Therapeutic exercise;Balance training;Neuromuscular re-education;Patient/family education;Cognitive remediation    PT Goals (Current goals can be found in the Care Plan section)  Acute Rehab PT Goals Patient Stated Goal: to improve her walking PT Goal Formulation: With patient Time For Goal Achievement: 02/19/24 Potential to Achieve Goals: Good    Frequency Min 1X/week     Co-evaluation               AM-PAC PT "6 Clicks" Mobility  Outcome Measure Help needed turning from your back to your side while in a flat bed without using bedrails?: None Help needed moving from lying on your back to sitting on the side of a flat bed without using bedrails?: None Help needed moving to and from a bed to a chair (including a wheelchair)?: A Little Help needed standing up from a chair using your arms (e.g., wheelchair or bedside chair)?: A Little Help needed to walk in hospital room?: A  Little Help needed climbing 3-5 steps with a railing? : A Little 6 Click Score: 20    End of Session Equipment Utilized During Treatment: Gait belt;Oxygen Activity Tolerance: Patient tolerated treatment well Patient left: in chair;with call bell/phone within reach;with chair alarm set   PT Visit Diagnosis: Unsteadiness on feet (R26.81);Other abnormalities of gait and mobility (R26.89);Muscle weakness (generalized) (M62.81)    Time: 9604-5409 PT Time Calculation (min) (ACUTE ONLY): 20  min   Charges:   PT Evaluation $PT Eval Moderate Complexity: 1 Mod   PT General Charges $$ ACUTE PT VISIT: 1 Visit         Virgil Benedict, PT, DPT Acute Rehabilitation Services  Office: [phone]   Brandi Gomez 02/05/2024, 4:07 PM

## 2024-02-05 NOTE — Progress Notes (Addendum)
 Patient Name: Brandi Gomez Date of Encounter: 02/05/2024 [facility] Cardiologist: Chilton Si, MD   Interval Summary  .    Breathing has improved, remains on [place] @2L . Sitting up on the side of the bed.   Vital Signs .    Vitals:   02/05/24 0030 02/05/24 0047 02/05/24 0815 02/05/24 0820  BP:  136/74 131/80 131/80  Pulse:  68 79 81  Resp: (!) 27 18 (!) 23 20  Temp:  97.6 F (36.4 C) 97.8 F (36.6 C) 99 F (37.2 C)  TempSrc:  Oral Oral Oral  SpO2:   (!) 86% 94%  Weight:      Height:        Intake/Output Summary (Last 24 hours) at 02/05/2024 0925 Last data filed at 02/05/2024 0820 Gross per 24 hour  Intake 716 ml  Output 350 ml  Net 366 ml      02/04/2024    4:46 AM 02/03/2024    2:18 PM 01/14/2024    1:56 PM  Last 3 Weights  Weight (lbs) 287 lb 14.4 oz 287 lb 14.7 oz 297 lb 9.6 oz  Weight (kg) 130.591 kg 130.6 kg 134.99 kg      Telemetry/ECG    Sinus Rhythm - Personally Reviewed  Physical Exam .    GEN: Sitting up on the side of the bed, remains on [place]@2L   Neck: Difficult to JVD 2/2 neck girth Cardiac: distant heart sounds, RRR, no murmurs, rubs, or gallops.  Respiratory: Diminished in bases GI: Soft, nontender, non-distended  MS: No edema  Assessment & Plan .     53 y.o. female with a hx of chronic systolic and diastolic HF, HTN, HLD, DM2, morbid obesity, CKD 3, OSA on CPAP, bipolar, anxiety, epilepsy and CVA who is being seen 02/04/2024 for the evaluation of heart failure at the request of Dr. Jarvis Newcomer.   Acute on Chronic HFrEF  -- Etiology unknown as it does not appear she has had an ischemic evaluation   Echo 02/03/2024 LVEF 30-35%, global hypokinesis, grade 2 diastolic dysfunction, normal RV, mildly dilated left atria -- BNP 273, CT chest with interstitial edema, right lung predominant, bilateral small pleural effusions Unfortunately, the exam is very unhelpful for treating volume status.  Would simply monitor renal function and BUN as  markers of volume status. -- Minimal urine output on IV Lasix 40 mg twice daily, have increased to IV Lasix 60 mg twice daily, - 557cc. Difficult to assess volume status given body habitus, though remains on [place]. Would continue diuretics today -- GDMT: Coreg 6.25 mg twice daily, spironolactone 12.5 mg daily.  Unable to use ACE/ARB secondary to history of angioedema with lisinopril. Add BiDil 0.5mg  TID tomorrow and follow BP trend Reasonable to continue consolidate hydralazine/nitrate to BiDil for more effective afterload reduction as opposed to hydralazine with Imdur --> provided cost assessment is reasonable for outpatient management.Marland Kitchen  Spironolactone added for additional heart failure management with diuresis/BP control and afterload reduction.  Once she is more euvolemic, can consider titrating carvedilol dose to 12.5 mg daily. -- consider GLP1 outpatient -- hx of UTIs, therefore would avoid SGLT2 for now -- consider GLP1-a in OP setting   Elevated troponin -- High-sensitivity troponin 43>> 48 in the setting of acute on chronic CHF.  Suspect demand ischemia -- As noted above does not appear that she has ever had an ischemic evaluation to determine etiology of her heart failure.  Could consider this as an outpatient  CKD stage IIIa --  Baseline appears 1.3-1.4, stable at 1.44 today Closely follow renal function as well as BUN in the setting of aggressive diuresis.  HLD -- on statin   Per Primary Bipolar OSA DM  For questions or updates, please contact [facility] Please consult [email or web address] for contact info under     Signed, Laverda Page, NP .   ATTENDING ATTESTATION  I have seen, examined and evaluated the patient this AM on rounds along with Laverda Page, NP-C.  After reviewing all the available data and chart, we discussed the patients laboratory, study & physical findings as well as symptoms in detail.  I agree with her findings, examination as well as  impression recommendations as per our discussion.    Attending adjustments noted in italics.   Slow progression.  Had minimal urine output with lower dose of Lasix now increasing up to 60 mg twice daily.  Also added low-dose spironolactone.  Converting hydralazine plus Imdur.  To more effective it alternative of BiDil.  Continue to monitor.  I think she probably has a another day or so worth of IV diuresis.  May need to consider invasive monitoring to make sure that we have optimized diuresis.    Marykay Lex, MD, MS Bryan Lemma, M.D., M.S. Interventional Cardiologist  [facility]  Pager # [phone] Phone # [phone] [address] [place], [place] [address]

## 2024-02-05 NOTE — Progress Notes (Signed)
 Mobility Specialist Progress Note:   02/05/24 1130  Mobility  Activity Ambulated with assistance in room  Level of Assistance Standby assist, set-up cues, supervision of patient - no hands on  Assistive Device None  Distance Ambulated (ft) 60 ft  Activity Response Tolerated well  Mobility Referral Yes  Mobility visit 1 Mobility  Mobility Specialist Start Time (ACUTE ONLY) 1130  Mobility Specialist Stop Time (ACUTE ONLY) 1145  Mobility Specialist Time Calculation (min) (ACUTE ONLY) 15 min   Pt agreeable to mobility session. Required no physical assistance to ambulate multiple laps in room. Pt maintained SpO2 >90% on RA for a good distance, then desat to 86%. Required 2 LO2 to recover and maintain >88%. Pt left sitting on EOB with all needs met.   Addison Lank Mobility Specialist Please contact via SecureChat or  Rehab office at [phone]

## 2024-02-05 NOTE — Telephone Encounter (Signed)
 Patient Product/process development scientist completed.    The patient is insured through [employer]. Patient has Medicare and is not eligible for a copay card, but may be able to apply for patient assistance or Medicare RX Payment Plan (Patient Must reach out to their plan, if eligible for payment plan), if available.    Ran test claim for isosorbide-hydralazine (Bidil) 20-37.5 mg and the current 30 day co-pay is $0.00.   This test claim was processed through [facility]- copay amounts may vary at other pharmacies due to pharmacy/plan contracts, or as the patient moves through the different stages of their insurance plan.     Roland Earl, [name] Pharmacy Technician III Certified Patient Advocate [facility] Patient Advocate Team Direct Number: [phone]  Fax: [phone]

## 2024-02-05 NOTE — Progress Notes (Signed)
 [facility] PROGRESS NOTE  Brandi Gomez (DOB: [birth year]) EAV:[id number] PCP: Loura Back, NP  Brief Narrative: Brandi Gomez is a 53 y.o. female with medical history significant for hypertension, type 2 diabetes mellitus, BMI 53, bipolar disorder, seizures, OSA, and chronic HFrEF who presents with shortness of breath.   Patient reports that she began developing increased dyspnea 1 week ago and has progressively worsened.  She has had a nonproductive cough associated with this.  She denies any chest pain, has not noticed any change in her chronic bilateral leg swelling, and reports orthopnea but notes that it is chronic.  She has not documented any fevers recently.  She was taking Mucinex and cough drops at home but continued to worsen, sought evaluation at an urgent care today, but was found to be hypoxic and was directed to the ED.   ED Course: Upon arrival to the ED, patient is found to be afebrile and saturating well on 50 L/min via heated high flow nasal cannula with tachypnea, mild tachycardia, and elevated blood pressure.  Labs are most notable for creatinine 1.27, normal WBC, normal lactic acid, troponin 43, BNP 274, and negative COVID and influenza PCR.  Chest x-ray concerning for progressive cardiomegaly with suspected pulmonary edema.  CT chest is severely motion degraded but reveals cardiomegaly with suspected interstitial edema and small bilateral pleural effusions.   Blood cultures were collected in the ED and the patient was treated with Lasix, Zofran, acetaminophen, DuoNeb, Rocephin, and azithromycin.  Subjective: Breathing again slightly better, but had trouble with CPAP last night, still dyspneic with exertion. No chest pain, no cough, no fever.   Objective: BP 119/71 (BP Location: Right Arm)   Pulse 67   Temp 98.2 F (36.8 C) (Oral)   Resp 18   Ht 5\' 3"  (1.6 m)   Wt 129.2 kg   LMP  (LMP Unknown)   SpO2 95%   BMI 50.46 kg/m   Gen: Obese female sitting  EOB in no distress Pulm: Crackles without wheezes, tachypneic but normal effort at rest with supplemental oxygen.  CV: RRR, +LE edema GI: Soft, NT, ND, +BS  Neuro: Alert, interactive, and oriented. No new focal deficits. Ext: Warm, no deformities Skin: No open wounds on visualized skin    Assessment & Plan: Acute on chronic combined HFrEF: LVEF 30-35%, global hypokinesis, G2DD, RVSF ok.  - GDMT limited w/mild CKD, hx angioedema to lisinopril, changing to hydral/isosorbide, add spironolactone, continue coreg.  - Appreciate cardiology consultation, augment diuresis, consider RHC.  - Continue monitoring I/O, daily weights  Acute hypoxic respiratory failure: Due to CHF as above.  - Ambulatory pulse oximetry repeated today. Remains exertionally hypoxemic but not hypoxemic at rest (improving).  S. hominis blood culture contaminant: Afebrile, note transient leukocytosis that has resolved. PCT 0.45. Only 1 of 2 cultures resulting positive. Note CT chest showed patchy opacities as well as diffuse interstitial prominence and effusions, suggestive of pulmonary edema favored over multifocal pneumonia. Continue to monitor off abx.   Demand myocardial ischemia: Quite mild w/flat troponins in 40's, no angina, no ST segment deviations on ECG. No regional WMA on echo.  - D/w cardiology, likely to defer ischemic evaluation to outpatient setting.   NIDT2DM: HbA1c is 7.3%.  - Continue SSI - Note patient was prescribed mounjaro but hasn't been taking regularly. This would help with obesity as well    Bipolar disorder and seizure disorder: Quiescent.  - Continue abilify, hydroxyzine, lamictal, clobazam     Stage IIIa CKD:  -  Cr stabilizing today, UOP not completely documented, no weight this AM.  - Avoid nephrotoxins.   History of TIA/CVA:  - Continue statin. Pt hasn't been on aspirin since 2021.    OSA  - Continue CPAP qHS.  Class III obesity: Body mass index is 50.46 kg/m.   Vitamin B12  deficiency:  - Started IM supplementation here, convert to po at discharge.   HLD:  - Continue statin   Tyrone Nine, MD [facility] [email or web address] 02/05/2024, 3:06 PM

## 2024-02-05 NOTE — TOC Progression Note (Signed)
 Transition of Care [facility]) - Progression Note    Patient Details  Name: Brandi Gomez MRN: [id number] Date of Birth: 11-30-1971  [facility]) CM/SW Contact  Leone Haven, RN Phone Number: 02/05/2024, 3:51 PM  Clinical Narrative:    Patient may possibly need home oxygen, per MD he is still getting more fluid off so will continue to monitor.    Expected Discharge Plan: Home/Self Care Barriers to Discharge: Continued Medical Work up  Expected Discharge Plan and Services   Discharge Planning Services: CM Consult Post Acute Care Choice: NA Living arrangements for the past 2 months: Single Family Home                 DME Arranged: N/A DME Agency: NA       HH Arranged: NA           Social Determinants of Health (SDOH) Interventions SDOH Screenings   Food Insecurity: No Food Insecurity (02/03/2024)  Housing: Low Risk  (02/04/2024)  Transportation Needs: No Transportation Needs (02/03/2024)  Utilities: Not At Risk (02/03/2024)  Alcohol Screen: Low Risk  (02/04/2024)  Depression (PHQ2-9): Medium Risk (01/18/2021)  Financial Resource Strain: Low Risk  (02/04/2024)  Tobacco Use: Low Risk  (02/03/2024)    Readmission Risk Interventions    02/04/2024    3:47 PM  Readmission Risk Prevention Plan  Post Dischage Appt Complete  Medication Screening Complete  Transportation Screening Complete

## 2024-02-05 NOTE — Progress Notes (Signed)
   02/05/24 0030  BiPAP/CPAP/SIPAP  $ Non-Invasive Home Ventilator  Subsequent  $ Face Mask Medium Yes  BiPAP/CPAP/SIPAP Pt Type Adult  BiPAP/CPAP/SIPAP Resmed  Mask Type Full face mask  Mask Size Medium  PEEP 8 cmH20  Flow Rate 5 lpm  Patient Home Equipment No  CPAP/SIPAP surface wiped down Yes

## 2024-02-05 NOTE — Progress Notes (Signed)
   02/05/24 0030  BiPAP/CPAP/SIPAP  $ Non-Invasive Home Ventilator  Subsequent  $ Face Mask Medium Yes  BiPAP/CPAP/SIPAP Pt Type Adult  BiPAP/CPAP/SIPAP Resmed  Mask Type Full face mask  Mask Size Medium  PEEP 8 cmH20  Flow Rate 5 lpm  Patient Home Equipment No  CPAP/SIPAP surface wiped down Yes  BiPAP/CPAP /SiPAP Vitals  Resp (!) 27  MEWS Score/Color  MEWS Score 2  MEWS Score Color Yellow

## 2024-02-06 DIAGNOSIS — N1831 Chronic kidney disease, stage 3a: Secondary | ICD-10-CM | POA: Diagnosis not present

## 2024-02-06 DIAGNOSIS — I5023 Acute on chronic systolic (congestive) heart failure: Secondary | ICD-10-CM | POA: Diagnosis not present

## 2024-02-06 DIAGNOSIS — R7989 Other specified abnormal findings of blood chemistry: Secondary | ICD-10-CM | POA: Diagnosis not present

## 2024-02-06 DIAGNOSIS — I10 Essential (primary) hypertension: Secondary | ICD-10-CM | POA: Diagnosis not present

## 2024-02-06 LAB — BASIC METABOLIC PANEL
Anion gap: 17 — ABNORMAL HIGH (ref 5–15)
BUN: 20 mg/dL (ref 6–20)
CO2: 26 mmol/L (ref 22–32)
Calcium: 9.1 mg/dL (ref 8.9–10.3)
Chloride: 96 mmol/L — ABNORMAL LOW (ref 98–111)
Creatinine, Ser: 1.67 mg/dL — ABNORMAL HIGH (ref 0.44–1.00)
GFR, Estimated: 36 mL/min — ABNORMAL LOW (ref >60)
Glucose, Bld: 142 mg/dL — ABNORMAL HIGH (ref 70–99)
Potassium: 3.9 mmol/L (ref 3.5–5.1)
Sodium: 139 mmol/L (ref 135–145)

## 2024-02-06 LAB — GLUCOSE, CAPILLARY
Glucose-Capillary: 165 mg/dL — ABNORMAL HIGH (ref 70–99)
Glucose-Capillary: 167 mg/dL — ABNORMAL HIGH (ref 70–99)
Glucose-Capillary: 154 mg/dL — ABNORMAL HIGH (ref 70–99)
Glucose-Capillary: 148 mg/dL — ABNORMAL HIGH (ref 70–99)

## 2024-02-06 MED ORDER — FUROSEMIDE 40 MG PO TABS: 80.0000 mg | ORAL_TABLET | Freq: Every day | ORAL | Status: DC

## 2024-02-06 MED ORDER — POTASSIUM CHLORIDE CRYS ER 20 MEQ PO TBCR
40.0000 meq | EXTENDED_RELEASE_TABLET | Freq: Once | ORAL | Status: AC
  Administered 2024-02-06: 40 meq via ORAL
  Filled 2024-02-06: qty 2

## 2024-02-06 MED ORDER — FUROSEMIDE 40 MG PO TABS: 40.0000 mg | ORAL_TABLET | Freq: Every day | ORAL | Status: DC

## 2024-02-06 MED ORDER — FUROSEMIDE 40 MG PO TABS
80.0000 mg | ORAL_TABLET | Freq: Every day | ORAL | Status: DC
  Administered 2024-02-07 – 2024-02-08 (×2): 80 mg via ORAL
  Filled 2024-02-06 (×2): qty 2

## 2024-02-06 MED ORDER — FUROSEMIDE 40 MG PO TABS
40.0000 mg | ORAL_TABLET | Freq: Every evening | ORAL | Status: AC
  Administered 2024-02-06 – 2024-02-07 (×2): 40 mg via ORAL
  Filled 2024-02-06 (×2): qty 1

## 2024-02-06 NOTE — Progress Notes (Signed)
 [facility] PROGRESS NOTE  DANNETTA LEKAS (DOB: Jan 05, 1971) XBJ:[id number] PCP: Loura Back, NP  Brief Narrative: Brandi Gomez is a 53 y.o. female with medical history significant for hypertension, type 2 diabetes mellitus, BMI 53, bipolar disorder, seizures, OSA, and chronic HFrEF who presents with shortness of breath.   Patient reports that she began developing increased dyspnea 1 week ago and has progressively worsened.  She has had a nonproductive cough associated with this.  She denies any chest pain, has not noticed any change in her chronic bilateral leg swelling, and reports orthopnea but notes that it is chronic.  She has not documented any fevers recently.  She was taking Mucinex and cough drops at home but continued to worsen, sought evaluation at an urgent care today, but was found to be hypoxic and was directed to the ED.   ED Course: Upon arrival to the ED, patient is found to be afebrile and saturating well on 50 L/min via heated high flow nasal cannula with tachypnea, mild tachycardia, and elevated blood pressure.  Labs are most notable for creatinine 1.27, normal WBC, normal lactic acid, troponin 43, BNP 274, and negative COVID and influenza PCR.  Chest x-ray concerning for progressive cardiomegaly with suspected pulmonary edema.  CT chest is severely motion degraded but reveals cardiomegaly with suspected interstitial edema and small bilateral pleural effusions.   Blood cultures were collected in the ED and the patient was treated with Lasix, Zofran, acetaminophen, DuoNeb, Rocephin, and azithromycin.  Subjective: Short of breath/weak when walking longer distances, but continues to report good urine output and better exertional capacity.  Objective: BP 102/67 (BP Location: Left Arm)   Pulse 75   Temp 98.1 F (36.7 C) (Oral)   Resp 18   Ht 5\' 3"  (1.6 m)   Wt 129 kg Comment: pt had 2 gowns on  LMP  (LMP Unknown)   SpO2 92%   BMI 50.38 kg/m   Gen: No  distress Pulm: Nonlabored, tachypneic at rest, crackles improved significantly.  CV: RRR, no MRG GI: Soft, NT, ND, +BS  Neuro: Alert and oriented. No new focal deficits. Ext: Warm, no deformities. Skin: No rashes, lesions or ulcers on visualized skin   Assessment & Plan: Acute on chronic combined HFrEF: LVEF 30-35%, global hypokinesis, G2DD, RVSF ok.  - GDMT limited w/mild CKD, hx angioedema to lisinopril, changing to hydral/isosorbide, add spironolactone, continue coreg.  - Appreciate cardiology consultation, planning transition to oral lasix given Cr bump and clinical improvement. Suspect clinically she may be euvolemic, so planning to defer RHC at this time.  - Continue monitoring I/O, daily weights  Acute hypoxic respiratory failure: Due to CHF as above.  - Will repeat ambulatory pulse oximetry 2/29.   S. hominis blood culture contaminant: Afebrile, note transient leukocytosis that has resolved. PCT 0.45. Only 1 of 2 cultures resulting positive (rechecked, still NGTD on other). Note CT chest showed patchy opacities as well as diffuse interstitial prominence and effusions, suggestive of pulmonary edema favored over multifocal pneumonia. Continue to monitor off abx.   Demand myocardial ischemia: Quite mild w/flat troponins in 40's, no angina, no ST segment deviations on ECG. No regional WMA on echo.  - D/w cardiology, defer ischemic evaluation to outpatient setting.   NIDT2DM: HbA1c is 7.3%.  - Continue SSI - Note patient was prescribed mounjaro but hasn't been taking regularly. This would help with obesity as well    Bipolar disorder and seizure disorder: Quiescent.  - Continue abilify, hydroxyzine, lamictal, clobazam  Stage IIIa CKD:  - Cr up with IV diuresis, UOP not completely documented, weights similarly not reliable. - Avoid nephrotoxins.   History of TIA/CVA:  - Continue statin. Pt hasn't been on aspirin since 2021.    OSA  - Continue CPAP qHS.  Class III obesity:  Body mass index is 50.38 kg/m.   Vitamin B12 deficiency:  - Started IM supplementation here, convert to po at discharge.   HLD:  - Continue statin   Tyrone Nine, MD [facility] [email or web address] 02/06/2024, 4:03 PM

## 2024-02-06 NOTE — Progress Notes (Signed)
 Mobility Specialist Progress Note:   02/06/24 1100  Mobility  Activity Ambulated with assistance in hallway  Level of Assistance Standby assist, set-up cues, supervision of patient - no hands on  Assistive Device None  Distance Ambulated (ft) 150 ft  Activity Response Tolerated well  Mobility Referral Yes  Mobility visit 1 Mobility  Mobility Specialist Start Time (ACUTE ONLY) 1100  Mobility Specialist Stop Time (ACUTE ONLY) 1120  Mobility Specialist Time Calculation (min) (ACUTE ONLY) 20 min   Pt agreeable to mobility session. Required no physical assistance throughout, only supervision. Did required 2L supplemental O2 to ambulate, stable on RA at rest. Pt back sitting EOB with all needs met.   Addison Lank Mobility Specialist Please contact via SecureChat or  Rehab office at [phone]

## 2024-02-06 NOTE — Progress Notes (Signed)
 Heart Failure Stewardship Pharmacist Progress Note   PCP: Loura Back, NP PCP-Cardiologist: Chilton Si, MD   HPI:  Brandi Gomez is a 65 YOF with PMH of HTN, T2DM, CVA, obesity, bipolar disorder, seizures, OSA, and HFrEF.  She presented to to [facility] ED on 02/02/24 with primary complaint of SOB. The patient reported increased DOE for the past week. She has had a nonproductive cough as well. She denied any chest pain or palpitations. She reports no changes to her already present LEE. Vitals upon arrival was afebrile, SpO2 96% on HFNC 5 L of O2, BP 146/102 mmHg, and HR 71 bpm. Labs obtained WBC 8.3, Scr 1.27 (BL ~1.1), BUN 11, K 4.2, Na 137, flat troponins, BNP 274. CXR showed progressive cardiac enlargement with suspected pulmonary edema. She was given furosemide IV and resumed on carvedilol. ECHO 02/03/24 LVEF 30-35%, LV GHK, RV normal, grade 2 DD.  Today patient is doing well. She is still having SOB and requiring [place] 2 L supplemental oxygen in addition to CPAP overnight. Patient has limited mobility at baseline. Today she is able to ambulate in the room on her own and only requiring assistance from a nurse when walking in the halls. She has bilateral 1+ pitting LEE extending to her knees. She denies any dizziness or lightheadedness. No reports of PND and orthopnea. No reports of chest pain or palpitations. Her appetite is OK. Plans for [facility] once patient is closer to euvolemic status; may be done outpatient.   Provided HF GDMT education to patient. We discussed importance of adherence to all of her medications. I asked if she felt if taking Bidil three times a day would be feasible for her, and she voiced concerns that her daughter may protest her being on too many medications. With this patient's memory complications at baseline, she states that she is very reliant on her daughter to help her remember to take her medications as prescribed. The patient states that her daughter has a learning  disability and this makes it difficult to reason with her.   Current HF Medications: Diuretic: furosemide 40 mg IV BID Beta Blocker: carvedilol 6.25 mg BID MRA: spironolactone 12.5 mg daily Other: Bidil (20 mg-37.5 mg) #0.5 tablet TID; start 02/06/24  Prior to admission HF Medications: Diuretic: torsemide 10 mg daily Beta blocker: carvedilol 6.25 mg BID  Pertinent Lab Values: Serum creatinine 1.67, BUN 20, Potassium 3.9, Sodium 139 02/04/24: Magnesium 1.9 02/03/24: A1c 7.3%  Vital Signs: Weight: 284 lbs (admission weight: 287 lbs) Blood pressure: 110-120/60-70 mmHg Heart rate: 65-75 bpm  I/O: incomplete net I/O yesterday and since admission  Medication Assistance / Insurance Benefits Check: Does the patient have prescription insurance?  Yes Type of insurance plan: [employer] Medicare Part D  Does the patient qualify for medication assistance through manufacturers or grants?   No; $0 co-pays for brand-name medications  Outpatient Pharmacy:  Prior to admission outpatient [facility] [place], [place] Is the patient willing to use [facility] TOC pharmacy at discharge? Yes Is the patient willing to transition their outpatient pharmacy to utilize a [facility] outpatient pharmacy?   No    Assessment: 1. Acute on chronic systolic + diastolic CHF (LVEF 30-35%), ischemic work-up pending. NYHA class II symptoms. - Patient remains hypervolemic with continued SOB requiring [place] 2L of supplemental oxygen and bilateral 1+ pitting LEE. Her reported I/Os are incomplete from yesterday and for this admission- reduce diuretic intensity; transition from IV to PO furosemide - Patient low-normal BP and AKI  with elevated Scr - continue low-dose BB; continue MRA; OK to continue Bidil but therapy may not be ideal for this patient with adherence concerns - Patient has history of angioedema with lisinopril - do not start RAASi - Patient has limited mobility at baseline and at home, with SGLT2i therapy this  would predispose the patient to genitourinary tract infections - would not start SGLT2i - Serum potassium is low at 3.9 mmol/L - requires replacement   Plan: 1) Medication changes recommended at this time: - Switch furosemide IV to PO; 80 mg every morning and 40 mg every evening - OK to continue spironolactone 12.5 mg daily - OK to start Bidil #0.5 tab TID; monitor BP closely - Give potassium chloride 20 mEq PO x1   2) Patient assistance: - Patient would like first fills of medications at [facility] and [facility] [address]  3)  Education  - Initial education completed 02/05/24 - Final education to be completed prior to discharge  Wilmer Floor, PharmD PGY2 Cardiology Pharmacy Resident Heart Failure Stewardship Phone [phone]

## 2024-02-06 NOTE — Progress Notes (Addendum)
 Patient Name: MATTILYNN FORRER Date of Encounter: 02/06/2024 [facility] Cardiologist: Chilton Si, MD   Interval Summary  .    Patient sitting on edge of the bed She has had no urine output charted in last 24 hours but reports good urine output  Her creatinine and BUN have increased and her weight minimally changed Will switch to PO Lasix regimen, hold off on RHC at this time  Vital Signs .    Vitals:   02/05/24 1925 02/06/24 0042 02/06/24 0457 02/06/24 0719  BP: 122/73 113/63 120/66 112/70  Pulse: 72 64 66 66  Resp: 17 18 17 20   Temp: 98.3 F (36.8 C) 97.9 F (36.6 C) (!) 97.4 F (36.3 C) 97.8 F (36.6 C)  TempSrc: Oral Oral Oral Oral  SpO2: 94% 96% 95% 90%  Weight:   129 kg   Height:        Intake/Output Summary (Last 24 hours) at 02/06/2024 0920 Last data filed at 02/06/2024 0841 Gross per 24 hour  Intake 223 ml  Output 300 ml  Net -77 ml      02/06/2024    4:57 AM 02/05/2024    9:00 AM 02/04/2024    4:46 AM  Last 3 Weights  Weight (lbs) 284 lb 6.3 oz 284 lb 13.4 oz 287 lb 14.4 oz  Weight (kg) 129 kg 129.2 kg 130.591 kg      Telemetry/ECG    Sinus rhythm, HR 60s - Personally Reviewed  Physical Exam .   GEN: No acute distress, currently on room air   Neck: unable to assess JVD due to body habitus Cardiac: distant heart sounds, RRR, no murmurs, rubs, or gallops.  Respiratory: Clear to auscultation bilaterally. GI: Soft, nontender, non-distended  MS: No edema  Assessment & Plan .     Acute on chronic HFrEF (combined systolic & diastolic HF) with HTN - Echo from 02/03/24 showed: LVEF 30-35%, global hypokinesis, grade 2 diastolic dysfunction, normal RV, mildly dilated left atria  - BNP 273, CT chest showed interstitial edema, bilateral small pleural effusions  - Weight 284 lb today, down from 287 lb on admission  - Most recent BP 112/70, HR 66 - Was on IV Lasix 40 mg BID without much response, increased to 60 mg BID 2/27 -- there was no  urine output charted yesterday - Creatinine 1.67, BUN 20, both up from yesterday  Continue CoReg 6.25 mg BID -- consolidate once euvolemic  Continue spironolactone 12.5 mg daily Added BiDil 0.5 mg TID today (consolidating Hydral & Nitrate) Defer SGLT2i due to frequent UTIs Defer ACE/ARB due to history of angioedema with lisinopril  Consider GLP1 in outpatient setting  Received 60 mg IV Lasix this AM, due to inability to truly assess volume status on physical exam, with minimal weight change, increase in BUN and creatinine patient might be close to euvolemia Will switch to PO Lasix: 40 mg this PM, then 80 mg AM with 40 mg PM tomorrow, then will do 80 mg daily with 40 mg PRN With up-trend in Cr, suspect that we have reached the end of IV diuresis - unfortunately weights have not been consistent.  Agree with converting to PO Lasix (eventually 80 mg daily with additional 40 mg for wgt gain > 3 lb)  Elevated troponin  43 > 48 Suspect demand ischemia in the setting of acute on chronic CHF Patient has not undergone ischemic evaluation to determine cause of heart failure Consider ischemic evaluation as outpatient -  but not  an urgent concern at this time.  Hyperlipidemia, goal LDL < 70 Last lipid panel 12/6107: LDL 44,  Continue Lipitor 40 mg daily   Per primary Acute hypoxic respiratory failure CKD stage 3a Bipolar Seizure disorder History of TIA/CVA OSA Diabetes  Vitamin B12 deficiency   For questions or updates, please contact [facility] Please consult [email or web address] for contact info under        Signed, Olena Leatherwood, PA-C    ATTENDING ATTESTATION  I have seen, examined and evaluated the patient this Am on rounds along with Evlyn Clines, PA-C.  After reviewing all the available data and chart, we discussed the patients laboratory, study & physical findings as well as symptoms in detail.  I agree with her findings, examination as well as impression  recommendations as per our discussion.    Attending adjustments noted in italics.   NOORAH GIAMMONA is a 53 y.o. female with a hx of chronic systolic and diastolic HF, HTN, HLD, DM2, morbid obesity, CKD 3, OSA on CPAP, bipolar, anxiety, epilepsy and CVA who is being followed for the evaluation of heart failure -- likely related to dietary indiscretion & lack of adequate diuretic / BP control.  Has done well with IV diuresiss - breathing better & has had an up-turn in Cr suggesting that she is adequately diuresed --> plan is converting to PO diuretic to ensure adequate response.   Meds have been adjusted / consolidated for ease of dosing.  Would anticipate that she should be stable for d/c in the next 1-2 days as long as renal function does not worsen.     Marykay Lex, MD, MS Bryan Lemma, M.D., M.S. Interventional Cardiologist  [facility]  Pager # [phone] Phone # [phone] [address] [place], [place] [address] '

## 2024-02-06 NOTE — Plan of Care (Signed)
   Problem: Education: Goal: Ability to describe self-care measures that may prevent or decrease complications (Diabetes Survival Skills Education) will improve Outcome: Progressing Goal: Individualized Educational Video(s) Outcome: Progressing   Problem: Coping: Goal: Ability to adjust to condition or change in health will improve Outcome: Progressing

## 2024-02-06 NOTE — Plan of Care (Signed)
   Problem: Education: Goal: Ability to describe self-care measures that may prevent or decrease complications (Diabetes Survival Skills Education) will improve Outcome: Progressing Goal: Individualized Educational Video(s) Outcome: Progressing   Problem: Fluid Volume: Goal: Ability to maintain a balanced intake and output will improve Outcome: Progressing   Problem: Health Behavior/Discharge Planning: Goal: Ability to identify and utilize available resources and services will improve Outcome: Progressing Goal: Ability to manage health-related needs will improve Outcome: Progressing   Problem: Metabolic: Goal: Ability to maintain appropriate glucose levels will improve Outcome: Progressing

## 2024-02-06 NOTE — Evaluation (Signed)
 Occupational Therapy Evaluation Patient Details Name: Brandi Gomez MRN: [id number] DOB: 1971-05-12 Today's Date: 02/06/2024   History of Present Illness   Pt is a 53 y.o. female who presented 02/02/24 with SOB. Admitted with acute on chronic HFrEF. PMH: HTN, DM2, bipolar disorder, seizures, OSA, chronic HFrEF, CVA     Clinical Impressions Pt presents with decline in function and safety with ADLs and ADL mobility with impaired activity tolerance. PTA, pt lived with family and was Ind with ADLs and ADL mobility without AD, lives with her daughter (who has intellectual disabilities). Pt reports a hx of memory and cognitive deficits from her prior CVA as well. Pt currently requires CGA -Sup with LB ADLs and mobility/transfers. O2 SATs remained stable 95-96% throughout in room activity on RA. Pt would benefit from acute OT service to address impairments to maximize level of function and safety   If plan is discharge home, recommend the following:   Assist for transportation;Assistance with cooking/housework     Functional Status Assessment   Patient has had a recent decline in their functional status and demonstrates the ability to make significant improvements in function in a reasonable and predictable amount of time.     Equipment Recommendations   Tub/shower seat     Recommendations for Other Services         Precautions/Restrictions   Precautions Precautions: Fall Recall of Precautions/Restrictions: Intact Precaution/Restrictions Comments: Watch SpO2 Restrictions Weight Bearing Restrictions Per Provider Order: No     Mobility Bed Mobility Overal bed mobility: Modified Independent                  Transfers Overall transfer level: Needs assistance Equipment used: Rolling walker (2 wheels) Transfers: Sit to/from Stand Sit to Stand: Contact guard assist                  Balance Overall balance assessment: Needs assistance Sitting-balance  support: No upper extremity supported, Feet supported Sitting balance-Leahy Scale: Good     Standing balance support: During functional activity, Reliant on assistive device for balance, Bilateral upper extremity supported, No upper extremity supported Standing balance-Leahy Scale: Fair                             ADL either performed or assessed with clinical judgement   ADL Overall ADL's : Needs assistance/impaired Eating/Feeding: Independent   Grooming: Wash/dry hands;Wash/dry face;Supervision/safety;Standing   Upper Body Bathing: Supervision/ safety   Lower Body Bathing: Supervison/ safety   Upper Body Dressing : Supervision/safety   Lower Body Dressing: Supervision/safety   Toilet Transfer: Supervision/safety;Ambulation   Toileting- Clothing Manipulation and Hygiene: Supervision/safety   Tub/ Shower Transfer: Supervision/safety;Ambulation;Grab bars   Functional mobility during ADLs: Supervision/safety General ADL Comments: pt educated on energy conservation strategies, DME and ADs; handout provided     Vision Ability to See in Adequate Light: 0 Adequate Patient Visual Report: No change from baseline       Perception         Praxis         Pertinent Vitals/Pain Pain Assessment Pain Assessment: No/denies pain     Extremity/Trunk Assessment Upper Extremity Assessment Upper Extremity Assessment: Overall WFL for tasks assessed   Lower Extremity Assessment Lower Extremity Assessment: Defer to PT evaluation       Communication Communication Communication: Impaired   Cognition Arousal: Alert Behavior During Therapy: [facility] for tasks assessed/performed Cognition: No apparent impairments  Following commands: Intact       Cueing  General Comments          Exercises     Shoulder Instructions      Home Living Family/patient expects to be discharged to:: Private residence Living Arrangements:  Children Available Help at Discharge: Family;Available PRN/intermittently Type of Home: House Home Access: Stairs to enter [employer] of Steps: 5 Entrance Stairs-Rails: Left Home Layout: One level     Bathroom Shower/Tub: Chief Strategy Officer: Standard     Home Equipment: BSC/3in1          Prior Functioning/Environment Prior Level of Function : Needs assist             Mobility Comments: Independent without AD ADLs Comments: friend does cooking, cleaning, and driving; pt bathes at sink and dresses herself    OT Problem List: Decreased knowledge of use of DME or AE;Decreased activity tolerance;Obesity   OT Treatment/Interventions:        OT Goals(Current goals can be found in the care plan section)   Acute Rehab OT Goals Patient Stated Goal: go home OT Goal Formulation: With patient Time For Goal Achievement: 02/21/24 Potential to Achieve Goals: Good ADL Goals Additional ADL Goal #1: Pt will verbalize and demo 3 energy conservation strategie during ADLs and ADL mobility tasks   OT Frequency:       Co-evaluation              AM-PAC OT "6 Clicks" Daily Activity     Outcome Measure Help from another person eating meals?: None Help from another person taking care of personal grooming?: A Little Help from another person toileting, which includes using toliet, bedpan, or urinal?: A Little Help from another person bathing (including washing, rinsing, drying)?: A Little Help from another person to put on and taking off regular upper body clothing?: A Little Help from another person to put on and taking off regular lower body clothing?: A Little 6 Click Score: 19   End of Session    Activity Tolerance: Patient tolerated treatment well Patient left: in bed (sitting EOB)  OT Visit Diagnosis: Muscle weakness (generalized) (M62.81)                Time: 1610-9604 OT Time Calculation (min): 23 min Charges:  OT General Charges $OT  Visit: 1 Visit OT Evaluation $OT Eval Low Complexity: 1 Low OT Treatments $Self Care/Home Management : 8-22 mins    Galen Manila 02/06/2024, 1:11 PM

## 2024-02-07 DIAGNOSIS — N1831 Chronic kidney disease, stage 3a: Secondary | ICD-10-CM | POA: Diagnosis not present

## 2024-02-07 DIAGNOSIS — I10 Essential (primary) hypertension: Secondary | ICD-10-CM

## 2024-02-07 DIAGNOSIS — I5023 Acute on chronic systolic (congestive) heart failure: Secondary | ICD-10-CM | POA: Diagnosis not present

## 2024-02-07 DIAGNOSIS — R7989 Other specified abnormal findings of blood chemistry: Secondary | ICD-10-CM | POA: Diagnosis not present

## 2024-02-07 LAB — CULTURE, BLOOD (ROUTINE X 2): Culture: NO GROWTH

## 2024-02-07 LAB — BASIC METABOLIC PANEL
Anion gap: 12 (ref 5–15)
BUN: 22 mg/dL — ABNORMAL HIGH (ref 6–20)
CO2: 30 mmol/L (ref 22–32)
Calcium: 9.5 mg/dL (ref 8.9–10.3)
Chloride: 96 mmol/L — ABNORMAL LOW (ref 98–111)
Creatinine, Ser: 1.75 mg/dL — ABNORMAL HIGH (ref 0.44–1.00)
GFR, Estimated: 34 mL/min — ABNORMAL LOW (ref >60)
Glucose, Bld: 194 mg/dL — ABNORMAL HIGH (ref 70–99)
Potassium: 4.2 mmol/L (ref 3.5–5.1)
Sodium: 138 mmol/L (ref 135–145)

## 2024-02-07 LAB — LIPID PANEL
Cholesterol: 153 mg/dL (ref 0–200)
HDL: 37 mg/dL — ABNORMAL LOW (ref >40)
LDL Cholesterol: 91 mg/dL (ref 0–99)
Total CHOL/HDL Ratio: 4.1 ratio
Triglycerides: 123 mg/dL (ref <150)
VLDL: 25 mg/dL (ref 0–40)

## 2024-02-07 LAB — GLUCOSE, CAPILLARY
Glucose-Capillary: 136 mg/dL — ABNORMAL HIGH (ref 70–99)
Glucose-Capillary: 200 mg/dL — ABNORMAL HIGH (ref 70–99)
Glucose-Capillary: 184 mg/dL — ABNORMAL HIGH (ref 70–99)
Glucose-Capillary: 162 mg/dL — ABNORMAL HIGH (ref 70–99)
Glucose-Capillary: 198 mg/dL — ABNORMAL HIGH (ref 70–99)

## 2024-02-07 NOTE — Plan of Care (Signed)
   Problem: Education: Goal: Ability to describe self-care measures that may prevent or decrease complications (Diabetes Survival Skills Education) will improve Outcome: Progressing

## 2024-02-07 NOTE — Progress Notes (Signed)
 Mobility Specialist Progress Note;   02/07/24 0905  Mobility  Activity Ambulated with assistance in hallway  Level of Assistance Standby assist, set-up cues, supervision of patient - no hands on  Assistive Device None  Distance Ambulated (ft) 200 ft  Activity Response Tolerated well  Mobility Referral Yes  Mobility visit 1 Mobility  Mobility Specialist Start Time (ACUTE ONLY) 0905  Mobility Specialist Stop Time (ACUTE ONLY) 0912  Mobility Specialist Time Calculation (min) (ACUTE ONLY) 7 min   Pt agreeable to mobility. Required no physical assistance during ambulation, SV. Ambulated on RA, SPO2 91-97% throughout. Pt displayed some fatigue after 28ft of ambulating, otherwise no c/o. Pt returned back to chair with all needs met.   Caesar Bookman Mobility Specialist Please contact via SecureChat or [employer] [phone]

## 2024-02-07 NOTE — Progress Notes (Signed)
 Rounding Note    Patient Name: MCKAYLIN BASTIEN Date of Encounter: 02/07/2024  [facility] Cardiologist: Chilton Si, MD   Subjective   Well, feels that she is at her baseline respiratory status  Inpatient Medications    Scheduled Meds:  ARIPiprazole  5 mg Oral Daily   atorvastatin  40 mg Oral Daily   carvedilol  6.25 mg Oral BID WC   cloBAZam  10 mg Oral QHS   enoxaparin (LOVENOX) injection  40 mg Subcutaneous Daily   furosemide  40 mg Oral QPM   furosemide  80 mg Oral Daily   insulin aspart  0-5 Units Subcutaneous QHS   insulin aspart  0-6 Units Subcutaneous TID WC   isosorbide-hydrALAZINE  0.5 tablet Oral TID   lamoTRIgine  200 mg Oral BID   sodium chloride flush  3 mL Intravenous Q12H   spironolactone  12.5 mg Oral Daily   Continuous Infusions:  PRN Meds: acetaminophen **OR** acetaminophen, hydrOXYzine, oxyCODONE, polyethylene glycol   Vital Signs    Vitals:   02/07/24 0002 02/07/24 0327 02/07/24 0436 02/07/24 0720  BP: 124/62 (!) 99/49  (!) 108/45  Pulse: 79 69  64  Resp: 18 18  19   Temp: 98.1 F (36.7 C) 97.9 F (36.6 C)  97.8 F (36.6 C)  TempSrc: Oral Oral  Oral  SpO2: 93% 92%  93%  Weight:   129.2 kg   Height:        Intake/Output Summary (Last 24 hours) at 02/07/2024 1101 Last data filed at 02/07/2024 1610 Gross per 24 hour  Intake 360 ml  Output 1150 ml  Net -790 ml      02/07/2024    4:36 AM 02/06/2024    4:57 AM 02/05/2024    9:00 AM  Last 3 Weights  Weight (lbs) 284 lb 13.4 oz 284 lb 6.3 oz 284 lb 13.4 oz  Weight (kg) 129.2 kg 129 kg 129.2 kg      Telemetry    Sinus rhythm- Personally Reviewed  ECG    None new- Personally Reviewed  Physical Exam   GEN: No acute distress.   Neck: No JVD Cardiac: RRR, no murmurs, rubs, or gallops.  Respiratory: Clear to auscultation bilaterally. GI: Soft, nontender, non-distended  MS: No edema; No deformity. Neuro:  Nonfocal  Psych: Normal affect   Labs    High  Sensitivity Troponin:   Recent Labs  Lab 02/02/24 2054 02/03/24 0523  TROPONINIHS 43* 48*     Chemistry Recent Labs  Lab 02/02/24 1816 02/03/24 0523 02/04/24 0254 02/05/24 0309 02/06/24 0240 02/07/24 0800  NA 137 137 139 137 139 138  K 4.2 3.7 3.5 3.5 3.9 4.2  CL 103 103 100 96* 96* 96*  CO2 24 27 28 28 26 30   GLUCOSE 118* 156* 148* 142* 142* 194*  BUN 11 12 15 12 20  22*  CREATININE 1.27* 1.32* 1.49* 1.44* 1.67* 1.75*  CALCIUM 8.6* 8.5* 8.6* 8.7* 9.1 9.5  MG 2.0 1.9 1.9  --   --   --   PROT 7.7  --   --   --   --   --   ALBUMIN 3.4*  --   --   --   --   --   AST 18  --   --   --   --   --   ALT 18  --   --   --   --   --   ALKPHOS 79  --   --   --   --   --  BILITOT 0.6  --   --   --   --   --   GFRNONAA 50* 48* 41* 43* 36* 34*  ANIONGAP 10 7 11 13  17* 12    Lipids  Recent Labs  Lab 02/07/24 0800  CHOL 153  TRIG 123  HDL 37*  LDLCALC 91  CHOLHDL 4.1    Hematology Recent Labs  Lab 02/02/24 1816 02/03/24 0523 02/04/24 0254  WBC 8.3 10.8* 7.4  RBC 3.62* 3.73* 3.49*  HGB 10.9* 11.1* 10.4*  HCT 35.0* 35.6* 32.9*  MCV 96.7 95.4 94.3  MCH 30.1 29.8 29.8  MCHC 31.1 31.2 31.6  RDW 14.0 14.0 14.2  PLT 281 248 271   Thyroid No results for input(s): "TSH", "FREET4" in the last 168 hours.  BNP Recent Labs  Lab 02/02/24 1816  BNP 273.5*    DDimer No results for input(s): "DDIMER" in the last 168 hours.   Radiology    No results found.  Cardiac Studies     Patient Profile     53 y.o. female presented to the hospital with shortness of breath, found to be in acute on chronic systolic heart failure exacerbation  Assessment & Plan    1.  Acute on chronic systolic heart failure: Ejection fraction 30 to 35%.  Creatinine is remained stable with diuresis.  She feels that she is at her baseline.  She has been switched to p.o. Lasix.  Would continue with current management.  Shekira Drummer arrange for follow-up in cardiology clinic.  2.  Elevated troponin: Likely  due to demand ischemia.  No ischemic workup at this time  3.  Hyperlipidemia: Continue Lipitor [facility] Arjen Deringer sign off.   Medication Recommendations: Continue current dose of Lasix Other recommendations (labs, testing, etc):   Follow up as an outpatient: Clarkson Rosselli be arranged in cardiology clinic  For questions or updates, please contact [facility] Please consult [email or web address] for contact info under        Signed, Courtland Coppa Jorja Loa, MD  02/07/2024, 11:01 AM

## 2024-02-07 NOTE — Progress Notes (Signed)
 [facility] PROGRESS NOTE  KRYSSA RISENHOOVER (DOB: 21-Jan-1971) ZOX:[id number] PCP: Loura Back, NP  Brief Narrative: Brandi Gomez is a 53 y.o. female with medical history significant for hypertension, type 2 diabetes mellitus, BMI 53, bipolar disorder, seizures, OSA, and chronic HFrEF who presents with shortness of breath.   Patient reports that she began developing increased dyspnea 1 week ago and has progressively worsened.  She has had a nonproductive cough associated with this.  She denies any chest pain, has not noticed any change in her chronic bilateral leg swelling, and reports orthopnea but notes that it is chronic.  She has not documented any fevers recently.  She was taking Mucinex and cough drops at home but continued to worsen, sought evaluation at an urgent care today, but was found to be hypoxic and was directed to the ED.   ED Course: Upon arrival to the ED, patient is found to be afebrile and saturating well on 50 L/min via heated high flow nasal cannula with tachypnea, mild tachycardia, and elevated blood pressure.  Labs are most notable for creatinine 1.27, normal WBC, normal lactic acid, troponin 43, BNP 274, and negative COVID and influenza PCR.  Chest x-ray concerning for progressive cardiomegaly with suspected pulmonary edema.  CT chest is severely motion degraded but reveals cardiomegaly with suspected interstitial edema and small bilateral pleural effusions.   Blood cultures were collected in the ED and the patient was treated with Lasix, Zofran, acetaminophen, DuoNeb, Rocephin, and azithromycin.  Subjective: Feels better, breathing near baseline but tuckers out considerably with associated hypoxia still. No chest pain. Leg swelling near baseline.   Objective: BP (!) 108/45 (BP Location: Right Arm)   Pulse 64   Temp 97.8 F (36.6 C) (Oral)   Resp 19   Ht 5\' 3"  (1.6 m)   Wt 129.2 kg   LMP  (LMP Unknown)   SpO2 93%   BMI 50.46 kg/m   Gen: No  distress Pulm: Clear, nonlabored, tachypnea  CV: RRR GI: Soft, NT, ND, +BS  Neuro: Alert and oriented. No new focal deficits. Ext: Warm, no deformities Skin: No rashes, lesions or ulcers on visualized skin   Assessment & Plan: Acute on chronic combined HFrEF: LVEF 30-35%, global hypokinesis, G2DD, RVSF ok.  - GDMT limited w/mild CKD, hx angioedema to lisinopril, changing to hydral/isosorbide, add spironolactone, continue coreg.  - Appreciate cardiology consultation, continue lasix 80mg  qAM, 40mg  qPM today and down to once daily lasix starting tmrw.  - Will have cardiology follow up arranged, we will defer RHC at this time.  - Continue monitoring I/O, daily weights  Acute hypoxic respiratory failure: Due to CHF as above.  - Repeat pulse oximetry today shows exertional capacity has returned near normal, still (newly) hypoxic. Continue diuresis and recheck tmrw.  S. hominis blood culture contaminant: Afebrile, note transient leukocytosis that has resolved. PCT 0.45. Only 1 of 2 cultures resulted positive, other is negative. Note CT chest showed patchy opacities as well as diffuse interstitial prominence and effusions, suggestive of pulmonary edema favored over multifocal pneumonia.  - Continue to monitor off abx.   Demand myocardial ischemia: Quite mild w/flat troponins in 40's, no angina, no ST segment deviations on ECG. No regional WMA on echo.  - D/w cardiology, defer ischemic evaluation to outpatient setting.   NIDT2DM: HbA1c is 7.3%.  - Continue SSI - Note patient was prescribed mounjaro but hasn't been taking regularly. This would help with obesity as well    Bipolar disorder and seizure  disorder: Quiescent.  - Continue abilify, hydroxyzine, lamictal, clobazam     Stage IIIa CKD:  - Cr slightly up day over day despite deescalation of diuresis. As above, will continue po lasix per cardiology rec's and recheck BMP in AM.  - Avoid nephrotoxins.   History of TIA/CVA:  - Continue  statin. Pt hasn't been on aspirin since 2021.    OSA  - Continue CPAP qHS.  Class III obesity: Body mass index is 50.46 kg/m.   Vitamin B12 deficiency:  - Started IM supplementation here, convert to po at discharge.   HLD:  - Continue statin   Tyrone Nine, MD [facility] [email or web address] 02/07/2024, 11:08 AM

## 2024-02-07 NOTE — Progress Notes (Signed)
 The patient ambulated up and down with hall with assistance for approximately 5 minutes. Pulse ox saturation was 91%-88% on RA. When she was returning to her room her gait became a little unsteady.

## 2024-02-08 DIAGNOSIS — I5023 Acute on chronic systolic (congestive) heart failure: Secondary | ICD-10-CM | POA: Diagnosis not present

## 2024-02-08 LAB — BASIC METABOLIC PANEL
Anion gap: 15 (ref 5–15)
BUN: 24 mg/dL — ABNORMAL HIGH (ref 6–20)
CO2: 27 mmol/L (ref 22–32)
Calcium: 9.4 mg/dL (ref 8.9–10.3)
Chloride: 94 mmol/L — ABNORMAL LOW (ref 98–111)
Creatinine, Ser: 1.65 mg/dL — ABNORMAL HIGH (ref 0.44–1.00)
GFR, Estimated: 37 mL/min — ABNORMAL LOW (ref >60)
Glucose, Bld: 167 mg/dL — ABNORMAL HIGH (ref 70–99)
Potassium: 3.9 mmol/L (ref 3.5–5.1)
Sodium: 136 mmol/L (ref 135–145)

## 2024-02-08 LAB — GLUCOSE, CAPILLARY: Glucose-Capillary: 144 mg/dL — ABNORMAL HIGH (ref 70–99)

## 2024-02-08 MED ORDER — ISOSORB DINITRATE-HYDRALAZINE 20-37.5 MG PO TABS: 0.5000 | ORAL_TABLET | Freq: Three times a day (TID) | ORAL | 0 refills | Status: DC

## 2024-02-08 MED ORDER — FUROSEMIDE 80 MG PO TABS: 80.0000 mg | ORAL_TABLET | Freq: Every day | ORAL | 0 refills | Status: DC

## 2024-02-08 MED ORDER — SALINE SPRAY 0.65 % NA SOLN
1.0000 | NASAL | Status: DC | PRN
  Filled 2024-02-08: qty 44

## 2024-02-08 MED ORDER — VITAMIN B-12 1000 MCG PO TABS: 1000.0000 ug | ORAL_TABLET | Freq: Every day | ORAL | 0 refills | Status: AC

## 2024-02-08 MED ORDER — SPIRONOLACTONE 25 MG PO TABS: 12.5000 mg | ORAL_TABLET | Freq: Every day | ORAL | 0 refills | Status: DC

## 2024-02-08 NOTE — Discharge Summary (Signed)
 Physician Discharge Summary   Patient: Brandi Gomez MRN: [id number] DOB: 1971-02-18  Admit date:     02/02/2024  Discharge date: 02/08/24  Discharge Physician: Tyrone Nine   PCP: Loura Back, NP   Recommendations at discharge:  Follow up with PCP in 1-2 weeks.  Recheck BMP at follow up.  Consider aspirin given history of TIA/CVA.  Follow up in HF clinic as scheduled 3/10 at 11:00am.  Follow up at [facility] sleep lab 3/11   Discharge Diagnoses: Principal Problem:   Acute on chronic HFrEF (heart failure with reduced ejection fraction) (HCC) Active Problems:   Controlled type 2 diabetes mellitus without complication, without long-term current use of insulin (HCC)   OSA (obstructive sleep apnea)   Bipolar 1 disorder (HCC)   CKD (chronic kidney disease) stage 3, GFR 30-59 ml/min (HCC)   Acute respiratory failure with hypoxia (HCC)   Elevated troponin  Hospital Course: Brandi Gomez is a 53 y.o. female with medical history significant for hypertension, type 2 diabetes mellitus, BMI 53, bipolar disorder, seizures, OSA, and chronic HFrEF who presents with shortness of breath.   Patient reports that she began developing increased dyspnea 1 week ago and has progressively worsened.  She has had a nonproductive cough associated with this.  She denies any chest pain, has not noticed any change in her chronic bilateral leg swelling, and reports orthopnea but notes that it is chronic.  She has not documented any fevers recently.  She was taking Mucinex and cough drops at home but continued to worsen, sought evaluation at an urgent care today, but was found to be hypoxic and was directed to the ED.   ED Course: Upon arrival to the ED, patient is found to be afebrile and saturating well on 50 L/min via heated high flow nasal cannula with tachypnea, mild tachycardia, and elevated blood pressure.  Labs are most notable for creatinine 1.27, normal WBC, normal lactic acid, troponin 43, BNP 274, and  negative COVID and influenza PCR.  Chest x-ray concerning for progressive cardiomegaly with suspected pulmonary edema.  CT chest is severely motion degraded but reveals cardiomegaly with suspected interstitial edema and small bilateral pleural effusions.   Blood cultures were collected in the ED and the patient was treated with Lasix, Zofran, acetaminophen, DuoNeb, Rocephin, and azithromycin. Pneumonia was felt to be ruled out, antibiotics stopped and diuresis continued with resolution of hypoxemia.  Assessment and Plan: Acute on chronic combined HFrEF: LVEF 30-35%, global hypokinesis, G2DD, RVSF ok.  - GDMT limited w/mild CKD, hx angioedema to lisinopril, changing to hydral/isosorbide, added spironolactone, continue coreg.  - Appreciate cardiology consultation, continue lasix 80mg  qAM, defer RHC at this time.  - Continue monitoring weights at follow up.   Acute hypoxic respiratory failure: Due to CHF as above.  - Repeat pulse oximetry has shown resolution of hypoxemia even with exertion.     S. hominis blood culture contaminant: Afebrile, note transient leukocytosis that has resolved. PCT 0.45. Only 1 of 2 cultures resulted positive, other is negative. Note CT chest showed patchy opacities as well as diffuse interstitial prominence and effusions, suggestive of pulmonary edema favored over multifocal pneumonia.  - Continue to monitor off abx.   Demand myocardial ischemia: Quite mild w/flat troponins in 40's, no angina, no ST segment deviations on ECG. No regional WMA on echo.  - D/w cardiology, defer ischemic evaluation to outpatient setting.   NIDT2DM: HbA1c is 7.3%.  - Continue SSI - Note patient was prescribed mounjaro but hasn't  been taking regularly. This would help with obesity as well    Bipolar disorder and seizure disorder: Quiescent.  - Continue abilify, hydroxyzine, lamictal, clobazam     Stage IIIa CKD:  - Cr slightly up, but stabilized on day of discharge.  - Avoid  nephrotoxins.    History of TIA/CVA:  - Continue statin. Pt hasn't been on aspirin since 2021.    OSA  - Continue CPAP qHS.   Class III obesity: Body mass index is 50.46 kg/m.    Vitamin B12 deficiency:  - Started IM supplementation here, convert to po at discharge.    HLD:  - Continue statin   Consultants: Cardiology Procedures performed: None  Disposition: Home Diet recommendation:  Cardiac and Carb modified diet DISCHARGE MEDICATION: Allergies as of 02/08/2024       Reactions   Amoxicillin Itching   Did it involve swelling of the face/tongue/throat, SOB, or low BP? No Did it involve sudden or severe rash/hives, skin peeling, or any reaction on the inside of your mouth or nose? No Did you need to seek medical attention at a hospital or doctor's office? No When did it last happen?      within the past 10 years If all above answers are "NO", may proceed with cephalosporin use.   Lisinopril Swelling   Angioedema    Hydrocodone Other (See Comments)   Depressed    Tegretol [carbamazepine] Swelling   Throat swells        Medication List     STOP taking these medications    ibuprofen 200 MG tablet Commonly known as: ADVIL   torsemide 10 MG tablet Commonly known as: DEMADEX       TAKE these medications    acetaminophen 325 MG tablet Commonly known as: TYLENOL Take 2 tablets (650 mg total) by mouth every 4 (four) hours as needed for mild pain (or temp > 37.5 C (99.5 F)).   ARIPiprazole 10 MG tablet Commonly known as: ABILIFY Take 0.5 tablets (5 mg total) by mouth daily.   atorvastatin 40 MG tablet Commonly known as: LIPITOR TAKE 1 TABLET (40 MG TOTAL) BY MOUTH DAILY AT 6 PM FOR CHOLESTEROL   carvedilol 6.25 MG tablet Commonly known as: COREG Take 6.25 mg by mouth 2 (two) times daily.   chlorhexidine 0.12 % solution Commonly known as: PERIDEX Use as directed 15 mLs in the mouth or throat daily.   cloBAZam 10 MG tablet Commonly known as: ONFI Take  10 mg by mouth at bedtime.   cyanocobalamin 1000 MCG tablet Commonly known as: VITAMIN B12 Take 1 tablet (1,000 mcg total) by mouth daily.   ferrous sulfate 325 (65 FE) MG tablet Take 325 mg by mouth daily with breakfast.   furosemide 80 MG tablet Commonly known as: LASIX Take 1 tablet (80 mg total) by mouth daily. Start taking on: February 09, 2024   glipiZIDE 10 MG tablet Commonly known as: GLUCOTROL Take 10 mg by mouth 2 (two) times daily.   hydrOXYzine 25 MG capsule Commonly known as: Vistaril 1 QHS  1 qday PRN   isosorbide-hydrALAZINE 20-37.5 MG tablet Commonly known as: BIDIL Take 0.5 tablets by mouth 3 (three) times daily.   lamoTRIgine 100 MG tablet Commonly known as: LAMICTAL TAKE 3 TABLETS (300 MG TOTAL) BY MOUTH 2 (TWO) TIMES DAILY. What changed: how much to take   sitaGLIPtin 100 MG tablet Commonly known as: JANUVIA Take 100 mg by mouth daily.   spironolactone 25 MG tablet Commonly known as:  ALDACTONE Take 0.5 tablets (12.5 mg total) by mouth daily. Start taking on: February 09, 2024   Vitamin D (Ergocalciferol) 1.25 MG (50000 UNIT) Caps capsule Commonly known as: DRISDOL Take 1 capsule (50,000 Units total) by mouth every 7 (seven) days.        Follow-up Information     [facility]. Go on 02/16/2024.   Specialty: Cardiology Why: Hospital follow up 02/16/2024 @ 11am PLEASE bring a current medication list to appoinment  FREE valet parking, Entrance C, off [employer] information: [address] [place] [address] [phone]        Loura Back, NP Follow up on 02/10/2024.   Specialty: Nurse Practitioner Why: 3:40 for hospital follow up Contact information: [address] [place] [address] [phone]                Discharge Exam: Filed Weights   02/06/24 0457 02/07/24 0436 02/08/24 0431  Weight: 129 kg 129.2 kg 127.9 kg  BP 120/82 (BP Location: Left Wrist)    Pulse 66   Temp 98 F (36.7 C) (Oral)   Resp 18   Ht 5\' 3"  (1.6 m)   Wt 127.9 kg   LMP  (LMP Unknown)   SpO2 91%   BMI 49.95 kg/m   Well-appearing obese female in no distress Clear, nonlabored RRR, no edema  Condition at discharge: stable  The results of significant diagnostics from this hospitalization (including imaging, microbiology, ancillary and laboratory) are listed below for reference.   Imaging Studies: ECHOCARDIOGRAM COMPLETE Result Date: 02/03/2024    ECHOCARDIOGRAM REPORT   Patient Name:   CANDANCE BOHLMAN Date of Exam: 02/03/2024 Medical Rec #:  [id number]          Height:       63.0 in Accession #:    [id number]         Weight:       297.6 lb Date of Birth:  04/04/1971          BSA:          2.289 m Patient Age:    52 years           BP:           127/81 mmHg Patient Gender: F                  HR:           73 bpm. Exam Location:  Inpatient Procedure: 2D Echo (Both Spectral and Color Flow Doppler were utilized during            procedure). Indications:    CHF  History:        Patient has prior history of Echocardiogram examinations, most                 recent 10/12/2019. Cardiomegaly, HFrEF and CHF, CKD3a, repiratory                 failure; seizure disorder; Risk Factors:Non-Smoker, Diabetes,                 Hypertension, Dyslipidemia and Sleep Apnea.  Sonographer:    Dondra Prader RVT RCS Referring Phys: [id number] TIMOTHY S OPYD  Sonographer Comments: Technically challenging study due to limited acoustic windows, Technically difficult study due to poor echo windows, suboptimal parasternal window and patient is obese. Image acquisition challenging due to patient body habitus and Coughing throughout exam. Definity not used due  to foreshortened images which were difficult to obtain. IMPRESSIONS  1. Left ventricular ejection fraction, by estimation, is 30 to 35%. The left ventricle has moderately decreased function. The left ventricle demonstrates global hypokinesis. The left  ventricular internal cavity size was mildly dilated. Left ventricular diastolic parameters are consistent with Grade II diastolic dysfunction (pseudonormalization).  2. Right ventricular systolic function is normal. The right ventricular size is normal. Tricuspid regurgitation signal is inadequate for assessing PA pressure.  3. Left atrial size was moderately dilated.  4. The mitral valve is normal in structure. Trivial mitral valve regurgitation. No evidence of mitral stenosis.  5. The aortic valve is tricuspid. Aortic valve regurgitation is not visualized. No aortic stenosis is present.  6. Aortic dilatation noted. There is borderline dilatation of the ascending aorta, measuring 37 mm.  7. The inferior vena cava is normal in size with <50% respiratory variability, suggesting right atrial pressure of 8 mmHg. FINDINGS  Left Ventricle: Left ventricular ejection fraction, by estimation, is 30 to 35%. The left ventricle has moderately decreased function. The left ventricle demonstrates global hypokinesis. Strain imaging was not performed. The left ventricular internal cavity size was mildly dilated. There is no left ventricular hypertrophy. Left ventricular diastolic parameters are consistent with Grade II diastolic dysfunction (pseudonormalization). Right Ventricle: The right ventricular size is normal. No increase in right ventricular wall thickness. Right ventricular systolic function is normal. Tricuspid regurgitation signal is inadequate for assessing PA pressure. Left Atrium: Left atrial size was moderately dilated. Right Atrium: Right atrial size was normal in size. Pericardium: Trivial pericardial effusion is present. Mitral Valve: The mitral valve is normal in structure. Trivial mitral valve regurgitation. No evidence of mitral valve stenosis. Tricuspid Valve: The tricuspid valve is normal in structure. Tricuspid valve regurgitation is not demonstrated. Aortic Valve: The aortic valve is tricuspid. Aortic valve  regurgitation is not visualized. No aortic stenosis is present. Aortic valve mean gradient measures 5.0 mmHg. Aortic valve peak gradient measures 9.1 mmHg. Aortic valve area, by VTI measures 2.15 cm. Pulmonic Valve: The pulmonic valve was normal in structure. Pulmonic valve regurgitation is not visualized. Aorta: Aortic dilatation noted and the aortic root is normal in size and structure. There is borderline dilatation of the ascending aorta, measuring 37 mm. Venous: The inferior vena cava is normal in size with less than 50% respiratory variability, suggesting right atrial pressure of 8 mmHg. IAS/Shunts: No atrial level shunt detected by color flow Doppler. Additional Comments: 3D imaging was not performed.  LEFT VENTRICLE PLAX 2D LVIDd:         6.10 cm   Diastology LVIDs:         4.70 cm   LV e' medial:    4.87 cm/s LV PW:         1.60 cm   LV E/e' medial:  17.2 LV IVS:        1.00 cm   LV e' lateral:   5.19 cm/s LVOT diam:     2.20 cm   LV E/e' lateral: 16.2 LV SV:         64 LV SV Index:   28 LVOT Area:     3.80 cm  RIGHT VENTRICLE             IVC RV S prime:     16.10 cm/s  IVC diam: 1.90 cm TAPSE (M-mode): 2.9 cm LEFT ATRIUM              Index  RIGHT ATRIUM           Index LA diam:        5.00 cm  2.18 cm/m   RA Area:     14.20 cm LA Vol (A2C):   62.4 ml  27.26 ml/m  RA Volume:   35.40 ml  15.46 ml/m LA Vol (A4C):   106.2 ml 46.41 ml/m LA Biplane Vol: 76.4 ml  33.37 ml/m  AORTIC VALVE                     PULMONIC VALVE AV Area (Vmax):    1.98 cm      PV Vmax:       0.71 m/s AV Area (Vmean):   1.81 cm      PV Peak grad:  2.0 mmHg AV Area (VTI):     2.15 cm AV Vmax:           151.00 cm/s AV Vmean:          105.000 cm/s AV VTI:            0.297 m AV Peak Grad:      9.1 mmHg AV Mean Grad:      5.0 mmHg LVOT Vmax:         78.80 cm/s LVOT Vmean:        50.100 cm/s LVOT VTI:          0.168 m LVOT/AV VTI ratio: 0.57  AORTA Ao Root diam: 3.00 cm Ao Asc diam:  3.70 cm MITRAL VALVE MV Area (PHT): 3.72  cm    SHUNTS MV Decel Time: 204 msec    Systemic VTI:  0.17 m MV E velocity: 83.90 cm/s  Systemic Diam: 2.20 cm MV A velocity: 59.20 cm/s MV E/A ratio:  1.42 Dalton McleanMD Electronically signed by Wilfred Lacy Signature Date/Time: 02/03/2024/1:32:50 PM    Final    CT Chest Wo Contrast Result Date: 02/02/2024 CLINICAL DATA:  Cough, shortness of breath, pneumonia EXAM: CT CHEST WITHOUT CONTRAST TECHNIQUE: Multidetector CT imaging of the chest was performed following the standard protocol without IV contrast. RADIATION DOSE REDUCTION: This exam was performed according to the departmental dose-optimization program which includes automated exposure control, adjustment of the mA and/or kV according to patient size and/or use of iterative reconstruction technique. COMPARISON:  Chest radiograph dated 02/02/2024 FINDINGS: Severely motion degraded imaging. Cardiovascular: Mild cardiomegaly.  No pericardial effusion. No evidence of thoracic aortic aneurysm. Mild atherosclerotic calcifications of the aortic arch. Mediastinum/Nodes: No suspicious mediastinal lymphadenopathy. Lungs/Pleura: Severe respiratory motion. Multifocal patchy/ground-glass opacities, right lung predominant. While poorly evaluated, this favors interstitial edema over multifocal pneumonia. Small right and trace left pleural effusions. No pneumothorax. Upper Abdomen: Visualized upper abdomen is grossly unremarkable. Musculoskeletal: Visualized osseous structures are within normal limits. IMPRESSION: Severely motion degraded imaging. Cardiomegaly with suspected interstitial edema, right lung predominant. Small bilateral pleural effusions. Aortic Atherosclerosis (ICD10-I70.0). Electronically Signed   By: Charline Bills M.D.   On: 02/02/2024 23:08   DG Chest 2 View Result Date: 02/02/2024 CLINICAL DATA:  Cough, hypoxia. EXAM: CHEST - 2 VIEW COMPARISON:  Radiographs 08/27/2021 and 10/31/2020.  CT 04/02/2005. FINDINGS: Progressive cardiac enlargement  with poor definition of the pulmonary vasculature and increased diffuse interstitial prominence, suspicious for pulmonary edema. No confluent airspace disease, significant pleural effusion or pneumothorax. The bones appear unchanged. IMPRESSION: Progressive cardiac enlargement with suspected pulmonary edema, suspicious for congestive heart. No confluent airspace disease or pleural effusion. Electronically Signed   By: [name]  Purcell Mouton M.D.   On: 02/02/2024 15:07    Microbiology: Results for orders placed or performed during the hospital encounter of 02/02/24  Culture, blood (Routine X 2) w Reflex to ID Panel     Status: Abnormal   Collection Time: 02/02/24  6:16 PM   Specimen: BLOOD RIGHT ARM  Result Value Ref Range Status   Specimen Description BLOOD RIGHT ARM  Final   Special Requests   Final    BOTTLES DRAWN AEROBIC ONLY Blood Culture results may not be optimal due to an inadequate volume of blood received in culture bottles   Culture  Setup Time   Final    GRAM POSITIVE COCCI IN CLUSTERS AEROBIC BOTTLE ONLY CRITICAL RESULT CALLED TO, READ BACK BY AND VERIFIED WITH: PHARMD LISA CURRAN ON 02/03/24 @ 2036 BY DRT    Culture (A)  Final    STAPHYLOCOCCUS HOMINIS THE SIGNIFICANCE OF ISOLATING THIS ORGANISM FROM A SINGLE SET OF BLOOD CULTURES WHEN MULTIPLE SETS ARE DRAWN IS UNCERTAIN. PLEASE NOTIFY THE MICROBIOLOGY DEPARTMENT WITHIN ONE WEEK IF SPECIATION AND SENSITIVITIES ARE REQUIRED. Performed at [facility], [address] [place], [place] [address]    Report Status 02/04/2024 FINAL  Final  Culture, blood (Routine X 2) w Reflex to ID Panel     Status: None   Collection Time: 02/02/24  6:16 PM   Specimen: BLOOD LEFT ARM  Result Value Ref Range Status   Specimen Description BLOOD LEFT ARM  Final   Special Requests   Final    BOTTLES DRAWN AEROBIC ONLY Blood Culture results may not be optimal due to an inadequate volume of blood received in culture bottles   Culture   Final    NO  GROWTH 5 DAYS Performed at [facility], [address], [place], [place] [address]    Report Status 02/07/2024 FINAL  Final  Blood Culture ID Panel (Reflexed)     Status: Abnormal   Collection Time: 02/02/24  6:16 PM  Result Value Ref Range Status   Enterococcus faecalis NOT DETECTED NOT DETECTED Final   Enterococcus Faecium NOT DETECTED NOT DETECTED Final   Listeria monocytogenes NOT DETECTED NOT DETECTED Final   Staphylococcus species DETECTED (A) NOT DETECTED Final    Comment: CRITICAL RESULT CALLED TO, READ BACK BY AND VERIFIED WITH: PHARMD LISA CURRAN ON 02/03/24 @ 2036 BY DRT    Staphylococcus aureus (BCID) NOT DETECTED NOT DETECTED Final   Staphylococcus epidermidis NOT DETECTED NOT DETECTED Final   Staphylococcus lugdunensis NOT DETECTED NOT DETECTED Final   Streptococcus species NOT DETECTED NOT DETECTED Final   Streptococcus agalactiae NOT DETECTED NOT DETECTED Final   Streptococcus pneumoniae NOT DETECTED NOT DETECTED Final   Streptococcus pyogenes NOT DETECTED NOT DETECTED Final   A.calcoaceticus-baumannii NOT DETECTED NOT DETECTED Final   Bacteroides fragilis NOT DETECTED NOT DETECTED Final   Enterobacterales NOT DETECTED NOT DETECTED Final   Enterobacter cloacae complex NOT DETECTED NOT DETECTED Final   Escherichia coli NOT DETECTED NOT DETECTED Final   Klebsiella aerogenes NOT DETECTED NOT DETECTED Final   Klebsiella oxytoca NOT DETECTED NOT DETECTED Final   Klebsiella pneumoniae NOT DETECTED NOT DETECTED Final   Proteus species NOT DETECTED NOT DETECTED Final   Salmonella species NOT DETECTED NOT DETECTED Final   Serratia marcescens NOT DETECTED NOT DETECTED Final   Haemophilus influenzae NOT DETECTED NOT DETECTED Final   Neisseria meningitidis NOT DETECTED NOT DETECTED Final   Pseudomonas aeruginosa NOT DETECTED NOT DETECTED Final   Stenotrophomonas  maltophilia NOT DETECTED NOT DETECTED Final   Candida albicans NOT DETECTED NOT DETECTED Final   Candida  auris NOT DETECTED NOT DETECTED Final   Candida glabrata NOT DETECTED NOT DETECTED Final   Candida krusei NOT DETECTED NOT DETECTED Final   Candida parapsilosis NOT DETECTED NOT DETECTED Final   Candida tropicalis NOT DETECTED NOT DETECTED Final   Cryptococcus neoformans/gattii NOT DETECTED NOT DETECTED Final    Comment: Performed at [facility], [address] [place], [place] [address]   *Note: Due to a large number of results and/or encounters for the requested time period, some results have not been displayed. A complete set of results can be found in Results Review.    Labs: CBC: Recent Labs  Lab 02/02/24 1816 02/03/24 0523 02/04/24 0254  WBC 8.3 10.8* 7.4  NEUTROABS 5.4  --   --   HGB 10.9* 11.1* 10.4*  HCT 35.0* 35.6* 32.9*  MCV 96.7 95.4 94.3  PLT 281 248 271   Basic Metabolic Panel: Recent Labs  Lab 02/02/24 1816 02/03/24 0523 02/03/24 0526 02/04/24 0254 02/05/24 0309 02/06/24 0240 02/07/24 0800 02/08/24 0828  NA 137 137  --  139 137 139 138 136  K 4.2 3.7  --  3.5 3.5 3.9 4.2 3.9  CL 103 103  --  100 96* 96* 96* 94*  CO2 24 27  --  28 28 26 30 27   GLUCOSE 118* 156*  --  148* 142* 142* 194* 167*  BUN 11 12  --  15 12 20  22* 24*  CREATININE 1.27* 1.32*  --  1.49* 1.44* 1.67* 1.75* 1.65*  CALCIUM 8.6* 8.5*  --  8.6* 8.7* 9.1 9.5 9.4  MG 2.0 1.9  --  1.9  --   --   --   --   PHOS  --   --  4.8* 4.4  --   --   --   --    Liver Function Tests: Recent Labs  Lab 02/02/24 1816  AST 18  ALT 18  ALKPHOS 79  BILITOT 0.6  PROT 7.7  ALBUMIN 3.4*   CBG: Recent Labs  Lab 02/07/24 0804 02/07/24 1153 02/07/24 1600 02/07/24 2040 02/08/24 0528  GLUCAP 200* 184* 162* 198* 144*    Discharge time spent: greater than 30 minutes.  Signed: Tyrone Nine, MD [facility] 02/08/2024

## 2024-02-08 NOTE — Plan of Care (Signed)

## 2024-02-10 ENCOUNTER — Telehealth: Payer: Self-pay

## 2024-02-10 ENCOUNTER — Telehealth (HOSPITAL_COMMUNITY): Payer: Self-pay

## 2024-02-10 NOTE — Telephone Encounter (Signed)
 Spoke with patient- moved patients TOC follow up to 330 on 3/10. Patient aware of appointment time and date, and location. She verbalized understanding of all instructions.   Advised patient to call back to office with any issues, questions, or concerns. Patient verbalized understanding.

## 2024-02-10 NOTE — Telephone Encounter (Signed)
 Called patient left message on personal voice mail I received a message from Echo scheduler you just had a echo done at [facility] hospital.Your 3/12 echo appointment cancelled.Dr.Jordan is out of office this week.I will show him recent echo on Mon 3/10 and call you back with results.

## 2024-02-13 ENCOUNTER — Telehealth: Payer: Self-pay | Admitting: Neurology

## 2024-02-13 NOTE — Telephone Encounter (Signed)
 Pt called stating that she is thinking that she needs her machine checked. She also has been in the hospital with heart failure and she is needing to discuss with MD. Please advise.

## 2024-02-16 ENCOUNTER — Telehealth (HOSPITAL_BASED_OUTPATIENT_CLINIC_OR_DEPARTMENT_OTHER): Payer: Self-pay

## 2024-02-16 ENCOUNTER — Other Ambulatory Visit (HOSPITAL_COMMUNITY): Payer: Self-pay

## 2024-02-16 ENCOUNTER — Ambulatory Visit (HOSPITAL_COMMUNITY)
Admission: RE | Admit: 2024-02-16 | Discharge: 2024-02-16 | Disposition: A | Discharge status: Home / Self Care | Source: Ambulatory Visit | Attending: Physician Assistant | Admitting: Physician Assistant

## 2024-02-16 ENCOUNTER — Encounter (HOSPITAL_COMMUNITY): Payer: Medicare HMO

## 2024-02-16 VITALS — BP 120/74 | HR 86 | Wt 288.6 lb

## 2024-02-16 DIAGNOSIS — T783XXD Angioneurotic edema, subsequent encounter: Secondary | ICD-10-CM | POA: Diagnosis not present

## 2024-02-16 DIAGNOSIS — I130 Hypertensive heart and chronic kidney disease with heart failure and stage 1 through stage 4 chronic kidney disease, or unspecified chronic kidney disease: Secondary | ICD-10-CM | POA: Diagnosis not present

## 2024-02-16 DIAGNOSIS — Z556 Problems related to health literacy: Secondary | ICD-10-CM | POA: Diagnosis not present

## 2024-02-16 DIAGNOSIS — F0634 Mood disorder due to known physiological condition with mixed features: Secondary | ICD-10-CM | POA: Insufficient documentation

## 2024-02-16 DIAGNOSIS — E1122 Type 2 diabetes mellitus with diabetic chronic kidney disease: Secondary | ICD-10-CM | POA: Diagnosis not present

## 2024-02-16 DIAGNOSIS — R9431 Abnormal electrocardiogram [ECG] [EKG]: Secondary | ICD-10-CM | POA: Diagnosis not present

## 2024-02-16 DIAGNOSIS — I69311 Memory deficit following cerebral infarction: Secondary | ICD-10-CM | POA: Insufficient documentation

## 2024-02-16 DIAGNOSIS — T464X5D Adverse effect of angiotensin-converting-enzyme inhibitors, subsequent encounter: Secondary | ICD-10-CM | POA: Diagnosis not present

## 2024-02-16 DIAGNOSIS — I5022 Chronic systolic (congestive) heart failure: Secondary | ICD-10-CM | POA: Diagnosis present

## 2024-02-16 DIAGNOSIS — G40909 Epilepsy, unspecified, not intractable, without status epilepticus: Secondary | ICD-10-CM | POA: Diagnosis not present

## 2024-02-16 DIAGNOSIS — I69398 Other sequelae of cerebral infarction: Secondary | ICD-10-CM | POA: Insufficient documentation

## 2024-02-16 DIAGNOSIS — Z6841 Body Mass Index (BMI) 40.0 and over, adult: Secondary | ICD-10-CM | POA: Diagnosis not present

## 2024-02-16 DIAGNOSIS — I5042 Chronic combined systolic (congestive) and diastolic (congestive) heart failure: Secondary | ICD-10-CM

## 2024-02-16 DIAGNOSIS — G4733 Obstructive sleep apnea (adult) (pediatric): Secondary | ICD-10-CM | POA: Diagnosis not present

## 2024-02-16 DIAGNOSIS — N1831 Chronic kidney disease, stage 3a: Secondary | ICD-10-CM | POA: Diagnosis present

## 2024-02-16 DIAGNOSIS — I5020 Unspecified systolic (congestive) heart failure: Secondary | ICD-10-CM

## 2024-02-16 MED ORDER — LOSARTAN POTASSIUM 25 MG PO TABS: 25.0000 mg | ORAL_TABLET | Freq: Every day | ORAL | 3 refills | Status: DC

## 2024-02-16 NOTE — H&P (View-Only) (Signed)
 HEART & VASCULAR TRANSITION OF CARE CONSULT NOTE     Referring Physician: Dr. Jarvis Newcomer PCP: Loura Back, NP  Cardiologist: Dr. Duke Salvia  Chief Complaint: Chronic HFrEF  HPI: Referred to clinic by Dr. Jarvis Newcomer with [facility] for heart failure consultation. 53 y.o. female with history of CKD III, chronic HFrEF, morbid obesity, OSA on CPAP, hx CVA (felt to be cardioembolic), mood disorder 2/2 CVA, epilepsy, angioedema d/t ACEI.  HF dates back to 2018. EF had been in range of 35-40%. Had not seen Cardiology since 2020.  Presented with acute respiratory failure 2/2 acute on chronic HFrEF on 02/02/24. Echo with EF 30-35%, grade II DD, RV okay. Cardiology consulted. She was diuresed with IV lasix. GDMT titrated. Had mild troponin elevation with flat trend. Ischemic workup deferred to outpatient setting. Grew Staph hominis in blood culture which was felt to be contaminant.  She is here today for HF follow-up. She is accompanied by her boyfriend of 32 years. Their 7 year old daughter assists her with medications. She has short-term memory trouble and notes trouble managing medications on her own. Patient gets winded easily with exertion. Fairly sedentary at home. No orthopnea, PND or lower extremity edema. Her boyfriend mentions she had been eating a lot of fatty, high-sodium foods. He's been assisting her with eating better. Drinks a lot of soda.   No ETOH, tobacco or drug use.   She is on disability for bipolar disorder.   Past Medical History:  Diagnosis Date   Anemia    Anxiety    Bipolar 1 disorder (HCC)    Common migraine 05/19/2015   Depression    Diabetes mellitus, type II (HCC)    Hypertension    Irritable bowel syndrome (IBS)    Mild mental retardation    Obesity    Partial complex seizure disorder with intractable epilepsy (HCC) 05/12/2014   Seizures (HCC)    intractable, sz 08/23/17   Sleep apnea    Stroke (HCC)    Type II or unspecified type diabetes mellitus without mention of  complication, not stated as uncontrolled     Current Outpatient Medications  Medication Sig Dispense Refill   acetaminophen (TYLENOL) 325 MG tablet Take 2 tablets (650 mg total) by mouth every 4 (four) hours as needed for mild pain (or temp > 37.5 C (99.5 F)).     ARIPiprazole (ABILIFY) 10 MG tablet Take 0.5 tablets (5 mg total) by mouth daily. 30 tablet 2   atorvastatin (LIPITOR) 40 MG tablet TAKE 1 TABLET (40 MG TOTAL) BY MOUTH DAILY AT 6 PM FOR CHOLESTEROL 90 tablet 0   carvedilol (COREG) 6.25 MG tablet Take 6.25 mg by mouth 2 (two) times daily.     chlorhexidine (PERIDEX) 0.12 % solution Use as directed 15 mLs in the mouth or throat daily.     cloBAZam (ONFI) 10 MG tablet Take 10 mg by mouth at bedtime.     cyanocobalamin (VITAMIN B12) 1000 MCG tablet Take 1 tablet (1,000 mcg total) by mouth daily. 90 tablet 0   ferrous sulfate 325 (65 FE) MG tablet Take 325 mg by mouth daily with breakfast.     furosemide (LASIX) 80 MG tablet Take 1 tablet (80 mg total) by mouth daily. 30 tablet 0   glipiZIDE (GLUCOTROL) 10 MG tablet Take 10 mg by mouth 2 (two) times daily.     hydrOXYzine (VISTARIL) 25 MG capsule 1 QHS  1 qday PRN 60 capsule 5   isosorbide-hydrALAZINE (BIDIL) 20-37.5 MG tablet  Take 0.5 tablets by mouth 3 (three) times daily. 45 tablet 0   lamoTRIgine (LAMICTAL) 100 MG tablet TAKE 3 TABLETS (300 MG TOTAL) BY MOUTH 2 (TWO) TIMES DAILY. (Patient taking differently: Take 200 mg by mouth 2 (two) times daily.) 540 tablet 1   losartan (COZAAR) 25 MG tablet Take 1 tablet (25 mg total) by mouth daily. 90 tablet 3   sitaGLIPtin (JANUVIA) 100 MG tablet Take 100 mg by mouth daily.     spironolactone (ALDACTONE) 25 MG tablet Take 0.5 tablets (12.5 mg total) by mouth daily. 15 tablet 0   Vitamin D, Ergocalciferol, (DRISDOL) 1.25 MG (50000 UNIT) CAPS capsule Take 1 capsule (50,000 Units total) by mouth every 7 (seven) days. 12 capsule 0   No current facility-administered medications for this encounter.     Allergies  Allergen Reactions   Amoxicillin Itching    Did it involve swelling of the face/tongue/throat, SOB, or low BP? No Did it involve sudden or severe rash/hives, skin peeling, or any reaction on the inside of your mouth or nose? No Did you need to seek medical attention at a hospital or doctor's office? No When did it last happen?      within the past 10 years If all above answers are "NO", may proceed with cephalosporin use.    Lisinopril Swelling    Angioedema    Hydrocodone Other (See Comments)    Depressed    Tegretol [Carbamazepine] Swelling    Throat swells      Social History   Socioeconomic History   Marital status: Single    Spouse name: [name]   Number of children: 3   Years of education: 12   Highest education level: Not on file  Occupational History   Occupation: disabled    Employer: DISABLED  Tobacco Use   Smoking status: Never   Smokeless tobacco: Never  Vaping Use   Vaping status: Never Used  Substance and Sexual Activity   Alcohol use: No   Drug use: No   Sexual activity: Yes  Other Topics Concern   Not on file  Social History Narrative   Patient lives at home with daughter.    Patient has 3 children.    Patient is right handed.    Patient has a high school education.    Patient is on disability   Patient drinks 2 cups of caffeine daily.   Social Drivers of Corporate investment banker Strain: Low Risk  (02/04/2024)   Overall Financial Resource Strain (CARDIA)    Difficulty of Paying Living Expenses: Not very hard  Food Insecurity: No Food Insecurity (02/03/2024)   Hunger Vital Sign    Worried About Running Out of Food in the Last Year: Never true    Ran Out of Food in the Last Year: Never true  Transportation Needs: No Transportation Needs (02/03/2024)   PRAPARE - Administrator, Civil Service (Medical): No    Lack of Transportation (Non-Medical): No  Physical Activity: Not on file  Stress: Not on file  Social  Connections: Not on file  Intimate Partner Violence: Not At Risk (02/03/2024)   Humiliation, Afraid, Rape, and Kick questionnaire    Fear of Current or Ex-Partner: No    Emotionally Abused: No    Physically Abused: No    Sexually Abused: No      Family History  Problem Relation Age of Onset   Diabetes Mother        passed away from accidental  death   Hypertension Mother    Bipolar disorder Father    Diabetes Daughter    Leukemia Daughter    Ovarian cancer Maternal Aunt     Vitals:   02/16/24 1510  BP: 120/74  Pulse: 86  SpO2: 97%  Weight: 130.9 kg (288 lb 9.6 oz)    PHYSICAL EXAM: General:  Well appearing. No respiratory difficulty HEENT: normal Neck: supple. no JVD. Carotids 2+ bilat; no bruits. No lymphadenopathy or thryomegaly appreciated. Cor: PMI nondisplaced. Regular rate & rhythm. No rubs, gallops or murmurs. Lungs: clear Abdomen: soft, nontender, nondistended. No hepatosplenomegaly. No bruits or masses. Good bowel sounds. Extremities: no cyanosis, clubbing, rash, edema Neuro: alert & oriented x 3, cranial nerves grossly intact. moves all 4 extremities w/o difficulty. Affect pleasant.  ECG:   ASSESSMENT & PLAN: HFrEF -EF 40-45% in 2018 -Echo 2020 EF 35-40% -Echo 02/25: EF 30-35%, grade II DD, RV okay, moderate LAE -Etiology not certain. She has not had ischemic workup or cardiac MRI. Will arrange R/LHC. Body habitus prohibits coronary CTA. -NYHA III. Volume looks good on exam. Continue 80 mg furosemide daily -Continue Coreg 6.25 mg BID -Continue bidil /12 tab TID -Continue spiro 12.5 mg daily -Start losartan 25 mg daily, no ACE or ARNi with history of angioedema on ACE. Titrate bidil off as losartan and spiro are adjusted.  -No SGLT2i for now with frequent UTIs, would like to trial in future -Has OSA and uses CPAP. Planning for repeat sleep study soon. -Will request BMET add on for f/u with Cardiology in 2 weeks. -Cardiac rehab referral  CKD  IIIa -Baseline Scr 1.3-1.5 -Check labs today  DM II -A1c 7.3% -On oral agents -Continue statin  Morbid obesity - BMI > 50 -Consider referral for GLP1 RA next visit  OSA - Uses CPAP. Followed by Neuro - Has another sleep study soon  Hx CVA - thought to be cardioembolic - Loop recorder placed in 2018. No Afib discovered.   SDOH: Poor health literacy and trouble managing medications d/t memory impairment. Her daughter helps with medications but this can be challenging. Does not qualify for paramedicine. HF PharmD assisted with education today.    Referred to HFSW (PCP, Medications, Transportation, ETOH Abuse, Drug Abuse, Insurance, Financial ): No Refer to Pharmacy: Yes, review medications. HF Pharm D/resident reviewed medications with patient and significant other. Provided weekly pill organizer. Refer to Home Health: No Refer to Advanced Heart Failure Clinic: Yes Refer to General Cardiology: No, already established  Follow up 03/24 with Caridology as scheduled then Dr. Elwyn Lade in 4 weeks to establish care

## 2024-02-16 NOTE — Telephone Encounter (Addendum)
 Reached out to patient at request of NP, message relayed, patient verbalizes understanding!    ----- Message from Alver Sorrow sent at 02/16/2024  4:34 PM EDT ----- I certainly can! I set myself a reminder and added it to her appt notes. Went ahead and cancelled the 3/13 OV - Kynlei Piontek, will you please call her to let her know? Thank you! ----- Message ----- From: Levonne Spiller, RN Sent: 02/16/2024   3:57 PM EDT To: Alver Sorrow, NP; #  Unice Cobble, PA saw this patient in [facility] today at [facility]. Can you check BMP at patient visit on 3/24? Also, we will ask her to cancel her appt with you guys on 3/13. Planned R and L cardiac cath first week of April. In process of scheduling. Thank you

## 2024-02-16 NOTE — Telephone Encounter (Signed)
 Pt just wanted a call to make sure we knew she was having sleep study tomorrow night. I voiced gratitude and understanding of her being informative regarding sleep study time

## 2024-02-16 NOTE — Progress Notes (Signed)
 [facility] Transitions of Care Clinic Heart Failure Pharmacist Encounter  PCP: Loura Back, NP PCP-Cardiologist: Chilton Si, MD  HPI:   Brandi Gomez is a 53 y.o. female with PMHx of HFrEF (LVEF 30-35%), T2DM, CVA, obesity, bipolar disorder, seizures, OSA, CKD (stage IIIa), and HTN.  She presented to to [facility] ED on 02/02/24 with primary complaint of SOB. The patient reported increased DOE for the past week. She has had a nonproductive cough as well. She denied any chest pain or palpitations. She reports no changes to her already present LEE. Vitals upon arrival was afebrile, SpO2 96% on HFNC 5 L of O2, BP 146/102 mmHg, and HR 71 bpm. Labs obtained WBC 8.3, Scr 1.27 (BL ~1.1), BUN 11, K 4.2, Na 137, flat troponins, BNP 274. CXR showed progressive cardiac enlargement with suspected pulmonary edema. She was given furosemide IV and resumed on carvedilol. ECHO 02/03/24 LVEF 30-35%, LV GHK, RV normal, grade 2 DD. Of note, patient has history of angioedema with Lisinopril.    Today, Brandi Gomez presents to the Heart Failure [facility] for follow up. She is accompanied by a friend today. She denied SOB, DOE, lightheadedness/dizziness. She admitted to some orthopnea and bilateral lower extremity edema, but not worse than her usual. She tries to stay on a low salt diet, but does drink dark sodas frequently. She requested an updated list of her medications and a new pillbox. This was provided in clinic today. Due to issues with her memory she has a loved one help her remember to take her medications. She is agreeable to start Losartan. Per her companion when she experienced angioedema from Lisinopril she did require ventilation.    HF Medications: Diuretic: Lasix 80 mg QD Beta Blocker: Carvedilol 6.25 mg BID MRA: Spironolactone 12.5 mg QD Other: BiDil one-half tablet TID   Has the patient been experiencing any side effects to the medications prescribed?  no  Does the patient have  any problems obtaining medications due to transportation or finances?   no  Understanding of regimen: fair Understanding of indications: good Potential of compliance: good Patient understands to avoid NSAIDs. Patient understands to avoid decongestants.   Pertinent Lab Values: Serum creatinine 1.65, BUN 24, Potassium 3.9, Sodium 136, BNP 273.5, Magnesium 1.9, A1c 7.3%  Vital Signs: Weight: 288.6 lbs (discharge weight: 282 lbs) Blood pressure: 120/74  Heart rate: 86   Medication Assistance / Insurance Benefits Check: Does the patient have prescription insurance?  Yes Type of insurance plan: [employer] Medicare/[place] Medicaid   Outpatient Pharmacy:  Current outpatient pharmacy: [facility] [phone] - [place], [place] - [address]  Was the [facility] pharmacy used to supply discharge medications? no  Is the patient willing to transition their outpatient pharmacy to utilize a [facility] outpatient pharmacy with or without mail order?   No Discussed pill-packing services available through [facility]. Patient stated she will think about it.   Assessment: 1) Chronic systolic CHF (EF 84-16%). NYHA class II symptoms. - Continue daily weights and low salt diet. Patient was advised to avoid dark-colored sodas due to sodium content -Patient requested updated medication list and pill box -Start Losartan 25 mg daily  -Continue Lasix 80 mg daily -Continue Coreg 6.25 mg daily -Continue Spironolactone 12.5 mg daily -Continue BiDil one-tablet TID   Plan: 1) Medication changes: -Start Losartan 25 mg daily -Given an updated list of her medications and pill box  2) Patient Assistance: -Ran test claim for isosorbide-hydralazine (Bidil)  20-37.5 mg and the current 30 day co-pay is $0.00   3) Follow up: -Cardiac catheterization ordered. She will follow up with Dr. Elwyn Lade in this regard  Sofie Rower, PharmD [facility] PGY-1

## 2024-02-16 NOTE — Addendum Note (Signed)
 Encounter addended by: Andrey Farmer, PA-C on: 02/16/2024 6:13 PM  Actions taken: Clinical Note Signed

## 2024-02-16 NOTE — Progress Notes (Addendum)
 HEART & VASCULAR TRANSITION OF CARE CONSULT NOTE     Referring Physician: Dr. Jarvis Newcomer PCP: Loura Back, NP  Cardiologist: Dr. Duke Salvia  Chief Complaint: Chronic HFrEF  HPI: Referred to clinic by Dr. Jarvis Newcomer with [facility] for heart failure consultation. 53 y.o. female with history of CKD III, chronic HFrEF, morbid obesity, OSA on CPAP, hx CVA (felt to be cardioembolic), mood disorder 2/2 CVA, epilepsy, angioedema d/t ACEI.  HF dates back to 2018. EF had been in range of 35-40%. Had not seen Cardiology since 2020.  Presented with acute respiratory failure 2/2 acute on chronic HFrEF on 02/02/24. Echo with EF 30-35%, grade II DD, RV okay. Cardiology consulted. She was diuresed with IV lasix. GDMT titrated. Had mild troponin elevation with flat trend. Ischemic workup deferred to outpatient setting. Grew Staph hominis in blood culture which was felt to be contaminant.  She is here today for HF follow-up. She is accompanied by her boyfriend of 32 years. Their 7 year old daughter assists her with medications. She has short-term memory trouble and notes trouble managing medications on her own. Patient gets winded easily with exertion. Fairly sedentary at home. No orthopnea, PND or lower extremity edema. Her boyfriend mentions she had been eating a lot of fatty, high-sodium foods. He's been assisting her with eating better. Drinks a lot of soda.   No ETOH, tobacco or drug use.   She is on disability for bipolar disorder.   Past Medical History:  Diagnosis Date   Anemia    Anxiety    Bipolar 1 disorder (HCC)    Common migraine 05/19/2015   Depression    Diabetes mellitus, type II (HCC)    Hypertension    Irritable bowel syndrome (IBS)    Mild mental retardation    Obesity    Partial complex seizure disorder with intractable epilepsy (HCC) 05/12/2014   Seizures (HCC)    intractable, sz 08/23/17   Sleep apnea    Stroke (HCC)    Type II or unspecified type diabetes mellitus without mention of  complication, not stated as uncontrolled     Current Outpatient Medications  Medication Sig Dispense Refill   acetaminophen (TYLENOL) 325 MG tablet Take 2 tablets (650 mg total) by mouth every 4 (four) hours as needed for mild pain (or temp > 37.5 C (99.5 F)).     ARIPiprazole (ABILIFY) 10 MG tablet Take 0.5 tablets (5 mg total) by mouth daily. 30 tablet 2   atorvastatin (LIPITOR) 40 MG tablet TAKE 1 TABLET (40 MG TOTAL) BY MOUTH DAILY AT 6 PM FOR CHOLESTEROL 90 tablet 0   carvedilol (COREG) 6.25 MG tablet Take 6.25 mg by mouth 2 (two) times daily.     chlorhexidine (PERIDEX) 0.12 % solution Use as directed 15 mLs in the mouth or throat daily.     cloBAZam (ONFI) 10 MG tablet Take 10 mg by mouth at bedtime.     cyanocobalamin (VITAMIN B12) 1000 MCG tablet Take 1 tablet (1,000 mcg total) by mouth daily. 90 tablet 0   ferrous sulfate 325 (65 FE) MG tablet Take 325 mg by mouth daily with breakfast.     furosemide (LASIX) 80 MG tablet Take 1 tablet (80 mg total) by mouth daily. 30 tablet 0   glipiZIDE (GLUCOTROL) 10 MG tablet Take 10 mg by mouth 2 (two) times daily.     hydrOXYzine (VISTARIL) 25 MG capsule 1 QHS  1 qday PRN 60 capsule 5   isosorbide-hydrALAZINE (BIDIL) 20-37.5 MG tablet  Take 0.5 tablets by mouth 3 (three) times daily. 45 tablet 0   lamoTRIgine (LAMICTAL) 100 MG tablet TAKE 3 TABLETS (300 MG TOTAL) BY MOUTH 2 (TWO) TIMES DAILY. (Patient taking differently: Take 200 mg by mouth 2 (two) times daily.) 540 tablet 1   losartan (COZAAR) 25 MG tablet Take 1 tablet (25 mg total) by mouth daily. 90 tablet 3   sitaGLIPtin (JANUVIA) 100 MG tablet Take 100 mg by mouth daily.     spironolactone (ALDACTONE) 25 MG tablet Take 0.5 tablets (12.5 mg total) by mouth daily. 15 tablet 0   Vitamin D, Ergocalciferol, (DRISDOL) 1.25 MG (50000 UNIT) CAPS capsule Take 1 capsule (50,000 Units total) by mouth every 7 (seven) days. 12 capsule 0   No current facility-administered medications for this encounter.     Allergies  Allergen Reactions   Amoxicillin Itching    Did it involve swelling of the face/tongue/throat, SOB, or low BP? No Did it involve sudden or severe rash/hives, skin peeling, or any reaction on the inside of your mouth or nose? No Did you need to seek medical attention at a hospital or doctor's office? No When did it last happen?      within the past 10 years If all above answers are "NO", may proceed with cephalosporin use.    Lisinopril Swelling    Angioedema    Hydrocodone Other (See Comments)    Depressed    Tegretol [Carbamazepine] Swelling    Throat swells      Social History   Socioeconomic History   Marital status: Single    Spouse name: [name]   Number of children: 3   Years of education: 12   Highest education level: Not on file  Occupational History   Occupation: disabled    Employer: DISABLED  Tobacco Use   Smoking status: Never   Smokeless tobacco: Never  Vaping Use   Vaping status: Never Used  Substance and Sexual Activity   Alcohol use: No   Drug use: No   Sexual activity: Yes  Other Topics Concern   Not on file  Social History Narrative   Patient lives at home with daughter.    Patient has 3 children.    Patient is right handed.    Patient has a high school education.    Patient is on disability   Patient drinks 2 cups of caffeine daily.   Social Drivers of Corporate investment banker Strain: Low Risk  (02/04/2024)   Overall Financial Resource Strain (CARDIA)    Difficulty of Paying Living Expenses: Not very hard  Food Insecurity: No Food Insecurity (02/03/2024)   Hunger Vital Sign    Worried About Running Out of Food in the Last Year: Never true    Ran Out of Food in the Last Year: Never true  Transportation Needs: No Transportation Needs (02/03/2024)   PRAPARE - Administrator, Civil Service (Medical): No    Lack of Transportation (Non-Medical): No  Physical Activity: Not on file  Stress: Not on file  Social  Connections: Not on file  Intimate Partner Violence: Not At Risk (02/03/2024)   Humiliation, Afraid, Rape, and Kick questionnaire    Fear of Current or Ex-Partner: No    Emotionally Abused: No    Physically Abused: No    Sexually Abused: No      Family History  Problem Relation Age of Onset   Diabetes Mother        passed away from accidental  death   Hypertension Mother    Bipolar disorder Father    Diabetes Daughter    Leukemia Daughter    Ovarian cancer Maternal Aunt     Vitals:   02/16/24 1510  BP: 120/74  Pulse: 86  SpO2: 97%  Weight: 130.9 kg (288 lb 9.6 oz)    PHYSICAL EXAM: General:  Well appearing. No respiratory difficulty HEENT: normal Neck: supple. no JVD. Carotids 2+ bilat; no bruits. No lymphadenopathy or thryomegaly appreciated. Cor: PMI nondisplaced. Regular rate & rhythm. No rubs, gallops or murmurs. Lungs: clear Abdomen: soft, nontender, nondistended. No hepatosplenomegaly. No bruits or masses. Good bowel sounds. Extremities: no cyanosis, clubbing, rash, edema Neuro: alert & oriented x 3, cranial nerves grossly intact. moves all 4 extremities w/o difficulty. Affect pleasant.  ECG:   ASSESSMENT & PLAN: HFrEF -EF 40-45% in 2018 -Echo 2020 EF 35-40% -Echo 02/25: EF 30-35%, grade II DD, RV okay, moderate LAE -Etiology not certain. She has not had ischemic workup or cardiac MRI. Will arrange R/LHC. Body habitus prohibits coronary CTA. -NYHA III. Volume looks good on exam. Continue 80 mg furosemide daily -Continue Coreg 6.25 mg BID -Continue bidil /12 tab TID -Continue spiro 12.5 mg daily -Start losartan 25 mg daily, no ACE or ARNi with history of angioedema on ACE. Titrate bidil off as losartan and spiro are adjusted.  -No SGLT2i for now with frequent UTIs, would like to trial in future -Has OSA and uses CPAP. Planning for repeat sleep study soon. -Will request BMET add on for f/u with Cardiology in 2 weeks. -Cardiac rehab referral  CKD  IIIa -Baseline Scr 1.3-1.5 -Check labs today  DM II -A1c 7.3% -On oral agents -Continue statin  Morbid obesity - BMI > 50 -Consider referral for GLP1 RA next visit  OSA - Uses CPAP. Followed by Neuro - Has another sleep study soon  Hx CVA - thought to be cardioembolic - Loop recorder placed in 2018. No Afib discovered.   SDOH: Poor health literacy and trouble managing medications d/t memory impairment. Her daughter helps with medications but this can be challenging. Does not qualify for paramedicine. HF PharmD assisted with education today.    Referred to HFSW (PCP, Medications, Transportation, ETOH Abuse, Drug Abuse, Insurance, Financial ): No Refer to Pharmacy: Yes, review medications. HF Pharm D/resident reviewed medications with patient and significant other. Provided weekly pill organizer. Refer to Home Health: No Refer to Advanced Heart Failure Clinic: Yes Refer to General Cardiology: No, already established  Follow up 03/24 with Caridology as scheduled then Dr. Elwyn Lade in 4 weeks to establish care

## 2024-02-16 NOTE — Patient Instructions (Signed)
 Start Losartan 25 mg daily - Rx sent. Keep your scheduled appointment with General Cardiology on 03/01/24. Will ask them to get lab for [place]. You may cancel your General Cardiology appointment on 02/19/24. Cardiac cath has been scheduled - see below. Return to see Dr. Elwyn Lade in [facility] in 4 weeks - see below. Please call us at [phone] if any questions or concerns prior to your next appointment.     [facility] [address] [place] [address] Dept: [phone] Loc: [phone]  MADYSYN HANKEN  02/16/2024  You are scheduled for a Cardiac Catheterization on Monday, March 31 with Dr. Dr. Elwyn Lade.  1. Please arrive at the [facility] (Main Entrance A) at [facility]: [address] [place], [place] [address] at 7:00 AM (This time is 2 hour(s) before your procedure to ensure your preparation).   Free valet parking service is available. You will check in at ADMITTING. The support person will be asked to wait in the waiting room.  It is OK to have someone drop you off and come back when you are ready to be discharged.    Special note: Every effort is made to have your procedure done on time. Please understand that emergencies sometimes delay scheduled procedures.  2. Diet: Do not eat solid foods after midnight.  The patient may have clear liquids until 5am upon the day of the procedure.  3. Labs: You will need to have blood drawn on 02/22/24 at your General Cardiology appointment.     Contrast Allergy: No   4. Medication instructions in preparation for your procedure:  Hold following medications morning of procedure:  Glipizide Januvia Spironolactone Lasix

## 2024-02-17 ENCOUNTER — Telehealth: Payer: Self-pay | Admitting: Neurology

## 2024-02-17 NOTE — Telephone Encounter (Signed)
 Patient Brandi Gomez was scheduled for 02/17/24. She just called and needed to cancel it due to she has an abscess tooth and can't get air in it right now. She stated she will call back to scheduled once she gets her tooth fix.

## 2024-02-18 ENCOUNTER — Inpatient Hospital Stay (HOSPITAL_COMMUNITY): Admission: RE | Admit: 2024-02-18 | Payer: Medicare HMO | Source: Ambulatory Visit

## 2024-02-19 ENCOUNTER — Telehealth: Payer: Self-pay

## 2024-02-19 ENCOUNTER — Ambulatory Visit: Payer: Medicare HMO | Admitting: Nurse Practitioner

## 2024-02-19 NOTE — Telephone Encounter (Signed)
 Spoke to patient advised to keep post hospital follow up appointment with Gillian Shields NP 3/24 at 1:55 pm at [facility] office.Directions given.

## 2024-02-22 ENCOUNTER — Emergency Department (HOSPITAL_COMMUNITY)
Admission: EM | Admit: 2024-02-22 | Discharge: 2024-02-22 | Disposition: A | Discharge status: Home / Self Care | Attending: Emergency Medicine | Admitting: Emergency Medicine

## 2024-02-22 ENCOUNTER — Encounter (HOSPITAL_COMMUNITY): Payer: Self-pay | Admitting: Emergency Medicine

## 2024-02-22 ENCOUNTER — Other Ambulatory Visit: Payer: Self-pay

## 2024-02-22 ENCOUNTER — Emergency Department (HOSPITAL_COMMUNITY)

## 2024-02-22 DIAGNOSIS — Z79899 Other long term (current) drug therapy: Secondary | ICD-10-CM | POA: Diagnosis not present

## 2024-02-22 DIAGNOSIS — Z7984 Long term (current) use of oral hypoglycemic drugs: Secondary | ICD-10-CM | POA: Insufficient documentation

## 2024-02-22 DIAGNOSIS — E119 Type 2 diabetes mellitus without complications: Secondary | ICD-10-CM | POA: Diagnosis not present

## 2024-02-22 DIAGNOSIS — I509 Heart failure, unspecified: Secondary | ICD-10-CM | POA: Diagnosis not present

## 2024-02-22 DIAGNOSIS — R0602 Shortness of breath: Secondary | ICD-10-CM | POA: Diagnosis present

## 2024-02-22 DIAGNOSIS — I110 Hypertensive heart disease with heart failure: Secondary | ICD-10-CM | POA: Diagnosis not present

## 2024-02-22 LAB — CBC WITH DIFFERENTIAL/PLATELET
Abs Immature Granulocytes: 0.04 10*3/uL (ref 0.00–0.07)
Basophils Absolute: 0 10*3/uL (ref 0.0–0.1)
Basophils Relative: 1 %
Eosinophils Absolute: 0.2 10*3/uL (ref 0.0–0.5)
Eosinophils Relative: 3 %
HCT: 34.9 % — ABNORMAL LOW (ref 36.0–46.0)
Hemoglobin: 10.8 g/dL — ABNORMAL LOW (ref 12.0–15.0)
Immature Granulocytes: 1 %
Lymphocytes Relative: 33 %
Lymphs Abs: 2.8 10*3/uL (ref 0.7–4.0)
MCH: 29.5 pg (ref 26.0–34.0)
MCHC: 30.9 g/dL (ref 30.0–36.0)
MCV: 95.4 fL (ref 80.0–100.0)
Monocytes Absolute: 0.5 10*3/uL (ref 0.1–1.0)
Monocytes Relative: 6 %
Neutro Abs: 4.9 10*3/uL (ref 1.7–7.7)
Neutrophils Relative %: 56 %
Platelets: 305 10*3/uL (ref 150–400)
RBC: 3.66 MIL/uL — ABNORMAL LOW (ref 3.87–5.11)
RDW: 14.3 % (ref 11.5–15.5)
WBC: 8.4 10*3/uL (ref 4.0–10.5)
nRBC: 0 % (ref 0.0–0.2)

## 2024-02-22 LAB — BASIC METABOLIC PANEL
Anion gap: 10 (ref 5–15)
BUN: 29 mg/dL — ABNORMAL HIGH (ref 6–20)
CO2: 28 mmol/L (ref 22–32)
Calcium: 8.8 mg/dL — ABNORMAL LOW (ref 8.9–10.3)
Chloride: 100 mmol/L (ref 98–111)
Creatinine, Ser: 1.74 mg/dL — ABNORMAL HIGH (ref 0.44–1.00)
GFR, Estimated: 34 mL/min — ABNORMAL LOW (ref >60)
Glucose, Bld: 118 mg/dL — ABNORMAL HIGH (ref 70–99)
Potassium: 3.4 mmol/L — ABNORMAL LOW (ref 3.5–5.1)
Sodium: 138 mmol/L (ref 135–145)

## 2024-02-22 LAB — RESP PANEL BY RT-PCR (RSV, FLU A&B, COVID)  RVPGX2
Influenza A by PCR: NEGATIVE
Influenza B by PCR: NEGATIVE
Resp Syncytial Virus by PCR: NEGATIVE
SARS Coronavirus 2 by RT PCR: NEGATIVE

## 2024-02-22 LAB — BRAIN NATRIURETIC PEPTIDE: B Natriuretic Peptide: 17.6 pg/mL (ref 0.0–100.0)

## 2024-02-22 MED ORDER — TORSEMIDE 40 MG PO TABS: 40.0000 mg | ORAL_TABLET | Freq: Every morning | ORAL | 0 refills | Status: DC

## 2024-02-22 NOTE — Discharge Instructions (Signed)
 Your blood test and chest x-ray are reassuring today.  You told me felt the Lasix was not working well for you at home.  You said the torsemide it works better.  Therefore I recommend you stop taking the Lasix 80 mg, and instead start taking torsemide 40 mg in the morning.  It is important that you stay on these types of medicines to help you urinate extra fluid out of your lungs.  If you have difficulty affording torsemide, or your insurance does not cover this, you will need to continue with the Lasix medicine (80 mg) until you can see your primary care doctor or cardiologist.

## 2024-02-22 NOTE — ED Provider Notes (Signed)
 [place] EMERGENCY DEPARTMENT AT [facility] Provider Note   CSN: [id number] Arrival date & time: 02/22/24  3086     History  Chief Complaint  Patient presents with   Shortness of Breath    Brandi Gomez is a 53 y.o. female with a history of hypertension, obesity, type 2 diabetes, Polar disorder, seizure disorder, heart failure, presented to ED with shortness of breath.  Patient reports that she was feeling short of breath this morning despite wearing her CPAP overnight.  She denies any recent fevers or chills.  She reports a chronic cough.  She was recently hospitalized approximately 2-1/2 weeks ago, discharged on March 2 per my review of external records.  At that time she was admitted for increasing dyspnea on exertion with a BNP of 274, very mild elevated troponin of 43, negative COVID and flu testing.  Chest x-ray was concern of cardiomegaly and pulmonary edema.  Patient had blood cultures returned no growth.   She was noted to have an EF of 30 to 35% with global hypokinesis, history of angioedema on lisinopril, train did with hydralazine Sorbide and spironolactone and Coreg.  She had a blood culture contaminant.  She is prescribed Lasix 80 mg daily by mouth.... however, patient later told me that she had been not taking this medicine because "it does not make me pee and it does not do anything for me."  She says in the past she was on torsemide which seemed to improve her diuresis.   She denies smoking history  HPI     Home Medications Prior to Admission medications   Medication Sig Start Date End Date Taking? Authorizing Provider  Torsemide 40 MG TABS Take 40 mg by mouth every morning. 02/22/24  Yes Rayfield Beem, Kermit Balo, MD  acetaminophen (TYLENOL) 325 MG tablet Take 2 tablets (650 mg total) by mouth every 4 (four) hours as needed for mild pain (or temp > 37.5 C (99.5 F)). 10/19/19   Angiulli, Mcarthur Rossetti, PA-C  ARIPiprazole (ABILIFY) 10 MG tablet Take 0.5 tablets (5 mg  total) by mouth daily. 09/30/23   Plovsky, Earvin Hansen, MD  atorvastatin (LIPITOR) 40 MG tablet TAKE 1 TABLET (40 MG TOTAL) BY MOUTH DAILY AT 6 PM FOR CHOLESTEROL 09/24/21   Sagardia, Eilleen Kempf, MD  carvedilol (COREG) 6.25 MG tablet Take 6.25 mg by mouth 2 (two) times daily. 05/20/21   [provider]  chlorhexidine (PERIDEX) 0.12 % solution Use as directed 15 mLs in the mouth or throat daily.    [provider]  cloBAZam (ONFI) 10 MG tablet Take 10 mg by mouth at bedtime.    [provider]  cyanocobalamin (VITAMIN B12) 1000 MCG tablet Take 1 tablet (1,000 mcg total) by mouth daily. 02/08/24   Tyrone Nine, MD  ferrous sulfate 325 (65 FE) MG tablet Take 325 mg by mouth daily with breakfast. 07/10/22   [provider]  glipiZIDE (GLUCOTROL) 10 MG tablet Take 10 mg by mouth 2 (two) times daily. 04/27/22   [provider]  hydrOXYzine (VISTARIL) 25 MG capsule 1 QHS  1 qday PRN 02/12/23   Plovsky, Earvin Hansen, MD  isosorbide-hydrALAZINE (BIDIL) 20-37.5 MG tablet Take 0.5 tablets by mouth 3 (three) times daily. 02/08/24   Tyrone Nine, MD  lamoTRIgine (LAMICTAL) 100 MG tablet TAKE 3 TABLETS (300 MG TOTAL) BY MOUTH 2 (TWO) TIMES DAILY. Patient taking differently: Take 200 mg by mouth 2 (two) times daily. 12/09/23   Windell Norfolk, MD  losartan (COZAAR)  25 MG tablet Take 1 tablet (25 mg total) by mouth daily. 02/16/24 05/16/24  Andrey Farmer, PA-C  sitaGLIPtin (JANUVIA) 100 MG tablet Take 100 mg by mouth daily.    [provider]  spironolactone (ALDACTONE) 25 MG tablet Take 0.5 tablets (12.5 mg total) by mouth daily. 02/09/24   Tyrone Nine, MD  Vitamin D, Ergocalciferol, (DRISDOL) 1.25 MG (50000 UNIT) CAPS capsule Take 1 capsule (50,000 Units total) by mouth every 7 (seven) days. 07/03/23   Glean Salvo, NP  methylphenidate (RITALIN) 5 MG tablet Take 1 tablet (5 mg total) by mouth 2 (two) times daily. Patient not taking: Reported on 12/29/2019 10/29/19 01/01/20   Horton Chin, MD      Allergies    Amoxicillin, Lisinopril, Hydrocodone, and Tegretol [carbamazepine]    Review of Systems   Review of Systems  Physical Exam Updated Vital Signs BP (!) 112/54   Pulse 72   Temp 98.3 F (36.8 C) (Oral)   Resp 13   LMP  (LMP Unknown)   SpO2 98%  Physical Exam Constitutional:      General: She is not in acute distress.    Appearance: She is obese.  HENT:     Head: Normocephalic and atraumatic.  Eyes:     Conjunctiva/sclera: Conjunctivae normal.     Pupils: Pupils are equal, round, and reactive to light.  Cardiovascular:     Rate and Rhythm: Normal rate and regular rhythm.  Pulmonary:     Effort: Pulmonary effort is normal. No respiratory distress.  Abdominal:     General: There is no distension.     Tenderness: There is no abdominal tenderness.  Skin:    General: Skin is warm and dry.  Neurological:     General: No focal deficit present.     Mental Status: She is alert. Mental status is at baseline.  Psychiatric:        Mood and Affect: Mood normal.        Behavior: Behavior normal.     ED Results / Procedures / Treatments   Labs (all labs ordered are listed, but only abnormal results are displayed) Labs Reviewed  BASIC METABOLIC PANEL - Abnormal; Notable for the following components:      Result Value   Potassium 3.4 (*)    Glucose, Bld 118 (*)    BUN 29 (*)    Creatinine, Ser 1.74 (*)    Calcium 8.8 (*)    GFR, Estimated 34 (*)    All other components within normal limits  CBC WITH DIFFERENTIAL/PLATELET - Abnormal; Notable for the following components:   RBC 3.66 (*)    Hemoglobin 10.8 (*)    HCT 34.9 (*)    All other components within normal limits  RESP PANEL BY RT-PCR (RSV, FLU A&B, COVID)  RVPGX2  BRAIN NATRIURETIC PEPTIDE    EKG EKG Interpretation Date/Time:  Sunday February 22 2024 07:51:23 EDT Ventricular Rate:  70 PR Interval:  184 QRS Duration:  86 QT Interval:  378 QTC Calculation: 408 R  Axis:   49  Text Interpretation: Normal sinus rhythm When compared with ECG of 16-Feb-2024 15:13, PREVIOUS ECG IS PRESENT Confirmed by Alvester Chou [phone]) on 02/22/2024 7:54:30 AM  Radiology DG Chest 2 View Result Date: 02/22/2024 CLINICAL DATA:  Shortness of breath. EXAM: CHEST - 2 VIEW COMPARISON:  02/02/2024 FINDINGS: Cardiac enlargement, stable. No pleural fluid, interstitial edema, or airspace consolidation. Visualized osseous structures are unremarkable. IMPRESSION: 1. No acute findings. 2.  Stable cardiac enlargement. Electronically Signed   By: Signa Kell M.D.   On: 02/22/2024 08:15    Procedures Procedures    Medications Ordered in ED Medications - No data to display  ED Course/ Medical Decision Making/ A&P Clinical Course as of 02/22/24 1111  Sun Feb 22, 2024  0751 Patient arrives in no respiratory distress, no labored breathing, no hypoxia [MT]    Clinical Course User Index [MT] Marsalis Beaulieu, Kermit Balo, MD                                 Medical Decision Making Amount and/or Complexity of Data Reviewed Labs: ordered. Radiology: ordered. ECG/medicine tests: ordered.  Risk Prescription drug management.   This patient presents to the ED with concern for shortness of breath. This involves an extensive number of treatment options, and is a complaint that carries with it a high risk of complications and morbidity.  The differential diagnosis includes congestive heart failure versus pulmonary edema versus pleural effusion versus viral URI versus anemia versus other  Co-morbidities that complicate the patient evaluation: History of congestive heart failure at risk of exacerbation  Additional history obtained from EMS  External records from outside source obtained and reviewed including recent hospitalization records as noted in history above  I ordered and personally interpreted labs.  The pertinent results include: COVID flu and RSV are negative.  Hemoglobin near  baseline anemia levels at 10.8.  White blood cell count normal.  BMP also near baseline levels. BNP wnl.  I ordered imaging studies including x-ray of the chest I independently visualized and interpreted imaging which showed stable cardiomegaly, no overt pulmonary edema or infiltrate I agree with the radiologist interpretation  The patient was maintained on a cardiac monitor.  I personally viewed and interpreted the cardiac monitored which showed an underlying rhythm of: regular heart rate  Per my interpretation the patient's ECG shows sinus rhythm no acute ischemic findings  I have reviewed the patients home medicines and have made adjustments as needed  Test Considered: Low suspicion for acute PE.  The patient is not tachycardic or hypoxic.  After the interventions noted above, I reevaluated the patient and found that they have: stayed the same -patient is stable on room air  I suspect the patient may be experiencing some mild worsening pulmonary edema that is not evident from chest x-ray, and thankfully not causing hypoxia, but was likely triggered by the fact that she is not taking any diuretics at home.  She discontinued the Lasix after 1 day at home because she felt like it was not producing diuresis in her house.  She is supposed to be taking Lasix 80 mg in the morning.  She prefers torsemide and feels it is more effective, so I am fine with represcribing her and restarting her on 40 mg of torsemide daily.  There is some concern she may not be able to afford the co-pay or, her insurance may not cover this, in which case she will need to get back on the Lasix until she sees her cardiologist.  She verbalizes understanding and will be calling her family to pick her up.   Dispostion:   After consideration of the diagnostic results and the patients response to treatment, I feel that the patent would benefit from outpatient follow-up.         Final Clinical Impression(s) / ED  Diagnoses Final diagnoses:  Shortness of breath  Rx / DC Orders ED Discharge Orders          Ordered    Torsemide 40 MG TABS  Every morning        02/22/24 1108              Terald Sleeper, MD 02/22/24 [phone]

## 2024-02-22 NOTE — ED Triage Notes (Signed)
 Pt BIB [facility] from home with reports of [facility] that started this morning. Per Ems pt 98% on RA. Pt was able to walk down stairs and to the ambulance with no exertional SHOB.

## 2024-02-27 ENCOUNTER — Other Ambulatory Visit (HOSPITAL_COMMUNITY): Payer: Self-pay | Admitting: Psychiatry

## 2024-03-01 ENCOUNTER — Ambulatory Visit (HOSPITAL_BASED_OUTPATIENT_CLINIC_OR_DEPARTMENT_OTHER): Admitting: Family

## 2024-03-01 VITALS — BP 108/70 | HR 83 | Ht 63.0 in | Wt 297.7 lb

## 2024-03-01 DIAGNOSIS — I5022 Chronic systolic (congestive) heart failure: Secondary | ICD-10-CM

## 2024-03-01 DIAGNOSIS — N1831 Chronic kidney disease, stage 3a: Secondary | ICD-10-CM | POA: Diagnosis not present

## 2024-03-01 DIAGNOSIS — E1165 Type 2 diabetes mellitus with hyperglycemia: Secondary | ICD-10-CM

## 2024-03-01 DIAGNOSIS — G4733 Obstructive sleep apnea (adult) (pediatric): Secondary | ICD-10-CM | POA: Diagnosis not present

## 2024-03-01 NOTE — Patient Instructions (Addendum)
 Medication Instructions:  Your physician recommends that you continue on your current medications as directed. Please refer to the Current Medication list given to you today.  *If you need a refill on your cardiac medications before your next appointment, please call your pharmacy*   Lab Work: Your physician recommends that you return for lab work today- BMP & CBC   Testing/Procedures: You are scheduled for a Cardiac Catheterization on Monday, March 31 with Dr.Stoner.   1. Please arrive at the [facility] (Main Entrance A) at [facility]: [address] [place], [place] [address] at 7:00 AM (This time is 2 hour(s) before your procedure to ensure your preparation).    Free valet parking service is available. You will check in at ADMITTING. The support person will be asked to wait in the waiting room.  It is OK to have someone drop you off and come back when you are ready to be discharged.     Special note: Every effort is made to have your procedure done on time. Please understand that emergencies sometimes delay scheduled procedures.   2. Diet: Do not eat solid foods after midnight.  The patient may have clear liquids until 5am upon the day of the procedure.   3. Labs: Done at OV on 3/24   4. Medication instructions in preparation for your procedure:   Hold following medications morning of procedure:   Glipizide Januvia Spironolactone Lasix   Follow-Up: At [facility], you and your health needs are our priority.  As part of our continuing mission to provide you with exceptional heart care, we have created designated Provider Care Teams.  These Care Teams include your primary Cardiologist (physician) and Advanced Practice Providers (APPs -  Physician Assistants and Nurse Practitioners) who all work together to provide you with the care you need, when you need it.  We recommend signing up for the patient portal called "MyChart".  Sign up information is provided on  this After Visit Summary.  MyChart is used to connect with patients for Virtual Visits (Telemedicine).  Patients are able to view lab/test results, encounter notes, upcoming appointments, etc.  Non-urgent messages can be sent to your provider as well.   To learn more about what you can do with MyChart, go to [email or web address].    Your next appointment:   Follow up as scheduled with Dr. Elwyn Lade   &   April 25th at 1:55pm with Gillian Shields, NP   Other Instructions We will call tomorrow with instruction on fluid pill

## 2024-03-01 NOTE — Progress Notes (Signed)
 Cardiology Office Note:  .   Date:  03/07/2024  ID:  GENELLA BAS, DOB 11-May-1971, MRN [id number] PCP: Loura Back, NP  [facility] Providers Cardiologist:  Chilton Si, MD Electrophysiologist:  Will Jorja Loa, MD    History of Present Illness: Marland Kitchen   LILAH MIJANGOS is a 53 y.o. female with a history of CKD 3, HFrEF, morbid obesity, OSA on CPAP, CVA (felt to be cardioembolic), epilepsy.  Prior intolerance to ACE due to angioedema.  Heart failure dates back to 2018.  Additionally follows with Dr. Elwyn Lade in the advanced heart failure clinic.  Presents today for follow-up with family member.  She reports exertional dyspnea while walking through the mall recently.  No orthopnea, PND.  Wearing her CPAP regularly.  She notes her usual home weight is 296 pounds but weight up today compared to her baseline.  She takes her medications routinely at 10 AM, 2 PM, 10 PM.  Significant portion of visit time spent reviewing low-sodium diet, heart failure fluid restrictions and eating plan  ROS: Please see the history of present illness.    All other systems reviewed and are negative.   Studies Reviewed: .        Cardiac Studies & Procedures   ______________________________________________________________________________________________     ECHOCARDIOGRAM  ECHOCARDIOGRAM COMPLETE 02/03/2024  Narrative ECHOCARDIOGRAM REPORT    Patient Name:   BELLA BRUMMET Date of Exam: 02/03/2024 Medical Rec #:  [id number]          Height:       63.0 in Accession #:    [id number]         Weight:       297.6 lb Date of Birth:  02-26-1971          BSA:          2.289 m Patient Age:    52 years           BP:           127/81 mmHg Patient Gender: F                  HR:           73 bpm. Exam Location:  Inpatient  Procedure: 2D Echo (Both Spectral and Color Flow Doppler were utilized during procedure).  Indications:    CHF  History:        Patient has prior history of Echocardiogram  examinations, most recent 10/12/2019. Cardiomegaly, HFrEF and CHF, CKD3a, repiratory failure; seizure disorder; Risk Factors:Non-Smoker, Diabetes, Hypertension, Dyslipidemia and Sleep Apnea.  Sonographer:    Dondra Prader RVT RCS Referring Phys: [id number] TIMOTHY S OPYD   Sonographer Comments: Technically challenging study due to limited acoustic windows, Technically difficult study due to poor echo windows, suboptimal parasternal window and patient is obese. Image acquisition challenging due to patient body habitus and Coughing throughout exam. Definity not used due to foreshortened images which were difficult to obtain. IMPRESSIONS   1. Left ventricular ejection fraction, by estimation, is 30 to 35%. The left ventricle has moderately decreased function. The left ventricle demonstrates global hypokinesis. The left ventricular internal cavity size was mildly dilated. Left ventricular diastolic parameters are consistent with Grade II diastolic dysfunction (pseudonormalization). 2. Right ventricular systolic function is normal. The right ventricular size is normal. Tricuspid regurgitation signal is inadequate for assessing PA pressure. 3. Left atrial size was moderately dilated. 4. The mitral valve is normal in structure. Trivial mitral valve regurgitation. No evidence of mitral stenosis.  5. The aortic valve is tricuspid. Aortic valve regurgitation is not visualized. No aortic stenosis is present. 6. Aortic dilatation noted. There is borderline dilatation of the ascending aorta, measuring 37 mm. 7. The inferior vena cava is normal in size with <50% respiratory variability, suggesting right atrial pressure of 8 mmHg.  FINDINGS Left Ventricle: Left ventricular ejection fraction, by estimation, is 30 to 35%. The left ventricle has moderately decreased function. The left ventricle demonstrates global hypokinesis. Strain imaging was not performed. The left ventricular internal cavity size was mildly  dilated. There is no left ventricular hypertrophy. Left ventricular diastolic parameters are consistent with Grade II diastolic dysfunction (pseudonormalization).  Right Ventricle: The right ventricular size is normal. No increase in right ventricular wall thickness. Right ventricular systolic function is normal. Tricuspid regurgitation signal is inadequate for assessing PA pressure.  Left Atrium: Left atrial size was moderately dilated.  Right Atrium: Right atrial size was normal in size.  Pericardium: Trivial pericardial effusion is present.  Mitral Valve: The mitral valve is normal in structure. Trivial mitral valve regurgitation. No evidence of mitral valve stenosis.  Tricuspid Valve: The tricuspid valve is normal in structure. Tricuspid valve regurgitation is not demonstrated.  Aortic Valve: The aortic valve is tricuspid. Aortic valve regurgitation is not visualized. No aortic stenosis is present. Aortic valve mean gradient measures 5.0 mmHg. Aortic valve peak gradient measures 9.1 mmHg. Aortic valve area, by VTI measures 2.15 cm.  Pulmonic Valve: The pulmonic valve was normal in structure. Pulmonic valve regurgitation is not visualized.  Aorta: Aortic dilatation noted and the aortic root is normal in size and structure. There is borderline dilatation of the ascending aorta, measuring 37 mm.  Venous: The inferior vena cava is normal in size with less than 50% respiratory variability, suggesting right atrial pressure of 8 mmHg.  IAS/Shunts: No atrial level shunt detected by color flow Doppler.  Additional Comments: 3D imaging was not performed.   LEFT VENTRICLE PLAX 2D LVIDd:         6.10 cm   Diastology LVIDs:         4.70 cm   LV e' medial:    4.87 cm/s LV PW:         1.60 cm   LV E/e' medial:  17.2 LV IVS:        1.00 cm   LV e' lateral:   5.19 cm/s LVOT diam:     2.20 cm   LV E/e' lateral: 16.2 LV SV:         64 LV SV Index:   28 LVOT Area:     3.80 cm   RIGHT  VENTRICLE             IVC RV S prime:     16.10 cm/s  IVC diam: 1.90 cm TAPSE (M-mode): 2.9 cm  LEFT ATRIUM              Index        RIGHT ATRIUM           Index LA diam:        5.00 cm  2.18 cm/m   RA Area:     14.20 cm LA Vol (A2C):   62.4 ml  27.26 ml/m  RA Volume:   35.40 ml  15.46 ml/m LA Vol (A4C):   106.2 ml 46.41 ml/m LA Biplane Vol: 76.4 ml  33.37 ml/m AORTIC VALVE  PULMONIC VALVE AV Area (Vmax):    1.98 cm      PV Vmax:       0.71 m/s AV Area (Vmean):   1.81 cm      PV Peak grad:  2.0 mmHg AV Area (VTI):     2.15 cm AV Vmax:           151.00 cm/s AV Vmean:          105.000 cm/s AV VTI:            0.297 m AV Peak Grad:      9.1 mmHg AV Mean Grad:      5.0 mmHg LVOT Vmax:         78.80 cm/s LVOT Vmean:        50.100 cm/s LVOT VTI:          0.168 m LVOT/AV VTI ratio: 0.57  AORTA Ao Root diam: 3.00 cm Ao Asc diam:  3.70 cm  MITRAL VALVE MV Area (PHT): 3.72 cm    SHUNTS MV Decel Time: 204 msec    Systemic VTI:  0.17 m MV E velocity: 83.90 cm/s  Systemic Diam: 2.20 cm MV A velocity: 59.20 cm/s MV E/A ratio:  1.42  Dalton McleanMD Electronically signed by Wilfred Lacy Signature Date/Time: 02/03/2024/1:32:50 PM    Final   TEE  ECHO TEE 11/15/2019  Narrative TRANSESOPHOGEAL ECHO REPORT    Patient Name:   ASHAYA RAFTERY Date of Exam: 11/15/2019 Medical Rec #:  [id number]          Height:       61.0 in Accession #:    [id number]         Weight:       281.0 lb Date of Birth:  January 01, 1971          BSA:          2.18 m Patient Age:    48 years           BP:           134/93 mmHg Patient Gender: F                  HR:           92 bpm. Exam Location:  Inpatient   Procedure: Transesophageal Echo, Cardiac Doppler, Color Doppler and Saline Contrast Bubble Study  Indications:     Stroke  History:         Patient has prior history of Echocardiogram examinations.  Sonographer:     Sheralyn Boatman RDCS Referring Phys:  [id number] Thomasene Ripple  CLEAVER Diagnosing Phys: Tobias Alexander MD    PROCEDURE: Normal Transesophogeal exam. Consent was requested emergently by emergency room physicain. Patients was monitored while under deep sedation Anesthestetic sedation was provided intravenously by Anesthesiology: 335mg  of Propofol. The transesophogeal probe was passed through the esophogus of the patient. Imaged were obtained with the patient in a left lateral decubitus position. The patient's vital signs; including heart rate, blood pressure, and oxygen saturation; remained stable throughout the procedure. The patient developed no complications during the procedure.  IMPRESSIONS   1. Left ventricular ejection fraction, by visual estimation, is 35 to 40%. The left ventricle has moderately decreased function. Mildly increased left ventricular size. There is no left ventricular hypertrophy. 2. Global right ventricle has normal systolic function.The right ventricular size is normal. No increase in right ventricular wall thickness. 3. Left atrial size was normal. 4. There is no thrombus in the  left atrium and left atrial appendage. Normal filling and emptying velocities. 5. Right atrial size was normal. 6. The mitral valve is normal in structure. Mild mitral valve regurgitation. No evidence of mitral stenosis. 7. The tricuspid valve is normal in structure. Tricuspid valve regurgitation is mild. 8. The aortic valve is normal in structure. Aortic valve regurgitation is not visualized. No evidence of aortic valve sclerosis or stenosis. 9. The pulmonic valve was normal in structure. Pulmonic valve regurgitation is not visualized. 10. The inferior vena cava is normal in size with greater than 50% respiratory variability, suggesting right atrial pressure of 3 mmHg. 11. No PFO or ASD. Negative bubble study. 12. No source of intracardiac thrombus.  FINDINGS Left Ventricle: Left ventricular ejection fraction, by visual estimation, is 35 to 40%. The  left ventricle has moderately decreased function. There is no left ventricular hypertrophy. Mildly increased left ventricular size. Normal left atrial pressure.  Right Ventricle: The right ventricular size is normal. No increase in right ventricular wall thickness. Global RV systolic function is has normal systolic function.  Left Atrium: Left atrial size was normal in size. There is no thrombus in the left atrium and left atrial appendage. Normal filling and emptying velocities.  Right Atrium: Right atrial size was normal in size  Pericardium: There is no evidence of pericardial effusion.  Mitral Valve: The mitral valve is normal in structure. No evidence of mitral valve stenosis by observation. Mild mitral valve regurgitation.  Tricuspid Valve: The tricuspid valve is normal in structure. Tricuspid valve regurgitation is mild.  Aortic Valve: The aortic valve is normal in structure. Aortic valve regurgitation is not visualized. The aortic valve is structurally normal, with no evidence of sclerosis or stenosis.  Pulmonic Valve: The pulmonic valve was normal in structure. Pulmonic valve regurgitation is not visualized.  Aorta: The aortic root, ascending aorta and aortic arch are all structurally normal, with no evidence of dilitation or obstruction.  Venous: The inferior vena cava is normal in size with greater than 50% respiratory variability, suggesting right atrial pressure of 3 mmHg.  Shunts: Agitated saline contrast was given intravenously to evaluate for intracardiac shunting. Saline contrast bubble study was negative, with no evidence of any interatrial shunt. There is no evidence of a patent foramen ovale. No ventricular septal defect is seen or detected. There is no evidence of an atrial septal defect. No atrial level shunt detected by color flow Doppler.   Tobias Alexander MD Electronically signed by Tobias Alexander MD Signature Date/Time: 11/15/2019/2:23:39 PM    Final         ______________________________________________________________________________________________      Risk Assessment/Calculations:            Physical Exam:   VS:  BP 108/70   Pulse 83   Ht 5\' 3"  (1.6 m)   Wt 297 lb 11.2 oz (135 kg)   LMP  (LMP Unknown)   SpO2 94%   BMI 52.74 kg/m    Wt Readings from Last 3 Encounters:  03/01/24 297 lb 11.2 oz (135 kg)  02/16/24 288 lb 9.6 oz (130.9 kg)  02/08/24 282 lb (127.9 kg)    GEN: Well nourished, overweight, well developed in no acute distress NECK: No JVD; No carotid bruits CARDIAC: RRR, no murmurs, rubs, gallops RESPIRATORY:  Clear to auscultation without rales, wheezing or rhonchi  ABDOMEN: Soft, non-tender, non-distended EXTREMITIES:  No edema; No deformity   ASSESSMENT AND PLAN: .    HFrEF- Onset 2018.  Most recent echo 01/2024 LVEF 30-35%,  grade 2 diastolic dysfunction.  R/LHC upcoming next week with Dr. Elwyn Lade to assess ideology and volume status. Continue GDMT carvedilol 6.25 mg twice daily, BiDil half tablet 3 times daily, spironolactone 12.5 mg daily, losartan 25 mg daily. Presently on torsemide 40 mg daily.  Continue same.  Encouraged adherence to low-sodium diet, fluid restriction as weight trending up.  Educated to contact our office or heart failure team if weight increases by 2 pounds overnight or 5 pounds in 1 week. Per heart failure team notes no SGLT2i for now with frequent UTI but consider in the future.  CKD 3A- Careful titration of diuretic and antihypertensive.  BMET/CBC today for monitoring prior to cardiac catheterization next week  DM2- Continue to follow with PCP.  Consider GLP-1 post LHC.   Morbid obesity- Weight loss via diet and exercise encouraged. Discussed the impact being overweight would have on cardiovascular risk.  Consider GLP-1 post LHC.   OSA- CPAP compliance encouraged.  Endorses using regularly  History of CVA-Loop recorder placed in 2018 with no finding of atrial  fibrillation.   Cardiac Rehabilitation Eligibility Assessment  The patient is ready to start cardiac rehabilitation from a cardiac standpoint.       Dispo: follow up in 1 month to discuss GLP1  Signed, Alver Sorrow, NP

## 2024-03-02 ENCOUNTER — Telehealth (HOSPITAL_BASED_OUTPATIENT_CLINIC_OR_DEPARTMENT_OTHER): Payer: Self-pay

## 2024-03-02 LAB — CBC
Hematocrit: 33.1 % — ABNORMAL LOW (ref 34.0–46.6)
Hemoglobin: 10.6 g/dL — ABNORMAL LOW (ref 11.1–15.9)
MCH: 29.1 pg (ref 26.6–33.0)
MCHC: 32 g/dL (ref 31.5–35.7)
MCV: 91 fL (ref 79–97)
Platelets: 315 10*3/uL (ref 150–450)
RBC: 3.64 x10E6/uL — ABNORMAL LOW (ref 3.77–5.28)
RDW: 13.5 % (ref 11.7–15.4)
WBC: 9.6 10*3/uL (ref 3.4–10.8)

## 2024-03-02 LAB — BASIC METABOLIC PANEL
BUN/Creatinine Ratio: 12 (ref 9–23)
BUN: 17 mg/dL (ref 6–24)
CO2: 23 mmol/L (ref 20–29)
Calcium: 9 mg/dL (ref 8.7–10.2)
Chloride: 105 mmol/L (ref 96–106)
Creatinine, Ser: 1.43 mg/dL — ABNORMAL HIGH (ref 0.57–1.00)
Glucose: 71 mg/dL (ref 70–99)
Potassium: 4.2 mmol/L (ref 3.5–5.2)
Sodium: 143 mmol/L (ref 134–144)
eGFR: 43 mL/min/{1.73_m2} — ABNORMAL LOW (ref >59)

## 2024-03-02 NOTE — Telephone Encounter (Addendum)
 Results called to patient who verbalizes understanding!     ----- Message from Alver Sorrow sent at 03/02/2024 12:30 PM EDT ----- Stable mild anemia.  Kidney function improved from previous.  Normal electrolytes. Proceed with LHC as scheduled.   Dr. Elwyn Lade - just FYI for upcoming [facility] 03/08/24

## 2024-03-04 ENCOUNTER — Other Ambulatory Visit: Payer: Self-pay | Admitting: Nephrology

## 2024-03-04 DIAGNOSIS — N1832 Chronic kidney disease, stage 3b: Secondary | ICD-10-CM

## 2024-03-05 ENCOUNTER — Ambulatory Visit
Admission: RE | Admit: 2024-03-05 | Discharge: 2024-03-05 | Disposition: A | Discharge status: Home / Self Care | Source: Ambulatory Visit | Attending: Nephrology | Admitting: Nephrology

## 2024-03-05 DIAGNOSIS — N1832 Chronic kidney disease, stage 3b: Secondary | ICD-10-CM

## 2024-03-07 ENCOUNTER — Encounter (HOSPITAL_BASED_OUTPATIENT_CLINIC_OR_DEPARTMENT_OTHER): Payer: Self-pay | Admitting: Family

## 2024-03-08 ENCOUNTER — Ambulatory Visit (HOSPITAL_COMMUNITY)
Admission: RE | Admit: 2024-03-08 | Discharge: 2024-03-08 | Disposition: A | Discharge status: Home / Self Care | Attending: Cardiology | Admitting: Cardiology

## 2024-03-08 ENCOUNTER — Other Ambulatory Visit: Payer: Self-pay

## 2024-03-08 ENCOUNTER — Other Ambulatory Visit: Payer: Self-pay | Admitting: Neurology

## 2024-03-08 ENCOUNTER — Encounter (HOSPITAL_COMMUNITY)
Admission: RE | Disposition: A | Discharge status: Home / Self Care | Payer: Self-pay | Source: Home / Self Care | Attending: Cardiology

## 2024-03-08 DIAGNOSIS — Z8673 Personal history of transient ischemic attack (TIA), and cerebral infarction without residual deficits: Secondary | ICD-10-CM | POA: Insufficient documentation

## 2024-03-08 DIAGNOSIS — Z7984 Long term (current) use of oral hypoglycemic drugs: Secondary | ICD-10-CM | POA: Diagnosis not present

## 2024-03-08 DIAGNOSIS — Z95818 Presence of other cardiac implants and grafts: Secondary | ICD-10-CM | POA: Diagnosis not present

## 2024-03-08 DIAGNOSIS — Z79899 Other long term (current) drug therapy: Secondary | ICD-10-CM | POA: Diagnosis not present

## 2024-03-08 DIAGNOSIS — I130 Hypertensive heart and chronic kidney disease with heart failure and stage 1 through stage 4 chronic kidney disease, or unspecified chronic kidney disease: Secondary | ICD-10-CM | POA: Insufficient documentation

## 2024-03-08 DIAGNOSIS — G4733 Obstructive sleep apnea (adult) (pediatric): Secondary | ICD-10-CM | POA: Insufficient documentation

## 2024-03-08 DIAGNOSIS — N1831 Chronic kidney disease, stage 3a: Secondary | ICD-10-CM | POA: Insufficient documentation

## 2024-03-08 DIAGNOSIS — Z556 Problems related to health literacy: Secondary | ICD-10-CM | POA: Insufficient documentation

## 2024-03-08 DIAGNOSIS — I2722 Pulmonary hypertension due to left heart disease: Secondary | ICD-10-CM | POA: Insufficient documentation

## 2024-03-08 DIAGNOSIS — F319 Bipolar disorder, unspecified: Secondary | ICD-10-CM | POA: Diagnosis not present

## 2024-03-08 DIAGNOSIS — I5022 Chronic systolic (congestive) heart failure: Secondary | ICD-10-CM | POA: Diagnosis not present

## 2024-03-08 DIAGNOSIS — E1122 Type 2 diabetes mellitus with diabetic chronic kidney disease: Secondary | ICD-10-CM | POA: Insufficient documentation

## 2024-03-08 DIAGNOSIS — I509 Heart failure, unspecified: Secondary | ICD-10-CM

## 2024-03-08 DIAGNOSIS — Z6841 Body Mass Index (BMI) 40.0 and over, adult: Secondary | ICD-10-CM | POA: Insufficient documentation

## 2024-03-08 DIAGNOSIS — I5020 Unspecified systolic (congestive) heart failure: Secondary | ICD-10-CM

## 2024-03-08 DIAGNOSIS — G40909 Epilepsy, unspecified, not intractable, without status epilepticus: Secondary | ICD-10-CM | POA: Insufficient documentation

## 2024-03-08 DIAGNOSIS — I5042 Chronic combined systolic (congestive) and diastolic (congestive) heart failure: Secondary | ICD-10-CM

## 2024-03-08 HISTORY — PX: RIGHT HEART CATH AND CORONARY ANGIOGRAPHY: CATH118264

## 2024-03-08 LAB — POCT I-STAT 7, (LYTES, BLD GAS, ICA,H+H)
Acid-Base Excess: 4 mmol/L — ABNORMAL HIGH (ref 0.0–2.0)
Bicarbonate: 29 mmol/L — ABNORMAL HIGH (ref 20.0–28.0)
Calcium, Ion: 1.06 mmol/L — ABNORMAL LOW (ref 1.15–1.40)
HCT: 29 % — ABNORMAL LOW (ref 36.0–46.0)
Hemoglobin: 9.9 g/dL — ABNORMAL LOW (ref 12.0–15.0)
O2 Saturation: 87 %
Potassium: 3 mmol/L — ABNORMAL LOW (ref 3.5–5.1)
Sodium: 141 mmol/L (ref 135–145)
TCO2: 30 mmol/L (ref 22–32)
pCO2 arterial: 45.9 mmHg (ref 32–48)
pH, Arterial: 7.409 (ref 7.35–7.45)
pO2, Arterial: 53 mmHg — ABNORMAL LOW (ref 83–108)

## 2024-03-08 LAB — POCT I-STAT EG7
Acid-Base Excess: 5 mmol/L — ABNORMAL HIGH (ref 0.0–2.0)
Acid-Base Excess: 5 mmol/L — ABNORMAL HIGH (ref 0.0–2.0)
Bicarbonate: 30.8 mmol/L — ABNORMAL HIGH (ref 20.0–28.0)
Bicarbonate: 31 mmol/L — ABNORMAL HIGH (ref 20.0–28.0)
Calcium, Ion: 1.13 mmol/L — ABNORMAL LOW (ref 1.15–1.40)
Calcium, Ion: 1.14 mmol/L — ABNORMAL LOW (ref 1.15–1.40)
HCT: 31 % — ABNORMAL LOW (ref 36.0–46.0)
HCT: 31 % — ABNORMAL LOW (ref 36.0–46.0)
Hemoglobin: 10.5 g/dL — ABNORMAL LOW (ref 12.0–15.0)
Hemoglobin: 10.5 g/dL — ABNORMAL LOW (ref 12.0–15.0)
O2 Saturation: 58 %
O2 Saturation: 60 %
Potassium: 3.1 mmol/L — ABNORMAL LOW (ref 3.5–5.1)
Potassium: 3.1 mmol/L — ABNORMAL LOW (ref 3.5–5.1)
Sodium: 140 mmol/L (ref 135–145)
Sodium: 140 mmol/L (ref 135–145)
TCO2: 32 mmol/L (ref 22–32)
TCO2: 33 mmol/L — ABNORMAL HIGH (ref 22–32)
pCO2, Ven: 51.4 mmHg (ref 44–60)
pCO2, Ven: 51.5 mmHg (ref 44–60)
pH, Ven: 7.385 (ref 7.25–7.43)
pH, Ven: 7.389 (ref 7.25–7.43)
pO2, Ven: 31 mmHg — CL (ref 32–45)
pO2, Ven: 32 mmHg (ref 32–45)

## 2024-03-08 LAB — GLUCOSE, CAPILLARY: Glucose-Capillary: 96 mg/dL (ref 70–99)

## 2024-03-08 SURGERY — RIGHT HEART CATH AND CORONARY ANGIOGRAPHY
Anesthesia: LOCAL

## 2024-03-08 MED ORDER — HEPARIN (PORCINE) IN NACL 1000-0.9 UT/500ML-% IV SOLN
INTRAVENOUS | Status: DC | PRN
  Administered 2024-03-08 (×2): 500 mL

## 2024-03-08 MED ORDER — MIDAZOLAM HCL 2 MG/2ML IJ SOLN
INTRAMUSCULAR | Status: AC
  Filled 2024-03-08: qty 2

## 2024-03-08 MED ORDER — FENTANYL CITRATE (PF) 100 MCG/2ML IJ SOLN
INTRAMUSCULAR | Status: DC | PRN
  Administered 2024-03-08: 25 ug via INTRAVENOUS

## 2024-03-08 MED ORDER — IOHEXOL 350 MG/ML SOLN
INTRAVENOUS | Status: DC | PRN
  Administered 2024-03-08: 27 mL via INTRA_ARTERIAL

## 2024-03-08 MED ORDER — HEPARIN SODIUM (PORCINE) 1000 UNIT/ML IJ SOLN
INTRAMUSCULAR | Status: DC | PRN
  Administered 2024-03-08: 5000 [IU] via INTRAVENOUS

## 2024-03-08 MED ORDER — VERAPAMIL HCL 2.5 MG/ML IV SOLN
INTRAVENOUS | Status: AC
  Filled 2024-03-08: qty 2

## 2024-03-08 MED ORDER — LIDOCAINE HCL (PF) 1 % IJ SOLN
INTRAMUSCULAR | Status: DC | PRN
Start: 2024-03-08 — End: 2024-03-08
  Administered 2024-03-08 (×2): 2 mL

## 2024-03-08 MED ORDER — TORSEMIDE 40 MG PO TABS: 40.0000 mg | ORAL_TABLET | Freq: Two times a day (BID) | ORAL | 11 refills | Status: DC

## 2024-03-08 MED ORDER — FENTANYL CITRATE (PF) 100 MCG/2ML IJ SOLN
INTRAMUSCULAR | Status: AC
  Filled 2024-03-08: qty 2

## 2024-03-08 MED ORDER — HEPARIN SODIUM (PORCINE) 1000 UNIT/ML IJ SOLN
INTRAMUSCULAR | Status: AC
  Filled 2024-03-08: qty 10

## 2024-03-08 MED ORDER — ASPIRIN 81 MG PO CHEW
CHEWABLE_TABLET | ORAL | Status: AC
  Filled 2024-03-08: qty 1

## 2024-03-08 MED ORDER — LIDOCAINE HCL (PF) 1 % IJ SOLN
INTRAMUSCULAR | Status: AC
  Filled 2024-03-08: qty 30

## 2024-03-08 MED ORDER — MIDAZOLAM HCL 2 MG/2ML IJ SOLN
INTRAMUSCULAR | Status: DC | PRN
  Administered 2024-03-08: 1 mg via INTRAVENOUS

## 2024-03-08 MED ORDER — SODIUM CHLORIDE 0.9 % IV SOLN: INTRAVENOUS | Status: DC

## 2024-03-08 MED ORDER — LOSARTAN POTASSIUM 50 MG PO TABS: 50.0000 mg | ORAL_TABLET | Freq: Every day | ORAL | 3 refills | Status: DC

## 2024-03-08 MED ORDER — ASPIRIN 81 MG PO CHEW
81.0000 mg | CHEWABLE_TABLET | Freq: Once | ORAL | Status: AC
  Administered 2024-03-08: 81 mg via ORAL

## 2024-03-08 MED ORDER — VERAPAMIL HCL 2.5 MG/ML IV SOLN
INTRAVENOUS | Status: DC | PRN
  Administered 2024-03-08: 10 mL via INTRA_ARTERIAL

## 2024-03-08 SURGICAL SUPPLY — 13 items
CATH 5FR JL3.5 JR4 ANG PIG MP (CATHETERS) IMPLANT
CATH BALLN WEDGE 5F 110CM (CATHETERS) IMPLANT
DEVICE RAD COMP TR BAND LRG (VASCULAR PRODUCTS) IMPLANT
GLIDESHEATH SLEND SS 6F .021 (SHEATH) IMPLANT
GUIDEWIRE INQWIRE 1.5J.035X260 (WIRE) IMPLANT
INQWIRE 1.5J .035X260CM (WIRE) ×1 IMPLANT
KIT SINGLE USE MANIFOLD (KITS) IMPLANT
KIT SYRINGE INJ CVI SPIKEX1 (MISCELLANEOUS) IMPLANT
PACK CARDIAC CATHETERIZATION (CUSTOM PROCEDURE TRAY) ×2 IMPLANT
SET ATX-X65L (MISCELLANEOUS) IMPLANT
SHEATH GLIDE SLENDER 4/5FR (SHEATH) IMPLANT
SHEATH PROBE COVER 6X72 (BAG) IMPLANT
WIRE EMERALD 3MM-J .025X260CM (WIRE) IMPLANT

## 2024-03-08 NOTE — Interval H&P Note (Signed)
 History and Physical Interval Note:  03/08/2024 9:03 AM  Brandi Gomez  has presented today for surgery, with the diagnosis of Heart Faulire.  The various methods of treatment have been discussed with the patient and family. After consideration of risks, benefits and other options for treatment, the patient has consented to  Procedure(s): RIGHT/LEFT HEART CATH AND CORONARY ANGIOGRAPHY (N/A) as a surgical intervention.  The patient's history has been reviewed, patient examined, no change in status, stable for surgery.  I have reviewed the patient's chart and labs.  Questions were answered to the patient's satisfaction.     [name]

## 2024-03-09 ENCOUNTER — Encounter (HOSPITAL_COMMUNITY): Payer: Self-pay | Admitting: Cardiology

## 2024-03-15 ENCOUNTER — Encounter (HOSPITAL_COMMUNITY): Admitting: Cardiology

## 2024-03-17 ENCOUNTER — Other Ambulatory Visit: Payer: Self-pay

## 2024-03-17 ENCOUNTER — Encounter (HOSPITAL_COMMUNITY): Payer: Self-pay | Admitting: Psychiatry

## 2024-03-17 ENCOUNTER — Ambulatory Visit (HOSPITAL_COMMUNITY)

## 2024-03-17 ENCOUNTER — Ambulatory Visit (HOSPITAL_BASED_OUTPATIENT_CLINIC_OR_DEPARTMENT_OTHER): Admitting: Psychiatry

## 2024-03-17 VITALS — BP 137/99 | HR 86 | Ht 62.0 in | Wt 295.0 lb

## 2024-03-17 DIAGNOSIS — F22 Delusional disorders: Secondary | ICD-10-CM

## 2024-03-17 MED ORDER — ARIPIPRAZOLE 10 MG PO TABS: 5.0000 mg | ORAL_TABLET | Freq: Every day | ORAL | 2 refills | Status: DC

## 2024-03-17 NOTE — Progress Notes (Signed)
 Psychiatric Initial Adult Assessment   Patient Identification: Brandi Gomez MRN:  [id number] Date of Evaluation:  03/17/2024 Referral Source: Dr. Hayden Pedro Chief Complaint:  Suspiciousness/paranoia Visit Diagnosis: Mood disorder secondary to CVA     Today the patient is seen in the office with her boyfriend [name].  Since I have seen her she has been hospitalized for a couple days and was told that she had heart failure.  She is on a diuretic at this time.  Her psychotic symptoms seem to be significantly less.  She no longer is worried about the devil.  She is no longer crying.  Her mood seems to be actually pretty good.  [name] is quick to say that her memory is very bad.  The patient is morbidly obese and is not compliant with her diet.  Her medical doctors are beginning her on medicines to try to reduce her appetite.  She shows no signs of mania.  She has no specific anxiety disorders.  She does conclude that since the Abilify has been increased to 10 mg overall she feels better.  She is less distracted.  She is no longer on the Internet.  [name] is very dedicated to her and is trying to get her to eat better.  The patient denies persistent daily depression.  She denies the presence of auditory or visual hallucinations.  (Hypo) Manic Symptoms:   Anxiety Symptoms:   Psychotic Symptoms:   PTSD Symptoms:   Past Psychiatric History: Cymbalta 30 mg Previous Psychotropic Medications: Cymbalta 30 mg  Substance Abuse History in the last 12 months:  No.  Consequences of Substance Abuse:   Past Medical History:  Past Medical History:  Diagnosis Date   Anemia    Anxiety    Bipolar 1 disorder (HCC)    Common migraine 05/19/2015   Depression    Diabetes mellitus, type II (HCC)    Hypertension    Irritable bowel syndrome (IBS)    Mild mental retardation    Obesity    Partial complex seizure disorder with intractable epilepsy (HCC) 05/12/2014   Seizures (HCC)    intractable, sz  08/23/17   Sleep apnea    Stroke (HCC)    Type II or unspecified type diabetes mellitus without mention of complication, not stated as uncontrolled     Past Surgical History:  Procedure Laterality Date   BUBBLE STUDY  11/15/2019   Procedure: BUBBLE STUDY;  Surgeon: Lars Masson, MD;  [facility];  Service: Cardiovascular;;   COLONOSCOPY     2012-normal , Dr Jarold Motto   ESOPHAGOGASTRODUODENOSCOPY     normal-Dr Jarold Motto 2012   LOOP RECORDER INSERTION N/A 05/30/2017   Procedure: Loop Recorder Insertion;  Surgeon: Regan Lemming, MD;  Location: [facility] INVASIVE CV LAB;  Service: Cardiovascular;  Laterality: N/A;   LOOP RECORDER REMOVAL N/A 03/04/2018   Procedure: LOOP RECORDER REMOVAL;  Surgeon: Regan Lemming, MD;  Location: [facility] INVASIVE CV LAB;  Service: Cardiovascular;  Laterality: N/A;   MYRINGOTOMY WITH TUBE PLACEMENT     MYRINGOTOMY WITH TUBE PLACEMENT Right 11/05/2017   Procedure: MYRINGOTOMY WITH TUBE PLACEMENT;  Surgeon: Suzanna Obey, MD;  Location: [facility] OR;  Service: ENT;  Laterality: Right;  right T Tube placement   NASAL SINUS SURGERY     RIGHT HEART CATH AND CORONARY ANGIOGRAPHY N/A 03/08/2024   Procedure: RIGHT HEART CATH AND CORONARY ANGIOGRAPHY;  Surgeon: [name], MD;  Location: [facility] INVASIVE CV LAB;  Service: Cardiovascular;  Laterality: N/A;  TEE WITHOUT CARDIOVERSION N/A 05/30/2017   Procedure: TRANSESOPHAGEAL ECHOCARDIOGRAM (TEE);  Surgeon: Elease Hashimoto Deloris Ping, MD;  Location: [facility] ENDOSCOPY;  Service: Cardiovascular;  Laterality: N/A;   TEE WITHOUT CARDIOVERSION N/A 11/15/2019   Procedure: TRANSESOPHAGEAL ECHOCARDIOGRAM (TEE);  Surgeon: Lars Masson, MD;  Location: [facility] ENDOSCOPY;  Service: Cardiovascular;  Laterality: N/A;    Family Psychiatric History:   Family History:  Family History  Problem Relation Age of Onset   Diabetes Mother        passed away from accidental death   Hypertension Mother    Bipolar disorder Father    Diabetes  Daughter    Leukemia Daughter    Ovarian cancer Maternal Aunt     Social History:   Social History   Socioeconomic History   Marital status: Single    Spouse name: [name]   Number of children: 3   Years of education: 12   Highest education level: Not on file  Occupational History   Occupation: disabled    Employer: DISABLED  Tobacco Use   Smoking status: Never   Smokeless tobacco: Never  Vaping Use   Vaping status: Never Used  Substance and Sexual Activity   Alcohol use: No   Drug use: No   Sexual activity: Yes  Other Topics Concern   Not on file  Social History Narrative   Patient lives at home with daughter.    Patient has 3 children.    Patient is right handed.    Patient has a high school education.    Patient is on disability   Patient drinks 2 cups of caffeine daily.   Social Drivers of Corporate investment banker Strain: Low Risk  (02/04/2024)   Overall Financial Resource Strain (CARDIA)    Difficulty of Paying Living Expenses: Not very hard  Food Insecurity: No Food Insecurity (02/03/2024)   Hunger Vital Sign    Worried About Running Out of Food in the Last Year: Never true    Ran Out of Food in the Last Year: Never true  Transportation Needs: No Transportation Needs (02/03/2024)   PRAPARE - Administrator, Civil Service (Medical): No    Lack of Transportation (Non-Medical): No  Physical Activity: Not on file  Stress: Not on file  Social Connections: Not on file    Additional Social History:   Allergies:   Allergies  Allergen Reactions   Amoxicillin Itching    Did it involve swelling of the face/tongue/throat, SOB, or low BP? No Did it involve sudden or severe rash/hives, skin peeling, or any reaction on the inside of your mouth or nose? No Did you need to seek medical attention at a hospital or doctor's office? No When did it last happen?      within the past 10 years If all above answers are "NO", may proceed with cephalosporin use.     Lisinopril Swelling    Angioedema    Hydrocodone Other (See Comments)    Depressed    Tegretol [Carbamazepine] Swelling    Throat swells    Metabolic Disorder Labs: Lab Results  Component Value Date   HGBA1C 7.3 (H) 02/03/2024   MPG 163 02/03/2024   MPG 180.03 10/31/2020   No results found for: "PROLACTIN" Lab Results  Component Value Date   CHOL 153 02/07/2024   TRIG 123 02/07/2024   HDL 37 (L) 02/07/2024   CHOLHDL 4.1 02/07/2024   VLDL 25 02/07/2024   LDLCALC 91 02/07/2024   LDLCALC 44 04/18/2020  Current Medications: Current Outpatient Medications  Medication Sig Dispense Refill   acetaminophen (TYLENOL) 325 MG tablet Take 2 tablets (650 mg total) by mouth every 4 (four) hours as needed for mild pain (or temp > 37.5 C (99.5 F)).     atorvastatin (LIPITOR) 40 MG tablet TAKE 1 TABLET (40 MG TOTAL) BY MOUTH DAILY AT 6 PM FOR CHOLESTEROL 90 tablet 0   carvedilol (COREG) 6.25 MG tablet Take 6.25 mg by mouth 2 (two) times daily.     chlorhexidine (PERIDEX) 0.12 % solution Use as directed 15 mLs in the mouth or throat daily.     cloBAZam (ONFI) 10 MG tablet Take 10 mg by mouth at bedtime.     cyanocobalamin (VITAMIN B12) 1000 MCG tablet Take 1 tablet (1,000 mcg total) by mouth daily. 90 tablet 0   ferrous sulfate 325 (65 FE) MG tablet Take 325 mg by mouth daily with breakfast.     glipiZIDE (GLUCOTROL) 10 MG tablet Take 10 mg by mouth 2 (two) times daily.     hydrOXYzine (VISTARIL) 25 MG capsule 1 QHS  1 qday PRN 60 capsule 5   isosorbide-hydrALAZINE (BIDIL) 20-37.5 MG tablet Take 0.5 tablets by mouth 3 (three) times daily. 45 tablet 0   lamoTRIgine (LAMICTAL) 100 MG tablet TAKE 3 TABLETS (300 MG TOTAL) BY MOUTH 2 (TWO) TIMES DAILY. (Patient taking differently: Take 200 mg by mouth 2 (two) times daily.) 540 tablet 1   losartan (COZAAR) 50 MG tablet Take 1 tablet (50 mg total) by mouth daily. 90 tablet 3   sitaGLIPtin (JANUVIA) 100 MG tablet Take 100 mg by mouth daily.      spironolactone (ALDACTONE) 25 MG tablet Take 0.5 tablets (12.5 mg total) by mouth daily. 15 tablet 0   torsemide 40 MG TABS Take 40 mg by mouth 2 (two) times daily. 60 tablet 11   Vitamin D, Ergocalciferol, (DRISDOL) 1.25 MG (50000 UNIT) CAPS capsule Take 1 capsule (50,000 Units total) by mouth every 7 (seven) days. 12 capsule 0   ARIPiprazole (ABILIFY) 10 MG tablet Take 0.5 tablets (5 mg total) by mouth daily. 30 tablet 2   No current facility-administered medications for this visit.    Neurologic: Headache: No Seizure: Yes Paresthesias:No  Musculoskeletal: Strength & Muscle Tone: within normal limits Gait & Station: normal Patient leans: Backward and N/A  Psychiatric Specialty Exam: ROS  Blood pressure (!) 137/99, pulse 86, height 5\' 2"  (1.575 m), weight 295 lb (133.8 kg).Body mass index is 53.96 kg/m.  General Appearance: Casual  Eye Contact:  Fair  Speech:  Slurred  Volume:  Normal  Mood:  Dysphoric  Affect:  Appropriate  Thought Process:  Goal Directed  Orientation:  Full (Time, Place, and Person)  Thought Content:  Logical  Suicidal Thoughts:  No  Homicidal Thoughts:  No  Memory:  Negative  Judgement:  Fair  Insight:  Lacking  Psychomotor Activity:  Decreased  Concentration:    Recall:  Fair  Fund of Knowledge:Fair  Language: Fair  Akathisia:  No  Handed:    AIMS (if indicated):    Assets:  Desire for Improvement  ADL's:  Intact  Cognition: WNL  Sleep:      Treatment Plan Summary:   Today overall psychiatrically she is better.  She no longer appears as distressed.  She is not crying.  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  Her spirits are reasonably good and there is no psychotic symptoms present.  Her major problems are really medical in nature.  Today we will continue her 10 mg of Abilify and asked her to come back in 3 months.

## 2024-03-18 ENCOUNTER — Telehealth (HOSPITAL_COMMUNITY): Payer: Self-pay | Admitting: Cardiology

## 2024-03-18 NOTE — Telephone Encounter (Signed)
 Called to confirm/remind patient of their appointment [facility] on 03/18/2024.   Appointment:   [] Confirmed  [x] Left mess   [] No answer/No voice mail  [] Phone not in service  Patient reminded to bring all medications and/or complete list.  Confirmed patient has transportation. Gave directions, instructed to utilize valet parking.

## 2024-03-19 ENCOUNTER — Encounter (HOSPITAL_COMMUNITY): Admitting: Cardiology

## 2024-04-02 ENCOUNTER — Ambulatory Visit (HOSPITAL_BASED_OUTPATIENT_CLINIC_OR_DEPARTMENT_OTHER): Admitting: Family

## 2024-04-05 NOTE — Addendum Note (Signed)
 Addended by: Guss Legacy on: 04/05/2024 11:29 AM   Modules accepted: Orders

## 2024-04-06 ENCOUNTER — Encounter (HOSPITAL_COMMUNITY): Admitting: Cardiology

## 2024-04-07 ENCOUNTER — Telehealth: Payer: Self-pay | Admitting: Neurology

## 2024-04-07 NOTE — Telephone Encounter (Signed)
 Appointment details confirmed

## 2024-04-07 NOTE — Telephone Encounter (Signed)
 Pt states her insurance is no longer covering  lamoTRIgine  (LAMICTAL ) 100 MG tablet, pt has been told to call and ask for another dose or another medication to substitute this medication.  Pt also asked it be noted that she no longer uses [facility], she is only using  [facility](ph#[phone])

## 2024-04-08 NOTE — Telephone Encounter (Signed)
 Please clarify with patient if insurance is not covering Lamotrigine  at all, or just not covering lamotrigine  100 mg but they will cover different strength such as 200 mg

## 2024-04-14 ENCOUNTER — Other Ambulatory Visit: Payer: Self-pay | Admitting: Neurology

## 2024-04-14 MED ORDER — LAMOTRIGINE 200 MG PO TABS: 300.0000 mg | ORAL_TABLET | Freq: Two times a day (BID) | ORAL | 1 refills | Status: DC

## 2024-04-14 NOTE — Telephone Encounter (Signed)
 Tablets switched to 200 mg. Thanks

## 2024-04-14 NOTE — Progress Notes (Signed)
 Medication switched to 200 mg tablet since patient claims that insurance is no longer covering the 100 mg tablet.  Dr. Laiklyn Pilkenton

## 2024-04-19 ENCOUNTER — Other Ambulatory Visit: Payer: Self-pay | Admitting: Neurology

## 2024-04-19 NOTE — Telephone Encounter (Signed)
 Requested Prescriptions   Pending Prescriptions Disp Refills   cloBAZam  (ONFI ) 10 MG tablet [Pharmacy Med Name: CLOBAZAM  10 MG TABS 10 Tablet] 30 tablet 1    Sig: TAKE 1 TABLET BY MOUTH EVERY DAY AT BEDTIME   Last seen 11/03/23, next appt scheduled 06/01/24 Dispenses   Dispensed Days Supply Quantity Provider Pharmacy  CLOBAZAM  10 MG TABS 03/19/2024 30 30 each Wess Hammed, NP [facility] ...  CLOBAZAM  10 MG TABS 03/12/2024 30 30 each Wess Hammed, NP [facility] ...  CLOBAZAM  10 MG TABLET 02/18/2024 30 30 each Wess Hammed, NP [facility] [phone] - [facility]...  CLOBAZAM  10 MG TABLET 01/09/2024 30 30 each Wess Hammed, NP [facility] [phone] - G...  CLOBAZAM  10 MG TABLET 12/10/2023 30 30 each Wess Hammed, NP [facility] [phone] - G...  CLOBAZAM  10 MG TABLET 11/11/2023 30 30 each Wess Hammed, NP [facility] [phone] - G...  CLOBAZAM  10 MG TABLET 10/13/2023 30 30 each Wess Hammed, NP [facility] [phone] - G...  CLOBAZAM  10 MG TABLET 09/15/2023 30 30 each Wess Hammed, NP [facility] [phone] - G...  CLOBAZAM  10 MG TABLET 08/12/2023 30 30 each Wess Hammed, NP [facility] [phone] - G.Aaron AasAaron Aas

## 2024-04-23 ENCOUNTER — Telehealth: Payer: Self-pay | Admitting: Neurology

## 2024-04-23 NOTE — Telephone Encounter (Signed)
 Pt has left a message for sleep lab, she is also asking for a call from RN on suggestions on what she can do about a small amount of sleep she gets nightly.

## 2024-04-23 NOTE — Telephone Encounter (Signed)
 Appointment r/s request

## 2024-04-26 NOTE — Telephone Encounter (Signed)
 Lvm 1st attempt by hf 04/26/24

## 2024-04-26 NOTE — Telephone Encounter (Signed)
 Returned call to pt who stated that she is up a lot at night and sleeping during the day. I advised that we need her to complete her sleep study so we can help her further and gain a better understanding of her sleep issues. Routing to sleep department to schedule for sleep test.

## 2024-04-27 NOTE — Telephone Encounter (Signed)
 I spoke with the patient.  [employer]: [id number] (exp. 04/26/24 to 08/08/24) rs from 02/17/24   She is scheduled for 06/08/24 at 8 pm.  Mailed packet to the patient.

## 2024-05-04 ENCOUNTER — Telehealth: Payer: Self-pay | Admitting: Neurology

## 2024-05-04 NOTE — Telephone Encounter (Signed)
 Pt believes the lamoTRIgine  (LAMICTAL ) 200 MG tablet may be too strong, pt states she had a seizure this morning, later today she stumbled and fell and hit her head on something (pt did not go to ED) please call pt to discuss the dose of the lamoTRIgine  (LAMICTAL ) 200 MG tablet

## 2024-05-04 NOTE — Telephone Encounter (Signed)
 Returned call to pt. She reports medication compliance as prescribed. Reports having seizure this morning at about 2:30 this morning. Pt stated she was laying in her chair and her daughter was calling her name but pt was unable to wake up for about 15-30 minutes. After about 15-30 minutes she was able to wake up. Pt reports having blurry vision this afternoon, resports stumbling and hitting head.  Pt denies convulsion, loss of bowels/bladders, or biting tongue during seizure. Pt reports having a hard time sleeping, only getting about 5 hours of sleep a night.  Pt was advised to follow up with PCP/ER regarding hitting head.  Advised I would send to provider for advice and recommendations.

## 2024-05-05 ENCOUNTER — Other Ambulatory Visit: Payer: Self-pay | Admitting: Neurology

## 2024-05-05 ENCOUNTER — Telehealth: Payer: Self-pay | Admitting: Neurology

## 2024-05-05 DIAGNOSIS — Z5181 Encounter for therapeutic drug level monitoring: Secondary | ICD-10-CM

## 2024-05-05 DIAGNOSIS — G40909 Epilepsy, unspecified, not intractable, without status epilepticus: Secondary | ICD-10-CM

## 2024-05-05 NOTE — Telephone Encounter (Signed)
 She has been on this dose for a long time. Please call the patient and clarify how many tablets she is taking daily and have her stop by the office to get lab work. Continue current medications.

## 2024-05-05 NOTE — Telephone Encounter (Signed)
 Pt called  to request to speak to nurse the patient is concern about medication Dosage being higher. And want to know why was it changed , Pt states medication suppose to be 200 mg 1.5 tablets and to taking by mouth 2 times a day . I in formed Pt of dosage . However Pt would like to speak to Doctor

## 2024-05-06 ENCOUNTER — Emergency Department (HOSPITAL_COMMUNITY)
Admission: EM | Admit: 2024-05-06 | Discharge: 2024-05-06 | Disposition: A | Discharge status: Home / Self Care | Attending: Emergency Medicine | Admitting: Emergency Medicine

## 2024-05-06 ENCOUNTER — Other Ambulatory Visit: Payer: Self-pay

## 2024-05-06 ENCOUNTER — Emergency Department (HOSPITAL_COMMUNITY)

## 2024-05-06 ENCOUNTER — Encounter (HOSPITAL_COMMUNITY): Payer: Self-pay

## 2024-05-06 DIAGNOSIS — R531 Weakness: Secondary | ICD-10-CM | POA: Diagnosis present

## 2024-05-06 DIAGNOSIS — W1830XA Fall on same level, unspecified, initial encounter: Secondary | ICD-10-CM | POA: Insufficient documentation

## 2024-05-06 DIAGNOSIS — W19XXXA Unspecified fall, initial encounter: Secondary | ICD-10-CM

## 2024-05-06 DIAGNOSIS — Z7984 Long term (current) use of oral hypoglycemic drugs: Secondary | ICD-10-CM | POA: Diagnosis not present

## 2024-05-06 DIAGNOSIS — W01190A Fall on same level from slipping, tripping and stumbling with subsequent striking against furniture, initial encounter: Secondary | ICD-10-CM | POA: Insufficient documentation

## 2024-05-06 DIAGNOSIS — E876 Hypokalemia: Secondary | ICD-10-CM | POA: Diagnosis not present

## 2024-05-06 DIAGNOSIS — S0990XA Unspecified injury of head, initial encounter: Secondary | ICD-10-CM | POA: Diagnosis not present

## 2024-05-06 DIAGNOSIS — E119 Type 2 diabetes mellitus without complications: Secondary | ICD-10-CM | POA: Insufficient documentation

## 2024-05-06 DIAGNOSIS — R296 Repeated falls: Secondary | ICD-10-CM | POA: Diagnosis not present

## 2024-05-06 DIAGNOSIS — Z8673 Personal history of transient ischemic attack (TIA), and cerebral infarction without residual deficits: Secondary | ICD-10-CM | POA: Insufficient documentation

## 2024-05-06 DIAGNOSIS — Z79899 Other long term (current) drug therapy: Secondary | ICD-10-CM | POA: Insufficient documentation

## 2024-05-06 DIAGNOSIS — I6782 Cerebral ischemia: Secondary | ICD-10-CM | POA: Insufficient documentation

## 2024-05-06 DIAGNOSIS — I10 Essential (primary) hypertension: Secondary | ICD-10-CM | POA: Diagnosis not present

## 2024-05-06 LAB — COMPREHENSIVE METABOLIC PANEL WITH GFR
ALT: 12 U/L (ref 0–44)
AST: 18 U/L (ref 15–41)
Albumin: 3.1 g/dL — ABNORMAL LOW (ref 3.5–5.0)
Alkaline Phosphatase: 94 U/L (ref 38–126)
Anion gap: 12 (ref 5–15)
BUN: 15 mg/dL (ref 6–20)
CO2: 28 mmol/L (ref 22–32)
Calcium: 8.1 mg/dL — ABNORMAL LOW (ref 8.9–10.3)
Chloride: 98 mmol/L (ref 98–111)
Creatinine, Ser: 1.54 mg/dL — ABNORMAL HIGH (ref 0.44–1.00)
GFR, Estimated: 40 mL/min — ABNORMAL LOW (ref >60)
Glucose, Bld: 123 mg/dL — ABNORMAL HIGH (ref 70–99)
Potassium: 3 mmol/L — ABNORMAL LOW (ref 3.5–5.1)
Sodium: 138 mmol/L (ref 135–145)
Total Bilirubin: 0.5 mg/dL (ref 0.0–1.2)
Total Protein: 7 g/dL (ref 6.5–8.1)

## 2024-05-06 LAB — CBC WITH DIFFERENTIAL/PLATELET
Abs Immature Granulocytes: 0.03 10*3/uL (ref 0.00–0.07)
Basophils Absolute: 0 10*3/uL (ref 0.0–0.1)
Basophils Relative: 1 %
Eosinophils Absolute: 0.2 10*3/uL (ref 0.0–0.5)
Eosinophils Relative: 3 %
HCT: 31.1 % — ABNORMAL LOW (ref 36.0–46.0)
Hemoglobin: 9.7 g/dL — ABNORMAL LOW (ref 12.0–15.0)
Immature Granulocytes: 0 %
Lymphocytes Relative: 30 %
Lymphs Abs: 2.4 10*3/uL (ref 0.7–4.0)
MCH: 28.4 pg (ref 26.0–34.0)
MCHC: 31.2 g/dL (ref 30.0–36.0)
MCV: 91.2 fL (ref 80.0–100.0)
Monocytes Absolute: 0.5 10*3/uL (ref 0.1–1.0)
Monocytes Relative: 6 %
Neutro Abs: 4.9 10*3/uL (ref 1.7–7.7)
Neutrophils Relative %: 60 %
Platelets: 283 10*3/uL (ref 150–400)
RBC: 3.41 MIL/uL — ABNORMAL LOW (ref 3.87–5.11)
RDW: 14.9 % (ref 11.5–15.5)
WBC: 8.1 10*3/uL (ref 4.0–10.5)
nRBC: 0 % (ref 0.0–0.2)

## 2024-05-06 LAB — PROTIME-INR
INR: 1.1 (ref 0.8–1.2)
Prothrombin Time: 14.6 s (ref 11.4–15.2)

## 2024-05-06 LAB — APTT: aPTT: 31 s (ref 24–36)

## 2024-05-06 MED ORDER — ASPIRIN 81 MG PO TBEC
81.0000 mg | DELAYED_RELEASE_TABLET | Freq: Every day | ORAL | Status: DC
  Administered 2024-05-06: 81 mg via ORAL
  Filled 2024-05-06: qty 1

## 2024-05-06 MED ORDER — GADOBUTROL 1 MMOL/ML IV SOLN
10.0000 mL | Freq: Once | INTRAVENOUS | Status: AC | PRN
  Administered 2024-05-06: 10 mL via INTRAVENOUS

## 2024-05-06 MED ORDER — POTASSIUM CHLORIDE CRYS ER 20 MEQ PO TBCR: 20.0000 meq | EXTENDED_RELEASE_TABLET | Freq: Two times a day (BID) | ORAL | 0 refills | Status: DC

## 2024-05-06 MED ORDER — LORAZEPAM 1 MG PO TABS
1.0000 mg | ORAL_TABLET | Freq: Once | ORAL | Status: AC
  Administered 2024-05-06: 1 mg via ORAL
  Filled 2024-05-06: qty 1

## 2024-05-06 NOTE — ED Provider Notes (Signed)
 [place] EMERGENCY DEPARTMENT AT [facility] Provider Note   CSN: [id number] Arrival date & time: 05/06/24  1224     History  Chief Complaint  Patient presents with   Weakness   Dizziness    Brandi Gomez is a 53 y.o. female history of diabetes, hypertension, obesity, seizures, stroke, anxiety presents with complaints of multiple falls.  Patient states starting yesterday she started falling.  Notes that yesterday she felt lightheaded like she was going to pass out and fell and hit her head on a wall.  She did not lose consciousness.  Today she states she lost balance and fell and hit her head on a glass table.  Again she did not lose consciousness.  She had no preceding chest pain or shortness of breath.  She denies any vomiting, vision changes or extremity weakness.  Does not feel sick in any way.   Weakness Associated symptoms: dizziness   Dizziness Associated symptoms: weakness    Past Medical History:  Diagnosis Date   Anemia    Anxiety    Bipolar 1 disorder (HCC)    Common migraine 05/19/2015   Depression    Diabetes mellitus, type II (HCC)    Hypertension    Irritable bowel syndrome (IBS)    Mild mental retardation    Obesity    Partial complex seizure disorder with intractable epilepsy (HCC) 05/12/2014   Seizures (HCC)    intractable, sz 08/23/17   Sleep apnea    Stroke (HCC)    Type II or unspecified type diabetes mellitus without mention of complication, not stated as uncontrolled    Past Surgical History:  Procedure Laterality Date   BUBBLE STUDY  11/15/2019   Procedure: BUBBLE STUDY;  Surgeon: Liza Riggers, MD;  Location: [facility] ENDOSCOPY;  Service: Cardiovascular;;   COLONOSCOPY     2012-normal , Dr Adan Holms   ESOPHAGOGASTRODUODENOSCOPY     normal-Dr Adan Holms 2012   LOOP RECORDER INSERTION N/A 05/30/2017   Procedure: Loop Recorder Insertion;  Surgeon: Lei Pump, MD;  Location: [facility] INVASIVE CV LAB;  Service: Cardiovascular;   Laterality: N/A;   LOOP RECORDER REMOVAL N/A 03/04/2018   Procedure: LOOP RECORDER REMOVAL;  Surgeon: Lei Pump, MD;  Location: [facility] INVASIVE CV LAB;  Service: Cardiovascular;  Laterality: N/A;   MYRINGOTOMY WITH TUBE PLACEMENT     MYRINGOTOMY WITH TUBE PLACEMENT Right 11/05/2017   Procedure: MYRINGOTOMY WITH TUBE PLACEMENT;  Surgeon: Vernadine Golas, MD;  Location: [name] OR;  Service: ENT;  Laterality: Right;  right T Tube placement   NASAL SINUS SURGERY     RIGHT HEART CATH AND CORONARY ANGIOGRAPHY N/A 03/08/2024   Procedure: RIGHT HEART CATH AND CORONARY ANGIOGRAPHY;  Surgeon: Lauralee Poll, MD;  Location: [facility] INVASIVE CV LAB;  Service: Cardiovascular;  Laterality: N/A;   TEE WITHOUT CARDIOVERSION N/A 05/30/2017   Procedure: TRANSESOPHAGEAL ECHOCARDIOGRAM (TEE);  Surgeon: Alroy Aspen Lela Purple, MD;  Location: [facility] ENDOSCOPY;  Service: Cardiovascular;  Laterality: N/A;   TEE WITHOUT CARDIOVERSION N/A 11/15/2019   Procedure: TRANSESOPHAGEAL ECHOCARDIOGRAM (TEE);  Surgeon: Liza Riggers, MD;  Location: [facility] ENDOSCOPY;  Service: Cardiovascular;  Laterality: N/A;       Home Medications Prior to Admission medications   Medication Sig Start Date End Date Taking? Authorizing Provider  potassium chloride  SA (KLOR-CON  M) 20 MEQ tablet Take 1 tablet (20 mEq total) by mouth 2 (two) times daily. 05/06/24  Yes Felicie Horning, PA-C  acetaminophen  (TYLENOL ) 325 MG tablet Take 2 tablets (650  mg total) by mouth every 4 (four) hours as needed for mild pain (or temp > 37.5 C (99.5 F)). 10/19/19   Angiulli, Everlyn Hockey, PA-C  ARIPiprazole  (ABILIFY ) 10 MG tablet Take 0.5 tablets (5 mg total) by mouth daily. 03/17/24   Plovsky, Doroteo Gasmen, MD  atorvastatin  (LIPITOR) 40 MG tablet TAKE 1 TABLET (40 MG TOTAL) BY MOUTH DAILY AT 6 PM FOR CHOLESTEROL 09/24/21   Sagardia, Isidro Margo, MD  carvedilol  (COREG ) 6.25 MG tablet Take 6.25 mg by mouth 2 (two) times daily. 05/20/21   [provider]  chlorhexidine (PERIDEX)  0.12 % solution Use as directed 15 mLs in the mouth or throat daily.    [provider]  cloBAZam  (ONFI ) 10 MG tablet TAKE 1 TABLET BY MOUTH EVERY DAY AT BEDTIME 04/19/24   Wess Hammed, NP  cyanocobalamin  (VITAMIN B12) 1000 MCG tablet Take 1 tablet (1,000 mcg total) by mouth daily. 02/08/24   Wynetta Heckle, MD  ferrous sulfate  325 (65 FE) MG tablet Take 325 mg by mouth daily with breakfast. 07/10/22   [provider]  glipiZIDE  (GLUCOTROL ) 10 MG tablet Take 10 mg by mouth 2 (two) times daily. 04/27/22   [provider]  hydrOXYzine  (VISTARIL ) 25 MG capsule 1 QHS  1 qday PRN 02/12/23   Plovsky, Doroteo Gasmen, MD  isosorbide -hydrALAZINE  (BIDIL ) 20-37.5 MG tablet Take 0.5 tablets by mouth 3 (three) times daily. 02/08/24   Wynetta Heckle, MD  lamoTRIgine  (LAMICTAL ) 200 MG tablet Take 1.5 tablets (300 mg total) by mouth 2 (two) times daily. 04/14/24   Camara, Amadou, MD  losartan  (COZAAR ) 50 MG tablet Take 1 tablet (50 mg total) by mouth daily. 03/08/24 06/06/24  Lauralee Poll, MD  sitaGLIPtin  (JANUVIA ) 100 MG tablet Take 100 mg by mouth daily.    [provider]  spironolactone  (ALDACTONE ) 25 MG tablet Take 0.5 tablets (12.5 mg total) by mouth daily. 02/09/24   Wynetta Heckle, MD  torsemide  40 MG TABS Take 40 mg by mouth 2 (two) times daily. 03/08/24   Lauralee Poll, MD  Vitamin D , Ergocalciferol , (DRISDOL ) 1.25 MG (50000 UNIT) CAPS capsule Take 1 capsule (50,000 Units total) by mouth every 7 (seven) days. 07/03/23   Wess Hammed, NP  methylphenidate  (RITALIN ) 5 MG tablet Take 1 tablet (5 mg total) by mouth 2 (two) times daily. Patient not taking: Reported on 12/29/2019 10/29/19 01/01/20  [name], MD      Allergies    Amoxicillin , Lisinopril , Hydrocodone , and Tegretol  [carbamazepine ]    Review of Systems   Review of Systems  Neurological:  Positive for dizziness and weakness.    Physical Exam Updated Vital Signs BP 122/71 (BP Location: Left Arm)   Pulse 72    Temp 97.9 F (36.6 C) (Oral)   Resp 12   Ht 5\' 3"  (1.6 m)   LMP  (LMP Unknown)   SpO2 97%   BMI 52.26 kg/m  Physical Exam Vitals and nursing note reviewed.  Constitutional:      General: She is not in acute distress.    Appearance: She is well-developed.  HENT:     Head: Normocephalic and atraumatic.  Eyes:     Conjunctiva/sclera: Conjunctivae normal.  Cardiovascular:     Rate and Rhythm: Normal rate and regular rhythm.     Heart sounds: No murmur heard. Pulmonary:     Effort: Pulmonary effort is normal. No respiratory distress.     Breath sounds: Normal breath sounds.  Abdominal:  Palpations: Abdomen is soft.     Tenderness: There is no abdominal tenderness.  Musculoskeletal:        General: No swelling.     Cervical back: Neck supple.  Skin:    General: Skin is warm and dry.     Capillary Refill: Capillary refill takes less than 2 seconds.  Neurological:     Mental Status: She is alert.     Comments: Patient is alert and oriented. There is no abnormal phonation. Symmetric smile without facial droop. No pronator drift. Moves all extremities spontaneously. 5/5 strength in upper and lower extremities. . No sensation deficit. There is no nystagmus. EOMI, PERRL. Coordination intact with finger to nose.    Psychiatric:        Mood and Affect: Mood normal.     ED Results / Procedures / Treatments   Labs (all labs ordered are listed, but only abnormal results are displayed) Labs Reviewed  CBC WITH DIFFERENTIAL/PLATELET - Abnormal; Notable for the following components:      Result Value   RBC 3.41 (*)    Hemoglobin 9.7 (*)    HCT 31.1 (*)    All other components within normal limits  COMPREHENSIVE METABOLIC PANEL WITH GFR - Abnormal; Notable for the following components:   Potassium 3.0 (*)    Glucose, Bld 123 (*)    Creatinine, Ser 1.54 (*)    Calcium  8.1 (*)    Albumin 3.1 (*)    GFR, Estimated 40 (*)    All other components within normal limits  PROTIME-INR   APTT    EKG EKG Interpretation Date/Time:  Thursday May 06 2024 12:29:20 EDT Ventricular Rate:  74 PR Interval:  208 QRS Duration:  105 QT Interval:  414 QTC Calculation: 460 R Axis:   34  Text Interpretation: Sinus rhythm Borderline prolonged PR interval Nonspecific T abnrm, anterolateral leads Confirmed by Hiawatha Lout ([id number]) on 05/06/2024 4:22:35 PM  Radiology [name] Result Date: 05/06/2024 CLINICAL DATA:  Ataxia EXAM: MRI HEAD WITHOUT CONTRAST MRA HEAD WITHOUT CONTRAST MRA OF THE NECK WITHOUT AND WITH CONTRAST TECHNIQUE: Multiplanar, multi-echo pulse sequences of the brain and surrounding structures were acquired without intravenous contrast. Angiographic images of the Circle of Willis were acquired using MRA technique without intravenous contrast. Angiographic images of the neck were acquired using MRA technique without and with intravenous contrast. Carotid stenosis measurements (when applicable) are obtained utilizing NASCET criteria, using the distal internal carotid diameter as the denominator. CONTRAST:  10mL GADAVIST GADOBUTROL 1 MMOL/ML IV SOLN COMPARISON:  10/12/2019 FINDINGS: MR HEAD FINDINGS Brain: No acute infarct, mass effect or extra-axial collection. Basal ganglia mineralization. There is multifocal hyperintense T2-weighted signal within the white matter. Parenchymal volume and CSF spaces are normal. Old subcortical infarcts of the right frontal and parietal lobes. The midline structures are normal. Vascular: Normal flow voids. Skull and upper cervical spine: Normal calvarium and skull base. Visualized upper cervical spine and soft tissues are normal. Sinuses/Orbits:No paranasal sinus fluid levels or advanced mucosal thickening. No mastoid or middle ear effusion. Normal orbits. MRA HEAD FINDINGS POSTERIOR CIRCULATION: Vertebral arteries are normal. No proximal occlusion of the anterior or inferior cerebellar arteries. Basilar artery is normal. Superior  cerebellar arteries are normal. Posterior cerebral arteries are normal. ANTERIOR CIRCULATION: Intracranial internal carotid arteries are normal. Anterior cerebral arteries are normal. Middle cerebral arteries are normal. Anatomic Variants: None MRA NECK FINDINGS Aortic arch: Normal 3 vessel branching pattern Right carotid system: Normal Left carotid system: Normal Vertebral  arteries: Codominant vertebral arteries.  Normal. Other: None. IMPRESSION: 1. No acute intracranial abnormality. 2. Old subcortical infarcts of the right frontal and parietal lobes. 3. Normal MRA of the head and neck. Electronically Signed   By: Juanetta Nordmann M.D.   On: 05/06/2024 21:31   MR ANGIO HEAD WO CONTRAST Result Date: 05/06/2024 CLINICAL DATA:  Ataxia EXAM: MRI HEAD WITHOUT CONTRAST MRA HEAD WITHOUT CONTRAST MRA OF THE NECK WITHOUT AND WITH CONTRAST TECHNIQUE: Multiplanar, multi-echo pulse sequences of the brain and surrounding structures were acquired without intravenous contrast. Angiographic images of the Circle of Willis were acquired using MRA technique without intravenous contrast. Angiographic images of the neck were acquired using MRA technique without and with intravenous contrast. Carotid stenosis measurements (when applicable) are obtained utilizing NASCET criteria, using the distal internal carotid diameter as the denominator. CONTRAST:  10mL GADAVIST GADOBUTROL 1 MMOL/ML IV SOLN COMPARISON:  10/12/2019 FINDINGS: MR HEAD FINDINGS Brain: No acute infarct, mass effect or extra-axial collection. Basal ganglia mineralization. There is multifocal hyperintense T2-weighted signal within the white matter. Parenchymal volume and CSF spaces are normal. Old subcortical infarcts of the right frontal and parietal lobes. The midline structures are normal. Vascular: Normal flow voids. Skull and upper cervical spine: Normal calvarium and skull base. Visualized upper cervical spine and soft tissues are normal. Sinuses/Orbits:No paranasal  sinus fluid levels or advanced mucosal thickening. No mastoid or middle ear effusion. Normal orbits. MRA HEAD FINDINGS POSTERIOR CIRCULATION: Vertebral arteries are normal. No proximal occlusion of the anterior or inferior cerebellar arteries. Basilar artery is normal. Superior cerebellar arteries are normal. Posterior cerebral arteries are normal. ANTERIOR CIRCULATION: Intracranial internal carotid arteries are normal. Anterior cerebral arteries are normal. Middle cerebral arteries are normal. Anatomic Variants: None MRA NECK FINDINGS Aortic arch: Normal 3 vessel branching pattern Right carotid system: Normal Left carotid system: Normal Vertebral arteries: Codominant vertebral arteries.  Normal. Other: None. IMPRESSION: 1. No acute intracranial abnormality. 2. Old subcortical infarcts of the right frontal and parietal lobes. 3. Normal MRA of the head and neck. Electronically Signed   By: Juanetta Nordmann M.D.   On: 05/06/2024 21:31   MR Angiogram Neck W or Wo Contrast Result Date: 05/06/2024 CLINICAL DATA:  Ataxia EXAM: MRI HEAD WITHOUT CONTRAST MRA HEAD WITHOUT CONTRAST MRA OF THE NECK WITHOUT AND WITH CONTRAST TECHNIQUE: Multiplanar, multi-echo pulse sequences of the brain and surrounding structures were acquired without intravenous contrast. Angiographic images of the Circle of Willis were acquired using MRA technique without intravenous contrast. Angiographic images of the neck were acquired using MRA technique without and with intravenous contrast. Carotid stenosis measurements (when applicable) are obtained utilizing NASCET criteria, using the distal internal carotid diameter as the denominator. CONTRAST:  10mL GADAVIST GADOBUTROL 1 MMOL/ML IV SOLN COMPARISON:  10/12/2019 FINDINGS: MR HEAD FINDINGS Brain: No acute infarct, mass effect or extra-axial collection. Basal ganglia mineralization. There is multifocal hyperintense T2-weighted signal within the white matter. Parenchymal volume and CSF spaces are  normal. Old subcortical infarcts of the right frontal and parietal lobes. The midline structures are normal. Vascular: Normal flow voids. Skull and upper cervical spine: Normal calvarium and skull base. Visualized upper cervical spine and soft tissues are normal. Sinuses/Orbits:No paranasal sinus fluid levels or advanced mucosal thickening. No mastoid or middle ear effusion. Normal orbits. MRA HEAD FINDINGS POSTERIOR CIRCULATION: Vertebral arteries are normal. No proximal occlusion of the anterior or inferior cerebellar arteries. Basilar artery is normal. Superior cerebellar arteries are normal. Posterior cerebral arteries are normal. ANTERIOR CIRCULATION: Intracranial internal  carotid arteries are normal. Anterior cerebral arteries are normal. Middle cerebral arteries are normal. Anatomic Variants: None MRA NECK FINDINGS Aortic arch: Normal 3 vessel branching pattern Right carotid system: Normal Left carotid system: Normal Vertebral arteries: Codominant vertebral arteries.  Normal. Other: None. IMPRESSION: 1. No acute intracranial abnormality. 2. Old subcortical infarcts of the right frontal and parietal lobes. 3. Normal MRA of the head and neck. Electronically Signed   By: Juanetta Nordmann M.D.   On: 05/06/2024 21:31   CT Head Wo Contrast Result Date: 05/06/2024 CLINICAL DATA:  Head trauma, focal neuro findings (Age 74-64y). Generalized weakness and dizziness with a fall yesterday, hitting head on a glass table. EXAM: CT HEAD WITHOUT CONTRAST TECHNIQUE: Contiguous axial images were obtained from the base of the skull through the vertex without intravenous contrast. RADIATION DOSE REDUCTION: This exam was performed according to the departmental dose-optimization program which includes automated exposure control, adjustment of the mA and/or kV according to patient size and/or use of iterative reconstruction technique. COMPARISON:  Head CT 06/30/2019 and MRI 10/12/2019 FINDINGS: Brain: There is no evidence of an  acute infarct, intracranial hemorrhage, mass, midline shift, or extra-axial fluid collection. Cerebral volume is normal. The ventricles are normal in size. Patchy hypodensities in the cerebral white matter bilaterally may have mildly progressed and are nonspecific but compatible with mild-to-moderate chronic small vessel ischemic disease. Vascular: No hyperdense vessel. Skull: No acute fracture or suspicious lesion. Sinuses/Orbits: Visualized paranasal sinuses are clear. Small chronic left mastoid effusion. Unremarkable orbits. Other: None. IMPRESSION: 1. No evidence of acute intracranial abnormality. 2. Mild-to-moderate chronic small vessel ischemic disease. Electronically Signed   By: Aundra Lee M.D.   On: 05/06/2024 16:07    Procedures Procedures    Medications Ordered in ED Medications  aspirin  EC tablet 81 mg (81 mg Oral Given 05/06/24 1744)  LORazepam  (ATIVAN ) tablet 1 mg (1 mg Oral Given 05/06/24 1843)  gadobutrol (GADAVIST) 1 MMOL/ML injection 10 mL (10 mLs Intravenous Contrast Given 05/06/24 1947)    ED Course/ Medical Decision Making/ A&P                                 Medical Decision Making Amount and/or Complexity of Data Reviewed Labs: ordered. Radiology: ordered.  Risk OTC drugs. Prescription drug management.   This patient presents to the ED with chief complaint(s) of falls.  The complaint involves an extensive differential diagnosis and also carries with it a high risk of complications and morbidity.   Pertinent past medical history as listed in HPI  The differential diagnosis includes  CVA, TIA,  Additional history obtained: Records reviewed Care Everywhere/External Records  Assessment and management:   Hemodynamically stable, afebrile, nontoxic-appearing patient presenting with complaints of multiple falls.  First fall occurred yesterday when she felt lightheaded like she was going to pass out, and fell and hit her head against a wall.  She did not lose  consciousness.  She had no preceding chest pain or shortness of breath.  Today she states she lost balance and fell and hit her head on a glass table.  Again she did not lose consciousness.  She attributes a second fall to residual left lower extremity deficits from prior stroke.  She is not complaining of any vision changes or headache.   Independent ECG interpretation:  Sinus rhythm, borderline T wave abnormality  Independent labs interpretation:  The following labs were independently interpreted:  CMP with mild  hypokalemia L3, creatinine elevated but near baseline, CBC without significant abnormality, PT/INR within normal limits  Independent visualization and interpretation of imaging: I independently visualized the following imaging with scope of interpretation limited to determining acute life threatening conditions related to emergency care:  CT head no evidence of acute intracranial abnormality, moderate small vessel chronic ischemic disease MRI brain/MRA head and neck without acute abnormality  Reviewed imaging and lab results with patient.  Overall very reassuring.  She has a neurologist that she will follow-up with.  Will send in potassium supplementation to her pharmacy and have her follow-up with her primary as well.   Consultations obtained:   Neurology- Dr. Alecia Ames recommended MRI brain without, mri head and neck, ASA 81  Disposition:   Patient will be discharged home. The patient has been appropriately medically screened and/or stabilized in the ED. I have low suspicion for any other emergent medical condition which would require further screening, evaluation or treatment in the ED or require inpatient management. At time of discharge the patient is hemodynamically stable and in no acute distress. I have discussed work-up results and diagnosis with patient and answered all questions. Patient is agreeable with discharge plan. We discussed strict return precautions for returning  to the emergency department and they verbalized understanding.     Social Determinants of Health:   none  This note was dictated with voice recognition software.  Despite best efforts at proofreading, errors may have occurred which can change the documentation meaning.           Final Clinical Impression(s) / ED Diagnoses Final diagnoses:  Fall, initial encounter  Hypokalemia    Rx / DC Orders ED Discharge Orders          Ordered    potassium chloride  SA (KLOR-CON  M) 20 MEQ tablet  2 times daily        05/06/24 2150              Stanton Earthly 05/06/24 2150    Mordecai Applebaum, MD 05/07/24 [phone]

## 2024-05-06 NOTE — Telephone Encounter (Signed)
 Discussed with April and Dr. Samara Crest . Pt is taking medication as prescribed. Documented in another phone note.

## 2024-05-06 NOTE — ED Triage Notes (Signed)
 Pt from home with ems c.o generalized weakness and dizziness since yesterday. Pt did have a fall yesterday, hitting her head on a glass table, the glass did not break. Pt had some orthostatic changes when standing. Pt given 400ml fluid PTA. Pt arrives a/o ,anxious and tearful

## 2024-05-06 NOTE — ED Notes (Signed)
 This RN with the help of Royston Cornea PA, ambulated pt up and down hallway. Pt walked with steady gait, did not voice any concerns while walking.

## 2024-05-06 NOTE — Discharge Instructions (Signed)
 You were evaluated in the emergency room following a fall.  Your imaging did not show any evidence of a brain bleed or stroke.  Your lab work showed a mildly low potassium.  Supplementation was sent into your pharmacy.  Please follow-up with your primary care doctor and your neurologist.

## 2024-05-06 NOTE — Telephone Encounter (Signed)
 Called to pt. Confirmed she is taking correct dose of Lamictal  200 mg 1.5 tablets 2x daily for a total of 300 mg 2x daily. She verbalized understanding and agreed to lab work. Lab hours provided.

## 2024-05-06 NOTE — ED Notes (Signed)
 Pt transported to MRI

## 2024-05-13 ENCOUNTER — Other Ambulatory Visit: Payer: Self-pay | Admitting: Nephrology

## 2024-05-13 DIAGNOSIS — N1832 Chronic kidney disease, stage 3b: Secondary | ICD-10-CM

## 2024-05-17 ENCOUNTER — Other Ambulatory Visit: Payer: Medicare HMO

## 2024-05-27 ENCOUNTER — Telehealth: Payer: Self-pay | Admitting: Neurology

## 2024-05-27 NOTE — Telephone Encounter (Signed)
 Patient called to verify appointment on 06/01/24 is for memory. Informed patient appointment is for memory,

## 2024-06-01 ENCOUNTER — Other Ambulatory Visit

## 2024-06-01 ENCOUNTER — Ambulatory Visit (INDEPENDENT_AMBULATORY_CARE_PROVIDER_SITE_OTHER): Admitting: Neurology

## 2024-06-01 ENCOUNTER — Encounter: Payer: Self-pay | Admitting: Neurology

## 2024-06-01 ENCOUNTER — Telehealth: Payer: Self-pay | Admitting: Neurology

## 2024-06-01 VITALS — BP 134/82 | Ht 61.0 in | Wt 289.0 lb

## 2024-06-01 DIAGNOSIS — G40909 Epilepsy, unspecified, not intractable, without status epilepticus: Secondary | ICD-10-CM | POA: Diagnosis not present

## 2024-06-01 DIAGNOSIS — G3184 Mild cognitive impairment, so stated: Secondary | ICD-10-CM | POA: Diagnosis not present

## 2024-06-01 DIAGNOSIS — E538 Deficiency of other specified B group vitamins: Secondary | ICD-10-CM | POA: Diagnosis not present

## 2024-06-01 DIAGNOSIS — G4733 Obstructive sleep apnea (adult) (pediatric): Secondary | ICD-10-CM | POA: Diagnosis not present

## 2024-06-01 NOTE — Progress Notes (Signed)
 [facility]  PATIENT: Brandi Gomez DOB: 01-Jan-1971  REQUESTING CLINICIAN: Leontine Cramp, NP HISTORY FROM: Patient  REASON FOR VISIT: Memory loss    HISTORICAL  CHIEF COMPLAINT:  Chief Complaint  Patient presents with   New Patient (Initial Visit)    Rm 13,  for memory concerns    HISTORY OF PRESENT ILLNESS:  This is a 53 year old woman past medical history of hypertension, hyperlipidemia, obesity, sleep apnea, seizure disorder bipolar disorder who is presenting with memory loss.  Patient described her memory loss as difficulty remembering, problem with short-term memory.  This has been going on for the past year and getting worse.  She tells me that she forgets really very fast what she was told.  Sometime when using her phone to text you my text around the information. Sometimes, she even forgets her birthday.  She is accompanied by her boyfriend who also mentions the patient is very forgetful, she does not have much activity, stays home most of the day. TBI:   No past history of TBI Stroke:   no past history of stroke Seizures: Yes  Sleep: Yes, using her CPAP Mood: Yes, Bipolar disorder  Family history of Dementia:  Denies  Functional status: independent in all ADLs and IADLs Patient lives with boyfriend. Cooking: does not cook  Cleaning: not much Shopping: not much  Bathing: no help needed  Toileting: no help needed  Driving: no Bills: boyfriend help Medications: patient, pharmacy puts all meds in bubble wrap Ever left the stove on by accident?: denies Forget how to use items around the house?: denies Getting lost going to familiar places?: denies Forgetting loved ones names?: denies Word finding difficulty? Yes  Sleep: not good    OTHER MEDICAL CONDITIONS: Bipolar disorder, Seizure disorder, Hypertension, Hyperlipidemia, Diabetes, Obesity,    REVIEW OF SYSTEMS: Full 14 system review of systems performed and negative with exception of: As  noted in the HPI   ALLERGIES: Allergies  Allergen Reactions   Amoxicillin  Itching    Did it involve swelling of the face/tongue/throat, SOB, or low BP? No Did it involve sudden or severe rash/hives, skin peeling, or any reaction on the inside of your mouth or nose? No Did you need to seek medical attention at a hospital or doctor's office? No When did it last happen?      within the past 10 years If all above answers are NO, may proceed with cephalosporin use.    Lisinopril  Swelling    Angioedema    Hydrocodone  Other (See Comments)    Depressed    Tegretol  [Carbamazepine ] Swelling    Throat swells    HOME MEDICATIONS: Outpatient Medications Prior to Visit  Medication Sig Dispense Refill   acetaminophen  (TYLENOL ) 325 MG tablet Take 2 tablets (650 mg total) by mouth every 4 (four) hours as needed for mild pain (or temp > 37.5 C (99.5 F)).     ARIPiprazole  (ABILIFY ) 10 MG tablet Take 0.5 tablets (5 mg total) by mouth daily. 30 tablet 2   atorvastatin  (LIPITOR) 40 MG tablet TAKE 1 TABLET (40 MG TOTAL) BY MOUTH DAILY AT 6 PM FOR CHOLESTEROL 90 tablet 0   carvedilol  (COREG ) 6.25 MG tablet Take 6.25 mg by mouth 2 (two) times daily.     chlorhexidine (PERIDEX) 0.12 % solution Use as directed 15 mLs in the mouth or throat daily.     cloBAZam  (ONFI ) 10 MG tablet TAKE 1 TABLET BY MOUTH EVERY DAY AT BEDTIME 30 tablet 5  cyanocobalamin  (VITAMIN B12) 1000 MCG tablet Take 1 tablet (1,000 mcg total) by mouth daily. 90 tablet 0   ferrous sulfate  325 (65 FE) MG tablet Take 325 mg by mouth daily with breakfast.     glipiZIDE  (GLUCOTROL ) 10 MG tablet Take 10 mg by mouth 2 (two) times daily.     hydrOXYzine  (VISTARIL ) 25 MG capsule 1 QHS  1 qday PRN 60 capsule 5   isosorbide -hydrALAZINE  (BIDIL ) 20-37.5 MG tablet Take 0.5 tablets by mouth 3 (three) times daily. 45 tablet 0   lamoTRIgine  (LAMICTAL ) 200 MG tablet Take 1.5 tablets (300 mg total) by mouth 2 (two) times daily. 270 tablet 1   losartan   (COZAAR ) 50 MG tablet Take 1 tablet (50 mg total) by mouth daily. 90 tablet 3   potassium chloride  SA (KLOR-CON  M) 20 MEQ tablet Take 1 tablet (20 mEq total) by mouth 2 (two) times daily. 6 tablet 0   sitaGLIPtin  (JANUVIA ) 100 MG tablet Take 100 mg by mouth daily.     spironolactone  (ALDACTONE ) 25 MG tablet Take 0.5 tablets (12.5 mg total) by mouth daily. 15 tablet 0   torsemide  40 MG TABS Take 40 mg by mouth 2 (two) times daily. 60 tablet 11   Vitamin D , Ergocalciferol , (DRISDOL ) 1.25 MG (50000 UNIT) CAPS capsule Take 1 capsule (50,000 Units total) by mouth every 7 (seven) days. 12 capsule 0   No facility-administered medications prior to visit.    PAST MEDICAL HISTORY: Past Medical History:  Diagnosis Date   Anemia    Anxiety    Bipolar 1 disorder (HCC)    Common migraine 05/19/2015   Depression    Diabetes mellitus, type II (HCC)    Hypertension    Irritable bowel syndrome (IBS)    Mild mental retardation    Obesity    Partial complex seizure disorder with intractable epilepsy (HCC) 05/12/2014   Seizures (HCC)    intractable, sz 08/23/17   Sleep apnea    Stroke (HCC)    Type II or unspecified type diabetes mellitus without mention of complication, not stated as uncontrolled     PAST SURGICAL HISTORY: Past Surgical History:  Procedure Laterality Date   BUBBLE STUDY  11/15/2019   Procedure: BUBBLE STUDY;  Surgeon: Maranda Leim DEL, MD;  Location: [facility] ENDOSCOPY;  Service: Cardiovascular;;   COLONOSCOPY     2012-normal , Dr Jakie   ESOPHAGOGASTRODUODENOSCOPY     normal-Dr Jakie 2012   LOOP RECORDER INSERTION N/A 05/30/2017   Procedure: Loop Recorder Insertion;  Surgeon: Inocencio Soyla Lunger, MD;  Location: [facility] INVASIVE CV LAB;  Service: Cardiovascular;  Laterality: N/A;   LOOP RECORDER REMOVAL N/A 03/04/2018   Procedure: LOOP RECORDER REMOVAL;  Surgeon: Inocencio Soyla Lunger, MD;  Location: [facility] INVASIVE CV LAB;  Service: Cardiovascular;  Laterality: N/A;   MYRINGOTOMY WITH  TUBE PLACEMENT     MYRINGOTOMY WITH TUBE PLACEMENT Right 11/05/2017   Procedure: MYRINGOTOMY WITH TUBE PLACEMENT;  Surgeon: Roark Rush, MD;  Location: [facility] OR;  Service: ENT;  Laterality: Right;  right T Tube placement   NASAL SINUS SURGERY     RIGHT HEART CATH AND CORONARY ANGIOGRAPHY N/A 03/08/2024   Procedure: RIGHT HEART CATH AND CORONARY ANGIOGRAPHY;  Surgeon: Zenaida Morene PARAS, MD;  Location: [facility] INVASIVE CV LAB;  Service: Cardiovascular;  Laterality: N/A;   TEE WITHOUT CARDIOVERSION N/A 05/30/2017   Procedure: TRANSESOPHAGEAL ECHOCARDIOGRAM (TEE);  Surgeon: Alveta Aleene PARAS, MD;  Location: [facility] ENDOSCOPY;  Service: Cardiovascular;  Laterality: N/A;   TEE WITHOUT CARDIOVERSION  N/A 11/15/2019   Procedure: TRANSESOPHAGEAL ECHOCARDIOGRAM (TEE);  Surgeon: Maranda Leim DEL, MD;  Location: [facility] ENDOSCOPY;  Service: Cardiovascular;  Laterality: N/A;    FAMILY HISTORY: Family History  Problem Relation Age of Onset   Diabetes Mother        passed away from accidental death   Hypertension Mother    Bipolar disorder Father    Diabetes Daughter    Leukemia Daughter    Ovarian cancer Maternal Aunt     SOCIAL HISTORY: Social History   Socioeconomic History   Marital status: Single    Spouse name: Elsie   Number of children: 3   Years of education: 12   Highest education level: Not on file  Occupational History   Occupation: disabled    Employer: DISABLED  Tobacco Use   Smoking status: Never   Smokeless tobacco: Never  Vaping Use   Vaping status: Never Used  Substance and Sexual Activity   Alcohol use: No   Drug use: No   Sexual activity: Yes  Other Topics Concern   Not on file  Social History Narrative   Patient lives at home with daughter.    Patient has 3 children.    Patient is right handed.    Patient has a high school education.    Patient is on disability   Patient drinks 2 cups of caffeine daily.   Social Drivers of Corporate investment banker Strain: Low Risk   (02/04/2024)   Overall Financial Resource Strain (CARDIA)    Difficulty of Paying Living Expenses: Not very hard  Food Insecurity: No Food Insecurity (02/03/2024)   Hunger Vital Sign    Worried About Running Out of Food in the Last Year: Never true    Ran Out of Food in the Last Year: Never true  Transportation Needs: No Transportation Needs (02/03/2024)   PRAPARE - Administrator, Civil Service (Medical): No    Lack of Transportation (Non-Medical): No  Physical Activity: Not on file  Stress: Not on file  Social Connections: Not on file  Intimate Partner Violence: Not At Risk (02/03/2024)   Humiliation, Afraid, Rape, and Kick questionnaire    Fear of Current or Ex-Partner: No    Emotionally Abused: No    Physically Abused: No    Sexually Abused: No    PHYSICAL EXAM   GENERAL EXAM/CONSTITUTIONAL: Vitals:  Vitals:   06/01/24 1321  BP: 134/82  Weight: 289 lb (131.1 kg)  Height: 5' 1 (1.549 m)   Body mass index is 54.61 kg/m. Wt Readings from Last 3 Encounters:  06/01/24 289 lb (131.1 kg)  03/08/24 296 lb (134.3 kg)  03/01/24 297 lb 11.2 oz (135 kg)   Patient is in no distress; well developed, nourished and groomed; neck is supple, Obese woman   MUSCULOSKELETAL: Gait, strength, tone, movements noted in Neurologic exam below  NEUROLOGIC: MENTAL STATUS:     06/01/2024    1:27 PM  MMSE - Mini Mental State Exam  Orientation to time 4  Orientation to Place 5  Registration 3  Attention/ Calculation 1  Recall 0  Language- name 2 objects 2  Language- repeat 0  Language- follow 3 step command 2  Language- read & follow direction 1  Write a sentence 0  Copy design 0  Total score 18    CRANIAL NERVE:  2nd, 3rd, 4th, 6th- visual fields full to confrontation, extraocular muscles intact, no nystagmus 5th - facial sensation symmetric 7th - facial strength  symmetric 8th - hearing intact 9th - palate elevates symmetrically, uvula midline 11th - shoulder shrug  symmetric 12th - tongue protrusion midline  MOTOR:  normal bulk and tone, full strength in the BUE, BLE  SENSORY:  normal and symmetric to light touch  COORDINATION:  finger-nose-finger, fine finger movements normal  GAIT/STATION:  normal   DIAGNOSTIC DATA (LABS, IMAGING, TESTING) - I reviewed patient records, labs, notes, testing and imaging myself where available.  Lab Results  Component Value Date   WBC 8.1 05/06/2024   HGB 9.7 (L) 05/06/2024   HCT 31.1 (L) 05/06/2024   MCV 91.2 05/06/2024   PLT 283 05/06/2024      Component Value Date/Time   NA 138 05/06/2024 1230   NA 143 03/01/2024 1502   K 3.0 (L) 05/06/2024 1230   CL 98 05/06/2024 1230   CO2 28 05/06/2024 1230   GLUCOSE 123 (H) 05/06/2024 1230   BUN 15 05/06/2024 1230   BUN 17 03/01/2024 1502   CREATININE 1.54 (H) 05/06/2024 1230   CREATININE 0.72 01/30/2015 1241   CALCIUM  8.1 (L) 05/06/2024 1230   PROT 7.0 05/06/2024 1230   PROT 7.2 11/03/2023 1617   ALBUMIN 3.1 (L) 05/06/2024 1230   ALBUMIN 4.0 11/03/2023 1617   AST 18 05/06/2024 1230   ALT 12 05/06/2024 1230   ALKPHOS 94 05/06/2024 1230   BILITOT 0.5 05/06/2024 1230   BILITOT <0.2 11/03/2023 1617   GFRNONAA 40 (L) 05/06/2024 1230   GFRNONAA >89 01/30/2015 1241   GFRAA 56 (L) 01/04/2021 1115   GFRAA >89 01/30/2015 1241   Lab Results  Component Value Date   CHOL 153 02/07/2024   HDL 37 (L) 02/07/2024   LDLCALC 91 02/07/2024   TRIG 123 02/07/2024   CHOLHDL 4.1 02/07/2024   Lab Results  Component Value Date   HGBA1C 7.3 (H) 02/03/2024   Lab Results  Component Value Date   VITAMINB12 166 (L) 02/03/2024   Lab Results  Component Value Date   TSH 1.220 02/10/2018    MRI Brain/MRA Head 05/06/2024 1. No acute intracranial abnormality. 2. Old subcortical infarcts of the right frontal and parietal lobes. 3. Normal MRA of the head and neck.     ASSESSMENT AND PLAN  53 y.o. year old female with multiple medical conditions including  hypertension, hyperlipidemia, obesity, sleep apnea, seizure disorder bipolar disorder who is presenting with memory loss described as forgetful, difficulty finding items and difficulty remember recent conversation.  She scored 20 out of 30 on MMSE indicative of impaired memory but she still independent in all her ADLs.  Patient does have a diagnosis of bipolar disorder, anxiety and depression which can affect her memory.  Her boyfriend tells me that she keeps to herself, does not like to interact with others.  Her daughter even invited her for a trip to [place] but she declined.  I have explained to patient that she has mild cognitive impairment. Her Vitamin B12 was low, advised her to start daily Vitamin B12 supplement. Plan for now is for patient to continue current medications, I did advise her to exercise at least 20 minutes a day 5 days a week, continue to eat healthy, work on losing weight, keep a good diet, good exercise plan and good sleep.  Will not prescribed any other medication at the moment.  Continue to follow with your PCP and return in 6 months for management of seizures.    1. Mild cognitive impairment   2. Seizure disorder (HCC)  3. OSA (obstructive sleep apnea)   4. Vitamin B 12 deficiency      Patient Instructions  Continue current medications Start B12 supplement, 1000 mcg daily Continue follow-up with your doctors Return in 6 months for seizure follow up    There are well-accepted and sensible ways to reduce risk for Alzheimers disease and other degenerative brain disorders .  Exercise Daily Walk A daily 20 minute walk should be part of your routine. Disease related apathy can be a significant roadblock to exercise and the only way to overcome this is to make it a daily routine and perhaps have a reward at the end (something your loved one loves to eat or drink perhaps) or a personal trainer coming to the home can also be very useful. Most importantly, the patient is  much more likely to exercise if the caregiver / spouse does it with him/her. In general a structured, repetitive schedule is best.  General Health: Any diseases which effect your body will effect your brain such as a pneumonia, urinary infection, blood clot, heart attack or stroke. Keep contact with your primary care doctor for regular follow ups.  Sleep. A good nights sleep is healthy for the brain. Seven hours is recommended. If you have insomnia or poor sleep habits we can give you some instructions. If you have sleep apnea wear your mask.  Diet: Eating a heart healthy diet is also a good idea; fish and poultry instead of red meat, nuts (mostly non-peanuts), vegetables, fruits, olive oil or canola oil (instead of butter), minimal salt (use other spices to flavor foods), whole grain rice, bread, cereal and pasta and wine in moderation.Research is now showing that the MIND diet, which is a combination of The Mediterranean diet and the DASH diet, is beneficial for cognitive processing and longevity. Information about this diet can be found in The MIND Diet, a book by Annitta Feeling, MS, RDN, and online at [employer].es  Finances, Power of [address] and Advance Directives: You should consider putting legal safeguards in place with regard to financial and medical decision making. While the spouse always has power of attorney for medical and financial issues in the absence of any form, you should consider what you want in case the spouse / caregiver is no longer around or capable of making decisions.   No orders of the defined types were placed in this encounter.   No orders of the defined types were placed in this encounter.   Return in about 6 months (around 12/01/2024).  I personally spent a total of 50 minutes in the care of the patient today including preparing to see the patient, getting/reviewing separately obtained history, performing a medically appropriate  exam/evaluation, counseling and educating, documenting clinical information in the EHR, independently interpreting results, and communicating results.   Pastor Falling, MD 06/01/2024, 4:58 PM  [facility] [address] [place], [place] [address] [phone]

## 2024-06-01 NOTE — Telephone Encounter (Signed)
 Patient called to verify appointment, Patient stated ride is running late, but on the way. Informed patient if late may have to reschedule.

## 2024-06-01 NOTE — Patient Instructions (Signed)
 Continue current medications Start B12 supplement, 1000 mcg daily Continue follow-up with your doctors Return in 6 months for seizure follow up    There are well-accepted and sensible ways to reduce risk for Alzheimers disease and other degenerative brain disorders .  Exercise Daily Walk A daily 20 minute walk should be part of your routine. Disease related apathy can be a significant roadblock to exercise and the only way to overcome this is to make it a daily routine and perhaps have a reward at the end (something your loved one loves to eat or drink perhaps) or a personal trainer coming to the home can also be very useful. Most importantly, the patient is much more likely to exercise if the caregiver / spouse does it with him/her. In general a structured, repetitive schedule is best.  General Health: Any diseases which effect your body will effect your brain such as a pneumonia, urinary infection, blood clot, heart attack or stroke. Keep contact with your primary care doctor for regular follow ups.  Sleep. A good nights sleep is healthy for the brain. Seven hours is recommended. If you have insomnia or poor sleep habits we can give you some instructions. If you have sleep apnea wear your mask.  Diet: Eating a heart healthy diet is also a good idea; fish and poultry instead of red meat, nuts (mostly non-peanuts), vegetables, fruits, olive oil or canola oil (instead of butter), minimal salt (use other spices to flavor foods), whole grain rice, bread, cereal and pasta and wine in moderation.Research is now showing that the MIND diet, which is a combination of The Mediterranean diet and the DASH diet, is beneficial for cognitive processing and longevity. Information about this diet can be found in The MIND Diet, a book by Annitta Feeling, MS, RDN, and online at [employer]  Finances, Power of [address] and Advance Directives: You should consider putting legal safeguards  in place with regard to financial and medical decision making. While the spouse always has power of attorney for medical and financial issues in the absence of any form, you should consider what you want in case the spouse / caregiver is no longer around or capable of making decisions.

## 2024-06-03 ENCOUNTER — Ambulatory Visit: Payer: Medicare HMO | Admitting: Neurology

## 2024-06-04 ENCOUNTER — Telehealth (HOSPITAL_COMMUNITY): Payer: Self-pay | Admitting: Cardiology

## 2024-06-04 NOTE — Telephone Encounter (Signed)
 Called to confirm/remind patient of their appointment [facility] on 06/04/2024.   Appointment:   [] Confirmed  [x] Left mess   [] No answer/No voice mail  [] VM Full/unable to leave message  [] Phone not in service  Patient reminded to bring all medications and/or complete list.  Confirmed patient has transportation. Gave directions, instructed to utilize valet parking.

## 2024-06-06 ENCOUNTER — Telehealth (HOSPITAL_COMMUNITY): Payer: Self-pay | Admitting: Surgery

## 2024-06-06 NOTE — Telephone Encounter (Signed)
 I attempted to call patient to cancel her appt in the [facility] in AM.  I left a message and requested that she call back to reschedule appt at a later time.

## 2024-06-07 ENCOUNTER — Encounter (HOSPITAL_COMMUNITY): Admitting: Cardiology

## 2024-06-07 ENCOUNTER — Other Ambulatory Visit (HOSPITAL_COMMUNITY): Payer: Self-pay | Admitting: Psychiatry

## 2024-06-07 ENCOUNTER — Encounter: Payer: Self-pay | Admitting: Registered Nurse

## 2024-06-07 ENCOUNTER — Other Ambulatory Visit

## 2024-06-07 DIAGNOSIS — N6310 Unspecified lump in the right breast, unspecified quadrant: Secondary | ICD-10-CM

## 2024-06-07 NOTE — Telephone Encounter (Signed)
 NPSG [employer] shara: [id number] (exp. 04/26/24 to 08/08/24) rs from 02/17/24 & 06/08/24   Mailed new packet to the patient.   She is r/s for 06/21/24 at 9 pm.

## 2024-06-08 ENCOUNTER — Encounter

## 2024-06-16 ENCOUNTER — Other Ambulatory Visit: Payer: Self-pay

## 2024-06-16 ENCOUNTER — Encounter (HOSPITAL_COMMUNITY): Payer: Self-pay | Admitting: Psychiatry

## 2024-06-16 ENCOUNTER — Ambulatory Visit (HOSPITAL_BASED_OUTPATIENT_CLINIC_OR_DEPARTMENT_OTHER): Admitting: Psychiatry

## 2024-06-16 VITALS — BP 117/63 | HR 83 | Ht 61.0 in | Wt 285.0 lb

## 2024-06-16 DIAGNOSIS — F3189 Other bipolar disorder: Secondary | ICD-10-CM | POA: Diagnosis not present

## 2024-06-16 MED ORDER — ARIPIPRAZOLE 10 MG PO TABS: 5.0000 mg | ORAL_TABLET | Freq: Every day | ORAL | 2 refills | Status: DC

## 2024-06-16 NOTE — Progress Notes (Signed)
 Psychiatric Initial Adult Assessment   Patient Identification: Brandi Gomez MRN:  [id number] Date of Evaluation:  06/16/2024 Referral Source: Dr. Elizbeth Chief Complaint:  Suspiciousness/paranoia Visit Diagnosis: Mood disorder secondary to CVA    Today the patient is seen in the office alone.  Elsie her boyfriend did not come in.  The patient's mood seems to be stable.  Her complaints are that she does not seem to be thinking quickly.  She has recently seen a neurologist and had a Mini-Mental status exam of 20 out of 30.  They identified a B12 deficiency and the patient is just starting a supplement for this.  The expectation is hopefully with B12 supplements the patient's cognition will improve.  The patient has no evidence of psychosis at this time.  She is on a significant dose of Abilify  as in the past when we reduced it she demonstrated agitated and psychotic behavior.  The possibility of reducing her Abilify  will be considered at her next visit when we have our boyfriend here.  Generally she is sleeping and eating well.  She is on a wait losing agent and is actually lost 10 pounds.  The patient still makes jewelry.  She stays active. (Hypo) Manic Symptoms:   Anxiety Symptoms:   Psychotic Symptoms:   PTSD Symptoms:   Past Psychiatric History: Cymbalta  30 mg Previous Psychotropic Medications: Cymbalta  30 mg  Substance Abuse History in the last 12 months:  No.  Consequences of Substance Abuse:   Past Medical History:  Past Medical History:  Diagnosis Date   Anemia    Anxiety    Bipolar 1 disorder (HCC)    Common migraine 05/19/2015   Depression    Diabetes mellitus, type II (HCC)    Hypertension    Irritable bowel syndrome (IBS)    Mild mental retardation    Obesity    Partial complex seizure disorder with intractable epilepsy (HCC) 05/12/2014   Seizures (HCC)    intractable, sz 08/23/17   Sleep apnea    Stroke (HCC)    Type II or unspecified type diabetes mellitus  without mention of complication, not stated as uncontrolled     Past Surgical History:  Procedure Laterality Date   BUBBLE STUDY  11/15/2019   Procedure: BUBBLE STUDY;  Surgeon: Maranda Leim DEL, MD;  Location: [facility] ENDOSCOPY;  Service: Cardiovascular;;   COLONOSCOPY     2012-normal , Dr Jakie   ESOPHAGOGASTRODUODENOSCOPY     normal-Dr Jakie 2012   LOOP RECORDER INSERTION N/A 05/30/2017   Procedure: Loop Recorder Insertion;  Surgeon: Inocencio Soyla Lunger, MD;  Location: [facility] INVASIVE CV LAB;  Service: Cardiovascular;  Laterality: N/A;   LOOP RECORDER REMOVAL N/A 03/04/2018   Procedure: LOOP RECORDER REMOVAL;  Surgeon: Inocencio Soyla Lunger, MD;  Location: [facility] INVASIVE CV LAB;  Service: Cardiovascular;  Laterality: N/A;   MYRINGOTOMY WITH TUBE PLACEMENT     MYRINGOTOMY WITH TUBE PLACEMENT Right 11/05/2017   Procedure: MYRINGOTOMY WITH TUBE PLACEMENT;  Surgeon: Roark Rush, MD;  Location[facility] OR;  Service: ENT;  Laterality: Right;  right T Tube placement   NASAL SINUS SURGERY     RIGHT HEART CATH AND CORONARY ANGIOGRAPHY N/A 03/08/2024   Procedure: RIGHT HEART CATH AND CORONARY ANGIOGRAPHY;  Surgeon: Zenaida Morene PARAS, MD;  Location: [facility] INVASIVE CV LAB;  Service: Cardiovascular;  Laterality: N/A;   TEE WITHOUT CARDIOVERSION N/A 05/30/2017   Procedure: TRANSESOPHAGEAL ECHOCARDIOGRAM (TEE);  Surgeon: Alveta Aleene PARAS, MD;  Location: [facility] ENDOSCOPY;  Service: Cardiovascular;  Laterality: N/A;   TEE WITHOUT CARDIOVERSION N/A 11/15/2019   Procedure: TRANSESOPHAGEAL ECHOCARDIOGRAM (TEE);  Surgeon: Maranda Leim DEL, MD;  Location: [facility] ENDOSCOPY;  Service: Cardiovascular;  Laterality: N/A;    Family Psychiatric History:   Family History:  Family History  Problem Relation Age of Onset   Diabetes Mother        passed away from accidental death   Hypertension Mother    Bipolar disorder Father    Diabetes Daughter    Leukemia Daughter    Ovarian cancer Maternal Aunt     Social History:    Social History   Socioeconomic History   Marital status: Single    Spouse name: Elsie   Number of children: 3   Years of education: 12   Highest education level: Not on file  Occupational History   Occupation: disabled    Employer: DISABLED  Tobacco Use   Smoking status: Never   Smokeless tobacco: Never  Vaping Use   Vaping status: Never Used  Substance and Sexual Activity   Alcohol use: No   Drug use: No   Sexual activity: Yes  Other Topics Concern   Not on file  Social History Narrative   Patient lives at home with daughter.    Patient has 3 children.    Patient is right handed.    Patient has a high school education.    Patient is on disability   Patient drinks 2 cups of caffeine daily.   Social Drivers of Corporate investment banker Strain: Low Risk  (02/04/2024)   Overall Financial Resource Strain (CARDIA)    Difficulty of Paying Living Expenses: Not very hard  Food Insecurity: No Food Insecurity (02/03/2024)   Hunger Vital Sign    Worried About Running Out of Food in the Last Year: Never true    Ran Out of Food in the Last Year: Never true  Transportation Needs: No Transportation Needs (02/03/2024)   PRAPARE - Administrator, Civil Service (Medical): No    Lack of Transportation (Non-Medical): No  Physical Activity: Not on file  Stress: Not on file  Social Connections: Not on file    Additional Social History:   Allergies:   Allergies  Allergen Reactions   Amoxicillin  Itching    Did it involve swelling of the face/tongue/throat, SOB, or low BP? No Did it involve sudden or severe rash/hives, skin peeling, or any reaction on the inside of your mouth or nose? No Did you need to seek medical attention at a hospital or doctor's office? No When did it last happen?      within the past 10 years If all above answers are NO, may proceed with cephalosporin use.    Lisinopril  Swelling    Angioedema    Hydrocodone  Other (See Comments)     Depressed    Tegretol  [Carbamazepine ] Swelling    Throat swells    Metabolic Disorder Labs: Lab Results  Component Value Date   HGBA1C 7.3 (H) 02/03/2024   MPG 163 02/03/2024   MPG 180.03 10/31/2020   No results found for: PROLACTIN Lab Results  Component Value Date   CHOL 153 02/07/2024   TRIG 123 02/07/2024   HDL 37 (L) 02/07/2024   CHOLHDL 4.1 02/07/2024   VLDL 25 02/07/2024   LDLCALC 91 02/07/2024   LDLCALC 44 04/18/2020     Current Medications: Current Outpatient Medications  Medication Sig Dispense Refill   ACCU-CHEK GUIDE TEST test strip daily.  acetaminophen  (TYLENOL ) 325 MG tablet Take 2 tablets (650 mg total) by mouth every 4 (four) hours as needed for mild pain (or temp > 37.5 C (99.5 F)).     atorvastatin  (LIPITOR) 40 MG tablet TAKE 1 TABLET (40 MG TOTAL) BY MOUTH DAILY AT 6 PM FOR CHOLESTEROL 90 tablet 0   carvedilol  (COREG ) 6.25 MG tablet Take 6.25 mg by mouth 2 (two) times daily.     chlorhexidine (PERIDEX) 0.12 % solution Use as directed 15 mLs in the mouth or throat daily.     cloBAZam  (ONFI ) 10 MG tablet TAKE 1 TABLET BY MOUTH EVERY DAY AT BEDTIME 30 tablet 5   cyanocobalamin  (VITAMIN B12) 1000 MCG tablet Take 1 tablet (1,000 mcg total) by mouth daily. 90 tablet 0   ferrous sulfate  325 (65 FE) MG tablet Take 325 mg by mouth daily with breakfast.     glipiZIDE  (GLUCOTROL ) 10 MG tablet Take 10 mg by mouth 2 (two) times daily.     hydrOXYzine  (VISTARIL ) 25 MG capsule 1 QHS  1 qday PRN 60 capsule 5   isosorbide -hydrALAZINE  (BIDIL ) 20-37.5 MG tablet Take 0.5 tablets by mouth 3 (three) times daily. 45 tablet 0   lamoTRIgine  (LAMICTAL ) 200 MG tablet Take 1.5 tablets (300 mg total) by mouth 2 (two) times daily. 270 tablet 1   losartan  (COZAAR ) 50 MG tablet Take 1 tablet (50 mg total) by mouth daily. 90 tablet 3   Microlet Lancets MISC daily.     MOUNJARO 5 MG/0.5ML Pen SMARTSIG:0.5 Milliliter(s) SUB-Q Once a Week     potassium chloride  SA (KLOR-CON  M) 20 MEQ  tablet Take 1 tablet (20 mEq total) by mouth 2 (two) times daily. 6 tablet 0   sitaGLIPtin  (JANUVIA ) 100 MG tablet Take 100 mg by mouth daily.     spironolactone  (ALDACTONE ) 25 MG tablet Take 0.5 tablets (12.5 mg total) by mouth daily. 15 tablet 0   SYMBICORT  160-4.5 MCG/ACT inhaler SMARTSIG:2 Puff(s) By Mouth Morning-Evening     torsemide  40 MG TABS Take 40 mg by mouth 2 (two) times daily. 60 tablet 11   Vitamin D , Ergocalciferol , (DRISDOL ) 1.25 MG (50000 UNIT) CAPS capsule Take 1 capsule (50,000 Units total) by mouth every 7 (seven) days. 12 capsule 0   ARIPiprazole  (ABILIFY ) 10 MG tablet Take 0.5 tablets (5 mg total) by mouth daily. 30 tablet 2   No current facility-administered medications for this visit.    Neurologic: Headache: No Seizure: Yes Paresthesias:No  Musculoskeletal: Strength & Muscle Tone: within normal limits Gait & Station: normal Patient leans: Backward and N/A  Psychiatric Specialty Exam: ROS  Blood pressure 117/63, pulse 83, height 5' 1 (1.549 m), weight 285 lb (129.3 kg).Body mass index is 53.85 kg/m.  General Appearance: Casual  Eye Contact:  Fair  Speech:  Slurred  Volume:  Normal  Mood:  Dysphoric  Affect:  Appropriate  Thought Process:  Goal Directed  Orientation:  Full (Time, Place, and Person)  Thought Content:  Logical  Suicidal Thoughts:  No  Homicidal Thoughts:  No  Memory:  Negative  Judgement:  Fair  Insight:  Lacking  Psychomotor Activity:  Decreased  Concentration:    Recall:  Fair  Fund of Knowledge:Fair  Language: Fair  Akathisia:  No  Handed:    AIMS (if indicated):    Assets:  Desire for Improvement  ADL's:  Intact  Cognition: WNL  Sleep:      Treatment Plan Summary:    This patient's diagnosis is most likely that of  a brief psychotic episode.  I am not clear that she needs to be on Abilify .  He shows no evidence of tardive dyskinesia.  On her next visit we will attempt to slowly titrated off of Abilify .  Meanwhile the  use of B12 supplements hopefully will improve her cognition.  She will return to see me in 3 months.  She is actually functioning fairly well.

## 2024-06-21 ENCOUNTER — Ambulatory Visit (INDEPENDENT_AMBULATORY_CARE_PROVIDER_SITE_OTHER): Admitting: Neurology

## 2024-06-21 DIAGNOSIS — Z8669 Personal history of other diseases of the nervous system and sense organs: Secondary | ICD-10-CM

## 2024-06-21 DIAGNOSIS — G4733 Obstructive sleep apnea (adult) (pediatric): Secondary | ICD-10-CM | POA: Diagnosis not present

## 2024-06-21 DIAGNOSIS — G4734 Idiopathic sleep related nonobstructive alveolar hypoventilation: Secondary | ICD-10-CM

## 2024-06-21 DIAGNOSIS — G4720 Circadian rhythm sleep disorder, unspecified type: Secondary | ICD-10-CM

## 2024-06-21 DIAGNOSIS — G40909 Epilepsy, unspecified, not intractable, without status epilepticus: Secondary | ICD-10-CM

## 2024-06-22 ENCOUNTER — Telehealth: Payer: Self-pay | Admitting: Neurology

## 2024-06-22 NOTE — Telephone Encounter (Signed)
 Pt asked to be connected to [facility] in sleep lab re: her sleep study last night

## 2024-06-24 NOTE — Procedures (Signed)
 Physician Interpretation:     [facility] POLYSOMNOGRAPHY  INTERPRETATION REPORT   STUDY DATE:  06/21/2024     PATIENT NAME:  Brandi Gomez         DATE OF BIRTH:  10/13/1971  PATIENT ID:  [id number]    TYPE OF STUDY:  PSG  READING PHYSICIAN: TRUE MAR, MD, PhD   SCORING TECHNICIAN: Donnice Counts, RPSGT     Referred by: Lauraine Born, NP ? History and Indication for Testing: 53 year old right-handed woman with an underlying medical history of hypertension, diabetes, depression, morbid obesity, seizures, OSA, and anxiety who presents for re-evaluation of her OSA. She has been compliant with her autoPAP of 7-12 cm with EPR of 3. She should be eligible for a new machine.  Height: 61 in Weight: 289 lb (BMI 54) Neck Size: 0     MEDICATIONS: Tylenol , Abilify , Lipitor, Coreg , Onfi , Vitamin B12, Iron, Glucotrol , Vistaril , Bidil ,Lamictal , Cozaar , Klor-Con , Januvia , Aldactone , Torsemide , Vitamin D    TECHNICAL DESCRIPTION: A registered sleep technologist  was in attendance for the duration of the recording.  Data collection, scoring, video monitoring, and reporting were performed in compliance with the AASM Manual for the Scoring of Sleep and Associated Events; (Hypopnea is scored based on the criteria listed in Section VIII D. 1b in the AASM Manual V2.6 using a 4% oxygen  desaturation rule or Hypopnea is scored based on the criteria listed in Section VIII D. 1a in the AASM Manual V2.6 using 3% oxygen  desaturation and /or arousal rule).   SLEEP CONTINUITY AND SLEEP ARCHITECTURE:  Lights-out was at 21:44: and lights-on at  04:55:. Of note the patient slid onto the floor on socks and sustained no obvious injuries and required 2 person assist to get of the floor at the end of the study.  Video surveillance of epoch 38, 04:37 shows the patient removing her cover and sliding of the side of the bed, landing as a forced sit down sideways, leaning her left side against the  bed, and the technologist is immediately present and assisting. Total recording time was 7 hours, 10.5 min. Total sleep time ( TST) was 376.0 minutes with a normal sleep efficiency at 87.3%. There was  0.0% REM sleep.   BODY POSITION:  TST was divided  between the following sleep positions: 13.8% supine;  86.2% lateral;  0% prone. Duration of total sleep and percent of total sleep in their respective position is as follows: supine 52 minutes (14%), non-supine 324 minutes (86%); right 85 minutes (23%), left 239 minutes (64%), and prone 00 minutes (0%).  Total supine REM sleep time was 00 minutes (0% of total REM sleep).  Sleep latency was normal at 8.5 minutes.  REM sleep latency was decreased at 0.0 minutes. Of the total sleep time, the percentage of stage N1 sleep was 3.2%, stage N2 sleep was 97%, stage N3 sleep was 0.0%, and REM sleep was 0.0%.  There were 0 Stage R periods observed on this study night, 11 awakenings (i.e. transitions to Stage W from any sleep stage), and 34 total stage transitions. Wake after sleep onset (WASO) time accounted for .   RESPIRATORY MONITORING:   Based on CMS criteria (using a 4% oxygen  desaturation rule for scoring hypopneas), there were 22 apneas (20 obstructive; 0 central; 2 mixed), and 0 hypopneas.  Apnea index was 3.5. Hypopnea index was 0.0. The apnea-hypopnea index was 3.5 overall (3.5 supine, 0 non-supine; 0.0 REM, 0.0 supine REM).  There were 0 respiratory effort-related arousals (  RERAs).  The RERA index was 0 events/h. Total respiratory disturbance index (RDI) was 3.5 events/h. RDI results showed: supine RDI  3.5 /h; non-supine RDI 3.5 /h; REM RDI 0.0 /h, supine REM RDI 0.0 /h.   Based on AASM criteria (using a 3% oxygen  desaturation and /or arousal rule for scoring hypopneas), there were 22 apneas (20 obstructive; 0 central; 2 mixed), and 5 hypopneas. Apnea index was 3.5. Hypopnea index was 0.8. The apnea-hypopnea index was 4.3 overall (5.8 supine, 0  non-supine; 0.0 REM, 0.0 supine REM).  There were 0 respiratory effort-related arousals (RERAs).  The RERA index was 0 events/h. Total respiratory disturbance index (RDI) was 4.3 events/h. RDI results showed: supine RDI  5.8 /h; non-supine RDI 4.1 /h; REM RDI 0.0 /h, supine REM RDI 0.0 /h.   OXIMETRY: Oxyhemoglobin Saturation Nadir during sleep was at  82%) from a mean of 95%.  Of the Total sleep time (TST)   hypoxemia (=<88%) was present for  22.7 minutes, or 6.0% of total sleep time. She was placed on supplemental oxygen  at 1 lpm as her O2 saturations were consistently low. O2 was added at epoch 91, 00:45.   LIMB MOVEMENTS: There were 0 periodic limb movements of sleep (0.0/hr), of which 0 (0.0/hr) were associated with an arousal.   AROUSAL: There were 27 arousals in total, for an arousal index of 4 arousals/hour.  Of these, 15 were identified as respiratory-related arousals (2 /h), 0 were PLM-related arousals (0 /h), and 16 were non-specific arousals (3 /h).    EEG: Review of the EEG showed no abnormal electrical discharges and symmetrical bihemispheric findings.      EKG: The EKG revealed normal sinus rhythm (NSR). Max heart rate was 77 bpm.    AUDIO/VIDEO REVIEW: The audio and video review did not show any abnormal or unusual behaviors, movements, phonations or vocalizations. See above description of the patient sliding off the bed while trying to get up and sitting on the ground. The patient took 1 restroom break. Snoring was noted, in the mild to moderate range.  POST-STUDY QUESTIONNAIRE: Post study, the patient indicated, that sleep was the same as usual.   IMPRESSION:   1. History of Obstructive Sleep Apnea (OSA) 2. Nocturnal hypoxemia 3. Dysfunctions associated with sleep stages or arousal from sleep   RECOMMENDATIONS:   1. This study is limited due to absence of REM sleep and little supine sleep, likely rendering an underestimation of her sleep apnea. There is evidence of  nocturnal hypoxemia and the patient was place on supplemental O2 at 1 lpm. If a new autoPAP is needed, I recommend basing the order for new equipment on the previous sleep study which did demonstrate OSA. If a new study is required by insurance, I recommend repeating testing in the form of home sleep testing. Weight loss is recommended.  2. Please note, that untreated obstructive sleep apnea may carry additional perioperative morbidity. Patients with significant obstructive sleep apnea should receive perioperative PAP therapy and the surgeons and particularly the anesthesiologist should be informed of the diagnosis and the severity of the sleep disordered breathing. 3. No seizure event was noted. The patient attempted to get out of bed on her own and slid off to the side of the bed with no obvious injuries reported or seen during the visible part to the video and audio surveillance and the technologist was at the bedside to assist very swiftly. She sat on the floor and did not hit the head to the ground  or on the side of the bed or the bedside stand or lose consciousness. The study was ended a little earlier due to that.  4. This study shows some sleep fragmentation and abnormal sleep stage percentages; these are nonspecific findings and per se do not signify an intrinsic sleep disorder or a cause for the patient's sleep-related symptoms. Causes include (but are not limited to) the first night effect of the sleep study, circadian rhythm disturbances, medication effect or an underlying mood disorder or medical problem.  5. The patient should be cautioned not to drive, work at heights, or operate dangerous or heavy equipment when tired or sleepy. Review and reiteration of good sleep hygiene measures should be pursued with any patient. 6. The patient will be seen in follow-up in the Sleep Clinic at [facility] for discussion of the test results, progress with treatment, and recheck on symptoms as well as potential further  management strategies. The referring provider and the patient will be notified of the test results.  I certify that I have reviewed the entire raw data recording prior to the issuance of this report in accordance with the Standards of Accreditation of the American Academy of Sleep Medicine (AASM).  True Mar, MD, PhD Medical Director, [facility]) Diplomat, ABPN (Neurology and Sleep)               Technical Report:   General Information  Name: Brandi Gomez, Brandi Gomez  BMI: 54.61 Physician: TRUE MAR, MD  ID: [id number] Height: 61.0 in Technician: Donnice Counts, RPSGT  Sex: Female Weight: 289.0 lb Record: [id number]  Age: 49 [[birth year]] Date: 06/21/2024    Medical & Medication History    This is a 53 year old woman past medical history of hypertension, hyperlipidemia, obesity, sleep apnea, seizure disorder bipolar disorder who is presenting with memory loss. Patient described her memory loss as difficulty remembering, problem with short-term memory. This has been going on for the past year and getting worse. She tells me that she forgets really very fast what she was told. Sometime when using her phone to text you my text around the information. Sometimes, she even forgets her birthday. She is accompanied by her boyfriend who also mentions the patient is very forgetful, she does not have much activity, stays home most of the day. Needs new machine per DME due to overheating issue. We will continue current settings. 7-15 cm water , EPR 3 Mask of choice.  Tylenol , Abilify , Lipitor, Coreg , Onfi , Vitamin B12, Iron, Glucotrol , Vistaril , Bidil ,Lamictal , Cozaar , Klor-Con , Januvia , Aldactone , Torsemide , Vitamin D    Sleep Disorder      Comments   Patient arrived for a diagnostic polysomnogram. Patient has been wearing an APAP 7-15cm (EPR 3) with a full face mask for years, and is in need of new equipment. Procedure explained and all questions  answered. Oxygen  was added at 1/LPM at 22:25 for low SAO2 per lab protocol. Mild to moderate intermittent snoring. Few respiratory events, after two hours total sleep time, AHI = 4.5. No obvious cardiac arrhythmias noted. Patient slept supine, left, and right. No significant PLMS observed. Patient had no restroom visit. Study ended a little early as patient attempted to get up on her own, with socks on her feet, she slid to the floor and had to be assisted up off the floor by two technicians.    Lights out: 09:44:27 PM Lights on: 04:55:04 AM   Time Total Supine Side Prone Upright  Recording (TRT) 7h 10.35m 1h 9.80m 6h 1.63m 0h  0.60m 0h 0.6m  Sleep (TST) 6h 16.79m 0h 52.78m 5h 24.96m 0h 0.72m 0h 0.58m   Latency N1 N2 N3 REM Onset Per. Slp. Eff.  Actual 0h 0.36m 0h 0.59m 0h 0.59m 0h 0.21m 0h 8.45m 0h 8.91m 87.46%   Stg Dur Wake N1 N2 N3 REM  Total 54.0 12.0 364.0 0.0 0.0  Supine 17.5 2.0 50.0 0.0 0.0  Side 36.5 10.0 314.0 0.0 0.0  Prone 0.0 0.0 0.0 0.0 0.0  Upright 0.0 0.0 0.0 0.0 0.0   Stg % Wake N1 N2 N3 REM  Total 12.5 3.2 96.7 0.0 0.0  Supine 4.1 0.5 13.3 0.0 0.0  Side 8.5 2.7 83.4 0.0 0.0  Prone 0.0 0.0 0.0 0.0 0.0  Upright 0.0 0.0 0.0 0.0 0.0     Apnea Summary Sub Supine Side Prone Upright  Total 22 Total 22 3 19  0 0    REM 0 0 0 0 0    NREM 22 3 19  0 0  Obs 20 REM 0 0 0 0 0    NREM 20 3 17  0 0  Mix 2 REM 0 0 0 0 0    NREM 2 0 2 0 0  Cen 0 REM 0 0 0 0 0    NREM 0 0 0 0 0   Rera Summary Sub Supine Side Prone Upright  Total 0 Total 0 0 0 0 0    REM 0 0 0 0 0    NREM 0 0 0 0 0   Hypopnea Summary Sub Supine Side Prone Upright  Total 5 Total 5 2 3  0 0    REM 0 0 0 0 0    NREM 5 2 3  0 0   4% Hypopnea Summary Sub Supine Side Prone Upright  Total (4%) 0 Total 0 0 0 0 0    REM 0 0 0 0 0    NREM 0 0 0 0 0     AHI Total Obs Mix Cen  4.30 Apnea 3.51 3.19 0.32 0.00   Hypopnea 0.80 -- -- --  3.51 Hypopnea (4%) 0.00 -- -- --    Total Supine Side Prone Upright  Position AHI 4.30 5.77  4.07 0.00 0.00  REM AHI 0.00   NREM AHI 4.31   Position RDI 4.30 5.77 4.07 0.00 0.00  REM RDI 0.00   NREM RDI 4.31    4% Hypopnea Total Supine Side Prone Upright  Position AHI (4%) 3.51 3.46 3.51 0.00 0.00  REM AHI (4%) 0.00   NREM AHI (4%) 3.51   Position RDI (4%) 3.51 3.46 3.51 0.00 0.00  REM RDI (4%) 0.00   NREM RDI (4%) 3.51    Desaturation Information Threshold: 2% <100% <90% <80% <70% <60% <50% <40%  Supine 5.0 0.0 0.0 0.0 0.0 0.0 0.0  Side 31.0 2.0 0.0 0.0 0.0 0.0 0.0  Prone 0.0 0.0 0.0 0.0 0.0 0.0 0.0  Upright 0.0 0.0 0.0 0.0 0.0 0.0 0.0  Total 36.0 2.0 0.0 0.0 0.0 0.0 0.0  Index 5.4 0.3 0.0 0.0 0.0 0.0 0.0   Threshold: 3% <100% <90% <80% <70% <60% <50% <40%  Supine 3.0 0.0 0.0 0.0 0.0 0.0 0.0  Side 10.0 0.0 0.0 0.0 0.0 0.0 0.0  Prone 0.0 0.0 0.0 0.0 0.0 0.0 0.0  Upright 0.0 0.0 0.0 0.0 0.0 0.0 0.0  Total 13.0 0.0 0.0 0.0 0.0 0.0 0.0  Index 1.9 0.0 0.0 0.0 0.0 0.0 0.0   Threshold: 4% <100% <90% <80% <70% <60% <  50% <40%  Supine 1.0 0.0 0.0 0.0 0.0 0.0 0.0  Side 6.0 0.0 0.0 0.0 0.0 0.0 0.0  Prone 0.0 0.0 0.0 0.0 0.0 0.0 0.0  Upright 0.0 0.0 0.0 0.0 0.0 0.0 0.0  Total 7.0 0.0 0.0 0.0 0.0 0.0 0.0  Index 1.0 0.0 0.0 0.0 0.0 0.0 0.0   Threshold: 3% <100% <90% <80% <70% <60% <50% <40%  Supine 3 0 0 0 0 0 0  Side 10 0 0 0 0 0 0  Prone 0 0 0 0 0 0 0  Upright 0 0 0 0 0 0 0  Total 13 0 0 0 0 0 0   Awakening/Arousal Information # of Awakenings 11  Wake after sleep onset 45.69m  Wake after persistent sleep 45.22m   Arousal Assoc. Arousals Index  Apneas 11 1.8  Hypopneas 4 0.6  Leg Movements 2 0.3  Snore 0 0.0  PTT Arousals 0 0.0  Spontaneous 18 2.9  Total 35 5.6  Leg Movement Information PLMS LMs Index  Total LMs during PLMS 0 0.0  LMs w/ Microarousals 0 0.0   LM LMs Index  w/ Microarousal 2 0.3  w/ Awakening 0 0.0  w/ Resp Event 0 0.0  Spontaneous 1 0.2  Total 3 0.5     Desaturation threshold setting: 3% Minimum desaturation setting: 10 seconds SaO2  nadir: 82% The longest event was a 45 sec obstructive Apnea with a minimum SaO2 of 92%. The lowest SaO2 was 91% associated with a 15 sec obstructive Apnea. EKG Rates EKG Avg Max Min  Awake 72 81 61  Asleep 64 77 55  EKG Events: N/A

## 2024-06-28 ENCOUNTER — Telehealth: Payer: Self-pay | Admitting: Neurology

## 2024-06-28 ENCOUNTER — Encounter (HOSPITAL_COMMUNITY): Admitting: Cardiology

## 2024-06-28 DIAGNOSIS — G4733 Obstructive sleep apnea (adult) (pediatric): Secondary | ICD-10-CM

## 2024-06-28 NOTE — Telephone Encounter (Signed)
 Please call the patient, I had ordered a new CPAP due to her CPAP reportedly overheating.  She had PSG 06/21/24 that was limited due to absence of REM sleep and little supine sleep likely underestimating her sleep apnea.   Can we check with her DME to see if a sleep study is necessary for her to get a new machine?  If not, we will order a new CPAP based on the results of her previous sleep study.  If new study is required, we will repeat as an HST.   Thanks

## 2024-06-29 NOTE — Telephone Encounter (Signed)
 According to DME, advised to submit an order for CPAP to determine if anything further is needed.  I submitted a new CPAP order with current settings. 7-15 cm water , EPR 3, auto ramp, 45 min ramp, start 5 cm. Mask of choice, res med.   Orders Placed This Encounter  Procedures   For home use only DME continuous positive airway pressure (CPAP)

## 2024-06-29 NOTE — Addendum Note (Signed)
 Addended by: GAYLAND LAURAINE PARAS on: 06/29/2024 01:46 PM   Modules accepted: Orders

## 2024-06-29 NOTE — Telephone Encounter (Signed)
 Cpap order plaed:

## 2024-06-29 NOTE — Telephone Encounter (Signed)
 Called and relayed sarah's information to pt. I then proceeded to send a community msg thru apic to DME [employer]  [phone]

## 2024-07-07 ENCOUNTER — Telehealth (HOSPITAL_COMMUNITY): Payer: Self-pay | Admitting: Cardiology

## 2024-07-07 NOTE — Telephone Encounter (Signed)
 Called to confirm/remind patient of their appointment at [facility] on 07/07/2024.   Appointment:   [] Confirmed  [x] Left mess   [] No answer/No voice mail  [] VM Full/unable to leave message  [] Phone not in service  Patient reminded to bring all medications and/or complete list.  Confirmed patient has transportation. Gave directions, instructed to utilize valet parking.

## 2024-07-08 ENCOUNTER — Encounter (HOSPITAL_COMMUNITY): Admitting: Cardiology

## 2024-07-12 ENCOUNTER — Telehealth: Payer: Self-pay | Admitting: Neurology

## 2024-07-12 DIAGNOSIS — G4733 Obstructive sleep apnea (adult) (pediatric): Secondary | ICD-10-CM

## 2024-07-12 NOTE — Telephone Encounter (Signed)
 Got a note from her DME, needs new diagnostic sleep study with minimum AHI of 5.   She had PSG 06/21/24 that was limited due to absence of REM sleep and little supine sleep likely underestimating her sleep apnea. Dr. Buck recommended trying to order new CPAP with her current settings to qualify for a new machine if her insurance will allow, otherwise get HST.   Will reorder HST. Make sure she doesn't wear her CPAP when she does HST. Thanks  Orders Placed This Encounter  Procedures   Home sleep test

## 2024-07-12 NOTE — Telephone Encounter (Signed)
 Called and spoke to pt and relayed results/recommendations. Pt stated that she doesn't want home sleep study she wants to do one here in the office as she doesn't want people going to her home

## 2024-07-12 NOTE — Telephone Encounter (Signed)
 HST [employer] pending

## 2024-07-13 NOTE — Telephone Encounter (Signed)
 Orders for a Home Sleep Study were placed yesterday (07/12/24) and the sleep lab is currently working on the authorization. Insurance has up to 21 business days to respond to an authorization. Once the auth is obtained the patient will be contacted to schedule the sleep study.

## 2024-07-13 NOTE — Telephone Encounter (Signed)
 Patient called to check on when someone will be calling her to discuss the process for HST.

## 2024-07-19 NOTE — Telephone Encounter (Signed)
 HST- [employer] no auth req.

## 2024-07-21 NOTE — Telephone Encounter (Signed)
 Patient would like a call from the nurse to discuss the second sleep study. Insurance will not cover a second sleep study.

## 2024-07-21 NOTE — Telephone Encounter (Signed)
 Returned patient call and patient stated that insurance is not going to pay for another study. Due to the cost she does not wish to have another sleep study if insurance is not going to pay. Requesting a call back from the nurse to discuss next steps.

## 2024-07-22 NOTE — Telephone Encounter (Signed)
 Community msg sent to dme:

## 2024-07-22 NOTE — Telephone Encounter (Signed)
 Called and informed pt and they voiced understanding that we are trying to find a way to get her a cpap machine and will be in touch asap when we know more.

## 2024-08-02 ENCOUNTER — Encounter (HOSPITAL_COMMUNITY): Payer: Self-pay | Admitting: Cardiology

## 2024-08-02 ENCOUNTER — Ambulatory Visit (HOSPITAL_COMMUNITY)
Admission: RE | Admit: 2024-08-02 | Discharge: 2024-08-02 | Disposition: A | Discharge status: Home / Self Care | Source: Ambulatory Visit | Attending: Cardiology | Admitting: Cardiology

## 2024-08-02 VITALS — BP 115/71 | HR 80 | Ht 62.0 in | Wt 282.6 lb

## 2024-08-02 DIAGNOSIS — E1122 Type 2 diabetes mellitus with diabetic chronic kidney disease: Secondary | ICD-10-CM | POA: Diagnosis not present

## 2024-08-02 DIAGNOSIS — G4733 Obstructive sleep apnea (adult) (pediatric): Secondary | ICD-10-CM | POA: Insufficient documentation

## 2024-08-02 DIAGNOSIS — Z79899 Other long term (current) drug therapy: Secondary | ICD-10-CM | POA: Diagnosis not present

## 2024-08-02 DIAGNOSIS — I5020 Unspecified systolic (congestive) heart failure: Secondary | ICD-10-CM

## 2024-08-02 DIAGNOSIS — N1831 Chronic kidney disease, stage 3a: Secondary | ICD-10-CM | POA: Diagnosis not present

## 2024-08-02 DIAGNOSIS — I428 Other cardiomyopathies: Secondary | ICD-10-CM | POA: Diagnosis not present

## 2024-08-02 DIAGNOSIS — E8770 Fluid overload, unspecified: Secondary | ICD-10-CM | POA: Insufficient documentation

## 2024-08-02 DIAGNOSIS — E1165 Type 2 diabetes mellitus with hyperglycemia: Secondary | ICD-10-CM

## 2024-08-02 DIAGNOSIS — Z7985 Long-term (current) use of injectable non-insulin antidiabetic drugs: Secondary | ICD-10-CM | POA: Diagnosis not present

## 2024-08-02 DIAGNOSIS — Z6841 Body Mass Index (BMI) 40.0 and over, adult: Secondary | ICD-10-CM | POA: Insufficient documentation

## 2024-08-02 DIAGNOSIS — Z8673 Personal history of transient ischemic attack (TIA), and cerebral infarction without residual deficits: Secondary | ICD-10-CM | POA: Diagnosis not present

## 2024-08-02 DIAGNOSIS — I5022 Chronic systolic (congestive) heart failure: Secondary | ICD-10-CM | POA: Diagnosis not present

## 2024-08-02 MED ORDER — LOSARTAN POTASSIUM 100 MG PO TABS: 100.0000 mg | ORAL_TABLET | Freq: Every day | ORAL | 3 refills | Status: DC

## 2024-08-02 MED ORDER — POTASSIUM CHLORIDE CRYS ER 20 MEQ PO TBCR: 20.0000 meq | EXTENDED_RELEASE_TABLET | Freq: Every day | ORAL | 3 refills | Status: DC

## 2024-08-02 MED ORDER — SPIRONOLACTONE 25 MG PO TABS: 25.0000 mg | ORAL_TABLET | Freq: Every day | ORAL | 3 refills | Status: AC

## 2024-08-02 NOTE — Progress Notes (Signed)
   [facility] FOLLOW UP CLINIC NOTE  Referring Physician: Leontine Cramp, NP  Primary Care: Leontine Cramp, NP Primary Cardiologist:  HPI: Brandi Gomez is a 53 y.o. female who presents for follow up of chronic systolic heart failure.      HF dates back to 2018. EF had been in range of 35-40%. Had not seen Cardiology since 2020.   Presented with acute respiratory failure 2/2 acute on chronic HFrEF on 02/02/24. Echo with EF 30-35%, grade II DD, RV okay. Cardiology consulted. She was diuresed with IV lasix . GDMT titrated. Had mild troponin elevation with flat trend. Ischemic workup deferred to outpatient setting.   Underwent coronary arteriography that was normal, moderately elevated filling pressures.      SUBJECTIVE:  Overall doing fairly well, she reports some occasional intermittent chest pain.  She describes the chest pain as dull, does not reliably come on with exertion but comes on randomly, and last for short amount of time before improving.  Otherwise, she has shortness of breath with more than mild exertion.  Has been taking her medications as prescribed without any issues.  She has trouble with her memory so her medication packets are made for her.  Denies any lightheadedness or dizziness.  PMH, current medications, allergies, social history, and family history reviewed in epic.  PHYSICAL EXAM: Vitals:   08/02/24 1322  BP: 115/71  Pulse: 80  SpO2: 93%   GENERAL: Well nourished and in no apparent distress at rest.  PULM:  Normal work of breathing, clear to auscultation bilaterally. Respirations are unlabored.  CARDIAC:  JVP: ***         Normal rate with regular rhythm. No murmurs, rubs or gallops.  *** edema. Warm and well perfused extremities. ABDOMEN: Soft, non-tender, non-distended. NEUROLOGIC: Patient is oriented x3 with no focal or lateralizing neurologic deficits.    DATA REVIEW  ECG: ***    ECHO: ***   CATH: ***    ASSESSMENT &  PLAN:  ***  Follow up in ***  Morene Brownie, MD [facility] Mechanical Circulatory Support 08/02/24

## 2024-08-02 NOTE — Patient Instructions (Signed)
 STOP Bidil   INCREASE Losartan  to 100 mg daily.  INCREASE Spironolactone  to 25 mg daily.  DECREASE Potassium to 20 mEq ( 1 tab) daily.  Your physician has requested that you have an echocardiogram. Echocardiography is a painless test that uses sound waves to create images of your heart. It provides your doctor with information about the size and shape of your heart and how well your heart's chambers and valves are working. This procedure takes approximately one hour. There are no restrictions for this procedure. Please do NOT wear cologne, perfume, aftershave, or lotions (deodorant is allowed). Please arrive 15 minutes prior to your appointment time.  Please note: We ask at that you not bring children with you during ultrasound (echo/ vascular) testing. Due to room size and safety concerns, children are not allowed in the ultrasound rooms during exams. Our front office staff cannot provide observation of children in our lobby area while testing is being conducted. An adult accompanying a patient to their appointment will only be allowed in the ultrasound room at the discretion of the ultrasound technician under special circumstances. We apologize for any inconvenience.  Your physician recommends that you schedule a follow-up appointment in: 3 months with an echocardiogram.  If you have any questions or concerns before your next appointment please send us  a message through [place] or call our office at [phone].    TO LEAVE A MESSAGE FOR THE NURSE SELECT OPTION 2, PLEASE LEAVE A MESSAGE INCLUDING: YOUR NAME DATE OF BIRTH CALL BACK NUMBER REASON FOR CALL**this is important as we prioritize the call backs  YOU WILL RECEIVE A CALL BACK THE SAME DAY AS LONG AS YOU CALL BEFORE 4:00 PM  At the [facility], you and your health needs are our priority. As part of our continuing mission to provide you with exceptional heart care, we have created designated Provider Care Teams.  These Care Teams include your primary Cardiologist (physician) and Advanced Practice Providers (APPs- Physician Assistants and Nurse Practitioners) who all work together to provide you with the care you need, when you need it.   You may see any of the following providers on your designated Care Team at your next follow up: Dr Toribio Fuel Dr Ezra Shuck Dr. Ria Commander Dr. Morene Brownie Amy Lenetta, NP Caffie Shed, [place] [facility] [place] [employer], [place] Beckey Coe, NP [place] Lee, NP Ellouise Class, NP Tinnie Redman, PharmD Jaun Bash, PharmD   Please be sure to bring in all your medications bottles to every appointment.    Thank you for choosing [facility]

## 2024-08-05 ENCOUNTER — Other Ambulatory Visit: Payer: Self-pay | Admitting: Neurology

## 2024-08-06 NOTE — Telephone Encounter (Signed)
 Last seen on 06/01/24 Follow up scheduled on 01/20/25    Dispensed Days Supply Quantity Provider Pharmacy  clobazam  10 mg tablet 08/05/2024 30 30 tablet Gayland Lauraine PARAS, NP [facility]   Please deny Rx was just filled on 08/05/24 for 30 day supply.

## 2024-08-20 ENCOUNTER — Telehealth (HOSPITAL_COMMUNITY): Payer: Self-pay | Admitting: Cardiology

## 2024-08-24 ENCOUNTER — Other Ambulatory Visit: Payer: Self-pay | Admitting: Neurology

## 2024-08-24 MED ORDER — CLOBAZAM 10 MG PO TABS: 10.0000 mg | ORAL_TABLET | Freq: Every day | ORAL | 5 refills | Status: AC

## 2024-08-24 MED ORDER — LAMOTRIGINE 200 MG PO TABS: 300.0000 mg | ORAL_TABLET | Freq: Two times a day (BID) | ORAL | 1 refills | Status: DC

## 2024-08-24 NOTE — Telephone Encounter (Signed)
 Requested Prescriptions   Pending Prescriptions Disp Refills   cloBAZam  (ONFI ) 10 MG tablet 30 tablet 5    Sig: Take 1 tablet (10 mg total) by mouth at bedtime.   lamoTRIgine  (LAMICTAL ) 200 MG tablet 270 tablet 1    Sig: Take 1.5 tablets (300 mg total) by mouth 2 (two) times daily.  I AM SHOWING THAT SHE SHOULDN'T NEED REFILL UNTIL 09/05/24 Last seen 06/01/24 Next appt 01/20/25 Dispenses   Dispensed Days Supply Quantity Provider Pharmacy  CLOBAZAM  10 MG TABS 08/05/2024 30 30 each Gayland Lauraine PARAS, NP [facility] ...  CLOBAZAM  10 MG TABS 07/07/2024 30 30 each Gayland Lauraine PARAS, NP [facility] ...  clobazam  10 mg tablet 06/13/2024 30 30 tablet Gayland Lauraine PARAS, NP [facility] ...  clobazam  10 mg tablet 05/21/2024 30 30 tablet Gayland Lauraine PARAS, NP [facility] ...  CLOBAZAM  10 MG TABS 04/28/2024 30 30 each Gayland Lauraine PARAS, NP [facility] ...  CLOBAZAM  10 MG TABS 04/19/2024 30 30 each Gayland Lauraine PARAS, NP [facility] ...  clobazam  10 mg tablet 04/19/2024 12 12 tablet Gayland Lauraine PARAS, NP [facility] ...  CLOBAZAM  10 MG TABS 03/19/2024 30 30 each Gayland Lauraine PARAS, NP [facility] ...  CLOBAZAM  10 MG TABS 03/12/2024 30 30 each Gayland Lauraine PARAS, NP [facility] ...  CLOBAZAM  10 MG TABLET 02/18/2024 30 30 each Gayland Lauraine PARAS, NP [facility] [phone] - G...  CLOBAZAM  10 MG TABLET 01/09/2024 30 30 each Gayland Lauraine PARAS, NP [facility] [phone] - G...  CLOBAZAM  10 MG TABLET 12/10/2023 30 30 each Gayland Lauraine PARAS, NP [facility] [phone] - G...  CLOBAZAM  10 MG TABLET 11/11/2023 30 30 each Gayland Lauraine PARAS, NP [facility] [phone] - G...  CLOBAZAM  10 MG TABLET 10/13/2023 30 30 each Gayland Lauraine PARAS, NP [facility] [phone] - G...  CLOBAZAM  10 MG TABLET 09/15/2023 30 30 each Gayland Lauraine PARAS, NP [facility] [phone] - G...    Dispenses   Dispensed Days Supply Quantity Provider Pharmacy  CLOBAZAM  10 MG TABS 08/05/2024 30 30 each Gayland Lauraine PARAS, NP  [facility] ...  CLOBAZAM  10 MG TABS 07/07/2024 30 30 each Gayland Lauraine PARAS, NP [facility] ...  clobazam  10 mg tablet 06/13/2024 30 30 tablet Gayland Lauraine PARAS, NP [facility] ...  clobazam  10 mg tablet 05/21/2024 30 30 tablet Gayland Lauraine PARAS, NP [facility] ...  CLOBAZAM  10 MG TABS 04/28/2024 30 30 each Gayland Lauraine PARAS, NP [facility] ...  CLOBAZAM  10 MG TABS 04/19/2024 30 30 each Gayland Lauraine PARAS, NP [facility] ...  clobazam  10 mg tablet 04/19/2024 12 12 tablet Gayland Lauraine PARAS, NP [facility] ...  CLOBAZAM  10 MG TABS 03/19/2024 30 30 each Gayland Lauraine PARAS, NP [facility] ...  CLOBAZAM  10 MG TABS 03/12/2024 30 30 each Gayland Lauraine PARAS, NP [facility] ...  CLOBAZAM  10 MG TABLET 02/18/2024 30 30 each Gayland Lauraine PARAS, NP [facility] [phone] - G...  CLOBAZAM  10 MG TABLET 01/09/2024 30 30 each Gayland Lauraine PARAS, NP [facility] [phone] - G...  CLOBAZAM  10 MG TABLET 12/10/2023 30 30 each Gayland Lauraine PARAS, NP [facility] [phone] - G...  CLOBAZAM  10 MG TABLET 11/11/2023 30 30 each Gayland Lauraine PARAS, NP [facility] [phone] - G...  CLOBAZAM  10 MG TABLET 10/13/2023 30 30 each Gayland Lauraine PARAS, NP [facility] [phone] - G...  CLOBAZAM  10 MG TABLET 09/15/2023 30 30 each Gayland Lauraine PARAS, NP [facility] [phone] - G.[email or web address]

## 2024-08-24 NOTE — Telephone Encounter (Signed)
 Patient requesting a refill for cloBAZam  (ONFI ) 10 MG tablet and lamoTRIgine  (LAMICTAL ) 200 MG tablet due to did not receive a mail order package for medication from [employer]. [facility] had sent out in September. For an emergency can you send refills to  [facility] [phone]

## 2024-08-26 ENCOUNTER — Other Ambulatory Visit: Payer: Self-pay

## 2024-08-26 NOTE — Telephone Encounter (Signed)
 Requested Prescriptions   Pending Prescriptions Disp Refills   cloBAZam  (ONFI ) 10 MG tablet 30 tablet 5    Sig: Take 1 tablet (10 mg total) by mouth at bedtime.   Last seen 06/01/24 Next appt not scheduled Dispenses   Dispensed Days Supply Quantity Provider Pharmacy  CLOBAZAM  10 MG TABS 08/05/2024 30 30 each Gayland Lauraine PARAS, NP [facility] ...  CLOBAZAM  10 MG TABS 07/07/2024 30 30 each Gayland Lauraine PARAS, NP [facility] ...  clobazam  10 mg tablet 06/13/2024 30 30 tablet Gayland Lauraine PARAS, NP [facility] ...  clobazam  10 mg tablet 05/21/2024 30 30 tablet Gayland Lauraine PARAS, NP [facility] ...  CLOBAZAM  10 MG TABS 04/28/2024 30 30 each Gayland Lauraine PARAS, NP [facility] ...  CLOBAZAM  10 MG TABS 04/19/2024 30 30 each Gayland Lauraine PARAS, NP [facility] ...  clobazam  10 mg tablet 04/19/2024 12 12 tablet Gayland Lauraine PARAS, NP [facility] ...  CLOBAZAM  10 MG TABS 03/19/2024 30 30 each Gayland Lauraine PARAS, NP [facility] ...  CLOBAZAM  10 MG TABS 03/12/2024 30 30 each Gayland Lauraine PARAS, NP [facility] ...  CLOBAZAM  10 MG TABLET 02/18/2024 30 30 each Gayland Lauraine PARAS, NP [facility] [phone] - G...  CLOBAZAM  10 MG TABLET 01/09/2024 30 30 each Gayland Lauraine PARAS, NP [facility] [phone] - G...  CLOBAZAM  10 MG TABLET 12/10/2023 30 30 each Gayland Lauraine PARAS, NP [facility] [phone] - G...  CLOBAZAM  10 MG TABLET 11/11/2023 30 30 each Gayland Lauraine PARAS, NP [facility] [phone] - G...  CLOBAZAM  10 MG TABLET 10/13/2023 30 30 each Gayland Lauraine PARAS, NP [facility] [phone] - G...  CLOBAZAM  10 MG TABLET 09/15/2023 30 30 each Gayland Lauraine PARAS, NP [facility] [phone] - G...      Since mail order and may take time to get to pt I will send to escribe

## 2024-09-07 ENCOUNTER — Ambulatory Visit (INDEPENDENT_AMBULATORY_CARE_PROVIDER_SITE_OTHER)

## 2024-09-07 VITALS — BP 106/72 | HR 75 | Temp 98.2°F | Ht 62.0 in | Wt 278.8 lb

## 2024-09-07 DIAGNOSIS — Z6841 Body Mass Index (BMI) 40.0 and over, adult: Secondary | ICD-10-CM

## 2024-09-07 DIAGNOSIS — G4733 Obstructive sleep apnea (adult) (pediatric): Secondary | ICD-10-CM | POA: Diagnosis not present

## 2024-09-07 DIAGNOSIS — R413 Other amnesia: Secondary | ICD-10-CM

## 2024-09-07 DIAGNOSIS — I5022 Chronic systolic (congestive) heart failure: Secondary | ICD-10-CM

## 2024-09-07 DIAGNOSIS — G4734 Idiopathic sleep related nonobstructive alveolar hypoventilation: Secondary | ICD-10-CM

## 2024-09-07 DIAGNOSIS — Z789 Other specified health status: Secondary | ICD-10-CM

## 2024-09-07 DIAGNOSIS — G40909 Epilepsy, unspecified, not intractable, without status epilepticus: Secondary | ICD-10-CM

## 2024-09-07 NOTE — Assessment & Plan Note (Signed)
As per neurology 

## 2024-09-07 NOTE — Progress Notes (Signed)
 New Patient Pulmonology Office Visit   Subjective:  Patient ID: Brandi Gomez, female    DOB: 1971-06-09  MRN: [id number]  Referred by: Stephen Rojelio PARAS, NP  CC:  Chief Complaint  Patient presents with   Consult    Referred for OSA Had sleep study done roughlu 3 months ago, per pt. Uses CPAP nightly, uses [employer]. Pt states she has had CPAP for 5 years and needs a new one.    HPI Brandi Gomez is a 53 y.o. female with history of seizure disorders, memory loss, OSA on CPAP since 2014, chronic systolic congestive heart failure, diabetes mellitus, obesity, hypertension who is referred to this clinic from neurology clinic for sleep apnea. Patient has been using AutoPap 7-12 cm with EPR of 3.  Reportedly her old machine is pest infested and she is in need for a new machine.  A new in-lab sleep study was performed as [facility].  Few months ago.  Test from July/2025 showed 4 arousals per hour.  She had 87.3% sleep efficiency.  There was 0% REM sleep.  A repeat sleep study at home was advised by the reading physician. She was noted to have nocturnal hypoxemia during that test.  Oxygen  saturation was below 88% for total 22.7 minutes, patient was placed on 1 L supplemental oxygen .  Lowest oxygen  saturation was 82%  Her prior sleep study from 2014 shows AHI of 10.  Patient reports chronic shortness of breath, occasional sneezing and joint stiffness  Her significant other was with her in the appointment today    ROS Review of symptoms negative except mentioned above   Allergies: Amoxicillin , Lisinopril , Hydrocodone , and Tegretol  [carbamazepine ]  Current Outpatient Medications:    ACCU-CHEK GUIDE TEST test strip, daily., Disp: , Rfl:    acetaminophen  (TYLENOL ) 325 MG tablet, Take 2 tablets (650 mg total) by mouth every 4 (four) hours as needed for mild pain (or temp > 37.5 C (99.5 F))., Disp:  , Rfl:    ARIPiprazole  (ABILIFY ) 10 MG tablet, Take 0.5 tablets  (5 mg total) by mouth daily., Disp: 30 tablet, Rfl: 2   atorvastatin  (LIPITOR) 40 MG tablet, TAKE 1 TABLET (40 MG TOTAL) BY MOUTH DAILY AT 6 PM FOR CHOLESTEROL, Disp: 90 tablet, Rfl: 0   carvedilol  (COREG ) 6.25 MG tablet, Take 6.25 mg by mouth 2 (two) times daily., Disp: , Rfl:    chlorhexidine (PERIDEX) 0.12 % solution, Use as directed 15 mLs in the mouth or throat daily., Disp: , Rfl:    cloBAZam  (ONFI ) 10 MG tablet, Take 1 tablet (10 mg total) by mouth at bedtime., Disp: 30 tablet, Rfl: 5   cyanocobalamin  (VITAMIN B12) 1000 MCG tablet, Take 1 tablet (1,000 mcg total) by mouth daily., Disp: 90 tablet, Rfl: 0   ferrous sulfate  325 (65 FE) MG tablet, Take 325 mg by mouth daily with breakfast., Disp: , Rfl:    glipiZIDE  (GLUCOTROL ) 10 MG tablet, Take 10 mg by mouth 2 (two) times daily., Disp: , Rfl:    hydrOXYzine  (VISTARIL ) 25 MG capsule, 1 QHS  1 qday PRN, Disp: 60 capsule, Rfl: 5   lamoTRIgine  (LAMICTAL ) 200 MG tablet, Take 1.5 tablets (300 mg total) by mouth 2 (two) times daily., Disp: 270 tablet, Rfl: 1   losartan  (COZAAR ) 100 MG tablet, Take 1 tablet (100 mg total) by mouth daily., Disp: 90 tablet, Rfl: 3   Microlet Lancets MISC, daily., Disp: , Rfl:    MOUNJARO 5 MG/0.5ML Pen, SMARTSIG:0.5 Milliliter(s) SUB-Q Once  a Week, Disp: , Rfl:    potassium chloride  SA (KLOR-CON  M) 20 MEQ tablet, Take 1 tablet (20 mEq total) by mouth daily., Disp: 90 tablet, Rfl: 3   sitaGLIPtin  (JANUVIA ) 100 MG tablet, Take 100 mg by mouth daily., Disp: , Rfl:    spironolactone  (ALDACTONE ) 25 MG tablet, Take 1 tablet (25 mg total) by mouth daily., Disp: 90 tablet, Rfl: 3   SYMBICORT  160-4.5 MCG/ACT inhaler, SMARTSIG:2 Puff(s) By Mouth Morning-Evening, Disp: , Rfl:    torsemide  40 MG TABS, Take 40 mg by mouth 2 (two) times daily., Disp: 60 tablet, Rfl: 11   Vitamin D , Ergocalciferol , (DRISDOL ) 1.25 MG (50000 UNIT) CAPS capsule, Take 1 capsule (50,000 Units total) by mouth every 7 (seven) days., Disp: 12 capsule, Rfl:  0 Past Medical History:  Diagnosis Date   Anemia    Anxiety    Bipolar 1 disorder (HCC)    Common migraine 05/19/2015   Depression    Diabetes mellitus, type II (HCC)    Hypertension    Irritable bowel syndrome (IBS)    Mild mental retardation    Obesity    Partial complex seizure disorder with intractable epilepsy (HCC) 05/12/2014   Seizures (HCC)    intractable, sz 08/23/17   Sleep apnea    Stroke (HCC)    Type II or unspecified type diabetes mellitus without mention of complication, not stated as uncontrolled    Past Surgical History:  Procedure Laterality Date   BUBBLE STUDY  11/15/2019   Procedure: BUBBLE STUDY;  Surgeon: Maranda Leim DEL, MD;  Location: [facility] ENDOSCOPY;  Service: Cardiovascular;;   COLONOSCOPY     2012-normal , Dr Jakie   ESOPHAGOGASTRODUODENOSCOPY     normal-Dr Jakie 2012   LOOP RECORDER INSERTION N/A 05/30/2017   Procedure: Loop Recorder Insertion;  Surgeon: Inocencio Soyla Lunger, MD;  Location: [facility] INVASIVE CV LAB;  Service: Cardiovascular;  Laterality: N/A;   LOOP RECORDER REMOVAL N/A 03/04/2018   Procedure: LOOP RECORDER REMOVAL;  Surgeon: Inocencio Soyla Lunger, MD;  Location: [facility] INVASIVE CV LAB;  Service: Cardiovascular;  Laterality: N/A;   MYRINGOTOMY WITH TUBE PLACEMENT     MYRINGOTOMY WITH TUBE PLACEMENT Right 11/05/2017   Procedure: MYRINGOTOMY WITH TUBE PLACEMENT;  Surgeon: Roark Rush, MD;  Location: [facility] OR;  Service: ENT;  Laterality: Right;  right T Tube placement   NASAL SINUS SURGERY     RIGHT HEART CATH AND CORONARY ANGIOGRAPHY N/A 03/08/2024   Procedure: RIGHT HEART CATH AND CORONARY ANGIOGRAPHY;  Surgeon: Zenaida Morene PARAS, MD;  Location: [facility] INVASIVE CV LAB;  Service: Cardiovascular;  Laterality: N/A;   TEE WITHOUT CARDIOVERSION N/A 05/30/2017   Procedure: TRANSESOPHAGEAL ECHOCARDIOGRAM (TEE);  Surgeon: Alveta Aleene PARAS, MD;  Location: [facility] ENDOSCOPY;  Service: Cardiovascular;  Laterality: N/A;   TEE WITHOUT CARDIOVERSION N/A 11/15/2019    Procedure: TRANSESOPHAGEAL ECHOCARDIOGRAM (TEE);  Surgeon: Maranda Leim DEL, MD;  Location: [facility] ENDOSCOPY;  Service: Cardiovascular;  Laterality: N/A;   Family History  Problem Relation Age of Onset   Diabetes Mother        passed away from accidental death   Hypertension Mother    Bipolar disorder Father    Diabetes Daughter    Leukemia Daughter    Ovarian cancer Maternal Aunt    Social History   Socioeconomic History   Marital status: Single    Spouse name: Elsie   Number of children: 3   Years of education: 12   Highest education level: Not on file  Occupational History  Occupation: disabled    Employer: DISABLED  Tobacco Use   Smoking status: Never   Smokeless tobacco: Never  Vaping Use   Vaping status: Never Used  Substance and Sexual Activity   Alcohol use: No   Drug use: No   Sexual activity: Yes  Other Topics Concern   Not on file  Social History Narrative   Patient lives at home with daughter.    Patient has 3 children.    Patient is right handed.    Patient has a high school education.    Patient is on disability   Patient drinks 2 cups of caffeine daily.   Social Drivers of Corporate investment banker Strain: Low Risk  (02/04/2024)   Overall Financial Resource Strain (CARDIA)    Difficulty of Paying Living Expenses: Not very hard  Food Insecurity: No Food Insecurity (02/03/2024)   Hunger Vital Sign    Worried About Running Out of Food in the Last Year: Never true    Ran Out of Food in the Last Year: Never true  Transportation Needs: No Transportation Needs (02/03/2024)   PRAPARE - Administrator, Civil Service (Medical): No    Lack of Transportation (Non-Medical): No  Physical Activity: Not on file  Stress: Not on file  Social Connections: Not on file  Intimate Partner Violence: Not At Risk (02/03/2024)   Humiliation, Afraid, Rape, and Kick questionnaire    Fear of Current or Ex-Partner: No    Emotionally Abused: No    Physically  Abused: No    Sexually Abused: No         Objective:  BP 106/72   Pulse 75   Temp 98.2 F (36.8 C)   Ht 5' 2 (1.575 m)   Wt 278 lb 12.8 oz (126.5 kg)   LMP  (LMP Unknown)   SpO2 98% Comment: RA  BMI 50.99 kg/m    Physical Exam Constitutional:      General: She is not in acute distress.    Appearance: Normal appearance. She is obese.  HENT:     Mouth/Throat:     Mouth: Mucous membranes are moist.  Cardiovascular:     Rate and Rhythm: Normal rate.  Pulmonary:     Effort: No respiratory distress.     Breath sounds: No wheezing or rales.  Musculoskeletal:     Right lower leg: No edema.     Left lower leg: No edema.  Skin:    General: Skin is warm.  Neurological:     Mental Status: She is alert and oriented to person, place, and time.  Psychiatric:        Mood and Affect: Mood normal.     Diagnostic Review:    Pft     No data to display             I personally reviewed her split-night study from 2014 and in-lab sleep study from 06/2024  In lab sleep Test from July/2025 showed 4 arousals per hour.  She had 87.3% sleep efficiency.  There was 0% REM sleep.  She was noted to have nocturnal hypoxemia during that test.  Oxygen  saturation was below 88% for total 22.7 minutes, patient was placed on 1 L supplemental oxygen .  Lowest oxygen  saturation was 82%   Echo 01/2024 1. Left ventricular ejection fraction, by estimation, is 30 to 35%. The  left ventricle has moderately decreased function. The left ventricle  demonstrates global hypokinesis. The left ventricular internal cavity size  was mildly dilated. Left ventricular  diastolic parameters are consistent with Grade II diastolic dysfunction  (pseudonormalization).   2. Right ventricular systolic function is normal. The right ventricular  size is normal. Tricuspid regurgitation signal is inadequate for assessing  PA pressure.   3. Left atrial size was moderately dilated.   4. The mitral valve is normal in  structure. Trivial mitral valve  regurgitation. No evidence of mitral stenosis.   5. The aortic valve is tricuspid. Aortic valve regurgitation is not  visualized. No aortic stenosis is present.   6. Aortic dilatation noted. There is borderline dilatation of the  ascending aorta, measuring 37 mm.   7. The inferior vena cava is normal in size with <50% respiratory  variability, suggesting right atrial pressure of 8 mmHg.    Neurology clinic notes and telephone encounters reviewed  Cardiology notes reviewed.    Chest x-ray March/2025 reviewed, no acute cardiac findings  Assessment & Plan:   Assessment & Plan OSA on CPAP Patient with known history of sleep apnea in 2014, AHI 10 at that time. Recent new in-lab sleep study showed AHI of 4.7, however was suboptimal test as patient has 0% REM.  It did show nocturnal hypoxemia Patient has morbid obesity and also has chronic systolic and congestive diastolic heart failure.  Does not have evidence of overt volume overload on exam I reviewed sleep study personally and discussed findings with patient and her significant other Patient has perceived significant benefit from CPAP machine and would like to continue.  I reviewed recent compliance data card download which shows excellent compliance Since in-lab sleep study was suboptimal, we will proceed with home sleep study to document that she still has sleep apnea.  Patient reports that has gained weight since last sleep study 11 years ago. If we are able to prescribe her a new APAP machine, would like to obtain overnight oximetry on APAP at a later point. Orders:   Home sleep test; Future  Congestive heart failure, NYHA class 1, chronic, systolic (HCC) Advised patient to call cardiology clinic to set up a follow-up Advised low-salt diet    BMI 50.0-59.9, adult (HCC) Advised weight loss with exercise and diet control     Memory problem Follows with neurology    Seizure disorder [facility]) As  per neurology     Difficulty using continuous positive airway pressure (CPAP) device Current machine is not clean, pest infested reportedly She is needing a new machine Will obtain a home sleep study    Nocturnal hypoxemia As above     Thank you for the opportunity to take part in the care of Brandi Gomez   Return in about 8 weeks (around 11/02/2024).   Gearline Spilman Pleas, MD [facility] Office: [phone]   I personally spent a total of 60 minutes in the care of the patient today including performing an extensive chart review as mentioned above, medically appropriate exam/evaluation, counseling and educating, placing orders, referring and communicating with other health care professionals, documenting clinical information in the EHR, independently interpreting results, and communicating results.

## 2024-09-07 NOTE — Patient Instructions (Signed)
 It was a pleasure to see you today. Your will receive a call regarding your home sleep study We will see you after we have the reports of your home sleep study

## 2024-09-14 ENCOUNTER — Telehealth: Payer: Self-pay

## 2024-09-14 NOTE — Telephone Encounter (Signed)
 Pt is asking for a call to discuss why she has not been able to get the refill of this medication, please call.

## 2024-09-14 NOTE — Addendum Note (Signed)
 Addended by: ONEITA NEVELYN BRAVO on: 09/14/2024 09:37 AM   Modules accepted: Orders

## 2024-09-14 NOTE — Telephone Encounter (Signed)
 Onfi  was sent on 08/24/24, picked up 08/28/24

## 2024-09-14 NOTE — Telephone Encounter (Signed)
 Call to patient, she is calling about clobazem. I advised it was too soon to fill. She need to call closer to her refill date. She verbalized understanding and stated she had enough medication

## 2024-09-14 NOTE — Telephone Encounter (Signed)
 Patient LVM on sleep lab phone but did not specify what concerns she had, I returned her call and they were medication questions so I told her I would transfer her to our main clinic line where someone could assist her. Patient understood. While on the phone I noticed she completed a sleep consultation with [facility] and they ordered a SNAP HST for her. I let the patient know that if she completed the sleep study through Pulmonology we would no longer be able to see her for her sleep concerns, she could still follow for her other neuro concerns but we would not manage her CPAP. She understood and was agreeable to only being seen for sleep through [facility]. I then transferred the patient to the main clinic line for further assistance about medication.

## 2024-09-17 ENCOUNTER — Telehealth: Payer: Self-pay

## 2024-09-17 NOTE — Telephone Encounter (Signed)
 Copied from CRM [id number]. Topic: Clinical - Request for Lab/Test Order >> Sep 15, 2024  9:21 AM [name] wrote: Reason for CRM: patient is calling because she say that she spoke with dr neda about a home sleep test . Patient say she spoke with [place] who said that she can do it at a facility and they will cover it . Patient is wanting to do it at a facility instead of at home . Please reach out to patient concerning this  [phone] >> Sep 16, 2024  9:25 AM [name] wrote: Patient calling back stating she changed her mind and would rather do the test at home   Order has already been placed for HST.

## 2024-09-20 ENCOUNTER — Other Ambulatory Visit: Payer: Self-pay | Admitting: Neurology

## 2024-09-20 NOTE — Telephone Encounter (Signed)
 Pt called this morning to inquire about the status of this medication, the latest response from CMA was relayed to her.  Pt stated she will reach out to the pharmacy.

## 2024-09-20 NOTE — Telephone Encounter (Signed)
 Pt called stating that she only has 7 left of her cloBAZam  (ONFI ) 10 MG tablet and is needing a refill request sent in to  [facility] for the cloBAZam  (ONFI ) 10 MG tablet. Please advise.

## 2024-09-20 NOTE — Telephone Encounter (Signed)
 Requested Prescriptions   Pending Prescriptions Disp Refills   cloBAZam  (ONFI ) 10 MG tablet 30 tablet 5    Sig: Take 1 tablet (10 mg total) by mouth at bedtime.   Last seen 06/01/24 Next appt 01/20/25  Dispenses   Dispensed Days Supply Quantity Provider Pharmacy  CLOBAZAM  10 MG TABLET 08/28/2024 30 30 each Camara, Amadou, MD [facility] [phone] - [facility]...  CLOBAZAM  10 MG TABS 08/05/2024 30 30 each Gayland Lauraine PARAS, NP [facility] ...  CLOBAZAM  10 MG TABS 07/07/2024 30 30 each Gayland Lauraine PARAS, NP [facility] ...  clobazam  10 mg tablet 06/13/2024 30 30 tablet Gayland Lauraine PARAS, NP [facility] ...  clobazam  10 mg tablet 05/21/2024 30 30 tablet Gayland Lauraine PARAS, NP [facility] ...  CLOBAZAM  10 MG TABS 04/28/2024 30 30 each Gayland Lauraine PARAS, NP [facility] ...  CLOBAZAM  10 MG TABS 04/19/2024 30 30 each Gayland Lauraine PARAS, NP [facility] ...  clobazam  10 mg tablet 04/19/2024 12 12 tablet Gayland Lauraine PARAS, NP [facility] ...  CLOBAZAM  10 MG TABS 03/19/2024 30 30 each Gayland Lauraine PARAS, NP [facility] ...  CLOBAZAM  10 MG TABS 03/12/2024 30 30 each Gayland Lauraine PARAS, NP [facility] ...  CLOBAZAM  10 MG TABLET 02/18/2024 30 30 each Gayland Lauraine PARAS, NP [facility] [phone] - [facility]...  CLOBAZAM  10 MG TABLET 01/09/2024 30 30 each Gayland Lauraine PARAS, NP [facility] [phone] - [facility]...  CLOBAZAM  10 MG TABLET 12/10/2023 30 30 each Gayland Lauraine PARAS, NP [facility] [phone] - G...  CLOBAZAM  10 MG TABLET 11/11/2023 30 30 each Gayland Lauraine PARAS, NP [facility] [phone] - G...  CLOBAZAM  10 MG TABLET 10/13/2023 30 30 each Gayland Lauraine PARAS, NP [facility] [phone] - G.SABRASABRA

## 2024-09-20 NOTE — Telephone Encounter (Signed)
 She has 5 refills left.

## 2024-09-23 ENCOUNTER — Telehealth: Payer: Self-pay

## 2024-09-23 DIAGNOSIS — G4733 Obstructive sleep apnea (adult) (pediatric): Secondary | ICD-10-CM

## 2024-09-23 NOTE — Telephone Encounter (Signed)
 Yes, please arrange/order in lab sleep study.

## 2024-09-23 NOTE — Telephone Encounter (Signed)
 This has been sent to be scheduled

## 2024-09-23 NOTE — Telephone Encounter (Signed)
 Please schedule split night

## 2024-09-23 NOTE — Telephone Encounter (Signed)
 Copied from CRM [id number]. Topic: Clinical - Request for Lab/Test Order >> Sep 23, 2024 10:47 AM Brandi Gomez wrote: Reason for CRM: Patient is calling because she was unable to do her in home sleep study due to the device being too heavy + causing a bunch of snoring, she is wondering if she could do an in clinic sleep study so that she can be approved for a CPAP machine.

## 2024-10-12 ENCOUNTER — Other Ambulatory Visit (HOSPITAL_COMMUNITY): Payer: Self-pay | Admitting: Psychiatry

## 2024-10-20 ENCOUNTER — Ambulatory Visit (HOSPITAL_COMMUNITY): Admitting: Psychiatry

## 2024-11-02 ENCOUNTER — Ambulatory Visit

## 2024-11-18 ENCOUNTER — Observation Stay (HOSPITAL_COMMUNITY)
Admission: EM | Admit: 2024-11-18 | Discharge: 2024-11-21 | Disposition: A | Attending: Emergency Medicine | Admitting: Emergency Medicine

## 2024-11-18 ENCOUNTER — Other Ambulatory Visit: Payer: Self-pay

## 2024-11-18 ENCOUNTER — Other Ambulatory Visit (HOSPITAL_COMMUNITY): Payer: Self-pay | Admitting: Psychiatry

## 2024-11-18 ENCOUNTER — Emergency Department (HOSPITAL_COMMUNITY)

## 2024-11-18 DIAGNOSIS — Z79899 Other long term (current) drug therapy: Secondary | ICD-10-CM | POA: Insufficient documentation

## 2024-11-18 DIAGNOSIS — G4733 Obstructive sleep apnea (adult) (pediatric): Secondary | ICD-10-CM | POA: Insufficient documentation

## 2024-11-18 DIAGNOSIS — G40909 Epilepsy, unspecified, not intractable, without status epilepticus: Secondary | ICD-10-CM | POA: Diagnosis not present

## 2024-11-18 DIAGNOSIS — Z6841 Body Mass Index (BMI) 40.0 and over, adult: Secondary | ICD-10-CM | POA: Diagnosis not present

## 2024-11-18 DIAGNOSIS — R7989 Other specified abnormal findings of blood chemistry: Secondary | ICD-10-CM | POA: Diagnosis not present

## 2024-11-18 DIAGNOSIS — R531 Weakness: Secondary | ICD-10-CM | POA: Insufficient documentation

## 2024-11-18 DIAGNOSIS — Z794 Long term (current) use of insulin: Secondary | ICD-10-CM | POA: Diagnosis not present

## 2024-11-18 DIAGNOSIS — R55 Syncope and collapse: Secondary | ICD-10-CM | POA: Diagnosis present

## 2024-11-18 DIAGNOSIS — I951 Orthostatic hypotension: Secondary | ICD-10-CM | POA: Insufficient documentation

## 2024-11-18 DIAGNOSIS — N1831 Chronic kidney disease, stage 3a: Secondary | ICD-10-CM | POA: Diagnosis not present

## 2024-11-18 DIAGNOSIS — E785 Hyperlipidemia, unspecified: Secondary | ICD-10-CM | POA: Diagnosis not present

## 2024-11-18 DIAGNOSIS — N179 Acute kidney failure, unspecified: Secondary | ICD-10-CM | POA: Diagnosis present

## 2024-11-18 DIAGNOSIS — D631 Anemia in chronic kidney disease: Secondary | ICD-10-CM | POA: Diagnosis not present

## 2024-11-18 DIAGNOSIS — F319 Bipolar disorder, unspecified: Secondary | ICD-10-CM | POA: Diagnosis not present

## 2024-11-18 DIAGNOSIS — I130 Hypertensive heart and chronic kidney disease with heart failure and stage 1 through stage 4 chronic kidney disease, or unspecified chronic kidney disease: Principal | ICD-10-CM | POA: Insufficient documentation

## 2024-11-18 DIAGNOSIS — D500 Iron deficiency anemia secondary to blood loss (chronic): Secondary | ICD-10-CM | POA: Insufficient documentation

## 2024-11-18 DIAGNOSIS — E1122 Type 2 diabetes mellitus with diabetic chronic kidney disease: Secondary | ICD-10-CM | POA: Diagnosis not present

## 2024-11-18 DIAGNOSIS — N183 Chronic kidney disease, stage 3 unspecified: Secondary | ICD-10-CM | POA: Diagnosis present

## 2024-11-18 DIAGNOSIS — R413 Other amnesia: Secondary | ICD-10-CM | POA: Insufficient documentation

## 2024-11-18 DIAGNOSIS — I5042 Chronic combined systolic (congestive) and diastolic (congestive) heart failure: Secondary | ICD-10-CM | POA: Insufficient documentation

## 2024-11-18 DIAGNOSIS — F3111 Bipolar disorder, current episode manic without psychotic features, mild: Secondary | ICD-10-CM | POA: Insufficient documentation

## 2024-11-18 DIAGNOSIS — E1165 Type 2 diabetes mellitus with hyperglycemia: Secondary | ICD-10-CM | POA: Insufficient documentation

## 2024-11-18 LAB — BASIC METABOLIC PANEL WITH GFR
Anion gap: 11 (ref 5–15)
BUN: 41 mg/dL — ABNORMAL HIGH (ref 6–20)
CO2: 22 mmol/L (ref 22–32)
Calcium: 8.5 mg/dL — ABNORMAL LOW (ref 8.9–10.3)
Chloride: 105 mmol/L (ref 98–111)
Creatinine, Ser: 3.07 mg/dL — ABNORMAL HIGH (ref 0.44–1.00)
GFR, Estimated: 17 mL/min — ABNORMAL LOW (ref >60)
Glucose, Bld: 139 mg/dL — ABNORMAL HIGH (ref 70–99)
Potassium: 4.2 mmol/L (ref 3.5–5.1)
Sodium: 137 mmol/L (ref 135–145)

## 2024-11-18 LAB — CBC
HCT: 28.6 % — ABNORMAL LOW (ref 36.0–46.0)
Hemoglobin: 8.6 g/dL — ABNORMAL LOW (ref 12.0–15.0)
MCH: 28.8 pg (ref 26.0–34.0)
MCHC: 30.1 g/dL (ref 30.0–36.0)
MCV: 95.7 fL (ref 80.0–100.0)
Platelets: 269 K/uL (ref 150–400)
RBC: 2.99 MIL/uL — ABNORMAL LOW (ref 3.87–5.11)
RDW: 15.6 % — ABNORMAL HIGH (ref 11.5–15.5)
WBC: 6.5 K/uL (ref 4.0–10.5)
nRBC: 0 % (ref 0.0–0.2)

## 2024-11-18 LAB — URINALYSIS, ROUTINE W REFLEX MICROSCOPIC
Bilirubin Urine: NEGATIVE
Glucose, UA: NEGATIVE mg/dL
Hgb urine dipstick: NEGATIVE
Ketones, ur: NEGATIVE mg/dL
Leukocytes,Ua: NEGATIVE
Nitrite: NEGATIVE
Protein, ur: NEGATIVE mg/dL
Specific Gravity, Urine: 1.008 (ref 1.005–1.030)
pH: 5 (ref 5.0–8.0)

## 2024-11-18 LAB — GLUCOSE, CAPILLARY: Glucose-Capillary: 96 mg/dL (ref 70–99)

## 2024-11-18 LAB — COMPREHENSIVE METABOLIC PANEL WITH GFR
ALT: 11 U/L (ref 0–44)
AST: 18 U/L (ref 15–41)
Albumin: 3.9 g/dL (ref 3.5–5.0)
Alkaline Phosphatase: 115 U/L (ref 38–126)
Anion gap: 11 (ref 5–15)
BUN: 44 mg/dL — ABNORMAL HIGH (ref 6–20)
CO2: 24 mmol/L (ref 22–32)
Calcium: 9 mg/dL (ref 8.9–10.3)
Chloride: 102 mmol/L (ref 98–111)
Creatinine, Ser: 3.72 mg/dL — ABNORMAL HIGH (ref 0.44–1.00)
GFR, Estimated: 14 mL/min — ABNORMAL LOW (ref >60)
Glucose, Bld: 161 mg/dL — ABNORMAL HIGH (ref 70–99)
Potassium: 4.4 mmol/L (ref 3.5–5.1)
Sodium: 137 mmol/L (ref 135–145)
Total Bilirubin: 0.3 mg/dL (ref 0.0–1.2)
Total Protein: 7.6 g/dL (ref 6.5–8.1)

## 2024-11-18 LAB — POC OCCULT BLOOD, ED: Fecal Occult Bld: NEGATIVE

## 2024-11-18 LAB — HEMOGLOBIN AND HEMATOCRIT, BLOOD
HCT: 25.5 % — ABNORMAL LOW (ref 36.0–46.0)
Hemoglobin: 8 g/dL — ABNORMAL LOW (ref 12.0–15.0)

## 2024-11-18 LAB — HEMOGLOBIN A1C
Hgb A1c MFr Bld: 5.9 % — ABNORMAL HIGH (ref 4.8–5.6)
Mean Plasma Glucose: 122.63 mg/dL

## 2024-11-18 LAB — CBG MONITORING, ED
Glucose-Capillary: 125 mg/dL — ABNORMAL HIGH (ref 70–99)
Glucose-Capillary: 103 mg/dL — ABNORMAL HIGH (ref 70–99)

## 2024-11-18 LAB — TROPONIN T, HIGH SENSITIVITY
Troponin T High Sensitivity: 43 ng/L — ABNORMAL HIGH (ref 0–19)
Troponin T High Sensitivity: 43 ng/L — ABNORMAL HIGH (ref 0–19)

## 2024-11-18 MED ORDER — ATORVASTATIN CALCIUM 40 MG PO TABS
40.0000 mg | ORAL_TABLET | Freq: Every day | ORAL | Status: DC
  Administered 2024-11-18 – 2024-11-20 (×3): 40 mg via ORAL
  Filled 2024-11-18 (×3): qty 1

## 2024-11-18 MED ORDER — ONDANSETRON HCL 4 MG PO TABS: 4.0000 mg | ORAL_TABLET | Freq: Four times a day (QID) | ORAL | Status: DC | PRN

## 2024-11-18 MED ORDER — ARIPIPRAZOLE 5 MG PO TABS
5.0000 mg | ORAL_TABLET | Freq: Every day | ORAL | Status: DC
  Administered 2024-11-19 – 2024-11-21 (×3): 5 mg via ORAL
  Filled 2024-11-18 (×3): qty 1

## 2024-11-18 MED ORDER — CARVEDILOL 3.125 MG PO TABS
6.2500 mg | ORAL_TABLET | Freq: Two times a day (BID) | ORAL | Status: DC
  Administered 2024-11-19 (×2): 6.25 mg via ORAL
  Filled 2024-11-18 (×2): qty 2

## 2024-11-18 MED ORDER — CARVEDILOL 3.125 MG PO TABS: 6.2500 mg | ORAL_TABLET | Freq: Two times a day (BID) | ORAL | Status: DC

## 2024-11-18 MED ORDER — GERHARDT'S BUTT CREAM
TOPICAL_CREAM | Freq: Two times a day (BID) | CUTANEOUS | Status: DC
  Filled 2024-11-18: qty 60

## 2024-11-18 MED ORDER — SODIUM CHLORIDE 0.9 % IV SOLN: Freq: Once | INTRAVENOUS | Status: AC

## 2024-11-18 MED ORDER — SODIUM CHLORIDE 0.9 % IV SOLN: INTRAVENOUS | Status: AC

## 2024-11-18 MED ORDER — SODIUM CHLORIDE 0.9% FLUSH: 3.0000 mL | Freq: Once | INTRAVENOUS | Status: DC

## 2024-11-18 MED ORDER — CLOBAZAM 10 MG PO TABS
10.0000 mg | ORAL_TABLET | Freq: Every day | ORAL | Status: DC
  Administered 2024-11-18 – 2024-11-20 (×3): 10 mg via ORAL
  Filled 2024-11-18 (×3): qty 1

## 2024-11-18 MED ORDER — ACETAMINOPHEN 325 MG PO TABS
650.0000 mg | ORAL_TABLET | Freq: Four times a day (QID) | ORAL | Status: DC | PRN
  Administered 2024-11-18 – 2024-11-19 (×2): 650 mg via ORAL
  Filled 2024-11-18 (×2): qty 2

## 2024-11-18 MED ORDER — ONDANSETRON HCL 4 MG/2ML IJ SOLN: 4.0000 mg | Freq: Four times a day (QID) | INTRAMUSCULAR | Status: DC | PRN

## 2024-11-18 MED ORDER — LAMOTRIGINE 100 MG PO TABS
300.0000 mg | ORAL_TABLET | Freq: Two times a day (BID) | ORAL | Status: DC
  Administered 2024-11-18 – 2024-11-21 (×6): 300 mg via ORAL
  Filled 2024-11-18 (×6): qty 3

## 2024-11-18 MED ORDER — ACETAMINOPHEN 650 MG RE SUPP: 650.0000 mg | Freq: Four times a day (QID) | RECTAL | Status: DC | PRN

## 2024-11-18 MED ORDER — SODIUM CHLORIDE 0.9 % IV BOLUS
1000.0000 mL | Freq: Once | INTRAVENOUS | Status: AC
  Administered 2024-11-18: 1000 mL via INTRAVENOUS

## 2024-11-18 MED ORDER — INSULIN ASPART 100 UNIT/ML IJ SOLN: 0.0000 [IU] | Freq: Three times a day (TID) | INTRAMUSCULAR | Status: DC

## 2024-11-18 NOTE — ED Provider Notes (Signed)
 [place] EMERGENCY DEPARTMENT AT [facility] Provider Note   CSN: [id number] Arrival date & time: 11/18/24  1155     Patient presents with: Loss of Consciousness   Brandi Gomez is a 53 y.o. female.   HPI Patient reports that she has gotten very weak today.  Reports she was unable to get up or get out of bed.  Patient reports at baseline she is not very active but does independently function for dressing and going to the bathroom.  She denies use of a wheelchair or regular use of a walker.  Patient denies she was experiencing any pain.  Noted shortness of breath no chest pain.  She denies she has had recent fevers or chills.  She denies her stool has been bloody or black in appearance.  She reports about 6 months ago she had some rectal bleeding but does not seem like she has had any recently.  Patient reports that she lost her blood glucose monitor.  She thinks her blood sugars have been running around the 80s.  Patient denies she had any focal weakness numbness or tingling.  She just felt generally very weak and unable to get up.    Prior to Admission medications  Medication Sig Start Date End Date Taking? Authorizing Provider  ACCU-CHEK GUIDE TEST test strip daily. 06/13/24   [provider]  acetaminophen  (TYLENOL ) 325 MG tablet Take 2 tablets (650 mg total) by mouth every 4 (four) hours as needed for mild pain (or temp > 37.5 C (99.5 F)). 10/19/19   Angiulli, Toribio PARAS, PA-C  ARIPiprazole  (ABILIFY ) 10 MG tablet Take 0.5 tablets (5 mg total) by mouth daily. 06/16/24   Plovsky, Elna, MD  atorvastatin  (LIPITOR) 40 MG tablet TAKE 1 TABLET (40 MG TOTAL) BY MOUTH DAILY AT 6 PM FOR CHOLESTEROL 09/24/21   Sagardia, Emil Schanz, MD  carvedilol  (COREG ) 6.25 MG tablet Take 6.25 mg by mouth 2 (two) times daily. 05/20/21   [provider]  chlorhexidine (PERIDEX) 0.12 % solution Use as directed 15 mLs in the mouth or throat daily.    [provider]   cloBAZam  (ONFI ) 10 MG tablet Take 1 tablet (10 mg total) by mouth at bedtime. 08/24/24   Gregg Lek, MD  cyanocobalamin  (VITAMIN B12) 1000 MCG tablet Take 1 tablet (1,000 mcg total) by mouth daily. 02/08/24   Bryn Bernardino NOVAK, MD  ferrous sulfate  325 (65 FE) MG tablet Take 325 mg by mouth daily with breakfast. 07/10/22   [provider]  glipiZIDE  (GLUCOTROL ) 10 MG tablet Take 10 mg by mouth 2 (two) times daily. 04/27/22   [provider]  hydrOXYzine  (VISTARIL ) 25 MG capsule 1 QHS  1 qday PRN 02/12/23   Plovsky, Elna, MD  lamoTRIgine  (LAMICTAL ) 200 MG tablet Take 1.5 tablets (300 mg total) by mouth 2 (two) times daily. 08/24/24   Camara, Amadou, MD  losartan  (COZAAR ) 100 MG tablet Take 1 tablet (100 mg total) by mouth daily. 08/02/24 10/31/24  Zenaida Morene PARAS, MD  Microlet Lancets MISC daily. 04/22/24   [provider]  MOUNJARO 5 MG/0.5ML Pen SMARTSIG:0.5 Milliliter(s) SUB-Q Once a Week 04/13/24   [provider]  potassium chloride  SA (KLOR-CON  M) 20 MEQ tablet Take 1 tablet (20 mEq total) by mouth daily. 08/02/24   Zenaida Morene PARAS, MD  sitaGLIPtin  (JANUVIA ) 100 MG tablet Take 100 mg by mouth daily.    [provider]  spironolactone  (ALDACTONE ) 25 MG tablet Take 1 tablet (25 mg total)  by mouth daily. 08/02/24   Zenaida Morene PARAS, MD  SYMBICORT  160-4.5 MCG/ACT inhaler SMARTSIG:2 Puff(s) By Mouth Morning-Evening 06/13/24   [provider]  torsemide  40 MG TABS Take 40 mg by mouth 2 (two) times daily. 03/08/24   Zenaida Morene PARAS, MD  Vitamin D , Ergocalciferol , (DRISDOL ) 1.25 MG (50000 UNIT) CAPS capsule Take 1 capsule (50,000 Units total) by mouth every 7 (seven) days. 07/03/23   Gayland Lauraine PARAS, NP  methylphenidate  (RITALIN ) 5 MG tablet Take 1 tablet (5 mg total) by mouth 2 (two) times daily. Patient not taking: Reported on 12/29/2019 10/29/19 01/01/20  Lorilee Sven SQUIBB, MD    Allergies: Amoxicillin , Lisinopril , Tegretol  [carbamazepine ], and  Hydrocodone     Review of Systems  Updated Vital Signs BP 102/66   Pulse 70   Temp 97.6 F (36.4 C) (Oral)   Resp 16   Ht 5' 2 (1.575 m)   Wt 126.5 kg   LMP  (LMP Unknown)   SpO2 99%   BMI 51.01 kg/m   Physical Exam Constitutional:      Comments: Physically deconditioned and fatigued in appearance.  No respiratory distress.  No confusion.  HENT:     Mouth/Throat:     Pharynx: Oropharynx is clear.  Cardiovascular:     Rate and Rhythm: Normal rate and regular rhythm.  Pulmonary:     Effort: Pulmonary effort is normal.     Breath sounds: Normal breath sounds.  Abdominal:     General: There is no distension.     Palpations: Abdomen is soft.     Tenderness: There is no abdominal tenderness. There is no guarding.  Genitourinary:    Comments: Rectal exam no significant perianal findings or large hemorrhoid.  Digital exam trace brownish-yellow stool in the vault. Musculoskeletal:     Comments: Patient does not have any peripheral edema at this time.  Lower extremities are symmetric.  Feet are warm and dry.  Skin:    General: Skin is warm and dry.  Neurological:     Comments: Patient is alert and oriented.  She follows commands appropriately.  No focal motor deficits.  Psychiatric:     Comments: Patient is calm cooperative and interactive.     (all labs ordered are listed, but only abnormal results are displayed) Labs Reviewed  COMPREHENSIVE METABOLIC PANEL WITH GFR - Abnormal; Notable for the following components:      Result Value   Glucose, Bld 161 (*)    BUN 44 (*)    Creatinine, Ser 3.72 (*)    GFR, Estimated 14 (*)    All other components within normal limits  CBC - Abnormal; Notable for the following components:   RBC 2.99 (*)    Hemoglobin 8.6 (*)    HCT 28.6 (*)    RDW 15.6 (*)    All other components within normal limits  URINALYSIS, ROUTINE W REFLEX MICROSCOPIC - Abnormal; Notable for the following components:   Color, Urine STRAW (*)    APPearance HAZY  (*)    All other components within normal limits  CBG MONITORING, ED - Abnormal; Notable for the following components:   Glucose-Capillary 125 (*)    All other components within normal limits  TROPONIN T, HIGH SENSITIVITY - Abnormal; Notable for the following components:   Troponin T High Sensitivity 43 (*)    All other components within normal limits  POC OCCULT BLOOD, ED  TROPONIN T, HIGH SENSITIVITY    EKG: EKG Interpretation Date/Time:  Thursday November 18 2024 12:05:51 EST Ventricular Rate:  67 PR Interval:  225 QRS Duration:  109 QT Interval:  413 QTC Calculation: 436 R Axis:   39  Text Interpretation: Sinus rhythm Prolonged PR interval Borderline T abnormalities, anterior leads no sig chnage from previous Confirmed by Armenta Canning [id number]) on 11/18/2024 12:36:35 PM  Radiology: No results found.   Procedures   Medications Ordered in the ED  sodium chloride  flush (NS) 0.9 % injection 3 mL (3 mLs Intravenous Not Given 11/18/24 1312)  0.9 %  sodium chloride  infusion ( Intravenous New Bag/Given 11/18/24 1439)                                    Medical Decision Making Amount and/or Complexity of Data Reviewed Labs: ordered.   Patient presents as outlined.  She reports generalized weakness and inability to get out of her bed today.  EMS note indicates that patient was hypotensive on arrival with first blood pressures of 72/40.  CBG 146.  Patient does not Dors any chest pain or shortness of breath.  At this time her mental status is clear and there is no focal motor deficit.  Will proceed with broad diagnostic evaluation.  Pressures corrected to 99/68 by EMS and on arrival 99/67.  Occult stool negative troponin 43 metabolic panel glucose 161 BUN 44 creatinine 3.7 GFR 14 CBC white count 6.5 hemoglobin 8.6 platelets 269 UA negative  MRI reviewed.  Patient does have some cardiomyopathy with EF around 30%.  At this time we will initiate very gentle rehydration with normal  saline at 150 an hour.  Patient's blood pressures are now greater than 100 systolic without intervention, I do feel that she needs substantial fluid boluses currently.  Also consideration for blood transfusion.  Patient is anemic at 8.6 which is about a gram per deciliter down from her baseline.  May be contributing to generalized weakness.  Rectal exam is occult negative patient does not show signs of any ongoing blood loss.  GFR is down significantly at this time.  Patient may also have dehydration or overdiuresed.  At this time we will plan for admission.  Consult: Reviewed with Dr. Celinda for admission.       Final diagnoses:  Syncope, unspecified syncope type  Generalized weakness  Anemia, unspecified type  AKI (acute kidney injury)    ED Discharge Orders     None          Armenta Canning, MD 11/18/24 1503

## 2024-11-18 NOTE — H&P (Signed)
 History and Physical    Patient: Brandi Gomez DOB: [birth year] DOA: 11/18/2024 DOS: the patient was seen and examined on 11/18/2024 PCP: Brandi Cramp, NP  Patient coming from: Home  Chief Complaint:  Chief Complaint  Patient presents with   Loss of Consciousness   HPI: Brandi Gomez is a 53 y.o. female with medical history significant of anemia, anxiety, depression, bipolar 1 disorder,, migraine, type 2 diabetes, hypertension, IBS, mild MR, class III obesity, partial complex seizures, sleep apnea, history of CVA, type 2 diabetes who was brought to the emergency department via EMS after she had blurry vision with hypotension (72/40 mmHg) at home resulting in a syncopal episode.  She stated that her vision has been blurry for the past 2 weeks.  She has postural dizziness, but no chest pain, palpitations, diaphoresis, PND, orthopnea and her lower extremities were last [place] 2 to 3 months ago.  During review of system the patient stated that her urine has been dark in the last 2 days and she only has urinated once today.  She stated she has been concerned because she has been taking her diuretics and was now producing much urine. He denied fever, chills, rhinorrhea, sore throat, wheezing or hemoptysis.  No chest pain, palpitations, diaphoresis, PND, orthopnea or pitting edema of the lower extremities.  No abdominal pain, nausea, emesis, diarrhea, constipation, melena or hematochezia.  No flank pain, dysuria, frequency or hematuria.  No polyuria, polydipsia, polyphagia or blurred vision.   Lab work: Urinalysis was straw and hazy, but otherwise unremarkable.  Fecal occult blood was negative.  CBC showed white count 6.5, hemoglobin 8.6 g/dL and platelets 730.  Her previous hemoglobin earlier this year was 9.7 g/dL.  First troponin was 43 ng/L.  CMP showed a glucose 161, BUN 44 and creatinine 3.72 mg/dL.  Her usual creatinine level is usually under 2.0 mg/dL.  The rest of the CMP  measurements were normal.  Imaging: Portable 1 view chest radiograph showing mild cardiomegaly.  No acute airspace disease, pleural effusion or pneumothorax.  ED course: Initial vital signs were temperature 97.6 F, pulse 71, respiration 19, BP 99/67 mmHg and O2 sat 97% on room air.  The patient received 125 mL of normal saline.  I added NS 1000 mL liter bolus.   Review of Systems: As mentioned in the history of present illness. All other systems reviewed and are negative. Past Medical History:  Diagnosis Date   Anemia    Anxiety    Bipolar 1 disorder (HCC)    Common migraine 05/19/2015   Depression    Diabetes mellitus, type II (HCC)    Hypertension    Irritable bowel syndrome (IBS)    Mild mental retardation    Obesity    Partial complex seizure disorder with intractable epilepsy (HCC) 05/12/2014   Seizures (HCC)    intractable, sz 08/23/17   Sleep apnea    Stroke (HCC)    Type II or unspecified type diabetes mellitus without mention of complication, not stated as uncontrolled    Past Surgical History:  Procedure Laterality Date   BUBBLE STUDY  11/15/2019   Procedure: BUBBLE STUDY;  Surgeon: Maranda Leim DEL, MD;  Location: [facility] ENDOSCOPY;  Service: Cardiovascular;;   COLONOSCOPY     2012-normal , Dr Jakie   ESOPHAGOGASTRODUODENOSCOPY     normal-Dr Jakie 2012   LOOP RECORDER INSERTION N/A 05/30/2017   Procedure: Loop Recorder Insertion;  Surgeon: Inocencio Soyla Lunger, MD;  Location: [facility] INVASIVE CV LAB;  Service: Cardiovascular;  Laterality: N/A;   LOOP RECORDER REMOVAL N/A 03/04/2018   Procedure: LOOP RECORDER REMOVAL;  Surgeon: Inocencio Soyla Lunger, MD;  Location: [facility] INVASIVE CV LAB;  Service: Cardiovascular;  Laterality: N/A;   MYRINGOTOMY WITH TUBE PLACEMENT     MYRINGOTOMY WITH TUBE PLACEMENT Right 11/05/2017   Procedure: MYRINGOTOMY WITH TUBE PLACEMENT;  Surgeon: Roark Rush, MD;  Location: [facility] OR;  Service: ENT;  Laterality: Right;  right T Tube placement   NASAL  SINUS SURGERY     RIGHT HEART CATH AND CORONARY ANGIOGRAPHY N/A 03/08/2024   Procedure: RIGHT HEART CATH AND CORONARY ANGIOGRAPHY;  Surgeon: Zenaida Morene PARAS, MD;  Location: [facility] INVASIVE CV LAB;  Service: Cardiovascular;  Laterality: N/A;   TEE WITHOUT CARDIOVERSION N/A 05/30/2017   Procedure: TRANSESOPHAGEAL ECHOCARDIOGRAM (TEE);  Surgeon: Alveta Aleene PARAS, MD;  Location: [facility] ENDOSCOPY;  Service: Cardiovascular;  Laterality: N/A;   TEE WITHOUT CARDIOVERSION N/A 11/15/2019   Procedure: TRANSESOPHAGEAL ECHOCARDIOGRAM (TEE);  Surgeon: Maranda Leim DEL, MD;  Location: [facility] ENDOSCOPY;  Service: Cardiovascular;  Laterality: N/A;   Social History:  reports that she has never smoked. She has never used smokeless tobacco. She reports that she does not drink alcohol and does not use drugs.  Allergies[1]  Family History  Problem Relation Age of Onset   Diabetes Mother        passed away from accidental death   Hypertension Mother    Bipolar disorder Father    Diabetes Daughter    Leukemia Daughter    Ovarian cancer Maternal Aunt     Prior to Admission medications  Medication Sig Start Date End Date Taking? Authorizing Provider  ACCU-CHEK GUIDE TEST test strip daily. 06/13/24   [provider]  acetaminophen  (TYLENOL ) 325 MG tablet Take 2 tablets (650 mg total) by mouth every 4 (four) hours as needed for mild pain (or temp > 37.5 C (99.5 F)). 10/19/19   Angiulli, Toribio PARAS, PA-C  ARIPiprazole  (ABILIFY ) 10 MG tablet Take 0.5 tablets (5 mg total) by mouth daily. 06/16/24   Plovsky, Elna, MD  atorvastatin  (LIPITOR) 40 MG tablet TAKE 1 TABLET (40 MG TOTAL) BY MOUTH DAILY AT 6 PM FOR CHOLESTEROL 09/24/21   Sagardia, Emil Schanz, MD  carvedilol  (COREG ) 6.25 MG tablet Take 6.25 mg by mouth 2 (two) times daily. 05/20/21   [provider]  chlorhexidine (PERIDEX) 0.12 % solution Use as directed 15 mLs in the mouth or throat daily.    [provider]  cloBAZam  (ONFI ) 10 MG tablet Take 1  tablet (10 mg total) by mouth at bedtime. 08/24/24   Gregg Lek, MD  cyanocobalamin  (VITAMIN B12) 1000 MCG tablet Take 1 tablet (1,000 mcg total) by mouth daily. 02/08/24   Bryn Bernardino NOVAK, MD  ferrous sulfate  325 (65 FE) MG tablet Take 325 mg by mouth daily with breakfast. 07/10/22   [provider]  glipiZIDE  (GLUCOTROL ) 10 MG tablet Take 10 mg by mouth 2 (two) times daily. 04/27/22   [provider]  hydrOXYzine  (VISTARIL ) 25 MG capsule 1 QHS  1 qday PRN 02/12/23   Tasia Elna, MD  lamoTRIgine  (LAMICTAL ) 200 MG tablet Take 1.5 tablets (300 mg total) by mouth 2 (two) times daily. 08/24/24   Camara, Amadou, MD  losartan  (COZAAR ) 100 MG tablet Take 1 tablet (100 mg total) by mouth daily. 08/02/24 10/31/24  Zenaida Morene PARAS, MD  Microlet Lancets MISC daily. 04/22/24   [provider]  MOUNJARO 5 MG/0.5ML Pen SMARTSIG:0.5 Milliliter(s) SUB-Q Once a  Week 04/13/24   [provider]  potassium chloride  SA (KLOR-CON  M) 20 MEQ tablet Take 1 tablet (20 mEq total) by mouth daily. 08/02/24   Zenaida Morene PARAS, MD  sitaGLIPtin  (JANUVIA ) 100 MG tablet Take 100 mg by mouth daily.    [provider]  spironolactone  (ALDACTONE ) 25 MG tablet Take 1 tablet (25 mg total) by mouth daily. 08/02/24   Zenaida Morene PARAS, MD  SYMBICORT  160-4.5 MCG/ACT inhaler SMARTSIG:2 Puff(s) By Mouth Morning-Evening 06/13/24   [provider]  torsemide  40 MG TABS Take 40 mg by mouth 2 (two) times daily. 03/08/24   Zenaida Morene PARAS, MD  Vitamin D , Ergocalciferol , (DRISDOL ) 1.25 MG (50000 UNIT) CAPS capsule Take 1 capsule (50,000 Units total) by mouth every 7 (seven) days. 07/03/23   Gayland Lauraine PARAS, NP  methylphenidate  (RITALIN ) 5 MG tablet Take 1 tablet (5 mg total) by mouth 2 (two) times daily. Patient not taking: Reported on 12/29/2019 10/29/19 01/01/20  Lorilee Sven SQUIBB, MD    Physical Exam: Vitals:   11/18/24 1230 11/18/24 1315 11/18/24 1345 11/18/24 1441  BP: 92/64 120/79 102/66    Pulse: 69 70    Resp: 10 16    Temp:      TempSrc:      SpO2: 100% 99%    Weight:    126.5 kg  Height:    5' 2 (1.575 m)   Physical Exam Vitals and nursing note reviewed.  Constitutional:      General: She is awake. She is not in acute distress.    Appearance: She is ill-appearing.  HENT:     Head: Normocephalic.     Nose: No rhinorrhea.     Mouth/Throat:     Mouth: Mucous membranes are dry.  Eyes:     General: No scleral icterus.    Pupils: Pupils are equal, round, and reactive to light.  Neck:     Vascular: No JVD.  Cardiovascular:     Rate and Rhythm: Normal rate and regular rhythm.     Heart sounds: S1 normal and S2 normal.  Pulmonary:     Effort: Pulmonary effort is normal.     Breath sounds: Normal breath sounds. No wheezing, rhonchi or rales.  Abdominal:     General: Bowel sounds are normal. There is no distension.     Palpations: Abdomen is soft.     Tenderness: There is no abdominal tenderness. There is no right CVA tenderness or left CVA tenderness.  Musculoskeletal:     Cervical back: Neck supple.     Right lower leg: No edema.     Left lower leg: No edema.  Skin:    General: Skin is warm and dry.  Neurological:     General: No focal deficit present.     Mental Status: She is alert and oriented to person, place, and time.  Psychiatric:        Mood and Affect: Mood normal.        Behavior: Behavior is cooperative.     Data Reviewed:  Results are pending, will review when available. 01/11/2024 TTE report. IMPRESSIONS:   1. Left ventricular ejection fraction, by estimation, is 30 to 35%. The  left ventricle has moderately decreased function. The left ventricle  demonstrates global hypokinesis. The left ventricular internal cavity size  was mildly dilated. Left ventricular  diastolic parameters are consistent with Grade II diastolic dysfunction  (pseudonormalization).   2. Right ventricular systolic function is normal. The right ventricular  size  is normal. Tricuspid regurgitation signal is inadequate for assessing  PA pressure.   3. Left atrial size was moderately dilated.   4. The mitral valve is normal in structure. Trivial mitral valve  regurgitation. No evidence of mitral stenosis.   5. The aortic valve is tricuspid. Aortic valve regurgitation is not  visualized. No aortic stenosis is present.   6. Aortic dilatation noted. There is borderline dilatation of the  ascending aorta, measuring 37 mm.   7. The inferior vena cava is normal in size with <50% respiratory  variability, suggesting right atrial pressure of 8 mmHg.   EKG: Vent. rate 67 BPM  PR interval 225 ms  QRS duration 109 ms  QT/QTcB 413/436 ms  P-R-T axes 42 39 94  Sinus rhythm  Prolonged PR interval  Borderline T abnormalities, anterior leads  Assessment and Plan: Principal Problem:   AKI (acute kidney injury) Superimposed on:   CKD (chronic kidney disease)  stage 3, GFR 30-59 ml/min (HCC) Observation/PCU. Continue IV fluids. Hold ARB/ACE. Hold diuretic. Avoid hypotension. Avoid nephrotoxins. Monitor intake and output. Monitor renal function electrolytes.  Active Problems:   Seizure disorder (HCC) Continue Lamictal  300 mg p.o. twice daily.    Hypertension associated with diabetes (HCC) Continue carvedilol  6.25 mg p.o. twice daily. Hold diuretics and ARB.    Controlled type 2 diabetes mellitus without complication,    without long-term current use of insulin  (HCC) Carbohydrate modified diet. CBG monitoring with RI SS. Check hemoglobin A1c. Will hold glipizide  given AKI.    OSA (obstructive sleep apnea) CPAP at bedtime.    Bipolar 1 disorder (HCC) Continue Abilify  5 mg p.o. daily. Continue clobazam  10 mg p.o. bedtime. Also on lamotrigine  300 mg p.o. twice daily.    HLD (hyperlipidemia) Continue atorvastatin  40 mg p.o. daily.    Morbid (severe) obesity due to excess calories (HCC) Current BMI 51.01 kg/m. Would benefit from  lifestyle modifications. Follow-up closely with PCP and/or bariatric clinic.    Elevated troponin No chest pain. Likely decrease renal clearance.    Chronic combined systolic and diastolic congestive heart failure (HCC) Volume depleted at this time. Resume beta-blocker in AM.    Normocytic anemia Monitor hematocrit and hemoglobin. Transfuse PRBC as needed.    Advance Care Planning:   Code Status: Full Code   Consults:   Family Communication:   Severity of Illness: The appropriate patient status for this patient is OBSERVATION. Observation status is judged to be reasonable and necessary in order to provide the required intensity of service to ensure the patient's safety. The patient's presenting symptoms, physical exam findings, and initial radiographic and laboratory data in the context of their medical condition is felt to place them at decreased risk for further clinical deterioration. Furthermore, it is anticipated that the patient will be medically stable for discharge from the hospital within 2 midnights of admission.   Author: Alm Dorn Castor, MD 11/18/2024 2:47 PM  For on call review [email or web address].   This document was prepared using Dragon voice recognition software and may contain some unintended transcription errors.     [1]  Allergies Allergen Reactions   Amoxicillin  Itching    Did it involve swelling of the face/tongue/throat, SOB, or low BP? No Did it involve sudden or severe rash/hives, skin peeling, or any reaction on the inside of your mouth or nose? No Did you need to seek medical attention at a hospital or doctor's office? No When did it last happen?      within the past  10 years If all above answers are NO, may proceed with cephalosporin use.    Lisinopril  Swelling    Angioedema    Hydrocodone  Other (See Comments)    Depressed    Tegretol  [Carbamazepine ] Swelling    Throat swells

## 2024-11-18 NOTE — ED Triage Notes (Signed)
 BIBA from home for a syncopal episode, hypotension. Pt also states she has had blurred vision for 2 weeks. Pt has left side deficit from previous CVA 99/60, 72/40 initial bp 70 hr 146 cbg 97% r/a

## 2024-11-18 NOTE — ED Notes (Addendum)
 US  PIV placed, R Forearm, 22g 2.5

## 2024-11-18 NOTE — ED Notes (Signed)
 Unsuccessful IV attempt x2.

## 2024-11-18 NOTE — Consult Note (Signed)
 WOC Nurse Consult Note: Reason for Consult: L sided moisture damage  Wound type:Intertriginous dermatitis L pannus/abdominal fold onto left groin  ICD-10 CM Codes for Irritant Dermatitis   L30.4  - Erythema intertrigo. Also used for abrasion of the hand, chafing of the skin, dermatitis due to sweating and friction, friction dermatitis, friction eczema, and genital/thigh intertrigo.  Pressure Injury POA: NA  Measurement: 8 cm x 12 cm approximately  Wound bed: erythema with scattered partial thickness skin loss r/t moisture and friction  Drainage (amount, consistency, odor) none  Periwound: Dressing procedure/placement/frequency: Cleanse L abdominal fold/pannus with soap and water , dry and apply floor stock antifungal powder (microguard white and green label) to area.  Apply Minna ARTHURS as follows: Order Gerlean # [phone] Measure and cut length of InterDry to fit in skin folds that have skin breakdown Tuck InterDry fabric into skin folds in a single layer, allow for 2 inches of overhang from skin edges to allow for wicking to occur May remove to bathe; dry area thoroughly and then tuck into affected areas again Do not apply any creams or ointments when using InterDry DO NOT THROW AWAY FOR 5 DAYS unless soiled with stool DO NOT [facility] product, this will inactivate the silver in the material  New sheet of Interdry should be applied after 5 days of use if patient continues to have skin breakdown     Will write for Gerhardt's Butt Cream 2 times daily to B groin.   POC discussed with bedside nurse. WOC team will not follow.REconsult if further needs arise.   Thank you,    Powell Bar MSN, RN-BC, [employer]

## 2024-11-19 ENCOUNTER — Encounter (HOSPITAL_COMMUNITY): Payer: Self-pay

## 2024-11-19 DIAGNOSIS — G4733 Obstructive sleep apnea (adult) (pediatric): Secondary | ICD-10-CM | POA: Diagnosis present

## 2024-11-19 DIAGNOSIS — F319 Bipolar disorder, unspecified: Secondary | ICD-10-CM | POA: Diagnosis present

## 2024-11-19 DIAGNOSIS — D649 Anemia, unspecified: Secondary | ICD-10-CM | POA: Diagnosis present

## 2024-11-19 DIAGNOSIS — E1159 Type 2 diabetes mellitus with other circulatory complications: Secondary | ICD-10-CM

## 2024-11-19 DIAGNOSIS — E119 Type 2 diabetes mellitus without complications: Secondary | ICD-10-CM | POA: Insufficient documentation

## 2024-11-19 DIAGNOSIS — I152 Hypertension secondary to endocrine disorders: Secondary | ICD-10-CM | POA: Diagnosis present

## 2024-11-19 DIAGNOSIS — I5042 Chronic combined systolic (congestive) and diastolic (congestive) heart failure: Secondary | ICD-10-CM | POA: Diagnosis present

## 2024-11-19 DIAGNOSIS — R7989 Other specified abnormal findings of blood chemistry: Secondary | ICD-10-CM | POA: Diagnosis present

## 2024-11-19 DIAGNOSIS — E785 Hyperlipidemia, unspecified: Secondary | ICD-10-CM | POA: Diagnosis present

## 2024-11-19 DIAGNOSIS — G40909 Epilepsy, unspecified, not intractable, without status epilepticus: Secondary | ICD-10-CM | POA: Insufficient documentation

## 2024-11-19 DIAGNOSIS — N1831 Chronic kidney disease, stage 3a: Secondary | ICD-10-CM | POA: Insufficient documentation

## 2024-11-19 LAB — COMPREHENSIVE METABOLIC PANEL WITH GFR
ALT: 9 U/L (ref 0–44)
AST: 15 U/L (ref 15–41)
Albumin: 3.6 g/dL (ref 3.5–5.0)
Alkaline Phosphatase: 102 U/L (ref 38–126)
Anion gap: 11 (ref 5–15)
BUN: 35 mg/dL — ABNORMAL HIGH (ref 6–20)
CO2: 21 mmol/L — ABNORMAL LOW (ref 22–32)
Calcium: 8.7 mg/dL — ABNORMAL LOW (ref 8.9–10.3)
Chloride: 107 mmol/L (ref 98–111)
Creatinine, Ser: 2.65 mg/dL — ABNORMAL HIGH (ref 0.44–1.00)
GFR, Estimated: 21 mL/min — ABNORMAL LOW (ref >60)
Glucose, Bld: 96 mg/dL (ref 70–99)
Potassium: 3.9 mmol/L (ref 3.5–5.1)
Sodium: 140 mmol/L (ref 135–145)
Total Bilirubin: 0.4 mg/dL (ref 0.0–1.2)
Total Protein: 7.1 g/dL (ref 6.5–8.1)

## 2024-11-19 LAB — CBC
HCT: 26.5 % — ABNORMAL LOW (ref 36.0–46.0)
Hemoglobin: 8.2 g/dL — ABNORMAL LOW (ref 12.0–15.0)
MCH: 29.5 pg (ref 26.0–34.0)
MCHC: 30.9 g/dL (ref 30.0–36.0)
MCV: 95.3 fL (ref 80.0–100.0)
Platelets: 272 K/uL (ref 150–400)
RBC: 2.78 MIL/uL — ABNORMAL LOW (ref 3.87–5.11)
RDW: 15.5 % (ref 11.5–15.5)
WBC: 6.5 K/uL (ref 4.0–10.5)
nRBC: 0 % (ref 0.0–0.2)

## 2024-11-19 LAB — VITAMIN B12: Vitamin B-12: 740 pg/mL (ref 180–914)

## 2024-11-19 LAB — GLUCOSE, CAPILLARY
Glucose-Capillary: 98 mg/dL (ref 70–99)
Glucose-Capillary: 104 mg/dL — ABNORMAL HIGH (ref 70–99)
Glucose-Capillary: 99 mg/dL (ref 70–99)
Glucose-Capillary: 121 mg/dL — ABNORMAL HIGH (ref 70–99)

## 2024-11-19 LAB — FERRITIN: Ferritin: 158 ng/mL (ref 11–307)

## 2024-11-19 LAB — RETICULOCYTES
Immature Retic Fract: 8.7 % (ref 2.3–15.9)
RBC.: 2.77 MIL/uL — ABNORMAL LOW (ref 3.87–5.11)
Retic Count, Absolute: 33 K/uL (ref 19.0–186.0)
Retic Ct Pct: 1.2 % (ref 0.4–3.1)

## 2024-11-19 LAB — FOLATE: Folate: 4.6 ng/mL — ABNORMAL LOW (ref >5.9)

## 2024-11-19 LAB — IRON AND TIBC
Iron: 56 ug/dL (ref 28–170)
Saturation Ratios: 24 % (ref 10.4–31.8)
TIBC: 239 ug/dL — ABNORMAL LOW (ref 250–450)
UIBC: 183 ug/dL

## 2024-11-19 MED ORDER — FOLIC ACID 1 MG PO TABS
1.0000 mg | ORAL_TABLET | Freq: Every day | ORAL | Status: DC
  Administered 2024-11-19 – 2024-11-21 (×3): 1 mg via ORAL
  Filled 2024-11-19 (×3): qty 1

## 2024-11-19 NOTE — Plan of Care (Signed)
  Problem: Health Behavior/Discharge Planning: Goal: Ability to identify and utilize available resources and services will improve Outcome: Progressing Goal: Ability to manage health-related needs will improve Outcome: Progressing   Problem: Fluid Volume: Goal: Ability to maintain a balanced intake and output will improve Outcome: Progressing   Problem: Coping: Goal: Ability to adjust to condition or change in health will improve Outcome: Progressing

## 2024-11-19 NOTE — Plan of Care (Signed)

## 2024-11-19 NOTE — Progress Notes (Signed)
 PROGRESS NOTE  Brandi Gomez FMW:[id number] DOB: Oct 30, 1971   PCP: Nguyen, Kim, NP  Patient is from: Home.  Lives with friend and daughter.  Independently ambulates at baseline.  DOA: 11/18/2024 LOS: 0  Chief complaints Chief Complaint  Patient presents with   Loss of Consciousness     Brief Narrative / Interim history: 53 year old F with PMH of combined CHF, CVA, CKD-3A, DM-2, OSA on CPAP, morbid obesity, memory loss, anxiety, depression, bipolar disorder, memory loss, anemia and seizure disorder presenting with intermittent blurry vision for about 2 weeks, low BP to 72/40, postural dizziness, syncope and decreased urine output, and admitted with AKI.  Patient was on diuretics and multiple antihypertensive meds for CHF.   In ED, stable vitals.  BP 99/67.  Cr 3.72 (was 1.54 in 04/2024).  Hgb 8.6.  Hemoccult negative.  CXR without acute finding.  Patient was started on IV fluid and admitted for further care.   Subjective: Seen and examined earlier this morning.  No major events overnight or this morning.  No complaints this morning.  No further blurry vision.  Denies chest pain, dyspnea, orthopnea, palpitation, dizziness, GI or UTI symptoms.  Assessment and plan: AKI on CKD-3A: b/l Cr 1.4-1.5 but no recent value.  AKI likely due to hypotension, diuretics and ARB.  Improved with IV fluid. Recent Labs    02/05/24 0309 02/06/24 0240 02/07/24 0800 02/08/24 0828 02/22/24 0748 03/01/24 1502 05/06/24 1230 11/18/24 1408 11/18/24 1923 11/19/24 0410  BUN 12 20 22* 24* 29* 17 15 44* 41* 35*  CREATININE 1.44* 1.67* 1.75* 1.65* 1.74* 1.43* 1.54* 3.72* 3.07* 2.65*  - Will monitor off IV fluid given CHF - Continue holding diuretics and ARB - Avoid hypotension - Recheck in the morning.  Orthostasis/hypotension: Likely iatrogenic from medications.  Reports home BP of 72/40.  Had blurry vision for about 2 weeks likely due to low BP.  Patient does not recall the details.  Per patient's  friend, she did not pass out but has had postural dizziness, blurry vision and low blood pressure. - Holding diuretics and ARB - Check orthostatic vitals - Update echocardiogram - Fall precaution, PT and OT  Chronic combined CHF/moderate PAH: TTE in 01/2024 with LVEF of 30 to 35%, G2-DD.  RHC in 02/2024 with severely elevated left and right-sided filling pressures, moderate PAH and normal cardiac output.  No cardiopulmonary symptoms.  Difficult to assess fluid status due to body habitus.  Followed by Dr. Zenaida with advanced heart failure team. -Continue holding diuretics and ARB in the setting of AKI -Continue low-dose Coreg . -Update echocardiogram. -Strict intake and output, daily weight, renal functions and electrolytes  Anemia of renal disease: Baseline Hgb ranges from 9-8 but no recent value.  Slight drop likely hemodilution.  Denies melena or hematochezia.  She takes full dose aspirin  as needed for headache.  Recent Labs    02/04/24 0254 02/22/24 0748 03/01/24 1502 03/08/24 0923 03/08/24 0924 03/08/24 0928 05/06/24 1230 11/18/24 1408 11/18/24 1923 11/19/24 0410  HGB 10.4* 10.8* 10.6* 10.5* 10.5* 9.9* 9.7* 8.6* 8.0* 8.2*  - Advised to avoid NSAID. - Check anemia panel -Transfuse for Hgb <7.0 or symptomatic anemia.  No history of CAD.  Verbally consented for transfusion if needed  NIDDM-2 with hyperglycemia: A1c 5.9%.  On glipizide  10 mg daily at home. Recent Labs  Lab 11/18/24 1233 11/18/24 1657 11/18/24 2137 11/19/24 0731 11/19/24 1121  GLUCAP 125* 103* 96 98 104*  - Continue SSI-sensitive  OSA on CPAP: Reports using CPAP at  home - Continue CPAP at night  Anxiety, depression and bipolar disorder: Stable -Continue home Abilify , clobazam  and Lamictal .  Elevated troponin: Mild.  No chest pain.  EKG without acute finding. - Update echocardiogram  Blurry vision: likely due due to hypotension.  Resolved.  Memory loss: Patient reports this.  She is oriented x 4 with  the help of wall calendar. - Reorientation and delirium precaution  Hyperlipidemia -Continue home statin.   Morbid obesity Body mass index is 51.01 kg/m.           DVT prophylaxis:  SCDs Start: 11/18/24 1530  Code Status: Full code Family Communication: Updated patient's friend at patient's request. Level of care: Progressive Status is: Observation The patient will require care spanning > 2 midnights and should be moved to inpatient because: AKI, orthostasis, hypotension   Final disposition: Home   55 minutes with more than 50% spent in reviewing records, counseling patient/family and coordinating care.  Consultants:  None  Procedures: None  Microbiology summarized: None  Objective: Vitals:   11/18/24 1828 11/18/24 2139 11/19/24 0533 11/19/24 1341  BP: 108/63 111/60 98/61 126/82  Pulse: 72 65 76 81  Resp: 20 19 19 16   Temp: 97.9 F (36.6 C) (!) 97.5 F (36.4 C) 97.9 F (36.6 C) 98.5 F (36.9 C)  TempSrc: Oral Oral Oral Oral  SpO2: 95% 100% 99% 98%  Weight:      Height:        Examination:  GENERAL: No apparent distress.  Nontoxic. HEENT: MMM.  Vision and hearing grossly intact.  NECK: Supple.  No apparent JVD.  RESP:  No IWOB.  Fair aeration bilaterally. CVS:  RRR. Heart sounds normal.  ABD/GI/GU: BS+. Abd soft, NTND.  MSK/EXT:  Moves extremities. No apparent deformity. No edema.  SKIN: no apparent skin lesion or wound NEURO: AA.  Oriented appropriately.  No apparent focal neuro deficit. PSYCH: Calm. Normal affect.   Sch Meds:  Scheduled Meds:  ARIPiprazole   5 mg Oral Daily   atorvastatin   40 mg Oral q1800   carvedilol   6.25 mg Oral BID   cloBAZam   10 mg Oral QHS   Gerhardt's butt cream   Topical BID   insulin  aspart  0-9 Units Subcutaneous TID WC   lamoTRIgine   300 mg Oral BID   sodium chloride  flush  3 mL Intravenous Once   Continuous Infusions: PRN Meds:.acetaminophen  **OR** acetaminophen , ondansetron  **OR** ondansetron  (ZOFRAN )  IV  Antimicrobials: Anti-infectives (From admission, onward)    None        I have personally reviewed the following labs and images: CBC: Recent Labs  Lab 11/18/24 1408 11/18/24 1923 11/19/24 0410  WBC 6.5  --  6.5  HGB 8.6* 8.0* 8.2*  HCT 28.6* 25.5* 26.5*  MCV 95.7  --  95.3  PLT 269  --  272   BMP &GFR Recent Labs  Lab 11/18/24 1408 11/18/24 1923 11/19/24 0410  NA 137 137 140  K 4.4 4.2 3.9  CL 102 105 107  CO2 24 22 21*  GLUCOSE 161* 139* 96  BUN 44* 41* 35*  CREATININE 3.72* 3.07* 2.65*  CALCIUM  9.0 8.5* 8.7*   Estimated Creatinine Clearance: 30.6 mL/min (A) (by C-G formula based on SCr of 2.65 mg/dL (H)). Liver & Pancreas: Recent Labs  Lab 11/18/24 1408 11/19/24 0410  AST 18 15  ALT 11 9  ALKPHOS 115 102  BILITOT 0.3 0.4  PROT 7.6 7.1  ALBUMIN 3.9 3.6   No results for input(s): LIPASE, AMYLASE in  the last 168 hours. No results for input(s): AMMONIA in the last 168 hours. Diabetic: Recent Labs    11/18/24 1408  HGBA1C 5.9*   Recent Labs  Lab 11/18/24 1233 11/18/24 1657 11/18/24 2137 11/19/24 0731 11/19/24 1121  GLUCAP 125* 103* 96 98 104*   Cardiac Enzymes: No results for input(s): CKTOTAL, CKMB, CKMBINDEX, TROPONINI in the last 168 hours. No results for input(s): PROBNP in the last 8760 hours. Coagulation Profile: No results for input(s): INR, PROTIME in the last 168 hours. Thyroid  Function Tests: No results for input(s): TSH, T4TOTAL, FREET4, T3FREE, THYROIDAB in the last 72 hours. Lipid Profile: No results for input(s): CHOL, HDL, LDLCALC, TRIG, CHOLHDL, LDLDIRECT in the last 72 hours. Anemia Panel: Recent Labs    11/19/24 1052  RETICCTPCT 1.2   Urine analysis:    Component Value Date/Time   COLORURINE STRAW (A) 11/18/2024 1344   APPEARANCEUR HAZY (A) 11/18/2024 1344   LABSPEC 1.008 11/18/2024 1344   PHURINE 5.0 11/18/2024 1344   GLUCOSEU NEGATIVE 11/18/2024 1344   HGBUR  NEGATIVE 11/18/2024 1344   BILIRUBINUR NEGATIVE 11/18/2024 1344   BILIRUBINUR negative 01/18/2021 1621   BILIRUBINUR negative 07/01/2015 1108   KETONESUR NEGATIVE 11/18/2024 1344   PROTEINUR NEGATIVE 11/18/2024 1344   UROBILINOGEN 1.0 09/11/2021 1533   NITRITE NEGATIVE 11/18/2024 1344   LEUKOCYTESUR NEGATIVE 11/18/2024 1344   Sepsis Labs: Invalid input(s): PROCALCITONIN, LACTICIDVEN  Microbiology: No results found for this or any previous visit (from the past 240 hours).  Radiology Studies: DG Chest Port 1 View Result Date: 11/18/2024 CLINICAL DATA:  Syncope hypotension EXAM: PORTABLE CHEST 1 VIEW COMPARISON:  02/22/2024 FINDINGS: Mild cardiomegaly. No acute airspace disease, pleural effusion or pneumothorax. IMPRESSION: No active disease. Mild cardiomegaly. Electronically Signed   By: Luke Bun M.D.   On: 11/18/2024 15:40      Cherese Lozano T. Raeanna Soberanes [facility]  If 7PM-7AM, please contact night-coverage [email or web address] 11/19/2024, 2:06 PM

## 2024-11-19 NOTE — Evaluation (Signed)
 Physical Therapy Evaluation Patient Details Name: Brandi Gomez MRN: [id number] DOB: [birth year] Today's Date: 11/19/2024  History of Present Illness  53 y.o. female who was brought to the emergency department via EMS after she had blurry vision with hypotension (72/40 mmHg) at home resulting in a syncopal episode. Dx of AKI on CKD. Pt with medical history significant of anemia, anxiety, depression, bipolar 1 disorder,, migraine, type 2 diabetes, hypertension, IBS, mild MR, class III obesity, partial complex seizures, sleep apnea, history of CVA, type 2 diabetes  Clinical Impression  Pt admitted with above diagnosis. Pt reports she ambulates without an assistive device at baseline. She reports h/o 2 falls in past 6 months but could not provide details around circumstances of falls. Pt ambulated 60' with RW, no loss of balance, pt denied dizziness with activity.  Pt currently with functional limitations due to the deficits listed below (see PT Problem List). Pt will benefit from acute skilled PT to increase their independence and safety with mobility to allow discharge.           If plan is discharge home, recommend the following: A little help with bathing/dressing/bathroom;Assistance with cooking/housework;Assist for transportation;Help with stairs or ramp for entrance   Can travel by private vehicle        Equipment Recommendations None recommended by PT  Recommendations for Other Services       Functional Status Assessment Patient has had a recent decline in their functional status and demonstrates the ability to make significant improvements in function in a reasonable and predictable amount of time.     Precautions / Restrictions Precautions Precautions: Fall Recall of Precautions/Restrictions: Intact Precaution/Restrictions Comments: 2 falls in past 6 months Restrictions Weight Bearing Restrictions Per Provider Order: No      Mobility  Bed Mobility Overal bed mobility:  Modified Independent             General bed mobility comments: HOB up, used rail; attempted 3x to call telemetry prior to mobility but got a busy signal every time    Transfers Overall transfer level: Needs assistance Equipment used: Rolling walker (2 wheels) Transfers: Sit to/from Stand Sit to Stand: Supervision           General transfer comment: used momentum for sit to stand    Ambulation/Gait Ambulation/Gait assistance: Contact guard assist Gait Distance (Feet): 90 Feet Assistive device: Rolling walker (2 wheels) Gait Pattern/deviations: Step-through pattern, Decreased stride length, Trunk flexed Gait velocity: WFL     General Gait Details: steady, no loss of balance, VCs for proximity to [employer] Mobility     Tilt Bed    Modified Rankin (Stroke Patients Only)       Balance Overall balance assessment: History of Falls, Needs assistance Sitting-balance support: Feet supported, No upper extremity supported Sitting balance-Leahy Scale: Good       Standing balance-Leahy Scale: Fair                               Pertinent Vitals/Pain Pain Assessment Pain Assessment: No/denies pain Breathing: normal Negative Vocalization: none Facial Expression: smiling or inexpressive Body Language: relaxed Consolability: no need to console PAINAD Score: 0    Home Living Family/patient expects to be discharged to:: Private residence Living Arrangements: Children;Non-relatives/Friends Available Help at Discharge: Family;Available PRN/intermittently Type of Home: House Home Access: Stairs to enter Entrance Stairs-Rails: Left Entrance  Stairs-Number of Steps: 5   Home Layout: One level Home Equipment: Rollator (4 wheels) Additional Comments: lives with daughter and a friend    Prior Function Prior Level of Function : Needs assist             Mobility Comments: Independent without AD; reports 2 falls in past 6  months ADLs Comments: sponge bathes/dresses, friend helps wash her back     Extremity/Trunk Assessment   Upper Extremity Assessment Upper Extremity Assessment: Defer to OT evaluation    Lower Extremity Assessment Lower Extremity Assessment: Overall WFL for tasks assessed    Cervical / Trunk Assessment Cervical / Trunk Assessment: Normal  Communication   Communication Communication: Impaired Factors Affecting Communication: Reduced clarity of speech    Cognition Arousal: Alert Behavior During Therapy: WFL for tasks assessed/performed   PT - Cognitive impairments: No apparent impairments                         Following commands: Intact       Cueing       General Comments      Exercises     Assessment/Plan    PT Assessment Patient needs continued PT services  PT Problem List Decreased activity tolerance;Decreased mobility       PT Treatment Interventions Gait training;Therapeutic exercise;Functional mobility training;Therapeutic activities;Patient/family education    PT Goals (Current goals can be found in the Care Plan section)  Acute Rehab PT Goals Patient Stated Goal: likes to watch soap operas PT Goal Formulation: With patient Time For Goal Achievement: 12/03/24 Potential to Achieve Goals: Good    Frequency Min 3X/week     Co-evaluation               AM-PAC PT 6 Clicks Mobility  Outcome Measure Help needed turning from your back to your side while in a flat bed without using bedrails?: None Help needed moving from lying on your back to sitting on the side of a flat bed without using bedrails?: A Little Help needed moving to and from a bed to a chair (including a wheelchair)?: A Little Help needed standing up from a chair using your arms (e.g., wheelchair or bedside chair)?: A Little Help needed to walk in hospital room?: None Help needed climbing 3-5 steps with a railing? : A Little 6 Click Score: 20    End of Session  Equipment Utilized During Treatment: Gait belt Activity Tolerance: Patient tolerated treatment well Patient left: in chair;with chair alarm set;with call bell/phone within reach Nurse Communication: Mobility status PT Visit Diagnosis: Difficulty in walking, not elsewhere classified (R26.2)    Time: 8651-8586 PT Time Calculation (min) (ACUTE ONLY): 25 min   Charges:   PT Evaluation $PT Eval Moderate Complexity: 1 Mod PT Treatments $Gait Training: 8-22 mins PT General Charges $$ ACUTE PT VISIT: 1 Visit        Sylvan Delon Copp PT 11/19/2024  Acute Rehabilitation Services  Office [phone]

## 2024-11-19 NOTE — Evaluation (Signed)
 Occupational Therapy Evaluation Patient Details Name: Brandi Gomez MRN: [id number] DOB: 20-Nov-1971 Today's Date: 11/19/2024   History of Present Illness   53 yr old female who was brought to the emergency department via EMS after she had blurry vision with hypotension at home resulting in a syncopal episode. She was found to have AKI on CKD. Pt with medical history significant of anemia, anxiety, depression, bipolar 1 disorder,, migraine, type 2 diabetes, hypertension, IBS, mild MR, class III obesity, partial complex seizures, sleep apnea, history of CVA, type 2 diabetes     Clinical Impressions During the session today, the pt performed all assessed tasks with distant supervision or better, including sit to stand, functional ambulation using a RW, upper body grooming standing at the sink and simulated dressing. She appears to be very near to her baseline level of functioning for self-care management. She does not require further OT services. OT will sign off and recommend she return home at discharge.      If plan is discharge home, recommend the following:   Help with stairs or ramp for entrance;Assist for transportation;Assistance with cooking/housework     Functional Status Assessment   Patient has not had a recent decline in their functional status     Equipment Recommendations   None recommended by OT     Recommendations for Other Services         Precautions/Restrictions   Restrictions Weight Bearing Restrictions Per Provider Order: No     Mobility Bed Mobility Overal bed mobility: Modified Independent Bed Mobility: Sit to Supine       Sit to supine: Modified independent (Device/Increase time)        Transfers Overall transfer level: Needs assistance Equipment used: None Transfers: Sit to/from Stand Sit to Stand: Supervision           Balance     Sitting balance-Leahy Scale: Good       Standing balance-Leahy Scale: Fair        ADL either performed or assessed with clinical judgement   ADL Overall ADL's : At baseline      General ADL Comments: The pt completed all assessed ADLs with distant supervision or better. She appears to be near to her baseline level of functioning for self-care management.     Vision   Additional Comments: She correctly read the time depicted on the wall clock.            Pertinent Vitals/Pain Pain Assessment Pain Assessment: No/denies pain     Extremity/Trunk Assessment Upper Extremity Assessment Upper Extremity Assessment: Overall WFL for tasks assessed;LUE deficits/detail;RUE deficits/detail;Right hand dominant RUE Deficits / Details: AROM WFL. Grip strength 4+/5 LUE Deficits / Details: AROM WFL. Grip strength 4+/5   Lower Extremity Assessment Lower Extremity Assessment: Overall WFL for tasks assessed;RLE deficits/detail;LLE deficits/detail RLE Deficits / Details: AROM WFL LLE Deficits / Details: AROM WFL      Communication Communication Communication: No apparent difficulties Factors Affecting Communication: Reduced clarity of speech   Cognition Arousal: Alert Behavior During Therapy: WFL for tasks assessed/performed      OT - Cognition Comments: Oriented x4        Following commands: Intact       Cueing  General Comments   Cueing Techniques: Verbal cues              Home Living Family/patient expects to be discharged to:: Private residence Living Arrangements: Children;Non-relatives/Friends (daughter and friend) Available Help at Discharge: Family Type of Home: House Home  Access: Stairs to enter [employer] of Steps:  (4) Entrance Stairs-Rails: Left Home Layout: One level     Bathroom Shower/Tub: Chief Strategy Officer: Standard     Home Equipment: Shower seat;BSC/3in1 (4 wheeled walker without a seat)   Additional Comments: lives with daughter and a friend      Prior Functioning/Environment Prior Level of  Function : Needs assist             Mobility Comments: Independent without AD; reports 2 falls in past 6 months ADLs Comments:  (She was modified independent to independent with ADLs, and her friend managed the cooking, cleaning, and driving. Sbe stated, I take alcohol baths.)    OT Problem List: Other (comment) (N/A)   OT Treatment/Interventions:   N/A     OT Goals(Current goals can be found in the care plan section)   Acute Rehab OT Goals OT Goal Formulation: All assessment and education complete, DC therapy   OT Frequency:   N/A       AM-PAC OT 6 Clicks Daily Activity     Outcome Measure Help from another person eating meals?: None Help from another person taking care of personal grooming?: None Help from another person toileting, which includes using toliet, bedpan, or urinal?: None Help from another person bathing (including washing, rinsing, drying)?: None Help from another person to put on and taking off regular upper body clothing?: None Help from another person to put on and taking off regular lower body clothing?: None 6 Click Score: 24   End of Session Equipment Utilized During Treatment: Gait belt;Rolling walker (2 wheels) Nurse Communication: Mobility status  Activity Tolerance: Patient tolerated treatment well Patient left: in bed;with call bell/phone within reach;with bed alarm set  OT Visit Diagnosis: History of falling (Z91.81)                Time: 8491-8473 OT Time Calculation (min): 18 min Charges:  OT General Charges $OT Visit: 1 Visit OT Evaluation $OT Eval Low Complexity: 1 Low    Delanna JINNY Lesches, OTR/L 11/19/2024, 3:46 PM

## 2024-11-20 ENCOUNTER — Observation Stay (HOSPITAL_COMMUNITY)

## 2024-11-20 LAB — ECHOCARDIOGRAM COMPLETE
Height: 62 in
Weight: 4462.11 [oz_av]

## 2024-11-20 LAB — RENAL FUNCTION PANEL
Albumin: 3.5 g/dL (ref 3.5–5.0)
Anion gap: 16 — ABNORMAL HIGH (ref 5–15)
BUN: 26 mg/dL — ABNORMAL HIGH (ref 6–20)
CO2: 18 mmol/L — ABNORMAL LOW (ref 22–32)
Calcium: 8.8 mg/dL — ABNORMAL LOW (ref 8.9–10.3)
Chloride: 109 mmol/L (ref 98–111)
Creatinine, Ser: 2.27 mg/dL — ABNORMAL HIGH (ref 0.44–1.00)
GFR, Estimated: 25 mL/min — ABNORMAL LOW (ref >60)
Glucose, Bld: 90 mg/dL (ref 70–99)
Phosphorus: 3.2 mg/dL (ref 2.5–4.6)
Potassium: 4.1 mmol/L (ref 3.5–5.1)
Sodium: 144 mmol/L (ref 135–145)

## 2024-11-20 LAB — GLUCOSE, CAPILLARY
Glucose-Capillary: 79 mg/dL (ref 70–99)
Glucose-Capillary: 81 mg/dL (ref 70–99)
Glucose-Capillary: 116 mg/dL — ABNORMAL HIGH (ref 70–99)
Glucose-Capillary: 125 mg/dL — ABNORMAL HIGH (ref 70–99)

## 2024-11-20 LAB — CBC
HCT: 24.3 % — ABNORMAL LOW (ref 36.0–46.0)
Hemoglobin: 7.5 g/dL — ABNORMAL LOW (ref 12.0–15.0)
MCH: 29.9 pg (ref 26.0–34.0)
MCHC: 30.9 g/dL (ref 30.0–36.0)
MCV: 96.8 fL (ref 80.0–100.0)
Platelets: 249 K/uL (ref 150–400)
RBC: 2.51 MIL/uL — ABNORMAL LOW (ref 3.87–5.11)
RDW: 15.9 % — ABNORMAL HIGH (ref 11.5–15.5)
WBC: 6.1 K/uL (ref 4.0–10.5)
nRBC: 0 % (ref 0.0–0.2)

## 2024-11-20 LAB — PREPARE RBC (CROSSMATCH)

## 2024-11-20 LAB — MAGNESIUM: Magnesium: 2.2 mg/dL (ref 1.7–2.4)

## 2024-11-20 MED ORDER — POLYETHYLENE GLYCOL 3350 17 G PO PACK: 17.0000 g | PACK | Freq: Two times a day (BID) | ORAL | Status: DC | PRN

## 2024-11-20 MED ORDER — POLYETHYLENE GLYCOL 3350 17 G PO PACK
17.0000 g | PACK | Freq: Two times a day (BID) | ORAL | Status: DC
  Administered 2024-11-20: 17 g via ORAL
  Filled 2024-11-20 (×2): qty 1

## 2024-11-20 MED ORDER — SENNOSIDES-DOCUSATE SODIUM 8.6-50 MG PO TABS: 1.0000 | ORAL_TABLET | Freq: Two times a day (BID) | ORAL | Status: DC | PRN

## 2024-11-20 MED ORDER — PERFLUTREN LIPID MICROSPHERE
1.0000 mL | INTRAVENOUS | Status: AC | PRN
  Administered 2024-11-20: 2 mL via INTRAVENOUS

## 2024-11-20 MED ORDER — CARVEDILOL 3.125 MG PO TABS
3.1250 mg | ORAL_TABLET | Freq: Two times a day (BID) | ORAL | Status: DC
  Administered 2024-11-20 – 2024-11-21 (×3): 3.125 mg via ORAL
  Filled 2024-11-20 (×3): qty 1

## 2024-11-20 MED ORDER — SENNOSIDES-DOCUSATE SODIUM 8.6-50 MG PO TABS
2.0000 | ORAL_TABLET | Freq: Two times a day (BID) | ORAL | Status: DC
  Administered 2024-11-20: 2 via ORAL
  Filled 2024-11-20 (×2): qty 2

## 2024-11-20 MED ORDER — SODIUM CHLORIDE 0.9% IV SOLUTION: Freq: Once | INTRAVENOUS | Status: AC

## 2024-11-20 NOTE — Plan of Care (Signed)

## 2024-11-20 NOTE — Progress Notes (Signed)
 Echocardiogram 2D Echocardiogram has been performed with echo imagining agent (definity ).  Misako Roeder N Molly Maselli,RDCS 11/20/2024, 1:42 PM

## 2024-11-20 NOTE — Progress Notes (Signed)
°   11/20/24 2304  BiPAP/CPAP/SIPAP  BiPAP/CPAP/SIPAP Pt Type Adult  BiPAP/CPAP/SIPAP DREAMSTATIOND  Mask Type Full face mask  Dentures removed? Not applicable  Mask Size Medium  FiO2 (%) 21 %  Patient Home Machine No  Patient Home Mask No  Patient Home Tubing No  Auto Titrate Yes  Minimum cmH2O 5 cmH2O  Maximum cmH2O 20 cmH2O  Device Plugged into RED Power Outlet Yes  BiPAP/CPAP /SiPAP Vitals  Resp (!) 26  MEWS Score/Color  MEWS Score 2  MEWS Score Color Yellow

## 2024-11-20 NOTE — Care Management Obs Status (Signed)
 MEDICARE OBSERVATION STATUS NOTIFICATION   Patient Details  Name: Brandi Gomez MRN: [id number] Date of Birth: 10/29/1971   Medicare Observation Status Notification Given:  Yes    Sonda Manuella Quill, RN 11/20/2024, 5:55 PM

## 2024-11-20 NOTE — Progress Notes (Signed)
 PROGRESS NOTE  Brandi Gomez FMW:[id number] DOB: 07-11-1971   PCP: Nguyen, Kim, NP  Patient is from: Home.  Lives with friend and daughter.  Independently ambulates at baseline.  DOA: 11/18/2024 LOS: 0  Chief complaints Chief Complaint  Patient presents with   Loss of Consciousness     Brief Narrative / Interim history: 53 year old F with PMH of combined CHF, CVA, CKD-3A, DM-2, OSA on CPAP, morbid obesity, memory loss, anxiety, depression, bipolar disorder, memory loss, anemia and seizure disorder presenting with intermittent blurry vision for about 2 weeks, low BP to 72/40, postural dizziness, syncope and decreased urine output, and admitted with AKI.  Patient was on diuretics and multiple antihypertensive meds for CHF.   In ED, stable vitals.  BP 99/67.  Cr 3.72 (was 1.54 in 04/2024).  Hgb 8.6.  Hemoccult negative.  CXR without acute finding.  Patient was started on IV fluid and admitted for further care.  Blood pressure and AKI improved.  Hemoglobin dropped without overt bleeding.  Transfusing 1 unit.   Subjective: Seen and examined earlier this morning.  No major events overnight or this morning.  No complaints this morning.  Denies blurry vision, chest pain, dyspnea, orthopnea, palpitation, dizziness, GI or UTI symptoms.  Denies melena or hematochezia.  She states last bowel movement was 3 to 4 days.  Assessment and plan: AKI on CKD-3A: b/l Cr 1.4-1.5 but no recent value.  AKI likely due to hypotension, diuretics and ARB.  Improving. Recent Labs    02/06/24 0240 02/07/24 0800 02/08/24 0828 02/22/24 0748 03/01/24 1502 05/06/24 1230 11/18/24 1408 11/18/24 1923 11/19/24 0410 11/20/24 0349  BUN 20 22* 24* 29* 17 15 44* 41* 35* 26*  CREATININE 1.67* 1.75* 1.65* 1.74* 1.43* 1.54* 3.72* 3.07* 2.65* 2.27*  - Continue monitoring of IV fluid - Continue holding diuretics and ARB - Avoid hypotension - Recheck in the morning.  Orthostasis/hypotension: Likely iatrogenic  from medications.  Reports home BP of 72/40.  Orthostatic vitals negative.  She is also anemic. -Continue holding diuretics and ARB -Decrease Coreg . -Follow echocardiogram. -Blood transfusion - Fall precaution, PT and OT  Chronic combined CHF/moderate PAH: TTE in 01/2024 with LVEF of 30 to 35%, G2-DD.  RHC in 02/2024 with severely elevated left and right-sided filling pressures, moderate PAH and normal cardiac output.  No cardiopulmonary symptoms.  Difficult to assess fluid status due to body habitus.  Followed by Dr. Zenaida with advanced heart failure team. -Continue holding diuretics and ARB in the setting of AKI -Decrease Coreg  to 3.125 mg twice daily given soft BP - Follow echocardiogram -Strict intake and output, daily weight, renal functions and electrolytes  Anemia of renal disease: b/l Hgb 8-9.  Slight drop likely hemodilution.  Denies melena or hematochezia.  She takes full dose aspirin  as needed for headache.  Anemia panel with low folic acid . Recent Labs    02/22/24 0748 03/01/24 1502 03/08/24 0923 03/08/24 0924 03/08/24 0928 05/06/24 1230 11/18/24 1408 11/18/24 1923 11/19/24 0410 11/20/24 0349  HGB 10.8* 10.6* 10.5* 10.5* 9.9* 9.7* 8.6* 8.0* 8.2* 7.5*  -Advised to avoid NSAID. -Transfuse 1 unit -Folic acid  supplementation -Check Hemoccult  NIDDM-2 with hyperglycemia: A1c 5.9%.  On glipizide  10 mg daily at home. Recent Labs  Lab 11/19/24 1121 11/19/24 1641 11/19/24 2145 11/20/24 0720 11/20/24 1141  GLUCAP 104* 99 121* 79 81  - Continue SSI-sensitive  OSA on CPAP: Reports using CPAP at home - Continue CPAP at night  Anxiety, depression and bipolar disorder: Stable -Continue home Abilify ,  clobazam  and Lamictal .  Elevated troponin: Mild.  No chest pain.  EKG without acute finding. - Update echocardiogram  Blurry vision: likely due due to hypotension.  Resolved.  Memory loss: Patient reports this.  She is oriented x 4 with the help of wall calendar. -  Reorientation and delirium precaution  Hyperlipidemia -Continue home statin.   Morbid obesity Body mass index is 51.01 kg/m.           DVT prophylaxis:  SCDs Start: 11/18/24 1530  Code Status: Full code Family Communication: Updated patient's daughter over the phone. Level of care: Progressive Status is: Observation The patient will require care spanning > 2 midnights and should be moved to inpatient because: AKI, orthostasis, hypotension and anemia   Final disposition: Home   55 minutes with more than 50% spent in reviewing records, counseling patient/family and coordinating care.  Consultants:  None  Procedures: None  Microbiology summarized: None  Objective: Vitals:   11/20/24 0559 11/20/24 1039 11/20/24 1107 11/20/24 1430  BP: (!) 110/50 106/73 (!) 94/52 (!) 114/40  Pulse: 66 65 62 74  Resp: 20 14 15 16   Temp: 99 F (37.2 C) 98.2 F (36.8 C) 98.4 F (36.9 C) 97.9 F (36.6 C)  TempSrc:  Oral Oral Oral  SpO2: 98% 97% 96% 96%  Weight:      Height:        Examination:  GENERAL: No apparent distress.  Nontoxic. HEENT: MMM.  Vision and hearing grossly intact.  NECK: Supple.  No apparent JVD.  RESP:  No IWOB.  Fair aeration bilaterally. CVS:  RRR. Heart sounds normal.  ABD/GI/GU: BS+. Abd soft, NTND.  MSK/EXT:  Moves extremities. No apparent deformity. No edema.  SKIN: no apparent skin lesion or wound NEURO: AA.  Oriented appropriately.  No apparent focal neuro deficit. PSYCH: Calm. Normal affect.   Sch Meds:  Scheduled Meds:  ARIPiprazole   5 mg Oral Daily   atorvastatin   40 mg Oral q1800   carvedilol   3.125 mg Oral BID   cloBAZam   10 mg Oral QHS   folic acid   1 mg Oral Daily   Gerhardt's butt cream   Topical BID   insulin  aspart  0-9 Units Subcutaneous TID WC   lamoTRIgine   300 mg Oral BID   sodium chloride  flush  3 mL Intravenous Once   Continuous Infusions: PRN Meds:.acetaminophen  **OR** acetaminophen , ondansetron  **OR** ondansetron   (ZOFRAN ) IV, perflutren  lipid microspheres (DEFINITY ) IV suspension  Antimicrobials: Anti-infectives (From admission, onward)    None        I have personally reviewed the following labs and images: CBC: Recent Labs  Lab 11/18/24 1408 11/18/24 1923 11/19/24 0410 11/20/24 0349  WBC 6.5  --  6.5 6.1  HGB 8.6* 8.0* 8.2* 7.5*  HCT 28.6* 25.5* 26.5* 24.3*  MCV 95.7  --  95.3 96.8  PLT 269  --  272 249   BMP &GFR Recent Labs  Lab 11/18/24 1408 11/18/24 1923 11/19/24 0410 11/20/24 0349  NA 137 137 140 144  K 4.4 4.2 3.9 4.1  CL 102 105 107 109  CO2 24 22 21* 18*  GLUCOSE 161* 139* 96 90  BUN 44* 41* 35* 26*  CREATININE 3.72* 3.07* 2.65* 2.27*  CALCIUM  9.0 8.5* 8.7* 8.8*  MG  --   --   --  2.2  PHOS  --   --   --  3.2   Estimated Creatinine Clearance: 35.7 mL/min (A) (by C-G formula based on SCr of 2.27 mg/dL (H)).  Liver & Pancreas: Recent Labs  Lab 11/18/24 1408 11/19/24 0410 11/20/24 0349  AST 18 15  --   ALT 11 9  --   ALKPHOS 115 102  --   BILITOT 0.3 0.4  --   PROT 7.6 7.1  --   ALBUMIN 3.9 3.6 3.5   No results for input(s): LIPASE, AMYLASE in the last 168 hours. No results for input(s): AMMONIA in the last 168 hours. Diabetic: Recent Labs    11/18/24 1408  HGBA1C 5.9*   Recent Labs  Lab 11/19/24 1121 11/19/24 1641 11/19/24 2145 11/20/24 0720 11/20/24 1141  GLUCAP 104* 99 121* 79 81   Cardiac Enzymes: No results for input(s): CKTOTAL, CKMB, CKMBINDEX, TROPONINI in the last 168 hours. No results for input(s): PROBNP in the last 8760 hours. Coagulation Profile: No results for input(s): INR, PROTIME in the last 168 hours. Thyroid  Function Tests: No results for input(s): TSH, T4TOTAL, FREET4, T3FREE, THYROIDAB in the last 72 hours. Lipid Profile: No results for input(s): CHOL, HDL, LDLCALC, TRIG, CHOLHDL, LDLDIRECT in the last 72 hours. Anemia Panel: Recent Labs    11/19/24 1052 11/19/24 1405   VITAMINB12  --  740  FOLATE  --  4.6*  FERRITIN  --  158  TIBC  --  239*  IRON  --  56  RETICCTPCT 1.2  --    Urine analysis:    Component Value Date/Time   COLORURINE STRAW (A) 11/18/2024 1344   APPEARANCEUR HAZY (A) 11/18/2024 1344   LABSPEC 1.008 11/18/2024 1344   PHURINE 5.0 11/18/2024 1344   GLUCOSEU NEGATIVE 11/18/2024 1344   HGBUR NEGATIVE 11/18/2024 1344   BILIRUBINUR NEGATIVE 11/18/2024 1344   BILIRUBINUR negative 01/18/2021 1621   BILIRUBINUR negative 07/01/2015 1108   KETONESUR NEGATIVE 11/18/2024 1344   PROTEINUR NEGATIVE 11/18/2024 1344   UROBILINOGEN 1.0 09/11/2021 1533   NITRITE NEGATIVE 11/18/2024 1344   LEUKOCYTESUR NEGATIVE 11/18/2024 1344   Sepsis Labs: Invalid input(s): PROCALCITONIN, LACTICIDVEN  Microbiology: No results found for this or any previous visit (from the past 240 hours).  Radiology Studies: ECHOCARDIOGRAM COMPLETE Result Date: 11/20/2024    ECHOCARDIOGRAM REPORT   Patient Name:   LOURDES KUCHARSKI Date of Exam: 11/20/2024 Medical Rec #:  [id number]          Height:       62.0 in Accession #:    [id number]         Weight:       278.9 lb Date of Birth:  1971/11/19          BSA:          2.202 m Patient Age:    53 years           BP:           110/50 mmHg Patient Gender: F                  HR:           66 bpm. Exam Location:  Inpatient Procedure: 2D Echo, Cardiac Doppler, Color Doppler and Intracardiac            Opacification Agent (Both Spectral and Color Flow Doppler were            utilized during procedure). Indications:    Syncope  History:        Patient has prior history of Echocardiogram examinations, most                 recent 02/03/2024.  Cardiomegaly, CHF and HFrEF; Risk                 Factors:Hypertension, Diabetes, Dyslipidemia and Sleep Apnea.                 CKD. Respiratory Failure.  Sonographer:    Logan Shove RDCS Referring Phys: [id number] MIGNON DASEN Joylynn Defrancesco IMPRESSIONS  1. Left ventricular ejection fraction, by estimation, is 50 to  55%. The left ventricle has low normal function. The left ventricle has no regional wall motion abnormalities. Left ventricular diastolic function could not be evaluated.  2. Right ventricular systolic function is normal. The right ventricular size is normal.  3. The mitral valve is normal in structure. Mild mitral valve regurgitation. No evidence of mitral stenosis.  4. The aortic valve is tricuspid. Aortic valve regurgitation is not visualized.  5. The inferior vena cava is normal in size with greater than 50% respiratory variability, suggesting right atrial pressure of 3 mmHg. FINDINGS  Left Ventricle: Left ventricular ejection fraction, by estimation, is 50 to 55%. The left ventricle has low normal function. The left ventricle has no regional wall motion abnormalities. Definity  contrast agent was given IV to delineate the left ventricular endocardial borders. The left ventricular internal cavity size was normal in size. There is no left ventricular hypertrophy. Left ventricular diastolic function could not be evaluated. Right Ventricle: The right ventricular size is normal. Right ventricular systolic function is normal. Left Atrium: Left atrial size was normal in size. Right Atrium: Right atrial size was not well visualized. Pericardium: There is no evidence of pericardial effusion. Mitral Valve: The mitral valve is normal in structure. Mild mitral valve regurgitation. No evidence of mitral valve stenosis. Tricuspid Valve: The tricuspid valve is normal in structure. Tricuspid valve regurgitation is not demonstrated. No evidence of tricuspid stenosis. Aortic Valve: The aortic valve is tricuspid. Aortic valve regurgitation is not visualized. Pulmonic Valve: The pulmonic valve was not well visualized. Pulmonic valve regurgitation is not visualized. Aorta: The aortic root is normal in size and structure. Venous: The inferior vena cava is normal in size with greater than 50% respiratory variability, suggesting right  atrial pressure of 3 mmHg. IAS/Shunts: No atrial level shunt detected by color flow Doppler. Redell Shallow MD Electronically signed by Redell Shallow MD Signature Date/Time: 11/20/2024/1:53:14 PM    Final       Mignon DASEN. Tryston Gilliam [facility]  If 7PM-7AM, please contact night-coverage [email or web address] 11/20/2024, 3:21 PM

## 2024-11-20 NOTE — TOC Initial Note (Signed)
 Transition of Care [facility]) - Initial/Assessment Note    Patient Details  Name: Brandi Gomez MRN: [id number] Date of Birth: 08-Jun-1971  [facility] CM/SW Contact:    Sonda Manuella Quill, RN Phone Number: 11/20/2024, 6:10 PM  Clinical Narrative:                 [place] w/ pt in room; pt said she lives at home w/her dtr; she plans to return w/ family/friends support at d/c; they will provide transportation; pt identified POC dtr Meryle Agent [phone]); she denied SDOH risks; pt has walker, and cpap; she does not have HH services or home oxygen ; pt agreed to receive recc HHPT; pt said she HHPT before and she did not like the agency; pt said she does not remember the name of the agency, and her dtr does not know the name of agency; faxed out in hub; awaiting offers; IP CM is following.  Expected Discharge Plan: Home w Home Health Services Barriers to Discharge: Continued Medical Work up   Patient Goals and CMS Choice Patient states their goals for this hospitalization and ongoing recovery are:: home CMS Medicare.gov Compare Post Acute Care list provided to:: Patient   [place] ownership interest in [facility].provided to:: Patient    Expected Discharge Plan and Services   Discharge Planning Services: CM Consult   Living arrangements for the past 2 months: Single Family Home                 DME Arranged: N/A DME Agency: NA                  Prior Living Arrangements/Services Living arrangements for the past 2 months: Single Family Home Lives with:: Adult Children Patient language and need for interpreter reviewed:: Yes Do you feel safe going back to the place where you live?: Yes      Need for Family Participation in Patient Care: Yes (Comment) Care giver support system in place?: Yes (comment) Current home services: DME (walker, cpap) Criminal Activity/Legal Involvement Pertinent to Current Situation/Hospitalization: No - Comment as  needed  Activities of Daily Living   ADL Screening (condition at time of admission) Independently performs ADLs?: Yes (appropriate for developmental age) Is the patient deaf or have difficulty hearing?: No Does the patient have difficulty seeing, even when wearing glasses/contacts?: No Does the patient have difficulty concentrating, remembering, or making decisions?: No  Permission Sought/Granted Permission sought to share information with : Case Manager Permission granted to share information with : Yes, Verbal Permission Granted  Share Information with NAME: Case Manager     Permission granted to share info w Relationship: Meryle Agent (dtr) [phone]     Emotional Assessment Appearance:: Appears stated age Attitude/Demeanor/Rapport: Gracious Affect (typically observed): Accepting Orientation: : Oriented to Self, Oriented to Place, Oriented to  Time, Oriented to Situation Alcohol / Substance Use: Not Applicable Psych Involvement: No (comment)  Admission diagnosis:  Generalized weakness [R53.1] AKI (acute kidney injury) [N17.9] Syncope, unspecified syncope type [R55] Anemia, unspecified type [D64.9] Patient Active Problem List   Diagnosis Date Noted   AKI (acute kidney injury) 11/18/2024   Chronic combined systolic and diastolic congestive heart failure (HCC) 11/18/2024   Normocytic anemia 11/18/2024   Elevated troponin 02/03/2024   Acute respiratory failure with hypoxia (HCC) 02/02/2024   Multiple perforations of both tympanic membranes 02/06/2021   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, unspecified cerebrovascular disease type (HCC) 01/18/2021  Morbid (severe) obesity due to excess calories (HCC) 04/18/2020   HLD (hyperlipidemia) 10/12/2019   CKD (chronic kidney disease) stage 3, GFR 30-59 ml/min (HCC) 10/12/2019   Acute on chronic HFrEF (heart failure with reduced ejection fraction) (HCC)    Depression  07/14/2019   Bipolar disorder (HCC) 05/13/2019   Bipolar 1 disorder (HCC) 05/13/2019   Adjustment disorder with anxiety    Pharyngoesophageal dysphagia 09/24/2017   Laryngopharyngeal reflux (LPR) 09/24/2017   History of recent stroke 08/06/2017   OSA (obstructive sleep apnea)    Controlled type 2 diabetes mellitus without complication, without long-term current use of insulin  (HCC) 05/08/2017   Hypertension associated with diabetes (HCC) 07/27/2008   Seizure disorder (HCC) 02/05/2007   PCP:  Leontine Cramp, NP Pharmacy:   [facility] [phone] [address], [place] - [address] AT [facility] [address] [place] [address] Phone: [phone] Fax: [phone]  [facility] - [address], [place] - [address] [place] [address] Phone: [phone] Fax: [phone]     Social Drivers of Health (SDOH) Social History: SDOH Screenings   Food Insecurity: No Food Insecurity (11/20/2024)  Housing: Low Risk (11/20/2024)  Transportation Needs: No Transportation Needs (11/20/2024)  Utilities: Not At Risk (11/20/2024)  Alcohol Screen: Low Risk (02/04/2024)  Financial Resource Strain: Low Risk (02/04/2024)  Tobacco Use: Low Risk (11/19/2024)   SDOH Interventions: Food Insecurity Interventions: Intervention Not Indicated, Inpatient TOC Housing Interventions: Intervention Not Indicated, Inpatient TOC Transportation Interventions: Intervention Not Indicated, Inpatient TOC Utilities Interventions: Intervention Not Indicated, Inpatient TOC   Readmission Risk Interventions    02/04/2024    3:47 PM  Readmission Risk Prevention Plan  Post Dischage Appt Complete  Medication Screening Complete  Transportation Screening Complete

## 2024-11-20 NOTE — Progress Notes (Signed)
°   11/20/24 0024  BiPAP/CPAP/SIPAP  $ Non-Invasive Home Ventilator  Subsequent  BiPAP/CPAP/SIPAP Pt Type Adult  BiPAP/CPAP/SIPAP DREAMSTATIOND  Mask Type Full face mask  Mask Size Medium  FiO2 (%) 21 %  Patient Home Machine No  Patient Home Mask No  Patient Home Tubing No  Auto Titrate Yes  Minimum cmH2O 5 cmH2O  Maximum cmH2O 20 cmH2O  Device Plugged into RED Power Outlet Yes  BiPAP/CPAP /SiPAP Vitals  Resp 12  MEWS Score/Color  MEWS Score 1  MEWS Score Color Green

## 2024-11-21 ENCOUNTER — Other Ambulatory Visit (HOSPITAL_COMMUNITY): Payer: Self-pay

## 2024-11-21 DIAGNOSIS — I152 Hypertension secondary to endocrine disorders: Secondary | ICD-10-CM | POA: Diagnosis not present

## 2024-11-21 DIAGNOSIS — I5042 Chronic combined systolic (congestive) and diastolic (congestive) heart failure: Secondary | ICD-10-CM | POA: Diagnosis not present

## 2024-11-21 DIAGNOSIS — E119 Type 2 diabetes mellitus without complications: Secondary | ICD-10-CM | POA: Diagnosis not present

## 2024-11-21 DIAGNOSIS — G4733 Obstructive sleep apnea (adult) (pediatric): Secondary | ICD-10-CM | POA: Diagnosis not present

## 2024-11-21 DIAGNOSIS — R7989 Other specified abnormal findings of blood chemistry: Secondary | ICD-10-CM | POA: Diagnosis not present

## 2024-11-21 DIAGNOSIS — N179 Acute kidney failure, unspecified: Secondary | ICD-10-CM | POA: Diagnosis not present

## 2024-11-21 DIAGNOSIS — G40909 Epilepsy, unspecified, not intractable, without status epilepticus: Secondary | ICD-10-CM | POA: Diagnosis not present

## 2024-11-21 DIAGNOSIS — N1831 Chronic kidney disease, stage 3a: Secondary | ICD-10-CM | POA: Diagnosis not present

## 2024-11-21 DIAGNOSIS — E1159 Type 2 diabetes mellitus with other circulatory complications: Secondary | ICD-10-CM | POA: Diagnosis not present

## 2024-11-21 LAB — RENAL FUNCTION PANEL
Albumin: 3.4 g/dL — ABNORMAL LOW (ref 3.5–5.0)
Anion gap: 9 (ref 5–15)
BUN: 19 mg/dL (ref 6–20)
CO2: 24 mmol/L (ref 22–32)
Calcium: 8.9 mg/dL (ref 8.9–10.3)
Chloride: 107 mmol/L (ref 98–111)
Creatinine, Ser: 1.84 mg/dL — ABNORMAL HIGH (ref 0.44–1.00)
GFR, Estimated: 32 mL/min — ABNORMAL LOW (ref >60)
Glucose, Bld: 100 mg/dL — ABNORMAL HIGH (ref 70–99)
Phosphorus: 3 mg/dL (ref 2.5–4.6)
Potassium: 4.4 mmol/L (ref 3.5–5.1)
Sodium: 140 mmol/L (ref 135–145)

## 2024-11-21 LAB — CBC
HCT: 26.7 % — ABNORMAL LOW (ref 36.0–46.0)
Hemoglobin: 8.3 g/dL — ABNORMAL LOW (ref 12.0–15.0)
MCH: 29.3 pg (ref 26.0–34.0)
MCHC: 31.1 g/dL (ref 30.0–36.0)
MCV: 94.3 fL (ref 80.0–100.0)
Platelets: 239 K/uL (ref 150–400)
RBC: 2.83 MIL/uL — ABNORMAL LOW (ref 3.87–5.11)
RDW: 16.3 % — ABNORMAL HIGH (ref 11.5–15.5)
WBC: 7.8 K/uL (ref 4.0–10.5)
nRBC: 0 % (ref 0.0–0.2)

## 2024-11-21 LAB — OCCULT BLOOD X 1 CARD TO LAB, STOOL: Fecal Occult Bld: NEGATIVE

## 2024-11-21 LAB — GLUCOSE, CAPILLARY
Glucose-Capillary: 94 mg/dL (ref 70–99)
Glucose-Capillary: 94 mg/dL (ref 70–99)

## 2024-11-21 LAB — MAGNESIUM: Magnesium: 2.3 mg/dL (ref 1.7–2.4)

## 2024-11-21 MED ORDER — SENNOSIDES-DOCUSATE SODIUM 8.6-50 MG PO TABS: 1.0000 | ORAL_TABLET | Freq: Two times a day (BID) | ORAL | Status: AC | PRN

## 2024-11-21 MED ORDER — FOLIC ACID 1 MG PO TABS
1.0000 mg | ORAL_TABLET | Freq: Every day | ORAL | 0 refills | Status: AC
  Filled 2024-11-21: qty 90, 90d supply, fill #0

## 2024-11-21 MED ORDER — TORSEMIDE 20 MG PO TABS: 40.0000 mg | ORAL_TABLET | Freq: Every day | ORAL | 0 refills | Status: AC

## 2024-11-21 NOTE — Discharge Summary (Signed)
 Physician Discharge Summary  [name] FMW:[id number] DOB: 08-Apr-1971 DOA: 11/18/2024  PCP: Leontine Cramp, NP  Admit date: 11/18/2024 Discharge date: 11/21/2024  Admitted From: Home Disposition: Home Recommendations for Outpatient Follow-up:  Outpatient follow-up with PCP and cardiology in 1 to 2 weeks Check BP, fluid status, CMP and CBC at follow-up Please follow up on the following pending results: None  Home Health: HHPT/RN Equipment/Devices: Patient has appropriate DME's  Discharge Condition: Stable CODE STATUS: Full code   Contact information for follow-up providers     Leontine Cramp, NP. Schedule an appointment as soon as possible for a visit in 1 week(s).   Specialty: Nurse Practitioner Contact information: [address] [place] [address] [phone]              Contact information for after-discharge care     Home Medical Care     [facility]) .   Service: Home Health Services Contact information: [address] [place] [address] [place]  [address] [phone]                     Hospital course 53 year old F with PMH of combined CHF, CVA, CKD-3A, DM-2, OSA on CPAP, morbid obesity, memory loss, anxiety, depression, bipolar disorder, memory loss, anemia and seizure disorder presenting with intermittent blurry vision for about 2 weeks, low BP to 72/40, postural dizziness, syncope and decreased urine output, and admitted with AKI.  Patient was on diuretics and multiple antihypertensive meds for CHF.    In ED, stable vitals.  BP 99/67.  Cr 3.72 (was 1.54 in 04/2024).  Hgb 8.6.  Hemoccult negative.  CXR without acute finding.  Patient was started on IV fluid and admitted for further care.   Blood pressure and AKI improved.  Hemoglobin dropped without overt bleeding.  Transfusing 1 unit with appropriate response.  Anemia panel with low folate.  Repeat Hemoccult negative.  Repeat  echocardiogram with LVEF of 50 to 55% (previously 30 to 35%) and normal RVSP.  On the day of discharge, patient feels well.  AKI improved.  Blood pressure improved. Hemoglobin remained stable.  Discontinue losartan .  Decrease torsemide  from 40 mg twice daily to 40 mg daily starting on 12/17.  Advised to hold Aldactone  and to stop aspirin .  Outpatient follow-up with PCP and cardiology in 1 to 2 weeks.  Home health PT ordered as recommended by therapy.  Also ordered home health RN for wound care and CHF.  See individual problem list below for more.   Problems addressed during this hospitalization AKI on CKD-3A: b/l Cr 1.4-1.5 but no recent value.  AKI likely due to hypotension, diuretics and ARB.  Improved. Recent Labs    02/07/24 0800 02/08/24 0828 02/22/24 0748 03/01/24 1502 05/06/24 1230 11/18/24 1408 11/18/24 1923 11/19/24 0410 11/20/24 0349 11/21/24 0404  BUN 22* 24* 29* 17 15 44* 41* 35* 26* 19  CREATININE 1.75* 1.65* 1.74* 1.43* 1.54* 3.72* 3.07* 2.65* 2.27* 1.84*  - Adjusted cardiac meds/diuretics as above. -Advised to stop aspirin  and NSAID.   Orthostasis/hypotension: Likely iatrogenic from medications.  Reports home BP of 72/40.  Orthostatic vitals negative.  She is also anemic that has improved with blood transfusion.  Improved LVEF on echocardiogram.. -Discontinued ARB and decrease diuretics.  Patient to hold diuretics until 12/17.   Chronic combined CHF/moderate PAH: TTE with LVEF of 50 to 55% (was 30 to 35% in 01/2024). RHC in 02/2024 with severely elevated left and right-sided  filling pressures, moderate PAH and normal cardiac output.  No cardiopulmonary symptoms.  Difficult to assess fluid status due to body habitus.  Followed by Dr. Zenaida with advanced heart failure team. - Discontinue losartan  in the setting of AKI and hypotension -Continue home Coreg  -Decrease torsemide  from 40 mg twice daily to 40 mg daily starting 12/17 -Advised to hold Aldactone  until  12/17. -Reassess fluid status, blood pressure and renal function at follow-up   Anemia of renal disease: b/l Hgb 8-9.  Slight drop likely hemodilution.  Denies melena or hematochezia.  She takes full dose aspirin  as needed for headache.  Anemia panel with low folic acid .  Hemoglobin improved after 1 unit.  Initial and repeat Hemoccult negative. - Continue folic acid    NIDDM-2 with hyperglycemia: A1c 5.9%.  On glipizide  10 mg daily at home. -Continue home meds  OSA on CPAP: Reports using CPAP at home - Continue CPAP at night   Anxiety, depression and bipolar disorder: Stable -Continue home Abilify , clobazam  and Lamictal .   Elevated troponin: Mild.  No chest pain.  EKG without acute finding.  No RWMA on echocardiogram.   Blurry vision: likely due due to hypotension.  Resolved.   Memory loss: Patient reports this.  She is oriented x 4 with the help of wall calendar. - Reorientation and delirium precaution   Hyperlipidemia -Continue home statin.   Morbid obesity Body mass index is 51.01 kg/m.           Consultations: None  Time spent 35  minutes  Vital signs Vitals:   11/20/24 2000 11/20/24 2304 11/21/24 0000 11/21/24 0434  BP: 119/77   133/70  Pulse: 66   80  Temp: 98.6 F (37 C)   98.4 F (36.9 C)  Resp: 20 (!) 26 18 19   Height:      Weight:      SpO2: 98%   96%  TempSrc: Oral   Oral  BMI (Calculated):         Discharge exam  GENERAL: No apparent distress.  Nontoxic. HEENT: MMM.  Vision and hearing grossly intact.  NECK: Supple.  No apparent JVD.  RESP:  No IWOB.  Fair aeration bilaterally. CVS:  RRR. Heart sounds normal.  ABD/GI/GU: BS+. Abd soft, NTND.  MSK/EXT:  Moves extremities. No apparent deformity.  Trace BLE edema. SKIN: Skin maceration over abdominal skin folds. NEURO: AA.  Oriented appropriately.  No apparent focal neuro deficit. PSYCH: Calm. Normal affect.    Discharge Instructions Discharge Instructions     Discharge instructions    Complete by: As directed    It has been a pleasure taking care of you!  You were hospitalized due to low blood pressure and acute kidney injury.  Your blood pressure and kidney functions improved.  We have adjusted your heart and fluid medications during this hospitalization.  Please review your new medication list and the directions on your medications before you take them.  Will recommend you avoid taking full dose aspirin  or any over-the-counter pain medication other than plain Tylenol .  You also received blood transfusion for anemia.  Follow-up with your primary care doctor and cardiologist in 1 week.   Take care,   Discharge wound care:   Complete by: As directed    Wound care  2 times daily      Cleanse L abdominal fold/pannus with soap and water , dry and apply floor stock antifungal powder (microguard white and green label) to area.  Apply Interdry AG as follows: Order Gerlean #  [id number] Measure and cut length of InterDry to fit in skin folds that have skin breakdown Tuck InterDry fabric into skin folds in a single layer, allow for 2 inches of overhang from skin edges to allow for wicking to occur May remove to bathe; dry area thoroughly and then tuck into affected areas again Do not apply any creams or ointments when using InterDry DO NOT THROW AWAY FOR 5 DAYS unless soiled with stool DO NOT [facility] product, this will inactivate the silver in the material  New sheet of Interdry should be applied after 5 days of use if patient continues to have skin breakdown   Increase activity slowly   Complete by: As directed       Allergies as of 11/21/2024       Reactions   Amoxicillin  Itching   Lisinopril  Swelling, Other (See Comments)   Angioedema    Tegretol  [carbamazepine ] Anaphylaxis, Swelling, Other (See Comments)   Throat swells   Hydrocodone  Other (See Comments)   Depressed        Medication List     PAUSE taking these medications    spironolactone  25 MG tablet Wait to  take this until: November 24, 2024 Commonly known as: ALDACTONE  Take 1 tablet (25 mg total) by mouth daily.       STOP taking these medications    aspirin  EC 325 MG tablet   losartan  100 MG tablet Commonly known as: COZAAR    potassium chloride  SA 20 MEQ tablet Commonly known as: KLOR-CON  M       TAKE these medications    Accu-Chek Guide Test test strip Generic drug: glucose blood daily.   acetaminophen  325 MG tablet Commonly known as: TYLENOL  Take 2 tablets (650 mg total) by mouth every 4 (four) hours as needed for mild pain (or temp > 37.5 C (99.5 F)). What changed: reasons to take this   ARIPiprazole  10 MG tablet Commonly known as: ABILIFY  Take 0.5 tablets (5 mg total) by mouth daily.   atorvastatin  40 MG tablet Commonly known as: LIPITOR TAKE 1 TABLET (40 MG TOTAL) BY MOUTH DAILY AT 6 PM FOR CHOLESTEROL What changed: See the new instructions.   carvedilol  6.25 MG tablet Commonly known as: COREG  Take 6.25 mg by mouth 2 (two) times daily.   chlorhexidine 0.12 % solution Commonly known as: PERIDEX Use as directed 15 mLs in the mouth or throat daily.   cloBAZam  10 MG tablet Commonly known as: ONFI  Take 1 tablet (10 mg total) by mouth at bedtime.   cyanocobalamin  1000 MCG tablet Commonly known as: VITAMIN B12 Take 1 tablet (1,000 mcg total) by mouth daily.   folic acid  1 MG tablet Commonly known as: FOLVITE  Take 1 tablet (1 mg total) by mouth daily. Start taking on: November 22, 2024   glipiZIDE  10 MG tablet Commonly known as: GLUCOTROL  Take 10 mg by mouth daily before breakfast.   hydrOXYzine  25 MG capsule Commonly known as: Vistaril  1 QHS  1 qday PRN   lamoTRIgine  100 MG tablet Commonly known as: LAMICTAL  Take 300 mg by mouth in the morning and at bedtime. What changed: Another medication with the same name was removed. Continue taking this medication, and follow the directions you see here.   Microlet Lancets Misc daily.   Mounjaro 5  MG/0.5ML Pen Generic drug: tirzepatide Inject 5 mg into the skin every Wednesday.   senna-docusate 8.6-50 MG tablet Commonly known as: Senokot-S Take 1-2 tablets by mouth 2 (two) times daily between meals as needed  for mild constipation or moderate constipation.   Symbicort  160-4.5 MCG/ACT inhaler Generic drug: budesonide -formoterol  Inhale 2 puffs into the lungs in the morning and at bedtime.   torsemide  20 MG tablet Commonly known as: DEMADEX  Take 2 tablets (40 mg total) by mouth daily. Start taking on: November 24, 2024 What changed:  when to take this These instructions start on November 24, 2024. If you are unsure what to do until then, ask your doctor or other care provider. Another medication with the same name was removed. Continue taking this medication, and follow the directions you see here.   Vitamin D  (Ergocalciferol ) 1.25 MG (50000 UNIT) Caps capsule Commonly known as: DRISDOL  Take 1 capsule (50,000 Units total) by mouth every 7 (seven) days.               Discharge Care Instructions  (From admission, onward)           Start     Ordered   11/21/24 0000  Discharge wound care:       Comments: Wound care  2 times daily      Cleanse L abdominal fold/pannus with soap and water , dry and apply floor stock antifungal powder (microguard white and green label) to area.  Apply Minna ARTHURS as follows: Order Gerlean # [phone] Measure and cut length of InterDry to fit in skin folds that have skin breakdown Tuck InterDry fabric into skin folds in a single layer, allow for 2 inches of overhang from skin edges to allow for wicking to occur May remove to bathe; dry area thoroughly and then tuck into affected areas again Do not apply any creams or ointments when using InterDry DO NOT THROW AWAY FOR 5 DAYS unless soiled with stool DO NOT [facility] product, this will inactivate the silver in the material  New sheet of Interdry should be applied after 5 days of use if patient  continues to have skin breakdown   11/21/24 0758             Procedures/Studies:   ECHOCARDIOGRAM COMPLETE Result Date: 11/20/2024    ECHOCARDIOGRAM REPORT   Patient Name:   Brandi Gomez Date of Exam: 11/20/2024 Medical Rec #:  [id number]          Height:       62.0 in Accession #:    [id number]         Weight:       278.9 lb Date of Birth:  [birth year]          BSA:          2.202 m Patient Age:    53 years           BP:           110/50 mmHg Patient Gender: F                  HR:           66 bpm. Exam Location:  Inpatient Procedure: 2D Echo, Cardiac Doppler, Color Doppler and Intracardiac            Opacification Agent (Both Spectral and Color Flow Doppler were            utilized during procedure). Indications:    Syncope  History:        Patient has prior history of Echocardiogram examinations, most                 recent 02/03/2024. Cardiomegaly, CHF and HFrEF; Risk  Factors:Hypertension, Diabetes, Dyslipidemia and Sleep Apnea.                 CKD. Respiratory Failure.  Sonographer:    Logan Shove RDCS Referring Phys: [id number] MIGNON DASEN Lynzi Meulemans IMPRESSIONS  1. Left ventricular ejection fraction, by estimation, is 50 to 55%. The left ventricle has low normal function. The left ventricle has no regional wall motion abnormalities. Left ventricular diastolic function could not be evaluated.  2. Right ventricular systolic function is normal. The right ventricular size is normal.  3. The mitral valve is normal in structure. Mild mitral valve regurgitation. No evidence of mitral stenosis.  4. The aortic valve is tricuspid. Aortic valve regurgitation is not visualized.  5. The inferior vena cava is normal in size with greater than 50% respiratory variability, suggesting right atrial pressure of 3 mmHg. FINDINGS  Left Ventricle: Left ventricular ejection fraction, by estimation, is 50 to 55%. The left ventricle has low normal function. The left ventricle has no regional wall motion  abnormalities. Definity  contrast agent was given IV to delineate the left ventricular endocardial borders. The left ventricular internal cavity size was normal in size. There is no left ventricular hypertrophy. Left ventricular diastolic function could not be evaluated. Right Ventricle: The right ventricular size is normal. Right ventricular systolic function is normal. Left Atrium: Left atrial size was normal in size. Right Atrium: Right atrial size was not well visualized. Pericardium: There is no evidence of pericardial effusion. Mitral Valve: The mitral valve is normal in structure. Mild mitral valve regurgitation. No evidence of mitral valve stenosis. Tricuspid Valve: The tricuspid valve is normal in structure. Tricuspid valve regurgitation is not demonstrated. No evidence of tricuspid stenosis. Aortic Valve: The aortic valve is tricuspid. Aortic valve regurgitation is not visualized. Pulmonic Valve: The pulmonic valve was not well visualized. Pulmonic valve regurgitation is not visualized. Aorta: The aortic root is normal in size and structure. Venous: The inferior vena cava is normal in size with greater than 50% respiratory variability, suggesting right atrial pressure of 3 mmHg. IAS/Shunts: No atrial level shunt detected by color flow Doppler. Redell Shallow MD Electronically signed by Redell Shallow MD Signature Date/Time: 11/20/2024/1:53:14 PM    Final    DG Chest Port 1 View Result Date: 11/18/2024 CLINICAL DATA:  Syncope hypotension EXAM: PORTABLE CHEST 1 VIEW COMPARISON:  02/22/2024 FINDINGS: Mild cardiomegaly. No acute airspace disease, pleural effusion or pneumothorax. IMPRESSION: No active disease. Mild cardiomegaly. Electronically Signed   By: Luke Bun M.D.   On: 11/18/2024 15:40       The results of significant diagnostics from this hospitalization (including imaging, microbiology, ancillary and laboratory) are listed below for reference.     Microbiology: No results found for  this or any previous visit (from the past 240 hours).   Labs:  CBC: Recent Labs  Lab 11/18/24 1408 11/18/24 1923 11/19/24 0410 11/20/24 0349 11/21/24 0404  WBC 6.5  --  6.5 6.1 7.8  HGB 8.6* 8.0* 8.2* 7.5* 8.3*  HCT 28.6* 25.5* 26.5* 24.3* 26.7*  MCV 95.7  --  95.3 96.8 94.3  PLT 269  --  272 249 239   BMP &GFR Recent Labs  Lab 11/18/24 1408 11/18/24 1923 11/19/24 0410 11/20/24 0349 11/21/24 0404  NA 137 137 140 144 140  K 4.4 4.2 3.9 4.1 4.4  CL 102 105 107 109 107  CO2 24 22 21* 18* 24  GLUCOSE 161* 139* 96 90 100*  BUN 44* 41* 35* 26* 19  CREATININE  3.72* 3.07* 2.65* 2.27* 1.84*  CALCIUM  9.0 8.5* 8.7* 8.8* 8.9  MG  --   --   --  2.2 2.3  PHOS  --   --   --  3.2 3.0   Estimated Creatinine Clearance: 44 mL/min (A) (by C-G formula based on SCr of 1.84 mg/dL (H)). Liver & Pancreas: Recent Labs  Lab 11/18/24 1408 11/19/24 0410 11/20/24 0349 11/21/24 0404  AST 18 15  --   --   ALT 11 9  --   --   ALKPHOS 115 102  --   --   BILITOT 0.3 0.4  --   --   PROT 7.6 7.1  --   --   ALBUMIN 3.9 3.6 3.5 3.4*   No results for input(s): LIPASE, AMYLASE in the last 168 hours. No results for input(s): AMMONIA in the last 168 hours. Diabetic: Recent Labs    11/18/24 1408  HGBA1C 5.9*   Recent Labs  Lab 11/20/24 1141 11/20/24 1704 11/20/24 1956 11/21/24 0753 11/21/24 1214  GLUCAP 81 116* 125* 94 94   Cardiac Enzymes: No results for input(s): CKTOTAL, CKMB, CKMBINDEX, TROPONINI in the last 168 hours. No results for input(s): PROBNP in the last 8760 hours. Coagulation Profile: No results for input(s): INR, PROTIME in the last 168 hours. Thyroid  Function Tests: No results for input(s): TSH, T4TOTAL, FREET4, T3FREE, THYROIDAB in the last 72 hours. Lipid Profile: No results for input(s): CHOL, HDL, LDLCALC, TRIG, CHOLHDL, LDLDIRECT in the last 72 hours. Anemia Panel: Recent Labs    11/19/24 1052 11/19/24 1405   VITAMINB12  --  740  FOLATE  --  4.6*  FERRITIN  --  158  TIBC  --  239*  IRON  --  56  RETICCTPCT 1.2  --    Urine analysis:    Component Value Date/Time   COLORURINE STRAW (A) 11/18/2024 1344   APPEARANCEUR HAZY (A) 11/18/2024 1344   LABSPEC 1.008 11/18/2024 1344   PHURINE 5.0 11/18/2024 1344   GLUCOSEU NEGATIVE 11/18/2024 1344   HGBUR NEGATIVE 11/18/2024 1344   BILIRUBINUR NEGATIVE 11/18/2024 1344   BILIRUBINUR negative 01/18/2021 1621   BILIRUBINUR negative 07/01/2015 1108   KETONESUR NEGATIVE 11/18/2024 1344   PROTEINUR NEGATIVE 11/18/2024 1344   UROBILINOGEN 1.0 09/11/2021 1533   NITRITE NEGATIVE 11/18/2024 1344   LEUKOCYTESUR NEGATIVE 11/18/2024 1344   Sepsis Labs: Invalid input(s): PROCALCITONIN, LACTICIDVEN   SIGNED:  Alizia Greif T Iam Lipson, MD  [facility] 11/21/2024, 12:37 PM

## 2024-11-21 NOTE — Progress Notes (Signed)
 AVS given to patient and explained at the bedside. Medications and follow up appointments have been explained with pt verbalizing understanding.

## 2024-11-21 NOTE — TOC Transition Note (Signed)
 Transition of Care [facility]) - Discharge Note   Patient Details  Name: Brandi Gomez MRN: [id number] Date of Birth: 07/31/1971  Transition of [facility]) CM/SW Contact:  Sonda Manuella Quill, RN Phone Number: 11/21/2024, 9:05 AM   Clinical Narrative:    D/C orders received; pt accepted [facility] for HHPT/RN services; [place] at agency notified RN added to services; agency contact info placed if follow provider section of d/c instructions; no IP CM needs.   Final next level of care: Home w Home Health Services Barriers to Discharge: No Barriers Identified   Patient Goals and CMS Choice Patient states their goals for this hospitalization and ongoing recovery are:: home CMS Medicare.gov Compare Post Acute Care list provided to:: Patient   [facility] ownership interest in [facility]provided to:: Patient    Discharge Placement                       Discharge Plan and Services Additional resources added to the After Visit Summary for     Discharge Planning Services: CM Consult            DME Arranged: N/A DME Agency: NA       HH Arranged: PT, RN HH Agency: [facility] Date HH Agency Contacted: 11/21/24 Time HH Agency Contacted: [phone] Representative spoke with at [facility] Agency: [facility]  Social Drivers of Health (SDOH) Interventions SDOH Screenings   Food Insecurity: No Food Insecurity (11/20/2024)  Housing: Low Risk (11/20/2024)  Transportation Needs: No Transportation Needs (11/20/2024)  Utilities: Not At Risk (11/20/2024)  Alcohol Screen: Low Risk (02/04/2024)  Financial Resource Strain: Low Risk (02/04/2024)  Tobacco Use: Low Risk (11/19/2024)     Readmission Risk Interventions    02/04/2024    3:47 PM  Readmission Risk Prevention Plan  Post Dischage Appt Complete  Medication Screening Complete  Transportation Screening Complete

## 2024-11-22 ENCOUNTER — Other Ambulatory Visit (HOSPITAL_COMMUNITY): Payer: Self-pay

## 2024-11-22 ENCOUNTER — Telehealth: Payer: Self-pay | Admitting: Neurology

## 2024-11-22 LAB — TYPE AND SCREEN
ABO/RH(D): B POS
Antibody Screen: NEGATIVE
Unit division: 0

## 2024-11-22 LAB — BPAM RBC
Blood Product Expiration Date: 202601052359
ISSUE DATE / TIME: 202512131041
Unit Type and Rh: 7300

## 2024-11-22 NOTE — Telephone Encounter (Signed)
 Pt called  stating that  she was release from Hospital and  Discharged  paperwork  informed Pt to follow up with Neurologist  about changing dosage  Pt medication lamoTRIgine  (LAMICTAL ) 100 MG tablet.  Pt is requesting to speak to MD or Nurse

## 2024-11-23 ENCOUNTER — Other Ambulatory Visit (HOSPITAL_COMMUNITY): Payer: Self-pay

## 2024-11-23 NOTE — Telephone Encounter (Signed)
 I called pt.  She had been hospitalized and was home now.  She wanted to make sure about her sz medications.  I told her they did not make any changes in her sz medications.  Lamotrigine  300mg  po bid and clobazam  10mg  po at bedtime.  She appreciated call back.. she will see her pcp tomorrow and has to make appt with cardiology.

## 2024-11-26 ENCOUNTER — Ambulatory Visit (HOSPITAL_BASED_OUTPATIENT_CLINIC_OR_DEPARTMENT_OTHER): Admitting: Pulmonary Disease

## 2024-11-26 DIAGNOSIS — G4733 Obstructive sleep apnea (adult) (pediatric): Secondary | ICD-10-CM | POA: Insufficient documentation

## 2024-12-07 DIAGNOSIS — G4733 Obstructive sleep apnea (adult) (pediatric): Secondary | ICD-10-CM | POA: Diagnosis not present

## 2024-12-07 NOTE — Procedures (Signed)
 [facility] [address] [place], [place] [address] Tel: [phone]   Fax: [phone]  Split Night Interpretation  Patient Name:  Brandi Gomez, Brandi Gomez Date:  11/26/2024 Referring Physician:  CARTER HELLING [phone]) %%startinterp%% Indications for Polysomnography The patient is a 53 year old Female who is 5' 2 and weighs 278.0 lbs.  Her BMI equals 51.2.  A diagnostic polysomnogram was performed to evaluate for -.  After 123.5 minutes of sleep time the patient exhibited sufficient respiratory events qualifying her for a CPAP trial which was then initiated.    Medication  -  torsemide   lamotrigine   clobazam   carvedilol   atorvastatin    Polysomnogram Data A full night polysomnogram was performed recording the standard physiologic parameters including EEG, EOG, EMG, EKG, nasal and oral airflow.  Respiratory parameters of chest and abdominal movements are recorded with Peizo-Crystal motion transducers.  Oxygen  saturation was recorded by pulse oximetry.    Sleep Architecture The total recording time of the diagnostic portion of the study was 189.4 minutes.  The total sleep time was 123.5 minutes.  During the diagnostic portion of the study, the patient spent 0.4% of total sleep time in Stage N1, 71.7% in Stage N2, 27.9% in Stages N3, and 0.0% in REM.   Sleep latency was 17.4 minutes.  REM latency was - minutes.  Sleep Efficiency was 65.2%.  Wake after Sleep Onset time was 48.5 minutes.   At 12:27:50 AM the patient was placed on PAP treatment and was titrated at pressures ranging from 5* cm/H20 up to 14* cm/H20 without supplemental oxygen .  The total recording time of the treatment portion of the study was 190.6 minutes.  The total sleep time was 154.0 minutes.  During the treatment portion of the study, the patient spent 0.0% of total sleep time in Stage N1, 55.8% in Stage N2, 23.7% in Stages N3, and 20.5% in REM.   Sleep latency was 9.0 minutes.  REM  latency was 54.0 minutes.  Sleep Efficiency was 80.8%.  Wake after Sleep Onset time was 28.0 minutes.  Respiratory Events During the diagnostic portion of the study, the polysomnogram revealed a presence of 7 obstructive, 1 central, and - mixed apneas resulting in an Apnea index of 7.3 events per hour.  There were 31 hypopneas (>=3% desaturation and/or arousal) resulting in an Apnea\Hypopnea Index (AHI >=3% desaturation and/or arousal) of 22.3 events per hour.  There were 28 hypopneas (>=4% desaturation) resulting in an Apnea\Hypopnea Index (AHI >=4% desaturation) of 20.9 events per hour.  There were - Respiratory Effort Related Arousals resulting in a RERA index of - events per hour. The Respiratory Disturbance Index is 22.3 events per hour.  The snore index was 0.5 events per hour.  Mean oxygen  saturation was 87.9%.  The lowest oxygen  saturation during sleep was 75.0%.  Time spent <=88% oxygen  saturation was 108.7 minutes (57.6%).  During the treatment portion of the study, the polysomnogram revealed a presence of - obstructive, - central, and - mixed apneas resulting in an Apnea index of - events per hour.  There were 22 hypopneas (>=3% desaturation and/or arousal) resulting in an Apnea\Hypopnea Index (AHI >=3% desaturation and/or arousal) of 8.6 events per hour.  There were 19 hypopneas (>=4% desaturation) resulting in an Apnea\Hypopnea Index (AHI >=4% desaturation) of 7.4 events per hour.  There were - Respiratory Effort Related Arousals resulting in a RERA index of - events per hour. The Respiratory Disturbance Index is 8.6 events per hour.  The snore  index was - events per hour.  Mean oxygen  saturation was 90.8%.  The lowest oxygen  saturation during sleep was 72.0%.  Time spent <=88% oxygen  saturation was 24.6 minutes (12.9%).  Limb Activity During the diagnostic portion of the study, there were - limb movements recorded.    During the treatment portion of the study, there were - limb movements  recorded.    Cardiac Summary During the diagnostic portion of the study, the average pulse rate was 67.9 bpm.  The minimum pulse rate was 54.0 bpm while the maximum pulse rate was 81.0 bpm.  During the treatment portion of the study, the average pulse rate was 67.9 bpm.  The minimum pulse rate was 61.0 bpm while the maximum pulse rate was 77.0 bpm.   Comments:  Diagnosis: Moderate OSA corrected by CPAP 14 cm  Recommendations: Initiate CPAP 14 cm Alternatively, can use auto CPAP 10-14 cm Compliance & CPAP downloads can be monitored at this level Weight loss should be encouraged   This study was personally reviewed and electronically signed by: Jude Donning  MD Accredited Board Certified in Sleep Medicine  12/07/24

## 2024-12-08 ENCOUNTER — Ambulatory Visit: Payer: Self-pay

## 2024-12-10 ENCOUNTER — Other Ambulatory Visit: Payer: Self-pay

## 2024-12-10 DIAGNOSIS — G4733 Obstructive sleep apnea (adult) (pediatric): Secondary | ICD-10-CM

## 2024-12-10 NOTE — Progress Notes (Signed)
 am

## 2024-12-13 ENCOUNTER — Encounter: Payer: Self-pay | Admitting: Nephrology

## 2024-12-14 ENCOUNTER — Telehealth (HOSPITAL_COMMUNITY): Payer: Self-pay | Admitting: Cardiology

## 2024-12-14 NOTE — Telephone Encounter (Signed)
 Called to confirm/remind patient of their appointment at the [facility] on 12/14/24.   Appointment:   [x] Confirmed  [] Left mess   [] No answer/No voice mail  [] VM Full/unable to leave message  [] Phone not in service  Patient reminded to bring all medications and/or complete list.  Confirmed patient has transportation. Gave directions, instructed to utilize valet parking.

## 2024-12-15 ENCOUNTER — Ambulatory Visit (HOSPITAL_COMMUNITY)
Admission: RE | Admit: 2024-12-15 | Discharge: 2024-12-15 | Disposition: A | Discharge status: Home / Self Care | Source: Ambulatory Visit | Attending: Cardiology | Admitting: Cardiology

## 2024-12-15 ENCOUNTER — Encounter (HOSPITAL_COMMUNITY): Payer: Self-pay | Admitting: Cardiology

## 2024-12-15 VITALS — BP 106/62 | HR 62 | Ht 62.0 in

## 2024-12-15 DIAGNOSIS — I428 Other cardiomyopathies: Secondary | ICD-10-CM | POA: Insufficient documentation

## 2024-12-15 DIAGNOSIS — Z7985 Long-term (current) use of injectable non-insulin antidiabetic drugs: Secondary | ICD-10-CM | POA: Diagnosis not present

## 2024-12-15 DIAGNOSIS — Z79899 Other long term (current) drug therapy: Secondary | ICD-10-CM | POA: Diagnosis not present

## 2024-12-15 DIAGNOSIS — E1165 Type 2 diabetes mellitus with hyperglycemia: Secondary | ICD-10-CM

## 2024-12-15 DIAGNOSIS — R7989 Other specified abnormal findings of blood chemistry: Secondary | ICD-10-CM | POA: Insufficient documentation

## 2024-12-15 DIAGNOSIS — N1831 Chronic kidney disease, stage 3a: Secondary | ICD-10-CM

## 2024-12-15 DIAGNOSIS — N1832 Chronic kidney disease, stage 3b: Secondary | ICD-10-CM | POA: Insufficient documentation

## 2024-12-15 DIAGNOSIS — Z8673 Personal history of transient ischemic attack (TIA), and cerebral infarction without residual deficits: Secondary | ICD-10-CM | POA: Insufficient documentation

## 2024-12-15 DIAGNOSIS — Z6841 Body Mass Index (BMI) 40.0 and over, adult: Secondary | ICD-10-CM | POA: Insufficient documentation

## 2024-12-15 DIAGNOSIS — G4733 Obstructive sleep apnea (adult) (pediatric): Secondary | ICD-10-CM | POA: Diagnosis not present

## 2024-12-15 DIAGNOSIS — I5022 Chronic systolic (congestive) heart failure: Secondary | ICD-10-CM | POA: Insufficient documentation

## 2024-12-15 DIAGNOSIS — E1122 Type 2 diabetes mellitus with diabetic chronic kidney disease: Secondary | ICD-10-CM | POA: Insufficient documentation

## 2024-12-15 NOTE — Patient Instructions (Signed)
 There has been no changes to your medications.  Your physician recommends that you schedule a follow-up appointment in: 3 months.  If you have any questions or concerns before your next appointment please send us  a message through [facility] or call our office at [phone].    TO LEAVE A MESSAGE FOR THE NURSE SELECT OPTION 2, PLEASE LEAVE A MESSAGE INCLUDING: YOUR NAME DATE OF BIRTH CALL BACK NUMBER REASON FOR CALL**this is important as we prioritize the call backs  YOU WILL RECEIVE A CALL BACK THE SAME DAY AS LONG AS YOU CALL BEFORE 4:00 PM  At the [facility], you and your health needs are our priority. As part of our continuing mission to provide you with exceptional heart care, we have created designated Provider Care Teams. These Care Teams include your primary Cardiologist (physician) and Advanced Practice Providers (APPs- Physician Assistants and Nurse Practitioners) who all work together to provide you with the care you need, when you need it.   You may see any of the following providers on your designated Care Team at your next follow up: Dr Toribio Fuel Dr Ezra Shuck Dr. Morene Brownie Greig Mosses, NP Caffie Shed, [place] [facility] [employer], [place] Beckey Coe, NP Jordan Lee, NP Ellouise Class, NP Tinnie Redman, PharmD Jaun Bash, PharmD   Please be sure to bring in all your medications bottles to every appointment.    Thank you for choosing [facility]

## 2024-12-20 NOTE — Progress Notes (Signed)
" ° °  ADVANCED HEART FAILURE FOLLOW UP CLINIC NOTE  Referring Physician: Leontine Cramp, NP  Primary Care: Leontine Cramp, NP Primary Cardiologist:  HPI: Brandi Gomez is a 54 y.o. female who presents for follow up of chronic systolic heart failure.      HF dates back to 2018. EF had been in range of 35-40%. Had not seen Cardiology since 2020.   Presented with acute respiratory failure 2/2 acute on chronic HFrEF on 02/02/24. Echo with EF 30-35%, grade II DD, RV okay. Cardiology consulted. She was diuresed with IV lasix . GDMT titrated. Had mild troponin elevation with flat trend. Ischemic workup deferred to outpatient setting.   Underwent coronary arteriography that was normal, moderately elevated filling pressures.      SUBJECTIVE:  Overall doing fair, recent hospitalization for hypovolemia and AKI, now improved.  Denies any dizziness, shortness of breath, lower extremity swelling, orthopnea, chest pain.  Is overall doing well from a heart failure symptom standpoint.  Takes her medications as prescribed.  PMH, current medications, allergies, social history, and family history reviewed in epic.  PHYSICAL EXAM: Vitals:   12/15/24 1507  BP: 106/62  Pulse: 62  SpO2: 97%   GENERAL: NAD, chronically ill appearing PULM:  Normal work of breathing, CTAB CARDIAC:  JVP: flat         Normal rate with regular rhythm. No murmurs, rubs or gallops.  Trace edema. Warm and well perfused extremities. ABDOMEN: Soft, non-tender, non-distended. NEUROLOGIC: Patient is oriented x3 with no focal or lateralizing neurologic deficits.     DATA REVIEW  ECG: 05/06/2024: NSR, prolonged PR interval    ECHO: EF 40-45% in 2018 Echo 2020 EF 35-40% 01/2024: LVEF 30-35%, grade II DD, RV systolic function  11/2024: LVEF 50 to 55%, low normal function, normal RV  CATH: 03/09/24: RA 18, PA 55/26 (36), PCWP 26, Fick CO/CI 7.5/3.29, normal coronary arterigraphy    ASSESSMENT & PLAN:  HFrEF: Improved EF  based on most recent echo, NICM, medical therapy decreased after admission for hypovolemia. - NYHA class III, multifactorial, appears euvolemic -Continue spironolactone  6.25 mg twice daily, spironolactone  25 mg daily -Continue torsemide  40 mg daily -No RAAS inhibition with recent admission, blood pressure well-controlled today -Off BiDil  -No SGLT2 with frequent UTIs - no ACE or ARNi with history of angioedema on ACE   CKD IIIb -Baseline Scr 1.5-1.8 - Therapy as above   DM II -A1c 5.9 1 month prior, down from 7.3 -On oral agents -Continue statin   Morbid obesity - BMI > 50 -On Mounjaro   OSA - Uses CPAP. Followed by Neuro   Hx CVA - thought to be cardioembolic - Loop recorder placed in 2018. No Afib discovered.     Morene Brownie, MD Advanced Heart Failure Mechanical Circulatory Support 12/20/2024 "

## 2024-12-27 ENCOUNTER — Other Ambulatory Visit (HOSPITAL_COMMUNITY): Payer: Self-pay

## 2024-12-27 MED ORDER — IRON 325 (65 FE) MG PO TABS
1.0000 | ORAL_TABLET | Freq: Every day | ORAL | 6 refills | Status: AC
  Filled 2024-12-27: qty 30, 30d supply, fill #0

## 2024-12-29 ENCOUNTER — Telehealth: Payer: Self-pay

## 2025-01-04 ENCOUNTER — Encounter

## 2025-01-04 ENCOUNTER — Telehealth: Payer: Self-pay | Admitting: *Deleted

## 2025-01-04 NOTE — Telephone Encounter (Signed)
 Telephone call to patient, PV scheduled today.  Patient is currently taking Mounjaro and last weight was taken on 1/7 at Cardiovascular clinic with BMI 50.85.  Patient states her weight today is 270lbs which puts her BMI at 49.4.  Weight today was on a home scale.  RN informed patient that home scales are sometimes not accurate and PV was rescheduled to an in-person visit on 2/2 at 1600 to obtain weight.  Pt was informed that if BMI is over 50 when weight was obtained, colonoscopy would need to be r/s'd at the hospital and she verbalized understanding.

## 2025-01-04 NOTE — Telephone Encounter (Signed)
 Team,  This pt's BMI is greater than 50; their procedure will need to be performed at the hospital.  Thanks,  Rogena Class

## 2025-01-05 ENCOUNTER — Telehealth: Payer: Self-pay

## 2025-01-05 ENCOUNTER — Ambulatory Visit (HOSPITAL_COMMUNITY): Admitting: Psychiatry

## 2025-01-05 VITALS — BP 116/74 | HR 64 | Ht 62.0 in | Wt 285.0 lb

## 2025-01-05 DIAGNOSIS — F23 Brief psychotic disorder: Secondary | ICD-10-CM

## 2025-01-05 MED ORDER — ARIPIPRAZOLE 5 MG PO TABS: 5.0000 mg | ORAL_TABLET | Freq: Every day | ORAL | 3 refills | Status: AC

## 2025-01-05 NOTE — Telephone Encounter (Signed)
 Pt PV will need to be r/s for for 2/4 or 2/5 due to planned inclement weather on 2/2. Unable to offer virtual apt because pt needs to be weighed before colonoscopy on 2/10. VM left making the patient aware.

## 2025-01-05 NOTE — Progress Notes (Signed)
 "  Psychiatric Initial Adult Assessment   Patient Identification: Brandi Gomez MRN:  [id number] Date of Evaluation:  01/05/2025 Referral Source: Dr. Elizbeth Chief Complaint:  Suspiciousness/paranoia Visit Diagnosis: Mood disorder secondary to CVA    Today the patient is seen in the office with Elsie her boyfriend.  The patient is only doing fairly well.  She seems sad.  Feels like her family is not supportive they seem to attack her and claimed that she is not being compliant.  She is not wearing her CPAP mask as she should.  She says she is taking all her medicines as prescribed.  She has a B12 deficiency and she is on B12 supplements and she has been on them for over 6 months.  She says she does not see much of a difference.  She also is off of folic acid .  At 1 time she was hospitalized for couple of days and received a transfusion of blood.  The patient has no evidence of psychosis.  She watches TV particularly so problems but she has a problem that she is spending too much money by buying close.  The patient has a hearing problem.  She has a hard time getting her hearing aids in.  She used to have a delusions about smells but that is gone.  She drinks no alcohol uses no drugs.  I believe she lives with Maurine Elsie is very supportive of her.  He remembers what she is to go fishing with him and do a lot more than she is doing now.  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 (Hypo) Manic Symptoms:   Anxiety Symptoms:   Psychotic Symptoms:   PTSD Symptoms:   Past Psychiatric History: Cymbalta  30 mg Previous Psychotropic Medications: Cymbalta  30 mg  Substance Abuse History in the last 12 months:  No.  Consequences of Substance Abuse:   Past Medical History:  Past Medical History:  Diagnosis Date   Anemia    Anxiety    Bipolar 1 disorder (HCC)    Common migraine 05/19/2015   Depression    Diabetes mellitus, type II (HCC)    Hypertension    Irritable bowel syndrome (IBS)     Mild mental retardation    Obesity    Partial complex seizure disorder with intractable epilepsy (HCC) 05/12/2014   Seizures (HCC)    intractable, sz 08/23/17   Sleep apnea    Stroke (HCC)    Type II or unspecified type diabetes mellitus without mention of complication, not stated as uncontrolled     Past Surgical History:  Procedure Laterality Date   BUBBLE STUDY  11/15/2019   Procedure: BUBBLE STUDY;  Surgeon: Maranda Leim DEL, MD;  Location: [facility] ENDOSCOPY;  Service: Cardiovascular;;   COLONOSCOPY     2012-normal , Dr Jakie   ESOPHAGOGASTRODUODENOSCOPY     normal-Dr Jakie 2012   LOOP RECORDER INSERTION N/A 05/30/2017   Procedure: Loop Recorder Insertion;  Surgeon: Inocencio Soyla Lunger, MD;  Location: [facility] INVASIVE CV LAB;  Service: Cardiovascular;  Laterality: N/A;   LOOP RECORDER REMOVAL N/A 03/04/2018   Procedure: LOOP RECORDER REMOVAL;  Surgeon: Inocencio Soyla Lunger, MD;  Location: [facility] INVASIVE CV LAB;  Service: Cardiovascular;  Laterality: N/A;   MYRINGOTOMY WITH TUBE PLACEMENT     MYRINGOTOMY WITH TUBE PLACEMENT Right 11/05/2017   Procedure: MYRINGOTOMY WITH TUBE PLACEMENT;  Surgeon: Roark Rush, MD;  Location: [facility];  Service: ENT;  Laterality: Right;  right T Tube placement   NASAL  SINUS SURGERY     RIGHT HEART CATH AND CORONARY ANGIOGRAPHY N/A 03/08/2024   Procedure: RIGHT HEART CATH AND CORONARY ANGIOGRAPHY;  Surgeon: Zenaida Morene PARAS, MD;  Location: [facility] INVASIVE CV LAB;  Service: Cardiovascular;  Laterality: N/A;   TEE WITHOUT CARDIOVERSION N/A 05/30/2017   Procedure: TRANSESOPHAGEAL ECHOCARDIOGRAM (TEE);  Surgeon: Alveta Aleene PARAS, MD;  Location: [facility] ENDOSCOPY;  Service: Cardiovascular;  Laterality: N/A;   TEE WITHOUT CARDIOVERSION N/A 11/15/2019   Procedure: TRANSESOPHAGEAL ECHOCARDIOGRAM (TEE);  Surgeon: Maranda Leim DEL, MD;  Location: [facility] ENDOSCOPY;  Service: Cardiovascular;  Laterality: N/A;    Family Psychiatric History:   Family History:  Family History  Problem  Relation Age of Onset   Diabetes Mother        passed away from accidental death   Hypertension Mother    Bipolar disorder Father    Diabetes Daughter    Leukemia Daughter    Ovarian cancer Maternal Aunt     Social History:   Social History   Socioeconomic History   Marital status: Single    Spouse name: Elsie   Number of children: 3   Years of education: 12   Highest education level: Not on file  Occupational History   Occupation: disabled    Employer: DISABLED  Tobacco Use   Smoking status: Never   Smokeless tobacco: Never  Vaping Use   Vaping status: Never Used  Substance and Sexual Activity   Alcohol use: No   Drug use: No   Sexual activity: Yes  Other Topics Concern   Not on file  Social History Narrative   Patient lives at home with daughter.    Patient has 3 children.    Patient is right handed.    Patient has a high school education.    Patient is on disability   Patient drinks 2 cups of caffeine daily.   Social Drivers of Health   Tobacco Use: Low Risk (12/15/2024)   Patient History    Smoking Tobacco Use: Never    Smokeless Tobacco Use: Never    Passive Exposure: Not on file  Financial Resource Strain: Low Risk (02/04/2024)   Overall Financial Resource Strain (CARDIA)    Difficulty of Paying Living Expenses: Not very hard  Food Insecurity: No Food Insecurity (11/20/2024)   Epic    Worried About Programme Researcher, Broadcasting/film/video in the Last Year: Never true    Ran Out of Food in the Last Year: Never true  Transportation Needs: No Transportation Needs (11/20/2024)   Epic    Lack of Transportation (Medical): No    Lack of Transportation (Non-Medical): No  Physical Activity: Not on file  Stress: Not on file  Social Connections: Not on file  Depression (EYV7-0): Not on file  Alcohol Screen: Low Risk (02/04/2024)   Alcohol Screen    Last Alcohol Screening Score (AUDIT): 0  Housing: Low Risk (11/20/2024)   Epic    Unable to Pay for Housing in the Last Year: No     Number of Times Moved in the Last Year: 0    Homeless in the Last Year: No  Utilities: Not At Risk (11/20/2024)   Epic    Threatened with loss of utilities: No  Health Literacy: Not on file    Additional Social History:   Allergies:   Allergies  Allergen Reactions   Amoxicillin  Itching   Lisinopril  Swelling and Other (See Comments)    Angioedema    Tegretol  [Carbamazepine ] Anaphylaxis, Swelling and Other (See Comments)  Throat swells   Hydrocodone  Other (See Comments)    Depressed    Metabolic Disorder Labs: Lab Results  Component Value Date   HGBA1C 5.9 (H) 11/18/2024   MPG 122.63 11/18/2024   MPG 163 02/03/2024   No results found for: PROLACTIN Lab Results  Component Value Date   CHOL 153 02/07/2024   TRIG 123 02/07/2024   HDL 37 (L) 02/07/2024   CHOLHDL 4.1 02/07/2024   VLDL 25 02/07/2024   LDLCALC 91 02/07/2024   LDLCALC 44 04/18/2020     Current Medications: Current Outpatient Medications  Medication Sig Dispense Refill   ACCU-CHEK GUIDE TEST test strip daily.     acetaminophen  (TYLENOL ) 325 MG tablet Take 2 tablets (650 mg total) by mouth every 4 (four) hours as needed for mild pain (or temp > 37.5 C (99.5 F)). (Patient taking differently: Take 650 mg by mouth every 4 (four) hours as needed for mild pain (pain score 1-3) (or temp > 37.5 C (99.5 F) OR for headaches).)     ARIPiprazole  (ABILIFY ) 10 MG tablet Take 0.5 tablets (5 mg total) by mouth daily. 30 tablet 2   atorvastatin  (LIPITOR) 40 MG tablet TAKE 1 TABLET (40 MG TOTAL) BY MOUTH DAILY AT 6 PM FOR CHOLESTEROL (Patient taking differently: Take 40 mg by mouth daily at 6 PM.) 90 tablet 0   carvedilol  (COREG ) 6.25 MG tablet Take 6.25 mg by mouth 2 (two) times daily.     chlorhexidine (PERIDEX) 0.12 % solution Use as directed 15 mLs in the mouth or throat daily.     cloBAZam  (ONFI ) 10 MG tablet Take 1 tablet (10 mg total) by mouth at bedtime. 30 tablet 5   cyanocobalamin  (VITAMIN B12) 1000 MCG  tablet Take 1 tablet (1,000 mcg total) by mouth daily. (Patient not taking: Reported on 12/15/2024) 90 tablet 0   Ferrous Sulfate  (IRON ) 325 (65 Fe) MG TABS 1 tab by mouth daily 30 tablet 6   folic acid  (FOLVITE ) 1 MG tablet Take 1 tablet (1 mg total) by mouth daily. 90 tablet 0   glipiZIDE  (GLUCOTROL ) 10 MG tablet Take 10 mg by mouth daily before breakfast.     hydrOXYzine  (VISTARIL ) 25 MG capsule 1 QHS  1 qday PRN 60 capsule 5   lamoTRIgine  (LAMICTAL ) 100 MG tablet Take 300 mg by mouth in the morning and at bedtime.     Microlet Lancets MISC daily.     MOUNJARO 5 MG/0.5ML Pen Inject 5 mg into the skin every Wednesday.     senna-docusate (SENOKOT-S) 8.6-50 MG tablet Take 1-2 tablets by mouth 2 (two) times daily between meals as needed for mild constipation or moderate constipation.     spironolactone  (ALDACTONE ) 25 MG tablet Take 1 tablet (25 mg total) by mouth daily. 90 tablet 3   SYMBICORT  160-4.5 MCG/ACT inhaler Inhale 2 puffs into the lungs in the morning and at bedtime.     torsemide  (DEMADEX ) 20 MG tablet Take 2 tablets (40 mg total) by mouth daily. 180 tablet 0   Vitamin D , Ergocalciferol , (DRISDOL ) 1.25 MG (50000 UNIT) CAPS capsule Take 1 capsule (50,000 Units total) by mouth every 7 (seven) days. 12 capsule 0   No current facility-administered medications for this visit.    Neurologic: Headache: No Seizure: Yes Paresthesias:No  Musculoskeletal: Strength & Muscle Tone: within normal limits Gait & Station: normal Patient leans: Backward and N/A  Psychiatric Specialty Exam: ROS  Blood pressure 116/74, pulse 64, height 5' 2 (1.575 m), weight 285 lb (129.3  kg).Body mass index is 52.13 kg/m.  General Appearance: Casual  Eye Contact:  Fair  Speech:  Slurred  Volume:  Normal  Mood:  Dysphoric  Affect:  Appropriate  Thought Process:  Goal Directed  Orientation:  Full (Time, Place, and Person)  Thought Content:  Logical  Suicidal Thoughts:  No  Homicidal Thoughts:  No   Memory:  Negative  Judgement:  Fair  Insight:  Lacking  Psychomotor Activity:  Decreased  Concentration:    Recall:  Fair  Fund of Knowledge:Fair  Language: Fair  Akathisia:  No  Handed:    AIMS (if indicated):    Assets:  Desire for Improvement  ADL's:  Intact  Cognition: WNL  Sleep:      Treatment Plan Summary:   This patient's diagnosis has been delusional disorder.  She her arm she no longer has smells and she is no longer trying to leave for no reason.  Her behavior seems to be fairly normal.  She has a very sedentary lifestyle.  Today we are going to reduce her Abilify  from 10 mg down to 5 mg and I hope that she has more energy and perhaps can start losing weight.  She takes a lot of other medical medicines but only Abilify  from me.  She also is on folic acid .  This patient to return to see me in 6 weeks.  The possibility is that we will reduce her Abilify  even further if necessary. "

## 2025-01-06 ENCOUNTER — Telehealth: Payer: Self-pay

## 2025-01-06 NOTE — Telephone Encounter (Signed)
 RN called to reschedule PV appointment due to possible inclement weather.  Patient scheduled for IN PERSON pre-visit on 01/12/2025 at 3:30 PM. Patient needs to be [facility] at [facility].  [facility] Address given to patient. Patient verbalizes understanding.

## 2025-01-10 ENCOUNTER — Encounter

## 2025-01-10 ENCOUNTER — Telehealth: Payer: Self-pay | Admitting: *Deleted

## 2025-01-10 NOTE — Telephone Encounter (Signed)
 Dr. Legrand,

## 2025-01-12 ENCOUNTER — Telehealth: Payer: Self-pay

## 2025-01-12 ENCOUNTER — Ambulatory Visit

## 2025-01-12 NOTE — Progress Notes (Unsigned)
 RN confirmed patient name, date of birth, and address RN confirmed date and time of procedure RN reviewed and confirmed allergies  RN reviewed and updated current medications; confirmed preferred pharmacy Pt is not on diet pills nor GLP-1 medications Pt is not on blood thinners RN reviewed medical & surgical hx  Pt denies issues with constipation  Diabetic - ** No A fib or A flutter No cardiac tests are pending  Pt is not on home 02  No issues known with past sedation with any surgeries or procedures Patient denies ever being told they had issues or difficulty with intubation  Patient unaware of any fh of malignant hyperthermia Ambulates ** RN reviewed prep instructions and explained time frames for holding certain medications RN answered patient questions; patient stated understanding Prep instructions sent

## 2025-01-12 NOTE — Telephone Encounter (Signed)
 Patient came in for pre-visit today. Brandi Gomez has assessed that patient needs to have her procedure done at hospital. Patient has multiple comorbidities, including a self-reported seizure in December, 2025.  [facility] appt has been cancelled. RN informed patient that Dr. Legrand' RN will contact her about scheduling her procedure at hospital.

## 2025-01-13 ENCOUNTER — Telehealth: Payer: Self-pay | Admitting: Neurology

## 2025-01-13 NOTE — Telephone Encounter (Signed)
 Communicated with clinic nursing for patient to be on wait list for hospital procedures.  H Danis

## 2025-01-13 NOTE — Telephone Encounter (Signed)
 Corean RAMAN., CMA, contacted and added to waiting list.    Thank you

## 2025-01-13 NOTE — Telephone Encounter (Signed)
 Patient cancelled appointment due to requesting to switch neurologist.  Patient requesting to be switched to Dr. Vear.

## 2025-01-13 NOTE — Telephone Encounter (Signed)
 Dr. Legrand  Would you like patient seen in the office by APP prior to scheduling colonoscopy?  Last seen in the office in 2018.

## 2025-01-13 NOTE — Telephone Encounter (Addendum)
 Contacted patient to relay Dr. Duncan advised patient to continue care with Dr. Gregg.  Patient ask to reschedule appointment with Dr. Gregg on 08/02/25 at 2:45pm

## 2025-01-18 ENCOUNTER — Encounter: Admitting: Gastroenterology

## 2025-01-20 ENCOUNTER — Ambulatory Visit: Admitting: Neurology

## 2025-02-07 ENCOUNTER — Ambulatory Visit

## 2025-02-16 ENCOUNTER — Ambulatory Visit (HOSPITAL_COMMUNITY): Admitting: Psychiatry

## 2025-03-15 ENCOUNTER — Ambulatory Visit (HOSPITAL_COMMUNITY)

## 2025-08-02 ENCOUNTER — Ambulatory Visit: Admitting: Neurology
